# Patient Record
Sex: Female | Born: 1945 | Race: White | Hispanic: No | Marital: Married | Smoking: Never smoker
Health system: Southern US, Community
[De-identification: ages and names within clinical notes are randomized; demographics above are authoritative.]

## PROBLEM LIST (undated history)

## (undated) DIAGNOSIS — I313 Pericardial effusion (noninflammatory): Secondary | ICD-10-CM

## (undated) DIAGNOSIS — N390 Urinary tract infection, site not specified: Secondary | ICD-10-CM

## (undated) DIAGNOSIS — R112 Nausea with vomiting, unspecified: Secondary | ICD-10-CM

## (undated) DIAGNOSIS — E559 Vitamin D deficiency, unspecified: Secondary | ICD-10-CM

## (undated) DIAGNOSIS — M810 Age-related osteoporosis without current pathological fracture: Secondary | ICD-10-CM

## (undated) DIAGNOSIS — E782 Mixed hyperlipidemia: Secondary | ICD-10-CM

## (undated) DIAGNOSIS — E039 Hypothyroidism, unspecified: Secondary | ICD-10-CM

## (undated) DIAGNOSIS — Z9889 Other specified postprocedural states: Secondary | ICD-10-CM

## (undated) DIAGNOSIS — M1990 Unspecified osteoarthritis, unspecified site: Secondary | ICD-10-CM

## (undated) DIAGNOSIS — F419 Anxiety disorder, unspecified: Secondary | ICD-10-CM

## (undated) DIAGNOSIS — J189 Pneumonia, unspecified organism: Secondary | ICD-10-CM

## (undated) DIAGNOSIS — C9210 Chronic myeloid leukemia, BCR/ABL-positive, not having achieved remission: Secondary | ICD-10-CM

## (undated) DIAGNOSIS — R9431 Abnormal electrocardiogram [ECG] [EKG]: Secondary | ICD-10-CM

## (undated) DIAGNOSIS — Z8711 Personal history of peptic ulcer disease: Secondary | ICD-10-CM

## (undated) DIAGNOSIS — I3139 Other pericardial effusion (noninflammatory): Secondary | ICD-10-CM

## (undated) DIAGNOSIS — D72829 Elevated white blood cell count, unspecified: Secondary | ICD-10-CM

## (undated) DIAGNOSIS — J90 Pleural effusion, not elsewhere classified: Secondary | ICD-10-CM

## (undated) DIAGNOSIS — R32 Unspecified urinary incontinence: Secondary | ICD-10-CM

## (undated) HISTORY — PX: PARTIAL HYSTERECTOMY: SHX80

## (undated) HISTORY — DX: Mixed hyperlipidemia: E78.2

## (undated) HISTORY — DX: Pericardial effusion (noninflammatory): I31.3

## (undated) HISTORY — DX: Hypothyroidism, unspecified: E03.9

## (undated) HISTORY — DX: Age-related osteoporosis without current pathological fracture: M81.0

## (undated) HISTORY — DX: Vitamin D deficiency, unspecified: E55.9

## (undated) HISTORY — DX: Urinary tract infection, site not specified: N39.0

## (undated) HISTORY — DX: Pleural effusion, not elsewhere classified: J90

## (undated) HISTORY — DX: Chronic myeloid leukemia, BCR/ABL-positive, not having achieved remission: C92.10

## (undated) HISTORY — DX: Personal history of peptic ulcer disease: Z87.11

## (undated) HISTORY — PX: LASIK: SHX215

## (undated) HISTORY — DX: Elevated white blood cell count, unspecified: D72.829

## (undated) HISTORY — DX: Abnormal electrocardiogram (ECG) (EKG): R94.31

## (undated) HISTORY — DX: Other pericardial effusion (noninflammatory): I31.39

## (undated) HISTORY — DX: Anxiety disorder, unspecified: F41.9

## (undated) HISTORY — PX: NOSE SURGERY: SHX723

---
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
## 1999-11-27 ENCOUNTER — Encounter: Payer: Self-pay | Admitting: Obstetrics and Gynecology

## 1999-11-27 ENCOUNTER — Encounter: Admission: RE | Admit: 1999-11-27 | Discharge: 1999-11-27 | Payer: Self-pay | Admitting: Obstetrics and Gynecology

## 1999-12-18 ENCOUNTER — Encounter: Payer: Self-pay | Admitting: Obstetrics and Gynecology

## 1999-12-18 ENCOUNTER — Encounter: Admission: RE | Admit: 1999-12-18 | Discharge: 1999-12-18 | Payer: Self-pay | Admitting: Obstetrics and Gynecology

## 2000-12-30 ENCOUNTER — Encounter: Admission: RE | Admit: 2000-12-30 | Discharge: 2000-12-30 | Payer: Self-pay | Admitting: Obstetrics and Gynecology

## 2000-12-30 ENCOUNTER — Encounter: Payer: Self-pay | Admitting: Obstetrics and Gynecology

## 2001-05-07 ENCOUNTER — Emergency Department (HOSPITAL_COMMUNITY): Admission: EM | Admit: 2001-05-07 | Discharge: 2001-05-07 | Payer: Self-pay

## 2002-01-25 ENCOUNTER — Encounter: Admission: RE | Admit: 2002-01-25 | Discharge: 2002-01-25 | Payer: Self-pay | Admitting: Obstetrics and Gynecology

## 2002-01-25 ENCOUNTER — Encounter: Payer: Self-pay | Admitting: Obstetrics and Gynecology

## 2003-02-25 ENCOUNTER — Encounter: Admission: RE | Admit: 2003-02-25 | Discharge: 2003-02-25 | Payer: Self-pay | Admitting: Obstetrics and Gynecology

## 2003-02-25 ENCOUNTER — Encounter: Payer: Self-pay | Admitting: Obstetrics and Gynecology

## 2004-01-14 HISTORY — PX: COLONOSCOPY: SHX174

## 2004-01-17 ENCOUNTER — Ambulatory Visit (HOSPITAL_COMMUNITY): Admission: RE | Admit: 2004-01-17 | Discharge: 2004-01-17 | Payer: Self-pay | Admitting: Internal Medicine

## 2004-04-07 ENCOUNTER — Ambulatory Visit (HOSPITAL_COMMUNITY): Admission: RE | Admit: 2004-04-07 | Discharge: 2004-04-07 | Payer: Self-pay | Admitting: Obstetrics and Gynecology

## 2005-04-20 ENCOUNTER — Ambulatory Visit (HOSPITAL_COMMUNITY): Admission: RE | Admit: 2005-04-20 | Discharge: 2005-04-20 | Payer: Self-pay | Admitting: Obstetrics and Gynecology

## 2006-04-26 ENCOUNTER — Ambulatory Visit (HOSPITAL_COMMUNITY): Admission: RE | Admit: 2006-04-26 | Discharge: 2006-04-26 | Payer: Self-pay | Admitting: Obstetrics and Gynecology

## 2007-05-22 ENCOUNTER — Ambulatory Visit (HOSPITAL_COMMUNITY): Admission: RE | Admit: 2007-05-22 | Discharge: 2007-05-22 | Payer: Self-pay | Admitting: Obstetrics and Gynecology

## 2007-08-10 ENCOUNTER — Ambulatory Visit (HOSPITAL_COMMUNITY): Admission: RE | Admit: 2007-08-10 | Discharge: 2007-08-10 | Payer: Self-pay | Admitting: Obstetrics and Gynecology

## 2007-09-22 ENCOUNTER — Emergency Department (HOSPITAL_COMMUNITY): Admission: EM | Admit: 2007-09-22 | Discharge: 2007-09-22 | Payer: Self-pay | Admitting: Emergency Medicine

## 2008-05-23 ENCOUNTER — Ambulatory Visit (HOSPITAL_COMMUNITY): Admission: RE | Admit: 2008-05-23 | Discharge: 2008-05-23 | Payer: Self-pay | Admitting: Obstetrics and Gynecology

## 2008-07-29 ENCOUNTER — Ambulatory Visit (HOSPITAL_COMMUNITY): Admission: RE | Admit: 2008-07-29 | Discharge: 2008-07-29 | Payer: Self-pay | Admitting: Family Medicine

## 2008-11-05 ENCOUNTER — Ambulatory Visit (HOSPITAL_COMMUNITY): Admission: RE | Admit: 2008-11-05 | Discharge: 2008-11-05 | Payer: Self-pay | Admitting: Pulmonary Disease

## 2009-05-27 ENCOUNTER — Ambulatory Visit (HOSPITAL_COMMUNITY): Admission: RE | Admit: 2009-05-27 | Discharge: 2009-05-27 | Payer: Self-pay | Admitting: Obstetrics and Gynecology

## 2009-08-13 ENCOUNTER — Ambulatory Visit (HOSPITAL_COMMUNITY): Admission: RE | Admit: 2009-08-13 | Discharge: 2009-08-13 | Payer: Self-pay | Admitting: Internal Medicine

## 2010-01-12 ENCOUNTER — Ambulatory Visit (HOSPITAL_COMMUNITY): Admission: RE | Admit: 2010-01-12 | Discharge: 2010-01-12 | Payer: Self-pay | Admitting: Pulmonary Disease

## 2010-01-27 ENCOUNTER — Encounter (HOSPITAL_COMMUNITY): Admission: RE | Admit: 2010-01-27 | Discharge: 2010-02-26 | Payer: Self-pay | Admitting: Pulmonary Disease

## 2010-02-10 HISTORY — PX: ESOPHAGOGASTRODUODENOSCOPY: SHX1529

## 2010-02-11 DIAGNOSIS — R110 Nausea: Secondary | ICD-10-CM

## 2010-02-11 DIAGNOSIS — Z87448 Personal history of other diseases of urinary system: Secondary | ICD-10-CM | POA: Insufficient documentation

## 2010-02-11 DIAGNOSIS — R1084 Generalized abdominal pain: Secondary | ICD-10-CM

## 2010-02-11 DIAGNOSIS — R109 Unspecified abdominal pain: Secondary | ICD-10-CM | POA: Insufficient documentation

## 2010-02-11 DIAGNOSIS — E039 Hypothyroidism, unspecified: Secondary | ICD-10-CM | POA: Insufficient documentation

## 2010-02-11 DIAGNOSIS — R112 Nausea with vomiting, unspecified: Secondary | ICD-10-CM | POA: Insufficient documentation

## 2010-02-12 ENCOUNTER — Ambulatory Visit: Payer: Self-pay | Admitting: Gastroenterology

## 2010-02-12 DIAGNOSIS — R1013 Epigastric pain: Secondary | ICD-10-CM | POA: Insufficient documentation

## 2010-02-12 DIAGNOSIS — R1011 Right upper quadrant pain: Secondary | ICD-10-CM | POA: Insufficient documentation

## 2010-02-13 ENCOUNTER — Ambulatory Visit (HOSPITAL_COMMUNITY): Admission: RE | Admit: 2010-02-13 | Discharge: 2010-02-13 | Payer: Self-pay | Admitting: Gastroenterology

## 2010-02-13 ENCOUNTER — Ambulatory Visit: Payer: Self-pay | Admitting: Gastroenterology

## 2010-02-17 ENCOUNTER — Telehealth (INDEPENDENT_AMBULATORY_CARE_PROVIDER_SITE_OTHER): Payer: Self-pay

## 2010-05-14 ENCOUNTER — Encounter: Payer: Self-pay | Admitting: Gastroenterology

## 2010-05-20 ENCOUNTER — Ambulatory Visit: Payer: Self-pay | Admitting: Gastroenterology

## 2010-05-20 DIAGNOSIS — K279 Peptic ulcer, site unspecified, unspecified as acute or chronic, without hemorrhage or perforation: Secondary | ICD-10-CM | POA: Insufficient documentation

## 2010-06-09 ENCOUNTER — Ambulatory Visit (HOSPITAL_COMMUNITY): Admission: RE | Admit: 2010-06-09 | Discharge: 2010-06-09 | Payer: Self-pay | Admitting: Obstetrics and Gynecology

## 2010-09-16 ENCOUNTER — Telehealth (INDEPENDENT_AMBULATORY_CARE_PROVIDER_SITE_OTHER): Payer: Self-pay

## 2010-11-17 ENCOUNTER — Ambulatory Visit: Payer: Self-pay | Admitting: Gastroenterology

## 2011-01-04 ENCOUNTER — Encounter: Payer: Self-pay | Admitting: Family Medicine

## 2011-01-14 NOTE — Progress Notes (Signed)
Summary: Pt says she can't take Nexium  Phone Note Call from Patient   Caller: Patient Summary of Call: Pt called and said she started Nexium, prescribed by Tana Coast, PA, last week. She took it Sat, Sun, Mon, and this AM. Each time she said she gets very jittery, has severe abd cramps and some nausea not long after taking. She feels certain this is coming from the Nexium. She also said [name] gave her Rx for Omeprazole and she is not sure if she should have it filled or not. She said she was told not to eat fried foods, no caffeine, but wasn't told what she can have. Please advise. Initial call taken by: Cloria Spring LPN,  February 17, 2010 9:29 AM     Appended Document: Pt says she can't take Nexium Stop Nexium. I would probably try different PPI than omeprazole (similar to Nexium). Offer samples of Dexilant to take one daily. If tolerated, then she needs to get RX filled (please call in Dexilant 60mg  by mouth 30 mins before breakfast daily #30 and 11 refills). Please provide her with diet for gastritis/ulcers.  Appended Document: Pt says she can't take Nexium Pt informed. Declined samples...Marland KitchenMarland Kitchentime-wise can't make it today and wants available to begin tomorrow. Rx called to [facility].Diet mailed.  Appended Document: Pt says she can't take Nexium Please call pt. Her biopsies show chronic irritation of her stomach. She should avoid gastric irritants. Otherwise she may eat what she likes. Take Dexilant daily indefinitely.  Appended Document: Pt says she can't take Nexium LMOM to call.  Appended Document: Pt says she can't take Nexium Pt  informed.

## 2011-01-14 NOTE — Assessment & Plan Note (Signed)
Summary: abd pain,nausea,glu   Visit Type:  Initial Consult Referring Provider:  Juanetta Martinez Primary Care Provider:  Juanetta Martinez  Chief Complaint:  abd pain/ nausea.  History of Present Illness: [name] is a pleasant 65 year old Caucasian female, patient of Dr. Juanetta Martinez, who presents for further evaluation of nausea and abdominal pain. She states she has been having symptoms for several months. Symptoms have been worse since January. Initially she noted pain in the right upper quadrant but now more in the epigastrium. She has nausea but no vomiting. The pain can occur without meals but usually related to meals. She describes a burning twisting-type epigastric pain. Denies any typical heartburn. No difficulty swallowing. Bowel movements normal for her which include occasional loose stools related to certain foods. Denies any blood in the stool or melena. She complains of a bitter taste in her mouth. The right upper quadrant pain is mostly in the right flank/right lower rib cage margin. It is worse with movement. She denies any hematuria. She has had history of multiple UTIs. Denies any recent dysuria.  Abdominal ultrasound recently was unremarkable. HIDA scan showed gallbladder ejection fraction of 75%.  Denies NSAID use. No recent PPIs. She has fear of endoscopy, complains of nausea and vomiting related to anesthesia and fasting.  Current Medications (verified): 1)  Estradiol 0.5 Mg Tabs (Estradiol) .... Daily 2)  Calcium 600 Mg Plus D .... Take 1 Tablet By Mouth Two Times A Day 3)  Tums .... As Needed  Allergies (verified): 1)  ! Codeine 2)  ! Sulfa  Past History:  Past Medical History: Colonoscopy, 2/05, Dr. Karilyn Martinez --> few small diverticula at sigmoid colon  Hypothyroidism, not on medications for two years, followed by Dr. Germaine Ripp Martinez   Past Surgical History: Partial hysterectomy  Family History: No FH of CRC, liver, chronic GI illnesses Father, deceased, lung cancer Mother,  COPD  Social History: Married. Two children. Retired. Never smoked. No alcohol. No drug.  Review of Systems General:  Denies fever, chills, sweats, anorexia, and weight loss. Eyes:  Denies vision loss. ENT:  Complains of nasal congestion; denies sore throat, hoarseness, and difficulty swallowing. CV:  Denies chest pains, angina, palpitations, dyspnea on exertion, and peripheral edema. Resp:  Denies dyspnea at rest, dyspnea with exercise, cough, and sputum. GI:  See HPI. GU:  Denies urinary burning and blood in urine. MS:  Denies joint pain / LOM. Derm:  Denies rash and itching. Neuro:  Denies weakness, paralysis, frequent headaches, memory loss, and confusion. Psych:  Denies depression and anxiety. Endo:  Denies unusual weight change. Heme:  Denies bruising and bleeding. Allergy:  Denies hives and rash.  Vital Signs:  Patient profile:   65 year old female Height:      67 inches Weight:      216 pounds BMI:     33.95 Temp:     97.7 degrees F oral Pulse rate:   64 / minute BP sitting:   142 / 80  (left arm) Cuff size:   large  Vitals Entered By: Andrea Martinez (February 12, 1609 2:27 PM)  Serial Vital Signs/Assessments:  Time      Position  BP       Pulse  Resp  Temp     By 3:27 PM             128/78                         Andrea Battles.  Dixon Martinez   Physical Exam  General:  Well developed, well nourished, no acute distress. Head:  Normocephalic and atraumatic. Eyes:  Conjunctivae pink, no scleral icterus.  Mouth:  Oropharyngeal mucosa moist, pink.  No lesions, erythema or exudate.    Neck:  Supple; no masses or thyromegaly. Lungs:  Clear throughout to auscultation. Heart:  Regular rate and rhythm; no murmurs, rubs,  or bruits. Abdomen:  Bowel sounds normal.  Abdomen is soft, obese. Moderate epigastric tenderness.  No rebound or guarding.  She also has tenderness with palpation of right lower ribcage. No hepatosplenomegaly, masses or hernias.  No abdominal bruits.   Extremities:  No clubbing, cyanosis, edema or deformities noted. Neurologic:  Alert and  oriented x4;  grossly normal neurologically. Skin:  Intact without significant lesions or rashes. Cervical Nodes:  No significant cervical adenopathy. Psych:  Alert and cooperative. Normal mood and affect.  Impression & Recommendations:  Problem # 1:  EPIGASTRIC PAIN (ICD-789.06)  Several month h/o epig pain, nausea. GB w/u negative. No typical heartburn symptoms. Ddx includes atypical GERD, gastritis, PUD, dyspepsia. EGD tomorrow. Patient c/o N/V related to anesthesia. Consider preop antiemetic. EGD to be performed in near future.  Risks, alternatives, benefits including but not limited to risk of reaction to medications, bleeding, infection, and perforation addressed.  Patient voiced understanding and verbal consent obtained.  Begin Nexium 40mg  by mouth daily #20 samples provided.      Orders: Consultation Level III (16109)  Problem # 2:  ABDOMINAL PAIN RIGHT UPPER QUADRANT (ICD-789.01)  Really Right lower rib cage margin pain. ?musculoskeletal.  Orders: Consultation Level III (60454)   I would like to thank Dr. Juanetta Martinez for allowing [place] to take part in the care of this nice patient.  Appended Document: abd pain,nausea,glu JAN 2011 ABD US-NL LIVER AND GB

## 2011-01-14 NOTE — Medication Information (Signed)
Summary: Tax adviser   Imported By: Diana Eves 05/14/2010 13:24:02  _____________________________________________________________________  External Attachment:    Type:   Image     Comment:   External Document  Appended Document: RX Folder What does this mean? She was given year supply in 3/11.  Appended Document: RX Water quality scientist with [employer]. They had recorded 2 refills instead of 11. He is adding number 9 more.

## 2011-01-14 NOTE — Assessment & Plan Note (Signed)
Summary: PUD   Visit Type:  Follow-up Visit [facility]:  Juanetta Gosling, M.D.  Chief Complaint:  PUD.  History of Present Illness: Can take something mild for indigestion like TUMS? One mo ago having problems-UTIs. Saw a Urologist-Rx:Abx. Saw Dr. Elana Alm Elmiron/TMP. Took Dexilant and no pain on right side. Rare twinge onthe right side. No doubling over belly pain. Thinks Elmiron keeps her awake due to need to drinking so much water.   No black stool or BRBPR.  Current Medications (verified): 1)  Estradiol 0.5 Mg Tabs (Estradiol) .... Daily 2)  Calcium 600 Mg Plus D .... Take 1 Tablet By Mouth Two Times A Day 3)  Tylenol .... As Needed 4)  Vitamin D 2000iu .... Take 1 Tablet By Mouth Once A Day 5)  Trimethoprim 100 Mg Tabs (Trimethoprim) .... Take Two Tablets Daily 6)  Elmiron 100 Mg Caps (Pentosan Polysulfate Sodium) .... Take 1 Tablet By Mouth Three Times A Day  Allergies (verified): 1)  ! Codeine 2)  ! Sulfa 3)  ! * Nexium  Past History:  Past Medical History: GASTRIC ULCERS EGD MAR 2011 Colonoscopy, 2/05, Dr. Karilyn Cota **few small diverticula at sigmoid colon Hypothyroidism, not on medications for two years, followed by Dr. [name]   Past Surgical History: Reviewed history from 02/12/2010 and no changes required. Partial hysterectomy  Vital Signs:  Patient profile:   65 year old female Height:      67 inches Weight:      177 pounds BMI:     27.82 Temp:     97.8 degrees F oral Pulse rate:   80 / minute BP sitting:   118 / 68  (left arm)  Vitals Entered By: Cloria Spring LPN (November 17, 2010 8:54 AM)  Physical Exam  General:  Well developed, well nourished, no acute distress. Head:  Normocephalic and atraumatic. Lungs:  Clear throughout to auscultation. Heart:  Regular rate and rhythm; no murmurs. Abdomen:  Soft, nontender and nondistended. Normal bowel sounds.  Impression & Recommendations:  Problem # 1:  PUD (ICD-533.90) Assessment  Improved Use Dexilant as needed. Use TUMS 30 minutes after meals. OPV IN 6-12 MOS.  CC: PCP  Appended Document: PUD 6-12 MON F/U OPV IS IN THE COMPUTER  Appended Document: Orders Update    Clinical Lists Changes  Orders: Added new Service order of Est. Patient Level II (16109) - Signed

## 2011-01-14 NOTE — Progress Notes (Signed)
Summary: phone note/ right side pain  Phone Note Call from Patient   Caller: Patient Summary of Call: Pt called and said she has been having some pain on her right side since yesterday. Sometimes it is severe. She had EGD done in 02/2010. She said [name] told her she could stop taking the Dexilant if she felt like they were no longer doing any good. She last took one on 08/22/2010. Had been doing well until the last couple of days. She is also on antibiotics for UTI from Dr. Juanetta Gosling. She has a very bitter taste in her mouth that she has had for a couple of days. I told her [name] is not available today. I will send message to [place] but since it is late i might not hear back from him until tomorrow. I advised her to speak with Dr. Juanetta Gosling if it is very bad and of course if pain becomes severe to go to the ED.  Initial call taken by: Cloria Spring LPN,  September 16, 2010 3:54 PM     Appended Document: phone note/ right side pain Would resume Dexlant 60 mg daily x 2 weeks only (prescription ok if needeed) and see if this helps; howver, if pain worsens would get in touch with Dr. Juanetta Gosling or go to ED  Appended Document: phone note/ right side pain Pt was informed.   Appended Document: PUD 6-12 MON F/U OPV IS IN THE COMPUTER

## 2011-01-14 NOTE — Assessment & Plan Note (Signed)
Summary: PUD, RIGHT RIB PAIN   Visit Type:  Follow-up Visit Primary Care Provider:  Juanetta Gosling, M.D.  Chief Complaint:  F/U procedure.  History of Present Illness: Feels discomfort uner her right rib. Thinks she passed a kidney stone about 2 weeks. Having urinary frequency and hurting in fronts of legs. No diagnosis w/ kidney stone. Been evaluated for GB problems. Dexilant is $85 co-pay. Lost 26 lbs since [facility] 2011. Has episodes <1x/week. Achy on right rib cage side and pain in legs. Sx last less than an hour. No heavy lifting, house work, or gardening. No car accident. 5-6 weeks ago: coughing a lot, almost to the point of heaving. No pain in front of abdomen. Eating grilled, baked sweet potato, green beans, and oatmeal.   Current Medications (verified): 1)  Estradiol 0.5 Mg Tabs (Estradiol) .... Daily 2)  Calcium 600 Mg Plus D .... Take 1 Tablet By Mouth Two Times A Day 3)  Tylenol .... As Needed 4)  Dexilant 60 Mg Cpdr (Dexlansoprazole) .... Take 1 Tablet By Mouth Once A Day  Allergies (verified): 1)  ! Codeine 2)  ! Sulfa 3)  ! * Nexium  Past History:  Past Medical History: GASTRI ULCERS EGD MAR 2011 Colonoscopy, 2/05, Dr. Karilyn Cota **few small diverticula at sigmoid colon Hypothyroidism, not on medications for two years, followed by Dr. [name]   Vital Signs:  Patient profile:   65 year old female Height:      67 inches Weight:      190 pounds BMI:     29.87 Temp:     98.5 degrees F oral Pulse rate:   72 / minute BP sitting:   140 / 82  (left arm) Cuff size:   regular  Vitals Entered By: Cloria Spring LPN (May 21, 4539 2:09 PM)  Physical Exam  General:  Well developed, well nourished, no acute distress. Head:  Normocephalic and atraumatic. Lungs:  Clear throughout to auscultation. Heart:  Regular rate and rhythm; no murmurs. Abdomen:  Soft, nontender and nondistended. Normal bowel sounds. Msk:  TTP over right rib cage. Reproduces complaint. Extremities:  No edema or  deformities noted.  Impression & Recommendations:  Problem # 1:  PUD (ICD-533.90) Assessment Improved  w/ weight loss due to diet modification and stress. Consider pain management and chest wall injections if chest wall pain persists. Continue Dexilant for 3 months then prn. Rx assistance card given. #30 Dexilant given. OPV i 6 mos. Pt may liberalize diet. Explained a weight of 190 lbs is a good weight for her.  CC: PCP  Orders: Est. Patient Level II ([id number])  Problem # 2:  SCREENING, COLON CANCER (ICD-V76.51) Assessment: Comment Only TCS in 2015.  Appended Document: PUD, RIGHT RIB PAIN REMINDER IN COMPUTER

## 2011-01-14 NOTE — Letter (Signed)
Summary: EGD ORDER  EGD ORDER   Imported By: Ave Filter 02/12/2010 16:00:50  _____________________________________________________________________  External Attachment:    Type:   Image     Comment:   External Document

## 2011-03-10 ENCOUNTER — Encounter: Payer: Self-pay | Admitting: Gastroenterology

## 2011-03-10 ENCOUNTER — Ambulatory Visit (INDEPENDENT_AMBULATORY_CARE_PROVIDER_SITE_OTHER): Payer: Self-pay | Admitting: Gastroenterology

## 2011-03-10 VITALS — BP 138/76 | HR 76 | Temp 97.2°F | Ht 66.0 in | Wt 178.5 lb

## 2011-03-10 DIAGNOSIS — R1011 Right upper quadrant pain: Secondary | ICD-10-CM

## 2011-03-10 NOTE — Progress Notes (Signed)
Reviewed by R. Michael Mairin Lindsley, MD FACP FACG 

## 2011-03-10 NOTE — Patient Instructions (Signed)
#  1 please call us tomorrow with a progress report. If you develop worsening abdominal pain, vomiting, fever should call us immediately or go to the nearest ER. #2 if your pain is persistent would recommend a CT of the abdomen and pelvis with contrast. #3 if you develop a rash, please contact her PCP for treatment of shingles. #4 continue your Dexilant 60 mg daily. We do not have samples at this time he may contact her office next week to obtain more samples.

## 2011-03-10 NOTE — Progress Notes (Signed)
Primary Care Physician:  Fredirick Maudlin, MD  Chief Complaint  Patient presents with  . Abdominal Pain    RUQ, hurts worse 2 hours after meal. feels like knife twisting in side.     HPI:  Andrea Martinez is a 65 y.o. female here for urgent visit. This weekend severe RUQ pain. Never this bad before. Avoiding spicy foods and gastric irritants since dx with PUD in 5/11. Started Dexilant again two weeks ago. Took for 3 months before but was advised she could stop in last 12/11. Still with real bitter taste in mouth. Different pain from when had ulcers. Nausea. BM normal, sometimes looser (with increased fiber). No melena, brbpr. No NSAIDS. Taking tylenol for the pain. No fever, sob, cough, congestion. No rash. No dysuria, hematuria. No UTIs in last two months. She has lost 50 lbs in last one year, intentionally.  Negative gb w/u in 2/11.   Past Medical History  Diagnosis Date  . Gastric ulcer 2011    EGD, 5/11  . Hypothyroidism     not on meds, followed by Dr. Payam Gribble Dales    Past Surgical History  Procedure Date  . Esophagogastroduodenoscopy 02/2010    gastric ulcers  . Colonoscopy 01/2004    [facility], few small tics  . Partial hysterectomy     Current Outpatient Prescriptions  Medication Sig Dispense Refill  . Calcium-Vitamin D (CALTRATE 600 PLUS-VIT D PO) Take 2 capsules by mouth daily.        . Cholecalciferol (VITAMIN D) 2000 UNITS CAPS Take 1 capsule by mouth daily.        Marland Kitchen dexlansoprazole (DEXILANT) 60 MG capsule Take 60 mg by mouth daily as needed.        Marland Kitchen estradiol (ESTRACE) 0.5 MG tablet Take 0.5 mg by mouth daily.                       Allergies as of 03/10/2011 - Review Complete 03/10/2011  Allergen Reaction Noted  . Codeine  02/11/2010  . Esomeprazole magnesium  05/20/2010  . Sulfonamide derivatives  02/11/2010    Family History  Problem Relation Age of Onset  . Lung cancer Father   . COPD Mother   . Colon cancer Neg Hx     History   Social History  .  Marital Status: Married    Spouse Name: N/A    Number of Children: 2  . Years of Education: N/A   Occupational History  . retired    Social History Main Topics  . Smoking status: Never Smoker   . Smokeless tobacco: Never Used  . Alcohol Use: No  . Drug Use: No  . Sexually Active: Yes -- Female partner(s)    Birth Control/ Protection: None     Review of Systems: Gen: Denies any fever, chills, sweats, anorexia, fatigue, weakness, malaise, unintentional weight loss, and sleep disorder CV: Denies chest pain, angina, palpitations, syncope, orthopnea, PND, peripheral edema, and claudication. Resp: Denies dyspnea at rest, dyspnea with exercise, cough, sputum, wheezing, coughing up blood, and pleurisy. GI: see HPI GU : Denies urinary burning, blood in urine, urinary frequency, urinary hesitancy, nocturnal urination, and urinary incontinence. Derm: Denies rash, itching.    Physical Exam: BP 138/76  Pulse 76  Temp 97.2 F (36.2 C)  Ht 5\' 6"  (1.676 m)  Wt 178 lb 8 oz (80.967 kg)  BMI 28.81 kg/m2 General:   Alert,  Well-developed, well-nourished, pleasant and cooperative in NAD Head:  Normocephalic and  atraumatic. Eyes:  Sclera clear, no icterus.   Conjunctiva pink. Nose:  No deformity, discharge,  or lesions. Mouth:  No deformity or lesions, dentition normal. Neck:  Supple; no masses or thyromegaly. Lungs:  Clear throughout to auscultation.   No wheezes, crackles, or rhonchi. No acute distress. Heart:  Regular rate and rhythm; no murmurs, clicks, rubs,  or gallops. Abdomen:  Soft, mild tenderness above right iliac crest and right flank, over right lower ribcage. Couple of small red spots but no obvious rash. Nondistended. No masses, hepatosplenomegaly or hernias noted. Normal bowel sounds, without guarding, and without rebound.     Msk:  Symmetrical without gross deformities. Normal posture. Extremities:  Without clubbing or edema. Neurologic:  Alert and  oriented x4;  grossly normal  neurologically. Skin:  Intact without significant lesions or rashes. Psych:  Alert and cooperative. Normal mood and affect.

## 2011-03-10 NOTE — Assessment & Plan Note (Signed)
Patient states this pain is different than before although based on her description it seems very similar to what she had last year. She states she never experienced anything this severe before however.. Sometimes related to food. Definitely worse with movement and applying pressure to the area. She has a history of peptic ulcer disease last year when she was experiencing some his pain as well. She has previously been felt to have chest wall pain and [name] has considered sending her for injections. Gallbladder workup one year ago was negative. Mild improvement since resuming Dexilant 2 weeks ago. Couple of small erythematous areas noted in the area (under bra) and this may very well be the beginnings of shingles however no obvious rash at this point. She is to continue to watch for development of rash and if she does develop a rash she should contact her PCP for management. I offered her CT the abdomen and pelvis with contrast at this point she would like to hold off. She plans to call tomorrow with a progress report. In the meantime if she develops fever, increased pain or vomiting she should let us know or go to the nearest emergency department. She'll continue Dexilant for now. We were out of samples but she may call next week for some samples if needed.

## 2011-03-11 ENCOUNTER — Telehealth: Payer: Self-pay

## 2011-03-11 NOTE — Telephone Encounter (Signed)
Noted. Patient will call if persisting abdominal pain and at that time we'll consider further workup via CT. Otherwise office visit as planned in 6 months.

## 2011-03-11 NOTE — Telephone Encounter (Signed)
OV was cx for 03/19/11 per pt request and 6 months fu is on recall to fu in Sept. 2012

## 2011-03-11 NOTE — Telephone Encounter (Signed)
Pt called with progress report. She stated her pain was not any worse. She does not want CT right now and wants to cancel her OV appt on April 9th. She also stated there were 2 medications on her AVM that she wasn't taking anymore and asked that I take them off. I have marked them as not taking in this update. Pt wanted to know if she still needed to come in to see SLF in June. Per LSL pt needs to come back from now. Pt stated she may need refill on her dexilant before then. Asked pt to call pharmacy when she needed refill.

## 2011-03-15 ENCOUNTER — Ambulatory Visit: Payer: Self-pay | Admitting: Gastroenterology

## 2011-03-17 ENCOUNTER — Telehealth: Payer: Self-pay

## 2011-03-17 NOTE — Telephone Encounter (Signed)
Pt called for samples of Dexilant (was told to call this week since we did not have samples when she was in the office last week). #15 at front for pick-up.

## 2011-03-17 NOTE — Telephone Encounter (Signed)
okay

## 2011-03-22 ENCOUNTER — Ambulatory Visit: Payer: Self-pay | Admitting: Gastroenterology

## 2011-04-30 NOTE — Op Note (Signed)
Andrea Martinez, Andrea Martinez                           ACCOUNT NO.:  [id number]   MEDICAL RECORD NO.:  [id number]                   PATIENT TYPE:  AMB   LOCATION:  DAY                                  FACILITY:  [facility]   PHYSICIAN:  Lionel December, M.D.                 DATE OF BIRTH:  [birth year]   DATE OF PROCEDURE:  01/17/2004  DATE OF DISCHARGE:                                 OPERATIVE REPORT   PROCEDURE:  Total colonoscopy.   ENDOSCOPIST:  Lionel December, M.D.   INDICATIONS:  Andrea Martinez is a 64 year old Caucasian female who was recently noted  to have heme-positive stool.  She has no GI symptoms.  Family history is  negative for colorectal carcinoma.  She is undergoing diagnostic  colonoscopy.  The procedure and risks were reviewed with the patient and  informed consent was obtained.   PREOPERATIVE MEDICATIONS:  Demerol 50 mg IV and Versed 6 mg IV in divided  dose.   FINDINGS:  Procedure performed in endoscopy suite.  The patient's vital  signs and O2 saturation were monitored during the procedure and remained  stable.  The patient was placed in the left lateral recumbent position and  rectal examination was performed.  No abnormality noted on external or  digital exam.   Olympus videoscope was placed in the rectum and advanced under vision into  the sigmoid colon where there were a few small diverticula.  The scope was  advanced into cecum.  Preparation was excellent except she had some stool  coating of her cecum which was washed away.  Cecal landmarks i.e.  appendiceal orifice and ileocecal valve were well seen; and pictures were  taken for the record.  As the scope was withdrawn colonic mucosa was  carefully examined and was normal throughout.  The rectal mucosa similarly  was normal.   The scope was retroflexed to examine the anorectal junction which was  unremarkable.  The endoscope was straightened and withdrawn.  The patient  tolerated the procedure well.   FINAL DIAGNOSIS:  A few  small diverticula at the sigmoid colon; otherwise  normal examination.   RECOMMENDATIONS:  1. High fiber diet, plus Citrucel 1 tablespoonful daily.  2. She will have her Hemoccult and H&H on her next visit with her physician     in 3 months.  3. Unless her stool is noted to be heme-positive or she has low H&H she will     not need any further evaluation.  4. She was given a prescription for Anusol HC cream that she could use on a     p.r.n. basis for anal, perianal irritation.      ___________________________________________  Lionel December, M.D.   NR/MEDQ  D:  01/17/2004  T:  01/17/2004  Job:  [id number]   cc:   S. Kyra Manges, M.D.  [phone] [address]  [place]  [place] [address]  Fax: [phone]   Gaynelle Cage, MD  [phone] [address]  [place]  [place] [address]  Fax: [phone]

## 2011-07-08 ENCOUNTER — Encounter: Payer: Self-pay | Admitting: Orthopedic Surgery

## 2011-07-08 ENCOUNTER — Ambulatory Visit (INDEPENDENT_AMBULATORY_CARE_PROVIDER_SITE_OTHER): Payer: Medicare Other | Admitting: Orthopedic Surgery

## 2011-07-08 VITALS — Ht 65.0 in | Wt 176.0 lb

## 2011-07-08 DIAGNOSIS — R2230 Localized swelling, mass and lump, unspecified upper limb: Secondary | ICD-10-CM | POA: Insufficient documentation

## 2011-07-08 DIAGNOSIS — R229 Localized swelling, mass and lump, unspecified: Secondary | ICD-10-CM

## 2011-07-08 NOTE — Patient Instructions (Signed)
Rest  Limit activity

## 2011-07-08 NOTE — Progress Notes (Signed)
Consult requested by Dr. Kari Baars  Reason for consultation "lipoma LEFT biceps".  This is a 65 year old female who has had a mass over her LEFT arm for approximately 2 weeks which started gradually and was associated with no injuries and has not been previously treated.  This is painful for her complaining of throbbing 7/10 pain which is intermittent and worse at night and after exercise.  She complains of some numbness and tingling in her upper cervical area with this and pain with forward and overhead elevation  She doesn't improve with rest.  She complains of some swelling.  She has a history of heartburn no chest pain shortness of breath or frequency no numbness tingling.  She does have some seasonal ALLERGIES.  Medical problems include thyroid disease and peptic ulcer disease.  She's had a partial hysterectomy and nasal surgery.  Family history of lung disease.  Vital signs are stable as recorded  General appearance is normal  The patient is alert and oriented x3  The patient's mood and affect are normal  Gait assessment: Normal The cardiovascular exam reveals normal pulses and temperature without edema swelling.  The lymphatic system is negative for palpable lymph nodes  The sensory exam is normal.  There are no pathologic reflexes.  Balance is normal.   Exam of the LEFT upper extremity shows a large mass over the biceps which is at least 12 x 6 cm.  It appears to be soft non-pulsatile.  Tender to palpation.  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  Soft tissue mass seen.The greater tuberosity sclerosis.    Impression is Normal humerus, questionable chronic rotator cuff disease

## 2011-07-14 ENCOUNTER — Telehealth: Payer: Self-pay | Admitting: Radiology

## 2011-07-14 ENCOUNTER — Other Ambulatory Visit: Payer: Self-pay | Admitting: *Deleted

## 2011-07-14 ENCOUNTER — Other Ambulatory Visit: Payer: Self-pay | Admitting: Orthopedic Surgery

## 2011-07-14 DIAGNOSIS — R2230 Localized swelling, mass and lump, unspecified upper limb: Secondary | ICD-10-CM

## 2011-07-14 NOTE — Telephone Encounter (Signed)
I called the patient to give her MRI appointment at [facility] on 07-16-11 at 12:30.Patient has [facility], authorization # [id number] and it expires on 08-28-11. She will follow up back at the office for her results.

## 2011-07-16 ENCOUNTER — Telehealth: Payer: Self-pay | Admitting: Gastroenterology

## 2011-07-16 ENCOUNTER — Ambulatory Visit (HOSPITAL_COMMUNITY)
Admission: RE | Admit: 2011-07-16 | Discharge: 2011-07-16 | Disposition: A | Discharge status: Home / Self Care | Payer: Medicare Other | Source: Ambulatory Visit | Attending: Orthopedic Surgery | Admitting: Orthopedic Surgery

## 2011-07-16 DIAGNOSIS — D1739 Benign lipomatous neoplasm of skin and subcutaneous tissue of other sites: Secondary | ICD-10-CM | POA: Insufficient documentation

## 2011-07-16 DIAGNOSIS — M79609 Pain in unspecified limb: Secondary | ICD-10-CM | POA: Insufficient documentation

## 2011-07-16 DIAGNOSIS — M799 Soft tissue disorder, unspecified: Secondary | ICD-10-CM | POA: Insufficient documentation

## 2011-07-16 DIAGNOSIS — R209 Unspecified disturbances of skin sensation: Secondary | ICD-10-CM | POA: Insufficient documentation

## 2011-07-16 NOTE — Telephone Encounter (Signed)
  Routing to Rx box to get signed off on.

## 2011-07-19 NOTE — Telephone Encounter (Signed)
Samples OK 

## 2011-07-27 ENCOUNTER — Encounter: Payer: Self-pay | Admitting: Orthopedic Surgery

## 2011-07-27 ENCOUNTER — Ambulatory Visit (INDEPENDENT_AMBULATORY_CARE_PROVIDER_SITE_OTHER): Payer: Medicare Other | Admitting: Orthopedic Surgery

## 2011-07-27 DIAGNOSIS — D179 Benign lipomatous neoplasm, unspecified: Secondary | ICD-10-CM | POA: Insufficient documentation

## 2011-07-27 DIAGNOSIS — D1779 Benign lipomatous neoplasm of other sites: Secondary | ICD-10-CM

## 2011-07-27 DIAGNOSIS — D1720 Benign lipomatous neoplasm of skin and subcutaneous tissue of unspecified limb: Secondary | ICD-10-CM

## 2011-07-27 NOTE — Progress Notes (Signed)
This is a post imaging followup visit  Diagnosis Lipoma LEFT arm  Image MRI  Reading Large 10 x 8, benign-appearing lipoma, LEFT arm  I have viewed the image and the report, I agree with the report   The plan will be To refer the patient to Dr. Malvin Johns for evaluation and treatment

## 2011-07-28 MED ORDER — GADOBENATE DIMEGLUMINE 529 MG/ML IV SOLN: 15.0000 mL | Freq: Once | INTRAVENOUS | Status: AC | PRN

## 2011-07-29 ENCOUNTER — Encounter (HOSPITAL_COMMUNITY)
Admission: RE | Admit: 2011-07-29 | Discharge: 2011-07-29 | Disposition: A | Discharge status: Home / Self Care | Payer: Medicare Other | Source: Ambulatory Visit | Attending: General Surgery | Admitting: General Surgery

## 2011-07-29 ENCOUNTER — Encounter (HOSPITAL_COMMUNITY): Payer: Self-pay

## 2011-07-29 ENCOUNTER — Other Ambulatory Visit: Payer: Self-pay

## 2011-07-29 HISTORY — DX: Other specified postprocedural states: R11.2

## 2011-07-29 HISTORY — DX: Other specified postprocedural states: Z98.890

## 2011-07-29 LAB — BASIC METABOLIC PANEL
CO2: 26 mEq/L (ref 19–32)
Calcium: 10.2 mg/dL (ref 8.4–10.5)
GFR calc non Af Amer: >60 mL/min (ref >60)
Glucose, Bld: 92 mg/dL (ref 70–99)
Potassium: 4.2 mEq/L (ref 3.5–5.1)
Sodium: 140 mEq/L (ref 135–145)

## 2011-07-29 LAB — DIFFERENTIAL
Basophils Absolute: 0 10*3/uL (ref 0.0–0.1)
Eosinophils Absolute: 0 10*3/uL (ref 0.0–0.7)
Lymphocytes Relative: 24 % (ref 12–46)
Lymphs Abs: 1.6 10*3/uL (ref 0.7–4.0)
Neutrophils Relative %: 68 % (ref 43–77)

## 2011-07-29 LAB — CBC
Platelets: 232 10*3/uL (ref 150–400)
RBC: 4.53 MIL/uL (ref 3.87–5.11)
RDW: 13 % (ref 11.5–15.5)
WBC: 6.6 10*3/uL (ref 4.0–10.5)

## 2011-07-29 LAB — SURGICAL PCR SCREEN: MRSA, PCR: NEGATIVE

## 2011-07-29 MED ORDER — LACTATED RINGERS IV SOLN: INTRAVENOUS | Status: DC

## 2011-07-29 NOTE — Patient Instructions (Addendum)
[address] Andrea Martinez  07/29/2011   Your procedure is scheduled on:  08-02-2011  Report to [facility] a 615  AM.  Call this number if you have problems the morning of surgery: [phone]   Remember:   Do not eat food:After Midnight.  Do not drink clear liquids: After Midnight.  Take these medicines the morning of surgery with A SIP OF WATER: Dexilant   Do not wear jewelry, make-up or nail polish.  Do not wear lotions, powders, or perfumes. You may wear deodorant.  Do not shave 48 hours prior to surgery.  Do not bring valuables to the hospital.  Contacts, dentures or bridgework may not be worn into surgery.  Leave suitcase in the car. After surgery it may be brought to your room.  For patients admitted to the hospital, checkout time is 11:00 AM the day of discharge.   Patients discharged the day of surgery will not be allowed to drive home.  Name and phone number of your driver: family  Special Instructions: CHG Shower Use Special Wash: 1/2 bottle night before surgery and 1/2 bottle morning of surgery.   Please read over the following fact sheets that you were given: Pain Booklet, MRSA Information, Surgical Site Infection Prevention, Anesthesia Post-op Instructions and Care and Recovery After Surgery PATIENT INSTRUCTIONS POST-ANESTHESIA  IMMEDIATELY FOLLOWING SURGERY:  Do not drive or operate machinery for the first twenty four hours after surgery.  Do not make any important decisions for twenty four hours after surgery or while taking narcotic pain medications or sedatives.  If you develop intractable nausea and vomiting or a severe headache please notify your doctor immediately.  FOLLOW-UP:  Please make an appointment with your surgeon as instructed. You do not need to follow up with anesthesia unless specifically instructed to do so.  WOUND CARE INSTRUCTIONS (if applicable):  Keep a dry clean dressing on the anesthesia/puncture wound site if there is drainage.  Once the wound has quit  draining you may leave it open to air.  Generally you should leave the bandage intact for twenty four hours unless there is drainage.  If the epidural site drains for more than 36-48 hours please call the anesthesia department.  QUESTIONS?:  Please feel free to call your physician or the hospital operator if you have any questions, and they will be happy to assist you.     [facility] Anesthesia Department [address] [place] [phone]

## 2011-08-02 ENCOUNTER — Ambulatory Visit (HOSPITAL_COMMUNITY): Payer: Medicare Other | Admitting: Anesthesiology

## 2011-08-02 ENCOUNTER — Encounter (HOSPITAL_COMMUNITY): Payer: Self-pay | Admitting: Anesthesiology

## 2011-08-02 ENCOUNTER — Other Ambulatory Visit: Payer: Self-pay | Admitting: General Surgery

## 2011-08-02 ENCOUNTER — Encounter (HOSPITAL_COMMUNITY): Payer: Self-pay | Admitting: *Deleted

## 2011-08-02 ENCOUNTER — Encounter (HOSPITAL_COMMUNITY)
Admission: RE | Disposition: A | Discharge status: Home / Self Care | Payer: Self-pay | Source: Ambulatory Visit | Attending: General Surgery

## 2011-08-02 ENCOUNTER — Ambulatory Visit (HOSPITAL_COMMUNITY)
Admission: RE | Admit: 2011-08-02 | Discharge: 2011-08-02 | Disposition: A | Discharge status: Home / Self Care | Payer: Medicare Other | Source: Ambulatory Visit | Attending: General Surgery | Admitting: General Surgery

## 2011-08-02 DIAGNOSIS — Z01810 Encounter for preprocedural cardiovascular examination: Secondary | ICD-10-CM | POA: Insufficient documentation

## 2011-08-02 DIAGNOSIS — D1739 Benign lipomatous neoplasm of skin and subcutaneous tissue of other sites: Secondary | ICD-10-CM | POA: Insufficient documentation

## 2011-08-02 DIAGNOSIS — Z01812 Encounter for preprocedural laboratory examination: Secondary | ICD-10-CM | POA: Insufficient documentation

## 2011-08-02 HISTORY — PX: LIPOMA EXCISION: SHX5283

## 2011-08-02 SURGERY — EXCISION LIPOMA
Anesthesia: General | Site: Arm Upper | Laterality: Left | Wound class: Clean

## 2011-08-02 MED ORDER — GLYCOPYRROLATE 0.2 MG/ML IJ SOLN
INTRAMUSCULAR | Status: AC
  Filled 2011-08-02: qty 1

## 2011-08-02 MED ORDER — ROCURONIUM BROMIDE 100 MG/10ML IV SOLN
INTRAVENOUS | Status: DC | PRN
  Administered 2011-08-02: 5 mg via INTRAVENOUS
  Administered 2011-08-02: 20 mg via INTRAVENOUS

## 2011-08-02 MED ORDER — MIDAZOLAM HCL 2 MG/2ML IJ SOLN
1.0000 mg | INTRAMUSCULAR | Status: DC | PRN
  Administered 2011-08-02: 2 mg via INTRAVENOUS

## 2011-08-02 MED ORDER — FENTANYL CITRATE 0.05 MG/ML IJ SOLN
INTRAMUSCULAR | Status: AC
  Filled 2011-08-02: qty 2

## 2011-08-02 MED ORDER — LIDOCAINE HCL (PF) 1 % IJ SOLN
INTRAMUSCULAR | Status: AC
  Filled 2011-08-02: qty 30

## 2011-08-02 MED ORDER — BACITRACIN ZINC 500 UNIT/GM EX OINT
TOPICAL_OINTMENT | CUTANEOUS | Status: AC
  Filled 2011-08-02: qty 0.9

## 2011-08-02 MED ORDER — BUPIVACAINE HCL (PF) 0.5 % IJ SOLN
INTRAMUSCULAR | Status: AC
  Filled 2011-08-02: qty 30

## 2011-08-02 MED ORDER — SUCCINYLCHOLINE CHLORIDE 20 MG/ML IJ SOLN
INTRAMUSCULAR | Status: DC | PRN
  Administered 2011-08-02: 140 mg via INTRAVENOUS

## 2011-08-02 MED ORDER — DEXAMETHASONE SODIUM PHOSPHATE 4 MG/ML IJ SOLN
4.0000 mg | Freq: Once | INTRAMUSCULAR | Status: AC
  Administered 2011-08-02: 4 mg via INTRAVENOUS

## 2011-08-02 MED ORDER — SUCCINYLCHOLINE CHLORIDE 20 MG/ML IJ SOLN
INTRAMUSCULAR | Status: AC
  Filled 2011-08-02: qty 1

## 2011-08-02 MED ORDER — FENTANYL CITRATE 0.05 MG/ML IJ SOLN
INTRAMUSCULAR | Status: AC
  Administered 2011-08-02: 50 ug via INTRAVENOUS
  Filled 2011-08-02: qty 2

## 2011-08-02 MED ORDER — NEOSTIGMINE METHYLSULFATE 1 MG/ML IJ SOLN
INTRAMUSCULAR | Status: AC
  Filled 2011-08-02: qty 10

## 2011-08-02 MED ORDER — POVIDONE-IODINE 10 % EX OINT
TOPICAL_OINTMENT | CUTANEOUS | Status: AC
  Filled 2011-08-02: qty 2

## 2011-08-02 MED ORDER — PROPOFOL 10 MG/ML IV EMUL
INTRAVENOUS | Status: DC | PRN
  Administered 2011-08-02: 150 mg via INTRAVENOUS
  Administered 2011-08-02: 20 mg via INTRAVENOUS
  Administered 2011-08-02: 30 mg via INTRAVENOUS

## 2011-08-02 MED ORDER — GLYCOPYRROLATE 0.2 MG/ML IJ SOLN
INTRAMUSCULAR | Status: DC | PRN
  Administered 2011-08-02: .4 mg via INTRAVENOUS

## 2011-08-02 MED ORDER — FENTANYL CITRATE 0.05 MG/ML IJ SOLN
25.0000 ug | INTRAMUSCULAR | Status: DC | PRN
Start: 2011-08-02 — End: 2011-08-02
  Administered 2011-08-02: 50 ug via INTRAVENOUS

## 2011-08-02 MED ORDER — DEXAMETHASONE SODIUM PHOSPHATE 4 MG/ML IJ SOLN
INTRAMUSCULAR | Status: AC
  Administered 2011-08-02: 4 mg via INTRAVENOUS
  Filled 2011-08-02: qty 1

## 2011-08-02 MED ORDER — LIDOCAINE HCL (PF) 1 % IJ SOLN
INTRAMUSCULAR | Status: AC
  Filled 2011-08-02: qty 5

## 2011-08-02 MED ORDER — BACITRACIN-NEOMYCIN-POLYMYXIN 400-5-5000 EX OINT
TOPICAL_OINTMENT | CUTANEOUS | Status: DC | PRN
  Administered 2011-08-02: 1 via TOPICAL

## 2011-08-02 MED ORDER — FENTANYL CITRATE 0.05 MG/ML IJ SOLN
INTRAMUSCULAR | Status: DC | PRN
  Administered 2011-08-02 (×4): 50 ug via INTRAVENOUS

## 2011-08-02 MED ORDER — ROCURONIUM BROMIDE 50 MG/5ML IV SOLN
INTRAVENOUS | Status: AC
  Filled 2011-08-02: qty 1

## 2011-08-02 MED ORDER — LIDOCAINE HCL 1 % IJ SOLN
INTRAMUSCULAR | Status: DC | PRN
  Administered 2011-08-02: 10 mg via INTRADERMAL

## 2011-08-02 MED ORDER — ONDANSETRON HCL 4 MG/2ML IJ SOLN
4.0000 mg | Freq: Once | INTRAMUSCULAR | Status: AC | PRN
  Administered 2011-08-02: 4 mg via INTRAVENOUS

## 2011-08-02 MED ORDER — ONDANSETRON HCL 4 MG/2ML IJ SOLN
4.0000 mg | Freq: Once | INTRAMUSCULAR | Status: AC
  Administered 2011-08-02: 4 mg via INTRAVENOUS

## 2011-08-02 MED ORDER — SCOPOLAMINE 1 MG/3DAYS TD PT72
1.0000 | MEDICATED_PATCH | Freq: Once | TRANSDERMAL | Status: DC
  Administered 2011-08-02: 1.5 mg via TRANSDERMAL

## 2011-08-02 MED ORDER — CEFAZOLIN SODIUM 1-5 GM-% IV SOLN
1.0000 g | INTRAVENOUS | Status: AC
  Administered 2011-08-02: 1 g via INTRAVENOUS

## 2011-08-02 MED ORDER — SCOPOLAMINE 1 MG/3DAYS TD PT72
MEDICATED_PATCH | TRANSDERMAL | Status: AC
  Administered 2011-08-02: 1.5 mg via TRANSDERMAL
  Filled 2011-08-02: qty 1

## 2011-08-02 MED ORDER — CEFAZOLIN SODIUM 1-5 GM-% IV SOLN
INTRAVENOUS | Status: AC
  Filled 2011-08-02: qty 50

## 2011-08-02 MED ORDER — NEOSTIGMINE METHYLSULFATE 1 MG/ML IJ SOLN
INTRAMUSCULAR | Status: DC | PRN
  Administered 2011-08-02: 2 mg via INTRAMUSCULAR

## 2011-08-02 MED ORDER — MIDAZOLAM HCL 2 MG/2ML IJ SOLN
INTRAMUSCULAR | Status: AC
  Administered 2011-08-02: 2 mg via INTRAVENOUS
  Filled 2011-08-02: qty 2

## 2011-08-02 MED ORDER — SODIUM CHLORIDE 0.9 % IR SOLN
Status: DC | PRN
  Administered 2011-08-02: 1000 mL

## 2011-08-02 MED ORDER — LACTATED RINGERS IV SOLN
INTRAVENOUS | Status: DC
  Administered 2011-08-02: 1000 mL via INTRAVENOUS
  Administered 2011-08-02: 08:00:00 via INTRAVENOUS

## 2011-08-02 MED ORDER — ONDANSETRON HCL 4 MG/2ML IJ SOLN
INTRAMUSCULAR | Status: AC
  Administered 2011-08-02: 4 mg via INTRAVENOUS
  Filled 2011-08-02: qty 2

## 2011-08-02 MED ORDER — ONDANSETRON HCL 4 MG/2ML IJ SOLN
INTRAMUSCULAR | Status: AC
  Filled 2011-08-02: qty 2

## 2011-08-02 MED ORDER — PROPOFOL 10 MG/ML IV EMUL
INTRAVENOUS | Status: AC
  Filled 2011-08-02: qty 20

## 2011-08-02 SURGICAL SUPPLY — 64 items
APPLICATOR COTTON TIP 6IN STRL (MISCELLANEOUS) IMPLANT
APPLIER CLIP 11 MED OPEN (CLIP)
APPLIER CLIP 9.375 SM OPEN (CLIP) ×2
APR CLP SM 9.3 20 MLT OPN (CLIP) ×1
BAG HAMPER (MISCELLANEOUS) ×2 IMPLANT
BANDAGE GAUZE ELAST BULKY 4 IN (GAUZE/BANDAGES/DRESSINGS) ×2 IMPLANT
BLADE SURG 15 STRL LF DISP TIS (BLADE) ×1 IMPLANT
BLADE SURG 15 STRL SS (BLADE) ×2
CLEANER TIP ELECTROSURG 2X2 (MISCELLANEOUS) ×2 IMPLANT
CLIP APPLIE 11 MED OPEN (CLIP) IMPLANT
CLIP APPLIE 9.375 SM OPEN (CLIP) ×1 IMPLANT
CLOTH BEACON ORANGE TIMEOUT ST (SAFETY) ×2 IMPLANT
COTTON BALL STERILE (GAUZE/BANDAGES/DRESSINGS)
COTTON BALL STERILE 2 PK (GAUZE/BANDAGES/DRESSINGS) IMPLANT
COVER LIGHT HANDLE STERIS (MISCELLANEOUS) ×4 IMPLANT
ELECT NEEDLE TIP 2.8 STRL (NEEDLE) ×2 IMPLANT
ELECT REM PT RETURN 9FT ADLT (ELECTROSURGICAL) ×2
ELECTRODE REM PT RTRN 9FT ADLT (ELECTROSURGICAL) ×1 IMPLANT
EVACUATOR DRAINAGE 10X20 100CC (DRAIN) IMPLANT
EVACUATOR SILICONE 100CC (DRAIN)
FORMALIN 10 PREFIL 120ML (MISCELLANEOUS) ×2 IMPLANT
GAUZE PETROLATUM 1 X8 (GAUZE/BANDAGES/DRESSINGS) IMPLANT
GLOVE ECLIPSE 7.0 STRL STRAW (GLOVE) ×4 IMPLANT
GLOVE INDICATOR 7.0 STRL GRN (GLOVE) ×2 IMPLANT
GLOVE INDICATOR 7.5 STRL GRN (GLOVE) ×2 IMPLANT
GLOVE SKINSENSE NS SZ7.0 (GLOVE) ×1
GLOVE SKINSENSE STRL SZ7.0 (GLOVE) ×1 IMPLANT
GOWN BRE IMP SLV AUR XL STRL (GOWN DISPOSABLE) ×6 IMPLANT
INST SET MINOR GENERAL (KITS) ×2 IMPLANT
KIT ROOM TURNOVER APOR (KITS) ×2 IMPLANT
MANIFOLD NEPTUNE II (INSTRUMENTS) ×2 IMPLANT
MARKER SKIN DUAL TIP RULER LAB (MISCELLANEOUS) ×2 IMPLANT
NEEDLE HYPO 25X1 1.5 SAFETY (NEEDLE) IMPLANT
PACK BASIC LIMB (CUSTOM PROCEDURE TRAY) IMPLANT
PACK MINOR (CUSTOM PROCEDURE TRAY) ×2 IMPLANT
PAD ARMBOARD 7.5X6 YLW CONV (MISCELLANEOUS) ×2 IMPLANT
SET BASIN LINEN APH (SET/KITS/TRAYS/PACK) ×2 IMPLANT
SOL PREP PROV IODINE SCRUB 4OZ (MISCELLANEOUS) ×2 IMPLANT
SPONGE GAUZE 4X4 12PLY (GAUZE/BANDAGES/DRESSINGS) IMPLANT
SPONGE INTESTINAL PEANUT (DISPOSABLE) ×2 IMPLANT
STAPLER VISISTAT 35W (STAPLE) IMPLANT
STRIP CLOSURE SKIN 1/2X4 (GAUZE/BANDAGES/DRESSINGS) ×2 IMPLANT
STRIP CLOSURE SKIN 1/4X3 (GAUZE/BANDAGES/DRESSINGS) IMPLANT
SUT ETHILON 3 0 FSL (SUTURE) IMPLANT
SUT ETHILON 4 0 PS 2 18 (SUTURE) IMPLANT
SUT ETHILON 5 0 P 3 18 (SUTURE)
SUT NYLON ETHILON 5-0 P-3 1X18 (SUTURE) IMPLANT
SUT SILK 2 0 (SUTURE)
SUT SILK 2-0 18XBRD TIE 12 (SUTURE) IMPLANT
SUT SILK 3 0 (SUTURE)
SUT SILK 3-0 18XBRD TIE 12 (SUTURE) IMPLANT
SUT VIC AB 3-0 SH 27 (SUTURE) ×2
SUT VIC AB 3-0 SH 27X BRD (SUTURE) ×1 IMPLANT
SUT VIC AB 4-0 PS2 27 (SUTURE) IMPLANT
SUT VIC AB 4-0 SH 27 (SUTURE)
SUT VIC AB 4-0 SH 27XBRD (SUTURE) IMPLANT
SUT VIC AB 5-0 P-3 18X BRD (SUTURE) IMPLANT
SUT VIC AB 5-0 P3 18 (SUTURE)
SUT VICRYL AB 3 0 TIES (SUTURE) ×2 IMPLANT
SYR BULB IRRIGATION 50ML (SYRINGE) ×2 IMPLANT
SYR CONTROL 10ML LL (SYRINGE) ×2 IMPLANT
TAPE CLOTH SURG 4X10 WHT LF (GAUZE/BANDAGES/DRESSINGS) ×2 IMPLANT
TOWEL OR 17X26 4PK STRL BLUE (TOWEL DISPOSABLE) IMPLANT
WATER STERILE IRR 1000ML POUR (IV SOLUTION) ×4 IMPLANT

## 2011-08-02 NOTE — Progress Notes (Signed)
Pt refuses any more anti-nausea med due to "it makes me so dizzy". States "I just want to go home and let all this medicine wear off"

## 2011-08-02 NOTE — Anesthesia Preprocedure Evaluation (Addendum)
Anesthesia Evaluation  Name, MR# and DOBGeneral Assessment Comment  History of Anesthesia Complications (+) PONV  Airway Mallampati: I TM Distance: >3 FB Neck ROM: Full    Dental  (+) Teeth Intact and Implants   Pulmonary    pulmonary exam normalPulmonary Exam Normal     Cardiovascular Regular Normal    Neuro/Psych    (+) Anxiety,    GI/Hepatic/Renal (+) PUD,  GERD Medicated and Poorly Controlled     Endo/Other  (+) Hypothyroidism,      Abdominal   Musculoskeletal   Hematology   Peds  Reproductive/Obstetrics    Anesthesia Other Findings             Anesthesia Physical Anesthesia Plan  ASA: II  Anesthesia Plan: General   Post-op Pain Management:    Induction:   Airway Management Planned: Oral ETT  Additional Equipment:   Intra-op Plan:   Post-operative Plan: Extubation in OR  Informed Consent: I have reviewed the patients History and Physical, chart, labs and discussed the procedure including the risks, benefits and alternatives for the proposed anesthesia with the patient or authorized representative who has indicated his/her understanding and acceptance.     Plan Discussed with:   Anesthesia Plan Comments:         Anesthesia Quick Evaluation

## 2011-08-02 NOTE — Progress Notes (Signed)
Dressed self then ambulated to BR to urinate. Ambulated to car--refused to ride in [facility].  Vomited thick mucus when got to car. States "I'll be all right now".

## 2011-08-02 NOTE — Brief Op Note (Signed)
08/02/2011  8:59 AM  PATIENT:  Andrea Martinez  65 y.o. female  PRE-OPERATIVE DIAGNOSIS:  unspecified mass left upper arm  POST-OPERATIVE DIAGNOSIS:  giant lipoma left upper extremity  PROCEDURE:  Procedure(s): EXCISION LIPOMA  SURGEON:  Surgeon(s): Marlane Hatcher  PHYSICIAN ASSISTANT:   ASSISTANTS: none   ANESTHESIA:   general  ESTIMATED BLOOD LOSS: * No blood loss amount entered *   BLOOD ADMINISTERED:none NONE DRAINS: none   LOCAL MEDICATIONS USED:    SPECIMEN:  Source of Specimen:  mass left upper extremity  DISPOSITION OF SPECIMEN:  PATHOLOGY  COUNTS:  YES  TOURNIQUET:  * No tourniquets in log *  DICTATION #: [id number]  PLAN OF CARE:  Discharge home.  PATIENT DISPOSITION:  PACU - hemodynamically stable.   Delay start of Pharmacological VTE agent (>24hrs) due to surgical blood loss or risk of bleeding:  not applicable

## 2011-08-02 NOTE — H&P (Signed)
  No clinical change; for excision of giant lipoma of left arm.  Procedure and risks explained and informed consent obtained.

## 2011-08-02 NOTE — Anesthesia Postprocedure Evaluation (Signed)
  Anesthesia Post-op Note  Patient: Andrea Martinez  Procedure(s) Performed:  EXCISION LIPOMA - Excision Giant Lipoma Left Upper Arm  Patient Location: PACU  Anesthesia Type: General  Level of Consciousness: alert   Airway and Oxygen Therapy: Patient Spontanous Breathing  Post-op Pain: mild  Post-op Assessment: Patient's Cardiovascular Status Stable, Respiratory Function Stable, Patent Airway and No signs of Nausea or vomiting  Post-op Vital Signs: stable  Complications: No apparent anesthesia complications

## 2011-08-02 NOTE — Op Note (Signed)
NAME:  Andrea Martinez, VALIN NO.:  [id number]  MEDICAL RECORD NO.:  [id number]  LOCATION:  [facility]                          FACILITY:  [facility]  PHYSICIAN:  Barbaraann Barthel, M.D. 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  Note this is a 65 year old white female who had the presence of a mass in her left upper extremity that was increasing in size.  She also had some shoulder discomfort from that and I assured her that it was unlikely in my opinion after looking at the MRI that her pain was related very much to the mass, however, the mass was increasing in size and I thought her pain was separately related to arthritic discomfort in her left shoulder.  At any rate we discussed the need for surgery discussing complications not limited to but including bleeding and infection, the possibility of further surgery might be required.  Informed consent was obtained.  GROSS OPERATIVE FINDINGS:  The patient had a large lipomatous mass at least 10 cm in diameter, 10 x about 5-8 cm in diameter over the left biceps muscle.  SPECIMEN:  Mass, left upper extremity.  WOUND CLASSIFICATION:  Clean.  TECHNIQUE:  The patient was placed in supine position after the adequate administration of general anesthesia.  Her left upper extremity was prepped with Betadine solution and draped in usual manner.  A limb isolator was used.  The area of interest was then marked with a sterile marking pen.  We then made a longitudinal incision over this mass and then carefully dissected a lipomatous mass from her biceps area.  This was done with minimal oozing and after irrigating with normal saline solution,  I then closed the skin  with a running subcuticular 4-0 Vicryl suture with half inch Steri-Strips and a sterile dressing wrapping the wound so that we did not have to apply tape directly to her skin.  Prior to closure, all sponge, needle, and instrument counts were found to be correct.  Estimated blood loss was minimal.  No drain was placed and there were no complications.  The patient was taken to the recovery room in satisfactory condition.     Barbaraann Barthel, M.D.     WB/MEDQ  D:  08/02/2011  T:  08/02/2011  Job:  [id number]  cc:   Vickki Hearing, M.D. Fax: [phone]  Oneal Deputy. Juanetta Gosling, M.D. Fax: [phone]

## 2011-08-02 NOTE — Progress Notes (Signed)
Still refusing more anti nausea med due to how it makes her feel. States she just wants to go home.

## 2011-08-02 NOTE — H&P (Signed)
NAME:  Andrea Martinez, Andrea Martinez NO.:  [id number]  MEDICAL RECORD NO.:  [id number]  LOCATION:                                 FACILITY:  PHYSICIAN:  Barbaraann Barthel, M.D. DATE OF BIRTH:  1946-01-05  DATE OF ADMISSION: DATE OF DISCHARGE:  LH                             HISTORY & PHYSICAL   DIAGNOSIS:  Large lipoma of left upper arm (neoplasm unspecified).  NOTE:  This is a 65 year old white female who was referred to me from Dr. Romeo Apple who was previously referred by Dr. Juanetta Gosling regarding a mass that increased in size over the last year in this lady's upper arm. Clinically, this appeared to be a soft mass consistent with a giant lipoma.  This was confirmed by MRI studies which showed a 10 x 8 x 4 cm benign-appearing subcutaneous lipoma in the anterior aspect of her left arm.  She was referred for me for excision.  Ordinarily, I am able to excise this in my office; however, the patient was a little apprehensive and did not have enough local support, so we planned to do this as an outpatient.  I discussed complications with her, not limited to but including bleeding and infection and the possibility of some recurrence.  Informed consent was obtained.  PHYSICAL EXAMINATION:  GENERAL:  This is a pleasant 65 year old white female who is 5 feet, 6-1/2 inches. VITAL SIGNS:  She weighs 167 pounds.  Her temperature is 96.7, heart rate is 88 per minute and regular, respirations are 12 per minute and her blood pressure is 130/70. HEENT:  Head is normocephalic.  EYES:  Extraocular movements are intact. Pupils are round and react to light and accommodation.  There is no conjunctive pallor or scleral injection.  The sclerae are a normal tincture.  Nose and oral mucosa are moist. NECK:  Supple and cylindrical without jugular vein distention, thyromegaly, tracheal deviation, or cervical adenopathy or the presence of any bruits. CHEST:  Clear both anterior and posterior  auscultation.  Breasts and axilla are without masses. HEART:  Regular rhythm.  No murmurs are appreciated. ABDOMEN:  Soft. PELVIC AND RECTAL:  Deferred. EXTREMITIES:  The patient has a large lipomatous mass in her left upper arm.  She also has smaller lipomas in both ankles. SKIN:  Integuments otherwise are within normal limits. NEUROLOGIC:  Within normal limits.  REVIEW OF SYSTEMS:  NEURO:  Exam, no migraines or seizures.  ENDOCRINE HISTORY:  No diabetes or thyroid disease.  CARDIOPULMONARY SYSTEM: Within normal limits.  No history of tobacco abuse or alcohol abuse. MUSCULOSKELETAL SYSTEM:  Grossly within normal limits for age with some usual arthritic complaints.  OB/GYN HISTORY:  She is a gravida 2, para 2, abortus 0, cesarean 0 female with no family history of carcinoma of the breast.  She underwent a vaginal hysterectomy years ago.  GI SYSTEM: Past history of GERD.  No past history of hepatitis.  No complaints of constipation, diarrhea, bright red rectal bleeding, black tarry stools, history of inflammatory bowel disease or irritable bowel syndrome, and no history of unexplained weight loss.  Last colonoscopy was in 2006. GU SYSTEM:  No history of dysuria or frequency or  history of kidney stones.  The patient has had history of urinary tract infections.  Her last one being in 2011.  LABORATORY DATA:  Laboratory data has been performed.  I do not have that in front of me at the time of dictation but will be sent to the outpatient department.  PRESENT MEDICATIONS:  She takes Tylenol every 6 hours as needed for pain.  She takes calcium and vitamin D 600 mg two times a day and vitamin D 5000 units daily and she uses Nizoral shampoo.  ALLERGIES:  She has allergies to SULFA, CODEINE, and NEXIUM.  MRI tests as reviewed above.  IMPRESSION:  Therefore, Andrea Martinez is a 65 year old white female with a neoplasm unspecified of her left upper arm most likely a giant lipoma. We will plan  for excision of this via the outpatient department and we have discussed this in detail with her and informed consent was obtained.  Surgery planned on August 02, 2011.     Barbaraann Barthel, M.D.     WB/MEDQ  D:  08/01/2011  T:  08/01/2011  Job:  [id number]  cc:   Outpatient Surgery Department

## 2011-08-02 NOTE — Anesthesia Procedure Notes (Signed)
Procedure Name: Intubation Date/Time: 08/02/2011 8:05 AM Performed by: Minerva Areola Pre-anesthesia Checklist: Patient identified, Patient being monitored, Timeout performed, Emergency Drugs available and Suction available Patient Re-evaluated:Patient Re-evaluated prior to inductionOxygen Delivery Method: Circle System Utilized Preoxygenation: Pre-oxygenation with 100% oxygen Intubation Type: Rapid sequence, Circoid Pressure applied and IV induction Laryngoscope Size: Miller and 2 Grade View: Grade I Tube type: Oral Tube size: 7.0 mm Number of attempts: 1 Airway Equipment and Method: stylet Placement Confirmation: ETT inserted through vocal cords under direct vision,  positive ETCO2 and breath sounds checked- equal and bilateral Secured at: 21 cm Tube secured with: Tape Dental Injury: Teeth and Oropharynx as per pre-operative assessment

## 2011-08-02 NOTE — Transfer of Care (Signed)
Immediate Anesthesia Transfer of Care Note  Patient: Andrea Martinez  Procedure(s) Performed:  EXCISION LIPOMA - Excision Giant Lipoma Left Upper Arm  Patient Location: PACU  Anesthesia Type: General  Level of Consciousness: awake  Airway & Oxygen Therapy: Patient Spontanous Breathing and non-rebreather face mask  Post-op Assessment: Report given to PACU RN, Post -op Vital signs reviewed and stable and Patient moving all extremities  Post vital signs: Reviewed and stable  Complications: No apparent anesthesia complications

## 2011-08-02 NOTE — Progress Notes (Signed)
C/o nausea on arrival to post op.  Cool wet washcloth placed to forehead and neck. Iv rate increased. Blankets removed from pt so she can cool off. Recliner lowered. Nibbling on saltine crackers, sipping sprite.

## 2011-08-03 NOTE — Progress Notes (Signed)
Wrong phone #

## 2011-08-06 ENCOUNTER — Encounter (HOSPITAL_COMMUNITY): Payer: Self-pay | Admitting: General Surgery

## 2011-08-06 ENCOUNTER — Other Ambulatory Visit: Payer: Self-pay | Admitting: Gastroenterology

## 2011-09-17 ENCOUNTER — Other Ambulatory Visit (HOSPITAL_COMMUNITY): Payer: Self-pay | Admitting: Gynecology

## 2011-09-17 DIAGNOSIS — Z1231 Encounter for screening mammogram for malignant neoplasm of breast: Secondary | ICD-10-CM

## 2011-09-21 ENCOUNTER — Encounter: Payer: Self-pay | Admitting: Gastroenterology

## 2011-09-21 ENCOUNTER — Ambulatory Visit (INDEPENDENT_AMBULATORY_CARE_PROVIDER_SITE_OTHER): Payer: Medicare Other | Admitting: Gastroenterology

## 2011-09-21 DIAGNOSIS — Z1211 Encounter for screening for malignant neoplasm of colon: Secondary | ICD-10-CM | POA: Insufficient documentation

## 2011-09-21 DIAGNOSIS — K279 Peptic ulcer, site unspecified, unspecified as acute or chronic, without hemorrhage or perforation: Secondary | ICD-10-CM

## 2011-09-21 MED ORDER — DEXLANSOPRAZOLE 60 MG PO CPDR: 60.0000 mg | DELAYED_RELEASE_CAPSULE | Freq: Every day | ORAL | Status: DC

## 2011-09-21 NOTE — Assessment & Plan Note (Signed)
TCS IN 2015.

## 2011-09-21 NOTE — Progress Notes (Signed)
Cc to PCP 

## 2011-09-21 NOTE — Progress Notes (Signed)
Reminder in epic to follow up in 6 months if needed

## 2011-09-21 NOTE — Progress Notes (Signed)
  Subjective:    Patient ID: Andrea Martinez, female    DOB: 1945-12-17, 65 y.o.   MRN: [id number]  PCP: Juanetta Gosling  HPI Rare TUMS and Dexilant. Doing well. Last Dexilant AUG 2012. No nausea, vomiting, or abd pain. SURGERY ON ARM aug 20. C/o soreness in left arm. Taking acetaminophen? 2 at bedtime-doesn't help.  Past Medical History  Diagnosis Date  . Gastric ulcer 2011    EGD, 5/11  . Hypothyroidism     not on meds, followed by Dr. [name]  . PONV (postoperative nausea and vomiting)   . Lipoma     left upper arm    Past Surgical History  Procedure Date  . Esophagogastroduodenoscopy 02/2010    gastric ulcers  . Partial hysterectomy   . Nose surgery   . Lipoma excision 08/02/2011    Procedure: EXCISION LIPOMA;  Surgeon: Marlane Hatcher;  Location: [facility];  Service: General;  Laterality: Left;  Excision Giant Lipoma Left Upper Arm  . Colonoscopy 01/2004    DR [facility], few small tics    Allergies  Allergen Reactions  . Codeine Nausea Only    Headache  . Esomeprazole Magnesium Nausea Only  . Sulfonamide Derivatives Nausea Only    Headache    Current Outpatient Prescriptions  Medication Sig Dispense Refill  . acetaminophen (TYLENOL) 500 MG tablet Take 1,000 mg by mouth every 6 (six) hours as needed. Pain      . calcium carbonate (TUMS - DOSED IN MG ELEMENTAL CALCIUM) 500 MG chewable tablet Chew 1 tablet by mouth daily as needed. Acid Reflux       . Calcium-Vitamin D (CALTRATE 600 PLUS-VIT D PO) Take 2 capsules by mouth daily.        . Cholecalciferol (VITAMIN D-3) 5000 UNITS TABS Take 5,000 Units by mouth daily.        Marland Kitchen estradiol (ESTRACE) 0.5 MG tablet Take 0.5 mg by mouth daily.        . Cholecalciferol (VITAMIN D) 2000 UNITS CAPS Take 1 capsule by mouth daily. 5000 unit       . dexlansoprazole (DEXILANT) 60 MG capsule Take 1 capsule (60 mg total) by mouth daily.  31 capsule  11  . ketoconazole (NIZORAL) 2 % shampoo Apply 1 application topically once a week.       .  pentosan polysulfate (ELMIRON) 100 MG capsule Take 100 mg by mouth 3 (three) times daily before meals.        Marland Kitchen trimethoprim (TRIMPEX) 100 MG tablet Take 100 mg by mouth 2 (two) times daily.             Review of Systems     Objective:   Physical Exam  Constitutional: She appears well-developed and well-nourished.  HENT:  Head: Normocephalic and atraumatic.  Mouth/Throat: Oropharynx is clear and moist.  Eyes: Pupils are equal, round, and reactive to light. No scleral icterus.  Cardiovascular: Normal rate, regular rhythm and normal heart sounds.   Pulmonary/Chest: Effort normal and breath sounds normal.  Abdominal: Soft. Bowel sounds are normal. She exhibits no distension. There is no tenderness.  Musculoskeletal:       INCISION/DEFECT LUE WELL HEALED          Assessment & Plan:

## 2011-09-21 NOTE — Assessment & Plan Note (Addendum)
sX RESOLVED  DEXILANT PRN #10 SAMPLES GIVEN. REFILL X1 YEAR OPV IN 6 MOS. If feeling well may [facility] for OCT 2013. Pt should be seen 1-2x/year. PT MAY USE TO OTC IBUPROFEN QHS WITH FOOD OR MILK IF SHE TAKES DEXILANT.

## 2011-09-23 LAB — URINALYSIS, ROUTINE W REFLEX MICROSCOPIC
Bilirubin Urine: NEGATIVE
Glucose, UA: NEGATIVE
Hgb urine dipstick: NEGATIVE
Ketones, ur: NEGATIVE
Protein, ur: NEGATIVE

## 2011-09-23 LAB — DIFFERENTIAL
Basophils Relative: 0
Eosinophils Absolute: 0
Monocytes Relative: 3
Neutrophils Relative %: 91 — ABNORMAL HIGH

## 2011-09-23 LAB — BASIC METABOLIC PANEL
BUN: 12
CO2: 26
Chloride: 103
Creatinine, Ser: 0.69

## 2011-09-23 LAB — CBC
MCHC: 33.8
MCV: 85.1
Platelets: 285
RBC: 4.31

## 2011-10-19 ENCOUNTER — Other Ambulatory Visit: Payer: Self-pay | Admitting: Gynecology

## 2011-10-21 ENCOUNTER — Ambulatory Visit (HOSPITAL_COMMUNITY)
Admission: RE | Admit: 2011-10-21 | Discharge: 2011-10-21 | Disposition: A | Discharge status: Home / Self Care | Payer: Medicare Other | Source: Ambulatory Visit | Attending: Gynecology | Admitting: Gynecology

## 2011-10-21 DIAGNOSIS — Z1231 Encounter for screening mammogram for malignant neoplasm of breast: Secondary | ICD-10-CM

## 2011-10-28 ENCOUNTER — Telehealth: Payer: Self-pay | Admitting: Gastroenterology

## 2011-10-28 NOTE — Telephone Encounter (Signed)
Two boxes of Dexilant left at the front desk. She will have her husband pick up today.

## 2011-10-28 NOTE — Telephone Encounter (Signed)
Pt called this morning to see if she could get a few boxes of Dexilant samples. Please call her at [phone]

## 2011-11-01 ENCOUNTER — Encounter: Payer: Self-pay | Admitting: Urgent Care

## 2011-11-01 ENCOUNTER — Ambulatory Visit (INDEPENDENT_AMBULATORY_CARE_PROVIDER_SITE_OTHER): Payer: Medicare Other | Admitting: Urgent Care

## 2011-11-01 ENCOUNTER — Ambulatory Visit (HOSPITAL_COMMUNITY)
Admission: RE | Admit: 2011-11-01 | Discharge: 2011-11-01 | Disposition: A | Discharge status: Home / Self Care | Payer: Medicare Other | Source: Ambulatory Visit | Attending: Urgent Care | Admitting: Urgent Care

## 2011-11-01 ENCOUNTER — Telehealth: Payer: Self-pay | Admitting: Internal Medicine

## 2011-11-01 VITALS — BP 127/65 | HR 78 | Temp 97.5°F | Ht 66.0 in | Wt 187.2 lb

## 2011-11-01 DIAGNOSIS — R1011 Right upper quadrant pain: Secondary | ICD-10-CM

## 2011-11-01 DIAGNOSIS — K279 Peptic ulcer, site unspecified, unspecified as acute or chronic, without hemorrhage or perforation: Secondary | ICD-10-CM

## 2011-11-01 DIAGNOSIS — R079 Chest pain, unspecified: Secondary | ICD-10-CM | POA: Insufficient documentation

## 2011-11-01 DIAGNOSIS — R0781 Pleurodynia: Secondary | ICD-10-CM

## 2011-11-01 NOTE — Assessment & Plan Note (Signed)
Suspect costochondritis may be the culprit of her right rib pain. Given her history of PUD, would limit use of NSAIDs.   Right rib films Record time when your pain begins, how long it lasts, what you are doing, whether it is before or after a meal, & rate it on a scale of 1-10. Warm compresses to your right ribs for 15 minutes 4 times per day. Use Tylenol arthritis for pain as directed. Call with a progress report in 10 days.

## 2011-11-01 NOTE — Progress Notes (Signed)
Cc to PCP 

## 2011-11-01 NOTE — Patient Instructions (Addendum)
Stop Dexilant Begin AcipHex 20 mg daily (take every day) Record time when your pain begins, how long it lasts, what you are doing, whether it is before or after a meal, & rate it on a scale of 1-10. Warm compresses to your right ribs for 15 minutes 4 times per day. Use Tylenol arthritis for pain as directed.  I suspect you may have Costochondritis Costochondritis (Tietze syndrome), or costochondral separation, is a swelling and irritation (inflammation) of the tissue (cartilage) that connects your ribs with your breastbone (sternum). It may occur on its own (spontaneously), through damage caused by an accident (trauma), or simply from coughing or minor exercise. It may take up to 6 weeks to get better and longer if you are unable to be conservative in your activities. HOME CARE INSTRUCTIONS   Avoid exhausting physical activity. Try not to strain your ribs during normal activity. This would include any activities using chest, belly (abdominal), and side muscles, especially if heavy weights are used.   Use ice for 15 to 20 minutes per hour while awake for the first 2 days. Place the ice in a plastic bag, and place a towel between the bag of ice and your skin.   Only take over-the-counter or prescription medicines for pain, discomfort, or fever as directed by your caregiver.  SEEK IMMEDIATE MEDICAL CARE IF:   Your pain increases or you are very uncomfortable.   You have a fever.   You develop difficulty with your breathing.   You cough up blood.   You develop worse chest pains, shortness of breath, sweating, or vomiting.   You develop new, unexplained problems (symptoms).  MAKE SURE YOU:   Understand these instructions.   Will watch your condition.   Will get help right away if you are not doing well or get worse.  Document Released: 09/08/2005 Document Revised: 08/11/2011 Document Reviewed: 07/17/2008 [facility] Patient Information 2012 [place], [place].

## 2011-11-01 NOTE — Assessment & Plan Note (Signed)
Mostly right costal margin/right rib pain. Abdominal exam is essentially benign. see rib pain.

## 2011-11-01 NOTE — Telephone Encounter (Signed)
Pt called this morning to tell me that her right side hurts and having burning after she goes to the bathroom. She has been taking Dexilant and would like to speak with the nurse. Please return her call at [phone]

## 2011-11-01 NOTE — Assessment & Plan Note (Addendum)
History of peptic ulcer disease. She has not been needing daily PPI. We started Dexilant for 10 days without any relief.  Recently she has had right rib pain and some right upper quadrant pain. She is convinced that she has gallbladder issues since her sister and mother both had gallbladder problems, however her ultrasound and HIDA scan from last year was normal.  If she fails treatment for acid reflux/gastritis and costochondritis, will discuss further with [name] prior to repeating gallbladder workup.  Will request lab work from Dr. Nicholas Lose in [place] done last week.

## 2011-11-01 NOTE — Telephone Encounter (Signed)
REVIEWED. AGREE. 

## 2011-11-01 NOTE — Telephone Encounter (Signed)
Pt scheduled OV with Lorenza Burton, NP this afternoon at 1:30 PM.

## 2011-11-01 NOTE — Progress Notes (Signed)
Referring Provider: Fredirick Maudlin, MD Primary Care Physician:  Fredirick Maudlin, MD Primary Gastroenterologist:  [name]  Chief Complaint  Patient presents with  . Abdominal Pain    on right side and it wake her up at night,bitter taste in mouth    HPI:  Andrea Martinez is a 65 y.o. female here for care of her right upper quadrant and right rib pain. She tells me she was recently treated for urinary tract infection. She began to have iItching & burning after abx treatment for UTI.  She was given Diflucan. She called 1 week ago w/ RUQ/right rib pain.  Notes Right-sided pain for a couple weeks.  Asked for prn dexilant as she has not taken on a daily basis for quite some time.  Took for several days-no better.  Went back to [facility] (has not heard results).  Bitter taste in her mouth.  Denies heartburn.  Nausea w/ bitter taste, no vomiting,  Hard candy helps.  Denies fever or chills.   Denies anorexia.  Watching what she eats.  Avoids tea & coffee.   Wt stable.  Pain worse w/ eating.  Pain every day, Pain 7/10 now, gets to 8 or 9.   No NSAIDs.  Mammogram normal last week.  Denies CV or resp symptoms.  Denies injury or fall.  nexium caused nausea. Dexilant not effective. Not resting well due to pain-not worried or stressed. 01/2010 HIDA normal, normal ABD [place]  Past Medical History  Diagnosis Date  . Gastric ulcer 2011    EGD, 5/11  . Hypothyroidism     not on meds, followed by Dr. [name]  . PONV (postoperative nausea and vomiting)   . Lipoma     left upper arm  . UTI (lower urinary tract infection)     frequent   Past Surgical History  Procedure Date  . Esophagogastroduodenoscopy 02/2010    gastric ulcers  . Partial hysterectomy   . Nose surgery   . Lipoma excision 08/02/2011    left shoulder  . Colonoscopy 01/2004    DR [facility], few small tics   Current Outpatient Prescriptions  Medication Sig Dispense Refill  . acetaminophen (TYLENOL) 500 MG tablet Take 1,000 mg by mouth  every 6 (six) hours as needed. Pain      . calcium carbonate (TUMS - DOSED IN MG ELEMENTAL CALCIUM) 500 MG chewable tablet Chew 1 tablet by mouth daily as needed. Acid Reflux       . Cholecalciferol (VITAMIN D) 2000 UNITS CAPS Take 1 capsule by mouth daily. 5000 unit       . Cholecalciferol (VITAMIN D-3) 5000 UNITS TABS Take 5,000 Units by mouth daily.        Marland Kitchen dexlansoprazole (DEXILANT) 60 MG capsule Take 1 capsule (60 mg total) by mouth daily.  31 capsule  11  . ketoconazole (NIZORAL) 2 % shampoo Apply 1 application topically once a week.       . pentosan polysulfate (ELMIRON) 100 MG capsule Take 100 mg by mouth 3 (three) times daily before meals.        Marland Kitchen trimethoprim (TRIMPEX) 100 MG tablet Take 100 mg by mouth 2 (two) times daily.        . Calcium-Vitamin D (CALTRATE 600 PLUS-VIT D PO) Take 2 capsules by mouth daily.        Marland Kitchen estradiol (ESTRACE) 0.5 MG tablet Take 0.5 mg by mouth daily.          Allergies as of 11/01/2011 -  Review Complete 11/01/2011  Allergen Reaction Noted  . Codeine Nausea Only 02/11/2010  . Esomeprazole magnesium Nausea Only 05/20/2010  . Sulfonamide derivatives Nausea Only 02/11/2010    Review of Systems: See history of present illness, otherwise negative review of systems. Physical Exam: BP 127/65  Pulse 78  Temp(Src) 97.5 F (36.4 C) (Temporal)  Ht 5\' 6"  (1.676 m)  Wt 187 lb 3.2 oz (84.913 kg)  BMI 30.21 kg/m2 General:   Alert,  Well-developed, well-nourished, anxious, pleasant and cooperative in NAD. Head:  Normocephalic and atraumatic. Eyes:  Sclera clear, no icterus.   Conjunctiva pink. Mouth:  No deformity or lesions, oropharynx pink and moist. Neck:  Supple; no masses or thyromegaly. Heart:  Regular rate and rhythm; no murmurs, clicks, rubs,  or gallops.  Right rib pain on palpation at costal margin and just above. Abdomen:  Soft, nontender and nondistended. No masses, hepatosplenomegaly or hernias noted. Normal bowel sounds, without guarding, and  without rebound.  Negative Murphy sign. Msk:  Symmetrical without gross deformities. Normal posture. Pulses:  Normal pulses noted. Extremities:  Without clubbing or edema. Neurologic:  Alert and  oriented x4;  grossly normal neurologically. Skin:  Intact without significant lesions or rashes. Cervical Nodes:  No significant cervical adenopathy. Psych:  Alert and cooperative. Normal mood and affect.

## 2011-11-01 NOTE — Progress Notes (Signed)
Quick Note:  Please call pt & let her know that her xray is ok. If she is unable to get relief w/ tylenol arthritis, she may take 1-2 Ibuprofen q8 hrs as long as she is on her acid pill (PPI). See PCP if rib pain is no better w/ this & moist heat QID. Thanks ______

## 2011-11-02 NOTE — Progress Notes (Signed)
Quick Note:  Called and informed pt. ______ 

## 2011-11-02 NOTE — Progress Notes (Signed)
2009: CT abd/pelvis w/o contrast JAN/FEB 2011: HIDA-NL, ABD U/S: NL  Limited visualization of the pancreas. SEEN MAR 2011 FOR RIGHT SIDE PAIN-216 LBS, WUJ:[id number] INDUCED GU DEC 2011: 177 LBS.  NOW 187 LBS. PT NEEDS OPV W/ [facility] W/I THE NEXT MONTH E:30 VISIT TO DISCUSS NEED FOR ADDITIONAL GI WORKUP

## 2011-11-03 ENCOUNTER — Encounter: Payer: Self-pay | Admitting: Gastroenterology

## 2011-11-03 NOTE — Progress Notes (Signed)
Susan-please arrange OV per SLF below.  Thanks

## 2011-11-03 NOTE — Progress Notes (Signed)
Patient is aware of OV on 12/16/11 @ 2pm with SF in E30 visit

## 2011-11-11 NOTE — Progress Notes (Signed)
Labs reviewed from 10/29/11:30 BC 9, hemoglobin 13.6, platelets 286, ESR 16.

## 2011-11-12 ENCOUNTER — Telehealth: Payer: Self-pay | Admitting: Gastroenterology

## 2011-11-12 NOTE — Telephone Encounter (Signed)
Patient states Aciphex is not working as good as dexilant please advise?

## 2011-11-12 NOTE — Telephone Encounter (Signed)
Pt needs next available OV w/ [facility]. Please arrange.

## 2011-11-12 NOTE — Telephone Encounter (Signed)
Pt called. Said the Aciphex definitely is not helping as much as the Dexilant. She is going to start back on the Dexilant that she has. She kpet a log for about 6 days and then stopped, because there was not a change in pattern of her rib pain. She continues to have the pain in her ribs and a very bitter taste. Continues to have frequent urination, although previously hadd urine checked and no uti. Pt requests that [name] review her records and advise. She is aware that [name] is at the hospital and it may take awhile before she hears back.( she is aware that Tommy Rainwater is off today).

## 2011-11-15 NOTE — Telephone Encounter (Signed)
Pt informed

## 2011-11-15 NOTE — Telephone Encounter (Signed)
Pt is aware of her OV on 1/3 at 2 with SF (this is the first available E30 visit)

## 2011-11-15 NOTE — Telephone Encounter (Signed)
Please call pt. She should see her PCP for her urinary symptoms. She should continue Dexilant if it works better for her. Reflux should not cause right rib pain. She should see her PCP.

## 2011-11-15 NOTE — Telephone Encounter (Signed)
Pt aware. Darl Pikes will schedule.

## 2011-12-16 ENCOUNTER — Ambulatory Visit: Payer: Medicare Other | Admitting: Gastroenterology

## 2012-03-14 ENCOUNTER — Encounter: Payer: Self-pay | Admitting: Gastroenterology

## 2012-10-06 ENCOUNTER — Other Ambulatory Visit (HOSPITAL_COMMUNITY): Payer: Self-pay | Admitting: Gynecology

## 2012-10-06 DIAGNOSIS — Z139 Encounter for screening, unspecified: Secondary | ICD-10-CM

## 2012-10-24 ENCOUNTER — Ambulatory Visit (HOSPITAL_COMMUNITY)
Admission: RE | Admit: 2012-10-24 | Discharge: 2012-10-24 | Disposition: A | Discharge status: Home / Self Care | Payer: Medicare Other | Source: Ambulatory Visit | Attending: Gynecology | Admitting: Gynecology

## 2012-10-24 DIAGNOSIS — Z139 Encounter for screening, unspecified: Secondary | ICD-10-CM

## 2012-10-24 DIAGNOSIS — Z1231 Encounter for screening mammogram for malignant neoplasm of breast: Secondary | ICD-10-CM | POA: Insufficient documentation

## 2012-10-26 ENCOUNTER — Other Ambulatory Visit: Payer: Self-pay | Admitting: Gynecology

## 2012-11-01 ENCOUNTER — Other Ambulatory Visit: Payer: Self-pay | Admitting: Gastroenterology

## 2012-11-01 NOTE — Telephone Encounter (Signed)
Pt due for 1 yr FU OV w/ [facility] ONLY E:30 Thanks

## 2012-11-01 NOTE — Telephone Encounter (Signed)
LMOM for patient to call me back to set up OV  °

## 2012-11-02 NOTE — Telephone Encounter (Signed)
Pt is aware of OV on 12/19 @ 1030 with SF

## 2012-11-30 ENCOUNTER — Encounter: Payer: Self-pay | Admitting: Gastroenterology

## 2012-11-30 ENCOUNTER — Ambulatory Visit (INDEPENDENT_AMBULATORY_CARE_PROVIDER_SITE_OTHER): Payer: Medicare Other | Admitting: Gastroenterology

## 2012-11-30 ENCOUNTER — Telehealth: Payer: Self-pay | Admitting: Gastroenterology

## 2012-11-30 ENCOUNTER — Other Ambulatory Visit: Payer: Self-pay | Admitting: Gastroenterology

## 2012-11-30 VITALS — BP 119/65 | HR 76 | Temp 97.8°F | Ht 66.0 in | Wt 206.4 lb

## 2012-11-30 DIAGNOSIS — R109 Unspecified abdominal pain: Secondary | ICD-10-CM

## 2012-11-30 DIAGNOSIS — R1011 Right upper quadrant pain: Secondary | ICD-10-CM

## 2012-11-30 DIAGNOSIS — R1013 Epigastric pain: Secondary | ICD-10-CM

## 2012-11-30 NOTE — Assessment & Plan Note (Signed)
ASSOCIATED WITH BAD TASTE IN HER MOUTH. DOUBT IT I DUE TO GERD. PT MAY HAVE NERD OR NON-ULCER DYSPEPSIA.  UPPER ENDOSCOPY WITH ACID MONITORING DEC 26.  FOLLOW A LOW FAT DIET. SEE INFO BELOW.  LOSE WEIGHT. START WEIGHT LOSS PROGRAM AFTER JAN 1.  CONTINUE DEXILANT.  REFER YOU TO [facility] FOR SECOND OPINION REGARDING RIGHT SIDED PAIN AND BITTER TASTE IN YOUR MOUTH.  FOLLOW UP IN 3 MOS.

## 2012-11-30 NOTE — Progress Notes (Signed)
Faxed to PCP

## 2012-11-30 NOTE — Progress Notes (Signed)
Subjective:    Patient ID: Andrea Martinez, female    DOB: 01-Feb-1946, 66 y.o.   MRN: [id number]  PCP: [facility]  HPI Pain in RUQ radiating to right flank/RLQ. SHARP/DULL ACHY PAIN NEARLY ALL THE TIME. KEEPS BITTER TASTE IN HER MOUTH. AVOIDING ONIONS AND PEPPERS. QUIT COFFEE/TEA/RARE GINGER ALE. 3-4 YEARS AGO. DEXILANT NOT WORKING. BEEN PROBLEM FOR 2 YEARS. KILLING HER LIFE. DOESN'T FEEL GOOD AND NOT SLEEPING GOOD. USING HARD CANDY TO TREAT BITTER TASTE. WAS WALKING AND PAIN WAS THE SAME.  PROBLEMS WITH RIGHT TONSILS/SWOLLEN AND IT'S HARD TO SWALLOW. SPOT ON LEFT LEG. HOT WITH BLISTERS. SAW DR. HALL. MEDICATION RXN TO DEXILANT. WANTS TO SHOW ME HER SCAR. LAST TOOK DEXILANT 1 WEEK AGO. GAINED WEIGHT. FOOD CAN MAKE HER PAIN WORSE. DID EAT & HAD PAIN ONCE OR TWICE. FOOD MAKES HER STOMACH HURT EVERY TIME SHE EATS IT. NO TROUBLE ACROSS MIDDLE OF ABD. NO ASA OR NSAIDS, NO ETOH. NO NAUSEA OR VOMITING. PAIN MAY LASTS HOURS. OFF AND ON ALL DAY. MAY TAKE TYLENOL AND LAY DOWN. DRINKING A LOT OF WATER: > 6-8 GLASSES. URINE HAS AN AWFUL SMELL. NO DYSURIA OR BLOOD IN URINE OR STOOL.   PT GAINED 19 LBS SINCE NOV 2012.  Past Medical History  Diagnosis Date  . Gastric ulcer 2011    EGD, 5/11  . Hypothyroidism     not on meds, followed by Dr. [name]  . PONV (postoperative nausea and vomiting)   . Lipoma     left upper arm  . UTI (lower urinary tract infection)     frequent   Past Surgical History  Procedure Date  . Esophagogastroduodenoscopy 02/2010    gastric ulcers  . Partial hysterectomy   . Nose surgery   . Lipoma excision 08/02/2011    left shoulder  . Colonoscopy 01/2004    DR [facility], few small tics    Allergies  Allergen Reactions  . Codeine Nausea Only    Headache  . Esomeprazole Magnesium Nausea Only  . Sulfonamide Derivatives Nausea Only    Headache    Current Outpatient Prescriptions  Medication Sig Dispense Refill  . acetaminophen (TYLENOL) 500 MG tablet Take 1,000 mg by mouth  every 6 (six) hours as needed. Pain      . calcium carbonate (TUMS - DOSED IN MG ELEMENTAL CALCIUM) 500 MG chewable tablet Chew 1 tablet by mouth daily as needed. Acid Reflux       . Calcium-Vitamin D (CALTRATE 600 PLUS-VIT D PO) Take 2 capsules by mouth daily.        . Cholecalciferol (VITAMIN D) 2000 UNITS CAPS Take 1 capsule by mouth daily. 5000 unit       . Cholecalciferol (VITAMIN D-3) 5000 UNITS TABS Take 5,000 Units by mouth daily.        Marland Kitchen DEXILANT 60 MG capsule TAKE (1) CAPSULE DAILY    . estradiol (ESTRACE) 0.1 MG/GM vaginal cream Place 2 g vaginally daily.      Marland Kitchen estradiol (ESTRACE) 0.5 MG tablet Take 0.5 mg by mouth daily.        Marland Kitchen ketoconazole (NIZORAL) 2 % shampoo Apply 1 application topically once a week.       .        . VIVELLE-DOT 0.075 MG/24HR Place 1 patch onto the skin 2 (two) times a week.       .            Review of Systems     Objective:  Physical Exam  Vitals reviewed. Constitutional: She is oriented to person, place, and time. She appears well-nourished. No distress.  HENT:  Head: Normocephalic and atraumatic.  Mouth/Throat: Oropharynx is clear and moist.  Eyes: Pupils are equal, round, and reactive to light. No scleral icterus.  Neck: Normal range of motion. Neck supple. No tracheal deviation present. No thyromegaly present.  Cardiovascular: Normal rate, regular rhythm and normal heart sounds.   Pulmonary/Chest: Effort normal and breath sounds normal. No respiratory distress. She exhibits tenderness.       MODERATE TO SEVERE TTP OVER RIGHT RIBS T9-T12  Abdominal: Soft. Bowel sounds are normal. She exhibits no distension. There is tenderness. There is no rebound and no guarding.       MILD RUQ TTP  POSITIVE CARNETT'S SIGN WITH RAISING LEGS/HEAD OFF THE EXAM TABLE.  Musculoskeletal: Normal range of motion.  Lymphadenopathy:    She has no cervical adenopathy.  Neurological: She is alert and oriented to person, place, and time.       NO FOCAL DEFICITS     Psychiatric:       SLIGHTLY ANXIOUS MOOD, NL AFFECT          Assessment & Plan:

## 2012-11-30 NOTE — Telephone Encounter (Signed)
I Have faxed referrals to [facility] and Dr. Gerilyn Pilgrim for Mrs.Kanzler and they will contact the patient for appointment date & time and Mrs. Iams is aware

## 2012-11-30 NOTE — Assessment & Plan Note (Addendum)
MOST LIKELY DUE TO RIB PAIN. DOUBT GB ETIOLOGY IN LIGHT OF NL HIDA IN 2011 AND PAIN IS NOT TYPICAL FOR BIOLIARY COLIC. DIFFERENTIAL INCLUDE LOW LIKELIHOOD OF UNCONTROLLED GERD OR PUD.  UPPER ENDOSCOPY WITH ACID MONITORING DEC 26.  FOLLOW A LOW FAT DIET. SEE INFO BELOW.  LOSE WEIGHT. START WEIGHT LOSS PROGRAM AFTER JAN 1.  CONTINUE DEXILANT.  REFER YOU TO DR. Gerilyn Pilgrim TO CONSIDER INJECTIONS IN TO YOUR RIBS TO CONTROL YOUR RIGHT SIDED PAIN.  REFER YOU TO [facility] FOR SECOND OPINION REGARDING RIGHT SIDED PAIN AND BITTER TASTE IN YOUR MOUTH.  FOLLOW UP IN 3 MOS.

## 2012-11-30 NOTE — Patient Instructions (Addendum)
UPPER ENDOSCOPY WITH ACID MONITORING DEC 26.  FOLLOW A LOW FAT DIET. SEE INFO BELOW.  LOSE WEIGHT. START WEIGHT LOSS PROGRAM AFTER JAN 1.  CONTINUE DEXILANT.  I WILL REFER YOU TO DR. Gerilyn Pilgrim TO CONSIDER INJECTIONS IN TO YOUR RIBS TO CONTROL YOUR RIGHT SIDED PAIN.  I WILL REFER YOU TO [facility] FOR SECOND OPINION REGARDING RIGHT SIDED PAIN AND BITTER TASTE IN YOUR MOUTH.  FOLLOW UP IN 3 MOS.  Low-Fat Diet BREADS, CEREALS, PASTA, RICE, DRIED PEAS, AND BEANS These products are high in carbohydrates and most are low in fat. Therefore, they can be increased in the diet as substitutes for fatty foods. They too, however, contain calories and should not be eaten in excess. Cereals can be eaten for snacks as well as for breakfast.   FRUITS AND VEGETABLES It is good to eat fruits and vegetables. Besides being sources of fiber, both are rich in vitamins and some minerals. They help you get the daily allowances of these nutrients. Fruits and vegetables can be used for snacks and desserts.  MEATS Limit lean meat, chicken, [place], and fish to no more than 6 ounces per day. Beef, Pork, and Lamb Use lean cuts of beef, pork, and lamb. Lean cuts include:  Extra-lean ground beef.  Arm roast.  Sirloin tip.  Center-cut ham.  Round steak.  Loin chops.  Rump roast.  Tenderloin.  Trim all fat off the outside of meats before cooking. It is not necessary to severely decrease the intake of red meat, but lean choices should be made. Lean meat is rich in protein and contains a highly absorbable form of iron. Premenopausal women, in particular, should avoid reducing lean red meat because this could increase the risk for low red blood cells (iron-deficiency anemia).  Chicken and [place] These are good sources of protein. The fat of poultry can be reduced by removing the skin and underlying fat layers before cooking. Chicken and [place] can be substituted for lean red meat in the diet. Poultry should not be fried  or covered with high-fat sauces. Fish and Shellfish Fish is a good source of protein. Shellfish contain cholesterol, but they usually are low in saturated fatty acids. The preparation of fish is important. Like chicken and [place], they should not be fried or covered with high-fat sauces. EGGS Egg whites contain no fat or cholesterol. They can be eaten often. Try 1 to 2 egg whites instead of whole eggs in recipes or use egg substitutes that do not contain yolk. MILK AND DAIRY PRODUCTS Use skim or 1% milk instead of 2% or whole milk. Decrease whole milk, natural, and processed cheeses. Use nonfat or low-fat (2%) cottage cheese or low-fat cheeses made from vegetable oils. Choose nonfat or low-fat (1 to 2%) yogurt. Experiment with evaporated skim milk in recipes that call for heavy cream. Substitute low-fat yogurt or low-fat cottage cheese for sour cream in dips and salad dressings. Have at least 2 servings of low-fat dairy products, such as 2 glasses of skim (or 1%) milk each day to help get your daily calcium intake. FATS AND OILS Reduce the total intake of fats, especially saturated fat. Butterfat, lard, and beef fats are high in saturated fat and cholesterol. These should be avoided as much as possible. Vegetable fats do not contain cholesterol, but certain vegetable fats, such as coconut oil, palm oil, and palm kernel oil are very high in saturated fats. These should be limited. These fats are often used in [employer], processed foods,  popcorn, oils, and nondairy creamers. Vegetable shortenings and some peanut butters contain hydrogenated oils, which are also saturated fats. Read the labels on these foods and check for saturated vegetable oils. Unsaturated vegetable oils and fats do not raise blood cholesterol. However, they should be limited because they are fats and are high in calories. Total fat should still be limited to 30% of your daily caloric intake. Desirable liquid vegetable oils are corn oil,  cottonseed oil, olive oil, canola oil, safflower oil, soybean oil, and sunflower oil. Peanut oil is not as good, but small amounts are acceptable. Buy a heart-healthy tub margarine that has no partially hydrogenated oils in the ingredients. Mayonnaise and salad dressings often are made from unsaturated fats, but they should also be limited because of their high calorie and fat content. Seeds, nuts, peanut butter, olives, and avocados are high in fat, but the fat is mainly the unsaturated type. These foods should be limited mainly to avoid excess calories and fat. OTHER EATING TIPS Snacks  Most sweets should be limited as snacks. They tend to be rich in calories and fats, and their caloric content outweighs their nutritional value. Some good choices in snacks are graham crackers, melba toast, soda crackers, bagels (no egg), English muffins, fruits, and vegetables. These snacks are preferable to snack crackers, [place] fries, TORTILLA CHIPS, and POTATO chips. Popcorn should be air-popped or cooked in small amounts of liquid vegetable oil. Desserts Eat fruit, low-fat yogurt, and fruit ices instead of pastries, cake, and cookies. Sherbet, angel food cake, gelatin dessert, frozen low-fat yogurt, or other frozen products that do not contain saturated fat (pure fruit juice bars, frozen ice pops) are also acceptable.  COOKING METHODS Choose those methods that use little or no fat. They include: Poaching.  Braising.  Steaming.  Grilling.  Baking.  Stir-frying.  Broiling.  Microwaving.  Foods can be cooked in a nonstick pan without added fat, or use a nonfat cooking spray in regular cookware. Limit fried foods and avoid frying in saturated fat. Add moisture to lean meats by using water, broth, cooking wines, and other nonfat or low-fat sauces along with the cooking methods mentioned above. Soups and stews should be chilled after cooking. The fat that forms on top after a few hours in the refrigerator should  be skimmed off. When preparing meals, avoid using excess salt. Salt can contribute to raising blood pressure in some people.  EATING AWAY FROM HOME Order entres, potatoes, and vegetables without sauces or butter. When meat exceeds the size of a deck of cards (3 to 4 ounces), the rest can be taken home for another meal. Choose vegetable or fruit salads and ask for low-calorie salad dressings to be served on the side. Use dressings sparingly. Limit high-fat toppings, such as bacon, crumbled eggs, cheese, sunflower seeds, and olives. Ask for heart-healthy tub margarine instead of butter.

## 2012-12-01 ENCOUNTER — Encounter (HOSPITAL_COMMUNITY): Payer: Self-pay | Admitting: Pharmacy Technician

## 2012-12-04 ENCOUNTER — Ambulatory Visit (HOSPITAL_COMMUNITY)
Admission: RE | Admit: 2012-12-04 | Discharge: 2012-12-04 | Disposition: A | Discharge status: Home / Self Care | Payer: Medicare Other | Source: Ambulatory Visit | Attending: Gastroenterology | Admitting: Gastroenterology

## 2012-12-04 ENCOUNTER — Encounter (HOSPITAL_COMMUNITY)
Admission: RE | Disposition: A | Discharge status: Home / Self Care | Payer: Self-pay | Source: Ambulatory Visit | Attending: Gastroenterology

## 2012-12-04 ENCOUNTER — Encounter (HOSPITAL_COMMUNITY): Payer: Self-pay

## 2012-12-04 DIAGNOSIS — K219 Gastro-esophageal reflux disease without esophagitis: Secondary | ICD-10-CM | POA: Insufficient documentation

## 2012-12-04 DIAGNOSIS — K2960 Other gastritis without bleeding: Secondary | ICD-10-CM | POA: Insufficient documentation

## 2012-12-04 DIAGNOSIS — R109 Unspecified abdominal pain: Secondary | ICD-10-CM

## 2012-12-04 DIAGNOSIS — K3189 Other diseases of stomach and duodenum: Secondary | ICD-10-CM | POA: Insufficient documentation

## 2012-12-04 HISTORY — PX: BRAVO PH STUDY: SHX5421

## 2012-12-04 HISTORY — PX: ESOPHAGOGASTRODUODENOSCOPY: SHX5428

## 2012-12-04 SURGERY — EGD (ESOPHAGOGASTRODUODENOSCOPY)
Anesthesia: Moderate Sedation

## 2012-12-04 MED ORDER — MEPERIDINE HCL 100 MG/ML IJ SOLN
INTRAMUSCULAR | Status: DC | PRN
  Administered 2012-12-04 (×3): 25 mg via INTRAVENOUS

## 2012-12-04 MED ORDER — MIDAZOLAM HCL 5 MG/5ML IJ SOLN
INTRAMUSCULAR | Status: AC
  Filled 2012-12-04: qty 10

## 2012-12-04 MED ORDER — MIDAZOLAM HCL 5 MG/5ML IJ SOLN
INTRAMUSCULAR | Status: DC | PRN
  Administered 2012-12-04 (×2): 2 mg via INTRAVENOUS

## 2012-12-04 MED ORDER — PROMETHAZINE HCL 25 MG/ML IJ SOLN: 6.2500 mg | Freq: Four times a day (QID) | INTRAMUSCULAR | Status: DC | PRN

## 2012-12-04 MED ORDER — SODIUM CHLORIDE 0.45 % IV SOLN
INTRAVENOUS | Status: DC
  Administered 2012-12-04: 14:00:00 via INTRAVENOUS

## 2012-12-04 MED ORDER — PROMETHAZINE HCL 25 MG/ML IJ SOLN
6.2500 mg | Freq: Once | INTRAMUSCULAR | Status: AC
  Administered 2012-12-04: 6.25 mg via INTRAVENOUS

## 2012-12-04 MED ORDER — STERILE WATER FOR IRRIGATION IR SOLN
Status: DC | PRN
  Administered 2012-12-04: 16:00:00

## 2012-12-04 MED ORDER — PROMETHAZINE HCL 25 MG/ML IJ SOLN
INTRAMUSCULAR | Status: AC
  Filled 2012-12-04: qty 1

## 2012-12-04 MED ORDER — SODIUM CHLORIDE 0.9 % IJ SOLN
INTRAMUSCULAR | Status: AC
  Filled 2012-12-04: qty 10

## 2012-12-04 MED ORDER — MEPERIDINE HCL 100 MG/ML IJ SOLN
INTRAMUSCULAR | Status: AC
  Filled 2012-12-04: qty 1

## 2012-12-04 NOTE — Interval H&P Note (Signed)
History and Physical Interval Note:  12/04/2012 2:08 PM  Andrea Martinez  has presented today for surgery, with the diagnosis of Abdominal Pain and Reflux  The various methods of treatment have been discussed with the patient and family. After consideration of risks, benefits and other options for treatment, the patient has consented to  Procedure(s) (LRB) with comments: ESOPHAGOGASTRODUODENOSCOPY (EGD) (N/A) - 2:15 BRAVO PH STUDY (N/A) as a surgical intervention .  The patient's history has been reviewed, patient examined, no change in status, stable for surgery.  I have reviewed the patient's chart and labs.  Questions were answered to the patient's satisfaction.     [employer]

## 2012-12-04 NOTE — Op Note (Addendum)
[facility] [address] [place], [address]   ENDOSCOPY PROCEDURE REPORT  PATIENT: Andrea Martinez, Andrea Martinez  MR#: [id number] BIRTHDATE: [birth year] , 66  yrs. old GENDER: Female  ENDOSCOPIST: Jonette Eva, MD REFERRED [id number] Juanetta Gosling, M.D. PROCEDURE DATE: 12/04/2012 PROCEDURE:   EGD w/ biopsy for H.pylori and EGD w/ Bravo capsule placement  INDICATIONS:DYSPEPSIA.  C/O UNCONTROLLED REFLUX.  GAINED 19 LBS SINCE MAY 2012.  MEDICATIONS: Demerol 75 mg IV, Versed 4 mg IV, and Promethazine (Phenergan) 12.5mg  IV TOPICAL ANESTHETIC:   Cetacaine Spray  DESCRIPTION OF PROCEDURE:     Physical exam was performed.  Informed consent was obtained from the patient after explaining the benefits, risks, and alternatives to the procedure.  The patient was connected to the monitor and placed in the left lateral position.  Continuous oxygen was provided by nasal cannula and IV medicine administered through an indwelling cannula.  After administration of sedation, the patients esophagus was intubated and the [id number] ([id number])  endoscope was advanced under direct visualization to the second portion of the duodenum.  The scope was removed slowly by carefully examining the color, texture, anatomy, and integrity of the mucosa on the way out.  The patient was recovered in endoscopy and discharged home in satisfactory condition.      ESOPHAGUS: The mucosa of the esophagus appeared normal.   STOMACH: Mild erosive gastritis (inflammation) was found in the gastric antrum.  Multiple biopsies were performed.   DUODENUM: The duodenal mucosa showed no abnormalities in the bulb and second portion of the duodenum.   GE JUNCTION 41 CM FROM THE TEETH.  BRAVO CAPSULE PLACED 35 CM FROM THE TEETH.  COMPLICATIONS:   None  ENDOSCOPIC IMPRESSION: 1.   The mucosa of the esophagus appeared normal 2.   Erosive gastritis (inflammation) was found in the gastric antrum; multiple biopsies 3.   The  duodenal mucosa showed no abnormalities in the bulb and second portion of the duodenum 4.   GE JUNCTION 41 CM FROM THE TEETH.  BRAVO CAPSULE PLACED 35 CM FROM THE TEETH.  RECOMMENDATIONS: CONTINUE DEXILANT BRAVO STUDY FOR 48 HRS LOW FAT DIET OPV MAR 2014 CONSIDER CT OF CHEST/ABD W/ IVC  REPEAT EXAM: ______________________________ Rosalie DoctorJonette Eva, MD 12/04/2012 4:34 PM Revised: 12/04/2012 4:34 PM      PATIENT NAME:  Andrea Martinez, Andrea Martinez MR#: [id number]

## 2012-12-04 NOTE — H&P (View-Only) (Signed)
Subjective:    Patient ID: Andrea Martinez, female    DOB: 01/10/1946, 66 y.o.   MRN: [id number]  PCP: HAWKINS  HPI Pain in RUQ radiating to right flank/RLQ. SHARP/DULL ACHY PAIN NEARLY ALL THE TIME. KEEPS BITTER TASTE IN HER MOUTH. AVOIDING ONIONS AND PEPPERS. QUIT COFFEE/TEA/RARE GINGER ALE. 3-4 YEARS AGO. DEXILANT NOT WORKING. BEEN PROBLEM FOR 2 YEARS. KILLING HER LIFE. DOESN'T FEEL GOOD AND NOT SLEEPING GOOD. USING HARD CANDY TO TREAT BITTER TASTE. WAS WALKING AND PAIN WAS THE SAME.  PROBLEMS WITH RIGHT TONSILS/SWOLLEN AND IT'S HARD TO SWALLOW. SPOT ON LEFT LEG. HOT WITH BLISTERS. SAW DR. HALL. MEDICATION RXN TO DEXILANT. WANTS TO SHOW ME HER SCAR. LAST TOOK DEXILANT 1 WEEK AGO. GAINED WEIGHT. FOOD CAN MAKE HER PAIN WORSE. DID EAT & HAD PAIN ONCE OR TWICE. FOOD MAKES HER STOMACH HURT EVERY TIME SHE EATS IT. NO TROUBLE ACROSS MIDDLE OF ABD. NO ASA OR NSAIDS, NO ETOH. NO NAUSEA OR VOMITING. PAIN MAY LASTS HOURS. OFF AND ON ALL DAY. MAY TAKE TYLENOL AND LAY DOWN. DRINKING A LOT OF WATER: > 6-8 GLASSES. URINE HAS AN AWFUL SMELL. NO DYSURIA OR BLOOD IN URINE OR STOOL.   PT GAINED 19 LBS SINCE NOV 2012.  Past Medical History  Diagnosis Date  . Gastric ulcer 2011    EGD, 5/11  . Hypothyroidism     not on meds, followed by Dr. Altheimer  . PONV (postoperative nausea and vomiting)   . Lipoma     left upper arm  . UTI (lower urinary tract infection)     frequent   Past Surgical History  Procedure Date  . Esophagogastroduodenoscopy 02/2010    gastric ulcers  . Partial hysterectomy   . Nose surgery   . Lipoma excision 08/02/2011    left shoulder  . Colonoscopy 01/2004    DR REHMAN, few small tics    Allergies  Allergen Reactions  . Codeine Nausea Only    Headache  . Esomeprazole Magnesium Nausea Only  . Sulfonamide Derivatives Nausea Only    Headache    Current Outpatient Prescriptions  Medication Sig Dispense Refill  . acetaminophen (TYLENOL) 500 MG tablet Take 1,000 mg by mouth  every 6 (six) hours as needed. Pain      . calcium carbonate (TUMS - DOSED IN MG ELEMENTAL CALCIUM) 500 MG chewable tablet Chew 1 tablet by mouth daily as needed. Acid Reflux       . Calcium-Vitamin D (CALTRATE 600 PLUS-VIT D PO) Take 2 capsules by mouth daily.        . Cholecalciferol (VITAMIN D) 2000 UNITS CAPS Take 1 capsule by mouth daily. 5000 unit       . Cholecalciferol (VITAMIN D-3) 5000 UNITS TABS Take 5,000 Units by mouth daily.        . DEXILANT 60 MG capsule TAKE (1) CAPSULE DAILY    . estradiol (ESTRACE) 0.1 MG/GM vaginal cream Place 2 g vaginally daily.      . estradiol (ESTRACE) 0.5 MG tablet Take 0.5 mg by mouth daily.        . ketoconazole (NIZORAL) 2 % shampoo Apply 1 application topically once a week.       .        . VIVELLE-DOT 0.075 MG/24HR Place 1 patch onto the skin 2 (two) times a week.       .            Review of Systems     Objective:     Physical Exam  Vitals reviewed. Constitutional: She is oriented to person, place, and time. She appears well-nourished. No distress.  HENT:  Head: Normocephalic and atraumatic.  Mouth/Throat: Oropharynx is clear and moist.  Eyes: Pupils are equal, round, and reactive to light. No scleral icterus.  Neck: Normal range of motion. Neck supple. No tracheal deviation present. No thyromegaly present.  Cardiovascular: Normal rate, regular rhythm and normal heart sounds.   Pulmonary/Chest: Effort normal and breath sounds normal. No respiratory distress. She exhibits tenderness.       MODERATE TO SEVERE TTP OVER RIGHT RIBS T9-T12  Abdominal: Soft. Bowel sounds are normal. She exhibits no distension. There is tenderness. There is no rebound and no guarding.       MILD RUQ TTP  POSITIVE CARNETT'S SIGN WITH RAISING LEGS/HEAD OFF THE EXAM TABLE.  Musculoskeletal: Normal range of motion.  Lymphadenopathy:    She has no cervical adenopathy.  Neurological: She is alert and oriented to person, place, and time.       NO FOCAL DEFICITS     Psychiatric:       SLIGHTLY ANXIOUS MOOD, NL AFFECT          Assessment & Plan:   

## 2012-12-05 ENCOUNTER — Telehealth: Payer: Self-pay

## 2012-12-05 NOTE — Telephone Encounter (Signed)
PLEASE CALL PT.  THE INSTRUCTIONS ARE VERY BASIC. SHE NEEDS TO KEEP THE RECORDER ON HER PERSON AT ALL TIMES FOR THE NEXT 24-36 HOURS. IF SHE TAKES A SHOWER SHE NEEDS TO HANG IT NEAR HER IN THE SHOWER. SHE NEEDS TO TAKE HER DEXILANT & WRITE DOWN IF SHE HAS REFLUX SYMPTOMS: HEARTBURN, CHEST PAIN. SHE NEEDS TO RECORD EVERYTHING THAT GOES IN HER MOUTH. THE DEVICE WILL STOP RECORDING AFTER 4 PM ON THUR. SHE SHOULD RETURN THE RECORDER ON Friday.

## 2012-12-05 NOTE — Telephone Encounter (Signed)
Pt called. Said she had EGD yesterday and she has questions about a devise that she is using to record indigestion, chest pain etc. I told her to call [facility] they could give her more info. She said she could not remember what she was told yesterday and she just has questions and needs to find out a little more what to do. She said she will call [facility].

## 2012-12-05 NOTE — Telephone Encounter (Signed)
Called and informed pt.  

## 2012-12-08 DIAGNOSIS — K219 Gastro-esophageal reflux disease without esophagitis: Secondary | ICD-10-CM

## 2012-12-11 NOTE — Progress Notes (Signed)
Pt is aware of OV on 3/6 at 0900 with SF in E30 °

## 2012-12-12 ENCOUNTER — Encounter (HOSPITAL_COMMUNITY): Payer: Self-pay | Admitting: Gastroenterology

## 2012-12-14 ENCOUNTER — Telehealth: Payer: Self-pay | Admitting: Gastroenterology

## 2012-12-14 NOTE — Telephone Encounter (Signed)
Please call pt. HER stomach Bx shows gastritis. HER BRAVO STUDY SHOWS SHE HAS NON-ACID REFLUX. HER DEXILANT IS SHUTTING DOWN HER ACID PUMPS LIKE IT IS SUPPOSE TO.   SHE NEEDS TO ADD BACLOFEN 10 MG 30 MINUTES BEFORE BREAKFAST, LUNCH AND DINNER. IT MAY CAUSE DIZZINESS OR DROWSINESS. SHE SHOULD TAKE HER FIRST DOSE BEFORE DINNER. CALL IF SHE HAS ANY PROBLEMS WITH THE MEDICATIONS.  CONTINUE DEXILANT.  FOLLOW A LOW FAT DIET.   YOU SHOULD LOSE 20 LBS.  FOLLOW UP IN MAR 2014 E30 NON-ACID REFLUX.  SHE SHOULD KEEP HER APPT AT [facility].

## 2012-12-14 NOTE — Telephone Encounter (Signed)
Path faxed to PCP, appt made  

## 2012-12-14 NOTE — Brief Op Note (Signed)
12/04/2012  2:09 PM  PATIENT:  SANJNA HASKEW  67 y.o. female  PRE-OPERATIVE DIAGNOSIS:  Abdominal Pain and Reflux. PT DID NOT RECORD A FOOD DIARY.  POST-OPERATIVE DIAGNOSIS:  NON-ACID REFLUX  PROCEDURE:  Procedure(s) (LRB) with comments: ESOPHAGOGASTRODUODENOSCOPY (EGD) (N/A) - 2:15 BRAVO PH STUDY (N/A)  SURGEON:  Surgeon(s) and Role:    * West Bali, MD - Primary  FINDINGS:  PT HAD RECORDER FOR 1 DAY AND 20 HOURS 54 MINUTES   FRACTION Ph TOTAL:  DAY 1 0.5  DAY 2 0.4   # OF REFLUXES:    DAY 1 15  DAY 2 8   LONG REFLUX > 5:   DAY 1 0 DAY 2 0    LONGEST REFLUX:   DAY 1 3 DAY 2 1   REFLUX TABLE: UPRIGHT 23 EPISODES, SUPINE 0  DEMEESTER SCORE DAY 1:  2.6 (NL < 14.72)  DEMEESTER SCORE DAY 2:  1.5 SAP TABLE: ACID REFLUX ANALYSIS: 99.1 % SUPINE  DIAGNOSIS: NON-ACID REFLUX. SIGNIFICANT SYMPTOMS WHEN SUPINE  PLAN: 1. ADD BACLOFEN 10 MG QAC. MED WARNINGS GIVEN 2. CONTINUE DEXILANT QD. LOSE 20 LBS. 3. LOW FAT DIET 4. OPV IN MAR 2014-E:30 VISIT GERD CONTROLLED/NERD UNCONTROLLED.

## 2012-12-15 NOTE — Telephone Encounter (Signed)
Called and informed pt. Rx called to [place] at [facility]. I only called in #90 with no refills since it was not specified.

## 2012-12-18 ENCOUNTER — Telehealth: Payer: Self-pay | Admitting: Gastroenterology

## 2012-12-18 NOTE — Telephone Encounter (Signed)
Pt had LMOM and I called her back to see what I could do for her. She said she had talked with DS on Friday and a prescription had been called in for her. She is worried about the side effects of the medication and does not want to take it. She continues with multiple concerns about a bitter taste in her mouth without reflux and burning when going to the bathroom. She agrees that Dexilant is working but still has questions about that as well. Please call patient back at [phone]

## 2012-12-18 NOTE — Telephone Encounter (Signed)
I called pt. She said she read side effects of the Baclofen and she decided not to take it. She said one of the side effects was staying awake and she already does not sleep well. She also said that it said to start and increase gradually and she was told to take 3 times daily. She said the Dexilant is helping. She is not having reflux but she has such a terrible bitter taste in her mouth and it feels like it goes into her esophagus. I told her she should call PCP if she feels she has a UTI. She said that she only has the burning when she has the bitter taste and it will be just once and then she is OK/ She said she knows when she has a UTI. She is aware that [name] is off for the next 2 days and that I will send this to one of the extenders.

## 2012-12-18 NOTE — Telephone Encounter (Signed)
Pt was prescribed low dose baclofen.  Please reassure her that she should at least try what Dr Darrick Penna recommended & call if she has any problems.  That bitter taste is most likely non-acid reflux which will be treated with baclofen. Thanks

## 2012-12-18 NOTE — Telephone Encounter (Signed)
Called and informed pt.  

## 2012-12-19 ENCOUNTER — Other Ambulatory Visit: Payer: Self-pay | Admitting: Urgent Care

## 2012-12-19 ENCOUNTER — Telehealth: Payer: Self-pay | Admitting: *Deleted

## 2012-12-19 MED ORDER — PANTOPRAZOLE SODIUM 40 MG PO TBEC: 40.0000 mg | DELAYED_RELEASE_TABLET | Freq: Two times a day (BID) | ORAL | Status: DC

## 2012-12-19 MED ORDER — DEXLANSOPRAZOLE 60 MG PO CPDR: 60.0000 mg | DELAYED_RELEASE_CAPSULE | Freq: Every day | ORAL | Status: DC

## 2012-12-19 NOTE — Telephone Encounter (Signed)
Andrea Martinez called today regarding her co pay for her dexilant. It will be over $100 and she cannot afford that. Please call her back. Thanks.

## 2012-12-19 NOTE — Telephone Encounter (Signed)
Called and spoke with the patient this afternoon. She said her co-pay would be $175.00 and she cannot afford that. She said when she first got it she had met a deductible and she paid $95.00 for it. I told her I will leave her some samples of Dexilant until she gets something. ( #15 of Dexilant 60 mg at front and to take once a day).   She said that the pharmacist said her insurance recommends one of the following: 1. Pantoprazole 2. Lansoprazole 3. Omeprazole  She just wants something that will help her. She said she did begin the Baclofen yesterday as instructed. She is doing very well. Had just a little nausea, a little headache. But she does feel it has started helping the bitter taste in her mouth. She will definitely give it a good try.   The only thing other than Dexilant that she has tried is the Nexium and it did not help.

## 2012-12-19 NOTE — Telephone Encounter (Signed)
Trial pantoprazole 40 mg twice a day Stop Dexilant  Call if no better

## 2012-12-20 NOTE — Telephone Encounter (Signed)
Pt was informed . She is aware not to take both PPI's at the time. She said she will probably pick up Dexilant to keep and use if she has a problem getting/ using the Pantoprazole.

## 2012-12-20 NOTE — Telephone Encounter (Signed)
LMOM to call.

## 2013-01-15 ENCOUNTER — Telehealth: Payer: Self-pay | Admitting: Gastroenterology

## 2013-01-15 NOTE — Telephone Encounter (Signed)
I called the pt. She said the Baclofen is not helping her at all, taking tid. She said that she still has the hurting in her chest some The pain is in her chest on the right side below her breast bone. She is having nausea. She said this morning, the pain was about a 7 on a scale of 1-10. She rated it now as a 6. She said the pain is usually constant it just gets worse at times. She just feels the Baclofen has not helped her at all. She said she feels there is something else going on in her body and she would just like to know what it is. She is not sleeping. She is trying to stay on her diet, but all she has had today is plain toast and apple juice. She said the bitter taste is as bad or worse. Please advise!

## 2013-01-15 NOTE — Telephone Encounter (Signed)
Pt is aware of OV on 3/6 at 0900 with SF in E30

## 2013-01-15 NOTE — Telephone Encounter (Signed)
I did fax referral for them both back in Dec

## 2013-01-15 NOTE — Telephone Encounter (Signed)
Please offer pt next available OV. Also, does she have upcoming OV w/ Dr Gerilyn Pilgrim & [facility] scheduled?

## 2013-01-15 NOTE — Telephone Encounter (Signed)
Pt called this morning asking to speak with SF,DS or AS regarding her medicine (Baclofen) not working and the pain she is in. Patient is very anxious and would like a return call ASAP [phone]

## 2013-01-16 ENCOUNTER — Other Ambulatory Visit: Payer: Self-pay | Admitting: Gastroenterology

## 2013-01-16 DIAGNOSIS — R109 Unspecified abdominal pain: Secondary | ICD-10-CM

## 2013-01-16 MED ORDER — BACLOFEN 20 MG PO TABS: ORAL_TABLET | ORAL | Status: DC

## 2013-01-16 NOTE — Telephone Encounter (Signed)
Pt called back and said she is still afraid to take the Baclofen and afraid to quit it abrupty. She just feels that maybe it is causing some of the problem. She wants to know if Dr.Fields can refer her to some doctor other than Dr. Gerilyn Pilgrim, she is just not happy with being referred to a pain clinic. I told her [name] has ordered a CT to complete evaluation of her stomach. She should try to do it, so [name] will know. I told her to give it good thought and at least call back in the morning and let us know that we can schedule it. She said she will do so.

## 2013-01-16 NOTE — Telephone Encounter (Addendum)
PLEASE CALL PT. HER RIGHT SIDED PAIN IS MOST LIKELY DUE TO RIB PAIN. I DOUBT SHE HAS A GB PROBLEM. HOWEVER, WE CAN GET A CT OF THE ABD/PELVIS WITH IV/ORAL CONTRAST ASAP TO COMPLETE THE EVALUATION FOR HER ABDOMINAL PAIN.   HER UPPER ENDOSCOPY WITH ACID MONITORING SHOWED NON-ACID REFLUX. SHE SHOULD CONTINUE THE BACLOFEN. FOLLOW A LOW FAT DIET. SHE SHOULD LOSE WEIGHT & CONTINUE PROTONIX.   SHE WAS REFERRED TO DR. Gerilyn Pilgrim TO CONSIDER INJECTIONS IN TO YOUR RIBS TO CONTROL YOUR RIGHT SIDED PAIN.  SHE SHOULD FOLLOW UP WITH [facility] FOR SECOND OPINION REGARDING RIGHT SIDED PAIN AND BITTER TASTE IN YOUR MOUTH.   FOLLOW UP IN MAR 2014.

## 2013-01-16 NOTE — Addendum Note (Signed)
Addended by: West Bali on: 01/16/2013 04:29 PM   Modules accepted: Orders

## 2013-01-16 NOTE — Telephone Encounter (Addendum)
PLEASE CALL PT.  Rx sent.   REVIEWED.

## 2013-01-16 NOTE — Telephone Encounter (Signed)
Called and informed pt. She does not want CT scheduled ASAP. Said she will wait and call tomorrow if she wants it scheduled. Said she had to go to town tomorrow and take care of some things whether she is able or not. Said she will call tomorrow if she wants the CT scheduled. She will also need prescription sent to [facility] for more Baclofen.

## 2013-01-16 NOTE — Telephone Encounter (Signed)
NOTED, I went ahead an got insurance approval incase she changes her mind to schedule

## 2013-01-16 NOTE — Telephone Encounter (Signed)
Pt called and wants to know what to do about the Baclofen. She has enough for today and tomorrow. She is afraid to keep taking it and she is afraid to stop abruptly. She said she just does not feel that all of her problem is coming from her stomach. She said she knows what gas is and it is not gas. She said she is not having reflux, she says the bitter taste in her mouth is bothering her so much. She said when she has the pain her her abd/chest right below her right breast it is a stabbing pain and she has almost went to the hospital for it, but she is not one to go to the hospital. She also said she does not want to see Dr. Fredderick Erb. She does not want someone to manage her pain, she wants to know what is causing the pain. I told her I will send a note to Dr, Darrick Penna today, she is at the hospital and I will call as soon as I hear back from her.

## 2013-01-17 MED ORDER — BACLOFEN 10 MG PO TABS: ORAL_TABLET | ORAL | Status: DC

## 2013-01-17 NOTE — Telephone Encounter (Signed)
LMOM to call.

## 2013-01-17 NOTE — Telephone Encounter (Signed)
Pt returned call and was informed.  

## 2013-01-17 NOTE — Telephone Encounter (Signed)
LMOM for pt to call. 

## 2013-01-17 NOTE — Telephone Encounter (Signed)
REVIEWED.  

## 2013-01-17 NOTE — Telephone Encounter (Signed)
PLEASE CALL PT. IF SHE IS AFRAID TO TAKE THE BACLOFEN THEN SHE SHOULD NOT TAKE IT.SHE SHOULD TAKE THE PROTONIX, LOSE WEIGHT, AND FOLLOW A LOW FAT DIET. HER REFLUX WILL BE BETTER CONTROLLED.  A PAIN CLINIC DOCTOR IS WHAT SHE NEEDS FOR RIB PAIN. SHE ONLY HAS TO SEE DR. DOONQUAH IF SHE WANTS TO.

## 2013-01-17 NOTE — Addendum Note (Signed)
Addended by: West Bali on: 01/17/2013 12:36 PM   Modules accepted: Orders

## 2013-01-17 NOTE — Telephone Encounter (Signed)
PLEASE CALL PT. RX SENT FOR BACLOFEN 10 MG WITH TAPER.

## 2013-01-17 NOTE — Telephone Encounter (Signed)
Pt returned call and was informed. She said she is about the same, but her pain is not doubling her over so she will wait on the CT. Said she may call and get it if she gets worse. She said the prescription for the Baclofen that was sent to the pharmacy was 20mg  and not 10mg  as she had previously. She would like for Rx for the 10mg  tablets to be sent to pharmacy and she wants to cut back on that prescription gradually.   Marland Kitchen

## 2013-02-14 ENCOUNTER — Encounter: Payer: Self-pay | Admitting: Gastroenterology

## 2013-02-15 ENCOUNTER — Other Ambulatory Visit: Payer: Self-pay | Admitting: Gastroenterology

## 2013-02-15 ENCOUNTER — Encounter: Payer: Self-pay | Admitting: Gastroenterology

## 2013-02-15 ENCOUNTER — Ambulatory Visit (INDEPENDENT_AMBULATORY_CARE_PROVIDER_SITE_OTHER): Payer: Medicare Other | Admitting: Gastroenterology

## 2013-02-15 VITALS — BP 140/73 | HR 100 | Temp 97.2°F | Ht 65.0 in | Wt 202.4 lb

## 2013-02-15 DIAGNOSIS — R110 Nausea: Secondary | ICD-10-CM

## 2013-02-15 DIAGNOSIS — R1011 Right upper quadrant pain: Secondary | ICD-10-CM

## 2013-02-15 MED ORDER — PANTOPRAZOLE SODIUM 40 MG PO TBEC: DELAYED_RELEASE_TABLET | ORAL | Status: DC

## 2013-02-15 NOTE — Assessment & Plan Note (Addendum)
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 Husband and pt have concerns out of proportion to pts presenting/chronic symptoms but decline to follow my recommendations. REFER TO GENERAL SURGERY FOR RUQ PAIN.  OPV IN 4 MOS

## 2013-02-15 NOTE — Progress Notes (Addendum)
Subjective:    Patient ID: Andrea Martinez, female    DOB: 1946/07/08, 67 y.o.   MRN: [id number]  PCP: HAWKINS  HPI HASN'T SEEN [facility] BECAUSE SHE CANCELLED APPT. WANTED TO SEE HOW THE BACLOFEN GONNA WORK. BACLOFEN DIDN'T HELP. GAVE GER HA. NO TYLENOL SINCE DEC. FELT MORE NAUSEATED WITH BACLOFEN. STILL HAS SHARP PAIN IN R SIDE. DECIDED SHE DOESN'T NEED PAIN MANAGEMENT/DOONQUAH. HAS PRAYED ABOUT HER SITUATION. HAS A BITTER TASTE. CONCERNED ABOUT RUQ PAIN. FEELS LIKE SHE BURNS IN WHEN SHE URINATES. LOST 4 LBS. CAN TOLERATE CHICKEN AND NON-FAT YOGURT. WANTS HER GB TESTED. TESTS IN DEC 2013 ?$7K BUT HASN'T GOT FINAL BILL. WAS CONSTIPATED AND THEN HAS DIARRHEA. WANTS TO EXERCISE BUT DOESN'T FEEL LIKE IT. CAN'T PAY FOR DEXILANT-$200/MO. PROTONIX IS WORKING.  HUSBAND CONCERNED. TALKED TO A MILLION PEOPLE AND THEY FEELS LIKE IT'S HER GALLBLADDER.  FEELS LIKE IF SHE LEANS OVER BITTER/GAS COMES OUT. PT DENIES FEVER, CHILLS, BRBPR, melena, problems swallowing, heartburn or indigestion.  Past Medical History  Diagnosis Date  . Gastric ulcer 2011    EGD, 5/11  . Hypothyroidism     not on meds, followed by Dr. [name]  . PONV (postoperative nausea and vomiting)   . Lipoma     left upper arm  . UTI (lower urinary tract infection)     frequent   Past Surgical History  Procedure Laterality Date  . Esophagogastroduodenoscopy  02/2010    gastric ulcers  . Partial hysterectomy    . Nose surgery    . Lipoma excision  08/02/2011    left shoulder  . Colonoscopy  01/2004    [facility], few small tics  . Esophagogastroduodenoscopy  12/04/2012    [id number] gastritis (inflammation) was found in the gastric antrum; multiple biopsies The duodenal mucosa showed no abnormalities in the bulb and second portion of the duodenum  . Bravo ph study  12/04/2012    Procedure: BRAVO PH STUDY;  Surgeon: West Bali, MD;  Location: [facility]O SUITE;  Service: Endoscopy;  Laterality: N/A;   Allergies  Allergen Reactions   . Codeine Nausea Only    Headache  . Esomeprazole Magnesium Nausea Only  . Sulfonamide Derivatives Nausea Only    Headache   Current Outpatient Prescriptions  Medication Sig Dispense Refill  . Cholecalciferol (VITAMIN D-3) 5000 UNITS TABS Take 5,000 Units by mouth daily.        Marland Kitchen estradiol (ESTRACE) 0.1 MG/GM vaginal cream Place 2 g vaginally 2 (two) times a week. Monday and thursday      . pantoprazole (PROTONIX) 40 MG tablet Take 1 tablet (40 mg total) by mouth 2 (two) times daily.    Marland Kitchen VIVELLE-DOT 0.075 MG/24HR Place 0.5 patches onto the skin 2 (two) times a week. Monday and thursday      .      Marland Kitchen         Review of Systems     Objective:   Physical Exam  Vitals reviewed. Constitutional: She is oriented to person, place, and time. She appears well-nourished. No distress.  HENT:  Head: Normocephalic and atraumatic.  Mouth/Throat: Oropharynx is clear and moist.  Eyes: Pupils are equal, round, and reactive to light. No scleral icterus.  Neck: Normal range of motion. Neck supple.  Cardiovascular: Normal rate, regular rhythm and normal heart sounds.   Pulmonary/Chest: Effort normal and breath sounds normal. No respiratory distress.  Abdominal: Soft. Bowel sounds are normal. She exhibits no distension. There is tenderness (mild ttp in  epigastrium). There is no rebound and no guarding.  OBESE  Musculoskeletal: Normal range of motion.  Lymphadenopathy:    She has no cervical adenopathy.  Neurological: She is alert and oriented to person, place, and time.  NO FOCAL DEFICITS   Psychiatric:  FLAT AFFECT, anxious MOOD           Assessment & Plan:

## 2013-02-15 NOTE — Patient Instructions (Addendum)
COMPLETE HIDA SCAN.  SEE DR. ZIEGLER TO ASSESS NEED FOR GALLBLADDER SURGERY.  CONTINUE PROTONIX AND LOW FAT DIET.  FOLLOW UP IN 4 MOS.  Patient has an appointment to see Dr. Leticia Penna on 02/27/13 at 9:45

## 2013-02-15 NOTE — Progress Notes (Signed)
Faxed to PCP

## 2013-02-15 NOTE — Assessment & Plan Note (Signed)
MOST LIKELY DUE TO NON-ACID REFLUX. PT UNABLE TO TOLERATE BACLOFEN.  CONTINUE PROTONIX BID. REFILL X1 YEAR. LOW FAT DIET OPV IN 4 MOS

## 2013-02-19 NOTE — Progress Notes (Signed)
Faxed to PCP

## 2013-02-27 ENCOUNTER — Other Ambulatory Visit (HOSPITAL_COMMUNITY): Payer: Self-pay | Admitting: General Surgery

## 2013-02-27 DIAGNOSIS — R1011 Right upper quadrant pain: Secondary | ICD-10-CM

## 2013-02-28 ENCOUNTER — Encounter (HOSPITAL_COMMUNITY)
Admission: RE | Admit: 2013-02-28 | Discharge: 2013-02-28 | Disposition: A | Discharge status: Home / Self Care | Payer: Medicare Other | Source: Ambulatory Visit | Attending: General Surgery | Admitting: General Surgery

## 2013-02-28 ENCOUNTER — Encounter (HOSPITAL_COMMUNITY): Payer: Self-pay

## 2013-02-28 DIAGNOSIS — R110 Nausea: Secondary | ICD-10-CM | POA: Insufficient documentation

## 2013-02-28 DIAGNOSIS — R1011 Right upper quadrant pain: Secondary | ICD-10-CM | POA: Insufficient documentation

## 2013-02-28 MED ORDER — TECHNETIUM TC 99M MEBROFENIN IV KIT
5.0000 | PACK | Freq: Once | INTRAVENOUS | Status: AC | PRN
  Administered 2013-02-28: 5 via INTRAVENOUS

## 2013-03-02 NOTE — Progress Notes (Signed)
Reminder in epic °

## 2013-05-03 ENCOUNTER — Other Ambulatory Visit: Payer: Self-pay | Admitting: Urgent Care

## 2013-08-16 ENCOUNTER — Other Ambulatory Visit (HOSPITAL_COMMUNITY): Payer: Self-pay | Admitting: Pulmonary Disease

## 2013-08-16 DIAGNOSIS — R109 Unspecified abdominal pain: Secondary | ICD-10-CM

## 2013-08-21 ENCOUNTER — Ambulatory Visit (HOSPITAL_COMMUNITY)
Admission: RE | Admit: 2013-08-21 | Discharge: 2013-08-21 | Disposition: A | Discharge status: Home / Self Care | Payer: Medicare Other | Source: Ambulatory Visit | Attending: Pulmonary Disease | Admitting: Pulmonary Disease

## 2013-08-21 DIAGNOSIS — R109 Unspecified abdominal pain: Secondary | ICD-10-CM

## 2013-08-21 DIAGNOSIS — K5730 Diverticulosis of large intestine without perforation or abscess without bleeding: Secondary | ICD-10-CM | POA: Insufficient documentation

## 2013-08-21 DIAGNOSIS — R1031 Right lower quadrant pain: Secondary | ICD-10-CM | POA: Insufficient documentation

## 2013-08-21 DIAGNOSIS — R110 Nausea: Secondary | ICD-10-CM | POA: Insufficient documentation

## 2013-10-09 ENCOUNTER — Other Ambulatory Visit (HOSPITAL_COMMUNITY): Payer: Self-pay | Admitting: Gastroenterology

## 2013-10-09 DIAGNOSIS — R1011 Right upper quadrant pain: Secondary | ICD-10-CM

## 2013-10-10 ENCOUNTER — Other Ambulatory Visit: Payer: Self-pay | Admitting: Gastroenterology

## 2013-10-10 DIAGNOSIS — R1011 Right upper quadrant pain: Secondary | ICD-10-CM

## 2013-10-15 ENCOUNTER — Other Ambulatory Visit: Payer: Medicare Other

## 2013-10-16 ENCOUNTER — Encounter (HOSPITAL_COMMUNITY): Payer: Medicare Other

## 2013-10-17 ENCOUNTER — Ambulatory Visit
Admission: RE | Admit: 2013-10-17 | Discharge: 2013-10-17 | Disposition: A | Discharge status: Home / Self Care | Payer: Medicare Other | Source: Ambulatory Visit | Attending: Gastroenterology | Admitting: Gastroenterology

## 2013-10-17 DIAGNOSIS — R1011 Right upper quadrant pain: Secondary | ICD-10-CM

## 2013-10-22 ENCOUNTER — Encounter: Payer: Self-pay | Admitting: Gastroenterology

## 2013-10-31 ENCOUNTER — Other Ambulatory Visit (HOSPITAL_COMMUNITY): Payer: Self-pay | Admitting: Gynecology

## 2013-10-31 DIAGNOSIS — Z139 Encounter for screening, unspecified: Secondary | ICD-10-CM

## 2013-11-02 ENCOUNTER — Ambulatory Visit (INDEPENDENT_AMBULATORY_CARE_PROVIDER_SITE_OTHER): Payer: Medicare Other | Admitting: General Surgery

## 2013-11-02 ENCOUNTER — Encounter (INDEPENDENT_AMBULATORY_CARE_PROVIDER_SITE_OTHER): Payer: Self-pay | Admitting: General Surgery

## 2013-11-02 VITALS — BP 142/80 | HR 72 | Temp 97.0°F | Resp 12 | Ht 66.0 in | Wt 203.4 lb

## 2013-11-02 DIAGNOSIS — R1011 Right upper quadrant pain: Secondary | ICD-10-CM

## 2013-11-02 NOTE — Patient Instructions (Signed)
The exact cause of your right-sided abdominal pain and back pain are unclear. You have had an extensive workup in the past and recently. All of the gallbladder tests are normal. The only abnormality was ulcer  disease in the past.  I advised against a gallbladder operation due to the lack of evidence that it would benefit you.  I advised that you return to your primary care physician, Dr. Juanetta Gosling, and request a referral to one of the tertiary care center such as [facility], [place], or [facility] and be seen by a gastroenterologist to see if they have any other idea about what the cause of your pain is.

## 2013-11-02 NOTE — Progress Notes (Signed)
Patient ID: Andrea Martinez, female   DOB: 1946/07/25, 66 y.o.   MRN: [id number]  Chief Complaint  Patient presents with  . Abdominal Pain    HPI Andrea Martinez is a 67 y.o. female.  She is referred by [name] or right upper quadrant pain. Andrea Martinez Stable is her primary care physician. She has also seen [name](GI) in the past.  4 years ago she was having terrible epigastric pain. She states that [name] performed endoscopy and found that she had 4  or 5 ulcers. That was treated and apparently these have healed. For the past 2 years she's had daily right upper quadrant pain, twisting and tearing in character, radiating to her back. She has nausea but no vomiting. No change in her bowel habits. She has been gaining weight. She states the pain has no trigger. Sometimes occurs in the morning before breakfast. Sometimes it occurs after meals. She and her husband are here together and both of them are very emphatic that something must be done or they Martinez have to go to the emergency room.  In December 2013, [name] referred her to [facility] because of the uncertain diagnosis. The patient states that she did not go, thinking it was not necessary. In March of 2014 she saw Dr. [name] who performed a hepatobiliary scan which was completely normal with ejection fraction of 74%. Apparently she was advised not to have surgery. She's had a CT scan of the abd. & pelvis which is unremarkable. His gallbladder ultrasound in November of this year which is completely normal. CBC and complete metabolic panel were unremarkable on 10/09/2013.  She saw Dr. [name] on 10/22/2013 for upper endoscopy. Biopsies showed were negative for H. Pylori. She had mild gastritis.  Comorbidities reveal peptic ulcer disease by history. Vaginal hysterectomy for benign disease. She denies any history of liver disease urologic problems cardiac disease diabetes or pulmonary disease. Abdominal Pain Associated symptoms: nausea    Associated symptoms: no chest pain, no chills, no constipation, no cough, no diarrhea, no fever, no hematuria, no sore throat, no vaginal bleeding and no vomiting     Past Medical History  Diagnosis Date  . Gastric ulcer 2011    EGD, 5/11  . Hypothyroidism     not on meds, followed by Dr. [name]  . PONV (postoperative nausea and vomiting)   . Lipoma     left upper arm  . UTI (lower urinary tract infection)     frequent    Past Surgical History  Procedure Laterality Date  . Esophagogastroduodenoscopy  02/2010    gastric ulcers  . Partial hysterectomy    . Nose surgery    . Lipoma excision  08/02/2011    left shoulder  . Colonoscopy  01/2004    [name], few small tics  . Esophagogastroduodenoscopy  12/04/2012    [name] gastritis (inflammation) was found in the gastric antrum; multiple biopsies The duodenal mucosa showed no abnormalities in the bulb and second portion of the duodenum  . Bravo ph study  12/04/2012    Procedure: BRAVO PH STUDY;  Surgeon: West Bali, MD;  Location: [facility];  Service: Endoscopy;  Laterality: N/A;    Family History  Problem Relation Age of Onset  . Lung cancer Father   . COPD Mother   . Colon cancer Neg Hx   . Anesthesia problems Neg Hx   . Hypotension Neg Hx   . Malignant hyperthermia Neg Hx   .  Pseudochol deficiency Neg Hx     Social History History  Substance Use Topics  . Smoking status: Never Smoker   . Smokeless tobacco: Never Used  . Alcohol Use: No    Allergies  Allergen Reactions  . Codeine Nausea Only    Headache  . Esomeprazole Magnesium Nausea Only  . Sulfonamide Derivatives Nausea Only    Headache    Current Outpatient Prescriptions  Medication Sig Dispense Refill  . acetaminophen (TYLENOL) 500 MG tablet Take 500 mg by mouth every 6 (six) hours as needed.      . Cholecalciferol (VITAMIN D-3) 5000 UNITS TABS Take 5,000 Units by mouth daily.        Marland Kitchen conjugated estrogens (PREMARIN) vaginal cream  Place 1 Applicatorful vaginally 2 (two) times a week. 1/2 to 1g      . estradiol (CLIMARA - DOSED IN MG/24 HR) 0.0375 mg/24hr patch Place 0.0375 mg onto the skin once a week.      . pantoprazole (PROTONIX) 40 MG tablet TAKE  (1)  TABLET TWICE A DAY.  60 tablet  11   No current facility-administered medications for this visit.    Review of Systems Review of Systems  Constitutional: Negative for fever, chills and unexpected weight change.  HENT: Negative for congestion, hearing loss, sore throat, trouble swallowing and voice change.   Eyes: Negative for visual disturbance.  Respiratory: Negative for cough and wheezing.   Cardiovascular: Negative for chest pain, palpitations and leg swelling.  Gastrointestinal: Positive for nausea and abdominal pain. Negative for vomiting, diarrhea, constipation, blood in stool, abdominal distention and anal bleeding.  Genitourinary: Negative for hematuria, vaginal bleeding and difficulty urinating.  Musculoskeletal: Positive for back pain. Negative for arthralgias.  Skin: Negative for rash and wound.  Neurological: Negative for seizures, syncope and headaches.  Hematological: Negative for adenopathy. Does not bruise/bleed easily.  Psychiatric/Behavioral: Negative for confusion. The patient is nervous/anxious.     Blood pressure 142/80, pulse 72, temperature 97 F (36.1 C), temperature source Oral, resp. rate 12, height 5\' 6"  (1.676 m), weight 203 lb 6 oz (92.25 kg).  Physical Exam Physical Exam  Constitutional: She is oriented to person, place, and time. She appears well-developed and well-nourished. No distress.  HENT:  Head: Normocephalic and atraumatic.  Nose: Nose normal.  Mouth/Throat: No oropharyngeal exudate.  Eyes: Conjunctivae and EOM are normal. Pupils are equal, round, and reactive to light. Left eye exhibits no discharge. No scleral icterus.  Neck: Neck supple. No JVD present. No tracheal deviation present. No thyromegaly present.   Cardiovascular: Normal rate, regular rhythm, normal heart sounds and intact distal pulses.   No murmur heard. Pulmonary/Chest: Effort normal and breath sounds normal. No respiratory distress. She has no wheezes. She has no rales. She exhibits no tenderness.  Abdominal: Soft. Bowel sounds are normal. She exhibits no distension and no mass. There is no tenderness. There is no rebound and no guarding.  Abdominal exam is essentially benign. Soft. Nontender. No guarding. No mass or organomegaly. The ribs are nontender. No tenderness to percuss the back.  Musculoskeletal: She exhibits no edema and no tenderness.  Lymphadenopathy:    She has no cervical adenopathy.  Neurological: She is alert and oriented to person, place, and time. She exhibits normal muscle tone. Coordination normal.  Skin: Skin is warm. No rash noted. She is not diaphoretic. No erythema. No pallor.  Psychiatric: She has a normal mood and affect. Her behavior is normal. Judgment and thought content normal.    Data  Reviewed Numerous office notes from other physicians. Numerous imaging studies. Endoscopy reports. Laboratory tests.  Assessment    2 year history of right upper quadrant pain. Exhaustive workup reveals no evidence of gallbladder or biliary tract disease. I do not think she would benefit from cholecystectomy.  History of gastric ulcers and gastritis. Relatively quiescent at present  Status post vaginal hysterectomy     Plan    I spent a very long time talking to the patient and her husband. I advised strongly against cholecystectomy due to the lack of evidence that it would benefit her. I discussed this in terms of risk and benefit. Control of symptoms, likelihood of clarification of her diagnosis. I tried very hard to explain this to them. They were very emphatic that something must be done and did not seem very accepting of my advice.  I offered to refer them to one of the [facility] centers. . They seemed  disinclined at this time.   She has analgesic medication that Dr. Kinnie Scales gave her.  Return to Dr. Shaune Pollack for further discussion of possible referral to a tertiary care gastroenterologist       Angelia Mould. Derrell Lolling, M.D., [facility] General and Minimally invasive Surgery Breast and Colorectal Surgery Office:   [phone] Pager:   [phone]  11/02/2013, 2:48 PM

## 2013-11-06 ENCOUNTER — Ambulatory Visit (HOSPITAL_COMMUNITY)
Admission: RE | Admit: 2013-11-06 | Discharge: 2013-11-06 | Disposition: A | Discharge status: Home / Self Care | Payer: Medicare Other | Source: Ambulatory Visit | Attending: Gynecology | Admitting: Gynecology

## 2013-11-06 DIAGNOSIS — Z1231 Encounter for screening mammogram for malignant neoplasm of breast: Secondary | ICD-10-CM | POA: Insufficient documentation

## 2013-11-06 DIAGNOSIS — Z139 Encounter for screening, unspecified: Secondary | ICD-10-CM

## 2013-11-12 ENCOUNTER — Ambulatory Visit (INDEPENDENT_AMBULATORY_CARE_PROVIDER_SITE_OTHER): Payer: Medicare Other | Admitting: General Surgery

## 2013-12-04 ENCOUNTER — Encounter (INDEPENDENT_AMBULATORY_CARE_PROVIDER_SITE_OTHER): Payer: Self-pay

## 2013-12-04 ENCOUNTER — Encounter (INDEPENDENT_AMBULATORY_CARE_PROVIDER_SITE_OTHER): Payer: Self-pay | Admitting: Surgery

## 2013-12-04 ENCOUNTER — Ambulatory Visit (INDEPENDENT_AMBULATORY_CARE_PROVIDER_SITE_OTHER): Payer: Medicare Other | Admitting: Surgery

## 2013-12-04 VITALS — BP 132/80 | HR 70 | Temp 98.5°F | Resp 16 | Ht 66.0 in | Wt 204.8 lb

## 2013-12-04 DIAGNOSIS — K828 Other specified diseases of gallbladder: Secondary | ICD-10-CM | POA: Insufficient documentation

## 2013-12-04 NOTE — Progress Notes (Signed)
Subjective:     Patient ID: Andrea Martinez, female   DOB: 03/20/1946, 67 y.o.   MRN: [id number]  HPI Patient presents for second opinion for right upper quadrant pain in her abdomen. She has been seen by multiple physicians including Dr. Ingram here at [facility]. She's had a two-year history of intermittent right upper quadrant pain. This has been associated with nausea. It sometimes is worsened by eating fatty received these. Ultrasound was done which is normal. CT scan of abdomen done which was normal. HIDA study performed with normal ejection fraction. She states she had pain during the test after they gave her inch or drink. She's had multiple endoscopies which have been unrevealing. She continues to have right upper quadrant pain nausea and vomiting intermittently. She has seen 2 surgeons who have felt that surgery to remove her gallbladder would not be helpful. Have offered her referral to tertiary care which she has chosen against. She is asking for a third opinion about cholecystectomy to see if this would help her pain. She is quite miserable.  Review of Systems  Gastrointestinal: Positive for nausea, vomiting and abdominal pain.  Musculoskeletal: Negative.   Psychiatric/Behavioral: Positive for dysphoric mood.       Objective:   Physical Exam  Constitutional: She is oriented to person, place, and time. She appears well-developed and well-nourished.  HENT:  Head: Normocephalic and atraumatic.  Eyes: Pupils are equal, round, and reactive to light. No scleral icterus.  Neck: Normal range of motion.  Cardiovascular: Normal rate.   Pulmonary/Chest: Effort normal.  Abdominal: Soft. She exhibits no distension. There is no tenderness.  Neurological: She is alert and oriented to person, place, and time.  Skin: Skin is warm and dry.  Psychiatric: She has a normal mood and affect. Her behavior is normal. Judgment and thought content normal.       Assessment:     Right upper  quadrant abdominal pain question dyskinesia with pain during HIDA scan after ensure    Plan:     I long discussion with the patient after reviewing all of her testing and notes from all the physicians. I stated the likelihood that cholecystectomy would help her is about 5% at relieving her symptoms. Risks, benefits and alternatives discussed with her at great length. At this point in time, she would like to proceed with cholecystectomy since her extensive exhausting workup has been negative for source. Given that she's had pain with her HIDA study, there may be some benefit.The procedure has been discussed with the patient. Operative and non operative treatments have been discussed. Risks of surgery include bleeding, infection,  Common bile duct injury,  Injury to the stomach,liver, colon,small intestine, abdominal wall,  Diaphragm,  Major blood vessels,  And the need for an open procedure.  Other risks include worsening of medical problems, death,  DVT and pulmonary embolism, and cardiovascular events.   Medical options have also been discussed. The patient has been informed of long term expectations of surgery and non surgical options,  The patient agrees to proceed.        

## 2013-12-04 NOTE — Patient Instructions (Signed)

## 2013-12-07 ENCOUNTER — Encounter (INDEPENDENT_AMBULATORY_CARE_PROVIDER_SITE_OTHER): Payer: Self-pay

## 2013-12-27 ENCOUNTER — Encounter (HOSPITAL_BASED_OUTPATIENT_CLINIC_OR_DEPARTMENT_OTHER): Payer: Self-pay | Admitting: *Deleted

## 2013-12-27 NOTE — Progress Notes (Signed)
Pt has had ruq pain for several months- She is ready for this To go to [facility] for [facility] labs

## 2013-12-28 ENCOUNTER — Encounter (INDEPENDENT_AMBULATORY_CARE_PROVIDER_SITE_OTHER): Payer: Self-pay

## 2013-12-28 ENCOUNTER — Encounter (HOSPITAL_BASED_OUTPATIENT_CLINIC_OR_DEPARTMENT_OTHER): Payer: Self-pay | Admitting: *Deleted

## 2013-12-31 ENCOUNTER — Encounter (HOSPITAL_COMMUNITY)
Admission: RE | Admit: 2013-12-31 | Discharge: 2013-12-31 | Disposition: A | Discharge status: Home / Self Care | Payer: Medicare Other | Source: Ambulatory Visit | Attending: Surgery | Admitting: Surgery

## 2013-12-31 LAB — CBC WITH DIFFERENTIAL/PLATELET
BASOS PCT: 0 % (ref 0–1)
Basophils Absolute: 0 10*3/uL (ref 0.0–0.1)
Eosinophils Absolute: 0.1 10*3/uL (ref 0.0–0.7)
Eosinophils Relative: 1 % (ref 0–5)
HEMATOCRIT: 40.7 % (ref 36.0–46.0)
HEMOGLOBIN: 13.1 g/dL (ref 12.0–15.0)
Lymphocytes Relative: 21 % (ref 12–46)
Lymphs Abs: 1.8 10*3/uL (ref 0.7–4.0)
MCH: 28.8 pg (ref 26.0–34.0)
MCHC: 32.2 g/dL (ref 30.0–36.0)
MCV: 89.5 fL (ref 78.0–100.0)
MONO ABS: 0.7 10*3/uL (ref 0.1–1.0)
MONOS PCT: 9 % (ref 3–12)
NEUTROS ABS: 5.8 10*3/uL (ref 1.7–7.7)
Neutrophils Relative %: 70 % (ref 43–77)
Platelets: 260 10*3/uL (ref 150–400)
RBC: 4.55 MIL/uL (ref 3.87–5.11)
RDW: 12.9 % (ref 11.5–15.5)
WBC: 8.4 10*3/uL (ref 4.0–10.5)

## 2013-12-31 LAB — COMPREHENSIVE METABOLIC PANEL
ALBUMIN: 4 g/dL (ref 3.5–5.2)
ALT: 13 U/L (ref 0–35)
AST: 19 U/L (ref 0–37)
Alkaline Phosphatase: 144 U/L — ABNORMAL HIGH (ref 39–117)
BILIRUBIN TOTAL: 0.4 mg/dL (ref 0.3–1.2)
BUN: 22 mg/dL (ref 6–23)
CHLORIDE: 103 meq/L (ref 96–112)
CO2: 27 mEq/L (ref 19–32)
CREATININE: 0.8 mg/dL (ref 0.50–1.10)
Calcium: 9.5 mg/dL (ref 8.4–10.5)
GFR calc Af Amer: 86 mL/min — ABNORMAL LOW (ref >90)
GFR calc non Af Amer: 74 mL/min — ABNORMAL LOW (ref >90)
Glucose, Bld: 102 mg/dL — ABNORMAL HIGH (ref 70–99)
POTASSIUM: 4.6 meq/L (ref 3.7–5.3)
Sodium: 141 mEq/L (ref 137–147)
Total Protein: 7.5 g/dL (ref 6.0–8.3)

## 2014-01-03 ENCOUNTER — Ambulatory Visit (HOSPITAL_BASED_OUTPATIENT_CLINIC_OR_DEPARTMENT_OTHER): Payer: Medicare Other | Admitting: Certified Registered"

## 2014-01-03 ENCOUNTER — Encounter (HOSPITAL_BASED_OUTPATIENT_CLINIC_OR_DEPARTMENT_OTHER): Payer: Medicare Other | Admitting: Certified Registered"

## 2014-01-03 ENCOUNTER — Ambulatory Visit (HOSPITAL_COMMUNITY): Payer: Medicare Other

## 2014-01-03 ENCOUNTER — Encounter (HOSPITAL_BASED_OUTPATIENT_CLINIC_OR_DEPARTMENT_OTHER): Payer: Self-pay | Admitting: Certified Registered"

## 2014-01-03 ENCOUNTER — Ambulatory Visit (HOSPITAL_BASED_OUTPATIENT_CLINIC_OR_DEPARTMENT_OTHER)
Admission: RE | Admit: 2014-01-03 | Discharge: 2014-01-03 | Disposition: A | Discharge status: Home / Self Care | Payer: Medicare Other | Source: Ambulatory Visit | Attending: Surgery | Admitting: Surgery

## 2014-01-03 ENCOUNTER — Encounter (HOSPITAL_BASED_OUTPATIENT_CLINIC_OR_DEPARTMENT_OTHER)
Admission: RE | Disposition: A | Discharge status: Home / Self Care | Payer: Self-pay | Source: Ambulatory Visit | Attending: Surgery

## 2014-01-03 DIAGNOSIS — K824 Cholesterolosis of gallbladder: Secondary | ICD-10-CM

## 2014-01-03 DIAGNOSIS — K811 Chronic cholecystitis: Secondary | ICD-10-CM

## 2014-01-03 DIAGNOSIS — E039 Hypothyroidism, unspecified: Secondary | ICD-10-CM | POA: Insufficient documentation

## 2014-01-03 DIAGNOSIS — Z01812 Encounter for preprocedural laboratory examination: Secondary | ICD-10-CM | POA: Insufficient documentation

## 2014-01-03 DIAGNOSIS — K828 Other specified diseases of gallbladder: Secondary | ICD-10-CM

## 2014-01-03 HISTORY — PX: CHOLECYSTECTOMY: SHX55

## 2014-01-03 SURGERY — LAPAROSCOPIC CHOLECYSTECTOMY WITH INTRAOPERATIVE CHOLANGIOGRAM
Anesthesia: General | Site: Abdomen

## 2014-01-03 MED ORDER — TRAMADOL HCL 50 MG PO TABS: 50.0000 mg | ORAL_TABLET | Freq: Four times a day (QID) | ORAL | Status: DC | PRN

## 2014-01-03 MED ORDER — OXYCODONE HCL 5 MG PO TABS: 5.0000 mg | ORAL_TABLET | Freq: Once | ORAL | Status: DC | PRN

## 2014-01-03 MED ORDER — GLYCOPYRROLATE 0.2 MG/ML IJ SOLN
INTRAMUSCULAR | Status: DC | PRN
  Administered 2014-01-03: 0.6 mg via INTRAVENOUS

## 2014-01-03 MED ORDER — NEOSTIGMINE METHYLSULFATE 1 MG/ML IJ SOLN
INTRAMUSCULAR | Status: DC | PRN
  Administered 2014-01-03: 4 mg via INTRAVENOUS

## 2014-01-03 MED ORDER — SCOPOLAMINE 1 MG/3DAYS TD PT72
1.0000 | MEDICATED_PATCH | Freq: Once | TRANSDERMAL | Status: DC
  Administered 2014-01-03: 1.5 mg via TRANSDERMAL

## 2014-01-03 MED ORDER — FENTANYL CITRATE 0.05 MG/ML IJ SOLN: 50.0000 ug | Freq: Once | INTRAMUSCULAR | Status: DC

## 2014-01-03 MED ORDER — FENTANYL CITRATE 0.05 MG/ML IJ SOLN
INTRAMUSCULAR | Status: DC | PRN
  Administered 2014-01-03: 50 ug via INTRAVENOUS
  Administered 2014-01-03 (×3): 25 ug via INTRAVENOUS

## 2014-01-03 MED ORDER — PROPOFOL 10 MG/ML IV BOLUS
INTRAVENOUS | Status: DC | PRN
  Administered 2014-01-03: 200 mg via INTRAVENOUS

## 2014-01-03 MED ORDER — OXYCODONE HCL 5 MG/5ML PO SOLN: 5.0000 mg | Freq: Once | ORAL | Status: DC | PRN

## 2014-01-03 MED ORDER — PROMETHAZINE HCL 25 MG/ML IJ SOLN
INTRAMUSCULAR | Status: AC
  Filled 2014-01-03: qty 1

## 2014-01-03 MED ORDER — PROPOFOL INFUSION 10 MG/ML OPTIME
INTRAVENOUS | Status: DC | PRN
  Administered 2014-01-03: 140 ug/kg/min via INTRAVENOUS

## 2014-01-03 MED ORDER — ROCURONIUM BROMIDE 100 MG/10ML IV SOLN
INTRAVENOUS | Status: DC | PRN
  Administered 2014-01-03: 50 mg via INTRAVENOUS

## 2014-01-03 MED ORDER — DEXTROSE 5 % IV SOLN
3.0000 g | INTRAVENOUS | Status: AC
  Administered 2014-01-03: 3 g via INTRAVENOUS

## 2014-01-03 MED ORDER — IOHEXOL 300 MG/ML  SOLN
INTRAMUSCULAR | Status: DC | PRN
  Administered 2014-01-03: 14:00:00

## 2014-01-03 MED ORDER — DEXAMETHASONE SODIUM PHOSPHATE 4 MG/ML IJ SOLN
INTRAMUSCULAR | Status: DC | PRN
  Administered 2014-01-03: 10 mg via INTRAVENOUS

## 2014-01-03 MED ORDER — SODIUM CHLORIDE 0.9 % IJ SOLN
INTRAMUSCULAR | Status: AC
  Filled 2014-01-03: qty 10

## 2014-01-03 MED ORDER — PROMETHAZINE HCL 12.5 MG PO TABS: 12.5000 mg | ORAL_TABLET | Freq: Four times a day (QID) | ORAL | Status: DC | PRN

## 2014-01-03 MED ORDER — LIDOCAINE HCL (CARDIAC) 20 MG/ML IV SOLN
INTRAVENOUS | Status: DC | PRN
  Administered 2014-01-03: 60 mg via INTRAVENOUS

## 2014-01-03 MED ORDER — ROCURONIUM BROMIDE 50 MG/5ML IV SOLN
INTRAVENOUS | Status: AC
  Filled 2014-01-03: qty 1

## 2014-01-03 MED ORDER — CHLORHEXIDINE GLUCONATE 4 % EX LIQD: 1.0000 "application " | Freq: Once | CUTANEOUS | Status: DC

## 2014-01-03 MED ORDER — PROMETHAZINE HCL 25 MG/ML IJ SOLN
6.2500 mg | INTRAMUSCULAR | Status: DC | PRN
  Administered 2014-01-03: 12.5 mg via INTRAVENOUS

## 2014-01-03 MED ORDER — BUPIVACAINE-EPINEPHRINE 0.25% -1:200000 IJ SOLN
INTRAMUSCULAR | Status: DC | PRN
  Administered 2014-01-03: 8 mL

## 2014-01-03 MED ORDER — MIDAZOLAM HCL 2 MG/2ML IJ SOLN: 1.0000 mg | INTRAMUSCULAR | Status: DC | PRN

## 2014-01-03 MED ORDER — HYDROCODONE-ACETAMINOPHEN 5-325 MG PO TABS: 1.0000 | ORAL_TABLET | Freq: Four times a day (QID) | ORAL | Status: DC | PRN

## 2014-01-03 MED ORDER — HYDROMORPHONE HCL PF 1 MG/ML IJ SOLN
0.2500 mg | INTRAMUSCULAR | Status: DC | PRN
  Administered 2014-01-03 (×4): 0.5 mg via INTRAVENOUS

## 2014-01-03 MED ORDER — BUPIVACAINE-EPINEPHRINE PF 0.25-1:200000 % IJ SOLN
INTRAMUSCULAR | Status: AC
  Filled 2014-01-03: qty 30

## 2014-01-03 MED ORDER — FENTANYL CITRATE 0.05 MG/ML IJ SOLN: 50.0000 ug | INTRAMUSCULAR | Status: DC | PRN

## 2014-01-03 MED ORDER — CEFAZOLIN SODIUM 1-5 GM-% IV SOLN
INTRAVENOUS | Status: AC
  Filled 2014-01-03: qty 50

## 2014-01-03 MED ORDER — FENTANYL CITRATE 0.05 MG/ML IJ SOLN
INTRAMUSCULAR | Status: AC
  Filled 2014-01-03: qty 6

## 2014-01-03 MED ORDER — SODIUM CHLORIDE 0.9 % IR SOLN
Status: DC | PRN
  Administered 2014-01-03: 1

## 2014-01-03 MED ORDER — SCOPOLAMINE 1 MG/3DAYS TD PT72
MEDICATED_PATCH | TRANSDERMAL | Status: AC
  Filled 2014-01-03: qty 1

## 2014-01-03 MED ORDER — CEFAZOLIN SODIUM-DEXTROSE 2-3 GM-% IV SOLR
INTRAVENOUS | Status: AC
  Filled 2014-01-03: qty 50

## 2014-01-03 MED ORDER — LACTATED RINGERS IV SOLN
INTRAVENOUS | Status: DC
  Administered 2014-01-03 (×2): via INTRAVENOUS

## 2014-01-03 MED ORDER — HYDROMORPHONE HCL PF 1 MG/ML IJ SOLN
INTRAMUSCULAR | Status: AC
  Filled 2014-01-03: qty 1

## 2014-01-03 MED ORDER — ONDANSETRON HCL 4 MG/2ML IJ SOLN
INTRAMUSCULAR | Status: DC | PRN
  Administered 2014-01-03: 4 mg via INTRAVENOUS

## 2014-01-03 SURGICAL SUPPLY — 47 items
APPLIER CLIP ROT 10 11.4 M/L (STAPLE) ×3
APR CLP MED LRG 11.4X10 (STAPLE) ×1
BLADE SURG ROTATE 9660 (MISCELLANEOUS) IMPLANT
CANISTER SUCT 1200ML W/VALVE (MISCELLANEOUS) ×3 IMPLANT
CHLORAPREP W/TINT 26ML (MISCELLANEOUS) ×3 IMPLANT
CLIP APPLIE ROT 10 11.4 M/L (STAPLE) ×1 IMPLANT
COVER MAYO STAND STRL (DRAPES) ×3 IMPLANT
DECANTER SPIKE VIAL GLASS SM (MISCELLANEOUS) IMPLANT
DERMABOND ADVANCED (GAUZE/BANDAGES/DRESSINGS) ×2
DERMABOND ADVANCED .7 DNX12 (GAUZE/BANDAGES/DRESSINGS) ×1 IMPLANT
DRAPE C-ARM 42X72 X-RAY (DRAPES) ×3 IMPLANT
DRAPE UTILITY XL STRL (DRAPES) ×3 IMPLANT
ELECT REM PT RETURN 9FT ADLT (ELECTROSURGICAL) ×3
ELECTRODE REM PT RTRN 9FT ADLT (ELECTROSURGICAL) ×1 IMPLANT
FILTER SMOKE EVAC LAPAROSHD (FILTER) ×3 IMPLANT
GLOVE BIO SURGEON STRL SZ8 (GLOVE) ×3 IMPLANT
GLOVE BIOGEL PI IND STRL 6.5 (GLOVE) ×1 IMPLANT
GLOVE BIOGEL PI IND STRL 7.0 (GLOVE) ×1 IMPLANT
GLOVE BIOGEL PI IND STRL 8 (GLOVE) ×1 IMPLANT
GLOVE BIOGEL PI INDICATOR 6.5 (GLOVE) ×2
GLOVE BIOGEL PI INDICATOR 7.0 (GLOVE) ×2
GLOVE BIOGEL PI INDICATOR 8 (GLOVE) ×2
GLOVE ECLIPSE 6.5 STRL STRAW (GLOVE) ×3 IMPLANT
GLOVE ECLIPSE 7.0 STRL STRAW (GLOVE) ×3 IMPLANT
GLOVE EXAM NITRILE MD LF STRL (GLOVE) ×3 IMPLANT
GOWN STRL REUS W/ TWL LRG LVL3 (GOWN DISPOSABLE) ×2 IMPLANT
GOWN STRL REUS W/TWL LRG LVL3 (GOWN DISPOSABLE) ×4
HEMOSTAT SNOW SURGICEL 2X4 (HEMOSTASIS) ×3 IMPLANT
LINER CANISTER 1000CC FLEX (MISCELLANEOUS) ×3 IMPLANT
NS IRRIG 1000ML POUR BTL (IV SOLUTION) IMPLANT
PACK BASIN DAY SURGERY FS (CUSTOM PROCEDURE TRAY) ×3 IMPLANT
POUCH SPECIMEN RETRIEVAL 10MM (ENDOMECHANICALS) ×3 IMPLANT
SCISSORS LAP 5X35 DISP (ENDOMECHANICALS) IMPLANT
SET CHOLANGIOGRAPH 5 50 .035 (SET/KITS/TRAYS/PACK) ×3 IMPLANT
SET IRRIG TUBING LAPAROSCOPIC (IRRIGATION / IRRIGATOR) ×3 IMPLANT
SLEEVE ENDOPATH XCEL 5M (ENDOMECHANICALS) ×3 IMPLANT
SLEEVE SCD COMPRESS KNEE MED (MISCELLANEOUS) ×3 IMPLANT
SUT MNCRL AB 4-0 PS2 18 (SUTURE) ×3 IMPLANT
SUT VICRYL 0 UR6 27IN ABS (SUTURE) IMPLANT
TOWEL OR 17X24 6PK STRL BLUE (TOWEL DISPOSABLE) ×3 IMPLANT
TOWEL OR NON WOVEN STRL DISP B (DISPOSABLE) ×3 IMPLANT
TRAY LAPAROSCOPIC (CUSTOM PROCEDURE TRAY) ×3 IMPLANT
TROCAR XCEL BLUNT TIP 100MML (ENDOMECHANICALS) ×3 IMPLANT
TROCAR XCEL NON-BLD 11X100MML (ENDOMECHANICALS) ×3 IMPLANT
TROCAR XCEL NON-BLD 5MMX100MML (ENDOMECHANICALS) ×3 IMPLANT
TUBE CONNECTING 20'X1/4 (TUBING) ×1
TUBE CONNECTING 20X1/4 (TUBING) ×2 IMPLANT

## 2014-01-03 NOTE — Anesthesia Postprocedure Evaluation (Signed)
  Anesthesia Post-op Note  Patient: [place]  Procedure(s) Performed: Procedure(s): LAPAROSCOPIC CHOLECYSTECTOMY WITH INTRAOPERATIVE CHOLANGIOGRAM (N/A)  Patient Location: PACU  Anesthesia Type:General  Level of Consciousness: awake and alert   Airway and Oxygen Therapy: Patient Spontanous Breathing  Post-op Pain: mild  Post-op Assessment: Post-op Vital signs reviewed, Patient's Cardiovascular Status Stable, Respiratory Function Stable, Patent Airway, No signs of Nausea or vomiting and Pain level controlled  Post-op Vital Signs: Reviewed and stable  Complications: No apparent anesthesia complications

## 2014-01-03 NOTE — Anesthesia Procedure Notes (Signed)
Procedure Name: Intubation Date/Time: 01/03/2014 1:36 PM Performed by: Oneda Duffett Pre-anesthesia Checklist: Patient identified, Emergency Drugs available, Suction available and Patient being monitored Patient Re-evaluated:Patient Re-evaluated prior to inductionOxygen Delivery Method: Circle System Utilized Preoxygenation: Pre-oxygenation with 100% oxygen Intubation Type: IV induction Ventilation: Mask ventilation without difficulty Laryngoscope Size: Mac and 3 Grade View: Grade I Tube type: Oral Number of attempts: 1 Airway Equipment and Method: stylet and LTA kit utilized Placement Confirmation: ETT inserted through vocal cords under direct vision,  positive ETCO2 and breath sounds checked- equal and bilateral Tube secured with: Tape Dental Injury: Teeth and Oropharynx as per pre-operative assessment

## 2014-01-03 NOTE — Op Note (Signed)
Laparoscopic Cholecystectomy with IOC Procedure Note  Indications: This patient presents with symptomatic gallbladder disease and will undergo laparoscopic cholecystectomy.The procedure has been discussed with the patient. Operative and non operative treatments have been discussed. Risks of surgery include bleeding, infection,  Common bile duct injury,  Injury to the stomach,liver, colon,small intestine, abdominal wall,  Diaphragm,  Major blood vessels,  And the need for an open procedure.  Other risks include worsening of medical problems, death,  DVT and pulmonary embolism, and cardiovascular events.   Medical options have also been discussed. The patient has been informed of long term expectations of surgery and non surgical options,  The patient agrees to proceed.  Success of surgery for this condition is 5 %.  Pt is aware of this and agrees to proceed.   Pre-operative Diagnosis: Abdominal pain, epigastric  Post-operative Diagnosis: Same  Surgeon: Preston Garabedian A.   Assistants: OR staff  Anesthesia: General endotracheal anesthesia and Local anesthesia 0.25.% bupivacaine, with epinephrine  ASA Class: 2  Procedure Details  The patient was seen again in the Holding Room. The risks, benefits, complications, treatment options, and expected outcomes were discussed with the patient. The possibilities of reaction to medication, pulmonary aspiration, perforation of viscus, bleeding, recurrent infection, finding a normal gallbladder, the need for additional procedures, failure to diagnose a condition, the possible need to convert to an open procedure, and creating a complication requiring transfusion or operation were discussed with the patient. The patient and/or family concurred with the proposed plan, giving informed consent. The site of surgery properly noted/marked. The patient was taken to Operating Room, identified as Andrea Martinez and the procedure verified as Laparoscopic Cholecystectomy with  Intraoperative Cholangiograms. A Time Out was held and the above information confirmed.  Prior to the induction of general anesthesia, antibiotic prophylaxis was administered. General endotracheal anesthesia was then administered and tolerated well. After the induction, the abdomen was prepped in the usual sterile fashion. The patient was positioned in the supine position with the left arm comfortably tucked, along with some reverse Trendelenburg.  Local anesthetic agent was injected into the skin near the umbilicus and an incision made. The midline fascia was incised and the Hasson technique was used to introduce a 12 mm port under direct vision. It was secured with a figure of eight Vicryl suture placed in the usual fashion. Pneumoperitoneum was then created with CO2 and tolerated well without any adverse changes in the patient's vital signs. Additional trocars were introduced under direct vision with an 11 mm trocar in the epigastrium and two  5 mm trocars in the right upper quadrant. All skin incisions were infiltrated with a local anesthetic agent before making the incision and placing the trocars.   The gallbladder was identified, the fundus grasped and retracted cephalad. Adhesions were lysed bluntly and with the electrocautery where indicated, taking care not to injure any adjacent organs or viscus. The infundibulum was grasped and retracted laterally, exposing the peritoneum overlying the triangle of Calot. This was then divided and exposed in a blunt fashion. The cystic duct was clearly identified and bluntly dissected circumferentially. The junctions of the gallbladder, cystic duct and common bile duct were clearly identified prior to the division of any linear structure.   An incision was made in the cystic duct and the cholangiogram catheter introduced. The catheter was secured using an endoclip. The study showed a small air bubble with  no stones and good visualization of the distal and  proximal biliary tree. The catheter was  then removed.   The cystic duct was then  ligated with surgical clips  on the patient side and  clipped on the gallbladder side and divided. The cystic artery was identified, dissected free, ligated with clips and divided as well. Posterior cystic artery clipped and divided.  The gallbladder was dissected from the liver bed in retrograde fashion with the electrocautery. The gallbladder was removed with Endocatch bag via the umbilical incision.. The liver bed was irrigated and inspected. Hemostasis was achieved with the electrocautery. Copious irrigation was utilized and was repeatedly aspirated until clear all particulate matter. Hemostasis was achieved with no signs  Of bleeding or bile leakage.  Pneumoperitoneum was completely reduced after viewing removal of the trocars under direct vision. The wound was thoroughly irrigated and the fascia was then closed with a figure of eight suture; the skin was then closed with 4 O monocryl  and a sterile dressing was applied.  Instrument, sponge, and needle counts were correct at closure and at the conclusion of the case.   Findings: IOC with small air bubble.  Otherwise normal.    Estimated Blood Loss: Minimal                 Total IV Fluids: 400 mL         Specimens: Gallbladder           Complications: None; patient tolerated the procedure well.         Disposition: PACU - hemodynamically stable.         Condition: stable

## 2014-01-03 NOTE — Anesthesia Preprocedure Evaluation (Signed)
Anesthesia Evaluation  Patient identified by MRN, date of birth, ID band Patient awake    Reviewed: Allergy & Precautions, H&P , NPO status , Patient's Chart, lab work & pertinent test results  History of Anesthesia Complications (+) PONV  Airway Mallampati: II TM Distance: >3 FB Neck ROM: Full    Dental   Pulmonary  breath sounds clear to auscultation        Cardiovascular Rhythm:Regular Rate:Normal     Neuro/Psych    GI/Hepatic PUD,   Endo/Other  Hypothyroidism   Renal/GU      Musculoskeletal   Abdominal (+) + obese,   Peds  Hematology   Anesthesia Other Findings   Reproductive/Obstetrics                           Anesthesia Physical Anesthesia Plan  ASA: II  Anesthesia Plan: General   Post-op Pain Management:    Induction: Intravenous  Airway Management Planned: Oral ETT  Additional Equipment:   Intra-op Plan:   Post-operative Plan: Extubation in OR  Informed Consent: I have reviewed the patients History and Physical, chart, labs and discussed the procedure including the risks, benefits and alternatives for the proposed anesthesia with the patient or authorized representative who has indicated his/her understanding and acceptance.     Plan Discussed with: CRNA and Surgeon  Anesthesia Plan Comments:         Anesthesia Quick Evaluation

## 2014-01-03 NOTE — Interval H&P Note (Signed)
History and Physical Interval Note:  01/03/2014 1:14 PM  Andrea Martinez  has presented today for surgery, with the diagnosis of biliary dyskinesia  The various methods of treatment have been discussed with the patient and family. After consideration of risks, benefits and other options for treatment, the patient has consented to  Procedure(s): LAPAROSCOPIC CHOLECYSTECTOMY WITH INTRAOPERATIVE CHOLANGIOGRAM (N/A) as a surgical intervention .  The patient's history has been reviewed, patient examined, no change in status, stable for surgery.  I have reviewed the patient's chart and labs.  Questions were answered to the patient's satisfaction.     Wilburta Milbourn A.

## 2014-01-03 NOTE — Discharge Instructions (Signed)
[facility] ______[facility] °LAPAROSCOPIC SURGERY: POST OP INSTRUCTIONS °Always review your discharge instruction sheet given to you by the facility where your surgery was performed. °IF YOU HAVE DISABILITY OR FAMILY LEAVE FORMS, YOU MUST BRING THEM TO THE OFFICE FOR PROCESSING.   °DO NOT GIVE THEM TO YOUR DOCTOR. ° °1. A prescription for pain medication may be given to you upon discharge.  Take your pain medication as prescribed, if needed.  If narcotic pain medicine is not needed, then you may take acetaminophen (Tylenol) or ibuprofen (Advil) as needed. °2. Take your usually prescribed medications unless otherwise directed. °3. If you need a refill on your pain medication, please contact your pharmacy.  They will contact our office to request authorization. Prescriptions will not be filled after 5pm or on week-ends. °4. You should follow a light diet the first few days after arrival home, such as soup and crackers, etc.  Be sure to include lots of fluids daily. °5. Most patients will experience some swelling and bruising in the area of the incisions.  Ice packs will help.  Swelling and bruising can take several days to resolve.  °6. It is common to experience some constipation if taking pain medication after surgery.  Increasing fluid intake and taking a stool softener (such as Colace) will usually help or prevent this problem from occurring.  A mild laxative (Milk of Magnesia or Miralax) should be taken according to package instructions if there are no bowel movements after 48 hours. °7. Unless discharge instructions indicate otherwise, you may remove your bandages 24-48 hours after surgery, and you may shower at that time.  You may have steri-strips (small skin tapes) in place directly over the incision.  These strips should be left on the skin for 7-10 days.  If your surgeon used skin glue on the incision, you may shower in 24 hours.  The glue will flake off over the next 2-3 weeks.  Any sutures or  staples will be removed at the office during your follow-up visit. °8. ACTIVITIES:  You may resume regular (light) daily activities beginning the next day--such as daily self-care, walking, climbing stairs--gradually increasing activities as tolerated.  You may have sexual intercourse when it is comfortable.  Refrain from any heavy lifting or straining until approved by your doctor. °a. You may drive when you are no longer taking prescription pain medication, you can comfortably wear a seatbelt, and you can safely maneuver your car and apply brakes. °b. RETURN TO WORK:  __________________________________________________________ °9. You should see your doctor in the office for a follow-up appointment approximately 2-3 weeks after your surgery.  Make sure that you call for this appointment within a day or two after you arrive home to insure a convenient appointment time. °10. OTHER INSTRUCTIONS: __________________________________________________________________________________________________________________________ __________________________________________________________________________________________________________________________ °WHEN TO CALL YOUR DOCTOR: °1. Fever over 101.0 °2. Inability to urinate °3. Continued bleeding from incision. °4. Increased pain, redness, or drainage from the incision. °5. Increasing abdominal pain ° °The clinic staff is available to answer your questions during regular business hours.  Please don’t hesitate to call and ask to speak to one of the nurses for clinical concerns.  If you have a medical emergency, go to the nearest emergency room or call 911.  A surgeon from [facility] is always on call at the hospital. °[address], [address], [place], [place]  [address] ? [address] [address], [place],    [address] °[phone] ? [phone] ? FAX [phone] °Web site:   [email or web address]    Post Anesthesia Home Care  Instructions  Activity: Get plenty of rest for the remainder of the day. A responsible adult should stay with you for 24 hours following the procedure.  For the next 24 hours, DO NOT: -Drive a car -Paediatric nurse -Drink alcoholic beverages -Take any medication unless instructed by your physician -Make any legal decisions or sign important papers.  Meals: Start with liquid foods such as gelatin or soup. Progress to regular foods as tolerated. Avoid greasy, spicy, heavy foods. If nausea and/or vomiting occur, drink only clear liquids until the nausea and/or vomiting subsides. Call your physician if vomiting continues.  Special Instructions/Symptoms: Your throat may feel dry or sore from the anesthesia or the breathing tube placed in your throat during surgery. If this causes discomfort, gargle with warm salt water. The discomfort should disappear within 24 hours. Call your surgeon if you experience:   1.  Fever over 101.0. 2.  Inability to urinate. 3.  Nausea and/or vomiting. 4.  Extreme swelling or bruising at the surgical site. 5.  Continued bleeding from the incision. 6.  Increased pain, redness or drainage from the incision. 7.  Problems related to your pain medication.

## 2014-01-03 NOTE — Transfer of Care (Signed)
Immediate Anesthesia Transfer of Care Note  Patient: Andrea Martinez  Procedure(s) Performed: Procedure(s): LAPAROSCOPIC CHOLECYSTECTOMY WITH INTRAOPERATIVE CHOLANGIOGRAM (N/A)  Patient Location: PACU  Anesthesia Type:General  Level of Consciousness: awake, alert , oriented and patient cooperative  Airway & Oxygen Therapy: Patient Spontanous Breathing and Patient connected to face mask oxygen  Post-op Assessment: Report given to PACU RN and Post -op Vital signs reviewed and stable  Post vital signs: Reviewed and stable  Complications: No apparent anesthesia complications

## 2014-01-03 NOTE — H&P (View-Only) (Signed)
Subjective:     Patient ID: Andrea Martinez, female   DOB: [birth year], 68 y.o.   MRN: [id number]  HPI Patient presents for second opinion for right upper quadrant pain in her abdomen. She has been seen by multiple physicians including Dr. Dalbert Batman here at [facility]. She's had a two-year history of intermittent right upper quadrant pain. This has been associated with nausea. It sometimes is worsened by eating fatty received these. Ultrasound was done which is normal. CT scan of abdomen done which was normal. HIDA study performed with normal ejection fraction. She states she had pain during the test after they gave her inch or drink. She's had multiple endoscopies which have been unrevealing. She continues to have right upper quadrant pain nausea and vomiting intermittently. She has seen 2 surgeons who have felt that surgery to remove her gallbladder would not be helpful. Have offered her referral to tertiary care which she has chosen against. She is asking for a third opinion about cholecystectomy to see if this would help her pain. She is quite miserable.  Review of Systems  Gastrointestinal: Positive for nausea, vomiting and abdominal pain.  Musculoskeletal: Negative.   Psychiatric/Behavioral: Positive for dysphoric mood.       Objective:   Physical Exam  Constitutional: She is oriented to person, place, and time. She appears well-developed and well-nourished.  HENT:  Head: Normocephalic and atraumatic.  Eyes: Pupils are equal, round, and reactive to light. No scleral icterus.  Neck: Normal range of motion.  Cardiovascular: Normal rate.   Pulmonary/Chest: Effort normal.  Abdominal: Soft. She exhibits no distension. There is no tenderness.  Neurological: She is alert and oriented to person, place, and time.  Skin: Skin is warm and dry.  Psychiatric: She has a normal mood and affect. Her behavior is normal. Judgment and thought content normal.       Assessment:     Right upper  quadrant abdominal pain question dyskinesia with pain during HIDA scan after ensure    Plan:     I long discussion with the patient after reviewing all of her testing and notes from all the physicians. I stated the likelihood that cholecystectomy would help her is about 5% at relieving her symptoms. Risks, benefits and alternatives discussed with her at great length. At this point in time, she would like to proceed with cholecystectomy since her extensive exhausting workup has been negative for source. Given that she's had pain with her HIDA study, there may be some benefit.The procedure has been discussed with the patient. Operative and non operative treatments have been discussed. Risks of surgery include bleeding, infection,  Common bile duct injury,  Injury to the stomach,liver, colon,small intestine, abdominal wall,  Diaphragm,  Major blood vessels,  And the need for an open procedure.  Other risks include worsening of medical problems, death,  DVT and pulmonary embolism, and cardiovascular events.   Medical options have also been discussed. The patient has been informed of long term expectations of surgery and non surgical options,  The patient agrees to proceed.

## 2014-01-04 ENCOUNTER — Encounter (HOSPITAL_BASED_OUTPATIENT_CLINIC_OR_DEPARTMENT_OTHER): Payer: Self-pay | Admitting: Surgery

## 2014-01-10 ENCOUNTER — Telehealth (INDEPENDENT_AMBULATORY_CARE_PROVIDER_SITE_OTHER): Payer: Self-pay | Admitting: General Surgery

## 2014-01-10 NOTE — Telephone Encounter (Signed)
Pt called to report she is having lower, mid abdominal pain.  She had lap chole and is POD # 7.  Pt reports loose, stringy stools with lots of mucous.  No real bowel movement since surgery.  Explained she is likely constipated from narcotics use and decreased activity, and is leaking some stool around a larger stool.  Discussed at length how to increase walking, pushing po fluids (especially prune, grape or apple juices,) stool softeners up to three a day, add Miralax BID, consider MOM and eat foods high in fiber.  Explained how getting her bowels moving daily will alleviate most of her abdominal discomfort.  She knows to expect a probable painful BM when she finally is able to move the hard stool.  Will call back prn.

## 2014-01-25 ENCOUNTER — Encounter (INDEPENDENT_AMBULATORY_CARE_PROVIDER_SITE_OTHER): Payer: Self-pay | Admitting: Surgery

## 2014-01-25 ENCOUNTER — Ambulatory Visit (INDEPENDENT_AMBULATORY_CARE_PROVIDER_SITE_OTHER): Payer: Medicare Other | Admitting: Surgery

## 2014-01-25 VITALS — BP 136/86 | HR 72 | Temp 97.8°F | Resp 14 | Ht 66.0 in | Wt 201.0 lb

## 2014-01-25 DIAGNOSIS — Z9889 Other specified postprocedural states: Secondary | ICD-10-CM

## 2014-01-25 NOTE — Patient Instructions (Signed)
Return as needed

## 2014-01-25 NOTE — Progress Notes (Signed)
she is here for a postop visit following laparoscopic cholecystectomy.  Diet is being tolerated, bowels are moving.  No problems with incisions.  PE:  ABD:  Soft, incisions clean/dry/intact and solid.  Assessment:  Doing well postop.  Plan:  Lowfat diet recommended.  Activities as tolerated.  Return visit prn. 

## 2014-05-14 ENCOUNTER — Ambulatory Visit (HOSPITAL_COMMUNITY)
Admission: RE | Admit: 2014-05-14 | Discharge: 2014-05-14 | Disposition: A | Discharge status: Home / Self Care | Payer: Medicare Other | Source: Ambulatory Visit | Attending: Pulmonary Disease | Admitting: Pulmonary Disease

## 2014-05-14 ENCOUNTER — Encounter: Payer: Self-pay | Admitting: Gastroenterology

## 2014-05-14 ENCOUNTER — Other Ambulatory Visit (HOSPITAL_COMMUNITY): Payer: Self-pay | Admitting: Pulmonary Disease

## 2014-05-14 DIAGNOSIS — R52 Pain, unspecified: Secondary | ICD-10-CM

## 2014-05-14 DIAGNOSIS — IMO0002 Reserved for concepts with insufficient information to code with codable children: Secondary | ICD-10-CM | POA: Insufficient documentation

## 2014-05-14 DIAGNOSIS — M1710 Unilateral primary osteoarthritis, unspecified knee: Secondary | ICD-10-CM | POA: Insufficient documentation

## 2014-10-22 ENCOUNTER — Other Ambulatory Visit (HOSPITAL_COMMUNITY): Payer: Self-pay | Admitting: Gynecology

## 2014-10-22 DIAGNOSIS — Z1231 Encounter for screening mammogram for malignant neoplasm of breast: Secondary | ICD-10-CM

## 2014-11-11 ENCOUNTER — Ambulatory Visit (HOSPITAL_COMMUNITY): Payer: Medicare Other

## 2014-11-13 ENCOUNTER — Ambulatory Visit (HOSPITAL_COMMUNITY)
Admission: RE | Admit: 2014-11-13 | Discharge: 2014-11-13 | Disposition: A | Discharge status: Home / Self Care | Payer: Medicare Other | Source: Ambulatory Visit | Attending: Gynecology | Admitting: Gynecology

## 2014-11-13 DIAGNOSIS — R928 Other abnormal and inconclusive findings on diagnostic imaging of breast: Secondary | ICD-10-CM | POA: Diagnosis not present

## 2014-11-13 DIAGNOSIS — Z1231 Encounter for screening mammogram for malignant neoplasm of breast: Secondary | ICD-10-CM | POA: Insufficient documentation

## 2014-11-14 ENCOUNTER — Other Ambulatory Visit: Payer: Self-pay | Admitting: Gynecology

## 2014-11-14 DIAGNOSIS — R928 Other abnormal and inconclusive findings on diagnostic imaging of breast: Secondary | ICD-10-CM

## 2014-11-28 ENCOUNTER — Other Ambulatory Visit: Payer: Self-pay | Admitting: Gynecology

## 2014-11-28 ENCOUNTER — Ambulatory Visit
Admission: RE | Admit: 2014-11-28 | Discharge: 2014-11-28 | Disposition: A | Discharge status: Home / Self Care | Payer: Medicare Other | Source: Ambulatory Visit | Attending: Gynecology | Admitting: Gynecology

## 2014-11-28 ENCOUNTER — Other Ambulatory Visit: Payer: Self-pay | Admitting: Orthopedic Surgery

## 2014-11-28 DIAGNOSIS — R928 Other abnormal and inconclusive findings on diagnostic imaging of breast: Secondary | ICD-10-CM

## 2014-12-16 DIAGNOSIS — R197 Diarrhea, unspecified: Secondary | ICD-10-CM | POA: Diagnosis not present

## 2015-01-14 DIAGNOSIS — R197 Diarrhea, unspecified: Secondary | ICD-10-CM | POA: Diagnosis not present

## 2015-02-03 DIAGNOSIS — R102 Pelvic and perineal pain: Secondary | ICD-10-CM | POA: Diagnosis not present

## 2015-02-11 DIAGNOSIS — R197 Diarrhea, unspecified: Secondary | ICD-10-CM | POA: Diagnosis not present

## 2015-02-17 DIAGNOSIS — E559 Vitamin D deficiency, unspecified: Secondary | ICD-10-CM | POA: Diagnosis not present

## 2015-02-17 DIAGNOSIS — E538 Deficiency of other specified B group vitamins: Secondary | ICD-10-CM | POA: Diagnosis not present

## 2015-02-17 DIAGNOSIS — R5383 Other fatigue: Secondary | ICD-10-CM | POA: Diagnosis not present

## 2015-02-17 DIAGNOSIS — E079 Disorder of thyroid, unspecified: Secondary | ICD-10-CM | POA: Diagnosis not present

## 2015-02-17 DIAGNOSIS — K580 Irritable bowel syndrome with diarrhea: Secondary | ICD-10-CM | POA: Diagnosis not present

## 2015-03-25 DIAGNOSIS — R5383 Other fatigue: Secondary | ICD-10-CM | POA: Diagnosis not present

## 2015-03-25 DIAGNOSIS — M170 Bilateral primary osteoarthritis of knee: Secondary | ICD-10-CM | POA: Diagnosis not present

## 2015-03-25 DIAGNOSIS — M25561 Pain in right knee: Secondary | ICD-10-CM | POA: Diagnosis not present

## 2015-03-25 DIAGNOSIS — R768 Other specified abnormal immunological findings in serum: Secondary | ICD-10-CM | POA: Diagnosis not present

## 2015-03-26 DIAGNOSIS — R768 Other specified abnormal immunological findings in serum: Secondary | ICD-10-CM | POA: Diagnosis not present

## 2015-04-14 DIAGNOSIS — M79604 Pain in right leg: Secondary | ICD-10-CM | POA: Diagnosis not present

## 2015-04-14 DIAGNOSIS — M533 Sacrococcygeal disorders, not elsewhere classified: Secondary | ICD-10-CM | POA: Diagnosis not present

## 2015-04-14 DIAGNOSIS — M1711 Unilateral primary osteoarthritis, right knee: Secondary | ICD-10-CM | POA: Diagnosis not present

## 2015-04-14 DIAGNOSIS — M545 Low back pain: Secondary | ICD-10-CM | POA: Diagnosis not present

## 2015-04-14 DIAGNOSIS — M79605 Pain in left leg: Secondary | ICD-10-CM | POA: Diagnosis not present

## 2015-04-28 ENCOUNTER — Other Ambulatory Visit: Payer: Self-pay | Admitting: Gynecology

## 2015-04-28 DIAGNOSIS — N6001 Solitary cyst of right breast: Secondary | ICD-10-CM

## 2015-04-30 ENCOUNTER — Ambulatory Visit (HOSPITAL_COMMUNITY)
Admission: RE | Admit: 2015-04-30 | Discharge: 2015-04-30 | Disposition: A | Discharge status: Home / Self Care | Payer: Medicare Other | Source: Ambulatory Visit | Attending: Pulmonary Disease | Admitting: Pulmonary Disease

## 2015-04-30 ENCOUNTER — Other Ambulatory Visit (HOSPITAL_COMMUNITY): Payer: Self-pay | Admitting: Pulmonary Disease

## 2015-04-30 DIAGNOSIS — M79672 Pain in left foot: Secondary | ICD-10-CM

## 2015-04-30 DIAGNOSIS — M25572 Pain in left ankle and joints of left foot: Secondary | ICD-10-CM

## 2015-04-30 DIAGNOSIS — R52 Pain, unspecified: Secondary | ICD-10-CM

## 2015-04-30 DIAGNOSIS — R609 Edema, unspecified: Secondary | ICD-10-CM

## 2015-04-30 DIAGNOSIS — M19072 Primary osteoarthritis, left ankle and foot: Secondary | ICD-10-CM | POA: Diagnosis not present

## 2015-04-30 DIAGNOSIS — M7989 Other specified soft tissue disorders: Secondary | ICD-10-CM | POA: Diagnosis not present

## 2015-04-30 DIAGNOSIS — R600 Localized edema: Secondary | ICD-10-CM | POA: Diagnosis not present

## 2015-04-30 DIAGNOSIS — M79605 Pain in left leg: Secondary | ICD-10-CM | POA: Insufficient documentation

## 2015-05-01 DIAGNOSIS — M25572 Pain in left ankle and joints of left foot: Secondary | ICD-10-CM | POA: Diagnosis not present

## 2015-05-01 DIAGNOSIS — M79672 Pain in left foot: Secondary | ICD-10-CM | POA: Diagnosis not present

## 2015-05-08 DIAGNOSIS — M791 Myalgia: Secondary | ICD-10-CM | POA: Diagnosis not present

## 2015-05-08 DIAGNOSIS — M2550 Pain in unspecified joint: Secondary | ICD-10-CM | POA: Diagnosis not present

## 2015-05-08 DIAGNOSIS — M79606 Pain in leg, unspecified: Secondary | ICD-10-CM | POA: Diagnosis not present

## 2015-05-08 DIAGNOSIS — R768 Other specified abnormal immunological findings in serum: Secondary | ICD-10-CM | POA: Diagnosis not present

## 2015-05-09 DIAGNOSIS — N390 Urinary tract infection, site not specified: Secondary | ICD-10-CM | POA: Diagnosis not present

## 2015-05-09 DIAGNOSIS — E079 Disorder of thyroid, unspecified: Secondary | ICD-10-CM | POA: Diagnosis not present

## 2015-05-09 DIAGNOSIS — K210 Gastro-esophageal reflux disease with esophagitis: Secondary | ICD-10-CM | POA: Diagnosis not present

## 2015-06-02 ENCOUNTER — Ambulatory Visit
Admission: RE | Admit: 2015-06-02 | Discharge: 2015-06-02 | Disposition: A | Discharge status: Home / Self Care | Payer: Medicare Other | Source: Ambulatory Visit | Attending: Gynecology | Admitting: Gynecology

## 2015-06-02 DIAGNOSIS — N6001 Solitary cyst of right breast: Secondary | ICD-10-CM

## 2015-07-07 DIAGNOSIS — R609 Edema, unspecified: Secondary | ICD-10-CM | POA: Diagnosis not present

## 2015-07-07 DIAGNOSIS — K580 Irritable bowel syndrome with diarrhea: Secondary | ICD-10-CM | POA: Diagnosis not present

## 2015-07-07 DIAGNOSIS — E079 Disorder of thyroid, unspecified: Secondary | ICD-10-CM | POA: Diagnosis not present

## 2015-07-14 ENCOUNTER — Ambulatory Visit (HOSPITAL_COMMUNITY)
Admission: RE | Admit: 2015-07-14 | Discharge: 2015-07-14 | Disposition: A | Discharge status: Home / Self Care | Payer: Medicare Other | Source: Ambulatory Visit | Attending: Pulmonary Disease | Admitting: Pulmonary Disease

## 2015-07-14 ENCOUNTER — Other Ambulatory Visit (HOSPITAL_COMMUNITY): Payer: Medicare Other

## 2015-07-14 ENCOUNTER — Other Ambulatory Visit (HOSPITAL_COMMUNITY): Payer: Self-pay | Admitting: Pulmonary Disease

## 2015-07-14 DIAGNOSIS — I071 Rheumatic tricuspid insufficiency: Secondary | ICD-10-CM | POA: Insufficient documentation

## 2015-07-14 DIAGNOSIS — I509 Heart failure, unspecified: Secondary | ICD-10-CM | POA: Diagnosis not present

## 2015-07-14 NOTE — Progress Notes (Signed)
  Echocardiogram 2D Echocardiogram has been performed.  Bobbye Charleston 07/14/2015, 2:16 PM

## 2015-09-15 DIAGNOSIS — R609 Edema, unspecified: Secondary | ICD-10-CM | POA: Diagnosis not present

## 2015-09-15 DIAGNOSIS — E079 Disorder of thyroid, unspecified: Secondary | ICD-10-CM | POA: Diagnosis not present

## 2015-09-15 DIAGNOSIS — K580 Irritable bowel syndrome with diarrhea: Secondary | ICD-10-CM | POA: Diagnosis not present

## 2015-09-15 DIAGNOSIS — M25562 Pain in left knee: Secondary | ICD-10-CM | POA: Diagnosis not present

## 2015-09-23 DIAGNOSIS — M79604 Pain in right leg: Secondary | ICD-10-CM | POA: Diagnosis not present

## 2015-09-23 DIAGNOSIS — M25562 Pain in left knee: Secondary | ICD-10-CM | POA: Diagnosis not present

## 2015-09-23 DIAGNOSIS — M170 Bilateral primary osteoarthritis of knee: Secondary | ICD-10-CM | POA: Diagnosis not present

## 2015-09-23 DIAGNOSIS — M1711 Unilateral primary osteoarthritis, right knee: Secondary | ICD-10-CM | POA: Diagnosis not present

## 2015-09-23 DIAGNOSIS — M25561 Pain in right knee: Secondary | ICD-10-CM | POA: Diagnosis not present

## 2015-09-23 DIAGNOSIS — M79605 Pain in left leg: Secondary | ICD-10-CM | POA: Diagnosis not present

## 2015-10-07 DIAGNOSIS — M25562 Pain in left knee: Secondary | ICD-10-CM | POA: Diagnosis not present

## 2015-10-07 DIAGNOSIS — M25561 Pain in right knee: Secondary | ICD-10-CM | POA: Diagnosis not present

## 2015-10-07 DIAGNOSIS — M1712 Unilateral primary osteoarthritis, left knee: Secondary | ICD-10-CM | POA: Diagnosis not present

## 2015-10-07 DIAGNOSIS — M79604 Pain in right leg: Secondary | ICD-10-CM | POA: Diagnosis not present

## 2015-10-07 DIAGNOSIS — M79605 Pain in left leg: Secondary | ICD-10-CM | POA: Diagnosis not present

## 2015-10-30 ENCOUNTER — Other Ambulatory Visit: Payer: Self-pay

## 2015-10-30 DIAGNOSIS — Z1231 Encounter for screening mammogram for malignant neoplasm of breast: Secondary | ICD-10-CM

## 2015-11-05 DIAGNOSIS — J449 Chronic obstructive pulmonary disease, unspecified: Secondary | ICD-10-CM | POA: Diagnosis not present

## 2015-11-05 DIAGNOSIS — E1165 Type 2 diabetes mellitus with hyperglycemia: Secondary | ICD-10-CM | POA: Diagnosis not present

## 2015-11-05 DIAGNOSIS — F419 Anxiety disorder, unspecified: Secondary | ICD-10-CM | POA: Diagnosis not present

## 2015-11-05 DIAGNOSIS — I10 Essential (primary) hypertension: Secondary | ICD-10-CM | POA: Diagnosis not present

## 2015-11-17 DIAGNOSIS — K580 Irritable bowel syndrome with diarrhea: Secondary | ICD-10-CM | POA: Diagnosis not present

## 2015-11-17 DIAGNOSIS — M1990 Unspecified osteoarthritis, unspecified site: Secondary | ICD-10-CM | POA: Diagnosis not present

## 2015-11-17 DIAGNOSIS — E079 Disorder of thyroid, unspecified: Secondary | ICD-10-CM | POA: Diagnosis not present

## 2015-11-17 DIAGNOSIS — R5383 Other fatigue: Secondary | ICD-10-CM | POA: Diagnosis not present

## 2015-11-19 ENCOUNTER — Other Ambulatory Visit (HOSPITAL_COMMUNITY): Payer: Self-pay | Admitting: Oncology

## 2015-11-19 DIAGNOSIS — R799 Abnormal finding of blood chemistry, unspecified: Secondary | ICD-10-CM | POA: Diagnosis not present

## 2015-11-19 DIAGNOSIS — D72829 Elevated white blood cell count, unspecified: Secondary | ICD-10-CM

## 2015-11-20 ENCOUNTER — Encounter (HOSPITAL_COMMUNITY): Payer: Medicare Other | Attending: Oncology

## 2015-11-20 ENCOUNTER — Encounter (HOSPITAL_BASED_OUTPATIENT_CLINIC_OR_DEPARTMENT_OTHER): Payer: Medicare Other | Admitting: Oncology

## 2015-11-20 ENCOUNTER — Encounter (HOSPITAL_COMMUNITY): Payer: Self-pay | Admitting: Oncology

## 2015-11-20 VITALS — BP 148/58 | HR 110 | Temp 98.6°F | Resp 18 | Ht 66.25 in | Wt 203.0 lb

## 2015-11-20 DIAGNOSIS — D72829 Elevated white blood cell count, unspecified: Secondary | ICD-10-CM | POA: Diagnosis not present

## 2015-11-20 DIAGNOSIS — C9210 Chronic myeloid leukemia, BCR/ABL-positive, not having achieved remission: Secondary | ICD-10-CM

## 2015-11-20 DIAGNOSIS — D649 Anemia, unspecified: Secondary | ICD-10-CM | POA: Diagnosis not present

## 2015-11-20 DIAGNOSIS — D72819 Decreased white blood cell count, unspecified: Secondary | ICD-10-CM | POA: Diagnosis not present

## 2015-11-20 HISTORY — DX: Chronic myeloid leukemia, BCR/ABL-positive, not having achieved remission: C92.10

## 2015-11-20 LAB — CBC WITH DIFFERENTIAL/PLATELET
BLASTS: 6 %
Band Neutrophils: 18 %
Basophils Absolute: 0 10*3/uL (ref 0.0–0.1)
Basophils Relative: 0 %
EOS ABS: 8.4 10*3/uL — AB (ref 0.0–0.7)
Eosinophils Relative: 5 %
HCT: 30.4 % — ABNORMAL LOW (ref 36.0–46.0)
Hemoglobin: 9.9 g/dL — ABNORMAL LOW (ref 12.0–15.0)
LYMPHS PCT: 3 %
Lymphs Abs: 5.1 10*3/uL — ABNORMAL HIGH (ref 0.7–4.0)
MCH: 32.8 pg (ref 26.0–34.0)
MCHC: 32.6 g/dL (ref 30.0–36.0)
MCV: 100.7 fL — ABNORMAL HIGH (ref 78.0–100.0)
MYELOCYTES: 13 %
Metamyelocytes Relative: 12 %
Monocytes Absolute: 1.7 10*3/uL — ABNORMAL HIGH (ref 0.1–1.0)
Monocytes Relative: 1 %
NEUTROS PCT: 34 %
NRBC: 1 /100{WBCs} — AB
Neutro Abs: 143.4 10*3/uL — ABNORMAL HIGH (ref 1.7–7.7)
PROMYELOCYTES ABS: 8 %
Platelets: 231 10*3/uL (ref 150–400)
RBC: 3.02 MIL/uL — AB (ref 3.87–5.11)
RDW: 18.4 % — ABNORMAL HIGH (ref 11.5–15.5)
WBC: 168.7 10*3/uL — AB (ref 4.0–10.5)

## 2015-11-20 LAB — COMPREHENSIVE METABOLIC PANEL
ALT: 35 U/L (ref 14–54)
ANION GAP: 10 (ref 5–15)
AST: 47 U/L — ABNORMAL HIGH (ref 15–41)
Albumin: 4.5 g/dL (ref 3.5–5.0)
Alkaline Phosphatase: 127 U/L — ABNORMAL HIGH (ref 38–126)
BUN: 27 mg/dL — ABNORMAL HIGH (ref 6–20)
CO2: 25 mmol/L (ref 22–32)
Calcium: 9.3 mg/dL (ref 8.9–10.3)
Chloride: 103 mmol/L (ref 101–111)
Creatinine, Ser: 1.12 mg/dL — ABNORMAL HIGH (ref 0.44–1.00)
GFR calc non Af Amer: 49 mL/min — ABNORMAL LOW (ref >60)
GFR, EST AFRICAN AMERICAN: 56 mL/min — AB (ref >60)
Glucose, Bld: 116 mg/dL — ABNORMAL HIGH (ref 65–99)
POTASSIUM: 4.1 mmol/L (ref 3.5–5.1)
SODIUM: 138 mmol/L (ref 135–145)
TOTAL PROTEIN: 8 g/dL (ref 6.5–8.1)
Total Bilirubin: 0.7 mg/dL (ref 0.3–1.2)

## 2015-11-20 LAB — LACTATE DEHYDROGENASE: LDH: 959 U/L — ABNORMAL HIGH (ref 98–192)

## 2015-11-20 NOTE — Progress Notes (Signed)
CRITICAL VALUE ALERT Critical value received:  WBC 170.4 Date of notification:  11/20/15 Time of notification: [id number] Critical value read back:  Yes.   Nurse who received alert:  Isidoro Donning RN MD notified (1st page):  Dr. Whitney Muse notified. She is to review smear.

## 2015-11-20 NOTE — Progress Notes (Signed)
[facility] Hematology/Oncology Consultation   Name: Andrea Martinez      MRN: [id number]   Date: 11/20/2015 Time:6:31 PM   REFERRING PHYSICIAN:  Sinda Du, MD  REASON FOR CONSULT:  Abnormal CBC, concerning for leukemia   DIAGNOSIS:  Leukocytosis with macrocytic anemia, grossly abnormal differential and smear review.  HISTORY OF PRESENT ILLNESS:   Andrea Martinez is a 69 year old white American female with a past medical history significant for IBS, B12 def on PO B12 replacement, seasonal allergies, Vit D deficiency, and vaginal atrophy who is emergently referred to [facility] by her primary care provider after performed lab work.  I received a call from Dr. Luan Pulling on Wednesday, 12/7 reporting this patient's case.  He notes that she has chronic fatigue and was seen in his office for persistence and progression of fatigue.  He notes that he updated her labs and found a leukocytosis ~ 170,000 with blasts, promyelocytes, myelocytes, and NRBCs.  He reported previously labs in the summer of 2016 when she was ill with an acute illness were unremarkable except for an expected WBC of around 15-17,000.  She was contacted emergently by our clinic as a work-in the same day, 12/7, but she has transportation difficulties.  The soonest we could get her in was today.  In the clinic, she looks relatively well without any acute findings on clinical examination.  She reports a decrease in energy over the past 2 months.  She is unable to climb stairs.  She notes some unexplained bruising on her lower legs and lower arms.  Ecchymoses are evaluated with are small, about the size of quarters and there are approximately 1-2 bruises per limb.  She also notes some sweats, but these were not drenching night sweats.  She denies any weight loss or other B symptoms.  She denies any abdominal pain and early satiety.  I personally reviewed and went over laboratory results with the patient.  The results are  noted within this dictation.  Labs are updated today and follow below in this dictation.   I have personally reviewed her smear.  She identifies many neutrophils (mature), metamyelocytes, myelocytes, blasts, large platelets.  Of note, her platelet count is WNL.  Her anemia is stable over the past 48 hours and is mildly macrocytic. 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   Given her lab results and smear findings, the patient case is discussed with Dr. Florene Glen, [facility].   PAST MEDICAL HISTORY:   Past Medical History  Diagnosis Date  . Gastric ulcer 2011    EGD, 5/11  . Hypothyroidism     not on meds, followed by Dr. Elyse Hsu  . PONV (postoperative nausea and vomiting)   . Lipoma     left upper arm  . UTI (lower urinary tract infection)     frequent  . Leukocytosis 11/20/2015    ALLERGIES: Allergies  Allergen Reactions  . Codeine Nausea Only    Headache  . Esomeprazole Magnesium Nausea Only  . Sulfonamide Derivatives Nausea Only    Headache      MEDICATIONS: I have reviewed the patient's current medications.    Current Outpatient Prescriptions on File Prior to Visit  Medication Sig Dispense Refill  . acetaminophen (TYLENOL) 500 MG tablet Take 500 mg by mouth every 6 (six) hours as needed.    . Cholecalciferol (VITAMIN D-3) 5000 UNITS TABS Take 5,000 Units by mouth daily.      Marland Kitchen  conjugated estrogens (PREMARIN) vaginal cream Place 1 Applicatorful vaginally 2 (two) times a week. 1/2 to 1g    . estradiol (CLIMARA - DOSED IN MG/24 HR) 0.0375 mg/24hr patch Place 0.0375 mg onto the skin once a week.     No current facility-administered medications on file prior to visit.     PAST SURGICAL HISTORY Past Surgical History  Procedure Laterality Date  . Esophagogastroduodenoscopy  02/2010    gastric ulcers  . Partial hysterectomy      vaginal at age 20 years of age  . Nose surgery    . Lipoma excision  08/02/2011    left shoulder  . Colonoscopy  01/2004    DR  [facility], few small tics  . Esophagogastroduodenoscopy  12/04/2012    [id number] gastritis (inflammation) was found in the gastric antrum; multiple biopsies The duodenal mucosa showed no abnormalities in the bulb and second portion of the duodenum  . Bravo ph study  12/04/2012    Procedure: BRAVO [place];  Surgeon: [name], MD;  [facility];  Service: Endoscopy;  Laterality: N/A;  . Toe debridement  1962    lt great toe  . Cholecystectomy N/A 01/03/2014    Procedure: LAPAROSCOPIC CHOLECYSTECTOMY WITH INTRAOPERATIVE CHOLANGIOGRAM;  Surgeon: Joyice Faster. Cornett, MD;  Location: [place];  Service: General;  Laterality: N/A;    FAMILY HISTORY: Family History  Problem Relation Age of Onset  . Lung cancer Father   . COPD Mother   . Colon cancer Neg Hx   . Anesthesia problems Neg Hx   . Hypotension Neg Hx   . Malignant hyperthermia Neg Hx   . Pseudochol deficiency Neg Hx     SOCIAL HISTORY:  reports that she has never smoked. She has never used smokeless tobacco. She reports that she does not drink alcohol or use illicit drugs.  PERFORMANCE STATUS: The patient's performance status is 1 - Symptomatic but completely ambulatory  PHYSICAL EXAM: Most Recent Vital Signs: Blood pressure 148/58, pulse 110, temperature 98.6 F (37 C), temperature source Oral, resp. rate 18, height 5' 6.25" (1.683 m), weight 203 lb (92.08 kg), SpO2 97 %. General appearance: alert, cooperative, appears stated age, fatigued, no distress, moderately obese, pale and significant amount of facial make-up, anxious, and accompanied by her husband Head: Normocephalic, without obvious abnormality, atraumatic Eyes: negative findings: lids and lashes normal, conjunctivae and sclerae normal, corneas clear and pupils equal, round, reactive to light and accomodation Throat: lips, mucosa, and tongue normal; teeth and gums normal Neck: no adenopathy and supple, symmetrical, trachea midline Lungs:  clear to auscultation bilaterally and normal percussion bilaterally Heart: regular rate and rhythm, S1, S2 normal, no murmur, click, rub or gallop Abdomen: normal findings: bowel sounds normal, liver span normal to percussion, no bruits heard, no masses palpable, no organomegaly, no renal abnormalities palpable, spleen non-palpable and symmetric and abnormal findings:  mild tenderness in the lower abdomen Extremities: Homans sign is negative, no sign of DVT and no edema, redness or tenderness in the calves or thighs Skin: Skin color, texture, turgor normal. No rashes or lesions Lymph nodes: Cervical, supraclavicular, and axillary nodes normal. Neurologic: Grossly normal  LABORATORY DATA:  CBC    Component Value Date/Time   WBC 168.7* 11/20/2015 1215   RBC 3.02* 11/20/2015 1215   HGB 9.9* 11/20/2015 1215   HCT 30.4* 11/20/2015 1215   PLT 231 11/20/2015 1215   MCV 100.7* 11/20/2015 1215   MCH 32.8 11/20/2015 1215   MCHC  32.6 11/20/2015 1215   RDW 18.4* 11/20/2015 1215   LYMPHSABS 5.1* 11/20/2015 1215   MONOABS 1.7* 11/20/2015 1215   EOSABS 8.4* 11/20/2015 1215   BASOSABS 0.0 11/20/2015 1215      Chemistry      Component Value Date/Time   NA 138 11/20/2015 1215   K 4.1 11/20/2015 1215   CL 103 11/20/2015 1215   CO2 25 11/20/2015 1215   BUN 27* 11/20/2015 1215   CREATININE 1.12* 11/20/2015 1215   CREATININE 0.87 07/14/2011 0938      Component Value Date/Time   CALCIUM 9.3 11/20/2015 1215   ALKPHOS 127* 11/20/2015 1215   AST 47* 11/20/2015 1215   ALT 35 11/20/2015 1215   BILITOT 0.7 11/20/2015 1215     Results for Andrea Martinez, Andrea Martinez (MRN [id number]) as of 11/20/2015 18:23  Ref. Range 11/20/2015 12:15  [facility] Latest Ref Range: 98-192 U/L 959 (H)      PATHOLOGY:  None   ASSESSMENT/PLAN:   Leukocytosis with mature neutrophilia, blasts Anemia  Leukocytosis with macrocytic anemia.  Grossly abnormal differential and smear.  Smear personally reviewed showing many mature  neutrophils, metamyelocytes, myelocytes, and blasts.  Platelets are very large.  Smear is given to the patient for hand delivery to Dr. Florene Glen, [facility], for her urgent outpatient appointment with him tomorrow. 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   Clinically, the patient appears to be stable.  No signs or symptoms of splenomegaly. She looks suprisingly well.   Interestingly, her platelets are maintained and WNL. I do not feel this is a case of acute leukemia. Her case has been discussed with Dr. Florene Glen and he will see her tomorrow at 8:30AM in his clinic at [facility].  Patient is advised to take some night clothes, a book, and a toothbrush in case direct hospital admission is necessary.  If she does have a chronic leukemia, we would be happy to be her local support and help manage her disease with [facility].  If she has a CML in blast phase, she will be best served at [facility].    We recruited old labs from her PCP 05/01/2015: WBC 13.0, HGB 13.0, PLT 274 (mild left shift with 1-5% metas, occ myelo, occ band) 08/14/2013: WBC 7.0, HGB 13.1, PLT 269, normal diff.  All questions were answered. The patient knows to call the clinic with any problems, questions or concerns. We can certainly see the patient much sooner if necessary.  More than 50% of the time spent with the patient was utilized for counseling and coordination of care.  This note is electronically signed by: Molli Hazard, MD  11/20/2015 6:31 PM

## 2015-11-20 NOTE — Patient Instructions (Addendum)
[place] at [facility] Discharge Instructions  RECOMMENDATIONS MADE BY THE CONSULTANT AND ANY TEST RESULTS WILL BE SENT TO YOUR REFERRING PHYSICIAN.  Exam and discussion by Robynn Pane, PA-C and Dr. Whitney Muse. White blood count is still elevated and we will make a referral to Dr. Jerrye Noble at [facility].  Dr. Florene Glen will see you tomorrow at 8:30am.  You need to arrive for your appointment at 8:00am.  Follow-up here to be determined.  Thank you for choosing [place] at [facility] to provide your oncology and hematology care.  To afford each patient quality time with our provider, please arrive at least 15 minutes before your scheduled appointment time.    You need to re-schedule your appointment should you arrive 10 or more minutes late.  We strive to give you quality time with our providers, and arriving late affects you and other patients whose appointments are after yours.  Also, if you no show three or more times for appointments you may be dismissed from the clinic at the providers discretion.     Again, thank you for choosing [facility].  Our hope is that these requests will decrease the amount of time that you wait before being seen by our physicians.       _____________________________________________________________  Should you have questions after your visit to [facility], please contact our office at [phone] between the hours of 8:30 a.m. and 4:30 p.m.  Voicemails left after 4:30 p.m. will not be returned until the following business day.  For prescription refill requests, have your pharmacy contact our office.

## 2015-11-20 NOTE — Assessment & Plan Note (Signed)
Leukocytosis with macrocytic anemia.  Grossly abnormal differential and smear.  Smear reviewed by Dr. Whitney Muse showing many neutrophils, metamyelocytes, myelocytes, and blasts.  Platelets are very large.  Smear is given to the patient for hand delivery to Dr. Florene Glen, [facility], for her urgent outpatient appointment with him tomorrow.  Clinically, the patient appears to be stable.  No signs or symptoms of splenomegaly.  Interestingly, her platelets are maintained and WNL.  With her findings, she could have a chronic leukemia that is evolving into an acute leukemia/blast form.  As a result, she requires a higher level of care at this time than can be provided at [facility].  Her case has been discussed with Dr. Florene Glen and he will see her tomorrow at 8:30AM in his clinic at [facility].  Patient is advised to take some night clothes, a book, and a toothbrush in case direct hospital admission is necessary.  If she does have a chronic leukemia, we would be happy to be her local support and help manage her disease with [facility].  If she has an acute leukemia, she will be best served at [facility].    We recruited old labs from her PCP 05/01/2015: WBC 13.0, HGB 13.0, PLT 274 (mild left shift with 1-5% metas, occ myelo, occ band) 08/14/2013: WBC 7.0, HGB 13.1, PLT 269, normal diff.

## 2015-11-21 DIAGNOSIS — M170 Bilateral primary osteoarthritis of knee: Secondary | ICD-10-CM | POA: Diagnosis not present

## 2015-11-21 DIAGNOSIS — K589 Irritable bowel syndrome without diarrhea: Secondary | ICD-10-CM | POA: Insufficient documentation

## 2015-11-21 DIAGNOSIS — N814 Uterovaginal prolapse, unspecified: Secondary | ICD-10-CM | POA: Diagnosis not present

## 2015-11-21 DIAGNOSIS — D649 Anemia, unspecified: Secondary | ICD-10-CM | POA: Insufficient documentation

## 2015-11-21 DIAGNOSIS — D729 Disorder of white blood cells, unspecified: Secondary | ICD-10-CM | POA: Diagnosis not present

## 2015-11-21 DIAGNOSIS — Z882 Allergy status to sulfonamides status: Secondary | ICD-10-CM | POA: Diagnosis not present

## 2015-11-21 DIAGNOSIS — Z885 Allergy status to narcotic agent status: Secondary | ICD-10-CM | POA: Diagnosis not present

## 2015-11-21 DIAGNOSIS — N8110 Cystocele, unspecified: Secondary | ICD-10-CM | POA: Insufficient documentation

## 2015-11-21 DIAGNOSIS — M1710 Unilateral primary osteoarthritis, unspecified knee: Secondary | ICD-10-CM | POA: Insufficient documentation

## 2015-11-21 DIAGNOSIS — D759 Disease of blood and blood-forming organs, unspecified: Secondary | ICD-10-CM | POA: Diagnosis not present

## 2015-11-21 DIAGNOSIS — M179 Osteoarthritis of knee, unspecified: Secondary | ICD-10-CM | POA: Insufficient documentation

## 2015-11-21 DIAGNOSIS — D72829 Elevated white blood cell count, unspecified: Secondary | ICD-10-CM | POA: Diagnosis not present

## 2015-11-21 LAB — PATHOLOGIST SMEAR REVIEW

## 2015-11-25 DIAGNOSIS — N816 Rectocele: Secondary | ICD-10-CM | POA: Diagnosis not present

## 2015-11-25 DIAGNOSIS — N952 Postmenopausal atrophic vaginitis: Secondary | ICD-10-CM | POA: Diagnosis not present

## 2015-11-25 DIAGNOSIS — N8112 Cystocele, lateral: Secondary | ICD-10-CM | POA: Diagnosis not present

## 2015-11-27 DIAGNOSIS — M25562 Pain in left knee: Secondary | ICD-10-CM | POA: Diagnosis not present

## 2015-11-27 DIAGNOSIS — M1711 Unilateral primary osteoarthritis, right knee: Secondary | ICD-10-CM | POA: Diagnosis not present

## 2015-11-27 DIAGNOSIS — M25561 Pain in right knee: Secondary | ICD-10-CM | POA: Diagnosis not present

## 2015-11-27 DIAGNOSIS — M1712 Unilateral primary osteoarthritis, left knee: Secondary | ICD-10-CM | POA: Diagnosis not present

## 2015-11-27 DIAGNOSIS — M170 Bilateral primary osteoarthritis of knee: Secondary | ICD-10-CM | POA: Diagnosis not present

## 2015-11-28 ENCOUNTER — Other Ambulatory Visit (HOSPITAL_COMMUNITY): Payer: Self-pay | Admitting: Oncology

## 2015-11-28 DIAGNOSIS — C9210 Chronic myeloid leukemia, BCR/ABL-positive, not having achieved remission: Secondary | ICD-10-CM

## 2015-12-02 ENCOUNTER — Encounter (HOSPITAL_BASED_OUTPATIENT_CLINIC_OR_DEPARTMENT_OTHER): Payer: Medicare Other

## 2015-12-02 ENCOUNTER — Telehealth (HOSPITAL_COMMUNITY): Payer: Self-pay

## 2015-12-02 DIAGNOSIS — C9210 Chronic myeloid leukemia, BCR/ABL-positive, not having achieved remission: Secondary | ICD-10-CM

## 2015-12-02 DIAGNOSIS — D72829 Elevated white blood cell count, unspecified: Secondary | ICD-10-CM | POA: Diagnosis not present

## 2015-12-02 LAB — MAGNESIUM: MAGNESIUM: 2 mg/dL (ref 1.7–2.4)

## 2015-12-02 LAB — COMPREHENSIVE METABOLIC PANEL
ALT: 28 U/L (ref 14–54)
AST: 36 U/L (ref 15–41)
Albumin: 4.2 g/dL (ref 3.5–5.0)
Alkaline Phosphatase: 130 U/L — ABNORMAL HIGH (ref 38–126)
Anion gap: 10 (ref 5–15)
BUN: 17 mg/dL (ref 6–20)
CO2: 25 mmol/L (ref 22–32)
CREATININE: 0.88 mg/dL (ref 0.44–1.00)
Calcium: 9.8 mg/dL (ref 8.9–10.3)
Chloride: 104 mmol/L (ref 101–111)
GFR calc Af Amer: >60 mL/min (ref >60)
GFR calc non Af Amer: >60 mL/min (ref >60)
Glucose, Bld: 126 mg/dL — ABNORMAL HIGH (ref 65–99)
Potassium: 3.7 mmol/L (ref 3.5–5.1)
Sodium: 139 mmol/L (ref 135–145)
Total Bilirubin: 0.7 mg/dL (ref 0.3–1.2)
Total Protein: 7.8 g/dL (ref 6.5–8.1)

## 2015-12-02 LAB — CBC WITH DIFFERENTIAL/PLATELET
BLASTS: 4 %
Band Neutrophils: 12 %
Basophils Absolute: 0 10*3/uL (ref 0.0–0.1)
Basophils Relative: 0 %
Eosinophils Absolute: 0 10*3/uL (ref 0.0–0.7)
Eosinophils Relative: 0 %
HCT: 28.1 % — ABNORMAL LOW (ref 36.0–46.0)
HEMOGLOBIN: 9 g/dL — AB (ref 12.0–15.0)
LYMPHS ABS: 0 10*3/uL — AB (ref 0.7–4.0)
Lymphocytes Relative: 0 %
MCH: 33.2 pg (ref 26.0–34.0)
MCHC: 32 g/dL (ref 30.0–36.0)
MCV: 103.7 fL — AB (ref 78.0–100.0)
MONOS PCT: 8 %
Metamyelocytes Relative: 7 %
Monocytes Absolute: 11.4 10*3/uL — ABNORMAL HIGH (ref 0.1–1.0)
Myelocytes: 13 %
NEUTROS PCT: 46 %
Neutro Abs: 124.9 10*3/uL — ABNORMAL HIGH (ref 1.7–7.7)
Platelets: 295 10*3/uL (ref 150–400)
Promyelocytes Absolute: 10 %
RBC: 2.71 MIL/uL — AB (ref 3.87–5.11)
RDW: 19.7 % — ABNORMAL HIGH (ref 11.5–15.5)
WBC: 141.9 10*3/uL — AB (ref 4.0–10.5)
nRBC: 0 /100 WBC

## 2015-12-02 LAB — PHOSPHORUS: Phosphorus: 4.3 mg/dL (ref 2.5–4.6)

## 2015-12-02 LAB — URIC ACID: URIC ACID, SERUM: 6 mg/dL (ref 2.3–6.6)

## 2015-12-02 NOTE — Telephone Encounter (Signed)
CRITICAL VALUE ALERT Critical value received:  WBC 141,900 Date of notification:  12/02/15  Time of notification: 1150  Critical value read back:  Yes.   Nurse who received alert:  Mickie Kay, RN  MD notified (1st page):  Dr. Whitney Muse 11:52

## 2015-12-02 NOTE — Progress Notes (Signed)
Andrea Martinez presented for labwork. Labs per MD order drawn via Peripheral Line 23 gauge needle inserted in left AC  Good blood return present. Procedure without incident.  Needle removed intact. Patient tolerated procedure well.

## 2015-12-04 DIAGNOSIS — M1711 Unilateral primary osteoarthritis, right knee: Secondary | ICD-10-CM | POA: Diagnosis not present

## 2015-12-04 DIAGNOSIS — M1712 Unilateral primary osteoarthritis, left knee: Secondary | ICD-10-CM | POA: Diagnosis not present

## 2015-12-04 DIAGNOSIS — M25561 Pain in right knee: Secondary | ICD-10-CM | POA: Diagnosis not present

## 2015-12-04 DIAGNOSIS — M25562 Pain in left knee: Secondary | ICD-10-CM | POA: Diagnosis not present

## 2015-12-04 DIAGNOSIS — M170 Bilateral primary osteoarthritis of knee: Secondary | ICD-10-CM | POA: Diagnosis not present

## 2015-12-05 ENCOUNTER — Ambulatory Visit
Admission: RE | Admit: 2015-12-05 | Discharge: 2015-12-05 | Disposition: A | Discharge status: Home / Self Care | Payer: Medicare Other | Source: Ambulatory Visit

## 2015-12-05 DIAGNOSIS — Z1231 Encounter for screening mammogram for malignant neoplasm of breast: Secondary | ICD-10-CM

## 2015-12-10 ENCOUNTER — Encounter (HOSPITAL_COMMUNITY): Payer: Self-pay | Admitting: Oncology

## 2015-12-10 ENCOUNTER — Telehealth (HOSPITAL_COMMUNITY): Payer: Self-pay | Admitting: Hematology & Oncology

## 2015-12-10 ENCOUNTER — Encounter (HOSPITAL_BASED_OUTPATIENT_CLINIC_OR_DEPARTMENT_OTHER): Payer: Medicare Other | Admitting: Oncology

## 2015-12-10 ENCOUNTER — Encounter (HOSPITAL_COMMUNITY): Payer: Medicare Other

## 2015-12-10 ENCOUNTER — Other Ambulatory Visit: Payer: Self-pay

## 2015-12-10 VITALS — BP 127/54 | HR 91 | Temp 98.3°F | Resp 16 | Wt 204.9 lb

## 2015-12-10 DIAGNOSIS — C9210 Chronic myeloid leukemia, BCR/ABL-positive, not having achieved remission: Secondary | ICD-10-CM | POA: Diagnosis not present

## 2015-12-10 DIAGNOSIS — D72829 Elevated white blood cell count, unspecified: Secondary | ICD-10-CM | POA: Diagnosis not present

## 2015-12-10 LAB — CBC WITH DIFFERENTIAL/PLATELET
BLASTS: 3 %
Band Neutrophils: 18 %
Basophils Absolute: 1.7 10*3/uL — ABNORMAL HIGH (ref 0.0–0.1)
Basophils Relative: 2 %
EOS ABS: 0 10*3/uL (ref 0.0–0.7)
Eosinophils Relative: 0 %
HCT: 27.1 % — ABNORMAL LOW (ref 36.0–46.0)
Hemoglobin: 8.5 g/dL — ABNORMAL LOW (ref 12.0–15.0)
Lymphocytes Relative: 6 %
Lymphs Abs: 5.2 10*3/uL — ABNORMAL HIGH (ref 0.7–4.0)
MCH: 33.3 pg (ref 26.0–34.0)
MCHC: 31.4 g/dL (ref 30.0–36.0)
MCV: 106.3 fL — AB (ref 78.0–100.0)
MYELOCYTES: 11 %
Metamyelocytes Relative: 8 %
Monocytes Absolute: 6.1 10*3/uL — ABNORMAL HIGH (ref 0.1–1.0)
Monocytes Relative: 7 %
NEUTROS PCT: 38 %
NRBC: 1 /100{WBCs} — AB
Neutro Abs: 71.4 10*3/uL — ABNORMAL HIGH (ref 1.7–7.7)
PLATELETS: 361 10*3/uL (ref 150–400)
PROMYELOCYTES ABS: 7 %
RBC: 2.55 MIL/uL — ABNORMAL LOW (ref 3.87–5.11)
RDW: 21.4 % — ABNORMAL HIGH (ref 11.5–15.5)
WBC: 87 10*3/uL — AB (ref 4.0–10.5)

## 2015-12-10 MED ORDER — DASATINIB 100 MG PO TABS: 100.0000 mg | ORAL_TABLET | Freq: Every day | ORAL | Status: DC

## 2015-12-10 NOTE — Progress Notes (Unsigned)
CRITICAL VALUE ALERT Critical value received: WBC 87.9 Date of notification:  12/28 Time of notification: [id number] Critical value read back:  Yes.   Nurse who received alert:  Lupita Raider RN Tom notified and he said ok.

## 2015-12-10 NOTE — Telephone Encounter (Signed)
FAXED SCRIPT TO [facility] °

## 2015-12-10 NOTE — Assessment & Plan Note (Addendum)
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 Florene Glen [facility]  Oncology history developed.  Labs today: CBC diff, CMET, Mg, and Phosphorus.  She has some upcoming appointments for treatment associated with some chronic medical issues.  She may keep all of these appointments and pursue them as directed.  She is scheduled for pessary for bladder and uterine prolapse and Synvisc injections for OA of knees.  All of these can be pursued as scheduled.  She is advised to avoid invasive procedures at this time until her blood counts improve with treatment (unless medically necessary).  I personally reviewed and went over radiographic studies with the patient.  The results are noted within this dictation.  Mammogram on 12/09/2015 was BIRADS 1.  Discussion regarding TKI therapy.  I have offered her Dasatinib 100 mg daily.  She is agreeable to this treatment option, but she is concerned about cost.  We will evaluate her cost moving forward.  She will need chemotherapy teaching and our Nurse navigator is aware.  Labs next week: CBC diff, CMET, LDH, Phosphorus, Magnesium, anemia panel  Weekly labs thereafter: CBC diff, CMET, LDH, Phosphorus, Magnesium  Expect Rx will be provided to the patient in 7 days or so.  She will then undergo chemotherapy teaching and start Dasatinib.  She will D/C Hydrea when she starts the TKI.  She can continue with Allopurinol.    Baseline EKG today.    I recommended Delsym for her non-productive cough.  She denies any URI symptoms otherwise.    Return 1 week after starting Dasatinib for tolerance and future planning of labs.

## 2015-12-10 NOTE — Patient Instructions (Signed)
[place] at [facility] Discharge Instructions  RECOMMENDATIONS MADE BY THE CONSULTANT AND ANY TEST RESULTS WILL BE SENT TO YOUR REFERRING PHYSICIAN.  Exam and discussion by Robynn Pane, PA-C Recommends Delsym for cough Will start you on Dasatinib and will get you scheduled for teaching when your medication has arrived Continue Hydrea and Allopurinol until chemo-teaching Call with any concerns.  Labs weekly Office visit in 2 weeks with Dr. Whitney Muse.  Thank you for choosing [place] at [facility] to provide your oncology and hematology care.  To afford each patient quality time with our provider, please arrive at least 15 minutes before your scheduled appointment time.    You need to re-schedule your appointment should you arrive 10 or more minutes late.  We strive to give you quality time with our providers, and arriving late affects you and other patients whose appointments are after yours.  Also, if you no show three or more times for appointments you may be dismissed from the clinic at the providers discretion.     Again, thank you for choosing [facility].  Our hope is that these requests will decrease the amount of time that you wait before being seen by our physicians.       _____________________________________________________________  Should you have questions after your visit to [facility] please contact our office at [phone] between the hours of 8:30 a.m. and 4:30 p.m.  Voicemails left after 4:30 p.m. will not be returned until the following business day.  For prescription refill requests, have your pharmacy contact our office.    Dasatinib oral tablet What is this medicine? DASATINIB (da SA ti nib) is a medicine that targets specific proteins in cancer cells and stops the cancer cells from growing. It is used to treat some kinds of leukemia. This medicine may be used for other purposes; ask  your health care provider or pharmacist if you have questions. What should I tell my health care provider before I take this medicine? They need to know if you have any of these conditions: -bleeding problems -heart disease -immune system problems -irregular heartbeat -liver disease -low levels of magnesium or potassium in the blood -an unusual or allergic reaction to dasatinib, lactose, other medicines, foods, dyes, or preservatives -pregnant or trying to get pregnant -breast-feeding How should I use this medicine? Take this medicine by mouth with a glass of water. Follow the directions on the prescription label. You can take it with or without food. Do not take with grapefruit juice. Do not cut, crush or chew this medicine. Take your medicine at regular intervals. Do not take your medicine more often than directed. Do not stop taking except on your doctor's advice. Avoid taking antacids containing aluminum, calcium, or magnesium within 2 hours of taking this medicine. You can take such antacids up to 2 hours before or 2 hours after this medicine. Avoid taking all other medicines that reduce stomach acid. Talk to your pediatrician regarding the use of this medicine in children. Special care may be needed. Overdosage: If you think you have taken too much of this medicine contact a poison control center or emergency room at once. NOTE: This medicine is only for you. Do not share this medicine with others. What if I miss a dose? If you miss a dose, take your next scheduled dose at its regular time. Do not take double or extra doses. Talk to your doctor if you  are not sure what to do. What may interact with this medicine? Do not take this medicine with any of the following medications: -certain medicines for fungal infections like fluconazole, itraconazole, ketoconazole, posaconazole, voriconazole -certain medicines for stomach problems like cimetidine, famotidine, ranitidine,  omeprazole -cisapride -dofetilide -dronedarone -ergot alkaloids like dihydroergotamine, ergonovine, ergotamine, methylergonovine -pimozide -St. John's Wort -thioridazine -ziprasidone This medicine may also interact with the following medications: -acetaminophen -alfentanil -antiviral medicines for HIV or AIDS -aspirin -carbamazepine -certain antibiotics like clarithromycin, erythromycin, rifampin, rifabutin, rifapentine, telithromycin, troleandomycin -certain medicines for cholesterol like atorvastatin -certain medicines that treat or prevent blood clots like warfarin -cyclosporine -dexamethasone -fentanyl -NSAIDS, medicines for pain and inflammation, like ibuprofen, ketoprofen, naproxen -other medicines that prolong the QT interval (cause an abnormal heart rhythm) -phenobarbital -phenytoin -sirolimus -tacrolimus This list may not describe all possible interactions. Give your health care provider a list of all the medicines, herbs, non-prescription drugs, or dietary supplements you use. Also tell them if you smoke, drink alcohol, or use illegal drugs. Some items may interact with your medicine. What should I watch for while using this medicine? Visit your doctor for regular check ups. Report any side effects. Continue your course of treatment unless your doctor tells you to stop. You will need blood work done while you are taking this medicine. You may need blood work done while you are taking this medicine. Do not become pregnant while taking this medicine or for 30 days after stopping it. Women should inform their doctor if they wish to become pregnant or think they might be pregnant. There is a potential for serious side effects to an unborn child. Talk to your health care professional or pharmacist for more information. Do not breast-feed an infant while taking this medicine and for 2 weeks after the last dose. What side effects may I notice from receiving this medicine? Side  effects that you should report to your doctor or health care professional as soon as possible: -allergic reactions like skin rash, itching or hives, swelling of the face, lips, or tongue -low blood counts - this medicine may decrease the number of white blood cells, red blood cells, and platelets. You may be at increased risk for infections and bleeding. -signs of infection - fever or chills, cough, sore throat, pain or difficulty passing urine -signs of decreased platelets or bleeding - bruising, pinpoint red spots on the skin, black, tarry stools, blood in the urine, nosebleeds -signs of decreased red blood cells - unusual weakness or tiredness, fainting spells, lightheadedness -breathing problems -dry cough -fast, irregular heartbeat -redness, blistering, peeling or loosening of the skin, including inside the mouth -swelling of the legs or ankles, or other parts of the body -sores or white patches in your mouth or throat -sudden weight gain Side effects that usually do not require medical attention (report to your prescriber or health care professional if they continue or are bothersome): -diarrhea -headache -nausea, vomiting -weak or tired This list may not describe all possible side effects. Call your doctor for medical advice about side effects. You may report side effects to FDA at 1-800-FDA-1088. Where should I keep my medicine? Keep out of the reach of children. Store at room temperature between 20 and 25 degrees C (68 and 77 degrees F). Throw away any unused medicine after the expiration date. NOTE: This sheet is a summary. It may not cover all possible information. If you have questions about this medicine, talk to your doctor, pharmacist, or health care provider.  2016, Elsevier/Gold Standard. (2015-02-05 21:58:39)

## 2015-12-10 NOTE — Progress Notes (Signed)
Andrea Bogus, MD [address] [place] [address]  Chronic myeloid leukemia, BCR/ABL-positive, not having achieved remission ([place]) - Plan: allopurinol (ZYLOPRIM) 300 MG tablet, hydroxyurea (HYDREA) 500 MG capsule, dasatinib (SPRYCEL) 100 MG tablet, CBC with Differential, Comprehensive metabolic panel, Lactate dehydrogenase, Vitamin B12, Folate, Iron and TIBC, Ferritin, Magnesium, Phosphorus, CBC with Differential, Comprehensive metabolic panel, Magnesium, Phosphorus, CBC with Differential, Comprehensive metabolic panel, Magnesium, Phosphorus, EKG 12-Lead  CURRENT THERAPY: Hydrea 1000 mg daily and Allopurinol 300 mg daily  INTERVAL HISTORY: Andrea Martinez 69 y.o. female returns for followup of Wilkes chromosome positive (by Avinger) CML with a significantly increased WBC and abnormal peripheral blood smear resulting in an outpatient referral to Dr. Florene Glen, [facility], for further work-up.    Chronic myelogenous leukemia (CML), BCR-ABL1-positive ([place])   11/21/2015 Initial Diagnosis Chronic myelogenous leukemia (CML), BCR-ABL1-positive ([place])   11/21/2015 Miscellaneous Seen by Dr. Florene Glen [facility]   11/21/2015 -  Chemotherapy Hydrea 1000 mg and Allopurinol 300 mg daily (due to high uric acid level)   11/21/2015 Tumor Marker BCR P210 Ratio of Fusion to Control, Blood 0.8941    P210 IS % Ratio, Blood 85.834%     I personally reviewed and went over laboratory results with the patient.  The results are noted within this dictation.  BCR/ABL at [facility] was positive and demonstrated a P210 ratio of fusion at 0.8941 ad P210 IS % of 85.834%.   I personally reviewed and went over radiographic studies with the patient.  The results are noted within this dictation.  Mammogram on 12/09/2015 is BIRADS 1.    Placida is very nervous today.  She asked me a question and in the midst of the answer to that question she lost interested and posed her next question.    She notes that Dr. Florene Glen  answered two of her most important question.  He informed her that CML is not hereditary and it is not contagious.  She notes that she was very relieved to hear that.    She notes a non-productive cough.  She has been using OTC robitussin without much benefit.  I discussed prescription strength anti-tussives with her, but she refused prescription strength medication.  I therefore recommended OTC Delsym, particularly at HS.  She notes fatigue and decreased stamina.  She reports that it is near her most recent baseline.  She denies any significant worsening of this.  Her progressive anemia is noted.  She may be showing signs/symptoms of this.  We will need to monitor closely.  I discussed the future treatment plan with the patient.  I reviewed TKI options, and given improved compliance with single day dosing, I will prescribed Dasatinib 100 mg daily.  I reviewed some of the more common side effects of this medication, but during this discussion, the patient was most concerned about hair loss.  Alopecia is not a typical side effect of Dasatinib.  She is agreeable to pursue this medication.  She is concerned about the cost, because her current regimen of Hydrea and Allopurinol is very affordable for the patient.  She is advised that we will work on cost for her, but Jannette Fogo is not the mainstay of treatment for CML.  She was started on Hydrea by Dr. Florene Glen while test results were pending to prevent continued climb of her WBC.  She is educated that she will need chemotherapy teaching prior to initiating Dasatinib therapy.   Past Medical History  Diagnosis Date  .  Gastric ulcer 2011    EGD, 5/11  . Hypothyroidism     not on meds, followed by Dr. Elyse Hsu  . PONV (postoperative nausea and vomiting)   . Lipoma     left upper arm  . UTI (lower urinary tract infection)     frequent  . Leukocytosis 11/20/2015  . Chronic myelogenous leukemia (CML), BCR-ABL1-positive ([place]) 11/20/2015    has UNSPECIFIED  DISORDER OF THYROID; PUD; NAUSEA; ABDOMINAL PAIN RIGHT UPPER QUADRANT; UTI'S, HX OF; Mass of arm; Lipoma; Lipoma of arm; Colon cancer screening; Rib pain; Dyspepsia; Biliary dyskinesia; and Chronic myelogenous leukemia (CML), BCR-ABL1-positive ([place]) on her problem list.     is allergic to codeine; esomeprazole magnesium; sulfonamide derivatives; and fluconazole.  Current Outpatient Prescriptions on File Prior to Visit  Medication Sig Dispense Refill  . acetaminophen (TYLENOL) 500 MG tablet Take 500 mg by mouth every 6 (six) hours as needed.    . cetirizine (ZYRTEC) 10 MG tablet Take 10 mg by mouth as needed for allergies.    . Cholecalciferol (VITAMIN D-3) 5000 UNITS TABS Take 5,000 Units by mouth daily.      . Cholestyramine Light (PREVALITE PO) Take 4 g by mouth as needed.    . conjugated estrogens (PREMARIN) vaginal cream Place 1 Applicatorful vaginally 2 (two) times a week. 1/2 to 1g    . Multiple Vitamins-Minerals (CENTRUM SILVER PO) Take 1 capsule by mouth daily.    . Probiotic Product (DIGESTIVE ADVANTAGE GUMMIES PO) Take 2 tablets by mouth daily.    Marland Kitchen triamcinolone (NASACORT ALLERGY 24HR) 55 MCG/ACT AERO nasal inhaler Place 2 sprays into the nose as needed.    . vitamin B-12 (CYANOCOBALAMIN) 1000 MCG tablet Take 1,000 mcg by mouth daily.     No current facility-administered medications on file prior to visit.    Past Surgical History  Procedure Laterality Date  . Esophagogastroduodenoscopy  02/2010    gastric ulcers  . Partial hysterectomy      vaginal at age 77 years of age  . Nose surgery    . Lipoma excision  08/02/2011    left shoulder  . Colonoscopy  01/2004    DR [facility], few small tics  . Esophagogastroduodenoscopy  12/04/2012    QMV:HQIONGE gastritis (inflammation) was found in the gastric antrum; multiple biopsies The duodenal mucosa showed no abnormalities in the bulb and second portion of the duodenum  . Bravo ph study  12/04/2012    Procedure: BRAVO [place];   Surgeon: [name], MD;  [facility];  Service: Endoscopy;  Laterality: N/A;  . Toe debridement  1962    lt great toe  . Cholecystectomy N/A 01/03/2014    Procedure: LAPAROSCOPIC CHOLECYSTECTOMY WITH INTRAOPERATIVE CHOLANGIOGRAM;  Surgeon: Joyice Faster. Cornett, MD;  Location: [place];  Service: General;  Laterality: N/A;    Denies any headaches, dizziness, double vision, fevers, chills, night sweats, nausea, vomiting, diarrhea, constipation, chest pain, heart palpitations, shortness of breath, blood in stool, black tarry stool, urinary pain, urinary burning, urinary frequency, hematuria.   PHYSICAL EXAMINATION  ECOG PERFORMANCE STATUS: 1 - Symptomatic but completely ambulatory  Filed Vitals:   12/10/15 1319  BP: 127/54  Pulse: 91  Temp: 98.3 F (36.8 C)  Resp: 16    GENERAL:alert, well nourished, well developed, comfortable, cooperative, obese, smiling and accompanied by her husband, nervous and anxious. SKIN: skin color, texture, turgor are normal, no rashes or significant lesions HEAD: Normocephalic, No masses, lesions, tenderness or abnormalities EYES: normal,  EOMI, Conjunctiva are pink and non-injected EARS: External ears normal OROPHARYNX:lips, buccal mucosa, and tongue normal and mucous membranes are moist  NECK: supple, trachea midline LYMPH:  not examined BREAST:not examined LUNGS: clear to auscultation and percussion HEART: regular rate & rhythm, no murmurs, no gallops, S1 normal and S2 normal ABDOMEN:abdomen soft, non-tender, obese and normal bowel sounds BACK: Back symmetric, no curvature. EXTREMITIES:less then 2 second capillary refill, no joint deformities, effusion, or inflammation, no skin discoloration, no cyanosis  NEURO: alert & oriented x 3 with fluent speech, no focal motor/sensory deficits   LABORATORY DATA: CBC    Component Value Date/Time   WBC 87.0* 12/10/2015 1301   RBC 2.55* 12/10/2015 1301   HGB 8.5* 12/10/2015  1301   HCT 27.1* 12/10/2015 1301   PLT 361 12/10/2015 1301   MCV 106.3* 12/10/2015 1301   MCH 33.3 12/10/2015 1301   MCHC 31.4 12/10/2015 1301   RDW 21.4* 12/10/2015 1301   LYMPHSABS 5.2* 12/10/2015 1301   MONOABS 6.1* 12/10/2015 1301   EOSABS 0.0 12/10/2015 1301   BASOSABS 1.7* 12/10/2015 1301      Chemistry      Component Value Date/Time   NA 139 12/02/2015 1051   K 3.7 12/02/2015 1051   CL 104 12/02/2015 1051   CO2 25 12/02/2015 1051   BUN 17 12/02/2015 1051   CREATININE 0.88 12/02/2015 1051   CREATININE 0.87 07/14/2011 0938      Component Value Date/Time   CALCIUM 9.8 12/02/2015 1051   ALKPHOS 130* 12/02/2015 1051   AST 36 12/02/2015 1051   ALT 28 12/02/2015 1051   BILITOT 0.7 12/02/2015 1051        PENDING LABS:   RADIOGRAPHIC STUDIES:  Mm Screening Breast Tomo Bilateral  12/09/2015  CLINICAL DATA:  Screening. EXAM: DIGITAL SCREENING BILATERAL MAMMOGRAM WITH 3D TOMO WITH CAD COMPARISON:  Previous exam(s). ACR Breast Density Category b: There are scattered areas of fibroglandular density. FINDINGS: There are no findings suspicious for malignancy. Images were processed with CAD. IMPRESSION: No mammographic evidence of malignancy. A result letter of this screening mammogram will be mailed directly to the patient. RECOMMENDATION: Screening mammogram in one year. (Code:SM-B-01Y) BI-RADS CATEGORY  1: Negative. Electronically Signed   By: Franki Cabot M.D.   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 Florene Glen [facility]).  Oncology history developed.  Labs today: CBC diff, CMET, Mg, and Phosphorus.  She has some upcoming appointments for treatment associated with some chronic medical issues.  She may keep all of these appointments and pursue them as directed.  She is  scheduled for pessary for bladder and uterine prolapse and Synvisc injections for OA of knees.  All of these can be pursued as scheduled.  She is advised to avoid invasive procedures at this time until her blood counts improve with treatment (unless medically necessary).  I personally reviewed and went over radiographic studies with the patient.  The results are noted within this dictation.  Mammogram on 12/09/2015 was BIRADS 1.  Discussion regarding TKI therapy.  I have offered her Dasatinib 100 mg daily.  She is agreeable to this treatment option, but she is concerned about cost.  We will evaluate her cost moving forward.  She will need chemotherapy teaching and our Nurse navigator is aware.  Labs next week: CBC diff, CMET,  LDH, Phosphorus, Magnesium, anemia panel  Weekly labs thereafter: CBC diff, CMET, LDH, Phosphorus, Magnesium  Expect Rx will be provided to the patient in 7 days or so.  She will then undergo chemotherapy teaching and start Dasatinib.  She will D/C Hydrea when she starts the TKI.  She can continue with Allopurinol.    Baseline EKG today.    I recommended Delsym for her non-productive cough.  She denies any URI symptoms otherwise.    Return 1 week after starting Dasatinib for tolerance and future planning of labs.    THERAPY PLAN:  Will start Dasatinib 100 mg daily in the near future with chemotherapy teaching.  All questions were answered. The patient knows to call the clinic with any problems, questions or concerns. We can certainly see the patient much sooner if necessary.  Patient and plan discussed with Dr. Ancil Linsey and she is in agreement with the aforementioned.   This note is electronically signed by: Doy Mince 12/10/2015 6:02 PM

## 2015-12-11 DIAGNOSIS — M1712 Unilateral primary osteoarthritis, left knee: Secondary | ICD-10-CM | POA: Diagnosis not present

## 2015-12-11 DIAGNOSIS — M1711 Unilateral primary osteoarthritis, right knee: Secondary | ICD-10-CM | POA: Diagnosis not present

## 2015-12-11 LAB — COMPREHENSIVE METABOLIC PANEL
ALBUMIN: 4.1 g/dL (ref 3.5–5.0)
ALK PHOS: 103 U/L (ref 38–126)
ALT: 29 U/L (ref 14–54)
AST: 37 U/L (ref 15–41)
Anion gap: 9 (ref 5–15)
BUN: 21 mg/dL — ABNORMAL HIGH (ref 6–20)
CALCIUM: 9.3 mg/dL (ref 8.9–10.3)
CO2: 25 mmol/L (ref 22–32)
Chloride: 106 mmol/L (ref 101–111)
Creatinine, Ser: 1.03 mg/dL — ABNORMAL HIGH (ref 0.44–1.00)
GFR calc Af Amer: >60 mL/min (ref >60)
GFR calc non Af Amer: 54 mL/min — ABNORMAL LOW (ref >60)
GLUCOSE: 111 mg/dL — AB (ref 65–99)
Potassium: 4.1 mmol/L (ref 3.5–5.1)
Sodium: 140 mmol/L (ref 135–145)
Total Bilirubin: 0.6 mg/dL (ref 0.3–1.2)
Total Protein: 7.3 g/dL (ref 6.5–8.1)

## 2015-12-11 LAB — PHOSPHORUS: Phosphorus: 4.1 mg/dL (ref 2.5–4.6)

## 2015-12-11 LAB — MAGNESIUM: Magnesium: 2.2 mg/dL (ref 1.7–2.4)

## 2015-12-16 ENCOUNTER — Telehealth (HOSPITAL_COMMUNITY): Payer: Self-pay | Admitting: Hematology & Oncology

## 2015-12-17 ENCOUNTER — Telehealth (HOSPITAL_COMMUNITY): Payer: Self-pay | Admitting: Emergency Medicine

## 2015-12-17 ENCOUNTER — Encounter (HOSPITAL_COMMUNITY): Payer: Medicare Other | Attending: Oncology

## 2015-12-17 ENCOUNTER — Telehealth (HOSPITAL_COMMUNITY): Payer: Self-pay | Admitting: Hematology & Oncology

## 2015-12-17 DIAGNOSIS — D72829 Elevated white blood cell count, unspecified: Secondary | ICD-10-CM | POA: Diagnosis not present

## 2015-12-17 DIAGNOSIS — C9210 Chronic myeloid leukemia, BCR/ABL-positive, not having achieved remission: Secondary | ICD-10-CM | POA: Insufficient documentation

## 2015-12-17 LAB — CBC WITH DIFFERENTIAL/PLATELET
BAND NEUTROPHILS: 0 %
BASOS ABS: 0 10*3/uL (ref 0.0–0.1)
BLASTS: 0 %
Basophils Relative: 0 %
Eosinophils Absolute: 0 10*3/uL (ref 0.0–0.7)
Eosinophils Relative: 0 %
HCT: 28.5 % — ABNORMAL LOW (ref 36.0–46.0)
Hemoglobin: 8.9 g/dL — ABNORMAL LOW (ref 12.0–15.0)
LYMPHS ABS: 0.9 10*3/uL (ref 0.7–4.0)
LYMPHS PCT: 1 %
MCH: 33.7 pg (ref 26.0–34.0)
MCHC: 31.2 g/dL (ref 30.0–36.0)
MCV: 108 fL — AB (ref 78.0–100.0)
MONOS PCT: 3 %
Metamyelocytes Relative: 4 %
Monocytes Absolute: 2.6 10*3/uL — ABNORMAL HIGH (ref 0.1–1.0)
Myelocytes: 7 %
NEUTROS ABS: 82.7 10*3/uL — AB (ref 1.7–7.7)
NEUTROS PCT: 82 %
NRBC: 0 /100{WBCs}
PLATELETS: 397 10*3/uL (ref 150–400)
PROMYELOCYTES ABS: 3 %
RBC: 2.64 MIL/uL — AB (ref 3.87–5.11)
RDW: 21.8 % — AB (ref 11.5–15.5)
WBC: 86.2 10*3/uL — AB (ref 4.0–10.5)

## 2015-12-17 LAB — COMPREHENSIVE METABOLIC PANEL
ALT: 23 U/L (ref 14–54)
ANION GAP: 9 (ref 5–15)
AST: 37 U/L (ref 15–41)
Albumin: 4.2 g/dL (ref 3.5–5.0)
Alkaline Phosphatase: 114 U/L (ref 38–126)
BUN: 15 mg/dL (ref 6–20)
CHLORIDE: 105 mmol/L (ref 101–111)
CO2: 27 mmol/L (ref 22–32)
CREATININE: 0.8 mg/dL (ref 0.44–1.00)
Calcium: 9.5 mg/dL (ref 8.9–10.3)
Glucose, Bld: 113 mg/dL — ABNORMAL HIGH (ref 65–99)
Potassium: 3.8 mmol/L (ref 3.5–5.1)
SODIUM: 141 mmol/L (ref 135–145)
Total Bilirubin: 0.6 mg/dL (ref 0.3–1.2)
Total Protein: 7.6 g/dL (ref 6.5–8.1)

## 2015-12-17 LAB — FOLATE: FOLATE: 43.8 ng/mL (ref >5.9)

## 2015-12-17 LAB — IRON AND TIBC
Iron: 49 ug/dL (ref 28–170)
Saturation Ratios: 12 % (ref 10.4–31.8)
TIBC: 400 ug/dL (ref 250–450)
UIBC: 351 ug/dL

## 2015-12-17 LAB — FERRITIN: Ferritin: 332 ng/mL — ABNORMAL HIGH (ref 11–307)

## 2015-12-17 LAB — MAGNESIUM: Magnesium: 2.2 mg/dL (ref 1.7–2.4)

## 2015-12-17 LAB — LACTATE DEHYDROGENASE: LDH: 528 U/L — AB (ref 98–192)

## 2015-12-17 LAB — VITAMIN B12: VITAMIN B 12: 5278 pg/mL — AB (ref 180–914)

## 2015-12-17 LAB — PHOSPHORUS: Phosphorus: 4 mg/dL (ref 2.5–4.6)

## 2015-12-17 NOTE — Telephone Encounter (Signed)
-----   Message from [facility], PA-C sent at 12/17/2015  2:06 PM EST ----- I have reviewed all lab results which are normal or stable. Please inform the patient.

## 2015-12-17 NOTE — Progress Notes (Signed)
CRITICAL VALUE ALERT Critical value received:  WBC 86.2 Date of notification:  12/17/2015 Time of notification: [id number] Critical value read back:  Yes.   Nurse who received alert:  Isidoro Donning RN

## 2015-12-17 NOTE — Telephone Encounter (Signed)
PC TO [facility] TO ADVISE THEM THAT I WOULD BE FAXING OVER A FREE  ONE TIME CO-PAY CARD FOR THE PT. ACTIVATED THE CARD ONLINE ALSO LEFT THE PT A VM EXPLAINING  WHAT I HAVE DONE AND TO CALL ME WITH ANY QUESTIONS.  [facility] [phone]

## 2015-12-17 NOTE — Telephone Encounter (Signed)
Notified pt that labs were normal, pt verbalized understanding

## 2015-12-18 NOTE — Progress Notes (Signed)
LABS DRAWN

## 2015-12-19 ENCOUNTER — Other Ambulatory Visit (HOSPITAL_COMMUNITY): Payer: Self-pay | Admitting: *Deleted

## 2015-12-19 DIAGNOSIS — C9210 Chronic myeloid leukemia, BCR/ABL-positive, not having achieved remission: Secondary | ICD-10-CM

## 2015-12-19 MED ORDER — IMATINIB MESYLATE 400 MG PO TABS: 400.0000 mg | ORAL_TABLET | Freq: Every day | ORAL | Status: DC

## 2015-12-24 ENCOUNTER — Other Ambulatory Visit (HOSPITAL_COMMUNITY): Payer: Self-pay | Admitting: *Deleted

## 2015-12-24 ENCOUNTER — Telehealth (HOSPITAL_COMMUNITY): Payer: Self-pay | Admitting: *Deleted

## 2015-12-24 NOTE — Telephone Encounter (Signed)
Pt notified that we are going to attempt to get her Sprycel covered at a much reduced cost and to bring in SS statements or dollar amounts to tomorrow's visit.

## 2015-12-25 ENCOUNTER — Encounter (HOSPITAL_COMMUNITY): Payer: Medicare Other

## 2015-12-25 ENCOUNTER — Encounter (HOSPITAL_COMMUNITY): Payer: Self-pay | Admitting: Hematology & Oncology

## 2015-12-25 ENCOUNTER — Encounter (HOSPITAL_BASED_OUTPATIENT_CLINIC_OR_DEPARTMENT_OTHER): Payer: Medicare Other | Admitting: Hematology & Oncology

## 2015-12-25 VITALS — BP 139/56 | HR 86 | Temp 97.6°F | Resp 18 | Wt 203.0 lb

## 2015-12-25 DIAGNOSIS — C9210 Chronic myeloid leukemia, BCR/ABL-positive, not having achieved remission: Secondary | ICD-10-CM

## 2015-12-25 DIAGNOSIS — F418 Other specified anxiety disorders: Secondary | ICD-10-CM | POA: Diagnosis not present

## 2015-12-25 DIAGNOSIS — Z598 Other problems related to housing and economic circumstances: Secondary | ICD-10-CM

## 2015-12-25 DIAGNOSIS — Z599 Problem related to housing and economic circumstances, unspecified: Secondary | ICD-10-CM

## 2015-12-25 DIAGNOSIS — D649 Anemia, unspecified: Secondary | ICD-10-CM | POA: Diagnosis not present

## 2015-12-25 DIAGNOSIS — D72829 Elevated white blood cell count, unspecified: Secondary | ICD-10-CM | POA: Diagnosis not present

## 2015-12-25 LAB — COMPREHENSIVE METABOLIC PANEL
ALBUMIN: 4.4 g/dL (ref 3.5–5.0)
ALK PHOS: 109 U/L (ref 38–126)
ALT: 20 U/L (ref 14–54)
ANION GAP: 9 (ref 5–15)
AST: 35 U/L (ref 15–41)
BILIRUBIN TOTAL: 0.6 mg/dL (ref 0.3–1.2)
BUN: 18 mg/dL (ref 6–20)
CALCIUM: 9.6 mg/dL (ref 8.9–10.3)
CO2: 27 mmol/L (ref 22–32)
Chloride: 105 mmol/L (ref 101–111)
Creatinine, Ser: 0.93 mg/dL (ref 0.44–1.00)
GFR calc Af Amer: >60 mL/min (ref >60)
GFR calc non Af Amer: >60 mL/min (ref >60)
GLUCOSE: 118 mg/dL — AB (ref 65–99)
Potassium: 4.2 mmol/L (ref 3.5–5.1)
Sodium: 141 mmol/L (ref 135–145)
TOTAL PROTEIN: 7.6 g/dL (ref 6.5–8.1)

## 2015-12-25 LAB — CBC WITH DIFFERENTIAL/PLATELET
BLASTS: 1 %
Band Neutrophils: 31 %
Basophils Absolute: 0 10*3/uL (ref 0.0–0.1)
Basophils Relative: 0 %
Eosinophils Absolute: 0 10*3/uL (ref 0.0–0.7)
Eosinophils Relative: 0 %
HEMATOCRIT: 29.8 % — AB (ref 36.0–46.0)
HEMOGLOBIN: 9.5 g/dL — AB (ref 12.0–15.0)
Lymphocytes Relative: 5 %
Lymphs Abs: 5.1 10*3/uL — ABNORMAL HIGH (ref 0.7–4.0)
MCH: 34.5 pg — AB (ref 26.0–34.0)
MCHC: 31.9 g/dL (ref 30.0–36.0)
MCV: 108.4 fL — ABNORMAL HIGH (ref 78.0–100.0)
METAMYELOCYTES PCT: 19 %
MONO ABS: 2 10*3/uL — AB (ref 0.1–1.0)
MYELOCYTES: 8 %
Monocytes Relative: 2 %
Neutro Abs: 93.7 10*3/uL — ABNORMAL HIGH (ref 1.7–7.7)
Neutrophils Relative %: 32 %
PROMYELOCYTES ABS: 2 %
Platelets: 371 10*3/uL (ref 150–400)
RBC: 2.75 MIL/uL — AB (ref 3.87–5.11)
RDW: 21 % — ABNORMAL HIGH (ref 11.5–15.5)
WBC: 101.9 10*3/uL — AB (ref 4.0–10.5)
nRBC: 0 /100 WBC

## 2015-12-25 LAB — PHOSPHORUS: Phosphorus: 4.9 mg/dL — ABNORMAL HIGH (ref 2.5–4.6)

## 2015-12-25 LAB — MAGNESIUM: Magnesium: 2.1 mg/dL (ref 1.7–2.4)

## 2015-12-25 NOTE — Progress Notes (Signed)
CRITICAL VALUE ALERT Critical value received:  WBC 101.9 Date of notification:  12/25/15 Time of notification: 0913 Critical value read back:  Yes.   Nurse who received alert:  Isidoro Donning RN

## 2015-12-25 NOTE — Patient Instructions (Addendum)
[place] [facility] Discharge Instructions  RECOMMENDATIONS MADE BY THE CONSULTANT AND ANY TEST RESULTS WILL BE SENT TO YOUR REFERRING PHYSICIAN.  Exam and discussion today with Dr. Whitney Muse. 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 Return in 2 weeks for lab work and office visit. Please call the clinic with any questions or concerns.    Thank you for choosing [place] at [facility] to provide your oncology and hematology care.  To afford each patient quality time with our provider, please arrive at least 15 minutes before your scheduled appointment time.    You need to re-schedule your appointment should you arrive 10 or more minutes late.  We strive to give you quality time with our providers, and arriving late affects you and other patients whose appointments are after yours.  Also, if you no show three or more times for appointments you may be dismissed from the clinic at the providers discretion.     Again, thank you for choosing [facility].  Our hope is that these requests will decrease the amount of time that you wait before being seen by our physicians.       _____________________________________________________________  Should you have questions after your visit to [facility], please contact our office at [phone] between the hours of 8:30 a.m. and 4:30 p.m.  Voicemails left after 4:30 p.m. will not be returned until the following business day.  For prescription refill requests, have your pharmacy contact our office.     Nilotinib Oral Capsule What is this medicine? NILOTINIB (nil OT i nib) is a medicine that targets proteins in cancer cells and stops the cells from growing. It is used to treat chronic myelogenous leukemia (CML). This medicine may be used for other purposes; ask your health care provider or pharmacist if you have questions. What should I tell my health care  provider before I take this medicine? They need to know if you have any of these conditions: -heart disease -history of irregular heartbeat -history of pancreatitis -liver disease -low magnesium or potassium levels in the body -QT prolongation -total gastrectomy -an unusual or allergic reaction to nilotinib, lactose, gelatin, other medicines, foods, dyes, or preservatives -pregnant or trying to get pregnant -breast-feeding How should I use this medicine? Take this medicine by mouth with a glass of water. Follow the directions on the prescription label. Take this medicine on an empty stomach, at least 1 hour before or 2 hours after food. Do not take with food or with grapefruit juice. Take H2-blockers at least 10 hours before or 2 hours after this medicine. Avoid taking antacids within 2 hours of taking this medicine. Do not cut, crush, or chew this medicine. If you cannot swallow the capsules whole, you may open the capsule and sprinkle the contents of each capsule in 1 teaspoon of applesauce. Immediately swallow the mixture. Do not store for future use. Take your medicine at regular intervals. Do not take it more often than directed. Do not stop taking except on your doctor's advice. A special MedGuide will be given to you by the pharmacist with each prescription and refill. Be sure to read this information carefully each time. Talk to your pediatrician regarding the use of this medicine in children. Special care may be needed. Overdosage: If you think you have taken too much of this medicine contact a poison control center or emergency room at once.  NOTE: This medicine is only for you. Do not share this medicine with others. What if I miss a dose? If you miss a dose, do not make up the missing dose. Take your next dose as scheduled. Do not take double or extra doses. What may interact with this medicine? Do not take this medicine with any of the following  medications: -amoxapine -astemizole -bupivacaine -cisapride -clozapine -cyclobenzaprine -disopyramide -droperidol -flecainide -grapefruit or grapefruit juice -halofantrine -haloperidol -maprotiline -methadone -perphenazine -pimozide -quinidine -ranolazine -risperidone -sunitinib -tacrolimus -terfenadine -thioridazine -ziprasidone This medicine may also interact with the following medications: -antacids -antiviral medicines for HIV or AIDS -certain antibiotics like clarithromycin, erythromycin, telithromycin, troleandomycin -dexamethasone -medicines for blood pressure, heart disease, irregular heart beat -medicines for depression, anxiety, or psychotic disturbances -medicines for fungal infections like ketoconazole, itraconazole, voriconazole, fluconazole -medicines for seizures like carbamazepine, phenobarbital, phenytoin -medicines for stomach problems like cimetidine, famotidine, omeprazole, lansoprazole -medicines for sleep -mifepristone -propoxyphene -rifabutin -rifampin -rifapentine -St. John's Wort -tamoxifen -warfarin -zafirlukast This list may not describe all possible interactions. Give your health care provider a list of all the medicines, herbs, non-prescription drugs, or dietary supplements you use. Also tell them if you smoke, drink alcohol, or use illegal drugs. Some items may interact with your medicine. What should I watch for while using this medicine? Visit your doctor for checks on your progress. You will need to have regular blood tests while on this medicine. Report any new symptoms promptly. Call your doctor or health care professional for advice if you get a fever, chills or sore throat, or other symptoms of a cold or flu. Do not treat yourself. This drug decreases your body's ability to fight infections. Try to avoid being around people who are sick. This medicine may increase your risk to bruise or bleed. Call your doctor or health care  professional if you notice any unusual bleeding. Be careful brushing and flossing your teeth or using a toothpick because you may get an infection or bleed more easily. If you have any dental work done, tell your dentist you are receiving this medicine. Avoid taking products that contain aspirin, acetaminophen, ibuprofen, naproxen, or ketoprofen unless instructed by your doctor. These medicines may hide a fever. Do not become pregnant while taking this medicine. Women should inform their doctor if they wish to become pregnant or think they might be pregnant. There is a potential for serious side effects to an unborn child. Talk to your health care professional or pharmacist for more information. Do not breast-feed an infant while taking this medicine. This medicine may affect blood sugar levels. If you have diabetes, check with your doctor or health care professional before you change your diet or the dose of your diabetic medicine. This drug may make you feel generally unwell. This is not uncommon, as chemotherapy can affect healthy cells as well as cancer cells. Report any side effects. Continue your course of treatment even though you feel ill unless your doctor tells you to stop. What side effects may I notice from receiving this medicine? Side effects that you should report to your doctor or health care professional as soon as possible: -allergic reactions like skin rash, itching or hives, swelling of the face, lips, or tongue -breathing problems -chest pain or palpitations -confusion, trouble speaking or understanding -dizziness or fainting -fast, irregular heartbeat -fever or chills, sore throat -increased hunger or thirst -increased urination -light-colored stools -pain, swelling, warmth in the leg -signs and symptoms of bleeding such as bloody or black, tarry  stools; red or dark-brown urine; spitting up blood or brown material that looks like coffee grounds; red spots on the skin; unusual  bruising or bleeding from the eye, gums, or nose -sudden numbness or weakness of the face, arm or leg -swelling of the ankles, feet, hands -trouble walking, dizziness, loss of balance or coordination -unusually weak or tired -weight gain -yellowing of the eyes or skin Side effects that usually do not require medical attention (report to your doctor or health care professional if they continue or are bothersome): -constipation -diarrhea -headache -loss of appetite -nausea, vomiting -muscle aches -stomach pain -trouble sleeping This list may not describe all possible side effects. Call your doctor for medical advice about side effects. You may report side effects to FDA at 1-800-FDA-1088. Where should I keep my medicine? Keep out of the reach of children. Store at room temperature between 15 and 30 degrees C (59 and 86 degrees F). Throw away any unused medicine after the expiration date. NOTE: This sheet is a summary. It may not cover all possible information. If you have questions about this medicine, talk to your doctor, pharmacist, or health care provider.    2016, Elsevier/Gold Standard. (2015-02-05 22:09:40)

## 2015-12-25 NOTE — Progress Notes (Signed)
[facility] Hematology/Oncology Consultation   Name: Andrea Martinez      MRN: [id number]   Date: 12/25/2015 Time:2:02 PM   REFERRING PHYSICIAN:  Sinda Du, MD   DIAGNOSIS:  CML    HISTORY OF PRESENT ILLNESS:   Andrea Martinez is a 70 year old white American female here for additional f/u of CML  Andrea Martinez is accompanied by her husband.   Patient has been suffering from anxiety, very much related to the financial stress of paying for treatment. Stating "I don't need money problems" and tearing up. She is agreeable to trying an anti-depressant but would like to wait a couple of weeks.  She spoke with her insurance agent yesterday to figure out how she would be able to afford treatment, stating that she has Part D with a 29% co-pay up to max. She sounded agitated saying, "I cannot afford all of this" furthermore, "I don't understand where all these totals keep changing". She is overwhelmed by other bills from "being here" and cannot foresee being able to afford treatment.  Reports that her latest mammogram came back with good results. She notes that is the only good news she has heard recently.  She continues on hydrea. She has noticed some mild hair thinning. She is tired. She denies nausea or vomiting. She notes her IBS has been worse recently but thinks it is secondary to stress.  From [facility]: (Consultation with Dr. Florene Glen)  Her blood counts are largely the same today as earlier this week. Today, her WBCS are 162.9k with an ANC of 81.6k and platelets are 258k. Of note, uric acid is elevated to 8.6 and LDH is elevated to 985. Creatinine is within normal limits at 0.86. On exam, I had difficulty feeling her spleen (there is mild fullness very laterally). We discussed the mechanism of leukemia and I informed her that with a white count this high (20x normal limit), we become concerned about acute leukemia. Fortunately, we did not notice an increase in blast cells  today, which indicates that she likely has chronic myeloid leukemia as opposed to acute leukemia. I explained that we are now able to manage CML with high success rates on an outpatient basis with oral medication. I furthermore discussed that we will perform additional testing for chromosomal abnormalities (including Philadelphia chromosome) to confirm a diagnosis of CML. PCR testing is also pending. I recommend starting Hydrea to prevent her white count from increasing further and starting Allopurinol to help protect her kidneys.  I explained that if CML is confirmed, we will plan to start her on an oral TKI indefinitely. If testing does not confirm CML, the next step would be a bone marrow biopsy. However, at this point we feel that she likely has CML and will not need a bone marrow biopsy.   PAST MEDICAL HISTORY:   Past Medical History  Diagnosis Date  . Gastric ulcer 2011    EGD, 5/11  . Hypothyroidism     not on meds, followed by Dr. Elyse Hsu  . PONV (postoperative nausea and vomiting)   . Lipoma     left upper arm  . UTI (lower urinary tract infection)     frequent  . Leukocytosis 11/20/2015  . 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 Codeine Nausea Only    Headache  . Esomeprazole Magnesium Nausea Only  . Sulfonamide Derivatives Nausea Only  Headache  . Fluconazole Rash    Redness and blistering on left thigh      MEDICATIONS: I have reviewed the patient's current medications.    Current Outpatient Prescriptions on File Prior to Visit  Medication Sig Dispense Refill  . acetaminophen (TYLENOL) 500 MG tablet Take 500 mg by mouth every 6 (six) hours as needed.    Marland Kitchen allopurinol (ZYLOPRIM) 300 MG tablet Take 1 tablet by mouth daily.  2  . cetirizine (ZYRTEC) 10 MG tablet Take 10 mg by mouth as needed for allergies.    . Cholecalciferol (VITAMIN D-3) 5000 UNITS TABS Take 5,000 Units by mouth daily.       . Cholestyramine Light (PREVALITE PO) Take 4 g by mouth as needed.    . conjugated estrogens (PREMARIN) vaginal cream Place 1 Applicatorful vaginally 2 (two) times a week. 1/2 to 1g    . hydroxyurea (HYDREA) 500 MG capsule Take 2 capsules by mouth daily.  1  . Multiple Vitamins-Minerals (CENTRUM SILVER PO) Take 1 capsule by mouth daily.    . Probiotic Product (DIGESTIVE ADVANTAGE GUMMIES PO) Take 2 tablets by mouth daily.    Marland Kitchen triamcinolone (NASACORT ALLERGY 24HR) 55 MCG/ACT AERO nasal inhaler Place 2 sprays into the nose as needed.    . vitamin B-12 (CYANOCOBALAMIN) 1000 MCG tablet Take 5,000 mcg by mouth daily.     . dasatinib (SPRYCEL) 100 MG tablet Take 1 tablet (100 mg total) by mouth daily. (Patient not taking: Reported on 12/25/2015) 30 tablet 2  . imatinib (GLEEVEC) 400 MG tablet Take 1 tablet (400 mg total) by mouth daily. Take with meals and large glass of water.Caution:Chemotherapy. (Patient not taking: Reported on 12/25/2015) 30 tablet 3   No current facility-administered medications on file prior to visit.     PAST SURGICAL HISTORY Past Surgical History  Procedure Laterality Date  . Esophagogastroduodenoscopy  02/2010    gastric ulcers  . Partial hysterectomy      vaginal at age 79 years of age  . Nose surgery    . Lipoma excision  08/02/2011    left shoulder  . Colonoscopy  01/2004    [facility], few small tics  . Esophagogastroduodenoscopy  12/04/2012    AFB:XUXYBFX gastritis (inflammation) was found in the gastric antrum; multiple biopsies The duodenal mucosa showed no abnormalities in the bulb and second portion of the duodenum  . Bravo ph study  12/04/2012    Procedure: BRAVO [place];  Surgeon: [name], MD;  Location: [facility];  Service: Endoscopy;  Laterality: N/A;  . Toe debridement  1962    lt great toe  . Cholecystectomy N/A 01/03/2014    Procedure: LAPAROSCOPIC CHOLECYSTECTOMY WITH INTRAOPERATIVE CHOLANGIOGRAM;  Surgeon: Joyice Faster. Cornett, MD;   Location: [place];  Service: General;  Laterality: N/A;    FAMILY HISTORY: Family History  Problem Relation Age of Onset  . Lung cancer Father   . COPD Mother   . Colon cancer Neg Hx   . Anesthesia problems Neg Hx   . Hypotension Neg Hx   . Malignant hyperthermia Neg Hx   . Pseudochol deficiency Neg Hx     SOCIAL HISTORY:  reports that she has never smoked. She has never used smokeless tobacco. She reports that she does not drink alcohol or use illicit drugs.  PERFORMANCE STATUS: The patient's performance status is 1 - Symptomatic but completely ambulatory  REVIEW OF SYSTEMS: Positive for anxiety.     Anxiety-related to financial  stress. 14 point review of systems was performed and is negative except as detailed under history of present illness and above  PHYSICAL EXAM: Most Recent Vital Signs: Blood pressure 139/56, pulse 86, temperature 97.6 F (36.4 C), temperature source Oral, resp. rate 18, weight 203 lb (92.08 kg), SpO2 97 %. General appearance: alert, cooperative, appears stated age, fatigued, no distress, moderately obese , anxious, and accompanied by her husband, teary eyed  Head: Normocephalic, without obvious abnormality, atraumatic Eyes: negative findings: lids and lashes normal, conjunctivae and sclerae normal, corneas clear and pupils equal, round, reactive to light and accomodation Throat: lips, mucosa, and tongue normal; teeth and gums normal Neck: no adenopathy and supple, symmetrical, trachea midline Lungs: clear to auscultation bilaterally and normal percussion bilaterally Heart: regular rate and rhythm, S1, S2 normal, no murmur, click, rub or gallop Abdomen: normal findings: bowel sounds normal, liver span normal to percussion, no bruits heard, no masses palpable, no organomegaly, no renal abnormalities palpable, spleen non-palpable and symmetric and abnormal findings:  mild tenderness in the lower abdomen Extremities: Homans sign is  negative, no sign of DVT and no edema, redness or tenderness in the calves or thighs Skin: Skin color, texture, turgor normal. No rashes or lesions Lymph nodes: Cervical, supraclavicular, and axillary nodes normal. Neurologic: Grossly normal  LABORATORY DATA:  I have reviewed the data as listed. CBC    Component Value Date/Time   WBC 101.9* 12/25/2015 0839   RBC 2.75* 12/25/2015 0839   HGB 9.5* 12/25/2015 0839   HCT 29.8* 12/25/2015 0839   PLT 371 12/25/2015 0839   MCV 108.4* 12/25/2015 0839   MCH 34.5* 12/25/2015 0839   MCHC 31.9 12/25/2015 0839   RDW 21.0* 12/25/2015 0839   LYMPHSABS 5.1* 12/25/2015 0839   MONOABS 2.0* 12/25/2015 0839   EOSABS 0.0 12/25/2015 0839   BASOSABS 0.0 12/25/2015 0839      Chemistry      Component Value Date/Time   NA 141 12/25/2015 0839   K 4.2 12/25/2015 0839   CL 105 12/25/2015 0839   CO2 27 12/25/2015 0839   BUN 18 12/25/2015 0839   CREATININE 0.93 12/25/2015 0839   CREATININE 0.87 07/14/2011 0938      Component Value Date/Time   CALCIUM 9.6 12/25/2015 0839   ALKPHOS 109 12/25/2015 0839   AST 35 12/25/2015 0839   ALT 20 12/25/2015 0839   BILITOT 0.6 12/25/2015 0839     Results for Andrea Martinez, Andrea Martinez (MRN [id number]) as of 11/20/2015 18:23  Ref. Range 11/20/2015 12:15  [facility] Latest Ref Range: 98-192 U/L 959 (H)      PATHOLOGY:     ASSESSMENT/PLAN:  CML Anemia Difficulty obtaining medication secondary to cost  The patient continues to feel very frustrated and anxious about the financial stress of affording treatment, tearing up while talking about it. We discussed anti depressants and she is agreeable to trying them but would like to wait a couple weeks.  In the interim I have also contacted our pharmacist Deedra Ehrich, and Dr. Florene Glen at [facility] to see if we have other options for obtaining her medication long term affordably.  We have tried sprycel and gleevec. We will also try to obtain tasigna.  I will see her again in 2 weeks.  She will continue with weekly labs and Hydrea.  I spent a significant amount of time trying to reassure the patient that we will somehow make this work out. She is to notify us in the interim with questions.  All  questions were answered. The patient knows to call the clinic with any problems, questions or concerns. We can certainly see the patient much sooner if necessary.  This document serves as a record of services personally performed by Ancil Linsey, MD. It was created on her behalf by Arlyce Harman, a trained medical scribe. The creation of this record is based on the scribe's personal observations and the provider's statements to them. This document has been checked and approved by the attending provider.  I have reviewed the above documentation for accuracy and completeness, and I agree with the above.  This note is electronically signed by: Molli Hazard, MD  12/25/2015 2:02 PM

## 2015-12-29 ENCOUNTER — Telehealth (HOSPITAL_COMMUNITY): Payer: Self-pay | Admitting: *Deleted

## 2015-12-29 NOTE — Telephone Encounter (Signed)
Cost of Sprycel: [id number]. Copay $2,800 approximately. Deductible patient owes out of pocket $4,950 for Part D. Once she pays this out of pocket ($4,950) she will then owe 5% of $13,558 which is $677 per month. I gave this information to the patient over the phone. We are working with [facility] as well as [facility] to see what we can get done for the patient for her drug coverage. We are currently waiting for [employer] to contact us back regarding their free drug program. We should hopefully hear something by 12/30/15. Patient aware of plans.

## 2015-12-31 ENCOUNTER — Other Ambulatory Visit (HOSPITAL_COMMUNITY): Payer: Medicare Other

## 2015-12-31 MED ORDER — NILOTINIB HCL 150 MG PO CAPS: 300.0000 mg | ORAL_CAPSULE | Freq: Two times a day (BID) | ORAL | Status: DC

## 2015-12-31 MED ORDER — ONDANSETRON HCL 8 MG PO TABS: 8.0000 mg | ORAL_TABLET | Freq: Three times a day (TID) | ORAL | Status: DC | PRN

## 2015-12-31 NOTE — Patient Instructions (Addendum)
Andrea Martinez   CHEMOTHERAPY INSTRUCTIONS  Rae Lips is part of a class of therapies known as tyrosine-kinase inhibitors, or TKIs. 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 Tasigna binds to a specific protein that is thought to cause overproduction of white blood cells, the root cause of Ph+CML. Tasigna is taken to help reduce the amount of leukemic cells in the body.   Common Side Effects: low blood counts, nausea, rash, headache, feeling tired, itching, vomiting, diarrhea, cough, constipation, muscle and joint pain, runny or stuffy nose/sneezing/sore throat, fever, night sweats  Call us right away if you feel like you have an irregular heart beat, feel lightheaded or faint. QT prolongation can occur with this drug. QT prolongation can cause an irregular heart beat.  Other seriuos side effects may include: low blood counts, tumor lysis syndrome (this is when you have a rapid breakdown of tumor cells - this can cause kidney failure and/or abnormal heart beat, inflammation of the pancreas (pancreatitis) - stomach abdominal pain, nausea/vomiting, fluid retention (holding too much fluid) - shortness of breath, rapid weight gain, swelling. Liver damage - yellow skin/eyes. Decreased blood flow to the leg, heart, or brain - chest pain/discomfort, numbness or weakness, problems walking or speaking, leg pain, leg feeling cold, change in the skin color of your leg. Bleeding - blood in urine, stool, gums, nose, etc.  How to take: Tasigna 150mg  capsule. Take 2 capsules (300mg  total) every 12 hours (in the am and pm) on an empty stomach. Swallow capsules whole. Do NOT eat two hours before or one after your Tasigna. If you miss a dose, take your next dose as scheduled. Do not double dose to make up for a missed dose. NO grapefruit or grapefruit juice and any supplement containing grapefruit extract while taking Tasigna. Foods containing grapefruit INCREASE the amount of Tasigna in your body.    Disposing of Tasigna -- If you can not tolerate Tasigna and need to discard of it - you must bring it to the [facility] so that we can properly discard it in a biohazard chemo bin.  Handling of Tasigna --- If anyone other than the patient handles Tasigna - they must wear gloves! Keep this drug out of the reach of animals, children, and child bearing age individuals.     EDUCATIONAL MATERIALS GIVEN AND REVIEWED: Chemotherapy and You booklet Specific Instructions Sheets: Tasigna, Zofran   SELF CARE ACTIVITIES WHILE ON CHEMOTHERAPY: Try to maintain a fluid intake of 64 oz a day (preferably decaff beverages or water). No alcohol intake., No aspirin or other medications unless approved by your oncologist., Eat foods that are light and easy to digest., Eat foods at cold or room temperature., No fried, fatty, or spicy foods immediately before or after treatment., Have teeth cleaned professionally before starting treatment. Keep dentures and partial plates clean., Use soft toothbrush and do not use mouthwashes that contain alcohol. Biotene is a good mouthwash that is available at most pharmacies or may be ordered by calling [phone]., Use warm salt water gargles (1 teaspoon salt per 1 quart warm water) before and after meals and at bedtime. Or you may rinse with 2 tablespoons of three -percent hydrogen peroxide mixed in eight ounces of water., Always use sunscreen with SPF (Sun Protection Factor) of 30 or higher., Use your nausea medication as directed to prevent nausea., Use your stool softener or laxative as directed to prevent constipation. and Use your anti-diarrheal medication as directed to stop diarrhea.  Please wash your hands for at least 30 seconds using warm soapy water. Handwashing is the #1 way to prevent the spread of germs. Stay away from sick people or people who are getting over a cold. If you develop respiratory systems such as green/yellow mucus production or productive cough or  persistent cough let us know and we will see if you need an antibiotic. It is a good idea to keep a pair of gloves on when going into grocery stores/[employer] to decrease your risk of coming into contact with germs on the carts, etc. Carry alcohol hand gel with you at all times and use it frequently if out in public. All foods need to be cooked thoroughly. No raw foods. No medium or undercooked meats, eggs. If your food is cooked medium well, it does not need to be hot pink or saturated with bloody liquid at all. Vegetables and fruits need to be washed/rinsed under the faucet with a dish detergent before being consumed. You can eat raw fruits and vegetables unless we tell you otherwise but it would be best if you cooked them or bought frozen. Do not eat off of salad bars or hot bars unless you really trust the cleanliness of the restaurant. If you need dental work, please let Dr. Whitney Muse know before you go for your appointment so that we can coordinate the best possible time for you in regards to your chemo regimen. You need to also let your dentist know that you are actively taking chemo. We may need to do labs prior to your dental appointment. We also want your bowels moving at least every other day. If this is not happening, we need to know so that we can get you on a bowel regimen to help you go.    MEDICATIONS: You have been given prescriptions for the following medications:  Ondansetron/Zofran 8mg  tablet. May take 1 tablet every 8 hours as needed for nausea/vomiting. (may cause constipation)  Over-the-Counter Meds:  Miralax 1 capful in 8 oz of fluid daily. May increase to two times a day if needed. This is a stool softener. If this doesn't work proceed you can add:  Senokot S  - start with 1 tablet two times a day and increase to 4 tablets two times a day if needed. (total of 8 tablets in a 24 hour period). This is a stimulant laxative.   Call us if this does not help your bowels move.   Imodium  2mg  capsule. Take 2 capsules after the 1st loose stool and then 1 capsule every 2 hours until you go a total of 12 hours without having a loose stool. Call the [facility] if loose stools continue. If diarrhea occurs @ bedtime, take 2 capsules @ bedtime. Then take 2 capsules every 4 hours until morning. Call [facility].   SYMPTOMS TO REPORT AS SOON AS POSSIBLE AFTER TREATMENT:  FEVER GREATER THAN 100.5 F  CHILLS WITH OR WITHOUT FEVER  NAUSEA AND VOMITING THAT IS NOT CONTROLLED WITH YOUR NAUSEA MEDICATION  UNUSUAL SHORTNESS OF BREATH  UNUSUAL BRUISING OR BLEEDING  TENDERNESS IN MOUTH AND THROAT WITH OR WITHOUT PRESENCE OF ULCERS  URINARY PROBLEMS  BOWEL PROBLEMS  UNUSUAL RASH    Wear comfortable clothing and clothing appropriate for easy access to any Portacath or PICC line. Let us know if there is anything that we can do to make your therapy better!      I have been informed and understand all of the instructions given  to me and have received a copy. I have been instructed to call the clinic [phone] or my family physician as soon as possible for continued medical care, if indicated. I do not have any more questions at this time but understand that I may call the [facility] or the Patient Navigator at [phone] during office hours should I have questions or need assistance in obtaining follow-up care.           Nilotinib Oral Capsule What is this medicine? NILOTINIB (nil OT i nib) is a medicine that targets proteins in cancer cells and stops the cells from growing. It is used to treat chronic myelogenous leukemia (CML). This medicine may be used for other purposes; ask your health care provider or pharmacist if you have questions. What should I tell my health care provider before I take this medicine? They need to know if you have any of these conditions: -heart disease -history of irregular heartbeat -history of pancreatitis -liver disease -low  magnesium or potassium levels in the body -QT prolongation -total gastrectomy -an unusual or allergic reaction to nilotinib, lactose, gelatin, other medicines, foods, dyes, or preservatives -pregnant or trying to get pregnant -breast-feeding How should I use this medicine? Take this medicine by mouth with a glass of water. Follow the directions on the prescription label. Take this medicine on an empty stomach, at least 1 hour before or 2 hours after food. Do not take with food or with grapefruit juice. Take H2-blockers at least 10 hours before or 2 hours after this medicine. Avoid taking antacids within 2 hours of taking this medicine. Do not cut, crush, or chew this medicine. If you cannot swallow the capsules whole, you may open the capsule and sprinkle the contents of each capsule in 1 teaspoon of applesauce. Immediately swallow the mixture. Do not store for future use. Take your medicine at regular intervals. Do not take it more often than directed. Do not stop taking except on your doctor's advice. A special MedGuide will be given to you by the pharmacist with each prescription and refill. Be sure to read this information carefully each time. Talk to your pediatrician regarding the use of this medicine in children. Special care may be needed. Overdosage: If you think you have taken too much of this medicine contact a poison control center or emergency room at once. NOTE: This medicine is only for you. Do not share this medicine with others. What if I miss a dose? If you miss a dose, do not make up the missing dose. Take your next dose as scheduled. Do not take double or extra doses. What may interact with this medicine? Do not take this medicine with any of the following medications: -amoxapine -astemizole -bupivacaine -cisapride -clozapine -cyclobenzaprine -disopyramide -droperidol -flecainide -grapefruit or grapefruit  juice -halofantrine -haloperidol -maprotiline -methadone -perphenazine -pimozide -quinidine -ranolazine -risperidone -sunitinib -tacrolimus -terfenadine -thioridazine -ziprasidone This medicine may also interact with the following medications: -antacids -antiviral medicines for HIV or AIDS -certain antibiotics like clarithromycin, erythromycin, telithromycin, troleandomycin -dexamethasone -medicines for blood pressure, heart disease, irregular heart beat -medicines for depression, anxiety, or psychotic disturbances -medicines for fungal infections like ketoconazole, itraconazole, voriconazole, fluconazole -medicines for seizures like carbamazepine, phenobarbital, phenytoin -medicines for stomach problems like cimetidine, famotidine, omeprazole, lansoprazole -medicines for sleep -mifepristone -propoxyphene -rifabutin -rifampin -rifapentine -St. John's Wort -tamoxifen -warfarin -zafirlukast This list may not describe all possible interactions. Give your health care provider a list of all the medicines, herbs, non-prescription drugs, or dietary  supplements you use. Also tell them if you smoke, drink alcohol, or use illegal drugs. Some items may interact with your medicine. What should I watch for while using this medicine? Visit your doctor for checks on your progress. You will need to have regular blood tests while on this medicine. Report any new symptoms promptly. Call your doctor or health care professional for advice if you get a fever, chills or sore throat, or other symptoms of a cold or flu. Do not treat yourself. This drug decreases your body's ability to fight infections. Try to avoid being around people who are sick. This medicine may increase your risk to bruise or bleed. Call your doctor or health care professional if you notice any unusual bleeding. Be careful brushing and flossing your teeth or using a toothpick because you may get an infection or bleed more  easily. If you have any dental work done, tell your dentist you are receiving this medicine. Avoid taking products that contain aspirin, acetaminophen, ibuprofen, naproxen, or ketoprofen unless instructed by your doctor. These medicines may hide a fever. Do not become pregnant while taking this medicine. Women should inform their doctor if they wish to become pregnant or think they might be pregnant. There is a potential for serious side effects to an unborn child. Talk to your health care professional or pharmacist for more information. Do not breast-feed an infant while taking this medicine. This medicine may affect blood sugar levels. If you have diabetes, check with your doctor or health care professional before you change your diet or the dose of your diabetic medicine. This drug may make you feel generally unwell. This is not uncommon, as chemotherapy can affect healthy cells as well as cancer cells. Report any side effects. Continue your course of treatment even though you feel ill unless your doctor tells you to stop. What side effects may I notice from receiving this medicine? Side effects that you should report to your doctor or health care professional as soon as possible: -allergic reactions like skin rash, itching or hives, swelling of the face, lips, or tongue -breathing problems -chest pain or palpitations -confusion, trouble speaking or understanding -dizziness or fainting -fast, irregular heartbeat -fever or chills, sore throat -increased hunger or thirst -increased urination -light-colored stools -pain, swelling, warmth in the leg -signs and symptoms of bleeding such as bloody or black, tarry stools; red or dark-brown urine; spitting up blood or brown material that looks like coffee grounds; red spots on the skin; unusual bruising or bleeding from the eye, gums, or nose -sudden numbness or weakness of the face, arm or leg -swelling of the ankles, feet, hands -trouble walking,  dizziness, loss of balance or coordination -unusually weak or tired -weight gain -yellowing of the eyes or skin Side effects that usually do not require medical attention (report to your doctor or health care professional if they continue or are bothersome): -constipation -diarrhea -headache -loss of appetite -nausea, vomiting -muscle aches -stomach pain -trouble sleeping This list may not describe all possible side effects. Call your doctor for medical advice about side effects. You may report side effects to FDA at 1-800-FDA-1088. Where should I keep my medicine? Keep out of the reach of children. Store at room temperature between 15 and 30 degrees C (59 and 86 degrees F). Throw away any unused medicine after the expiration date. NOTE: This sheet is a summary. It may not cover all possible information. If you have questions about this medicine, talk to your doctor,  pharmacist, or health care provider.    2016, Elsevier/Gold Standard. (2015-02-05 22:09:40) Ondansetron tablets What is this medicine? ONDANSETRON (on DAN se tron) is used to treat nausea and vomiting caused by chemotherapy. It is also used to prevent or treat nausea and vomiting after surgery. This medicine may be used for other purposes; ask your health care provider or pharmacist if you have questions. What should I tell my health care provider before I take this medicine? They need to know if you have any of these conditions: -heart disease -history of irregular heartbeat -liver disease -low levels of magnesium or potassium in the blood -an unusual or allergic reaction to ondansetron, granisetron, other medicines, foods, dyes, or preservatives -pregnant or trying to get pregnant -breast-feeding How should I use this medicine? Take this medicine by mouth with a glass of water. Follow the directions on your prescription label. Take your doses at regular intervals. Do not take your medicine more often than  directed. Talk to your pediatrician regarding the use of this medicine in children. Special care may be needed. Overdosage: If you think you have taken too much of this medicine contact a poison control center or emergency room at once. NOTE: This medicine is only for you. Do not share this medicine with others. What if I miss a dose? If you miss a dose, take it as soon as you can. If it is almost time for your next dose, take only that dose. Do not take double or extra doses. What may interact with this medicine? Do not take this medicine with any of the following medications: -apomorphine -certain medicines for fungal infections like fluconazole, itraconazole, ketoconazole, posaconazole, voriconazole -cisapride -dofetilide -dronedarone -pimozide -thioridazine -ziprasidone This medicine may also interact with the following medications: -carbamazepine -certain medicines for depression, anxiety, or psychotic disturbances -fentanyl -linezolid -MAOIs like Carbex, Eldepryl, Marplan, Nardil, and Parnate -methylene blue (injected into a vein) -other medicines that prolong the QT interval (cause an abnormal heart rhythm) -phenytoin -rifampicin -tramadol This list may not describe all possible interactions. Give your health care provider a list of all the medicines, herbs, non-prescription drugs, or dietary supplements you use. Also tell them if you smoke, drink alcohol, or use illegal drugs. Some items may interact with your medicine. What should I watch for while using this medicine? Check with your doctor or health care professional right away if you have any sign of an allergic reaction. What side effects may I notice from receiving this medicine? Side effects that you should report to your doctor or health care professional as soon as possible: -allergic reactions like skin rash, itching or hives, swelling of the face, lips or tongue -breathing problems -confusion -dizziness -fast or  irregular heartbeat -feeling faint or lightheaded, falls -fever and chills -loss of balance or coordination -seizures -sweating -swelling of the hands or feet -tightness in the chest -tremors -unusually weak or tired Side effects that usually do not require medical attention (report to your doctor or health care professional if they continue or are bothersome): -constipation or diarrhea -headache This list may not describe all possible side effects. Call your doctor for medical advice about side effects. You may report side effects to FDA at 1-800-FDA-1088. Where should I keep my medicine? Keep out of the reach of children. Store between 2 and 30 degrees C (36 and 86 degrees F). Throw away any unused medicine after the expiration date. NOTE: This sheet is a summary. It may not cover all possible information. If you  have questions about this medicine, talk to your doctor, pharmacist, or health care provider.    2016, Elsevier/Gold Standard. (2013-09-05 16:27:45)

## 2016-01-01 ENCOUNTER — Other Ambulatory Visit (HOSPITAL_COMMUNITY): Payer: Self-pay | Admitting: *Deleted

## 2016-01-01 ENCOUNTER — Telehealth (HOSPITAL_COMMUNITY): Payer: Self-pay

## 2016-01-01 ENCOUNTER — Encounter (HOSPITAL_BASED_OUTPATIENT_CLINIC_OR_DEPARTMENT_OTHER): Payer: Medicare Other

## 2016-01-01 ENCOUNTER — Encounter (HOSPITAL_COMMUNITY): Payer: Medicare Other

## 2016-01-01 ENCOUNTER — Other Ambulatory Visit (HOSPITAL_COMMUNITY): Payer: Medicare Other

## 2016-01-01 VITALS — BP 134/60 | HR 85 | Temp 97.9°F | Resp 20

## 2016-01-01 DIAGNOSIS — C9210 Chronic myeloid leukemia, BCR/ABL-positive, not having achieved remission: Secondary | ICD-10-CM

## 2016-01-01 DIAGNOSIS — D72829 Elevated white blood cell count, unspecified: Secondary | ICD-10-CM | POA: Diagnosis not present

## 2016-01-01 LAB — CBC WITH DIFFERENTIAL/PLATELET
BAND NEUTROPHILS: 8 %
BASOS ABS: 0 10*3/uL (ref 0.0–0.1)
BASOS PCT: 0 %
BLASTS: 1 %
EOS ABS: 0 10*3/uL (ref 0.0–0.7)
Eosinophils Relative: 0 %
HEMATOCRIT: 29.4 % — AB (ref 36.0–46.0)
Hemoglobin: 9.3 g/dL — ABNORMAL LOW (ref 12.0–15.0)
LYMPHS ABS: 2.6 10*3/uL (ref 0.7–4.0)
LYMPHS PCT: 2 %
MCH: 35 pg — AB (ref 26.0–34.0)
MCHC: 31.6 g/dL (ref 30.0–36.0)
MCV: 110.5 fL — AB (ref 78.0–100.0)
MONOS PCT: 0 %
Metamyelocytes Relative: 7 %
Monocytes Absolute: 0 10*3/uL — ABNORMAL LOW (ref 0.1–1.0)
Myelocytes: 5 %
NEUTROS ABS: 124.6 10*3/uL — AB (ref 1.7–7.7)
NEUTROS PCT: 77 %
NRBC: 0 /100{WBCs}
Platelets: 305 10*3/uL (ref 150–400)
Promyelocytes Absolute: 0 %
RBC: 2.66 MIL/uL — ABNORMAL LOW (ref 3.87–5.11)
RDW: 20.4 % — ABNORMAL HIGH (ref 11.5–15.5)
WBC: 128.5 10*3/uL (ref 4.0–10.5)

## 2016-01-01 LAB — COMPREHENSIVE METABOLIC PANEL
ALK PHOS: 106 U/L (ref 38–126)
ALT: 23 U/L (ref 14–54)
AST: 42 U/L — AB (ref 15–41)
Albumin: 4.3 g/dL (ref 3.5–5.0)
Anion gap: 9 (ref 5–15)
BILIRUBIN TOTAL: 0.6 mg/dL (ref 0.3–1.2)
BUN: 17 mg/dL (ref 6–20)
CALCIUM: 9.5 mg/dL (ref 8.9–10.3)
CO2: 27 mmol/L (ref 22–32)
CREATININE: 0.87 mg/dL (ref 0.44–1.00)
Chloride: 105 mmol/L (ref 101–111)
Glucose, Bld: 124 mg/dL — ABNORMAL HIGH (ref 65–99)
Potassium: 3.8 mmol/L (ref 3.5–5.1)
Sodium: 141 mmol/L (ref 135–145)
TOTAL PROTEIN: 7.4 g/dL (ref 6.5–8.1)

## 2016-01-01 LAB — PHOSPHORUS: Phosphorus: 4 mg/dL (ref 2.5–4.6)

## 2016-01-01 LAB — MAGNESIUM: MAGNESIUM: 2.1 mg/dL (ref 1.7–2.4)

## 2016-01-01 MED ORDER — ALPRAZOLAM 0.25 MG PO TABS: 0.2500 mg | ORAL_TABLET | Freq: Four times a day (QID) | ORAL | Status: DC | PRN

## 2016-01-01 MED ORDER — DASATINIB 100 MG PO TABS: 100.0000 mg | ORAL_TABLET | Freq: Every day | ORAL | Status: DC

## 2016-01-01 MED ORDER — ESCITALOPRAM OXALATE 10 MG PO TABS: 10.0000 mg | ORAL_TABLET | Freq: Every day | ORAL | Status: DC

## 2016-01-01 NOTE — Telephone Encounter (Signed)
CRITICAL VALUE ALERT Critical value received: WBC [id number],460 Date of notification:  01/01/2016 Time of notification: 0910 Critical value read back:  Yes.   Nurse who received alert:  Forest Gleason RN MD notified Kirby Crigler PA-C

## 2016-01-01 NOTE — Addendum Note (Signed)
Addended by: Gerhard Perches on: 01/01/2016 11:29 AM   Modules accepted: Orders

## 2016-01-01 NOTE — Progress Notes (Signed)
Consent signed for Tasigna. Teaching done also.

## 2016-01-02 ENCOUNTER — Telehealth (HOSPITAL_COMMUNITY): Payer: Self-pay | Admitting: *Deleted

## 2016-01-02 NOTE — Telephone Encounter (Signed)
Andrea Martinez to start her Tasigna tonight.

## 2016-01-05 ENCOUNTER — Other Ambulatory Visit (HOSPITAL_COMMUNITY): Payer: Self-pay | Admitting: *Deleted

## 2016-01-05 DIAGNOSIS — C9210 Chronic myeloid leukemia, BCR/ABL-positive, not having achieved remission: Secondary | ICD-10-CM

## 2016-01-05 MED ORDER — DASATINIB 100 MG PO TABS: 100.0000 mg | ORAL_TABLET | Freq: Every day | ORAL | Status: DC

## 2016-01-06 ENCOUNTER — Encounter: Payer: Self-pay | Admitting: *Deleted

## 2016-01-06 NOTE — Progress Notes (Signed)
Andrea Martinez [facility] Psychosocial Distress Screening Clinical Social Work  Clinical Social Work was referred by distress screening protocol.  The patient scored a 8 on the Psychosocial Distress Thermometer which indicates severe distress. Clinical Social Worker phoned pt to assess for distress and other psychosocial needs. Pt reports she is still concerned about financial issues re. her medicines. Pt open to talking and CSW introduced self and reviewed role of CSW, support programs and coping techniques. Pt reports she has great family support and this has really helped her. Pt feels her depression is ok currently and shared her anxiety is much better after having a plan. Pt open to support groups and agrees to reach out to CSW as needed. Pt has not taken the xanax, but feels the lexapro is starting to help her. She appreciated the support today.   ONCBCN DISTRESS SCREENING 01/01/2016  Screening Type Initial Screening  Distress experienced in past week (1-10) 8  Practical problem type Insurance  Emotional problem type Depression;Adjusting to illness  Physical Problem type Pain;Sleep/insomnia;Loss of appetitie;Constipation/diarrhea;Tingling hands/feet;Swollen arms/legs  Physician notified of physical symptoms Yes  Referral to financial advocate Yes    Clinical Social Worker follow up needed: No.  If yes, follow up plan: Loren Racer, [place] Tuesdays   [phone]

## 2016-01-07 ENCOUNTER — Other Ambulatory Visit (HOSPITAL_COMMUNITY): Payer: Self-pay | Admitting: *Deleted

## 2016-01-07 DIAGNOSIS — C9210 Chronic myeloid leukemia, BCR/ABL-positive, not having achieved remission: Secondary | ICD-10-CM

## 2016-01-08 ENCOUNTER — Encounter (HOSPITAL_COMMUNITY): Payer: Self-pay | Admitting: Hematology & Oncology

## 2016-01-08 ENCOUNTER — Other Ambulatory Visit: Payer: Self-pay

## 2016-01-08 ENCOUNTER — Encounter (HOSPITAL_COMMUNITY): Payer: Medicare Other

## 2016-01-08 ENCOUNTER — Encounter (HOSPITAL_BASED_OUTPATIENT_CLINIC_OR_DEPARTMENT_OTHER): Payer: Medicare Other | Admitting: Hematology & Oncology

## 2016-01-08 ENCOUNTER — Other Ambulatory Visit: Payer: Medicare Other

## 2016-01-08 VITALS — BP 137/59 | HR 83 | Temp 97.6°F | Resp 18 | Wt 215.0 lb

## 2016-01-08 DIAGNOSIS — E039 Hypothyroidism, unspecified: Secondary | ICD-10-CM

## 2016-01-08 DIAGNOSIS — C9210 Chronic myeloid leukemia, BCR/ABL-positive, not having achieved remission: Secondary | ICD-10-CM

## 2016-01-08 DIAGNOSIS — D72829 Elevated white blood cell count, unspecified: Secondary | ICD-10-CM | POA: Diagnosis not present

## 2016-01-08 LAB — COMPREHENSIVE METABOLIC PANEL
ALBUMIN: 4.5 g/dL (ref 3.5–5.0)
ALT: 38 U/L (ref 14–54)
AST: 41 U/L (ref 15–41)
Alkaline Phosphatase: 130 U/L — ABNORMAL HIGH (ref 38–126)
Anion gap: 10 (ref 5–15)
BUN: 21 mg/dL — AB (ref 6–20)
CHLORIDE: 105 mmol/L (ref 101–111)
CO2: 25 mmol/L (ref 22–32)
Calcium: 9.4 mg/dL (ref 8.9–10.3)
Creatinine, Ser: 0.86 mg/dL (ref 0.44–1.00)
GFR calc Af Amer: >60 mL/min (ref >60)
GLUCOSE: 125 mg/dL — AB (ref 65–99)
POTASSIUM: 4 mmol/L (ref 3.5–5.1)
SODIUM: 140 mmol/L (ref 135–145)
Total Bilirubin: 1 mg/dL (ref 0.3–1.2)
Total Protein: 7.6 g/dL (ref 6.5–8.1)

## 2016-01-08 LAB — CBC WITH DIFFERENTIAL/PLATELET
BAND NEUTROPHILS: 20 %
BASOS ABS: 0 10*3/uL (ref 0.0–0.1)
BASOS PCT: 0 %
Blasts: 1 %
EOS ABS: 0 10*3/uL (ref 0.0–0.7)
EOS PCT: 0 %
HCT: 29.6 % — ABNORMAL LOW (ref 36.0–46.0)
Hemoglobin: 9.4 g/dL — ABNORMAL LOW (ref 12.0–15.0)
LYMPHS ABS: 1.3 10*3/uL (ref 0.7–4.0)
LYMPHS PCT: 1 %
MCH: 35.1 pg — ABNORMAL HIGH (ref 26.0–34.0)
MCHC: 31.8 g/dL (ref 30.0–36.0)
MCV: 110.4 fL — ABNORMAL HIGH (ref 78.0–100.0)
MONO ABS: 0 10*3/uL — AB (ref 0.1–1.0)
Metamyelocytes Relative: 12 %
Monocytes Relative: 0 %
Myelocytes: 23 %
NEUTROS ABS: 123.7 10*3/uL — AB (ref 1.7–7.7)
NEUTROS PCT: 34 %
NRBC: 0 /100{WBCs}
PLATELETS: 313 10*3/uL (ref 150–400)
Promyelocytes Absolute: 9 %
RBC: 2.68 MIL/uL — ABNORMAL LOW (ref 3.87–5.11)
RDW: 19.7 % — AB (ref 11.5–15.5)
WBC: 126.2 10*3/uL (ref 4.0–10.5)

## 2016-01-08 LAB — URINALYSIS, ROUTINE W REFLEX MICROSCOPIC
Bilirubin Urine: NEGATIVE
Glucose, UA: NEGATIVE mg/dL
HGB URINE DIPSTICK: NEGATIVE
Ketones, ur: NEGATIVE mg/dL
Leukocytes, UA: NEGATIVE
Nitrite: NEGATIVE
PH: 5 (ref 5.0–8.0)
Protein, ur: NEGATIVE mg/dL

## 2016-01-08 MED ORDER — TRAMADOL HCL 50 MG PO TABS: 50.0000 mg | ORAL_TABLET | Freq: Four times a day (QID) | ORAL | Status: DC | PRN

## 2016-01-08 NOTE — Progress Notes (Signed)
CRITICAL VALUE ALERT Critical value received:  WBC 126.15 Date of notification:  01/08/2016 Time of notification: 0922 Critical value read back:  Yes.   Nurse who received alert:  Shellia Carwin RN MD notified (1st page):  Kirby Crigler PA

## 2016-01-08 NOTE — Progress Notes (Signed)
[facility] Hematology/Oncology Consultation   Name: Andrea Martinez      MRN: [id number]   Date: 01/31/2016 Time:3:54 PM   REFERRING PHYSICIAN:  Sinda Du, MD   DIAGNOSIS:  CML    HISTORY OF PRESENT ILLNESS:   Andrea Martinez is a 70 year old white American female here for additional f/u of CML  Andrea Martinez is accompanied by her husband.   She has just received and started [place]. Too early to have any complaints.  She is currently relieved that she has the medication.  She has stopped her hydrea.  She still complains of significant hair loss although it is not obvious   From [facility]: (Consultation with Dr. Florene Glen)  Her blood counts are largely the same today as earlier this week. Today, her WBCS are 162.9k with an ANC of 81.6k and platelets are 258k. Of note, uric acid is elevated to 8.6 and LDH is elevated to 985. Creatinine is within normal limits at 0.86. On exam, I had difficulty feeling her spleen (there is mild fullness very laterally). We discussed the mechanism of leukemia and I informed her that with a white count this high (20x normal limit), we become concerned about acute leukemia. Fortunately, we did not notice an increase in blast cells today, which indicates that she likely has chronic myeloid leukemia as opposed to acute leukemia. I explained that we are now able to manage CML with high success rates on an outpatient basis with oral medication. I furthermore discussed that we will perform additional testing for chromosomal abnormalities (including Philadelphia chromosome) to confirm a diagnosis of CML. PCR testing is also pending. I recommend starting Hydrea to prevent her white count from increasing further and starting Allopurinol to help protect her kidneys.  I explained that if CML is confirmed, we will plan to start her on an oral TKI indefinitely. If testing does not confirm CML, the next step would be a bone marrow biopsy. However, at this  point we feel that she likely has CML and will not need a bone marrow biopsy.   PAST MEDICAL HISTORY:   Past Medical History  Diagnosis Date  . Gastric ulcer 2011    EGD, 5/11  . Hypothyroidism     not on meds, followed by Dr. Elyse Hsu  . PONV (postoperative nausea and vomiting)   . Lipoma     left upper arm  . UTI (lower urinary tract infection)     frequent  . Leukocytosis 11/20/2015  . 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 Codeine Nausea Only    Headache  . Esomeprazole Magnesium Nausea Only  . Sulfonamide Derivatives Nausea Only    Headache  . Fluconazole Rash    Redness and blistering on left thigh      MEDICATIONS: I have reviewed the patient's current medications.    Current Outpatient Prescriptions on File Prior to Visit  Medication Sig Dispense Refill  . acetaminophen (TYLENOL) 500 MG tablet Take 500 mg by mouth every 6 (six) hours as needed.    Marland Kitchen allopurinol (ZYLOPRIM) 300 MG tablet Take 1 tablet by mouth daily.  2  . cetirizine (ZYRTEC) 10 MG tablet Take 10 mg by mouth as needed for allergies.    . Cholecalciferol (VITAMIN D-3) 5000 UNITS TABS Take 5,000 Units by mouth daily.      . Cholestyramine Light (PREVALITE PO) Take 4 g by mouth  as needed.    . conjugated estrogens (PREMARIN) vaginal cream Place 1 Applicatorful vaginally 2 (two) times a week. 1/2 to 1g    . escitalopram (LEXAPRO) 10 MG tablet Take 1 tablet (10 mg total) by mouth daily. 30 tablet 2  . Multiple Vitamins-Minerals (CENTRUM SILVER PO) Take 1 capsule by mouth daily.    . nilotinib (TASIGNA) 150 MG capsule Take 2 capsules (300 mg total) by mouth every 12 (twelve) hours. 120 capsule 0  . Probiotic Product (DIGESTIVE ADVANTAGE GUMMIES PO) Take 2 tablets by mouth daily.    Marland Kitchen triamcinolone (NASACORT ALLERGY 24HR) 55 MCG/ACT AERO nasal inhaler Place 2 sprays into the nose as needed.    . vitamin B-12 (CYANOCOBALAMIN)  1000 MCG tablet Take 5,000 mcg by mouth daily.     Marland Kitchen ALPRAZolam (XANAX) 0.25 MG tablet Take 1 tablet (0.25 mg total) by mouth every 6 (six) hours as needed for anxiety. 30 tablet 1  . ondansetron (ZOFRAN) 8 MG tablet Take 1 tablet (8 mg total) by mouth every 8 (eight) hours as needed for nausea or vomiting. 30 tablet 2   No current facility-administered medications on file prior to visit.     PAST SURGICAL HISTORY Past Surgical History  Procedure Laterality Date  . Esophagogastroduodenoscopy  02/2010    gastric ulcers  . Partial hysterectomy      vaginal at age 61 years of age  . Nose surgery    . Lipoma excision  08/02/2011    left shoulder  . Colonoscopy  01/2004    [facility], few small tics  . Esophagogastroduodenoscopy  12/04/2012    PJA:[id number] gastritis (inflammation) was found in the gastric antrum; multiple biopsies The duodenal mucosa showed no abnormalities in the bulb and second portion of the duodenum  . Bravo ph study  12/04/2012    Procedure: BRAVO [place];  Surgeon: [name], MD;  [facility];  Service: Endoscopy;  Laterality: N/A;  . Toe debridement  1962    lt great toe  . Cholecystectomy N/A 01/03/2014    Procedure: LAPAROSCOPIC CHOLECYSTECTOMY WITH INTRAOPERATIVE CHOLANGIOGRAM;  Surgeon: Joyice Faster. Cornett, MD;  Location: [place];  Service: General;  Laterality: N/A;    FAMILY HISTORY: Family History  Problem Relation Age of Onset  . Lung cancer Father   . COPD Mother   . Colon cancer Neg Hx   . Anesthesia problems Neg Hx   . Hypotension Neg Hx   . Malignant hyperthermia Neg Hx   . Pseudochol deficiency Neg Hx     SOCIAL HISTORY:  reports that she has never smoked. She has never used smokeless tobacco. She reports that she does not drink alcohol or use illicit drugs.  PERFORMANCE STATUS: The patient's performance status is 1 - Symptomatic but completely ambulatory  REVIEW OF SYSTEMS: Positive for anxiety.      Anxiety-related to financial stress. 14 point review of systems was performed and is negative except as detailed under history of present illness and above  PHYSICAL EXAM: Most Recent Vital Signs: Blood pressure 137/59, pulse 83, temperature 97.6 F (36.4 C), temperature source Oral, resp. rate 18, weight 215 lb (97.523 kg), SpO2 97 %. General appearance: alert, cooperative, appears stated age, fatigued, no distress, moderately obese , anxious, and accompanied by her husband, teary eyed  Head: Normocephalic, without obvious abnormality, atraumatic Eyes: negative findings: lids and lashes normal, conjunctivae and sclerae normal, corneas clear and pupils equal, round, reactive to light and accomodation Throat:  lips, mucosa, and tongue normal; teeth and gums normal Neck: no adenopathy and supple, symmetrical, trachea midline Lungs: clear to auscultation bilaterally and normal percussion bilaterally Heart: regular rate and rhythm, S1, S2 normal, no murmur, click, rub or gallop Abdomen: normal findings: bowel sounds normal, liver span normal to percussion, no bruits heard, no masses palpable, no organomegaly, no renal abnormalities palpable, spleen non-palpable and symmetric and abnormal findings:  mild tenderness in the lower abdomen Extremities: Homans sign is negative, no sign of DVT and no edema, redness or tenderness in the calves or thighs Skin: Skin color, texture, turgor normal. No rashes or lesions Lymph nodes: Cervical, supraclavicular, and axillary nodes normal. Neurologic: Grossly normal  LABORATORY DATA:  I have reviewed the data as listed. Results for DARIANNE, MURALLES (MRN [id number])   Ref. Range 01/08/2016 08:49  Sodium Latest Ref Range: 135-145 mmol/L 140  Potassium Latest Ref Range: 3.5-5.1 mmol/L 4.0  Chloride Latest Ref Range: 101-111 mmol/L 105  CO2 Latest Ref Range: 22-32 mmol/L 25  BUN Latest Ref Range: 6-20 mg/dL 21 (H)  Creatinine Latest Ref Range: 0.44-1.00 mg/dL 0.86    Calcium Latest Ref Range: 8.9-10.3 mg/dL 9.4  EGFR (Non-African Amer.) Latest Ref Range: >60 mL/min >60  EGFR (African American) Latest Ref Range: >60 mL/min >60  Glucose Latest Ref Range: 65-99 mg/dL 125 (H)  Anion gap Latest Ref Range: 5-15  10  Alkaline Phosphatase Latest Ref Range: 38-126 U/L 130 (H)  Albumin Latest Ref Range: 3.5-5.0 g/dL 4.5  AST Latest Ref Range: 15-41 U/L 41  ALT Latest Ref Range: 14-54 U/L 38  Total Protein Latest Ref Range: 6.5-8.1 g/dL 7.6  Total Bilirubin Latest Ref Range: 0.3-1.2 mg/dL 1.0  WBC Latest Ref Range: 4.0-10.5 K/uL 126.2 (HH)  RBC Latest Ref Range: 3.87-5.11 MIL/uL 2.68 (L)  Hemoglobin Latest Ref Range: 12.0-15.0 g/dL 9.4 (L)  HCT Latest Ref Range: 36.0-46.0 % 29.6 (L)  MCV Latest Ref Range: 78.0-100.0 fL 110.4 (H)  MCH Latest Ref Range: 26.0-34.0 pg 35.1 (H)  MCHC Latest Ref Range: 30.0-36.0 g/dL 31.8  RDW Latest Ref Range: 11.5-15.5 % 19.7 (H)  Platelets Latest Ref Range: 150-400 K/uL 313  Neutrophils Latest Units: % 34  Lymphocytes Latest Units: % 1  Monocytes Relative Latest Units: % 0  Eosinophil Latest Units: % 0  Basophil Latest Units: % 0  NEUT# Latest Ref Range: 1.7-7.7 K/uL 123.7 (H)  Lymphocyte # Latest Ref Range: 0.7-4.0 K/uL 1.3  Monocyte # Latest Ref Range: 0.1-1.0 K/uL 0.0 (L)  Eosinophils Absolute Latest Ref Range: 0.0-0.7 K/uL 0.0  Basophils Absolute Latest Ref Range: 0.0-0.1 K/uL 0.0  Myelocytes Latest Units: % 23  Metamyelocytes Relative Latest Units: % 12  Promyelocytes Absolute Latest Units: % 9  Blasts Latest Units: % 1  nRBC Latest Ref Range: 0 /100 WBC 0  Band Neutrophils Latest Units: % 20    Results for STACYANN, MCCONAUGHY (MRN [id number]) as of 11/20/2015 18:23  Ref. Range 11/20/2015 12:15  LDH Latest Ref Range: 98-192 U/L 959 (H)      PATHOLOGY:     ASSESSMENT/PLAN:  CML Anemia Difficulty obtaining medication secondary to cost Tasigna 01/06/2016  EKG today. She is on lexapro, for now she will continue  with close monitoring of her QT interval.  She is to continue on tasigna as prescribed.  Her anxiety is somewhat better.  I have recommended follow-up in one week with labs, including phosphorus.  All questions were answered. The patient knows to call the clinic with any  problems, questions or concerns. We can certainly see the patient much sooner if necessary.  This document serves as a record of services personally performed by Ancil Linsey, MD. It was created on her behalf by Arlyce Harman, a trained medical scribe. The creation of this record is based on the scribe's personal observations and the provider's statements to them. This document has been checked and approved by the attending provider.  I have reviewed the above documentation for accuracy and completeness, and I agree with the above.  This note is electronically signed by: Molli Hazard, MD  01/31/2016 3:54 PM

## 2016-01-08 NOTE — Patient Instructions (Addendum)
[place] at [facility] Discharge Instructions  RECOMMENDATIONS MADE BY THE CONSULTANT AND ANY TEST RESULTS WILL BE SENT TO YOUR REFERRING PHYSICIAN.    Exam and discussion by Dr Whitney Muse today Urinalysis today, we will call you the results EKG today, we will monitor this closely, we will get another next week Ultram sent to the pharmacy, you can use 1 every 6 hours for pain if you need it Continue taking tasigna as prescribed  Return to see the doctor in 1 week with lab work Please call the clinic if you have any questions or concerns     Thank you for choosing [place] at [facility] to provide your oncology and hematology care.  To afford each patient quality time with our provider, please arrive at least 15 minutes before your scheduled appointment time.   Beginning January 23rd 2017 lab work for the [facility] will be done in the  Main lab at [facility] on 1st floor. If you have a lab appointment with the [facility] please come in thru the  Main Entrance and check in at the main information desk  You need to re-schedule your appointment should you arrive 10 or more minutes late.  We strive to give you quality time with our providers, and arriving late affects you and other patients whose appointments are after yours.  Also, if you no show three or more times for appointments you may be dismissed from the clinic at the providers discretion.     Again, thank you for choosing [facility].  Our hope is that these requests will decrease the amount of time that you wait before being seen by our physicians.       _____________________________________________________________  Should you have questions after your visit to [facility], please contact our office at [phone] between the hours of 8:30 a.m. and 4:30 p.m.  Voicemails left after 4:30 p.m. will not be returned until the following business day.   For prescription refill requests, have your pharmacy contact our office.

## 2016-01-13 NOTE — Progress Notes (Signed)
OTC yeast medication, ask her pharmacist. Dr.P

## 2016-01-16 ENCOUNTER — Encounter (HOSPITAL_COMMUNITY): Payer: Self-pay | Admitting: Oncology

## 2016-01-16 ENCOUNTER — Encounter (HOSPITAL_COMMUNITY): Payer: Medicare Other

## 2016-01-16 ENCOUNTER — Encounter (HOSPITAL_COMMUNITY): Payer: Medicare Other | Attending: Oncology | Admitting: Oncology

## 2016-01-16 ENCOUNTER — Other Ambulatory Visit: Payer: Self-pay

## 2016-01-16 VITALS — BP 144/60 | HR 84 | Temp 98.2°F | Resp 18 | Wt 200.2 lb

## 2016-01-16 DIAGNOSIS — D72829 Elevated white blood cell count, unspecified: Secondary | ICD-10-CM | POA: Insufficient documentation

## 2016-01-16 DIAGNOSIS — C9210 Chronic myeloid leukemia, BCR/ABL-positive, not having achieved remission: Secondary | ICD-10-CM | POA: Insufficient documentation

## 2016-01-16 LAB — COMPREHENSIVE METABOLIC PANEL
ALT: 32 U/L (ref 14–54)
ANION GAP: 8 (ref 5–15)
AST: 29 U/L (ref 15–41)
Albumin: 4.2 g/dL (ref 3.5–5.0)
Alkaline Phosphatase: 139 U/L — ABNORMAL HIGH (ref 38–126)
BUN: 22 mg/dL — ABNORMAL HIGH (ref 6–20)
CHLORIDE: 106 mmol/L (ref 101–111)
CO2: 26 mmol/L (ref 22–32)
Calcium: 9.2 mg/dL (ref 8.9–10.3)
Creatinine, Ser: 0.76 mg/dL (ref 0.44–1.00)
Glucose, Bld: 119 mg/dL — ABNORMAL HIGH (ref 65–99)
POTASSIUM: 4.5 mmol/L (ref 3.5–5.1)
SODIUM: 140 mmol/L (ref 135–145)
Total Bilirubin: 0.9 mg/dL (ref 0.3–1.2)
Total Protein: 7.5 g/dL (ref 6.5–8.1)

## 2016-01-16 LAB — CBC WITH DIFFERENTIAL/PLATELET
Basophils Absolute: 0.1 10*3/uL (ref 0.0–0.1)
Basophils Relative: 0 %
EOS ABS: 0.1 10*3/uL (ref 0.0–0.7)
EOS PCT: 1 %
HCT: 27.4 % — ABNORMAL LOW (ref 36.0–46.0)
Hemoglobin: 8.7 g/dL — ABNORMAL LOW (ref 12.0–15.0)
LYMPHS ABS: 1.6 10*3/uL (ref 0.7–4.0)
Lymphocytes Relative: 7 %
MCH: 34.9 pg — AB (ref 26.0–34.0)
MCHC: 31.8 g/dL (ref 30.0–36.0)
MCV: 110 fL — AB (ref 78.0–100.0)
MONOS PCT: 8 %
Monocytes Absolute: 1.8 10*3/uL — ABNORMAL HIGH (ref 0.1–1.0)
NEUTROS PCT: 84 %
Neutro Abs: 20.6 10*3/uL — ABNORMAL HIGH (ref 1.7–7.7)
PLATELETS: 279 10*3/uL (ref 150–400)
RBC: 2.49 MIL/uL — ABNORMAL LOW (ref 3.87–5.11)
RDW: 18.3 % — AB (ref 11.5–15.5)
WBC: 24.2 10*3/uL — ABNORMAL HIGH (ref 4.0–10.5)

## 2016-01-16 NOTE — Assessment & Plan Note (Addendum)
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  Currently on Tasigna 150 mg PO BID beginning on 01/02/2016 with an excellent response thus far.  Oncology history updated.  I personally reviewed and went over laboratory results with the patient.  The results are noted within this dictation.  She is excited to learn of her lab work today and her response to therapy in such a short amount of time.  EKG today.  EKG today demonstrates a QT/QTc of 364/419 compared to a QTc of 440 ms 2 weeks ago.  Will need to continue to follow cloesly while on Tasigna and Lexapro as they both can increase QTc interval.  Weekly labs: CBC diff, CMET, LDH, Mg, Phosphorus  EKG in 2 weeks  Return in 2 weeks for follow-up.  She has a scheduled follow-up appointment with Dr. Florene Glen at the end of March 2017.  We've discussed the timing of her medications.  She takes her Tasigna at 10 AM and 10 PM.  This schedule work very well for her.  She takes her Lexapro at 7 PM, but reports falling asleep 30 minutes afterwards and having a difficult time remaining awake for the rest of the evening.  I have reviewed the interaction between [place].  There are no immediate interactions, other than the known QTc prolongation.  As a result, she will take her Lexapro at approximately 11 PM which is closer to her bed time.

## 2016-01-16 NOTE — Patient Instructions (Signed)
..  [place] at [facility] Discharge Instructions  RECOMMENDATIONS MADE BY THE CONSULTANT AND ANY TEST RESULTS WILL BE SENT TO YOUR REFERRING PHYSICIAN.  Continue Tasigna Labs weekly EKG 2 weeks Return in 2 weeks and 4 weeks   Thank you for choosing [place] at [facility] to provide your oncology and hematology care.  To afford each patient quality time with our provider, please arrive at least 15 minutes before your scheduled appointment time.   Beginning January 23rd 2017 lab work for the [facility] will be done in the  Main lab at [facility] on 1st floor. If you have a lab appointment with the [place] please come in thru the  Main Entrance and check in at the main information desk  You need to re-schedule your appointment should you arrive 10 or more minutes late.  We strive to give you quality time with our providers, and arriving late affects you and other patients whose appointments are after yours.  Also, if you no show three or more times for appointments you may be dismissed from the clinic at the providers discretion.     Again, thank you for choosing [facility].  Our hope is that these requests will decrease the amount of time that you wait before being seen by our physicians.       _____________________________________________________________  Should you have questions after your visit to [facility], please contact our office at [phone] between the hours of 8:30 a.m. and 4:30 p.m.  Voicemails left after 4:30 p.m. will not be returned until the following business day.  For prescription refill requests, have your pharmacy contact our office.

## 2016-01-16 NOTE — Progress Notes (Signed)
Andrea Bogus, MD [address] [place] [address]  Chronic myeloid leukemia, BCR/ABL-positive, not having achieved remission (Andrea Martinez) - Plan: CBC with Differential, Comprehensive metabolic panel, Lactate dehydrogenase, Magnesium, Phosphorus, EKG 12-Lead  CURRENT THERAPY: Tasigna 150 mg PO BID beginning on 01/02/2016.  INTERVAL HISTORY: Andrea Martinez 70 y.o. female returns for followup of CML, Andrea Martinez +.    Chronic myelogenous leukemia (CML), BCR-ABL1-positive ([place])   11/21/2015 Initial Diagnosis Chronic myelogenous leukemia (CML), BCR-ABL1-positive ([place])   11/21/2015 Miscellaneous Seen by Dr. Florene Glen [facility]   11/21/2015 -  Chemotherapy Hydrea 1000 mg and Allopurinol 300 mg daily (due to high uric acid level)   11/21/2015 Tumor Marker BCR P210 Ratio of Fusion to Control, Blood 0.8941    P210 IS % Ratio, Blood 85.834%     01/02/2016 -  Chemotherapy Tasigna   01/08/2016 Miscellaneous EKG- QT/QTc: 382/440 ms   01/16/2016 Miscellaneous EKG- QT/QTc: 364/419    I personally reviewed and went over laboratory results with the patient.  The results are noted within this dictation.  She is excited to learn of her lab results and her WBC.  I personally reviewed and went over radiographic studies with the patient.  The results are noted within this dictation.  Mammogram on 12/09/2015 was BIRADS 1.  She reports some "tightness" in her throat.  She denies any dysphagia or sore throat.  She reports that this feeling has occurred before and resolved spontaneously.  She notes a cough that is non-productive.  She denies any fevers at home.  "I haven't taken any of my anxiety pills."  She admits that she is less nervous than she has been in the past.  She notes 1 episode of nausea on Monday.  She took 1 zofran and has not been nauseated since.  She reports 1 day of constipation following Zofran however.  This was corrected with prune juice, she reports.  She notes falling asleep  about 30 minutes after taking Lexapro.  She reports that she can fall asleep in the middle of an evening activity, including doing bills.  She takes it at 7:30 PM, but goes to bed at 10:30 or 11 PM.   Past Medical History  Diagnosis Date  . Gastric ulcer 2011    EGD, 5/11  . Hypothyroidism     not on meds, followed by Dr. Elyse Hsu  . PONV (postoperative nausea and vomiting)   . Lipoma     left upper arm  . UTI (lower urinary tract infection)     frequent  . Leukocytosis 11/20/2015  . Chronic myelogenous leukemia (CML), BCR-ABL1-positive ([place]) 11/20/2015    has UNSPECIFIED DISORDER OF THYROID; PUD; NAUSEA; ABDOMINAL PAIN RIGHT UPPER QUADRANT; UTI'S, HX OF; Mass of arm; Lipoma; Lipoma of arm; Colon cancer screening; Rib pain; Dyspepsia; Biliary dyskinesia; and Chronic myelogenous leukemia (CML), BCR-ABL1-positive ([place]) on her problem list.     is allergic to codeine; esomeprazole magnesium; sulfonamide derivatives; and fluconazole.  Current Outpatient Prescriptions on File Prior to Visit  Medication Sig Dispense Refill  . acetaminophen (TYLENOL) 500 MG tablet Take 500 mg by mouth every 6 (six) hours as needed.    Marland Kitchen allopurinol (ZYLOPRIM) 300 MG tablet Take 1 tablet by mouth daily.  2  . cetirizine (ZYRTEC) 10 MG tablet Take 10 mg by mouth as needed for allergies.    . Cholecalciferol (VITAMIN D-3) 5000 UNITS TABS Take 5,000 Units by mouth daily.      . Cholestyramine  Light (PREVALITE PO) Take 4 g by mouth as needed.    . conjugated estrogens (PREMARIN) vaginal cream Place 1 Applicatorful vaginally 2 (two) times a week. 1/2 to 1g    . escitalopram (LEXAPRO) 10 MG tablet Take 1 tablet (10 mg total) by mouth daily. 30 tablet 2  . Multiple Vitamins-Minerals (CENTRUM SILVER PO) Take 1 capsule by mouth daily.    . nilotinib (TASIGNA) 150 MG capsule Take 2 capsules (300 mg total) by mouth every 12 (twelve) hours. 120 capsule 0  . Probiotic Product (DIGESTIVE ADVANTAGE GUMMIES PO) Take 2  tablets by mouth daily.    . traMADol (ULTRAM) 50 MG tablet Take 1 tablet (50 mg total) by mouth every 6 (six) hours as needed. 30 tablet 0  . triamcinolone (NASACORT ALLERGY 24HR) 55 MCG/ACT AERO nasal inhaler Place 2 sprays into the nose as needed.    . vitamin B-12 (CYANOCOBALAMIN) 1000 MCG tablet Take 5,000 mcg by mouth daily.     Marland Kitchen ALPRAZolam (XANAX) 0.25 MG tablet Take 1 tablet (0.25 mg total) by mouth every 6 (six) hours as needed for anxiety. (Patient not taking: Reported on 01/08/2016) 30 tablet 1  . ondansetron (ZOFRAN) 8 MG tablet Take 1 tablet (8 mg total) by mouth every 8 (eight) hours as needed for nausea or vomiting. (Patient not taking: Reported on 01/16/2016) 30 tablet 2   No current facility-administered medications on file prior to visit.    Past Surgical History  Procedure Laterality Date  . Esophagogastroduodenoscopy  02/2010    gastric ulcers  . Partial hysterectomy      vaginal at age 73 years of age  . Nose surgery    . Lipoma excision  08/02/2011    left shoulder  . Colonoscopy  01/2004    DR [facility], few small tics  . Esophagogastroduodenoscopy  12/04/2012    [id number] gastritis (inflammation) was found in the gastric antrum; multiple biopsies The duodenal mucosa showed no abnormalities in the bulb and second portion of the duodenum  . Bravo ph study  12/04/2012    Procedure: BRAVO [place];  Surgeon: [name], MD;  [facility];  Service: Endoscopy;  Laterality: N/A;  . Toe debridement  1962    lt great toe  . Cholecystectomy N/A 01/03/2014    Procedure: LAPAROSCOPIC CHOLECYSTECTOMY WITH INTRAOPERATIVE CHOLANGIOGRAM;  Surgeon: Joyice Faster. Cornett, MD;  Location: [place];  Service: General;  Laterality: N/A;    Denies any headaches, dizziness, double vision, fevers, chills, night sweats, nausea, vomiting, diarrhea, constipation, chest pain, heart palpitations, shortness of breath, blood in stool, black tarry stool, urinary pain,  urinary burning, urinary frequency, hematuria.   PHYSICAL EXAMINATION  ECOG PERFORMANCE STATUS: 0 - Asymptomatic  Filed Vitals:   01/16/16 1259  BP: 144/60  Pulse: 84  Temp: 98.2 F (36.8 C)  Resp: 18    GENERAL:alert, no distress, well nourished, well developed, comfortable, cooperative, smiling and accompanied by her husband SKIN: skin color, texture, turgor are normal, no rashes or significant lesions HEAD: Normocephalic, No masses, lesions, tenderness or abnormalities EYES: normal, PERRLA, EOMI, Conjunctiva are pink and non-injected EARS: External ears normal OROPHARYNX:no exudate, no erythema, lips, buccal mucosa, and tongue normal and mucous membranes are moist  NECK: supple, no adenopathy, trachea midline LYMPH:  no palpable lymphadenopathy BREAST:not examined LUNGS: clear to auscultation and percussion HEART: regular rate & rhythm, no murmurs, no gallops, S1 normal and S2 normal ABDOMEN:abdomen soft, non-tender, obese, normal bowel sounds and no  masses or organomegaly BACK: Back symmetric, no curvature., No CVA tenderness EXTREMITIES:less then 2 second capillary refill, no joint deformities, effusion, or inflammation, no skin discoloration, no clubbing, no cyanosis  NEURO: alert & oriented x 3 with fluent speech, no focal motor/sensory deficits, gait normal   LABORATORY DATA: CBC    Component Value Date/Time   WBC 24.2* 01/16/2016 1227   RBC 2.49* 01/16/2016 1227   HGB 8.7* 01/16/2016 1227   HCT 27.4* 01/16/2016 1227   PLT 279 01/16/2016 1227   MCV 110.0* 01/16/2016 1227   MCH 34.9* 01/16/2016 1227   MCHC 31.8 01/16/2016 1227   RDW 18.3* 01/16/2016 1227   LYMPHSABS 1.6 01/16/2016 1227   MONOABS 1.8* 01/16/2016 1227   EOSABS 0.1 01/16/2016 1227   BASOSABS 0.1 01/16/2016 1227      Chemistry      Component Value Date/Time   NA 140 01/16/2016 1227   K 4.5 01/16/2016 1227   CL 106 01/16/2016 1227   CO2 26 01/16/2016 1227   BUN 22* 01/16/2016 1227    CREATININE 0.76 01/16/2016 1227   CREATININE 0.87 07/14/2011 0938      Component Value Date/Time   CALCIUM 9.2 01/16/2016 1227   ALKPHOS 139* 01/16/2016 1227   AST 29 01/16/2016 1227   ALT 32 01/16/2016 1227   BILITOT 0.9 01/16/2016 1227        PENDING LABS:   RADIOGRAPHIC STUDIES:  No results found.   PATHOLOGY:    ASSESSMENT AND PLAN:  Chronic myelogenous leukemia (CML), BCR-ABL1-positive (HCC) CML, BCR/ABL positive ([place] +).  Currently on Tasigna 150 mg PO BID beginning on 01/02/2016 with an excellent response thus far.  Oncology history updated.  I personally reviewed and went over laboratory results with the patient.  The results are noted within this dictation.  She is excited to learn of her lab work today and her response to therapy in such a short amount of time.  EKG today.  EKG today demonstrates a QT/QTc of 364/419 compared to a QTc of 440 ms 2 weeks ago.  Will need to continue to follow cloesly while on Tasigna and Lexapro as they both can increase QTc interval.  Weekly labs: CBC diff, CMET, LDH, Mg, Phosphorus  EKG in 2 weeks  Return in 2 weeks for follow-up.  She has a scheduled follow-up appointment with Dr. Florene Glen at the end of March 2017.  We've discussed the timing of her medications.  She takes her Tasigna at 10 AM and 10 PM.  This schedule work very well for her.  She takes her Lexapro at 7 PM, but reports falling asleep 30 minutes afterwards and having a difficult time remaining awake for the rest of the evening.  I have reviewed the interaction between [place].  There are no immediate interactions, other than the known QTc prolongation.  As a result, she will take her Lexapro at approximately 11 PM which is closer to her bed time.      THERAPY PLAN:  Continue Tasigna as prescribed.  All questions were answered. The patient knows to call the clinic with any problems, questions or concerns. We can certainly see the patient much  sooner if necessary.  Patient and plan discussed with Dr. Ancil Linsey and she is in agreement with the aforementioned.   This note is electronically signed by: Robynn Pane, PA-C 01/16/2016 6:18 PM

## 2016-01-19 ENCOUNTER — Telehealth (HOSPITAL_COMMUNITY): Payer: Self-pay | Admitting: *Deleted

## 2016-01-19 NOTE — Telephone Encounter (Signed)
Patient notified of below message Continue with Delsym for cough suppression for now. Add an expectorant such as guaifenesin. Guaifenesin does not interact with Tasigna

## 2016-01-19 NOTE — Telephone Encounter (Signed)
Continue with Delsym for cough suppression for now.  Add an expectorant such as guaifenesin.  Guaifenesin does not interact with Andrea Hippo, PA-C

## 2016-01-19 NOTE — Telephone Encounter (Signed)
Patient has been using Delsyum. It worked for her 2-3 weeks ago but now is "not doing a thing" for her cough. Occasionally brings up clear phlegm, but she mostly feels like it is something in her throat/chest that she can't get up. She is drinking plenty of water she states.  Can we give her something else for cough?

## 2016-01-20 ENCOUNTER — Telehealth (HOSPITAL_COMMUNITY): Payer: Self-pay | Admitting: *Deleted

## 2016-01-20 NOTE — Telephone Encounter (Signed)
I contacted [employer] regarding pt's bank statements being faxed in. The rep said they did receive those bank statements and that she would put in a notice to the patient assistance side to let them know that that documentation was there. She said it could be 24-48 hours to hear something back from them or potentially longer.

## 2016-01-22 ENCOUNTER — Other Ambulatory Visit (HOSPITAL_COMMUNITY): Payer: Self-pay | Admitting: Oncology

## 2016-01-22 DIAGNOSIS — J069 Acute upper respiratory infection, unspecified: Secondary | ICD-10-CM

## 2016-01-22 DIAGNOSIS — C9210 Chronic myeloid leukemia, BCR/ABL-positive, not having achieved remission: Secondary | ICD-10-CM

## 2016-01-22 MED ORDER — AMOXICILLIN-POT CLAVULANATE 875-125 MG PO TABS: 1.0000 | ORAL_TABLET | Freq: Two times a day (BID) | ORAL | Status: DC

## 2016-01-23 ENCOUNTER — Encounter (HOSPITAL_COMMUNITY): Payer: Medicare Other

## 2016-01-23 DIAGNOSIS — D72829 Elevated white blood cell count, unspecified: Secondary | ICD-10-CM | POA: Diagnosis not present

## 2016-01-23 DIAGNOSIS — C9210 Chronic myeloid leukemia, BCR/ABL-positive, not having achieved remission: Secondary | ICD-10-CM | POA: Diagnosis not present

## 2016-01-23 LAB — CBC WITH DIFFERENTIAL/PLATELET
BASOS ABS: 0 10*3/uL (ref 0.0–0.1)
BASOS PCT: 0 %
Eosinophils Absolute: 0.3 10*3/uL (ref 0.0–0.7)
Eosinophils Relative: 4 %
HEMATOCRIT: 27 % — AB (ref 36.0–46.0)
HEMOGLOBIN: 8.6 g/dL — AB (ref 12.0–15.0)
Lymphocytes Relative: 12 %
Lymphs Abs: 1 10*3/uL (ref 0.7–4.0)
MCH: 34.4 pg — ABNORMAL HIGH (ref 26.0–34.0)
MCHC: 31.9 g/dL (ref 30.0–36.0)
MCV: 108 fL — ABNORMAL HIGH (ref 78.0–100.0)
Monocytes Absolute: 0.9 10*3/uL (ref 0.1–1.0)
Monocytes Relative: 11 %
NEUTROS ABS: 6.2 10*3/uL (ref 1.7–7.7)
NEUTROS PCT: 73 %
Platelets: 229 10*3/uL (ref 150–400)
RBC: 2.5 MIL/uL — AB (ref 3.87–5.11)
RDW: 16.6 % — ABNORMAL HIGH (ref 11.5–15.5)
WBC: 8.5 10*3/uL (ref 4.0–10.5)

## 2016-01-23 LAB — COMPREHENSIVE METABOLIC PANEL
ALBUMIN: 4 g/dL (ref 3.5–5.0)
ALK PHOS: 132 U/L — AB (ref 38–126)
ALT: 20 U/L (ref 14–54)
ANION GAP: 9 (ref 5–15)
AST: 22 U/L (ref 15–41)
BILIRUBIN TOTAL: 0.7 mg/dL (ref 0.3–1.2)
BUN: 18 mg/dL (ref 6–20)
CALCIUM: 8.9 mg/dL (ref 8.9–10.3)
CO2: 25 mmol/L (ref 22–32)
Chloride: 106 mmol/L (ref 101–111)
Creatinine, Ser: 0.72 mg/dL (ref 0.44–1.00)
GFR calc non Af Amer: >60 mL/min (ref >60)
GLUCOSE: 111 mg/dL — AB (ref 65–99)
Potassium: 3.9 mmol/L (ref 3.5–5.1)
Sodium: 140 mmol/L (ref 135–145)
TOTAL PROTEIN: 7.1 g/dL (ref 6.5–8.1)

## 2016-01-23 LAB — PHOSPHORUS: PHOSPHORUS: 3.7 mg/dL (ref 2.5–4.6)

## 2016-01-23 LAB — MAGNESIUM: Magnesium: 2.1 mg/dL (ref 1.7–2.4)

## 2016-01-23 LAB — LACTATE DEHYDROGENASE: LDH: 163 U/L (ref 98–192)

## 2016-01-26 ENCOUNTER — Other Ambulatory Visit (HOSPITAL_COMMUNITY): Payer: Self-pay | Admitting: *Deleted

## 2016-01-26 DIAGNOSIS — C9210 Chronic myeloid leukemia, BCR/ABL-positive, not having achieved remission: Secondary | ICD-10-CM

## 2016-01-26 MED ORDER — TRIAMCINOLONE ACETONIDE 0.1 % EX CREA: 1.0000 "application " | TOPICAL_CREAM | Freq: Two times a day (BID) | CUTANEOUS | Status: DC

## 2016-01-29 ENCOUNTER — Encounter (HOSPITAL_COMMUNITY): Payer: Self-pay | Admitting: Oncology

## 2016-01-29 ENCOUNTER — Encounter (HOSPITAL_BASED_OUTPATIENT_CLINIC_OR_DEPARTMENT_OTHER): Payer: Medicare Other | Admitting: Oncology

## 2016-01-29 ENCOUNTER — Other Ambulatory Visit: Payer: Self-pay

## 2016-01-29 ENCOUNTER — Encounter (HOSPITAL_COMMUNITY): Payer: Medicare Other

## 2016-01-29 VITALS — BP 144/63 | HR 85 | Temp 98.2°F | Resp 18 | Wt 206.0 lb

## 2016-01-29 DIAGNOSIS — C9210 Chronic myeloid leukemia, BCR/ABL-positive, not having achieved remission: Secondary | ICD-10-CM

## 2016-01-29 DIAGNOSIS — R059 Cough, unspecified: Secondary | ICD-10-CM

## 2016-01-29 DIAGNOSIS — R05 Cough: Secondary | ICD-10-CM

## 2016-01-29 DIAGNOSIS — D72829 Elevated white blood cell count, unspecified: Secondary | ICD-10-CM | POA: Diagnosis not present

## 2016-01-29 DIAGNOSIS — D649 Anemia, unspecified: Secondary | ICD-10-CM

## 2016-01-29 DIAGNOSIS — J069 Acute upper respiratory infection, unspecified: Secondary | ICD-10-CM

## 2016-01-29 LAB — CBC WITH DIFFERENTIAL/PLATELET
BASOS PCT: 0 %
Basophils Absolute: 0 10*3/uL (ref 0.0–0.1)
EOS ABS: 0.1 10*3/uL (ref 0.0–0.7)
Eosinophils Relative: 2 %
HEMATOCRIT: 24.9 % — AB (ref 36.0–46.0)
HEMOGLOBIN: 8.1 g/dL — AB (ref 12.0–15.0)
LYMPHS ABS: 0.8 10*3/uL (ref 0.7–4.0)
Lymphocytes Relative: 14 %
MCH: 34.2 pg — AB (ref 26.0–34.0)
MCHC: 32.5 g/dL (ref 30.0–36.0)
MCV: 105.1 fL — AB (ref 78.0–100.0)
MONO ABS: 0.6 10*3/uL (ref 0.1–1.0)
MONOS PCT: 10 %
NEUTROS ABS: 4.3 10*3/uL (ref 1.7–7.7)
NEUTROS PCT: 74 %
Platelets: 219 10*3/uL (ref 150–400)
RBC: 2.37 MIL/uL — ABNORMAL LOW (ref 3.87–5.11)
RDW: 16.3 % — ABNORMAL HIGH (ref 11.5–15.5)
WBC: 5.7 10*3/uL (ref 4.0–10.5)

## 2016-01-29 LAB — COMPREHENSIVE METABOLIC PANEL
ALK PHOS: 127 U/L — AB (ref 38–126)
ALT: 22 U/L (ref 14–54)
ANION GAP: 7 (ref 5–15)
AST: 28 U/L (ref 15–41)
Albumin: 3.8 g/dL (ref 3.5–5.0)
BILIRUBIN TOTAL: 0.8 mg/dL (ref 0.3–1.2)
BUN: 18 mg/dL (ref 6–20)
CALCIUM: 8.8 mg/dL — AB (ref 8.9–10.3)
CO2: 26 mmol/L (ref 22–32)
Chloride: 105 mmol/L (ref 101–111)
Creatinine, Ser: 0.74 mg/dL (ref 0.44–1.00)
GFR calc non Af Amer: >60 mL/min (ref >60)
Glucose, Bld: 119 mg/dL — ABNORMAL HIGH (ref 65–99)
Potassium: 4.1 mmol/L (ref 3.5–5.1)
SODIUM: 138 mmol/L (ref 135–145)
TOTAL PROTEIN: 6.7 g/dL (ref 6.5–8.1)

## 2016-01-29 LAB — PHOSPHORUS: Phosphorus: 3.4 mg/dL (ref 2.5–4.6)

## 2016-01-29 LAB — LACTATE DEHYDROGENASE: LDH: 153 U/L (ref 98–192)

## 2016-01-29 LAB — MAGNESIUM: MAGNESIUM: 2.1 mg/dL (ref 1.7–2.4)

## 2016-01-29 MED ORDER — AZITHROMYCIN 250 MG PO TABS: ORAL_TABLET | ORAL | Status: DC

## 2016-01-29 MED ORDER — BENZONATATE 200 MG PO CAPS: 200.0000 mg | ORAL_CAPSULE | Freq: Three times a day (TID) | ORAL | Status: DC | PRN

## 2016-01-29 NOTE — Progress Notes (Signed)
Andrea Bogus, MD [address] [place] [address]  Chronic myeloid leukemia, BCR/ABL-positive, not having achieved remission ([place]) - Plan: CBC with Differential, Comprehensive metabolic panel, Lactate dehydrogenase, Magnesium, Phosphorus, BCR-ABL1, CML/ALL, PCR, QUANT  Anemia, unspecified anemia type - Plan: Vitamin B12, Folate, Iron and TIBC, Ferritin, Reticulocytes, Occult blood card to lab, stool, Occult blood card to lab, stool, Occult blood card to lab, stool  URI (upper respiratory infection) - Plan: azithromycin (ZITHROMAX) 250 MG tablet  Cough - Plan: benzonatate (TESSALON) 200 MG capsule  CURRENT THERAPY: Tasigna 150 mg PO BID beginning on 01/02/2016.  INTERVAL HISTORY: Andrea Martinez 70 y.o. female returns for followup of CML, [place] +.    Chronic myelogenous leukemia (CML), BCR-ABL1-positive ([place])   11/21/2015 Initial Diagnosis Chronic myelogenous leukemia (CML), BCR-ABL1-positive ([place])   11/21/2015 Miscellaneous Seen by Dr. Florene Glen [facility]   11/21/2015 -  Chemotherapy Hydrea 1000 mg and Allopurinol 300 mg daily (due to high uric acid level)   11/21/2015 Tumor Marker BCR P210 Ratio of Fusion to Control, Blood 0.8941    P210 IS % Ratio, Blood 85.834%     01/02/2016 -  Chemotherapy Tasigna   01/08/2016 Miscellaneous EKG- QT/QTc: 382/440 ms   01/16/2016 Miscellaneous EKG- QT/QTc: 364/419    I personally reviewed and went over laboratory results with the patient.  The results are noted within this dictation.  She is excited to learn of her lab results today.  I personally reviewed and went over radiographic studies with the patient.  The results are noted within this dictation.  Mammogram on 12/09/2015 was BIRADS 1.  She continues to have a non-productive cough that keeps her awake at night.  She has been very compliant with OTC anti-tussives without any major improvement.  She has an allergy to codeine.    She reports nasal discharge that is  yellow in color and on one instance when she cleared her nose, she noted some minor blood in the yellow phlegm.  She recently completed an RX of Augmentin.    Her weight is stable.  Her weight is up 6 lbs compared to her last weight check.  She called the other day with a leg rash.  She was prescribed Triamcinolone cream and the patient reports that the rash is much improved.  It persists and is decribed below in the physical exam section.  She notes LE edema.  She has non-pitting edema in her lower legs, but she does have some minor pitting edema on the dorsal aspect of her feet. I do not think it is significant enough to intervene with intense measures at this time.  Tasigna has < or equal 15% incidence of peripheral edema.   Past Medical History  Diagnosis Date  . Gastric ulcer 2011    EGD, 5/11  . Hypothyroidism     not on meds, followed by Dr. Elyse Hsu  . PONV (postoperative nausea and vomiting)   . Lipoma     left upper arm  . UTI (lower urinary tract infection)     frequent  . Leukocytosis 11/20/2015  . Chronic myelogenous leukemia (CML), BCR-ABL1-positive ([place]) 11/20/2015    has UNSPECIFIED DISORDER OF THYROID; PUD; NAUSEA; ABDOMINAL PAIN RIGHT UPPER QUADRANT; UTI'S, HX OF; Mass of arm; Lipoma; Lipoma of arm; Colon cancer screening; Rib pain; Dyspepsia; Biliary dyskinesia; and Chronic myelogenous leukemia (CML), BCR-ABL1-positive ([place]) on her problem list.     is allergic to codeine; esomeprazole magnesium; sulfonamide derivatives; and  fluconazole.  Current Outpatient Prescriptions on File Prior to Visit  Medication Sig Dispense Refill  . acetaminophen (TYLENOL) 500 MG tablet Take 500 mg by mouth every 6 (six) hours as needed.    Marland Kitchen allopurinol (ZYLOPRIM) 300 MG tablet Take 1 tablet by mouth daily.  2  . ALPRAZolam (XANAX) 0.25 MG tablet Take 1 tablet (0.25 mg total) by mouth every 6 (six) hours as needed for anxiety. 30 tablet 1  . cetirizine (ZYRTEC) 10 MG tablet Take 10 mg  by mouth as needed for allergies.    . Cholecalciferol (VITAMIN D-3) 5000 UNITS TABS Take 5,000 Units by mouth daily.      . Cholestyramine Light (PREVALITE PO) Take 4 g by mouth as needed.    . conjugated estrogens (PREMARIN) vaginal cream Place 1 Applicatorful vaginally 2 (two) times a week. 1/2 to 1g    . escitalopram (LEXAPRO) 10 MG tablet Take 1 tablet (10 mg total) by mouth daily. 30 tablet 2  . Multiple Vitamins-Minerals (CENTRUM SILVER PO) Take 1 capsule by mouth daily.    . nilotinib (TASIGNA) 150 MG capsule Take 2 capsules (300 mg total) by mouth every 12 (twelve) hours. 120 capsule 0  . ondansetron (ZOFRAN) 8 MG tablet Take 1 tablet (8 mg total) by mouth every 8 (eight) hours as needed for nausea or vomiting. 30 tablet 2  . Probiotic Product (DIGESTIVE ADVANTAGE GUMMIES PO) Take 2 tablets by mouth daily.    . traMADol (ULTRAM) 50 MG tablet Take 1 tablet (50 mg total) by mouth every 6 (six) hours as needed. 30 tablet 0  . triamcinolone (NASACORT ALLERGY 24HR) 55 MCG/ACT AERO nasal inhaler Place 2 sprays into the nose as needed.    . triamcinolone cream (KENALOG) 0.1 % Apply 1 application topically 2 (two) times daily. 453.6 g 0  . vitamin B-12 (CYANOCOBALAMIN) 1000 MCG tablet Take 5,000 mcg by mouth daily.      No current facility-administered medications on file prior to visit.    Past Surgical History  Procedure Laterality Date  . Esophagogastroduodenoscopy  02/2010    gastric ulcers  . Partial hysterectomy      vaginal at age 72 years of age  . Nose surgery    . Lipoma excision  08/02/2011    left shoulder  . Colonoscopy  01/2004    [facility], few small tics  . Esophagogastroduodenoscopy  12/04/2012    [id number] gastritis (inflammation) was found in the gastric antrum; multiple biopsies The duodenal mucosa showed no abnormalities in the bulb and second portion of the duodenum  . Bravo ph study  12/04/2012    Procedure: BRAVO [place];  Surgeon: [name], MD;   [facility];  Service: Endoscopy;  Laterality: N/A;  . Toe debridement  1962    lt great toe  . Cholecystectomy N/A 01/03/2014    Procedure: LAPAROSCOPIC CHOLECYSTECTOMY WITH INTRAOPERATIVE CHOLANGIOGRAM;  Surgeon: Joyice Faster. Cornett, MD;  Location: [facility];  Service: General;  Laterality: N/A;    Denies any headaches, dizziness, double vision, fevers, chills, night sweats, nausea, vomiting, diarrhea, constipation, chest pain, heart palpitations, shortness of breath, blood in stool, black tarry stool, urinary pain, urinary burning, urinary frequency, hematuria.   PHYSICAL EXAMINATION  ECOG PERFORMANCE STATUS: 0 - Asymptomatic  Filed Vitals:   01/29/16 1445  BP: 144/63  Pulse: 85  Temp: 98.2 F (36.8 C)  Resp: 18    GENERAL:alert, no distress, well nourished, well developed, comfortable, cooperative, smiling and  accompanied by her husband SKIN: skin color, texture, turgor are normal, pale, B/L LE rash, macular and raised, blanchable with palpation, mildly erythematous, and localized to a 8-10 cm area that is symmetric B/L.  Mulktiple ecchymoses on lower aspects of upper extremities, and lower aspects of lower extremities. HEAD: Normocephalic, No masses, lesions, tenderness or abnormalities EYES: normal, PERRLA, EOMI, Conjunctiva are pink and non-injected EARS: External ears normal NARES: B/L inferior turbinate edema OROPHARYNX: posterior pharynx erythema noted with some edema. NECK: supple, no adenopathy, trachea midline LYMPH:  no palpable lymphadenopathy BREAST:not examined LUNGS: clear to auscultation and percussion HEART: regular rate & rhythm, no murmurs, no gallops, S1 normal and S2 normal ABDOMEN:abdomen soft, non-tender, obese, normal bowel sounds and no masses or organomegaly BACK: Back symmetric, no curvature., No CVA tenderness EXTREMITIES:less then 2 second capillary refill, no joint deformities, effusion, or inflammation, no skin  discoloration, no clubbing, no cyanosis  NEURO: alert & oriented x 3 with fluent speech, no focal motor/sensory deficits, gait normal   LABORATORY DATA: CBC    Component Value Date/Time   WBC 5.7 01/29/2016 1327   RBC 2.37* 01/29/2016 1327   HGB 8.1* 01/29/2016 1327   HCT 24.9* 01/29/2016 1327   PLT 219 01/29/2016 1327   MCV 105.1* 01/29/2016 1327   MCH 34.2* 01/29/2016 1327   MCHC 32.5 01/29/2016 1327   RDW 16.3* 01/29/2016 1327   LYMPHSABS 0.8 01/29/2016 1327   MONOABS 0.6 01/29/2016 1327   EOSABS 0.1 01/29/2016 1327   BASOSABS 0.0 01/29/2016 1327      Chemistry      Component Value Date/Time   NA 138 01/29/2016 1327   K 4.1 01/29/2016 1327   CL 105 01/29/2016 1327   CO2 26 01/29/2016 1327   BUN 18 01/29/2016 1327   CREATININE 0.74 01/29/2016 1327   CREATININE 0.87 07/14/2011 0938      Component Value Date/Time   CALCIUM 8.8* 01/29/2016 1327   ALKPHOS 127* 01/29/2016 1327   AST 28 01/29/2016 1327   ALT 22 01/29/2016 1327   BILITOT 0.8 01/29/2016 1327        PENDING LABS:   RADIOGRAPHIC STUDIES:  No results found.   PATHOLOGY:    ASSESSMENT AND PLAN:  Chronic myelogenous leukemia (CML), BCR-ABL1-positive (HCC) CML, BCR/ABL positive ([place] +).  Currently on Tasigna 150 mg PO BID beginning on 01/02/2016 with an excellent response thus far.  Oncology history is up-to-date.  I personally reviewed and went over laboratory results with the patient.  The results are noted within this dictation.  She is excited to learn of her lab work today.  She is concerned about her progressive anemia.  I will work this up peripherally.  She continues with a non-productive cough.  Of note, this cough PRE-DATES the start of Tasigna.  It was noted in December 2016.  This cough keeps her up at night.  She has tried multiple OTC remedies without success.  She does not have any codeine/codone based-pain medication at home, and actually has a listed allergy of codeine,  thus I will avoid Hycodan.  I have escribed tessalon for her cough and she is advised to favor taking this cough suppressant at bedtime in particular.  No interactions between Tasigna and Lavella Lemons are reported.  Additionally, she notes yellow nasal discharge when she blows her nose.  She denies any fevers.  She is using an OTC normal saline spray and allergy medication.  She is S/P augmentin which she just finished.  Due to continues  symptoms of URI, I will give a 10 day course of Z-Pak to cover atypicals.    EKG today.  EKG today demonstrates a QT/QTc of 386/416 ms, two weeks ago it was 364/419, and four weeks ago the QTc was 440 ms.  Will need to continue to follow cloesly while on Tasigna and Lexapro as they both can increase QTc interval.   She notes a rash.  This is a hyperkeratotic minimal erythematous macular rash that was raised, but blanchable.  Much improved (patient reports) on triamcinolone.  Rash is located and localized to the anterior aspect of lower extremity.   In addition to her rash, she has nonpitting LE edema in LE, with trace-1+ pitting pedal edema.  She is encouraged to elevate her LE when resting.  She notes "abnormal" ecchymoses that are located on lower aspects of lower extremities, lower aspect of upper extremities as well.  She does not have any bruising on her back or chest.  Weekly labs: CBC diff, CMET, LDH, Mg, Phosphorus  Additional labs next week: Anemia panel, retic count, and stool cards x 3.  She is to be sent home with stool cards today.  Return in 2 weeks for follow-up.  If all is well, it would be reasonable to space out her lab appointments and office visits some; Leith is looking forward to that.  She has a scheduled follow-up appointment with Dr. Florene Glen at the end of March 2017.  THERAPY PLAN:  Continue Tasigna as prescribed.She will be due for BCR/ABL testing ~ 04/01/16.  All questions were answered. The patient knows to call the clinic with any problems,  questions or concerns. We can certainly see the patient much sooner if necessary.  Patient and plan discussed with Dr. Ancil Linsey and she is in agreement with the aforementioned.   This note is electronically signed by: Doy Mince 01/29/2016 7:53 PM

## 2016-01-29 NOTE — Assessment & Plan Note (Addendum)
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  Currently on Tasigna 150 mg PO BID beginning on 01/02/2016 with an excellent response thus far.  Oncology history is up-to-date.  I personally reviewed and went over laboratory results with the patient.  The results are noted within this dictation.  She is excited to learn of her lab work today.  She is concerned about her progressive anemia.  I will work this up peripherally.  She continues with a non-productive cough.  Of note, this cough PRE-DATES the start of Tasigna.  It was noted in December 2016.  This cough keeps her up at night.  She has tried multiple OTC remedies without success.  She does not have any codeine/codone based-pain medication at home, and actually has a listed allergy of codeine, thus I will avoid Hycodan.  I have escribed tessalon for her cough and she is advised to favor taking this cough suppressant at bedtime in particular.  No interactions between Tasigna and Lavella Lemons are reported.  Additionally, she notes yellow nasal discharge when she blows her nose.  She denies any fevers.  She is using an OTC normal saline spray and allergy medication.  She is S/P augmentin which she just finished.  Due to continues symptoms of URI, I will give a 10 day course of Z-Pak to cover atypicals.    EKG today.  EKG today demonstrates a QT/QTc of 386/416 ms, two weeks ago it was 364/419, and four weeks ago the QTc was 440 ms.  Will need to continue to follow cloesly while on Tasigna and Lexapro as they both can increase QTc interval.   She notes a rash.  This is a hyperkeratotic minimal erythematous macular rash that was raised, but blanchable.  Much improved (patient reports) on triamcinolone.  Rash is located and localized to the anterior aspect of lower extremity.   In addition to her rash, she has nonpitting LE edema in LE, with trace-1+ pitting pedal edema.  She is encouraged to elevate her LE when resting.  She notes "abnormal" ecchymoses that are  located on lower aspects of lower extremities, lower aspect of upper extremities as well.  She does not have any bruising on her back or chest.  Weekly labs: CBC diff, CMET, LDH, Mg, Phosphorus  Additional labs next week: Anemia panel, retic count, and stool cards x 3.  She is to be sent home with stool cards today.  Return in 2 weeks for follow-up.  If all is well, it would be reasonable to space out her lab appointments and office visits some; Lin is looking forward to that.  She has a scheduled follow-up appointment with Dr. Florene Glen at the end of March 2017.

## 2016-01-29 NOTE — Patient Instructions (Addendum)
[place] at [facility] Discharge Instructions  RECOMMENDATIONS MADE BY THE CONSULTANT AND ANY TEST RESULTS WILL BE SENT TO YOUR REFERRING PHYSICIAN.  Exam and discussion today with Andrea Crigler, PA. EKG today. Lab work next weekly as scheduled. Prescriptions for Z-pak and Tessalon perles sent to your pharmacy. Office visit in 2 weeks as scheduled.    Thank you for choosing [place] at [facility] to provide your oncology and hematology care.  To afford each patient quality time with our provider, please arrive at least 15 minutes before your scheduled appointment time.   Beginning January 23rd 2017 lab work for the [facility] will be done in the  Main lab at [facility] on 1st floor. If you have a lab appointment with the [facility] please come in thru the  Main Entrance and check in at the main information desk  You need to re-schedule your appointment should you arrive 10 or more minutes late.  We strive to give you quality time with our providers, and arriving late affects you and other patients whose appointments are after yours.  Also, if you no show three or more times for appointments you may be dismissed from the clinic at the providers discretion.     Again, thank you for choosing [facility].  Our hope is that these requests will decrease the amount of time that you wait before being seen by our physicians.       _____________________________________________________________  Should you have questions after your visit to [facility], please contact our office at [phone] between the hours of 8:30 a.m. and 4:30 p.m.  Voicemails left after 4:30 p.m. will not be returned until the following business day.  For prescription refill requests, have your pharmacy contact our office.   Stool for Occult Blood Test WHY AM I HAVING THIS TEST? Stool for occult blood, or fecal occult blood test (FOBT), is a  test that is used to screen for gastrointestinal (GI) bleeding, which may be an indicator of colon cancer. This test can also detect small amounts of blood in your stool (feces) from other causes, such as ulcers or hemorrhoids. This test is usually done as part of an annual routine examination after age 96. WHAT KIND OF SAMPLE IS TAKEN? A sample of your stool is required for this test. Your health care provider may collect the sample with a swab of the rectum. Or, you may be instructed to collect the sample in a container at home. If you are instructed to collect the sample, your health care provider will provide you with the instructions and the supplies that you will need to do that. WILL I NEED TO COLLECT SAMPLES AT HOME?  A stool sample may need to be collected at home. When collecting a sample at home, make sure that you:  Use the sterile containers and other supplies that were given to you from the lab.  Do not mix urine, toilet paper, or water with your sample.  Label all slides and containers with your name and the date when you collected the sample. Your health care provider or lab staff will give you one or more test "cards." You will collect a separate sample from three different stools, usually on different days that follow each other. Follow these steps for each sample: 1. Collect a stool sample into a clean container. 2. With an applicator stick, apply a thin smear of stool  sample onto each filter paper square or window that is on the card. 3. Allow the filter paper to dry. After it is dry, the sample will be stable. Usually, you will return all of the samples to your health care provider or lab at the same time.  HOW DO I PREPARE FOR THE TEST?  Do not eat any red meat within three days before testing.  Follow your health care provider's instructions about eating and drinking prior to the test. Your health care provider may instruct you to avoid other foods or substances.  Ask  your health care provider about taking or not taking your medicines prior to the test. You may be instructed to avoid certain medicines that are known to interfere with this test. HOW ARE THE TEST RESULTS REPORTED? Your test results will be reported as either positive or negative. It is your responsibility to obtain your test results. Ask the lab or department performing the test when and how you will get your results. WHAT DO THE RESULTS MEAN? A negative test result means that there is no occult blood within the stool. A negative result is normal. A positive test result may mean that there is blood in the stool. Causes of blood in the stool include:  GI tumors.  Certain GI diseases.  GI trauma or recent surgery.  Hemorrhoids. Talk with your health care provider to discuss your results, treatment options, and if necessary, the need for more tests. Talk with your health care provider if you have any questions about your results.   This information is not intended to replace advice given to you by your health care provider. Make sure you discuss any questions you have with your health care provider.   Document Released: 12/24/2004 Document Revised: 12/20/2014 Document Reviewed: 04/26/2014 Elsevier Interactive Patient Education [employer].

## 2016-02-06 ENCOUNTER — Telehealth (HOSPITAL_COMMUNITY): Payer: Self-pay | Admitting: *Deleted

## 2016-02-06 ENCOUNTER — Encounter (HOSPITAL_COMMUNITY): Payer: Medicare Other

## 2016-02-06 DIAGNOSIS — D649 Anemia, unspecified: Secondary | ICD-10-CM

## 2016-02-06 DIAGNOSIS — C9210 Chronic myeloid leukemia, BCR/ABL-positive, not having achieved remission: Secondary | ICD-10-CM

## 2016-02-06 DIAGNOSIS — D72829 Elevated white blood cell count, unspecified: Secondary | ICD-10-CM | POA: Diagnosis not present

## 2016-02-06 LAB — COMPREHENSIVE METABOLIC PANEL
ALBUMIN: 4.1 g/dL (ref 3.5–5.0)
ALT: 19 U/L (ref 14–54)
AST: 26 U/L (ref 15–41)
Alkaline Phosphatase: 120 U/L (ref 38–126)
Anion gap: 8 (ref 5–15)
BUN: 20 mg/dL (ref 6–20)
CHLORIDE: 105 mmol/L (ref 101–111)
CO2: 25 mmol/L (ref 22–32)
Calcium: 8.7 mg/dL — ABNORMAL LOW (ref 8.9–10.3)
Creatinine, Ser: 0.75 mg/dL (ref 0.44–1.00)
GFR calc Af Amer: >60 mL/min (ref >60)
GFR calc non Af Amer: >60 mL/min (ref >60)
GLUCOSE: 113 mg/dL — AB (ref 65–99)
POTASSIUM: 4.1 mmol/L (ref 3.5–5.1)
SODIUM: 138 mmol/L (ref 135–145)
Total Bilirubin: 0.6 mg/dL (ref 0.3–1.2)
Total Protein: 7 g/dL (ref 6.5–8.1)

## 2016-02-06 LAB — CBC WITH DIFFERENTIAL/PLATELET
BASOS ABS: 0 10*3/uL (ref 0.0–0.1)
Basophils Relative: 0 %
EOS PCT: 3 %
Eosinophils Absolute: 0.2 10*3/uL (ref 0.0–0.7)
HEMATOCRIT: 30.2 % — AB (ref 36.0–46.0)
Hemoglobin: 9.6 g/dL — ABNORMAL LOW (ref 12.0–15.0)
LYMPHS ABS: 1.1 10*3/uL (ref 0.7–4.0)
LYMPHS PCT: 19 %
MCH: 32.5 pg (ref 26.0–34.0)
MCHC: 31.8 g/dL (ref 30.0–36.0)
MCV: 102.4 fL — AB (ref 78.0–100.0)
Monocytes Absolute: 0.4 10*3/uL (ref 0.1–1.0)
Monocytes Relative: 7 %
NEUTROS ABS: 4.1 10*3/uL (ref 1.7–7.7)
Neutrophils Relative %: 71 %
PLATELETS: 296 10*3/uL (ref 150–400)
RBC: 2.95 MIL/uL — AB (ref 3.87–5.11)
RDW: 15.6 % — AB (ref 11.5–15.5)
WBC: 5.7 10*3/uL (ref 4.0–10.5)

## 2016-02-06 LAB — LACTATE DEHYDROGENASE: LDH: 136 U/L (ref 98–192)

## 2016-02-06 LAB — RETICULOCYTES
RBC.: 2.95 MIL/uL — ABNORMAL LOW (ref 3.87–5.11)
RETIC COUNT ABSOLUTE: 38.4 10*3/uL (ref 19.0–186.0)
RETIC CT PCT: 1.3 % (ref 0.4–3.1)

## 2016-02-06 LAB — FOLATE: FOLATE: 51.9 ng/mL (ref >5.9)

## 2016-02-06 LAB — PHOSPHORUS: Phosphorus: 3.7 mg/dL (ref 2.5–4.6)

## 2016-02-06 LAB — MAGNESIUM: Magnesium: 2.2 mg/dL (ref 1.7–2.4)

## 2016-02-06 NOTE — Telephone Encounter (Signed)
[facility] Pt [place] to contact pt regarding setting up delivery for [place]. Pt notified and pharmacy # given to patient. Pt said ok.

## 2016-02-07 LAB — FERRITIN: FERRITIN: 72 ng/mL (ref 11–307)

## 2016-02-07 LAB — IRON AND TIBC
Iron: 19 ug/dL — ABNORMAL LOW (ref 28–170)
Saturation Ratios: 6 % — ABNORMAL LOW (ref 10.4–31.8)
TIBC: 337 ug/dL (ref 250–450)
UIBC: 318 ug/dL

## 2016-02-07 LAB — VITAMIN B12: Vitamin B-12: 1937 pg/mL — ABNORMAL HIGH (ref 180–914)

## 2016-02-12 ENCOUNTER — Other Ambulatory Visit (HOSPITAL_COMMUNITY): Payer: Self-pay | Admitting: Oncology

## 2016-02-13 ENCOUNTER — Other Ambulatory Visit (HOSPITAL_COMMUNITY): Payer: Self-pay | Admitting: *Deleted

## 2016-02-13 ENCOUNTER — Telehealth (HOSPITAL_COMMUNITY): Payer: Self-pay | Admitting: *Deleted

## 2016-02-13 ENCOUNTER — Encounter (HOSPITAL_COMMUNITY): Payer: Medicare Other

## 2016-02-13 ENCOUNTER — Encounter (HOSPITAL_COMMUNITY): Payer: Self-pay | Admitting: Hematology & Oncology

## 2016-02-13 ENCOUNTER — Encounter (HOSPITAL_COMMUNITY): Payer: Medicare Other | Attending: Hematology & Oncology | Admitting: Hematology & Oncology

## 2016-02-13 ENCOUNTER — Other Ambulatory Visit: Payer: Self-pay

## 2016-02-13 VITALS — HR 67 | Temp 98.0°F | Resp 18 | Wt 198.0 lb

## 2016-02-13 DIAGNOSIS — Z9049 Acquired absence of other specified parts of digestive tract: Secondary | ICD-10-CM | POA: Insufficient documentation

## 2016-02-13 DIAGNOSIS — C9210 Chronic myeloid leukemia, BCR/ABL-positive, not having achieved remission: Secondary | ICD-10-CM

## 2016-02-13 DIAGNOSIS — D649 Anemia, unspecified: Secondary | ICD-10-CM | POA: Diagnosis not present

## 2016-02-13 DIAGNOSIS — R109 Unspecified abdominal pain: Secondary | ICD-10-CM | POA: Diagnosis not present

## 2016-02-13 DIAGNOSIS — Z79899 Other long term (current) drug therapy: Secondary | ICD-10-CM | POA: Diagnosis not present

## 2016-02-13 DIAGNOSIS — Z9889 Other specified postprocedural states: Secondary | ICD-10-CM | POA: Diagnosis not present

## 2016-02-13 DIAGNOSIS — Z888 Allergy status to other drugs, medicaments and biological substances status: Secondary | ICD-10-CM | POA: Diagnosis not present

## 2016-02-13 DIAGNOSIS — Z801 Family history of malignant neoplasm of trachea, bronchus and lung: Secondary | ICD-10-CM | POA: Diagnosis not present

## 2016-02-13 DIAGNOSIS — E039 Hypothyroidism, unspecified: Secondary | ICD-10-CM | POA: Diagnosis not present

## 2016-02-13 LAB — COMPREHENSIVE METABOLIC PANEL
ALK PHOS: 126 U/L (ref 38–126)
ALT: 26 U/L (ref 14–54)
ANION GAP: 7 (ref 5–15)
AST: 29 U/L (ref 15–41)
Albumin: 4.3 g/dL (ref 3.5–5.0)
BILIRUBIN TOTAL: 0.9 mg/dL (ref 0.3–1.2)
BUN: 15 mg/dL (ref 6–20)
CALCIUM: 9.1 mg/dL (ref 8.9–10.3)
CO2: 27 mmol/L (ref 22–32)
Chloride: 106 mmol/L (ref 101–111)
Creatinine, Ser: 0.67 mg/dL (ref 0.44–1.00)
Glucose, Bld: 108 mg/dL — ABNORMAL HIGH (ref 65–99)
Potassium: 4.3 mmol/L (ref 3.5–5.1)
Sodium: 140 mmol/L (ref 135–145)
TOTAL PROTEIN: 7.2 g/dL (ref 6.5–8.1)

## 2016-02-13 LAB — OCCULT BLOOD X 1 CARD TO LAB, STOOL
FECAL OCCULT BLD: NEGATIVE
Fecal Occult Bld: NEGATIVE
Fecal Occult Bld: NEGATIVE

## 2016-02-13 LAB — LACTATE DEHYDROGENASE: LDH: 141 U/L (ref 98–192)

## 2016-02-13 LAB — CBC WITH DIFFERENTIAL/PLATELET
Basophils Absolute: 0 10*3/uL (ref 0.0–0.1)
Basophils Relative: 0 %
EOS ABS: 0 10*3/uL (ref 0.0–0.7)
EOS PCT: 1 %
HCT: 32.4 % — ABNORMAL LOW (ref 36.0–46.0)
Hemoglobin: 10.3 g/dL — ABNORMAL LOW (ref 12.0–15.0)
LYMPHS ABS: 1 10*3/uL (ref 0.7–4.0)
Lymphocytes Relative: 17 %
MCH: 32.1 pg (ref 26.0–34.0)
MCHC: 31.8 g/dL (ref 30.0–36.0)
MCV: 100.9 fL — ABNORMAL HIGH (ref 78.0–100.0)
Monocytes Absolute: 0.3 10*3/uL (ref 0.1–1.0)
Monocytes Relative: 5 %
Neutro Abs: 4.4 10*3/uL (ref 1.7–7.7)
Neutrophils Relative %: 77 %
Platelets: 261 10*3/uL (ref 150–400)
RBC: 3.21 MIL/uL — AB (ref 3.87–5.11)
RDW: 15.6 % — AB (ref 11.5–15.5)
WBC: 5.7 10*3/uL (ref 4.0–10.5)

## 2016-02-13 LAB — MAGNESIUM: MAGNESIUM: 2.2 mg/dL (ref 1.7–2.4)

## 2016-02-13 LAB — PHOSPHORUS: PHOSPHORUS: 3.7 mg/dL (ref 2.5–4.6)

## 2016-02-13 MED ORDER — NILOTINIB HCL 150 MG PO CAPS: 300.0000 mg | ORAL_CAPSULE | Freq: Two times a day (BID) | ORAL | Status: DC

## 2016-02-13 NOTE — Patient Instructions (Addendum)
[place] at [facility] Discharge Instructions  RECOMMENDATIONS MADE BY THE CONSULTANT AND ANY TEST RESULTS WILL BE SENT TO YOUR REFERRING PHYSICIAN.   Exam and discussion by Dr Whitney Muse today EKG today Lab work looks good today, copy given. You can stop taking the allopurinol (because your white blood cell count is normal) You can take melatonin (its over the counter) 6-10mg  to see if that helps with your sleep  You can take your tasigna a little sooner than 12 hours  Labs every 2 weeks  Return to see the doctor in 6 weeks Please call the clinic if you have any questions or concerns       Thank you for choosing [place] at [facility] to provide your oncology and hematology care.  To afford each patient quality time with our provider, please arrive at least 15 minutes before your scheduled appointment time.   Beginning January 23rd 2017 lab work for the [facility] will be done in the  Main lab at [facility] on 1st floor. If you have a lab appointment with the [facility] please come in thru the  Main Entrance and check in at the main information desk  You need to re-schedule your appointment should you arrive 10 or more minutes late.  We strive to give you quality time with our providers, and arriving late affects you and other patients whose appointments are after yours.  Also, if you no show three or more times for appointments you may be dismissed from the clinic at the providers discretion.     Again, thank you for choosing [facility].  Our hope is that these requests will decrease the amount of time that you wait before being seen by our physicians.       _____________________________________________________________  Should you have questions after your visit to [facility], please contact our office at [phone] between the hours of 8:30 a.m. and 4:30 p.m.  Voicemails left after 4:30 p.m. will  not be returned until the following business day.  For prescription refill requests, have your pharmacy contact our office.         Resources For Cancer Patients and their Caregivers ? American Cancer Society: Can assist with transportation, wigs, general needs, runs Look Good Feel Better.        986-372-3017 ? Cancer Care: Provides financial assistance, online support groups, medication/co-pay assistance.  1-800-813-HOPE 910-183-1288) ? [facility] Assists [place] cancer patients and their families through emotional , educational and financial support.  (202)534-0701 ? [place] DSS Where to apply for food stamps, Medicaid and utility assistance. 407 588 7463 ? [employer]: Transportation to medical appointments. 340-348-9196 ? Social Security Administration: May apply for disability if have a Stage IV cancer. (731)656-3176 505-628-6790 ? [employer], Disability and Transit Services: Assists with nutrition, care and transit needs. (314)376-2136

## 2016-02-13 NOTE — Progress Notes (Signed)
[facility] Progress Note  Name: Andrea Martinez      MRN: [id number]   Date: 02/13/2016 Time:3:27 PM   REFERRING PHYSICIAN:  Sinda Du, MD   DIAGNOSIS:  CML    HISTORY OF PRESENT ILLNESS:   Andrea Martinez is a 70 year old white American female here for additional f/u of CML  Andrea Martinez returns to the [facility] today accompanied by her husband.   When asked how she's doing, she says "I'm just tired." Her blood counts were discussed with her, specifically how good they look.  She says she doesn't take any medicine for nausea since it constipates her so badly, and wants to know when the best time to take her "depression pill" would be. She says she has not thrown up at all.  She says that her appetite comes and goes, and that her weight is down over 7 pounds when compared to 2 weeks ago.  She says she is still tired and having abdominal cramps at times. She wants to know if this is coming from anything, and was advised that it could be her IBS flaring because she is on a new medication. She says that when she feels very anxious, she tries to work through it without taking medicine. She says she her moments where she cries and prays, but the crying is getting better. Emotionally, she says "if I can get my strength back to where I feel like I want to do something, and I get my sleep pattern better, I think I'll feel better."  At night, she says she will go to sleep, and wake up, and go to sleep, and wake up. She feels she has too many things on her mind and is "just tired." She has never tried to take anything to aid in sleeping, including melatonin. She does not currently go to bed until 10:30 to work in her twelve-hour Tasigna dose. She was advised that she does not need to wait a full 12 hours to take her second Tasigna, and can take it a bit earlier if she wants to sleep.  She used to have bruises "all up her legs" and a rash, both of which she  reports are gone.   From [facility]: (Consultation with Dr. Florene Glen)  Her blood counts are largely the same today as earlier this week. Today, her WBCS are 162.9k with an ANC of 81.6k and platelets are 258k. Of note, uric acid is elevated to 8.6 and LDH is elevated to 985. Creatinine is within normal limits at 0.86. On exam, I had difficulty feeling her spleen (there is mild fullness very laterally). We discussed the mechanism of leukemia and I informed her that with a white count this high (20x normal limit), we become concerned about acute leukemia. Fortunately, we did not notice an increase in blast cells today, which indicates that she likely has chronic myeloid leukemia as opposed to acute leukemia. I explained that we are now able to manage CML with high success rates on an outpatient basis with oral medication. I furthermore discussed that we will perform additional testing for chromosomal abnormalities (including Philadelphia chromosome) to confirm a diagnosis of CML. PCR testing is also pending. I recommend starting Hydrea to prevent her white count from increasing further and starting Allopurinol to help protect her kidneys.  I explained that if CML is confirmed, we will plan to start her on an oral TKI indefinitely. If testing does  not confirm CML, the next step would be a bone marrow biopsy. However, at this point we feel that she likely has CML and will not need a bone marrow biopsy.   PAST MEDICAL HISTORY:   Past Medical History  Diagnosis Date  . Gastric ulcer 2011    EGD, 5/11  . Hypothyroidism     not on meds, followed by Dr. Elyse Hsu  . PONV (postoperative nausea and vomiting)   . Lipoma     left upper arm  . UTI (lower urinary tract infection)     frequent  . Leukocytosis 11/20/2015  . 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 Codeine Nausea Only    Headache  . Esomeprazole Magnesium Nausea  Only  . Sulfonamide Derivatives Nausea Only    Headache  . Fluconazole Rash    Redness and blistering on left thigh      MEDICATIONS: I have reviewed the patient's current medications.    Current Outpatient Prescriptions on File Prior to Visit  Medication Sig Dispense Refill  . acetaminophen (TYLENOL) 500 MG tablet Take 500 mg by mouth every 6 (six) hours as needed.    . ALPRAZolam (XANAX) 0.25 MG tablet Take 1 tablet (0.25 mg total) by mouth every 6 (six) hours as needed for anxiety. 30 tablet 1  . benzonatate (TESSALON) 200 MG capsule Take 1 capsule (200 mg total) by mouth 3 (three) times daily as needed for cough. 45 capsule 2  . cetirizine (ZYRTEC) 10 MG tablet Take 10 mg by mouth as needed for allergies.    . Cholecalciferol (VITAMIN D-3) 5000 UNITS TABS Take 5,000 Units by mouth daily.      Marland Kitchen conjugated estrogens (PREMARIN) vaginal cream Place 1 Applicatorful vaginally 2 (two) times a week. 1/2 to 1g    . escitalopram (LEXAPRO) 10 MG tablet Take 1 tablet (10 mg total) by mouth daily. 30 tablet 2  . Multiple Vitamins-Minerals (CENTRUM SILVER PO) Take 1 capsule by mouth daily.    . ondansetron (ZOFRAN) 8 MG tablet Take 1 tablet (8 mg total) by mouth every 8 (eight) hours as needed for nausea or vomiting. 30 tablet 2  . Probiotic Product (DIGESTIVE ADVANTAGE GUMMIES PO) Take 2 tablets by mouth daily.    . traMADol (ULTRAM) 50 MG tablet Take 1 tablet (50 mg total) by mouth every 6 (six) hours as needed. 30 tablet 0  . triamcinolone (NASACORT ALLERGY 24HR) 55 MCG/ACT AERO nasal inhaler Place 2 sprays into the nose as needed.    . triamcinolone cream (KENALOG) 0.1 % Apply 1 application topically 2 (two) times daily. 453.6 g 0  . vitamin B-12 (CYANOCOBALAMIN) 1000 MCG tablet Take 5,000 mcg by mouth daily.     . Cholestyramine Light (PREVALITE PO) Take 4 g by mouth as needed. Reported on 02/13/2016     No current facility-administered medications on file prior to visit.     PAST SURGICAL  HISTORY Past Surgical History  Procedure Laterality Date  . Esophagogastroduodenoscopy  02/2010    gastric ulcers  . Partial hysterectomy      vaginal at age 66 years of age  . Nose surgery    . Lipoma excision  08/02/2011    left shoulder  . Colonoscopy  01/2004    [facility], few small tics  . Esophagogastroduodenoscopy  12/04/2012    HRC:BULAGTX gastritis (inflammation) was found in the gastric antrum; multiple biopsies The duodenal mucosa showed no abnormalities in  the bulb and second portion of the duodenum  . Bravo ph study  12/04/2012    Procedure: BRAVO [place];  Surgeon: [name], MD;  [facility];  Service: Endoscopy;  Laterality: N/A;  . Toe debridement  1962    lt great toe  . Cholecystectomy N/A 01/03/2014    Procedure: LAPAROSCOPIC CHOLECYSTECTOMY WITH INTRAOPERATIVE CHOLANGIOGRAM;  Surgeon: Joyice Faster. Cornett, MD;  Location: [facility];  Service: General;  Laterality: N/A;    FAMILY HISTORY: Family History  Problem Relation Age of Onset  . Lung cancer Father   . COPD Mother   . Colon cancer Neg Hx   . Anesthesia problems Neg Hx   . Hypotension Neg Hx   . Malignant hyperthermia Neg Hx   . Pseudochol deficiency Neg Hx     SOCIAL HISTORY:  reports that she has never smoked. She has never used smokeless tobacco. She reports that she does not drink alcohol or use illicit drugs.  PERFORMANCE STATUS: The patient's performance status is 1 - Symptomatic but completely ambulatory  REVIEW OF SYSTEMS: Positive for anxiety.     Anxiety-related to financial stress.  14 point review of systems was performed and is negative except as detailed under history of present illness and above   PHYSICAL EXAM: Most Recent Vital Signs: Pulse 67, temperature 98 F (36.7 C), temperature source Oral, resp. rate 18, weight 198 lb (89.812 kg), SpO2 99 %. General appearance: alert, cooperative, appears stated age, fatigued, no distress, moderately obese ,  anxious, and accompanied by her husband, teary eyed  Head: Normocephalic, without obvious abnormality, atraumatic Eyes: negative findings: lids and lashes normal, conjunctivae and sclerae normal, corneas clear and pupils equal, round, reactive to light and accomodation Throat: lips, mucosa, and tongue normal; teeth and gums normal Neck: no adenopathy and supple, symmetrical, trachea midline Lungs: clear to auscultation bilaterally and normal percussion bilaterally Heart: regular rate and rhythm, S1, S2 normal, no murmur, click, rub or gallop Abdomen: normal findings: bowel sounds normal, liver span normal to percussion, no bruits heard, no masses palpable, no organomegaly, no renal abnormalities palpable, spleen non-palpable and symmetric and abnormal findings:  mild tenderness in the lower abdomen Extremities: Homans sign is negative, no sign of DVT and no edema, redness or tenderness in the calves or thighs Skin: Skin color, texture, turgor normal. No rashes or lesions Lymph nodes: Cervical, supraclavicular, and axillary nodes normal. Neurologic: Grossly normal  LABORATORY DATA:  I have reviewed the data as listed.   LDH 98 - 192 U/L 141 136 153 163        Magnesium 1.7 - 2.4 mg/dL 2.2 2.2 2.1 2.1        Phosphorus 2.5 - 4.6 mg/dL 3.7 3.7 3.4 3.7       CBC    Component Value Date/Time   WBC 5.7 02/13/2016 1242   RBC 3.21* 02/13/2016 1242   RBC 2.95* 02/06/2016 1238   HGB 10.3* 02/13/2016 1242   HCT 32.4* 02/13/2016 1242   PLT 261 02/13/2016 1242   MCV 100.9* 02/13/2016 1242   MCH 32.1 02/13/2016 1242   MCHC 31.8 02/13/2016 1242   RDW 15.6* 02/13/2016 1242   LYMPHSABS 1.0 02/13/2016 1242   MONOABS 0.3 02/13/2016 1242   EOSABS 0.0 02/13/2016 1242   BASOSABS 0.0 02/13/2016 1242   CMP     Component Value Date/Time   NA 140 02/13/2016 1242   K 4.3 02/13/2016 1242   CL 106 02/13/2016 1242  CO2 27 02/13/2016 1242   GLUCOSE 108* 02/13/2016 1242   BUN 15 02/13/2016  1242   CREATININE 0.67 02/13/2016 1242   CREATININE 0.87 07/14/2011 0938   CALCIUM 9.1 02/13/2016 1242   PROT 7.2 02/13/2016 1242   ALBUMIN 4.3 02/13/2016 1242   AST 29 02/13/2016 1242   ALT 26 02/13/2016 1242   ALKPHOS 126 02/13/2016 1242   BILITOT 0.9 02/13/2016 1242   GFRNONAA >60 02/13/2016 1242   GFRAA >60 02/13/2016 1242    PATHOLOGY:     ASSESSMENT/PLAN:  CML Anemia Tasigna 01/06/2016  EKG today looks good. We will continue with close monitoring of her QT interval.  She is to continue on tasigna as prescribed.  Her anxiety is somewhat better.   We will obtain CBC, CMP and phosphorous every 2 weeks. In 6 weeks, she will return to see me. PCR for BCR-ABL in 6 weeks.  Monitoring Response to TKI therapy  Bone marrow cytogenetics: at diagnosis, failure to reach response milestones and any sign of loss of response (defined as hematologic or cytogenetic relapse)  Quantitative RT-PCR using is preferred: at diagnosis, every 3 months after initiating treatment, after BCR-ABL1 0.1% - , 1% (IS) has been achieved, every 3 months for 2 years and every 3 - 6 months thereafter. If there is 1 log increase in BCR-ABL1 transcript levels with MMR, QPCR should be repeated in 1 - 3 months  BCR-ABL kinase domain mutation analysis: Chronic phase: failure to reach response milestones, any sign of loss of response (defined as hematologic or cytogenetic relapse) 1 - log increase in BCR-ABL1 transcript levels and loss of MMR. Disease progression to accelerated or blast phase  Criteria for Hematologic and molecular response   Complete Hematologic response Complete normalization of peripheral blood counts with leukocyte count ,10x103/L Platelet count < 450 x 109/L NO immature cells such as myelocytes, promyelocytes or blasts in peripheral blood No signs and symptoms of disease with disappearance of palpable splenomegaly  Molecular response Early molecular response  BCR-ABL1<= 10% (IS) at 3 and 6  months Major molecular response BCR-ABL1,0.1%  (IS) or > = 3 log reduction in BCR-ABL92mNA from the standardized baseline  Complete molecular response no detectable BCR-ABL11mA using a QPCR assay with a sensitivity of at least 4.5 logs below the standardized baseline   Orders Placed This Encounter  Procedures  . Occult blood x 1 card to lab, stool    Standing Status: Future     Number of Occurrences: 1     Standing Expiration Date: 02/12/2017  . Occult blood x 1 card to lab, stool    Standing Status: Future     Number of Occurrences: 1     Standing Expiration Date: 02/12/2017  . Occult blood x 1 card to lab, stool    Standing Status: Future     Number of Occurrences: 1     Standing Expiration Date: 02/12/2017  . CBC with Differential    Standing Status: Standing     Number of Occurrences: 10     Standing Expiration Date: 02/12/2017  . Comprehensive metabolic panel    Standing Status: Standing     Number of Occurrences: 10     Standing Expiration Date: 02/12/2017  . Lactate dehydrogenase    Standing Status: Standing     Number of Occurrences: 10     Standing Expiration Date: 02/12/2017  . Magnesium    Standing Status: Standing     Number of Occurrences: 10     Standing  Expiration Date: 02/12/2017  . Phosphorus    Standing Status: Standing     Number of Occurrences: 10     Standing Expiration Date: 02/12/2018   All questions were answered. The patient knows to call the clinic with any problems, questions or concerns. We can certainly see the patient much sooner if necessary.  This document serves as a record of services personally performed by Ancil Linsey, MD. It was created on her behalf by Toni Amend, a trained medical scribe. The creation of this record is based on the scribe's personal observations and the provider's statements to them. This document has been checked and approved by the attending provider.  I have reviewed the above documentation for accuracy and  completeness, and I agree with the above.  This note is electronically signed by: Molli Hazard, MD  02/13/2016 3:27 PM

## 2016-02-13 NOTE — Telephone Encounter (Signed)
Patient was pre-screened approved for Tasigna via phone. Pt to receive an application in the mail from [employer] regarding this. Patient notified. New script to be sent to [employer] @ [phone].

## 2016-02-20 ENCOUNTER — Telehealth (HOSPITAL_COMMUNITY): Payer: Self-pay | Admitting: *Deleted

## 2016-02-20 NOTE — Telephone Encounter (Signed)
Patient is under Benefit Investigation currently. Due to the fact that she has prescription coverage - this makes benefits investigation longer. Once they make a decision - they will fax a letter to [place] and the patient notifying us of their decision.

## 2016-02-20 NOTE — Telephone Encounter (Signed)
Pt states her yeast infection is a little bit better.

## 2016-02-27 ENCOUNTER — Encounter (HOSPITAL_COMMUNITY): Payer: Medicare Other

## 2016-02-27 DIAGNOSIS — R109 Unspecified abdominal pain: Secondary | ICD-10-CM | POA: Diagnosis not present

## 2016-02-27 DIAGNOSIS — Z801 Family history of malignant neoplasm of trachea, bronchus and lung: Secondary | ICD-10-CM | POA: Diagnosis not present

## 2016-02-27 DIAGNOSIS — C9210 Chronic myeloid leukemia, BCR/ABL-positive, not having achieved remission: Secondary | ICD-10-CM | POA: Diagnosis not present

## 2016-02-27 DIAGNOSIS — E039 Hypothyroidism, unspecified: Secondary | ICD-10-CM | POA: Diagnosis not present

## 2016-02-27 DIAGNOSIS — Z9889 Other specified postprocedural states: Secondary | ICD-10-CM | POA: Diagnosis not present

## 2016-02-27 DIAGNOSIS — Z9049 Acquired absence of other specified parts of digestive tract: Secondary | ICD-10-CM | POA: Diagnosis not present

## 2016-02-27 DIAGNOSIS — D649 Anemia, unspecified: Secondary | ICD-10-CM | POA: Diagnosis not present

## 2016-02-27 DIAGNOSIS — Z79899 Other long term (current) drug therapy: Secondary | ICD-10-CM | POA: Diagnosis not present

## 2016-02-27 DIAGNOSIS — Z888 Allergy status to other drugs, medicaments and biological substances status: Secondary | ICD-10-CM | POA: Diagnosis not present

## 2016-02-27 LAB — CBC WITH DIFFERENTIAL/PLATELET
BASOS PCT: 0 %
Basophils Absolute: 0 10*3/uL (ref 0.0–0.1)
EOS ABS: 0.1 10*3/uL (ref 0.0–0.7)
Eosinophils Relative: 1 %
HCT: 30.4 % — ABNORMAL LOW (ref 36.0–46.0)
HEMOGLOBIN: 9.5 g/dL — AB (ref 12.0–15.0)
Lymphocytes Relative: 18 %
Lymphs Abs: 1 10*3/uL (ref 0.7–4.0)
MCH: 30.7 pg (ref 26.0–34.0)
MCHC: 31.3 g/dL (ref 30.0–36.0)
MCV: 98.4 fL (ref 78.0–100.0)
MONOS PCT: 8 %
Monocytes Absolute: 0.5 10*3/uL (ref 0.1–1.0)
NEUTROS PCT: 73 %
Neutro Abs: 4 10*3/uL (ref 1.7–7.7)
Platelets: 296 10*3/uL (ref 150–400)
RBC: 3.09 MIL/uL — ABNORMAL LOW (ref 3.87–5.11)
RDW: 16 % — AB (ref 11.5–15.5)
WBC: 5.5 10*3/uL (ref 4.0–10.5)

## 2016-02-27 LAB — COMPREHENSIVE METABOLIC PANEL
ALT: 32 U/L (ref 14–54)
AST: 38 U/L (ref 15–41)
Albumin: 4.1 g/dL (ref 3.5–5.0)
Alkaline Phosphatase: 140 U/L — ABNORMAL HIGH (ref 38–126)
Anion gap: 7 (ref 5–15)
BUN: 22 mg/dL — ABNORMAL HIGH (ref 6–20)
CHLORIDE: 106 mmol/L (ref 101–111)
CO2: 26 mmol/L (ref 22–32)
CREATININE: 0.71 mg/dL (ref 0.44–1.00)
Calcium: 8.8 mg/dL — ABNORMAL LOW (ref 8.9–10.3)
GFR calc non Af Amer: >60 mL/min (ref >60)
GLUCOSE: 106 mg/dL — AB (ref 65–99)
Potassium: 4.2 mmol/L (ref 3.5–5.1)
Sodium: 139 mmol/L (ref 135–145)
Total Bilirubin: 1.3 mg/dL — ABNORMAL HIGH (ref 0.3–1.2)
Total Protein: 6.9 g/dL (ref 6.5–8.1)

## 2016-02-27 LAB — LACTATE DEHYDROGENASE: LDH: 158 U/L (ref 98–192)

## 2016-02-27 LAB — MAGNESIUM: MAGNESIUM: 2.1 mg/dL (ref 1.7–2.4)

## 2016-02-27 LAB — PHOSPHORUS: PHOSPHORUS: 3.1 mg/dL (ref 2.5–4.6)

## 2016-03-09 ENCOUNTER — Telehealth (HOSPITAL_COMMUNITY): Payer: Self-pay | Admitting: Hematology & Oncology

## 2016-03-09 NOTE — Telephone Encounter (Signed)
PC TO BMS SPK WITH [employer] ADVISED HIM THAT PT IS NO LONGER TAKING SPRYCEL. HE ADVISED ME IF PT NEEDS TO RESTART PRIOR TO 12/12/16 TO CONTACT THEM [id number] P [id number] F

## 2016-03-09 NOTE — Telephone Encounter (Signed)
PC TO PT TO LET HER KNOW THAT TASIGNA HAS BEEN APPROVED BY [employer] FOR 2017

## 2016-03-09 NOTE — Telephone Encounter (Signed)
T[employer]IGNA[employer] [phone] P [id number] F 3/17-12/31/17 OPT RX AUTH'D

## 2016-03-12 ENCOUNTER — Encounter (HOSPITAL_COMMUNITY): Payer: Medicare Other

## 2016-03-12 DIAGNOSIS — D649 Anemia, unspecified: Secondary | ICD-10-CM | POA: Diagnosis not present

## 2016-03-12 DIAGNOSIS — Z888 Allergy status to other drugs, medicaments and biological substances status: Secondary | ICD-10-CM | POA: Diagnosis not present

## 2016-03-12 DIAGNOSIS — C9210 Chronic myeloid leukemia, BCR/ABL-positive, not having achieved remission: Secondary | ICD-10-CM | POA: Diagnosis not present

## 2016-03-12 DIAGNOSIS — Z9889 Other specified postprocedural states: Secondary | ICD-10-CM | POA: Diagnosis not present

## 2016-03-12 DIAGNOSIS — E039 Hypothyroidism, unspecified: Secondary | ICD-10-CM | POA: Diagnosis not present

## 2016-03-12 DIAGNOSIS — Z801 Family history of malignant neoplasm of trachea, bronchus and lung: Secondary | ICD-10-CM | POA: Diagnosis not present

## 2016-03-12 DIAGNOSIS — Z79899 Other long term (current) drug therapy: Secondary | ICD-10-CM | POA: Diagnosis not present

## 2016-03-12 DIAGNOSIS — R109 Unspecified abdominal pain: Secondary | ICD-10-CM | POA: Diagnosis not present

## 2016-03-12 DIAGNOSIS — Z9049 Acquired absence of other specified parts of digestive tract: Secondary | ICD-10-CM | POA: Diagnosis not present

## 2016-03-12 LAB — COMPREHENSIVE METABOLIC PANEL
ALBUMIN: 4.1 g/dL (ref 3.5–5.0)
ALK PHOS: 161 U/L — AB (ref 38–126)
ALT: 33 U/L (ref 14–54)
ANION GAP: 7 (ref 5–15)
AST: 43 U/L — ABNORMAL HIGH (ref 15–41)
BILIRUBIN TOTAL: 1 mg/dL (ref 0.3–1.2)
BUN: 19 mg/dL (ref 6–20)
CHLORIDE: 106 mmol/L (ref 101–111)
CO2: 26 mmol/L (ref 22–32)
CREATININE: 0.69 mg/dL (ref 0.44–1.00)
Calcium: 8.7 mg/dL — ABNORMAL LOW (ref 8.9–10.3)
GFR calc Af Amer: >60 mL/min (ref >60)
GFR calc non Af Amer: >60 mL/min (ref >60)
GLUCOSE: 118 mg/dL — AB (ref 65–99)
Potassium: 4.4 mmol/L (ref 3.5–5.1)
SODIUM: 139 mmol/L (ref 135–145)
Total Protein: 7.1 g/dL (ref 6.5–8.1)

## 2016-03-12 LAB — CBC WITH DIFFERENTIAL/PLATELET
BASOS PCT: 0 %
Basophils Absolute: 0 10*3/uL (ref 0.0–0.1)
EOS ABS: 0.1 10*3/uL (ref 0.0–0.7)
Eosinophils Relative: 1 %
HEMATOCRIT: 32 % — AB (ref 36.0–46.0)
HEMOGLOBIN: 9.9 g/dL — AB (ref 12.0–15.0)
LYMPHS ABS: 1 10*3/uL (ref 0.7–4.0)
Lymphocytes Relative: 15 %
MCH: 28.9 pg (ref 26.0–34.0)
MCHC: 30.9 g/dL (ref 30.0–36.0)
MCV: 93.3 fL (ref 78.0–100.0)
MONOS PCT: 8 %
Monocytes Absolute: 0.6 10*3/uL (ref 0.1–1.0)
NEUTROS PCT: 76 %
Neutro Abs: 5.3 10*3/uL (ref 1.7–7.7)
Platelets: 262 10*3/uL (ref 150–400)
RBC: 3.43 MIL/uL — AB (ref 3.87–5.11)
RDW: 15.6 % — ABNORMAL HIGH (ref 11.5–15.5)
WBC: 7 10*3/uL (ref 4.0–10.5)

## 2016-03-12 LAB — URINALYSIS, ROUTINE W REFLEX MICROSCOPIC
Bilirubin Urine: NEGATIVE
Glucose, UA: NEGATIVE mg/dL
Hgb urine dipstick: NEGATIVE
Ketones, ur: NEGATIVE mg/dL
LEUKOCYTES UA: NEGATIVE
NITRITE: NEGATIVE
PH: 6 (ref 5.0–8.0)
Protein, ur: NEGATIVE mg/dL
SPECIFIC GRAVITY, URINE: 1.015 (ref 1.005–1.030)

## 2016-03-12 LAB — MAGNESIUM: Magnesium: 2.1 mg/dL (ref 1.7–2.4)

## 2016-03-12 LAB — PHOSPHORUS: Phosphorus: 3.3 mg/dL (ref 2.5–4.6)

## 2016-03-12 LAB — LACTATE DEHYDROGENASE: LDH: 150 U/L (ref 98–192)

## 2016-03-30 ENCOUNTER — Encounter (HOSPITAL_COMMUNITY): Payer: Medicare Other | Attending: Hematology & Oncology | Admitting: Hematology & Oncology

## 2016-03-30 ENCOUNTER — Encounter: Payer: Self-pay | Admitting: *Deleted

## 2016-03-30 ENCOUNTER — Encounter (HOSPITAL_COMMUNITY): Payer: Medicare Other

## 2016-03-30 ENCOUNTER — Encounter (HOSPITAL_COMMUNITY): Payer: Self-pay | Admitting: Hematology & Oncology

## 2016-03-30 VITALS — BP 136/60 | HR 73 | Temp 98.1°F | Resp 18 | Wt 198.6 lb

## 2016-03-30 DIAGNOSIS — E039 Hypothyroidism, unspecified: Secondary | ICD-10-CM

## 2016-03-30 DIAGNOSIS — R110 Nausea: Secondary | ICD-10-CM

## 2016-03-30 DIAGNOSIS — D649 Anemia, unspecified: Secondary | ICD-10-CM

## 2016-03-30 DIAGNOSIS — Z79899 Other long term (current) drug therapy: Secondary | ICD-10-CM | POA: Insufficient documentation

## 2016-03-30 DIAGNOSIS — F418 Other specified anxiety disorders: Secondary | ICD-10-CM | POA: Diagnosis not present

## 2016-03-30 DIAGNOSIS — Z888 Allergy status to other drugs, medicaments and biological substances status: Secondary | ICD-10-CM | POA: Diagnosis not present

## 2016-03-30 DIAGNOSIS — C9210 Chronic myeloid leukemia, BCR/ABL-positive, not having achieved remission: Secondary | ICD-10-CM | POA: Diagnosis not present

## 2016-03-30 DIAGNOSIS — Z9889 Other specified postprocedural states: Secondary | ICD-10-CM | POA: Diagnosis not present

## 2016-03-30 LAB — CBC WITH DIFFERENTIAL/PLATELET
BASOS ABS: 0 10*3/uL (ref 0.0–0.1)
Basophils Relative: 0 %
EOS ABS: 0.2 10*3/uL (ref 0.0–0.7)
EOS PCT: 2 %
HCT: 31.7 % — ABNORMAL LOW (ref 36.0–46.0)
Hemoglobin: 10.1 g/dL — ABNORMAL LOW (ref 12.0–15.0)
LYMPHS ABS: 1.2 10*3/uL (ref 0.7–4.0)
LYMPHS PCT: 18 %
MCH: 28.4 pg (ref 26.0–34.0)
MCHC: 31.9 g/dL (ref 30.0–36.0)
MCV: 89 fL (ref 78.0–100.0)
MONO ABS: 0.5 10*3/uL (ref 0.1–1.0)
Monocytes Relative: 8 %
Neutro Abs: 5 10*3/uL (ref 1.7–7.7)
Neutrophils Relative %: 72 %
Platelets: 254 10*3/uL (ref 150–400)
RBC: 3.56 MIL/uL — ABNORMAL LOW (ref 3.87–5.11)
RDW: 14.9 % (ref 11.5–15.5)
WBC: 6.9 10*3/uL (ref 4.0–10.5)

## 2016-03-30 LAB — COMPREHENSIVE METABOLIC PANEL
ALK PHOS: 157 U/L — AB (ref 38–126)
ALT: 25 U/L (ref 14–54)
AST: 34 U/L (ref 15–41)
Albumin: 4 g/dL (ref 3.5–5.0)
Anion gap: 8 (ref 5–15)
BILIRUBIN TOTAL: 0.8 mg/dL (ref 0.3–1.2)
BUN: 17 mg/dL (ref 6–20)
CO2: 27 mmol/L (ref 22–32)
CREATININE: 0.69 mg/dL (ref 0.44–1.00)
Calcium: 8.9 mg/dL (ref 8.9–10.3)
Chloride: 107 mmol/L (ref 101–111)
GFR calc Af Amer: >60 mL/min (ref >60)
GLUCOSE: 118 mg/dL — AB (ref 65–99)
Potassium: 4.2 mmol/L (ref 3.5–5.1)
Sodium: 142 mmol/L (ref 135–145)
TOTAL PROTEIN: 7.3 g/dL (ref 6.5–8.1)

## 2016-03-30 LAB — LACTATE DEHYDROGENASE: LDH: 151 U/L (ref 98–192)

## 2016-03-30 LAB — PHOSPHORUS: Phosphorus: 2.9 mg/dL (ref 2.5–4.6)

## 2016-03-30 LAB — MAGNESIUM: Magnesium: 2 mg/dL (ref 1.7–2.4)

## 2016-03-30 MED ORDER — PROCHLORPERAZINE MALEATE 10 MG PO TABS: 10.0000 mg | ORAL_TABLET | Freq: Four times a day (QID) | ORAL | Status: DC | PRN

## 2016-03-30 MED ORDER — PROMETHAZINE HCL 25 MG PO TABS: 25.0000 mg | ORAL_TABLET | Freq: Four times a day (QID) | ORAL | Status: DC | PRN

## 2016-03-30 MED ORDER — ESCITALOPRAM OXALATE 20 MG PO TABS: 20.0000 mg | ORAL_TABLET | Freq: Every day | ORAL | Status: DC

## 2016-03-30 NOTE — Patient Instructions (Addendum)
Andrea Martinez at [facility] Discharge Instructions  RECOMMENDATIONS MADE BY THE CONSULTANT AND ANY TEST RESULTS WILL BE SENT TO YOUR REFERRING PHYSICIAN.   Exam and discussion by Dr Whitney Muse today Labs are stable, some are pending, we will call you with those results  1 week for a EKG Lexapro, phenergan, and compazine sent to your pharmacy  Return to see the doctor in 2 weeks  Please call the clinic if you have any questions or concerns    Thank you for choosing [facility] at [facility] to provide your oncology and hematology care.  To afford each patient quality time with our provider, please arrive at least 15 minutes before your scheduled appointment time.   Beginning January 23rd 2017 lab work for the [facility] will be done in the  Main lab at [facility] on 1st floor. If you have a lab appointment with the [facility] please come in thru the  Main Entrance and check in at the main information desk  You need to re-schedule your appointment should you arrive 10 or more minutes late.  We strive to give you quality time with our providers, and arriving late affects you and other patients whose appointments are after yours.  Also, if you no show three or more times for appointments you may be dismissed from the clinic at the providers discretion.     Again, thank you for choosing [facility].  Our hope is that these requests will decrease the amount of time that you wait before being seen by our physicians.       _____________________________________________________________  Should you have questions after your visit to [facility], please contact our office at [phone] between the hours of 8:30 a.m. and 4:30 p.m.  Voicemails left after 4:30 p.m. will not be returned until the following business day.  For prescription refill requests, have your pharmacy contact our office.         Resources For Cancer  Patients and their Caregivers ? American Cancer Society: Can assist with transportation, wigs, general needs, runs Look Good Feel Better.        828-494-4153 ? Cancer Care: Provides financial assistance, online support groups, medication/co-pay assistance.  1-800-813-HOPE 267-307-9101) ? [place] Assists [place] cancer patients and their families through emotional , educational and financial support.  3257871446 ? [place] DSS Where to apply for food stamps, Medicaid and utility assistance. (531) 644-5041 ? [facility]: Transportation to medical appointments. (703) 499-4400 ? Social Security Administration: May apply for disability if have a Stage IV cancer. (332)675-1160 (804) 847-4116 ? [employer], Disability and Transit Services: Assists with nutrition, care and transit needs. 323-468-3795

## 2016-03-30 NOTE — Progress Notes (Signed)
[place] Clinical Social Work  Clinical Social Work was referred by MD for assessment of psychosocial needs due to adjustment to illness concerns. Clinical Education officer, museum met with patient and husband at [facility] after MD visit to offer support and assess for needs. Pt reports she has not shared her illness with extended family and friends to date. She reports she really thought she would be "feeling better after three months of treatment". CSW validated emotions and discussed common emotions and anxieties related to her diagnosis. CSW discussed with pt possible ways to begin to move forward and adjust to her illness. She feels talking through her emotions was helpful today and she is very open to attending support programs either through [facility] or [facility]. CSW provided her with Support Calendars for [facility], [facility] and [facility]. Pt also aware she could meet with CSW for additional counseling and support.     Clinical Social Work interventions: Administrator education and referral  [place] Tani [place]go, [place] Tuesdays   [phone]

## 2016-03-30 NOTE — Progress Notes (Signed)
[place] at [facility] Hematology/Oncology Progress Note  Name: Andrea Martinez      MRN: [id number]   Date: 03/30/2016 Time:4:28 PM   REFERRING PHYSICIAN:  Sinda Du, MD   DIAGNOSIS:  CML    HISTORY OF PRESENT ILLNESS:   Andrea Martinez is a 70 year old white American female here for additional f/u of CML  Andrea Martinez returns to the [facility] today accompanied by her husband.  She says she's hoping to get outside and work with her flowers as the weather improves.  Her labs look good today. Today her hemoglobin has come up, gradually. Her PCR results are not back yet. Overall, everything appears to be going well.  She notes that lately she's been staying at home because she is weak. She says she is trying to "stay away from flus and viruses as much as I can."  She asks if she is going to have all of the side effects where her legs and feet ache, as well as the leg swelling. She was advised that, for some people, this gets better over time.  She says she experiences swelling around her toes which then goes away. She notes that from her knees down, and across her back, she experiences pain. Taking a pain pill can "ease it up." She says she hasn't taken over two pain pills a day over the past six months. When asked if she is able to be more active after taking a pain pill, she notes "somewhat."  She also has been experiencing some nausea, and says she has to decide which to take: the pain pill or the nausea pill. She says she thinks that the pain pill and the chemo pill make her nauseated, but notes that she hasn't had but one nausea pill since she was prescribed them. She says it constipated her so badly that she stopped taking them.  She also notes that she is having some "trouble with her bladder," noting that the other day she had to go to the bathroom 7 times. She says sometimes it will burn, sometimes not; that sometimes she goes a lot, sometimes she goes  a little. She says she's been drinking cranberry juice and seeing her gynecologist. She notes that, in December, she consulted about a pessary procedure.  She says "I was told that my hair would not come out, and it's thinning." Her husband notes that he believes that she is too worried about everything, and that the worry could be causing some of her hair loss.  Her husband notes that she is very depressed. Their relationship/sex life has been floundering, and they don't even go out to eat anymore. She notes that her antidepressant isn't helping her and that she can't even tell she's taking anything. She notes that she hasn't taken a single anxiety pill, because she "is not a person that likes taking pills." She asks "you can mix any of this together?" and was advised that, yes, they can.  She also notes a rash on her leg, back, and "little tiny bump like things" on her face. She has used a cream to treat some of her rash, to success.  Standing up to walk to the exam table takes her some effort. When asked how active she's been lately, she says "not much." Her husband says that she does a whole lot of housework, but that she does it all at one time.  She says she  believes that some of her mental state is due to witnessing her father's struggle with cancer, and not understanding that her health is not the same as her father's.  PAST MEDICAL HISTORY:   Past Medical History  Diagnosis Date  . Gastric ulcer 2011    EGD, 5/11  . Hypothyroidism     not on meds, followed by Dr. Elyse Hsu  . PONV (postoperative nausea and vomiting)   . Lipoma     left upper arm  . UTI (lower urinary tract infection)     frequent  . Leukocytosis 11/20/2015  . 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 Codeine Nausea Only    Headache  . Esomeprazole Magnesium Nausea Only  . Sulfonamide Derivatives Nausea Only    Headache  .  Fluconazole Rash    Redness and blistering on left thigh      MEDICATIONS: I have reviewed the patient's current medications.    Current Outpatient Prescriptions on File Prior to Visit  Medication Sig Dispense Refill  . acetaminophen (TYLENOL) 500 MG tablet Take 500 mg by mouth every 6 (six) hours as needed.    . ALPRAZolam (XANAX) 0.25 MG tablet Take 1 tablet (0.25 mg total) by mouth every 6 (six) hours as needed for anxiety. (Patient not taking: Reported on 03/30/2016) 30 tablet 1  . benzonatate (TESSALON) 200 MG capsule Take 1 capsule (200 mg total) by mouth 3 (three) times daily as needed for cough. (Patient not taking: Reported on 03/30/2016) 45 capsule 2  . cetirizine (ZYRTEC) 10 MG tablet Take 10 mg by mouth as needed for allergies.    . Cholecalciferol (VITAMIN D-3) 5000 UNITS TABS Take 5,000 Units by mouth daily.      . Cholestyramine Light (PREVALITE PO) Take 4 g by mouth as needed. Reported on 02/13/2016    . conjugated estrogens (PREMARIN) vaginal cream Place 1 Applicatorful vaginally 2 (two) times a week. 1/2 to 1g    . Multiple Vitamins-Minerals (CENTRUM SILVER PO) Take 1 capsule by mouth daily.    . nilotinib (TASIGNA) 150 MG capsule Take 2 capsules (300 mg total) by mouth every 12 (twelve) hours. 120 capsule 11  . ondansetron (ZOFRAN) 8 MG tablet Take 1 tablet (8 mg total) by mouth every 8 (eight) hours as needed for nausea or vomiting. (Patient not taking: Reported on 03/30/2016) 30 tablet 2  . Probiotic Product (DIGESTIVE ADVANTAGE GUMMIES PO) Take 2 tablets by mouth daily.    . traMADol (ULTRAM) 50 MG tablet Take 1 tablet (50 mg total) by mouth every 6 (six) hours as needed. 30 tablet 0  . triamcinolone (NASACORT ALLERGY 24HR) 55 MCG/ACT AERO nasal inhaler Place 2 sprays into the nose as needed.    . triamcinolone cream (KENALOG) 0.1 % Apply 1 application topically 2 (two) times daily. 453.6 g 0  . vitamin B-12 (CYANOCOBALAMIN) 1000 MCG tablet Take 5,000 mcg by mouth daily.       No current facility-administered medications on file prior to visit.     PAST SURGICAL HISTORY Past Surgical History  Procedure Laterality Date  . Esophagogastroduodenoscopy  02/2010    gastric ulcers  . Partial hysterectomy      vaginal at age 58 years of age  . Nose surgery    . Lipoma excision  08/02/2011    left shoulder  . Colonoscopy  01/2004    [facility], few small tics  . Esophagogastroduodenoscopy  12/04/2012  TWS:[id number] gastritis (inflammation) was found in the gastric antrum; multiple biopsies The duodenal mucosa showed no abnormalities in the bulb and second portion of the duodenum  . Bravo ph study  12/04/2012    Procedure: BRAVO [place];  Surgeon: [name], MD;  [facility];  Service: Endoscopy;  Laterality: N/A;  . Toe debridement  1962    lt great toe  . Cholecystectomy N/A 01/03/2014    Procedure: LAPAROSCOPIC CHOLECYSTECTOMY WITH INTRAOPERATIVE CHOLANGIOGRAM;  Surgeon: Joyice Faster. Cornett, MD;  Location: [facility];  Service: General;  Laterality: N/A;    FAMILY HISTORY: Family History  Problem Relation Age of Onset  . Lung cancer Father   . COPD Mother   . Colon cancer Neg Hx   . Anesthesia problems Neg Hx   . Hypotension Neg Hx   . Malignant hyperthermia Neg Hx   . Pseudochol deficiency Neg Hx     SOCIAL HISTORY:  reports that she has never smoked. She has never used smokeless tobacco. She reports that she does not drink alcohol or use illicit drugs.  PERFORMANCE STATUS: The patient's performance status is 1 - Symptomatic but completely ambulatory  REVIEW OF SYSTEMS: Positive for anxiety. 14 point review of systems was performed and is negative except as detailed under history of present illness and above   PHYSICAL EXAM: Most Recent Vital Signs: Blood pressure 136/60, pulse 73, temperature 98.1 F (36.7 C), temperature source Oral, resp. rate 18, weight 198 lb 9.6 oz (90.084 kg), SpO2 98 %. General appearance:  alert, cooperative, appears stated age, fatigued, no distress, moderately obese , anxious, and accompanied by her husband, teary eyed  Head: Normocephalic, without obvious abnormality, atraumatic Eyes: negative findings: lids and lashes normal, conjunctivae and sclerae normal, corneas clear and pupils equal, round, reactive to light and accomodation Throat: lips, mucosa, and tongue normal; teeth and gums normal Neck: no adenopathy and supple, symmetrical, trachea midline Lungs: clear to auscultation bilaterally and normal percussion bilaterally Heart: regular rate and rhythm, S1, S2 normal, no murmur, click, rub or gallop Abdomen: normal findings: bowel sounds normal, liver span normal to percussion, no bruits heard, no masses palpable, no organomegaly, no renal abnormalities palpable, spleen non-palpable and symmetric and abnormal findings:  mild tenderness in the lower abdomen Extremities: Homans sign is negative, no sign of DVT and no edema, redness or tenderness in the calves or thighs Skin: Skin color, texture, turgor normal. No rashes or lesions Lymph nodes: Cervical, supraclavicular, and axillary nodes normal. Neurologic: Grossly normal  LABORATORY DATA:  I have reviewed the data as listed.   LDH 98 - 192 U/L 141 136 153 163        Magnesium 1.7 - 2.4 mg/dL 2.2 2.2 2.1 2.1        Phosphorus 2.5 - 4.6 mg/dL 3.7 3.7 3.4 3.7       CBC    Component Value Date/Time   WBC 6.9 03/30/2016 0943   RBC 3.56* 03/30/2016 0943   RBC 2.95* 02/06/2016 1238   HGB 10.1* 03/30/2016 0943   HCT 31.7* 03/30/2016 0943   PLT 254 03/30/2016 0943   MCV 89.0 03/30/2016 0943   MCH 28.4 03/30/2016 0943   MCHC 31.9 03/30/2016 0943   RDW 14.9 03/30/2016 0943   LYMPHSABS 1.2 03/30/2016 0943   MONOABS 0.5 03/30/2016 0943   EOSABS 0.2 03/30/2016 0943   BASOSABS 0.0 03/30/2016 0943   CMP     Component Value Date/Time   NA 142 03/30/2016  0943   K 4.2 03/30/2016 0943   CL 107 03/30/2016 0943    CO2 27 03/30/2016 0943   GLUCOSE 118* 03/30/2016 0943   BUN 17 03/30/2016 0943   CREATININE 0.69 03/30/2016 0943   CREATININE 0.87 07/14/2011 0938   CALCIUM 8.9 03/30/2016 0943   PROT 7.3 03/30/2016 0943   ALBUMIN 4.0 03/30/2016 0943   AST 34 03/30/2016 0943   ALT 25 03/30/2016 0943   ALKPHOS 157* 03/30/2016 0943   BILITOT 0.8 03/30/2016 0943   GFRNONAA >60 03/30/2016 0943   GFRAA >60 03/30/2016 0943    PATHOLOGY:     ASSESSMENT/PLAN:  CML Anemia Tasigna 01/06/2016  She is to continue on tasigna as prescribed.  We discussed CML again in detail today. I have encouraged her to continue on her medication and that I believe some of her side effects are secondary to depression.   She will be apprised of her PCR results when they return.   Depression/Adjustment Disorder  I have asked our social worker Abby Potash to meet the patient today.  We discussed support groups. I am increasing her lexapro. We will bring her in next week for an EKG.  Nausea  I have written her for phenergan and compazine to try as needed for her nausea.   Monitoring Response to TKI therapy  Bone marrow cytogenetics: at diagnosis, failure to reach response milestones and any sign of loss of response (defined as hematologic or cytogenetic relapse)  Quantitative RT-PCR using is preferred: at diagnosis, every 3 months after initiating treatment, after BCR-ABL1 0.1% - , 1% (IS) has been achieved, every 3 months for 2 years and every 3 - 6 months thereafter. If there is 1 log increase in BCR-ABL1 transcript levels with MMR, QPCR should be repeated in 1 - 3 months  BCR-ABL kinase domain mutation analysis: Chronic phase: failure to reach response milestones, any sign of loss of response (defined as hematologic or cytogenetic relapse) 1 - log increase in BCR-ABL1 transcript levels and loss of MMR. Disease progression to accelerated or blast phase  Criteria for Hematologic and molecular response   Complete Hematologic  response Complete normalization of peripheral blood counts with leukocyte count ,10x103/L Platelet count < 450 x 109/L NO immature cells such as myelocytes, promyelocytes or blasts in peripheral blood No signs and symptoms of disease with disappearance of palpable splenomegaly  Molecular response Early molecular response  BCR-ABL1<= 10% (IS) at 3 and 6 months Major molecular response BCR-ABL1,0.1%  (IS) or > = 3 log reduction in BCR-ABL57mNA from the standardized baseline  Complete molecular response no detectable BCR-ABL132mA using a QPCR assay with a sensitivity of at least 4.5 logs below the standardized baseline   She will return for follow-up in two weeks.   All questions were answered. The patient knows to call the clinic with any problems, questions or concerns. We can certainly see the patient much sooner if necessary.  This document serves as a record of services personally performed by ShAncil LinseyMD. It was created on her behalf by KaToni Amenda trained medical scribe. The creation of this record is based on the scribe's personal observations and the provider's statements to them. This document has been checked and approved by the attending provider.  I have reviewed the above documentation for accuracy and completeness, and I agree with the above.  This note is electronically signed by: PeMolli HazardMD  03/30/2016 4:28 PM

## 2016-04-05 LAB — BCR-ABL1, CML/ALL, PCR, QUANT
b2a2 transcript: 0.317 %
b3a2 transcript: 0.194 %

## 2016-04-06 ENCOUNTER — Other Ambulatory Visit (HOSPITAL_COMMUNITY): Payer: Self-pay | Admitting: Oncology

## 2016-04-06 ENCOUNTER — Encounter (HOSPITAL_COMMUNITY): Payer: Medicare Other

## 2016-04-06 ENCOUNTER — Encounter: Payer: Self-pay | Admitting: *Deleted

## 2016-04-06 ENCOUNTER — Other Ambulatory Visit: Payer: Self-pay

## 2016-04-06 ENCOUNTER — Other Ambulatory Visit (HOSPITAL_COMMUNITY): Payer: Self-pay | Admitting: Hematology & Oncology

## 2016-04-06 DIAGNOSIS — Z9889 Other specified postprocedural states: Secondary | ICD-10-CM | POA: Diagnosis not present

## 2016-04-06 DIAGNOSIS — D649 Anemia, unspecified: Secondary | ICD-10-CM | POA: Diagnosis not present

## 2016-04-06 DIAGNOSIS — C9210 Chronic myeloid leukemia, BCR/ABL-positive, not having achieved remission: Secondary | ICD-10-CM | POA: Diagnosis not present

## 2016-04-06 DIAGNOSIS — Z79899 Other long term (current) drug therapy: Secondary | ICD-10-CM | POA: Diagnosis not present

## 2016-04-06 DIAGNOSIS — Z888 Allergy status to other drugs, medicaments and biological substances status: Secondary | ICD-10-CM | POA: Diagnosis not present

## 2016-04-06 MED ORDER — TRAMADOL HCL 50 MG PO TABS: 50.0000 mg | ORAL_TABLET | Freq: Four times a day (QID) | ORAL | Status: DC | PRN

## 2016-04-06 NOTE — Progress Notes (Signed)
[place] Clinical Social Work  Clinical Social Work checked in with pt at her follow up appointment at [facility] to offer support and assess for needs.  Pt reports she went to [facility] for additional support and is looking forward to attending their support programs. She is considering attending groups at [facility] as well. She reports to be in better spirits today and had a brighter affect as well. She agrees to reach out as needed.     Clinical Social Work interventions: Check in  [place], [place] Tuesdays   [phone]

## 2016-04-15 ENCOUNTER — Encounter (HOSPITAL_COMMUNITY): Payer: Self-pay | Admitting: Hematology & Oncology

## 2016-04-15 ENCOUNTER — Encounter (HOSPITAL_COMMUNITY): Payer: Medicare Other | Attending: Oncology | Admitting: Hematology & Oncology

## 2016-04-15 VITALS — BP 135/52 | HR 83 | Temp 98.0°F | Resp 18 | Wt 194.8 lb

## 2016-04-15 DIAGNOSIS — R110 Nausea: Secondary | ICD-10-CM

## 2016-04-15 DIAGNOSIS — F418 Other specified anxiety disorders: Secondary | ICD-10-CM | POA: Diagnosis not present

## 2016-04-15 DIAGNOSIS — C9210 Chronic myeloid leukemia, BCR/ABL-positive, not having achieved remission: Secondary | ICD-10-CM

## 2016-04-15 DIAGNOSIS — R21 Rash and other nonspecific skin eruption: Secondary | ICD-10-CM

## 2016-04-15 DIAGNOSIS — D72829 Elevated white blood cell count, unspecified: Secondary | ICD-10-CM | POA: Insufficient documentation

## 2016-04-15 MED ORDER — TRIAMCINOLONE ACETONIDE 0.1 % EX CREA
1.0000 | TOPICAL_CREAM | Freq: Two times a day (BID) | CUTANEOUS | Status: DC
Start: 2016-04-15 — End: 2016-06-11

## 2016-04-15 NOTE — Progress Notes (Signed)
[place] at [facility] Hematology/Oncology Progress Note  Name: Andrea Martinez      MRN: [id number]   Date: 04/15/2016 Time:12:07 PM   REFERRING PHYSICIAN:  Sinda Du, MD   DIAGNOSIS:  CML    HISTORY OF PRESENT ILLNESS:   Andrea Martinez is a 70 year old white American female here for additional f/u of CML  Andrea Martinez returns to the [place] today accompanied by her husband.  She still has her rash. It used to be on the upper part of her back but has now spread to her lower back. She said that it looks like it just keeps going down. The upper part of her back has gotten better and does not bother her anymore. She tries not to scratch it very often but every once in a while she scratches it raw. Her husband puts cream on it for her.   She fell down yesterday. She fell forward and hit the sidewalk and a brick wall. She has bruises on both of her knees, scraped her arms and fingers, her left arm is sore and her elbow is raw. She sat in the recliner last night. She has taken 3 pain pill since she fell.   Her mood has gotten better. She said "I can look at you now and not cry" She said she has not had any crying spells recently.  They have not gone out to eat recently. Her grand daughters 16th birthday is tomorrow and she said she is probably going to go out to eat with them.   Her hair is still coming out and is thinning. She thinks that this is due to stress and not her chemotherapy.   She still gets nauseated. She does not take any nausea medication for this though. She likes to fix things herself. She gets a ginger ale, a cookie or some hard candy and this helps it. She said that if this gets worse she will take her nausea medication.   She has been sleeping pretty good but she has to get up during the night to use the bathroom. This is because she has to drink a glass of water for her chemotherapy pill and she does this at night. Her chemotherapy makes her  nauseous but she has not thrown up.   She tries to eat something before 7:30 in the morning and at night.   She is still able to do what she wants like cleaning bathrooms and laundry.   She goes to a support group every other Monday. She says that this does help her. She has not talked to her family more about her diagnosis.   PAST MEDICAL HISTORY:   Past Medical History  Diagnosis Date  . Gastric ulcer 2011    EGD, 5/11  . Hypothyroidism     not on meds, followed by Dr. Elyse Hsu  . PONV (postoperative nausea and vomiting)   . Lipoma     left upper arm  . UTI (lower urinary tract infection)     frequent  . Leukocytosis 11/20/2015  . 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 Codeine Nausea Only    Headache  . Esomeprazole Magnesium Nausea Only  . Sulfonamide Derivatives Nausea Only    Headache  . Fluconazole Rash    Redness and blistering on left thigh      MEDICATIONS: I have reviewed the patient's  current medications.    Current Outpatient Prescriptions on File Prior to Visit  Medication Sig Dispense Refill  . acetaminophen (TYLENOL) 500 MG tablet Take 500 mg by mouth every 6 (six) hours as needed.    . ALPRAZolam (XANAX) 0.25 MG tablet Take 1 tablet (0.25 mg total) by mouth every 6 (six) hours as needed for anxiety. (Patient not taking: Reported on 03/30/2016) 30 tablet 1  . benzonatate (TESSALON) 200 MG capsule Take 1 capsule (200 mg total) by mouth 3 (three) times daily as needed for cough. (Patient not taking: Reported on 03/30/2016) 45 capsule 2  . cetirizine (ZYRTEC) 10 MG tablet Take 10 mg by mouth as needed for allergies.    . Cholecalciferol (VITAMIN D-3) 5000 UNITS TABS Take 5,000 Units by mouth daily.      . Cholestyramine Light (PREVALITE PO) Take 4 g by mouth as needed. Reported on 02/13/2016    . conjugated estrogens (PREMARIN) vaginal cream Place 1 Applicatorful vaginally 2 (two) times  a week. 1/2 to 1g    . escitalopram (LEXAPRO) 20 MG tablet Take 1 tablet (20 mg total) by mouth daily. 30 tablet 2  . Multiple Vitamins-Minerals (CENTRUM SILVER PO) Take 1 capsule by mouth daily.    . nilotinib (TASIGNA) 150 MG capsule Take 2 capsules (300 mg total) by mouth every 12 (twelve) hours. 120 capsule 11  . ondansetron (ZOFRAN) 8 MG tablet Take 1 tablet (8 mg total) by mouth every 8 (eight) hours as needed for nausea or vomiting. (Patient not taking: Reported on 03/30/2016) 30 tablet 2  . Probiotic Product (DIGESTIVE ADVANTAGE GUMMIES PO) Take 2 tablets by mouth daily.    . prochlorperazine (COMPAZINE) 10 MG tablet Take 1 tablet (10 mg total) by mouth every 6 (six) hours as needed for nausea or vomiting. 60 tablet 2  . promethazine (PHENERGAN) 25 MG tablet Take 1 tablet (25 mg total) by mouth every 6 (six) hours as needed for nausea or vomiting. 60 tablet 2  . traMADol (ULTRAM) 50 MG tablet Take 1 tablet (50 mg total) by mouth every 6 (six) hours as needed. 90 tablet 0  . triamcinolone (NASACORT ALLERGY 24HR) 55 MCG/ACT AERO nasal inhaler Place 2 sprays into the nose as needed.    . triamcinolone cream (KENALOG) 0.1 % Apply 1 application topically 2 (two) times daily. 453.6 g 0  . vitamin B-12 (CYANOCOBALAMIN) 1000 MCG tablet Take 5,000 mcg by mouth daily.      No current facility-administered medications on file prior to visit.     PAST SURGICAL HISTORY Past Surgical History  Procedure Laterality Date  . Esophagogastroduodenoscopy  02/2010    gastric ulcers  . Partial hysterectomy      vaginal at age 89 years of age  . Nose surgery    . Lipoma excision  08/02/2011    left shoulder  . Colonoscopy  01/2004    [facility], few small tics  . Esophagogastroduodenoscopy  12/04/2012    MOL:MBEMLJQ gastritis (inflammation) was found in the gastric antrum; multiple biopsies The duodenal mucosa showed no abnormalities in the bulb and second portion of the duodenum  . Bravo ph study   12/04/2012    Procedure: BRAVO [place];  Surgeon: [name], MD;  [facility];  Service: Endoscopy;  Laterality: N/A;  . Toe debridement  1962    lt great toe  . Cholecystectomy N/A 01/03/2014    Procedure: LAPAROSCOPIC CHOLECYSTECTOMY WITH INTRAOPERATIVE CHOLANGIOGRAM;  Surgeon: Joyice Faster. Cornett, MD;  Location: [place];  Service: General;  Laterality: N/A;    FAMILY HISTORY: Family History  Problem Relation Age of Onset  . Lung cancer Father   . COPD Mother   . Colon cancer Neg Hx   . Anesthesia problems Neg Hx   . Hypotension Neg Hx   . Malignant hyperthermia Neg Hx   . Pseudochol deficiency Neg Hx     SOCIAL HISTORY:  reports that she has never smoked. She has never used smokeless tobacco. She reports that she does not drink alcohol or use illicit drugs.  PERFORMANCE STATUS: The patient's performance status is 1 - Symptomatic but completely ambulatory  REVIEW OF SYSTEMS: Positive for anxiety. Positive for rash.  Lower back down to buttocks. Tries not to scratch it but sometimes scratches it raw. Husband puts cream on it.  Positive for falls. Fell down yesterday. She has bruises on both of her knees, scraped her arms and fingers, her left arm is sore and her elbow is raw. She has taken 3 pain pills since she fell. Positive for nausea. Nausea due to chemotherapy. Does not take medication but has a ginger ale or eats something and this helps. Has not thrown up.  Positive for nocturia.  She drinks water before she goes to sleep because of chemotherapy pill.   14 point review of systems was performed and is negative except as detailed under history of present illness and above   PHYSICAL EXAM: Most Recent Vital Signs: Blood pressure 135/52, pulse 83, temperature 98 F (36.7 C), temperature source Oral, resp. rate 18, weight 194 lb 12.8 oz (88.361 kg), SpO2 95 %. General appearance: alert, cooperative, appears stated age, fatigued, no  distress, moderately obese , anxious, and accompanied by her husband, teary eyed  Head: Normocephalic, without obvious abnormality, atraumatic Eyes: negative findings: lids and lashes normal, conjunctivae and sclerae normal, corneas clear and pupils equal, round, reactive to light and accomodation Throat: lips, mucosa, and tongue normal; teeth and gums normal Neck: no adenopathy and supple, symmetrical, trachea midline Lungs: clear to auscultation bilaterally and normal percussion bilaterally Heart: regular rate and rhythm, S1, S2 normal, no murmur, click, rub or gallop Abdomen: normal findings: bowel sounds normal, liver span normal to percussion, no bruits heard, no masses palpable, no organomegaly, no renal abnormalities palpable, spleen non-palpable and symmetric and abnormal findings:  mild tenderness in the lower abdomen Extremities: Homans sign is negative, no sign of DVT and no edema, redness or tenderness in the calves or thighs Skin: Skin color, texture, turgor normal. Papular erythematous rash R shoulder, R hip, L buttock, excoriations noted Lymph nodes: Cervical, supraclavicular, and axillary nodes normal. Neurologic: Grossly normal   LABORATORY DATA:  I have reviewed the data as listed.   LDH 98 - 192 U/L 141 136 153 163        Magnesium 1.7 - 2.4 mg/dL 2.2 2.2 2.1 2.1        Phosphorus 2.5 - 4.6 mg/dL 3.7 3.7 3.4 3.7       CBC    Component Value Date/Time   WBC 6.9 03/30/2016 0943   RBC 3.56* 03/30/2016 0943   RBC 2.95* 02/06/2016 1238   HGB 10.1* 03/30/2016 0943   HCT 31.7* 03/30/2016 0943   PLT 254 03/30/2016 0943   MCV 89.0 03/30/2016 0943   MCH 28.4 03/30/2016 0943   MCHC 31.9 03/30/2016 0943   RDW 14.9 03/30/2016 0943   LYMPHSABS 1.2 03/30/2016 0943   MONOABS 0.5 03/30/2016 4008  EOSABS 0.2 03/30/2016 0943   BASOSABS 0.0 03/30/2016 0943   CMP     Component Value Date/Time   NA 142 03/30/2016 0943   K 4.2 03/30/2016 0943   CL 107 03/30/2016 0943     CO2 27 03/30/2016 0943   GLUCOSE 118* 03/30/2016 0943   BUN 17 03/30/2016 0943   CREATININE 0.69 03/30/2016 0943   CREATININE 0.87 07/14/2011 0938   CALCIUM 8.9 03/30/2016 0943   PROT 7.3 03/30/2016 0943   ALBUMIN 4.0 03/30/2016 0943   AST 34 03/30/2016 0943   ALT 25 03/30/2016 0943   ALKPHOS 157* 03/30/2016 0943   BILITOT 0.8 03/30/2016 0943   GFRNONAA >60 03/30/2016 0943   GFRAA >60 03/30/2016 0943    PATHOLOGY:        ASSESSMENT/PLAN:  CML Anemia Tasigna 01/06/2016  She is to continue on tasigna as prescribed.  We discussed CML again in detail today. I have encouraged her to continue on her medication and that I believe some of her side effects are secondary to depression. She has finally gone to a support group and actually enjoyed it. She plans on returning next week.   Depression/Adjustment Disorder  She has begun to attend a support group. We have increased her lexapro. She is doing a little better.  Nausea  I have written her for phenergan and compazine to try as needed for her nausea.   EKG change  She has an appointment with cardiology on Tuesday.  RASH  Triamcinolone cream. She is to call if it worsens. Most likely from TKI therapy and mild.  Monitoring Response to TKI therapy  Bone marrow cytogenetics: at diagnosis, failure to reach response milestones and any sign of loss of response (defined as hematologic or cytogenetic relapse)  Quantitative RT-PCR using is preferred: at diagnosis, every 3 months after initiating treatment, after BCR-ABL1 0.1% - , 1% (IS) has been achieved, every 3 months for 2 years and every 3 - 6 months thereafter. If there is 1 log increase in BCR-ABL1 transcript levels with MMR, QPCR should be repeated in 1 - 3 months  BCR-ABL kinase domain mutation analysis: Chronic phase: failure to reach response milestones, any sign of loss of response (defined as hematologic or cytogenetic relapse) 1 - log increase in BCR-ABL1 transcript  levels and loss of MMR. Disease progression to accelerated or blast phase  Criteria for Hematologic and molecular response   Complete Hematologic response Complete normalization of peripheral blood counts with leukocyte count ,10x103/L Platelet count < 450 x 109/L NO immature cells such as myelocytes, promyelocytes or blasts in peripheral blood No signs and symptoms of disease with disappearance of palpable splenomegaly  Molecular response Early molecular response  BCR-ABL1<= 10% (IS) at 3 and 6 months Major molecular response BCR-ABL1,0.1%  (IS) or > = 3 log reduction in BCR-ABL63mNA from the standardized baseline  Complete molecular response no detectable BCR-ABL132mA using a QPCR assay with a sensitivity of at least 4.5 logs below the standardized baseline  All questions were answered. The patient knows to call the clinic with any problems, questions or concerns. We can certainly see the patient much sooner if necessary.  This document serves as a record of services personally performed by ShAncil LinseyMD. It was created on her behalf by MaKandace Blitza trained medical scribe. The creation of this record is based on the scribe's personal observations and the provider's statements to them. This document has been checked and approved by the attending provider.  I  have reviewed the above documentation for accuracy and completeness, and I agree with the above.  This note is electronically signed by:  Molli Hazard, MD  04/15/2016 12:07 PM

## 2016-04-15 NOTE — Patient Instructions (Addendum)
[place] at [facility] Discharge Instructions  RECOMMENDATIONS MADE BY THE CONSULTANT AND ANY TEST RESULTS WILL BE SENT TO YOUR REFERRING PHYSICIAN.    Exam and discussion by Dr Whitney Muse today Return to see the doctor in 2 weeks  Please call the clinic if you have any questions or concerns     Thank you for choosing [place] at [facility] to provide your oncology and hematology care.  To afford each patient quality time with our provider, please arrive at least 15 minutes before your scheduled appointment time.   Beginning January 23rd 2017 lab work for the [facility] will be done in the  Main lab at [facility] on 1st floor. If you have a lab appointment with the [place] please come in thru the  Main Entrance and check in at the main information desk  You need to re-schedule your appointment should you arrive 10 or more minutes late.  We strive to give you quality time with our providers, and arriving late affects you and other patients whose appointments are after yours.  Also, if you no show three or more times for appointments you may be dismissed from the clinic at the providers discretion.     Again, thank you for choosing [facility].  Our hope is that these requests will decrease the amount of time that you wait before being seen by our physicians.       _____________________________________________________________  Should you have questions after your visit to [facility], please contact our office at [phone] between the hours of 8:30 a.m. and 4:30 p.m.  Voicemails left after 4:30 p.m. will not be returned until the following business day.  For prescription refill requests, have your pharmacy contact our office.         Resources For Cancer Patients and their Caregivers ? American Cancer Society: Can assist with transportation, wigs, general needs, runs Look Good Feel Better.         240-836-8129 ? Cancer Care: Provides financial assistance, online support groups, medication/co-pay assistance.  1-800-813-HOPE 930-468-7079) ?  Assists [place] cancer patients and their families through emotional , educational and financial support.  918-348-2339 ? [place] DSS Where to apply for food stamps, Medicaid and utility assistance. 978-073-7605 ? [facility]: Transportation to medical appointments. 6193804432 ? Social Security Administration: May apply for disability if have a Stage IV cancer. 332-050-5132 (563) 623-8419 ? [employer], Disability and Transit Services: Assists with nutrition, care and transit needs. 7125718195

## 2016-04-20 ENCOUNTER — Ambulatory Visit (INDEPENDENT_AMBULATORY_CARE_PROVIDER_SITE_OTHER): Payer: Medicare Other | Admitting: Cardiovascular Disease

## 2016-04-20 ENCOUNTER — Encounter: Payer: Self-pay | Admitting: Cardiovascular Disease

## 2016-04-20 VITALS — BP 140/62 | HR 87 | Ht 66.0 in | Wt 197.0 lb

## 2016-04-20 DIAGNOSIS — R9431 Abnormal electrocardiogram [ECG] [EKG]: Secondary | ICD-10-CM | POA: Diagnosis not present

## 2016-04-20 DIAGNOSIS — R5383 Other fatigue: Secondary | ICD-10-CM | POA: Diagnosis not present

## 2016-04-20 NOTE — Patient Instructions (Signed)
Medication Instructions:  Your physician recommends that you continue on your current medications as directed. Please refer to the Current Medication list given to you today.  Labwork: NONE  Testing/Procedures: NONE  Follow-Up: Your physician wants you to follow-up in: 4 MONTHS WITH DR. KONESWARAN You will receive a reminder letter in the mail two months in advance. If you don't receive a letter, please call our office to schedule the follow-up appointment.  Any Other Special Instructions Will Be Listed Below (If Applicable).  If you need a refill on your cardiac medications before your next appointment, please call your pharmacy. 

## 2016-04-20 NOTE — Progress Notes (Signed)
Patient ID: Andrea Martinez, female   DOB: 04-Apr-1946, 70 y.o.   MRN: [id number]       CARDIOLOGY CONSULT NOTE  Patient ID: Andrea Martinez MRN: [id number] DOB/AGE: 1946-11-04 70 y.o.  Admit date: (Not on file) Primary Physician: [name], MD Referring Physician: Luan Pulling MD  Reason for Consultation: Abnormal ECG  HPI: The patient is a 70 year old woman with a history of CML. She is referred today for the evaluation of an abnormal ECG.   ECG dated 04/06/16 showed sinus rhythm with possible old septal infarct and T-wave inversions in leads V1-V3 suggestive of anterior ischemia.  I reviewed prior ECGs dating back to 2012. ECG on 07/29/11 showed no T-wave inversions nor did ECG performed on 12/10/15 or 01/08/16. ECG on 01/29/16 did show T-wave inversions in leads V1 and V2. There were no T-wave inversions on subsequent ECG on 02/13/16.  She denies exertional chest pain and shortness of breath as well as palpitations at rest. She said due to her low counts she has not been able to get much physical activity and has felt fatigued and weak. She said "I have big bones" and says she has had ankle and feet swelling for years and says it is hereditary.  Labs dated 03/30/16 showed hemoglobin 10.1, white blood cells 6.9, platelets 254.  She denies a personal history of hypertension, hyperlipidemia, tobacco abuse, and diabetes.   Allergies  Allergen Reactions  . Codeine Nausea Only    Headache  . Esomeprazole Magnesium Nausea Only  . Sulfonamide Derivatives Nausea Only    Headache  . Fluconazole Rash    Redness and blistering on left thigh    Current Outpatient Prescriptions  Medication Sig Dispense Refill  . acetaminophen (TYLENOL) 500 MG tablet Take 500 mg by mouth every 6 (six) hours as needed.    . ALPRAZolam (XANAX) 0.25 MG tablet Take 1 tablet (0.25 mg total) by mouth every 6 (six) hours as needed for anxiety. 30 tablet 1  . benzonatate (TESSALON) 200 MG capsule Take 1 capsule (200  mg total) by mouth 3 (three) times daily as needed for cough. 45 capsule 2  . cetirizine (ZYRTEC) 10 MG tablet Take 10 mg by mouth as needed for allergies.    . Cholecalciferol (VITAMIN D-3) 5000 UNITS TABS Take 5,000 Units by mouth daily.      . Cholestyramine Light (PREVALITE PO) Take 4 g by mouth as needed. Reported on 02/13/2016    . conjugated estrogens (PREMARIN) vaginal cream Place 1 Applicatorful vaginally 2 (two) times a week. 1/2 to 1g    . escitalopram (LEXAPRO) 20 MG tablet Take 1 tablet (20 mg total) by mouth daily. 30 tablet 2  . Multiple Vitamins-Minerals (CENTRUM SILVER PO) Take 1 capsule by mouth daily.    . nilotinib (TASIGNA) 150 MG capsule Take 2 capsules (300 mg total) by mouth every 12 (twelve) hours. 120 capsule 11  . ondansetron (ZOFRAN) 8 MG tablet Take 1 tablet (8 mg total) by mouth every 8 (eight) hours as needed for nausea or vomiting. 30 tablet 2  . Probiotic Product (DIGESTIVE ADVANTAGE GUMMIES PO) Take 2 tablets by mouth daily.    . prochlorperazine (COMPAZINE) 10 MG tablet Take 1 tablet (10 mg total) by mouth every 6 (six) hours as needed for nausea or vomiting. 60 tablet 2  . promethazine (PHENERGAN) 25 MG tablet Take 1 tablet (25 mg total) by mouth every 6 (six) hours as needed for nausea or vomiting. 60 tablet 2  . traMADol (  ULTRAM) 50 MG tablet Take 1 tablet (50 mg total) by mouth every 6 (six) hours as needed. 90 tablet 0  . triamcinolone (NASACORT ALLERGY 24HR) 55 MCG/ACT AERO nasal inhaler Place 2 sprays into the nose as needed.    . triamcinolone cream (KENALOG) 0.1 % Apply 1 application topically 2 (two) times daily. 453.6 g 0  . triamcinolone cream (KENALOG) 0.1 % Apply 1 application topically 2 (two) times daily. 80 g 1  . vitamin B-12 (CYANOCOBALAMIN) 1000 MCG tablet Take 5,000 mcg by mouth daily.      No current facility-administered medications for this visit.    Past Medical History  Diagnosis Date  . Gastric ulcer 2011    EGD, 5/11  .  Hypothyroidism     not on meds, followed by Dr. Elyse Hsu  . PONV (postoperative nausea and vomiting)   . Lipoma     left upper arm  . UTI (lower urinary tract infection)     frequent  . Leukocytosis 11/20/2015  . Chronic myelogenous leukemia (CML), BCR-ABL1-positive ([place]) 11/20/2015    Past Surgical History  Procedure Laterality Date  . Esophagogastroduodenoscopy  02/2010    gastric ulcers  . Partial hysterectomy      vaginal at age 43 years of age  . Nose surgery    . Lipoma excision  08/02/2011    left shoulder  . Colonoscopy  01/2004    DR [facility], few small tics  . Esophagogastroduodenoscopy  12/04/2012    [id number] gastritis (inflammation) was found in the gastric antrum; multiple biopsies The duodenal mucosa showed no abnormalities in the bulb and second portion of the duodenum  . Bravo ph study  12/04/2012    Procedure: BRAVO [place];  Surgeon: [name], MD;  [facility];  Service: Endoscopy;  Laterality: N/A;  . Toe debridement  1962    lt great toe  . Cholecystectomy N/A 01/03/2014    Procedure: LAPAROSCOPIC CHOLECYSTECTOMY WITH INTRAOPERATIVE CHOLANGIOGRAM;  Surgeon: Joyice Faster. Cornett, MD;  Location: [facility];  Service: General;  Laterality: N/A;    Social History   Social History  . Marital Status: Married    Spouse Name: N/A  . Number of Children: 2  . Years of Education: 12th grade   Occupational History  . retired   .     Social History Main Topics  . Smoking status: Never Smoker   . Smokeless tobacco: Never Used  . Alcohol Use: No  . Drug Use: No  . Sexual Activity:    Partners: Male    Birth Control/ Protection: None   Other Topics Concern  . Not on file   Social History Narrative     No family history of premature CAD in 1st degree relatives.  Prior to Admission medications   Medication Sig Start Date End Date Taking? Authorizing Provider  acetaminophen (TYLENOL) 500 MG tablet Take 500 mg by mouth every 6  (six) hours as needed.   Yes Historical Provider, MD  ALPRAZolam (XANAX) 0.25 MG tablet Take 1 tablet (0.25 mg total) by mouth every 6 (six) hours as needed for anxiety. 01/01/16  Yes Patrici Ranks, MD  benzonatate (TESSALON) 200 MG capsule Take 1 capsule (200 mg total) by mouth 3 (three) times daily as needed for cough. 01/29/16  Yes Baird Cancer, PA-C  cetirizine (ZYRTEC) 10 MG tablet Take 10 mg by mouth as needed for allergies.   Yes Historical Provider, MD  Cholecalciferol (VITAMIN D-3) 5000 UNITS  TABS Take 5,000 Units by mouth daily.     Yes Historical Provider, MD  Cholestyramine Light (PREVALITE PO) Take 4 g by mouth as needed. Reported on 02/13/2016   Yes Historical Provider, MD  conjugated estrogens (PREMARIN) vaginal cream Place 1 Applicatorful vaginally 2 (two) times a week. 1/2 to 1g   Yes Historical Provider, MD  escitalopram (LEXAPRO) 20 MG tablet Take 1 tablet (20 mg total) by mouth daily. 03/30/16  Yes Patrici Ranks, MD  Multiple Vitamins-Minerals (CENTRUM SILVER PO) Take 1 capsule by mouth daily.   Yes Historical Provider, MD  nilotinib (TASIGNA) 150 MG capsule Take 2 capsules (300 mg total) by mouth every 12 (twelve) hours. 02/13/16  Yes Patrici Ranks, MD  ondansetron (ZOFRAN) 8 MG tablet Take 1 tablet (8 mg total) by mouth every 8 (eight) hours as needed for nausea or vomiting. 12/31/15  Yes Patrici Ranks, MD  Probiotic Product (DIGESTIVE ADVANTAGE GUMMIES PO) Take 2 tablets by mouth daily.   Yes Historical Provider, MD  prochlorperazine (COMPAZINE) 10 MG tablet Take 1 tablet (10 mg total) by mouth every 6 (six) hours as needed for nausea or vomiting. 03/30/16  Yes Patrici Ranks, MD  promethazine (PHENERGAN) 25 MG tablet Take 1 tablet (25 mg total) by mouth every 6 (six) hours as needed for nausea or vomiting. 03/30/16  Yes Patrici Ranks, MD  traMADol (ULTRAM) 50 MG tablet Take 1 tablet (50 mg total) by mouth every 6 (six) hours as needed. 04/06/16  Yes Patrici Ranks, MD  triamcinolone (NASACORT ALLERGY 24HR) 55 MCG/ACT AERO nasal inhaler Place 2 sprays into the nose as needed.   Yes Historical Provider, MD  triamcinolone cream (KENALOG) 0.1 % Apply 1 application topically 2 (two) times daily. 01/26/16  Yes Patrici Ranks, MD  triamcinolone cream (KENALOG) 0.1 % Apply 1 application topically 2 (two) times daily. 04/15/16  Yes Patrici Ranks, MD  vitamin B-12 (CYANOCOBALAMIN) 1000 MCG tablet Take 5,000 mcg by mouth daily.    Yes Historical Provider, MD     Review of systems complete and found to be negative unless listed above in HPI     Physical exam Blood pressure 140/62, pulse 87, height 5\' 6"  (1.676 m), weight 197 lb (89.359 kg), SpO2 97 %. General: NAD Neck: No JVD, no thyromegaly or thyroid nodule.  Lungs: Clear to auscultation bilaterally with normal respiratory effort. CV: Nondisplaced PMI. Regular rate and rhythm, normal S1/S2, no S3/S4, no murmur.  Trace dorsal pedal and periankle edema.  No carotid bruit.    Abdomen: Soft, nontender, obese.  Neurologic: Alert and oriented x 3.  Psych: Normal affect. Extremities: No clubbing or cyanosis.  HEENT: Normal.   ECG: Most recent ECG reviewed.  Labs:   Lab Results  Component Value Date   WBC 6.9 03/30/2016   HGB 10.1* 03/30/2016   HCT 31.7* 03/30/2016   MCV 89.0 03/30/2016   PLT 254 03/30/2016   No results for input(s): NA, K, CL, CO2, BUN, CREATININE, CALCIUM, PROT, BILITOT, ALKPHOS, ALT, AST, GLUCOSE in the last 168 hours.  Invalid input(s): LABALBU No results found for: CKTOTAL, CKMB, CKMBINDEX, TROPONINI No results found for: CHOL No results found for: HDL No results found for: LDLCALC No results found for: TRIG No results found for: CHOLHDL No results found for: LDLDIRECT       Studies: No results found.  ASSESSMENT AND PLAN:  1. Abnormal ECG with weakness/fatigue: At the present time she denies chest pain and  shortness of breath. She has felt fatigued with  treatment for CML. She was told her symptoms would gradually improve. She has no traditional risk factors for coronary disease including hypertension, hyperlipidemia, diabetes, tobacco abuse, nor family history of premature coronary artery disease. If her symptoms do not improve by the time of her next appointment, I would consider nuclear myocardial perfusion imaging. At the present time I will continue clinical surveillance. She has been informed to contact me should she develop any of these aforementioned symptoms.  Dispo: fu 4 months.   Signed: Kate Sable, M.D., F.A.C.C.  04/20/2016, 9:33 AM

## 2016-04-30 ENCOUNTER — Encounter (HOSPITAL_COMMUNITY): Payer: Medicare Other

## 2016-04-30 ENCOUNTER — Encounter (HOSPITAL_BASED_OUTPATIENT_CLINIC_OR_DEPARTMENT_OTHER): Payer: Medicare Other | Admitting: Hematology & Oncology

## 2016-04-30 VITALS — BP 132/45 | HR 79 | Temp 98.4°F | Resp 20 | Wt 196.4 lb

## 2016-04-30 DIAGNOSIS — D72829 Elevated white blood cell count, unspecified: Secondary | ICD-10-CM | POA: Diagnosis not present

## 2016-04-30 DIAGNOSIS — M2550 Pain in unspecified joint: Secondary | ICD-10-CM | POA: Diagnosis not present

## 2016-04-30 DIAGNOSIS — M25559 Pain in unspecified hip: Secondary | ICD-10-CM

## 2016-04-30 DIAGNOSIS — E039 Hypothyroidism, unspecified: Secondary | ICD-10-CM

## 2016-04-30 DIAGNOSIS — C9210 Chronic myeloid leukemia, BCR/ABL-positive, not having achieved remission: Secondary | ICD-10-CM | POA: Diagnosis not present

## 2016-04-30 DIAGNOSIS — F329 Major depressive disorder, single episode, unspecified: Secondary | ICD-10-CM | POA: Diagnosis not present

## 2016-04-30 LAB — CBC WITH DIFFERENTIAL/PLATELET
BASOS ABS: 0 10*3/uL (ref 0.0–0.1)
BASOS PCT: 0 %
Eosinophils Absolute: 0.2 10*3/uL (ref 0.0–0.7)
Eosinophils Relative: 2 %
HEMATOCRIT: 34.1 % — AB (ref 36.0–46.0)
HEMOGLOBIN: 10.6 g/dL — AB (ref 12.0–15.0)
Lymphocytes Relative: 18 %
Lymphs Abs: 1.5 10*3/uL (ref 0.7–4.0)
MCH: 26 pg (ref 26.0–34.0)
MCHC: 31.1 g/dL (ref 30.0–36.0)
MCV: 83.6 fL (ref 78.0–100.0)
Monocytes Absolute: 0.7 10*3/uL (ref 0.1–1.0)
Monocytes Relative: 8 %
NEUTROS ABS: 5.7 10*3/uL (ref 1.7–7.7)
NEUTROS PCT: 72 %
Platelets: 257 10*3/uL (ref 150–400)
RBC: 4.08 MIL/uL (ref 3.87–5.11)
RDW: 14.4 % (ref 11.5–15.5)
WBC: 8 10*3/uL (ref 4.0–10.5)

## 2016-04-30 LAB — COMPREHENSIVE METABOLIC PANEL
ALBUMIN: 4.1 g/dL (ref 3.5–5.0)
ALK PHOS: 170 U/L — AB (ref 38–126)
ALT: 31 U/L (ref 14–54)
AST: 36 U/L (ref 15–41)
Anion gap: 6 (ref 5–15)
BILIRUBIN TOTAL: 0.7 mg/dL (ref 0.3–1.2)
BUN: 21 mg/dL — AB (ref 6–20)
CO2: 28 mmol/L (ref 22–32)
CREATININE: 0.6 mg/dL (ref 0.44–1.00)
Calcium: 9.2 mg/dL (ref 8.9–10.3)
Chloride: 105 mmol/L (ref 101–111)
GFR calc Af Amer: >60 mL/min (ref >60)
GFR calc non Af Amer: >60 mL/min (ref >60)
GLUCOSE: 110 mg/dL — AB (ref 65–99)
POTASSIUM: 4.2 mmol/L (ref 3.5–5.1)
Sodium: 139 mmol/L (ref 135–145)
TOTAL PROTEIN: 7.5 g/dL (ref 6.5–8.1)

## 2016-04-30 LAB — PHOSPHORUS: Phosphorus: 3.7 mg/dL (ref 2.5–4.6)

## 2016-04-30 LAB — SEDIMENTATION RATE: Sed Rate: 90 mm/hr — ABNORMAL HIGH (ref 0–22)

## 2016-04-30 NOTE — Progress Notes (Signed)
[facility] [address] Hematology/Oncology Progress Note  Name: Andrea Martinez      MRN: [id number]   Date: 05/01/2016 Time:7:29 PM   REFERRING PHYSICIAN:  Sinda Du, MD   DIAGNOSIS:  CML    HISTORY OF PRESENT ILLNESS:   Andrea Martinez is a 70 year old white American female here for additional f/u of CML  [name] returns to the [place] today accompanied by her husband.  She says that her blood pressure is kind of low, that she's tired, and that she doesn't have any "get up and go." In spite of this, she notes that she is still cleaning her house; able to get up and do those things physically; but adds sometimes she looks at the paper and nods off. She notes that she hasn't been outside to do anything yet, and hasn't been out walking every day either. She says "I just got so weak feeling, I'm afraid to do anything." When asked about therapy as an option she says "No, I don't think so." She has not fallen again.  When asked about her appetite, she says it's not really good. She says she now goes to the bathroom once or twice a day, and that her irritable bowel is doing much better. She also reports that she hasn't touched her nausea pills. She also says "I haven't had a tylenol in months."  She says that the cream "does the trick" when it comes to helping her rash. She says it's not getting worse; her husband continues to put the cream on it; seems to be improving  [name] confirms going to the support group. They gather every other week and she has been to two of them so far, and plans to continue going.  Dr. Luan Pulling is her PCP. She notes that she's never been on blood pressure medications.  She is interesting in following up about rheumatoid arthritis, though in the past she was checked out for a great deal of things that came back negative/inconclusive.  She notes that she's been on her chemo 4 months and 1 week. She still has not told all of  her family members about her diagnosis.   PAST MEDICAL HISTORY:   Past Medical History  Diagnosis Date  . Gastric ulcer 2011    EGD, 5/11  . Hypothyroidism     not on meds, followed by Dr. Elyse Hsu  . PONV (postoperative nausea and vomiting)   . Lipoma     left upper arm  . UTI (lower urinary tract infection)     frequent  . Leukocytosis 11/20/2015  . Chronic myelogenous leukemia (CML), BCR-ABL1-positive ([place]) 11/20/2015    ALLERGIES: Allergies  Allergen Reactions  . Codeine Nausea Only    Headache  . Esomeprazole Magnesium Nausea Only  . Sulfonamide Derivatives Nausea Only    Headache  . Fluconazole Rash    Redness and blistering on left thigh      MEDICATIONS: I have reviewed the patient's current medications.    Current Outpatient Prescriptions on File Prior to Visit  Medication Sig Dispense Refill  . acetaminophen (TYLENOL) 500 MG tablet Take 500 mg by mouth every 6 (six) hours as needed.    . ALPRAZolam (XANAX) 0.25 MG tablet Take 1 tablet (0.25 mg total) by mouth every 6 (six) hours as needed for anxiety. 30 tablet 1  . benzonatate (TESSALON) 200 MG capsule Take 1 capsule (200 mg total) by mouth 3 (  three) times daily as needed for cough. 45 capsule 2  . cetirizine (ZYRTEC) 10 MG tablet Take 10 mg by mouth as needed for allergies.    . Cholecalciferol (VITAMIN D-3) 5000 UNITS TABS Take 5,000 Units by mouth daily.      . Cholestyramine Light (PREVALITE PO) Take 4 g by mouth as needed. Reported on 02/13/2016    . conjugated estrogens (PREMARIN) vaginal cream Place 1 Applicatorful vaginally 2 (two) times a week. 1/2 to 1g    . escitalopram (LEXAPRO) 20 MG tablet Take 1 tablet (20 mg total) by mouth daily. 30 tablet 2  . Multiple Vitamins-Minerals (CENTRUM SILVER PO) Take 1 capsule by mouth daily.    . nilotinib (TASIGNA) 150 MG capsule Take 2 capsules (300 mg total) by mouth every 12 (twelve) hours. 120 capsule 11  . ondansetron (ZOFRAN) 8 MG tablet Take 1 tablet (8 mg  total) by mouth every 8 (eight) hours as needed for nausea or vomiting. 30 tablet 2  . Probiotic Product (DIGESTIVE ADVANTAGE GUMMIES PO) Take 2 tablets by mouth daily.    . prochlorperazine (COMPAZINE) 10 MG tablet Take 1 tablet (10 mg total) by mouth every 6 (six) hours as needed for nausea or vomiting. 60 tablet 2  . promethazine (PHENERGAN) 25 MG tablet Take 1 tablet (25 mg total) by mouth every 6 (six) hours as needed for nausea or vomiting. 60 tablet 2  . traMADol (ULTRAM) 50 MG tablet Take 1 tablet (50 mg total) by mouth every 6 (six) hours as needed. 90 tablet 0  . triamcinolone (NASACORT ALLERGY 24HR) 55 MCG/ACT AERO nasal inhaler Place 2 sprays into the nose as needed.    . triamcinolone cream (KENALOG) 0.1 % Apply 1 application topically 2 (two) times daily. 453.6 g 0  . triamcinolone cream (KENALOG) 0.1 % Apply 1 application topically 2 (two) times daily. 80 g 1  . vitamin B-12 (CYANOCOBALAMIN) 1000 MCG tablet Take 5,000 mcg by mouth daily.      No current facility-administered medications on file prior to visit.     PAST SURGICAL HISTORY Past Surgical History  Procedure Laterality Date  . Esophagogastroduodenoscopy  02/2010    gastric ulcers  . Partial hysterectomy      vaginal at age 72 years of age  . Nose surgery    . Lipoma excision  08/02/2011    left shoulder  . Colonoscopy  01/2004    DR [facility], few small tics  . Esophagogastroduodenoscopy  12/04/2012    [id number] gastritis (inflammation) was found in the gastric antrum; multiple biopsies The duodenal mucosa showed no abnormalities in the bulb and second portion of the duodenum  . Bravo ph study  12/04/2012    Procedure: BRAVO [place];  Surgeon: [name], MD;  [facility];  Service: Endoscopy;  Laterality: N/A;  . Toe debridement  1962    lt great toe  . Cholecystectomy N/A 01/03/2014    Procedure: LAPAROSCOPIC CHOLECYSTECTOMY WITH INTRAOPERATIVE CHOLANGIOGRAM;  Surgeon: Joyice Faster. Cornett, MD;   Location: [place];  Service: General;  Laterality: N/A;    FAMILY HISTORY: Family History  Problem Relation Age of Onset  . Lung cancer Father   . COPD Mother   . Colon cancer Neg Hx   . Anesthesia problems Neg Hx   . Hypotension Neg Hx   . Malignant hyperthermia Neg Hx   . Pseudochol deficiency Neg Hx     SOCIAL HISTORY:  reports that she has never  smoked. She has never used smokeless tobacco. She reports that she does not drink alcohol or use illicit drugs.  PERFORMANCE STATUS: The patient's performance status is 1 - Symptomatic but completely ambulatory  REVIEW OF SYSTEMS: Positive for anxiety. Positive for rash.  Lower back down to buttocks. Tries not to scratch it but sometimes scratches it raw. Husband puts cream on it. improving Positive for falls. Positive for nausea. Nausea due to chemotherapy. Does not take medication but has a ginger ale or eats something and this helps. Has not thrown up.  Positive for nocturia.  She drinks water before she goes to sleep because of chemotherapy pill.  14 point review of systems was performed and is negative except as detailed under history of present illness and above    PHYSICAL EXAM: Most Recent Vital Signs: Blood pressure 132/45, pulse 79, temperature 98.4 F (36.9 C), temperature source Oral, resp. rate 20, weight 196 lb 6.4 oz (89.086 kg), SpO2 97 %. General appearance: alert, cooperative, appears stated age, fatigued, no distress, moderately obese , anxious, and accompanied by her husband Head: Normocephalic, without obvious abnormality, atraumatic Eyes: negative findings: lids and lashes normal, conjunctivae and sclerae normal, corneas clear and pupils equal, round, reactive to light and accomodation Throat: lips, mucosa, and tongue normal; teeth and gums normal Neck: no adenopathy and supple, symmetrical, trachea midline Lungs: clear to auscultation bilaterally and normal percussion bilaterally Heart:  regular rate and rhythm, S1, S2 normal, no murmur, click, rub or gallop Abdomen: normal findings: bowel sounds normal, liver span normal to percussion, no bruits heard, no masses palpable, no organomegaly, no renal abnormalities palpable, spleen non-palpable and symmetric and abnormal findings:  mild tenderness in the lower abdomen Extremities: Homans sign is negative, no sign of DVT and no edema, redness or tenderness in the calves or thighs Skin: Skin color, texture, turgor normal.  Lymph nodes: Cervical, supraclavicular, and axillary nodes normal. Neurologic: Grossly normal   LABORATORY DATA:  I have reviewed the data as listed.   LDH 98 - 192 U/L 141 136 153 163        Magnesium 1.7 - 2.4 mg/dL 2.2 2.2 2.1 2.1        Phosphorus 2.5 - 4.6 mg/dL 3.7 3.7 3.4 3.7       CBC    Component Value Date/Time   WBC 8.0 04/30/2016 1228   RBC 4.08 04/30/2016 1228   RBC 2.95* 02/06/2016 1238   HGB 10.6* 04/30/2016 1228   HCT 34.1* 04/30/2016 1228   PLT 257 04/30/2016 1228   MCV 83.6 04/30/2016 1228   MCH 26.0 04/30/2016 1228   MCHC 31.1 04/30/2016 1228   RDW 14.4 04/30/2016 1228   LYMPHSABS 1.5 04/30/2016 1228   MONOABS 0.7 04/30/2016 1228   EOSABS 0.2 04/30/2016 1228   BASOSABS 0.0 04/30/2016 1228   CMP     Component Value Date/Time   NA 139 04/30/2016 1228   K 4.2 04/30/2016 1228   CL 105 04/30/2016 1228   CO2 28 04/30/2016 1228   GLUCOSE 110* 04/30/2016 1228   BUN 21* 04/30/2016 1228   CREATININE 0.60 04/30/2016 1228   CREATININE 0.87 07/14/2011 0938   CALCIUM 9.2 04/30/2016 1228   PROT 7.5 04/30/2016 1228   ALBUMIN 4.1 04/30/2016 1228   AST 36 04/30/2016 1228   ALT 31 04/30/2016 1228   ALKPHOS 170* 04/30/2016 1228   BILITOT 0.7 04/30/2016 1228   GFRNONAA >60 04/30/2016 1228   GFRAA >60 04/30/2016 1228   Results for Clear,  EDILIA GHUMAN (MRN [id number]) as of 05/01/2016 19:29  Ref. Range 04/30/2016 12:28  Phosphorus Latest Ref Range: 2.5-4.6 mg/dL 3.7   PATHOLOGY:         ASSESSMENT/PLAN:  CML Anemia Tasigna 01/06/2016  She is to continue on tasigna as prescribed.  We discussed CML again in detail today. I have encouraged her to continue on her medication and that I believe some of her side effects are secondary to depression. She has finally gone to a support group and actually enjoyed it. She plans on returning. She will be due for PCR again in July.   H/H continue to improve.   I am referring her to "cancer rehab." I feel she would greatly benefit from gait assessment and strength training.   Depression/Adjustment Disorder  She has begun to attend a support group. We have increased her lexapro. She is doing a little better.  Nausea  I have written her for phenergan and compazine to try as needed for her nausea.   EKG change  She has seen cardiology and will follow-up in one month.   RASH  Triamcinolone cream.  Most likely from TKI therapy and mild.  Joint Pain  I have checked ESR and RF per patients request. She has seen rheumatology in the past. Will keep her apprised of the results when available.   Monitoring Response to TKI therapy  Bone marrow cytogenetics: at diagnosis, failure to reach response milestones and any sign of loss of response (defined as hematologic or cytogenetic relapse)  Quantitative RT-PCR using is preferred: at diagnosis, every 3 months after initiating treatment, after BCR-ABL1 0.1% - , 1% (IS) has been achieved, every 3 months for 2 years and every 3 - 6 months thereafter. If there is 1 log increase in BCR-ABL1 transcript levels with MMR, QPCR should be repeated in 1 - 3 months  BCR-ABL kinase domain mutation analysis: Chronic phase: failure to reach response milestones, any sign of loss of response (defined as hematologic or cytogenetic relapse) 1 - log increase in BCR-ABL1 transcript levels and loss of MMR. Disease progression to accelerated or blast phase  Criteria for Hematologic and molecular response    Complete Hematologic response Complete normalization of peripheral blood counts with leukocyte count ,10x103/L Platelet count < 450 x 109/L NO immature cells such as myelocytes, promyelocytes or blasts in peripheral blood No signs and symptoms of disease with disappearance of palpable splenomegaly  Molecular response Early molecular response  BCR-ABL1<= 10% (IS) at 3 and 6 months Major molecular response BCR-ABL1,0.1%  (IS) or > = 3 log reduction in BCR-ABL38mNA from the standardized baseline  Complete molecular response no detectable BCR-ABL193mA using a QPCR assay with a sensitivity of at least 4.5 logs below the standardized baseline   Orders Placed This Encounter  Procedures  . CBC with Differential  . Comprehensive metabolic panel  . Phosphorus  . Rheumatoid factor  . Sedimentation rate    All questions were answered. The patient knows to call the clinic with any problems, questions or concerns. We can certainly see the patient much sooner if necessary.  This document serves as a record of services personally performed by ShAncil LinseyMD. It was created on her behalf by KaToni Amenda trained medical scribe. The creation of this record is based on the scribe's personal observations and the provider's statements to them. This document has been checked and approved by the attending provider.  I have reviewed the above documentation for accuracy and completeness, and I agree  with the above.  This note is electronically signed by:  Molli Hazard, MD  05/01/2016 7:29 PM

## 2016-04-30 NOTE — Patient Instructions (Signed)
[place] at [facility] Discharge Instructions  RECOMMENDATIONS MADE BY THE CONSULTANT AND ANY TEST RESULTS WILL BE SENT TO YOUR REFERRING PHYSICIAN.  You will return one more time in 2 weeks to see how you are I have ordered cancer rehab for strength training Labs today, we will call you with the results   Thank you for choosing [place] at [facility] to provide your oncology and hematology care.  To afford each patient quality time with our provider, please arrive at least 15 minutes before your scheduled appointment time.   Beginning January 23rd 2017 lab work for the [facility] will be done in the  Main lab at [facility] on 1st floor. If you have a lab appointment with the [place] please come in thru the  Main Entrance and check in at the main information desk  You need to re-schedule your appointment should you arrive 10 or more minutes late.  We strive to give you quality time with our providers, and arriving late affects you and other patients whose appointments are after yours.  Also, if you no show three or more times for appointments you may be dismissed from the clinic at the providers discretion.     Again, thank you for choosing [facility].  Our hope is that these requests will decrease the amount of time that you wait before being seen by our physicians.       _____________________________________________________________  Should you have questions after your visit to [facility], please contact our office at [phone] between the hours of 8:30 a.m. and 4:30 p.m.  Voicemails left after 4:30 p.m. will not be returned until the following business day.  For prescription refill requests, have your pharmacy contact our office.         Resources For Cancer Patients and their Caregivers ? American Cancer Society: Can assist with transportation, wigs, general needs, runs Look Good Feel  Better.        [phone] ? Cancer Care: Provides financial assistance, online support groups, medication/co-pay assistance.  1-800-813-HOPE 216-517-1946) ? [facility] Assists [place] cancer patients and their families through emotional , educational and financial support.  (310) 746-7926 ? [place] DSS Where to apply for food stamps, Medicaid and utility assistance. 380-081-8992 ? RCATS: Transportation to medical appointments. 214-861-4396 ? Social Security Administration: May apply for disability if have a Stage IV cancer. (613)363-1537 505-868-6311 ? [employer], Disability and Transit Services: Assists with nutrition, care and transit needs. [place] Support Programs: @10RELATIVEDAYS @ > Cancer Support Group  2nd Tuesday of the month 1pm-2pm, Journey Room  > Creative Journey  3rd Tuesday of the month 1130am-1pm, Journey Room  > Look Good Feel Better  1st Wednesday of the month 10am-12 noon, Journey Room (Call [place] to register [phone])

## 2016-05-01 ENCOUNTER — Encounter (HOSPITAL_COMMUNITY): Payer: Self-pay | Admitting: Hematology & Oncology

## 2016-05-01 LAB — RHEUMATOID FACTOR

## 2016-05-14 ENCOUNTER — Encounter (HOSPITAL_COMMUNITY): Payer: Medicare Other | Attending: Oncology | Admitting: Oncology

## 2016-05-14 ENCOUNTER — Encounter (HOSPITAL_COMMUNITY): Payer: Medicare Other

## 2016-05-14 VITALS — BP 125/54 | HR 82 | Temp 98.2°F | Resp 20 | Wt 194.3 lb

## 2016-05-14 DIAGNOSIS — D72829 Elevated white blood cell count, unspecified: Secondary | ICD-10-CM | POA: Insufficient documentation

## 2016-05-14 DIAGNOSIS — C9210 Chronic myeloid leukemia, BCR/ABL-positive, not having achieved remission: Secondary | ICD-10-CM | POA: Diagnosis not present

## 2016-05-14 DIAGNOSIS — D649 Anemia, unspecified: Secondary | ICD-10-CM

## 2016-05-14 LAB — CBC WITH DIFFERENTIAL/PLATELET
Basophils Absolute: 0 10*3/uL (ref 0.0–0.1)
Basophils Relative: 0 %
Eosinophils Absolute: 0.2 10*3/uL (ref 0.0–0.7)
Eosinophils Relative: 2 %
HEMATOCRIT: 32.1 % — AB (ref 36.0–46.0)
Hemoglobin: 10.1 g/dL — ABNORMAL LOW (ref 12.0–15.0)
LYMPHS ABS: 1.2 10*3/uL (ref 0.7–4.0)
LYMPHS PCT: 16 %
MCH: 26.1 pg (ref 26.0–34.0)
MCHC: 31.5 g/dL (ref 30.0–36.0)
MCV: 82.9 fL (ref 78.0–100.0)
MONO ABS: 0.6 10*3/uL (ref 0.1–1.0)
MONOS PCT: 8 %
NEUTROS ABS: 5.5 10*3/uL (ref 1.7–7.7)
Neutrophils Relative %: 74 %
Platelets: 272 10*3/uL (ref 150–400)
RBC: 3.87 MIL/uL (ref 3.87–5.11)
RDW: 14.7 % (ref 11.5–15.5)
WBC: 7.4 10*3/uL (ref 4.0–10.5)

## 2016-05-14 LAB — COMPREHENSIVE METABOLIC PANEL
ALT: 30 U/L (ref 14–54)
ANION GAP: 6 (ref 5–15)
AST: 33 U/L (ref 15–41)
Albumin: 4.1 g/dL (ref 3.5–5.0)
Alkaline Phosphatase: 177 U/L — ABNORMAL HIGH (ref 38–126)
BILIRUBIN TOTAL: 0.6 mg/dL (ref 0.3–1.2)
BUN: 19 mg/dL (ref 6–20)
CALCIUM: 9.3 mg/dL (ref 8.9–10.3)
CO2: 29 mmol/L (ref 22–32)
Chloride: 105 mmol/L (ref 101–111)
Creatinine, Ser: 0.66 mg/dL (ref 0.44–1.00)
GFR calc Af Amer: >60 mL/min (ref >60)
Glucose, Bld: 102 mg/dL — ABNORMAL HIGH (ref 65–99)
POTASSIUM: 4.4 mmol/L (ref 3.5–5.1)
Sodium: 140 mmol/L (ref 135–145)
TOTAL PROTEIN: 7.5 g/dL (ref 6.5–8.1)

## 2016-05-14 LAB — PHOSPHORUS: PHOSPHORUS: 3.6 mg/dL (ref 2.5–4.6)

## 2016-05-14 LAB — LACTATE DEHYDROGENASE: LDH: 148 U/L (ref 98–192)

## 2016-05-14 LAB — MAGNESIUM: Magnesium: 2.1 mg/dL (ref 1.7–2.4)

## 2016-05-14 NOTE — Assessment & Plan Note (Addendum)
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  Currently on Tasigna 150 mg PO BID beginning on 01/02/2016 with an excellent response thus far.  Oncology history is updated.  I personally reviewed and went over laboratory results with the patient.  The results are noted within this dictation.   Labs every 2 weeks: CBC diff, CMET, LDH, Mg, Phosphorus  She has an appt on 05/25/2016 for [facility] evaluation (Cancer Rehab).  She is encouraged to continue with group therapy.  She is to continue with Lexapro as prescribed.  Compliance with Tasigna is again reviewed.  Return in 4 weeks for follow-up.

## 2016-05-14 NOTE — Patient Instructions (Addendum)
[place] at [facility] Discharge Instructions  RECOMMENDATIONS MADE BY THE CONSULTANT AND ANY TEST RESULTS WILL BE SENT TO YOUR REFERRING PHYSICIAN.  Labs today  Labs 2 + 4 weeks for labs  Return 4 weeks f/u with doctor  BCR/ABL lab testing in July 2017    Thank you for choosing [place] at [facility] to provide your oncology and hematology care.  To afford each patient quality time with our provider, please arrive at least 15 minutes before your scheduled appointment time.   Beginning January 23rd 2017 lab work for the [facility] will be done in the  Main lab at [facility] on 1st floor. If you have a lab appointment with the [place] please come in thru the  Main Entrance and check in at the main information desk  You need to re-schedule your appointment should you arrive 10 or more minutes late.  We strive to give you quality time with our providers, and arriving late affects you and other patients whose appointments are after yours.  Also, if you no show three or more times for appointments you may be dismissed from the clinic at the providers discretion.     Again, thank you for choosing [facility].  Our hope is that these requests will decrease the amount of time that you wait before being seen by our physicians.       _____________________________________________________________  Should you have questions after your visit to [facility], please contact our office at [phone] between the hours of 8:30 a.m. and 4:30 p.m.  Voicemails left after 4:30 p.m. will not be returned until the following business day.  For prescription refill requests, have your pharmacy contact our office.         Resources For Cancer Patients and their Caregivers ? American Cancer Society: Can assist with transportation, wigs, general needs, runs Look Good Feel Better.        (204)325-7562 ? Cancer  Care: Provides financial assistance, online support groups, medication/co-pay assistance.  1-800-813-HOPE (870) 765-8844) ? [place] Assists [place] cancer patients and their families through emotional , educational and financial support.  8655009597 ? [place] DSS Where to apply for food stamps, Medicaid and utility assistance. 6615882486 ? RCATS: Transportation to medical appointments. 9842940673 ? Social Security Administration: May apply for disability if have a Stage IV cancer. (219)227-3623 910 282 3028 ? [employer], Disability and Transit Services: Assists with nutrition, care and transit needs. [place] Support Programs: @10RELATIVEDAYS @ > Cancer Support Group  2nd Tuesday of the month 1pm-2pm, Journey Room  > Creative Journey  3rd Tuesday of the month 1130am-1pm, Journey Room  > Look Good Feel Better  1st Wednesday of the month 10am-12 noon, Journey Room (Call [place] to register [phone])

## 2016-05-14 NOTE — Progress Notes (Signed)
Andrea Bogus, MD [address] [place] [address]  Chronic myeloid leukemia, BCR/ABL-positive, not having achieved remission ([place]) - Plan: BCR-ABL1, CML/ALL, PCR, QUANT  CURRENT THERAPY: Tasigna 150 mg PO BID beginning on 01/02/2016.  INTERVAL HISTORY: Andrea Martinez 70 y.o. female returns for followup of CML, [place] +.    Chronic myelogenous leukemia (CML), BCR-ABL1-positive ([place])   11/21/2015 Initial Diagnosis Chronic myelogenous leukemia (CML), BCR-ABL1-positive ([place])   11/21/2015 Miscellaneous Seen by Dr. Florene [facility]   11/21/2015 -  Chemotherapy Hydrea 1000 mg and Allopurinol 300 mg daily (due to high uric acid level)   11/21/2015 Tumor Marker BCR P210 Ratio of Fusion to Control, Blood 0.8941    P210 IS % Ratio, Blood 85.834%     01/02/2016 -  Chemotherapy Tasigna   01/08/2016 Miscellaneous EKG- QT/QTc: 382/440 ms   01/16/2016 Miscellaneous EKG- QT/QTc: 364/419   03/30/2016 Tumor Marker b2a2 transcript: 0.317 b3a2 transcript: 0.194   I personally reviewed and went over laboratory results with the patient.  The results are noted within this dictation.  Labs will be updated today.  She notes that she feels well.  She has a few complaints: 1. She notes an odor of her skin that she cannot describe that is new to her.  She admits that no one else can smell it. 2. Lower lip sore-  She has a lower lip, midline, aphthous ulcer that is ~2 mm in size.   3. Continued day and night sweats.  She notes that she must clean the back of her neck of sweat throughout the day.  At night, she must change her bed clothes from time to time.  She notes that at night, her thorax is wet from sweat.  She denies any fevers or chills.  Chart reviewed.  She has been seen by cardiology and follow-up in a few months.  Review of Systems  Constitutional: Negative for fever, chills and weight loss.       Sweating throughout day (back of neck) and night (chest)  HENT: Negative for  nosebleeds, sore throat and tinnitus.        Notes an odor of her skin that unfamiliar to her.  Eyes: Negative.   Respiratory: Negative.   Cardiovascular: Negative.   Gastrointestinal: Negative.   Genitourinary: Negative.   Musculoskeletal: Negative.   Skin: Negative.   Neurological: Negative.  Negative for headaches.  Endo/Heme/Allergies: Negative.   Psychiatric/Behavioral: Negative.     Past Medical History  Diagnosis Date  . Gastric ulcer 2011    EGD, 5/11  . Hypothyroidism     not on meds, followed by Dr. Elyse Hsu  . PONV (postoperative nausea and vomiting)   . Lipoma     left upper arm  . UTI (lower urinary tract infection)     frequent  . Leukocytosis 11/20/2015  . 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 Esophagogastroduodenoscopy  02/2010    gastric ulcers  . Partial hysterectomy      vaginal at age 1 years of age  . Nose surgery    . Lipoma excision  08/02/2011    left shoulder  . Colonoscopy  01/2004    [facility], few small tics  . Esophagogastroduodenoscopy  12/04/2012    BTD:HRCBULA gastritis (inflammation) was found in the gastric antrum; multiple biopsies The duodenal mucosa showed no abnormalities in the bulb and second portion of the  duodenum  . Bravo ph study  12/04/2012    Procedure: BRAVO [place];  Surgeon: [name], MD;  [facility];  Service: Endoscopy;  Laterality: N/A;  . Toe debridement  1962    lt great toe  . Cholecystectomy N/A 01/03/2014    Procedure: LAPAROSCOPIC CHOLECYSTECTOMY WITH INTRAOPERATIVE CHOLANGIOGRAM;  Surgeon: Joyice Faster. Cornett, MD;  Location: [place];  Service: General;  Laterality: N/A;    Family History  Problem Relation Age of Onset  . Lung cancer Father   . COPD Mother   . Colon cancer Neg Hx   . Anesthesia problems Neg Hx   . Hypotension Neg Hx   . Malignant hyperthermia Neg Hx   . Pseudochol  deficiency Neg Hx     Social History   Social History  . Marital Status: Married    Spouse Name: N/A  . Number of Children: 2  . Years of Education: 12th grade   Occupational History  . retired   .     Social History Main Topics  . Smoking status: Never Smoker   . Smokeless tobacco: Never Used  . Alcohol Use: No  . Drug Use: No  . Sexual Activity:    Partners: Male    Birth Control/ Protection: None   Other Topics Concern  . Not on file   Social History Narrative     PHYSICAL EXAMINATION  ECOG PERFORMANCE STATUS: 1 - Symptomatic but completely ambulatory  Filed Vitals:   05/14/16 0908  BP: 125/54  Pulse: 82  Temp: 98.2 F (36.8 C)  Resp: 20    GENERAL:alert, no distress, well nourished, well developed, comfortable, cooperative, obese, smiling and unaccompanied SKIN: skin color, texture, turgor are normal, no rashes or significant lesions HEAD: Normocephalic, No masses, lesions, tenderness or abnormalities EYES: normal, EOMI, Conjunctiva are pink and non-injected EARS: External ears normal OROPHARYNX:lips, buccal mucosa, and tongue normal and ulcer on interior of lower lip measuring 1-2 mm in size with white center surrounded by erythema. NECK: supple, trachea midline LYMPH:  no palpable lymphadenopathy BREAST:not examined LUNGS: clear to auscultation  HEART: regular rate & rhythm, no murmurs, no gallops, S1 normal and S2 normal ABDOMEN:abdomen soft, obese and normal bowel sounds BACK: Back symmetric, no curvature. EXTREMITIES:less then 2 second capillary refill, no joint deformities, effusion, or inflammation, no skin discoloration, no cyanosis  NEURO: alert & oriented x 3 with fluent speech, no focal motor/sensory deficits, gait normal   LABORATORY DATA: CBC    Component Value Date/Time   WBC 7.4 05/14/2016 0958   RBC 3.87 05/14/2016 0958   RBC 2.95* 02/06/2016 1238   HGB 10.1* 05/14/2016 0958   HCT 32.1* 05/14/2016 0958   PLT 272 05/14/2016 0958     MCV 82.9 05/14/2016 0958   MCH 26.1 05/14/2016 0958   MCHC 31.5 05/14/2016 0958   RDW 14.7 05/14/2016 0958   LYMPHSABS 1.2 05/14/2016 0958   MONOABS 0.6 05/14/2016 0958   EOSABS 0.2 05/14/2016 0958   BASOSABS 0.0 05/14/2016 0958      Chemistry      Component Value Date/Time   NA 140 05/14/2016 0958   K 4.4 05/14/2016 0958   CL 105 05/14/2016 0958   CO2 29 05/14/2016 0958   BUN 19 05/14/2016 0958   CREATININE 0.66 05/14/2016 0958   CREATININE 0.87 07/14/2011 0938      Component Value Date/Time   CALCIUM 9.3 05/14/2016 0958   ALKPHOS 177* 05/14/2016 0958   AST 33  05/14/2016 0958   ALT 30 05/14/2016 0958   BILITOT 0.6 05/14/2016 0958        PENDING LABS:   RADIOGRAPHIC STUDIES:  No results found.   PATHOLOGY:    ASSESSMENT AND PLAN:  Chronic myelogenous leukemia (CML), BCR-ABL1-positive (H[facility]) CML, BCR/ABL positive ([place] +).  Currently on Tasigna 150 mg PO BID beginning on 01/02/2016 with an excellent response thus far.  Oncology history is updated.  I personally reviewed and went over laboratory results with the patient.  The results are noted within this dictation.   Labs every 2 weeks: CBC diff, CMET, LDH, Mg, Phosphorus  She has an appt on 05/25/2016 for [facility] evaluation (Cancer Rehab).  She is encouraged to continue with group therapy.  She is to continue with Lexapro as prescribed.  Compliance with Tasigna is again reviewed.  Return in 4 weeks for follow-up.     ORDERS PLACED FOR THIS ENCOUNTER: Orders Placed This Encounter  Procedures  . BCR-ABL1, CML/ALL, PCR, QUANT    MEDICATIONS PRESCRIBED THIS ENCOUNTER: No orders of the defined types were placed in this encounter.    THERAPY PLAN:  NCCN guidelines pertaining to CML surveillance include:  A. Bone marrow cytogenetics:   1. At diagnosis to establish the disease phase.  If collection of bone marrow is not feasible, FISH on a peripheral blood specimen using dual probes for the  BCR and ABL genes is acceptable method of confirming the diagnosis of CML.   2. At 3 and 6 months from initiation of therapy if QPCR using IS is not available to assess response to TKI therapy.     3. At 12 months from initiation of therapy, if CCyR or MMR is not achieved.  Absence of MMR in the presence of CCyR is not considered a treatment failure.   4. At 18 months from initiation of therapy, if not MMR or lack of CCyR at 12 months. Absence of MMR in the presence of a CCyR is not considered a treatment failure.  Bone marrow cytogenetics are not necessary if patient in MMR at 12 months.   5. 1-log increase in BCR-ABL1 transcript level without MMR.  B. Quantitative RT-PCR (QPCR) using IS:   1. At diagnosis   2. Every 3 months after initiating treatment.  After CCyR has been achieved, every 3 months for 3 years and every 3-6 months thereafter.   3. If there is 1-log increase in BCR-ABL1 transcript levels with MMR, QPCR analysis should be repeated in 1-3 months.  C. BCR-ABL kinase domain mutation analysis   1. Chronic phase    A. Inadequate initial response to TKI therapy (lack of PCyR or BCR-ABL1/ABL1 > 10% (IS) at 3 and 6 months or less than a CCyR at 12 and 18 months.    B. Any sign of loss of response (defined as hematologic or cytogenetic relapse).    C. 1-log increase in BCR-ABL1 transcript levels and loss of MMR   2. Disease progression to accelerated or blast phase.   All questions were answered. The patient knows to call the clinic with any problems, questions or concerns. We can certainly see the patient much sooner if necessary.  Patient and plan discussed with Dr. Ancil Linsey and she is in agreement with the aforementioned.   This note is electronically signed by: Doy Mince 05/14/2016 6:13 PM

## 2016-05-25 ENCOUNTER — Ambulatory Visit (HOSPITAL_COMMUNITY): Payer: Medicare Other | Attending: Hematology & Oncology | Admitting: Physical Therapy

## 2016-05-25 VITALS — HR 86

## 2016-05-25 DIAGNOSIS — M6281 Muscle weakness (generalized): Secondary | ICD-10-CM

## 2016-05-25 DIAGNOSIS — M79604 Pain in right leg: Secondary | ICD-10-CM

## 2016-05-25 DIAGNOSIS — M79605 Pain in left leg: Secondary | ICD-10-CM | POA: Diagnosis not present

## 2016-05-25 NOTE — Patient Instructions (Addendum)
  Mini squats  Stand at countertop and bend knees for a mini squat.   2 x 10 reps      DIPS IN CHAIR  While sitting in a chair with arm rests, push yourself upawards so that you lift your buttocks of the chair. Then lower down controlled back to normal seated position.   If you are unable to lift yourself up, you can perform "pressure releases" so that you simply push to take some weight off your buttocks.  2x10 reps

## 2016-05-25 NOTE — Therapy (Addendum)
[place], [place], [address] Phone: [phone]   Fax:  [phone]  Physical Therapy Evaluation  Patient Details  Name: Andrea Martinez MRN: [id number] Date of Birth: Mar 17, 1946 Referring Provider: Dr. Ancil Linsey  Encounter Date: 05/25/2016      PT End of Session - 05/25/16 1552    Visit Number 1   Number of Visits 12   Date for PT Re-Evaluation 06/08/2016   Authorization Type UHC Medicare   Authorization Time Period 05/25/2016 - 06/22/2016   Authorization - Visit Number 1   Authorization - Number of Visits 12   PT Start Time [id number]   PT Stop Time 1520   PT Time Calculation (min) 47 min   Equipment Utilized During Treatment Other (comment)  pulse oximeter   Activity Tolerance Patient tolerated treatment well   Behavior During Therapy [facility] for tasks assessed/performed      Past Medical History  Diagnosis Date  . Gastric ulcer 2011    EGD, 5/11  . Hypothyroidism     not on meds, followed by Dr. Elyse Hsu  . PONV (postoperative nausea and vomiting)   . Lipoma     left upper arm  . UTI (lower urinary tract infection)     frequent  . Leukocytosis 11/20/2015  . Chronic myelogenous leukemia (CML), BCR-ABL1-positive ([place]) 11/20/2015    Past Surgical History  Procedure Laterality Date  . Esophagogastroduodenoscopy  02/2010    gastric ulcers  . Partial hysterectomy      vaginal at age 47 years of age  . Nose surgery    . Lipoma excision  08/02/2011    left shoulder  . Colonoscopy  01/2004    DR [facility], few small tics  . Esophagogastroduodenoscopy  12/04/2012    [id number] gastritis (inflammation) was found in the gastric antrum; multiple biopsies The duodenal mucosa showed no abnormalities in the bulb and second portion of the duodenum  . Bravo ph study  12/04/2012    Procedure: BRAVO [place];  Surgeon: [name], MD;  [facility];  Service: Endoscopy;  Laterality: N/A;  . Toe  debridement  1962    lt great toe  . Cholecystectomy N/A 01/03/2014    Procedure: LAPAROSCOPIC CHOLECYSTECTOMY WITH INTRAOPERATIVE CHOLANGIOGRAM;  Surgeon: Joyice Faster. Cornett, MD;  Location: [facility];  Service: General;  Laterality: N/A;    Filed Vitals:   05/25/16 1435  Pulse: 86  SpO2: 98%         Subjective Assessment - 05/25/16 1435    Subjective Pt states that she is here because her doctor says that she is still weak, and has tendancy to shuffle feet when she walks.  Pt states that her WBC is normal at this point.  Pt states that she has had 2 falls because of weakness.    Patient is accompained by: Family member  Husband   Pertinent History prolapsed  bladder - no treadmill due to incontinence, arthritis in knees - pt states she needs TKA's, but not able to get them yet because of the [facility]   How long can you stand comfortably? 30-45 minutes   How long can you walk comfortably? no limit - but not sure.    Patient Stated Goals Pt states she wants to be able to move around without having to stop and rest.   Currently in Pain? Yes   Pain Score 4    Pain Location Knee   Pain Orientation  Right;Left   Pain Descriptors / Indicators Aching;Burning   Pain Type Chronic pain   Pain Radiating Towards no   Pain Onset More than a month ago   Aggravating Factors  Over-doing it   Pain Relieving Factors Pain medication up to every 6 hours   Effect of Pain on Daily Activities Pt states that it has put a hold on her house work, and she has to divide it up throughout the week.              [facility] PT Assessment - 05/25/16 0001    Assessment   Medical Diagnosis CML   Referring Provider Dr. Ancil Linsey   Next MD Visit 06/11/2016   Precautions   Precautions Other (comment)  CA - on chemotherapy drugs   Restrictions   Weight Bearing Restrictions No   Balance Screen   Has the patient fallen in the past 6 months Yes   How many times? 2  Due to weakness from [facility]   Has  the patient had a decrease in activity level because of a fear of falling?  Yes   Is the patient reluctant to leave their home because of a fear of falling?  No   Prior Function   Level of Independence Independent   Cognition   Overall Cognitive Status Within Functional Limits for tasks assessed   Observation/Other Assessments   Observations Edema in B LE's - pt states that is normal for her, and it gets worse the more she is on her feet.    Strength   Right Hip Flexion 3/5   Right Hip Extension 3-/5   Right Hip External Rotation  3/5   Right Hip Internal Rotation 3/5   Right Hip ABduction 3/5   Left Hip Flexion 3/5   Left Hip Extension 3-/5   Left Hip External Rotation 3/5   Left Hip Internal Rotation 3/5   Left Hip ABduction 3/5   Right Knee Flexion 4-/5   Right Knee Extension 3+/5   Left Knee Flexion 4/5   Left Knee Extension 4/5   Right Ankle Dorsiflexion 5/5   Left Ankle Dorsiflexion 5/5   Transfers   Transfers Sit to Stand   Sit to Stand 7: Independent;Without upper extremity assist   Five time sit to stand comments  chair without arm rests, and no UE support.  12.32seconds.    Ambulation/Gait   Ambulation/Gait Yes   Ambulation Distance (Feet) 678 Feet   Assistive device None   Gait Pattern Step-through pattern;Antalgic   Ambulation Surface Level   Gait velocity 1.72 m/sec    6 Minute Walk- Baseline   HR (bpm) 86   6 Minute walk- Post Test   6 Minute Walk Post Test --  3MWT - 633ft                   [facility] Adult PT Treatment/Exercise - 05/25/16 0001    Knee/Hip Exercises: Standing   Other Standing Knee Exercises mini squat with B UE support 2 x 10 reps with vc's for posture.    Other Standing Knee Exercises chair push up's x10 reps                PT Education - 05/25/16 1551    Education provided Yes   Education Details Pt encouraged on ambulating around the house to improve endurance.  Pt also instructed on proper posture with exercises,  and HEP.   Person(s) Educated Patient;Spouse   Methods Explanation;Handout   Comprehension  Verbalized understanding;Returned demonstration          PT Short Term Goals - 05/25/16 1602    PT SHORT TERM GOAL #1   Title Pt will report consistency with HEP for strengthening to improve overal strength for functional mobility.    Time 3   Period Weeks   Status New   PT SHORT TERM GOAL #2   Title Pt will demonstrate improvement in LE MMT by 1/2 grade to improve balance, and functional activities with decrease in pain.    Time 3   Period Weeks   Status New   PT SHORT TERM GOAL #3   Title Pt will report 5/5 fall precautions to ensure pt understands what situations can place her at risk for falling.    Time 3   Period Weeks   Status New   PT SHORT TERM GOAL #4   Title Pt will demonstrate ability to perform SLS for 10 seconds on each foot to improve balance, and reduce risk for falling.    Time 3   Period Weeks   Status New           PT Long Term Goals - 05/25/16 1606    PT LONG TERM GOAL #1   Title Pt will report <2/5 pain in B knees for 70% of the time to  improve pt's QOL, and increase activity tolerance.    Time 6   Period Weeks   Status New   PT LONG TERM GOAL #2   Title Pt will perform 5 time sit<>stand in less than 10 seconds with no UE support to demonstrate improvement with functional strength and endurance .    Time 6   Period Weeks   Status New   PT LONG TERM GOAL #3   Title Pt will demonstrate an increase in at least 1 MMT grade to improve her balance, endurance and decreased pain for functional activities.    Time 6   Period Weeks   Status New   PT LONG TERM GOAL #4   Title Pt will demonstrate ability to perform SLS for 15 sec on each LE to demonstrate improved balance and decreased risk of falls.    Time 6   Period Weeks   Status New               Plan - 05/25/16 1553    Clinical Impression Statement Pt presents today with generalized weakness  from chronic myeloplatic leukemia.  Pt demonstrates decrease in LE strength, and also reports that she has had 2 falls recently due to weakness.  Pt would benefit from continued skilled PT to optimize her balance, strength and mobility for ease of daily activities and return to leisure activites.    Rehab Potential Good   Clinical Impairments Affecting Rehab Potential Arthritis in knees   PT Frequency 2x / week   PT Duration 6 weeks   PT Treatment/Interventions ADLs/Self Care Home Management;Biofeedback;Moist Heat;Gait training;DME Instruction;Stair training;Functional mobility training;Therapeutic activities;Therapeutic exercise;Balance training;Patient/family education   PT Next Visit Plan Review HEP, BERG balance testing, Generalized LE strengthening   PT Home Exercise Plan  mini squats, and chair push ups    Consulted and Agree with Plan of Care Family member/caregiver;Patient   Family Member Consulted husband      Patient will benefit from skilled therapeutic intervention in order to improve the following deficits and impairments:  Abnormal gait, Decreased activity tolerance, Decreased balance, Decreased endurance, Decreased mobility, Decreased strength, Difficulty walking, Increased edema, Obesity, Pain  Visit Diagnosis: Muscle weakness (generalized) - Plan: PT plan of care cert/re-cert  Pain In Right Leg - Plan: PT plan of care cert/re-cert  Pain In Left Leg - Plan: PT plan of care cert/re-cert      G-Codes - [id number]    Functional Assessment Tool Used Clinical Assessment via strength testing and functional testing.    Functional Limitation Mobility: Walking and moving around   Mobility: Walking and Moving Around Current Status [phone]) At least 20 percent but less than 40 percent impaired, limited or restricted   Mobility: Walking and Moving Around Goal Status [phone]) At least 1 percent but less than 20 percent impaired, limited or restricted       Problem List Patient  Active Problem List   Diagnosis Date Noted  . Chronic myelogenous leukemia (CML), BCR-ABL1-positive ([place]) 11/20/2015  . Biliary dyskinesia 12/04/2013  . Dyspepsia 11/30/2012  . Rib pain 11/01/2011  . Colon cancer screening 09/21/2011  . Lipoma 07/27/2011  . Lipoma of arm 07/27/2011  . Mass of arm 07/08/2011  . PUD 05/20/2010  . ABDOMINAL PAIN RIGHT UPPER QUADRANT 02/12/2010  . UNSPECIFIED DISORDER OF THYROID 02/11/2010  . NAUSEA 02/11/2010  . Emiliano Dyer OF 02/11/2010    Beth Takota Cahalan, PT, DPT X: [phone]   [facility] [address] [place], [place], [address] Phone: [phone]   Fax:  [phone]  Name: Andrea Martinez MRN: [id number] Date of Birth: 1946-05-19

## 2016-05-27 ENCOUNTER — Ambulatory Visit (HOSPITAL_COMMUNITY): Payer: Medicare Other | Admitting: Physical Therapy

## 2016-05-27 DIAGNOSIS — M79605 Pain in left leg: Secondary | ICD-10-CM | POA: Diagnosis not present

## 2016-05-27 DIAGNOSIS — M6281 Muscle weakness (generalized): Secondary | ICD-10-CM

## 2016-05-27 DIAGNOSIS — M79604 Pain in right leg: Secondary | ICD-10-CM

## 2016-05-27 NOTE — Therapy (Signed)
[place], [place], [address] Phone: [phone]   Fax:  [phone]  Physical Therapy Treatment  Patient Details  Name: Andrea Martinez MRN: [id number] Date of Birth: 04/29/1946 Referring Provider: Dr. Ancil Linsey  Encounter Date: 05/27/2016      PT End of Session - 05/27/16 1342    Visit Number 2   Number of Visits 12   Date for PT Re-Evaluation 06/22/16   Authorization Type UHC Medicare   Authorization Time Period 05/25/2016 - 06/29/2016   Authorization - Visit Number 2   Authorization - Number of Visits 12   PT Start Time [id number]   PT Stop Time 1350   PT Time Calculation (min) 45 min   Equipment Utilized During Treatment Other (comment)  pulse oximeter   Activity Tolerance Patient tolerated treatment well   Behavior During Therapy [facility] for tasks assessed/performed      Past Medical History  Diagnosis Date  . Gastric ulcer 2011    EGD, 5/11  . Hypothyroidism     not on meds, followed by Dr. Elyse Hsu  . PONV (postoperative nausea and vomiting)   . Lipoma     left upper arm  . UTI (lower urinary tract infection)     frequent  . Leukocytosis 11/20/2015  . Chronic myelogenous leukemia (CML), BCR-ABL1-positive ([place]) 11/20/2015    Past Surgical History  Procedure Laterality Date  . Esophagogastroduodenoscopy  02/2010    gastric ulcers  . Partial hysterectomy      vaginal at age 25 years of age  . Nose surgery    . Lipoma excision  08/02/2011    left shoulder  . Colonoscopy  01/2004    DR [facility], few small tics  . Esophagogastroduodenoscopy  12/04/2012    [id number] gastritis (inflammation) was found in the gastric antrum; multiple biopsies The duodenal mucosa showed no abnormalities in the bulb and second portion of the duodenum  . Bravo ph study  12/04/2012    Procedure: BRAVO [place];  Surgeon: [name], MD;  [facility];  Service: Endoscopy;  Laterality: N/A;  . Toe  debridement  1962    lt great toe  . Cholecystectomy N/A 01/03/2014    Procedure: LAPAROSCOPIC CHOLECYSTECTOMY WITH INTRAOPERATIVE CHOLANGIOGRAM;  Surgeon: Joyice Faster. Cornett, MD;  Location: [facility];  Service: General;  Laterality: N/A;    There were no vitals filed for this visit.      Subjective Assessment - 05/27/16 1429    Subjective Pt reports compliance with HEP and no pain currently.    Currently in Pain? No/denies            [facility] PT Assessment - 05/27/16 0001    Berg Balance Test   Sit to Stand Able to stand without using hands and stabilize independently   Standing Unsupported Able to stand safely 2 minutes   Sitting with Back Unsupported but Feet Supported on Floor or Stool Able to sit safely and securely 2 minutes   Stand to Sit Sits safely with minimal use of hands   Transfers Able to transfer safely, minor use of hands   Standing Unsupported with Eyes Closed Able to stand 10 seconds safely   Standing Ubsupported with Feet Together Able to place feet together independently and stand 1 minute safely   From Standing, Reach Forward with Outstretched Arm Can reach confidently >25 cm (10")   From Standing Position, Pick up Object from Floor Able to  pick up shoe safely and easily   From Standing Position, Turn to Look Behind Over each Shoulder Looks behind from both sides and weight shifts well   Turn 360 Degrees Able to turn 360 degrees safely in 4 seconds or less   Standing Unsupported, Alternately Place Feet on Step/Stool Able to stand independently and safely and complete 8 steps in 20 seconds   Standing Unsupported, One Foot in [place] to place foot tandem independently and hold 30 seconds   Standing on One Leg Able to lift leg independently and hold 5-10 seconds   Total Score 55                     [facility] Adult PT Treatment/Exercise - 05/27/16 0001    Knee/Hip Exercises: Standing   Functional Squat 10 reps   Functional Squat  Limitations reviewed for HEP   SLS bilaterally 5" max   Other Standing Knee Exercises chair push up's x10 reps   Knee/Hip Exercises: Supine   Bridges 10 reps   Straight Leg Raises 10 reps   Knee/Hip Exercises: Sidelying   Hip ABduction 10 reps   Knee/Hip Exercises: Prone   Hip Extension Both;10 reps                PT Education - 05/27/16 1423    Education provided Yes   Education Details given copy of initial evaluation, reviewed HEP and goals and updated HEP   Person(s) Educated Patient   Methods Explanation;Demonstration;Tactile cues;Handout;Verbal cues   Comprehension Verbalized understanding;Returned demonstration;Verbal cues required;Tactile cues required          PT Short Term Goals - 05/25/16 1602    PT SHORT TERM GOAL #1   Title Pt will report consistency with HEP for strengthening to improve overal strength for functional mobility.    Time 3   Period Weeks   Status New   PT SHORT TERM GOAL #2   Title Pt will demonstrate improvement in LE MMT by 1/2 grade to improve balance, and functional activities with decrease in pain.    Time 3   Period Weeks   Status New   PT SHORT TERM GOAL #3   Title Pt will report 5/5 fall precautions to ensure pt understands what situations can place her at risk for falling.    Time 3   Period Weeks   Status New   PT SHORT TERM GOAL #4   Title Pt will demonstrate ability to perform SLS for 10 seconds on each foot to improve balance, and reduce risk for falling.    Time 3   Period Weeks   Status New           PT Long Term Goals - 05/25/16 1606    PT LONG TERM GOAL #1   Title Pt will report <2/5 pain in B knees for 70% of the time to  improve pt's QOL, and increase activity tolerance.    Time 6   Period Weeks   Status New   PT LONG TERM GOAL #2   Title Pt will perform 5 time sit<>stand in less than 10 seconds with no UE support to demonstrate improvement with functional strength and endurance .    Time 6   Period  Weeks   Status New   PT LONG TERM GOAL #3   Title Pt will demonstrate an increase in at least 1 MMT grade to improve her balance, endurance and decreased pain for functional activities.  Time 6   Period Weeks   Status New   PT LONG TERM GOAL #4   Title Pt will demonstrate ability to perform SLS for 15 sec on each LE to demonstrate improved balance and decreased risk of falls.    Time 6   Period Weeks   Status New               Plan - 05/27/16 1423    Clinical Impression Statement completed BERG balance test per PT POC.  Pt scored 55/56, with only limitation of SLS greater than 5 seconds without UE assist.  Pt overall with good static stability needing focus on dynamic balance.  Reviewed HEP with good form noted and progressed with LE strengthening exericses in supine, side lying and prone for updated HEP.  Encouraged patient to continue HEP and add new exercises as well as SLS with UE support.     Rehab Potential Good   Clinical Impairments Affecting Rehab Potential Arthritis in knees   PT Frequency 2x / week   PT Duration 6 weeks   PT Treatment/Interventions ADLs/Self Care Home Management;Biofeedback;Moist Heat;Gait training;DME Instruction;Stair training;Functional mobility training;Therapeutic activities;Therapeutic exercise;Balance training;Patient/family education   PT Next Visit Plan Review new exercises added to HEP and progress to standing exercises and dynamic balance.    PT Home Exercise Plan updated to include supine bridge, SLR, sidelying hip abduction and prone hip extension.   Consulted and Agree with Plan of Care Family member/caregiver;Patient   Family Member Consulted husband      Patient will benefit from skilled therapeutic intervention in order to improve the following deficits and impairments:  Abnormal gait, Decreased activity tolerance, Decreased balance, Decreased endurance, Decreased mobility, Decreased strength, Difficulty walking, Increased edema,  Obesity, Pain  Visit Diagnosis: Muscle weakness (generalized)  Pain In Right Leg  Pain In Left Leg     Problem List Patient Active Problem List   Diagnosis Date Noted  . Chronic myelogenous leukemia (CML), BCR-ABL1-positive ([place]) 11/20/2015  . Biliary dyskinesia 12/04/2013  . Dyspepsia 11/30/2012  . Rib pain 11/01/2011  . Colon cancer screening 09/21/2011  . Lipoma 07/27/2011  . Lipoma of arm 07/27/2011  . Mass of arm 07/08/2011  . PUD 05/20/2010  . ABDOMINAL PAIN RIGHT UPPER QUADRANT 02/12/2010  . UNSPECIFIED DISORDER OF THYROID 02/11/2010  . NAUSEA 02/11/2010  . Emiliano Dyer OF 02/11/2010    Teena Irani, PTA/CLT [phone]  05/27/2016, 2:29 PM  [facility] [address] [place], [place], [address] Phone: [phone]   Fax:  [phone]  Name: HOLLEN LAWTER MRN: [id number] Date of Birth: 06-09-1946

## 2016-05-27 NOTE — Patient Instructions (Signed)
HIP / KNEE: Extension - Prone    Squeeze glutes. Raise leg up. Keep knee straight. _10_ reps per set, _2__ sets per day   Abduction: Side Leg Lift (Eccentric) - Side-Lying    Lie on side. Lift top leg slightly higher than shoulder level. Keep top leg straight with body, toes pointing forward.  _10__ reps,  _2__ times per day.   Bridge [employer] small of back into mat, maintain pelvic tilt, roll up one vertebrae at a time. Focus on engaging posterior hip muscles. __10_ reps __2_ time/day.   Straight Leg Raise    Tighten stomach and slowly raise locked right leg to knee level of opposite leg.  Repeat _10__ times  __2__ sets per day.

## 2016-05-28 ENCOUNTER — Encounter (HOSPITAL_COMMUNITY): Payer: Medicare Other

## 2016-05-28 DIAGNOSIS — D72829 Elevated white blood cell count, unspecified: Secondary | ICD-10-CM | POA: Diagnosis not present

## 2016-05-28 DIAGNOSIS — D649 Anemia, unspecified: Secondary | ICD-10-CM

## 2016-05-28 DIAGNOSIS — C9210 Chronic myeloid leukemia, BCR/ABL-positive, not having achieved remission: Secondary | ICD-10-CM | POA: Diagnosis not present

## 2016-05-28 LAB — COMPREHENSIVE METABOLIC PANEL
ALK PHOS: 180 U/L — AB (ref 38–126)
ALT: 31 U/L (ref 14–54)
ANION GAP: 8 (ref 5–15)
AST: 39 U/L (ref 15–41)
Albumin: 4.1 g/dL (ref 3.5–5.0)
BUN: 20 mg/dL (ref 6–20)
CALCIUM: 9 mg/dL (ref 8.9–10.3)
CO2: 27 mmol/L (ref 22–32)
CREATININE: 0.73 mg/dL (ref 0.44–1.00)
Chloride: 103 mmol/L (ref 101–111)
Glucose, Bld: 102 mg/dL — ABNORMAL HIGH (ref 65–99)
Potassium: 4.1 mmol/L (ref 3.5–5.1)
SODIUM: 138 mmol/L (ref 135–145)
Total Bilirubin: 0.9 mg/dL (ref 0.3–1.2)
Total Protein: 7.4 g/dL (ref 6.5–8.1)

## 2016-05-28 LAB — LACTATE DEHYDROGENASE: LDH: 173 U/L (ref 98–192)

## 2016-05-28 LAB — CBC WITH DIFFERENTIAL/PLATELET
Basophils Absolute: 0 10*3/uL (ref 0.0–0.1)
Basophils Relative: 0 %
EOS ABS: 0.3 10*3/uL (ref 0.0–0.7)
EOS PCT: 3 %
HCT: 35.2 % — ABNORMAL LOW (ref 36.0–46.0)
HEMOGLOBIN: 11.2 g/dL — AB (ref 12.0–15.0)
LYMPHS ABS: 1.8 10*3/uL (ref 0.7–4.0)
LYMPHS PCT: 19 %
MCH: 26.4 pg (ref 26.0–34.0)
MCHC: 31.8 g/dL (ref 30.0–36.0)
MCV: 82.8 fL (ref 78.0–100.0)
MONOS PCT: 6 %
Monocytes Absolute: 0.6 10*3/uL (ref 0.1–1.0)
Neutro Abs: 6.6 10*3/uL (ref 1.7–7.7)
Neutrophils Relative %: 72 %
PLATELETS: 308 10*3/uL (ref 150–400)
RBC: 4.25 MIL/uL (ref 3.87–5.11)
RDW: 15.4 % (ref 11.5–15.5)
WBC: 9.2 10*3/uL (ref 4.0–10.5)

## 2016-05-28 LAB — PHOSPHORUS: PHOSPHORUS: 3.5 mg/dL (ref 2.5–4.6)

## 2016-05-28 LAB — MAGNESIUM: MAGNESIUM: 2.1 mg/dL (ref 1.7–2.4)

## 2016-06-01 ENCOUNTER — Ambulatory Visit (HOSPITAL_COMMUNITY): Payer: Medicare Other

## 2016-06-01 DIAGNOSIS — M79605 Pain in left leg: Secondary | ICD-10-CM

## 2016-06-01 DIAGNOSIS — M6281 Muscle weakness (generalized): Secondary | ICD-10-CM

## 2016-06-01 DIAGNOSIS — M79604 Pain in right leg: Secondary | ICD-10-CM

## 2016-06-01 NOTE — Therapy (Signed)
[place], [place], [address] Phone: [phone]   Fax:  [phone]  Physical Therapy Treatment  Patient Details  Name: Andrea Martinez MRN: [id number] Date of Birth: 05-Jan-1946 Referring Provider: Dr. Ancil Linsey  Encounter Date: 06/01/2016      PT End of Session - 06/01/16 1612    Visit Number 3   Number of Visits 12   Date for PT Re-Evaluation 06/22/16   Authorization Type UHC Medicare   Authorization Time Period 05/25/2016 - 06/29/2016   Authorization - Visit Number 3   Authorization - Number of Visits 12   PT Start Time [id number]   PT Stop Time 1649   PT Time Calculation (min) 44 min   Equipment Utilized During Treatment Gait belt   Activity Tolerance Patient tolerated treatment well   Behavior During Therapy [facility] for tasks assessed/performed      Past Medical History  Diagnosis Date  . Gastric ulcer 2011    EGD, 5/11  . Hypothyroidism     not on meds, followed by Dr. Elyse Hsu  . PONV (postoperative nausea and vomiting)   . Lipoma     left upper arm  . UTI (lower urinary tract infection)     frequent  . Leukocytosis 11/20/2015  . Chronic myelogenous leukemia (CML), BCR-ABL1-positive ([place]) 11/20/2015    Past Surgical History  Procedure Laterality Date  . Esophagogastroduodenoscopy  02/2010    gastric ulcers  . Partial hysterectomy      vaginal at age 55 years of age  . Nose surgery    . Lipoma excision  08/02/2011    left shoulder  . Colonoscopy  01/2004    DR [facility], few small tics  . Esophagogastroduodenoscopy  12/04/2012    [id number] gastritis (inflammation) was found in the gastric antrum; multiple biopsies The duodenal mucosa showed no abnormalities in the bulb and second portion of the duodenum  . Bravo ph study  12/04/2012    Procedure: BRAVO [place];  Surgeon: [name], MD;  [facility];  Service: Endoscopy;  Laterality: N/A;  . Toe debridement  1962    lt great  toe  . Cholecystectomy N/A 01/03/2014    Procedure: LAPAROSCOPIC CHOLECYSTECTOMY WITH INTRAOPERATIVE CHOLANGIOGRAM;  Surgeon: Joyice Faster. Cornett, MD;  Location: [place];  Service: General;  Laterality: N/A;    There were no vitals filed for this visit.      Subjective Assessment - 06/01/16 1603    Subjective Pt stated compliance with new HEP multiple times a day.  Pt c/o Rt knee constant pain scale 4/10   Pertinent History prolapsed  bladder - no treadmill due to incontinence, arthritis in knees - pt states she needs TKA's, but not able to get them yet because of the [facility]   Patient Stated Goals Pt states she wants to be able to move around without having to stop and rest.   Currently in Pain? Yes   Pain Score 4    Pain Location Knee   Pain Orientation Right;Left   Pain Descriptors / Indicators Constant;Aching   Pain Type Chronic pain   Pain Radiating Towards none   Pain Onset More than a month ago   Aggravating Factors  Over-doing it   Pain Relieving Factors Pain medication up to every 6 hours   Effect of Pain on Daily Activities Pt states that it has put a hold on her house work, and she has to divide it  up throughout the week                         [facility] Adult PT Treatment/Exercise - 06/01/16 0001    Knee/Hip Exercises: Standing   Heel Raises 20 reps   Heel Raises Limitations toe raises with cueing for form   Forward Lunges Both;10 reps   Forward Lunges Limitations 6in step   Forward Step Up Both;10 reps;Hand Hold: 1;Step Height: 4"   Forward Step Up Limitations increase height next session per knee pain tolerance   Functional Squat 15 reps   Functional Squat Limitations  min cueing for form   SLS Rt 23", LT 19" max of 3   Other Standing Knee Exercises 2x 10 STS eccentric control; tandem stance on foam 3x 30"   Other Standing Knee Exercises chair push up's x10 reps- HEP   Knee/Hip Exercises: Supine   Bridges 15 reps   Straight Leg Raises 15  reps   Knee/Hip Exercises: Sidelying   Hip ABduction 10 reps   Hip ABduction Limitations cueing for form   Knee/Hip Exercises: Prone   Hip Extension Both   Hip Extension Limitations cueing for form'             PT Short Term Goals - 05/25/16 1602    PT SHORT TERM GOAL #1   Title Pt will report consistency with HEP for strengthening to improve overal strength for functional mobility.    Time 3   Period Weeks   Status New   PT SHORT TERM GOAL #2   Title Pt will demonstrate improvement in LE MMT by 1/2 grade to improve balance, and functional activities with decrease in pain.    Time 3   Period Weeks   Status New   PT SHORT TERM GOAL #3   Title Pt will report 5/5 fall precautions to ensure pt understands what situations can place her at risk for falling.    Time 3   Period Weeks   Status New   PT SHORT TERM GOAL #4   Title Pt will demonstrate ability to perform SLS for 10 seconds on each foot to improve balance, and reduce risk for falling.    Time 3   Period Weeks   Status New           PT Long Term Goals - 05/25/16 1606    PT LONG TERM GOAL #1   Title Pt will report <2/5 pain in B knees for 70% of the time to  improve pt's QOL, and increase activity tolerance.    Time 6   Period Weeks   Status New   PT LONG TERM GOAL #2   Title Pt will perform 5 time sit<>stand in less than 10 seconds with no UE support to demonstrate improvement with functional strength and endurance .    Time 6   Period Weeks   Status New   PT LONG TERM GOAL #3   Title Pt will demonstrate an increase in at least 1 MMT grade to improve her balance, endurance and decreased pain for functional activities.    Time 6   Period Weeks   Status New   PT LONG TERM GOAL #4   Title Pt will demonstrate ability to perform SLS for 15 sec on each LE to demonstrate improved balance and decreased risk of falls.    Time 6   Period Weeks   Status New  Plan - 06/01/16 1724     Clinical Impression Statement Reviewed new HEP for proper form with min verbal and tactile cueing for sidelying exercise for correct muscle activation.  Progressed CKC for functional strengthening and balance training on dynamic surface.  Pt able to complete all exercises with caution of Rt knee pain, no reports of increased pain with activities.     Rehab Potential Good   Clinical Impairments Affecting Rehab Potential Arthritis in knees   PT Frequency 2x / week   PT Duration 6 weeks   PT Treatment/Interventions ADLs/Self Care Home Management;Biofeedback;Moist Heat;Gait training;DME Instruction;Stair training;Functional mobility training;Therapeutic activities;Therapeutic exercise;Balance training;Patient/family education   PT Next Visit Plan Progress to standing exercises and dynamic balance. Increase step up height next session if able to tolerate with knee pain.        Patient will benefit from skilled therapeutic intervention in order to improve the following deficits and impairments:  Abnormal gait, Decreased activity tolerance, Decreased balance, Decreased endurance, Decreased mobility, Decreased strength, Difficulty walking, Increased edema, Obesity, Pain  Visit Diagnosis: Muscle weakness (generalized)  Pain In Right Leg  Pain In Left Leg     Problem List Patient Active Problem List   Diagnosis Date Noted  . Chronic myelogenous leukemia (CML), BCR-ABL1-positive ([place]) 11/20/2015  . Biliary dyskinesia 12/04/2013  . Dyspepsia 11/30/2012  . Rib pain 11/01/2011  . Colon cancer screening 09/21/2011  . Lipoma 07/27/2011  . Lipoma of arm 07/27/2011  . Mass of arm 07/08/2011  . PUD 05/20/2010  . ABDOMINAL PAIN RIGHT UPPER QUADRANT 02/12/2010  . UNSPECIFIED DISORDER OF THYROID 02/11/2010  . NAUSEA 02/11/2010  . Emiliano Dyer OF 02/11/2010   Ihor Austin, LPTA; [place]  Aldona Lento 06/01/2016, 5:44 PM  [place], [place], [address] Phone: [phone]   Fax:  [phone]  Name: Andrea Martinez MRN: [id number] Date of Birth: [birth year]

## 2016-06-03 ENCOUNTER — Ambulatory Visit (HOSPITAL_COMMUNITY): Payer: Medicare Other | Admitting: Physical Therapy

## 2016-06-03 DIAGNOSIS — M79605 Pain in left leg: Secondary | ICD-10-CM

## 2016-06-03 DIAGNOSIS — M79604 Pain in right leg: Secondary | ICD-10-CM

## 2016-06-03 DIAGNOSIS — M6281 Muscle weakness (generalized): Secondary | ICD-10-CM | POA: Diagnosis not present

## 2016-06-03 NOTE — Patient Instructions (Signed)
EXTENSION: Standing (Active)    Stand, both feet flat. Draw right leg behind body as far as possible.Complete __2_ sets of _10__ repetitions. Perform __2_ sessions per day.   Step-Up: Forward    Step up forward. Keep pelvis level and spine neutral. Do _10__ times, each leg, _2__ times per day.   Toe / Heel Raise    Complete heel raises 20 reps then toe raises 20 reps.  Hip Abduction (Standing)    Stand with support. Squeeze pelvic floor and hold. Lift right leg out to side, keeping toe forward. Repeat _10__ times. Do __2_ times a day.

## 2016-06-03 NOTE — Therapy (Signed)
[place], [place], [address] Phone: [phone]   Fax:  [phone]  Physical Therapy Treatment  Patient Details  Name: Andrea Martinez MRN: [id number] Date of Birth: 1946-08-30 Referring Provider: Dr. Ancil Linsey  Encounter Date: 06/03/2016      PT End of Session - 06/03/16 1607    Visit Number 4   Number of Visits 12   Date for PT Re-Evaluation 06/22/16   Authorization Type UHC Medicare   Authorization Time Period 05/25/2016 - 06/29/2016   Authorization - Visit Number 4   Authorization - Number of Visits 12   PT Start Time 1540   PT Stop Time 1625   PT Time Calculation (min) 45 min   Equipment Utilized During Treatment Gait belt   Activity Tolerance Patient tolerated treatment well   Behavior During Therapy [facility] for tasks assessed/performed      Past Medical History  Diagnosis Date  . Gastric ulcer 2011    EGD, 5/11  . Hypothyroidism     not on meds, followed by Dr. Elyse Hsu  . PONV (postoperative nausea and vomiting)   . Lipoma     left upper arm  . UTI (lower urinary tract infection)     frequent  . Leukocytosis 11/20/2015  . Chronic myelogenous leukemia (CML), BCR-ABL1-positive ([place]) 11/20/2015    Past Surgical History  Procedure Laterality Date  . Esophagogastroduodenoscopy  02/2010    gastric ulcers  . Partial hysterectomy      vaginal at age 70 years of age  . Nose surgery    . Lipoma excision  08/02/2011    left shoulder  . Colonoscopy  01/2004    DR [facility], few small tics  . Esophagogastroduodenoscopy  12/04/2012    [id number] gastritis (inflammation) was found in the gastric antrum; multiple biopsies The duodenal mucosa showed no abnormalities in the bulb and second portion of the duodenum  . Bravo ph study  12/04/2012    Procedure: BRAVO [place];  Surgeon: [name], MD;  [facility];  Service: Endoscopy;  Laterality: N/A;  . Toe debridement  1962    lt great  toe  . Cholecystectomy N/A 01/03/2014    Procedure: LAPAROSCOPIC CHOLECYSTECTOMY WITH INTRAOPERATIVE CHOLANGIOGRAM;  Surgeon: Joyice Faster. Cornett, MD;  Location: [place];  Service: General;  Laterality: N/A;    There were no vitals filed for this visit.                       [place] Adult PT Treatment/Exercise - 06/03/16 0001    Knee/Hip Exercises: Standing   Heel Raises 20 reps   Heel Raises Limitations toe raises with cueing for form   Forward Lunges Both;15 reps   Forward Lunges Limitations 4in step   Hip Abduction Both;10 reps   Hip Extension Both;10 reps   Forward Step Up Both;10 reps;Hand Hold: 1;Step Height: 4"   Forward Step Up Limitations 6" with 1 HHA   Step Down Both;10 reps;Step Height: 4";Hand Hold: 1   Step Down Limitations 4"   Functional Squat 15 reps   Functional Squat Limitations  min cueing for form   SLS Rt 16", LT 15" max of 3   Other Standing Knee Exercises 20 STS eccentric control; tandem stance on foam 3x 30"                PT Education - 06/03/16 1706    Education provided Yes  Education Details updated HEP   Person(s) Educated Patient   Methods Explanation;Demonstration;Tactile cues;Verbal cues;Handout   Comprehension Verbalized understanding;Returned demonstration;Verbal cues required;Tactile cues required          PT Short Term Goals - 05/25/16 1602    PT SHORT TERM GOAL #1   Title Pt will report consistency with HEP for strengthening to improve overal strength for functional mobility.    Time 3   Period Weeks   Status New   PT SHORT TERM GOAL #2   Title Pt will demonstrate improvement in LE MMT by 1/2 grade to improve balance, and functional activities with decrease in pain.    Time 3   Period Weeks   Status New   PT SHORT TERM GOAL #3   Title Pt will report 5/5 fall precautions to ensure pt understands what situations can place her at risk for falling.    Time 3   Period Weeks   Status New   PT  SHORT TERM GOAL #4   Title Pt will demonstrate ability to perform SLS for 10 seconds on each foot to improve balance, and reduce risk for falling.    Time 3   Period Weeks   Status New           PT Long Term Goals - 05/25/16 1606    PT LONG TERM GOAL #1   Title Pt will report <2/5 pain in B knees for 70% of the time to  improve pt's QOL, and increase activity tolerance.    Time 6   Period Weeks   Status New   PT LONG TERM GOAL #2   Title Pt will perform 5 time sit<>stand in less than 10 seconds with no UE support to demonstrate improvement with functional strength and endurance .    Time 6   Period Weeks   Status New   PT LONG TERM GOAL #3   Title Pt will demonstrate an increase in at least 1 MMT grade to improve her balance, endurance and decreased pain for functional activities.    Time 6   Period Weeks   Status New   PT LONG TERM GOAL #4   Title Pt will demonstrate ability to perform SLS for 15 sec on each LE to demonstrate improved balance and decreased risk of falls.    Time 6   Period Weeks   Status New               Plan - 06/03/16 1641    Clinical Impression Statement Continued functional strength and balance progression.  Pt with notable improvment, just with arthritic discomfort in Rt knee.  Increased forward step ups to 6" and added forward and lateral step ups using 4".  Also added hip abduction and extension to POC increase hip stability.  Updated HEP to include added exercises.   Rehab Potential Good   Clinical Impairments Affecting Rehab Potential Arthritis in knees   PT Frequency 2x / week   PT Duration 6 weeks   PT Treatment/Interventions ADLs/Self Care Home Management;Biofeedback;Moist Heat;Gait training;DME Instruction;Stair training;Functional mobility training;Therapeutic activities;Therapeutic exercise;Balance training;Patient/family education   PT Next Visit Plan Progress dynamic balance activities.     PT Home Exercise Plan updated with  standing therex      Patient will benefit from skilled therapeutic intervention in order to improve the following deficits and impairments:  Abnormal gait, Decreased activity tolerance, Decreased balance, Decreased endurance, Decreased mobility, Decreased strength, Difficulty walking, Increased edema, Obesity, Pain  Visit Diagnosis:  Muscle weakness (generalized)  Pain In Right Leg  Pain In Left Leg     Problem List Patient Active Problem List   Diagnosis Date Noted  . Chronic myelogenous leukemia (CML), BCR-ABL1-positive ([place]) 11/20/2015  . Biliary dyskinesia 12/04/2013  . Dyspepsia 11/30/2012  . Rib pain 11/01/2011  . Colon cancer screening 09/21/2011  . Lipoma 07/27/2011  . Lipoma of arm 07/27/2011  . Mass of arm 07/08/2011  . PUD 05/20/2010  . ABDOMINAL PAIN RIGHT UPPER QUADRANT 02/12/2010  . UNSPECIFIED DISORDER OF THYROID 02/11/2010  . NAUSEA 02/11/2010  . Emiliano Dyer OF 02/11/2010    Teena Irani, PTA/CLT [phone]  06/03/2016, 5:06 PM  [place] [address] [place], [place], [address] Phone: [phone]   Fax:  [phone]  Name: Andrea Martinez MRN: [id number] Date of Birth: [birth year]

## 2016-06-04 ENCOUNTER — Other Ambulatory Visit (HOSPITAL_COMMUNITY): Payer: Self-pay | Admitting: Oncology

## 2016-06-04 MED ORDER — TRAMADOL HCL 50 MG PO TABS: 50.0000 mg | ORAL_TABLET | Freq: Four times a day (QID) | ORAL | Status: DC | PRN

## 2016-06-07 MED ORDER — TRAMADOL HCL 50 MG PO TABS: 50.0000 mg | ORAL_TABLET | Freq: Four times a day (QID) | ORAL | Status: DC | PRN

## 2016-06-08 ENCOUNTER — Ambulatory Visit (HOSPITAL_COMMUNITY): Payer: Medicare Other

## 2016-06-08 DIAGNOSIS — M79605 Pain in left leg: Secondary | ICD-10-CM | POA: Diagnosis not present

## 2016-06-08 DIAGNOSIS — M79604 Pain in right leg: Secondary | ICD-10-CM | POA: Diagnosis not present

## 2016-06-08 DIAGNOSIS — M6281 Muscle weakness (generalized): Secondary | ICD-10-CM

## 2016-06-08 NOTE — Patient Instructions (Signed)
Forward Lunge    Standing with feet shoulder width apart and stomach tight, step forward with left leg onto step. Repeat 10-20 times per set. Repeat with opposite with leg Do 1-2 sets per session. Do ____ sessions per day.  http://orth.exer.us/1147   Copyright  VHI. All rights reserved.

## 2016-06-08 NOTE — Therapy (Signed)
[place], [place], [address] Phone: [phone]   Fax:  [phone]  Physical Therapy Treatment  Patient Details  Name: Andrea Martinez MRN: [id number] Date of Birth: Apr 28, 1946 Referring Provider: Dr. Ancil Linsey  Encounter Date: 06/08/2016      PT End of Session - 06/08/16 1610    Visit Number 5   Number of Visits 12   Date for PT Re-Evaluation 06/15/16   Authorization Type UHC Medicare   Authorization Time Period 05/25/2016 - 06/29/2016   Authorization - Visit Number 5   Authorization - Number of Visits 12   PT Start Time [id number]   PT Stop Time 1646   PT Time Calculation (min) 41 min   Equipment Utilized During Treatment Gait belt   Activity Tolerance Patient tolerated treatment well   Behavior During Therapy [facility] for tasks assessed/performed      Past Medical History  Diagnosis Date  . Gastric ulcer 2011    EGD, 5/11  . Hypothyroidism     not on meds, followed by Dr. Elyse Hsu  . PONV (postoperative nausea and vomiting)   . Lipoma     left upper arm  . UTI (lower urinary tract infection)     frequent  . Leukocytosis 11/20/2015  . Chronic myelogenous leukemia (CML), BCR-ABL1-positive ([place]) 11/20/2015    Past Surgical History  Procedure Laterality Date  . Esophagogastroduodenoscopy  02/2010    gastric ulcers  . Partial hysterectomy      vaginal at age 1 years of age  . Nose surgery    . Lipoma excision  08/02/2011    left shoulder  . Colonoscopy  01/2004    DR [facility], few small tics  . Esophagogastroduodenoscopy  12/04/2012    [id number] gastritis (inflammation) was found in the gastric antrum; multiple biopsies The duodenal mucosa showed no abnormalities in the bulb and second portion of the duodenum  . Bravo ph study  12/04/2012    Procedure: BRAVO [place];  Surgeon: [name], MD;  [facility];  Service: Endoscopy;  Laterality: N/A;  . Toe debridement  1962    lt great  toe  . Cholecystectomy N/A 01/03/2014    Procedure: LAPAROSCOPIC CHOLECYSTECTOMY WITH INTRAOPERATIVE CHOLANGIOGRAM;  Surgeon: Joyice Faster. Cornett, MD;  Location: [place];  Service: General;  Laterality: N/A;    There were no vitals filed for this visit.      Subjective Assessment - 06/08/16 1608    Subjective Pt stated she continues to have Rt knee pain scale 4/10, takes 1 pill per day to assist with pain. Reports compliance with HEP.   Pertinent History prolapsed  bladder - no treadmill due to incontinence, arthritis in knees - pt states she needs TKA's, but not able to get them yet because of the [facility]   Patient Stated Goals Pt states she wants to be able to move around without having to stop and rest.   Currently in Pain? Yes   Pain Score 4    Pain Location Knee   Pain Orientation Right   Pain Descriptors / Indicators Aching;Constant   Pain Radiating Towards none    Pain Onset More than a month ago   Aggravating Factors  Over-doing it   Pain Relieving Factors Pain medication once a day   Effect of Pain on Daily Activities Pt states that it has put a hold on her house work, and she has to divide it up throughout  the week             [facility] Adult PT Treatment/Exercise - 06/08/16 0001    Knee/Hip Exercises: Standing   Heel Raises 20 reps   Heel Raises Limitations toe raises with cueing for form   Forward Lunges Both;15 reps   Forward Lunges Limitations 4in step   Hip Abduction Both;15 reps   Abduction Limitations RTB resistance with 2" holds   Forward Step Up Both;15 reps;Hand Hold: 1;Step Height: 6"   Forward Step Up Limitations 6" with 1 HHA   Step Down Both;10 reps;Step Height: 4";Hand Hold: 1   Step Down Limitations 4"   Functional Squat 15 reps   Functional Squat Limitations  min cueing for form   SLS Rt 30", Lt 20"   Other Standing Knee Exercises tandem stance on airex 3x 30"   Other Standing Knee Exercises sidestep with RTB 2RT           PT  Short Term Goals - 05/25/16 1602    PT SHORT TERM GOAL #1   Title Pt will report consistency with HEP for strengthening to improve overal strength for functional mobility.    Time 3   Period Weeks   Status New   PT SHORT TERM GOAL #2   Title Pt will demonstrate improvement in LE MMT by 1/2 grade to improve balance, and functional activities with decrease in pain.    Time 3   Period Weeks   Status New   PT SHORT TERM GOAL #3   Title Pt will report 5/5 fall precautions to ensure pt understands what situations can place her at risk for falling.    Time 3   Period Weeks   Status New   PT SHORT TERM GOAL #4   Title Pt will demonstrate ability to perform SLS for 10 seconds on each foot to improve balance, and reduce risk for falling.    Time 3   Period Weeks   Status New           PT Long Term Goals - 05/25/16 1606    PT LONG TERM GOAL #1   Title Pt will report <2/5 pain in B knees for 70% of the time to  improve pt's QOL, and increase activity tolerance.    Time 6   Period Weeks   Status New   PT LONG TERM GOAL #2   Title Pt will perform 5 time sit<>stand in less than 10 seconds with no UE support to demonstrate improvement with functional strength and endurance .    Time 6   Period Weeks   Status New   PT LONG TERM GOAL #3   Title Pt will demonstrate an increase in at least 1 MMT grade to improve her balance, endurance and decreased pain for functional activities.    Time 6   Period Weeks   Status New   PT LONG TERM GOAL #4   Title Pt will demonstrate ability to perform SLS for 15 sec on each LE to demonstrate improved balance and decreased risk of falls.    Time 6   Period Weeks   Status New               Plan - 06/08/16 1646    Clinical Impression Statement Continued functional strength and balance training.  Added resistance to hip abduction and began sidestepping for glut med strengthening as well as continued balance training on dynamic balance.  Pt making  small gains with balance,  increased SLS time BLE.  Pt continues to c/o Rt knee arthritic discomfort, no reoprts of increased pain.  Pt extremely motivated with HEP exercises requesting more.  Pt able to demosntrate appropraite form and technique with forward lunges and no reports of increased pain, given additional exercise.  No reports of increased pain through session.     Rehab Potential Good   Clinical Impairments Affecting Rehab Potential Arthritis in knees   PT Frequency 2x / week   PT Duration 6 weeks   PT Treatment/Interventions ADLs/Self Care Home Management;Biofeedback;Moist Heat;Gait training;DME Instruction;Stair training;Functional mobility training;Therapeutic activities;Therapeutic exercise;Balance training;Patient/family education   PT Next Visit Plan Progress note for MD prior apt 06/30.  Progress dynamic balance activities.      Patient will benefit from skilled therapeutic intervention in order to improve the following deficits and impairments:  Abnormal gait, Decreased activity tolerance, Decreased balance, Decreased endurance, Decreased mobility, Decreased strength, Difficulty walking, Increased edema, Obesity, Pain  Visit Diagnosis: Muscle weakness (generalized)  Pain In Right Leg  Pain In Left Leg     Problem List Patient Active Problem List   Diagnosis Date Noted  . Chronic myelogenous leukemia (CML), BCR-ABL1-positive ([place]) 11/20/2015  . Biliary dyskinesia 12/04/2013  . Dyspepsia 11/30/2012  . Rib pain 11/01/2011  . Colon cancer screening 09/21/2011  . Lipoma 07/27/2011  . Lipoma of arm 07/27/2011  . Mass of arm 07/08/2011  . PUD 05/20/2010  . ABDOMINAL PAIN RIGHT UPPER QUADRANT 02/12/2010  . UNSPECIFIED DISORDER OF THYROID 02/11/2010  . NAUSEA 02/11/2010  . Emiliano Dyer OF 02/11/2010   Ihor Austin, LPTA; [place]  Aldona Lento 06/08/2016, 6:01 PM  [place], [place], [address] Phone: [phone]   Fax:  [phone]  Name: Andrea Martinez MRN: [id number] Date of Birth: Aug 17, 1946

## 2016-06-10 ENCOUNTER — Ambulatory Visit (HOSPITAL_COMMUNITY): Payer: Medicare Other

## 2016-06-10 DIAGNOSIS — M6281 Muscle weakness (generalized): Secondary | ICD-10-CM | POA: Diagnosis not present

## 2016-06-10 DIAGNOSIS — M79605 Pain in left leg: Secondary | ICD-10-CM

## 2016-06-10 DIAGNOSIS — M79604 Pain in right leg: Secondary | ICD-10-CM

## 2016-06-10 NOTE — Therapy (Addendum)
[place], [place], [address] Phone: [phone]   Fax:  [phone]  Physical Therapy Treatment  Patient Details  Name: Andrea Martinez MRN: [id number] Date of Birth: [birth year] Referring Provider: Dr. Ancil Linsey  Encounter Date: 06/10/2016      PT End of Session - 06/10/16 1610    Visit Number 6   Number of Visits 12   Date for PT Re-Evaluation 06/15/16  Progress note prior MD apt on 06/10/2016   Authorization Type [employer]   Authorization Time Period 05/25/2016 - 06/29/2016   Authorization - Visit Number 6   Authorization - Number of Visits 12   PT Start Time [id number]   PT Stop Time [id number]   PT Time Calculation (min) 43 min   Activity Tolerance Patient tolerated treatment well   Behavior During Therapy [facility] for tasks assessed/performed      Past Medical History  Diagnosis Date  . Gastric ulcer 2011    EGD, 5/11  . Hypothyroidism     not on meds, followed by Dr. Elyse Hsu  . PONV (postoperative nausea and vomiting)   . Lipoma     left upper arm  . UTI (lower urinary tract infection)     frequent  . Leukocytosis 11/20/2015  . Chronic myelogenous leukemia (CML), BCR-ABL1-positive ([place]) 11/20/2015    Past Surgical History  Procedure Laterality Date  . Esophagogastroduodenoscopy  02/2010    gastric ulcers  . Partial hysterectomy      vaginal at age 49 years of age  . Nose surgery    . Lipoma excision  08/02/2011    left shoulder  . Colonoscopy  01/2004    [facility], few small tics  . Esophagogastroduodenoscopy  12/04/2012    [id number] gastritis (inflammation) was found in the gastric antrum; multiple biopsies The duodenal mucosa showed no abnormalities in the bulb and second portion of the duodenum  . Bravo ph study  12/04/2012    Procedure: BRAVO [place];  Surgeon: [name], MD;  [facility];  Service: Endoscopy;  Laterality: N/A;  . Toe debridement  1962    lt great toe  .  Cholecystectomy N/A 01/03/2014    Procedure: LAPAROSCOPIC CHOLECYSTECTOMY WITH INTRAOPERATIVE CHOLANGIOGRAM;  Surgeon: Joyice Faster. Cornett, MD;  Location: [place];  Service: General;  Laterality: N/A;    There were no vitals filed for this visit.      Subjective Assessment - 06/10/16 1604    Subjective Pt stated MD apt tomorrow.  Pt continues to have Rt knee pain scale 4/10, has taken 1 pain per day to assist with pain.  Reports she has compliance with HEP.   Pertinent History prolapsed  bladder - no treadmill due to incontinence, arthritis in knees - pt states she needs TKA's, but not able to get them yet because of the CML   How long can you sit comfortably? unlimited   How long can you stand comfortably? several hours without problem, does complain in soreness in knees (was 30-45 minutes)   How long can you walk comfortably? Able to walk 30 minutes before shortness of breath   Patient Stated Goals Pt states she wants to be able to move around without having to stop and rest.   Currently in Pain? Yes   Pain Score 4    Pain Location Knee   Pain Orientation Right   Pain Descriptors / Indicators Aching;Constant   Pain Type Chronic pain  Pain Radiating Towards none   Pain Onset More than a month ago   Pain Frequency Constant   Aggravating Factors  Over-doing it   Pain Relieving Factors Pain medication once a day   Effect of Pain on Daily Activities Pt states that it has put a hold on her house work, and she has to divide it up throughout the week            [facility] PT Assessment - 06/10/16 0001    Assessment   Medical Diagnosis CML   Referring Provider Dr. Ancil Linsey   Next MD Visit 06/11/2016   Precautions   Precautions Other (comment)  CA- on chemotherapy drugs   Observation/Other Assessments   Observations Edema in B LE's - pt states that is normal for her, and it gets worse the more she is on her feet.    Strength   Right Hip Flexion 3+/5  was 3/5    Right Hip Extension 3+/5  was 3-/5   Right Hip External Rotation  --  was 3/5   Right Hip Internal Rotation --  was 3/5   Right Hip ABduction 3+/5  was 3/5   Left Hip Flexion 3+/5  was 3/5   Left Hip Extension 3+/5  was 3-/5   Left Hip External Rotation --  was 3/5   Left Hip Internal Rotation --  was 3/5   Left Hip ABduction 4-/5  was 3/5   Right Knee Flexion 4/5  was 4-/5   Right Knee Extension 4/5  was 3+/5   Left Knee Flexion 4/5  was 4/5   Left Knee Extension 4+/5  was 4/5   Right Ankle Dorsiflexion 5/5   Left Ankle Dorsiflexion 5/5                [facility] Adult PT Treatment/Exercise - 06/10/16 0001    Knee/Hip Exercises: Standing   Forward Lunges Both;15 reps   Forward Lunges Limitations 4in step   Lateral Step Up Both;15 reps;Hand Hold: 1;Step Height: 4"   Forward Step Up Both;15 reps;Hand Hold: 1;Step Height: 6"   Forward Step Up Limitations 6" with 1 HHA   Step Down Both;10 reps;Step Height: 4";Hand Hold: 1   Step Down Limitations 4"   Functional Squat 15 reps   SLS Rt 21", Lt 13"   Other Standing Knee Exercises sidestep with RTB 2RT                  PT Short Term Goals - 06/10/16 1610    PT SHORT TERM GOAL #1   Title Pt will report consistency with HEP for strengthening to improve overal strength for functional mobility.    Baseline 06/10/2016:  Reports compliance with HEP throughout every day   Status Achieved   PT SHORT TERM GOAL #2   Title Pt will demonstrate improvement in LE MMT by 1/2 grade to improve balance, and functional activities with decrease in pain.    Baseline 06/10/2016:  Improved 1/2 grade majority of MMT   Status Achieved   PT SHORT TERM GOAL #3   Title Pt will report 5/5 fall precautions to ensure pt understands what situations can place her at risk for falling.    Baseline 06/10/2106: Able to state fall precautions to reduce risk of fall   Status Achieved   PT SHORT TERM GOAL #4   Title Pt will demonstrate  ability to perform SLS for 10 seconds on each foot to improve balance, and reduce risk for falling.  Baseline 06/10/2016:    Status Achieved           PT Long Term Goals - 06/10/16 1616    PT LONG TERM GOAL #1   Title Pt will report <2/5 pain in B knees for 70% of the time to  improve pt's QOL, and increase activity tolerance.    Baseline 06/10/2016:  Bil Rt > Lt pain scale range 3-4/10 with 1 pain medication assistance   Time 6   Period Weeks   Status On-going   PT LONG TERM GOAL #2   Title Pt will perform 5 time sit<>stand in less than 10 seconds with no UE support to demonstrate improvement with functional strength and endurance .    Baseline 06/10/2016:  5 STS no UE A 7.74"   Status Achieved   PT LONG TERM GOAL #3   Title Pt will demonstrate an increase in at least 1 MMT grade to improve her balance, endurance and decreased pain for functional activities.    Baseline 06/10/2016: ongoing   Time 6   Period Weeks   Status On-going   PT LONG TERM GOAL #4   Title Pt will demonstrate ability to perform SLS for 15 sec on each LE to demonstrate improved balance and decreased risk of falls.    Baseline 06/08/2016: Able to complete Rt 30", Lt 20", 06/10/2016: Rt 21", Lt 13"   Status Achieved               Plan - 06/10/16 1654    Clinical Impression Statement Reviewed goals prior MD apt on 06/11/2016 with the following findings:  Pt compliant with HEP and able to demonstrate and/or verbalize appropriate form and technique wiht all exercises.  Strengthen is ongoing with increased by 1/2 grade with all MMT.  Pt improving balance with ability to SLS today Rt 21" and Lt 13" though has had higher SLS times in past treatments.  Pt able to verbalize fall risk precautions wiht no assistance required.  Pt continues to c/o Bil knee pain Rt> Lt average pain scale 3-4/10.  Pt does continues to demonstrate weak LE musculature and will continue to benefit from skilled intervention to address  weaknesses and balance training on dynamic surfaces to reduce risk of fall.  Session focus today on functional strengthening, able to increase reps and height with stair training with no c/o increased pain.     Rehab Potential Good   Clinical Impairments Affecting Rehab Potential Arthritis in knees   PT Frequency 2x / week   PT Duration 6 weeks   PT Treatment/Interventions ADLs/Self Care Home Management;Biofeedback;Moist Heat;Gait training;DME Instruction;Stair training;Functional mobility training;Therapeutic activities;Therapeutic exercise;Balance training;Patient/family education   PT Next Visit Plan Continue to progress functional strenghtening and balance training on dynamic surfaces.     PT Home Exercise Plan Reviewed HEP with appropriate exercises, continue current HEP      Patient will benefit from skilled therapeutic intervention in order to improve the following deficits and impairments:  Abnormal gait, Decreased activity tolerance, Decreased balance, Decreased endurance, Decreased mobility, Decreased strength, Difficulty walking, Increased edema, Obesity, Pain  Visit Diagnosis: Muscle weakness (generalized)  Pain In Right Leg  Pain In Left Leg    Problem List Patient Active Problem List   Diagnosis Date Noted  . Chronic myelogenous leukemia (CML), BCR-ABL1-positive ([place]) 11/20/2015  . Biliary dyskinesia 12/04/2013  . Dyspepsia 11/30/2012  . Rib pain 11/01/2011  . Colon cancer screening 09/21/2011  . Lipoma 07/27/2011  . Lipoma of arm 07/27/2011  .  Mass of arm 07/08/2011  . PUD 05/20/2010  . ABDOMINAL PAIN RIGHT UPPER QUADRANT 02/12/2010  . UNSPECIFIED DISORDER OF THYROID 02/11/2010  . NAUSEA 02/11/2010  . Emiliano Dyer OF 02/11/2010   Ihor Austin, LPTA; CBIS [phone]      G-Codes - 06-26-2016 1821    Functional Assessment Tool Used Clinical Assessment via strength testing and functional testing.    Functional Limitation Mobility: Walking and moving around    Mobility: Walking and Moving Around Current Status [phone]) At least 20 percent but less than 40 percent impaired, limited or restricted   Mobility: Walking and Moving Around Goal Status [phone]) At least 1 percent but less than 20 percent impaired, limited or restricted      Deniece Ree PT, DPT [place], [place], [address] Phone: [phone]   Fax:  [phone]  Name: Andrea Martinez MRN: [id number] Date of Birth: May 29, 1946

## 2016-06-11 ENCOUNTER — Encounter (HOSPITAL_COMMUNITY): Payer: Medicare Other

## 2016-06-11 ENCOUNTER — Encounter (HOSPITAL_BASED_OUTPATIENT_CLINIC_OR_DEPARTMENT_OTHER): Payer: Medicare Other | Admitting: Hematology & Oncology

## 2016-06-11 VITALS — BP 155/63 | HR 67 | Temp 98.2°F | Resp 18 | Wt 194.0 lb

## 2016-06-11 DIAGNOSIS — R21 Rash and other nonspecific skin eruption: Secondary | ICD-10-CM

## 2016-06-11 DIAGNOSIS — F418 Other specified anxiety disorders: Secondary | ICD-10-CM | POA: Diagnosis not present

## 2016-06-11 DIAGNOSIS — C9210 Chronic myeloid leukemia, BCR/ABL-positive, not having achieved remission: Secondary | ICD-10-CM

## 2016-06-11 DIAGNOSIS — R110 Nausea: Secondary | ICD-10-CM

## 2016-06-11 DIAGNOSIS — D72829 Elevated white blood cell count, unspecified: Secondary | ICD-10-CM | POA: Diagnosis not present

## 2016-06-11 DIAGNOSIS — D649 Anemia, unspecified: Secondary | ICD-10-CM

## 2016-06-11 LAB — COMPREHENSIVE METABOLIC PANEL
ALBUMIN: 4.1 g/dL (ref 3.5–5.0)
ALK PHOS: 156 U/L — AB (ref 38–126)
ALT: 23 U/L (ref 14–54)
ANION GAP: 6 (ref 5–15)
AST: 35 U/L (ref 15–41)
BUN: 18 mg/dL (ref 6–20)
CHLORIDE: 105 mmol/L (ref 101–111)
CO2: 26 mmol/L (ref 22–32)
Calcium: 8.7 mg/dL — ABNORMAL LOW (ref 8.9–10.3)
Creatinine, Ser: 0.64 mg/dL (ref 0.44–1.00)
GFR calc Af Amer: >60 mL/min (ref >60)
GFR calc non Af Amer: >60 mL/min (ref >60)
GLUCOSE: 98 mg/dL (ref 65–99)
POTASSIUM: 4 mmol/L (ref 3.5–5.1)
Sodium: 137 mmol/L (ref 135–145)
Total Bilirubin: 1.2 mg/dL (ref 0.3–1.2)
Total Protein: 7.3 g/dL (ref 6.5–8.1)

## 2016-06-11 LAB — CBC WITH DIFFERENTIAL/PLATELET
BASOS PCT: 0 %
Basophils Absolute: 0 10*3/uL (ref 0.0–0.1)
EOS ABS: 0.2 10*3/uL (ref 0.0–0.7)
EOS PCT: 3 %
HCT: 34.4 % — ABNORMAL LOW (ref 36.0–46.0)
HEMOGLOBIN: 10.8 g/dL — AB (ref 12.0–15.0)
Lymphocytes Relative: 19 %
Lymphs Abs: 1.4 10*3/uL (ref 0.7–4.0)
MCH: 26.1 pg (ref 26.0–34.0)
MCHC: 31.4 g/dL (ref 30.0–36.0)
MCV: 83.1 fL (ref 78.0–100.0)
MONOS PCT: 8 %
Monocytes Absolute: 0.6 10*3/uL (ref 0.1–1.0)
NEUTROS PCT: 70 %
Neutro Abs: 5.3 10*3/uL (ref 1.7–7.7)
Platelets: 272 10*3/uL (ref 150–400)
RBC: 4.14 MIL/uL (ref 3.87–5.11)
RDW: 16.3 % — AB (ref 11.5–15.5)
WBC: 7.5 10*3/uL (ref 4.0–10.5)

## 2016-06-11 LAB — PHOSPHORUS: Phosphorus: 3.3 mg/dL (ref 2.5–4.6)

## 2016-06-11 LAB — LACTATE DEHYDROGENASE: LDH: 176 U/L (ref 98–192)

## 2016-06-11 LAB — MAGNESIUM: Magnesium: 2.1 mg/dL (ref 1.7–2.4)

## 2016-06-11 NOTE — Patient Instructions (Signed)
[place] at [facility] Discharge Instructions  RECOMMENDATIONS MADE BY THE CONSULTANT AND ANY TEST RESULTS WILL BE SENT TO YOUR REFERRING PHYSICIAN.  Exam done and seen today by Dr. Whitney Muse Labs reviewed today Labs in 6 weeks Return to see the doctor in September with labs Please call the clinic if you have any questions or concerns   Thank you for choosing [place] at [facility] to provide your oncology and hematology care.  To afford each patient quality time with our provider, please arrive at least 15 minutes before your scheduled appointment time.   Beginning January 23rd 2017 lab work for the [facility] will be done in the  Main lab at [facility] on 1st floor. If you have a lab appointment with the [facility] please come in thru the  Main Entrance and check in at the main information desk  You need to re-schedule your appointment should you arrive 10 or more minutes late.  We strive to give you quality time with our providers, and arriving late affects you and other patients whose appointments are after yours.  Also, if you no show three or more times for appointments you may be dismissed from the clinic at the providers discretion.     Again, thank you for choosing [facility].  Our hope is that these requests will decrease the amount of time that you wait before being seen by our physicians.       _____________________________________________________________  Should you have questions after your visit to [facility], please contact our office at [phone] between the hours of 8:30 a.m. and 4:30 p.m.  Voicemails left after 4:30 p.m. will not be returned until the following business day.  For prescription refill requests, have your pharmacy contact our office.         Resources For Cancer Patients and their Caregivers ? American Cancer Society: Can assist with transportation, wigs, general  needs, runs Look Good Feel Better.        6183181770 ? Cancer Care: Provides financial assistance, online support groups, medication/co-pay assistance.  1-800-813-HOPE 857-122-6949) ? [facility] Assists [place] cancer patients and their families through emotional , educational and financial support.  (515)602-9484 ? [place] DSS Where to apply for food stamps, Medicaid and utility assistance. (281)580-0617 ? [facility]: Transportation to medical appointments. (719)585-6569 ? Social Security Administration: May apply for disability if have a Stage IV cancer. 442 847 9666 (775)149-1195 ? [employer], Disability and Transit Services: Assists with nutrition, care and transit needs. [place] Support Programs: @10RELATIVEDAYS @ > Cancer Support Group  2nd Tuesday of the month 1pm-2pm, Journey Room  > Creative Journey  3rd Tuesday of the month 1130am-1pm, Journey Room  > Look Good Feel Better  1st Wednesday of the month 10am-12 noon, Journey Room (Call [facility] to register [phone])

## 2016-06-11 NOTE — Progress Notes (Signed)
[place] at [facility] Hematology/Oncology Progress Note  Name: Andrea Martinez      MRN: [id number]   Date: 06/11/2016 Time:12:37 PM   REFERRING PHYSICIAN:  Sinda Du, MD   DIAGNOSIS:  CML    HISTORY OF PRESENT ILLNESS:   Andrea Martinez is a 70 year old white American female here for additional f/u of CML.  Andrea Martinez is accompanied by her husband.  She is feeling well and her husband admits she is doing better. States she needs to get her strength up to do better. Her husband notes she does a lot of exercise at home and around town. She has had 6 sessions with physical therapy so far and is told she is doing above what is expected at this time. She and her husband have been starting to go out to eat more and have been attending group support sessions.  She is having some issues with taking her cancer pill with a large glass of water at night and having to get up to urinate in the night. She drinks 8 oz with each pill.  She reports a strange taste with everything she puts in her mouth and was curious if this is due to the chemotherapy pill. She denies an increase to allergies as she has not been outside as much.  She reports night sweats which occur during the day.   When asked how she feels in comparison to 6 weeks ago, she believes her blood pressure has increased.  She reports knee pain.   Her rash continues to come and go. His husband states it is 10 times better than it was. She notes the cream helps. She has never had the rash on the front of her body, "just the fat parts".   Appetite is good.    PAST MEDICAL HISTORY:   Past Medical History  Diagnosis Date  . Gastric ulcer 2011    EGD, 5/11  . Hypothyroidism     not on meds, followed by Dr. Elyse Hsu  . PONV (postoperative nausea and vomiting)   . Lipoma     left upper arm  . UTI (lower urinary tract infection)     frequent  . Leukocytosis 11/20/2015  . Chronic myelogenous leukemia  (CML), BCR-ABL1-positive ([place]) 11/20/2015    ALLERGIES: Allergies  Allergen Reactions  . Codeine Nausea Only    Headache  . Esomeprazole Magnesium Nausea Only  . Sulfonamide Derivatives Nausea Only    Headache  . Fluconazole Rash    Redness and blistering on left thigh      MEDICATIONS: I have reviewed the patient's current medications.    Current Outpatient Prescriptions on File Prior to Visit  Medication Sig Dispense Refill  . acetaminophen (TYLENOL) 500 MG tablet Take 500 mg by mouth every 6 (six) hours as needed.    . ALPRAZolam (XANAX) 0.25 MG tablet Take 1 tablet (0.25 mg total) by mouth every 6 (six) hours as needed for anxiety. 30 tablet 1  . benzonatate (TESSALON) 200 MG capsule Take 1 capsule (200 mg total) by mouth 3 (three) times daily as needed for cough. 45 capsule 2  . cetirizine (ZYRTEC) 10 MG tablet Take 10 mg by mouth as needed for allergies.    . Cholecalciferol (VITAMIN D-3) 5000 UNITS TABS Take 5,000 Units by mouth daily.      . Cholestyramine Light (PREVALITE PO) Take 4 g by mouth as needed. Reported on 02/13/2016    .  conjugated estrogens (PREMARIN) vaginal cream Place 1 Applicatorful vaginally 2 (two) times a week. 1/2 to 1g    . escitalopram (LEXAPRO) 20 MG tablet Take 1 tablet (20 mg total) by mouth daily. 30 tablet 2  . Multiple Vitamins-Minerals (CENTRUM SILVER PO) Take 1 capsule by mouth daily.    . nilotinib (TASIGNA) 150 MG capsule Take 2 capsules (300 mg total) by mouth every 12 (twelve) hours. 120 capsule 11  . ondansetron (ZOFRAN) 8 MG tablet Take 1 tablet (8 mg total) by mouth every 8 (eight) hours as needed for nausea or vomiting. 30 tablet 2  . Probiotic Product (DIGESTIVE ADVANTAGE GUMMIES PO) Take 2 tablets by mouth daily.    . prochlorperazine (COMPAZINE) 10 MG tablet Take 1 tablet (10 mg total) by mouth every 6 (six) hours as needed for nausea or vomiting. 60 tablet 2  . promethazine (PHENERGAN) 25 MG tablet Take 1 tablet (25 mg total) by mouth  every 6 (six) hours as needed for nausea or vomiting. 60 tablet 2  . triamcinolone (NASACORT ALLERGY 24HR) 55 MCG/ACT AERO nasal inhaler Place 2 sprays into the nose as needed.    . triamcinolone cream (KENALOG) 0.1 % Apply 1 application topically 2 (two) times daily. 453.6 g 0  . triamcinolone cream (KENALOG) 0.1 % Apply 1 application topically 2 (two) times daily. 80 g 1  . vitamin B-12 (CYANOCOBALAMIN) 1000 MCG tablet Take 5,000 mcg by mouth daily.      No current facility-administered medications on file prior to visit.     PAST SURGICAL HISTORY Past Surgical History  Procedure Laterality Date  . Esophagogastroduodenoscopy  02/2010    gastric ulcers  . Partial hysterectomy      vaginal at age 33 years of age  . Nose surgery    . Lipoma excision  08/02/2011    left shoulder  . Colonoscopy  01/2004    DR [facility], few small tics  . Esophagogastroduodenoscopy  12/04/2012    [id number] gastritis (inflammation) was found in the gastric antrum; multiple biopsies The duodenal mucosa showed no abnormalities in the bulb and second portion of the duodenum  . Bravo ph study  12/04/2012    Procedure: BRAVO [place];  Surgeon: [name], MD;  [facility];  Service: Endoscopy;  Laterality: N/A;  . Toe debridement  1962    lt great toe  . Cholecystectomy N/A 01/03/2014    Procedure: LAPAROSCOPIC CHOLECYSTECTOMY WITH INTRAOPERATIVE CHOLANGIOGRAM;  Surgeon: Joyice Faster. Cornett, MD;  Location: [place];  Service: General;  Laterality: N/A;    FAMILY HISTORY: Family History  Problem Relation Age of Onset  . Lung cancer Father   . COPD Mother   . Colon cancer Neg Hx   . Anesthesia problems Neg Hx   . Hypotension Neg Hx   . Malignant hyperthermia Neg Hx   . Pseudochol deficiency Neg Hx     SOCIAL HISTORY:  reports that she has never smoked. She has never used smokeless tobacco. She reports that she does not drink alcohol or use illicit drugs.  PERFORMANCE  STATUS: The patient's performance status is 1 - Symptomatic but completely ambulatory  REVIEW OF SYSTEMS: Positive for anxiety. Positive for taste change. Positive for falls and knee pain. Positive for nocturia and night sweats. She drinks 8 oz water before she goes to sleep while taking her chemotherapy pill.  14 point review of systems was performed and is negative except as detailed under history of present illness  and above    PHYSICAL EXAM: Most Recent Vital Signs: Blood pressure 155/63, pulse 67, temperature 98.2 F (36.8 C), temperature source Oral, resp. rate 18, weight 194 lb (87.998 kg), SpO2 97 %. General appearance: alert, cooperative, appears stated age, fatigued, no distress, moderately obese , anxious, and accompanied by her husband Head: Normocephalic, without obvious abnormality, atraumatic Eyes: negative findings: lids and lashes normal, conjunctivae and sclerae normal, corneas clear and pupils equal, round, reactive to light and accomodation Throat: lips, mucosa, and tongue normal; teeth and gums normal Neck: no adenopathy and supple, symmetrical, trachea midline Lungs: clear to auscultation bilaterally and normal percussion bilaterally Heart: regular rate and rhythm, S1, S2 normal, no murmur, click, rub or gallop Abdomen: normal findings: bowel sounds normal, liver span normal to percussion, no bruits heard, no masses palpable, no organomegaly, no renal abnormalities palpable, spleen non-palpable and symmetric and abnormal findings:  mild tenderness in the lower abdomen Extremities: Homans sign is negative, no sign of DVT and no edema, redness or tenderness in the calves or thighs Skin: Skin color, texture, turgor normal.  Lymph nodes: Cervical, supraclavicular, and axillary nodes normal. Neurologic: Grossly normal   LABORATORY DATA:  I have reviewed the data as listed.   LDH 98 - 192 U/L 141 136 153 163        Magnesium 1.7 - 2.4 mg/dL 2.2 2.2 2.1 2.1          Phosphorus 2.5 - 4.6 mg/dL 3.7 3.7 3.4 3.7       CBC    Component Value Date/Time   WBC 7.5 06/11/2016 1102   RBC 4.14 06/11/2016 1102   RBC 2.95* 02/06/2016 1238   HGB 10.8* 06/11/2016 1102   HCT 34.4* 06/11/2016 1102   PLT 272 06/11/2016 1102   MCV 83.1 06/11/2016 1102   MCH 26.1 06/11/2016 1102   MCHC 31.4 06/11/2016 1102   RDW 16.3* 06/11/2016 1102   LYMPHSABS 1.4 06/11/2016 1102   MONOABS 0.6 06/11/2016 1102   EOSABS 0.2 06/11/2016 1102   BASOSABS 0.0 06/11/2016 1102   CMP     Component Value Date/Time   NA 137 06/11/2016 1102   K 4.0 06/11/2016 1102   CL 105 06/11/2016 1102   CO2 26 06/11/2016 1102   GLUCOSE 98 06/11/2016 1102   BUN 18 06/11/2016 1102   CREATININE 0.64 06/11/2016 1102   CREATININE 0.87 07/14/2011 0938   CALCIUM 8.7* 06/11/2016 1102   PROT 7.3 06/11/2016 1102   ALBUMIN 4.1 06/11/2016 1102   AST 35 06/11/2016 1102   ALT 23 06/11/2016 1102   ALKPHOS 156* 06/11/2016 1102   BILITOT 1.2 06/11/2016 1102   GFRNONAA >60 06/11/2016 1102   GFRAA >60 06/11/2016 1102    PATHOLOGY:        ASSESSMENT/PLAN:  CML Anemia Tasigna 01/06/2016   She is to continue on tasigna as prescribed. She has finally gone to a support group and actually enjoyed it. She is benefiting from PT. She will be due for PCR again in the near future.   H/H continue to improve. B12 and folate were WNL recently. Iron studies need to be rechecked and will be added on to next lab draw.   Depression/Adjustment Disorder  She is to continue on her lexapro.   Nausea  I have written her for phenergan and compazine to try as needed for her nausea.   EKG change  She has seen cardiology and will follow-up in one month.   RASH  Triamcinolone cream.  Most  likely from TKI therapy and mild.  Monitoring Response to TKI therapy  Bone marrow cytogenetics: at diagnosis, failure to reach response milestones and any sign of loss of response (defined as hematologic or cytogenetic  relapse)  Quantitative RT-PCR using is preferred: at diagnosis, every 3 months after initiating treatment, after BCR-ABL1 0.1% - , 1% (IS) has been achieved, every 3 months for 2 years and every 3 - 6 months thereafter. If there is 1 log increase in BCR-ABL1 transcript levels with MMR, QPCR should be repeated in 1 - 3 months  BCR-ABL kinase domain mutation analysis: Chronic phase: failure to reach response milestones, any sign of loss of response (defined as hematologic or cytogenetic relapse) 1 - log increase in BCR-ABL1 transcript levels and loss of MMR. Disease progression to accelerated or blast phase  Criteria for Hematologic and molecular response   Complete Hematologic response Complete normalization of peripheral blood counts with leukocyte count ,10x103/L Platelet count < 450 x 109/L NO immature cells such as myelocytes, promyelocytes or blasts in peripheral blood No signs and symptoms of disease with disappearance of palpable splenomegaly  Molecular response Early molecular response  BCR-ABL1<= 10% (IS) at 3 and 6 months Major molecular response BCR-ABL1,0.1%  (IS) or > = 3 log reduction in BCR-ABL17mNA from the standardized baseline  Complete molecular response no detectable BCR-ABL157mA using a QPCR assay with a sensitivity of at least 4.5 logs below the standardized baseline  She will return for blood work in 6 weeks and follow up in 3 months.   Orders Placed This Encounter  Procedures  . CBC with Differential    Standing Status:   Future    Standing Expiration Date:   06/11/2017  . Comprehensive metabolic panel    Standing Status:   Future    Standing Expiration Date:   06/11/2017  . Phosphorus    Standing Status:   Future    Standing Expiration Date:   06/11/2017  . CBC with Differential    Standing Status:   Future    Standing Expiration Date:   06/11/2017  . Comprehensive metabolic panel    Standing Status:   Future    Standing Expiration Date:   06/11/2017  . EKG  12-Lead    Standing Status:   Future    Standing Expiration Date:   06/11/2017    Order Specific Question:   Where should this test be performed    Answer:   [facility]     All questions were answered. The patient knows to call the clinic with any problems, questions or concerns. We can certainly see the patient much sooner if necessary.  This document serves as a record of services personally performed by ShAncil LinseyMD. It was created on her behalf by ElArlyce Harmana trained medical scribe. The creation of this record is based on the scribe's personal observations and the provider's statements to them. This document has been checked and approved by the attending provider.  I have reviewed the above documentation for accuracy and completeness, and I agree with the above.  This note is electronically signed by:  PeMolli HazardMD  06/11/2016 12:37 PM

## 2016-06-14 ENCOUNTER — Other Ambulatory Visit (HOSPITAL_COMMUNITY): Payer: Self-pay | Admitting: *Deleted

## 2016-06-14 ENCOUNTER — Ambulatory Visit (HOSPITAL_COMMUNITY): Payer: Medicare Other | Attending: Hematology & Oncology | Admitting: Physical Therapy

## 2016-06-14 DIAGNOSIS — C9210 Chronic myeloid leukemia, BCR/ABL-positive, not having achieved remission: Secondary | ICD-10-CM

## 2016-06-14 DIAGNOSIS — M79605 Pain in left leg: Secondary | ICD-10-CM | POA: Diagnosis not present

## 2016-06-14 DIAGNOSIS — M6281 Muscle weakness (generalized): Secondary | ICD-10-CM

## 2016-06-14 DIAGNOSIS — M79604 Pain in right leg: Secondary | ICD-10-CM | POA: Insufficient documentation

## 2016-06-14 NOTE — Therapy (Signed)
Andrea Martinez, [place], [address] Phone: [phone]   Fax:  [phone]  Physical Therapy Treatment  Patient Details  Name: Andrea Martinez MRN: [id number] Date of Birth: 12-25-1945 Referring Provider: Dr. Ancil Linsey  Encounter Date: 06/14/2016      PT End of Session - 06/14/16 1343    Visit Number 7   Number of Visits 12   Date for PT Re-Evaluation 07/05/16   Authorization Type [employer] Medicare   Authorization Time Period 05/25/2016 - 06/29/2016   Authorization - Visit Number 7   Authorization - Number of Visits 10   PT Start Time [id number]   PT Stop Time 1343   PT Time Calculation (min) 40 min   Activity Tolerance Patient tolerated treatment well   Behavior During Therapy [facility] for tasks assessed/performed      Past Medical History  Diagnosis Date  . Gastric ulcer 2011    EGD, 5/11  . Hypothyroidism     not on meds, followed by Dr. Elyse Hsu  . PONV (postoperative nausea and vomiting)   . Lipoma     left upper arm  . UTI (lower urinary tract infection)     frequent  . Leukocytosis 11/20/2015  . Chronic myelogenous leukemia (CML), BCR-ABL1-positive ([place]) 11/20/2015    Past Surgical History  Procedure Laterality Date  . Esophagogastroduodenoscopy  02/2010    gastric ulcers  . Partial hysterectomy      vaginal at age 70 years of age  . Nose surgery    . Lipoma excision  08/02/2011    left shoulder  . Colonoscopy  01/2004    DR [facility], few small tics  . Esophagogastroduodenoscopy  12/04/2012    [id number] gastritis (inflammation) was found in the gastric antrum; multiple biopsies The duodenal mucosa showed no abnormalities in the bulb and second portion of the duodenum  . Bravo ph study  12/04/2012    Procedure: BRAVO [place];  Surgeon: [name], MD;  [facility];  Service: Endoscopy;  Laterality: N/A;  . Toe debridement  1962    lt great toe  . Cholecystectomy N/A 01/03/2014     Procedure: LAPAROSCOPIC CHOLECYSTECTOMY WITH INTRAOPERATIVE CHOLANGIOGRAM;  Surgeon: Joyice Faster. Cornett, MD;  Location: [place];  Service: General;  Laterality: N/A;    There were no vitals filed for this visit.      Subjective Assessment - 06/14/16 1305    Subjective Patient arrives stating that she is doing well; she feels that she is doing pretty good . No falls or close calls recently.    Pertinent History prolapsed  bladder - no treadmill due to incontinence, arthritis in knees - pt states she needs TKA's, but not able to get them yet because of the CML   Currently in Pain? Yes   Pain Score 3    Pain Location Knee   Pain Orientation Right;Left                         [facility] Adult PT Treatment/Exercise - 06/14/16 0001    Knee/Hip Exercises: Standing   Heel Raises 20 reps   Heel Raises Limitations toe raises with cueing for form   Forward Lunges Both;1 set;15 reps   Forward Lunges Limitations 4 inch box    Hip Abduction Both;15 reps   Abduction Limitations RTB resistance with 2" holds   Lateral Step Up Both;1 set;15 reps   Lateral Step  Up Limitations 4 inch box    Forward Step Up Both;1 set;15 reps   Forward Step Up Limitations 6 inch box              Balance Exercises - 06/14/16 1307    Balance Exercises: Standing   Standing Eyes Closed Narrow base of support (BOS);Foam/compliant surface;3 reps;20 secs   Tandem Stance Eyes open;Foam/compliant surface;3 reps;15 secs   SLS Solid surface;Eyes open;3 reps;15 secs   Rockerboard Anterior/posterior;Lateral;Intermittent UE support;Other (comment)  2 minutes each way            PT Education - 06/14/16 1343    Education provided No          PT Short Term Goals - 06/10/16 1610    PT SHORT TERM GOAL #1   Title Pt will report consistency with HEP for strengthening to improve overal strength for functional mobility.    Baseline 06/10/2016:  Reports compliance with HEP throughout every  day   Status Achieved   PT SHORT TERM GOAL #2   Title Pt will demonstrate improvement in LE MMT by 1/2 grade to improve balance, and functional activities with decrease in pain.    Baseline 06/10/2016:  Improved 1/2 grade majority of MMT   Status Achieved   PT SHORT TERM GOAL #3   Title Pt will report 5/5 fall precautions to ensure pt understands what situations can place her at risk for falling.    Baseline 06/10/2106: Able to state fall precautions to reduce risk of fall   Status Achieved   PT SHORT TERM GOAL #4   Title Pt will demonstrate ability to perform SLS for 10 seconds on each foot to improve balance, and reduce risk for falling.    Baseline 06/10/2016:    Status Achieved           PT Long Term Goals - 06/10/16 1616    PT LONG TERM GOAL #1   Title Pt will report <2/5 pain in B knees for 70% of the time to  improve pt's QOL, and increase activity tolerance.    Baseline 06/10/2016:  Bil Rt > Lt pain scale range 3-4/10 with 1 pain medication assistance   Time 6   Period Weeks   Status On-going   PT LONG TERM GOAL #2   Title Pt will perform 5 time sit<>stand in less than 10 seconds with no UE support to demonstrate improvement with functional strength and endurance .    Baseline 06/10/2016:  5 STS no UE A 7.74"   Status Achieved   PT LONG TERM GOAL #3   Title Pt will demonstrate an increase in at least 1 MMT grade to improve her balance, endurance and decreased pain for functional activities.    Baseline 06/10/2016: ongoing   Time 6   Period Weeks   Status On-going   PT LONG TERM GOAL #4   Title Pt will demonstrate ability to perform SLS for 15 sec on each LE to demonstrate improved balance and decreased risk of falls.    Baseline 06/08/2016: Able to complete Rt 30", Lt 20", 06/10/2016: Rt 21", Lt 13"   Status Achieved               Plan - 06/14/16 1328    Clinical Impression Statement Patient arrives reporting that she has been doing well, however she is not  yet where she'd like to be. Continued work on functional balance exercises as well as functional strengthening today with cues for form.  Patient continues to be very motivated and participatory, and responds well to cues for form and safety during today's session. Recommend continuing to progress balance/strength exercises as tolerated in order to continue working towards functional goals.    Rehab Potential Good   Clinical Impairments Affecting Rehab Potential Arthritis in knees   PT Frequency 2x / week   PT Duration 6 weeks   PT Treatment/Interventions ADLs/Self Care Home Management;Biofeedback;Moist Heat;Gait training;DME Instruction;Stair training;Functional mobility training;Therapeutic activities;Therapeutic exercise;Balance training;Patient/family education   PT Next Visit Plan Continue to progress functional strenghtening and balance training on dynamic surfaces.     Consulted and Agree with Plan of Care Patient   Family Member Consulted husband      Patient will benefit from skilled therapeutic intervention in order to improve the following deficits and impairments:  Abnormal gait, Decreased activity tolerance, Decreased balance, Decreased endurance, Decreased mobility, Decreased strength, Difficulty walking, Increased edema, Obesity, Pain  Visit Diagnosis: Muscle weakness (generalized)  Pain In Right Leg  Pain In Left Leg     Problem List Patient Active Problem List   Diagnosis Date Noted  . Chronic myelogenous leukemia (CML), BCR-ABL1-positive ([place]) 11/20/2015  . Biliary dyskinesia 12/04/2013  . Dyspepsia 11/30/2012  . Rib pain 11/01/2011  . Colon cancer screening 09/21/2011  . Lipoma 07/27/2011  . Lipoma of arm 07/27/2011  . Mass of arm 07/08/2011  . PUD 05/20/2010  . ABDOMINAL PAIN RIGHT UPPER QUADRANT 02/12/2010  . UNSPECIFIED DISORDER OF THYROID 02/11/2010  . NAUSEA 02/11/2010  . Emiliano Dyer OF 02/11/2010    Deniece Ree PT, DPT [place] [address] [place], [place], [address] Phone: [phone]   Fax:  [phone]  Name: LEAHA RODELL MRN: [id number] Date of Birth: 1946/05/09

## 2016-06-17 ENCOUNTER — Ambulatory Visit (HOSPITAL_COMMUNITY): Payer: Medicare Other | Admitting: Physical Therapy

## 2016-06-17 DIAGNOSIS — M79604 Pain in right leg: Secondary | ICD-10-CM | POA: Diagnosis not present

## 2016-06-17 DIAGNOSIS — M6281 Muscle weakness (generalized): Secondary | ICD-10-CM

## 2016-06-17 DIAGNOSIS — M79605 Pain in left leg: Secondary | ICD-10-CM | POA: Diagnosis not present

## 2016-06-17 NOTE — Therapy (Signed)
[place], [place], [address] Phone: [phone]   Fax:  [phone]  Physical Therapy Treatment  Patient Details  Name: Andrea Martinez MRN: [id number] Date of Birth: August 22, 1946 Referring Provider: Dr. Ancil Linsey  Encounter Date: 06/17/2016      PT End of Session - 06/17/16 1324    Visit Number 8   Number of Visits 12   Date for PT Re-Evaluation 07/05/16   Authorization Type U[employer] Medicare   Authorization Time Period 05/25/2016 - 06/29/2016   Authorization - Visit Number 8   Authorization - Number of Visits 10   PT Start Time 1300   PT Stop Time 1342   PT Time Calculation (min) 42 min   Activity Tolerance Patient tolerated treatment well   Behavior During Therapy [facility] for tasks assessed/performed      Past Medical History  Diagnosis Date  . Gastric ulcer 2011    EGD, 5/11  . Hypothyroidism     not on meds, followed by Dr. Elyse Hsu  . PONV (postoperative nausea and vomiting)   . Lipoma     left upper arm  . UTI (lower urinary tract infection)     frequent  . Leukocytosis 11/20/2015  . Chronic myelogenous leukemia (CML), BCR-ABL1-positive ([place]) 11/20/2015    Past Surgical History  Procedure Laterality Date  . Esophagogastroduodenoscopy  02/2010    gastric ulcers  . Partial hysterectomy      vaginal at age 70 years of age  . Nose surgery    . Lipoma excision  08/02/2011    left shoulder  . Colonoscopy  01/2004    [facility], few small tics  . Esophagogastroduodenoscopy  12/04/2012    [id number] gastritis (inflammation) was found in the gastric antrum; multiple biopsies The duodenal mucosa showed no abnormalities in the bulb and second portion of the duodenum  . Bravo ph study  12/04/2012    Procedure: BRAVO [place];  Surgeon: [name], MD;  [facility];  Service: Endoscopy;  Laterality: N/A;  . Toe debridement  1962    lt great toe  . Cholecystectomy N/A 01/03/2014   Procedure: LAPAROSCOPIC CHOLECYSTECTOMY WITH INTRAOPERATIVE CHOLANGIOGRAM;  Surgeon: Joyice Faster. Cornett, MD;  Location: [place];  Service: General;  Laterality: N/A;    There were no vitals filed for this visit.      Subjective Assessment - 06/17/16 1301    Subjective Pt states she has been doing her exercises without difficulty     Pertinent History prolapsed  bladder - no treadmill due to incontinence, arthritis in knees - pt states she needs TKA's, but not able to get them yet because of the CML   Pain Score 3    Pain Location Knee   Pain Orientation Right;Left   Pain Descriptors / Indicators Aching   Pain Type Chronic pain   Pain Onset More than a month ago   Pain Frequency Constant   Aggravating Factors  Weght bearing    Pain Relieving Factors pain medication    Effect of Pain on Daily Activities increases                         [facility] Adult PT Treatment/Exercise - 06/17/16 0001    Balance Poses: Yoga   Tree Pose 2 reps;15 seconds   Exercises   Exercises Knee/Hip   Knee/Hip Exercises: Standing   Heel Raises 15 reps   Heel  Raises Limitations no hands     Forward Lunges Both;1 set;15 reps   Forward Lunges Limitations 4 inch box    Side Lunges Limitations 10   Lateral Step Up Both;10 reps;Hand Hold: 1;Step Height: 6"   Forward Step Up Left;15 reps;Hand Hold: 1;Step Height: 6"   Functional Squat 15 reps   SLS with Vectors 3x 15   Other Standing Knee Exercises marching in place x 10 ; tandem stance    Other Standing Knee Exercises sidestep with GTB 2RT   Knee/Hip Exercises: Seated   Sit to Sand 10 reps;without UE support;Other (comment)  rt leg in back; 10 Lt  leg in back                 PT Education - 06/17/16 1330    Education provided Yes   Education Details HEP   Person(s) Educated Patient   Methods Explanation;Handout;Verbal cues;Demonstration   Comprehension Verbalized understanding          PT Short Term Goals -  06/17/16 1330    PT SHORT TERM GOAL #1   Title Pt will report consistency with HEP for strengthening to improve overal strength for functional mobility.    Baseline 06/10/2016:  Reports compliance with HEP throughout every day   Status Achieved   PT SHORT TERM GOAL #2   Title Pt will demonstrate improvement in LE MMT by 1/2 grade to improve balance, and functional activities with decrease in pain.    Baseline 06/10/2016:  Improved 1/2 grade majority of MMT   Status Achieved   PT SHORT TERM GOAL #3   Title Pt will report 5/5 fall precautions to ensure pt understands what situations can place her at risk for falling.    Baseline 06/10/2106: Able to state fall precautions to reduce risk of fall   Status Achieved   PT SHORT TERM GOAL #4   Title Pt will demonstrate ability to perform SLS for 10 seconds on each foot to improve balance, and reduce risk for falling.    Baseline 06/10/2016:    Status Achieved           PT Long Term Goals - 06/17/16 1331    PT LONG TERM GOAL #1   Title Pt will report <2/5 pain in B knees for 70% of the time to  improve pt's QOL, and increase activity tolerance.    Baseline 06/10/2016:  Bil Rt > Lt pain scale range 3-4/10 with 1 pain medication assistance   Time 6   Period Weeks   Status On-going   PT LONG TERM GOAL #2   Title Pt will perform 5 time sit<>stand in less than 10 seconds with no UE support to demonstrate improvement with functional strength and endurance .    Baseline 06/10/2016:  5 STS no UE A 7.74"   Status Achieved   PT LONG TERM GOAL #3   Title Pt will demonstrate an increase in at least 1 MMT grade to improve her balance, endurance and decreased pain for functional activities.    Baseline 06/10/2016: ongoing   Time 6   Period Weeks   Status On-going   PT LONG TERM GOAL #4   Title Pt will demonstrate ability to perform SLS for 15 sec on each LE to demonstrate improved balance and decreased risk of falls.    Baseline 06/08/2016: Able to  complete Rt 30", Lt 20", 06/10/2016: Rt 21", Lt 13"   Status Achieved  Plan - 06/17/16 1325    Clinical Impression Statement Added sit to stand , marching and vector stances to program as well as increasing resistance with side stepping activity.  Pt is very eager to do whatever she can do at home to get stronger.   Rehab Potential Good   Clinical Impairments Affecting Rehab Potential Arthritis in knees   PT Frequency 2x / week   PT Duration 6 weeks   PT Treatment/Interventions ADLs/Self Care Home Management;Biofeedback;Moist Heat;Gait training;DME Instruction;Stair training;Functional mobility training;Therapeutic activities;Therapeutic exercise;Balance training;Patient/family education   PT Next Visit Plan Continue to progress functional strenghtening and balance training on dynamic surfaces.     Consulted and Agree with Plan of Care Patient   Family Member Consulted husband      Patient will benefit from skilled therapeutic intervention in order to improve the following deficits and impairments:  Abnormal gait, Decreased activity tolerance, Decreased balance, Decreased endurance, Decreased mobility, Decreased strength, Difficulty walking, Increased edema, Obesity, Pain  Visit Diagnosis: Muscle weakness (generalized)  Pain In Right Leg  Pain In Left Leg     Problem List Patient Active Problem List   Diagnosis Date Noted  . Chronic myelogenous leukemia (CML), BCR-ABL1-positive ([place]) 11/20/2015  . Biliary dyskinesia 12/04/2013  . Dyspepsia 11/30/2012  . Rib pain 11/01/2011  . Colon cancer screening 09/21/2011  . Lipoma 07/27/2011  . Lipoma of arm 07/27/2011  . Mass of arm 07/08/2011  . PUD 05/20/2010  . ABDOMINAL PAIN RIGHT UPPER QUADRANT 02/12/2010  . UNSPECIFIED DISORDER OF THYROID 02/11/2010  . NAUSEA 02/11/2010  . Emiliano Dyer OF 02/11/2010   Rayetta Humphrey, PT CLT [phone] 06/17/2016, 1:44 PM  [place] [address] [place], [place], [address] Phone: [phone]   Fax:  [phone]  Name: DAIZY CHARD MRN: [id number] Date of Birth: 05-21-1946

## 2016-06-17 NOTE — Patient Instructions (Signed)
Balance: Three-Way Leg Swing    Stand on left foot, hands on hips. Reach other foot forward __1__ times, sideways _1___ times, back __1__ times. Hold each position __15__ seconds. Relax. Repeat _3___ times per set. Do _1___ sets per session. Do __2__ sessions per day.  http://orth.exer.us/86  Heel Raise: Bilateral (Standing)    Rise on balls of feet. Repeat _10___ times per set. Do __1__ sets per session. Do _2___ sessions per day.  http://orth.exer.us/38   Copyright  VHI. All rights reserved.  Toe Raise (Standing)    Rock back on heels. Repeat __10__ times per set. Do _1___ sets per session. Do 2____ sessions per day.  http://orth.exer.us/42   Copyright  VHI. All rights reserved.  Functional Quadriceps: Sit to Stand    Sit on edge of chair, feet flat on floor. Stand upright, extending knees fully. Repeat __10__ times per set. Do __1__ sets per session. Do ___2_ sessions per day.  http://orth.exer.us/734   Copyright  VHI. All rights reserved.

## 2016-06-22 ENCOUNTER — Ambulatory Visit (HOSPITAL_COMMUNITY): Payer: Medicare Other

## 2016-06-22 DIAGNOSIS — M79605 Pain in left leg: Secondary | ICD-10-CM | POA: Diagnosis not present

## 2016-06-22 DIAGNOSIS — M79604 Pain in right leg: Secondary | ICD-10-CM | POA: Diagnosis not present

## 2016-06-22 DIAGNOSIS — M6281 Muscle weakness (generalized): Secondary | ICD-10-CM

## 2016-06-22 NOTE — Therapy (Signed)
Andrea [place], [place], [address] Phone: [phone]   Fax:  [phone]  Physical Therapy Treatment  Patient Details  Name: Andrea Martinez MRN: [id number] Date of Birth: 07-09-1946 Referring Provider: Dr. Ancil Linsey  Encounter Date: 06/22/2016      PT End of Session - 06/22/16 1744    Visit Number 9   Number of Visits 12   Date for PT Re-Evaluation 07/05/16   Authorization Type UHC Medicare   Authorization Time Period 05/25/2016 - 06/29/2016   Authorization - Visit Number 9   Authorization - Number of Visits 10   PT Start Time [id number]   PT Stop Time 1825   PT Time Calculation (min) 49 min   Equipment Utilized During Treatment Gait belt   Activity Tolerance Patient tolerated treatment well   Behavior During Therapy [facility] for tasks assessed/performed      Past Medical History  Diagnosis Date  . Gastric ulcer 2011    EGD, 5/11  . Hypothyroidism     not on meds, followed by Dr. Elyse Hsu  . PONV (postoperative nausea and vomiting)   . Lipoma     left upper arm  . UTI (lower urinary tract infection)     frequent  . Leukocytosis 11/20/2015  . Chronic myelogenous leukemia (CML), BCR-ABL1-positive ([place]) 11/20/2015    Past Surgical History  Procedure Laterality Date  . Esophagogastroduodenoscopy  02/2010    gastric ulcers  . Partial hysterectomy      vaginal at age 70 years of age  . Nose surgery    . Lipoma excision  08/02/2011    left shoulder  . Colonoscopy  01/2004    [facility], few small tics  . Esophagogastroduodenoscopy  12/04/2012    [id number] gastritis (inflammation) was found in the gastric antrum; multiple biopsies The duodenal mucosa showed no abnormalities in the bulb and second portion of the duodenum  . Bravo ph study  12/04/2012    Procedure: BRAVO [place];  Surgeon: [name], MD;  [facility];  Service: Endoscopy;  Laterality: N/A;  . Toe debridement  1962    lt great  toe  . Cholecystectomy N/A 01/03/2014    Procedure: LAPAROSCOPIC CHOLECYSTECTOMY WITH INTRAOPERATIVE CHOLANGIOGRAM;  Surgeon: Joyice Faster. Cornett, MD;  Location: [place];  Service: General;  Laterality: N/A;    There were no vitals filed for this visit.      Subjective Assessment - 06/22/16 1742    Subjective Pt stated her medial aspect of Rt knee pain scale 2-3/10.     Pertinent History prolapsed  bladder - no treadmill due to incontinence, arthritis in knees - pt states she needs TKA's, but not able to get them yet because of the [facility]   Patient Stated Goals Pt states she wants to be able to move around without having to stop and rest.   Currently in Pain? Yes   Pain Score 3    Pain Location Knee   Pain Orientation Right   Pain Descriptors / Indicators Nagging   Pain Type Chronic pain   Pain Radiating Towards none   Pain Onset More than a month ago   Pain Frequency Constant   Aggravating Factors  Weght bearing   Pain Relieving Factors pain medication   Effect of Pain on Daily Activities increases           [facility] Adult PT Treatment/Exercise - 06/22/16 0001    Knee/Hip Exercises: Standing  Heel Raises 15 reps   Heel Raises Limitations no hands     Forward Lunges Both;1 set;15 reps   Forward Lunges Limitations 4 inch box 1# Bil UE Flexion during lunge   Side Lunges Both;15 reps   Functional Squat 15 reps   SLS with Vectors 3x 15" Bil LE   Other Standing Knee Exercises 12in hurdles on balance beam   Other Standing Knee Exercises sidestep with GTB 2RT             Balance Exercises - 06/22/16 1830    Balance Exercises: Standing   Balance Beam tandem gait   Step Over Hurdles / [place] 12 in hurdles on balance beam             PT Short Term Goals - 06/17/16 1330    PT SHORT TERM GOAL #1   Title Pt will report consistency with HEP for strengthening to improve overal strength for functional mobility.    Baseline 06/10/2016:  Reports compliance with  HEP throughout every day   Status Achieved   PT SHORT TERM GOAL #2   Title Pt will demonstrate improvement in LE MMT by 1/2 grade to improve balance, and functional activities with decrease in pain.    Baseline 06/10/2016:  Improved 1/2 grade majority of MMT   Status Achieved   PT SHORT TERM GOAL #3   Title Pt will report 5/5 fall precautions to ensure pt understands what situations can place her at risk for falling.    Baseline 06/10/2106: Able to state fall precautions to reduce risk of fall   Status Achieved   PT SHORT TERM GOAL #4   Title Pt will demonstrate ability to perform SLS for 10 seconds on each foot to improve balance, and reduce risk for falling.    Baseline 06/10/2016:    Status Achieved           PT Long Term Goals - 06/17/16 1331    PT LONG TERM GOAL #1   Title Pt will report <2/5 pain in B knees for 70% of the time to  improve pt's QOL, and increase activity tolerance.    Baseline 06/10/2016:  Bil Rt > Lt pain scale range 3-4/10 with 1 pain medication assistance   Time 6   Period Weeks   Status On-going   PT LONG TERM GOAL #2   Title Pt will perform 5 time sit<>stand in less than 10 seconds with no UE support to demonstrate improvement with functional strength and endurance .    Baseline 06/10/2016:  5 STS no UE A 7.74"   Status Achieved   PT LONG TERM GOAL #3   Title Pt will demonstrate an increase in at least 1 MMT grade to improve her balance, endurance and decreased pain for functional activities.    Baseline 06/10/2016: ongoing   Time 6   Period Weeks   Status On-going   PT LONG TERM GOAL #4   Title Pt will demonstrate ability to perform SLS for 15 sec on each LE to demonstrate improved balance and decreased risk of falls.    Baseline 06/08/2016: Able to complete Rt 30", Lt 20", 06/10/2016: Rt 21", Lt 13"   Status Achieved               Plan - 06/22/16 1827    Clinical Impression Statement Pt progressing well with reports of increased  tolerance for functional activities and increased confidence with balance activities at home.  Progressed balance activities to dynamic  surfaces with min guard to min A with hurdles on balance beam. Main difficulty with marching on dynamic surface.  No reports of pain through session.     Rehab Potential Good   Clinical Impairments Affecting Rehab Potential Arthritis in knees   PT Frequency 2x / week   PT Duration 6 weeks   PT Treatment/Interventions ADLs/Self Care Home Management;Biofeedback;Moist Heat;Gait training;DME Instruction;Stair training;Functional mobility training;Therapeutic activities;Therapeutic exercise;Balance training;Patient/family education   PT Next Visit Plan Gcode due next session.  Have discussion with pt. for continue vs possible D/C to HEP.  Continue to progress functional strengthening and balance training on dynamic surfaces.   PT Home Exercise Plan Reviewed HEP with appropriate exercises, continue current HEP      Patient will benefit from skilled therapeutic intervention in order to improve the following deficits and impairments:  Abnormal gait, Decreased activity tolerance, Decreased balance, Decreased endurance, Decreased mobility, Decreased strength, Difficulty walking, Increased edema, Obesity, Pain  Visit Diagnosis: Muscle weakness (generalized)  Pain In Right Leg  Pain In Left Leg     Problem List Patient Active Problem List   Diagnosis Date Noted  . Chronic myelogenous leukemia (CML), BCR-ABL1-positive ([place]) 11/20/2015  . Biliary dyskinesia 12/04/2013  . Dyspepsia 11/30/2012  . Rib pain 11/01/2011  . Colon cancer screening 09/21/2011  . Lipoma 07/27/2011  . Lipoma of arm 07/27/2011  . Mass of arm 07/08/2011  . PUD 05/20/2010  . ABDOMINAL PAIN RIGHT UPPER QUADRANT 02/12/2010  . UNSPECIFIED DISORDER OF THYROID 02/11/2010  . NAUSEA 02/11/2010  . Emiliano Dyer OF 02/11/2010   Ihor Austin, LPTA; CBIS [phone]  Aldona Lento 06/22/2016, 6:34 PM  [place], [place], [address] Phone: [phone]   Fax:  [phone]  Name: LARRISHA PARVIN MRN: [id number] Date of Birth: [birth year]

## 2016-06-23 ENCOUNTER — Other Ambulatory Visit (HOSPITAL_COMMUNITY): Payer: Self-pay | Admitting: Hematology & Oncology

## 2016-06-24 ENCOUNTER — Ambulatory Visit (HOSPITAL_COMMUNITY): Payer: Medicare Other | Admitting: Physical Therapy

## 2016-06-25 ENCOUNTER — Other Ambulatory Visit (HOSPITAL_COMMUNITY): Payer: Medicare Other

## 2016-06-25 ENCOUNTER — Ambulatory Visit (HOSPITAL_COMMUNITY): Payer: Medicare Other

## 2016-06-25 VITALS — HR 76

## 2016-06-25 DIAGNOSIS — M79604 Pain in right leg: Secondary | ICD-10-CM | POA: Diagnosis not present

## 2016-06-25 DIAGNOSIS — M6281 Muscle weakness (generalized): Secondary | ICD-10-CM | POA: Diagnosis not present

## 2016-06-25 DIAGNOSIS — M79605 Pain in left leg: Secondary | ICD-10-CM

## 2016-06-25 NOTE — Patient Instructions (Addendum)
  ELASTIC BAND - HAMSTRING CURL  While seated and an elastic band attched to your ankle, bend your knee and draw back your foot.   2 x 10 reps 2x's/day.     - Hold bridge for 5-10 seconds each rep.

## 2016-06-25 NOTE — Therapy (Addendum)
[place], [place], [address] Phone: [phone]   Fax:  [phone]  Physical Therapy Treatment (D/C Summary)  Patient Details  Name: Andrea Martinez MRN: [id number] Date of Birth: [birth year] Referring Provider: Dr. Ancil Linsey  Encounter Date: 06/25/2016      PT End of Session - 06/25/16 1537    Visit Number 10   Number of Visits 12   Date for PT Re-Evaluation 07/05/16   Authorization Type [employer]   Authorization Time Period 05/25/2016 - 06/29/2016   Authorization - Visit Number 10   Authorization - Number of Visits 10   PT Start Time 1350   PT Stop Time 1436   PT Time Calculation (min) 46 min   Activity Tolerance Patient tolerated treatment well   Behavior During Therapy [facility] for tasks assessed/performed      Past Medical History  Diagnosis Date  . Gastric ulcer 2011    EGD, 5/11  . Hypothyroidism     not on meds, followed by Dr. Elyse Hsu  . PONV (postoperative nausea and vomiting)   . Lipoma     left upper arm  . UTI (lower urinary tract infection)     frequent  . Leukocytosis 11/20/2015  . Chronic myelogenous leukemia (CML), BCR-ABL1-positive ([place]) 11/20/2015    Past Surgical History  Procedure Laterality Date  . Esophagogastroduodenoscopy  02/2010    gastric ulcers  . Partial hysterectomy      vaginal at age 29 years of age  . Nose surgery    . Lipoma excision  08/02/2011    left shoulder  . Colonoscopy  01/2004    DR [facility], few small tics  . Esophagogastroduodenoscopy  12/04/2012    [id number] gastritis (inflammation) was found in the gastric antrum; multiple biopsies The duodenal mucosa showed no abnormalities in the bulb and second portion of the duodenum  . Bravo ph study  12/04/2012    Procedure: BRAVO [facility];  Surgeon: [name], MD;  [facility];  Service: Endoscopy;  Laterality: N/A;  . Toe debridement  1962    lt great toe  . Cholecystectomy N/A  01/03/2014    Procedure: LAPAROSCOPIC CHOLECYSTECTOMY WITH INTRAOPERATIVE CHOLANGIOGRAM;  Surgeon: Joyice Faster. Cornett, MD;  Location: [place];  Service: General;  Laterality: N/A;    Filed Vitals:   06/25/16 1514  Pulse: 76  SpO2: 98%        Subjective Assessment - 06/25/16 1515    Subjective Pt expressed that she feels great!  She feels that her balance has really improved, and she has gained a lot of confidence.    Pertinent History prolapsed  bladder - no treadmill due to incontinence, arthritis in knees - pt states she needs TKA's, but not able to get them yet because of the CML   How long can you sit comfortably? unlimited   How long can you stand comfortably? several hours without problem, does complain in soreness in knees (was 30-45 minutes)   How long can you walk comfortably? Able to walk 30 minutes before shortness of breath   Patient Stated Goals Pt states she wants to be able to move around without having to stop and rest.   Currently in Pain? No/denies            [facility] PT Assessment - 06/25/16 0001    Assessment   Medical Diagnosis CML   Referring Provider Dr. Ancil Linsey   Onset Date/Surgical  Date 05/25/16   Strength   Right Hip Flexion 3+/5   Left Hip Flexion 3+/5   Right Knee Flexion 4/5   Right Knee Extension 5/5   Left Knee Flexion 4/5   Left Knee Extension 5/5   Right Ankle Dorsiflexion 5/5   Left Ankle Dorsiflexion 5/5   Transfers   Transfers Sit to Stand   Sit to Stand 7: Independent   Five time sit to stand comments  mat with arms crossed over chest in 8.88seconds.    6 Minute Walk- Baseline   HR (bpm) 76  SpO2 98% after 3MWT   Berg Balance Test   Sit to Stand Able to stand without using hands and stabilize independently   Standing Unsupported Able to stand safely 2 minutes   Sitting with Back Unsupported but Feet Supported on Floor or Stool Able to sit safely and securely 2 minutes   Stand to Sit Sits safely with minimal  use of hands   Transfers Able to transfer safely, minor use of hands   Standing Unsupported with Eyes Closed Able to stand 10 seconds safely   Standing Ubsupported with Feet Together Able to place feet together independently and stand 1 minute safely   From Standing, Reach Forward with Outstretched Arm Can reach confidently >25 cm (10")   From Standing Position, Pick up Object from Floor Able to pick up shoe safely and easily   From Standing Position, Turn to Look Behind Over each Shoulder Looks behind from both sides and weight shifts well   Turn 360 Degrees Able to turn 360 degrees safely in 4 seconds or less   Standing Unsupported, Alternately Place Feet on Step/Stool Able to stand independently and safely and complete 8 steps in 20 seconds   Standing Unsupported, One Foot in Front Able to place foot tandem independently and hold 30 seconds   Standing on One Leg Able to lift leg independently and hold > 10 seconds   Total Score 56                     [facility] Adult PT Treatment/Exercise - 06/25/16 0001    Ambulation/Gait   Ambulation/Gait Yes   Ambulation/Gait Assistance 7: Independent   Ambulation Distance (Feet) 848 Feet   Assistive device None   Gait Pattern Step-through pattern   Ambulation Surface Level   Knee/Hip Exercises: Seated   Hamstring Curl Strengthening;Both;2 sets;10 reps  red TB   Knee/Hip Exercises: Supine   Bridges Strengthening;2 sets;10 reps  hold for 5 seconds   Knee/Hip Exercises: Prone   Hip Extension Strengthening;Both;10 reps   Hip Extension Limitations cueing for form'             [facility] PT Assessment - 06/25/16 0001    Assessment   Medical Diagnosis CML   Referring Provider Dr. Ancil Linsey   Onset Date/Surgical Date 05/25/16   Strength   Right Hip Flexion 3+/5   Left Hip Flexion 3+/5   Right Knee Flexion 4/5   Right Knee Extension 5/5   Left Knee Flexion 4/5   Left Knee Extension 5/5   Right Ankle Dorsiflexion 5/5   Left  Ankle Dorsiflexion 5/5   Transfers   Transfers Sit to Stand   Sit to Stand 7: Independent   Five time sit to stand comments  mat with arms crossed over chest in 8.88seconds.    6 Minute Walk- Baseline   HR (bpm) 76  SpO2 98% after [employer]  Sit to Stand Able to stand without using hands and stabilize independently   Standing Unsupported Able to stand safely 2 minutes   Sitting with Back Unsupported but Feet Supported on Floor or Stool Able to sit safely and securely 2 minutes   Stand to Sit Sits safely with minimal use of hands   Transfers Able to transfer safely, minor use of hands   Standing Unsupported with Eyes Closed Able to stand 10 seconds safely   Standing Ubsupported with Feet Together Able to place feet together independently and stand 1 minute safely   From Standing, Reach Forward with Outstretched Arm Can reach confidently >25 cm (10")   From Standing Position, Pick up Object from Floor Able to pick up shoe safely and easily   From Standing Position, Turn to Look Behind Over each Shoulder Looks behind from both sides and weight shifts well   Turn 360 Degrees Able to turn 360 degrees safely in 4 seconds or less   Standing Unsupported, Alternately Place Feet on Step/Stool Able to stand independently and safely and complete 8 steps in 20 seconds   Standing Unsupported, One Foot in Front Able to place foot tandem independently and hold 30 seconds   Standing on One Leg Able to lift leg independently and hold > 10 seconds   Total Score 56              PT Education - 06/25/16 1523    Education provided Yes   Education Details HEP   Person(s) Educated Patient   Methods Explanation;Demonstration;Tactile cues;Handout   Comprehension Verbalized understanding          PT Short Term Goals - 06/25/16 1412    PT SHORT TERM GOAL #1   Title Pt will report consistency with HEP for strengthening to improve overal strength for functional mobility.    Baseline  06/10/2016:  Reports compliance with HEP throughout every day   Time 3   Status Achieved   PT SHORT TERM GOAL #2   Title Pt will demonstrate improvement in LE MMT by 1/2 grade to improve balance, and functional activities with decrease in pain.    Baseline 06/10/2016:  Improved 1/2 grade majority of MMT   Time 3   Period Weeks   Status Achieved   PT SHORT TERM GOAL #3   Title Pt will report 5/5 fall precautions to ensure pt understands what situations can place her at risk for falling.    Baseline 06/10/2106: Able to state fall precautions to reduce risk of fall   Time 3   Period Weeks   Status Achieved   PT SHORT TERM GOAL #4   Title Pt will demonstrate ability to perform SLS for 10 seconds on each foot to improve balance, and reduce risk for falling.    Baseline 06/10/2016:    Time 3   Status Achieved           PT Long Term Goals - 06/25/16 1413    PT LONG TERM GOAL #1   Title Pt will report <2/5 pain in B knees for 70% of the time to  improve pt's QOL, and increase activity tolerance.    Baseline 06/10/2016:  Bil Rt > Lt pain scale range 3-4/10 with 1 pain medication assistance.  06/25/2016 - met   Time 6   Period Weeks   Status Achieved   PT LONG TERM GOAL #2   Title Pt will perform 5 time sit<>stand in less than 10 seconds with no UE  support to demonstrate improvement with functional strength and endurance .    Baseline 06/10/2016:  5 STS no UE A 7.74"    Time 6   Period Weeks   Status Achieved   PT LONG TERM GOAL #3   Title Pt will demonstrate an increase in at least 1 MMT grade to improve her balance, endurance and decreased pain for functional activities.    Baseline 06/10/2016: ongoing   Time 6   Period Weeks   Status Partially Met   PT LONG TERM GOAL #4   Title Pt will demonstrate ability to perform SLS for 15 sec on each LE to demonstrate improved balance and decreased risk of falls.    Baseline 06/08/2016: Able to complete Rt 30", Lt 20", 06/10/2016: Rt 21",  Lt 13"   Time 6   Period Weeks   Status Achieved               Plan - July 16, 2016 1538    Clinical Impression Statement Pt has demonstrated a great amount of improvement with regards to her balance.  She exudes confidence with her mobility, and has been able to significantly improve her gait speed during PT sessions.  She has improved with her strength in some areas, while others remain the same.  She has been very compliant with HEP and education that has been given to her.  At this point she is independent with good technique with her HEP, and has met nearly all of her goals.  Therefore, she is being recommended for d/c from skilled OPPT and encouraged to continue with HEP as well as walking program at home.     Rehab Potential Good   Clinical Impairments Affecting Rehab Potential Arthritis in knees   PT Home Exercise Plan Added seated HS curl with red theraband.  Also increased to 5 second hold with bridge.    Consulted and Agree with Plan of Care Patient     PHYSICAL THERAPY DISCHARGE SUMMARY  Visits from Start of Care: 10 out of 12  Current functional level related to goals / functional outcomes: Pt has met all goals in regards to balance.  She demonstrated excellent compliance with HEP.  During PT she was able to demonstrate improvement in gait speed which is indicator of decreased morbidity.  Finally she reports that she is feeling better enough to the point where she feels confident going [place]        Remaining deficits: Pt continues to have some LE weakness noted mostly in the hamstrings, as well as the hip abductors and extensors.  HEP given to continue to work on and independently.     Education / Equipment: Updated HEP, and given red theraband to complete exercises.   Plan: Patient agrees to discharge.  Patient goals were partially met. Patient is being discharged due to being pleased with the current functional level.  ?????         Patient will benefit from skilled therapeutic intervention in order to improve the following deficits and impairments:     Visit Diagnosis: Muscle weakness (generalized)  Pain In Right Leg  Pain In Left Leg       G-Codes - 2016/07/16 1615    Functional Assessment Tool Used Clinical Assessment via strength testing and functional testing.    Functional Limitation Mobility: Walking and moving around   Mobility: Walking and Moving Around Current Status [phone]) At least 1 percent but less than 20 percent impaired, limited  or restricted   Mobility: Walking and Moving Around Goal Status [phone]) At least 1 percent but less than 20 percent impaired, limited or restricted   Mobility: Walking and Moving Around Discharge Status [phone]) At least 1 percent but less than 20 percent impaired, limited or restricted      Problem List Patient Active Problem List   Diagnosis Date Noted  . Chronic myelogenous leukemia (CML), BCR-ABL1-positive ([place]) 11/20/2015  . Biliary dyskinesia 12/04/2013  . Dyspepsia 11/30/2012  . Rib pain 11/01/2011  . Colon cancer screening 09/21/2011  . Lipoma 07/27/2011  . Lipoma of arm 07/27/2011  . Mass of arm 07/08/2011  . PUD 05/20/2010  . ABDOMINAL PAIN RIGHT UPPER QUADRANT 02/12/2010  . UNSPECIFIED DISORDER OF THYROID 02/11/2010  . NAUSEA 02/11/2010  . Emiliano Dyer OF 02/11/2010   Beth Lilleigh Hechavarria, PT, DPT X: [phone]   [facility] [address] [place], [place], [address] Phone: [phone]   Fax:  [phone]  Name: Andrea Martinez MRN: [id number] Date of Birth: 1946-08-05

## 2016-06-29 ENCOUNTER — Ambulatory Visit (HOSPITAL_COMMUNITY): Payer: Medicare Other | Admitting: Physical Therapy

## 2016-07-01 ENCOUNTER — Ambulatory Visit (HOSPITAL_COMMUNITY): Payer: Medicare Other | Admitting: Physical Therapy

## 2016-07-05 ENCOUNTER — Ambulatory Visit (HOSPITAL_COMMUNITY): Payer: Medicare Other | Admitting: Physical Therapy

## 2016-07-05 ENCOUNTER — Encounter (HOSPITAL_COMMUNITY): Payer: Self-pay | Admitting: Hematology & Oncology

## 2016-07-06 ENCOUNTER — Ambulatory Visit (HOSPITAL_COMMUNITY): Payer: Medicare Other | Admitting: Physical Therapy

## 2016-07-08 ENCOUNTER — Ambulatory Visit (HOSPITAL_COMMUNITY): Payer: Medicare Other

## 2016-07-23 ENCOUNTER — Encounter (HOSPITAL_COMMUNITY): Payer: Medicare Other | Attending: Hematology & Oncology

## 2016-07-23 DIAGNOSIS — D649 Anemia, unspecified: Secondary | ICD-10-CM | POA: Diagnosis not present

## 2016-07-23 DIAGNOSIS — C9210 Chronic myeloid leukemia, BCR/ABL-positive, not having achieved remission: Secondary | ICD-10-CM | POA: Insufficient documentation

## 2016-07-23 LAB — CBC WITH DIFFERENTIAL/PLATELET
BASOS PCT: 0 %
Basophils Absolute: 0 10*3/uL (ref 0.0–0.1)
EOS ABS: 0.2 10*3/uL (ref 0.0–0.7)
Eosinophils Relative: 2 %
HCT: 37.1 % (ref 36.0–46.0)
HEMOGLOBIN: 11.8 g/dL — AB (ref 12.0–15.0)
Lymphocytes Relative: 13 %
Lymphs Abs: 1.2 10*3/uL (ref 0.7–4.0)
MCH: 27.3 pg (ref 26.0–34.0)
MCHC: 31.8 g/dL (ref 30.0–36.0)
MCV: 85.9 fL (ref 78.0–100.0)
Monocytes Absolute: 0.6 10*3/uL (ref 0.1–1.0)
Monocytes Relative: 6 %
NEUTROS PCT: 79 %
Neutro Abs: 6.8 10*3/uL (ref 1.7–7.7)
Platelets: 256 10*3/uL (ref 150–400)
RBC: 4.32 MIL/uL (ref 3.87–5.11)
RDW: 16.4 % — ABNORMAL HIGH (ref 11.5–15.5)
WBC: 8.7 10*3/uL (ref 4.0–10.5)

## 2016-07-23 LAB — IRON AND TIBC
IRON: 34 ug/dL (ref 28–170)
Saturation Ratios: 9 % — ABNORMAL LOW (ref 10.4–31.8)
TIBC: 361 ug/dL (ref 250–450)
UIBC: 327 ug/dL

## 2016-07-23 LAB — COMPREHENSIVE METABOLIC PANEL
ALBUMIN: 4.1 g/dL (ref 3.5–5.0)
ALK PHOS: 161 U/L — AB (ref 38–126)
ALT: 24 U/L (ref 14–54)
AST: 35 U/L (ref 15–41)
Anion gap: 7 (ref 5–15)
BUN: 22 mg/dL — ABNORMAL HIGH (ref 6–20)
CALCIUM: 8.7 mg/dL — AB (ref 8.9–10.3)
CO2: 27 mmol/L (ref 22–32)
CREATININE: 0.9 mg/dL (ref 0.44–1.00)
Chloride: 104 mmol/L (ref 101–111)
GFR calc Af Amer: >60 mL/min (ref >60)
GFR calc non Af Amer: >60 mL/min (ref >60)
GLUCOSE: 108 mg/dL — AB (ref 65–99)
Potassium: 4.1 mmol/L (ref 3.5–5.1)
SODIUM: 138 mmol/L (ref 135–145)
Total Bilirubin: 1.2 mg/dL (ref 0.3–1.2)
Total Protein: 7.6 g/dL (ref 6.5–8.1)

## 2016-07-23 LAB — FERRITIN: Ferritin: 59 ng/mL (ref 11–307)

## 2016-07-23 LAB — PHOSPHORUS: Phosphorus: 3 mg/dL (ref 2.5–4.6)

## 2016-07-30 LAB — BCR-ABL1, CML/ALL, PCR, QUANT: B2A2 TRANSCRIPT: 0.348 %

## 2016-08-03 ENCOUNTER — Telehealth (HOSPITAL_COMMUNITY): Payer: Self-pay | Admitting: Oncology

## 2016-08-03 NOTE — Telephone Encounter (Signed)
BCR ABL results discussed with Dr. Florene Glen.  Her BCR ABL has plateaued most recently and given its level, this is too high for comfort.  We experienced a difficult time getting her medication that was affordable.  She is currently on Tasigna.    After discussing the case, we have opted to continue treatment as planned and repeat BCR/ABL in 3 months.  If no further improvement in BCR/ABL is noted in 3 months, then we will need to consider changing therapy.  Hematologically, she has experienced a good response with normalization of WBC and platelet count with improving anemia that is nearly normal at 11.8 g/dL most recently.  Robynn Pane, PA-C 08/03/2016 2:03 PM

## 2016-08-20 ENCOUNTER — Encounter (HOSPITAL_COMMUNITY): Payer: Medicare Other | Attending: Hematology & Oncology | Admitting: Hematology & Oncology

## 2016-08-20 ENCOUNTER — Ambulatory Visit (HOSPITAL_COMMUNITY)
Admission: RE | Admit: 2016-08-20 | Discharge: 2016-08-20 | Disposition: A | Discharge status: Home / Self Care | Payer: Medicare Other | Source: Ambulatory Visit | Attending: Hematology & Oncology | Admitting: Hematology & Oncology

## 2016-08-20 ENCOUNTER — Encounter (HOSPITAL_COMMUNITY): Payer: Self-pay | Admitting: Hematology & Oncology

## 2016-08-20 ENCOUNTER — Encounter (HOSPITAL_COMMUNITY): Payer: Medicare Other

## 2016-08-20 VITALS — BP 142/64 | HR 72 | Temp 98.0°F | Resp 18

## 2016-08-20 DIAGNOSIS — C9210 Chronic myeloid leukemia, BCR/ABL-positive, not having achieved remission: Secondary | ICD-10-CM | POA: Diagnosis not present

## 2016-08-20 DIAGNOSIS — R110 Nausea: Secondary | ICD-10-CM | POA: Insufficient documentation

## 2016-08-20 DIAGNOSIS — D649 Anemia, unspecified: Secondary | ICD-10-CM | POA: Insufficient documentation

## 2016-08-20 DIAGNOSIS — E039 Hypothyroidism, unspecified: Secondary | ICD-10-CM | POA: Insufficient documentation

## 2016-08-20 DIAGNOSIS — M79645 Pain in left finger(s): Secondary | ICD-10-CM | POA: Insufficient documentation

## 2016-08-20 DIAGNOSIS — F4320 Adjustment disorder, unspecified: Secondary | ICD-10-CM | POA: Insufficient documentation

## 2016-08-20 DIAGNOSIS — Z888 Allergy status to other drugs, medicaments and biological substances status: Secondary | ICD-10-CM | POA: Insufficient documentation

## 2016-08-20 DIAGNOSIS — R21 Rash and other nonspecific skin eruption: Secondary | ICD-10-CM | POA: Diagnosis not present

## 2016-08-20 DIAGNOSIS — Z9889 Other specified postprocedural states: Secondary | ICD-10-CM | POA: Insufficient documentation

## 2016-08-20 DIAGNOSIS — K589 Irritable bowel syndrome without diarrhea: Secondary | ICD-10-CM | POA: Insufficient documentation

## 2016-08-20 DIAGNOSIS — Z79899 Other long term (current) drug therapy: Secondary | ICD-10-CM | POA: Insufficient documentation

## 2016-08-20 DIAGNOSIS — Z8719 Personal history of other diseases of the digestive system: Secondary | ICD-10-CM | POA: Diagnosis not present

## 2016-08-20 DIAGNOSIS — Z9049 Acquired absence of other specified parts of digestive tract: Secondary | ICD-10-CM | POA: Insufficient documentation

## 2016-08-20 DIAGNOSIS — M7989 Other specified soft tissue disorders: Secondary | ICD-10-CM | POA: Diagnosis not present

## 2016-08-20 DIAGNOSIS — Z90710 Acquired absence of both cervix and uterus: Secondary | ICD-10-CM | POA: Insufficient documentation

## 2016-08-20 DIAGNOSIS — F418 Other specified anxiety disorders: Secondary | ICD-10-CM

## 2016-08-20 LAB — COMPREHENSIVE METABOLIC PANEL
ALBUMIN: 4.2 g/dL (ref 3.5–5.0)
ALT: 25 U/L (ref 14–54)
ANION GAP: 9 (ref 5–15)
AST: 31 U/L (ref 15–41)
Alkaline Phosphatase: 165 U/L — ABNORMAL HIGH (ref 38–126)
BUN: 22 mg/dL — AB (ref 6–20)
CHLORIDE: 102 mmol/L (ref 101–111)
CO2: 28 mmol/L (ref 22–32)
Calcium: 9.3 mg/dL (ref 8.9–10.3)
Creatinine, Ser: 0.92 mg/dL (ref 0.44–1.00)
GFR calc Af Amer: >60 mL/min (ref >60)
GFR calc non Af Amer: >60 mL/min (ref >60)
GLUCOSE: 117 mg/dL — AB (ref 65–99)
POTASSIUM: 4 mmol/L (ref 3.5–5.1)
SODIUM: 139 mmol/L (ref 135–145)
TOTAL PROTEIN: 7.6 g/dL (ref 6.5–8.1)
Total Bilirubin: 1.2 mg/dL (ref 0.3–1.2)

## 2016-08-20 LAB — CBC WITH DIFFERENTIAL/PLATELET
BASOS ABS: 0 10*3/uL (ref 0.0–0.1)
BASOS PCT: 0 %
EOS ABS: 0.2 10*3/uL (ref 0.0–0.7)
EOS PCT: 2 %
HCT: 35.9 % — ABNORMAL LOW (ref 36.0–46.0)
Hemoglobin: 11.4 g/dL — ABNORMAL LOW (ref 12.0–15.0)
Lymphocytes Relative: 16 %
Lymphs Abs: 1.5 10*3/uL (ref 0.7–4.0)
MCH: 28 pg (ref 26.0–34.0)
MCHC: 31.8 g/dL (ref 30.0–36.0)
MCV: 88.2 fL (ref 78.0–100.0)
MONO ABS: 0.6 10*3/uL (ref 0.1–1.0)
MONOS PCT: 6 %
NEUTROS ABS: 7.4 10*3/uL (ref 1.7–7.7)
Neutrophils Relative %: 76 %
PLATELETS: 265 10*3/uL (ref 150–400)
RBC: 4.07 MIL/uL (ref 3.87–5.11)
RDW: 16.2 % — AB (ref 11.5–15.5)
WBC: 9.7 10*3/uL (ref 4.0–10.5)

## 2016-08-20 NOTE — Progress Notes (Signed)
[place] at [facility] Hematology/Oncology Progress Note  Name: Andrea Martinez      MRN: [id number]   Date: 08/21/2016 Time:7:20 PM   REFERRING PHYSICIAN:  Sinda Du, MD   DIAGNOSIS:  CML   HISTORY OF PRESENT ILLNESS:   Andrea Martinez is a 70 year old white American female here for additional f/u of CML.  Andrea Martinez is accompanied by her husband.  She continues to feel fatigued. She has been going out to eat, not every time but more often. She feels the fatigue is more a problem than her strength. Her husband attributes her fatigue to taking her medication so late then not going to bed until 11 pm or 12 pm.  She enjoys her support group and gets a lot out of it.   She does not like to take the anti-nausea pills. She still has them in case she needs them but prefers to have ginger ale with a lemon drop and crackers, instead.  She continues to have a rash, which is mild. She also has warts which have improved. Neither hurt, "they are just annoying".  The thing that is bothering her the most in her left thumb. The joints hurt and began hurting 2 weeks ago. She has not injured it or fallen. She has not lifted anything either.   Occasionally her stomach feels sore when she goes to the bathroom, but nothing to be concerned about. Her IBS is well managed and she only moves her bowels once or twice daily.  She completed her physical therapy and was finished after 10 sessions. She really feel that this helped her.   She takes her medication at the same time everyday and has not had any issues getting it. Her husband notes that she is compliant almost to a fault. She takes it exactly as prescribed.    PAST MEDICAL HISTORY:   Past Medical History:  Diagnosis Date  . Chronic myelogenous leukemia (CML), BCR-ABL1-positive ([place]) 11/20/2015  . Gastric ulcer 2011   EGD, 5/11  . Hypothyroidism    not on meds, followed by Dr. Elyse Hsu  . Leukocytosis 11/20/2015  .  Lipoma    left upper arm  . PONV (postoperative nausea and vomiting)   . UTI (lower urinary tract infection)    frequent    ALLERGIES: Allergies  Allergen Reactions  . Codeine Nausea Only    Headache  . Esomeprazole Magnesium Nausea Only  . Sulfonamide Derivatives Nausea Only    Headache  . Fluconazole Rash    Redness and blistering on left thigh      MEDICATIONS: I have reviewed the patient's current medications.    Current Outpatient Prescriptions on File Prior to Visit  Medication Sig Dispense Refill  . acetaminophen (TYLENOL) 500 MG tablet Take 500 mg by mouth every 6 (six) hours as needed.    . cetirizine (ZYRTEC) 10 MG tablet Take 10 mg by mouth as needed for allergies.    . Cholecalciferol (VITAMIN D-3) 5000 UNITS TABS Take 5,000 Units by mouth daily.      Marland Kitchen conjugated estrogens (PREMARIN) vaginal cream Place 1 Applicatorful vaginally 2 (two) times a week. 1/2 to 1g    . escitalopram (LEXAPRO) 20 MG tablet TAKE 1 TABLET (20 MG TOTAL) BY MOUTH DAILY. 30 tablet 2  . Multiple Vitamins-Minerals (CENTRUM SILVER PO) Take 1 capsule by mouth daily.    . nilotinib (TASIGNA) 150 MG capsule Take 2 capsules (300 mg  total) by mouth every 12 (twelve) hours. 120 capsule 11  . Probiotic Product (DIGESTIVE ADVANTAGE GUMMIES PO) Take 2 tablets by mouth daily.    . traMADol (ULTRAM) 50 MG tablet Take 1 tablet (50 mg total) by mouth every 6 (six) hours as needed. 90 tablet 0  . triamcinolone cream (KENALOG) 0.1 % Apply 1 application topically 2 (two) times daily. 453.6 g 0  . vitamin B-12 (CYANOCOBALAMIN) 1000 MCG tablet Take 5,000 mcg by mouth daily.     Marland Kitchen ALPRAZolam (XANAX) 0.25 MG tablet Take 1 tablet (0.25 mg total) by mouth every 6 (six) hours as needed for anxiety. (Patient not taking: Reported on 06/11/2016) 30 tablet 1  . benzonatate (TESSALON) 200 MG capsule Take 1 capsule (200 mg total) by mouth 3 (three) times daily as needed for cough. (Patient not taking: Reported on 06/11/2016) 45  capsule 2  . Cholestyramine Light (PREVALITE PO) Take 4 g by mouth as needed. Reported on 06/11/2016    . ondansetron (ZOFRAN) 8 MG tablet Take 1 tablet (8 mg total) by mouth every 8 (eight) hours as needed for nausea or vomiting. (Patient not taking: Reported on 06/11/2016) 30 tablet 2  . prochlorperazine (COMPAZINE) 10 MG tablet Take 1 tablet (10 mg total) by mouth every 6 (six) hours as needed for nausea or vomiting. (Patient not taking: Reported on 08/20/2016) 60 tablet 2  . promethazine (PHENERGAN) 25 MG tablet Take 1 tablet (25 mg total) by mouth every 6 (six) hours as needed for nausea or vomiting. (Patient not taking: Reported on 08/20/2016) 60 tablet 2   No current facility-administered medications on file prior to visit.      PAST SURGICAL HISTORY Past Surgical History:  Procedure Laterality Date  . BRAVO [place] STUDY  12/04/2012   Procedure: BRAVO [place];  Surgeon: [name], MD;  [facility];  Service: Endoscopy;  Laterality: N/A;  . CHOLECYSTECTOMY N/A 01/03/2014   Procedure: LAPAROSCOPIC CHOLECYSTECTOMY WITH INTRAOPERATIVE CHOLANGIOGRAM;  Surgeon: Joyice Faster. Cornett, MD;  Location: [place];  Service: General;  Laterality: N/A;  . COLONOSCOPY  01/2004   DR [facility], few small tics  . ESOPHAGOGASTRODUODENOSCOPY  02/2010   gastric ulcers  . ESOPHAGOGASTRODUODENOSCOPY  12/04/2012   VQX:IHWTUUE gastritis (inflammation) was found in the gastric antrum; multiple biopsies The duodenal mucosa showed no abnormalities in the bulb and second portion of the duodenum  . LIPOMA EXCISION  08/02/2011   left shoulder  . NOSE SURGERY    . PARTIAL HYSTERECTOMY     vaginal at age 70 years of age  . TOE DEBRIDEMENT  1962   lt great toe    FAMILY HISTORY: Family History  Problem Relation Age of Onset  . Lung cancer Father   . COPD Mother   . Colon cancer Neg Hx   . Anesthesia problems Neg Hx   . Hypotension Neg Hx   . Malignant hyperthermia Neg Hx   . Pseudochol  deficiency Neg Hx     SOCIAL HISTORY:  reports that she has never smoked. She has never used smokeless tobacco. She reports that she does not drink alcohol or use drugs.  PERFORMANCE STATUS: The patient's performance status is 1 - Symptomatic but completely ambulatory  REVIEW OF SYSTEMS: Positive for malaise/fatigue. Positive for anxiety, improved. Positive for, joint pain, and knee pain. Left thumb joint pain for the last two weeks. Positive for rash. Positive for nocturia  She drinks 8 oz water before she goes to sleep while  taking her chemotherapy pill.  14 point review of systems was performed and is negative except as detailed under history of present illness and above   PHYSICAL EXAM: Most Recent Vital Signs: Blood pressure (!) 142/64, pulse 72, temperature 98 F (36.7 C), temperature source Oral, resp. rate 18, SpO2 96 %. General appearance: alert, cooperative, appears stated age, fatigued, no distress, moderately obese , anxious, and accompanied by her husband Head: Normocephalic, without obvious abnormality, atraumatic Eyes: negative findings: lids and lashes normal, conjunctivae and sclerae normal, corneas clear and pupils equal, round, reactive to light and accomodation Throat: lips, mucosa, and tongue normal; teeth and gums normal Neck: no adenopathy and supple, symmetrical, trachea midline Lungs: clear to auscultation bilaterally and normal percussion bilaterally Heart: regular rate and rhythm, S1, S2 normal, no murmur, click, rub or gallop Abdomen: normal findings: bowel sounds normal, liver span normal to percussion, no bruits heard, no masses palpable, no organomegaly, no renal abnormalities palpable, spleen non-palpable and symmetric and abnormal findings:  mild tenderness in the lower abdomen Extremities: Homans sign is negative, no sign of DVT and no edema, redness or tenderness in the calves or thighs, L thumb is mildly swollen, limited ROM secondary to pain, no  erythema or warmth Skin: Skin color, texture, turgor normal.  Lymph nodes: Cervical, supraclavicular, and axillary nodes normal. Neurologic: Grossly normal   LABORATORY DATA:  I have reviewed the data as listed.   CBC    Component Value Date/Time   WBC 9.7 08/20/2016 0942   RBC 4.07 08/20/2016 0942   HGB 11.4 (L) 08/20/2016 0942   HCT 35.9 (L) 08/20/2016 0942   PLT 265 08/20/2016 0942   MCV 88.2 08/20/2016 0942   MCH 28.0 08/20/2016 0942   MCHC 31.8 08/20/2016 0942   RDW 16.2 (H) 08/20/2016 0942   LYMPHSABS 1.5 08/20/2016 0942   MONOABS 0.6 08/20/2016 0942   EOSABS 0.2 08/20/2016 0942   BASOSABS 0.0 08/20/2016 0942   CMP     Component Value Date/Time   NA 139 08/20/2016 0942   K 4.0 08/20/2016 0942   CL 102 08/20/2016 0942   CO2 28 08/20/2016 0942   GLUCOSE 117 (H) 08/20/2016 0942   BUN 22 (H) 08/20/2016 0942   CREATININE 0.92 08/20/2016 0942   CREATININE 0.87 07/14/2011 0938   CALCIUM 9.3 08/20/2016 0942   PROT 7.6 08/20/2016 0942   ALBUMIN 4.2 08/20/2016 0942   AST 31 08/20/2016 0942   ALT 25 08/20/2016 0942   ALKPHOS 165 (H) 08/20/2016 0942   BILITOT 1.2 08/20/2016 0942   GFRNONAA >60 08/20/2016 0942   GFRAA >60 08/20/2016 0942    PATHOLOGY:        ASSESSMENT/PLAN:  CML Anemia Tasigna 01/06/2016   She is to continue on tasigna as prescribed. She has not achieved a MMR at 8 months, b2a2 transcript is still at 0.3%, essentially stable. This was discussed with Dr. Florene Glen. We will continue for now and see where her BCR-ABL is on recheck in November. Will consider repeating BMBX at that point pending results and /or referral back to [facility].   Depression/Adjustment Disorder  She is to continue on her lexapro. Continue with support group.   Nausea  I have written her for phenergan and compazine to try as needed for her nausea. She rarely uses these, she does get intermittent nausea but prefers to use ginger ale, lemon drops etc.   EKG change  She has  seen cardiology.  RASH  Triamcinolone cream.  Most likely from  TKI therapy and mild.  L thumb swelling and pain  Will order plain films today. She will be apprised of the results.   Monitoring Response to TKI therapy  Bone marrow cytogenetics: at diagnosis, failure to reach response milestones and any sign of loss of response (defined as hematologic or cytogenetic relapse)  Quantitative RT-PCR using is preferred: at diagnosis, every 3 months after initiating treatment, after BCR-ABL1 0.1% - , 1% (IS) has been achieved, every 3 months for 2 years and every 3 - 6 months thereafter. If there is 1 log increase in BCR-ABL1 transcript levels with MMR, QPCR should be repeated in 1 - 3 months  BCR-ABL kinase domain mutation analysis: Chronic phase: failure to reach response milestones, any sign of loss of response (defined as hematologic or cytogenetic relapse) 1 - log increase in BCR-ABL1 transcript levels and loss of MMR. Disease progression to accelerated or blast phase  Criteria for Hematologic and molecular response   Complete Hematologic response Complete normalization of peripheral blood counts with leukocyte count ,10x103/L Platelet count < 450 x 109/L NO immature cells such as myelocytes, promyelocytes or blasts in peripheral blood No signs and symptoms of disease with disappearance of palpable splenomegaly  Molecular response Early molecular response  BCR-ABL1<= 10% (IS) at 3 and 6 months Major molecular response BCR-ABL1,0.1%  (IS) or > = 3 log reduction in BCR-ABL31mNA from the standardized baseline  Complete molecular response no detectable BCR-ABL111mA using a QPCR assay with a sensitivity of at least 4.5 logs below the standardized baseline  She will return for blood work in 6 weeks and follow up in 3 months.  //  I spoke with the patient again about changing her medication schedule.  She does not need any refills at this time.   I have ordered an X-ray of her left thumb  to investigate pain.  BCR-ABL in November. RTC 3 months.    Orders Placed This Encounter  Procedures  . DG Finger Thumb Left    Standing Status:   Future    Number of Occurrences:   1    Standing Expiration Date:   10/20/2017    Order Specific Question:   Reason for Exam (SYMPTOM  OR DIAGNOSIS REQUIRED)    Answer:   pain,swelling    Order Specific Question:   Preferred imaging location?    [facility]. CBC with Differential    Standing Status:   Future    Standing Expiration Date:   08/20/2017  . Comprehensive metabolic panel    Standing Status:   Future    Standing Expiration Date:   08/20/2017  . BCR-ABL1, CML/ALL, PCR, QUANT    Standing Status:   Future    Standing Expiration Date:   08/20/2017  . Phosphorus    Standing Status:   Future    Standing Expiration Date:   08/20/2017     All questions were answered. The patient knows to call the clinic with any problems, questions or concerns. We can certainly see the patient much sooner if necessary.  This document serves as a record of services personally performed by ShAncil LinseyMD. It was created on her behalf by ElArlyce Harmana trained medical scribe. The creation of this record is based on the scribe's personal observations and the provider's statements to them. This document has been checked and approved by the attending provider.  I have reviewed the above documentation for accuracy and completeness, and I agree with the  above.  This note is electronically signed by:  Molli Hazard, MD  08/21/2016 7:20 PM

## 2016-08-20 NOTE — Patient Instructions (Signed)
[place] at [facility] Discharge Instructions  RECOMMENDATIONS MADE BY THE CONSULTANT AND ANY TEST RESULTS WILL BE SENT TO YOUR REFERRING PHYSICIAN.  You saw Dr.Penland today. Labs in November. Follow up in 3 months with labs.  Thank you for choosing [place] at [facility] to provide your oncology and hematology care.  To afford each patient quality time with our provider, please arrive at least 15 minutes before your scheduled appointment time.   Beginning January 23rd 2017 lab work for the [facility] will be done in the  Main lab at [facility] on 1st floor. If you have a lab appointment with the [facility] please come in thru the  Main Entrance and check in at the main information desk  You need to re-schedule your appointment should you arrive 10 or more minutes late.  We strive to give you quality time with our providers, and arriving late affects you and other patients whose appointments are after yours.  Also, if you no show three or more times for appointments you may be dismissed from the clinic at the providers discretion.     Again, thank you for choosing [facility].  Our hope is that these requests will decrease the amount of time that you wait before being seen by our physicians.       _____________________________________________________________  Should you have questions after your visit to [facility], please contact our office at [phone] between the hours of 8:30 a.m. and 4:30 p.m.  Voicemails left after 4:30 p.m. will not be returned until the following business day.  For prescription refill requests, have your pharmacy contact our office.         Resources For Cancer Patients and their Caregivers ? American Cancer Society: Can assist with transportation, wigs, general needs, runs Look Good Feel Better.        979-808-9293 ? Cancer Care: Provides financial assistance, online  support groups, medication/co-pay assistance.  1-800-813-HOPE 772-124-9900) ? [place] Assists [place] cancer patients and their families through emotional , educational and financial support.  (778) 662-4407 ? [place] DSS Where to apply for food stamps, Medicaid and utility assistance. (762) 536-5154 ? [facility]: Transportation to medical appointments. 775-539-3836 ? Social Security Administration: May apply for disability if have a Stage IV cancer. (364)557-5982 7042917153 ? [employer], Disability and Transit Services: Assists with nutrition, care and transit needs. [place] Support Programs: @10RELATIVEDAYS @ > Cancer Support Group  2nd Tuesday of the month 1pm-2pm, Journey Room  > Creative Journey  3rd Tuesday of the month 1130am-1pm, Journey Room  > Look Good Feel Better  1st Wednesday of the month 10am-12 noon, Journey Room (Call [facility] to register [phone])

## 2016-08-21 ENCOUNTER — Encounter (HOSPITAL_COMMUNITY): Payer: Self-pay | Admitting: Hematology & Oncology

## 2016-08-24 ENCOUNTER — Other Ambulatory Visit (HOSPITAL_COMMUNITY): Payer: Self-pay | Admitting: Pharmacist

## 2016-09-22 ENCOUNTER — Other Ambulatory Visit (HOSPITAL_COMMUNITY): Payer: Self-pay | Admitting: Hematology & Oncology

## 2016-09-28 ENCOUNTER — Other Ambulatory Visit (HOSPITAL_COMMUNITY): Payer: Self-pay | Admitting: Oncology

## 2016-10-13 DIAGNOSIS — J189 Pneumonia, unspecified organism: Secondary | ICD-10-CM

## 2016-10-13 HISTORY — DX: Pneumonia, unspecified organism: J18.9

## 2016-10-22 ENCOUNTER — Telehealth (HOSPITAL_COMMUNITY): Payer: Self-pay

## 2016-10-22 DIAGNOSIS — I313 Pericardial effusion (noninflammatory): Secondary | ICD-10-CM

## 2016-10-22 DIAGNOSIS — R059 Cough, unspecified: Secondary | ICD-10-CM

## 2016-10-22 DIAGNOSIS — R05 Cough: Secondary | ICD-10-CM

## 2016-10-22 DIAGNOSIS — J90 Pleural effusion, not elsewhere classified: Secondary | ICD-10-CM

## 2016-10-22 MED ORDER — BENZONATATE 200 MG PO CAPS: 200.0000 mg | ORAL_CAPSULE | Freq: Three times a day (TID) | ORAL | 0 refills | Status: DC | PRN

## 2016-10-22 NOTE — Telephone Encounter (Signed)
Patient called requesting medication for cough. States ahe cannot get in to see PCP until next Thursday. Refilled Tessalon per Dr. Whitney Muse. Left message on patients voicemail.

## 2016-10-23 ENCOUNTER — Emergency Department (HOSPITAL_COMMUNITY): Payer: Medicare Other

## 2016-10-23 ENCOUNTER — Inpatient Hospital Stay (HOSPITAL_COMMUNITY): Payer: Medicare Other

## 2016-10-23 ENCOUNTER — Encounter (HOSPITAL_COMMUNITY): Payer: Self-pay | Admitting: Cardiology

## 2016-10-23 ENCOUNTER — Inpatient Hospital Stay (HOSPITAL_COMMUNITY)
Admission: EM | Admit: 2016-10-23 | Discharge: 2016-11-04 | DRG: 166 | Disposition: A | Discharge status: Home Health | Payer: Medicare Other | Attending: Internal Medicine | Admitting: Internal Medicine

## 2016-10-23 DIAGNOSIS — R0602 Shortness of breath: Secondary | ICD-10-CM

## 2016-10-23 DIAGNOSIS — Y95 Nosocomial condition: Secondary | ICD-10-CM | POA: Diagnosis present

## 2016-10-23 DIAGNOSIS — C9210 Chronic myeloid leukemia, BCR/ABL-positive, not having achieved remission: Secondary | ICD-10-CM | POA: Diagnosis present

## 2016-10-23 DIAGNOSIS — E870 Hyperosmolality and hypernatremia: Secondary | ICD-10-CM | POA: Diagnosis not present

## 2016-10-23 DIAGNOSIS — J9811 Atelectasis: Secondary | ICD-10-CM | POA: Diagnosis not present

## 2016-10-23 DIAGNOSIS — D72829 Elevated white blood cell count, unspecified: Secondary | ICD-10-CM | POA: Diagnosis not present

## 2016-10-23 DIAGNOSIS — J939 Pneumothorax, unspecified: Secondary | ICD-10-CM

## 2016-10-23 DIAGNOSIS — J90 Pleural effusion, not elsewhere classified: Secondary | ICD-10-CM | POA: Diagnosis present

## 2016-10-23 DIAGNOSIS — Z79899 Other long term (current) drug therapy: Secondary | ICD-10-CM

## 2016-10-23 DIAGNOSIS — E039 Hypothyroidism, unspecified: Secondary | ICD-10-CM | POA: Diagnosis present

## 2016-10-23 DIAGNOSIS — F329 Major depressive disorder, single episode, unspecified: Secondary | ICD-10-CM | POA: Diagnosis present

## 2016-10-23 DIAGNOSIS — R911 Solitary pulmonary nodule: Secondary | ICD-10-CM | POA: Diagnosis present

## 2016-10-23 DIAGNOSIS — Z888 Allergy status to other drugs, medicaments and biological substances status: Secondary | ICD-10-CM

## 2016-10-23 DIAGNOSIS — K2960 Other gastritis without bleeding: Secondary | ICD-10-CM | POA: Diagnosis present

## 2016-10-23 DIAGNOSIS — Z885 Allergy status to narcotic agent status: Secondary | ICD-10-CM

## 2016-10-23 DIAGNOSIS — Z882 Allergy status to sulfonamides status: Secondary | ICD-10-CM

## 2016-10-23 DIAGNOSIS — J189 Pneumonia, unspecified organism: Secondary | ICD-10-CM | POA: Diagnosis present

## 2016-10-23 DIAGNOSIS — J181 Lobar pneumonia, unspecified organism: Secondary | ICD-10-CM

## 2016-10-23 DIAGNOSIS — J029 Acute pharyngitis, unspecified: Secondary | ICD-10-CM | POA: Diagnosis present

## 2016-10-23 DIAGNOSIS — I313 Pericardial effusion (noninflammatory): Secondary | ICD-10-CM | POA: Diagnosis present

## 2016-10-23 DIAGNOSIS — R262 Difficulty in walking, not elsewhere classified: Secondary | ICD-10-CM

## 2016-10-23 DIAGNOSIS — F419 Anxiety disorder, unspecified: Secondary | ICD-10-CM | POA: Diagnosis present

## 2016-10-23 LAB — CBC WITH DIFFERENTIAL/PLATELET
BASOS ABS: 0 10*3/uL (ref 0.0–0.1)
BASOS PCT: 0 %
EOS PCT: 0 %
Eosinophils Absolute: 0 10*3/uL (ref 0.0–0.7)
HCT: 36.8 % (ref 36.0–46.0)
Hemoglobin: 12.1 g/dL (ref 12.0–15.0)
Lymphocytes Relative: 8 %
Lymphs Abs: 1.5 10*3/uL (ref 0.7–4.0)
MCH: 28.9 pg (ref 26.0–34.0)
MCHC: 32.9 g/dL (ref 30.0–36.0)
MCV: 88 fL (ref 78.0–100.0)
MONO ABS: 2.5 10*3/uL — AB (ref 0.1–1.0)
Monocytes Relative: 14 %
Neutro Abs: 14.2 10*3/uL — ABNORMAL HIGH (ref 1.7–7.7)
Neutrophils Relative %: 78 %
PLATELETS: 381 10*3/uL (ref 150–400)
RBC: 4.18 MIL/uL (ref 3.87–5.11)
RDW: 13.6 % (ref 11.5–15.5)
WBC: 18.2 10*3/uL — ABNORMAL HIGH (ref 4.0–10.5)

## 2016-10-23 LAB — COMPREHENSIVE METABOLIC PANEL
ALBUMIN: 3.6 g/dL (ref 3.5–5.0)
ALT: 21 U/L (ref 14–54)
AST: 33 U/L (ref 15–41)
Alkaline Phosphatase: 154 U/L — ABNORMAL HIGH (ref 38–126)
Anion gap: 10 (ref 5–15)
BUN: 18 mg/dL (ref 6–20)
CHLORIDE: 98 mmol/L — AB (ref 101–111)
CO2: 25 mmol/L (ref 22–32)
Calcium: 8.7 mg/dL — ABNORMAL LOW (ref 8.9–10.3)
Creatinine, Ser: 0.94 mg/dL (ref 0.44–1.00)
GFR calc Af Amer: >60 mL/min (ref >60)
GFR, EST NON AFRICAN AMERICAN: 60 mL/min — AB (ref >60)
GLUCOSE: 131 mg/dL — AB (ref 65–99)
POTASSIUM: 3.7 mmol/L (ref 3.5–5.1)
Sodium: 133 mmol/L — ABNORMAL LOW (ref 135–145)
Total Bilirubin: 1.3 mg/dL — ABNORMAL HIGH (ref 0.3–1.2)
Total Protein: 8 g/dL (ref 6.5–8.1)

## 2016-10-23 LAB — BRAIN NATRIURETIC PEPTIDE: B Natriuretic Peptide: 68 pg/mL (ref 0.0–100.0)

## 2016-10-23 LAB — TROPONIN I

## 2016-10-23 MED ORDER — IOPAMIDOL (ISOVUE-300) INJECTION 61%
75.0000 mL | Freq: Once | INTRAVENOUS | Status: AC | PRN
  Administered 2016-10-23: 75 mL via INTRAVENOUS

## 2016-10-23 MED ORDER — SENNOSIDES-DOCUSATE SODIUM 8.6-50 MG PO TABS: 1.0000 | ORAL_TABLET | Freq: Every evening | ORAL | Status: DC | PRN

## 2016-10-23 MED ORDER — ONDANSETRON HCL 4 MG PO TABS
4.0000 mg | ORAL_TABLET | Freq: Four times a day (QID) | ORAL | Status: DC | PRN
  Administered 2016-10-29 – 2016-10-30 (×2): 4 mg via ORAL
  Filled 2016-10-23 (×2): qty 1

## 2016-10-23 MED ORDER — TRAMADOL HCL 50 MG PO TABS
50.0000 mg | ORAL_TABLET | Freq: Four times a day (QID) | ORAL | Status: DC | PRN
  Administered 2016-10-24 – 2016-10-25 (×5): 50 mg via ORAL
  Filled 2016-10-23 (×5): qty 1

## 2016-10-23 MED ORDER — ENOXAPARIN SODIUM 40 MG/0.4ML ~~LOC~~ SOLN
40.0000 mg | SUBCUTANEOUS | Status: DC
  Administered 2016-10-23 – 2016-10-24 (×2): 40 mg via SUBCUTANEOUS
  Filled 2016-10-23 (×2): qty 0.4

## 2016-10-23 MED ORDER — ONDANSETRON HCL 4 MG/2ML IJ SOLN
4.0000 mg | Freq: Four times a day (QID) | INTRAMUSCULAR | Status: DC | PRN
  Administered 2016-10-30 – 2016-10-31 (×4): 4 mg via INTRAVENOUS
  Filled 2016-10-23 (×4): qty 2

## 2016-10-23 MED ORDER — ESCITALOPRAM OXALATE 20 MG PO TABS
20.0000 mg | ORAL_TABLET | Freq: Every day | ORAL | Status: DC
  Administered 2016-10-23 – 2016-11-03 (×12): 20 mg via ORAL
  Filled 2016-10-23 (×3): qty 1
  Filled 2016-10-23: qty 2
  Filled 2016-10-23 (×8): qty 1

## 2016-10-23 MED ORDER — ALPRAZOLAM 0.25 MG PO TABS: 0.2500 mg | ORAL_TABLET | Freq: Four times a day (QID) | ORAL | Status: DC | PRN

## 2016-10-23 MED ORDER — ACETAMINOPHEN 325 MG PO TABS
650.0000 mg | ORAL_TABLET | Freq: Four times a day (QID) | ORAL | Status: DC | PRN
Start: 2016-10-23 — End: 2016-10-25
  Filled 2016-10-23: qty 2

## 2016-10-23 MED ORDER — ACETAMINOPHEN 650 MG RE SUPP: 650.0000 mg | Freq: Four times a day (QID) | RECTAL | Status: DC | PRN

## 2016-10-23 MED ORDER — ACETAMINOPHEN 500 MG PO TABS
1000.0000 mg | ORAL_TABLET | Freq: Once | ORAL | Status: AC
  Administered 2016-10-23: 1000 mg via ORAL
  Filled 2016-10-23: qty 2

## 2016-10-23 MED ORDER — TRAMADOL HCL 50 MG PO TABS
50.0000 mg | ORAL_TABLET | Freq: Once | ORAL | Status: AC
  Administered 2016-10-23: 50 mg via ORAL
  Filled 2016-10-23: qty 1

## 2016-10-23 NOTE — ED Notes (Signed)
Patient given peanut butter nabs and water with medicine until dinner tray arrives.

## 2016-10-23 NOTE — H&P (Signed)
History and Physical  Andrea Martinez [id number] DOB: [birth year] DOA: 10/23/2016  Referring physician: Dr Dayna Barker, ED physician PCP: [name], MD  Outpatient Specialists:   Dr Whitney Muse (oncology)  Chief Complaint: SOB  HPI: Andrea Martinez is a 70 y.o. female with a history of CML on chemotherapy, hypothyroidism, leukocytosis. She presents with 3 days of worsening shortness of breath acutely worse today. She is dyspneic on exertion to 10 of 15 feet. Improves at rest and on oxygen. Patient even has dyspnea with getting out of bed. Due to worsening symptoms, the patient presents to the hospital for evaluation  Emergency department course: Chest x-ray of the chest shows pleural effusion to the mid lung field. CT shows collapse of left lower lobe and left lingular lobe with 6 mm right lower lobe nodule. Patient satting well on nasal cannula  Review of Systems:   Pt denies any fevers, chills, nausea, vomiting, diarrhea, constipation, abdominal pain, cough, wheezing, palpitations, headache, vision changes, lightheadedness, dizziness, melena, rectal bleeding.  Review of systems are otherwise negative  Past Medical History:  Diagnosis Date  . Chronic myelogenous leukemia (CML), BCR-ABL1-positive ([place]) 11/20/2015  . Gastric ulcer 2011   EGD, 5/11  . Hypothyroidism    not on meds, followed by Dr. Elyse Hsu  . Leukocytosis 11/20/2015  . Lipoma    left upper arm  . PONV (postoperative nausea and vomiting)   . UTI (lower urinary tract infection)    frequent   Past Surgical History:  Procedure Laterality Date  . BRAVO [place] STUDY  12/04/2012   Procedure: BRAVO [place];  Surgeon: [name], MD;  [facility];  Service: Endoscopy;  Laterality: N/A;  . CHOLECYSTECTOMY N/A 01/03/2014   Procedure: LAPAROSCOPIC CHOLECYSTECTOMY WITH INTRAOPERATIVE CHOLANGIOGRAM;  Surgeon: Joyice Faster. Cornett, MD;  Location: [facility];  Service: General;  Laterality: N/A;  .  COLONOSCOPY  01/2004   DR [facility], few small tics  . ESOPHAGOGASTRODUODENOSCOPY  02/2010   gastric ulcers  . ESOPHAGOGASTRODUODENOSCOPY  12/04/2012   [id number] gastritis (inflammation) was found in the gastric antrum; multiple biopsies The duodenal mucosa showed no abnormalities in the bulb and second portion of the duodenum  . LIPOMA EXCISION  08/02/2011   left shoulder  . NOSE SURGERY    . PARTIAL HYSTERECTOMY     vaginal at age 54 years of age  . TOE DEBRIDEMENT  1962   lt great toe   Social History:  reports that she has never smoked. She has never used smokeless tobacco. She reports that she does not drink alcohol or use drugs. Patient lives at home  Allergies  Allergen Reactions  . Codeine Nausea Only    Headache  . Esomeprazole Magnesium Nausea Only  . Sulfonamide Derivatives Nausea Only    Headache  . Fluconazole Rash    Redness and blistering on left thigh    Family History  Problem Relation Age of Onset  . Lung cancer Father   . COPD Mother   . Colon cancer Neg Hx   . Anesthesia problems Neg Hx   . Hypotension Neg Hx   . Malignant hyperthermia Neg Hx   . Pseudochol deficiency Neg Hx       Prior to Admission medications   Medication Sig Start Date End Date Taking? Authorizing Provider  acetaminophen (TYLENOL) 500 MG tablet Take 500 mg by mouth every 6 (six) hours as needed.    Historical Provider, MD  ALPRAZolam Duanne Moron) 0.25 MG tablet Take 1  tablet (0.25 mg total) by mouth every 6 (six) hours as needed for anxiety. Patient not taking: Reported on 06/11/2016 01/01/16   Patrici Ranks, MD  benzonatate (TESSALON) 200 MG capsule Take 1 capsule (200 mg total) by mouth 3 (three) times daily as needed for cough. 10/22/16   Patrici Ranks, MD  cetirizine (ZYRTEC) 10 MG tablet Take 10 mg by mouth as needed for allergies.    Historical Provider, MD  Cholecalciferol (VITAMIN D-3) 5000 UNITS TABS Take 5,000 Units by mouth daily.      Historical Provider, MD    Cholestyramine Light (PREVALITE PO) Take 4 g by mouth as needed. Reported on 06/11/2016    Historical Provider, MD  conjugated estrogens (PREMARIN) vaginal cream Place 1 Applicatorful vaginally 2 (two) times a week. 1/2 to 1g    Historical Provider, MD  escitalopram (LEXAPRO) 20 MG tablet TAKE 1 TABLET (20 MG TOTAL) BY MOUTH DAILY. 09/27/16   Patrici Ranks, MD  Multiple Vitamins-Minerals (CENTRUM SILVER PO) Take 1 capsule by mouth daily.    Historical Provider, MD  nilotinib (TASIGNA) 150 MG capsule Take 2 capsules (300 mg total) by mouth every 12 (twelve) hours. 02/13/16   Patrici Ranks, MD  ondansetron (ZOFRAN) 8 MG tablet Take 1 tablet (8 mg total) by mouth every 8 (eight) hours as needed for nausea or vomiting. Patient not taking: Reported on 06/11/2016 12/31/15   Patrici Ranks, MD  Probiotic Product (DIGESTIVE ADVANTAGE GUMMIES PO) Take 2 tablets by mouth daily.    Historical Provider, MD  prochlorperazine (COMPAZINE) 10 MG tablet Take 1 tablet (10 mg total) by mouth every 6 (six) hours as needed for nausea or vomiting. Patient not taking: Reported on 08/20/2016 03/30/16   Patrici Ranks, MD  promethazine (PHENERGAN) 25 MG tablet Take 1 tablet (25 mg total) by mouth every 6 (six) hours as needed for nausea or vomiting. Patient not taking: Reported on 08/20/2016 03/30/16   Patrici Ranks, MD  traMADol (ULTRAM) 50 MG tablet Take 1 tablet (50 mg total) by mouth every 6 (six) hours as needed. 06/07/16   Baird Cancer, PA-C  triamcinolone cream (KENALOG) 0.1 % Apply 1 application topically 2 (two) times daily. 01/26/16   Patrici Ranks, MD  vitamin B-12 (CYANOCOBALAMIN) 1000 MCG tablet Take 5,000 mcg by mouth daily.     Historical Provider, MD    Physical Exam: BP 137/59   Pulse 86   Temp 98.7 F (37.1 C) (Oral)   Resp 21   Ht 5\' 6"  (1.676 m)   Wt 88 kg (194 lb)   SpO2 97%   BMI 31.31 kg/m   General: Elderly Caucasian female. Awake and alert and oriented x3. No acute  cardiopulmonary distress.  HEENT: Normocephalic atraumatic.  Right and left ears normal in appearance.  Pupils equal, round, reactive to light. Extraocular muscles are intact. Sclerae anicteric and noninjected.  Moist mucosal membranes. No mucosal lesions.  Neck: Neck supple without lymphadenopathy. No carotid bruits. No masses palpated.  Cardiovascular: Regular rate with normal S1-S2 sounds. No murmurs, rubs, gallops auscultated. No JVD.  Respiratory: Absent breath sounds in the left lower and midlung fields. Abdomen: Soft, nontender, nondistended. Active bowel sounds. No masses or hepatosplenomegaly  Skin: No rashes, lesions, or ulcerations.  Dry, warm to touch. 2+ dorsalis pedis and radial pulses. Musculoskeletal: No calf or leg pain. All major joints not erythematous nontender.  No upper or lower joint deformation.  Good ROM.  No contractures  Psychiatric:  Intact judgment and insight. Pleasant and cooperative. Neurologic: No focal neurological deficits. Strength is 5/5 and symmetric in upper and lower extremities.  Cranial nerves II through XII are grossly intact.           Labs on Admission: I have personally reviewed following labs and imaging studies  CBC:  Recent Labs Lab 10/23/16 1450  WBC 18.2*  NEUTROABS 14.2*  HGB 12.1  HCT 36.8  MCV 88.0  PLT 123XX123   Basic Metabolic Panel:  Recent Labs Lab 10/23/16 1450  NA 133*  K 3.7  CL 98*  CO2 25  GLUCOSE 131*  BUN 18  CREATININE 0.94  CALCIUM 8.7*   GFR: Estimated Creatinine Clearance: 61.4 mL/min (by C-G formula based on SCr of 0.94 mg/dL). Liver Function Tests:  Recent Labs Lab 10/23/16 1450  AST 33  ALT 21  ALKPHOS 154*  BILITOT 1.3*  PROT 8.0  ALBUMIN 3.6   No results for input(s): LIPASE, AMYLASE in the last 168 hours. No results for input(s): AMMONIA in the last 168 hours. Coagulation Profile: No results for input(s): INR, PROTIME in the last 168 hours. Cardiac Enzymes:  Recent Labs Lab  10/23/16 1450  TROPONINI <0.03   BNP (last 3 results) No results for input(s): PROBNP in the last 8760 hours. HbA1C: No results for input(s): HGBA1C in the last 72 hours. CBG: No results for input(s): GLUCAP in the last 168 hours. Lipid Profile: No results for input(s): CHOL, HDL, LDLCALC, TRIG, CHOLHDL, LDLDIRECT in the last 72 hours. Thyroid Function Tests: No results for input(s): TSH, T4TOTAL, FREET4, T3FREE, THYROIDAB in the last 72 hours. Anemia Panel: No results for input(s): VITAMINB12, FOLATE, FERRITIN, TIBC, IRON, RETICCTPCT in the last 72 hours. Urine analysis:    Component Value Date/Time   COLORURINE YELLOW 03/12/2016 1100   APPEARANCEUR CLEAR 03/12/2016 1100   LABSPEC 1.015 03/12/2016 1100   PHURINE 6.0 03/12/2016 1100   GLUCOSEU NEGATIVE 03/12/2016 1100   HGBUR NEGATIVE 03/12/2016 1100   BILIRUBINUR NEGATIVE 03/12/2016 1100   KETONESUR NEGATIVE 03/12/2016 1100   PROTEINUR NEGATIVE 03/12/2016 1100   UROBILINOGEN 0.2 09/22/2007 1157   NITRITE NEGATIVE 03/12/2016 1100   LEUKOCYTESUR NEGATIVE 03/12/2016 1100   Sepsis Labs: @LABRCNTIP (procalcitonin:4,lacticidven:4) )No results found for this or any previous visit (from the past 240 hour(s)).   Radiological Exams on Admission: Dg Chest 2 View  Result Date: 10/23/2016 CLINICAL DATA:  Shortness of breath and chest pain. EXAM: CHEST  2 VIEW COMPARISON:  November 01, 2011 FINDINGS: No pneumothorax. Significant opacification of the left hemi thorax with only a small amount of apex aerated. On the lateral view, there appears to be a mass located superiorly measuring 4.8 cm. There is probably associated effusion. No other acute abnormalities. Mild atelectasis in the right base. IMPRESSION: 1. Significant opacification of left hemi thorax with an apparent 4.8 cm mass seen on the lateral view. Probable associated effusion. Recommend CT scan. Electronically Signed   By: Dorise Bullion III M.D   On: 10/23/2016 15:52   Ct  Chest W Contrast  Result Date: 10/23/2016 CLINICAL DATA:  Left pleural effusion. Patient is very short of breath. EXAM: CT CHEST WITH CONTRAST TECHNIQUE: Multidetector CT imaging of the chest was performed during intravenous contrast administration. CONTRAST:  55mL ISOVUE-300 IOPAMIDOL (ISOVUE-300) INJECTION 61% COMPARISON:  08/13/2009. FINDINGS: Cardiovascular: Heart size is normal. Small to moderate pericardial effusion noted. There is abdominal aortic atherosclerosis without aneurysm. No evidence for thoracic aortic dissection. Mediastinum/Nodes: 12 mm short axis subcarinal lymph node  is mildly enlarged. 10 mm borderline enlarged short axis lymph node identified right hilum. 10 mm short axis lymph node identified left hilum. Lungs/Pleura: Left lower lobe and lingular collapse is associated with a moderate left pleural effusion demonstrating areas of loculation. No discernible pleural thickening or nodularity. No evidence for right pleural effusion. No focal airspace consolidation on the right. 8 mm right lower lobe pulmonary nodule is seen on image 112 of series 4. Upper Abdomen: Unremarkable Musculoskeletal: Bone windows reveal no worrisome lytic or sclerotic osseous lesions. IMPRESSION: 1. Lingular and left lower lobe collapse associated with moderate loculated left pleural effusion. Although no central obstructing mass lesion is evident, given unilateral character of disease, malignancy must be excluded. 2. Borderline mediastinal and bilateral hilar lymphadenopathy. 3. 8 mm right lower lobe pulmonary nodule. Attention on follow-up imaging recommended. Electronically Signed   By: Misty Stanley M.D.   On: 10/23/2016 17:25    EKG: Independently reviewed. Sinus tachycardia  Assessment/Plan: Principal Problem:   Pleural effusion Active Problems:   Chronic myelogenous leukemia (CML), BCR-ABL1-positive ([facility])   Solitary pulmonary nodule    This patient was discussed with the ED physician, including  pertinent vitals, physical exam findings, labs, and imaging.  We also discussed care given by the ED provider.  #1 pleural effusion  Admit  Discussed patient with interventional radiology - as patient stable, can wait until Monday. If patient becomes unstable, will call IR and arrange transportation for the patient to go to [facility] for thoracentesis.  Obviously, as patient currently being treated with CML and has a pulmonary nodule, the patient needs to be ruled out for oncologic disease #2 pulmonary nodule #3 CML  Hold chemotherapy per Dr. Whitney Muse  DVT prophylaxis: Lovenox Consultants: Interventional radiology Code Status: Full code Family Communication: None  Disposition Plan: Patient would likely be able to return home admission   Truett Mainland, DO [facility] Pager [phone]  If 7PM-7AM, please contact night-coverage [email or web address] Password [email or web address]

## 2016-10-23 NOTE — ED Triage Notes (Signed)
Left breast pain and sob times 3 days.

## 2016-10-23 NOTE — ED Provider Notes (Signed)
[facility] DEPT Provider Note   CSN: [id number] Arrival date & time: 10/23/16  1408     History   Chief Complaint Chief Complaint  Patient presents with  . Shortness of Breath    HPI Andrea Martinez is a 70 y.o. female.  70 year old female with a history of CML presents to the emergency department with at least 2 week history of cough and shortness of breath with significant worsening over the last 3 days. Patient states it feels like she is running up flight of stairs even with minimal exertion. She has some chest tightness under her left breast that associated with this. With these improve with rest but today is a lot worse or hasn't improved at all. Had some cough but no fever, diaphoresis, nausea, vomiting, abdominal symptoms. Has not had anything this bad before however did feel similar when she was diagnosed with CML last year.      Past Medical History:  Diagnosis Date  . Chronic myelogenous leukemia (CML), BCR-ABL1-positive ([place]) 11/20/2015  . Gastric ulcer 2011   EGD, 5/11  . Hypothyroidism    not on meds, followed by Dr. Elyse Hsu  . Leukocytosis 11/20/2015  . Lipoma    left upper arm  . PONV (postoperative nausea and vomiting)   . UTI (lower urinary tract infection)    frequent    Patient Active Problem List   Diagnosis Date Noted  . Chronic myelogenous leukemia (CML), BCR-ABL1-positive ([place]) 11/20/2015  . Biliary dyskinesia 12/04/2013  . Dyspepsia 11/30/2012  . Rib pain 11/01/2011  . Colon cancer screening 09/21/2011  . Lipoma 07/27/2011  . Lipoma of arm 07/27/2011  . Mass of arm 07/08/2011  . PUD 05/20/2010  . ABDOMINAL PAIN RIGHT UPPER QUADRANT 02/12/2010  . UNSPECIFIED DISORDER OF THYROID 02/11/2010  . NAUSEA 02/11/2010  . UTI'S, HX OF 02/11/2010    Past Surgical History:  Procedure Laterality Date  . BRAVO [place] STUDY  12/04/2012   Procedure: BRAVO [place];  Surgeon: [name], MD;  [facility];  Service: Endoscopy;   Laterality: N/A;  . CHOLECYSTECTOMY N/A 01/03/2014   Procedure: LAPAROSCOPIC CHOLECYSTECTOMY WITH INTRAOPERATIVE CHOLANGIOGRAM;  Surgeon: Joyice Faster. Cornett, MD;  Location: [facility];  Service: General;  Laterality: N/A;  . COLONOSCOPY  01/2004   DR [facility], few small tics  . ESOPHAGOGASTRODUODENOSCOPY  02/2010   gastric ulcers  . ESOPHAGOGASTRODUODENOSCOPY  12/04/2012   [id number] gastritis (inflammation) was found in the gastric antrum; multiple biopsies The duodenal mucosa showed no abnormalities in the bulb and second portion of the duodenum  . LIPOMA EXCISION  08/02/2011   left shoulder  . NOSE SURGERY    . PARTIAL HYSTERECTOMY     vaginal at age 89 years of age  . TOE DEBRIDEMENT  1962   lt great toe    OB History    No data available       Home Medications    Prior to Admission medications   Medication Sig Start Date End Date Taking? Authorizing Provider  acetaminophen (TYLENOL) 500 MG tablet Take 500 mg by mouth every 6 (six) hours as needed.    Historical Provider, MD  ALPRAZolam Duanne Moron) 0.25 MG tablet Take 1 tablet (0.25 mg total) by mouth every 6 (six) hours as needed for anxiety. Patient not taking: Reported on 06/11/2016 01/01/16   Patrici Ranks, MD  benzonatate (TESSALON) 200 MG capsule Take 1 capsule (200 mg total) by mouth 3 (three) times daily as needed for cough.  10/22/16   Patrici Ranks, MD  cetirizine (ZYRTEC) 10 MG tablet Take 10 mg by mouth as needed for allergies.    Historical Provider, MD  Cholecalciferol (VITAMIN D-3) 5000 UNITS TABS Take 5,000 Units by mouth daily.      Historical Provider, MD  Cholestyramine Light (PREVALITE PO) Take 4 g by mouth as needed. Reported on 06/11/2016    Historical Provider, MD  conjugated estrogens (PREMARIN) vaginal cream Place 1 Applicatorful vaginally 2 (two) times a week. 1/2 to 1g    Historical Provider, MD  escitalopram (LEXAPRO) 20 MG tablet TAKE 1 TABLET (20 MG TOTAL) BY MOUTH DAILY. 09/27/16    Patrici Ranks, MD  Multiple Vitamins-Minerals (CENTRUM SILVER PO) Take 1 capsule by mouth daily.    Historical Provider, MD  nilotinib (TASIGNA) 150 MG capsule Take 2 capsules (300 mg total) by mouth every 12 (twelve) hours. 02/13/16   Patrici Ranks, MD  ondansetron (ZOFRAN) 8 MG tablet Take 1 tablet (8 mg total) by mouth every 8 (eight) hours as needed for nausea or vomiting. Patient not taking: Reported on 06/11/2016 12/31/15   Patrici Ranks, MD  Probiotic Product (DIGESTIVE ADVANTAGE GUMMIES PO) Take 2 tablets by mouth daily.    Historical Provider, MD  prochlorperazine (COMPAZINE) 10 MG tablet Take 1 tablet (10 mg total) by mouth every 6 (six) hours as needed for nausea or vomiting. Patient not taking: Reported on 08/20/2016 03/30/16   Patrici Ranks, MD  promethazine (PHENERGAN) 25 MG tablet Take 1 tablet (25 mg total) by mouth every 6 (six) hours as needed for nausea or vomiting. Patient not taking: Reported on 08/20/2016 03/30/16   Patrici Ranks, MD  traMADol (ULTRAM) 50 MG tablet Take 1 tablet (50 mg total) by mouth every 6 (six) hours as needed. 06/07/16   Baird Cancer, PA-C  triamcinolone cream (KENALOG) 0.1 % Apply 1 application topically 2 (two) times daily. 01/26/16   Patrici Ranks, MD  vitamin B-12 (CYANOCOBALAMIN) 1000 MCG tablet Take 5,000 mcg by mouth daily.     Historical Provider, MD    Family History Family History  Problem Relation Age of Onset  . Lung cancer Father   . COPD Mother   . Colon cancer Neg Hx   . Anesthesia problems Neg Hx   . Hypotension Neg Hx   . Malignant hyperthermia Neg Hx   . Pseudochol deficiency Neg Hx     Social History Social History  Substance Use Topics  . Smoking status: Never Smoker  . Smokeless tobacco: Never Used  . Alcohol use No     Allergies   Codeine; Esomeprazole magnesium; Sulfonamide derivatives; and Fluconazole   Review of Systems Review of Systems  Constitutional: Positive for activity change and  fatigue. Negative for appetite change.  Respiratory: Positive for cough and shortness of breath.   Cardiovascular: Positive for chest pain.  Gastrointestinal: Negative for abdominal distention and abdominal pain.  Genitourinary: Negative for dysuria.  All other systems reviewed and are negative.    Physical Exam Updated Vital Signs Ht 5\' 6"  (1.676 m)   Wt 194 lb (88 kg)   BMI 31.31 kg/m   Physical Exam  Constitutional: She appears well-developed and well-nourished.  HENT:  Head: Normocephalic and atraumatic.  Eyes: Conjunctivae and EOM are normal.  Neck: Normal range of motion.  Cardiovascular: Normal rate and regular rhythm.   Pulmonary/Chest: No stridor. Tachypnea noted. No respiratory distress. She has decreased breath sounds (significantly on left).  Desaturates  to 90% on room air and becomes more air hungry and tachypneic. On 4L she has significant improvement.   Abdominal: She exhibits no distension.  Neurological: She is alert.  Nursing note and vitals reviewed.    ED Treatments / Results  Labs (all labs ordered are listed, but only abnormal results are displayed) Labs Reviewed  CBC WITH DIFFERENTIAL/PLATELET - Abnormal; Notable for the following:       Result Value   WBC 18.2 (*)    Neutro Abs 14.2 (*)    Monocytes Absolute 2.5 (*)    All other components within normal limits  COMPREHENSIVE METABOLIC PANEL - Abnormal; Notable for the following:    Sodium 133 (*)    Chloride 98 (*)    Glucose, Bld 131 (*)    Calcium 8.7 (*)    Alkaline Phosphatase 154 (*)    Total Bilirubin 1.3 (*)    GFR calc non Af Amer 60 (*)    All other components within normal limits  BASIC METABOLIC PANEL - Abnormal; Notable for the following:    Glucose, Bld 117 (*)    Calcium 8.5 (*)    All other components within normal limits  CBC - Abnormal; Notable for the following:    WBC 12.5 (*)    Hemoglobin 11.0 (*)    HCT 34.7 (*)    All other components within normal limits    TROPONIN I  BRAIN NATRIURETIC PEPTIDE  CBC  COMPREHENSIVE METABOLIC PANEL  BLOOD GAS, ARTERIAL  PROTIME-INR  APTT  URINALYSIS, ROUTINE W REFLEX MICROSCOPIC (NOT AT [facility])  TYPE AND SCREEN    EKG  EKG Interpretation  Date/Time:  Saturday October 23 2016 14:26:13 EST Ventricular Rate:  101 PR Interval:    QRS Duration: 87 QT Interval:  349 QTC Calculation: 453 R Axis:   20 Text Interpretation:  Sinus tachycardia Borderline repolarization abnormality nonspecific ST changes in I, II, V3 different from previous Confirmed by [facility] MD, Gurvir Schrom [id number]) on 10/23/2016 2:33:42 PM       Radiology Dg Chest 2 View  Result Date: 10/23/2016 CLINICAL DATA:  Shortness of breath and chest pain. EXAM: CHEST  2 VIEW COMPARISON:  November 01, 2011 FINDINGS: No pneumothorax. Significant opacification of the left hemi thorax with only a small amount of apex aerated. On the lateral view, there appears to be a mass located superiorly measuring 4.8 cm. There is probably associated effusion. No other acute abnormalities. Mild atelectasis in the right base. IMPRESSION: 1. Significant opacification of left hemi thorax with an apparent 4.8 cm mass seen on the lateral view. Probable associated effusion. Recommend CT scan. Electronically Signed   By: Dorise Bullion III M.D   On: 10/23/2016 15:52   Procedures Procedures (including critical care time)  Medications Ordered in ED Medications  escitalopram (LEXAPRO) tablet 20 mg (20 mg Oral Given 10/23/16 2345)  traMADol (ULTRAM) tablet 50 mg (50 mg Oral Given 10/24/16 1033)  ALPRAZolam (XANAX) tablet 0.25 mg (not administered)  acetaminophen (TYLENOL) tablet 650 mg (not administered)    Or  acetaminophen (TYLENOL) suppository 650 mg (not administered)  enoxaparin (LOVENOX) injection 40 mg (40 mg Subcutaneous Not Given 10/23/16 2355)  senna-docusate (Senokot-S) tablet 1 tablet (not administered)  ondansetron (ZOFRAN) tablet 4 mg (not administered)    Or   ondansetron (ZOFRAN) injection 4 mg (not administered)  acetaminophen (TYLENOL) tablet 1,000 mg (1,000 mg Oral Given 10/23/16 1711)  iopamidol (ISOVUE-300) 61 % injection 75 mL (75 mLs Intravenous Contrast Given 10/23/16 1706)  traMADol (ULTRAM) tablet 50 mg (50 mg Oral Given 10/23/16 1723)     Initial Impression / Assessment and Plan / ED Course  I have reviewed the triage vital signs and the nursing notes.  Pertinent labs & imaging results that were available during my care of the patient were reviewed by me and considered in my medical decision making (see chart for details).  Clinical Course    Suspect pleural effusion v pneumothorax. Will xr and eval for cardiac/pe causes if negative.  xr w/ e/o pleural effusion and likely new mass in left lung of 4.8cm. Will CT and admit to medicine for further workup/management. No distress now requiring emergent thoracentesis.   Final Clinical Impressions(s) / ED Diagnoses   Final diagnoses:  Pleural effusion    New Prescriptions New Prescriptions   No medications on file     Merrily Pew, MD 10/24/16 1316

## 2016-10-23 NOTE — ED Notes (Signed)
Dinner Tray taken up with patient.

## 2016-10-24 ENCOUNTER — Encounter (HOSPITAL_COMMUNITY): Payer: Self-pay | Admitting: Family Medicine

## 2016-10-24 DIAGNOSIS — J90 Pleural effusion, not elsewhere classified: Principal | ICD-10-CM

## 2016-10-24 LAB — CBC
HCT: 33.9 % — ABNORMAL LOW (ref 36.0–46.0)
HEMATOCRIT: 34.7 % — AB (ref 36.0–46.0)
HEMOGLOBIN: 11 g/dL — AB (ref 12.0–15.0)
HEMOGLOBIN: 10.9 g/dL — AB (ref 12.0–15.0)
MCH: 28.1 pg (ref 26.0–34.0)
MCH: 27.9 pg (ref 26.0–34.0)
MCHC: 31.7 g/dL (ref 30.0–36.0)
MCHC: 32.2 g/dL (ref 30.0–36.0)
MCV: 88.7 fL (ref 78.0–100.0)
MCV: 86.9 fL (ref 78.0–100.0)
PLATELETS: 336 10*3/uL (ref 150–400)
Platelets: 344 10*3/uL (ref 150–400)
RBC: 3.91 MIL/uL (ref 3.87–5.11)
RBC: 3.9 MIL/uL (ref 3.87–5.11)
RDW: 13.6 % (ref 11.5–15.5)
RDW: 13.7 % (ref 11.5–15.5)
WBC: 12.5 10*3/uL — AB (ref 4.0–10.5)
WBC: 14 10*3/uL — AB (ref 4.0–10.5)

## 2016-10-24 LAB — URINE MICROSCOPIC-ADD ON

## 2016-10-24 LAB — TYPE AND SCREEN
ABO/RH(D): A POS
ANTIBODY SCREEN: NEGATIVE

## 2016-10-24 LAB — BASIC METABOLIC PANEL
ANION GAP: 9 (ref 5–15)
BUN: 18 mg/dL (ref 6–20)
CO2: 26 mmol/L (ref 22–32)
Calcium: 8.5 mg/dL — ABNORMAL LOW (ref 8.9–10.3)
Chloride: 101 mmol/L (ref 101–111)
Creatinine, Ser: 0.7 mg/dL (ref 0.44–1.00)
Glucose, Bld: 117 mg/dL — ABNORMAL HIGH (ref 65–99)
POTASSIUM: 3.7 mmol/L (ref 3.5–5.1)
SODIUM: 136 mmol/L (ref 135–145)

## 2016-10-24 LAB — COMPREHENSIVE METABOLIC PANEL
ALK PHOS: 127 U/L — AB (ref 38–126)
ALT: 21 U/L (ref 14–54)
AST: 29 U/L (ref 15–41)
Albumin: 3 g/dL — ABNORMAL LOW (ref 3.5–5.0)
Anion gap: 9 (ref 5–15)
BUN: 14 mg/dL (ref 6–20)
CALCIUM: 8.6 mg/dL — AB (ref 8.9–10.3)
CHLORIDE: 101 mmol/L (ref 101–111)
CO2: 26 mmol/L (ref 22–32)
CREATININE: 0.8 mg/dL (ref 0.44–1.00)
Glucose, Bld: 113 mg/dL — ABNORMAL HIGH (ref 65–99)
Potassium: 3.9 mmol/L (ref 3.5–5.1)
Sodium: 136 mmol/L (ref 135–145)
Total Bilirubin: 0.6 mg/dL (ref 0.3–1.2)
Total Protein: 6.2 g/dL — ABNORMAL LOW (ref 6.5–8.1)

## 2016-10-24 LAB — BLOOD GAS, ARTERIAL
Acid-Base Excess: 1 mmol/L (ref 0.0–2.0)
BICARBONATE: 24.7 mmol/L (ref 20.0–28.0)
Drawn by: 35849
O2 CONTENT: 2 L/min
O2 Saturation: 94.3 %
PATIENT TEMPERATURE: 98.6
PCO2 ART: 36.8 mmHg (ref 32.0–48.0)
PO2 ART: 69.7 mmHg — AB (ref 83.0–108.0)
pH, Arterial: 7.442 (ref 7.350–7.450)

## 2016-10-24 LAB — URINALYSIS, ROUTINE W REFLEX MICROSCOPIC
Bilirubin Urine: NEGATIVE
Glucose, UA: NEGATIVE mg/dL
Hgb urine dipstick: NEGATIVE
Ketones, ur: NEGATIVE mg/dL
Nitrite: NEGATIVE
PROTEIN: 30 mg/dL — AB
SPECIFIC GRAVITY, URINE: 1.038 — AB (ref 1.005–1.030)
pH: 5.5 (ref 5.0–8.0)

## 2016-10-24 LAB — TROPONIN I: TROPONIN I: 0.03 ng/mL — AB (ref <0.03)

## 2016-10-24 LAB — APTT: APTT: 37 s — AB (ref 24–36)

## 2016-10-24 LAB — PROTIME-INR
INR: 1.15
Prothrombin Time: 14.7 seconds (ref 11.4–15.2)

## 2016-10-24 LAB — ABO/RH: ABO/RH(D): A POS

## 2016-10-24 MED ORDER — VANCOMYCIN HCL IN DEXTROSE 1-5 GM/200ML-% IV SOLN
1000.0000 mg | INTRAVENOUS | Status: AC
  Administered 2016-10-25: 1000 mg via INTRAVENOUS
  Filled 2016-10-24 (×4): qty 200

## 2016-10-24 MED ORDER — ASPIRIN 325 MG PO TABS
325.0000 mg | ORAL_TABLET | Freq: Every day | ORAL | Status: DC
  Administered 2016-10-24 – 2016-11-04 (×11): 325 mg via ORAL
  Filled 2016-10-24 (×11): qty 1

## 2016-10-24 MED ORDER — ALPRAZOLAM 0.25 MG PO TABS: 0.2500 mg | ORAL_TABLET | Freq: Four times a day (QID) | ORAL | Status: DC | PRN

## 2016-10-24 MED ORDER — KCL IN DEXTROSE-NACL 20-5-0.45 MEQ/L-%-% IV SOLN
INTRAVENOUS | Status: DC
  Administered 2016-10-25: 01:00:00 via INTRAVENOUS
  Filled 2016-10-24: qty 1000

## 2016-10-24 NOTE — H&P (Deleted)
[facility] update note  Patient transferred over from [facility] for evaluation by CT surgery. Patient currently complaining of chest discomfort rated as 8 out of 10. She denies symptoms of ACS such as diaphoresis or radiation of chest pain. IV dilated ordered when necessary 0.5 mg. Patient with negative troponin 1 so far. We'll get repeat. Patient otherwise appears well. Will prep patient for surgery in a.m.  Elwin Mocha MD

## 2016-10-24 NOTE — Progress Notes (Signed)
Pt has arrived to [facility] from [facility]. Telemetry box has been applied and CCMD has been notified. Pt has been oriented to room. Pt is on 3 liters of oxygen, which is what she arrived on. Pt's oxygen saturation is 97-98%. Pt denies needs at this time. MD has been paged. Will continue current plan of care.   Grant Fontana BSN, RN

## 2016-10-24 NOTE — Progress Notes (Addendum)
CRITICAL VALUE ALERT  Critical value received:  Troponin 0.03  Date of notification:  10/24/2016  Time of notification:  1525  Critical value read back: yes  Nurse who received alert:  Grant Fontana  MD notified (1st page):  Hobbs  Time of first page:  1609  MD notified (2nd page): Hobbs  Time of second page: 1644  Responding MD:  Aggie Moats  Time MD responded:  [phone]

## 2016-10-24 NOTE — Progress Notes (Signed)
Subjective: She was admitted yesterday with a left pleural effusion left lower lobe and lingula lobar collapse and a pericardial effusion. She says this is been giving her trouble for about 3 days. She is short of breath after walking 10-15 feet. She also feels like she's got swelling in her abdomen. 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  Objective: Vital signs in last 24 hours: Temp:  [97.9 F (36.6 C)-98.7 F (37.1 C)] 98.3 F (36.8 C) (11/12 0600) Pulse Rate:  [74-104] 82 (11/12 0600) Resp:  [18-27] 22 (11/12 0600) BP: (126-149)/(57-76) 134/62 (11/12 0600) SpO2:  [93 %-99 %] 94 % (11/12 0600) Weight:  [88 kg (194 lb)] 88 kg (194 lb) (11/11 1415) Weight change:  Last BM Date: 10/23/16  Intake/Output from previous day: 11/11 0701 - 11/12 0700 In: -  Out: 200 [Urine:200]  PHYSICAL EXAM General appearance: alert, cooperative and mild distress Resp: Diminished breath sounds on the left Cardio: regular rate and rhythm, S1, S2 normal, no murmur, click, rub or gallop GI: Soft protuberant bowel sounds are present Extremities: extremities normal, atraumatic, no cyanosis or edema Mucous membranes are moist. Pupils react. Skin warm and dry  Lab Results:  Results for orders placed or performed during the hospital encounter of 10/23/16 (from the past 48 hour(s))  CBC with Differential     Status: Abnormal   Collection Time: 10/23/16  2:50 PM  Result Value Ref Range   WBC 18.2 (H) 4.0 - 10.5 K/uL   RBC 4.18 3.87 - 5.11 MIL/uL   Hemoglobin 12.1 12.0 - 15.0 g/dL   HCT 36.8 36.0 - 46.0 %   MCV 88.0 78.0 - 100.0 fL   MCH 28.9 26.0 - 34.0 pg   MCHC 32.9 30.0 - 36.0 g/dL   RDW 13.6 11.5 - 15.5 %   Platelets 381 150 - 400 K/uL   Neutrophils Relative % 78 %   Neutro Abs 14.2 (H) 1.7 - 7.7 K/uL   Lymphocytes Relative 8 %   Lymphs Abs 1.5 0.7 - 4.0 K/uL   Monocytes Relative 14 %   Monocytes Absolute 2.5 (H) 0.1 - 1.0 K/uL   Eosinophils Relative 0  %   Eosinophils Absolute 0.0 0.0 - 0.7 K/uL   Basophils Relative 0 %   Basophils Absolute 0.0 0.0 - 0.1 K/uL  Comprehensive metabolic panel     Status: Abnormal   Collection Time: 10/23/16  2:50 PM  Result Value Ref Range   Sodium 133 (L) 135 - 145 mmol/L   Potassium 3.7 3.5 - 5.1 mmol/L   Chloride 98 (L) 101 - 111 mmol/L   CO2 25 22 - 32 mmol/L   Glucose, Bld 131 (H) 65 - 99 mg/dL   BUN 18 6 - 20 mg/dL   Creatinine, Ser 0.94 0.44 - 1.00 mg/dL   Calcium 8.7 (L) 8.9 - 10.3 mg/dL   Total Protein 8.0 6.5 - 8.1 g/dL   Albumin 3.6 3.5 - 5.0 g/dL   AST 33 15 - 41 U/L   ALT 21 14 - 54 U/L   Alkaline Phosphatase 154 (H) 38 - 126 U/L   Total Bilirubin 1.3 (H) 0.3 - 1.2 mg/dL   GFR calc non Af Amer 60 (L) >60 mL/min   GFR calc Af Amer >60 >60 mL/min    Comment: (NOTE) The eGFR has been calculated using the CKD EPI equation. This calculation has not been validated in all clinical situations. eGFR's persistently <60 mL/min signify possible Chronic Kidney  Disease.    Anion gap 10 5 - 15  Troponin I     Status: None   Collection Time: 10/23/16  2:50 PM  Result Value Ref Range   Troponin I <0.03 <0.03 ng/mL  Brain natriuretic peptide     Status: None   Collection Time: 10/23/16  2:51 PM  Result Value Ref Range   B Natriuretic Peptide 68.0 0.0 - 100.0 pg/mL  Basic metabolic panel     Status: Abnormal   Collection Time: 10/24/16  6:20 AM  Result Value Ref Range   Sodium 136 135 - 145 mmol/L   Potassium 3.7 3.5 - 5.1 mmol/L   Chloride 101 101 - 111 mmol/L   CO2 26 22 - 32 mmol/L   Glucose, Bld 117 (H) 65 - 99 mg/dL   BUN 18 6 - 20 mg/dL   Creatinine, Ser 0.70 0.44 - 1.00 mg/dL   Calcium 8.5 (L) 8.9 - 10.3 mg/dL   GFR calc non Af Amer >60 >60 mL/min   GFR calc Af Amer >60 >60 mL/min    Comment: (NOTE) The eGFR has been calculated using the CKD EPI equation. This calculation has not been validated in all clinical situations. eGFR's persistently <60 mL/min signify possible Chronic  Kidney Disease.    Anion gap 9 5 - 15  CBC     Status: Abnormal   Collection Time: 10/24/16  6:20 AM  Result Value Ref Range   WBC 12.5 (H) 4.0 - 10.5 K/uL   RBC 3.91 3.87 - 5.11 MIL/uL   Hemoglobin 11.0 (L) 12.0 - 15.0 g/dL   HCT 34.7 (L) 36.0 - 46.0 %   MCV 88.7 78.0 - 100.0 fL   MCH 28.1 26.0 - 34.0 pg   MCHC 31.7 30.0 - 36.0 g/dL   RDW 13.6 11.5 - 15.5 %   Platelets 344 150 - 400 K/uL    ABGS No results for input(s): PHART, PO2ART, TCO2, HCO3 in the last 72 hours.  Invalid input(s): PCO2 CULTURES No results found for this or any previous visit (from the past 240 hour(s)). Studies/Results: Dg Chest 2 View  Result Date: 10/23/2016 CLINICAL DATA:  Shortness of breath and chest pain. EXAM: CHEST  2 VIEW COMPARISON:  November 01, 2011 FINDINGS: No pneumothorax. Significant opacification of the left hemi thorax with only a small amount of apex aerated. On the lateral view, there appears to be a mass located superiorly measuring 4.8 cm. There is probably associated effusion. No other acute abnormalities. Mild atelectasis in the right base. IMPRESSION: 1. Significant opacification of left hemi thorax with an apparent 4.8 cm mass seen on the lateral view. Probable associated effusion. Recommend CT scan. Electronically Signed   By: Dorise Bullion III M.D   On: 10/23/2016 15:52   Ct Chest W Contrast  Result Date: 10/23/2016 CLINICAL DATA:  Left pleural effusion. Patient is very short of breath. EXAM: CT CHEST WITH CONTRAST TECHNIQUE: Multidetector CT imaging of the chest was performed during intravenous contrast administration. CONTRAST:  32m ISOVUE-300 IOPAMIDOL (ISOVUE-300) INJECTION 61% COMPARISON:  08/13/2009. FINDINGS: Cardiovascular: Heart size is normal. Small to moderate pericardial effusion noted. There is abdominal aortic atherosclerosis without aneurysm. No evidence for thoracic aortic dissection. Mediastinum/Nodes: 12 mm short axis subcarinal lymph node is mildly enlarged. 10  mm borderline enlarged short axis lymph node identified right hilum. 10 mm short axis lymph node identified left hilum. Lungs/Pleura: Left lower lobe and lingular collapse is associated with a moderate left pleural effusion demonstrating areas of  loculation. No discernible pleural thickening or nodularity. No evidence for right pleural effusion. No focal airspace consolidation on the right. 8 mm right lower lobe pulmonary nodule is seen on image 112 of series 4. Upper Abdomen: Unremarkable Musculoskeletal: Bone windows reveal no worrisome lytic or sclerotic osseous lesions. IMPRESSION: 1. Lingular and left lower lobe collapse associated with moderate loculated left pleural effusion. Although no central obstructing mass lesion is evident, given unilateral character of disease, malignancy must be excluded. 2. Borderline mediastinal and bilateral hilar lymphadenopathy. 3. 8 mm right lower lobe pulmonary nodule. Attention on follow-up imaging recommended. Electronically Signed   By: Misty Stanley M.D.   On: 10/23/2016 17:25    Medications:  Prior to Admission:  Prescriptions Prior to Admission  Medication Sig Dispense Refill Last Dose  . benzonatate (TESSALON) 200 MG capsule Take 1 capsule (200 mg total) by mouth 3 (three) times daily as needed for cough. 45 capsule 0 10/22/2016 at Unknown time  . cetirizine (ZYRTEC) 10 MG tablet Take 10 mg by mouth daily.    10/22/2016 at Unknown time  . Cholecalciferol (VITAMIN D-3) 5000 UNITS TABS Take 5,000 Units by mouth daily.     10/22/2016 at Unknown time  . escitalopram (LEXAPRO) 20 MG tablet TAKE 1 TABLET (20 MG TOTAL) BY MOUTH DAILY. 30 tablet 2 10/22/2016 at Unknown time  . FLUZONE HIGH-DOSE 0.5 ML SUSY as directed.    10/16/2016 at Unknown time  . Multiple Vitamins-Minerals (CENTRUM SILVER PO) Take 1 capsule by mouth daily.   10/22/2016 at Unknown time  . nilotinib (TASIGNA) 150 MG capsule Take 2 capsules (300 mg total) by mouth every 12 (twelve) hours. 120  capsule 11 10/23/2016 at 1045a  . Probiotic Product (DIGESTIVE ADVANTAGE GUMMIES PO) Take 2 tablets by mouth daily.   10/22/2016 at Unknown time  . traMADol (ULTRAM) 50 MG tablet Take 1 tablet (50 mg total) by mouth every 6 (six) hours as needed. (Patient taking differently: Take 50 mg by mouth every 6 (six) hours as needed for moderate pain or severe pain. ) 90 tablet 0 10/23/2016 at Unknown time  . vitamin B-12 (CYANOCOBALAMIN) 1000 MCG tablet Take 5,000 mcg by mouth daily.    10/22/2016 at Unknown time  . ALPRAZolam (XANAX) 0.25 MG tablet Take 1 tablet (0.25 mg total) by mouth every 6 (six) hours as needed for anxiety. (Patient not taking: Reported on 06/11/2016) 30 tablet 1 unknown  . conjugated estrogens (PREMARIN) vaginal cream Place 1 Applicatorful vaginally 2 (two) times a week. 1/2 to 1g   over 30 days  . ondansetron (ZOFRAN) 8 MG tablet Take 1 tablet (8 mg total) by mouth every 8 (eight) hours as needed for nausea or vomiting. (Patient not taking: Reported on 06/11/2016) 30 tablet 2 unknown  . prochlorperazine (COMPAZINE) 10 MG tablet Take 1 tablet (10 mg total) by mouth every 6 (six) hours as needed for nausea or vomiting. (Patient not taking: Reported on 08/20/2016) 60 tablet 2 unknown  . promethazine (PHENERGAN) 25 MG tablet Take 1 tablet (25 mg total) by mouth every 6 (six) hours as needed for nausea or vomiting. (Patient not taking: Reported on 08/20/2016) 60 tablet 2 unknown   Scheduled: . enoxaparin (LOVENOX) injection  40 mg Subcutaneous Q24H  . escitalopram  20 mg Oral QHS   Continuous:  YDX:AJOINOMVEHMCN **OR** acetaminophen, ALPRAZolam, ondansetron **OR** ondansetron (ZOFRAN) IV, senna-docusate, traMADol  Assesment: She has pleural effusion, pericardial effusion, lobar collapse and a solitary pulmonary nodule. This is in the setting of baseline  CML. She is on chemotherapy for that and has done pretty well. I reviewed the CT personally and I do not think that thoracentesis which has  been scheduled will relieve her problem. She may get some symptomatic relief. I discussed her situation with Dr. Roxan Hockey from thoracic surgery and we both feel that she is going to need to have potential VATS procedure drainage of the pericardial effusion and potentially other procedures depending on what he finds. Principal Problem:   Pleural effusion Active Problems:   Chronic myelogenous leukemia (CML), BCR-ABL1-positive (HCC)   Solitary pulmonary nodule    Plan: Transfer to [facility] for surgery tomorrow    LOS: 1 day   Andrea Martinez L 10/24/2016, 9:44 AM

## 2016-10-24 NOTE — Progress Notes (Signed)
[facility] hospitalists update note  Patient transferred over from [facility] for evaluation by CT surgery. Patient currently complaining of chest discomfort rated as 8 out of 10. She denies symptoms of ACS such as diaphoresis or radiation of chest pain. IV dilated ordered when necessary 0.5 mg. Patient with negative troponin 1 so far. We'll get repeat. Patient otherwise appears well. Will prep patient for surgery in a.m.  Elwin Mocha MD  Addendum Mildly pos trop. EKG in AM. Trop bump likely due to strain from pleural effusion. Will give ASA. Family updated. Pt w/o significant heart hx. May need ECHO and cardio consult in AM for OR clearance.  Elwin Mocha MD

## 2016-10-24 NOTE — Progress Notes (Signed)
[facility] now transporting patient to [facility].

## 2016-10-24 NOTE — Progress Notes (Signed)
Called report to [facility] nurse, Compton Brigance, and called [facility] to transport patient.

## 2016-10-24 NOTE — Consult Note (Signed)
Reason for Consult:Left pleural and pericardial effusion Referring Physician: Dr. Verdis Prime Andrea Martinez is an 70 y.o. female.  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 Her past history is significant for ulcer, hypothyroidism and leukocytosis. She also has a history of severe nausea with general anesthesia. She presented to [facility] yesterday with a c/o a 3 day history of shortness of breath with minimal exertion and a feeling that her abdomen was bloated. A CXR showed a moderately large left pleural effusion. A CT chest showed a large complex left pleural effusion and a pericardial effusion.   She was transferred to [facility] for further management.  Past Medical History:  Diagnosis Date  . Chronic myelogenous leukemia (CML), BCR-ABL1-positive ([place]) 11/20/2015  . Gastric ulcer 2011   EGD, 5/11  . Hypothyroidism    not on meds, followed by Dr. Elyse Hsu  . Leukocytosis 11/20/2015  . Lipoma    left upper arm  . PONV (postoperative nausea and vomiting)   . UTI (lower urinary tract infection)    frequent    Past Surgical History:  Procedure Laterality Date  . BRAVO [place] STUDY  12/04/2012   Procedure: BRAVO [place];  Surgeon: [name], MD;  Location: [facility];  Service: Endoscopy;  Laterality: N/A;  . CHOLECYSTECTOMY N/A 01/03/2014   Procedure: LAPAROSCOPIC CHOLECYSTECTOMY WITH INTRAOPERATIVE CHOLANGIOGRAM;  Surgeon: Joyice Faster. Cornett, MD;  Location: [place];  Service: General;  Laterality: N/A;  . COLONOSCOPY  01/2004   DR [facility], few small tics  . ESOPHAGOGASTRODUODENOSCOPY  02/2010   gastric ulcers  . ESOPHAGOGASTRODUODENOSCOPY  12/04/2012   [id number] gastritis (inflammation) was found in the gastric antrum; multiple biopsies The duodenal mucosa showed no abnormalities in the bulb and second portion of the duodenum  . LIPOMA  EXCISION  08/02/2011   left shoulder  . NOSE SURGERY    . PARTIAL HYSTERECTOMY     vaginal at age 61 years of age  . TOE DEBRIDEMENT  1962   lt great toe    Family History  Problem Relation Age of Onset  . Lung cancer Father   . COPD Mother   . Colon cancer Neg Hx   . Anesthesia problems Neg Hx   . Hypotension Neg Hx   . Malignant hyperthermia Neg Hx   . Pseudochol deficiency Neg Hx     Social History:  reports that she has never smoked. She has never used smokeless tobacco. She reports that she does not drink alcohol or use drugs.  Allergies:  Allergies  Allergen Reactions  . Codeine Nausea Only    Headache  . Esomeprazole Magnesium Nausea Only  . Sulfonamide Derivatives Nausea Only    Headache  . Fluconazole Rash    Redness and blistering on left thigh    Medications:  Scheduled: . enoxaparin (LOVENOX) injection  40 mg Subcutaneous Q24H  . escitalopram  20 mg Oral QHS  . [START ON 10/25/2016] vancomycin  1,000 mg Intravenous On Call to OR    Results for orders placed or performed during the hospital encounter of 10/23/16 (from the past 48 hour(s))  CBC with Differential     Status: Abnormal   Collection Time: 10/23/16  2:50 PM  Result Value Ref Range   WBC 18.2 (H) 4.0 - 10.5 K/uL   RBC 4.18 3.87 - 5.11 MIL/uL  Hemoglobin 12.1 12.0 - 15.0 g/dL   HCT 36.8 36.0 - 46.0 %   MCV 88.0 78.0 - 100.0 fL   MCH 28.9 26.0 - 34.0 pg   MCHC 32.9 30.0 - 36.0 g/dL   RDW 13.6 11.5 - 15.5 %   Platelets 381 150 - 400 K/uL   Neutrophils Relative % 78 %   Neutro Abs 14.2 (H) 1.7 - 7.7 K/uL   Lymphocytes Relative 8 %   Lymphs Abs 1.5 0.7 - 4.0 K/uL   Monocytes Relative 14 %   Monocytes Absolute 2.5 (H) 0.1 - 1.0 K/uL   Eosinophils Relative 0 %   Eosinophils Absolute 0.0 0.0 - 0.7 K/uL   Basophils Relative 0 %   Basophils Absolute 0.0 0.0 - 0.1 K/uL  Comprehensive metabolic panel     Status: Abnormal   Collection Time: 10/23/16  2:50 PM  Result Value Ref Range   Sodium  133 (L) 135 - 145 mmol/L   Potassium 3.7 3.5 - 5.1 mmol/L   Chloride 98 (L) 101 - 111 mmol/L   CO2 25 22 - 32 mmol/L   Glucose, Bld 131 (H) 65 - 99 mg/dL   BUN 18 6 - 20 mg/dL   Creatinine, Ser 0.94 0.44 - 1.00 mg/dL   Calcium 8.7 (L) 8.9 - 10.3 mg/dL   Total Protein 8.0 6.5 - 8.1 g/dL   Albumin 3.6 3.5 - 5.0 g/dL   AST 33 15 - 41 U/L   ALT 21 14 - 54 U/L   Alkaline Phosphatase 154 (H) 38 - 126 U/L   Total Bilirubin 1.3 (H) 0.3 - 1.2 mg/dL   GFR calc non Af Amer 60 (L) >60 mL/min   GFR calc Af Amer >60 >60 mL/min    Comment: (NOTE) The eGFR has been calculated using the CKD EPI equation. This calculation has not been validated in all clinical situations. eGFR's persistently <60 mL/min signify possible Chronic Kidney Disease.    Anion gap 10 5 - 15  Troponin I     Status: None   Collection Time: 10/23/16  2:50 PM  Result Value Ref Range   Troponin I <0.03 <0.03 ng/mL  Brain natriuretic peptide     Status: None   Collection Time: 10/23/16  2:51 PM  Result Value Ref Range   B Natriuretic Peptide 68.0 0.0 - 100.0 pg/mL  Basic metabolic panel     Status: Abnormal   Collection Time: 10/24/16  6:20 AM  Result Value Ref Range   Sodium 136 135 - 145 mmol/L   Potassium 3.7 3.5 - 5.1 mmol/L   Chloride 101 101 - 111 mmol/L   CO2 26 22 - 32 mmol/L   Glucose, Bld 117 (H) 65 - 99 mg/dL   BUN 18 6 - 20 mg/dL   Creatinine, Ser 0.70 0.44 - 1.00 mg/dL   Calcium 8.5 (L) 8.9 - 10.3 mg/dL   GFR calc non Af Amer >60 >60 mL/min   GFR calc Af Amer >60 >60 mL/min    Comment: (NOTE) The eGFR has been calculated using the CKD EPI equation. This calculation has not been validated in all clinical situations. eGFR's persistently <60 mL/min signify possible Chronic Kidney Disease.    Anion gap 9 5 - 15  CBC     Status: Abnormal   Collection Time: 10/24/16  6:20 AM  Result Value Ref Range   WBC 12.5 (H) 4.0 - 10.5 K/uL   RBC 3.91 3.87 - 5.11 MIL/uL   Hemoglobin 11.0 (L)  12.0 - 15.0 g/dL   HCT  34.7 (L) 36.0 - 46.0 %   MCV 88.7 78.0 - 100.0 fL   MCH 28.1 26.0 - 34.0 pg   MCHC 31.7 30.0 - 36.0 g/dL   RDW 13.6 11.5 - 15.5 %   Platelets 344 150 - 400 K/uL    Dg Chest 2 View  Result Date: 10/23/2016 CLINICAL DATA:  Shortness of breath and chest pain. EXAM: CHEST  2 VIEW COMPARISON:  November 01, 2011 FINDINGS: No pneumothorax. Significant opacification of the left hemi thorax with only a small amount of apex aerated. On the lateral view, there appears to be a mass located superiorly measuring 4.8 cm. There is probably associated effusion. No other acute abnormalities. Mild atelectasis in the right base. IMPRESSION: 1. Significant opacification of left hemi thorax with an apparent 4.8 cm mass seen on the lateral view. Probable associated effusion. Recommend CT scan. Electronically Signed   By: Dorise Bullion III M.D   On: 10/23/2016 15:52   Ct Chest W Contrast  Result Date: 10/23/2016 CLINICAL DATA:  Left pleural effusion. Patient is very short of breath. EXAM: CT CHEST WITH CONTRAST TECHNIQUE: Multidetector CT imaging of the chest was performed during intravenous contrast administration. CONTRAST:  69m ISOVUE-300 IOPAMIDOL (ISOVUE-300) INJECTION 61% COMPARISON:  08/13/2009. FINDINGS: Cardiovascular: Heart size is normal. Small to moderate pericardial effusion noted. There is abdominal aortic atherosclerosis without aneurysm. No evidence for thoracic aortic dissection. Mediastinum/Nodes: 12 mm short axis subcarinal lymph node is mildly enlarged. 10 mm borderline enlarged short axis lymph node identified right hilum. 10 mm short axis lymph node identified left hilum. Lungs/Pleura: Left lower lobe and lingular collapse is associated with a moderate left pleural effusion demonstrating areas of loculation. No discernible pleural thickening or nodularity. No evidence for right pleural effusion. No focal airspace consolidation on the right. 8 mm right lower lobe pulmonary nodule is seen on image 112  of series 4. Upper Abdomen: Unremarkable Musculoskeletal: Bone windows reveal no worrisome lytic or sclerotic osseous lesions. IMPRESSION: 1. Lingular and left lower lobe collapse associated with moderate loculated left pleural effusion. Although no central obstructing mass lesion is evident, given unilateral character of disease, malignancy must be excluded. 2. Borderline mediastinal and bilateral hilar lymphadenopathy. 3. 8 mm right lower lobe pulmonary nodule. Attention on follow-up imaging recommended. Electronically Signed   By: EMisty StanleyM.D.   On: 10/23/2016 17:25    Review of Systems  Constitutional: Positive for malaise/fatigue. Negative for fever.  Respiratory: Positive for cough and shortness of breath.   Cardiovascular: Positive for orthopnea and leg swelling (chronic). Negative for chest pain.  Gastrointestinal:       Irritable bowel  Neurological: Positive for weakness.   Blood pressure (!) 144/72, pulse 96, temperature 99.3 F (37.4 C), temperature source Oral, resp. rate (!) 22, height '5\' 6"'  (1.676 m), weight 194 lb (88 kg), SpO2 97 %. Physical Exam  Vitals reviewed. Constitutional: She is oriented to person, place, and time. She appears distressed (mildly).  obese  HENT:  Head: Normocephalic and atraumatic.  Mouth/Throat: No oropharyngeal exudate.  Eyes: Conjunctivae and EOM are normal. Pupils are equal, round, and reactive to light. No scleral icterus.  Neck: No thyromegaly present.  Cardiovascular: Normal rate and regular rhythm.   Murmur (2/6 systolic) heard. Respiratory: She is in respiratory distress (mild iincreased WOB).  Absent BS left lower chest  GI: Soft. She exhibits distension (ascites). There is no tenderness.  Musculoskeletal: She exhibits edema (nonpittijng edema LE).  Lymphadenopathy:    She has no cervical adenopathy.  Neurological: She is alert and oriented to person, place, and time. No cranial nerve deficit.  No focal motor deficit  Skin: Skin  is warm and dry.    Assessment/Plan: 70 yo woman with CML who presents with a 3 day history of shortness of breath on the heels of a 2 week period of general malaise. She has been found to have a large complex left pleural effusion and pericardial effusion as well (small to moderate).   The differential diagnosis includes infectious, inflammatory and malignant etiologies. I suspect this is likely a para-pneumonic effusion, but cannot rule out other possibilities based on scans.  I agree with Dr. Luan Pulling that VATS to drain the effusion is the best option. I recommended to Andrea Martinez that we proceed with Bronchoscopy, left VATS, drainage of pleural effusion. We will also plan intraoperative TEE to see if the pericardial effusion warrants drainage. If so , we will take care of that as well. Will plan to do pericardial window via left VATS unless purulent pleural effusion in which case will do a subxiphoid pericardial window if needed.  I have discussed the general nature of the procedure, the need for general anesthesia, the intraoperative decision making, and the  incisions to be used with Andrea Martinez and her family. We discussed the expected hospital stay, overall recovery and short and long term outcomes. They understand the risks include, but are not limited to death, stroke, MI, DVT/PE, bleeding, possible need for transfusion, infections, air leaks, as well as other organ system dysfunction including respiratory, renal, or GI complications. She accepts the risks and agrees to proceed.  Plan Bronch, Left VATS, drainage of pleural effusion, possible subxiphoid pericardial window tomorrow  [name] 10/24/2016, 1:37 PM

## 2016-10-25 ENCOUNTER — Inpatient Hospital Stay (HOSPITAL_COMMUNITY): Payer: Medicare Other

## 2016-10-25 ENCOUNTER — Inpatient Hospital Stay (HOSPITAL_COMMUNITY): Payer: Medicare Other | Admitting: Anesthesiology

## 2016-10-25 ENCOUNTER — Encounter (HOSPITAL_COMMUNITY)
Admission: EM | Disposition: A | Discharge status: Home Health | Payer: Self-pay | Source: Home / Self Care | Attending: Internal Medicine

## 2016-10-25 ENCOUNTER — Other Ambulatory Visit (HOSPITAL_COMMUNITY): Payer: Medicare Other

## 2016-10-25 HISTORY — PX: VIDEO ASSISTED THORACOSCOPY (VATS)/EMPYEMA: SHX6172

## 2016-10-25 HISTORY — PX: VIDEO BRONCHOSCOPY: SHX5072

## 2016-10-25 LAB — BASIC METABOLIC PANEL
Anion gap: 10 (ref 5–15)
BUN: 12 mg/dL (ref 6–20)
CHLORIDE: 101 mmol/L (ref 101–111)
CO2: 25 mmol/L (ref 22–32)
CREATININE: 0.75 mg/dL (ref 0.44–1.00)
Calcium: 8.5 mg/dL — ABNORMAL LOW (ref 8.9–10.3)
GFR calc Af Amer: >60 mL/min (ref >60)
GLUCOSE: 117 mg/dL — AB (ref 65–99)
Potassium: 3.8 mmol/L (ref 3.5–5.1)
SODIUM: 136 mmol/L (ref 135–145)

## 2016-10-25 LAB — GRAM STAIN

## 2016-10-25 LAB — PROTIME-INR
INR: 1.17
PROTHROMBIN TIME: 14.9 s (ref 11.4–15.2)

## 2016-10-25 LAB — TROPONIN I: Troponin I: <0.03 ng/mL (ref <0.03)

## 2016-10-25 LAB — CBC
HCT: 33 % — ABNORMAL LOW (ref 36.0–46.0)
Hemoglobin: 10.3 g/dL — ABNORMAL LOW (ref 12.0–15.0)
MCH: 27.2 pg (ref 26.0–34.0)
MCHC: 31.2 g/dL (ref 30.0–36.0)
MCV: 87.1 fL (ref 78.0–100.0)
PLATELETS: 315 10*3/uL (ref 150–400)
RBC: 3.79 MIL/uL — ABNORMAL LOW (ref 3.87–5.11)
RDW: 13.8 % (ref 11.5–15.5)
WBC: 11.8 10*3/uL — ABNORMAL HIGH (ref 4.0–10.5)

## 2016-10-25 SURGERY — VIDEO ASSISTED THORACOSCOPY (VATS)/EMPYEMA
Anesthesia: General | Site: Chest

## 2016-10-25 MED ORDER — FENTANYL CITRATE (PF) 250 MCG/5ML IJ SOLN
INTRAMUSCULAR | Status: DC | PRN
  Administered 2016-10-25: 100 ug via INTRAVENOUS
  Administered 2016-10-25: 50 ug via INTRAVENOUS
  Administered 2016-10-25: 150 ug via INTRAVENOUS

## 2016-10-25 MED ORDER — PROPOFOL 10 MG/ML IV BOLUS
INTRAVENOUS | Status: DC | PRN
  Administered 2016-10-25: 150 mg via INTRAVENOUS

## 2016-10-25 MED ORDER — 0.9 % SODIUM CHLORIDE (POUR BTL) OPTIME
TOPICAL | Status: DC | PRN
  Administered 2016-10-25: 2000 mL

## 2016-10-25 MED ORDER — SUGAMMADEX SODIUM 200 MG/2ML IV SOLN
INTRAVENOUS | Status: DC | PRN
  Administered 2016-10-25: 200 mg via INTRAVENOUS

## 2016-10-25 MED ORDER — BISACODYL 5 MG PO TBEC
10.0000 mg | DELAYED_RELEASE_TABLET | Freq: Every day | ORAL | Status: DC
  Administered 2016-10-25 – 2016-10-27 (×3): 10 mg via ORAL
  Filled 2016-10-25 (×5): qty 2

## 2016-10-25 MED ORDER — PROMETHAZINE HCL 25 MG/ML IJ SOLN: 6.2500 mg | INTRAMUSCULAR | Status: DC | PRN

## 2016-10-25 MED ORDER — DIPHENHYDRAMINE HCL 50 MG/ML IJ SOLN: 12.5000 mg | Freq: Four times a day (QID) | INTRAMUSCULAR | Status: DC | PRN

## 2016-10-25 MED ORDER — ONDANSETRON HCL 4 MG/2ML IJ SOLN: 4.0000 mg | Freq: Four times a day (QID) | INTRAMUSCULAR | Status: DC | PRN

## 2016-10-25 MED ORDER — MEPERIDINE HCL 25 MG/ML IJ SOLN: 6.2500 mg | INTRAMUSCULAR | Status: DC | PRN

## 2016-10-25 MED ORDER — TRAMADOL HCL 50 MG PO TABS
50.0000 mg | ORAL_TABLET | Freq: Four times a day (QID) | ORAL | Status: DC | PRN
  Administered 2016-10-26 – 2016-10-30 (×9): 50 mg via ORAL
  Filled 2016-10-25 (×10): qty 1

## 2016-10-25 MED ORDER — PROPOFOL 10 MG/ML IV BOLUS
INTRAVENOUS | Status: AC
  Filled 2016-10-25: qty 20

## 2016-10-25 MED ORDER — MIDAZOLAM HCL 2 MG/2ML IJ SOLN
INTRAMUSCULAR | Status: AC
  Filled 2016-10-25: qty 2

## 2016-10-25 MED ORDER — FENTANYL 40 MCG/ML IV SOLN
INTRAVENOUS | Status: AC
  Filled 2016-10-25: qty 25

## 2016-10-25 MED ORDER — FENTANYL 40 MCG/ML IV SOLN
INTRAVENOUS | Status: AC
  Administered 2016-10-25: 50 ug via INTRAVENOUS
  Administered 2016-10-25: 16:00:00 via INTRAVENOUS
  Administered 2016-10-26: 20 ug via INTRAVENOUS
  Administered 2016-10-26: 150 ug via INTRAVENOUS
  Administered 2016-10-26: 260 ug via INTRAVENOUS
  Administered 2016-10-26: 160 ug via INTRAVENOUS
  Administered 2016-10-26: 130 ug via INTRAVENOUS
  Administered 2016-10-26: 150 ug via INTRAVENOUS
  Administered 2016-10-27: 70 ug via INTRAVENOUS
  Administered 2016-10-27: 170 ug via INTRAVENOUS
  Administered 2016-10-27: 205 ug via INTRAVENOUS
  Administered 2016-10-27: 170 ug via INTRAVENOUS
  Administered 2016-10-27: 110 ug via INTRAVENOUS
  Administered 2016-10-28: 50 ug via INTRAVENOUS
  Administered 2016-10-28: 30 ug via INTRAVENOUS
  Administered 2016-10-28: 80 ug via INTRAVENOUS
  Filled 2016-10-25 (×2): qty 25

## 2016-10-25 MED ORDER — HYDROMORPHONE HCL 1 MG/ML IJ SOLN
0.2500 mg | INTRAMUSCULAR | Status: DC | PRN
  Administered 2016-10-25 (×4): 0.5 mg via INTRAVENOUS

## 2016-10-25 MED ORDER — HYDROMORPHONE HCL 2 MG/ML IJ SOLN
INTRAMUSCULAR | Status: AC
  Filled 2016-10-25: qty 1

## 2016-10-25 MED ORDER — ONDANSETRON HCL 4 MG/2ML IJ SOLN
INTRAMUSCULAR | Status: DC | PRN
  Administered 2016-10-25: 4 mg via INTRAVENOUS

## 2016-10-25 MED ORDER — DIPHENHYDRAMINE HCL 12.5 MG/5ML PO ELIX: 12.5000 mg | ORAL_SOLUTION | Freq: Four times a day (QID) | ORAL | Status: DC | PRN

## 2016-10-25 MED ORDER — FENTANYL CITRATE (PF) 250 MCG/5ML IJ SOLN
INTRAMUSCULAR | Status: AC
  Filled 2016-10-25: qty 5

## 2016-10-25 MED ORDER — SENNOSIDES-DOCUSATE SODIUM 8.6-50 MG PO TABS
1.0000 | ORAL_TABLET | Freq: Every day | ORAL | Status: DC
  Administered 2016-10-25 – 2016-10-26 (×2): 1 via ORAL
  Filled 2016-10-25 (×5): qty 1

## 2016-10-25 MED ORDER — DEXAMETHASONE SODIUM PHOSPHATE 10 MG/ML IJ SOLN
INTRAMUSCULAR | Status: DC | PRN
  Administered 2016-10-25: 10 mg via INTRAVENOUS

## 2016-10-25 MED ORDER — ENOXAPARIN SODIUM 40 MG/0.4ML ~~LOC~~ SOLN
40.0000 mg | SUBCUTANEOUS | Status: DC
  Administered 2016-10-26 – 2016-11-03 (×9): 40 mg via SUBCUTANEOUS
  Filled 2016-10-25 (×9): qty 0.4

## 2016-10-25 MED ORDER — INSULIN ASPART 100 UNIT/ML ~~LOC~~ SOLN
0.0000 [IU] | Freq: Four times a day (QID) | SUBCUTANEOUS | Status: DC
  Administered 2016-10-26 (×2): 2 [IU] via SUBCUTANEOUS

## 2016-10-25 MED ORDER — HYDROMORPHONE HCL 1 MG/ML IJ SOLN: 0.2500 mg | INTRAMUSCULAR | Status: DC | PRN

## 2016-10-25 MED ORDER — ACETAMINOPHEN 500 MG PO TABS
1000.0000 mg | ORAL_TABLET | Freq: Four times a day (QID) | ORAL | Status: AC
  Administered 2016-10-25 – 2016-10-30 (×15): 1000 mg via ORAL
  Filled 2016-10-25 (×17): qty 2

## 2016-10-25 MED ORDER — SODIUM CHLORIDE 0.9% FLUSH: 9.0000 mL | INTRAVENOUS | Status: DC | PRN

## 2016-10-25 MED ORDER — MIDAZOLAM HCL 2 MG/2ML IJ SOLN: 0.5000 mg | Freq: Once | INTRAMUSCULAR | Status: DC | PRN

## 2016-10-25 MED ORDER — CEFUROXIME SODIUM 1.5 G IJ SOLR
1.5000 g | Freq: Two times a day (BID) | INTRAMUSCULAR | Status: AC
  Administered 2016-10-25 – 2016-10-26 (×2): 1.5 g via INTRAVENOUS
  Filled 2016-10-25 (×2): qty 1.5

## 2016-10-25 MED ORDER — ALPRAZOLAM 0.25 MG PO TABS
0.2500 mg | ORAL_TABLET | Freq: Three times a day (TID) | ORAL | Status: DC | PRN
  Administered 2016-10-30: 0.25 mg via ORAL
  Filled 2016-10-25: qty 1

## 2016-10-25 MED ORDER — MIDAZOLAM HCL 5 MG/5ML IJ SOLN
INTRAMUSCULAR | Status: DC | PRN
  Administered 2016-10-25: 2 mg via INTRAVENOUS

## 2016-10-25 MED ORDER — LACTATED RINGERS IV SOLN
INTRAVENOUS | Status: DC | PRN
  Administered 2016-10-25 (×3): via INTRAVENOUS

## 2016-10-25 MED ORDER — ACETAMINOPHEN 160 MG/5ML PO SOLN: 1000.0000 mg | Freq: Four times a day (QID) | ORAL | Status: AC

## 2016-10-25 MED ORDER — SCOPOLAMINE 1 MG/3DAYS TD PT72
MEDICATED_PATCH | TRANSDERMAL | Status: DC | PRN
  Administered 2016-10-25: 1 via TRANSDERMAL

## 2016-10-25 MED ORDER — SCOPOLAMINE 1 MG/3DAYS TD PT72
MEDICATED_PATCH | TRANSDERMAL | Status: AC
  Filled 2016-10-25: qty 1

## 2016-10-25 MED ORDER — ROCURONIUM BROMIDE 100 MG/10ML IV SOLN
INTRAVENOUS | Status: DC | PRN
  Administered 2016-10-25: 20 mg via INTRAVENOUS
  Administered 2016-10-25: 50 mg via INTRAVENOUS

## 2016-10-25 MED ORDER — KCL IN DEXTROSE-NACL 20-5-0.9 MEQ/L-%-% IV SOLN
INTRAVENOUS | Status: DC
  Administered 2016-10-25: 20:00:00 via INTRAVENOUS
  Filled 2016-10-25 (×2): qty 1000

## 2016-10-25 MED ORDER — NALOXONE HCL 0.4 MG/ML IJ SOLN: 0.4000 mg | INTRAMUSCULAR | Status: DC | PRN

## 2016-10-25 MED ORDER — LACTATED RINGERS IV SOLN: INTRAVENOUS | Status: DC

## 2016-10-25 SURGICAL SUPPLY — 77 items
ADH SKN CLS APL DERMABOND .7 (GAUZE/BANDAGES/DRESSINGS) ×4
CANISTER SUCTION 2500CC (MISCELLANEOUS) ×12 IMPLANT
CATH KIT ON Q 5IN SLV (PAIN MANAGEMENT) IMPLANT
CATH THORACIC 28FR (CATHETERS) ×4 IMPLANT
CATH THORACIC 36FR (CATHETERS) IMPLANT
CATH THORACIC 36FR RT ANG (CATHETERS) IMPLANT
CLIP TI MEDIUM 6 (CLIP) ×4 IMPLANT
CONN ST 1/4X3/8  BEN (MISCELLANEOUS)
CONN ST 1/4X3/8 BEN (MISCELLANEOUS) IMPLANT
CONN Y 3/8X3/8X3/8  BEN (MISCELLANEOUS)
CONN Y 3/8X3/8X3/8 BEN (MISCELLANEOUS) IMPLANT
CONT SPEC 4OZ CLIKSEAL STRL BL (MISCELLANEOUS) ×12 IMPLANT
DERMABOND ADVANCED (GAUZE/BANDAGES/DRESSINGS) ×4
DERMABOND ADVANCED .7 DNX12 (GAUZE/BANDAGES/DRESSINGS) ×4 IMPLANT
DRAIN CHANNEL 19F RND (DRAIN) ×4 IMPLANT
DRAIN CHANNEL 28F RND 3/8 FF (WOUND CARE) ×4 IMPLANT
DRAIN CHANNEL 32F RND 10.7 FF (WOUND CARE) IMPLANT
DRAPE LAPAROSCOPIC ABDOMINAL (DRAPES) ×8 IMPLANT
DRAPE WARM FLUID 44X44 (DRAPE) ×4 IMPLANT
ELECT BLADE 6.5 EXT (BLADE) ×4 IMPLANT
ELECT REM PT RETURN 9FT ADLT (ELECTROSURGICAL) ×4
ELECTRODE REM PT RTRN 9FT ADLT (ELECTROSURGICAL) ×2 IMPLANT
GAUZE SPONGE 4X4 12PLY STRL (GAUZE/BANDAGES/DRESSINGS) ×4 IMPLANT
GLOVE SURG SIGNA 7.5 PF LTX (GLOVE) ×8 IMPLANT
GOWN STRL REUS W/ TWL LRG LVL3 (GOWN DISPOSABLE) ×6 IMPLANT
GOWN STRL REUS W/ TWL XL LVL3 (GOWN DISPOSABLE) ×4 IMPLANT
GOWN STRL REUS W/TWL LRG LVL3 (GOWN DISPOSABLE) ×12
GOWN STRL REUS W/TWL XL LVL3 (GOWN DISPOSABLE) ×8
KIT BASIN OR (CUSTOM PROCEDURE TRAY) ×4 IMPLANT
KIT ROOM TURNOVER OR (KITS) ×4 IMPLANT
KIT SUCTION CATH 14FR (SUCTIONS) ×4 IMPLANT
NS IRRIG 1000ML POUR BTL (IV SOLUTION) ×8 IMPLANT
PACK CHEST (CUSTOM PROCEDURE TRAY) ×4 IMPLANT
PAD ARMBOARD 7.5X6 YLW CONV (MISCELLANEOUS) ×8 IMPLANT
POUCH ENDO CATCH II 15MM (MISCELLANEOUS) IMPLANT
POUCH SPECIMEN RETRIEVAL 10MM (ENDOMECHANICALS) IMPLANT
SEALANT PROGEL (MISCELLANEOUS) IMPLANT
SEALANT SURG COSEAL 4ML (VASCULAR PRODUCTS) IMPLANT
SEALANT SURG COSEAL 8ML (VASCULAR PRODUCTS) IMPLANT
SOLUTION ANTI FOG 6CC (MISCELLANEOUS) ×8 IMPLANT
SPECIMEN JAR MEDIUM (MISCELLANEOUS) ×4 IMPLANT
SPONGE GAUZE 4X4 12PLY STER LF (GAUZE/BANDAGES/DRESSINGS) ×4 IMPLANT
SPONGE INTESTINAL PEANUT (DISPOSABLE) ×4 IMPLANT
SPONGE TONSIL 1 RF SGL (DISPOSABLE) ×8 IMPLANT
SUT PROLENE 4 0 RB 1 (SUTURE)
SUT PROLENE 4-0 RB1 .5 CRCL 36 (SUTURE) IMPLANT
SUT SILK  1 MH (SUTURE) ×6
SUT SILK 1 MH (SUTURE) ×6 IMPLANT
SUT SILK 1 TIES 10X30 (SUTURE) ×4 IMPLANT
SUT SILK 2 0SH CR/8 30 (SUTURE) IMPLANT
SUT SILK 3 0SH CR/8 30 (SUTURE) IMPLANT
SUT VIC AB 0 CTX 27 (SUTURE) IMPLANT
SUT VIC AB 1 CTX 27 (SUTURE) IMPLANT
SUT VIC AB 2-0 CT1 27 (SUTURE)
SUT VIC AB 2-0 CT1 TAPERPNT 27 (SUTURE) IMPLANT
SUT VIC AB 2-0 CTX 36 (SUTURE) IMPLANT
SUT VIC AB 3-0 MH 27 (SUTURE) IMPLANT
SUT VIC AB 3-0 SH 27 (SUTURE)
SUT VIC AB 3-0 SH 27X BRD (SUTURE) IMPLANT
SUT VIC AB 3-0 X1 27 (SUTURE) ×8 IMPLANT
SUT VICRYL 0 UR6 27IN ABS (SUTURE) ×8 IMPLANT
SUT VICRYL 2 TP 1 (SUTURE) IMPLANT
SWAB COLLECTION DEVICE MRSA (MISCELLANEOUS) IMPLANT
SYRINGE 10CC LL (SYRINGE) IMPLANT
SYSTEM SAHARA CHEST DRAIN ATS (WOUND CARE) ×4 IMPLANT
TAPE CLOTH SURG 4X10 WHT LF (GAUZE/BANDAGES/DRESSINGS) ×4 IMPLANT
TIP APPLICATOR SPRAY EXTEND 16 (VASCULAR PRODUCTS) IMPLANT
TOWEL OR 17X24 6PK STRL BLUE (TOWEL DISPOSABLE) ×4 IMPLANT
TOWEL OR 17X26 10 PK STRL BLUE (TOWEL DISPOSABLE) ×8 IMPLANT
TRAP SPECIMEN MUCOUS 40CC (MISCELLANEOUS) ×20 IMPLANT
TRAY FOLEY CATH 16FRSI W/METER (SET/KITS/TRAYS/PACK) ×4 IMPLANT
TROCAR XCEL BLADELESS 5X75MML (TROCAR) ×4 IMPLANT
TUBE ANAEROBIC SPECIMEN COL (MISCELLANEOUS) IMPLANT
TUBE CONNECTING 12'X1/4 (SUCTIONS) ×1
TUBE CONNECTING 12X1/4 (SUCTIONS) ×3 IMPLANT
TUNNELER SHEATH ON-Q 11GX8 DSP (PAIN MANAGEMENT) IMPLANT
WATER STERILE IRR 1000ML POUR (IV SOLUTION) ×4 IMPLANT

## 2016-10-25 NOTE — Anesthesia Procedure Notes (Deleted)
Anesthesia Procedure Image    

## 2016-10-25 NOTE — H&P (View-Only) (Signed)
Reason for Consult:Left pleural and pericardial effusion Referring Physician: Dr. Verdis Prime Andrea Martinez is an 70 y.o. female.  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 Her past history is significant for ulcer, hypothyroidism and leukocytosis. She also has a history of severe nausea with general anesthesia. She presented to [facility] yesterday with a c/o a 3 day history of shortness of breath with minimal exertion and a feeling that her abdomen was bloated. A CXR showed a moderately large left pleural effusion. A CT chest showed a large complex left pleural effusion and a pericardial effusion.   She was transferred to [facility] for further management.  Past Medical History:  Diagnosis Date  . Chronic myelogenous leukemia (CML), BCR-ABL1-positive ([place]) 11/20/2015  . Gastric ulcer 2011   EGD, 5/11  . Hypothyroidism    not on meds, followed by Dr. Elyse Hsu  . Leukocytosis 11/20/2015  . Lipoma    left upper arm  . PONV (postoperative nausea and vomiting)   . UTI (lower urinary tract infection)    frequent    Past Surgical History:  Procedure Laterality Date  . BRAVO [place] STUDY  12/04/2012   Procedure: BRAVO [place];  Surgeon: [name], MD;  Location: [facility] ENDO SUITE;  Service: Endoscopy;  Laterality: N/A;  . CHOLECYSTECTOMY N/A 01/03/2014   Procedure: LAPAROSCOPIC CHOLECYSTECTOMY WITH INTRAOPERATIVE CHOLANGIOGRAM;  Surgeon: Joyice Faster. Cornett, MD;  Location: [place];  Service: General;  Laterality: N/A;  . COLONOSCOPY  01/2004   DR [facility], few small tics  . ESOPHAGOGASTRODUODENOSCOPY  02/2010   gastric ulcers  . ESOPHAGOGASTRODUODENOSCOPY  12/04/2012   FFM:[id number] gastritis (inflammation) was found in the gastric antrum; multiple biopsies The duodenal mucosa showed no abnormalities in the bulb and second portion of the duodenum  . LIPOMA  EXCISION  08/02/2011   left shoulder  . NOSE SURGERY    . PARTIAL HYSTERECTOMY     vaginal at age 56 years of age  . TOE DEBRIDEMENT  1962   lt great toe    Family History  Problem Relation Age of Onset  . Lung cancer Father   . COPD Mother   . Colon cancer Neg Hx   . Anesthesia problems Neg Hx   . Hypotension Neg Hx   . Malignant hyperthermia Neg Hx   . Pseudochol deficiency Neg Hx     Social History:  reports that she has never smoked. She has never used smokeless tobacco. She reports that she does not drink alcohol or use drugs.  Allergies:  Allergies  Allergen Reactions  . Codeine Nausea Only    Headache  . Esomeprazole Magnesium Nausea Only  . Sulfonamide Derivatives Nausea Only    Headache  . Fluconazole Rash    Redness and blistering on left thigh    Medications:  Scheduled: . enoxaparin (LOVENOX) injection  40 mg Subcutaneous Q24H  . escitalopram  20 mg Oral QHS  . [START ON 10/25/2016] vancomycin  1,000 mg Intravenous On Call to OR    Results for orders placed or performed during the hospital encounter of 10/23/16 (from the past 48 hour(s))  CBC with Differential     Status: Abnormal   Collection Time: 10/23/16  2:50 PM  Result Value Ref Range   WBC 18.2 (H) 4.0 - 10.5 K/uL   RBC 4.18 3.87 - 5.11 MIL/uL  Hemoglobin 12.1 12.0 - 15.0 g/dL   HCT 36.8 36.0 - 46.0 %   MCV 88.0 78.0 - 100.0 fL   MCH 28.9 26.0 - 34.0 pg   MCHC 32.9 30.0 - 36.0 g/dL   RDW 13.6 11.5 - 15.5 %   Platelets 381 150 - 400 K/uL   Neutrophils Relative % 78 %   Neutro Abs 14.2 (H) 1.7 - 7.7 K/uL   Lymphocytes Relative 8 %   Lymphs Abs 1.5 0.7 - 4.0 K/uL   Monocytes Relative 14 %   Monocytes Absolute 2.5 (H) 0.1 - 1.0 K/uL   Eosinophils Relative 0 %   Eosinophils Absolute 0.0 0.0 - 0.7 K/uL   Basophils Relative 0 %   Basophils Absolute 0.0 0.0 - 0.1 K/uL  Comprehensive metabolic panel     Status: Abnormal   Collection Time: 10/23/16  2:50 PM  Result Value Ref Range   Sodium  133 (L) 135 - 145 mmol/L   Potassium 3.7 3.5 - 5.1 mmol/L   Chloride 98 (L) 101 - 111 mmol/L   CO2 25 22 - 32 mmol/L   Glucose, Bld 131 (H) 65 - 99 mg/dL   BUN 18 6 - 20 mg/dL   Creatinine, Ser 0.94 0.44 - 1.00 mg/dL   Calcium 8.7 (L) 8.9 - 10.3 mg/dL   Total Protein 8.0 6.5 - 8.1 g/dL   Albumin 3.6 3.5 - 5.0 g/dL   AST 33 15 - 41 U/L   ALT 21 14 - 54 U/L   Alkaline Phosphatase 154 (H) 38 - 126 U/L   Total Bilirubin 1.3 (H) 0.3 - 1.2 mg/dL   GFR calc non Af Amer 60 (L) >60 mL/min   GFR calc Af Amer >60 >60 mL/min    Comment: (NOTE) The eGFR has been calculated using the CKD EPI equation. This calculation has not been validated in all clinical situations. eGFR's persistently <60 mL/min signify possible Chronic Kidney Disease.    Anion gap 10 5 - 15  Troponin I     Status: None   Collection Time: 10/23/16  2:50 PM  Result Value Ref Range   Troponin I <0.03 <0.03 ng/mL  Brain natriuretic peptide     Status: None   Collection Time: 10/23/16  2:51 PM  Result Value Ref Range   B Natriuretic Peptide 68.0 0.0 - 100.0 pg/mL  Basic metabolic panel     Status: Abnormal   Collection Time: 10/24/16  6:20 AM  Result Value Ref Range   Sodium 136 135 - 145 mmol/L   Potassium 3.7 3.5 - 5.1 mmol/L   Chloride 101 101 - 111 mmol/L   CO2 26 22 - 32 mmol/L   Glucose, Bld 117 (H) 65 - 99 mg/dL   BUN 18 6 - 20 mg/dL   Creatinine, Ser 0.70 0.44 - 1.00 mg/dL   Calcium 8.5 (L) 8.9 - 10.3 mg/dL   GFR calc non Af Amer >60 >60 mL/min   GFR calc Af Amer >60 >60 mL/min    Comment: (NOTE) The eGFR has been calculated using the CKD EPI equation. This calculation has not been validated in all clinical situations. eGFR's persistently <60 mL/min signify possible Chronic Kidney Disease.    Anion gap 9 5 - 15  CBC     Status: Abnormal   Collection Time: 10/24/16  6:20 AM  Result Value Ref Range   WBC 12.5 (H) 4.0 - 10.5 K/uL   RBC 3.91 3.87 - 5.11 MIL/uL   Hemoglobin 11.0 (L)  12.0 - 15.0 g/dL   HCT  34.7 (L) 36.0 - 46.0 %   MCV 88.7 78.0 - 100.0 fL   MCH 28.1 26.0 - 34.0 pg   MCHC 31.7 30.0 - 36.0 g/dL   RDW 13.6 11.5 - 15.5 %   Platelets 344 150 - 400 K/uL    Dg Chest 2 View  Result Date: 10/23/2016 CLINICAL DATA:  Shortness of breath and chest pain. EXAM: CHEST  2 VIEW COMPARISON:  November 01, 2011 FINDINGS: No pneumothorax. Significant opacification of the left hemi thorax with only a small amount of apex aerated. On the lateral view, there appears to be a mass located superiorly measuring 4.8 cm. There is probably associated effusion. No other acute abnormalities. Mild atelectasis in the right base. IMPRESSION: 1. Significant opacification of left hemi thorax with an apparent 4.8 cm mass seen on the lateral view. Probable associated effusion. Recommend CT scan. Electronically Signed   By: Dorise Bullion III M.D   On: 10/23/2016 15:52   Ct Chest W Contrast  Result Date: 10/23/2016 CLINICAL DATA:  Left pleural effusion. Patient is very short of breath. EXAM: CT CHEST WITH CONTRAST TECHNIQUE: Multidetector CT imaging of the chest was performed during intravenous contrast administration. CONTRAST:  64m ISOVUE-300 IOPAMIDOL (ISOVUE-300) INJECTION 61% COMPARISON:  08/13/2009. FINDINGS: Cardiovascular: Heart size is normal. Small to moderate pericardial effusion noted. There is abdominal aortic atherosclerosis without aneurysm. No evidence for thoracic aortic dissection. Mediastinum/Nodes: 12 mm short axis subcarinal lymph node is mildly enlarged. 10 mm borderline enlarged short axis lymph node identified right hilum. 10 mm short axis lymph node identified left hilum. Lungs/Pleura: Left lower lobe and lingular collapse is associated with a moderate left pleural effusion demonstrating areas of loculation. No discernible pleural thickening or nodularity. No evidence for right pleural effusion. No focal airspace consolidation on the right. 8 mm right lower lobe pulmonary nodule is seen on image 112  of series 4. Upper Abdomen: Unremarkable Musculoskeletal: Bone windows reveal no worrisome lytic or sclerotic osseous lesions. IMPRESSION: 1. Lingular and left lower lobe collapse associated with moderate loculated left pleural effusion. Although no central obstructing mass lesion is evident, given unilateral character of disease, malignancy must be excluded. 2. Borderline mediastinal and bilateral hilar lymphadenopathy. 3. 8 mm right lower lobe pulmonary nodule. Attention on follow-up imaging recommended. Electronically Signed   By: EMisty StanleyM.D.   On: 10/23/2016 17:25    Review of Systems  Constitutional: Positive for malaise/fatigue. Negative for fever.  Respiratory: Positive for cough and shortness of breath.   Cardiovascular: Positive for orthopnea and leg swelling (chronic). Negative for chest pain.  Gastrointestinal:       Irritable bowel  Neurological: Positive for weakness.   Blood pressure (!) 144/72, pulse 96, temperature 99.3 F (37.4 C), temperature source Oral, resp. rate (!) 22, height '5\' 6"'  (1.676 m), weight 194 lb (88 kg), SpO2 97 %. Physical Exam  Vitals reviewed. Constitutional: She is oriented to person, place, and time. She appears distressed (mildly).  obese  HENT:  Head: Normocephalic and atraumatic.  Mouth/Throat: No oropharyngeal exudate.  Eyes: Conjunctivae and EOM are normal. Pupils are equal, round, and reactive to light. No scleral icterus.  Neck: No thyromegaly present.  Cardiovascular: Normal rate and regular rhythm.   Murmur (2/6 systolic) heard. Respiratory: She is in respiratory distress (mild iincreased WOB).  Absent BS left lower chest  GI: Soft. She exhibits distension (ascites). There is no tenderness.  Musculoskeletal: She exhibits edema (nonpittijng edema LE).  Lymphadenopathy:    She has no cervical adenopathy.  Neurological: She is alert and oriented to person, place, and time. No cranial nerve deficit.  No focal motor deficit  Skin: Skin  is warm and dry.    Assessment/Plan: 70 yo woman with CML who presents with a 3 day history of shortness of breath on the heels of a 2 week period of general malaise. She has been found to have a large complex left pleural effusion and pericardial effusion as well (small to moderate).   The differential diagnosis includes infectious, inflammatory and malignant etiologies. I suspect this is likely a para-pneumonic effusion, but cannot rule out other possibilities based on scans.  I agree with Dr. Luan Pulling that VATS to drain the effusion is the best option. I recommended to Mrs. Wiegert that we proceed with Bronchoscopy, left VATS, drainage of pleural effusion. We will also plan intraoperative TEE to see if the pericardial effusion warrants drainage. If so , we will take care of that as well. Will plan to do pericardial window via left VATS unless purulent pleural effusion in which case will do a subxiphoid pericardial window if needed.  I have discussed the general nature of the procedure, the need for general anesthesia, the intraoperative decision making, and the  incisions to be used with Mrs. Hickle and her family. We discussed the expected hospital stay, overall recovery and short and long term outcomes. They understand the risks include, but are not limited to death, stroke, MI, DVT/PE, bleeding, possible need for transfusion, infections, air leaks, as well as other organ system dysfunction including respiratory, renal, or GI complications. She accepts the risks and agrees to proceed.  Plan Bronch, Left VATS, drainage of pleural effusion, possible subxiphoid pericardial window tomorrow  [name] 10/24/2016, 1:37 PM

## 2016-10-25 NOTE — Consult Note (Signed)
            [facility] CM Primary Care Navigator  10/25/2016  Andrea Martinez October 02, 1946 [id number]    Went in to see patient at the bedside to identify possible discharge needs but staff states that patient is in surgery at this time (for bronchoscopy and thoracoscopy).   Will attempt to follow-up with patient at another time.    For additional questions please contact:  Edwena Felty A. Jaxson Anglin, BSN, RN-BC [facility] PRIMARY CARE Navigator Cell: [phone]

## 2016-10-25 NOTE — Anesthesia Procedure Notes (Signed)
Anesthesia Procedure Note Sterile prep, drape L wrist.  1% Lido local.  #20ga AC L radial artery. Biopatch and sterile dressing on.  VSS, pt tolerated well   Jenita Seashore, MD

## 2016-10-25 NOTE — Anesthesia Procedure Notes (Addendum)
Central Venous Catheter Insertion Performed by: anesthesiologist 10/25/2016 11:50 AM Patient location: Pre-op. Preanesthetic checklist: patient identified, IV checked, site marked, risks and benefits discussed, surgical consent, monitors and equipment checked, pre-op evaluation, timeout performed and anesthesia consent Position: supine Lidocaine 1% used for infiltration Landmarks identified and Seldinger technique used Catheter size: 8 Fr Central line was placed.Double lumen Procedure performed using ultrasound guided technique. Attempts: 1 Following insertion, line sutured, dressing applied and Biopatch. Post procedure assessment: blood return through all ports, free fluid flow and no air. Patient tolerated the procedure well with no immediate complications. Additional procedure comments: CVP: Timeout, sterile prep, drape, FBP L neck.  Supine position.  1% lido local, finder and trocar LIJ 1st pass with US guidance.  2 lumen placed over J wire. Biopatch and sterile dressing on.  Patient tolerated well.  VSS.  Jenita Seashore, MD.

## 2016-10-25 NOTE — Anesthesia Preprocedure Evaluation (Addendum)
Anesthesia Evaluation   Patient awake    Reviewed: Allergy & Precautions, NPO status , Patient's Chart, lab work & pertinent test results  History of Anesthesia Complications (+) PONV and history of anesthetic complications  Airway Mallampati: II  TM Distance: >3 FB Neck ROM: Full    Dental  (+) Dental Advisory Given   Pulmonary  L lung mass, with L pleural effusion   breath sounds clear to auscultation + decreased breath sounds (decreased on L)      Cardiovascular (-) hypertension Rhythm:Regular Rate:Normal  '16 ECHO; EF 60-65%, valves oK   Neuro/Psych negative neurological ROS     GI/Hepatic Neg liver ROS, GERD  Medicated and Controlled,  Endo/Other  Morbid obesity  Renal/GU negative Renal ROS     Musculoskeletal   Abdominal (+) + obese,   Peds  Hematology  (+) Blood dyscrasia (CML), ,   Anesthesia Other Findings   Reproductive/Obstetrics                            Anesthesia Physical Anesthesia Plan  ASA: III  Anesthesia Plan: General   Post-op Pain Management:    Induction: Intravenous  Airway Management Planned: Oral ETT and Double Lumen EBT  Additional Equipment: Arterial line, CVP and Ultrasound Guidance Line Placement  Intra-op Plan:   Post-operative Plan: Possible Post-op intubation/ventilation  Informed Consent: I have reviewed the patients History and Physical, chart, labs and discussed the procedure including the risks, benefits and alternatives for the proposed anesthesia with the patient or authorized representative who has indicated his/her understanding and acceptance.   Dental advisory given  Plan Discussed with: CRNA and Surgeon  Anesthesia Plan Comments: (Plan routine monitors, A line, Central access, GETA )        Anesthesia Quick Evaluation

## 2016-10-25 NOTE — Anesthesia Procedure Notes (Signed)
Procedure Name: Intubation Date/Time: 10/25/2016 12:52 PM Performed by: Mariea Clonts Pre-anesthesia Checklist: Patient identified, Emergency Drugs available, Suction available, Patient being monitored and Timeout performed Patient Re-evaluated:Patient Re-evaluated prior to inductionOxygen Delivery Method: Circle system utilized Preoxygenation: Pre-oxygenation with 100% oxygen Laryngoscope Size: Mac and 3 Grade View: Grade I Tube type: Oral Endobronchial tube: Left, Double lumen EBT, EBT position confirmed by auscultation and EBT position confirmed by fiberoptic bronchoscope and 37 Fr Number of attempts: 1 Airway Equipment and Method: Stylet Placement Confirmation: ETT inserted through vocal cords under direct vision,  positive ETCO2 and breath sounds checked- equal and bilateral Secured at: 29 cm Tube secured with: Tape Dental Injury: Teeth and Oropharynx as per pre-operative assessment

## 2016-10-25 NOTE — Progress Notes (Signed)
PROGRESS NOTE    Andrea Martinez  [id number] DOB: 18-Jul-1946 DOA: 10/23/2016 PCP: [name], MD   Brief Narrative: Andrea Martinez is a 70 y.o. female with a history of CML on chemotherapy, hypothyroidism, leukocytosis. She presents with 3 days of worsening shortness of breath acutely worse today. She is dyspneic on exertion to 10 of 15 feet. Improves at rest and on oxygen. Patient even has dyspnea with getting out of bed. Due to worsening symptoms, the patient presents to the hospital for evaluation  Emergency department course: Chest x-ray of the chest shows pleural effusion to the mid lung field. CT shows collapse of left lower lobe and left lingular lobe with 6 mm right lower lobe nodule. Patient satting well on nasal cannula    Assessment & Plan:   Principal Problem:   Pleural effusion Active Problems:   Chronic myelogenous leukemia (CML), BCR-ABL1-positive ([facility])   Solitary pulmonary nodule   1-Left Pleural effusion, pericardial effusion.  CVTS consulted.  Underwent Video-bronchoscopy left, drainage left pleural effusion, drainage of pericardial fluid  Window. S/P biopsy of pleural, pericardium 11-13.   2-Chest pain; chest pain free. Troponin negative. ECHO pending.  3-Hypothyroidism; Not on medications.   4-Leukocytosis; UA with too numerous to count WBC. Urine culture ordered.  Trending down.   CML;Was On Chemo  Hold chemotherapy per Dr. Whitney Muse Solitary Pulmonary Nodule;     DVT prophylaxis: Lovenox.  Code Status: Full code.  Family Communication; husband and daughter at bedside.  Disposition Plan: for sx today   Consultants:   CVTS   Procedures:   ECHO pending.    Antimicrobials:   none   Subjective: Denies chest pain, report SOB.  Report abdominal distension.   Objective: Vitals:   10/24/16 1145 10/24/16 1355 10/24/16 2015 10/25/16 0512  BP: (!) 144/72 (!) 151/62 138/63 135/70  Pulse: 96 84 90 85  Resp:  (!) 21 20 20   Temp:  99.3 F (37.4 C) 99.7 F (37.6 C) 97.9 F (36.6 C) 97.9 F (36.6 C)  TempSrc: Oral Oral Oral Oral  SpO2: 97% 100% 97% 96%  Weight:      Height:        Intake/Output Summary (Last 24 hours) at 10/25/16 0955 Last data filed at 10/25/16 0800  Gross per 24 hour  Intake              955 ml  Output              100 ml  Net              855 ml   Filed Weights   10/23/16 1415  Weight: 88 kg (194 lb)    Examination:  General exam: Appears calm and comfortable  Respiratory system: Clear to auscultation. Respiratory effort normal. Cardiovascular system: S1 & S2 heard, RRR. No JVD, murmurs, rubs, gallops or clicks. No pedal edema. Gastrointestinal system: Abdomen is nondistended, soft and nontender. No organomegaly or masses felt. Normal bowel sounds heard. Central nervous system: Alert and oriented. No focal neurological deficits. Extremities: Symmetric 5 x 5 power. Skin: No rashes, lesions or ulcers Psychiatry: Judgement and insight appear normal. Mood & affect appropriate.     Data Reviewed: I have personally reviewed following labs and imaging studies  CBC:  Recent Labs Lab 10/23/16 1450 10/24/16 0620 10/24/16 1316  WBC 18.2* 12.5* 14.0*  NEUTROABS 14.2*  --   --   HGB 12.1 11.0* 10.9*  HCT 36.8 34.7* 33.9*  MCV 88.0 88.7 86.9  PLT 381 344 [id number]   Basic Metabolic Panel:  Recent Labs Lab 10/23/16 1450 10/24/16 0620 10/24/16 1316  NA 133* 136 136  K 3.7 3.7 3.9  CL 98* 101 101  CO2 25 26 26   GLUCOSE 131* 117* 113*  BUN 18 18 14   CREATININE 0.94 0.70 0.80  CALCIUM 8.7* 8.5* 8.6*   GFR: Estimated Creatinine Clearance: 72.1 mL/min (by C-G formula based on SCr of 0.8 mg/dL). Liver Function Tests:  Recent Labs Lab 10/23/16 1450 10/24/16 1316  AST 33 29  ALT 21 21  ALKPHOS 154* 127*  BILITOT 1.3* 0.6  PROT 8.0 6.2*  ALBUMIN 3.6 3.0*   No results for input(s): LIPASE, AMYLASE in the last 168 hours. No results for input(s): AMMONIA in the last 168  hours. Coagulation Profile:  Recent Labs Lab 10/24/16 1316 10/25/16 0147  INR 1.15 1.17   Cardiac Enzymes:  Recent Labs Lab 10/23/16 1450 10/24/16 1316 10/25/16 0147  TROPONINI <0.03 0.03* <0.03   BNP (last 3 results) No results for input(s): PROBNP in the last 8760 hours. HbA1C: No results for input(s): HGBA1C in the last 72 hours. CBG: No results for input(s): GLUCAP in the last 168 hours. Lipid Profile: No results for input(s): CHOL, HDL, LDLCALC, TRIG, CHOLHDL, LDLDIRECT in the last 72 hours. Thyroid Function Tests: No results for input(s): TSH, T4TOTAL, FREET4, T3FREE, THYROIDAB in the last 72 hours. Anemia Panel: No results for input(s): VITAMINB12, FOLATE, FERRITIN, TIBC, IRON, RETICCTPCT in the last 72 hours. Sepsis Labs: No results for input(s): PROCALCITON, LATICACIDVEN in the last 168 hours.  No results found for this or any previous visit (from the past 240 hour(s)).       Radiology Studies: Dg Chest 2 View  Result Date: 10/23/2016 CLINICAL DATA:  Shortness of breath and chest pain. EXAM: CHEST  2 VIEW COMPARISON:  November 01, 2011 FINDINGS: No pneumothorax. Significant opacification of the left hemi thorax with only a small amount of apex aerated. On the lateral view, there appears to be a mass located superiorly measuring 4.8 cm. There is probably associated effusion. No other acute abnormalities. Mild atelectasis in the right base. IMPRESSION: 1. Significant opacification of left hemi thorax with an apparent 4.8 cm mass seen on the lateral view. Probable associated effusion. Recommend CT scan. Electronically Signed   By: Dorise Bullion III M.D   On: 10/23/2016 15:52   Ct Chest W Contrast  Result Date: 10/23/2016 CLINICAL DATA:  Left pleural effusion. Patient is very short of breath. EXAM: CT CHEST WITH CONTRAST TECHNIQUE: Multidetector CT imaging of the chest was performed during intravenous contrast administration. CONTRAST:  63mL ISOVUE-300 IOPAMIDOL  (ISOVUE-300) INJECTION 61% COMPARISON:  08/13/2009. FINDINGS: Cardiovascular: Heart size is normal. Small to moderate pericardial effusion noted. There is abdominal aortic atherosclerosis without aneurysm. No evidence for thoracic aortic dissection. Mediastinum/Nodes: 12 mm short axis subcarinal lymph node is mildly enlarged. 10 mm borderline enlarged short axis lymph node identified right hilum. 10 mm short axis lymph node identified left hilum. Lungs/Pleura: Left lower lobe and lingular collapse is associated with a moderate left pleural effusion demonstrating areas of loculation. No discernible pleural thickening or nodularity. No evidence for right pleural effusion. No focal airspace consolidation on the right. 8 mm right lower lobe pulmonary nodule is seen on image 112 of series 4. Upper Abdomen: Unremarkable Musculoskeletal: Bone windows reveal no worrisome lytic or sclerotic osseous lesions. IMPRESSION: 1. Lingular and left lower lobe collapse associated with moderate loculated left pleural effusion. Although no  central obstructing mass lesion is evident, given unilateral character of disease, malignancy must be excluded. 2. Borderline mediastinal and bilateral hilar lymphadenopathy. 3. 8 mm right lower lobe pulmonary nodule. Attention on follow-up imaging recommended. Electronically Signed   By: Misty Stanley M.D.   On: 10/23/2016 17:25        Scheduled Meds: . aspirin  325 mg Oral Daily  . enoxaparin (LOVENOX) injection  40 mg Subcutaneous Q24H  . escitalopram  20 mg Oral QHS  . vancomycin  1,000 mg Intravenous On Call to OR   Continuous Infusions: . dextrose 5 % and 0.45 % NaCl with KCl 20 mEq/L 100 mL/hr at 10/25/16 0800     LOS: 2 days    Time spent: 35 minutes.     Elmarie Shiley, MD [facility] Pager [phone]  If 7PM-7AM, please contact night-coverage [email or web address] Password [email or web address] 10/25/2016, 9:55 AM

## 2016-10-25 NOTE — Interval H&P Note (Signed)
History and Physical Interval Note:  10/25/2016 11:14 AM  Andrea Martinez  has presented today for surgery, with the diagnosis of pleural and pericardial effusion  The various methods of treatment have been discussed with the patient and family. After consideration of risks, benefits and other options for treatment, the patient has consented to  Procedure(s): VIDEO ASSISTED THORACOSCOPY (VATS)/EMPYEMA, BRONCH,DRAINAGE OF PLEURAL EFFUSION,POSSIBLE SUBXIPHOID PERICARDIAL WINDOW WITH TEE (Left) as a surgical intervention .  The patient's history has been reviewed, patient examined, no change in status, stable for surgery.  I have reviewed the patient's chart and labs.  Questions were answered to the patient's satisfaction.     [name]

## 2016-10-25 NOTE — Brief Op Note (Addendum)
10/23/2016 - 10/25/2016  2:25 PM  PATIENT:  Andrea Martinez  70 y.o. female  PRE-OPERATIVE DIAGNOSIS:  Pleural and pericardial effusion  POST-OPERATIVE DIAGNOSIS:  Pleural and pericardial effusion  PROCEDURE:   VIDEO BRONCHOSCOPY,  LEFT VIDEO ASSISTED THORACOSCOPY (VATS), DRAINAGE OF LEFT PLEURAL EFFUSION,  VISCERAL PLEURAL DECORTICATION PERICARDIAL WINDOW   FINDINGS: Slightly less than 2000 cc of left pleural fluid removed;approximately 500 cc of pericardial fluid removed  SURGEON:  Surgeon(s) and Role:    * [name], MD - Primary  PHYSICIAN ASSISTANT: Donielle.Zimmmerman PA-C  ANESTHESIA:   general  EBL:  Total I/O In: 950 [P.O.:50; I.V.:900] Out: 1900 [Urine:250; Other:1500; Blood:150]  BLOOD ADMINISTERED:none  DRAINS: 38 Blake drain placed in the pericardial space;28 Blake and 8 French chest tube placed in the left pleural space   SPECIMEN:  Source of Specimen:  Left pleural fluid, pericardial fluid, pericardial biopsy, parietal and visceral pleural biopsies  DISPOSITION OF SPECIMEN:  Pathology, cytology, culture  COUNTS CORRECT  YES  DICTATION: .Dragon Dictation  PLAN OF CARE: Admit to inpatient   PATIENT DISPOSITION:  PACU - hemodynamically stable.   Delay start of Pharmacological VTE agent (>24hrs) due to surgical blood loss or risk of bleeding: yes

## 2016-10-25 NOTE — Progress Notes (Signed)
  Echocardiogram Echocardiogram Transesophageal has been performed.  Andrea Martinez 10/25/2016, 1:28 PM

## 2016-10-25 NOTE — Anesthesia Postprocedure Evaluation (Signed)
Anesthesia Post Note  Patient: [place]  Procedure(s) Performed: Procedure(s) (LRB): VIDEO ASSISTED THORACOSCOPY (VATS), BRONCH,DRAINAGE OF PLEURAL EFFUSION,PERICARDIAL WINDOW WITH DRAINAGE OF PERICARDIAL FLUID, TEE (Left) VIDEO BRONCHOSCOPY (N/A)  Patient location during evaluation: PACU Anesthesia Type: General Level of consciousness: awake and alert, oriented and patient cooperative Pain management: pain level controlled Vital Signs Assessment: post-procedure vital signs reviewed and stable Respiratory status: spontaneous breathing, nonlabored ventilation, respiratory function stable and patient connected to nasal cannula oxygen Cardiovascular status: blood pressure returned to baseline and stable Postop Assessment: no signs of nausea or vomiting Anesthetic complications: no    Last Vitals:  Vitals:   10/25/16 1650 10/25/16 1705  BP: 125/67   Pulse: 98   Resp: 16   Temp:  36.5 C    Last Pain:  Vitals:   10/25/16 1705  TempSrc:   PainSc: Asleep                 Letecia Arps,E. Annise Boran

## 2016-10-25 NOTE — Anesthesia Procedure Notes (Signed)
Procedure Name: Intubation Date/Time: 10/25/2016 12:42 PM Performed by: Mariea Clonts Pre-anesthesia Checklist: Patient identified, Emergency Drugs available, Suction available, Patient being monitored and Timeout performed Patient Re-evaluated:Patient Re-evaluated prior to inductionOxygen Delivery Method: Circle system utilized Preoxygenation: Pre-oxygenation with 100% oxygen Intubation Type: IV induction Ventilation: Oral airway inserted - appropriate to patient size and Two handed mask ventilation required Laryngoscope Size: Mac and 3 Grade View: Grade II Tube type: Oral Tube size: 8.0 mm Number of attempts: 1 Airway Equipment and Method: Stylet Placement Confirmation: ETT inserted through vocal cords under direct vision,  positive ETCO2 and breath sounds checked- equal and bilateral Secured at: 22 cm Tube secured with: Tape Dental Injury: Teeth and Oropharynx as per pre-operative assessment

## 2016-10-25 NOTE — Transfer of Care (Signed)
Immediate Anesthesia Transfer of Care Note  Patient: Andrea Martinez  Procedure(s) Performed: Procedure(s): VIDEO ASSISTED THORACOSCOPY (VATS), BRONCH,DRAINAGE OF PLEURAL EFFUSION,PERICARDIAL WINDOW WITH DRAINAGE OF PERICARDIAL FLUID, TEE (Left) VIDEO BRONCHOSCOPY (N/A)  Patient Location: PACU  Anesthesia Type:General  Level of Consciousness: awake and alert   Airway & Oxygen Therapy: Patient Spontanous Breathing and Patient connected to face mask oxygen  Post-op Assessment: Report given to RN and Post -op Vital signs reviewed and stable  Post vital signs: Reviewed and stable  Last Vitals:  Vitals:   10/24/16 2015 10/25/16 0512  BP: 138/63 135/70  Pulse: 90 85  Resp: 20 20  Temp: 36.6 C 36.6 C    Last Pain:  Vitals:   10/25/16 0800  TempSrc:   PainSc: 5       Patients Stated Pain Goal: 0 ([id number])  Complications: No apparent anesthesia complications

## 2016-10-26 ENCOUNTER — Encounter (HOSPITAL_COMMUNITY): Payer: Self-pay | Admitting: Thoracic Surgery (Cardiothoracic Vascular Surgery)

## 2016-10-26 ENCOUNTER — Inpatient Hospital Stay (HOSPITAL_COMMUNITY): Payer: Medicare Other

## 2016-10-26 LAB — BASIC METABOLIC PANEL
ANION GAP: 6 (ref 5–15)
BUN: 9 mg/dL (ref 6–20)
CHLORIDE: 106 mmol/L (ref 101–111)
CO2: 26 mmol/L (ref 22–32)
CREATININE: 0.71 mg/dL (ref 0.44–1.00)
Calcium: 8.2 mg/dL — ABNORMAL LOW (ref 8.9–10.3)
GFR calc non Af Amer: >60 mL/min (ref >60)
Glucose, Bld: 162 mg/dL — ABNORMAL HIGH (ref 65–99)
Potassium: 4.7 mmol/L (ref 3.5–5.1)
SODIUM: 138 mmol/L (ref 135–145)

## 2016-10-26 LAB — GLUCOSE, CAPILLARY
GLUCOSE-CAPILLARY: 148 mg/dL — AB (ref 65–99)
Glucose-Capillary: 128 mg/dL — ABNORMAL HIGH (ref 65–99)
Glucose-Capillary: 148 mg/dL — ABNORMAL HIGH (ref 65–99)
Glucose-Capillary: 156 mg/dL — ABNORMAL HIGH (ref 65–99)
Glucose-Capillary: 159 mg/dL — ABNORMAL HIGH (ref 65–99)

## 2016-10-26 LAB — CBC
HEMATOCRIT: 35.3 % — AB (ref 36.0–46.0)
HEMOGLOBIN: 11.2 g/dL — AB (ref 12.0–15.0)
MCH: 27.9 pg (ref 26.0–34.0)
MCHC: 31.7 g/dL (ref 30.0–36.0)
MCV: 88 fL (ref 78.0–100.0)
Platelets: 328 10*3/uL (ref 150–400)
RBC: 4.01 MIL/uL (ref 3.87–5.11)
RDW: 13.5 % (ref 11.5–15.5)
WBC: 22 10*3/uL — AB (ref 4.0–10.5)

## 2016-10-26 LAB — BLOOD GAS, ARTERIAL
Acid-base deficit: 1 mmol/L (ref 0.0–2.0)
Bicarbonate: 24.4 mmol/L (ref 20.0–28.0)
Drawn by: 41977
O2 Content: 5 L/min
O2 Saturation: 98.1 %
PATIENT TEMPERATURE: 98.6
PCO2 ART: 49.5 mmHg — AB (ref 32.0–48.0)
PO2 ART: 123 mmHg — AB (ref 83.0–108.0)
pH, Arterial: 7.313 — ABNORMAL LOW (ref 7.350–7.450)

## 2016-10-26 LAB — URINE CULTURE: CULTURE: NO GROWTH

## 2016-10-26 LAB — MISC LABCORP TEST (SEND OUT)
LABCORP TEST CODE: 9985
LABCORP TEST CODE: 9985

## 2016-10-26 LAB — MRSA PCR SCREENING: MRSA BY PCR: NEGATIVE

## 2016-10-26 MED ORDER — VANCOMYCIN HCL 10 G IV SOLR
1500.0000 mg | Freq: Once | INTRAVENOUS | Status: AC
  Administered 2016-10-26: 1500 mg via INTRAVENOUS
  Filled 2016-10-26: qty 1500

## 2016-10-26 MED ORDER — INSULIN ASPART 100 UNIT/ML ~~LOC~~ SOLN
0.0000 [IU] | Freq: Three times a day (TID) | SUBCUTANEOUS | Status: DC
  Administered 2016-10-26: 3 [IU] via SUBCUTANEOUS
  Administered 2016-10-26 – 2016-10-28 (×3): 2 [IU] via SUBCUTANEOUS

## 2016-10-26 MED ORDER — PIPERACILLIN-TAZOBACTAM 3.375 G IVPB
3.3750 g | Freq: Three times a day (TID) | INTRAVENOUS | Status: DC
  Administered 2016-10-26 – 2016-11-03 (×24): 3.375 g via INTRAVENOUS
  Filled 2016-10-26 (×28): qty 50

## 2016-10-26 MED ORDER — VANCOMYCIN HCL IN DEXTROSE 1-5 GM/200ML-% IV SOLN
1000.0000 mg | Freq: Two times a day (BID) | INTRAVENOUS | Status: DC
  Administered 2016-10-26 – 2016-10-30 (×7): 1000 mg via INTRAVENOUS
  Filled 2016-10-26 (×10): qty 200

## 2016-10-26 NOTE — Progress Notes (Signed)
1 Day Post-Op Procedure(s) (LRB): VIDEO ASSISTED THORACOSCOPY (VATS), BRONCH,DRAINAGE OF PLEURAL EFFUSION,PERICARDIAL WINDOW WITH DRAINAGE OF PERICARDIAL FLUID, TEE (Left) VIDEO BRONCHOSCOPY (N/A) Subjective: Some incisional pain Denies nausea, headache  Objective: Vital signs in last 24 hours: Temp:  [97 F (36.1 C)-98.4 F (36.9 C)] 97.6 F (36.4 C) (11/14 0744) Pulse Rate:  [63-104] 72 (11/14 0744) Cardiac Rhythm: Normal sinus rhythm (11/14 0432) Resp:  [10-22] 17 (11/14 0744) BP: (105-158)/(49-78) 109/50 (11/14 0744) SpO2:  [90 %-100 %] 100 % (11/14 0744) Arterial Line BP: (117-169)/(41-62) 169/62 (11/13 1800)  Hemodynamic parameters for last 24 hours:    Intake/Output from previous day: 11/13 0701 - 11/14 0700 In: 3521.7 [P.O.:650; I.V.:2821.7; IV Piggyback:50] Out: [id number] [Urine:825; Blood:150; Chest Tube:410] Intake/Output this shift: Total I/O In: 50 [IV Piggyback:50] Out: 200 [Urine:200]  General appearance: alert, cooperative and no distress Neurologic: intact Heart: regular rate and rhythm Lungs: diminished breath sounds left base Abdomen: normal findings: soft, non-tender no air leak  Lab Results:  Recent Labs  10/25/16 1025 10/26/16 0416  WBC 11.8* 22.0*  HGB 10.3* 11.2*  HCT 33.0* 35.3*  PLT 315 328   BMET:  Recent Labs  10/25/16 1025 10/26/16 0416  NA 136 138  K 3.8 4.7  CL 101 106  CO2 25 26  GLUCOSE 117* 162*  BUN 12 9  CREATININE 0.75 0.71  CALCIUM 8.5* 8.2*    PT/INR:  Recent Labs  10/25/16 0147  LABPROT 14.9  INR 1.17   ABG    Component Value Date/Time   PHART 7.313 (L) 10/26/2016 0300   HCO3 24.4 10/26/2016 0300   ACIDBASEDEF 1.0 10/26/2016 0300   O2SAT 98.1 10/26/2016 0300   CBG (last 3)   Recent Labs  10/26/16 0026 10/26/16 0622  GLUCAP 159* 148*    Assessment/Plan: S/P Procedure(s) (LRB): VIDEO ASSISTED THORACOSCOPY (VATS), BRONCH,DRAINAGE OF PLEURAL EFFUSION,PERICARDIAL WINDOW WITH DRAINAGE OF PERICARDIAL  FLUID, TEE (Left) VIDEO BRONCHOSCOPY (N/A) -  CV- s/p pericardial window, in SR  RESP- s/p drainage of pleural effusion  Frozen showed no evidence of malignancy  Await final path  Appeared inflammatory in nature, was not purulent- likely para-pneumonic effusion  Persistent left lower lobe atelectasis/ consolidation  ID- on vanco and zosyn pending culture results  RENAL- creatinine and lytes OK  ENDO- CBG mildly elevated, continue SSI, change to AC/HS  SCD + enoxaparin for DVT prophylaxis  ambulate   LOS: 3 days    [name] 10/26/2016

## 2016-10-26 NOTE — Progress Notes (Signed)
Pharmacy Antibiotic Note  Andrea Martinez is a 71 y.o. female s/p VATS 11/13, now with leukocytosis and PNA Pharmacy has been consulted for Vancomycin and Zosyn dosing.  Plan: Vancomycin 1500 mg IV now, then Vancomycin 1 g IV q12h Zosyn 3.375 g IV q8h   Height: 5\' 6"  (167.6 cm) Weight: 194 lb (88 kg) IBW/kg (Calculated) : 59.3  Temp (24hrs), Avg:97.6 F (36.4 C), Min:97 F (36.1 C), Max:98.4 F (36.9 C)   Recent Labs Lab 10/23/16 1450 10/24/16 0620 10/24/16 1316 10/25/16 1025 10/26/16 0416  WBC 18.2* 12.5* 14.0* 11.8* 22.0*  CREATININE 0.94 0.70 0.80 0.75 0.71    Estimated Creatinine Clearance: 72.1 mL/min (by C-G formula based on SCr of 0.71 mg/dL).    Allergies  Allergen Reactions  . Codeine Nausea Only    Headache  . Esomeprazole Magnesium Nausea Only  . Sulfonamide Derivatives Nausea Only    Headache  . Fluconazole Rash    Redness and blistering on left thigh   Caryl Pina 10/26/2016 7:28 AM

## 2016-10-26 NOTE — Op Note (Signed)
NAMEMarland Kitchen  Andrea, Martinez                 ACCOUNT NO.:  [id number]  MEDICAL RECORD NO.:  [id number]  LOCATION:  3S16C                        FACILITY:  [facility]  PHYSICIAN:  Revonda Standard. Roxan Hockey, M.D.DATE OF BIRTH:  12-04-1946  DATE OF PROCEDURE:  10/25/2016 DATE OF DISCHARGE:                              OPERATIVE REPORT   PREOPERATIVE DIAGNOSIS:  Left pleural and pericardial effusions.  POSTOPERATIVE DIAGNOSIS:  Left pleural and pericardial effusions.  PROCEDURE:   Bronchoscopy, Left video-assisted thoracoscopy, Drainage of pleural effusion, Parietal pleural biopsy, Visceral pleural decortication, Pericardial window.  SURGEON:  Revonda Standard. Roxan Hockey, M.D.  ASSISTANT:  Lars Pinks, PA  ANESTHESIA:  General.  FINDINGS:  Bronchoscopy- normal endobronchial anatomy with some extrinsic compression on the lower lobe bronchus on the left.    VATS- 1.5 L of serosanguineous pleural fluid drained, intense inflammatory reaction of the parietal pleura. Visceral pleural peel requiring decortication, came off easily. Pericardial fluid serous, pericardium appeared unremarkable.  CLINICAL NOTE:  Andrea Martinez is a 70 year old woman with history of chronic myelogenous leukemia.  She presented with shortness of breath and was found to have a large pleural effusion.  This was a complex pleural effusion.  She also was noted to have a small pericardial effusion on CT scan.  She was transferred to [facility].  She was advised to undergo bronchoscopy and left video-assisted thoracoscopy and possible subxiphoid pericardial window, if the pericardium could not be drained through the left chest.  The indications, risks, benefits, and alternatives were discussed in detail with the patient.  She understood and accepted the risks and agreed to proceed.  OPERATIVE NOTE:  Andrea Martinez was brought to the operating room on October 25, 2016.  She had induction of general anesthesia and was intubated.   Flexible fiberoptic bronchoscopy was performed via the endotracheal tube.  It revealed normal endobronchial anatomy.  There was some extrinsic compression primarily of the basilar segmental bronchi of the left lower lobe.  When the scope passed this area, there were no endobronchial lesions seen.  She was reintubated with a double-lumen endotracheal tube.  Dr. Annye Asa of Anesthesia did a transesophageal echocardiography, which showed a moderate pericardial effusion without tamponade.  A Foley catheter was placed.  Sequential compression devices were in place on the calves for DVT prophylaxis.  Intravenous antibiotics were administered.  She was placed in a right lateral decubitus position, and the left chest was prepped and draped in usual sterile fashion.  Single lung ventilation of the right lung was initiated and was tolerated well throughout the procedure.  An incision was made in the seventh intercostal space in the midaxillary line.  The chest was entered.  A sucker was placed into the chest. Serosanguineous fluid was evacuated.  This was more serous than sanguinous and did not appear purulent.  Specimens were sent for cultures, serologies and cytologies.  Over 1.5 L of fluid was evacuated. A 5 mm port was inserted through the incision, and the thoracoscope was inserted.  A working incision, 5 cm in length, was made in the anterolateral chest wall in the 5th intercostal space.  There were some adhesions of the lung to the chest wall, primarily  the upper lobe, but also the superior segment of the lower lobe superiorly.  The lower lobe was encased in a mild peel.  The parietal pleura was bright red and friable and bled easily.  Pleural biopsies were taken and were sent for frozen section as well as permanent pathology.  The frozen section returned showing no evidence of malignancy.  The visceral pleural peel then was decorticated off the lung. It came off relatively easily  without any major tears in the lung parenchyma.  The entire lung was freed up circumferentially and then the fissure was cleared as well.  The pericardium was incised.  The pleural layer overlying the pericardium proper was very inflamed, but the pericardium itself appeared relatively normal.  The pericardial fluid was approximately 400 mL and was a bright yellow serous fluid.  A pericardial window was created and the pericardium was sent for pathology.  A 19-French Blake drain was placed through a separate stab incision and directed into the pericardial space.  A 28-French chest tube was placed through another stab incision and directed anteriorly, and a 28-French Blake drain was placed to the original port incision and directed posteriorly.  All were secured to the skin with #1 silk sutures.  The left lung was reinflated, there was good re-expansion.  The chest tubes were placed to suction.  The patient was extubated in the operating room and taken to the postanesthetic care unit in good condition.     Revonda Standard Roxan Hockey, M.D.     SCH/MEDQ  D:  10/25/2016  T:  10/26/2016  Job:  [id number]

## 2016-10-26 NOTE — Progress Notes (Signed)
PROGRESS NOTE    Andrea Martinez  [id number] DOB: 02-Sep-1946 DOA: 10/23/2016 PCP: [name], MD   Brief Narrative: Andrea Martinez is a 70 y.o. female with a history of CML on chemotherapy, hypothyroidism, leukocytosis. She presents with 3 days of worsening shortness of breath acutely worse today. She is dyspneic on exertion to 10 of 15 feet. Improves at rest and on oxygen. Patient even has dyspnea with getting out of bed. Due to worsening symptoms, the patient presents to the hospital for evaluation  Emergency department course: Chest x-ray of the chest shows pleural effusion to the mid lung field. CT shows collapse of left lower lobe and left lingular lobe with 6 mm right lower lobe nodule. Patient satting well on nasal cannula    Assessment & Plan:   Principal Problem:   Pleural effusion Active Problems:   Chronic myelogenous leukemia (CML), BCR-ABL1-positive (HCC)   Solitary pulmonary nodule   1-Left Pleural effusion, pericardial effusion.  CVTS consulted.  Underwent Video-bronchoscopy left, drainage left pleural effusion, drainage of pericardial fluid  Window. S/P biopsy of pleural, pericardium 11-13.  Concern with para-pneumonic effusion, biopsy pending. Bronchial lavage culture; Growing rare gram positive cocci in chains and pair. Started Vancomycin and Zosyn. Follow culture.    2-Chest pain; chest pain free. Troponin negative. ECHO EF 55 %  3-Hypothyroidism; Not on medications.   4-Leukocytosis; UA with too numerous to count WBC. Urine culture ordered.  Bronchial washing  Growing rare gram positive cocci in chains and pair. Started Vancomycin and Zosyn. Follow culture.    CML;Was On Chemo  Hold chemotherapy per Dr. Whitney Muse Solitary Pulmonary Nodule;     DVT prophylaxis: Lovenox.  Code Status: Full code.  Family Communication; No family at bedside at time of rounding.  Disposition Plan: for sx today   Consultants:   CVTS   Procedures:   ECHO  normal EF    Antimicrobials:   none   Subjective: Report mild pain.  Breathing better   Objective: Vitals:   10/26/16 0027 10/26/16 0400 10/26/16 0432 10/26/16 0744  BP:   (!) 109/49 (!) 109/50  Pulse:   63 72  Resp: 15 10 15 17   Temp:   97.5 F (36.4 C) 97.6 F (36.4 C)  TempSrc:   Oral Oral  SpO2: 100% 100% 100% 100%  Weight:      Height:        Intake/Output Summary (Last 24 hours) at 10/26/16 0809 Last data filed at 10/26/16 0743  Gross per 24 hour  Intake          3521.67 ml  Output             3435 ml  Net            86.67 ml   Filed Weights   10/23/16 1415  Weight: 88 kg (194 lb)    Examination:  General exam: Appears calm and comfortable  Respiratory system: Clear to auscultation. Respiratory effort normal. Cardiovascular system: S1 & S2 heard, RRR. No JVD, murmurs, rubs, gallops or clicks. No pedal edema. Gastrointestinal system: Abdomen is nondistended, soft and nontender. No organomegaly or masses felt. Normal bowel sounds heard. Central nervous system: Alert and oriented. No focal neurological deficits. Extremities: Symmetric 5 x 5 power. Skin: No rashes, lesions or ulcers Psychiatry: Judgement and insight appear normal. Mood & affect appropriate.     Data Reviewed: I have personally reviewed following labs and imaging studies  CBC:  Recent Labs Lab 10/23/16 1450 10/24/16  [id number] 10/24/16 1316 10/25/16 1025 10/26/16 0416  WBC 18.2* 12.5* 14.0* 11.8* 22.0*  NEUTROABS 14.2*  --   --   --   --   HGB 12.1 11.0* 10.9* 10.3* 11.2*  HCT 36.8 34.7* 33.9* 33.0* 35.3*  MCV 88.0 88.7 86.9 87.1 88.0  PLT 381 344 336 315 [id number]   Basic Metabolic Panel:  Recent Labs Lab 10/23/16 1450 10/24/16 0620 10/24/16 1316 10/25/16 1025 10/26/16 0416  NA 133* 136 136 136 138  K 3.7 3.7 3.9 3.8 4.7  CL 98* 101 101 101 106  CO2 25 26 26 25 26   GLUCOSE 131* 117* 113* 117* 162*  BUN 18 18 14 12 9   CREATININE 0.94 0.70 0.80 0.75 0.71  CALCIUM 8.7* 8.5*  8.6* 8.5* 8.2*   GFR: Estimated Creatinine Clearance: 72.1 mL/min (by C-G formula based on SCr of 0.71 mg/dL). Liver Function Tests:  Recent Labs Lab 10/23/16 1450 10/24/16 1316  AST 33 29  ALT 21 21  ALKPHOS 154* 127*  BILITOT 1.3* 0.6  PROT 8.0 6.2*  ALBUMIN 3.6 3.0*   No results for input(s): LIPASE, AMYLASE in the last 168 hours. No results for input(s): AMMONIA in the last 168 hours. Coagulation Profile:  Recent Labs Lab 10/24/16 1316 10/25/16 0147  INR 1.15 1.17   Cardiac Enzymes:  Recent Labs Lab 10/23/16 1450 10/24/16 1316 10/25/16 0147  TROPONINI <0.03 0.03* <0.03   BNP (last 3 results) No results for input(s): PROBNP in the last 8760 hours. HbA1C: No results for input(s): HGBA1C in the last 72 hours. CBG:  Recent Labs Lab 10/26/16 0026 10/26/16 0622  GLUCAP 159* 148*   Lipid Profile: No results for input(s): CHOL, HDL, LDLCALC, TRIG, CHOLHDL, LDLDIRECT in the last 72 hours. Thyroid Function Tests: No results for input(s): TSH, T4TOTAL, FREET4, T3FREE, THYROIDAB in the last 72 hours. Anemia Panel: No results for input(s): VITAMINB12, FOLATE, FERRITIN, TIBC, IRON, RETICCTPCT in the last 72 hours. Sepsis Labs: No results for input(s): PROCALCITON, LATICACIDVEN in the last 168 hours.  Recent Results (from the past 240 hour(s))  Gram stain     Status: None   Collection Time: 10/25/16  1:55 PM  Result Value Ref Range Status   Specimen Description FLUID LEFT PLEURAL  Final   Special Requests NONE  Final   Gram Stain   Final    CYTOSPIN SMEAR WBC PRESENT,BOTH PMN AND MONONUCLEAR NO ORGANISMS SEEN    Report Status 10/25/2016 FINAL  Final  Culture, respiratory (NON-Expectorated)     Status: None (Preliminary result)   Collection Time: 10/25/16  2:10 PM  Result Value Ref Range Status   Specimen Description BRONCHIAL WASHINGS LEFT  Final   Special Requests PATIENT ON FOLLOWING  VANCY  Final   Gram Stain   Final    RARE WBC PRESENT,  PREDOMINANTLY MONONUCLEAR RARE GRAM POSITIVE COCCI IN PAIRS AND CHAINS    Culture PENDING  Incomplete   Report Status PENDING  Incomplete  MRSA PCR Screening     Status: None   Collection Time: 10/26/16  4:12 AM  Result Value Ref Range Status   MRSA by PCR NEGATIVE NEGATIVE Final    Comment:        The GeneXpert MRSA Assay (FDA approved for NASAL specimens only), is one component of a comprehensive MRSA colonization surveillance program. It is not intended to diagnose MRSA infection nor to guide or monitor treatment for MRSA infections.          Radiology Studies: Dg Chest  Port 1 View  Result Date: 10/26/2016 CLINICAL DATA:  Left-sided pneumothorax with chest tube treatment EXAM: PORTABLE CHEST 1 VIEW COMPARISON:  Portable chest x-ray of October 25, 2016 FINDINGS: A tiny left apical pleural line is visible consistent with a less than 5% pneumothorax. The 2 chest tubes are in stable position. There is considerable interstitial and alveolar density in the left mid and lower lung with partial obscuration of the hemidiaphragm. There is minimal atelectasis or early interstitial infiltrate at the right lung base also. The cardiac silhouette where visualized appears enlarged. The pulmonary vascularity is not engorged. The mediastinum is normal in width. IMPRESSION: Less than 5% left apical pneumothorax persists. Considerable atelectasis or pneumonia in the left mid and lower lung persists. Mild atelectasis at the right lung base. No pleural effusion. Cardiomegaly without significant pulmonary vascular congestion. The 3 chest tubes are in stable position. Electronically Signed   By: David  Martinique M.D.   On: 10/26/2016 07:38   Dg Chest Port 1 View  Result Date: 10/25/2016 CLINICAL DATA:  Postoperative, evaluate for pneumothorax EXAM: PORTABLE CHEST 1 VIEW COMPARISON:  10/23/2016 chest radiograph. FINDINGS: Two left upper chest tubes are in place. Left internal jugular central venous  catheter terminates in the upper third of the superior vena cava. Stable cardiomediastinal silhouette with normal heart size. No pneumothorax. No right pleural effusion. Persistent extensive patchy consolidation in the mid to lower left lung. IMPRESSION: 1. No pneumothorax.  Two left upper chest tubes are in place. 2. Persistent extensive patchy consolidation in the mid to lower left lung, favor pneumonia and atelectasis, with a residual loculated left pleural effusion component or underlying lung mass not excluded. Electronically Signed   By: Ilona Sorrel M.D.   On: 10/25/2016 15:49        Scheduled Meds: . acetaminophen  1,000 mg Oral Q6H   Or  . acetaminophen (TYLENOL) oral liquid 160 mg/5 mL  1,000 mg Oral Q6H  . aspirin  325 mg Oral Daily  . bisacodyl  10 mg Oral Daily  . enoxaparin (LOVENOX) injection  40 mg Subcutaneous Q24H  . escitalopram  20 mg Oral QHS  . fentaNYL   Intravenous Q4H  . insulin aspart  0-24 Units Subcutaneous Q6H  . piperacillin-tazobactam (ZOSYN)  IV  3.375 g Intravenous Q8H  . senna-docusate  1 tablet Oral QHS  . vancomycin  1,500 mg Intravenous Once  . vancomycin  1,000 mg Intravenous Q12H   Continuous Infusions: . dextrose 5 % and 0.9 % NaCl with KCl 20 mEq/L 100 mL/hr at 10/25/16 2000     LOS: 3 days    Time spent: 35 minutes.     Elmarie Shiley, MD [facility] Pager [phone]  If 7PM-7AM, please contact night-coverage [email or web address] Password [email or web address] 10/26/2016, 8:09 AM

## 2016-10-27 ENCOUNTER — Inpatient Hospital Stay (HOSPITAL_COMMUNITY): Payer: Medicare Other

## 2016-10-27 LAB — COMPREHENSIVE METABOLIC PANEL
ALT: 21 U/L (ref 14–54)
ANION GAP: 6 (ref 5–15)
AST: 29 U/L (ref 15–41)
Albumin: 2 g/dL — ABNORMAL LOW (ref 3.5–5.0)
Alkaline Phosphatase: 102 U/L (ref 38–126)
BUN: 15 mg/dL (ref 6–20)
CHLORIDE: 106 mmol/L (ref 101–111)
CO2: 26 mmol/L (ref 22–32)
Calcium: 8 mg/dL — ABNORMAL LOW (ref 8.9–10.3)
Creatinine, Ser: 0.9 mg/dL (ref 0.44–1.00)
GFR calc non Af Amer: >60 mL/min (ref >60)
Glucose, Bld: 119 mg/dL — ABNORMAL HIGH (ref 65–99)
Potassium: 3.9 mmol/L (ref 3.5–5.1)
SODIUM: 138 mmol/L (ref 135–145)
Total Bilirubin: 0.4 mg/dL (ref 0.3–1.2)
Total Protein: 5.1 g/dL — ABNORMAL LOW (ref 6.5–8.1)

## 2016-10-27 LAB — CBC
HCT: 31.7 % — ABNORMAL LOW (ref 36.0–46.0)
Hemoglobin: 9.8 g/dL — ABNORMAL LOW (ref 12.0–15.0)
MCH: 27.2 pg (ref 26.0–34.0)
MCHC: 30.9 g/dL (ref 30.0–36.0)
MCV: 88.1 fL (ref 78.0–100.0)
PLATELETS: 323 10*3/uL (ref 150–400)
RBC: 3.6 MIL/uL — AB (ref 3.87–5.11)
RDW: 13.4 % (ref 11.5–15.5)
WBC: 15.4 10*3/uL — ABNORMAL HIGH (ref 4.0–10.5)

## 2016-10-27 LAB — GLUCOSE, CAPILLARY
GLUCOSE-CAPILLARY: 91 mg/dL (ref 65–99)
GLUCOSE-CAPILLARY: 148 mg/dL — AB (ref 65–99)
Glucose-Capillary: 148 mg/dL — ABNORMAL HIGH (ref 65–99)
Glucose-Capillary: 101 mg/dL — ABNORMAL HIGH (ref 65–99)
Glucose-Capillary: 111 mg/dL — ABNORMAL HIGH (ref 65–99)

## 2016-10-27 LAB — CULTURE, RESPIRATORY W GRAM STAIN: Culture: NORMAL

## 2016-10-27 NOTE — Care Management Note (Addendum)
Case Management Note  Patient Details  Name: Andrea Martinez MRN: [id number] Date of Birth: 1946/04/16  Subjective/Objective:    S/p Vats, drainage of pl effusion, pericardial window, bp soft today, pericardial drain and anterior chest tube dc today, posterior chest tube in place. Conts on iv abx.  She lives with husband, pta indep, she has pcp, and medication coverage and transportation at dc.  NCM will cont to follow for dc needs.                 Action/Plan:   Expected Discharge Date:                  Expected Discharge Plan:  Home/Self Care  In-House Referral:     Discharge planning Services  CM Consult  Post Acute Care Choice:    Choice offered to:     DME Arranged:    DME Agency:     HH Arranged:    HH Agency:     Status of Service:  In process, will continue to follow  If discussed at Long Length of Stay Meetings, dates discussed:    Additional Comments:  Zenon Mayo, RN 10/27/2016, 5:10 PM

## 2016-10-27 NOTE — Progress Notes (Signed)
2 Days Post-Op Procedure(s) (LRB): VIDEO ASSISTED THORACOSCOPY (VATS), BRONCH,DRAINAGE OF PLEURAL EFFUSION,PERICARDIAL WINDOW WITH DRAINAGE OF PERICARDIAL FLUID, TEE (Left) VIDEO BRONCHOSCOPY (N/A) Subjective: Pain well controlled. Worried about CT removal  Objective: Vital signs in last 24 hours: Temp:  [97.4 F (36.3 C)-98 F (36.7 C)] 97.8 F (36.6 C) (11/15 0750) Pulse Rate:  [71-146] 78 (11/15 0750) Cardiac Rhythm: Normal sinus rhythm (11/15 0332) Resp:  [13-29] 15 (11/15 0750) BP: (99-123)/(39-95) 99/57 (11/15 0750) SpO2:  [92 %-100 %] 97 % (11/15 0750)  Hemodynamic parameters for last 24 hours:    Intake/Output from previous day: 11/14 0701 - 11/15 0700 In: 1140 [P.O.:240; I.V.:550; IV Piggyback:350] Out: [id number] [Urine:1525; Chest Tube:80] Intake/Output this shift: No intake/output data recorded.  General appearance: alert, cooperative and no distress Neurologic: intact Heart: regular rate and rhythm Lungs: diminished breath sounds bibasilar L > R Abdomen: normal findings: soft, non-tender no air leak, minimal serous drainage  Lab Results:  Recent Labs  10/26/16 0416 10/27/16 0440  WBC 22.0* 15.4*  HGB 11.2* 9.8*  HCT 35.3* 31.7*  PLT 328 323   BMET:  Recent Labs  10/26/16 0416 10/27/16 0440  NA 138 138  K 4.7 3.9  CL 106 106  CO2 26 26  GLUCOSE 162* 119*  BUN 9 15  CREATININE 0.71 0.90  CALCIUM 8.2* 8.0*    PT/INR:  Recent Labs  10/25/16 0147  LABPROT 14.9  INR 1.17   ABG    Component Value Date/Time   PHART 7.313 (L) 10/26/2016 0300   HCO3 24.4 10/26/2016 0300   ACIDBASEDEF 1.0 10/26/2016 0300   O2SAT 98.1 10/26/2016 0300   CBG (last 3)   Recent Labs  10/26/16 1707 10/26/16 2348 10/27/16 0511  GLUCAP 148* 128* 148*    Assessment/Plan: S/P Procedure(s) (LRB): VIDEO ASSISTED THORACOSCOPY (VATS), BRONCH,DRAINAGE OF PLEURAL EFFUSION,PERICARDIAL WINDOW WITH DRAINAGE OF PERICARDIAL FLUID, TEE (Left) VIDEO BRONCHOSCOPY  (N/A) -POD # 2 CV- in SR, BP remains on low side  Dc pericardial drain  RESP_ s/p drainage of pleural effusion- likely parapneumonic  Dc anterior chest tube, keep posterior tube in place for now  IS. Flutter  RENAL- creatinine OK  ID- on vanco and zosyn. WBC down form 22 to 15K. Afebrile  ENDO- CBG well controlled  DVT prophylaxis- SCD + enoxaparin. AMBULATE   LOS: 4 days    [name] 10/27/2016

## 2016-10-27 NOTE — Progress Notes (Signed)
PROGRESS NOTE  Andrea Martinez [id number] DOB: 1946-07-27 DOA: 10/23/2016 PCP: [name], MD   LOS: 4 days   Brief Narrative: Quintina Sanghvi Martinis a 70 y.o.femalewith a history of CML on chemotherapy, hypothyroidism, leukocytosis. She presents with 3 days of worsening shortness of breath acutely worse today. She is dyspneic on exertion to 10 of 15 feet. Improves at rest and on oxygen. Patient even has dyspnea with getting out of bed. Due to worsening symptoms, the patient presents to the hospital for evaluation  Assessment & Plan: Principal Problem:   Pleural effusion Active Problems:   Chronic myelogenous leukemia (CML), BCR-ABL1-positive (HCC)   Solitary pulmonary nodule   Left Pleural effusion, pericardial effusion.  - CVTS consulted, patient underwent Video-bronchoscopy left, drainage left pleural effusion, drainage of pericardial fluid  Window. S/P biopsy of pleural, pericardium 11-13. Tube d/c per TCV - Concern with para-pneumonic effusion, biopsy pending. Bronchial lavage culture; Growing rare gram positive cocci in chains and pair. Started Vancomycin and Zosyn. Follow culture.   Chest pain - Troponin negative. ECHO EF 55 %, in the setting of #1 - now has chest pain at the chest tube site  Hypothyroidism (?) - Not on medications, will check TSH  Leukocytosis - UA with too numerous to count WBC. Urine culture ordered.  - Bronchial washing  Growing rare gram positive cocci in chains and pair. Started Vancomycin and Zosyn. Follow cultures, still pending.   CML - Hold chemotherapy for now, will defer to Dr. Whitney Muse when this can be resumed in outpatient setting  Solitary Pulmonary Nodule - will need follow up CT per Dr. Luan Pulling  DVT prophylaxis: Lovenox Code Status: Full Family Communication: daughter bedside Disposition Plan: remain in SDU  Consultants:   Cardiothoracic surgery   Procedures:   Echo  VATS, bronchoscopy, pericardial  window  Antimicrobials:  Vancomycin 11/13 >>  Zosyn 11/13 >>   Subjective: - Complains of chest soreness of the tube site. No breathing difficulties.  Objective: Vitals:   10/27/16 0332 10/27/16 0434 10/27/16 0750 10/27/16 1112  BP: (!) 116/58  (!) 99/57   Pulse: 71  78   Resp: 14 13 15    Temp: 98 F (36.7 C)  97.8 F (36.6 C)   TempSrc: Oral  Oral   SpO2: 98% 94% 97% 98%  Weight:      Height:        Intake/Output Summary (Last 24 hours) at 10/27/16 1425 Last data filed at 10/27/16 1017  Gross per 24 hour  Intake             1330 ml  Output             1705 ml  Net             -375 ml   Filed Weights   10/23/16 1415  Weight: 88 kg (194 lb)    Examination: Constitutional: NAD Vitals:   10/27/16 0332 10/27/16 0434 10/27/16 0750 10/27/16 1112  BP: (!) 116/58  (!) 99/57   Pulse: 71  78   Resp: 14 13 15    Temp: 98 F (36.7 C)  97.8 F (36.6 C)   TempSrc: Oral  Oral   SpO2: 98% 94% 97% 98%  Weight:      Height:       Eyes: PERRL ENMT: Mucous membranes are moist.  Neck: normal, supple, no masses, no thyromegaly Respiratory: clear to auscultation bilaterally, no wheezing, no crackles. Chest tubes in place Cardiovascular: Regular rate and rhythm, no murmurs /  rubs / gallops.  Abdomen: no tenderness. Bowel sounds positive.   Data Reviewed: I have personally reviewed following labs and imaging studies  CBC:  Recent Labs Lab 10/23/16 1450 10/24/16 0620 10/24/16 1316 10/25/16 1025 10/26/16 0416 10/27/16 0440  WBC 18.2* 12.5* 14.0* 11.8* 22.0* 15.4*  NEUTROABS 14.2*  --   --   --   --   --   HGB 12.1 11.0* 10.9* 10.3* 11.2* 9.8*  HCT 36.8 34.7* 33.9* 33.0* 35.3* 31.7*  MCV 88.0 88.7 86.9 87.1 88.0 88.1  PLT 381 344 336 315 328 [id number]   Basic Metabolic Panel:  Recent Labs Lab 10/24/16 0620 10/24/16 1316 10/25/16 1025 10/26/16 0416 10/27/16 0440  NA 136 136 136 138 138  K 3.7 3.9 3.8 4.7 3.9  CL 101 101 101 106 106  CO2 26 26 25 26 26   GLUCOSE  117* 113* 117* 162* 119*  BUN 18 14 12 9 15   CREATININE 0.70 0.80 0.75 0.71 0.90  CALCIUM 8.5* 8.6* 8.5* 8.2* 8.0*   GFR: Estimated Creatinine Clearance: 64.1 mL/min (by C-G formula based on SCr of 0.9 mg/dL). Liver Function Tests:  Recent Labs Lab 10/23/16 1450 10/24/16 1316 10/27/16 0440  AST 33 29 29  ALT 21 21 21   ALKPHOS 154* 127* 102  BILITOT 1.3* 0.6 0.4  PROT 8.0 6.2* 5.1*  ALBUMIN 3.6 3.0* 2.0*   No results for input(s): LIPASE, AMYLASE in the last 168 hours. No results for input(s): AMMONIA in the last 168 hours. Coagulation Profile:  Recent Labs Lab 10/24/16 1316 10/25/16 0147  INR 1.15 1.17   Cardiac Enzymes:  Recent Labs Lab 10/23/16 1450 10/24/16 1316 10/25/16 0147  TROPONINI <0.03 0.03* <0.03   BNP (last 3 results) No results for input(s): PROBNP in the last 8760 hours. HbA1C: No results for input(s): HGBA1C in the last 72 hours. CBG:  Recent Labs Lab 10/26/16 1707 10/26/16 2348 10/27/16 0511 10/27/16 0749 10/27/16 1123  GLUCAP 148* 128* 148* 91 148*   Lipid Profile: No results for input(s): CHOL, HDL, LDLCALC, TRIG, CHOLHDL, LDLDIRECT in the last 72 hours. Thyroid Function Tests: No results for input(s): TSH, T4TOTAL, FREET4, T3FREE, THYROIDAB in the last 72 hours. Anemia Panel: No results for input(s): VITAMINB12, FOLATE, FERRITIN, TIBC, IRON, RETICCTPCT in the last 72 hours. Urine analysis:    Component Value Date/Time   COLORURINE AMBER (A) 10/24/2016 1830   APPEARANCEUR CLOUDY (A) 10/24/2016 1830   LABSPEC 1.038 (H) 10/24/2016 1830   PHURINE 5.5 10/24/2016 1830   GLUCOSEU NEGATIVE 10/24/2016 1830   HGBUR NEGATIVE 10/24/2016 1830   BILIRUBINUR NEGATIVE 10/24/2016 1830   KETONESUR NEGATIVE 10/24/2016 1830   PROTEINUR 30 (A) 10/24/2016 1830   UROBILINOGEN 0.2 09/22/2007 1157   NITRITE NEGATIVE 10/24/2016 1830   LEUKOCYTESUR LARGE (A) 10/24/2016 1830   Sepsis Labs: Invalid input(s): PROCALCITONIN, LACTICIDVEN  Recent  Results (from the past 240 hour(s))  Urine culture     Status: None   Collection Time: 10/25/16 10:44 AM  Result Value Ref Range Status   Specimen Description URINE, RANDOM  Final   Special Requests NONE  Final   Culture NO GROWTH  Final   Report Status 10/26/2016 FINAL  Final  Gram stain     Status: None   Collection Time: 10/25/16  1:55 PM  Result Value Ref Range Status   Specimen Description FLUID LEFT PLEURAL  Final   Special Requests NONE  Final   Gram Stain   Final    CYTOSPIN SMEAR WBC PRESENT,BOTH  PMN AND MONONUCLEAR NO ORGANISMS SEEN    Report Status 10/25/2016 FINAL  Final  Culture, body fluid-bottle     Status: None (Preliminary result)   Collection Time: 10/25/16  1:55 PM  Result Value Ref Range Status   Specimen Description FLUID PLEURAL LEFT  Final   Special Requests BOTTLES DRAWN AEROBIC AND ANAEROBIC 10CC  Final   Culture NO GROWTH < 24 HOURS  Final   Report Status PENDING  Incomplete  Culture, respiratory (NON-Expectorated)     Status: None   Collection Time: 10/25/16  2:10 PM  Result Value Ref Range Status   Specimen Description BRONCHIAL WASHINGS LEFT  Final   Special Requests PATIENT ON FOLLOWING  VANCY  Final   Gram Stain   Final    RARE WBC PRESENT, PREDOMINANTLY MONONUCLEAR RARE GRAM POSITIVE COCCI IN PAIRS AND CHAINS    Culture Consistent with normal respiratory flora.  Final   Report Status 10/27/2016 FINAL  Final  MRSA PCR Screening     Status: None   Collection Time: 10/26/16  4:12 AM  Result Value Ref Range Status   MRSA by PCR NEGATIVE NEGATIVE Final    Comment:        The GeneXpert MRSA Assay (FDA approved for NASAL specimens only), is one component of a comprehensive MRSA colonization surveillance program. It is not intended to diagnose MRSA infection nor to guide or monitor treatment for MRSA infections.       Radiology Studies: Dg Chest Port 1 View  Result Date: 10/27/2016 CLINICAL DATA:  Pleural and pericardial effusions.  Left chest tubes. EXAM: PORTABLE CHEST 1 VIEW COMPARISON:  10/26/2016 and 10/25/2016 and chest CT dated 10/23/2016 FINDINGS: Two left-sided chest tubes remain in good position with no pneumothorax. Slight residual atelectasis in the left midzone with possibly some fluid along the fissure. Minimal atelectasis at the right lung base. Heart size and vascularity are normal. Central venous catheter tip is in the upper superior vena cava. The persistent enlargement of the cardiac silhouette consistent with the pericardial effusion present on 10/23/2016. IMPRESSION: No residual pneumothorax. Slight residual atelectasis and/or fluid in the left midzone. Electronically Signed   By: Lorriane Shire M.D.   On: 10/27/2016 08:42   Dg Chest Port 1 View  Result Date: 10/26/2016 CLINICAL DATA:  Left-sided pneumothorax with chest tube treatment EXAM: PORTABLE CHEST 1 VIEW COMPARISON:  Portable chest x-ray of October 25, 2016 FINDINGS: A tiny left apical pleural line is visible consistent with a less than 5% pneumothorax. The 2 chest tubes are in stable position. There is considerable interstitial and alveolar density in the left mid and lower lung with partial obscuration of the hemidiaphragm. There is minimal atelectasis or early interstitial infiltrate at the right lung base also. The cardiac silhouette where visualized appears enlarged. The pulmonary vascularity is not engorged. The mediastinum is normal in width. IMPRESSION: Less than 5% left apical pneumothorax persists. Considerable atelectasis or pneumonia in the left mid and lower lung persists. Mild atelectasis at the right lung base. No pleural effusion. Cardiomegaly without significant pulmonary vascular congestion. The 3 chest tubes are in stable position. Electronically Signed   By: David  Martinique M.D.   On: 10/26/2016 07:38   Dg Chest Port 1 View  Result Date: 10/25/2016 CLINICAL DATA:  Postoperative, evaluate for pneumothorax EXAM: PORTABLE CHEST 1 VIEW  COMPARISON:  10/23/2016 chest radiograph. FINDINGS: Two left upper chest tubes are in place. Left internal jugular central venous catheter terminates in the upper third of the  superior vena cava. Stable cardiomediastinal silhouette with normal heart size. No pneumothorax. No right pleural effusion. Persistent extensive patchy consolidation in the mid to lower left lung. IMPRESSION: 1. No pneumothorax.  Two left upper chest tubes are in place. 2. Persistent extensive patchy consolidation in the mid to lower left lung, favor pneumonia and atelectasis, with a residual loculated left pleural effusion component or underlying lung mass not excluded. Electronically Signed   By: Ilona Sorrel M.D.   On: 10/25/2016 15:49     Scheduled Meds: . acetaminophen  1,000 mg Oral Q6H   Or  . acetaminophen (TYLENOL) oral liquid 160 mg/5 mL  1,000 mg Oral Q6H  . aspirin  325 mg Oral Daily  . bisacodyl  10 mg Oral Daily  . enoxaparin (LOVENOX) injection  40 mg Subcutaneous Q24H  . escitalopram  20 mg Oral QHS  . fentaNYL   Intravenous Q4H  . insulin aspart  0-15 Units Subcutaneous TID WC  . piperacillin-tazobactam (ZOSYN)  IV  3.375 g Intravenous Q8H  . senna-docusate  1 tablet Oral QHS  . vancomycin  1,000 mg Intravenous Q12H   Continuous Infusions: . dextrose 5 % and 0.9 % NaCl with KCl 20 mEq/L 10 mL/hr at 10/27/16 0804    Marzetta Board, MD, PhD [facility] Pager [phone]  If 7PM-7AM, please contact night-coverage [email or web address] Password [email or web address] 10/27/2016, 2:25 PM

## 2016-10-27 NOTE — Care Management Important Message (Signed)
Important Message  Patient Details  Name: Andrea Martinez MRN: [id number] Date of Birth: 13-Apr-1946   Medicare Important Message Given:  Yes    [name] 10/27/2016, 10:20 AM

## 2016-10-28 ENCOUNTER — Inpatient Hospital Stay (HOSPITAL_COMMUNITY): Payer: Medicare Other

## 2016-10-28 LAB — GLUCOSE, CAPILLARY
GLUCOSE-CAPILLARY: 109 mg/dL — AB (ref 65–99)
GLUCOSE-CAPILLARY: 95 mg/dL (ref 65–99)
Glucose-Capillary: 99 mg/dL (ref 65–99)
Glucose-Capillary: 132 mg/dL — ABNORMAL HIGH (ref 65–99)

## 2016-10-28 LAB — CBC
HEMATOCRIT: 31 % — AB (ref 36.0–46.0)
HEMOGLOBIN: 9.7 g/dL — AB (ref 12.0–15.0)
MCH: 27.2 pg (ref 26.0–34.0)
MCHC: 31.3 g/dL (ref 30.0–36.0)
MCV: 86.8 fL (ref 78.0–100.0)
Platelets: 378 10*3/uL (ref 150–400)
RBC: 3.57 MIL/uL — ABNORMAL LOW (ref 3.87–5.11)
RDW: 13.5 % (ref 11.5–15.5)
WBC: 13.6 10*3/uL — ABNORMAL HIGH (ref 4.0–10.5)

## 2016-10-28 LAB — BASIC METABOLIC PANEL
ANION GAP: 5 (ref 5–15)
BUN: 11 mg/dL (ref 6–20)
CALCIUM: 7.9 mg/dL — AB (ref 8.9–10.3)
CHLORIDE: 107 mmol/L (ref 101–111)
CO2: 27 mmol/L (ref 22–32)
Creatinine, Ser: 0.79 mg/dL (ref 0.44–1.00)
GFR calc Af Amer: >60 mL/min (ref >60)
GFR calc non Af Amer: >60 mL/min (ref >60)
GLUCOSE: 96 mg/dL (ref 65–99)
Potassium: 3.8 mmol/L (ref 3.5–5.1)
Sodium: 139 mmol/L (ref 135–145)

## 2016-10-28 LAB — TSH: TSH: 24.319 u[IU]/mL — ABNORMAL HIGH (ref 0.350–4.500)

## 2016-10-28 MED ORDER — OXYCODONE HCL 5 MG PO TABS
5.0000 mg | ORAL_TABLET | ORAL | Status: DC | PRN
  Administered 2016-10-30 – 2016-11-04 (×21): 5 mg via ORAL
  Filled 2016-10-28 (×24): qty 1

## 2016-10-28 MED ORDER — LEVOTHYROXINE SODIUM 25 MCG PO TABS
25.0000 ug | ORAL_TABLET | Freq: Every day | ORAL | Status: DC
  Administered 2016-10-29 – 2016-11-04 (×7): 25 ug via ORAL
  Filled 2016-10-28 (×7): qty 1

## 2016-10-28 MED ORDER — POTASSIUM CHLORIDE CRYS ER 20 MEQ PO TBCR
20.0000 meq | EXTENDED_RELEASE_TABLET | Freq: Once | ORAL | Status: AC
  Administered 2016-10-28: 20 meq via ORAL
  Filled 2016-10-28: qty 1

## 2016-10-28 NOTE — Progress Notes (Signed)
PROGRESS NOTE  Andrea Martinez [id number] DOB: May 26, 1946 DOA: 10/23/2016 PCP: [name], MD   LOS: 5 days   Brief Narrative: Andrea Martinez a 70 y.o.femalewith a history of CML on chemotherapy, hypothyroidism, leukocytosis. She presents with 3 days of worsening shortness of breath acutely worse today. She is dyspneic on exertion to 10 of 15 feet. Improves at rest and on oxygen. Patient even has dyspnea with getting out of bed. Due to worsening symptoms, the patient presents to the hospital for evaluation  Assessment & Plan: Principal Problem:   Pleural effusion Active Problems:   Chronic myelogenous leukemia (CML), BCR-ABL1-positive (HCC)   Solitary pulmonary nodule   Left Pleural effusion, pericardial effusion.  - CVTS consulted, patient underwent Video-bronchoscopy left, drainage left pleural effusion, drainage of pericardial fluid  Window. S/P biopsy of pleural, pericardium 11-13. Tube management per TCV - Concern with para-pneumonic effusion, biopsy pending. Bronchial lavage culture; Growing rare gram positive cocci in chains and pair. Started Vancomycin and Zosyn. Follow culture.   Chest pain - Troponin negative. ECHO EF 55 %, in the setting of #1 - now has chest pain at the chest tube site  Hypothyroidism - Not on medications, she tells me that she used to be on Armour but taken off, she cannot tell me more details.  - TSH with significant elevation to 24, start low dose synthroid as treatment is indicated   Leukocytosis - UA with too numerous to count WBC. Urine culture ordered.  - Bronchial washing  Growing rare gram positive cocci in chains and pair. Started Vancomycin and Zosyn. Follow cultures, still pending today  CML - Hold chemotherapy for now, will defer to Dr. Whitney Muse when this can be resumed in outpatient setting  Solitary Pulmonary Nodule - will need follow up CT per Dr. Luan Pulling   DVT prophylaxis: Lovenox Code Status: Full Family  Communication: daughter bedside Disposition Plan: remain in SDU  Consultants:   Cardiothoracic surgery   Procedures:   Echo  VATS, bronchoscopy, pericardial window  Antimicrobials:  Vancomycin 11/13 >>  Zosyn 11/13 >>   Subjective: - Complains of chest soreness of the tube site. No breathing difficulties.  Objective: Vitals:   10/27/16 2332 10/28/16 0335 10/28/16 0737 10/28/16 1148  BP: (!) 138/56 (!) 116/46 (!) 130/55 (!) 118/51  Pulse: 86 80 81 67  Resp: 19 18 19 18   Temp: 98.6 F (37 C) 98.8 F (37.1 C) 98.2 F (36.8 C) 98.1 F (36.7 C)  TempSrc: Oral Oral Oral Oral  SpO2: 97% 95% 96% 96%  Weight:      Height:        Intake/Output Summary (Last 24 hours) at 10/28/16 1221 Last data filed at 10/28/16 0600  Gross per 24 hour  Intake           912.67 ml  Output               80 ml  Net           832.67 ml   Filed Weights   10/23/16 1415  Weight: 88 kg (194 lb)    Examination: Constitutional: NAD Vitals:   10/27/16 2332 10/28/16 0335 10/28/16 0737 10/28/16 1148  BP: (!) 138/56 (!) 116/46 (!) 130/55 (!) 118/51  Pulse: 86 80 81 67  Resp: 19 18 19 18   Temp: 98.6 F (37 C) 98.8 F (37.1 C) 98.2 F (36.8 C) 98.1 F (36.7 C)  TempSrc: Oral Oral Oral Oral  SpO2: 97% 95% 96% 96%  Weight:  Height:       Eyes: PERRL ENMT: Mucous membranes are moist.  Neck: normal, supple, no masses, no thyromegaly Respiratory: clear to auscultation bilaterally, no wheezing, no crackles. Chest tubes in place Cardiovascular: Regular rate and rhythm, no murmurs / rubs / gallops.  Abdomen: no tenderness. Bowel sounds positive.   Data Reviewed: I have personally reviewed following labs and imaging studies  CBC:  Recent Labs Lab 10/23/16 1450  10/24/16 1316 10/25/16 1025 10/26/16 0416 10/27/16 0440 10/28/16 0456  WBC 18.2*  < > 14.0* 11.8* 22.0* 15.4* 13.6*  NEUTROABS 14.2*  --   --   --   --   --   --   HGB 12.1  < > 10.9* 10.3* 11.2* 9.8* 9.7*  HCT 36.8  <  > 33.9* 33.0* 35.3* 31.7* 31.0*  MCV 88.0  < > 86.9 87.1 88.0 88.1 86.8  PLT 381  < > 336 315 328 323 378  < > = values in this interval not displayed. Basic Metabolic Panel:  Recent Labs Lab 10/24/16 1316 10/25/16 1025 10/26/16 0416 10/27/16 0440 10/28/16 0456  NA 136 136 138 138 139  K 3.9 3.8 4.7 3.9 3.8  CL 101 101 106 106 107  CO2 26 25 26 26 27   GLUCOSE 113* 117* 162* 119* 96  BUN 14 12 9 15 11   CREATININE 0.80 0.75 0.71 0.90 0.79  CALCIUM 8.6* 8.5* 8.2* 8.0* 7.9*   GFR: Estimated Creatinine Clearance: 72.1 mL/min (by C-G formula based on SCr of 0.79 mg/dL). Liver Function Tests:  Recent Labs Lab 10/23/16 1450 10/24/16 1316 10/27/16 0440  AST 33 29 29  ALT 21 21 21   ALKPHOS 154* 127* 102  BILITOT 1.3* 0.6 0.4  PROT 8.0 6.2* 5.1*  ALBUMIN 3.6 3.0* 2.0*   No results for input(s): LIPASE, AMYLASE in the last 168 hours. No results for input(s): AMMONIA in the last 168 hours. Coagulation Profile:  Recent Labs Lab 10/24/16 1316 10/25/16 0147  INR 1.15 1.17   Cardiac Enzymes:  Recent Labs Lab 10/23/16 1450 10/24/16 1316 10/25/16 0147  TROPONINI <0.03 0.03* <0.03   BNP (last 3 results) No results for input(s): PROBNP in the last 8760 hours. HbA1C: No results for input(s): HGBA1C in the last 72 hours. CBG:  Recent Labs Lab 10/27/16 0749 10/27/16 1123 10/27/16 1657 10/27/16 2159 10/28/16 1146  GLUCAP 91 148* 101* 111* 109*   Lipid Profile: No results for input(s): CHOL, HDL, LDLCALC, TRIG, CHOLHDL, LDLDIRECT in the last 72 hours. Thyroid Function Tests:  Recent Labs  10/28/16 0456  TSH 24.319*   Anemia Panel: No results for input(s): VITAMINB12, FOLATE, FERRITIN, TIBC, IRON, RETICCTPCT in the last 72 hours. Urine analysis:    Component Value Date/Time   COLORURINE AMBER (A) 10/24/2016 1830   APPEARANCEUR CLOUDY (A) 10/24/2016 1830   LABSPEC 1.038 (H) 10/24/2016 1830   PHURINE 5.5 10/24/2016 1830   GLUCOSEU NEGATIVE 10/24/2016 1830     HGBUR NEGATIVE 10/24/2016 1830   BILIRUBINUR NEGATIVE 10/24/2016 1830   KETONESUR NEGATIVE 10/24/2016 1830   PROTEINUR 30 (A) 10/24/2016 1830   UROBILINOGEN 0.2 09/22/2007 1157   NITRITE NEGATIVE 10/24/2016 1830   LEUKOCYTESUR LARGE (A) 10/24/2016 1830   Sepsis Labs: Invalid input(s): PROCALCITONIN, LACTICIDVEN  Recent Results (from the past 240 hour(s))  Urine culture     Status: None   Collection Time: 10/25/16 10:44 AM  Result Value Ref Range Status   Specimen Description URINE, RANDOM  Final   Special Requests NONE  Final  Culture NO GROWTH  Final   Report Status 10/26/2016 FINAL  Final  Gram stain     Status: None   Collection Time: 10/25/16  1:55 PM  Result Value Ref Range Status   Specimen Description FLUID LEFT PLEURAL  Final   Special Requests NONE  Final   Gram Stain   Final    CYTOSPIN SMEAR WBC PRESENT,BOTH PMN AND MONONUCLEAR NO ORGANISMS SEEN    Report Status 10/25/2016 FINAL  Final  Culture, body fluid-bottle     Status: None (Preliminary result)   Collection Time: 10/25/16  1:55 PM  Result Value Ref Range Status   Specimen Description FLUID PLEURAL LEFT  Final   Special Requests BOTTLES DRAWN AEROBIC AND ANAEROBIC 10CC  Final   Culture NO GROWTH 2 DAYS  Final   Report Status PENDING  Incomplete  Culture, respiratory (NON-Expectorated)     Status: None   Collection Time: 10/25/16  2:10 PM  Result Value Ref Range Status   Specimen Description BRONCHIAL WASHINGS LEFT  Final   Special Requests PATIENT ON FOLLOWING  VANCY  Final   Gram Stain   Final    RARE WBC PRESENT, PREDOMINANTLY MONONUCLEAR RARE GRAM POSITIVE COCCI IN PAIRS AND CHAINS    Culture Consistent with normal respiratory flora.  Final   Report Status 10/27/2016 FINAL  Final  MRSA PCR Screening     Status: None   Collection Time: 10/26/16  4:12 AM  Result Value Ref Range Status   MRSA by PCR NEGATIVE NEGATIVE Final    Comment:        The GeneXpert MRSA Assay (FDA approved for NASAL  specimens only), is one component of a comprehensive MRSA colonization surveillance program. It is not intended to diagnose MRSA infection nor to guide or monitor treatment for MRSA infections.       Radiology Studies: Dg Chest Port 1 View  Result Date: 10/28/2016 CLINICAL DATA:  Status post removal of 2 left chest tubes yesterday. EXAM: PORTABLE CHEST 1 VIEW COMPARISON:  Portable chest x-ray of October 27, 2016 FINDINGS: The lungs are well-expanded. The interstitial markings remain increased but have improved bilaterally. There is no pneumothorax on the left. There is a small amount of residual pleural fluid layering posteriorly and laterally. One chest tube remains with its tip overlying the posterior medial aspect of the left seventh rib. There is no significant pleural effusion on the right. The cardiac silhouette remains enlarged. The central pulmonary vascularity is prominent. The left internal jugular venous catheter tip projects over the proximal SVC. IMPRESSION: No significant re-accumulation of a left pleural effusion or left-sided pneumothorax since removal of 2 of the chest tubes. The remaining left chest tube is in stable position. Stable bibasilar atelectasis with small or moderate amount of pleural fluid layering posteriorly and laterally on the left. Cardiomegaly with mild central pulmonary vascular prominence, stable. Electronically Signed   By: David  Martinique M.D.   On: 10/28/2016 09:13   Dg Chest Port 1 View  Result Date: 10/27/2016 CLINICAL DATA:  Pleural and pericardial effusions. Left chest tubes. EXAM: PORTABLE CHEST 1 VIEW COMPARISON:  10/26/2016 and 10/25/2016 and chest CT dated 10/23/2016 FINDINGS: Two left-sided chest tubes remain in good position with no pneumothorax. Slight residual atelectasis in the left midzone with possibly some fluid along the fissure. Minimal atelectasis at the right lung base. Heart size and vascularity are normal. Central venous catheter tip  is in the upper superior vena cava. The persistent enlargement of the cardiac silhouette  consistent with the pericardial effusion present on 10/23/2016. IMPRESSION: No residual pneumothorax. Slight residual atelectasis and/or fluid in the left midzone. Electronically Signed   By: Lorriane Shire M.D.   On: 10/27/2016 08:42     Scheduled Meds: . acetaminophen  1,000 mg Oral Q6H   Or  . acetaminophen (TYLENOL) oral liquid 160 mg/5 mL  1,000 mg Oral Q6H  . aspirin  325 mg Oral Daily  . bisacodyl  10 mg Oral Daily  . enoxaparin (LOVENOX) injection  40 mg Subcutaneous Q24H  . escitalopram  20 mg Oral QHS  . insulin aspart  0-15 Units Subcutaneous TID WC  . [START ON 10/29/2016] levothyroxine  25 mcg Oral QAC breakfast  . piperacillin-tazobactam (ZOSYN)  IV  3.375 g Intravenous Q8H  . senna-docusate  1 tablet Oral QHS  . vancomycin  1,000 mg Intravenous Q12H   Continuous Infusions: . dextrose 5 % and 0.9 % NaCl with KCl 20 mEq/L 10 mL/hr at 10/27/16 0804    Marzetta Board, MD, PhD [facility] Pager [phone]  If 7PM-7AM, please contact night-coverage [email or web address] Password [email or web address] 10/28/2016, 12:21 PM

## 2016-10-28 NOTE — Discharge Instructions (Signed)
Thoracotomy, Care After ° °This sheet gives you information about how to care for yourself after your procedure. Your doctor may also give you more specific instructions. If you have problems or questions, contact your doctor. °Follow these instructions at home: °Preventing lung infection (  °pneumonia) °· Take deep breaths or do breathing exercises as told by your doctor. °· Cough often. Coughing is important to clear thick spit (phlegm) and open your lungs. If coughing hurts, hold a pillow against your chest or place both hands flat on top of your cut (splinting) when you cough. This may help with discomfort. °· Use an incentive spirometer as told. This is a tool that measures how well you fill your lungs with each breath. °· Do lung therapy (pulmonary rehabilitation) as told. °Medicines °· Take over-the-counter or prescription medicines only as told by your doctor. °· If you have pain, take pain-relieving medicine before your pain gets very bad. This will help you breathe and cough more comfortably. °· If you were prescribed an antibiotic medicine, take it as told by your doctor. Do not stop taking the antibiotic even if you start to feel better. °Activity °· Ask your doctor what activities are safe for you. °· Do not travel by airplane for 2 weeks after your chest tube is removed, or until your doctor says that this is safe. °· Do not lift anything that is heavier than 10 lb (4.5 kg), or the limit that your doctor tells you, until he or she says that it is safe. °· Do not drive until your doctor approves. °¨ Do not drive or use heavy machinery while taking prescription pain medicine. °Incision care °· Follow instructions from your doctor about how to take care of your cut from surgery (incision). Make sure you: °¨ Wash your hands with soap and water before you change your bandage (dressing). If you cannot use soap and water, use hand sanitizer. °¨ Change your bandage as told by your doctor. °¨ Leave stitches  (sutures), skin glue, or skin tape (adhesive) strips in place. They may need to stay in place for 2 weeks or longer. If tape strips get loose and curl up, you may trim the loose edges. Do not remove tape strips completely unless your doctor says it is okay. °· Keep your bandage dry. °· Check your cut from surgery every day for signs of infection. Check for: °¨ More redness, swelling, or pain. °¨ More fluid or blood. °¨ Warmth. °¨ Pus or a bad smell. °Bathing °· Do not take baths, swim, or use a hot tub until your doctor approves. You may take showers. °· After your bandage has been removed, use soap and water to gently wash your cut from surgery. Do not use anything else to clean your cut unless your doctor tells you to. °Eating and drinking °· Eat a healthy diet as told by your doctor. A healthy diet includes: °¨ Fresh fruits and vegetables. °¨ Whole grains. °¨ Low-fat (lean) proteins. °· Drink enough fluid to keep your pee (urine) clear or pale yellow. °General instructions °· To prevent or treat trouble pooping (constipation) while you are taking prescription pain medicine, your doctor may recommend that you: °¨ Take over-the-counter or prescription medicines. °¨ Eat foods that are high in fiber. These include fresh fruits and vegetables, whole grains, and beans. °¨ Limit foods that are high in fat and processed sugars, such as fried and sweet foods. °· Do not use any products that contain nicotine or tobacco. These   include cigarettes and e-cigarettes. If you need help quitting, ask your doctor. °· Avoid secondhand smoke. °· Wear compression stockings as told. These help to prevent blood clots and reduce swelling in your legs. °· If you have a chest tube, care for it as told. °· Keep all follow-up visits as told by your doctor. This is important. °Contact a doctor if: °· You have more redness, swelling, or pain around your cut from surgery. °· You have more fluid or blood coming from your cut from  surgery. °· Your cut from surgery feels warm to the touch. °· You have pus or a bad smell coming from your cut from surgery. °· You have a fever or chills. °· Your heartbeat seems uneven. °· You feel sick to your stomach (nauseous). °· You throw up (vomit). °· You have muscle aches. °· You have trouble pooping (having a bowel movement). This may mean that you: °¨ Poop fewer times in a week than normal. °¨ Have a hard time pooping. °¨ Have poop that is dry, hard, or bigger than normal. °Get help right away if: °· You get a rash. °· You feel light-headed. °· You feel like you might pass out (faint). °· You are short of breath. °· You have trouble breathing. °· You are confused. °· You have trouble talking. °· You have problems with your seeing (vision). °· You are not able to move. °· You lose feeling (have numbness) in your: °¨ Face. °¨ Arms. °¨ Legs. °· You pass out. °· You have a sudden, bad headache. °· You feel weak. °· You have chest pain. °· You have pain that: °¨ Is very bad. °¨ Gets worse, even with medicine. °Summary °· Take deep breaths, do breathing exercises, and cough often. This helps prevent lung infection (pneumonia). °· Do not drive until your doctor approves. Do not travel by airplane for 2 weeks after your chest tube is removed, or until your doctor says that this is safe. °· Check your cut from surgery every day for signs of infection. °· Eat a healthy diet. This includes fresh fruits and vegetables, whole grains, and low-fat (lean) proteins. °This information is not intended to replace advice given to you by your health care provider. Make sure you discuss any questions you have with your health care provider. °Document Released: 05/30/2012 Document Revised: 08/23/2016 Document Reviewed: 08/23/2016 °Elsevier Interactive Patient Education © 2017 [employer] ° °

## 2016-10-28 NOTE — Progress Notes (Addendum)
      [address]       [place] [address]             [phone]       3 Days Post-Op Procedure(s) (LRB): VIDEO ASSISTED THORACOSCOPY (VATS), BRONCH,DRAINAGE OF PLEURAL EFFUSION,PERICARDIAL WINDOW WITH DRAINAGE OF PERICARDIAL FLUID, TEE (Left) VIDEO BRONCHOSCOPY (N/A)  Subjective: Patient with dry lips and pain at chest tube site  Objective: Vital signs in last 24 hours: Temp:  [97.7 F (36.5 C)-98.8 F (37.1 C)] 98.2 F (36.8 C) (11/16 0737) Pulse Rate:  [80-101] 81 (11/16 0737) Cardiac Rhythm: Normal sinus rhythm (11/16 0706) Resp:  [18-19] 19 (11/16 0737) BP: (116-139)/(46-67) 130/55 (11/16 0737) SpO2:  [95 %-100 %] 96 % (11/16 0737)     Intake/Output from previous day: 11/15 0701 - 11/16 0700 In: 1152.7 [P.O.:480; I.V.:322.7; IV Piggyback:350] Out: 380 [Urine:300; Chest Tube:80]   Physical Exam:  Cardiovascular: RRR. Pulmonary: Coarse on the left. Abdomen: Soft, non tender, bowel sounds present.. Wounds: Clean and dry.  No erythema or signs of infection. Chest Tube: no air leak.  Lab Results: CBC: Recent Labs  10/27/16 0440 10/28/16 0456  WBC 15.4* 13.6*  HGB 9.8* 9.7*  HCT 31.7* 31.0*  PLT 323 378   BMET:  Recent Labs  10/27/16 0440 10/28/16 0456  NA 138 139  K 3.9 3.8  CL 106 107  CO2 26 27  GLUCOSE 119* 96  BUN 15 11  CREATININE 0.90 0.79  CALCIUM 8.0* 7.9*    PT/INR: No results for input(s): LABPROT, INR in the last 72 hours. ABG:  INR: Will add last result for INR, ABG once components are confirmed Will add last 4 CBG results once components are confirmed  Assessment/Plan:  1. CV - SR in the 80's this am. 2.  Pulmonary - Chest tube output 80 cc last 24 hours. CXR this am appears stable. No air leak. Will remove chest tube.Encourage incentive spirometer and flutter valve. 3. ID-on Vanco and Zosyn. Culture of left pleural fluid shows no growth to date. Pericardial fluid showed non specific inflammation. WBC  decreased from 15,400 to 13,600 4. Anemia-H and H stable at 9.7 and 31. 5. Supplement potassium 6. Stop PCA after chest tube removed. 7. Management per medicine  Andrea Martinez,Andrea Martinez MPA-C   10/28/2016,8:05 AM  Patient seen and examined, agree with above Needs to ambulate Minimal drainage from CT- dc CXR looks good for post decortication PATH- chronic inflammation, no malignancy  Andrea Lipps C. Roxan Hockey, MD [facility] [phone]

## 2016-10-28 NOTE — Progress Notes (Signed)
Wasted 56mls of fentanyl PCA in sink Austin Miles RN to witness waste

## 2016-10-28 NOTE — Progress Notes (Signed)
Last remaining chest tube and IJ removed per MD order. New PIV established. Will continue to monitor

## 2016-10-29 ENCOUNTER — Inpatient Hospital Stay (HOSPITAL_COMMUNITY): Payer: Medicare Other

## 2016-10-29 DIAGNOSIS — D72829 Elevated white blood cell count, unspecified: Secondary | ICD-10-CM

## 2016-10-29 LAB — GLUCOSE, CAPILLARY
GLUCOSE-CAPILLARY: 113 mg/dL — AB (ref 65–99)
Glucose-Capillary: 97 mg/dL (ref 65–99)
Glucose-Capillary: 129 mg/dL — ABNORMAL HIGH (ref 65–99)
Glucose-Capillary: 141 mg/dL — ABNORMAL HIGH (ref 65–99)

## 2016-10-29 LAB — CBC
HEMATOCRIT: 32.8 % — AB (ref 36.0–46.0)
HEMOGLOBIN: 10.2 g/dL — AB (ref 12.0–15.0)
MCH: 27.3 pg (ref 26.0–34.0)
MCHC: 31.1 g/dL (ref 30.0–36.0)
MCV: 87.9 fL (ref 78.0–100.0)
Platelets: 389 10*3/uL (ref 150–400)
RBC: 3.73 MIL/uL — ABNORMAL LOW (ref 3.87–5.11)
RDW: 13.9 % (ref 11.5–15.5)
WBC: 10.2 10*3/uL (ref 4.0–10.5)

## 2016-10-29 LAB — BASIC METABOLIC PANEL
ANION GAP: 7 (ref 5–15)
BUN: 8 mg/dL (ref 6–20)
CHLORIDE: 107 mmol/L (ref 101–111)
CO2: 29 mmol/L (ref 22–32)
Calcium: 8.2 mg/dL — ABNORMAL LOW (ref 8.9–10.3)
Creatinine, Ser: 0.78 mg/dL (ref 0.44–1.00)
GFR calc Af Amer: >60 mL/min (ref >60)
GLUCOSE: 89 mg/dL (ref 65–99)
POTASSIUM: 4.1 mmol/L (ref 3.5–5.1)
SODIUM: 143 mmol/L (ref 135–145)

## 2016-10-29 LAB — HEMOGLOBIN A1C
Hgb A1c MFr Bld: 5.3 % (ref 4.8–5.6)
Mean Plasma Glucose: 105 mg/dL

## 2016-10-29 NOTE — Progress Notes (Signed)
PROGRESS NOTE    Andrea Martinez  [id number] DOB: 1946-09-28 DOA: 10/23/2016 PCP: [name], MD  Brief Narrative:  Andrea Martinez a 70 y.o.femalewith a history of CML on chemotherapy, hypothyroidism, leukocytosis. She presents with 3 days of worsening shortness of breath acutely worse today. She wasdyspneic on exertion to 10 of 15 feet. Improves at rest and on oxygen. Patient even has dyspnea with getting out of bed. Due to worsening symptoms, the patient presented to the hospital for evaluation and was found to have large Left Pleural Effusion and Pericardial Effusion that is now s/p VATS with Drainage, and Drainage of Pericardial Fluid with Window and Biopsy of Pericardium.   Assessment & Plan:   Principal Problem:   Pleural effusion Active Problems:   Chronic myelogenous leukemia (CML), BCR-ABL1-positive (HCC)   Solitary pulmonary nodule  Left Pleural effusion, pericardial effusion s/p VATS, Drainage of Effusion, Pericardial Window, and Biopsy  - CVTS consulted, patient underwent Video-bronchoscopy left, drainage left pleural effusion, drainage of pericardial fluid Window. S/P biopsy of pleural, pericardium 11-13. Tube management per TCV - Removed all tubes - Concern with para-pneumonic effusion, biopsy pending. Bronchial lavage culture; Growing rare gram positive cocci in chains and pair.  - Continue Vancomycin and Zosyn. Follow culture.  - PT to Evaluate and Treat  Chest pain - Troponin negative. ECHO EF 55 %, in the setting of #1 - Now has chest discomfort/pain where Chest Tubes used to be - Pain Control with Oxy IR 5 mg po q4hprn, Tramadol 50 mg po q6hprn  Hypothyroidism - Not on medications. - TSH with significant elevation to 24.319 - Will obtain Free T4 - start low dose Levothyroxine at 25 mcg daily  Leukocytosis, improved - WBC went from 13.6 -> 10.2 - UA with too numerous to count WBC. Urine culture ordered.  - Bronchial washing Growing rare gram  positive cocci in chains and pair.  - C/w IV Vancomycin and Zosyn. Follow cultures, still pending today  CML - Hold chemotherapy for now, will defer to Dr. Whitney Muse when this can be resumed in outpatient setting  Solitary Pulmonary Nodule - will need follow up CT per Dr. Luan Pulling  DVT prophylaxis: Lovenox Code Status: FULL Family Communication: Discussed with Husband at Bedside Disposition Plan: Transfer from SDU to [employer] Floor with Telemetry  Consultants:   Cardiothroacic Surgery  Procedures:  VATS, bronchoscopy, pericardial window and biopsy Echocardiogram  Antimicrobials: IV Vancomycin and IV Zosyn 11/13 -->  Subjective: Seen and examined this AM and was complaining of Nausea. Had some chest soreness from where chest tubes used to be. No Vomiting. Poor appetite. No other concerns or complaints at this time.   Objective: Vitals:   10/29/16 0700 10/29/16 1103 10/29/16 1458 10/29/16 1929  BP: (!) 119/47 (!) 131/56 (!) 131/49 (!) 120/43  Pulse: 67 67 72 73  Resp: 16 19 14 19   Temp: 97.8 F (36.6 C) 97.5 F (36.4 C) 98 F (36.7 C) 98 F (36.7 C)  TempSrc: Oral Oral Oral Oral  SpO2: 97% 100% 97% 95%  Weight:      Height:        Intake/Output Summary (Last 24 hours) at 10/29/16 2034 Last data filed at 10/29/16 0900  Gross per 24 hour  Intake               60 ml  Output                0 ml  Net  60 ml   Filed Weights   10/23/16 1415  Weight: 88 kg (194 lb)    Examination: Physical Exam:  Constitutional: WN/WD, NAD and appears calm and comfortable Eyes:  Lids and conjunctivae normal, sclerae anicteric  ENMT: External Ears, Nose appear normal. Grossly normal hearing.  Neck: Appears normal, supple, no cervical masses, normal ROM, no appreciable thyromegaly Respiratory: Clear to auscultation bilaterally, no wheezing, rales, rhonchi or crackles. Normal respiratory effort and patient is not tachypenic. No accessory muscle use. Chest Tubes  removed.  Cardiovascular: RRR, no murmurs / rubs / gallops. S1 and S2 auscultated. No extremity edema.  Abdomen: Soft, non-tender, non-distended. No masses palpated. No appreciable hepatosplenomegaly. Bowel sounds positive x4.  GU: Deferred. Musculoskeletal: No clubbing / cyanosis of digits/nails. No joint deformity upper and lower extremities.  Skin: No rashes, lesions, ulcers. No induration; Warm and dry.  Neurologic: CN 2-12 grossly intact with no focal deficits. Sensation intact in all 4 Extremities. Romberg sign cerebellar reflexes not assessed.  Psychiatric: Normal judgment and insight. Alert and oriented x 3. Normal mood and appropriate affect.   Data Reviewed: I have personally reviewed following labs and imaging studies  CBC:  Recent Labs Lab 10/23/16 1450  10/25/16 1025 10/26/16 0416 10/27/16 0440 10/28/16 0456 10/29/16 0558  WBC 18.2*  < > 11.8* 22.0* 15.4* 13.6* 10.2  NEUTROABS 14.2*  --   --   --   --   --   --   HGB 12.1  < > 10.3* 11.2* 9.8* 9.7* 10.2*  HCT 36.8  < > 33.0* 35.3* 31.7* 31.0* 32.8*  MCV 88.0  < > 87.1 88.0 88.1 86.8 87.9  PLT 381  < > 315 328 323 378 389  < > = values in this interval not displayed. Basic Metabolic Panel:  Recent Labs Lab 10/25/16 1025 10/26/16 0416 10/27/16 0440 10/28/16 0456 10/29/16 0558  NA 136 138 138 139 143  K 3.8 4.7 3.9 3.8 4.1  CL 101 106 106 107 107  CO2 25 26 26 27 29   GLUCOSE 117* 162* 119* 96 89  BUN 12 9 15 11 8   CREATININE 0.75 0.71 0.90 0.79 0.78  CALCIUM 8.5* 8.2* 8.0* 7.9* 8.2*   GFR: Estimated Creatinine Clearance: 72.1 mL/min (by C-G formula based on SCr of 0.78 mg/dL). Liver Function Tests:  Recent Labs Lab 10/23/16 1450 10/24/16 1316 10/27/16 0440  AST 33 29 29  ALT 21 21 21   ALKPHOS 154* 127* 102  BILITOT 1.3* 0.6 0.4  PROT 8.0 6.2* 5.1*  ALBUMIN 3.6 3.0* 2.0*   No results for input(s): LIPASE, AMYLASE in the last 168 hours. No results for input(s): AMMONIA in the last 168  hours. Coagulation Profile:  Recent Labs Lab 10/24/16 1316 10/25/16 0147  INR 1.15 1.17   Cardiac Enzymes:  Recent Labs Lab 10/23/16 1450 10/24/16 1316 10/25/16 0147  TROPONINI <0.03 0.03* <0.03   BNP (last 3 results) No results for input(s): PROBNP in the last 8760 hours. HbA1C:  Recent Labs  10/28/16 0456  HGBA1C 5.3   CBG:  Recent Labs Lab 10/28/16 1700 10/28/16 2201 10/29/16 0811 10/29/16 1228 10/29/16 1630  GLUCAP 132* 95 97 129* 113*   Lipid Profile: No results for input(s): CHOL, HDL, LDLCALC, TRIG, CHOLHDL, LDLDIRECT in the last 72 hours. Thyroid Function Tests:  Recent Labs  10/28/16 0456  TSH 24.319*   Anemia Panel: No results for input(s): VITAMINB12, FOLATE, FERRITIN, TIBC, IRON, RETICCTPCT in the last 72 hours. Sepsis Labs: No results for input(s):  PROCALCITON, LATICACIDVEN in the last 168 hours.  Recent Results (from the past 240 hour(s))  Urine culture     Status: None   Collection Time: 10/25/16 10:44 AM  Result Value Ref Range Status   Specimen Description URINE, RANDOM  Final   Special Requests NONE  Final   Culture NO GROWTH  Final   Report Status 10/26/2016 FINAL  Final  Gram stain     Status: None   Collection Time: 10/25/16  1:55 PM  Result Value Ref Range Status   Specimen Description FLUID LEFT PLEURAL  Final   Special Requests NONE  Final   Gram Stain   Final    CYTOSPIN SMEAR WBC PRESENT,BOTH PMN AND MONONUCLEAR NO ORGANISMS SEEN    Report Status 10/25/2016 FINAL  Final  Culture, body fluid-bottle     Status: None (Preliminary result)   Collection Time: 10/25/16  1:55 PM  Result Value Ref Range Status   Specimen Description FLUID PLEURAL LEFT  Final   Special Requests BOTTLES DRAWN AEROBIC AND ANAEROBIC 10CC  Final   Culture NO GROWTH 4 DAYS  Final   Report Status PENDING  Incomplete  Culture, respiratory (NON-Expectorated)     Status: None   Collection Time: 10/25/16  2:10 PM  Result Value Ref Range Status    Specimen Description BRONCHIAL WASHINGS LEFT  Final   Special Requests PATIENT ON FOLLOWING  VANCY  Final   Gram Stain   Final    RARE WBC PRESENT, PREDOMINANTLY MONONUCLEAR RARE GRAM POSITIVE COCCI IN PAIRS AND CHAINS    Culture Consistent with normal respiratory flora.  Final   Report Status 10/27/2016 FINAL  Final  MRSA PCR Screening     Status: None   Collection Time: 10/26/16  4:12 AM  Result Value Ref Range Status   MRSA by PCR NEGATIVE NEGATIVE Final    Comment:        The GeneXpert MRSA Assay (FDA approved for NASAL specimens only), is one component of a comprehensive MRSA colonization surveillance program. It is not intended to diagnose MRSA infection nor to guide or monitor treatment for MRSA infections.     Radiology Studies: Dg Chest 2 View  Result Date: 10/29/2016 CLINICAL DATA:  Pneumothorax EXAM: CHEST  2 VIEW COMPARISON:  10/28/2016 FINDINGS: The left jugular central venous catheter has been removed. The left chest tube is been removed. There is no pneumothorax. Hazy and linear opacities at both lower lung zones left greater than right are stable. The lateral view demonstrates pleural based opacity anteriorly in the thorax raising the possibility of loculated pleural fluid at the anterior left base. There is some blunting of the posterior right costophrenic angle indicating a small right pleural effusion. IMPRESSION: Left chest tube is been removed.  There is no pneumothorax There is the suggestion of loculated pleural fluid at the anterior left base. Small right pleural effusion and basilar atelectasis. Airspace opacities at the left base are difficult to exclude. Electronically Signed   By: Marybelle Killings M.D.   On: 10/29/2016 11:54   Dg Chest Port 1 View  Result Date: 10/28/2016 CLINICAL DATA:  Status post removal of 2 left chest tubes yesterday. EXAM: PORTABLE CHEST 1 VIEW COMPARISON:  Portable chest x-ray of October 27, 2016 FINDINGS: The lungs are well-expanded.  The interstitial markings remain increased but have improved bilaterally. There is no pneumothorax on the left. There is a small amount of residual pleural fluid layering posteriorly and laterally. One chest tube remains with its  tip overlying the posterior medial aspect of the left seventh rib. There is no significant pleural effusion on the right. The cardiac silhouette remains enlarged. The central pulmonary vascularity is prominent. The left internal jugular venous catheter tip projects over the proximal SVC. IMPRESSION: No significant re-accumulation of a left pleural effusion or left-sided pneumothorax since removal of 2 of the chest tubes. The remaining left chest tube is in stable position. Stable bibasilar atelectasis with small or moderate amount of pleural fluid layering posteriorly and laterally on the left. Cardiomegaly with mild central pulmonary vascular prominence, stable. Electronically Signed   By: David  Martinique M.D.   On: 10/28/2016 09:13   Scheduled Meds: . acetaminophen  1,000 mg Oral Q6H   Or  . acetaminophen (TYLENOL) oral liquid 160 mg/5 mL  1,000 mg Oral Q6H  . aspirin  325 mg Oral Daily  . bisacodyl  10 mg Oral Daily  . enoxaparin (LOVENOX) injection  40 mg Subcutaneous Q24H  . escitalopram  20 mg Oral QHS  . insulin aspart  0-15 Units Subcutaneous TID WC  . levothyroxine  25 mcg Oral QAC breakfast  . piperacillin-tazobactam (ZOSYN)  IV  3.375 g Intravenous Q8H  . senna-docusate  1 tablet Oral QHS  . vancomycin  1,000 mg Intravenous Q12H   Continuous Infusions: . dextrose 5 % and 0.9 % NaCl with KCl 20 mEq/L 10 mL/hr at 10/27/16 0804    LOS: 6 days   Kerney Elbe, DO [facility] Pager [phone]  If 7PM-7AM, please contact night-coverage [email or web address] Password [facility] 10/29/2016, 8:34 PM

## 2016-10-29 NOTE — Progress Notes (Signed)
Pharmacy Antibiotic Note  Andrea Martinez is a 70 y.o. female admitted on 10/23/2016, s/p VATS 11/13 with leukocytosis and concern for PNA.  Pharmacy was been consulted for Vancomycin and Zosyn dosing.  Renal function is stable.  WBC is improving.  Cx data negative.  Plan: Continue Vancomycin 1gm IV q12h Continue Zosyn 3.375gm IV q8h Consider d/c antibiotics or at least narrow by discontinuing Vancomycin in light of negative cultures and clinical improvement. Would recommend a Vancomycin trough 11/19 if therapy continues.  Height: 5\' 6"  (167.6 cm) Weight: 194 lb (88 kg) IBW/kg (Calculated) : 59.3  Temp (24hrs), Avg:98.1 F (36.7 C), Min:97.5 F (36.4 C), Max:98.6 F (37 C)   Recent Labs Lab 10/25/16 1025 10/26/16 0416 10/27/16 0440 10/28/16 0456 10/29/16 0558  WBC 11.8* 22.0* 15.4* 13.6* 10.2  CREATININE 0.75 0.71 0.90 0.79 0.78    Estimated Creatinine Clearance: 72.1 mL/min (by C-G formula based on SCr of 0.78 mg/dL).    Allergies  Allergen Reactions  . Codeine Nausea Only    Headache  . Esomeprazole Magnesium Nausea Only  . Sulfonamide Derivatives Nausea Only    Headache  . Fluconazole Rash    Redness and blistering on left thigh    Antimicrobials this admission: Vancomycin 11/14 >> Zosyn 11/14 >>  Dose adjustments this admission: none  Microbiology results: 11/13 Pleural fluid: ngtd 11/13 UCx: negative 11/13 Sputum: normal flora 11/14 MRSA PCR negative  Thank you for allowing pharmacy to be a part of this patient's care.  [employer], Pharm.D., BCPS Clinical Pharmacist Pager [phone] 10/29/2016 3:51 PM

## 2016-10-29 NOTE — Progress Notes (Addendum)
[address]       [place] [address]             [phone]      4 Days Post-Op Procedure(s) (LRB): VIDEO ASSISTED THORACOSCOPY (VATS), BRONCH,DRAINAGE OF PLEURAL EFFUSION,PERICARDIAL WINDOW WITH DRAINAGE OF PERICARDIAL FLUID, TEE (Left) VIDEO BRONCHOSCOPY (N/A) Subjective: feels fair, some SOB  Objective: Vital signs in last 24 hours: Temp:  [97.8 F (36.6 C)-98.6 F (37 C)] 97.8 F (36.6 C) (11/17 0700) Pulse Rate:  [62-79] 67 (11/17 0700) Cardiac Rhythm: Sinus bradycardia (11/17 0701) Resp:  [15-22] 16 (11/17 0700) BP: (118-125)/(47-57) 119/47 (11/17 0700) SpO2:  [95 %-97 %] 97 % (11/17 0700)  Hemodynamic parameters for last 24 hours:    Intake/Output from previous day: No intake/output data recorded. Intake/Output this shift: No intake/output data recorded.  General appearance: alert, cooperative and no distress Heart: regular rate and rhythm Lungs: dim in bases, mildly Abdomen: soft, non-tender Extremities: some edema Wound: incis ok  Lab Results:  Recent Labs  10/28/16 0456 10/29/16 0558  WBC 13.6* 10.2  HGB 9.7* 10.2*  HCT 31.0* 32.8*  PLT 378 389   BMET:  Recent Labs  10/28/16 0456 10/29/16 0558  NA 139 143  K 3.8 4.1  CL 107 107  CO2 27 29  GLUCOSE 96 89  BUN 11 8  CREATININE 0.79 0.78  CALCIUM 7.9* 8.2*    PT/INR: No results for input(s): LABPROT, INR in the last 72 hours. ABG    Component Value Date/Time   PHART 7.313 (L) 10/26/2016 0300   HCO3 24.4 10/26/2016 0300   ACIDBASEDEF 1.0 10/26/2016 0300   O2SAT 98.1 10/26/2016 0300   CBG (last 3)   Recent Labs  10/28/16 1700 10/28/16 2201 10/29/16 0811  GLUCAP 132* 95 97    Meds Scheduled Meds: . acetaminophen  1,000 mg Oral Q6H   Or  . acetaminophen (TYLENOL) oral liquid 160 mg/5 mL  1,000 mg Oral Q6H  . aspirin  325 mg Oral Daily  . bisacodyl  10 mg Oral Daily  . enoxaparin (LOVENOX) injection  40 mg Subcutaneous Q24H  . escitalopram  20 mg  Oral QHS  . insulin aspart  0-15 Units Subcutaneous TID WC  . levothyroxine  25 mcg Oral QAC breakfast  . piperacillin-tazobactam (ZOSYN)  IV  3.375 g Intravenous Q8H  . senna-docusate  1 tablet Oral QHS  . vancomycin  1,000 mg Intravenous Q12H   Results for orders placed or performed during the hospital encounter of 10/23/16  Urine culture     Status: None   Collection Time: 10/25/16 10:44 AM  Result Value Ref Range Status   Specimen Description URINE, RANDOM  Final   Special Requests NONE  Final   Culture NO GROWTH  Final   Report Status 10/26/2016 FINAL  Final  Gram stain     Status: None   Collection Time: 10/25/16  1:55 PM  Result Value Ref Range Status   Specimen Description FLUID LEFT PLEURAL  Final   Special Requests NONE  Final   Gram Stain   Final    CYTOSPIN SMEAR WBC PRESENT,BOTH PMN AND MONONUCLEAR NO ORGANISMS SEEN    Report Status 10/25/2016 FINAL  Final  Culture, body fluid-bottle     Status: None (Preliminary result)   Collection Time: 10/25/16  1:55 PM  Result Value Ref Range Status   Specimen Description FLUID PLEURAL LEFT  Final   Special Requests BOTTLES DRAWN AEROBIC AND ANAEROBIC 10CC  Final   Culture NO GROWTH 3 DAYS  Final   Report Status PENDING  Incomplete  Culture, respiratory (NON-Expectorated)     Status: None   Collection Time: 10/25/16  2:10 PM  Result Value Ref Range Status   Specimen Description BRONCHIAL WASHINGS LEFT  Final   Special Requests PATIENT ON FOLLOWING  VANCY  Final   Gram Stain   Final    RARE WBC PRESENT, PREDOMINANTLY MONONUCLEAR RARE GRAM POSITIVE COCCI IN PAIRS AND CHAINS    Culture Consistent with normal respiratory flora.  Final   Report Status 10/27/2016 FINAL  Final  MRSA PCR Screening     Status: None   Collection Time: 10/26/16  4:12 AM  Result Value Ref Range Status   MRSA by PCR NEGATIVE NEGATIVE Final    Comment:        The GeneXpert MRSA Assay (FDA approved for NASAL specimens only), is one component of  a comprehensive MRSA colonization surveillance program. It is not intended to diagnose MRSA infection nor to guide or monitor treatment for MRSA infections.      Continuous Infusions: . dextrose 5 % and 0.9 % NaCl with KCl 20 mEq/L 10 mL/hr at 10/27/16 0804   PRN Meds:.ALPRAZolam, diphenhydrAMINE **OR** diphenhydrAMINE, naloxone **AND** sodium chloride flush, ondansetron **OR** ondansetron (ZOFRAN) IV, oxyCODONE, traMADol  Xrays Dg Chest Port 1 View  Result Date: 10/28/2016 CLINICAL DATA:  Status post removal of 2 left chest tubes yesterday. EXAM: PORTABLE CHEST 1 VIEW COMPARISON:  Portable chest x-ray of October 27, 2016 FINDINGS: The lungs are well-expanded. The interstitial markings remain increased but have improved bilaterally. There is no pneumothorax on the left. There is a small amount of residual pleural fluid layering posteriorly and laterally. One chest tube remains with its tip overlying the posterior medial aspect of the left seventh rib. There is no significant pleural effusion on the right. The cardiac silhouette remains enlarged. The central pulmonary vascularity is prominent. The left internal jugular venous catheter tip projects over the proximal SVC. IMPRESSION: No significant re-accumulation of a left pleural effusion or left-sided pneumothorax since removal of 2 of the chest tubes. The remaining left chest tube is in stable position. Stable bibasilar atelectasis with small or moderate amount of pleural fluid layering posteriorly and laterally on the left. Cardiomegaly with mild central pulmonary vascular prominence, stable. Electronically Signed   By: David  Martinique M.D.   On: 10/28/2016 09:13    Assessment/Plan: S/P Procedure(s) (LRB): VIDEO ASSISTED THORACOSCOPY (VATS), BRONCH,DRAINAGE OF PLEURAL EFFUSION,PERICARDIAL WINDOW WITH DRAINAGE OF PERICARDIAL FLUID, TEE (Left) VIDEO BRONCHOSCOPY (N/A)   1 doing well with slow physical recovery cont to push pulm  toilet/rehab- might benefit from PT consult 2 conts current abx, no fevers or leukocytosis currently, CX negative. CXR is pending 3 medical management per primary  LOS: 6 days    GOLD,WAYNE E 10/29/2016 Patient seen and examined, agree with above Needs to increase ambulation Home when more mobile and eating better  Remo Lipps C. Roxan Hockey, MD [facility] [phone]

## 2016-10-30 ENCOUNTER — Inpatient Hospital Stay (HOSPITAL_COMMUNITY): Payer: Medicare Other

## 2016-10-30 DIAGNOSIS — R110 Nausea: Secondary | ICD-10-CM

## 2016-10-30 LAB — COMPREHENSIVE METABOLIC PANEL
ALBUMIN: 2 g/dL — AB (ref 3.5–5.0)
ALK PHOS: 111 U/L (ref 38–126)
ALT: 27 U/L (ref 14–54)
AST: 40 U/L (ref 15–41)
Anion gap: 8 (ref 5–15)
BUN: 9 mg/dL (ref 6–20)
CALCIUM: 8.1 mg/dL — AB (ref 8.9–10.3)
CHLORIDE: 107 mmol/L (ref 101–111)
CO2: 29 mmol/L (ref 22–32)
CREATININE: 0.81 mg/dL (ref 0.44–1.00)
GFR calc Af Amer: >60 mL/min (ref >60)
GFR calc non Af Amer: >60 mL/min (ref >60)
GLUCOSE: 102 mg/dL — AB (ref 65–99)
Potassium: 4.1 mmol/L (ref 3.5–5.1)
SODIUM: 144 mmol/L (ref 135–145)
Total Bilirubin: 0.3 mg/dL (ref 0.3–1.2)
Total Protein: 4.9 g/dL — ABNORMAL LOW (ref 6.5–8.1)

## 2016-10-30 LAB — GLUCOSE, CAPILLARY
GLUCOSE-CAPILLARY: 90 mg/dL (ref 65–99)
GLUCOSE-CAPILLARY: 90 mg/dL (ref 65–99)
Glucose-Capillary: 105 mg/dL — ABNORMAL HIGH (ref 65–99)
Glucose-Capillary: 112 mg/dL — ABNORMAL HIGH (ref 65–99)

## 2016-10-30 LAB — CBC WITH DIFFERENTIAL/PLATELET
BASOS ABS: 0 10*3/uL (ref 0.0–0.1)
BASOS PCT: 0 %
EOS ABS: 0.6 10*3/uL (ref 0.0–0.7)
Eosinophils Relative: 5 %
HCT: 32.3 % — ABNORMAL LOW (ref 36.0–46.0)
HEMOGLOBIN: 10 g/dL — AB (ref 12.0–15.0)
LYMPHS ABS: 2 10*3/uL (ref 0.7–4.0)
Lymphocytes Relative: 17 %
MCH: 27.6 pg (ref 26.0–34.0)
MCHC: 31 g/dL (ref 30.0–36.0)
MCV: 89.2 fL (ref 78.0–100.0)
Monocytes Absolute: 1.1 10*3/uL — ABNORMAL HIGH (ref 0.1–1.0)
Monocytes Relative: 9 %
NEUTROS PCT: 69 %
Neutro Abs: 8.3 10*3/uL — ABNORMAL HIGH (ref 1.7–7.7)
PLATELETS: 374 10*3/uL (ref 150–400)
RBC: 3.62 MIL/uL — AB (ref 3.87–5.11)
RDW: 13.9 % (ref 11.5–15.5)
WBC: 11.9 10*3/uL — AB (ref 4.0–10.5)

## 2016-10-30 LAB — CULTURE, BODY FLUID-BOTTLE: CULTURE: NO GROWTH

## 2016-10-30 LAB — PHOSPHORUS: PHOSPHORUS: 4.4 mg/dL (ref 2.5–4.6)

## 2016-10-30 LAB — CULTURE, BODY FLUID W GRAM STAIN -BOTTLE

## 2016-10-30 LAB — MAGNESIUM: Magnesium: 2 mg/dL (ref 1.7–2.4)

## 2016-10-30 LAB — T4, FREE: FREE T4: 0.83 ng/dL (ref 0.61–1.12)

## 2016-10-30 MED ORDER — PANTOPRAZOLE SODIUM 40 MG PO TBEC
40.0000 mg | DELAYED_RELEASE_TABLET | Freq: Every day | ORAL | Status: DC
  Administered 2016-10-30 – 2016-11-04 (×6): 40 mg via ORAL
  Filled 2016-10-30 (×6): qty 1

## 2016-10-30 MED ORDER — ALUM & MAG HYDROXIDE-SIMETH 200-200-20 MG/5ML PO SUSP
30.0000 mL | ORAL | Status: DC | PRN
  Administered 2016-10-30: 30 mL via ORAL
  Filled 2016-10-30: qty 30

## 2016-10-30 NOTE — Progress Notes (Addendum)
      [address]       [place] [address]             [phone]      5 Days Post-Op Procedure(s) (LRB): VIDEO ASSISTED THORACOSCOPY (VATS), BRONCH,DRAINAGE OF PLEURAL EFFUSION,PERICARDIAL WINDOW WITH DRAINAGE OF PERICARDIAL FLUID, TEE (Left) VIDEO BRONCHOSCOPY (N/A)   Subjective:  No new complaints.  Still with poor appetite and not walking much  Objective: Vital signs in last 24 hours: Temp:  [97.3 F (36.3 C)-98.2 F (36.8 C)] 97.3 F (36.3 C) (11/18 0700) Pulse Rate:  [63-73] 73 (11/18 0700) Cardiac Rhythm: Normal sinus rhythm (11/18 0803) Resp:  [14-19] 16 (11/18 0331) BP: (120-131)/(43-57) 121/57 (11/18 0700) SpO2:  [93 %-100 %] 96 % (11/18 0700)  Intake/Output from previous day: 11/17 0701 - 11/18 0700 In: 360 [P.O.:60; IV Piggyback:300] Out: 1 [Urine:1] Intake/Output this shift: Total I/O In: 240 [P.O.:240] Out: -   General appearance: alert, cooperative and no distress Heart: regular rate and rhythm Lungs: diminished breath sounds bibasilar Wound: clean and dry  Lab Results:  Recent Labs  10/29/16 0558 10/30/16 0531  WBC 10.2 11.9*  HGB 10.2* 10.0*  HCT 32.8* 32.3*  PLT 389 374   BMET:  Recent Labs  10/29/16 0558 10/30/16 0531  NA 143 144  K 4.1 4.1  CL 107 107  CO2 29 29  GLUCOSE 89 102*  BUN 8 9  CREATININE 0.78 0.81  CALCIUM 8.2* 8.1*    PT/INR: No results for input(s): LABPROT, INR in the last 72 hours. ABG    Component Value Date/Time   PHART 7.313 (L) 10/26/2016 0300   HCO3 24.4 10/26/2016 0300   ACIDBASEDEF 1.0 10/26/2016 0300   O2SAT 98.1 10/26/2016 0300   CBG (last 3)   Recent Labs  10/29/16 1630 10/29/16 2136 10/30/16 0742  GLUCAP 113* 141* 90    Assessment/Plan: S/P Procedure(s) (LRB): VIDEO ASSISTED THORACOSCOPY (VATS), BRONCH,DRAINAGE OF PLEURAL EFFUSION,PERICARDIAL WINDOW WITH DRAINAGE OF PERICARDIAL FLUID, TEE (Left) VIDEO BRONCHOSCOPY (N/A)  1. Pulm- CXR with better aeration today,  small pleural effusion remains- continue aggressive pulm toilet 2. Deconditioning- needs to ambulate 3. ID on ABX, remains afebrile, cultures are negative 4. Dispo- care per primary, needs to be ambulating, better oral intake prior to discharge   LOS: 7 days    BARRETT, ERIN 10/30/2016   I have seen and examined the patient and agree with the assessment and plan as outlined.  Rexene Alberts, MD 10/30/2016 10:52 AM

## 2016-10-30 NOTE — Progress Notes (Signed)
PROGRESS NOTE    Andrea Martinez  [id number] DOB: 14-Apr-1946 DOA: 10/23/2016 PCP: [name], MD  Brief Narrative:  Andrea Martinez a 70 y.o.femalewith a history of CML on chemotherapy, hypothyroidism, leukocytosis. She presents with 3 days of worsening shortness of breath acutely worse today. She wasdyspneic on exertion to 10 of 15 feet. Improves at rest and on oxygen. Patient even has dyspnea with getting out of bed. Due to worsening symptoms, the patient presented to the hospital for evaluation and was found to have large Left Pleural Effusion and Pericardial Effusion that is now s/p VATS with Drainage, and Drainage of Pericardial Fluid with Window and Biopsy of Pericardium. Patient still complains of having some Nausea.   Assessment & Plan:   Principal Problem:   Pleural effusion Active Problems:   Chronic myelogenous leukemia (CML), BCR-ABL1-positive (HCC)   Solitary pulmonary nodule  Left Pleural effusion, pericardial effusion s/p VATS, Drainage of Effusion, Pericardial Window, and Biopsy  - CVTS consulted, patient underwent Video-bronchoscopy left, drainage left pleural effusion, drainage of pericardial fluid Window. S/P biopsy of pleural, pericardium 11-13. Tube management per TCV - Removed all tubes Biopsy Reults revealed:  1. Pleura, peel, Parietal - BENIGN PLEURA, FIBROUS TISSUE, AND MUSCLE WITH CHRONIC INFLAMMATION. 2. Pleura, peel, Parietal - BENIGN PLEURA AND SOFT TISSUE WITH CHRONIC INFLAMMATION. 3. Pleura, peel, Visceral - BENIGN PLEURA, SOFT TISSUE, AND FIBRIN WITH CHRONIC INFLAMMATION. 4. Pericardium, biopsy - BENIGN PERICARDIAL TISSUE WITH CHRONIC INFLAMMATION.   -Bronchial lavage culture; Growing rare gram positive cocci in chains and pair.  - Continue Vancomycin and Zosyn. Follow culture.  - PT to Evaluate and Treat  Chest pain - Troponin negative. ECHO EF 55 %, in the setting of #1 - Now has chest discomfort/pain where Chest Tubes used to be -  Pain Control with Oxy IR 5 mg po q4hprn, Tramadol 50 mg po q6hprn  Hypothyroidism - Not on medications. - TSH with significant elevation to 24.319 - Free T4 was 0.83; - C/w Levothyroxine at 25 mcg daily  Leukocytosis - WBC went from 13.6 -> 10.2 -> 11.9 - UA with too numerous to count WBC. Urine culture ordered.  - Bronchial washing Growing rare gram positive cocci in chains and pair.  - C/w IV Vancomycin and Zosyn. Follow cultures, still pending today  CML - Hold chemotherapy for now, will defer to Dr. Whitney Muse when this can be resumed in outpatient setting  Solitary Pulmonary Nodule - will need follow up CT per Dr. Luan Pulling  Nausea -C/w Zofran 4 mg po q6hprn or IV 4 mg q6hprn  DVT prophylaxis: Lovenox Code Status: FULL Family Communication: No Family at Bedside Disposition Plan: Pending PT Evaluation  Consultants:   Cardiothroacic Surgery  Procedures:  VATS, bronchoscopy, pericardial window and biopsy Echocardiogram  Antimicrobials: IV Vancomycin and IV Zosyn 11/13 -->  Subjective: Seen and examined this AM and was complaining of Nausea. Had some chest soreness from where chest tubes used to be. No Vomiting. Poor appetite. No other concerns or complaints at this time.   Objective: Vitals:   10/29/16 2307 10/30/16 0331 10/30/16 0700 10/30/16 1100  BP: (!) 127/52 (!) 120/54 (!) 121/57 (!) 122/49  Pulse: 66 63 73 70  Resp: 17 16  14   Temp: 98.2 F (36.8 C) 97.8 F (36.6 C) 97.3 F (36.3 C) 98 F (36.7 C)  TempSrc: Oral Oral Oral Oral  SpO2: 94% 93% 96% 96%  Weight:      Height:        Intake/Output  Summary (Last 24 hours) at 10/30/16 1230 Last data filed at 10/30/16 0800  Gross per 24 hour  Intake              540 ml  Output                1 ml  Net              539 ml   Filed Weights   10/23/16 1415  Weight: 88 kg (194 lb)    Examination: Physical Exam:  Constitutional: WN/WD, NAD and appears calm and comfortable Eyes:  Lids and  conjunctivae normal, sclerae anicteric  ENMT: External Ears, Nose appear normal. Grossly normal hearing.  Neck: Appears normal, supple, no cervical masses, normal ROM, no appreciable thyromegaly Respiratory: Diminished but clear to auscultation bilaterally, no wheezing, rales, rhonchi or crackles. Normal respiratory effort and patient is not tachypenic. No accessory muscle use. Chest Tubes removed. Chest Wound Covered and Dresssing C/D/I.  Cardiovascular: RRR, no murmurs / rubs / gallops. S1 and S2 auscultated. No extremity edema.  Abdomen: Soft, non-tender, non-distended. No masses palpated. No appreciable hepatosplenomegaly. Bowel sounds positive x4.  GU: Deferred. Musculoskeletal: No clubbing / cyanosis of digits/nails. No joint deformity upper and lower extremities.  Skin: No rashes, lesions, ulcers. No induration; Warm and dry.  Neurologic: CN 2-12 grossly intact with no focal deficits. Sensation intact in all 4 Extremities. Romberg sign cerebellar reflexes not assessed.  Psychiatric: Normal judgment and insight. Alert and oriented x 3. Normal mood and appropriate affect.   Data Reviewed: I have personally reviewed following labs and imaging studies  CBC:  Recent Labs Lab 10/23/16 1450  10/26/16 0416 10/27/16 0440 10/28/16 0456 10/29/16 0558 10/30/16 0531  WBC 18.2*  < > 22.0* 15.4* 13.6* 10.2 11.9*  NEUTROABS 14.2*  --   --   --   --   --  8.3*  HGB 12.1  < > 11.2* 9.8* 9.7* 10.2* 10.0*  HCT 36.8  < > 35.3* 31.7* 31.0* 32.8* 32.3*  MCV 88.0  < > 88.0 88.1 86.8 87.9 89.2  PLT 381  < > 328 323 378 389 374  < > = values in this interval not displayed. Basic Metabolic Panel:  Recent Labs Lab 10/26/16 0416 10/27/16 0440 10/28/16 0456 10/29/16 0558 10/30/16 0531  NA 138 138 139 143 144  K 4.7 3.9 3.8 4.1 4.1  CL 106 106 107 107 107  CO2 26 26 27 29 29   GLUCOSE 162* 119* 96 89 102*  BUN 9 15 11 8 9   CREATININE 0.71 0.90 0.79 0.78 0.81  CALCIUM 8.2* 8.0* 7.9* 8.2* 8.1*    MG  --   --   --   --  2.0  PHOS  --   --   --   --  4.4   GFR: Estimated Creatinine Clearance: 71.2 mL/min (by C-G formula based on SCr of 0.81 mg/dL). Liver Function Tests:  Recent Labs Lab 10/23/16 1450 10/24/16 1316 10/27/16 0440 10/30/16 0531  AST 33 29 29 40  ALT 21 21 21 27   ALKPHOS 154* 127* 102 111  BILITOT 1.3* 0.6 0.4 0.3  PROT 8.0 6.2* 5.1* 4.9*  ALBUMIN 3.6 3.0* 2.0* 2.0*   No results for input(s): LIPASE, AMYLASE in the last 168 hours. No results for input(s): AMMONIA in the last 168 hours. Coagulation Profile:  Recent Labs Lab 10/24/16 1316 10/25/16 0147  INR 1.15 1.17   Cardiac Enzymes:  Recent Labs Lab 10/23/16 1450 10/24/16  1316 10/25/16 0147  TROPONINI <0.03 0.03* <0.03   BNP (last 3 results) No results for input(s): PROBNP in the last 8760 hours. HbA1C:  Recent Labs  10/28/16 0456  HGBA1C 5.3   CBG:  Recent Labs Lab 10/29/16 0811 10/29/16 1228 10/29/16 1630 10/29/16 2136 10/30/16 0742  GLUCAP 97 129* 113* 141* 90   Lipid Profile: No results for input(s): CHOL, HDL, LDLCALC, TRIG, CHOLHDL, LDLDIRECT in the last 72 hours. Thyroid Function Tests:  Recent Labs  10/28/16 0456 10/30/16 0531  TSH 24.319*  --   FREET4  --  0.83   Anemia Panel: No results for input(s): VITAMINB12, FOLATE, FERRITIN, TIBC, IRON, RETICCTPCT in the last 72 hours. Sepsis Labs: No results for input(s): PROCALCITON, LATICACIDVEN in the last 168 hours.  Recent Results (from the past 240 hour(s))  Urine culture     Status: None   Collection Time: 10/25/16 10:44 AM  Result Value Ref Range Status   Specimen Description URINE, RANDOM  Final   Special Requests NONE  Final   Culture NO GROWTH  Final   Report Status 10/26/2016 FINAL  Final  Gram stain     Status: None   Collection Time: 10/25/16  1:55 PM  Result Value Ref Range Status   Specimen Description FLUID LEFT PLEURAL  Final   Special Requests NONE  Final   Gram Stain   Final    CYTOSPIN  SMEAR WBC PRESENT,BOTH PMN AND MONONUCLEAR NO ORGANISMS SEEN    Report Status 10/25/2016 FINAL  Final  Culture, body fluid-bottle     Status: None   Collection Time: 10/25/16  1:55 PM  Result Value Ref Range Status   Specimen Description FLUID PLEURAL LEFT  Final   Special Requests BOTTLES DRAWN AEROBIC AND ANAEROBIC 10CC  Final   Culture NO GROWTH 5 DAYS  Final   Report Status 10/30/2016 FINAL  Final  Culture, respiratory (NON-Expectorated)     Status: None   Collection Time: 10/25/16  2:10 PM  Result Value Ref Range Status   Specimen Description BRONCHIAL WASHINGS LEFT  Final   Special Requests PATIENT ON FOLLOWING  VANCY  Final   Gram Stain   Final    RARE WBC PRESENT, PREDOMINANTLY MONONUCLEAR RARE GRAM POSITIVE COCCI IN PAIRS AND CHAINS    Culture Consistent with normal respiratory flora.  Final   Report Status 10/27/2016 FINAL  Final  MRSA PCR Screening     Status: None   Collection Time: 10/26/16  4:12 AM  Result Value Ref Range Status   MRSA by PCR NEGATIVE NEGATIVE Final    Comment:        The GeneXpert MRSA Assay (FDA approved for NASAL specimens only), is one component of a comprehensive MRSA colonization surveillance program. It is not intended to diagnose MRSA infection nor to guide or monitor treatment for MRSA infections.     Radiology Studies: Dg Chest 2 View  Result Date: 10/29/2016 CLINICAL DATA:  Pneumothorax EXAM: CHEST  2 VIEW COMPARISON:  10/28/2016 FINDINGS: The left jugular central venous catheter has been removed. The left chest tube is been removed. There is no pneumothorax. Hazy and linear opacities at both lower lung zones left greater than right are stable. The lateral view demonstrates pleural based opacity anteriorly in the thorax raising the possibility of loculated pleural fluid at the anterior left base. There is some blunting of the posterior right costophrenic angle indicating a small right pleural effusion. IMPRESSION: Left chest tube is  been removed.  There  is no pneumothorax There is the suggestion of loculated pleural fluid at the anterior left base. Small right pleural effusion and basilar atelectasis. Airspace opacities at the left base are difficult to exclude. Electronically Signed   By: Marybelle Killings M.D.   On: 10/29/2016 11:54   Dg Chest Port 1 View  Result Date: 10/30/2016 CLINICAL DATA:  Shortness of Breath EXAM: PORTABLE CHEST 1 VIEW COMPARISON:  10/29/2016 FINDINGS: Cardiomegaly. Diffuse airspace disease throughout the left lung. There is right base atelectasis. No visible effusions. IMPRESSION: Diffuse left lung airspace disease concerning for pneumonia. Right base atelectasis. Electronically Signed   By: Rolm Baptise M.D.   On: 10/30/2016 07:47   Scheduled Meds: . acetaminophen  1,000 mg Oral Q6H   Or  . acetaminophen (TYLENOL) oral liquid 160 mg/5 mL  1,000 mg Oral Q6H  . aspirin  325 mg Oral Daily  . bisacodyl  10 mg Oral Daily  . enoxaparin (LOVENOX) injection  40 mg Subcutaneous Q24H  . escitalopram  20 mg Oral QHS  . insulin aspart  0-15 Units Subcutaneous TID WC  . levothyroxine  25 mcg Oral QAC breakfast  . piperacillin-tazobactam (ZOSYN)  IV  3.375 g Intravenous Q8H  . senna-docusate  1 tablet Oral QHS  . vancomycin  1,000 mg Intravenous Q12H   Continuous Infusions: . dextrose 5 % and 0.9 % NaCl with KCl 20 mEq/L 10 mL/hr at 10/27/16 0804    LOS: 7 days   Kerney Elbe, DO [facility] Pager [phone]  If 7PM-7AM, please contact night-coverage [email or web address] Password [email or web address] 10/30/2016, 12:30 PM

## 2016-10-31 ENCOUNTER — Inpatient Hospital Stay (HOSPITAL_COMMUNITY): Payer: Medicare Other

## 2016-10-31 DIAGNOSIS — J181 Lobar pneumonia, unspecified organism: Secondary | ICD-10-CM

## 2016-10-31 LAB — CBC WITH DIFFERENTIAL/PLATELET
Basophils Absolute: 0 10*3/uL (ref 0.0–0.1)
Basophils Relative: 0 %
Eosinophils Absolute: 0.4 10*3/uL (ref 0.0–0.7)
Eosinophils Relative: 3 %
HEMATOCRIT: 34.5 % — AB (ref 36.0–46.0)
HEMOGLOBIN: 10.9 g/dL — AB (ref 12.0–15.0)
LYMPHS ABS: 2 10*3/uL (ref 0.7–4.0)
LYMPHS PCT: 14 %
MCH: 27.9 pg (ref 26.0–34.0)
MCHC: 31.6 g/dL (ref 30.0–36.0)
MCV: 88.2 fL (ref 78.0–100.0)
MONOS PCT: 9 %
Monocytes Absolute: 1.2 10*3/uL — ABNORMAL HIGH (ref 0.1–1.0)
NEUTROS ABS: 10.2 10*3/uL — AB (ref 1.7–7.7)
NEUTROS PCT: 74 %
Platelets: 491 10*3/uL — ABNORMAL HIGH (ref 150–400)
RBC: 3.91 MIL/uL (ref 3.87–5.11)
RDW: 14.2 % (ref 11.5–15.5)
WBC: 13.8 10*3/uL — AB (ref 4.0–10.5)

## 2016-10-31 LAB — GLUCOSE, CAPILLARY
Glucose-Capillary: 97 mg/dL (ref 65–99)
Glucose-Capillary: 119 mg/dL — ABNORMAL HIGH (ref 65–99)

## 2016-10-31 LAB — COMPREHENSIVE METABOLIC PANEL
ALBUMIN: 2.4 g/dL — AB (ref 3.5–5.0)
ALK PHOS: 154 U/L — AB (ref 38–126)
ALT: 28 U/L (ref 14–54)
AST: 39 U/L (ref 15–41)
Anion gap: 10 (ref 5–15)
BILIRUBIN TOTAL: 0.4 mg/dL (ref 0.3–1.2)
BUN: 8 mg/dL (ref 6–20)
CO2: 26 mmol/L (ref 22–32)
CREATININE: 0.91 mg/dL (ref 0.44–1.00)
Calcium: 8.3 mg/dL — ABNORMAL LOW (ref 8.9–10.3)
Chloride: 102 mmol/L (ref 101–111)
GFR calc Af Amer: >60 mL/min (ref >60)
GLUCOSE: 115 mg/dL — AB (ref 65–99)
POTASSIUM: 3.7 mmol/L (ref 3.5–5.1)
Sodium: 138 mmol/L (ref 135–145)
TOTAL PROTEIN: 5.8 g/dL — AB (ref 6.5–8.1)

## 2016-10-31 LAB — PHOSPHORUS: Phosphorus: 3.3 mg/dL (ref 2.5–4.6)

## 2016-10-31 LAB — MAGNESIUM: MAGNESIUM: 2.1 mg/dL (ref 1.7–2.4)

## 2016-10-31 MED ORDER — DM-GUAIFENESIN ER 30-600 MG PO TB12
1.0000 | ORAL_TABLET | Freq: Two times a day (BID) | ORAL | Status: DC
  Administered 2016-10-31 – 2016-11-04 (×9): 1 via ORAL
  Filled 2016-10-31 (×9): qty 1

## 2016-10-31 MED ORDER — VANCOMYCIN HCL IN DEXTROSE 1-5 GM/200ML-% IV SOLN
1000.0000 mg | Freq: Two times a day (BID) | INTRAVENOUS | Status: DC
  Administered 2016-10-31 – 2016-11-02 (×5): 1000 mg via INTRAVENOUS
  Filled 2016-10-31 (×6): qty 200

## 2016-10-31 MED ORDER — MENTHOL 3 MG MT LOZG
1.0000 | LOZENGE | OROMUCOSAL | Status: DC | PRN
  Filled 2016-10-31: qty 9

## 2016-10-31 MED ORDER — IPRATROPIUM-ALBUTEROL 0.5-2.5 (3) MG/3ML IN SOLN
3.0000 mL | Freq: Three times a day (TID) | RESPIRATORY_TRACT | Status: DC
  Administered 2016-10-31 – 2016-11-04 (×10): 3 mL via RESPIRATORY_TRACT
  Filled 2016-10-31 (×9): qty 3

## 2016-10-31 MED ORDER — SODIUM CHLORIDE 0.9 % IV SOLN
INTRAVENOUS | Status: DC
Start: 2016-10-31 — End: 2016-11-01
  Administered 2016-10-31: 15:00:00 via INTRAVENOUS

## 2016-10-31 MED ORDER — IPRATROPIUM-ALBUTEROL 0.5-2.5 (3) MG/3ML IN SOLN
3.0000 mL | Freq: Four times a day (QID) | RESPIRATORY_TRACT | Status: DC
  Administered 2016-10-31: 3 mL via RESPIRATORY_TRACT
  Filled 2016-10-31 (×2): qty 3

## 2016-10-31 MED ORDER — IPRATROPIUM-ALBUTEROL 0.5-2.5 (3) MG/3ML IN SOLN: 3.0000 mL | RESPIRATORY_TRACT | Status: DC | PRN

## 2016-10-31 NOTE — Progress Notes (Signed)
Pharmacy Antibiotic Note  Andrea Martinez is a 70 y.o. female admitted on 10/23/2016, s/p VATS 11/13 with leukocytosis and concern for PNA.  Pharmacy was consulted for Vancomycin and Zosyn dosing, Vancomycin was stopped 11/18 and is now to resume per hospitalist. No positive micro data - respiratory culture grew normal flora.  Renal function is stable.    Plan: Restart Vancomycin 1gm IV q12h Continue Zosyn 3.375gm IV q8h Will plan to check a Vancomycin trough at steady state Follow for duration of therapy and potential narrowing  Height: 5\' 6"  (167.6 cm) Weight: 194 lb (88 kg) IBW/kg (Calculated) : 59.3  Temp (24hrs), Avg:97.6 F (36.4 C), Min:97.4 F (36.3 C), Max:97.9 F (36.6 C)   Recent Labs Lab 10/27/16 0440 10/28/16 0456 10/29/16 0558 10/30/16 0531 10/31/16 0751  WBC 15.4* 13.6* 10.2 11.9* 13.8*  CREATININE 0.90 0.79 0.78 0.81 0.91    Estimated Creatinine Clearance: 63.4 mL/min (by C-G formula based on SCr of 0.91 mg/dL).    Allergies  Allergen Reactions  . Codeine Nausea Only    Headache  . Esomeprazole Magnesium Nausea Only  . Sulfonamide Derivatives Nausea Only    Headache  . Fluconazole Rash    Redness and blistering on left thigh    Antimicrobials this admission: Vancomycin 11/14 >> Zosyn 11/14 >>  Dose adjustments this admission: none  Microbiology results: 11/13 Pleural fluid: ngtd 11/13 UCx: negative 11/13 Sputum: normal flora 11/14 MRSA PCR negative  Thank you for allowing pharmacy to be a part of this patient's care.  [employer], Pharm.D., BCPS Clinical Pharmacist Pager [phone] 10/31/2016 2:34 PM

## 2016-10-31 NOTE — Evaluation (Signed)
Physical Therapy Evaluation Patient Details Name: Andrea Martinez MRN: [id number] DOB: July 05, 1946 Today's Date: 10/31/2016   History of Present Illness  70 yo admitted with pleural effusion and pericardial effusion s/p window and VATS. PMHx: CML on chemo, hypothyroidism  Clinical Impression  Pt pleasant and willing to mobilize to return home. Pt with decreased activity tolerance and balance who will benefit from acute therapy to maximize independence and function.  Pt with sats noted to be 85% on RA EOB with RN aware and 2L applied with sats 94%. Pt walked from bed to bathroom then when exiting room reached for door frame despite cues and pinched her fingers, pt had seated rest with ice on fingers for 15 min before able to complete gait in hallway. Pt maintained sats  >%90 on 2L throughout. Pt educated for need to have RW for all mobility due to balance and safety deficits with gait. Pt educated for IS and only able to achieve 740mL with cues throughout for depth of breathing as well as technique.  HR 93-110    Follow Up Recommendations No PT follow up    Equipment Recommendations  Rolling walker with 5" wheels    Recommendations for Other Services       Precautions / Restrictions Precautions Precautions: Fall      Mobility  Bed Mobility Overal bed mobility: Modified Independent                Transfers Overall transfer level: Modified independent                  Ambulation/Gait Ambulation/Gait assistance: Min guard Ambulation Distance (Feet): 350 Feet Assistive device: 1 person hand held assist Gait Pattern/deviations: Step-through pattern;Decreased stride length;Trunk flexed   Gait velocity interpretation: Below normal speed for age/gender General Gait Details: pt initially able to ambulate in room without AD and even denied use of AD, however once at door way to room pt began reaching out for environmental support and again denied use of RW. Pt utilizing rail  and HHA throughout gait for stability with cues for posture and need for DME when walking acutely  Stairs            Wheelchair Mobility    Modified Rankin (Stroke Patients Only)       Balance Overall balance assessment: Needs assistance   Sitting balance-Leahy Scale: Good       Standing balance-Leahy Scale: Fair                               Pertinent Vitals/Pain Pain Assessment: 0-10 Pain Score: 4  Pain Location: head Pain Descriptors / Indicators: Aching Pain Intervention(s): Limited activity within patient's tolerance;Repositioned;Monitored during session    Home Living Family/patient expects to be discharged to:: Private residence Living Arrangements: Spouse/significant other Available Help at Discharge: Family;Available 24 hours/day Type of Home: House Home Access: Stairs to enter   [employer] of Steps: 1 Home Layout: Two level;Laundry or work area in [employer]: None      Prior Function Level of Independence: Independent               Journalist, newspaper        Extremity/Trunk Assessment   Upper Extremity Assessment: Generalized weakness           Lower Extremity Assessment: Generalized weakness      Cervical / Trunk Assessment: Kyphotic  Communication   Communication: No difficulties  Cognition Arousal/Alertness: Awake/alert Behavior During Therapy: WFL for tasks assessed/performed Overall Cognitive Status: Within Functional Limits for tasks assessed                      General Comments      Exercises     Assessment/Plan    PT Assessment Patient needs continued PT services  PT Problem List Decreased activity tolerance;Decreased balance;Decreased knowledge of use of DME;Cardiopulmonary status limiting activity;Decreased mobility          PT Treatment Interventions Gait training;Stair training;Functional mobility training;Balance training;Therapeutic exercise;Therapeutic  activities;DME instruction;Patient/family education    PT Goals (Current goals can be found in the Care Plan section)  Acute Rehab PT Goals Patient Stated Goal: return home PT Goal Formulation: With patient Time For Goal Achievement: 11/14/16 Potential to Achieve Goals: Good    Frequency Min 3X/week   Barriers to discharge        Co-evaluation               End of Session   Activity Tolerance: Patient tolerated treatment well Patient left: in chair;with call bell/phone within reach Nurse Communication: Mobility status         Time: 0720-0814 PT Time Calculation (min) (ACUTE ONLY): 54 min   Charges:   PT Evaluation $PT Eval Moderate Complexity: 1 Procedure PT Treatments $Gait Training: 8-22 mins   PT G Codes:        Kandi Brusseau B Zymarion Favorite Nov 13, 2016, 9:42 AM  Elwyn Reach, [place]

## 2016-10-31 NOTE — Progress Notes (Addendum)
PROGRESS NOTE    Andrea Martinez  [id number] DOB: 05/09/1946 DOA: 10/23/2016 PCP: [name], MD  Brief Narrative:  Andrea Martinez a 70 y.o.femalewith a history of CML on chemotherapy, hypothyroidism, leukocytosis. She presents with 3 days of worsening shortness of breath acutely worse today. She wasdyspneic on exertion to 10 of 15 feet. Improves at rest and on oxygen. Patient even has dyspnea with getting out of bed. Due to worsening symptoms, the patient presented to the hospital for evaluation and was found to have large Left Pleural Effusion and Pericardial Effusion that is now s/p VATS with Drainage, and Drainage of Pericardial Fluid with Window and Biopsy of Pericardium. Patient still complains of having some Nausea however upon prior review of records she has had chronic Nausea. Patient desaturated today to 85% on Room Air so a CXR was obtained and showed likely a PNA. Compared to yesterday's CXR was Stable. Patient will be scheduled on Breathing treatments and Ambulated.    Assessment & Plan:   Principal Problem:   Pleural effusion Active Problems:   Chronic myelogenous leukemia (CML), BCR-ABL1-positive (HCC)   Solitary pulmonary nodule   Left Lung Health-Care Associated Pneumonia -Patient Afebrile.  -CXR 10/30/2016 showed Diffuse left lung airspace disease concerning for pneumonia. Right base atelectasis. -CXR this AM showed stable right basilar atelectasis and left-sided opacity - WBC went from 13.6 -> 10.2 -> 11.9 -> 13.8 - UA with too numerous to count WBC. Urine culture ordered and showed No Growth. - Bronchial washing Growing rare gram positive cocci in chains and pair and culture consistent with Normal Respiratory Flora.  MRSA was Negative. -Patient's O2 Saturation dropped to 85% on Room Air -Started Patient on O2 Via [place] - C/w IV Vancomycin and Zosyn.  - Low Dose IVF with NS at 50 mL/hr - Added DuoNeb Breathing Tx q6h and q2hprn - Will Add Mucinex for  Expectorant  Left Pleural effusion, pericardial effusion s/p VATS, Drainage of Effusion, Pericardial Window, and Biopsy  - CVTS consulted, patient underwent Video-bronchoscopy left, drainage left pleural effusion, drainage of pericardial fluid Window. S/P biopsy of pleural, pericardium 11-13. Tube management per TCV - Removed all tubes Biopsy Reults revealed:  1. Pleura, peel, Parietal - BENIGN PLEURA, FIBROUS TISSUE, AND MUSCLE WITH CHRONIC INFLAMMATION. 2. Pleura, peel, Parietal - BENIGN PLEURA AND SOFT TISSUE WITH CHRONIC INFLAMMATION. 3. Pleura, peel, Visceral - BENIGN PLEURA, SOFT TISSUE, AND FIBRIN WITH CHRONIC INFLAMMATION. 4. Pericardium, biopsy - BENIGN PERICARDIAL TISSUE WITH CHRONIC INFLAMMATION.   -Bronchial lavage culture; Growing rare gram positive cocci in chains and pair.  - Continue Vancomycin and Zosyn.  - PT to Evaluate and Treat  Chest Discomfort - Troponin negative. ECHO EF 55 %, in the setting of #1 - Continues to have chest discomfort/pain where Chest Tubes used to be. ? Whether it is Pleuritic Pain from PNA - Pain Control with Oxy IR 5 mg po q4hprn, Tramadol 50 mg po q6hprn  Hypothyroidism - Not on Home medications. - TSH with significant elevation to 24.319 - Free T4 was 0.83; - C/w Levothyroxine at 25 mcg daily  CML - Leukocytosis could possibly be increasing from [facility] - Hold chemotherapy for now, will defer to Dr. Whitney Muse when this can be resumed in outpatient setting. Will Call [facility] in AM at [phone] to discuss with Dr. Whitney Muse.   Solitary Pulmonary Nodule  - will need follow up CT per Dr. Luan Pulling  Nausea and Sore Throat -Evaluation of the throat  -C/w Zofran 4  mg po q6hprn or IV 4 mg q6hprn -Not new from review of Dr. Donald Pore notes.   Depression/Anxiety -C/w Alprazolam 0.25 mg po q8hprn for anxiety/sleep -C/w Escitalopram 20 mg po Daily qHS.   DVT prophylaxis: Lovenox Code Status: FULL Family Communication:  Discussed with Family at Bedside Disposition Plan: Pending PT Evaluation  Consultants:   Cardiothroacic Surgery  Procedures:  VATS, bronchoscopy, pericardial window and biopsy Echocardiogram  Antimicrobials: IV Vancomycin and IV Zosyn 11/13 -->  Subjective: Seen and examined this AM and was complaining of Nausea again but this time of a sore throat. Had some chest soreness from where chest tubes used to be. No Vomiting. Poor appetite and still feeling weak and "blah" and she stated she felt "puny". No other concerns or complaints at this time.   Objective: Vitals:   10/30/16 1519 10/30/16 2021 10/31/16 0515 10/31/16 0738  BP: (!) 131/53 (!) 123/54 (!) 123/49   Pulse: 86 81 79 93  Resp: 16 18 18    Temp: 97.4 F (36.3 C) 97.6 F (36.4 C) 97.5 F (36.4 C)   TempSrc: Oral Oral Oral   SpO2: 93% 100% 93% (!) 85%  Weight:      Height:       No intake or output data in the 24 hours ending 10/31/16 1107 Filed Weights   10/23/16 1415  Weight: 88 kg (194 lb)    Examination: Physical Exam:  Constitutional: WN/WD, NAD and appears calm and comfortable Eyes:  Lids and conjunctivae normal, sclerae anicteric  ENMT: External Ears, Nose appear normal. Grossly normal hearing.  Neck: Appears normal, supple, no cervical masses, normal ROM, no appreciable thyromegaly Respiratory: Diminished but clear to auscultation bilaterally with worse on Left than right., No appreciable wheezing, rales, rhonchi or crackles. Normal respiratory effort and patient is not tachypenic. No accessory muscle use. Chest Tubes removed. Chest Wound Covered and Dresssing C/D/I. Wearing O2 via [place].  Cardiovascular: RRR, no murmurs / rubs / gallops. S1 and S2 auscultated. No extremity edema.  Abdomen: Soft, non-tender, non-distended. No masses palpated. No appreciable hepatosplenomegaly. Bowel sounds positive x4.  GU: Deferred. Musculoskeletal: No clubbing / cyanosis of digits/nails. No joint deformity upper and lower  extremities.  Skin: No rashes, lesions, ulcers. No induration; Warm and dry.  Neurologic: CN 2-12 grossly intact with no focal deficits. Sensation intact in all 4 Extremities. Romberg sign cerebellar reflexes not assessed.  Psychiatric: Normal judgment and insight. Alert and oriented x 3. Depressed mood and appropriate affect.   Data Reviewed: I have personally reviewed following labs and imaging studies  CBC:  Recent Labs Lab 10/27/16 0440 10/28/16 0456 10/29/16 0558 10/30/16 0531 10/31/16 0751  WBC 15.4* 13.6* 10.2 11.9* 13.8*  NEUTROABS  --   --   --  8.3* 10.2*  HGB 9.8* 9.7* 10.2* 10.0* 10.9*  HCT 31.7* 31.0* 32.8* 32.3* 34.5*  MCV 88.1 86.8 87.9 89.2 88.2  PLT 323 378 389 374 [id number]*   Basic Metabolic Panel:  Recent Labs Lab 10/27/16 0440 10/28/16 0456 10/29/16 0558 10/30/16 0531 10/31/16 0751  NA 138 139 143 144 138  K 3.9 3.8 4.1 4.1 3.7  CL 106 107 107 107 102  CO2 26 27 29 29 26   GLUCOSE 119* 96 89 102* 115*  BUN 15 11 8 9 8   CREATININE 0.90 0.79 0.78 0.81 0.91  CALCIUM 8.0* 7.9* 8.2* 8.1* 8.3*  MG  --   --   --  2.0 2.1  PHOS  --   --   --  4.4 3.3   GFR: Estimated Creatinine Clearance: 63.4 mL/min (by C-G formula based on SCr of 0.91 mg/dL). Liver Function Tests:  Recent Labs Lab 10/24/16 1316 10/27/16 0440 10/30/16 0531 10/31/16 0751  AST 29 29 40 39  ALT 21 21 27 28   ALKPHOS 127* 102 111 154*  BILITOT 0.6 0.4 0.3 0.4  PROT 6.2* 5.1* 4.9* 5.8*  ALBUMIN 3.0* 2.0* 2.0* 2.4*   No results for input(s): LIPASE, AMYLASE in the last 168 hours. No results for input(s): AMMONIA in the last 168 hours. Coagulation Profile:  Recent Labs Lab 10/24/16 1316 10/25/16 0147  INR 1.15 1.17   Cardiac Enzymes:  Recent Labs Lab 10/24/16 1316 10/25/16 0147  TROPONINI 0.03* <0.03   BNP (last 3 results) No results for input(s): PROBNP in the last 8760 hours. HbA1C: No results for input(s): HGBA1C in the last 72 hours. CBG:  Recent Labs Lab  10/30/16 0742 10/30/16 1157 10/30/16 1701 10/30/16 2228 10/31/16 0641  GLUCAP 90 105* 90 112* 97   Lipid Profile: No results for input(s): CHOL, HDL, LDLCALC, TRIG, CHOLHDL, LDLDIRECT in the last 72 hours. Thyroid Function Tests:  Recent Labs  10/30/16 0531  FREET4 0.83   Anemia Panel: No results for input(s): VITAMINB12, FOLATE, FERRITIN, TIBC, IRON, RETICCTPCT in the last 72 hours. Sepsis Labs: No results for input(s): PROCALCITON, LATICACIDVEN in the last 168 hours.  Recent Results (from the past 240 hour(s))  Urine culture     Status: None   Collection Time: 10/25/16 10:44 AM  Result Value Ref Range Status   Specimen Description URINE, RANDOM  Final   Special Requests NONE  Final   Culture NO GROWTH  Final   Report Status 10/26/2016 FINAL  Final  Gram stain     Status: None   Collection Time: 10/25/16  1:55 PM  Result Value Ref Range Status   Specimen Description FLUID LEFT PLEURAL  Final   Special Requests NONE  Final   Gram Stain   Final    CYTOSPIN SMEAR WBC PRESENT,BOTH PMN AND MONONUCLEAR NO ORGANISMS SEEN    Report Status 10/25/2016 FINAL  Final  Culture, body fluid-bottle     Status: None   Collection Time: 10/25/16  1:55 PM  Result Value Ref Range Status   Specimen Description FLUID PLEURAL LEFT  Final   Special Requests BOTTLES DRAWN AEROBIC AND ANAEROBIC 10CC  Final   Culture NO GROWTH 5 DAYS  Final   Report Status 10/30/2016 FINAL  Final  Culture, respiratory (NON-Expectorated)     Status: None   Collection Time: 10/25/16  2:10 PM  Result Value Ref Range Status   Specimen Description BRONCHIAL WASHINGS LEFT  Final   Special Requests PATIENT ON FOLLOWING  VANCY  Final   Gram Stain   Final    RARE WBC PRESENT, PREDOMINANTLY MONONUCLEAR RARE GRAM POSITIVE COCCI IN PAIRS AND CHAINS    Culture Consistent with normal respiratory flora.  Final   Report Status 10/27/2016 FINAL  Final  MRSA PCR Screening     Status: None   Collection Time: 10/26/16   4:12 AM  Result Value Ref Range Status   MRSA by PCR NEGATIVE NEGATIVE Final    Comment:        The GeneXpert MRSA Assay (FDA approved for NASAL specimens only), is one component of a comprehensive MRSA colonization surveillance program. It is not intended to diagnose MRSA infection nor to guide or monitor treatment for MRSA infections.     Radiology Studies: Dg  Chest 2 View  Result Date: 10/29/2016 CLINICAL DATA:  Pneumothorax EXAM: CHEST  2 VIEW COMPARISON:  10/28/2016 FINDINGS: The left jugular central venous catheter has been removed. The left chest tube is been removed. There is no pneumothorax. Hazy and linear opacities at both lower lung zones left greater than right are stable. The lateral view demonstrates pleural based opacity anteriorly in the thorax raising the possibility of loculated pleural fluid at the anterior left base. There is some blunting of the posterior right costophrenic angle indicating a small right pleural effusion. IMPRESSION: Left chest tube is been removed.  There is no pneumothorax There is the suggestion of loculated pleural fluid at the anterior left base. Small right pleural effusion and basilar atelectasis. Airspace opacities at the left base are difficult to exclude. Electronically Signed   By: Marybelle Killings M.D.   On: 10/29/2016 11:54   Dg Chest Port 1 View  Result Date: 10/30/2016 CLINICAL DATA:  Shortness of Breath EXAM: PORTABLE CHEST 1 VIEW COMPARISON:  10/29/2016 FINDINGS: Cardiomegaly. Diffuse airspace disease throughout the left lung. There is right base atelectasis. No visible effusions. IMPRESSION: Diffuse left lung airspace disease concerning for pneumonia. Right base atelectasis. Electronically Signed   By: Rolm Baptise M.D.   On: 10/30/2016 07:47   Scheduled Meds: . aspirin  325 mg Oral Daily  . bisacodyl  10 mg Oral Daily  . enoxaparin (LOVENOX) injection  40 mg Subcutaneous Q24H  . escitalopram  20 mg Oral QHS  . levothyroxine  25 mcg  Oral QAC breakfast  . pantoprazole  40 mg Oral Daily  . piperacillin-tazobactam (ZOSYN)  IV  3.375 g Intravenous Q8H  . senna-docusate  1 tablet Oral QHS   Continuous Infusions: . dextrose 5 % and 0.9 % NaCl with KCl 20 mEq/L 10 mL/hr at 10/27/16 0804    LOS: 8 days   Kerney Elbe, DO [facility] Pager [phone]  If 7PM-7AM, please contact night-coverage [email or web address] Password [email or web address] 10/31/2016, 11:07 AM

## 2016-10-31 NOTE — Progress Notes (Addendum)
      [address]       [place] [address]             [phone]      6 Days Post-Op Procedure(s) (LRB): VIDEO ASSISTED THORACOSCOPY (VATS), BRONCH,DRAINAGE OF PLEURAL EFFUSION,PERICARDIAL WINDOW WITH DRAINAGE OF PERICARDIAL FLUID, TEE (Left) VIDEO BRONCHOSCOPY (N/A)   Subjective:  Complains of some mild incisional pain.  She ambulated with PT this morning without much difficulty.  Still not eating much.  Objective: Vital signs in last 24 hours: Temp:  [97.4 F (36.3 C)-98 F (36.7 C)] 97.5 F (36.4 C) (11/19 0515) Pulse Rate:  [70-93] 93 (11/19 0738) Cardiac Rhythm: Normal sinus rhythm (11/18 2000) Resp:  [14-18] 18 (11/19 0515) BP: (122-131)/(49-54) 123/49 (11/19 0515) SpO2:  [85 %-100 %] 85 % (11/19 0738)  Intake/Output from previous day: 11/18 0701 - 11/19 0700 In: 240 [P.O.:240] Out: -   General appearance: alert, cooperative and no distress Heart: regular rate and rhythm Lungs: diminished breath sounds bibasilar Wound: clean and dry  Lab Results:  Recent Labs  10/30/16 0531 10/31/16 0751  WBC 11.9* 13.8*  HGB 10.0* 10.9*  HCT 32.3* 34.5*  PLT 374 491*   BMET:  Recent Labs  10/30/16 0531 10/31/16 0751  NA 144 138  K 4.1 3.7  CL 107 102  CO2 29 26  GLUCOSE 102* 115*  BUN 9 8  CREATININE 0.81 0.91  CALCIUM 8.1* 8.3*    PT/INR: No results for input(s): LABPROT, INR in the last 72 hours. ABG    Component Value Date/Time   PHART 7.313 (L) 10/26/2016 0300   HCO3 24.4 10/26/2016 0300   ACIDBASEDEF 1.0 10/26/2016 0300   O2SAT 98.1 10/26/2016 0300   CBG (last 3)   Recent Labs  10/30/16 1701 10/30/16 2228 10/31/16 0641  GLUCAP 90 112* 97    Assessment/Plan: S/P Procedure(s) (LRB): VIDEO ASSISTED THORACOSCOPY (VATS), BRONCH,DRAINAGE OF PLEURAL EFFUSION,PERICARDIAL WINDOW WITH DRAINAGE OF PERICARDIAL FLUID, TEE (Left) VIDEO BRONCHOSCOPY (N/A)  1. Pulm- stable,  Off oxygen, continue pulm toilet 2. Deconditioning-  ambulated with PT today without difficulty 3. Oral intake remains poor 4. Dispo- patient stable from surgical standpoint, continue ambulation, oral intake... Care per medicine   LOS: 8 days    BARRETT, ERIN 10/31/2016  I have seen and examined the patient and agree with the assessment and plan as outlined.  Still moving slow.  Rexene Alberts, MD 10/31/2016 10:42 AM

## 2016-11-01 ENCOUNTER — Inpatient Hospital Stay (HOSPITAL_COMMUNITY): Payer: Medicare Other

## 2016-11-01 DIAGNOSIS — J189 Pneumonia, unspecified organism: Secondary | ICD-10-CM

## 2016-11-01 LAB — COMPREHENSIVE METABOLIC PANEL
ALT: 24 U/L (ref 14–54)
ANION GAP: 8 (ref 5–15)
AST: 30 U/L (ref 15–41)
Albumin: 2.2 g/dL — ABNORMAL LOW (ref 3.5–5.0)
Alkaline Phosphatase: 127 U/L — ABNORMAL HIGH (ref 38–126)
BUN: 8 mg/dL (ref 6–20)
CHLORIDE: 103 mmol/L (ref 101–111)
CO2: 29 mmol/L (ref 22–32)
Calcium: 8 mg/dL — ABNORMAL LOW (ref 8.9–10.3)
Creatinine, Ser: 0.87 mg/dL (ref 0.44–1.00)
GFR calc non Af Amer: >60 mL/min (ref >60)
Glucose, Bld: 110 mg/dL — ABNORMAL HIGH (ref 65–99)
POTASSIUM: 3.5 mmol/L (ref 3.5–5.1)
SODIUM: 140 mmol/L (ref 135–145)
Total Bilirubin: 0.3 mg/dL (ref 0.3–1.2)
Total Protein: 5.4 g/dL — ABNORMAL LOW (ref 6.5–8.1)

## 2016-11-01 LAB — CBC WITH DIFFERENTIAL/PLATELET
Basophils Absolute: 0 10*3/uL (ref 0.0–0.1)
Basophils Relative: 0 %
EOS ABS: 0.3 10*3/uL (ref 0.0–0.7)
EOS PCT: 2 %
HCT: 31.4 % — ABNORMAL LOW (ref 36.0–46.0)
Hemoglobin: 9.4 g/dL — ABNORMAL LOW (ref 12.0–15.0)
LYMPHS ABS: 1.5 10*3/uL (ref 0.7–4.0)
Lymphocytes Relative: 14 %
MCH: 26.7 pg (ref 26.0–34.0)
MCHC: 29.9 g/dL — AB (ref 30.0–36.0)
MCV: 89.2 fL (ref 78.0–100.0)
MONO ABS: 1.2 10*3/uL — AB (ref 0.1–1.0)
Monocytes Relative: 11 %
Neutro Abs: 8.2 10*3/uL — ABNORMAL HIGH (ref 1.7–7.7)
Neutrophils Relative %: 73 %
PLATELETS: 421 10*3/uL — AB (ref 150–400)
RBC: 3.52 MIL/uL — AB (ref 3.87–5.11)
RDW: 14.2 % (ref 11.5–15.5)
WBC: 11.1 10*3/uL — AB (ref 4.0–10.5)

## 2016-11-01 LAB — PHOSPHORUS: PHOSPHORUS: 2.7 mg/dL (ref 2.5–4.6)

## 2016-11-01 LAB — MAGNESIUM: MAGNESIUM: 1.9 mg/dL (ref 1.7–2.4)

## 2016-11-01 MED ORDER — FUROSEMIDE 40 MG PO TABS
40.0000 mg | ORAL_TABLET | Freq: Once | ORAL | Status: AC
  Administered 2016-11-01: 40 mg via ORAL
  Filled 2016-11-01: qty 1

## 2016-11-01 MED ORDER — FUROSEMIDE 10 MG/ML IJ SOLN
20.0000 mg | Freq: Once | INTRAMUSCULAR | Status: AC
  Administered 2016-11-01: 20 mg via INTRAVENOUS

## 2016-11-01 MED ORDER — POTASSIUM CHLORIDE CRYS ER 20 MEQ PO TBCR
40.0000 meq | EXTENDED_RELEASE_TABLET | Freq: Two times a day (BID) | ORAL | Status: AC
  Administered 2016-11-01 (×2): 40 meq via ORAL
  Filled 2016-11-01 (×2): qty 2

## 2016-11-01 NOTE — Progress Notes (Signed)
Physical Therapy Treatment Patient Details Name: Andrea Martinez MRN: [id number] DOB: 21-Jan-1946 Today's Date: 11/01/2016    History of Present Illness 70 yo admitted with pleural effusion and pericardial effusion s/p window and VATS. PMHx: CML on chemo, hypothyroidism    PT Comments    Pt very pleasant and demonstrating increased endurance today. Pt with Short term memory deficits repeating events from last session as well as restating events from prior medical visits throughout session. Pt educated for need for supplemental O2 as well as safety concerns with mobility and gait. Continue to recommend supervision for gait for pt safety and cues for activity modification as pt is unaware of desaturation with activity.   Sats 95% on 2L at rest, drop to 85% on 2L with gait, drops to 88% on 3L with gait, on 4L able to maintain 91% HR 78-101  Follow Up Recommendations  No PT follow up     Equipment Recommendations  None recommended by PT    Recommendations for Other Services       Precautions / Restrictions Precautions Precautions: Fall Precaution Comments: watch sats    Mobility  Bed Mobility Overal bed mobility: Modified Independent                Transfers Overall transfer level: Modified independent                  Ambulation/Gait Ambulation/Gait assistance: Supervision Ambulation Distance (Feet): 400 Feet Assistive device: Rolling walker (2 wheeled);None Gait Pattern/deviations: Step-through pattern;Decreased stride length   Gait velocity interpretation: Below normal speed for age/gender General Gait Details: pt walking without reaching out for environmental support today but hesitant, initially used RW with increased stride and posture able to remove RW halfway through gait without LOB or need for assist. Cues for safety with gait   Stairs            Wheelchair Mobility    Modified Rankin (Stroke Patients Only)       Balance                                    Cognition Arousal/Alertness: Awake/alert Behavior During Therapy: WFL for tasks assessed/performed Overall Cognitive Status: Within Functional Limits for tasks assessed                      Exercises General Exercises - Lower Extremity Long Arc Quad: AROM;15 reps;Both;Seated Hip Flexion/Marching: AROM;15 reps;Both;Seated    General Comments        Pertinent Vitals/Pain Pain Score: 6  Pain Location: left chest at VATS sight Pain Descriptors / Indicators: Stabbing Pain Intervention(s): Limited activity within patient's tolerance;Monitored during session;Repositioned    Home Living                      Prior Function            PT Goals (current goals can now be found in the care plan section) Progress towards PT goals: Progressing toward goals    Frequency           PT Plan Current plan remains appropriate    Co-evaluation             End of Session Equipment Utilized During Treatment: Oxygen Activity Tolerance: Patient tolerated treatment well Patient left: in bed;with call bell/phone within reach     Time: 1145-1208 PT Time Calculation (min) (ACUTE ONLY):  23 min  Charges:  $Gait Training: 8-22 mins $Therapeutic Exercise: 8-22 mins                    G Codes:      Juliahna Wiswell B Talina Pleitez 11-09-2016, 12:15 PM  Elwyn Reach, [place]

## 2016-11-01 NOTE — Progress Notes (Signed)
Pt ambulated 568ft on 2L [place].  Pt tolerated well.

## 2016-11-01 NOTE — Progress Notes (Addendum)
[address]       [place] [address]             [phone]      7 Days Post-Op Procedure(s) (LRB): VIDEO ASSISTED THORACOSCOPY (VATS), BRONCH,DRAINAGE OF PLEURAL EFFUSION,PERICARDIAL WINDOW WITH DRAINAGE OF PERICARDIAL FLUID, TEE (Left) VIDEO BRONCHOSCOPY (N/A) Subjective: Feeling much better, some referred incisional/neuropathic discomfort left anterior chest + loose stools Some SVT Objective: Vital signs in last 24 hours: Temp:  [97.9 F (36.6 C)-98.7 F (37.1 C)] 98.7 F (37.1 C) (11/20 0515) Pulse Rate:  [74-91] 80 (11/20 0515) Cardiac Rhythm: Normal sinus rhythm (11/20 0700) Resp:  [18] 18 (11/20 0515) BP: (127-144)/(49-55) 137/55 (11/20 0515) SpO2:  [93 %-99 %] 96 % (11/20 0745)  Hemodynamic parameters for last 24 hours:    Intake/Output from previous day: 11/19 0701 - 11/20 0700 In: 480 [P.O.:480] Out: -  Intake/Output this shift: No intake/output data recorded.  General appearance: alert, cooperative and no distress Heart: regular rate and rhythm Lungs: dim in lower fields Abdomen: benign Extremities: + edema- nonpitting Wound: incis healing well  Lab Results:  Recent Labs  10/31/16 0751 11/01/16 0232  WBC 13.8* 11.1*  HGB 10.9* 9.4*  HCT 34.5* 31.4*  PLT 491* 421*   BMET:  Recent Labs  10/31/16 0751 11/01/16 0232  NA 138 140  K 3.7 3.5  CL 102 103  CO2 26 29  GLUCOSE 115* 110*  BUN 8 8  CREATININE 0.91 0.87  CALCIUM 8.3* 8.0*    PT/INR: No results for input(s): LABPROT, INR in the last 72 hours. ABG    Component Value Date/Time   PHART 7.313 (L) 10/26/2016 0300   HCO3 24.4 10/26/2016 0300   ACIDBASEDEF 1.0 10/26/2016 0300   O2SAT 98.1 10/26/2016 0300   CBG (last 3)   Recent Labs  10/30/16 2228 10/31/16 0641 10/31/16 1126  GLUCAP 112* 97 119*    Meds Scheduled Meds: . aspirin  325 mg Oral Daily  . bisacodyl  10 mg Oral Daily  . dextromethorphan-guaiFENesin  1 tablet Oral BID  . enoxaparin  (LOVENOX) injection  40 mg Subcutaneous Q24H  . escitalopram  20 mg Oral QHS  . ipratropium-albuterol  3 mL Nebulization TID  . levothyroxine  25 mcg Oral QAC breakfast  . pantoprazole  40 mg Oral Daily  . piperacillin-tazobactam (ZOSYN)  IV  3.375 g Intravenous Q8H  . senna-docusate  1 tablet Oral QHS  . vancomycin  1,000 mg Intravenous Q12H   Continuous Infusions: . sodium chloride 50 mL/hr at 10/31/16 1500  . dextrose 5 % and 0.9 % NaCl with KCl 20 mEq/L 10 mL/hr at 10/27/16 0804   PRN Meds:.ALPRAZolam, alum & mag hydroxide-simeth, diphenhydrAMINE **OR** diphenhydrAMINE, ipratropium-albuterol, menthol-cetylpyridinium, ondansetron **OR** ondansetron (ZOFRAN) IV, oxyCODONE, traMADol  Xrays Dg Chest Port 1 View  Result Date: 11/01/2016 CLINICAL DATA:  Follow-up left lower lobe pneumonia, shortness of breath, history of leukemia EXAM: PORTABLE CHEST 1 VIEW COMPARISON:  Portable chest x-ray of October 31, 2016 FINDINGS: The lungs are less well inflated today. The interstitial markings have increased throughout the left lung. Coarse lung markings at the right lung base are stable. There is mild elevation of the dome of the right hemidiaphragm. The cardiac silhouette is enlarged. The pulmonary vascularity is mildly engorged. IMPRESSION: CHF with mild pulmonary interstitial edema. Pneumonia or atelectasis in the left mid and lower lung and at the right lung base. The appearance of the chest has deteriorated slightly since yesterday's  study. Electronically Signed   By: David  Martinique M.D.   On: 11/01/2016 07:45   Dg Chest Port 1 View  Result Date: 10/31/2016 CLINICAL DATA:  Shortness of breath EXAM: PORTABLE CHEST 1 VIEW COMPARISON:  October 30, 2016 FINDINGS: Right basilar atelectasis. Hazy and patchy opacity in left mid and lower lung is unchanged. Cardiomediastinal silhouette is stable. No pneumothorax. No other changes. IMPRESSION: Stable right basilar atelectasis and left-sided opacity.  Electronically Signed   By: Dorise Bullion III M.D   On: 10/31/2016 13:29    Assessment/Plan: S/P Procedure(s) (LRB): VIDEO ASSISTED THORACOSCOPY (VATS), BRONCH,DRAINAGE OF PLEURAL EFFUSION,PERICARDIAL WINDOW WITH DRAINAGE OF PERICARDIAL FLUID, TEE (Left) VIDEO BRONCHOSCOPY (N/A)   1 making good progress in surgical recovery 2 check for cdiff 3 CXR is wetter- will give a dose of lasix/ K+  4 medical management o/w per primary svc  LOS: 9 days    GOLD,WAYNE E 11/01/2016 Patient seen and examined. Making better progress OK for dc from surgical standpoint once medical issues resolved  Remo Lipps C. Roxan Hockey, MD [facility] [phone]

## 2016-11-01 NOTE — Consult Note (Signed)
[facility] Primary Care Navigator  11/01/2016  Andrea Martinez 04/20/1946 [id number]   Met with patient at the bedside to identify possible discharge needs. Patient reports having difficulty breathing with increased fatigue than usual that had led to this admission/ surgery. Patient is on oxygen per nasal cannula. She mentioned of not requiring oxygen use at home Patient endorses Dr. Sinda Du at Danley Danker MD office [facility])  as the primary care provider.    Patient states using [facility] in [place] to obtain medications without any problem. She reports managing her medications at home straight out of the containers.  Patient verbalized being able to drive prior to this admission but husband Andrea Martinez) usually drives and provides transportation to her doctors' appointments.  Plan for discharge is home per patient and husband will be the primary caregiver at home.   Patient voiced understanding to call primary care provider's office once she gets home, for a post discharge follow-up appointment within a week or sooner if needs arise. Patient letter provided for her reminder.  Patient denies any further needs or concerns at this time.   For additional questions please contact:  Edwena Felty A. Simcha Speir, BSN, RN-BC [facility] PRIMARY CARE Navigator Cell: [phone]

## 2016-11-01 NOTE — Progress Notes (Signed)
   11/01/16 0236  Vitals  BP (!) 138/55  MAP (mmHg) 75  Pulse Rate 79  Pulse Rate Source Dinamap  Ectopy Frequency Occasional  Pt had 6 beat run of V tach. She denies chest pain and continues to endorse pain at the site were her chest tube was removed. Will adminster pain medication and continue to monitor.

## 2016-11-01 NOTE — Progress Notes (Signed)
SATURATION QUALIFICATIONS: (This note is used to comply with regulatory documentation for home oxygen)  Patient Saturations on Room Air at Rest = 84%  Patient Saturations on 2L while Ambulating = 85%  Patient Saturations on 4L Liters of oxygen while Ambulating = 91%  Please briefly explain why patient needs home oxygen:pt desaturating on RA at rest and required 4L to maintain sats >90% with ambulation Elwyn Reach, Trooper

## 2016-11-01 NOTE — Progress Notes (Signed)
PROGRESS NOTE    Andrea Martinez  [id number] DOB: 04/15/1946 DOA: 10/23/2016 PCP: [name], MD  Brief Narrative:  Andrea Martinez a 70 y.o.femalewith a history of CML on chemotherapy, hypothyroidism, leukocytosis. She presents with 3 days of worsening shortness of breath acutely worse today. She wasdyspneic on exertion to 10 of 15 feet. Improves at rest and on oxygen. Patient even has dyspnea with getting out of bed. Due to worsening symptoms, the patient presented to the hospital for evaluation and was found to have large Left Pleural Effusion and Pericardial Effusion that is now s/p VATS with Drainage, and Drainage of Pericardial Fluid with Window and Biopsy of Pericardium. Patient still complains of having some Nausea however upon prior review of records she has had chronic Nausea. Patient desaturated today to 85% on Room Air so a CXR was obtained and showed likely a PNA. Compared to yesterday's CXR was Stable. Patient will be scheduled on Breathing treatments and Ambulated. Patient states she felt a little better and was able to eat some this Am.   Assessment & Plan:   Principal Problem:   Pleural effusion Active Problems:   Chronic myelogenous leukemia (CML), BCR-ABL1-positive (HCC)   Solitary pulmonary nodule   Left Lung Health-Care Associated Pneumonia -Patient Afebrile as temps have been running from 97.9-98.7. -CXR 10/30/2016 showed Diffuse left lung airspace disease concerning for pneumonia. Right base atelectasis. -CXR yesterday AM showed stable right basilar atelectasis and left-sided opacity - WBC went from 13.6 -> 10.2 -> 11.9 -> 13.8 -> 11.1 - UA with too numerous to count WBC. Urine culture ordered and showed No Growth. - Bronchial washing Growing rare gram positive cocci in chains and pair and culture consistent with Normal Respiratory Flora.  MRSA was Negative. -Patient's O2 Saturation dropped to 85% on Room Air; Requiring O2 now and qualifies for  Home O2  as she desaturating on RA at rest and required 4L to maintain Saturations >90% with Ambulation. - C/w Supplemental O2 Via [place] - C/w IV Vancomycin and Zosyn. Improved after restarting Vancomycin yesterday.  - D/C'd Low Dose IVF with NS at 50 mL/hr based on CXR Findings. - CXR this AM showed CHF with mild pulmonary interstitial edema. Pneumonia or atelectasis in the left mid and lower lung and at the right lung base. The appearance of the chest has deteriorated slightly since yesterday's study. - Gave Patient 1 dose of IV Lasix 20 mg and CT Surgery gave po 40 mg.  - Repeat CXR in AM - C/wDuoNeb Breathing Tx q6h and q2hprn - C/w Mucinex for Expectorant  Left Pleural effusion, pericardial effusion s/p VATS, Drainage of Effusion, Pericardial Window, and Biopsy  - CVTS consulted, patient underwent Video-bronchoscopy left, drainage left pleural effusion, drainage of pericardial fluid Window. S/P biopsy of pleural, pericardium 11-13. Tube management per TCV - Removed all tubes Biopsy Reults revealed:  1. Pleura, peel, Parietal - BENIGN PLEURA, FIBROUS TISSUE, AND MUSCLE WITH CHRONIC INFLAMMATION. 2. Pleura, peel, Parietal - BENIGN PLEURA AND SOFT TISSUE WITH CHRONIC INFLAMMATION. 3. Pleura, peel, Visceral - BENIGN PLEURA, SOFT TISSUE, AND FIBRIN WITH CHRONIC INFLAMMATION. 4. Pericardium, biopsy - BENIGN PERICARDIAL TISSUE WITH CHRONIC INFLAMMATION.   -Bronchial lavage culture; Growing rare gram positive cocci in chains and pair.  - Continue Vancomycin and Zosyn.  - PT to Evaluate and Treat and recommend no PT Follow Up - Per CT Surgery Making good Surgical Recovery and Ok to D/C Home from Surgical Standpoint.   Chest Discomfort - Troponin negative. Recent TEE  showed EF of 50-55 % - Continues to have chest discomfort/pain where Chest Tubes used to be. ? Whether it is Pleuritic Pain from PNA - Pain Control with Oxy IR 5 mg po q4hprn, Tramadol 50 mg po q6hprn  Hypothyroidism - Not on Home  medications. - TSH with significant elevation to 24.319 - Free T4 was 0.83; - C/w Levothyroxine at 25 mcg daily  CML - Hold Chemotherapy for now, will defer to Dr. Whitney Muse when this can be resumed in outpatient setting. Will Call [facility] at [phone] to discuss with Dr. Whitney Muse.   Loose Stools - Patient is Afebrile and WBC is improving - Has been on Abx recently - C. Difficile sent by CT Surgery  Solitary Pulmonary Nodule  - Will need follow up CT per Dr. Luan Pulling  Nausea and Sore Throat - Evaluation of the throat revealed no Erythema - Added Cepacol 3 mg Lozenges - C/w Zofran 4 mg po q6hprn or IV 4 mg q6hprn - Not new from review of Dr. Donald Pore notes.   Depression/Anxiety - C/w Alprazolam 0.25 mg po q8hprn for anxiety/sleep - C/w Escitalopram 20 mg po Daily qHS.   DVT prophylaxis: Lovenox Code Status: FULL Family Communication: Discussed with Husband at Bedside Disposition Plan: Home when stable for D/C  Consultants:   Cardiothroacic Surgery  Procedures:  VATS, bronchoscopy, pericardial window and biopsy Echocardiogram  Antimicrobials: IV Vancomycin and IV Zosyn 11/13 -->  Subjective: Seen and examined this AM and was feeling better and states she was able to eat some without nausea. Stated she has been walking. Also stated that if she lifts her arm above her head her it helps with the Chest Discomfort. CXR with findings for Pumlonary/Vascular Congestion so IVF D/C'd and given Lasix. No other concerns or complaints. Walk Screen revealed patient qualified for Home O2.   Objective: Vitals:   11/01/16 0515 11/01/16 0745 11/01/16 1212 11/01/16 1320  BP: (!) 137/55     Pulse: 80  (!) 101   Resp: 18     Temp: 98.7 F (37.1 C)     TempSrc: Oral     SpO2: 93% 96% 91% 98%  Weight:      Height:        Intake/Output Summary (Last 24 hours) at 11/01/16 1351 Last data filed at 11/01/16 0546  Gross per 24 hour  Intake              480 ml  Output                 0 ml  Net              480 ml   Filed Weights   10/23/16 1415  Weight: 88 kg (194 lb)    Examination: Physical Exam:  Constitutional: WN/WD, NAD and appears calm and comfortable Eyes:  Lids and conjunctivae normal, sclerae anicteric  ENMT: External Ears, Nose appear normal. Grossly normal hearing.  Neck: Appears normal, supple, no cervical masses, normal ROM, no appreciable thyromegaly Respiratory: Diminished but clear to auscultation bilaterally with worse on Left than right., No appreciable wheezing, rales, rhonchi or crackles. Normal respiratory effort and patient is not tachypenic. No accessory muscle use. Chest Tubes removed. Chest Wound Covered and Dresssing C/D/I. Wearing O2 via [place].  Cardiovascular: RRR, no murmurs / rubs / gallops. S1 and S2 auscultated. No extremity edema.  Abdomen: Soft, non-tender, non-distended. No masses palpated. No appreciable hepatosplenomegaly. Bowel sounds positive x4.  GU: Deferred. Musculoskeletal: No clubbing /  cyanosis of digits/nails. No joint deformity upper and lower extremities.  Skin: No rashes, lesions, ulcers. No induration; Warm and dry.  Neurologic: CN 2-12 grossly intact with no focal deficits. Sensation intact in all 4 Extremities. Romberg sign cerebellar reflexes not assessed.  Psychiatric: Normal judgment and insight. Alert and oriented x 3. Normal mood and appropriate affect.   Data Reviewed: I have personally reviewed following labs and imaging studies  CBC:  Recent Labs Lab 10/28/16 0456 10/29/16 0558 10/30/16 0531 10/31/16 0751 11/01/16 0232  WBC 13.6* 10.2 11.9* 13.8* 11.1*  NEUTROABS  --   --  8.3* 10.2* 8.2*  HGB 9.7* 10.2* 10.0* 10.9* 9.4*  HCT 31.0* 32.8* 32.3* 34.5* 31.4*  MCV 86.8 87.9 89.2 88.2 89.2  PLT 378 389 374 491* [id number]*   Basic Metabolic Panel:  Recent Labs Lab 10/28/16 0456 10/29/16 0558 10/30/16 0531 10/31/16 0751 11/01/16 0232  NA 139 143 144 138 140  K 3.8 4.1 4.1 3.7 3.5  CL  107 107 107 102 103  CO2 27 29 29 26 29   GLUCOSE 96 89 102* 115* 110*  BUN 11 8 9 8 8   CREATININE 0.79 0.78 0.81 0.91 0.87  CALCIUM 7.9* 8.2* 8.1* 8.3* 8.0*  MG  --   --  2.0 2.1 1.9  PHOS  --   --  4.4 3.3 2.7   GFR: Estimated Creatinine Clearance: 66.3 mL/min (by C-G formula based on SCr of 0.87 mg/dL). Liver Function Tests:  Recent Labs Lab 10/27/16 0440 10/30/16 0531 10/31/16 0751 11/01/16 0232  AST 29 40 39 30  ALT 21 27 28 24   ALKPHOS 102 111 154* 127*  BILITOT 0.4 0.3 0.4 0.3  PROT 5.1* 4.9* 5.8* 5.4*  ALBUMIN 2.0* 2.0* 2.4* 2.2*   No results for input(s): LIPASE, AMYLASE in the last 168 hours. No results for input(s): AMMONIA in the last 168 hours. Coagulation Profile: No results for input(s): INR, PROTIME in the last 168 hours. Cardiac Enzymes: No results for input(s): CKTOTAL, CKMB, CKMBINDEX, TROPONINI in the last 168 hours. BNP (last 3 results) No results for input(s): PROBNP in the last 8760 hours. HbA1C: No results for input(s): HGBA1C in the last 72 hours. CBG:  Recent Labs Lab 10/30/16 1157 10/30/16 1701 10/30/16 2228 10/31/16 0641 10/31/16 1126  GLUCAP 105* 90 112* 97 119*   Lipid Profile: No results for input(s): CHOL, HDL, LDLCALC, TRIG, CHOLHDL, LDLDIRECT in the last 72 hours. Thyroid Function Tests:  Recent Labs  10/30/16 0531  FREET4 0.83   Anemia Panel: No results for input(s): VITAMINB12, FOLATE, FERRITIN, TIBC, IRON, RETICCTPCT in the last 72 hours. Sepsis Labs: No results for input(s): PROCALCITON, LATICACIDVEN in the last 168 hours.  Recent Results (from the past 240 hour(s))  Urine culture     Status: None   Collection Time: 10/25/16 10:44 AM  Result Value Ref Range Status   Specimen Description URINE, RANDOM  Final   Special Requests NONE  Final   Culture NO GROWTH  Final   Report Status 10/26/2016 FINAL  Final  Gram stain     Status: None   Collection Time: 10/25/16  1:55 PM  Result Value Ref Range Status   Specimen  Description FLUID LEFT PLEURAL  Final   Special Requests NONE  Final   Gram Stain   Final    CYTOSPIN SMEAR WBC PRESENT,BOTH PMN AND MONONUCLEAR NO ORGANISMS SEEN    Report Status 10/25/2016 FINAL  Final  Culture, body fluid-bottle     Status: None  Collection Time: 10/25/16  1:55 PM  Result Value Ref Range Status   Specimen Description FLUID PLEURAL LEFT  Final   Special Requests BOTTLES DRAWN AEROBIC AND ANAEROBIC 10CC  Final   Culture NO GROWTH 5 DAYS  Final   Report Status 10/30/2016 FINAL  Final  Culture, respiratory (NON-Expectorated)     Status: None   Collection Time: 10/25/16  2:10 PM  Result Value Ref Range Status   Specimen Description BRONCHIAL WASHINGS LEFT  Final   Special Requests PATIENT ON FOLLOWING  VANCY  Final   Gram Stain   Final    RARE WBC PRESENT, PREDOMINANTLY MONONUCLEAR RARE GRAM POSITIVE COCCI IN PAIRS AND CHAINS    Culture Consistent with normal respiratory flora.  Final   Report Status 10/27/2016 FINAL  Final  MRSA PCR Screening     Status: None   Collection Time: 10/26/16  4:12 AM  Result Value Ref Range Status   MRSA by PCR NEGATIVE NEGATIVE Final    Comment:        The GeneXpert MRSA Assay (FDA approved for NASAL specimens only), is one component of a comprehensive MRSA colonization surveillance program. It is not intended to diagnose MRSA infection nor to guide or monitor treatment for MRSA infections.     Radiology Studies: Dg Chest Port 1 View  Result Date: 11/01/2016 CLINICAL DATA:  Follow-up left lower lobe pneumonia, shortness of breath, history of leukemia EXAM: PORTABLE CHEST 1 VIEW COMPARISON:  Portable chest x-ray of October 31, 2016 FINDINGS: The lungs are less well inflated today. The interstitial markings have increased throughout the left lung. Coarse lung markings at the right lung base are stable. There is mild elevation of the dome of the right hemidiaphragm. The cardiac silhouette is enlarged. The pulmonary vascularity  is mildly engorged. IMPRESSION: CHF with mild pulmonary interstitial edema. Pneumonia or atelectasis in the left mid and lower lung and at the right lung base. The appearance of the chest has deteriorated slightly since yesterday's study. Electronically Signed   By: David  Martinique M.D.   On: 11/01/2016 07:45   Dg Chest Port 1 View  Result Date: 10/31/2016 CLINICAL DATA:  Shortness of breath EXAM: PORTABLE CHEST 1 VIEW COMPARISON:  October 30, 2016 FINDINGS: Right basilar atelectasis. Hazy and patchy opacity in left mid and lower lung is unchanged. Cardiomediastinal silhouette is stable. No pneumothorax. No other changes. IMPRESSION: Stable right basilar atelectasis and left-sided opacity. Electronically Signed   By: Dorise Bullion III M.D   On: 10/31/2016 13:29   Scheduled Meds: . aspirin  325 mg Oral Daily  . bisacodyl  10 mg Oral Daily  . dextromethorphan-guaiFENesin  1 tablet Oral BID  . enoxaparin (LOVENOX) injection  40 mg Subcutaneous Q24H  . escitalopram  20 mg Oral QHS  . ipratropium-albuterol  3 mL Nebulization TID  . levothyroxine  25 mcg Oral QAC breakfast  . pantoprazole  40 mg Oral Daily  . piperacillin-tazobactam (ZOSYN)  IV  3.375 g Intravenous Q8H  . potassium chloride  40 mEq Oral BID  . senna-docusate  1 tablet Oral QHS  . vancomycin  1,000 mg Intravenous Q12H   Continuous Infusions:   LOS: 9 days   Kerney Elbe, DO [facility] Pager [phone]  If 7PM-7AM, please contact night-coverage [email or web address] Password [email or web address] 11/01/2016, 1:51 PM

## 2016-11-02 ENCOUNTER — Inpatient Hospital Stay (HOSPITAL_COMMUNITY): Payer: Medicare Other

## 2016-11-02 LAB — CBC WITH DIFFERENTIAL/PLATELET
BASOS ABS: 0 10*3/uL (ref 0.0–0.1)
Basophils Relative: 0 %
EOS PCT: 4 %
Eosinophils Absolute: 0.4 10*3/uL (ref 0.0–0.7)
HEMATOCRIT: 30.4 % — AB (ref 36.0–46.0)
Hemoglobin: 9.3 g/dL — ABNORMAL LOW (ref 12.0–15.0)
LYMPHS ABS: 1.7 10*3/uL (ref 0.7–4.0)
LYMPHS PCT: 15 %
MCH: 27.5 pg (ref 26.0–34.0)
MCHC: 30.6 g/dL (ref 30.0–36.0)
MCV: 89.9 fL (ref 78.0–100.0)
Monocytes Absolute: 1.3 10*3/uL — ABNORMAL HIGH (ref 0.1–1.0)
Monocytes Relative: 11 %
NEUTROS ABS: 7.9 10*3/uL — AB (ref 1.7–7.7)
Neutrophils Relative %: 70 %
Platelets: 437 10*3/uL — ABNORMAL HIGH (ref 150–400)
RBC: 3.38 MIL/uL — AB (ref 3.87–5.11)
RDW: 14.3 % (ref 11.5–15.5)
WBC: 11.4 10*3/uL — AB (ref 4.0–10.5)

## 2016-11-02 LAB — C DIFFICILE QUICK SCREEN W PCR REFLEX
C DIFFICILE (CDIFF) INTERP: NOT DETECTED
C Diff antigen: NEGATIVE
C Diff toxin: NEGATIVE

## 2016-11-02 LAB — PHOSPHORUS: PHOSPHORUS: 3.4 mg/dL (ref 2.5–4.6)

## 2016-11-02 LAB — COMPREHENSIVE METABOLIC PANEL
ALK PHOS: 122 U/L (ref 38–126)
ALT: 21 U/L (ref 14–54)
AST: 34 U/L (ref 15–41)
Albumin: 2.1 g/dL — ABNORMAL LOW (ref 3.5–5.0)
Anion gap: 8 (ref 5–15)
BUN: 6 mg/dL (ref 6–20)
CALCIUM: 8.3 mg/dL — AB (ref 8.9–10.3)
CO2: 31 mmol/L (ref 22–32)
CREATININE: 0.97 mg/dL (ref 0.44–1.00)
Chloride: 103 mmol/L (ref 101–111)
GFR, EST NON AFRICAN AMERICAN: 57 mL/min — AB (ref >60)
Glucose, Bld: 98 mg/dL (ref 65–99)
Potassium: 4.4 mmol/L (ref 3.5–5.1)
Sodium: 142 mmol/L (ref 135–145)
TOTAL PROTEIN: 5.2 g/dL — AB (ref 6.5–8.1)
Total Bilirubin: 0.9 mg/dL (ref 0.3–1.2)

## 2016-11-02 LAB — MAGNESIUM: MAGNESIUM: 2 mg/dL (ref 1.7–2.4)

## 2016-11-02 MED ORDER — NAPROXEN 250 MG PO TABS
250.0000 mg | ORAL_TABLET | Freq: Two times a day (BID) | ORAL | Status: AC
  Administered 2016-11-02 – 2016-11-03 (×4): 250 mg via ORAL
  Filled 2016-11-02 (×5): qty 1

## 2016-11-02 NOTE — Care Management Important Message (Signed)
Important Message  Patient Details  Name: Andrea Martinez MRN: [id number] Date of Birth: 05/03/1946   Medicare Important Message Given:  Yes    Nathen May 11/02/2016, 9:39 AM

## 2016-11-02 NOTE — Progress Notes (Signed)
PROGRESS NOTE    Andrea Martinez  [id number] DOB: 08/28/1946 DOA: 10/23/2016 PCP: Andrea Bogus, MD  Brief Narrative:  Andrea Martinez a 70 y.o.femalewith a history of CML on chemotherapy, hypothyroidism, leukocytosis. She presents with 3 days of worsening shortness of breath acutely worse today. She wasdyspneic on exertion to 10 of 15 feet. Improves at rest and on oxygen. Patient even has dyspnea with getting out of bed. Due to worsening symptoms, the patient presented to the hospital for evaluation and was found to have large Left Pleural Effusion and Pericardial Effusion that is now s/p VATS with Drainage, and Drainage of Pericardial Fluid with Window and Biopsy of Pericardium. Patient still complains of having some Nausea however upon prior review of records she has had chronic Nausea. Patient desaturated today to 85% on Room Air so a CXR was obtained and showed likely a PNA. Compared to yesterday's CXR was Stable. Patient will be scheduled on Breathing treatments and Ambulated. Patient complaining of Loose Stools and continued pain on her Rib this Am.   Assessment & Plan:   Principal Problem:   Pleural effusion Active Problems:   Chronic myelogenous leukemia (CML), BCR-ABL1-positive (HCC)   Solitary pulmonary nodule   Left Lung Health-Care Associated Pneumonia -Patient Afebrile as temps have been running from 97.9-98.7. -CXR 10/30/2016 showed Diffuse left lung airspace disease concerning for pneumonia. Right base atelectasis. -CXR yesterday AM showed stable right basilar atelectasis and left-sided opacity - WBC went from 13.6 -> 10.2 -> 11.9 -> 13.8 -> 11.1 -> 11.4d - UA with too numerous to count WBC. Urine culture ordered and showed No Growth. - Bronchial washing Growing rare gram positive cocci in chains and pair and culture consistent with Normal Respiratory Flora.  MRSA was Negative. -Patient's O2 Saturation dropped to 85% on Room Air; Requiring O2 now and qualifies for   Home O2 as she desaturating on RA at rest and required 4L to maintain Saturations >90% with Ambulation. - C/w Supplemental O2 Via [place] - C/w IV Vancomycin and Zosyn. Improved after restarting Vancomycin yesterday. - Chest Xray this AM showed Consolidation left mid to lower lobe may represent infiltrate and/oratelectasis with associated pleural effusion. - D/C'd Low Dose IVF with NS at 50 mL/hr based on CXR Findings. - Repeat CXR in AM - C/wDuoNeb Breathing Tx q6h and q2hprn - C/w Mucinex for Expectorant  Left Pleural effusion, pericardial effusion s/p VATS, Drainage of Effusion, Pericardial Window, and Biopsy  - CVTS consulted, patient underwent Video-bronchoscopy left, drainage left pleural effusion, drainage of pericardial fluid Window. S/P biopsy of pleural, pericardium 11-13. Tube management per TCV - Removed all tubes Biopsy Reults revealed:  1. Pleura, peel, Parietal - BENIGN PLEURA, FIBROUS TISSUE, AND MUSCLE WITH CHRONIC INFLAMMATION. 2. Pleura, peel, Parietal - BENIGN PLEURA AND SOFT TISSUE WITH CHRONIC INFLAMMATION. 3. Pleura, peel, Visceral - BENIGN PLEURA, SOFT TISSUE, AND FIBRIN WITH CHRONIC INFLAMMATION. 4. Pericardium, biopsy - BENIGN PERICARDIAL TISSUE WITH CHRONIC INFLAMMATION.   -Bronchial lavage culture; Growing rare gram positive cocci in chains and pair.  - Continue Vancomycin and Zosyn.  - PT to Evaluate and Treat and recommend no PT Follow Up - Per CT Surgery Making good Surgical Recovery and Ok to D/C Home from Surgical Standpoint.  **Chest Xray this AM showed Progressive asymmetric pulmonary edema. Consolidation left mid to lower lobe may represent infiltrate and/or atelectasis with associated pleural effusion. Cardiomegaly. CT detected pericardial effusion not adequately assessed by plain film exam.  Chest Discomfort - Troponin negative. Recent TEE showed  EF of 50-55 % - Continues to have chest discomfort/pain where Chest Tubes used to be. ? Whether it is  Pleuritic Pain from PNA - Pain Control with Oxy IR 5 mg po q4hprn, Tramadol 50 mg po q6hprn - CT Surgery started Naprosyn for a few days.  Hypothyroidism - Not on Home medications. - TSH with significant elevation to 24.319 - Free T4 was 0.83; - C/w Levothyroxine at 25 mcg daily  CML - Hold Chemotherapy for now, will defer to Andrea Martinez when this can be resumed in outpatient setting. Will Call [facility] at [phone] to discuss with Andrea Martinez.   Loose Stools - Patient is Afebrile. States feels similar to he episodes of IBS - Has been on Abx recently - C. Difficile sent by CT Surgery; Patient states still have some form and are not all water  Solitary Pulmonary Nodule  - Will need follow up CT per Andrea Martinez  Nausea and Sore Throat - Evaluation of the throat revealed no Erythema - Added Cepacol 3 mg Lozenges - C/w Zofran 4 mg po q6hprn or IV 4 mg q6hprn - Not new from review of Andrea Martinez notes.   Depression/Anxiety - C/w Alprazolam 0.25 mg po q8hprn for anxiety/sleep - C/w Escitalopram 20 mg po Daily qHS.   DVT prophylaxis: Lovenox Code Status: FULL Family Communication: Discussed with Husband at Bedside Disposition Plan: Home when stable for D/C  Consultants:   Cardiothroacic Surgery  Procedures:  VATS, bronchoscopy, pericardial window and biopsy Echocardiogram  Antimicrobials: IV Vancomycin and IV Zosyn 11/13 -->  Subjective: Seen and examined this AM and stated she was hurting under left breast on rib again. CT Surgery is trying Naprosyn. Stated she also had loose stools and had 3 episodes this AM and states she had abdominal cramping similar to her previous episodes of IBS. No Cp. SOB doing ok per patient. No other concerns or complaints at this time.   Objective: Vitals:   11/02/16 1329 11/02/16 1437 11/02/16 1937 11/02/16 2002  BP: 140/60   (!) 137/50  Pulse: 72  84 85  Resp: 18  18 18   Temp: 97.9 F (36.6 C)   98 F (36.7 C)    TempSrc: Oral   Oral  SpO2: 96% 97% 97% 99%  Weight:      Height:        Intake/Output Summary (Last 24 hours) at 11/02/16 2042 Last data filed at 11/02/16 2003  Gross per 24 hour  Intake              960 ml  Output             1200 ml  Net             -240 ml   Filed Weights   10/23/16 1415  Weight: 88 kg (194 lb)    Examination: Physical Exam:  Constitutional: WN/WD, NAD and appears calm and comfortable Eyes:  Lids and conjunctivae normal, sclerae anicteric  ENMT: External Ears, Nose appear normal. Grossly normal hearing.  Neck: Appears normal, supple, no cervical masses, normal ROM, no appreciable thyromegaly Chest Wall: Pain on palpation on Rib Respiratory: Diminished but clear to auscultation bilaterally with worse on Left than right. No appreciable wheezing, rales, rhonchi or crackles. Normal respiratory effort and patient is not tachypenic. No accessory muscle use. Chest Tubes removed. Chest Wound Covered and Dresssing C/D/I. Wearing O2 via [place].  Cardiovascular: RRR, no murmurs / rubs / gallops. S1 and S2 auscultated. No  extremity edema.  Abdomen: Soft, non-tender, non-distended. No masses palpated. No appreciable hepatosplenomegaly. Bowel sounds positive x4.  GU: Deferred. Musculoskeletal: No clubbing / cyanosis of digits/nails. No joint deformity upper and lower extremities.  Skin: No rashes, lesions, ulcers. No induration; Warm and dry.  Neurologic: CN 2-12 grossly intact with no focal deficits. Sensation intact in all 4 Extremities. Romberg sign cerebellar reflexes not assessed.  Psychiatric: Normal judgment and insight. Alert and oriented x 3. Normal mood and appropriate affect.   Data Reviewed: I have personally reviewed following labs and imaging studies  CBC:  Recent Labs Lab 10/29/16 0558 10/30/16 0531 10/31/16 0751 11/01/16 0232 11/02/16 0332  WBC 10.2 11.9* 13.8* 11.1* 11.4*  NEUTROABS  --  8.3* 10.2* 8.2* 7.9*  HGB 10.2* 10.0* 10.9* 9.4* 9.3*  HCT  32.8* 32.3* 34.5* 31.4* 30.4*  MCV 87.9 89.2 88.2 89.2 89.9  PLT 389 374 491* 421* [id number]*   Basic Metabolic Panel:  Recent Labs Lab 10/29/16 0558 10/30/16 0531 10/31/16 0751 11/01/16 0232 11/02/16 0332  NA 143 144 138 140 142  K 4.1 4.1 3.7 3.5 4.4  CL 107 107 102 103 103  CO2 29 29 26 29 31   GLUCOSE 89 102* 115* 110* 98  BUN 8 9 8 8 6   CREATININE 0.78 0.81 0.91 0.87 0.97  CALCIUM 8.2* 8.1* 8.3* 8.0* 8.3*  MG  --  2.0 2.1 1.9 2.0  PHOS  --  4.4 3.3 2.7 3.4   GFR: Estimated Creatinine Clearance: 59.5 mL/min (by C-G formula based on SCr of 0.97 mg/dL). Liver Function Tests:  Recent Labs Lab 10/27/16 0440 10/30/16 0531 10/31/16 0751 11/01/16 0232 11/02/16 0332  AST 29 40 39 30 34  ALT 21 27 28 24 21   ALKPHOS 102 111 154* 127* 122  BILITOT 0.4 0.3 0.4 0.3 0.9  PROT 5.1* 4.9* 5.8* 5.4* 5.2*  ALBUMIN 2.0* 2.0* 2.4* 2.2* 2.1*   No results for input(s): LIPASE, AMYLASE in the last 168 hours. No results for input(s): AMMONIA in the last 168 hours. Coagulation Profile: No results for input(s): INR, PROTIME in the last 168 hours. Cardiac Enzymes: No results for input(s): CKTOTAL, CKMB, CKMBINDEX, TROPONINI in the last 168 hours. BNP (last 3 results) No results for input(s): PROBNP in the last 8760 hours. HbA1C: No results for input(s): HGBA1C in the last 72 hours. CBG:  Recent Labs Lab 10/30/16 1157 10/30/16 1701 10/30/16 2228 10/31/16 0641 10/31/16 1126  GLUCAP 105* 90 112* 97 119*   Lipid Profile: No results for input(s): CHOL, HDL, LDLCALC, TRIG, CHOLHDL, LDLDIRECT in the last 72 hours. Thyroid Function Tests: No results for input(s): TSH, T4TOTAL, FREET4, T3FREE, THYROIDAB in the last 72 hours. Anemia Panel: No results for input(s): VITAMINB12, FOLATE, FERRITIN, TIBC, IRON, RETICCTPCT in the last 72 hours. Sepsis Labs: No results for input(s): PROCALCITON, LATICACIDVEN in the last 168 hours.  Recent Results (from the past 240 hour(s))  Urine culture      Status: None   Collection Time: 10/25/16 10:44 AM  Result Value Ref Range Status   Specimen Description URINE, RANDOM  Final   Special Requests NONE  Final   Culture NO GROWTH  Final   Report Status 10/26/2016 FINAL  Final  Gram stain     Status: None   Collection Time: 10/25/16  1:55 PM  Result Value Ref Range Status   Specimen Description FLUID LEFT PLEURAL  Final   Special Requests NONE  Final   Gram Stain   Final  CYTOSPIN SMEAR WBC PRESENT,BOTH PMN AND MONONUCLEAR NO ORGANISMS SEEN    Report Status 10/25/2016 FINAL  Final  Culture, body fluid-bottle     Status: None   Collection Time: 10/25/16  1:55 PM  Result Value Ref Range Status   Specimen Description FLUID PLEURAL LEFT  Final   Special Requests BOTTLES DRAWN AEROBIC AND ANAEROBIC 10CC  Final   Culture NO GROWTH 5 DAYS  Final   Report Status 10/30/2016 FINAL  Final  Culture, respiratory (NON-Expectorated)     Status: None   Collection Time: 10/25/16  2:10 PM  Result Value Ref Range Status   Specimen Description BRONCHIAL WASHINGS LEFT  Final   Special Requests PATIENT ON FOLLOWING  VANCY  Final   Gram Stain   Final    RARE WBC PRESENT, PREDOMINANTLY MONONUCLEAR RARE GRAM POSITIVE COCCI IN PAIRS AND CHAINS    Culture Consistent with normal respiratory flora.  Final   Report Status 10/27/2016 FINAL  Final  MRSA PCR Screening     Status: None   Collection Time: 10/26/16  4:12 AM  Result Value Ref Range Status   MRSA by PCR NEGATIVE NEGATIVE Final    Comment:        The GeneXpert MRSA Assay (FDA approved for NASAL specimens only), is one component of a comprehensive MRSA colonization surveillance program. It is not intended to diagnose MRSA infection nor to guide or monitor treatment for MRSA infections.   C difficile quick scan w PCR reflex     Status: None   Collection Time: 11/02/16  4:15 PM  Result Value Ref Range Status   C Diff antigen NEGATIVE NEGATIVE Final   C Diff toxin NEGATIVE NEGATIVE Final     C Diff interpretation No C. difficile detected.  Final    Radiology Studies: Dg Chest Port 1 View  Result Date: 11/02/2016 CLINICAL DATA:  70 year old female with shortness of breath and pneumonia. Subsequent encounter. EXAM: PORTABLE CHEST 1 VIEW COMPARISON:  11/01/2016 chest x-ray.  10/23/2016 chest CT. FINDINGS: Progressive asymmetric pulmonary edema. Consolidation left mid to lower lobe may represent infiltrate and/or atelectasis with associated pleural effusion. Follow-up until clearance recommended to exclude malignancy. No pneumothorax. Cardiomegaly. CT detected pericardial effusion not adequately assessed by plain film exam. IMPRESSION: Progressive asymmetric pulmonary edema. Consolidation left mid to lower lobe may represent infiltrate and/or atelectasis with associated pleural effusion. Cardiomegaly. CT detected pericardial effusion not adequately assessed by plain film exam. Electronically Signed   By: Genia Del M.D.   On: 11/02/2016 07:53   Dg Chest Port 1 View  Result Date: 11/01/2016 CLINICAL DATA:  Follow-up left lower lobe pneumonia, shortness of breath, history of leukemia EXAM: PORTABLE CHEST 1 VIEW COMPARISON:  Portable chest x-ray of October 31, 2016 FINDINGS: The lungs are less well inflated today. The interstitial markings have increased throughout the left lung. Coarse lung markings at the right lung base are stable. There is mild elevation of the dome of the right hemidiaphragm. The cardiac silhouette is enlarged. The pulmonary vascularity is mildly engorged. IMPRESSION: CHF with mild pulmonary interstitial edema. Pneumonia or atelectasis in the left mid and lower lung and at the right lung base. The appearance of the chest has deteriorated slightly since yesterday's study. Electronically Signed   By: David  Martinique M.D.   On: 11/01/2016 07:45   Scheduled Meds: . aspirin  325 mg Oral Daily  . dextromethorphan-guaiFENesin  1 tablet Oral BID  . enoxaparin (LOVENOX)  injection  40 mg Subcutaneous Q24H  .  escitalopram  20 mg Oral QHS  . ipratropium-albuterol  3 mL Nebulization TID  . levothyroxine  25 mcg Oral QAC breakfast  . naproxen  250 mg Oral BID WC  . pantoprazole  40 mg Oral Daily  . piperacillin-tazobactam (ZOSYN)  IV  3.375 g Intravenous Q8H  . vancomycin  1,000 mg Intravenous Q12H   Continuous Infusions:   LOS: 10 days   Kerney Elbe, DO [facility] Pager [phone]  If 7PM-7AM, please contact night-coverage [email or web address] Password [facility] 11/02/2016, 8:42 PM

## 2016-11-02 NOTE — Progress Notes (Signed)
[address]       [place] [address]             [phone]      8 Days Post-Op Procedure(s) (LRB): VIDEO ASSISTED THORACOSCOPY (VATS), BRONCH,DRAINAGE OF PLEURAL EFFUSION,PERICARDIAL WINDOW WITH DRAINAGE OF PERICARDIAL FLUID, TEE (Left) VIDEO BRONCHOSCOPY (N/A) Subjective: Feels uncomfortable cdiff order was cancelled by someone else  Objective: Vital signs in last 24 hours: Temp:  [97.3 F (36.3 C)-98.5 F (36.9 C)] 97.3 F (36.3 C) (11/21 0429) Pulse Rate:  [72-101] 72 (11/21 0429) Cardiac Rhythm: Normal sinus rhythm (11/21 0700) Resp:  [18] 18 (11/21 0429) BP: (142-150)/(52-61) 142/52 (11/21 0429) SpO2:  [91 %-100 %] 98 % (11/21 0725)  Hemodynamic parameters for last 24 hours:    Intake/Output from previous day: 11/20 0701 - 11/21 0700 In: 600 [P.O.:600] Out: 600 [Urine:600] Intake/Output this shift: No intake/output data recorded.  General appearance: alert, cooperative and no distress Heart: regular rate and rhythm Lungs: dim in left base Wound: incis healing well  Lab Results:  Recent Labs  11/01/16 0232 11/02/16 0332  WBC 11.1* 11.4*  HGB 9.4* 9.3*  HCT 31.4* 30.4*  PLT 421* 437*   BMET:  Recent Labs  11/01/16 0232 11/02/16 0332  NA 140 142  K 3.5 4.4  CL 103 103  CO2 29 31  GLUCOSE 110* 98  BUN 8 6  CREATININE 0.87 0.97  CALCIUM 8.0* 8.3*    PT/INR: No results for input(s): LABPROT, INR in the last 72 hours. ABG    Component Value Date/Time   PHART 7.313 (L) 10/26/2016 0300   HCO3 24.4 10/26/2016 0300   ACIDBASEDEF 1.0 10/26/2016 0300   O2SAT 98.1 10/26/2016 0300   CBG (last 3)   Recent Labs  10/30/16 2228 10/31/16 0641 10/31/16 1126  GLUCAP 112* 97 119*    Meds Scheduled Meds: . aspirin  325 mg Oral Daily  . bisacodyl  10 mg Oral Daily  . dextromethorphan-guaiFENesin  1 tablet Oral BID  . enoxaparin (LOVENOX) injection  40 mg Subcutaneous Q24H  . escitalopram  20 mg Oral QHS  .  ipratropium-albuterol  3 mL Nebulization TID  . levothyroxine  25 mcg Oral QAC breakfast  . pantoprazole  40 mg Oral Daily  . piperacillin-tazobactam (ZOSYN)  IV  3.375 g Intravenous Q8H  . senna-docusate  1 tablet Oral QHS  . vancomycin  1,000 mg Intravenous Q12H   Continuous Infusions: PRN Meds:.ALPRAZolam, alum & mag hydroxide-simeth, diphenhydrAMINE **OR** diphenhydrAMINE, ipratropium-albuterol, menthol-cetylpyridinium, ondansetron **OR** ondansetron (ZOFRAN) IV, oxyCODONE, traMADol  Xrays Dg Chest Port 1 View  Result Date: 11/01/2016 CLINICAL DATA:  Follow-up left lower lobe pneumonia, shortness of breath, history of leukemia EXAM: PORTABLE CHEST 1 VIEW COMPARISON:  Portable chest x-ray of October 31, 2016 FINDINGS: The lungs are less well inflated today. The interstitial markings have increased throughout the left lung. Coarse lung markings at the right lung base are stable. There is mild elevation of the dome of the right hemidiaphragm. The cardiac silhouette is enlarged. The pulmonary vascularity is mildly engorged. IMPRESSION: CHF with mild pulmonary interstitial edema. Pneumonia or atelectasis in the left mid and lower lung and at the right lung base. The appearance of the chest has deteriorated slightly since yesterday's study. Electronically Signed   By: David  Martinique M.D.   On: 11/01/2016 07:45   Dg Chest Port 1 View  Result Date: 10/31/2016 CLINICAL DATA:  Shortness of breath EXAM: PORTABLE CHEST 1 VIEW COMPARISON:  October 30, 2016 FINDINGS: Right basilar atelectasis. Hazy and patchy opacity in left mid and lower lung is unchanged. Cardiomediastinal silhouette is stable. No pneumothorax. No other changes. IMPRESSION: Stable right basilar atelectasis and left-sided opacity. Electronically Signed   By: Dorise Bullion III M.D   On: 10/31/2016 13:29    Assessment/Plan: S/P Procedure(s) (LRB): VIDEO ASSISTED THORACOSCOPY (VATS), BRONCH,DRAINAGE OF PLEURAL EFFUSION,PERICARDIAL  WINDOW WITH DRAINAGE OF PERICARDIAL FLUID, TEE (Left) VIDEO BRONCHOSCOPY (N/A)  1  stable with some neuropathic referred chest discomfort- will add naprosyn for short duration 2 cdiff cancelled by someone- some loose stools, d/c laxatives and stool softeners for now 3 home when ok with medicine team   LOS: 10 days    Andrea Martinez E 11/02/2016

## 2016-11-03 ENCOUNTER — Inpatient Hospital Stay (HOSPITAL_COMMUNITY): Payer: Medicare Other

## 2016-11-03 LAB — COMPREHENSIVE METABOLIC PANEL
ALBUMIN: 2.1 g/dL — AB (ref 3.5–5.0)
ALK PHOS: 118 U/L (ref 38–126)
ALT: 19 U/L (ref 14–54)
ANION GAP: 7 (ref 5–15)
AST: 26 U/L (ref 15–41)
BUN: 5 mg/dL — AB (ref 6–20)
CO2: 32 mmol/L (ref 22–32)
Calcium: 8.7 mg/dL — ABNORMAL LOW (ref 8.9–10.3)
Chloride: 107 mmol/L (ref 101–111)
Creatinine, Ser: 1.1 mg/dL — ABNORMAL HIGH (ref 0.44–1.00)
GFR calc Af Amer: 57 mL/min — ABNORMAL LOW (ref >60)
GFR, EST NON AFRICAN AMERICAN: 49 mL/min — AB (ref >60)
GLUCOSE: 109 mg/dL — AB (ref 65–99)
Potassium: 5 mmol/L (ref 3.5–5.1)
Sodium: 146 mmol/L — ABNORMAL HIGH (ref 135–145)
TOTAL PROTEIN: 5.2 g/dL — AB (ref 6.5–8.1)
Total Bilirubin: 0.4 mg/dL (ref 0.3–1.2)

## 2016-11-03 LAB — CBC WITH DIFFERENTIAL/PLATELET
BASOS PCT: 0 %
Basophils Absolute: 0 10*3/uL (ref 0.0–0.1)
Eosinophils Absolute: 0.4 10*3/uL (ref 0.0–0.7)
Eosinophils Relative: 4 %
HCT: 30.3 % — ABNORMAL LOW (ref 36.0–46.0)
HEMOGLOBIN: 9.1 g/dL — AB (ref 12.0–15.0)
LYMPHS ABS: 1.5 10*3/uL (ref 0.7–4.0)
LYMPHS PCT: 15 %
MCH: 27.2 pg (ref 26.0–34.0)
MCHC: 30 g/dL (ref 30.0–36.0)
MCV: 90.4 fL (ref 78.0–100.0)
MONO ABS: 1.1 10*3/uL — AB (ref 0.1–1.0)
MONOS PCT: 11 %
NEUTROS ABS: 7.1 10*3/uL (ref 1.7–7.7)
NEUTROS PCT: 70 %
Platelets: 438 10*3/uL — ABNORMAL HIGH (ref 150–400)
RBC: 3.35 MIL/uL — ABNORMAL LOW (ref 3.87–5.11)
RDW: 14.6 % (ref 11.5–15.5)
WBC: 10.1 10*3/uL (ref 4.0–10.5)

## 2016-11-03 LAB — VANCOMYCIN, TROUGH: Vancomycin Tr: 26 ug/mL (ref 15–20)

## 2016-11-03 LAB — MAGNESIUM: MAGNESIUM: 2.2 mg/dL (ref 1.7–2.4)

## 2016-11-03 LAB — PHOSPHORUS: Phosphorus: 4.6 mg/dL (ref 2.5–4.6)

## 2016-11-03 MED ORDER — VANCOMYCIN HCL IN DEXTROSE 750-5 MG/150ML-% IV SOLN
750.0000 mg | Freq: Two times a day (BID) | INTRAVENOUS | Status: DC
  Filled 2016-11-03: qty 150

## 2016-11-03 NOTE — Progress Notes (Addendum)
SATURATION QUALIFICATIONS: (This note is used to comply with regulatory documentation for home oxygen)  Patient Saturations on Room Air at Rest = 96%  Patient Saturations on Room Air while Ambulating = 87%  Patient Saturations while ambulating with oxygen on 2L 95%.    Please briefly explain why patient needs home oxygen:   Patients oxygen sats on RA at rest were 98%. During our walk patient dropped to 87% and became short of breath and increased HR to low 100s. Back to room, sitting her 02 stats increased to 94% on room air.

## 2016-11-03 NOTE — Progress Notes (Addendum)
[place] [address]       [place] [address]             [phone]      9 Days Post-Op Procedure(s) (LRB): VIDEO ASSISTED THORACOSCOPY (VATS), BRONCH,DRAINAGE OF PLEURAL EFFUSION,PERICARDIAL WINDOW WITH DRAINAGE OF PERICARDIAL FLUID, TEE (Left) VIDEO BRONCHOSCOPY (N/A) Subjective: Soreness persists  Objective: Vital signs in last 24 hours: Temp:  [97.8 F (36.6 C)-98 F (36.7 C)] 97.8 F (36.6 C) (11/22 0437) Pulse Rate:  [65-85] 65 (11/22 0437) Cardiac Rhythm: Normal sinus rhythm (11/22 0701) Resp:  [18] 18 (11/22 0437) BP: (129-140)/(50-60) 129/56 (11/22 0437) SpO2:  [96 %-100 %] 100 % (11/22 0437)  Hemodynamic parameters for last 24 hours:    Intake/Output from previous day: 11/21 0701 - 11/22 0700 In: 960 [P.O.:960] Out: 600 [Urine:600] Intake/Output this shift: No intake/output data recorded.  General appearance: alert, cooperative and no distress Heart: regular rate and rhythm Lungs: mildly dim in left base Wound: incis healing well  Lab Results:  Recent Labs  11/02/16 0332 11/03/16 0340  WBC 11.4* 10.1  HGB 9.3* 9.1*  HCT 30.4* 30.3*  PLT 437* 438*   BMET:  Recent Labs  11/02/16 0332 11/03/16 0340  NA 142 146*  K 4.4 5.0  CL 103 107  CO2 31 32  GLUCOSE 98 109*  BUN 6 5*  CREATININE 0.97 1.10*  CALCIUM 8.3* 8.7*    PT/INR: No results for input(s): LABPROT, INR in the last 72 hours. ABG    Component Value Date/Time   PHART 7.313 (L) 10/26/2016 0300   HCO3 24.4 10/26/2016 0300   ACIDBASEDEF 1.0 10/26/2016 0300   O2SAT 98.1 10/26/2016 0300   CBG (last 3)   Recent Labs  10/31/16 1126  GLUCAP 119*    Meds Scheduled Meds: . aspirin  325 mg Oral Daily  . dextromethorphan-guaiFENesin  1 tablet Oral BID  . enoxaparin (LOVENOX) injection  40 mg Subcutaneous Q24H  . escitalopram  20 mg Oral QHS  . ipratropium-albuterol  3 mL Nebulization TID  . levothyroxine  25 mcg Oral QAC breakfast  . naproxen  250 mg Oral BID  WC  . pantoprazole  40 mg Oral Daily  . piperacillin-tazobactam (ZOSYN)  IV  3.375 g Intravenous Q8H  . vancomycin  750 mg Intravenous Q12H   Continuous Infusions: PRN Meds:.ALPRAZolam, alum & mag hydroxide-simeth, diphenhydrAMINE **OR** diphenhydrAMINE, ipratropium-albuterol, menthol-cetylpyridinium, ondansetron **OR** ondansetron (ZOFRAN) IV, oxyCODONE, traMADol  Xrays Dg Chest Port 1 View  Result Date: 11/02/2016 CLINICAL DATA:  70 year old female with shortness of breath and pneumonia. Subsequent encounter. EXAM: PORTABLE CHEST 1 VIEW COMPARISON:  11/01/2016 chest x-ray.  10/23/2016 chest CT. FINDINGS: Progressive asymmetric pulmonary edema. Consolidation left mid to lower lobe may represent infiltrate and/or atelectasis with associated pleural effusion. Follow-up until clearance recommended to exclude malignancy. No pneumothorax. Cardiomegaly. CT detected pericardial effusion not adequately assessed by plain film exam. IMPRESSION: Progressive asymmetric pulmonary edema. Consolidation left mid to lower lobe may represent infiltrate and/or atelectasis with associated pleural effusion. Cardiomegaly. CT detected pericardial effusion not adequately assessed by plain film exam. Electronically Signed   By: Genia Del M.D.   On: 11/02/2016 07:53    Assessment/Plan: S/P Procedure(s) (LRB): VIDEO ASSISTED THORACOSCOPY (VATS), BRONCH,DRAINAGE OF PLEURAL EFFUSION,PERICARDIAL WINDOW WITH DRAINAGE OF PERICARDIAL FLUID, TEE (Left) VIDEO BRONCHOSCOPY (N/A)  1 stable- short coarse of nsaid, watch creat 2 CXR is stable in appearance 3 cdiff negative 4 medical management per primary   LOS: 11 days  GOLD,WAYNE E 11/03/2016 Patient seen and examined, agree with above I will be away for the holiday weekend. My partners are available if needed, please call. I will need to see her back in 2-3 weeks after discharge  Remo Lipps C. Roxan Hockey, MD [facility] [phone]

## 2016-11-03 NOTE — Progress Notes (Signed)
PROGRESS NOTE    Andrea Martinez  [id number] DOB: 07-30-1946 DOA: 10/23/2016 PCP: [name], MD  Brief Narrative:  [name] a 70 y.o.femalewith a history of CML on chemotherapy, hypothyroidism, leukocytosis. She presents with 3 days of worsening shortness of breath acutely worse today. She wasdyspneic on exertion to 10 of 15 feet. Improves at rest and on oxygen. Patient even has dyspnea with getting out of bed. Due to worsening symptoms, the patient presented to the hospital for evaluation and was found to have large Left Pleural Effusion and Pericardial Effusion that is now s/p VATS with Drainage, and Drainage of Pericardial Fluid with Window and Biopsy of Pericardium. Patient still complains of having some Nausea however upon prior review of records she has had chronic Nausea. Patient desaturated today to 85% on Room Air so a CXR was obtained and showed likely a PNA. Compared to yesterday's CXR was Stable. Patient will be scheduled on Breathing treatments and Ambulated. Patient complaining of Loose Stools and continued pain on her Rib this Am.   Assessment & Plan:   Principal Problem:   Pleural effusion Active Problems:   Chronic myelogenous leukemia (CML), BCR-ABL1-positive (HCC)   Solitary pulmonary nodule   Left Lung Health-Care Associated Pneumonia -CXR 10/30/2016 showed Diffuse left lung airspace disease concerning for pneumonia. Right base atelectasis. - Bronchial washing Growing rare gram positive cocci in chains and pair and culture consistent with Normal Respiratory Flora.  MRSA was Negative. -will need home oxygen.  -chest x ray stable.  Marlana Martinez has received 10 days of  Vancomycin and Zosyn. Will stop antibiotics.  - C/wDuoNeb Breathing Tx q6h and q2hprn - C/w Mucinex for Expectorant  Left Pleural effusion, pericardial effusion s/p VATS, Drainage of Effusion, Pericardial Window, and Biopsy  - CVTS consulted, patient underwent Video-bronchoscopy left,  drainage left pleural effusion, drainage of pericardial fluid Window. S/P biopsy of pleural, pericardium 11-13. Tube management per TCV - Removed all tubes Biopsy Reults revealed: -Biopsy consistent with chronic inflammation.   -Bronchial lavage culture; grew normal oral flora.  - Per CT Surgery Making good Surgical Recovery and Ok to D/C Home from Surgical Standpoint.  -chest x ray this morning stable.   Mild increase cr, hypernatremia; I have encourage patient to drink plenty of fluids. Repeat labs in am.   Chest Discomfort - Troponin negative. Recent TEE showed EF of 50-55 % - Continues to have chest discomfort/pain where Chest Tubes used to be. ? Whether it is Pleuritic Pain from PNA - Pain Control with Oxy IR 5 mg po q4hprn, Tramadol 50 mg po q6hprn - CT Surgery started Naprosyn for a few days.  Hypothyroidism - Not on Home medications. - TSH with significant elevation to 24.319 - Free T4 was 0.83; - C/w Levothyroxine at 25 mcg daily  CML - Hold Chemotherapy for now, will defer to Dr. Whitney Muse when this can be resumed in outpatient setting. Will Call [facility] at [phone] to discuss with Dr. Whitney Muse.   Loose Stools - Patient is Afebrile. States feels similar to he episodes of IBS - C. Difficile negative   Solitary Pulmonary Nodule  - Will need follow up CT per Dr. Luan Pulling  Nausea and Sore Throat - Evaluation of the throat revealed no Erythema - Added Cepacol 3 mg Lozenges - C/w Zofran 4 mg po q6hprn or IV 4 mg q6hprn - Not new from review of Dr. Donald Pore notes.   Depression/Anxiety - C/w Alprazolam 0.25 mg po q8hprn for anxiety/sleep -  C/w Escitalopram 20 mg po Daily qHS.   DVT prophylaxis: Lovenox Code Status: FULL Family Communication: Discussed with Husband at Bedside Disposition Plan: home 11-23 if renal function stable.   Consultants:   Cardiothroacic Surgery  Procedures:  VATS, bronchoscopy, pericardial window and  biopsy Echocardiogram  Antimicrobials: IV Vancomycin and IV Zosyn 11/13 -->  Subjective: She is feeling better, breathing better. Has had 3 loose stool.    Objective: Vitals:   11/02/16 1937 11/02/16 2002 11/03/16 0437 11/03/16 0823  BP:  (!) 137/50 (!) 129/56   Pulse: 84 85 65   Resp: 18 18 18    Temp:  98 F (36.7 C) 97.8 F (36.6 C)   TempSrc:  Oral Oral   SpO2: 97% 99% 100% 99%  Weight:      Height:        Intake/Output Summary (Last 24 hours) at 11/03/16 1316 Last data filed at 11/03/16 0800  Gross per 24 hour  Intake              720 ml  Output              200 ml  Net              520 ml   Filed Weights   10/23/16 1415  Weight: 88 kg (194 lb)    Examination: Physical Exam:  Constitutional: WN/WD, NAD and appears calm and comfortable Eyes:  Lids and conjunctivae normal, sclerae anicteric  ENMT: External Ears, Nose appear normal. Grossly normal hearing.  Neck: Appears normal, supple, no cervical masses, normal ROM, no appreciable thyromegaly Chest Wall: Pain on palpation on Rib Respiratory: Diminished but clear to auscultation bilaterally with worse on Left than right. No appreciable wheezing, rales, rhonchi or crackles. Normal respiratory effort and patient is not tachypenic. No accessory muscle use. Chest Tubes removed. Chest Wound Covered and Dresssing C/D/I. Wearing O2 via [place].  Cardiovascular: RRR, no murmurs / rubs / gallops. S1 and S2 auscultated. No extremity edema.  Abdomen: Soft, non-tender, non-distended. No masses palpated. No appreciable hepatosplenomegaly. Bowel sounds positive x4.  Musculoskeletal: No clubbing / cyanosis of digits/nails. No joint deformity upper and lower extremities.  Skin: No rashes, lesions, ulcers. No induration; Warm and dry.  Psychiatric: Normal judgment and insight. Alert and oriented x 3. Normal mood and appropriate affect.   Data Reviewed: I have personally reviewed following labs and imaging studies  CBC:  Recent  Labs Lab 10/30/16 0531 10/31/16 0751 11/01/16 0232 11/02/16 0332 11/03/16 0340  WBC 11.9* 13.8* 11.1* 11.4* 10.1  NEUTROABS 8.3* 10.2* 8.2* 7.9* 7.1  HGB 10.0* 10.9* 9.4* 9.3* 9.1*  HCT 32.3* 34.5* 31.4* 30.4* 30.3*  MCV 89.2 88.2 89.2 89.9 90.4  PLT 374 491* 421* 437* [id number]*   Basic Metabolic Panel:  Recent Labs Lab 10/30/16 0531 10/31/16 0751 11/01/16 0232 11/02/16 0332 11/03/16 0340  NA 144 138 140 142 146*  K 4.1 3.7 3.5 4.4 5.0  CL 107 102 103 103 107  CO2 29 26 29 31  32  GLUCOSE 102* 115* 110* 98 109*  BUN 9 8 8 6  5*  CREATININE 0.81 0.91 0.87 0.97 1.10*  CALCIUM 8.1* 8.3* 8.0* 8.3* 8.7*  MG 2.0 2.1 1.9 2.0 2.2  PHOS 4.4 3.3 2.7 3.4 4.6   GFR: Estimated Creatinine Clearance: 52.4 mL/min (by C-G formula based on SCr of 1.1 mg/dL (H)). Liver Function Tests:  Recent Labs Lab 10/30/16 0531 10/31/16 0751 11/01/16 0232 11/02/16 0332 11/03/16 0340  AST 40 39 30  34 26  ALT 27 28 24 21 19   ALKPHOS 111 154* 127* 122 118  BILITOT 0.3 0.4 0.3 0.9 0.4  PROT 4.9* 5.8* 5.4* 5.2* 5.2*  ALBUMIN 2.0* 2.4* 2.2* 2.1* 2.1*   No results for input(s): LIPASE, AMYLASE in the last 168 hours. No results for input(s): AMMONIA in the last 168 hours. Coagulation Profile: No results for input(s): INR, PROTIME in the last 168 hours. Cardiac Enzymes: No results for input(s): CKTOTAL, CKMB, CKMBINDEX, TROPONINI in the last 168 hours. BNP (last 3 results) No results for input(s): PROBNP in the last 8760 hours. HbA1C: No results for input(s): HGBA1C in the last 72 hours. CBG:  Recent Labs Lab 10/30/16 1157 10/30/16 1701 10/30/16 2228 10/31/16 0641 10/31/16 1126  GLUCAP 105* 90 112* 97 119*   Lipid Profile: No results for input(s): CHOL, HDL, LDLCALC, TRIG, CHOLHDL, LDLDIRECT in the last 72 hours. Thyroid Function Tests: No results for input(s): TSH, T4TOTAL, FREET4, T3FREE, THYROIDAB in the last 72 hours. Anemia Panel: No results for input(s): VITAMINB12, FOLATE,  FERRITIN, TIBC, IRON, RETICCTPCT in the last 72 hours. Sepsis Labs: No results for input(s): PROCALCITON, LATICACIDVEN in the last 168 hours.  Recent Results (from the past 240 hour(s))  Urine culture     Status: None   Collection Time: 10/25/16 10:44 AM  Result Value Ref Range Status   Specimen Description URINE, RANDOM  Final   Special Requests NONE  Final   Culture NO GROWTH  Final   Report Status 10/26/2016 FINAL  Final  Gram stain     Status: None   Collection Time: 10/25/16  1:55 PM  Result Value Ref Range Status   Specimen Description FLUID LEFT PLEURAL  Final   Special Requests NONE  Final   Gram Stain   Final    CYTOSPIN SMEAR WBC PRESENT,BOTH PMN AND MONONUCLEAR NO ORGANISMS SEEN    Report Status 10/25/2016 FINAL  Final  Culture, body fluid-bottle     Status: None   Collection Time: 10/25/16  1:55 PM  Result Value Ref Range Status   Specimen Description FLUID PLEURAL LEFT  Final   Special Requests BOTTLES DRAWN AEROBIC AND ANAEROBIC 10CC  Final   Culture NO GROWTH 5 DAYS  Final   Report Status 10/30/2016 FINAL  Final  Culture, respiratory (NON-Expectorated)     Status: None   Collection Time: 10/25/16  2:10 PM  Result Value Ref Range Status   Specimen Description BRONCHIAL WASHINGS LEFT  Final   Special Requests PATIENT ON FOLLOWING  VANCY  Final   Gram Stain   Final    RARE WBC PRESENT, PREDOMINANTLY MONONUCLEAR RARE GRAM POSITIVE COCCI IN PAIRS AND CHAINS    Culture Consistent with normal respiratory flora.  Final   Report Status 10/27/2016 FINAL  Final  MRSA PCR Screening     Status: None   Collection Time: 10/26/16  4:12 AM  Result Value Ref Range Status   MRSA by PCR NEGATIVE NEGATIVE Final    Comment:        The GeneXpert MRSA Assay (FDA approved for NASAL specimens only), is one component of a comprehensive MRSA colonization surveillance program. It is not intended to diagnose MRSA infection nor to guide or monitor treatment for MRSA  infections.   C difficile quick scan w PCR reflex     Status: None   Collection Time: 11/02/16  4:15 PM  Result Value Ref Range Status   C Diff antigen NEGATIVE NEGATIVE Final   C Diff  toxin NEGATIVE NEGATIVE Final   C Diff interpretation No C. difficile detected.  Final    Radiology Studies: Dg Chest 2 View  Result Date: 11/03/2016 CLINICAL DATA:  Pleural effusion and left-sided pneumonia EXAM: CHEST  2 VIEW COMPARISON:  11/02/2016 FINDINGS: Cardiac shadow remains enlarged. Left-sided pleural effusion is again identified with patchy infiltrative changes. Mild right basilar atelectasis is noted. No acute bony abnormality is seen. IMPRESSION: Stable changes on the left with new right basilar atelectasis. Electronically Signed   By: Inez Catalina M.D.   On: 11/03/2016 08:09   Dg Chest Port 1 View  Result Date: 11/02/2016 CLINICAL DATA:  70 year old female with shortness of breath and pneumonia. Subsequent encounter. EXAM: PORTABLE CHEST 1 VIEW COMPARISON:  11/01/2016 chest x-ray.  10/23/2016 chest CT. FINDINGS: Progressive asymmetric pulmonary edema. Consolidation left mid to lower lobe may represent infiltrate and/or atelectasis with associated pleural effusion. Follow-up until clearance recommended to exclude malignancy. No pneumothorax. Cardiomegaly. CT detected pericardial effusion not adequately assessed by plain film exam. IMPRESSION: Progressive asymmetric pulmonary edema. Consolidation left mid to lower lobe may represent infiltrate and/or atelectasis with associated pleural effusion. Cardiomegaly. CT detected pericardial effusion not adequately assessed by plain film exam. Electronically Signed   By: Genia Del M.D.   On: 11/02/2016 07:53   Scheduled Meds: . aspirin  325 mg Oral Daily  . dextromethorphan-guaiFENesin  1 tablet Oral BID  . enoxaparin (LOVENOX) injection  40 mg Subcutaneous Q24H  . escitalopram  20 mg Oral QHS  . ipratropium-albuterol  3 mL Nebulization TID  .  levothyroxine  25 mcg Oral QAC breakfast  . naproxen  250 mg Oral BID WC  . pantoprazole  40 mg Oral Daily   Continuous Infusions:   LOS: 11 days   Niel Hummer A,MD [facility] Pager [phone]  If 7PM-7AM, please contact night-coverage [email or web address] Password [email or web address] 11/03/2016, 1:16 PM

## 2016-11-03 NOTE — Care Management Note (Signed)
Case Management Note Previous CM note initiated by Zenon Mayo, RN-10/27/2016, 5:10 PM   Patient Details  Name: Andrea Martinez MRN: [id number] Date of Birth: [birth year]  Subjective/Objective:    S/p Vats, drainage of pl effusion, pericardial window, bp soft today, pericardial drain and anterior chest tube dc today, posterior chest tube in place. Conts on iv abx.  She lives with husband, pta indep, she has pcp, and medication coverage and transportation at dc.  NCM will cont to follow for dc needs.                 Action/Plan:   Expected Discharge Date:    11/04/16              Expected Discharge Plan:  Home/Self Care  In-House Referral:     Discharge planning Services  CM Consult  Post Acute Care Choice:  Durable Medical Equipment Choice offered to:  Patient  DME Arranged:  Oxygen DME Agency:  [place]:    [facility] Agency:     Status of Service:  Completed, signed off  If discussed at [place] of Stay Meetings, dates discussed:    Additional Comments:  11/03/16- 1330- Marvetta Gibbons RN, CM- pt for possible d/c 11/23- will need home 02- order has been placed- spoke with Larene Beach with [facility] for DME need- portable tank to be delivered to room prior to discharge.   Dahlia Client Matthews, RN 11/03/2016, 1:36 PM [phone]

## 2016-11-03 NOTE — Progress Notes (Signed)
Critical value received:  vancomycin trough 26.  AM dose d/c'd per pharmacy.  NP Schorr aware.  RN will continue to monitor.  Claudette Stapler, RN

## 2016-11-03 NOTE — Progress Notes (Signed)
Pharmacy Antibiotic Note  Andrea Martinez is a 70 y.o. female admitted on 10/23/2016 with s/p VATS 11/13 with concern for PNA.  Pharmacy has been consulted for Vancomycin dosing. WBC WNL. Slight trend up in Scr to 1.1, vancomycin trough is elevated at 26.   Plan: -Reduce vancomycin to 750 mg IV q12h, to start later today -Monitor Scr closely, will likely need further dose reduction with any increases  Height: 5\' 6"  (167.6 cm) Weight: 194 lb (88 kg) IBW/kg (Calculated) : 59.3  Temp (24hrs), Avg:98 F (36.7 C), Min:97.9 F (36.6 C), Max:98 F (36.7 C)   Recent Labs Lab 10/30/16 0531 10/31/16 0751 11/01/16 0232 11/02/16 0332 11/03/16 0340  WBC 11.9* 13.8* 11.1* 11.4* 10.1  CREATININE 0.81 0.91 0.87 0.97 1.10*  VANCOTROUGH  --   --   --   --  26*    Estimated Creatinine Clearance: 52.4 mL/min (by C-G formula based on SCr of 1.1 mg/dL (H)).    Allergies  Allergen Reactions  . Codeine Nausea Only    Headache  . Esomeprazole Magnesium Nausea Only  . Sulfonamide Derivatives Nausea Only    Headache  . Fluconazole Rash    Redness and blistering on left thigh    Andrea Martinez 11/03/2016 4:44 AM

## 2016-11-04 LAB — URINALYSIS, ROUTINE W REFLEX MICROSCOPIC
BILIRUBIN URINE: NEGATIVE
GLUCOSE, UA: NEGATIVE mg/dL
Hgb urine dipstick: NEGATIVE
KETONES UR: NEGATIVE mg/dL
Nitrite: NEGATIVE
PH: 7.5 (ref 5.0–8.0)
PROTEIN: NEGATIVE mg/dL
Specific Gravity, Urine: 1.013 (ref 1.005–1.030)

## 2016-11-04 LAB — URINE MICROSCOPIC-ADD ON

## 2016-11-04 LAB — BASIC METABOLIC PANEL
Anion gap: 8 (ref 5–15)
BUN: 7 mg/dL (ref 6–20)
CALCIUM: 8.6 mg/dL — AB (ref 8.9–10.3)
CHLORIDE: 106 mmol/L (ref 101–111)
CO2: 29 mmol/L (ref 22–32)
CREATININE: 1.13 mg/dL — AB (ref 0.44–1.00)
GFR calc non Af Amer: 48 mL/min — ABNORMAL LOW (ref >60)
GFR, EST AFRICAN AMERICAN: 55 mL/min — AB (ref >60)
Glucose, Bld: 105 mg/dL — ABNORMAL HIGH (ref 65–99)
Potassium: 4.1 mmol/L (ref 3.5–5.1)
SODIUM: 143 mmol/L (ref 135–145)

## 2016-11-04 LAB — CBC
HEMATOCRIT: 33.6 % — AB (ref 36.0–46.0)
Hemoglobin: 10.3 g/dL — ABNORMAL LOW (ref 12.0–15.0)
MCH: 27.4 pg (ref 26.0–34.0)
MCHC: 30.7 g/dL (ref 30.0–36.0)
MCV: 89.4 fL (ref 78.0–100.0)
PLATELETS: 514 10*3/uL — AB (ref 150–400)
RBC: 3.76 MIL/uL — ABNORMAL LOW (ref 3.87–5.11)
RDW: 14.9 % (ref 11.5–15.5)
WBC: 11.1 10*3/uL — ABNORMAL HIGH (ref 4.0–10.5)

## 2016-11-04 LAB — GLUCOSE, CAPILLARY: GLUCOSE-CAPILLARY: 94 mg/dL (ref 65–99)

## 2016-11-04 MED ORDER — AMOXICILLIN-POT CLAVULANATE 875-125 MG PO TABS: 1.0000 | ORAL_TABLET | Freq: Two times a day (BID) | ORAL | 0 refills | Status: DC

## 2016-11-04 MED ORDER — SODIUM CHLORIDE 0.9% FLUSH: 10.0000 mL | INTRAVENOUS | Status: DC | PRN

## 2016-11-04 MED ORDER — DM-GUAIFENESIN ER 30-600 MG PO TB12: 1.0000 | ORAL_TABLET | Freq: Two times a day (BID) | ORAL | 0 refills | Status: DC | PRN

## 2016-11-04 MED ORDER — LEVOTHYROXINE SODIUM 25 MCG PO TABS: 25.0000 ug | ORAL_TABLET | Freq: Every day | ORAL | 0 refills | Status: DC

## 2016-11-04 MED ORDER — AMOXICILLIN-POT CLAVULANATE 875-125 MG PO TABS
1.0000 | ORAL_TABLET | Freq: Two times a day (BID) | ORAL | Status: DC
  Administered 2016-11-04: 1 via ORAL
  Filled 2016-11-04: qty 1

## 2016-11-04 MED ORDER — OXYCODONE HCL 5 MG PO TABS: 5.0000 mg | ORAL_TABLET | ORAL | 0 refills | Status: DC | PRN

## 2016-11-04 MED ORDER — ASPIRIN 325 MG PO TABS: 325.0000 mg | ORAL_TABLET | Freq: Every day | ORAL | 0 refills | Status: DC

## 2016-11-04 MED ORDER — IPRATROPIUM-ALBUTEROL 0.5-2.5 (3) MG/3ML IN SOLN: 3.0000 mL | Freq: Three times a day (TID) | RESPIRATORY_TRACT | 0 refills | Status: DC

## 2016-11-04 MED ORDER — SODIUM CHLORIDE 0.9% FLUSH: 10.0000 mL | Freq: Two times a day (BID) | INTRAVENOUS | Status: DC

## 2016-11-04 NOTE — Progress Notes (Signed)
SATURATION QUALIFICATIONS: (This note is used to comply with regulatory documentation for home oxygen)  Patient Saturations on Room Air at Rest = 98%  Patient Saturations on Room Air while Ambulating = 85%  Patient Saturations on 2 Liters of oxygen while Ambulating = 95%  Please briefly explain why patient needs home oxygen:  Patient becomes short of breath during walk without oxygen.

## 2016-11-04 NOTE — Progress Notes (Signed)
Pt diaphoretic. denies N/V, dizziness. Vitals stable, CBG 94. Pt complaining of dryness in the mouth, burning when urinating, pain and pressure of left side below breast. Pain medicine given. Will continue to monitor. Isac Caddy, RN

## 2016-11-05 NOTE — Discharge Summary (Signed)
Physician Discharge Summary  Andrea Martinez [id number] DOB: 1946-11-15 DOA: 10/23/2016  PCP: [name], MD  Admit date: 10/23/2016 Discharge date: 11/05/2016  Admitted From: Home  Disposition: Home   Recommendations for Outpatient Follow-up:  1. Follow up with PCP in 1-2 weeks 2. Please obtain BMP/CBC in one week   Home Health; Yes.  Equipment/Devices:   Discharge Condition: stable.  CODE STATUS: Full code Diet recommendation: Heart Healthy  Brief/Interim Summary: Brief Narrative:  Angelica Albaladejo Martinis a 70 y.o.femalewith a history of CML on chemotherapy, hypothyroidism, leukocytosis. She presents with 3 days of worsening shortness of breath acutely worse today. She wasdyspneic on exertion to 10 of 15 feet. Improves at rest and on oxygen. Patient even has dyspnea with getting out of bed. Due to worsening symptoms, the patient presented to the hospital for evaluation and was found to have large Left Pleural Effusion and Pericardial Effusion that is now s/p VATS with Drainage, and Drainage of Pericardial Fluid with Window and Biopsy of Pericardium. Patient still complains of having some Nausea however upon prior review of records she has had chronic Nausea. Patient desaturated today to 85% on Room Air so a CXR was obtained and showed likely a PNA. Compared to yesterday's CXR was Stable. Patient will be scheduled on Breathing treatments and Ambulated. Patient complaining of Loose Stools and continued pain on her Rib this Am.   Assessment & Plan:  Left Lung Health-Care Associated Pneumonia -CXR 10/30/2016 showed Diffuse left lung airspace disease concerning for pneumonia. Right base atelectasis. - Bronchial washing Growing rare gram positive cocci in chains and pair and culture consistent with Normal Respiratory Flora.  MRSA was Negative. -will need home oxygen.  -chest x ray stable.  [name] has received 10 days of  Vancomycin and Zosyn. Had some diaphoresis , will prescribe 4  more days of Augmentin.  - C/wDuoNeb Breathing Tx q6h and q2hprn - C/w Mucinex for Expectorant  Left Pleural effusion, pericardial effusion s/p VATS, Drainage of Effusion, Pericardial Window, and Biopsy  - CVTS consulted, patient underwent Video-bronchoscopy left, drainage left pleural effusion, drainage of pericardial fluid Window. S/P biopsy of pleural, pericardium 11-13. Tube management per TCV - Removed all tubes Biopsy Reults revealed: -Biopsy consistent with chronic inflammation.   -Bronchial lavage culture; grew normal oral flora.  - Per CT Surgery Making good Surgical Recovery and Ok to D/C Home from Surgical Standpoint.  -chest x ray this morning stable.  -received 10 days of IV antibiotics, will provide 4 more days of Augmentin.   Mild increase cr, hypernatremia; I have encourage patient to drink plenty of fluids.  resolved.   Chest Discomfort - Troponin negative. Recent TEE showed EF of 50-55 % - Continues to have chest discomfort/pain where Chest Tubes used to be. ? Whether it is Pleuritic Pain from PNA - Pain Control with Oxy IR 5 mg po q4hprn, Tramadol 50 mg po q6hprn - CT Surgery started Naprosyn for a few days.  Hypothyroidism - Not on Home medications. - TSH with significant elevation to 24.319 - Free T4 was 0.83; - C/w Levothyroxine at 25 mcg daily  CML - Hold Chemotherapy for now, will defer to Dr. Whitney Muse when this can be resumed in outpatient setting. Will Call [facility] at [phone] to discuss with Dr. Whitney Muse.   Loose Stools - Patient is Afebrile. States feels similar to he episodes of IBS - C. Difficile negative   Solitary Pulmonary Nodule  - Will need follow up CT per Dr.  Hawkins  Nausea and Sore Throat - Evaluation of the throat revealed no Erythema - Added Cepacol 3 mg Lozenges - C/w Zofran 4 mg po q6hprn or IV 4 mg q6hprn - Not new from review of Dr. Donald Pore notes.   Depression/Anxiety - C/w Alprazolam 0.25 mg po  q8hprn for anxiety/sleep - C/w Escitalopram 20 mg po Daily qHS.    Discharge Diagnoses:  Principal Problem:   Pleural effusion Active Problems:   Chronic myelogenous leukemia (CML), BCR-ABL1-positive (HCC)   Solitary pulmonary nodule    Discharge Instructions  Discharge Instructions    Diet - low sodium heart healthy    Complete by:  As directed    Diet - low sodium heart healthy    Complete by:  As directed    Increase activity slowly    Complete by:  As directed    Increase activity slowly    Complete by:  As directed        Medication List    STOP taking these medications   FLUZONE HIGH-DOSE 0.5 ML Susy Generic drug:  Influenza Vac Split High-Dose   nilotinib 150 MG capsule Commonly known as:  TASIGNA   promethazine 25 MG tablet Commonly known as:  PHENERGAN     TAKE these medications   ALPRAZolam 0.25 MG tablet Commonly known as:  XANAX Take 1 tablet (0.25 mg total) by mouth every 6 (six) hours as needed for anxiety.   amoxicillin-clavulanate 875-125 MG tablet Commonly known as:  AUGMENTIN Take 1 tablet by mouth every 12 (twelve) hours.   aspirin 325 MG tablet Take 1 tablet (325 mg total) by mouth daily.   benzonatate 200 MG capsule Commonly known as:  TESSALON Take 1 capsule (200 mg total) by mouth 3 (three) times daily as needed for cough.   CENTRUM SILVER PO Take 1 capsule by mouth daily.   cetirizine 10 MG tablet Commonly known as:  ZYRTEC Take 10 mg by mouth daily.   conjugated estrogens vaginal cream Commonly known as:  PREMARIN Place 1 Applicatorful vaginally 2 (two) times a week. 1/2 to 1g   dextromethorphan-guaiFENesin 30-600 MG 12hr tablet Commonly known as:  MUCINEX DM Take 1 tablet by mouth 2 (two) times daily as needed for cough.   DIGESTIVE ADVANTAGE GUMMIES PO Take 2 tablets by mouth daily.   escitalopram 20 MG tablet Commonly known as:  LEXAPRO TAKE 1 TABLET (20 MG TOTAL) BY MOUTH DAILY.   ipratropium-albuterol 0.5-2.5  (3) MG/3ML Soln Commonly known as:  DUONEB Take 3 mLs by nebulization 3 (three) times daily.   levothyroxine 25 MCG tablet Commonly known as:  SYNTHROID, LEVOTHROID Take 1 tablet (25 mcg total) by mouth daily before breakfast.   ondansetron 8 MG tablet Commonly known as:  ZOFRAN Take 1 tablet (8 mg total) by mouth every 8 (eight) hours as needed for nausea or vomiting.   oxyCODONE 5 MG immediate release tablet Commonly known as:  Oxy IR/ROXICODONE Take 1 tablet (5 mg total) by mouth every 4 (four) hours as needed for moderate pain or severe pain.   prochlorperazine 10 MG tablet Commonly known as:  COMPAZINE Take 1 tablet (10 mg total) by mouth every 6 (six) hours as needed for nausea or vomiting.   traMADol 50 MG tablet Commonly known as:  ULTRAM Take 1 tablet (50 mg total) by mouth every 6 (six) hours as needed. What changed:  reasons to take this   vitamin B-12 1000 MCG tablet Commonly known as:  CYANOCOBALAMIN Take 5,000 mcg  by mouth daily.   Vitamin D-3 5000 units Tabs Take 5,000 Units by mouth daily.      Follow-up Information    [name], MD Follow up on 11/09/2016.   Specialty:  Cardiothoracic Surgery Why:  PA/LAT CXR to be taken (at [facility] which is in the same building as Dr. Leonarda Salon office) on 11/16/2016 at 9:30 am;Appointment time is at 10:00 am Contact information: [place] [address] [phone]        Nurse Follow up on 11/08/2016.   Why:  Appointment is with nurse only for chest tube suture removal. Appointment time is at 10:30 am Contact information: [address] [place] [address]       [facility] Follow up.   Why:  Home 02 arranged- portable tank to be delivered to room prior to discharge Contact information: [place] [address] [phone]          Allergies  Allergen Reactions  . Codeine Nausea Only    Headache   . Esomeprazole Magnesium Nausea Only  . Sulfonamide Derivatives Nausea Only    Headache  . Fluconazole Rash    Redness and blistering on left thigh    Consultations:  CVTS   Procedures/Studies: Dg Chest 2 View  Result Date: 11/03/2016 CLINICAL DATA:  Pleural effusion and left-sided pneumonia EXAM: CHEST  2 VIEW COMPARISON:  11/02/2016 FINDINGS: Cardiac shadow remains enlarged. Left-sided pleural effusion is again identified with patchy infiltrative changes. Mild right basilar atelectasis is noted. No acute bony abnormality is seen. IMPRESSION: Stable changes on the left with new right basilar atelectasis. Electronically Signed   By: Inez Catalina M.D.   On: 11/03/2016 08:09   Dg Chest 2 View  Result Date: 10/29/2016 CLINICAL DATA:  Pneumothorax EXAM: CHEST  2 VIEW COMPARISON:  10/28/2016 FINDINGS: The left jugular central venous catheter has been removed. The left chest tube is been removed. There is no pneumothorax. Hazy and linear opacities at both lower lung zones left greater than right are stable. The lateral view demonstrates pleural based opacity anteriorly in the thorax raising the possibility of loculated pleural fluid at the anterior left base. There is some blunting of the posterior right costophrenic angle indicating a small right pleural effusion. IMPRESSION: Left chest tube is been removed.  There is no pneumothorax There is the suggestion of loculated pleural fluid at the anterior left base. Small right pleural effusion and basilar atelectasis. Airspace opacities at the left base are difficult to exclude. Electronically Signed   By: Marybelle Killings M.D.   On: 10/29/2016 11:54   Dg Chest 2 View  Result Date: 10/23/2016 CLINICAL DATA:  Shortness of breath and chest pain. EXAM: CHEST  2 VIEW COMPARISON:  November 01, 2011 FINDINGS: No pneumothorax. Significant opacification of the left hemi thorax with only a small amount of apex aerated. On the lateral view, there appears to be a  mass located superiorly measuring 4.8 cm. There is probably associated effusion. No other acute abnormalities. Mild atelectasis in the right base. IMPRESSION: 1. Significant opacification of left hemi thorax with an apparent 4.8 cm mass seen on the lateral view. Probable associated effusion. Recommend CT scan. Electronically Signed   By: Dorise Bullion III M.D   On: 10/23/2016 15:52   Ct Chest W Contrast  Result Date: 10/23/2016 CLINICAL DATA:  Left pleural effusion. Patient is very short of breath. EXAM: CT CHEST WITH  CONTRAST TECHNIQUE: Multidetector CT imaging of the chest was performed during intravenous contrast administration. CONTRAST:  28mL ISOVUE-300 IOPAMIDOL (ISOVUE-300) INJECTION 61% COMPARISON:  08/13/2009. FINDINGS: Cardiovascular: Heart size is normal. Small to moderate pericardial effusion noted. There is abdominal aortic atherosclerosis without aneurysm. No evidence for thoracic aortic dissection. Mediastinum/Nodes: 12 mm short axis subcarinal lymph node is mildly enlarged. 10 mm borderline enlarged short axis lymph node identified right hilum. 10 mm short axis lymph node identified left hilum. Lungs/Pleura: Left lower lobe and lingular collapse is associated with a moderate left pleural effusion demonstrating areas of loculation. No discernible pleural thickening or nodularity. No evidence for right pleural effusion. No focal airspace consolidation on the right. 8 mm right lower lobe pulmonary nodule is seen on image 112 of series 4. Upper Abdomen: Unremarkable Musculoskeletal: Bone windows reveal no worrisome lytic or sclerotic osseous lesions. IMPRESSION: 1. Lingular and left lower lobe collapse associated with moderate loculated left pleural effusion. Although no central obstructing mass lesion is evident, given unilateral character of disease, malignancy must be excluded. 2. Borderline mediastinal and bilateral hilar lymphadenopathy. 3. 8 mm right lower lobe pulmonary nodule. Attention on  follow-up imaging recommended. Electronically Signed   By: Misty Stanley M.D.   On: 10/23/2016 17:25   Dg Chest Port 1 View  Result Date: 11/02/2016 CLINICAL DATA:  70 year old female with shortness of breath and pneumonia. Subsequent encounter. EXAM: PORTABLE CHEST 1 VIEW COMPARISON:  11/01/2016 chest x-ray.  10/23/2016 chest CT. FINDINGS: Progressive asymmetric pulmonary edema. Consolidation left mid to lower lobe may represent infiltrate and/or atelectasis with associated pleural effusion. Follow-up until clearance recommended to exclude malignancy. No pneumothorax. Cardiomegaly. CT detected pericardial effusion not adequately assessed by plain film exam. IMPRESSION: Progressive asymmetric pulmonary edema. Consolidation left mid to lower lobe may represent infiltrate and/or atelectasis with associated pleural effusion. Cardiomegaly. CT detected pericardial effusion not adequately assessed by plain film exam. Electronically Signed   By: Genia Del M.D.   On: 11/02/2016 07:53   Dg Chest Port 1 View  Result Date: 11/01/2016 CLINICAL DATA:  Follow-up left lower lobe pneumonia, shortness of breath, history of leukemia EXAM: PORTABLE CHEST 1 VIEW COMPARISON:  Portable chest x-ray of October 31, 2016 FINDINGS: The lungs are less well inflated today. The interstitial markings have increased throughout the left lung. Coarse lung markings at the right lung base are stable. There is mild elevation of the dome of the right hemidiaphragm. The cardiac silhouette is enlarged. The pulmonary vascularity is mildly engorged. IMPRESSION: CHF with mild pulmonary interstitial edema. Pneumonia or atelectasis in the left mid and lower lung and at the right lung base. The appearance of the chest has deteriorated slightly since yesterday's study. Electronically Signed   By: David  Martinique M.D.   On: 11/01/2016 07:45   Dg Chest Port 1 View  Result Date: 10/31/2016 CLINICAL DATA:  Shortness of breath EXAM: PORTABLE CHEST 1  VIEW COMPARISON:  October 30, 2016 FINDINGS: Right basilar atelectasis. Hazy and patchy opacity in left mid and lower lung is unchanged. Cardiomediastinal silhouette is stable. No pneumothorax. No other changes. IMPRESSION: Stable right basilar atelectasis and left-sided opacity. Electronically Signed   By: Dorise Bullion III M.D   On: 10/31/2016 13:29   Dg Chest Port 1 View  Result Date: 10/30/2016 CLINICAL DATA:  Shortness of Breath EXAM: PORTABLE CHEST 1 VIEW COMPARISON:  10/29/2016 FINDINGS: Cardiomegaly. Diffuse airspace disease throughout the left lung. There is right base atelectasis. No visible effusions. IMPRESSION: Diffuse left lung airspace disease  concerning for pneumonia. Right base atelectasis. Electronically Signed   By: Rolm Baptise M.D.   On: 10/30/2016 07:47   Dg Chest Port 1 View  Result Date: 10/28/2016 CLINICAL DATA:  Status post removal of 2 left chest tubes yesterday. EXAM: PORTABLE CHEST 1 VIEW COMPARISON:  Portable chest x-ray of October 27, 2016 FINDINGS: The lungs are well-expanded. The interstitial markings remain increased but have improved bilaterally. There is no pneumothorax on the left. There is a small amount of residual pleural fluid layering posteriorly and laterally. One chest tube remains with its tip overlying the posterior medial aspect of the left seventh rib. There is no significant pleural effusion on the right. The cardiac silhouette remains enlarged. The central pulmonary vascularity is prominent. The left internal jugular venous catheter tip projects over the proximal SVC. IMPRESSION: No significant re-accumulation of a left pleural effusion or left-sided pneumothorax since removal of 2 of the chest tubes. The remaining left chest tube is in stable position. Stable bibasilar atelectasis with small or moderate amount of pleural fluid layering posteriorly and laterally on the left. Cardiomegaly with mild central pulmonary vascular prominence, stable.  Electronically Signed   By: David  Martinique M.D.   On: 10/28/2016 09:13   Dg Chest Port 1 View  Result Date: 10/27/2016 CLINICAL DATA:  Pleural and pericardial effusions. Left chest tubes. EXAM: PORTABLE CHEST 1 VIEW COMPARISON:  10/26/2016 and 10/25/2016 and chest CT dated 10/23/2016 FINDINGS: Two left-sided chest tubes remain in good position with no pneumothorax. Slight residual atelectasis in the left midzone with possibly some fluid along the fissure. Minimal atelectasis at the right lung base. Heart size and vascularity are normal. Central venous catheter tip is in the upper superior vena cava. The persistent enlargement of the cardiac silhouette consistent with the pericardial effusion present on 10/23/2016. IMPRESSION: No residual pneumothorax. Slight residual atelectasis and/or fluid in the left midzone. Electronically Signed   By: Lorriane Shire M.D.   On: 10/27/2016 08:42   Dg Chest Port 1 View  Result Date: 10/26/2016 CLINICAL DATA:  Left-sided pneumothorax with chest tube treatment EXAM: PORTABLE CHEST 1 VIEW COMPARISON:  Portable chest x-ray of October 25, 2016 FINDINGS: A tiny left apical pleural line is visible consistent with a less than 5% pneumothorax. The 2 chest tubes are in stable position. There is considerable interstitial and alveolar density in the left mid and lower lung with partial obscuration of the hemidiaphragm. There is minimal atelectasis or early interstitial infiltrate at the right lung base also. The cardiac silhouette where visualized appears enlarged. The pulmonary vascularity is not engorged. The mediastinum is normal in width. IMPRESSION: Less than 5% left apical pneumothorax persists. Considerable atelectasis or pneumonia in the left mid and lower lung persists. Mild atelectasis at the right lung base. No pleural effusion. Cardiomegaly without significant pulmonary vascular congestion. The 3 chest tubes are in stable position. Electronically Signed   By: David   Martinique M.D.   On: 10/26/2016 07:38   Dg Chest Port 1 View  Result Date: 10/25/2016 CLINICAL DATA:  Postoperative, evaluate for pneumothorax EXAM: PORTABLE CHEST 1 VIEW COMPARISON:  10/23/2016 chest radiograph. FINDINGS: Two left upper chest tubes are in place. Left internal jugular central venous catheter terminates in the upper third of the superior vena cava. Stable cardiomediastinal silhouette with normal heart size. No pneumothorax. No right pleural effusion. Persistent extensive patchy consolidation in the mid to lower left lung. IMPRESSION: 1. No pneumothorax.  Two left upper chest tubes are in place. 2. Persistent  extensive patchy consolidation in the mid to lower left lung, favor pneumonia and atelectasis, with a residual loculated left pleural effusion component or underlying lung mass not excluded. Electronically Signed   By: Ilona Sorrel M.D.   On: 10/25/2016 15:49       Subjective: Feeling well, denies worsening dyspnea.    Discharge Exam: Vitals:   11/04/16 0338 11/04/16 1317  BP: (!) 150/59 139/62  Pulse: 77 82  Resp: 20 18  Temp: 97.5 F (36.4 C) 98.5 F (36.9 C)   Vitals:   11/03/16 1350 11/03/16 2020 11/04/16 0338 11/04/16 1317  BP: (!) 136/51 (!) 148/60 (!) 150/59 139/62  Pulse: 86 81 77 82  Resp: 18 18 20 18   Temp: 98.2 F (36.8 C) 98.4 F (36.9 C) 97.5 F (36.4 C) 98.5 F (36.9 C)  TempSrc: Oral Oral Oral Oral  SpO2: 93% 97% 98% 98%  Weight:      Height:        General: Pt is alert, awake, not in acute distress Cardiovascular: RRR, S1/S2 +, no rubs, no gallops Respiratory: CTA bilaterally, no wheezing, no rhonchi Abdominal: Soft, NT, ND, bowel sounds + Extremities: no edema, no cyanosis    The results of significant diagnostics from this hospitalization (including imaging, microbiology, ancillary and laboratory) are listed below for reference.     Microbiology: Recent Results (from the past 240 hour(s))  C difficile quick scan w PCR reflex      Status: None   Collection Time: 11/02/16  4:15 PM  Result Value Ref Range Status   C Diff antigen NEGATIVE NEGATIVE Final   C Diff toxin NEGATIVE NEGATIVE Final   C Diff interpretation No C. difficile detected.  Final     Labs: BNP (last 3 results)  Recent Labs  10/23/16 1451  BNP [id number]   Basic Metabolic Panel:  Recent Labs Lab 10/30/16 0531 10/31/16 0751 11/01/16 0232 11/02/16 0332 11/03/16 0340 11/04/16 0203  NA 144 138 140 142 146* 143  K 4.1 3.7 3.5 4.4 5.0 4.1  CL 107 102 103 103 107 106  CO2 29 26 29 31  32 29  GLUCOSE 102* 115* 110* 98 109* 105*  BUN 9 8 8 6  5* 7  CREATININE 0.81 0.91 0.87 0.97 1.10* 1.13*  CALCIUM 8.1* 8.3* 8.0* 8.3* 8.7* 8.6*  MG 2.0 2.1 1.9 2.0 2.2  --   PHOS 4.4 3.3 2.7 3.4 4.6  --    Liver Function Tests:  Recent Labs Lab 10/30/16 0531 10/31/16 0751 11/01/16 0232 11/02/16 0332 11/03/16 0340  AST 40 39 30 34 26  ALT 27 28 24 21 19   ALKPHOS 111 154* 127* 122 118  BILITOT 0.3 0.4 0.3 0.9 0.4  PROT 4.9* 5.8* 5.4* 5.2* 5.2*  ALBUMIN 2.0* 2.4* 2.2* 2.1* 2.1*   No results for input(s): LIPASE, AMYLASE in the last 168 hours. No results for input(s): AMMONIA in the last 168 hours. CBC:  Recent Labs Lab 10/30/16 0531 10/31/16 0751 11/01/16 0232 11/02/16 0332 11/03/16 0340 11/04/16 1204  WBC 11.9* 13.8* 11.1* 11.4* 10.1 11.1*  NEUTROABS 8.3* 10.2* 8.2* 7.9* 7.1  --   HGB 10.0* 10.9* 9.4* 9.3* 9.1* 10.3*  HCT 32.3* 34.5* 31.4* 30.4* 30.3* 33.6*  MCV 89.2 88.2 89.2 89.9 90.4 89.4  PLT 374 491* 421* 437* 438* 514*   Cardiac Enzymes: No results for input(s): CKTOTAL, CKMB, CKMBINDEX, TROPONINI in the last 168 hours. BNP: Invalid input(s): POCBNP CBG:  Recent Labs Lab 10/30/16 1701 10/30/16 2228 10/31/16 [id number]  10/31/16 1126 11/04/16 0341  GLUCAP 90 112* 97 119* 94   D-Dimer No results for input(s): DDIMER in the last 72 hours. Hgb A1c No results for input(s): HGBA1C in the last 72 hours. Lipid Profile No results for  input(s): CHOL, HDL, LDLCALC, TRIG, CHOLHDL, LDLDIRECT in the last 72 hours. Thyroid function studies No results for input(s): TSH, T4TOTAL, T3FREE, THYROIDAB in the last 72 hours.  Invalid input(s): FREET3 Anemia work up No results for input(s): VITAMINB12, FOLATE, FERRITIN, TIBC, IRON, RETICCTPCT in the last 72 hours. Urinalysis    Component Value Date/Time   COLORURINE YELLOW 11/04/2016 1300   APPEARANCEUR CLEAR 11/04/2016 1300   LABSPEC 1.013 11/04/2016 1300   PHURINE 7.5 11/04/2016 1300   GLUCOSEU NEGATIVE 11/04/2016 1300   HGBUR NEGATIVE 11/04/2016 1300   BILIRUBINUR NEGATIVE 11/04/2016 1300   KETONESUR NEGATIVE 11/04/2016 1300   PROTEINUR NEGATIVE 11/04/2016 1300   UROBILINOGEN 0.2 09/22/2007 1157   NITRITE NEGATIVE 11/04/2016 1300   LEUKOCYTESUR TRACE (A) 11/04/2016 1300   Sepsis Labs Invalid input(s): PROCALCITONIN,  WBC,  LACTICIDVEN Microbiology Recent Results (from the past 240 hour(s))  C difficile quick scan w PCR reflex     Status: None   Collection Time: 11/02/16  4:15 PM  Result Value Ref Range Status   C Diff antigen NEGATIVE NEGATIVE Final   C Diff toxin NEGATIVE NEGATIVE Final   C Diff interpretation No C. difficile detected.  Final     Time coordinating discharge: Over 30 minutes  SIGNED:   Elmarie Shiley, MD  [facility] 11/05/2016, 2:28 PM Pager [phone]  If 7PM-7AM, please contact night-coverage [email or web address] Password [email or web address]

## 2016-11-16 ENCOUNTER — Other Ambulatory Visit: Payer: Self-pay | Admitting: Thoracic Surgery (Cardiothoracic Vascular Surgery)

## 2016-11-16 ENCOUNTER — Ambulatory Visit: Payer: Medicare Other | Admitting: Thoracic Surgery (Cardiothoracic Vascular Surgery)

## 2016-11-16 DIAGNOSIS — J90 Pleural effusion, not elsewhere classified: Secondary | ICD-10-CM

## 2016-11-16 DIAGNOSIS — K859 Acute pancreatitis without necrosis or infection, unspecified: Principal | ICD-10-CM

## 2016-11-16 DIAGNOSIS — J918 Pleural effusion in other conditions classified elsewhere: Secondary | ICD-10-CM

## 2016-11-17 ENCOUNTER — Ambulatory Visit
Admission: RE | Admit: 2016-11-17 | Discharge: 2016-11-17 | Disposition: A | Discharge status: Home / Self Care | Payer: Medicare Other | Source: Ambulatory Visit | Attending: Thoracic Surgery (Cardiothoracic Vascular Surgery) | Admitting: Thoracic Surgery (Cardiothoracic Vascular Surgery)

## 2016-11-17 ENCOUNTER — Encounter: Payer: Self-pay | Admitting: Thoracic Surgery (Cardiothoracic Vascular Surgery)

## 2016-11-17 ENCOUNTER — Ambulatory Visit (INDEPENDENT_AMBULATORY_CARE_PROVIDER_SITE_OTHER): Payer: Self-pay | Admitting: Thoracic Surgery (Cardiothoracic Vascular Surgery)

## 2016-11-17 VITALS — BP 122/69 | HR 94 | Resp 16 | Ht 65.5 in | Wt 191.6 lb

## 2016-11-17 DIAGNOSIS — Z09 Encounter for follow-up examination after completed treatment for conditions other than malignant neoplasm: Secondary | ICD-10-CM

## 2016-11-17 DIAGNOSIS — I313 Pericardial effusion (noninflammatory): Secondary | ICD-10-CM

## 2016-11-17 DIAGNOSIS — I3139 Other pericardial effusion (noninflammatory): Secondary | ICD-10-CM

## 2016-11-17 DIAGNOSIS — J90 Pleural effusion, not elsewhere classified: Secondary | ICD-10-CM

## 2016-11-17 MED ORDER — OXYCODONE HCL 5 MG PO TABS: 5.0000 mg | ORAL_TABLET | Freq: Two times a day (BID) | ORAL | 0 refills | Status: DC | PRN

## 2016-11-17 NOTE — Progress Notes (Signed)
[address]       [place] [address]             [phone]       HPI: Mrs. Fiacco returns for scheduled follow-up visit  She is a 70 year old woman with a history of CML who presented back in November with shortness of breath. She had a large, complicated left pleural effusion and pericardial effusion. She was treated empirically for pneumonia. She had a left VATS for drainage of the pleural effusion and pericardial effusion on 10/25/2016. She was discharged on 11/04/2016.  She still has some incisional discomfort. She is taking one oxycodone at night before she goes to bed. She has been having night sweats. She's not been having any fevers or chills. Her breathing is dramatically improved from preop.  Past Medical History:  Diagnosis Date  . Chronic myelogenous leukemia (CML), BCR-ABL1-positive ([place]) 11/20/2015  . Gastric ulcer 2011   EGD, 5/11  . Hypothyroidism    not on meds, followed by Dr. Elyse Hsu  . Leukocytosis 11/20/2015  . Lipoma    left upper arm  . PONV (postoperative nausea and vomiting)   . UTI (lower urinary tract infection)    frequent      Current Outpatient Prescriptions  Medication Sig Dispense Refill  . aspirin 325 MG tablet Take 1 tablet (325 mg total) by mouth daily. 30 tablet 0  . cetirizine (ZYRTEC) 10 MG tablet Take 10 mg by mouth daily.     . Cholecalciferol (VITAMIN D-3) 5000 UNITS TABS Take 5,000 Units by mouth daily.      Marland Kitchen conjugated estrogens (PREMARIN) vaginal cream Place 1 Applicatorful vaginally 2 (two) times a week. 1/2 to 1g    . escitalopram (LEXAPRO) 20 MG tablet TAKE 1 TABLET (20 MG TOTAL) BY MOUTH DAILY. 30 tablet 2  . ipratropium-albuterol (DUONEB) 0.5-2.5 (3) MG/3ML SOLN Take 3 mLs by nebulization 3 (three) times daily. 360 mL 0  . levothyroxine (SYNTHROID, LEVOTHROID) 25 MCG tablet Take 1 tablet (25 mcg total) by mouth daily before breakfast. 30 tablet 0  . Multiple Vitamins-Minerals (CENTRUM SILVER PO) Take  1 capsule by mouth daily.    Marland Kitchen oxyCODONE (OXY IR/ROXICODONE) 5 MG immediate release tablet Take 1 tablet (5 mg total) by mouth 2 (two) times daily as needed for moderate pain or severe pain. 20 tablet 0  . traMADol (ULTRAM) 50 MG tablet Take 1 tablet (50 mg total) by mouth every 6 (six) hours as needed. (Patient taking differently: Take 50 mg by mouth every 6 (six) hours as needed for moderate pain or severe pain. ) 90 tablet 0  . vitamin B-12 (CYANOCOBALAMIN) 1000 MCG tablet Take 5,000 mcg by mouth daily.      No current facility-administered medications for this visit.     Physical Exam BP 122/69 (BP Location: Right Arm, Patient Position: Sitting, Cuff Size: Large)   Pulse 94   Resp 16   Ht 5' 5.5" (1.664 m)   Wt 191 lb 9.6 oz (86.9 kg)   SpO2 96% Comment: ON RA  BMI 31.36 kg/m  70 year old woman in no acute distress Alert and oriented 3 with no focal deficits Incisions well healed Lungs clear with equal breath sounds bilaterally  Diagnostic Tests: CHEST  2 VIEW  COMPARISON:  11/03/2016  FINDINGS: There is mild right basilar atelectasis. There is a trace left pleural effusion. There is no pneumothorax. The heart and mediastinal contours are unremarkable. The osseous structures are unremarkable.  IMPRESSION: Trace left pleural effusion.  No pneumothorax.   Electronically Signed   By: Kathreen Devoid   On: 11/17/2016 14:58 I personally reviewed the chest x-ray currently the findings and above.  Impression: Mrs. Galinato is a 70 year old woman who had a left VATS for drainage. Pleural and pericardial effusions. Pathology showed chronic inflammation. This was likely parapneumonic. She was treated empirically for pneumonia.  She's doing well postoperatively. She has some incisional pain but is only taking oxycodone at night to help her sleep. I advised her to try taking either Tylenol or a nonsteroidal see if that would help her to sleep, but did give her another  prescription for oxycodone 5 mg 1 by mouth twice a day when necessary, 20 tablets, no refills just in case she needs those.  She may begin driving. Appropriate precautions were discussed. Her activities are otherwise unrestricted that she should build into new activities gradually.  She sees Dr. Whitney Muse tomorrow. She can start back on her CML treatment whenever Dr. Eda Paschal feels it is appropriate.  She did have some very mild adenopathy which was likely inflammatory as well as a 8 mm right lower lobe nodule noted on her CT. Him and have her come back in 2 months with a repeat CT just to make sure there is nothing to be concerned about there.  Plan:  Return in 2 months with CT chest  [name], MD [facility] [phone]

## 2016-11-18 ENCOUNTER — Encounter (HOSPITAL_COMMUNITY): Payer: Medicare Other | Attending: Hematology & Oncology | Admitting: Hematology & Oncology

## 2016-11-18 ENCOUNTER — Encounter (HOSPITAL_COMMUNITY): Payer: Medicare Other

## 2016-11-18 ENCOUNTER — Encounter (HOSPITAL_COMMUNITY): Payer: Self-pay | Admitting: Hematology & Oncology

## 2016-11-18 VITALS — BP 124/53 | HR 89 | Resp 18 | Ht 65.0 in | Wt 191.9 lb

## 2016-11-18 DIAGNOSIS — C9210 Chronic myeloid leukemia, BCR/ABL-positive, not having achieved remission: Secondary | ICD-10-CM

## 2016-11-18 DIAGNOSIS — J90 Pleural effusion, not elsewhere classified: Secondary | ICD-10-CM

## 2016-11-18 DIAGNOSIS — F418 Other specified anxiety disorders: Secondary | ICD-10-CM | POA: Diagnosis not present

## 2016-11-18 DIAGNOSIS — I313 Pericardial effusion (noninflammatory): Secondary | ICD-10-CM | POA: Diagnosis not present

## 2016-11-18 DIAGNOSIS — I3139 Other pericardial effusion (noninflammatory): Secondary | ICD-10-CM

## 2016-11-18 LAB — COMPREHENSIVE METABOLIC PANEL
ALBUMIN: 3.3 g/dL — AB (ref 3.5–5.0)
ALT: 23 U/L (ref 14–54)
AST: 39 U/L (ref 15–41)
Alkaline Phosphatase: 114 U/L (ref 38–126)
Anion gap: 9 (ref 5–15)
BILIRUBIN TOTAL: 0.5 mg/dL (ref 0.3–1.2)
BUN: 16 mg/dL (ref 6–20)
CHLORIDE: 100 mmol/L — AB (ref 101–111)
CO2: 28 mmol/L (ref 22–32)
CREATININE: 0.97 mg/dL (ref 0.44–1.00)
Calcium: 9.2 mg/dL (ref 8.9–10.3)
GFR calc Af Amer: >60 mL/min (ref >60)
GFR, EST NON AFRICAN AMERICAN: 57 mL/min — AB (ref >60)
GLUCOSE: 114 mg/dL — AB (ref 65–99)
POTASSIUM: 4.2 mmol/L (ref 3.5–5.1)
Sodium: 137 mmol/L (ref 135–145)
TOTAL PROTEIN: 7.4 g/dL (ref 6.5–8.1)

## 2016-11-18 LAB — CBC WITH DIFFERENTIAL/PLATELET
BASOS ABS: 0 10*3/uL (ref 0.0–0.1)
BASOS PCT: 0 %
Eosinophils Absolute: 0.2 10*3/uL (ref 0.0–0.7)
Eosinophils Relative: 2 %
HEMATOCRIT: 34.7 % — AB (ref 36.0–46.0)
Hemoglobin: 10.8 g/dL — ABNORMAL LOW (ref 12.0–15.0)
LYMPHS PCT: 14 %
Lymphs Abs: 1.3 10*3/uL (ref 0.7–4.0)
MCH: 27.6 pg (ref 26.0–34.0)
MCHC: 31.1 g/dL (ref 30.0–36.0)
MCV: 88.5 fL (ref 78.0–100.0)
MONO ABS: 0.9 10*3/uL (ref 0.1–1.0)
Monocytes Relative: 10 %
NEUTROS ABS: 7 10*3/uL (ref 1.7–7.7)
NEUTROS PCT: 74 %
Platelets: 390 10*3/uL (ref 150–400)
RBC: 3.92 MIL/uL (ref 3.87–5.11)
RDW: 14.2 % (ref 11.5–15.5)
WBC: 9.4 10*3/uL (ref 4.0–10.5)

## 2016-11-18 LAB — PHOSPHORUS: PHOSPHORUS: 3.8 mg/dL (ref 2.5–4.6)

## 2016-11-18 MED ORDER — ESCITALOPRAM OXALATE 20 MG PO TABS: 30.0000 mg | ORAL_TABLET | Freq: Every day | ORAL | 3 refills | Status: DC

## 2016-11-18 NOTE — Patient Instructions (Addendum)
[place] at [facility] Discharge Instructions  RECOMMENDATIONS MADE BY THE CONSULTANT AND ANY TEST RESULTS WILL BE SENT TO YOUR REFERRING PHYSICIAN.  You saw Dr.Penland today. She is going to refer you back to Dr. Florene Glen. Ensure and Boost samples given today. Rx given for Lexapro 20mg  take 1 1/2 tablets by mouth daily. Follow up one week after seeing Dr.Powell. See Amy at checkout for appointments.   Thank you for choosing [place] at [facility] to provide your oncology and hematology care.  To afford each patient quality time with our provider, please arrive at least 15 minutes before your scheduled appointment time.   Beginning January 23rd 2017 lab work for the [facility] will be done in the  Main lab at [facility] on 1st floor. If you have a lab appointment with the [place] please come in thru the  Main Entrance and check in at the main information desk  You need to re-schedule your appointment should you arrive 10 or more minutes late.  We strive to give you quality time with our providers, and arriving late affects you and other patients whose appointments are after yours.  Also, if you no show three or more times for appointments you may be dismissed from the clinic at the providers discretion.     Again, thank you for choosing [facility].  Our hope is that these requests will decrease the amount of time that you wait before being seen by our physicians.       _____________________________________________________________  Should you have questions after your visit to [facility], please contact our office at [phone] between the hours of 8:30 a.m. and 4:30 p.m.  Voicemails left after 4:30 p.m. will not be returned until the following business day.  For prescription refill requests, have your pharmacy contact our office.         Resources For Cancer Patients and their  Caregivers ? American Cancer Society: Can assist with transportation, wigs, general needs, runs Look Good Feel Better.        253-119-5234 ? Cancer Care: Provides financial assistance, online support groups, medication/co-pay assistance.  1-800-813-HOPE 514-304-9979) ? [place] Assists [place] cancer patients and their families through emotional , educational and financial support.  9057056814 ? [place] DSS Where to apply for food stamps, Medicaid and utility assistance. (260)271-9408 ? RCATS: Transportation to medical appointments. 215-663-8145 ? Social Security Administration: May apply for disability if have a Stage IV cancer. 5131039254 564-633-8831 ? [employer], Disability and Transit Services: Assists with nutrition, care and transit needs. [place] Support Programs: @10RELATIVEDAYS @ > Cancer Support Group  2nd Tuesday of the month 1pm-2pm, Journey Room  > Creative Journey  3rd Tuesday of the month 1130am-1pm, Journey Room  > Look Good Feel Better  1st Wednesday of the month 10am-12 noon, Journey Room (Call [place] to register [phone])

## 2016-11-18 NOTE — Progress Notes (Signed)
[place] at [facility] Hematology/Oncology Progress Note  Name: Andrea Martinez      MRN: [id number]   Date: 11/18/2016 Time:11:56 AM   REFERRING PHYSICIAN:  Sinda Du, MD   DIAGNOSIS:  CML   CML (chronic myeloid leukemia) ([place])   11/21/2015 Initial Diagnosis    Chronic myelogenous leukemia (CML), BCR-ABL1-positive ([place])      11/21/2015 Miscellaneous    Seen by Dr. Florene Glen [facility]      11/21/2015 -  Chemotherapy    Hydrea 1000 mg and Allopurinol 300 mg daily (due to high uric acid level)      11/21/2015 Tumor Marker    BCR P210 Ratio of Fusion to Control, Blood 0.8941    P210 IS % Ratio, Blood 85.834%        01/02/2016 - 10/23/2016 Chemotherapy    Tasigna      01/08/2016 Miscellaneous    EKG- QT/QTc: 382/440 ms      01/16/2016 Miscellaneous    EKG- QT/QTc: 364/419      03/30/2016 Tumor Marker    b2a2 transcript: 0.317 b3a2 transcript: 0.194      10/23/2016 - 11/05/2016 Hospital Admission    large Left Pleural Effusion and Pericardial Effusion that is now s/p VATS with Drainage, and Drainage of Pericardial Fluid with Window and Biopsy of Pericardium       HISTORY OF PRESENT ILLNESS:   Andrea Martinez is a 70 year old white American female here for additional f/u of CML.   Patient is accompanied by her husband. She reports poor appetite, fatigue, nerve pain, depression, and night sweats from pain medication. She is slowly feeling better since her hospitalization. She is currently holding her tasigna as advised. She has not restarted her medication. She feels somewhat frustrated because she was feeling so much better prior to her hospitalization. Her appetite is poor. This concerns her.   PAST MEDICAL HISTORY:   Past Medical History:  Diagnosis Date  . Chronic myelogenous leukemia (CML), BCR-ABL1-positive ([place]) 11/20/2015  . Gastric ulcer 2011   EGD, 5/11  . Hypothyroidism    not on meds, followed by Dr. Elyse Hsu  . Leukocytosis 11/20/2015   . Lipoma    left upper arm  . PONV (postoperative nausea and vomiting)   . UTI (lower urinary tract infection)    frequent    ALLERGIES: Allergies  Allergen Reactions  . Codeine Nausea Only    Headache  . Esomeprazole Magnesium Nausea Only  . Sulfonamide Derivatives Nausea Only    Headache  . Fluconazole Rash    Redness and blistering on left thigh      MEDICATIONS: I have reviewed the patient's current medications.    Current Outpatient Prescriptions on File Prior to Visit  Medication Sig Dispense Refill  . aspirin 325 MG tablet Take 1 tablet (325 mg total) by mouth daily. 30 tablet 0  . cetirizine (ZYRTEC) 10 MG tablet Take 10 mg by mouth daily.     . Cholecalciferol (VITAMIN D-3) 5000 UNITS TABS Take 5,000 Units by mouth daily.      Marland Kitchen conjugated estrogens (PREMARIN) vaginal cream Place 1 Applicatorful vaginally 2 (two) times a week. 1/2 to 1g    . escitalopram (LEXAPRO) 20 MG tablet TAKE 1 TABLET (20 MG TOTAL) BY MOUTH DAILY. 30 tablet 2  . ipratropium-albuterol (DUONEB) 0.5-2.5 (3) MG/3ML SOLN Take 3 mLs by nebulization 3 (three) times daily. 360 mL 0  . levothyroxine (SYNTHROID, LEVOTHROID) 25 MCG  tablet Take 1 tablet (25 mcg total) by mouth daily before breakfast. 30 tablet 0  . Multiple Vitamins-Minerals (CENTRUM SILVER PO) Take 1 capsule by mouth daily.    Marland Kitchen oxyCODONE (OXY IR/ROXICODONE) 5 MG immediate release tablet Take 1 tablet (5 mg total) by mouth 2 (two) times daily as needed for moderate pain or severe pain. 20 tablet 0  . vitamin B-12 (CYANOCOBALAMIN) 1000 MCG tablet Take 5,000 mcg by mouth daily.     . traMADol (ULTRAM) 50 MG tablet Take 1 tablet (50 mg total) by mouth every 6 (six) hours as needed. (Patient not taking: Reported on 11/18/2016) 90 tablet 0   No current facility-administered medications on file prior to visit.      PAST SURGICAL HISTORY Past Surgical History:  Procedure Laterality Date  . BRAVO [place] STUDY  12/04/2012   Procedure: BRAVO [place];  Surgeon: [name], MD;  [facility];  Service: Endoscopy;  Laterality: N/A;  . CHOLECYSTECTOMY N/A 01/03/2014   Procedure: LAPAROSCOPIC CHOLECYSTECTOMY WITH INTRAOPERATIVE CHOLANGIOGRAM;  Surgeon: Joyice Faster. Cornett, MD;  Location: [facility];  Service: General;  Laterality: N/A;  . COLONOSCOPY  01/2004   [facility], few small tics  . ESOPHAGOGASTRODUODENOSCOPY  02/2010   gastric ulcers  . ESOPHAGOGASTRODUODENOSCOPY  12/04/2012   BVQ:[id number] gastritis (inflammation) was found in the gastric antrum; multiple biopsies The duodenal mucosa showed no abnormalities in the bulb and second portion of the duodenum  . LIPOMA EXCISION  08/02/2011   left shoulder  . NOSE SURGERY    . PARTIAL HYSTERECTOMY     vaginal at age 37 years of age  . TOE DEBRIDEMENT  1962   lt great toe  . VIDEO ASSISTED THORACOSCOPY (VATS)/EMPYEMA Left 10/25/2016   Procedure: VIDEO ASSISTED THORACOSCOPY (VATS), BRONCH,DRAINAGE OF PLEURAL EFFUSION,PERICARDIAL WINDOW WITH DRAINAGE OF PERICARDIAL FLUID, TEE;  Surgeon: [name], MD;  Location: [facility];  Service: Thoracic;  Laterality: Left;  Marland Kitchen VIDEO BRONCHOSCOPY N/A 10/25/2016   Procedure: VIDEO BRONCHOSCOPY;  Surgeon: [name], MD;  Location: [facility];  Service: Thoracic;  Laterality: N/A;    FAMILY HISTORY: Family History  Problem Relation Age of [place]  . Lung cancer Father   . COPD Mother   . Colon cancer Neg Hx   . Anesthesia problems Neg Hx   . Hypotension Neg Hx   . Malignant hyperthermia Neg Hx   . Pseudochol deficiency Neg Hx     SOCIAL HISTORY:  reports that she has never smoked. She has never used smokeless tobacco. She reports that she does not drink alcohol or use drugs.  PERFORMANCE STATUS: The patient's performance status is 1 - Symptomatic but completely ambulatory  REVIEW OF SYSTEMS: Positive for malaise/fatigue. Positive for poor appetite Positive for depression Positive for nerve  pain Positive for night sweats 14 point review of systems was performed and is negative except as detailed under history of present illness and above  PHYSICAL EXAM: Most Recent Vital Signs: Blood pressure (!) 124/53, pulse 89, resp. rate 18, height '5\' 5"'  (1.651 m), weight 191 lb 14.4 oz (87 kg), SpO2 99 %.  Vitals with BMI 11/17/2016  Height 5' 5.5"  Weight 191 lbs 10 oz  BMI 82.8  Systolic 003  Diastolic 69  Pulse 94  Respirations 16   General appearance: alert, cooperative, appears stated age, fatigued, no distress, moderately obese , anxious, and accompanied by her husband Head: Normocephalic, without obvious abnormality, atraumatic Eyes: negative findings: lids and  lashes normal, conjunctivae and sclerae normal, corneas clear and pupils equal, round, reactive to light and accomodation Throat: lips, mucosa, and tongue normal; teeth and gums normal Neck: no adenopathy and supple, symmetrical, trachea midline Lungs: clear to auscultation bilaterally and normal percussion bilaterally Heart: regular rate and rhythm, S1, S2 normal, no murmur, click, rub or gallop Abdomen: normal findings: bowel sounds normal, liver span normal to percussion, no bruits heard, no masses palpable, no organomegaly, no renal abnormalities palpable, spleen non-palpable and symmetric and abnormal findings:  mild tenderness in the lower abdomen Extremities: Homans sign is negative, no sign of DVT and no edema, redness or tenderness in the calves or thighs, L thumb is mildly swollen, limited ROM secondary to pain, no erythema or warmth Skin: Skin color, texture, turgor normal.  Lymph nodes: Cervical, supraclavicular, and axillary nodes normal. Neurologic: Grossly normal   LABORATORY DATA:  I have reviewed the data as listed.   CBC    Component Value Date/Time   WBC 9.4 11/18/2016 1028   RBC 3.92 11/18/2016 1028   HGB 10.8 (L) 11/18/2016 1028   HCT 34.7 (L) 11/18/2016 1028   PLT 390 11/18/2016 1028   MCV  88.5 11/18/2016 1028   MCH 27.6 11/18/2016 1028   MCHC 31.1 11/18/2016 1028   RDW 14.2 11/18/2016 1028   LYMPHSABS 1.3 11/18/2016 1028   MONOABS 0.9 11/18/2016 1028   EOSABS 0.2 11/18/2016 1028   BASOSABS 0.0 11/18/2016 1028   CMP     Component Value Date/Time   NA 137 11/18/2016 1028   K 4.2 11/18/2016 1028   CL 100 (L) 11/18/2016 1028   CO2 28 11/18/2016 1028   GLUCOSE 114 (H) 11/18/2016 1028   BUN 16 11/18/2016 1028   CREATININE 0.97 11/18/2016 1028   CREATININE 0.87 07/14/2011 0938   CALCIUM 9.2 11/18/2016 1028   PROT 7.4 11/18/2016 1028   ALBUMIN 3.3 (L) 11/18/2016 1028   AST 39 11/18/2016 1028   ALT 23 11/18/2016 1028   ALKPHOS 114 11/18/2016 1028   BILITOT 0.5 11/18/2016 1028   GFRNONAA 57 (L) 11/18/2016 1028   GFRAA >60 11/18/2016 1028    PATHOLOGY:        ASSESSMENT/PLAN:  CML Anemia Tasigna 01/06/2016 Pleural and pericardial effusion   She is to continue to hold tasigna for now  I have discussed her recent hospitalization with Dr. Florene Glen. Tasigna is not known to cause pleural/pericaridal effusion. I have advised her I would like for her to go back to see Dr. Florene Glen in consultation before restarting Tasigna. She is agreeable.  She has not achieved a MMR at 8 months, b2a2 transcript is still at 0.3%, essentially stable. This was previously discussed with Dr. Florene Glen.   Depression/Adjustment Disorder  She is to continue on her lexapro. Continue with support group, hopefully she can return.   EKG change  She has seen cardiology.   Monitoring Response to TKI therapy  Bone marrow cytogenetics: at diagnosis, failure to reach response milestones and any sign of loss of response (defined as hematologic or cytogenetic relapse)  Quantitative RT-PCR using is preferred: at diagnosis, every 3 months after initiating treatment, after BCR-ABL1 0.1% - , 1% (IS) has been achieved, every 3 months for 2 years and every 3 - 6 months thereafter. If there is 1 log  increase in BCR-ABL1 transcript levels with MMR, QPCR should be repeated in 1 - 3 months  BCR-ABL kinase domain mutation analysis: Chronic phase: failure to reach response milestones, any sign of loss  of response (defined as hematologic or cytogenetic relapse) 1 - log increase in BCR-ABL1 transcript levels and loss of MMR. Disease progression to accelerated or blast phase  Criteria for Hematologic and molecular response   Complete Hematologic response Complete normalization of peripheral blood counts with leukocyte count ,10x103/L Platelet count < 450 x 109/L NO immature cells such as myelocytes, promyelocytes or blasts in peripheral blood No signs and symptoms of disease with disappearance of palpable splenomegaly  Molecular response Early molecular response  BCR-ABL1<= 10% (IS) at 3 and 6 months Major molecular response BCR-ABL1,0.1%  (IS) or > = 3 log reduction in BCR-ABL57mNA from the standardized baseline  Complete molecular response no detectable BCR-ABL182mA using a QPCR assay with a sensitivity of at least 4.5 logs below the standardized baseline   RTC post follow-up with Dr. PoFlorene Glen All questions were answered. The patient knows to call the clinic with any problems, questions or concerns. We can certainly see the patient much sooner if necessary.  This document serves as a record of services personally performed by ShAncil LinseyMD. It was created on her behalf by DaElmyra Ricksa trained medical scribe. The creation of this record is based on the scribe's personal observations and the provider's statements to them. This document has been checked and approved by the attending provider.  I have reviewed the above documentation for accuracy and completeness, and I agree with the above.  This note is electronically signed by:  PeMolli HazardMD  11/18/2016 11:56 AM

## 2016-12-03 ENCOUNTER — Telehealth (HOSPITAL_COMMUNITY): Payer: Self-pay | Admitting: Oncology

## 2016-12-03 NOTE — Telephone Encounter (Signed)
Spoke with Dr. Florene Glen today.  He saw Sundi today in the clinic.  He admits that it is very difficult to determine the next best route to move forward, but he notes an increasing WBC and would like to start some treatment for her CML.  We discussed options including restarting Tasigna or switching to another agent.  We have decided to restart Tasigna on 12/26 knowing that it has a VERY LOW risk for pericardial effusion with close follow-up.  We had a difficult time getting her Tasigna due to financial issues.  She has Tasigna at home already.    Dr. Florene Glen reports a decent hematologic response to therapy thus far, but a lack of good response from a molecular standpoint.  This is concerning as she has not met expected milestone related to treatment response.  He is going to send off for a Kinase mutation test to help guide whether she would respond better to another agent.  He notes that this take 3-4 weeks.  He encourages [place] to call him in 4 weeks if we have not heard back from him.  He will call us with results and recommendations when he receives the test results.  She has a scheduled appointment with [place] on 12/15/2016.  At that time, we will perform a chest xray to evaluate for pericardial effusion with ongoing close monitoring until we hear back from Dr. Florene Glen about recommendations moving forward.  Robynn Pane, PA-C 12/03/2016 12:30 PM

## 2016-12-08 ENCOUNTER — Other Ambulatory Visit: Payer: Self-pay | Admitting: Thoracic Surgery (Cardiothoracic Vascular Surgery)

## 2016-12-08 DIAGNOSIS — R911 Solitary pulmonary nodule: Secondary | ICD-10-CM

## 2016-12-10 ENCOUNTER — Telehealth (HOSPITAL_COMMUNITY): Payer: Self-pay | Admitting: Emergency Medicine

## 2016-12-10 NOTE — Telephone Encounter (Signed)
Completed paper work for [employer] patient assistance foundation.  Faxed and faxed confirmed.

## 2016-12-15 ENCOUNTER — Encounter (HOSPITAL_COMMUNITY): Payer: Self-pay | Admitting: Hematology & Oncology

## 2016-12-15 ENCOUNTER — Encounter (HOSPITAL_COMMUNITY): Payer: Medicare Other | Attending: Hematology & Oncology | Admitting: Hematology & Oncology

## 2016-12-15 ENCOUNTER — Ambulatory Visit (HOSPITAL_COMMUNITY)
Admission: RE | Admit: 2016-12-15 | Discharge: 2016-12-15 | Disposition: A | Discharge status: Home / Self Care | Payer: Medicare Other | Source: Ambulatory Visit | Attending: Hematology & Oncology | Admitting: Hematology & Oncology

## 2016-12-15 VITALS — BP 115/64 | HR 107 | Temp 98.4°F | Resp 20 | Wt 194.6 lb

## 2016-12-15 DIAGNOSIS — I313 Pericardial effusion (noninflammatory): Secondary | ICD-10-CM

## 2016-12-15 DIAGNOSIS — R918 Other nonspecific abnormal finding of lung field: Secondary | ICD-10-CM | POA: Diagnosis not present

## 2016-12-15 DIAGNOSIS — C9210 Chronic myeloid leukemia, BCR/ABL-positive, not having achieved remission: Secondary | ICD-10-CM | POA: Diagnosis not present

## 2016-12-15 DIAGNOSIS — F418 Other specified anxiety disorders: Secondary | ICD-10-CM | POA: Diagnosis not present

## 2016-12-15 DIAGNOSIS — I3139 Other pericardial effusion (noninflammatory): Secondary | ICD-10-CM

## 2016-12-15 DIAGNOSIS — J90 Pleural effusion, not elsewhere classified: Secondary | ICD-10-CM | POA: Diagnosis not present

## 2016-12-15 DIAGNOSIS — E039 Hypothyroidism, unspecified: Secondary | ICD-10-CM | POA: Diagnosis not present

## 2016-12-15 DIAGNOSIS — R05 Cough: Secondary | ICD-10-CM | POA: Diagnosis not present

## 2016-12-15 NOTE — Patient Instructions (Addendum)
[place] at [facility] Discharge Instructions  You were seen today by Dr. Whitney Muse. She would like for you to have a chest xray today. EKG next week. Return in 2 weeks for labs and follow up appointment.   RECOMMENDATIONS MADE BY THE CONSULTANT AND ANY TEST RESULTS WILL BE SENT TO YOUR REFERRING PHYSICIAN.  You were seen today by Dr. Whitney Muse  Thank you for choosing [place] at [facility] to provide your oncology and hematology care.  To afford each patient quality time with our provider, please arrive at least 15 minutes before your scheduled appointment time.    If you have a lab appointment with the [facility] please come in thru the  Main Entrance and check in at the main information desk  You need to re-schedule your appointment should you arrive 10 or more minutes late.  We strive to give you quality time with our providers, and arriving late affects you and other patients whose appointments are after yours.  Also, if you no show three or more times for appointments you may be dismissed from the clinic at the providers discretion.     Again, thank you for choosing [facility]  Our hope is that these requests will decrease the amount of time that you wait before being seen by our physicians.       _____________________________________________________________  Should you have questions after your visit to [facility], please contact our office at [phone] between the hours of 8:30 a.m. and 4:30 p.m.  Voicemails left after 4:30 p.m. will not be returned until the following business day.  For prescription refill requests, have your pharmacy contact our office.       Resources For Cancer Patients and their Caregivers ? American Cancer Society: Can assist with transportation, wigs, general needs, runs Look Good Feel Better.        629-333-3074 ? Cancer Care: Provides financial assistance, online  support groups, medication/co-pay assistance.  1-800-813-HOPE (774)060-0581) ? [place] Assists [place] cancer patients and their families through emotional , educational and financial support.  ([phone] ? [place] DSS Where to apply for food stamps, Medicaid and utility assistance. 315-134-9320 ? RCATS: Transportation to medical appointments. ([phone] ? Social Security Administration: May apply for disability if have a Stage IV cancer. 9791728271 641-536-5150 ? [employer], Disability and Transit Services: Assists with nutrition, care and transit needs. [place] Support Programs: @10RELATIVEDAYS @ > Cancer Support Group  2nd Tuesday of the month 1pm-2pm, Journey Room  > Creative Journey  3rd Tuesday of the month 1130am-1pm, Journey Room  > Look Good Feel Better  1st Wednesday of the month 10am-12 noon, Journey Room (Call [place] to register [phone])

## 2016-12-15 NOTE — Progress Notes (Signed)
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 Florene Glen [facility]      11/21/2015 -  Chemotherapy    Hydrea 1000 mg and Allopurinol 300 mg daily (due to high uric acid level)      11/21/2015 Tumor Marker    BCR P210 Ratio of Fusion to Control, Blood 0.8941    P210 IS % Ratio, Blood 85.834%        01/02/2016 -  Chemotherapy    Tasigna      01/08/2016 Miscellaneous    EKG- QT/QTc: 382/440 ms      01/16/2016 Miscellaneous    EKG- QT/QTc: 364/419      03/30/2016 Tumor Marker    b2a2 transcript: 0.317 b3a2 transcript: 0.194      10/23/2016 - 11/05/2016 Hospital Admission    large Left Pleural Effusion and Pericardial Effusion that is now s/p VATS with Drainage, and Drainage of Pericardial Fluid with Window and Biopsy of Pericardium       HISTORY OF PRESENT ILLNESS:   Andrea Martinez is a 71 year old white American female here for additional f/u of CML. She was seen in consultation with Dr. Florene Glen.   She was admitted from 10/23/2016 through 11/05/2016 with large Left Pleural Effusion and Pericardial Effusion that is now s/p VATS with Drainage, and Drainage of Pericardial Fluid with Window and Biopsy of Pericardium with Dr. Roxan Hockey. Tasigna was held during her hospitalization and through follow-up with me until she was seen by Dr. Florene Glen at [facility].   [name] is accompanied by her husband.   She started her Tasigna last week, per Dr. Abel Presto instructions. She denies SOB, CP.     She has been very tired. She has been getting all of her nutrients and has been walking on the treadmill, but she is still very tired. She does not believe  she needs PT.   She has a hard time sleeping and has been experiencing night sweats. She says she wakes up at 1:30 drenched in sweat, changes her clothes, then wakes up again at 4:30 because it happened again.   She has a loss of appetite.   She has not been needing her oxygen.   She had a virus last week. The cough from this still hasn't gone away, it is improving. No mucus is coming up. She has tried Mucinex and coughing pills, but that doesn't help. This cough started before she started the Tasigna again.   PAST MEDICAL HISTORY:   Past Medical History:  Diagnosis Date  . 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 Gastric ulcer 2011   EGD, 5/11  . Hypothyroidism    not on meds, followed by Dr. Elyse Hsu  . Leukocytosis 11/20/2015  . Lipoma    left upper arm  . PONV (postoperative nausea and vomiting)   . UTI (lower urinary tract infection)    frequent    ALLERGIES: Allergies  Allergen Reactions  . Codeine Nausea Only    Headache  . Esomeprazole Magnesium Nausea Only  . Sulfonamide Derivatives Nausea Only    Headache  .  Fluconazole Rash    Redness and blistering on left thigh      MEDICATIONS: I have reviewed the patient's current medications.    Current Outpatient Prescriptions on File Prior to Visit  Medication Sig Dispense Refill  . aspirin 325 MG tablet Take 1 tablet (325 mg total) by mouth daily. 30 tablet 0  . cetirizine (ZYRTEC) 10 MG tablet Take 10 mg by mouth daily.     . Cholecalciferol (VITAMIN D-3) 5000 UNITS TABS Take 5,000 Units by mouth daily.      Marland Kitchen conjugated estrogens (PREMARIN) vaginal cream Place 1 Applicatorful vaginally 2 (two) times a week. 1/2 to 1g    . escitalopram (LEXAPRO) 20 MG tablet TAKE 1 TABLET (20 MG TOTAL) BY MOUTH DAILY. 30 tablet 2  . escitalopram (LEXAPRO) 20 MG tablet Take 1.5 tablets (30 mg total) by mouth daily. 60 tablet 3  . ipratropium-albuterol (DUONEB) 0.5-2.5 (3) MG/3ML SOLN Take 3 mLs by  nebulization 3 (three) times daily. 360 mL 0  . Multiple Vitamins-Minerals (CENTRUM SILVER PO) Take 1 capsule by mouth daily.    . traMADol (ULTRAM) 50 MG tablet Take 1 tablet (50 mg total) by mouth every 6 (six) hours as needed. (Patient not taking: Reported on 11/18/2016) 90 tablet 0  . vitamin B-12 (CYANOCOBALAMIN) 1000 MCG tablet Take 5,000 mcg by mouth daily.      No current facility-administered medications on file prior to visit.      PAST SURGICAL HISTORY Past Surgical History:  Procedure Laterality Date  . BRAVO [place] STUDY  12/04/2012   Procedure: BRAVO [place];  Surgeon: [name], MD;  [facility];  Service: Endoscopy;  Laterality: N/A;  . CHOLECYSTECTOMY N/A 01/03/2014   Procedure: LAPAROSCOPIC CHOLECYSTECTOMY WITH INTRAOPERATIVE CHOLANGIOGRAM;  Surgeon: Joyice Faster. Cornett, MD;  Location: [place];  Service: General;  Laterality: N/A;  . COLONOSCOPY  01/2004   [facility], few small tics  . ESOPHAGOGASTRODUODENOSCOPY  02/2010   gastric ulcers  . ESOPHAGOGASTRODUODENOSCOPY  12/04/2012   XKG:YJEHUDJ gastritis (inflammation) was found in the gastric antrum; multiple biopsies The duodenal mucosa showed no abnormalities in the bulb and second portion of the duodenum  . LIPOMA EXCISION  08/02/2011   left shoulder  . NOSE SURGERY    . PARTIAL HYSTERECTOMY     vaginal at age 78 years of age  . TOE DEBRIDEMENT  1962   lt great toe  . VIDEO ASSISTED THORACOSCOPY (VATS)/EMPYEMA Left 10/25/2016   Procedure: VIDEO ASSISTED THORACOSCOPY (VATS), BRONCH,DRAINAGE OF PLEURAL EFFUSION,PERICARDIAL WINDOW WITH DRAINAGE OF PERICARDIAL FLUID, TEE;  Surgeon: [name], MD;  Location: [place];  Service: Thoracic;  Laterality: Left;  Marland Kitchen VIDEO BRONCHOSCOPY N/A 10/25/2016   Procedure: VIDEO BRONCHOSCOPY;  Surgeon: [name], MD;  Location: [place];  Service: Thoracic;  Laterality: N/A;    FAMILY HISTORY: Family History  Problem Relation Age of Onset  .  Lung cancer Father   . COPD Mother   . Colon cancer Neg Hx   . Anesthesia problems Neg Hx   . Hypotension Neg Hx   . Malignant hyperthermia Neg Hx   . Pseudochol deficiency Neg Hx     SOCIAL HISTORY:  reports that she has never smoked. She has never used smokeless tobacco. She reports that she does not drink alcohol or use drugs.  PERFORMANCE STATUS: The patient's performance status is 1 - Symptomatic but completely ambulatory  REVIEW OF SYSTEMS: Review of Systems  Constitutional: Positive for malaise/fatigue.  Pos night sweats and hot flashes.  Pos loss of appetite.   HENT: Negative.   Eyes: Negative.   Respiratory: Positive for cough. Negative for sputum production.   Cardiovascular: Negative.   Gastrointestinal: Negative.   Genitourinary: Negative.   Musculoskeletal: Negative.  Negative for joint pain.  Skin: Negative.   Neurological: Negative.   Endo/Heme/Allergies: Negative.   Psychiatric/Behavioral: The patient has insomnia.   All other systems reviewed and are negative.  14 point review of systems was performed and is negative except as detailed under history of present illness and above  PHYSICAL EXAM: Most Recent Vital Signs: Blood pressure 115/64, pulse (!) 107, temperature 98.4 F (36.9 C), temperature source Oral, resp. rate 20, weight 194 lb 9.6 oz (88.3 kg), SpO2 98 %.   Physical Exam  Constitutional: She is oriented to person, place, and time and well-developed, well-nourished, and in no distress.  Pt is able to get on exam table without assistance.   HENT:  Head: Normocephalic and atraumatic.  Mouth/Throat: No oropharyngeal exudate.  Eyes: EOM are normal. Pupils are equal, round, and reactive to light. No scleral icterus.  Neck: Normal range of motion. Neck supple.  Cardiovascular: Normal rate, regular rhythm and normal heart sounds.   Pulmonary/Chest: Effort normal and breath sounds normal. No respiratory distress. She has no wheezes. She has no  rales. She exhibits no tenderness.  Abdominal: Soft. Bowel sounds are normal. She exhibits no distension. There is no tenderness.  Musculoskeletal: Normal range of motion. She exhibits no edema.  Lymphadenopathy:    She has no cervical adenopathy.  Neurological: She is alert and oriented to person, place, and time. Gait normal.  Skin: Skin is warm and dry.  Psychiatric: Mood, memory, affect and judgment normal.  Nursing note and vitals reviewed.   LABORATORY DATA:  I have reviewed the data as listed.   CBC    Component Value Date/Time   WBC 9.4 11/18/2016 1028   RBC 3.92 11/18/2016 1028   HGB 10.8 (L) 11/18/2016 1028   HCT 34.7 (L) 11/18/2016 1028   PLT 390 11/18/2016 1028   MCV 88.5 11/18/2016 1028   MCH 27.6 11/18/2016 1028   MCHC 31.1 11/18/2016 1028   RDW 14.2 11/18/2016 1028   LYMPHSABS 1.3 11/18/2016 1028   MONOABS 0.9 11/18/2016 1028   EOSABS 0.2 11/18/2016 1028   BASOSABS 0.0 11/18/2016 1028   CMP     Component Value Date/Time   NA 137 11/18/2016 1028   K 4.2 11/18/2016 1028   CL 100 (L) 11/18/2016 1028   CO2 28 11/18/2016 1028   GLUCOSE 114 (H) 11/18/2016 1028   BUN 16 11/18/2016 1028   CREATININE 0.97 11/18/2016 1028   CREATININE 0.87 07/14/2011 0938   CALCIUM 9.2 11/18/2016 1028   PROT 7.4 11/18/2016 1028   ALBUMIN 3.3 (L) 11/18/2016 1028   AST 39 11/18/2016 1028   ALT 23 11/18/2016 1028   ALKPHOS 114 11/18/2016 1028   BILITOT 0.5 11/18/2016 1028   GFRNONAA 57 (L) 11/18/2016 1028   GFRAA >60 11/18/2016 1028   RADIOGRAPHIC STUDIES: I have personally reviewed the below radiographic studies and agree with their findings.   Study Result   CLINICAL DATA:  VATS November 2017. Pleural effusion. Shortness of breath.  EXAM: CHEST  2 VIEW  COMPARISON:  11/03/2016  FINDINGS: There is mild right basilar atelectasis. There is a trace left pleural effusion. There is no pneumothorax. The heart and mediastinal contours are unremarkable. The osseous  structures are unremarkable.  IMPRESSION: Trace left pleural effusion.  No pneumothorax.   Electronically Signed   By: Kathreen Devoid   On: 11/17/2016 14:58    PATHOLOGY:        ASSESSMENT/PLAN:  CML Anemia Tasigna 01/06/2016 Pericardial effusion Pleural effusion   She is to continue on tasigna as prescribed. She had not achieved a MMR at 8 months, b2a2 transcript is still at 0.3%, essentially stable. This had been discussed with Dr. Florene Glen. We did not get to recheck because of her recent complicated hospital stay. She has just restarted [place]. She was seen by Dr. Florene Glen on 12/22. Will order CXR today. Will return to clinic in 1 to 2 weeks with ongoing follow-up.     Depression/Adjustment Disorder  She is to continue on her lexapro.   EKG change  She has seen cardiology  Monitoring Response to TKI therapy  Bone marrow cytogenetics: at diagnosis, failure to reach response milestones and any sign of loss of response (defined as hematologic or cytogenetic relapse)  Quantitative RT-PCR using is preferred: at diagnosis, every 3 months after initiating treatment, after BCR-ABL1 0.1% - , 1% (IS) has been achieved, every 3 months for 2 years and every 3 - 6 months thereafter. If there is 1 log increase in BCR-ABL1 transcript levels with MMR, QPCR should be repeated in 1 - 3 months  BCR-ABL kinase domain mutation analysis: Chronic phase: failure to reach response milestones, any sign of loss of response (defined as hematologic or cytogenetic relapse) 1 - log increase in BCR-ABL1 transcript levels and loss of MMR. Disease progression to accelerated or blast phase  Criteria for Hematologic and molecular response   Complete Hematologic response Complete normalization of peripheral blood counts with leukocyte count ,10x103/L Platelet count < 450 x 109/L NO immature cells such as myelocytes, promyelocytes or blasts in peripheral blood No signs and symptoms of disease with  disappearance of palpable splenomegaly  Molecular response Early molecular response  BCR-ABL1<= 10% (IS) at 3 and 6 months Major molecular response BCR-ABL1,0.1%  (IS) or > = 3 log reduction in BCR-ABL37mNA from the standardized baseline  Complete molecular response no detectable BCR-ABL154mA using a QPCR assay with a sensitivity of at least 4.5 logs below the standardized baseline  I have encouraged her to slowly increase her activity level to help the fatigue.   She will return for a follow up in 1 week.   Orders Placed This Encounter  Procedures  . DG Chest 2 View    JH/ESIGNED    Standing Status:   Future    Number of Occurrences:   1    Standing Expiration Date:   12/15/2017    Order Specific Question:   Reason for Exam (SYMPTOM  OR DIAGNOSIS REQUIRED)    Answer:   cough, history pleural pericardial effusion    Order Specific Question:   [facility]    [facility] questions were answered. The patient knows to call the clinic with any problems, questions or concerns. We can certainly see the patient much sooner if necessary.  This document serves as a record of services personally performed by ShAncil LinseyMD. It was created on her behalf by JoMartiniqueasey, a trained medical scribe. The creation of this record is based on the scribe's personal observations and the provider's statements to them. This document has been checked and approved by the attending provider.  I have reviewed the above documentation for accuracy and completeness, and I  agree with the above.  This note is electronically signed by:  Molli Hazard, MD  12/16/2016 6:15 PM

## 2016-12-16 ENCOUNTER — Encounter (HOSPITAL_COMMUNITY): Payer: Self-pay | Admitting: Hematology & Oncology

## 2016-12-22 ENCOUNTER — Other Ambulatory Visit (HOSPITAL_COMMUNITY): Payer: Medicare Other

## 2016-12-23 ENCOUNTER — Ambulatory Visit (HOSPITAL_COMMUNITY)
Admission: RE | Admit: 2016-12-23 | Discharge: 2016-12-23 | Disposition: A | Discharge status: Home / Self Care | Payer: Medicare Other | Source: Ambulatory Visit | Attending: Hematology & Oncology | Admitting: Hematology & Oncology

## 2016-12-23 ENCOUNTER — Other Ambulatory Visit (HOSPITAL_COMMUNITY): Payer: Medicare Other

## 2016-12-23 ENCOUNTER — Other Ambulatory Visit: Payer: Self-pay

## 2016-12-23 DIAGNOSIS — R9431 Abnormal electrocardiogram [ECG] [EKG]: Secondary | ICD-10-CM | POA: Diagnosis not present

## 2016-12-23 DIAGNOSIS — Z09 Encounter for follow-up examination after completed treatment for conditions other than malignant neoplasm: Secondary | ICD-10-CM | POA: Insufficient documentation

## 2016-12-27 DIAGNOSIS — J189 Pneumonia, unspecified organism: Secondary | ICD-10-CM | POA: Diagnosis not present

## 2016-12-27 DIAGNOSIS — C9220 Atypical chronic myeloid leukemia, BCR/ABL-negative, not having achieved remission: Secondary | ICD-10-CM | POA: Diagnosis not present

## 2016-12-27 DIAGNOSIS — K219 Gastro-esophageal reflux disease without esophagitis: Secondary | ICD-10-CM | POA: Diagnosis not present

## 2016-12-31 ENCOUNTER — Encounter (HOSPITAL_COMMUNITY): Payer: Self-pay | Admitting: Oncology

## 2016-12-31 ENCOUNTER — Encounter (HOSPITAL_BASED_OUTPATIENT_CLINIC_OR_DEPARTMENT_OTHER): Payer: Medicare Other | Admitting: Oncology

## 2016-12-31 ENCOUNTER — Encounter (HOSPITAL_COMMUNITY): Payer: Medicare Other

## 2016-12-31 ENCOUNTER — Ambulatory Visit (HOSPITAL_COMMUNITY)
Admission: RE | Admit: 2016-12-31 | Discharge: 2016-12-31 | Disposition: A | Discharge status: Home / Self Care | Payer: Medicare Other | Source: Ambulatory Visit | Attending: Hematology & Oncology | Admitting: Hematology & Oncology

## 2016-12-31 VITALS — BP 146/70 | HR 103 | Temp 98.5°F | Resp 18 | Wt 187.6 lb

## 2016-12-31 DIAGNOSIS — C9210 Chronic myeloid leukemia, BCR/ABL-positive, not having achieved remission: Secondary | ICD-10-CM

## 2016-12-31 DIAGNOSIS — I517 Cardiomegaly: Secondary | ICD-10-CM | POA: Diagnosis not present

## 2016-12-31 DIAGNOSIS — I313 Pericardial effusion (noninflammatory): Secondary | ICD-10-CM | POA: Diagnosis not present

## 2016-12-31 DIAGNOSIS — J90 Pleural effusion, not elsewhere classified: Secondary | ICD-10-CM | POA: Diagnosis not present

## 2016-12-31 LAB — CBC WITH DIFFERENTIAL/PLATELET
BASOS ABS: 0 10*3/uL (ref 0.0–0.1)
BASOS PCT: 0 %
EOS PCT: 1 %
Eosinophils Absolute: 0.2 10*3/uL (ref 0.0–0.7)
HEMATOCRIT: 30.5 % — AB (ref 36.0–46.0)
Hemoglobin: 9.6 g/dL — ABNORMAL LOW (ref 12.0–15.0)
LYMPHS PCT: 14 %
Lymphs Abs: 1.6 10*3/uL (ref 0.7–4.0)
MCH: 26.6 pg (ref 26.0–34.0)
MCHC: 31.5 g/dL (ref 30.0–36.0)
MCV: 84.5 fL (ref 78.0–100.0)
MONO ABS: 0.7 10*3/uL (ref 0.1–1.0)
Monocytes Relative: 6 %
NEUTROS ABS: 8.8 10*3/uL — AB (ref 1.7–7.7)
Neutrophils Relative %: 79 %
PLATELETS: 532 10*3/uL — AB (ref 150–400)
RBC: 3.61 MIL/uL — AB (ref 3.87–5.11)
RDW: 15.2 % (ref 11.5–15.5)
WBC: 11.3 10*3/uL — AB (ref 4.0–10.5)

## 2016-12-31 LAB — COMPREHENSIVE METABOLIC PANEL
ALBUMIN: 3.2 g/dL — AB (ref 3.5–5.0)
ALT: 23 U/L (ref 14–54)
AST: 37 U/L (ref 15–41)
Alkaline Phosphatase: 171 U/L — ABNORMAL HIGH (ref 38–126)
Anion gap: 10 (ref 5–15)
BUN: 20 mg/dL (ref 6–20)
CHLORIDE: 101 mmol/L (ref 101–111)
CO2: 26 mmol/L (ref 22–32)
CREATININE: 0.77 mg/dL (ref 0.44–1.00)
Calcium: 9 mg/dL (ref 8.9–10.3)
GFR calc Af Amer: >60 mL/min (ref >60)
GFR calc non Af Amer: >60 mL/min (ref >60)
GLUCOSE: 113 mg/dL — AB (ref 65–99)
POTASSIUM: 4.4 mmol/L (ref 3.5–5.1)
Sodium: 137 mmol/L (ref 135–145)
Total Bilirubin: 0.6 mg/dL (ref 0.3–1.2)
Total Protein: 8 g/dL (ref 6.5–8.1)

## 2016-12-31 LAB — MAGNESIUM: MAGNESIUM: 2.1 mg/dL (ref 1.7–2.4)

## 2016-12-31 LAB — TSH: TSH: 8.119 u[IU]/mL — AB (ref 0.350–4.500)

## 2016-12-31 LAB — PHOSPHORUS: PHOSPHORUS: 3.5 mg/dL (ref 2.5–4.6)

## 2016-12-31 NOTE — Assessment & Plan Note (Addendum)
CML, on Tasigna beginning on [id number], complicated by pericardial effusion in November 2017 requiring pericardial window.  Tasigna restarted on 12/06/2016.  Labs today: CBC diff, CMET, magnesium, phosphorus.  I personally reviewed and went over laboratory results with the patient.  The results are noted within this dictation.  Labs demonstrate a mild leukocytosis with worsening anemia, and thrombocytosis.  She asks if TSH can be added to labs today.  I will order and see.  Given her lab changes, anemia panel is added to her next lab check.  Labs in 2 weeks: CBC diff, CMET, magnesium, phosphorus, and anemia panel.  She reports night sweats, between 2-4 AM.  She reports rare day time night sweats.  She denies any fevers.    Chest xray today.  Order is placed.  We are waiting to hear back about co-pay assistance regarding her Tasigna.  All paper work has been submitted.  We expect to hear something with the next 1-2 weeks, but [employer] reports responses up to March 2018.  She will follow-up with Dr. Roxan Hockey as scheduled on 01/18/17 with CT imaging.  Return in 2 weeks for follow-up with labs and EKG.  We expect to hear back from Dr. Florene Glen any day now.  If not by next week, then we will call him for an update on PCR and ABL Kinase Domain Mutation.

## 2016-12-31 NOTE — Patient Instructions (Addendum)
[place] at [facility] Discharge Instructions  RECOMMENDATIONS MADE BY THE CONSULTANT AND ANY TEST RESULTS WILL BE SENT TO YOUR REFERRING PHYSICIAN.  You were seen today by Kirby Crigler PA-C. Continue Tasigna.  Chest xray today. Return in 2 weeks for follow up, labs and EKG.   Thank you for choosing [place] at [facility] to provide your oncology and hematology care.  To afford each patient quality time with our provider, please arrive at least 15 minutes before your scheduled appointment time.    If you have a lab appointment with the [facility] please come in thru the  Main Entrance and check in at the main information desk  You need to re-schedule your appointment should you arrive 10 or more minutes late.  We strive to give you quality time with our providers, and arriving late affects you and other patients whose appointments are after yours.  Also, if you no show three or more times for appointments you may be dismissed from the clinic at the providers discretion.     Again, thank you for choosing [facility].  Our hope is that these requests will decrease the amount of time that you wait before being seen by our physicians.       _____________________________________________________________  Should you have questions after your visit to [facility], please contact our office at [phone] between the hours of 8:30 a.m. and 4:30 p.m.  Voicemails left after 4:30 p.m. will not be returned until the following business day.  For prescription refill requests, have your pharmacy contact our office.       Resources For Cancer Patients and their Caregivers ? American Cancer Society: Can assist with transportation, wigs, general needs, runs Look Good Feel Better.        740 783 6249 ? Cancer Care: Provides financial assistance, online support groups, medication/co-pay assistance.  1-800-813-HOPE  445 720 6009) ? [employer] Assists [place] cancer patients and their families through emotional , educational and financial support.  432-454-1501 ? [place] DSS Where to apply for food stamps, Medicaid and utility assistance. 225-770-7697 ? [employer]: Transportation to medical appointments. (847)588-7684 ? Social Security Administration: May apply for disability if have a Stage IV cancer. 417-345-0316 760-068-3836 ? [employer], Disability and Transit Services: Assists with nutrition, care and transit needs. [place] Support Programs: @10RELATIVEDAYS @ > Cancer Support Group  2nd Tuesday of the month 1pm-2pm, Journey Room  > Creative Journey  3rd Tuesday of the month 1130am-1pm, Journey Room  > Look Good Feel Better  1st Wednesday of the month 10am-12 noon, Journey Room (Call [employer] to register [phone])

## 2016-12-31 NOTE — Progress Notes (Signed)
Andrea Bogus, MD Andrea Martinez [place] [id number]  CML (chronic myeloid leukemia) ([place]) - Plan: cephALEXin (KEFLEX) 500 MG capsule, CBC with Differential, Comprehensive metabolic panel, Magnesium, Phosphorus, DG Chest 2 View, TSH, TSH, Vitamin B12, Folate, Iron and TIBC, Ferritin  CURRENT THERAPY: Tasigna  INTERVAL HISTORY: Andrea Martinez 71 y.o. female returns for followup of CML, on Tasigna beginning on [id number], complicated by pericardial effusion in November 2017 requiring pericardial window.  Tasigna restarted on 12/06/2016.    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 Florene Glen [facility]      11/21/2015 -  Chemotherapy    Hydrea 1000 mg and Allopurinol 300 mg daily (due to high uric acid level)      11/21/2015 Tumor Marker    BCR P210 Ratio of Fusion to Control, Blood 0.8941    P210 IS % Ratio, Blood 85.834%        01/02/2016 - 10/23/2016 Chemotherapy    Tasigna      01/08/2016 Miscellaneous    EKG- QT/QTc: 382/440 ms      01/16/2016 Miscellaneous    EKG- QT/QTc: 364/419      03/30/2016 Tumor Marker    b2a2 transcript: 0.317 b3a2 transcript: 0.194      10/23/2016 - 11/05/2016 Hospital Admission    large Left Pleural Effusion and Pericardial Effusion that is now s/p VATS with Drainage, and Drainage of Pericardial Fluid with Window and Biopsy of Pericardium      12/06/2016 -  Chemotherapy    Tasigna restarted       She reports ongoing night sweats.  She reports that they occur between 2-4 AM.  She notes that they are drenching night sweats.  She also reports fatigue and tiredness.  She is concerned about the influenza and therefore, she remains mainly in her house.     Her weight is down 7 lbs since Jan 3.  She denies any appetite changes.  Due to her night sweats, she admits to poor sleeping.  She  continues with a cough that is productive of clear sputum only.  She recently was prescribed an antibiotic by her primary care provider.  She reports that it is improved slightly.  She denies any fevers or chills.  She was also given a cough suppressant by her primary care provider.  Review of Systems  Constitutional: Positive for malaise/fatigue and weight loss. Negative for chills and fever.  HENT: Negative.   Eyes: Negative.   Respiratory: Positive for cough.   Cardiovascular: Negative.  Negative for chest pain.  Gastrointestinal: Negative.  Negative for abdominal pain, constipation, diarrhea, nausea and vomiting.  Genitourinary: Negative.  Negative for dysuria.  Musculoskeletal: Negative.   Skin: Negative.  Negative for rash.  Neurological: Negative.   Endo/Heme/Allergies: Negative.   Psychiatric/Behavioral: Negative.     Past Medical History:  Diagnosis Date  . 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 Gastric ulcer 2011   EGD, 5/11  . Hypothyroidism    not on meds, followed by Dr. Elyse Hsu  . Leukocytosis 11/20/2015  . Lipoma    left upper arm  . PONV (postoperative nausea and vomiting)   . UTI (lower urinary tract infection)    frequent    Past Surgical History:  Procedure Laterality Date  . BRAVO [place] STUDY  12/04/2012  Procedure: BRAVO [place];  Surgeon: [name], MD;  [facility];  Service: Endoscopy;  Laterality: N/A;  . CHOLECYSTECTOMY N/A 01/03/2014   Procedure: LAPAROSCOPIC CHOLECYSTECTOMY WITH INTRAOPERATIVE CHOLANGIOGRAM;  Surgeon: Joyice Faster. Cornett, MD;  Location: [place] [facility];  Service: General;  Laterality: N/A;  . COLONOSCOPY  01/2004   DR [facility], few small tics  . ESOPHAGOGASTRODUODENOSCOPY  02/2010   gastric ulcers  . ESOPHAGOGASTRODUODENOSCOPY  12/04/2012   [id number] gastritis (inflammation) was found in the gastric antrum; multiple biopsies The duodenal mucosa showed no abnormalities in the  bulb and second portion of the duodenum  . LIPOMA EXCISION  08/02/2011   left shoulder  . NOSE SURGERY    . PARTIAL HYSTERECTOMY     vaginal at age 12 years of age  . TOE DEBRIDEMENT  1962   lt great toe  . VIDEO ASSISTED THORACOSCOPY (VATS)/EMPYEMA Left 10/25/2016   Procedure: VIDEO ASSISTED THORACOSCOPY (VATS), BRONCH,DRAINAGE OF PLEURAL EFFUSION,PERICARDIAL WINDOW WITH DRAINAGE OF PERICARDIAL FLUID, TEE;  Surgeon: [name], MD;  Location: [facility];  Service: Thoracic;  Laterality: Left;  Marland Kitchen VIDEO BRONCHOSCOPY N/A 10/25/2016   Procedure: VIDEO BRONCHOSCOPY;  Surgeon: [name], MD;  Location: [facility] OR;  Service: Thoracic;  Laterality: N/A;    Family History  Problem Relation Age of Onset  . Lung cancer Father   . COPD Mother   . Colon cancer Neg Hx   . Anesthesia problems Neg Hx   . Hypotension Neg Hx   . Malignant hyperthermia Neg Hx   . Pseudochol deficiency Neg Hx     Social History   Social History  . Marital status: Married    Spouse name: N/A  . Number of children: 2  . Years of education: 12th grade   Occupational History  . retired [employer]  .  Retired   Social History Main Topics  . Smoking status: Never Smoker  . Smokeless tobacco: Never Used  . Alcohol use No  . Drug use: No  . Sexual activity: Yes    Partners: Male    Birth control/ protection: None   Other Topics Concern  . None   Social History Narrative  . None     PHYSICAL EXAMINATION  ECOG PERFORMANCE STATUS: 1 - Symptomatic but completely ambulatory  Vitals:   12/31/16 1413  BP: (!) 146/70  Pulse: (!) 103  Resp: 18  Temp: 98.5 F (36.9 C)    GENERAL:alert, no distress, well nourished, well developed, anxious, comfortable, cooperative, obese and accompanied by her husband SKIN: skin color, texture, turgor are normal, no rashes or significant lesions HEAD: Normocephalic, No masses, lesions, tenderness or abnormalities EYES: normal, EOMI, Conjunctiva are pink and  non-injected EARS: External ears normal OROPHARYNX:lips, buccal mucosa, and tongue normal and mucous membranes are moist  NECK: supple, trachea midline LYMPH:  not examined BREAST:not examined LUNGS: clear to auscultation  HEART: regular rate & rhythm ABDOMEN:abdomen soft and normal bowel sounds BACK: Back symmetric, no curvature. EXTREMITIES:less then 2 second capillary refill, no joint deformities, effusion, or inflammation, no skin discoloration, no cyanosis  NEURO: alert & oriented x 3 with fluent speech, no focal motor/sensory deficits, gait normal   LABORATORY DATA: CBC    Component Value Date/Time   WBC 11.3 (H) 12/31/2016 1314   RBC 3.61 (L) 12/31/2016 1314   HGB 9.6 (L) 12/31/2016 1314   HCT 30.5 (L) 12/31/2016 1314   PLT 532 (H) 12/31/2016 1314   MCV 84.5 12/31/2016 1314  MCH 26.6 12/31/2016 1314   MCHC 31.5 12/31/2016 1314   RDW 15.2 12/31/2016 1314   LYMPHSABS 1.6 12/31/2016 1314   MONOABS 0.7 12/31/2016 1314   EOSABS 0.2 12/31/2016 1314   BASOSABS 0.0 12/31/2016 1314      Chemistry      Component Value Date/Time   NA 137 12/31/2016 1314   K 4.4 12/31/2016 1314   CL 101 12/31/2016 1314   CO2 26 12/31/2016 1314   BUN 20 12/31/2016 1314   CREATININE 0.77 12/31/2016 1314   CREATININE 0.87 07/14/2011 0938      Component Value Date/Time   CALCIUM 9.0 12/31/2016 1314   ALKPHOS 171 (H) 12/31/2016 1314   AST 37 12/31/2016 1314   ALT 23 12/31/2016 1314   BILITOT 0.6 12/31/2016 1314        PENDING LABS:   RADIOGRAPHIC STUDIES:  Dg Chest 2 View  Result Date: 12/15/2016 CLINICAL DATA:  71 year old female with cough. Recent pleural and pericardial effusion. Status post VATS in November. Chronic myelogenous leukemia Subsequent encounter. EXAM: CHEST  2 VIEW COMPARISON:  11/17/2016 chest radiographs and earlier. CT chest 10/23/2016. FINDINGS: Small residual left pleural effusion and/or pleural thickening appear stable since 11/17/2016. There is chronic  streaky opacity at the right lung base which has not significantly changed. The upper lobes remain clear. No pneumothorax or pulmonary edema. Stable borderline to mild cardiomegaly. Other mediastinal contours are within normal limits. Visualized tracheal air column is within normal limits. No acute osseous abnormality identified. Negative visible bowel gas pattern. Stable cholecystectomy clips. IMPRESSION: 1.  No acute cardiopulmonary abnormality. 2. Stable left lung base with small residual pleural effusion and/or pleural thickening. 3. Chronic streaky opacity at the right lung base has not significantly changed, favor scarring. Electronically Signed   By: Genevie Ann M.D.   On: 12/15/2016 10:59     PATHOLOGY:    ASSESSMENT AND PLAN:  CML (chronic myeloid leukemia) ([place]) CML, on Tasigna beginning on [id number], complicated by pericardial effusion in November 2017 requiring pericardial window.  Tasigna restarted on 12/06/2016.  Labs today: CBC diff, CMET, magnesium, phosphorus.  I personally reviewed and went over laboratory results with the patient.  The results are noted within this dictation.  Labs demonstrate a mild leukocytosis with worsening anemia, and thrombocytosis.  She asks if TSH can be added to labs today.  I will order and see.  Given her lab changes, anemia panel is added to her next lab check.  Labs in 2 weeks: CBC diff, CMET, magnesium, phosphorus, and anemia panel.  She reports night sweats, between 2-4 AM.  She reports rare day time night sweats.  She denies any fevers.    Chest xray today.  Order is placed.  We are waiting to hear back about co-pay assistance regarding her Tasigna.  All paper work has been submitted.  We expect to hear something with the next 1-2 weeks, but [employer] reports responses up to March 2018.  She will follow-up with Dr. Roxan Hockey as scheduled on 01/18/17 with CT imaging.  Return in 2 weeks for follow-up with labs and EKG.  We expect to hear back  from Dr. Florene Glen any day now.  If not by next week, then we will call him for an update on PCR and ABL Kinase Domain Mutation.   ORDERS PLACED FOR THIS ENCOUNTER: Orders Placed This Encounter  Procedures  . DG Chest 2 View  . CBC with Differential  . Comprehensive metabolic panel  . Magnesium  . Phosphorus  .  TSH  . Vitamin B12  . Folate  . Iron and TIBC  . Ferritin    MEDICATIONS PRESCRIBED THIS ENCOUNTER: Meds ordered this encounter  Medications  . cephALEXin (KEFLEX) 500 MG capsule    THERAPY PLAN:  Continue with Tasigna as prescribed.  All questions were answered. The patient knows to call the clinic with any problems, questions or concerns. We can certainly see the patient much sooner if necessary.  Patient and plan discussed with Dr. Ancil Linsey and she is in agreement with the aforementioned.   This note is electronically signed by: Doy Mince 12/31/2016 3:59 PM

## 2017-01-05 DIAGNOSIS — C9210 Chronic myeloid leukemia, BCR/ABL-positive, not having achieved remission: Secondary | ICD-10-CM | POA: Diagnosis not present

## 2017-01-10 ENCOUNTER — Encounter (HOSPITAL_COMMUNITY): Payer: Self-pay | Admitting: Hematology & Oncology

## 2017-01-11 ENCOUNTER — Other Ambulatory Visit (HOSPITAL_COMMUNITY): Payer: Self-pay | Admitting: Oncology

## 2017-01-11 DIAGNOSIS — C9210 Chronic myeloid leukemia, BCR/ABL-positive, not having achieved remission: Secondary | ICD-10-CM

## 2017-01-11 MED ORDER — NILOTINIB HCL 150 MG PO CAPS: 300.0000 mg | ORAL_CAPSULE | Freq: Two times a day (BID) | ORAL | 11 refills | Status: DC

## 2017-01-17 ENCOUNTER — Ambulatory Visit (HOSPITAL_COMMUNITY): Payer: Medicare Other | Admitting: Hematology & Oncology

## 2017-01-17 ENCOUNTER — Encounter (HOSPITAL_COMMUNITY): Payer: Self-pay | Admitting: Adult Health

## 2017-01-17 ENCOUNTER — Other Ambulatory Visit (HOSPITAL_COMMUNITY): Payer: Medicare Other

## 2017-01-17 ENCOUNTER — Encounter (HOSPITAL_COMMUNITY): Payer: Medicare Other

## 2017-01-17 ENCOUNTER — Other Ambulatory Visit: Payer: Self-pay

## 2017-01-17 ENCOUNTER — Encounter (HOSPITAL_COMMUNITY): Payer: Medicare Other | Attending: Oncology | Admitting: Adult Health

## 2017-01-17 ENCOUNTER — Telehealth (HOSPITAL_COMMUNITY): Payer: Self-pay | Admitting: Adult Health

## 2017-01-17 ENCOUNTER — Telehealth (HOSPITAL_COMMUNITY): Payer: Self-pay | Admitting: *Deleted

## 2017-01-17 VITALS — BP 133/61 | HR 88 | Temp 98.7°F | Resp 18 | Ht 65.0 in | Wt 189.5 lb

## 2017-01-17 DIAGNOSIS — J90 Pleural effusion, not elsewhere classified: Secondary | ICD-10-CM

## 2017-01-17 DIAGNOSIS — R61 Generalized hyperhidrosis: Secondary | ICD-10-CM | POA: Diagnosis not present

## 2017-01-17 DIAGNOSIS — E039 Hypothyroidism, unspecified: Secondary | ICD-10-CM | POA: Insufficient documentation

## 2017-01-17 DIAGNOSIS — C9210 Chronic myeloid leukemia, BCR/ABL-positive, not having achieved remission: Secondary | ICD-10-CM | POA: Diagnosis not present

## 2017-01-17 DIAGNOSIS — Z79899 Other long term (current) drug therapy: Secondary | ICD-10-CM | POA: Diagnosis not present

## 2017-01-17 DIAGNOSIS — Z5181 Encounter for therapeutic drug level monitoring: Secondary | ICD-10-CM | POA: Insufficient documentation

## 2017-01-17 DIAGNOSIS — I313 Pericardial effusion (noninflammatory): Secondary | ICD-10-CM | POA: Diagnosis not present

## 2017-01-17 LAB — CBC WITH DIFFERENTIAL/PLATELET
Basophils Absolute: 0 10*3/uL (ref 0.0–0.1)
Basophils Relative: 0 %
Eosinophils Absolute: 0.1 10*3/uL (ref 0.0–0.7)
Eosinophils Relative: 2 %
HEMATOCRIT: 31.6 % — AB (ref 36.0–46.0)
HEMOGLOBIN: 9.9 g/dL — AB (ref 12.0–15.0)
LYMPHS ABS: 1.4 10*3/uL (ref 0.7–4.0)
LYMPHS PCT: 16 %
MCH: 26.6 pg (ref 26.0–34.0)
MCHC: 31.3 g/dL (ref 30.0–36.0)
MCV: 84.9 fL (ref 78.0–100.0)
Monocytes Absolute: 0.7 10*3/uL (ref 0.1–1.0)
Monocytes Relative: 8 %
NEUTROS PCT: 74 %
Neutro Abs: 6.5 10*3/uL (ref 1.7–7.7)
Platelets: 376 10*3/uL (ref 150–400)
RBC: 3.72 MIL/uL — AB (ref 3.87–5.11)
RDW: 16.5 % — ABNORMAL HIGH (ref 11.5–15.5)
WBC: 8.8 10*3/uL (ref 4.0–10.5)

## 2017-01-17 LAB — MAGNESIUM: MAGNESIUM: 1.9 mg/dL (ref 1.7–2.4)

## 2017-01-17 LAB — COMPREHENSIVE METABOLIC PANEL
ALT: 22 U/L (ref 14–54)
AST: 35 U/L (ref 15–41)
Albumin: 3.4 g/dL — ABNORMAL LOW (ref 3.5–5.0)
Alkaline Phosphatase: 158 U/L — ABNORMAL HIGH (ref 38–126)
Anion gap: 9 (ref 5–15)
BILIRUBIN TOTAL: 0.8 mg/dL (ref 0.3–1.2)
BUN: 23 mg/dL — ABNORMAL HIGH (ref 6–20)
CALCIUM: 9.1 mg/dL (ref 8.9–10.3)
CO2: 26 mmol/L (ref 22–32)
Chloride: 101 mmol/L (ref 101–111)
Creatinine, Ser: 0.79 mg/dL (ref 0.44–1.00)
Glucose, Bld: 127 mg/dL — ABNORMAL HIGH (ref 65–99)
Potassium: 4 mmol/L (ref 3.5–5.1)
Sodium: 136 mmol/L (ref 135–145)
Total Protein: 7.7 g/dL (ref 6.5–8.1)

## 2017-01-17 LAB — PHOSPHORUS: PHOSPHORUS: 4.1 mg/dL (ref 2.5–4.6)

## 2017-01-17 LAB — TSH: TSH: 10.31 u[IU]/mL — ABNORMAL HIGH (ref 0.350–4.500)

## 2017-01-17 NOTE — Patient Instructions (Addendum)
[place] at [facility] Discharge Instructions  RECOMMENDATIONS MADE BY THE CONSULTANT AND ANY TEST RESULTS WILL BE SENT TO YOUR REFERRING PHYSICIAN.  You were seen today by Mike Craze NP. Return in 1 month for labs, and follow up.    Thank you for choosing [place] at [facility] to provide your oncology and hematology care.  To afford each patient quality time with our provider, please arrive at least 15 minutes before your scheduled appointment time.    If you have a lab appointment with the [facility] please come in thru the  Main Entrance and check in at the main information desk  You need to re-schedule your appointment should you arrive 10 or more minutes late.  We strive to give you quality time with our providers, and arriving late affects you and other patients whose appointments are after yours.  Also, if you no show three or more times for appointments you may be dismissed from the clinic at the providers discretion.     Again, thank you for choosing [facility].  Our hope is that these requests will decrease the amount of time that you wait before being seen by our physicians.       _____________________________________________________________  Should you have questions after your visit to [facility], please contact our office at [phone] between the hours of 8:30 a.m. and 4:30 p.m.  Voicemails left after 4:30 p.m. will not be returned until the following business day.  For prescription refill requests, have your pharmacy contact our office.       Resources For Cancer Patients and their Caregivers ? American Cancer Society: Can assist with transportation, wigs, general needs, runs Look Good Feel Better.        501-022-7499 ? Cancer Care: Provides financial assistance, online support groups, medication/co-pay assistance.  1-800-813-HOPE (216) 559-1477) ? [facility] Assists [place] cancer patients and their families through emotional , educational and financial support.  470-021-6356 ? [place] DSS Where to apply for food stamps, Medicaid and utility assistance. 575 744 2913 ? [employer]: Transportation to medical appointments. 579 282 9711 ? Social Security Administration: May apply for disability if have a Stage IV cancer. 445-565-4572 6701533254 ? [employer], Disability and Transit Services: Assists with nutrition, care and transit needs. [place] Support Programs: @10RELATIVEDAYS @ > Cancer Support Group  2nd Tuesday of the month 1pm-2pm, Journey Room  > Creative Journey  3rd Tuesday of the month 1130am-1pm, Journey Room  > Look Good Feel Better  1st Wednesday of the month 10am-12 noon, Journey Room (Call [place] to register [phone])

## 2017-01-17 NOTE — Telephone Encounter (Signed)
Pt advised that TSH is elevated and that she needs to follow up with her PCP about this. Pt states that she was given medication when she had her surgery but that her PCP was not the one who gave it to her. I advised the pt to call her PCP and let him know that she has had an elevated level and he would know if she needs to be on medication or not. Pt verbalized understanding.

## 2017-01-17 NOTE — Progress Notes (Signed)
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 Florene Glen [facility]      11/21/2015 -  Chemotherapy    Hydrea 1000 mg and Allopurinol 300 mg daily (due to high uric acid level)      11/21/2015 Tumor Marker    BCR P210 Ratio of Fusion to Control, Blood 0.8941    P210 IS % Ratio, Blood 85.834%        01/02/2016 - 10/23/2016 Chemotherapy    Tasigna      01/08/2016 Miscellaneous    EKG- QT/QTc: 382/440 ms      01/16/2016 Miscellaneous    EKG- QT/QTc: 364/419      03/30/2016 Tumor Marker    b2a2 transcript: 0.317 b3a2 transcript: 0.194      10/23/2016 - 11/05/2016 Hospital Admission    large Left Pleural Effusion and Pericardial Effusion that is now s/p VATS with Drainage, and Drainage of Pericardial Fluid with Window and Biopsy of Pericardium      12/06/2016 -  Chemotherapy    Tasigna restarted         HISTORY OF PRESENT ILLNESS:  (From Robynn Pane, PA-C's note on 12/31/2016) Andrea Martinez 71 y.o. female returns for followup of CML, on Tasigna beginning on [id number], complicated by pericardial effusion in November 2017 requiring pericardial window.  Tasigna restarted on 12/06/2016.    INTERVAL HISTORY:  Andrea Martinez, 71 years old presents to the clinic today for follow up of CML. She is accompanied by her husband.    She reports ongoing night sweats. States that she has night sweats 2-3 times a night and notes they are drenching night sweats, requiring frequent bed linen changes.  Night sweats are notably worse since her recent  pericardial window surgery in November; she is hopeful the night sweats will improved after she takes the Tasigna for a bit longer period of time. She denies accompanying hot flashes. The night sweats severely impact her ability to get a good night's rest. She isn't sure if she wants to take any medication to help her sleep.  Ms. Loseke tells me she has a CT of her chest and follow-up appt scheduled for tomorrow, 01/18/17, with her cardiothoracic surgeon, Dr. Roxan Hockey.   She tells me she was recently treated for pneumonia with antibiotics.  Her cough is improved. She now has vaginal itching, which she thinks is a yeast infection secondary to antibiotics.  She is using OTC Monistat; she cannot take Diflucan because it made her skin blister.   Denies rash, diarrhea, melena, dysuria, dizziness, and headaches.   I personally reviewed and went over labs with the patient.   REVIEW OF SYSTEMS:  Review of Systems  Constitutional: Positive for diaphoresis (drenching night sweats 2-3 times a night) and fatigue. Negative for chills and fever.       Denies hot flashes accompanying her night sweats  HENT:  Negative.   Eyes: Negative.   Respiratory: Positive for cough (cough improved from previous ). Negative for shortness of breath.   Cardiovascular: Negative.  Negative for chest  pain and palpitations.  Gastrointestinal: Negative.  Negative for blood in stool, constipation, diarrhea, nausea and vomiting.  Endocrine: Negative.   Genitourinary: Negative.  Negative for dysuria, hematuria and vaginal bleeding.   Musculoskeletal: Negative.   Skin: Positive for itching (vaginal itching, taking Monistat for it). Negative for rash.  Neurological: Negative.  Negative for dizziness and headaches.  Hematological: Negative.   Psychiatric/Behavioral: Positive for sleep disturbance (due to her night sweats).  All other systems reviewed and are negative.    PAST MEDICAL/SURGICAL HISTORY:  Past Medical History:    Diagnosis Date  . 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 Gastric ulcer 2011   EGD, 5/11  . Hypothyroidism    not on meds, followed by Dr. Elyse Hsu  . Leukocytosis 11/20/2015  . Lipoma    left upper arm  . PONV (postoperative nausea and vomiting)   . UTI (lower urinary tract infection)    frequent   Past Surgical History:  Procedure Laterality Date  . BRAVO [place] STUDY  12/04/2012   Procedure: BRAVO [place];  Surgeon: [name], MD;  [facility];  Service: Endoscopy;  Laterality: N/A;  . CHOLECYSTECTOMY N/A 01/03/2014   Procedure: LAPAROSCOPIC CHOLECYSTECTOMY WITH INTRAOPERATIVE CHOLANGIOGRAM;  Surgeon: Joyice Faster. Cornett, MD;  Location: [place] [facility];  Service: General;  Laterality: N/A;  . COLONOSCOPY  01/2004   [facility], few small tics  . ESOPHAGOGASTRODUODENOSCOPY  02/2010   gastric ulcers  . ESOPHAGOGASTRODUODENOSCOPY  12/04/2012   [id number] gastritis (inflammation) was found in the gastric antrum; multiple biopsies The duodenal mucosa showed no abnormalities in the bulb and second portion of the duodenum  . LIPOMA EXCISION  08/02/2011   left shoulder  . NOSE SURGERY    . PARTIAL HYSTERECTOMY     vaginal at age 62 years of age  . TOE DEBRIDEMENT  1962   lt great toe  . VIDEO ASSISTED THORACOSCOPY (VATS)/EMPYEMA Left 10/25/2016   Procedure: VIDEO ASSISTED THORACOSCOPY (VATS), BRONCH,DRAINAGE OF PLEURAL EFFUSION,PERICARDIAL WINDOW WITH DRAINAGE OF PERICARDIAL FLUID, TEE;  Surgeon: [name], MD;  Location: [facility];  Service: Thoracic;  Laterality: Left;  Marland Kitchen VIDEO BRONCHOSCOPY N/A 10/25/2016   Procedure: VIDEO BRONCHOSCOPY;  Surgeon: [name], MD;  Location: [facility];  Service: Thoracic;  Laterality: N/A;     SOCIAL HISTORY:  Social History   Social History  . Marital status: Married    Spouse name: N/A  . Number of children: 2  . Years of education: 12th grade   Occupational History  .  retired [employer]  .  Retired   Social History Main Topics  . Smoking status: Never Smoker  . Smokeless tobacco: Never Used  . Alcohol use No  . Drug use: No  . Sexual activity: Yes    Partners: Male    Birth control/ protection: None   Other Topics Concern  . Not on file   Social History Narrative  . No narrative on file    FAMILY HISTORY:  Family History  Problem Relation Age of Onset  . Lung cancer Father   . COPD Mother   . Colon cancer Neg Hx   . Anesthesia problems Neg Hx   . Hypotension Neg Hx   . Malignant hyperthermia Neg Hx   . Pseudochol deficiency Neg Hx     CURRENT MEDICATIONS:  Outpatient Encounter Prescriptions as of 01/17/2017  Medication Sig Note  . cephALEXin (KEFLEX) 500 MG capsule    . cetirizine (ZYRTEC)  10 MG tablet Take 10 mg by mouth daily.    . Cholecalciferol (VITAMIN D-3) 5000 UNITS TABS Take 5,000 Units by mouth daily.     Marland Kitchen escitalopram (LEXAPRO) 20 MG tablet Take 1.5 tablets (30 mg total) by mouth daily.   . Multiple Vitamins-Minerals (CENTRUM SILVER PO) Take 1 capsule by mouth daily.   . nilotinib (TASIGNA) 150 MG capsule Take 2 capsules (300 mg total) by mouth every 12 (twelve) hours.   . traMADol (ULTRAM) 50 MG tablet Take 1 tablet (50 mg total) by mouth every 6 (six) hours as needed. 10/23/2016: Normally takes in the morning  . vitamin B-12 (CYANOCOBALAMIN) 1000 MCG tablet Take 5,000 mcg by mouth daily.    . [DISCONTINUED] aspirin 325 MG tablet Take 1 tablet (325 mg total) by mouth daily.   . [DISCONTINUED] ipratropium-albuterol (DUONEB) 0.5-2.5 (3) MG/3ML SOLN Take 3 mLs by nebulization 3 (three) times daily.    No facility-administered encounter medications on file as of 01/17/2017.      ALLERGIES:  Allergies  Allergen Reactions  . Codeine Nausea Only    Headache  . Esomeprazole Magnesium Nausea Only  . Sulfonamide Derivatives Nausea Only    Headache  . Fluconazole Rash    Redness and blistering on left thigh     Vitals:    01/17/17 0927  BP: 133/61  Pulse: 88  Resp: 18  Temp: 98.7 F (37.1 C)   Filed Weights   01/17/17 0927  Weight: 189 lb 8 oz (86 kg)   PHYSICAL EXAM:  ECOG Performance status: 1 - Symptomatic, but independent   Physical Exam  Constitutional: She is oriented to person, place, and time and well-developed, well-nourished, and in no distress.  HENT:  Head: Normocephalic and atraumatic.  Mouth/Throat: Oropharynx is clear and moist. No oropharyngeal exudate.  Eyes: Conjunctivae are normal. Pupils are equal, round, and reactive to light. No scleral icterus.  Neck: Normal range of motion. Neck supple.  Cardiovascular: Normal rate, regular rhythm and normal heart sounds.   Pulmonary/Chest: Effort normal and breath sounds normal.  Abdominal: Soft. Bowel sounds are normal. She exhibits no distension. There is no rebound.  Intermittent right lower quadrant tenderness to palpation  Musculoskeletal: Normal range of motion. She exhibits no edema.  Lymphadenopathy:    She has no cervical adenopathy.       Right: No supraclavicular adenopathy present.       Left: No supraclavicular adenopathy present.  Neurological: She is alert and oriented to person, place, and time. No cranial nerve deficit. Gait normal.  Skin: Skin is warm and dry. No rash noted.  Psychiatric: Memory and judgment normal.  Mildly anxious mood & affect   Nursing note and vitals reviewed.    LABORATORY DATA:  I have reviewed the labs as listed.  CBC    Component Value Date/Time   WBC 8.8 01/17/2017 0834   RBC 3.72 (L) 01/17/2017 0834   HGB 9.9 (L) 01/17/2017 0834   HCT 31.6 (L) 01/17/2017 0834   PLT 376 01/17/2017 0834   MCV 84.9 01/17/2017 0834   MCH 26.6 01/17/2017 0834   MCHC 31.3 01/17/2017 0834   RDW 16.5 (H) 01/17/2017 0834   LYMPHSABS 1.4 01/17/2017 0834   MONOABS 0.7 01/17/2017 0834   EOSABS 0.1 01/17/2017 0834   BASOSABS 0.0 01/17/2017 0834   CMP Latest Ref Rng & Units 01/17/2017 12/31/2016 11/18/2016    Glucose 65 - 99 mg/dL 127(H) 113(H) 114(H)  BUN 6 - 20 mg/dL 23(H) 20 16  Creatinine 0.44 - 1.00 mg/dL 0.79 0.77 0.97  Sodium 135 - 145 mmol/L 136 137 137  Potassium 3.5 - 5.1 mmol/L 4.0 4.4 4.2  Chloride 101 - 111 mmol/L 101 101 100(L)  CO2 22 - 32 mmol/L 26 26 28   Calcium 8.9 - 10.3 mg/dL 9.1 9.0 9.2  Total Protein 6.5 - 8.1 g/dL 7.7 8.0 7.4  Total Bilirubin 0.3 - 1.2 mg/dL 0.8 0.6 0.5  Alkaline Phos 38 - 126 U/L 158(H) 171(H) 114  AST 15 - 41 U/L 35 37 39  ALT 14 - 54 U/L 22 23 23     Results for LAZIYAH, KWIECINSKI (MRN [id number])   Ref. Range 12/31/2016 13:14 01/17/2017 08:34  TSH Latest Ref Range: 0.350 - 4.500 uIU/mL 8.119 (H) 10.310 (H)   PENDING LABS:    DIAGNOSTIC IMAGING:  I have reviewed & agree with the reported results. CT chest: 10/23/16    Chest X-ray: 12/31/16      PATHOLOGY:     EKG:  Most recent EKG: 01/17/17     ASSESSMENT & PLAN:  Ms. Goldberg is a pleasant 71 y.o. female with CML, BCR-ABL1+; started on Tasigna on 01/02/16. Treatment course complicated by left pleural effusion & pericardial effusion requiring pericardial window in 10/2016. Tasigna restarted on 12/06/16.    CML, BCR-ABL1+: -Lab results reviewed with patient; they are largely stable, with the exception of her TSH (which was added on to today's collected blood d/t increased TSH 2 weeks ago).   -EKG done today; no QTc interval prolongation.  -No hypokalemia. Maintain current dose and treatment schedule for Tasigna.  -She is working with Janace Hoard, Hospital doctor, as well as [employer] for co-pay assistance.  -Return to cancer center in 1 month for continued follow-up and lab work. Will add on serum magnesium for next lab visit to ensure electrolyte stability with Tasigna. Consider follow-up EKG at next visit in 4 weeks as well given her h/o pericardial effusion.   Night sweats: -Likely secondary to disease process and re-introduction of Tasigna after 42-month break in therapy d/t  pleural and pericardial effusions.  Night sweats will hopefully improve over the next several weeks.  -Offered her medications to help her rest better at night, but she is not sure she wants to take any medications to help her sleep.  Considering starting low-dose Temazepam at bedtime. Encouraged her to call us if she decides she would like a sleep aid and would be happy to give her a prescription.   Pleural effusion/Pericardial effusion:  -Due for repeat CT chest and follow-up with cardiothoracic surgeon, Dr. Roxan Hockey, tomorrow 01/18/17.    Hypothyroidism:  -TSH remains elevated at 10.310 today; last TSH also elevated at 8.119 on 12/31/16.  Literature review in Up-to-Date does not suggest any correlation with Tasigna and hypothyroidism as a known side effect.  Therefore, I will have my nurse give the patient a call and encourage her to follow-up with her PCP for consideration of treatment for hypothyroidism or additional work-up, as clinically indicated.      Dispo:  -Return to cancer center in 1 month for continued follow-up with labs.       All questions were answered to patient's stated satisfaction. Encouraged patient to call with any new concerns or questions before her next visit to the cancer center and we can certain see her sooner, if needed.    This document serves as a record of services personally performed by Mike Craze, NP. It was created on her behalf by  Shirlean Mylar, a trained medical scribe. The creation of this record is based on the scribe's personal observations and the provider's statements to them. This document has been checked and approved by the attending provider.   I have reviewed the above documentation for accuracy and completeness and I agree with the above.  Mike Craze, NP [place] [phone]

## 2017-01-17 NOTE — Telephone Encounter (Signed)
Pc to [employer] [phone]. Per CSR pts application [facility] to [facility] to see if they can assist the pt with her meds. If denied [employer] will supply pt with rx. Pt has one more free refill on Tasigna while app is pending with [facility]

## 2017-01-17 NOTE — Pre-Procedure Instructions (Signed)
ECG

## 2017-01-18 ENCOUNTER — Ambulatory Visit
Admission: RE | Admit: 2017-01-18 | Discharge: 2017-01-18 | Disposition: A | Discharge status: Home / Self Care | Payer: Medicare Other | Source: Ambulatory Visit | Attending: Thoracic Surgery (Cardiothoracic Vascular Surgery) | Admitting: Thoracic Surgery (Cardiothoracic Vascular Surgery)

## 2017-01-18 ENCOUNTER — Ambulatory Visit: Payer: Self-pay | Admitting: Thoracic Surgery (Cardiothoracic Vascular Surgery)

## 2017-01-18 DIAGNOSIS — R911 Solitary pulmonary nodule: Secondary | ICD-10-CM | POA: Diagnosis not present

## 2017-01-18 NOTE — Progress Notes (Unsigned)
This encounter was created in error - please disregard.  This encounter was created in error - please disregard.

## 2017-01-19 ENCOUNTER — Other Ambulatory Visit (HOSPITAL_COMMUNITY): Payer: Medicare Other

## 2017-01-19 ENCOUNTER — Ambulatory Visit (HOSPITAL_COMMUNITY): Payer: Medicare Other | Admitting: Hematology & Oncology

## 2017-01-20 ENCOUNTER — Ambulatory Visit (INDEPENDENT_AMBULATORY_CARE_PROVIDER_SITE_OTHER): Payer: Medicare Other | Admitting: Thoracic Surgery (Cardiothoracic Vascular Surgery)

## 2017-01-20 ENCOUNTER — Encounter: Payer: Self-pay | Admitting: Thoracic Surgery (Cardiothoracic Vascular Surgery)

## 2017-01-20 VITALS — BP 140/78 | HR 96 | Resp 20 | Ht 65.0 in | Wt 189.0 lb

## 2017-01-20 DIAGNOSIS — R911 Solitary pulmonary nodule: Secondary | ICD-10-CM | POA: Diagnosis not present

## 2017-01-20 DIAGNOSIS — J90 Pleural effusion, not elsewhere classified: Secondary | ICD-10-CM | POA: Diagnosis not present

## 2017-01-20 NOTE — Progress Notes (Signed)
[address]       [place], [address]             [phone]       HPI: His Shillingford returns for follow-up regarding a right lung nodule.  She is a 71 year old woman with a history of CML stented back in November with shortness of breath. She had a large, complicated left pleural and pericardial effusion. She had a left VATS for drainage of the effusions on 10/25/2016. She was discharged on postoperative day #10. During her hospitalization she had a CT of the chest which showed an 8 mm right lower lobe nodule in addition to the large left pleural effusion.  She still has an occasional twinge of pain on the left side but is not taking anything for that. She complains of some pain on the right side and flank that comes in "waves." She also complains of night sweats secondary to Tasigna.  Past Medical History:  Diagnosis Date  . 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 Gastric ulcer 2011   EGD, 5/11  . Hypothyroidism    not on meds, followed by Dr. Elyse Hsu  . Leukocytosis 11/20/2015  . Lipoma    left upper arm  . PONV (postoperative nausea and vomiting)   . UTI (lower urinary tract infection)    frequent     Current Outpatient Prescriptions  Medication Sig Dispense Refill  . cetirizine (ZYRTEC) 10 MG tablet Take 10 mg by mouth daily.     . Cholecalciferol (VITAMIN D-3) 5000 UNITS TABS Take 5,000 Units by mouth daily.      Marland Kitchen escitalopram (LEXAPRO) 20 MG tablet Take 1.5 tablets (30 mg total) by mouth daily. 60 tablet 3  . Multiple Vitamins-Minerals (CENTRUM SILVER PO) Take 1 capsule by mouth daily.    . nilotinib (TASIGNA) 150 MG capsule Take 2 capsules (300 mg total) by mouth every 12 (twelve) hours. 120 capsule 11  . traMADol (ULTRAM) 50 MG tablet Take 1 tablet (50 mg total) by mouth every 6 (six) hours as needed. 90 tablet 0  . vitamin B-12 (CYANOCOBALAMIN) 1000 MCG tablet Take 5,000 mcg by mouth daily.      No current  facility-administered medications for this visit.     Physical Exam BP 140/78   Pulse 96   Resp 20   Ht 5\' 5"  (1.651 m)   Wt 189 lb (85.7 kg)   SpO2 99% Comment: RA  BMI 31.26 kg/m  71 year old woman in no acute distress Alert and oriented 3 with no focal deficits Lungs clear bilaterally with equal breath sounds Cardiac regular rate and rhythm normal S1 and S2 Left chest incision well-healed  Diagnostic Tests: CT CHEST WITHOUT CONTRAST  TECHNIQUE: Multidetector CT imaging of the chest was performed following the standard protocol without IV contrast.  COMPARISON:  Chest x-ray of 12/31/2016, 12/15/2016, and CT chest of 10/23/2016  FINDINGS: Cardiovascular: Mild thoracic aortic atherosclerosis is present. The heart is mildly enlarged with minimal thickening of the pericardium. No significant coronary artery calcifications are evident. The mid ascending thoracic aorta measures 31 mm in diameter.  Mediastinum/Nodes: On this unenhanced study, no mediastinal or hilar adenopathy is seen, with only small mediastinal and hilar lymph nodes present. The thyroid gland is unremarkable.  Lungs/Pleura: On the prior CT of the chest there was a large left pleural effusion which by obscured visualization of the left lung. Linear atelectasis or scarring is noted throughout much of the left  lower lobe and lingula, with mild pleural thickening or residual left pleural effusion at the left lung base. The 8 mm oval nodule in the anterior basal right lower lobe is unchanged and does appear to be associated with linear scarring extending to the pleura anterolaterally. Additionally, there is a small 5 mm noncalcified nodule in the posterior right lower lobe which is stable. No new pulmonary nodule is seen.  Upper Abdomen: No abnormality of the upper abdomen is seen.  Musculoskeletal: There are degenerative changes in the mid to lower thoracic spine. No compression deformity is  seen.  IMPRESSION: 1. Stable 8 mm oval lung nodule in the right lower lobe near the lung base anteriorly associated with linear scarring. Consider continued followup in 1 year if the patient is considered high risk. 2. Mild thoracic aortic atherosclerosis. 3. Minimal residual left pleural effusion versus pleural thickening.   Electronically Signed   By: Ivar Drape M.D.   On: 01/18/2017 10:15 I personally reviewed the CT chest and concur with the findings noted above.  Impression: 71 year old woman with a history of CML who had a large left effusion and pericardial effusion drained with a left VATS back in November. She recovered well from the surgery and has minimal residual pain associated with that.  She had a CT during that admission which showed an 8 mm nodule in the right lung base. I recommended a repeat CT to reevaluate that area and also look better at the left lung. The 8 mm lung nodule is unchanged. It does appear to be associated with some scarring in the right lower lobe. She has a second 5 mm right lower lobe nodule that is stable as well.  I recommended that we repeat a CT scan in 6 months to check these nodules.  Right flank pain- I doubt this is associated with the lung nodule. It doesn't really sound pleuritic in nature. She's had a cholecystectomy. It sounds like it could be renal in nature. If it persists or worsens she should check with her primary physician.  Plan: Return in 6 months with CT chest  [name], MD [facility] [phone]

## 2017-01-28 ENCOUNTER — Telehealth (HOSPITAL_COMMUNITY): Payer: Self-pay | Admitting: Oncology

## 2017-01-28 NOTE — Telephone Encounter (Signed)
Spoke with Dr. Florene Glen about this patient for an update on the Kinase mutation test.  He remembers the result not leading to any necessary change in therapy, but he will call back later on Monday to confirm.  Robynn Pane, PA-C 01/28/2017 12:50 PM

## 2017-02-03 ENCOUNTER — Other Ambulatory Visit (HOSPITAL_COMMUNITY): Payer: Self-pay | Admitting: Pharmacist

## 2017-02-07 ENCOUNTER — Telehealth (HOSPITAL_COMMUNITY): Payer: Self-pay | Admitting: Oncology

## 2017-02-07 NOTE — Telephone Encounter (Signed)
Dr. Florene Glen called.  He denies finding anything particular regarding her lab test that would require a change in therapy at this time.  Andrea Martinez 02/07/2017 4:33 PM

## 2017-02-14 ENCOUNTER — Other Ambulatory Visit: Payer: Self-pay

## 2017-02-14 ENCOUNTER — Encounter (HOSPITAL_COMMUNITY): Payer: Medicare Other | Attending: Oncology | Admitting: Oncology

## 2017-02-14 ENCOUNTER — Telehealth (HOSPITAL_COMMUNITY): Payer: Self-pay | Admitting: Oncology

## 2017-02-14 ENCOUNTER — Encounter (HOSPITAL_COMMUNITY): Payer: Self-pay

## 2017-02-14 ENCOUNTER — Encounter (HOSPITAL_COMMUNITY): Payer: Medicare Other | Attending: Hematology & Oncology

## 2017-02-14 VITALS — BP 140/72 | HR 83 | Temp 98.1°F | Resp 18 | Wt 192.5 lb

## 2017-02-14 DIAGNOSIS — C9210 Chronic myeloid leukemia, BCR/ABL-positive, not having achieved remission: Secondary | ICD-10-CM

## 2017-02-14 LAB — IRON AND TIBC
Iron: 26 ug/dL — ABNORMAL LOW (ref 28–170)
Saturation Ratios: 8 % — ABNORMAL LOW (ref 10.4–31.8)
TIBC: 329 ug/dL (ref 250–450)
UIBC: 303 ug/dL

## 2017-02-14 LAB — COMPREHENSIVE METABOLIC PANEL
ALT: 29 U/L (ref 14–54)
AST: 41 U/L (ref 15–41)
Albumin: 4.1 g/dL (ref 3.5–5.0)
Alkaline Phosphatase: 157 U/L — ABNORMAL HIGH (ref 38–126)
Anion gap: 8 (ref 5–15)
BUN: 23 mg/dL — AB (ref 6–20)
CHLORIDE: 103 mmol/L (ref 101–111)
CO2: 26 mmol/L (ref 22–32)
CREATININE: 0.79 mg/dL (ref 0.44–1.00)
Calcium: 9.1 mg/dL (ref 8.9–10.3)
GFR calc Af Amer: >60 mL/min (ref >60)
Glucose, Bld: 99 mg/dL (ref 65–99)
Potassium: 3.9 mmol/L (ref 3.5–5.1)
Sodium: 137 mmol/L (ref 135–145)
Total Bilirubin: 0.8 mg/dL (ref 0.3–1.2)
Total Protein: 7.6 g/dL (ref 6.5–8.1)

## 2017-02-14 LAB — CBC WITH DIFFERENTIAL/PLATELET
BASOS ABS: 0 10*3/uL (ref 0.0–0.1)
Basophils Relative: 0 %
EOS PCT: 3 %
Eosinophils Absolute: 0.2 10*3/uL (ref 0.0–0.7)
HEMATOCRIT: 35.1 % — AB (ref 36.0–46.0)
Hemoglobin: 10.9 g/dL — ABNORMAL LOW (ref 12.0–15.0)
LYMPHS PCT: 21 %
Lymphs Abs: 1.6 10*3/uL (ref 0.7–4.0)
MCH: 26.4 pg (ref 26.0–34.0)
MCHC: 31.1 g/dL (ref 30.0–36.0)
MCV: 85 fL (ref 78.0–100.0)
Monocytes Absolute: 0.4 10*3/uL (ref 0.1–1.0)
Monocytes Relative: 6 %
NEUTROS ABS: 5.2 10*3/uL (ref 1.7–7.7)
Neutrophils Relative %: 70 %
PLATELETS: 273 10*3/uL (ref 150–400)
RBC: 4.13 MIL/uL (ref 3.87–5.11)
RDW: 16.7 % — ABNORMAL HIGH (ref 11.5–15.5)
WBC: 7.4 10*3/uL (ref 4.0–10.5)

## 2017-02-14 LAB — FOLATE: Folate: 34.7 ng/mL (ref >5.9)

## 2017-02-14 LAB — FERRITIN: Ferritin: 106 ng/mL (ref 11–307)

## 2017-02-14 LAB — MAGNESIUM: Magnesium: 1.9 mg/dL (ref 1.7–2.4)

## 2017-02-14 LAB — VITAMIN B12: Vitamin B-12: 1283 pg/mL — ABNORMAL HIGH (ref 180–914)

## 2017-02-14 NOTE — Telephone Encounter (Signed)
Returned pts call but had to leave a vm

## 2017-02-14 NOTE — Progress Notes (Signed)
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 Florene Glen [facility]      11/21/2015 -  Chemotherapy    Hydrea 1000 mg and Allopurinol 300 mg daily (due to high uric acid level)      11/21/2015 Tumor Marker    BCR P210 Ratio of Fusion to Control, Blood 0.8941    P210 IS % Ratio, Blood 85.834%        01/02/2016 - 10/23/2016 Chemotherapy    Tasigna      01/08/2016 Miscellaneous    EKG- QT/QTc: 382/440 ms      01/16/2016 Miscellaneous    EKG- QT/QTc: 364/419      03/30/2016 Tumor Marker    b2a2 transcript: 0.317 b3a2 transcript: 0.194      10/23/2016 - 11/05/2016 Hospital Admission    large Left Pleural Effusion and Pericardial Effusion that is now s/p VATS with Drainage, and Drainage of Pericardial Fluid with Window and Biopsy of Pericardium      12/06/2016 -  Chemotherapy    Tasigna restarted        HISTORY OF PRESENT ILLNESS:   Andrea Martinez is a 71 y.o. white American female here for additional f/u of CML. She was seen in consultation with Dr. Florene Glen.   She was admitted from 10/23/2016 through 11/05/2016 with large Left Pleural Effusion and Pericardial Effusion that is now s/p VATS with Drainage, and Drainage of Pericardial Fluid with Window and Biopsy of Pericardium with Dr. Roxan Hockey. Tasigna was held during her hospitalization and through follow-up with me until she was seen by Dr. Florene Glen at [facility].   She has had no problems with the Tasigna. She has been having a lot of night sweats since her Tasigna was stopped when she was in the hospital. Even after restarting it, the night sweats are still present. This is causing a lot  of fatigue from lack of sleep. She says that her bladder has dropped, causing some incontinence. She has leg swelling, which is normal for her. Intermittent right lower quadrant abdominal pain. Denies SOB, chest pain, or any other concerns.   PAST MEDICAL HISTORY:   Past Medical History:  Diagnosis Date  . 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 Gastric ulcer 2011   EGD, 5/11  . Hypothyroidism    not on meds, followed by Dr. Elyse Hsu  . Leukocytosis 11/20/2015  . Lipoma    left upper arm  . PONV (postoperative nausea and vomiting)   . UTI (lower urinary tract infection)    frequent    ALLERGIES: Allergies  Allergen Reactions  . Codeine Nausea Only    Headache  . Esomeprazole Magnesium Nausea Only  . Sulfonamide Derivatives Nausea Only    Headache  . Fluconazole Rash    Redness and blistering on left thigh      MEDICATIONS: I have reviewed the patient's current medications.    Current Outpatient Prescriptions on File Prior to Visit  Medication Sig Dispense Refill  . cetirizine (ZYRTEC) 10 MG tablet Take 10 mg by mouth daily.     . Cholecalciferol (VITAMIN  D-3) 5000 UNITS TABS Take 5,000 Units by mouth daily.      Marland Kitchen escitalopram (LEXAPRO) 20 MG tablet Take 1.5 tablets (30 mg total) by mouth daily. 60 tablet 3  . Multiple Vitamins-Minerals (CENTRUM SILVER PO) Take 1 capsule by mouth daily.    . nilotinib (TASIGNA) 150 MG capsule Take 2 capsules (300 mg total) by mouth every 12 (twelve) hours. 120 capsule 11  . traMADol (ULTRAM) 50 MG tablet Take 1 tablet (50 mg total) by mouth every 6 (six) hours as needed. 90 tablet 0  . vitamin B-12 (CYANOCOBALAMIN) 1000 MCG tablet Take 5,000 mcg by mouth daily.      No current facility-administered medications on file prior to visit.      PAST SURGICAL HISTORY Past Surgical History:  Procedure Laterality Date  . BRAVO [place] STUDY  12/04/2012   Procedure: BRAVO [place];  Surgeon: [name], MD;   [facility];  Service: Endoscopy;  Laterality: N/A;  . CHOLECYSTECTOMY N/A 01/03/2014   Procedure: LAPAROSCOPIC CHOLECYSTECTOMY WITH INTRAOPERATIVE CHOLANGIOGRAM;  Surgeon: Joyice Faster. Cornett, MD;  Location: [place];  Service: General;  Laterality: N/A;  . COLONOSCOPY  01/2004   DR [facility], few small tics  . ESOPHAGOGASTRODUODENOSCOPY  02/2010   gastric ulcers  . ESOPHAGOGASTRODUODENOSCOPY  12/04/2012   HER:DEYCXKG gastritis (inflammation) was found in the gastric antrum; multiple biopsies The duodenal mucosa showed no abnormalities in the bulb and second portion of the duodenum  . LIPOMA EXCISION  08/02/2011   left shoulder  . NOSE SURGERY    . PARTIAL HYSTERECTOMY     vaginal at age 20 years of age  . TOE DEBRIDEMENT  1962   lt great toe  . VIDEO ASSISTED THORACOSCOPY (VATS)/EMPYEMA Left 10/25/2016   Procedure: VIDEO ASSISTED THORACOSCOPY (VATS), BRONCH,DRAINAGE OF PLEURAL EFFUSION,PERICARDIAL WINDOW WITH DRAINAGE OF PERICARDIAL FLUID, TEE;  Surgeon: [name], MD;  Location: [place];  Service: Thoracic;  Laterality: Left;  Marland Kitchen VIDEO BRONCHOSCOPY N/A 10/25/2016   Procedure: VIDEO BRONCHOSCOPY;  Surgeon: [name], MD;  Location: [place];  Service: Thoracic;  Laterality: N/A;    FAMILY HISTORY: Family History  Problem Relation Age of Onset  . Lung cancer Father   . COPD Mother   . Colon cancer Neg Hx   . Anesthesia problems Neg Hx   . Hypotension Neg Hx   . Malignant hyperthermia Neg Hx   . Pseudochol deficiency Neg Hx     SOCIAL HISTORY:  reports that she has never smoked. She has never used smokeless tobacco. She reports that she does not drink alcohol or use drugs.  PERFORMANCE STATUS: The patient's performance status is 1 - Symptomatic but completely ambulatory  REVIEW OF SYSTEMS: Review of Systems  Constitutional: Positive for malaise/fatigue.       Night sweats  HENT: Negative.   Eyes: Negative.   Respiratory: Negative.   Negative for shortness of breath.   Cardiovascular: Positive for leg swelling. Negative for chest pain.  Gastrointestinal: Positive for abdominal pain.  Genitourinary:       Incontinence   Musculoskeletal: Negative.   Skin: Negative.   Neurological: Negative.   Endo/Heme/Allergies: Negative.   Psychiatric/Behavioral: The patient has insomnia.   All other systems reviewed and are negative. 14 point review of systems was performed and is negative except as detailed under history of present illness and above  PHYSICAL EXAM: Most Recent Vital Signs: Blood pressure 140/72, pulse 83, temperature 98.1 F (36.7 C), temperature source  Oral, resp. rate 18, weight 192 lb 8 oz (87.3 kg), SpO2 96 %.   Physical Exam  Constitutional: She is oriented to person, place, and time and well-developed, well-nourished, and in no distress.  HENT:  Head: Normocephalic and atraumatic.  Mouth/Throat: No oropharyngeal exudate.  Eyes: EOM are normal. Pupils are equal, round, and reactive to light. No scleral icterus.  Neck: Normal range of motion. Neck supple.  Cardiovascular: Normal rate, regular rhythm and normal heart sounds.   Pulmonary/Chest: Effort normal and breath sounds normal. No respiratory distress. She has no wheezes. She has no rales. She exhibits no tenderness.  Abdominal: Soft. Bowel sounds are normal. She exhibits no distension. There is no tenderness.  Musculoskeletal: Normal range of motion. She exhibits edema (legs b/l).  Lymphadenopathy:    She has no cervical adenopathy.  Neurological: She is alert and oriented to person, place, and time. Gait normal.  Skin: Skin is warm and dry.  Psychiatric: Mood, memory, affect and judgment normal.  Nursing note and vitals reviewed.  LABORATORY DATA:  I have reviewed the data as listed.  CBC    Component Value Date/Time   WBC 7.4 02/14/2017 1443   RBC 4.13 02/14/2017 1443   HGB 10.9 (L) 02/14/2017 1443   HCT 35.1 (L) 02/14/2017 1443   PLT 273  02/14/2017 1443   MCV 85.0 02/14/2017 1443   MCH 26.4 02/14/2017 1443   MCHC 31.1 02/14/2017 1443   RDW 16.7 (H) 02/14/2017 1443   LYMPHSABS 1.6 02/14/2017 1443   MONOABS 0.4 02/14/2017 1443   EOSABS 0.2 02/14/2017 1443   BASOSABS 0.0 02/14/2017 1443   CMP     Component Value Date/Time   NA 137 02/14/2017 1443   K 3.9 02/14/2017 1443   CL 103 02/14/2017 1443   CO2 26 02/14/2017 1443   GLUCOSE 99 02/14/2017 1443   BUN 23 (H) 02/14/2017 1443   CREATININE 0.79 02/14/2017 1443   CREATININE 0.87 07/14/2011 0938   CALCIUM 9.1 02/14/2017 1443   PROT 7.6 02/14/2017 1443   ALBUMIN 4.1 02/14/2017 1443   AST 41 02/14/2017 1443   ALT 29 02/14/2017 1443   ALKPHOS 157 (H) 02/14/2017 1443   BILITOT 0.8 02/14/2017 1443   GFRNONAA >60 02/14/2017 1443   GFRAA >60 02/14/2017 1443   PATHOLOGY:   RADIOGRAPHIC STUDIES: I have personally reviewed the below radiographic studies and agree with their findings.   CT Chest wo Contrast 01/18/2017 IMPRESSION: 1. Stable 8 mm oval lung nodule in the right lower lobe near the lung base anteriorly associated with linear scarring. Consider continued followup in 1 year if the patient is considered high risk. 2. Mild thoracic aortic atherosclerosis. 3. Minimal residual left pleural effusion versus pleural thickening.  DG Chest View 12/31/2016 IMPRESSION: 1. Small residual left pleural effusion versus chronic pleural thickening. 2. Stable mild cardiomegaly. 3. Hyperexpanded lungs suggesting COPD.  PATHOLOGY:       EKG 02/14/17: NSR. QTC 459 ms.  ASSESSMENT/PLAN:  CML Anemia Tasigna 01/06/2016 Pericardial effusion Pleural effusion    Continue Tasigna.   Monitoring Response to TKI therapy  Bone marrow cytogenetics: at diagnosis, failure to reach response milestones and any sign of loss of response (defined as hematologic or cytogenetic relapse)  Quantitative RT-PCR using is preferred: at diagnosis, every 3 months after initiating  treatment, after BCR-ABL1 0.1% - , 1% (IS) has been achieved, every 3 months for 2 years and every 3 - 6 months thereafter. If there is 1 log increase in BCR-ABL1 transcript  levels with MMR, QPCR should be repeated in 1 - 3 months  BCR-ABL kinase domain mutation analysis: Chronic phase: failure to reach response milestones, any sign of loss of response (defined as hematologic or cytogenetic relapse) 1 - log increase in BCR-ABL1 transcript levels and loss of MMR. Disease progression to accelerated or blast phase  Criteria for Hematologic and molecular response   Complete Hematologic response Complete normalization of peripheral blood counts with leukocyte count ,10x103/L Platelet count < 450 x 109/L NO immature cells such as myelocytes, promyelocytes or blasts in peripheral blood No signs and symptoms of disease with disappearance of palpable splenomegaly  Molecular response Early molecular response  BCR-ABL1<= 10% (IS) at 3 and 6 months Major molecular response BCR-ABL1,0.1%  (IS) or > = 3 log reduction in BCR-ABL73mNA from the standardized baseline  Complete molecular response no detectable BCR-ABL18mA using a QPCR assay with a sensitivity of at least 4.5 logs below the standardized baseline  I have offered a referral to urology for surgical evaluation for her bladder prolapse but patient refused and stated she wanted to see her gynecologist for nonsurgical management. Offered to get CT abd/pelvis to assess for RLQ pain, patient refused. Patient stated she wanted treatment for her night sweats but did not want to take another pharmaceutical medication. I have advised trying black cohash to see if her night sweats improve.  She will return for follow up in 6 weeks with labs; CBC, CMP, quantitative BCR-ABL1, TSH, EKG.   Orders Placed This Encounter  Procedures  . CBC with Differential    Standing Status:   Future    Standing Expiration Date:   02/14/2018  . Comprehensive metabolic panel     Standing Status:   Future    Standing Expiration Date:   02/14/2018  . BCR-ABL1, CML/ALL, PCR, QUANT  . TSH    Standing Status:   Future    Standing Expiration Date:   02/14/2018  . EKG 12-Lead    Order Specific Question:   Where should this test be performed    Answer:   [facility]   All questions were answered. The patient knows to call the clinic with any problems, questions or concerns. We can certainly see the patient much sooner if necessary.  This document serves as a record of services personally performed by LoTwana FirstMD. It was created on her behalf by JoMartiniqueasey, a trained medical scribe. The creation of this record is based on the scribe's personal observations and the provider's statements to them. This document has been checked and approved by the attending provider.  I have reviewed the above documentation for accuracy and completeness, and I agree with the above.  This note is electronically signed by:  JoMartinique Casey  02/14/2017 3:39 PM

## 2017-02-14 NOTE — Patient Instructions (Signed)
[facility] at [facility] Discharge Instructions  RECOMMENDATIONS MADE BY THE CONSULTANT AND ANY TEST RESULTS WILL BE SENT TO YOUR REFERRING PHYSICIAN.  You were seen today by Dr. Louise Zhou Follow up in 6 weeks with lab work See Amy up front for appointments   Thank you for choosing [facility] at [facility] to provide your oncology and hematology care.  To afford each patient quality time with our provider, please arrive at least 15 minutes before your scheduled appointment time.    If you have a lab appointment with the Cancer Center please come in thru the  Main Entrance and check in at the main information desk  You need to re-schedule your appointment should you arrive 10 or more minutes late.  We strive to give you quality time with our providers, and arriving late affects you and other patients whose appointments are after yours.  Also, if you no show three or more times for appointments you may be dismissed from the clinic at the providers discretion.     Again, thank you for choosing [facility].  Our hope is that these requests will decrease the amount of time that you wait before being seen by our physicians.       _____________________________________________________________  Should you have questions after your visit to [facility], please contact our office at [phone] between the hours of 8:30 a.m. and 4:30 p.m.  Voicemails left after 4:30 p.m. will not be returned until the following business day.  For prescription refill requests, have your pharmacy contact our office.       Resources For Cancer Patients and their Caregivers ? American Cancer Society: Can assist with transportation, wigs, general needs, runs Look Good Feel Better.        1-888-227-6333 ? Cancer Care: Provides financial assistance, online support groups, medication/co-pay assistance.  1-800-813-HOPE (4673) ? [facility] Assists [place] cancer patients and their families through emotional , educational and financial support.  336-427-4357 ? [place] DSS Where to apply for food stamps, Medicaid and utility assistance. 336-342-1394 ? [facility]: Transportation to medical appointments. 336-347-2287 ? Social Security Administration: May apply for disability if have a Stage IV cancer. 336-342-7796 1-800-772-1213 ? [place] Aging, Disability and Transit Services: Assists with nutrition, care and transit needs. 336-349-2343  Cancer Center Support Programs: @10RELATIVEDAYS@ > Cancer Support Group  2nd Tuesday of the month 1pm-2pm, Journey Room  > Creative Journey  3rd Tuesday of the month 1130am-1pm, Journey Room  > Look Good Feel Better  1st Wednesday of the month 10am-12 noon, Journey Room (Call American Cancer Society to register 1-800-395-5775)    

## 2017-02-16 ENCOUNTER — Other Ambulatory Visit (HOSPITAL_COMMUNITY): Payer: Self-pay | Admitting: Adult Health

## 2017-02-16 DIAGNOSIS — D509 Iron deficiency anemia, unspecified: Secondary | ICD-10-CM

## 2017-02-25 ENCOUNTER — Telehealth (HOSPITAL_COMMUNITY): Payer: Self-pay | Admitting: Oncology

## 2017-02-25 ENCOUNTER — Encounter (HOSPITAL_COMMUNITY): Payer: Medicare Other | Attending: Hematology

## 2017-02-25 ENCOUNTER — Encounter (HOSPITAL_COMMUNITY): Payer: Self-pay

## 2017-02-25 VITALS — BP 121/55 | HR 70 | Temp 98.0°F | Resp 18

## 2017-02-25 DIAGNOSIS — D509 Iron deficiency anemia, unspecified: Secondary | ICD-10-CM

## 2017-02-25 MED ORDER — SODIUM CHLORIDE 0.9 % IV SOLN
510.0000 mg | Freq: Once | INTRAVENOUS | Status: AC
  Administered 2017-02-25: 510 mg via INTRAVENOUS
  Filled 2017-02-25: qty 17

## 2017-02-25 MED ORDER — SODIUM CHLORIDE 0.9 % IV SOLN
Freq: Once | INTRAVENOUS | Status: AC
  Administered 2017-02-25: 14:00:00 via INTRAVENOUS

## 2017-02-25 NOTE — Telephone Encounter (Signed)
Per Janett Billow pt needs a letter from cancer care stating they do not have funding for CML. PER CC THEY COULD NOT PROVIDE A LTR BECAUSE THE DO NOT PROVIDE FUNDING FOR CHRONIC MYELOID LEUKEMIA.  CALLED NOVARTIS BACK AND SPOKE TO ELESA. SHE STATED A LETTER FROM US/PROVIDER WOULD BE OK TO SUBMIT.  I WILL FAX OVER A LTR TO [phone]

## 2017-02-25 NOTE — Progress Notes (Signed)
Patient tolerated infusion well.  VSS.  Patient ambulatory and stable upon discharge from clinic.   Patient tolerating chemotherapy pill well.  States that she had to stop it for two weeks prior to a surgery, but has resumed taking it and knows how and when to take it.  She denies any complaints at this time.

## 2017-02-25 NOTE — Patient Instructions (Signed)
[facility] at [facility] Discharge Instructions  RECOMMENDATIONS MADE BY THE CONSULTANT AND ANY TEST RESULTS WILL BE SENT TO YOUR REFERRING PHYSICIAN.  IV iron today.    Thank you for choosing [facility] at [facility] to provide your oncology and hematology care.  To afford each patient quality time with our provider, please arrive at least 15 minutes before your scheduled appointment time.    If you have a lab appointment with the Cancer Center please come in thru the  Main Entrance and check in at the main information desk  You need to re-schedule your appointment should you arrive 10 or more minutes late.  We strive to give you quality time with our providers, and arriving late affects you and other patients whose appointments are after yours.  Also, if you no show three or more times for appointments you may be dismissed from the clinic at the providers discretion.     Again, thank you for choosing [facility].  Our hope is that these requests will decrease the amount of time that you wait before being seen by our physicians.       _____________________________________________________________  Should you have questions after your visit to [facility], please contact our office at [phone] between the hours of 8:30 a.m. and 4:30 p.m.  Voicemails left after 4:30 p.m. will not be returned until the following business day.  For prescription refill requests, have your pharmacy contact our office.       Resources For Cancer Patients and their Caregivers ? American Cancer Society: Can assist with transportation, wigs, general needs, runs Look Good Feel Better.        1-888-227-6333 ? Cancer Care: Provides financial assistance, online support groups, medication/co-pay assistance.  1-800-813-HOPE (4673) ? [facility] Assists [place] cancer patients and their families through emotional  , educational and financial support.  336-427-4357 ? [place] DSS Where to apply for food stamps, Medicaid and utility assistance. 336-342-1394 ? RC[facility]: Transportation to medical appointments. 336-347-2287 ? Social Security Administration: May apply for disability if have a Stage IV cancer. 336-342-7796 1-800-772-1213 ? [place] Aging, Disability and Transit Services: Assists with nutrition, care and transit needs. 336-349-2343  Cancer Center Support Programs: @10RELATIVEDAYS@ > Cancer Support Group  2nd Tuesday of the month 1pm-2pm, Journey Room  > Creative Journey  3rd Tuesday of the month 1130am-1pm, Journey Room  > Look Good Feel Better  1st Wednesday of the month 10am-12 noon, Journey Room (Call American Cancer Society to register 1-800-395-5775)    

## 2017-03-02 ENCOUNTER — Telehealth (HOSPITAL_COMMUNITY): Payer: Self-pay | Admitting: Oncology

## 2017-03-02 NOTE — Telephone Encounter (Signed)
PC TO NOVARTIS SPK WITH [facility]. SHE STATED PT WAS APPROVED FOR ASSISTANCE FOR TASIGNA TIL THE END OF THE YEAR.  CALLED PT AND LEFT A VM ON HER CELL PHONE LETTING HER KNOW

## 2017-03-14 ENCOUNTER — Other Ambulatory Visit (HOSPITAL_COMMUNITY): Payer: Self-pay | Admitting: Oncology

## 2017-03-14 DIAGNOSIS — C9210 Chronic myeloid leukemia, BCR/ABL-positive, not having achieved remission: Secondary | ICD-10-CM

## 2017-03-25 ENCOUNTER — Telehealth (HOSPITAL_COMMUNITY): Payer: Self-pay

## 2017-03-25 NOTE — Telephone Encounter (Signed)
Patient called wanting to be sure she was scheduled for TSH and Iron labs this Tuesday. She is scheduled for TSH but no Iron has been ordered. Explained to patient I would ask the MD and order the Iron if she said it was okay. Patient verbalized understanding. Message sent to MD.

## 2017-03-28 ENCOUNTER — Ambulatory Visit (HOSPITAL_COMMUNITY): Payer: Medicare Other

## 2017-03-28 NOTE — Progress Notes (Addendum)
[place], [place] [address]   CLINIC:  Medical Oncology/Hematology  PCP:  [name], MD [place] [address] [phone]   REASON FOR VISIT:  Follow-up for CML, BCR-ABL1+  CURRENT THERAPY: Tasigna    BRIEF ONCOLOGIC HISTORY:    CML (chronic myeloid leukemia) ([place])   11/21/2015 Initial Diagnosis    Chronic myelogenous leukemia (CML), BCR-ABL1-positive ([place])      11/21/2015 Miscellaneous    Seen by Dr. Florene Glen [facility]      11/21/2015 -  Chemotherapy    Hydrea 1000 mg and Allopurinol 300 mg daily (due to high uric acid level)      11/21/2015 Tumor Marker    BCR P210 Ratio of Fusion to Control, Blood 0.8941    P210 IS % Ratio, Blood 85.834%        01/02/2016 - 10/23/2016 Chemotherapy    Tasigna      01/08/2016 Miscellaneous    EKG- QT/QTc: 382/440 ms      01/16/2016 Miscellaneous    EKG- QT/QTc: 364/419      03/30/2016 Tumor Marker    b2a2 transcript: 0.317 b3a2 transcript: 0.194      10/23/2016 - 11/05/2016 Hospital Admission    large Left Pleural Effusion and Pericardial Effusion that is now s/p VATS with Drainage, and Drainage of Pericardial Fluid with Window and Biopsy of Pericardium      12/06/2016 -  Chemotherapy    Tasigna restarted         INTERVAL HISTORY:  Andrea Martinez, 71 years old presents to the clinic today for follow up of CML. She is accompanied by her husband.    She reports ongoing night sweats. States that she has night sweats 1-2 times a night, as well as during the day. Notes they are drenching night sweats; they are manageable, but are her biggest complaint today.  She denies accompanying hot flashes. The night sweats severely impact her ability to get a good night's rest.   She has progressive fatigue; reports energy levels are at about 50% of her baseline.  Her appetite is okay (about 75% of baseline); she supplements her diet with Boost.    Endorses worsening  arthralgias and myalgias to her bilateral legs. There is associated leg swelling and pain; right leg worse than left. The pain has been constant for about 3-4 days.  Denies any skin changes to the legs including warmth or erythema.  Pain is worse at night when trying to sleep; "my legs just hurt."  States that the pain is "exactly the same as it has been in the past when I needed to go back to [facility] and have shots in my knees."  States that she has a cyst behind her right knee that bothers her at times as well.    Small rash to legs, right chest, and upper back. Areas of erythematous and pruritic. Symptoms managed well with hydrocortisone cream.    s/p IV iron infusion on 02/25/17, which she reportedly tolerated well.  Denies any melena/blood in stools, hematuria, or dysuria. Endorses suprapubic pressure at times with urinary urge incontinence; her gynecologist is helping manage this.    Denies diarrhea, constipation. Endorses dry skin, course/dry hair, and weight gain (our records show stable weight +/- 2-3 lbs for the past 2 months).  Her TSH has been elevated for quite some time; she was reportedly unable to get in with her PCP for hypothyroidism management.  REVIEW OF SYSTEMS:  Review of Systems  Constitutional: Positive for diaphoresis (drenching night sweats 1-2 times a night; also having episodes during the day) and fatigue. Negative for chills and fever.       Denies hot flashes accompanying her night sweats  HENT:  Negative.   Eyes: Negative.   Respiratory: Negative for cough and shortness of breath.   Cardiovascular: Positive for leg swelling. Negative for chest pain and palpitations.  Gastrointestinal: Negative.  Negative for blood in stool, constipation, diarrhea, nausea and vomiting.  Endocrine: Negative.   Genitourinary: Positive for bladder incontinence (urge incontinence at times ). Negative for dysuria, hematuria and vaginal bleeding.        "pressure at times"  (previously declined referral to urology).   Musculoskeletal: Positive for arthralgias and myalgias.  Skin: Positive for itching (3 small areas of pruritic rash) and rash.  Neurological: Negative.  Negative for dizziness and headaches.  Hematological: Negative.   Psychiatric/Behavioral: Positive for sleep disturbance (d/t night sweats).  All other systems reviewed and are negative.    PAST MEDICAL/SURGICAL HISTORY:  Past Medical History:  Diagnosis Date  . Chronic myelogenous leukemia (CML), BCR-ABL1-positive ([place]) 11/20/2015  . Gastric ulcer 2011   EGD, 5/11  . Hypothyroidism    not on meds, followed by Dr. Elyse Hsu  . Leukocytosis 11/20/2015  . Lipoma    left upper arm  . PONV (postoperative nausea and vomiting)   . UTI (lower urinary tract infection)    frequent   Past Surgical History:  Procedure Laterality Date  . BRAVO [place] STUDY  12/04/2012   Procedure: BRAVO [place];  Surgeon: [name], MD;  [facility];  Service: Endoscopy;  Laterality: N/A;  . CHOLECYSTECTOMY N/A 01/03/2014   Procedure: LAPAROSCOPIC CHOLECYSTECTOMY WITH INTRAOPERATIVE CHOLANGIOGRAM;  Surgeon: Joyice Faster. Cornett, MD;  Location: [facility];  Service: General;  Laterality: N/A;  . COLONOSCOPY  01/2004   DR [facility], few small tics  . ESOPHAGOGASTRODUODENOSCOPY  02/2010   gastric ulcers  . ESOPHAGOGASTRODUODENOSCOPY  12/04/2012   ZOX:WRUEAVW gastritis (inflammation) was found in the gastric antrum; multiple biopsies The duodenal mucosa showed no abnormalities in the bulb and second portion of the duodenum  . LIPOMA EXCISION  08/02/2011   left shoulder  . NOSE SURGERY    . PARTIAL HYSTERECTOMY     vaginal at age 9 years of age  . TOE DEBRIDEMENT  1962   lt great toe  . VIDEO ASSISTED THORACOSCOPY (VATS)/EMPYEMA Left 10/25/2016   Procedure: VIDEO ASSISTED THORACOSCOPY (VATS), BRONCH,DRAINAGE OF PLEURAL EFFUSION,PERICARDIAL WINDOW WITH DRAINAGE OF PERICARDIAL FLUID, TEE;   Surgeon: [name], MD;  Location: [facility];  Service: Thoracic;  Laterality: Left;  Marland Kitchen VIDEO BRONCHOSCOPY N/A 10/25/2016   Procedure: VIDEO BRONCHOSCOPY;  Surgeon: [name], MD;  Location: [facility];  Service: Thoracic;  Laterality: N/A;     SOCIAL HISTORY:  Social History   Social History  . Marital status: Married    Spouse name: N/A  . Number of children: 2  . Years of education: 12th grade   Occupational History  . retired [employer]  .  Retired   Social History Main Topics  . Smoking status: Never Smoker  . Smokeless tobacco: Never Used  . Alcohol use No  . Drug use: No  . Sexual activity: Yes    Partners: Male    Birth control/ protection: None   Other Topics Concern  . Not on file   Social History Narrative  .  No narrative on file    FAMILY HISTORY:  Family History  Problem Relation Age of Onset  . Lung cancer Father   . COPD Mother   . Colon cancer Neg Hx   . Anesthesia problems Neg Hx   . Hypotension Neg Hx   . Malignant hyperthermia Neg Hx   . Pseudochol deficiency Neg Hx     CURRENT MEDICATIONS:  Outpatient Encounter Prescriptions as of 03/29/2017  Medication Sig  . cetirizine (ZYRTEC) 10 MG tablet Take 10 mg by mouth daily.   . Cholecalciferol (VITAMIN D-3) 5000 UNITS TABS Take 5,000 Units by mouth daily.    Marland Kitchen escitalopram (LEXAPRO) 20 MG tablet Take 1.5 tablets (30 mg total) by mouth daily.  Marland Kitchen levothyroxine (SYNTHROID) 50 MCG tablet Take 1 tablet (50 mcg total) by mouth daily before breakfast.  . Multiple Vitamins-Minerals (CENTRUM SILVER PO) Take 1 capsule by mouth daily.  . nilotinib (TASIGNA) 150 MG capsule Take 2 capsules (300 mg total) by mouth every 12 (twelve) hours.  . traMADol (ULTRAM) 50 MG tablet TAKE ONE TABLET BY MOUTH EVERY 6 HOURS AS NEEDED  . vitamin B-12 (CYANOCOBALAMIN) 1000 MCG tablet Take 5,000 mcg by mouth daily.   . [DISCONTINUED] aspirin 325 MG tablet Take by mouth.   No facility-administered encounter  medications on file as of 03/29/2017.      ALLERGIES:  Allergies  Allergen Reactions  . Codeine Nausea Only    Headache  . Esomeprazole Magnesium Nausea Only  . Sulfonamide Derivatives Nausea Only    Headache  . Fluconazole Rash    Redness and blistering on left thigh      PHYSICAL EXAM:  ECOG Performance status: 1 - Symptomatic, but independent   Vitals:   03/29/17 1448  BP: 128/60  Pulse: 75  Resp: 16  Temp: 98.2 F (36.8 C)    Filed Weights   03/29/17 1448  Weight: 190 lb 11.2 oz (86.5 kg)     Physical Exam  Constitutional: She is oriented to person, place, and time and well-developed, well-nourished, and in no distress.  HENT:  Head: Normocephalic and atraumatic.  Mouth/Throat: Oropharynx is clear and moist. No oropharyngeal exudate.  Eyes: Conjunctivae are normal. Pupils are equal, round, and reactive to light. No scleral icterus.  Neck: Normal range of motion. Neck supple.  Cardiovascular: Normal rate, regular rhythm and normal heart sounds.   Pulmonary/Chest: Effort normal and breath sounds normal.  Abdominal: Soft. Bowel sounds are normal. She exhibits no distension. There is no tenderness. There is no rebound.  Musculoskeletal: Normal range of motion. She exhibits edema (1-2+ BLE edema ).  Lymphadenopathy:    She has no cervical adenopathy.       Right: No supraclavicular adenopathy present.       Left: No supraclavicular adenopathy present.  Neurological: She is alert and oriented to person, place, and time. No cranial nerve deficit. Gait normal.  Skin: Skin is warm and dry. Rash (Unable to visualize reported rash to leg as patient has on pants and stockings; rash to right upper chest is erythematous and small. ) noted.  Psychiatric: Memory and judgment normal.  Mildly anxious mood & affect   Nursing note and vitals reviewed.    LABORATORY DATA:  I have reviewed the labs as listed.  CBC    Component Value Date/Time   WBC 7.5 03/29/2017 1311    RBC 4.54 03/29/2017 1311   HGB 13.0 03/29/2017 1311   HCT 39.8 03/29/2017 1311   PLT 254 03/29/2017  1311   MCV 87.7 03/29/2017 1311   MCH 28.6 03/29/2017 1311   MCHC 32.7 03/29/2017 1311   RDW 15.6 (H) 03/29/2017 1311   LYMPHSABS 1.6 03/29/2017 1311   MONOABS 0.4 03/29/2017 1311   EOSABS 0.2 03/29/2017 1311   BASOSABS 0.0 03/29/2017 1311   CMP Latest Ref Rng & Units 03/29/2017 02/14/2017 01/17/2017  Glucose 65 - 99 mg/dL 124(H) 99 127(H)  BUN 6 - 20 mg/dL 20 23(H) 23(H)  Creatinine 0.44 - 1.00 mg/dL 0.88 0.79 0.79  Sodium 135 - 145 mmol/L 140 137 136  Potassium 3.5 - 5.1 mmol/L 4.3 3.9 4.0  Chloride 101 - 111 mmol/L 105 103 101  CO2 22 - 32 mmol/L 27 26 26   Calcium 8.9 - 10.3 mg/dL 9.4 9.1 9.1  Total Protein 6.5 - 8.1 g/dL 7.4 7.6 7.7  Total Bilirubin 0.3 - 1.2 mg/dL 0.8 0.8 0.8  Alkaline Phos 38 - 126 U/L 157(H) 157(H) 158(H)  AST 15 - 41 U/L 40 41 35  ALT 14 - 54 U/L 32 29 22      PENDING LABS:  BCR-ABL pending     DIAGNOSTIC IMAGING:  I have reviewed & agree with the reported results. CT chest: 10/23/16    Chest X-ray: 12/31/16      PATHOLOGY:     EKG:  Most recent EKG: 02/14/17       ASSESSMENT & PLAN:  Ms. Vantassel is a pleasant 71 y.o. female with CML, BCR-ABL1+; started on Tasigna on 01/02/16. Treatment course complicated by left pleural effusion & pericardial effusion requiring pericardial window in 10/2016. Tasigna restarted on 12/06/16.    CML, BCR-ABL1+: -Lab results reviewed with patient; they are largely stable, with the exception of her TSH (see below).  -Remains on Tasigna with good tolerance.  BCR-ABL pending.  -Last EKG 02/14/17 without QTc prolongation.  -Return to cancer center in 8 weeks with lab and follow-up visit. Will get EKG at that time as well for continued drug monitoring.  Of note, she is also on Lexapro, which when taken in combination with Tasigna can further increase risk of QTc prolongation; we will certainly continue to closely  monitor.    Per Dr. Laverle Patter last note 02/14/17:     Hypothyroidism:  -TSH is markedly elevated today at remains elevated at 23.244. Last TSH was elevated as well at 10.310 on 01/17/17. She was unable to get in to see her PCP since that time.  While Tasigna has no known associated endocrinopathies, I will correct her hypothyroidism as it is now severe.   -Start Synthroid 50 mcg/day; flat dose selected given patient's age. She understands that she needs to take this medication on an empty stomach, about 1 hour before breakfast every morning.  [facility]  -She understands that it may take frequent blood monitoring to get her TSH to therapeutic level on Synthroid.  Return for labs only in 4 weeks to recheck TSH; will make dose-adjustments to Synthroid, if appropriate, at that time.   -After we have optimized her Synthroid, I will ask that her PCP assume responsibility for her hypothyroidism going forward.  She agrees with this plan.  I will fax a copy of my note to her PCP today as well.    Night sweats: -Likely secondary to disease process and treatment; night sweats reported to occur 12-27% of the time for patients on Tasigna according to UpToDate.  -Recommended conservative management with OTC Vitamin E at bedtime.  If this is ineffective, then we  could consider gabapentin 300 mg QHS.  Black cohosh is an option as well, but expressed my concern regarding this supplement as it has known interactions with some medications and has increased risk of elevated liver enzymes.  Of note, I checked LexiComp drug interactions and there is no interaction between black cohosh and Tasigna. She would like to start with Vitamin E, which is certainly reasonable.  Benefits of gabapentin would be helping her sleep, improving hot flashes/night sweats, and possibly improving her arthralgias to a degree as well.     Pleural effusion/Pericardial effusion:  -Resolved s/p pericardial window in 10/2016; followed by Dr.  Roxan Hockey with Cardio-Thoracic Surgery.  Most recent CT chest done 01/18/17.    Rash:  -Grade 1; likely secondary to Tasigna. Well-controlled with hydrocortisone cream. Continue as needed. Will continue to monitor.    BLE pain/swelling:  -Chronic orthopedic issues to bilateral knees; there is associated bilateral lower extremity edema on exam. Recommended we get ultrasound of bilateral LE to r/o DVT; she declines today. She understands that her malignancy puts her at increased risk of blood clot, but she continues to decline ultrasound.  Will continue to monitor. Encouraged her to follow-up with orthopedics as directed.    Iron deficiency anemia:  -Required dose of IV iron on 02/25/17; tolerated well.  Ferritin pending for today. We will contact patient and make arrangements for subsequent infusions, if needed.  -Will recheck iron studies in 8 weeks at follow-up visit.  Oncology Flowsheet 02/25/2017  ferumoxytol (FERAHEME) IV 510 mg       Dispo:  -Labs only in 4 weeks (recheck TSH).  -Return to cancer center in 8 weeks for follow-up visit, labs, and EKG.       All questions were answered to patient's stated satisfaction. Encouraged patient to call with any new concerns or questions before her next visit to the cancer center and we can certain see her sooner, if needed.     I have reviewed the above documentation for accuracy and completeness and I agree with the above.    Orders placed this encounter:  Orders Placed This Encounter  Procedures  . TSH  . CBC with Differential/Platelet  . Comprehensive metabolic panel  . TSH  . BCR-ABL1, CML/ALL, PCR, QUANT  . Ferritin  . Iron and TIBC    Mike Craze, NP [place] [phone]

## 2017-03-29 ENCOUNTER — Encounter (HOSPITAL_COMMUNITY): Payer: Self-pay | Admitting: Adult Health

## 2017-03-29 ENCOUNTER — Encounter (HOSPITAL_COMMUNITY): Payer: Medicare Other

## 2017-03-29 ENCOUNTER — Encounter (HOSPITAL_COMMUNITY): Payer: Medicare Other | Attending: Adult Health | Admitting: Adult Health

## 2017-03-29 ENCOUNTER — Other Ambulatory Visit (HOSPITAL_COMMUNITY): Payer: Self-pay

## 2017-03-29 VITALS — BP 128/60 | HR 75 | Temp 98.2°F | Resp 16 | Ht 65.0 in | Wt 190.7 lb

## 2017-03-29 DIAGNOSIS — C9210 Chronic myeloid leukemia, BCR/ABL-positive, not having achieved remission: Secondary | ICD-10-CM

## 2017-03-29 DIAGNOSIS — D509 Iron deficiency anemia, unspecified: Secondary | ICD-10-CM | POA: Insufficient documentation

## 2017-03-29 DIAGNOSIS — D649 Anemia, unspecified: Secondary | ICD-10-CM

## 2017-03-29 DIAGNOSIS — E039 Hypothyroidism, unspecified: Secondary | ICD-10-CM | POA: Diagnosis not present

## 2017-03-29 DIAGNOSIS — M7989 Other specified soft tissue disorders: Secondary | ICD-10-CM

## 2017-03-29 LAB — CBC WITH DIFFERENTIAL/PLATELET
BASOS ABS: 0 10*3/uL (ref 0.0–0.1)
BASOS PCT: 0 %
Eosinophils Absolute: 0.2 10*3/uL (ref 0.0–0.7)
Eosinophils Relative: 2 %
HEMATOCRIT: 39.8 % (ref 36.0–46.0)
Hemoglobin: 13 g/dL (ref 12.0–15.0)
Lymphocytes Relative: 21 %
Lymphs Abs: 1.6 10*3/uL (ref 0.7–4.0)
MCH: 28.6 pg (ref 26.0–34.0)
MCHC: 32.7 g/dL (ref 30.0–36.0)
MCV: 87.7 fL (ref 78.0–100.0)
MONO ABS: 0.4 10*3/uL (ref 0.1–1.0)
Monocytes Relative: 5 %
NEUTROS ABS: 5.3 10*3/uL (ref 1.7–7.7)
Neutrophils Relative %: 72 %
PLATELETS: 254 10*3/uL (ref 150–400)
RBC: 4.54 MIL/uL (ref 3.87–5.11)
RDW: 15.6 % — AB (ref 11.5–15.5)
WBC: 7.5 10*3/uL (ref 4.0–10.5)

## 2017-03-29 LAB — COMPREHENSIVE METABOLIC PANEL
ALBUMIN: 4.1 g/dL (ref 3.5–5.0)
ALT: 32 U/L (ref 14–54)
AST: 40 U/L (ref 15–41)
Alkaline Phosphatase: 157 U/L — ABNORMAL HIGH (ref 38–126)
Anion gap: 8 (ref 5–15)
BUN: 20 mg/dL (ref 6–20)
CHLORIDE: 105 mmol/L (ref 101–111)
CO2: 27 mmol/L (ref 22–32)
Calcium: 9.4 mg/dL (ref 8.9–10.3)
Creatinine, Ser: 0.88 mg/dL (ref 0.44–1.00)
GFR calc Af Amer: >60 mL/min (ref >60)
GFR calc non Af Amer: >60 mL/min (ref >60)
Glucose, Bld: 124 mg/dL — ABNORMAL HIGH (ref 65–99)
POTASSIUM: 4.3 mmol/L (ref 3.5–5.1)
Sodium: 140 mmol/L (ref 135–145)
Total Bilirubin: 0.8 mg/dL (ref 0.3–1.2)
Total Protein: 7.4 g/dL (ref 6.5–8.1)

## 2017-03-29 LAB — IRON AND TIBC
IRON: 43 ug/dL (ref 28–170)
Saturation Ratios: 15 % (ref 10.4–31.8)
TIBC: 294 ug/dL (ref 250–450)
UIBC: 251 ug/dL

## 2017-03-29 LAB — TSH: TSH: 23.244 u[IU]/mL — AB (ref 0.350–4.500)

## 2017-03-29 LAB — FERRITIN: Ferritin: 189 ng/mL (ref 11–307)

## 2017-03-29 MED ORDER — LEVOTHYROXINE SODIUM 50 MCG PO TABS: 50.0000 ug | ORAL_TABLET | Freq: Every day | ORAL | 0 refills | Status: DC

## 2017-03-29 NOTE — Patient Instructions (Addendum)
[place] at [facility] Discharge Instructions  RECOMMENDATIONS MADE BY THE CONSULTANT AND ANY TEST RESULTS WILL BE SENT TO YOUR REFERRING PHYSICIAN.  You were seen today by Mike Craze NP. Synthroid given today. Synthroid dose is set right now, Take one tablet one hour before breakfast. We will recheck labs in 4 weeks. Return in 8 weeks for labs, follow up and EKG.    Thank you for choosing [place] at [facility] to provide your oncology and hematology care.  To afford each patient quality time with our provider, please arrive at least 15 minutes before your scheduled appointment time.    If you have a lab appointment with the [facility] please come in thru the  Main Entrance and check in at the main information desk  You need to re-schedule your appointment should you arrive 10 or more minutes late.  We strive to give you quality time with our providers, and arriving late affects you and other patients whose appointments are after yours.  Also, if you no show three or more times for appointments you may be dismissed from the clinic at the providers discretion.     Again, thank you for choosing [facility].  Our hope is that these requests will decrease the amount of time that you wait before being seen by our physicians.       _____________________________________________________________  Should you have questions after your visit to [facility], please contact our office at [phone] between the hours of 8:30 a.m. and 4:30 p.m.  Voicemails left after 4:30 p.m. will not be returned until the following business day.  For prescription refill requests, have your pharmacy contact our office.       Resources For Cancer Patients and their Caregivers ? American Cancer Society: Can assist with transportation, wigs, general needs, runs Look Good Feel Better.        (847) 380-0796 ? Cancer Care: Provides  financial assistance, online support groups, medication/co-pay assistance.  1-800-813-HOPE (719)417-3693) ? [place] Assists [place] cancer patients and their families through emotional , educational and financial support.  541-013-2342 ? [place] DSS Where to apply for food stamps, Medicaid and utility assistance. 250 105 3485 ? [facility]: Transportation to medical appointments. 726 227 4101 ? Social Security Administration: May apply for disability if have a Stage IV cancer. 820 629 6117 930-724-6788 ? [employer], Disability and Transit Services: Assists with nutrition, care and transit needs. [place] Support Programs: @10RELATIVEDAYS @ > Cancer Support Group  2nd Tuesday of the month 1pm-2pm, Journey Room  > Creative Journey  3rd Tuesday of the month 1130am-1pm, Journey Room  > Look Good Feel Better  1st Wednesday of the month 10am-12 noon, Journey Room (Call [place] to register [phone])

## 2017-03-29 NOTE — Addendum Note (Signed)
Addended by: Holley Bouche on: 03/29/2017 05:08 PM   Modules accepted: Orders

## 2017-03-29 NOTE — Telephone Encounter (Signed)
You can add iron labs

## 2017-04-01 LAB — BCR-ABL1, CML/ALL, PCR, QUANT
B3A2 TRANSCRIPT: 0.1004 %
b2a2 transcript: 0.1255 %

## 2017-04-25 ENCOUNTER — Telehealth (HOSPITAL_COMMUNITY): Payer: Self-pay | Admitting: *Deleted

## 2017-04-26 ENCOUNTER — Encounter (HOSPITAL_COMMUNITY): Payer: Medicare Other | Attending: Oncology

## 2017-04-26 ENCOUNTER — Telehealth (HOSPITAL_COMMUNITY): Payer: Self-pay | Admitting: *Deleted

## 2017-04-26 ENCOUNTER — Other Ambulatory Visit (HOSPITAL_COMMUNITY): Payer: Self-pay | Admitting: Adult Health

## 2017-04-26 ENCOUNTER — Telehealth (HOSPITAL_COMMUNITY): Payer: Self-pay | Admitting: Adult Health

## 2017-04-26 DIAGNOSIS — C9210 Chronic myeloid leukemia, BCR/ABL-positive, not having achieved remission: Secondary | ICD-10-CM | POA: Insufficient documentation

## 2017-04-26 DIAGNOSIS — E039 Hypothyroidism, unspecified: Secondary | ICD-10-CM

## 2017-04-26 LAB — TSH: TSH: 40.527 u[IU]/mL — ABNORMAL HIGH (ref 0.350–4.500)

## 2017-04-26 MED ORDER — LEVOTHYROXINE SODIUM 75 MCG PO TABS: 75.0000 ug | ORAL_TABLET | Freq: Every day | ORAL | 0 refills | Status: DC

## 2017-04-26 NOTE — Telephone Encounter (Signed)
Spoke with the pt about taking her Synthroid. Pt states that she takes her synthroid  Every morning on an empty stomach with a whole glass of water and then waits an hour before she eats. I advised the pt of her lab results and that we would be sending in an increased dose of synthroid to her pharmacy. I advised the pt that I would also call Dr. Luan Pulling and get her in to be seen there so that he can follow up with her thyroid treatment. The pt verbalized understanding.

## 2017-04-26 NOTE — Telephone Encounter (Signed)
I called to provide updates with Dr. Luan Pulling re: Ms. Behan continued elevation in TSH.   Verified from patient today that she is indeed taking the current prescribed dose of Synthroid 50 mcg in the AM on an empty stomach.  TSH markedly elevated at 40.527 (up from 23.244 on 03/29/17).  Will increase her Synthroid to 75 mcg; [facility]  Urgent referral to Dr. Dorris Fetch with endocrinology placed today.   Discussed these results & our recommendations with Dr. Luan Pulling. He agrees with the above plan.    Mike Craze, NP [place] [phone]

## 2017-05-12 ENCOUNTER — Ambulatory Visit (INDEPENDENT_AMBULATORY_CARE_PROVIDER_SITE_OTHER): Payer: Medicare Other | Admitting: "Endocrinology

## 2017-05-12 ENCOUNTER — Encounter: Payer: Self-pay | Admitting: "Endocrinology

## 2017-05-12 VITALS — BP 127/76 | HR 76 | Ht 65.0 in | Wt 198.0 lb

## 2017-05-12 DIAGNOSIS — E039 Hypothyroidism, unspecified: Secondary | ICD-10-CM | POA: Diagnosis not present

## 2017-05-12 MED ORDER — LEVOTHYROXINE SODIUM 100 MCG PO TABS: 100.0000 ug | ORAL_TABLET | Freq: Every day | ORAL | 2 refills | Status: DC

## 2017-05-12 NOTE — Progress Notes (Signed)
Subjective:    Patient ID: Andrea Martinez, female    DOB: 1946/10/27, PCP Sinda Du, MD   Past Medical History:  Diagnosis Date  . Chronic myelogenous leukemia (CML), BCR-ABL1-positive ([place]) 11/20/2015  . Gastric ulcer 2011   EGD, 5/11  . Hypothyroidism    not on meds, followed by Dr. Elyse Hsu  . Leukocytosis 11/20/2015  . Lipoma    left upper arm  . PONV (postoperative nausea and vomiting)   . UTI (lower urinary tract infection)    frequent   Past Surgical History:  Procedure Laterality Date  . BRAVO [place] STUDY  12/04/2012   Procedure: BRAVO [place];  Surgeon: [name], MD;  [facility];  Service: Endoscopy;  Laterality: N/A;  . CHOLECYSTECTOMY N/A 01/03/2014   Procedure: LAPAROSCOPIC CHOLECYSTECTOMY WITH INTRAOPERATIVE CHOLANGIOGRAM;  Surgeon: Joyice Faster. Cornett, MD;  Location: [place];  Service: General;  Laterality: N/A;  . COLONOSCOPY  01/2004   DR [facility], few small tics  . ESOPHAGOGASTRODUODENOSCOPY  02/2010   gastric ulcers  . ESOPHAGOGASTRODUODENOSCOPY  12/04/2012   FHL:[id number] gastritis (inflammation) was found in the gastric antrum; multiple biopsies The duodenal mucosa showed no abnormalities in the bulb and second portion of the duodenum  . LIPOMA EXCISION  08/02/2011   left shoulder  . NOSE SURGERY    . PARTIAL HYSTERECTOMY     vaginal at age 42 years of age  . TOE DEBRIDEMENT  1962   lt great toe  . VIDEO ASSISTED THORACOSCOPY (VATS)/EMPYEMA Left 10/25/2016   Procedure: VIDEO ASSISTED THORACOSCOPY (VATS), BRONCH,DRAINAGE OF PLEURAL EFFUSION,PERICARDIAL WINDOW WITH DRAINAGE OF PERICARDIAL FLUID, TEE;  Surgeon: [name], MD;  Location: [place];  Service: Thoracic;  Laterality: Left;  Marland Kitchen VIDEO BRONCHOSCOPY N/A 10/25/2016   Procedure: VIDEO BRONCHOSCOPY;  Surgeon: [name], MD;  Location: [place];  Service: Thoracic;  Laterality: N/A;   Social History   Social History  . Marital status: Married     Spouse name: N/A  . Number of children: 2  . Years of education: 12th grade   Occupational History  . retired [employer]  .  Retired   Social History Main Topics  . Smoking status: Never Smoker  . Smokeless tobacco: Never Used  . Alcohol use No  . Drug use: No  . Sexual activity: Yes    Partners: Male    Birth control/ protection: None   Other Topics Concern  . None   Social History Narrative  . None   Outpatient Encounter Prescriptions as of 05/12/2017  Medication Sig  . cetirizine (ZYRTEC) 10 MG tablet Take 10 mg by mouth daily.   . Cholecalciferol (VITAMIN D-3) 5000 UNITS TABS Take 5,000 Units by mouth daily.    Marland Kitchen escitalopram (LEXAPRO) 20 MG tablet Take 1.5 tablets (30 mg total) by mouth daily.  Marland Kitchen levothyroxine (SYNTHROID, LEVOTHROID) 100 MCG tablet Take 1 tablet (100 mcg total) by mouth daily before breakfast.  . Multiple Vitamins-Minerals (CENTRUM SILVER PO) Take 1 capsule by mouth daily.  . nilotinib (TASIGNA) 150 MG capsule Take 2 capsules (300 mg total) by mouth every 12 (twelve) hours.  . traMADol (ULTRAM) 50 MG tablet TAKE ONE TABLET BY MOUTH EVERY 6 HOURS AS NEEDED  . vitamin B-12 (CYANOCOBALAMIN) 1000 MCG tablet Take 5,000 mcg by mouth daily.   . [DISCONTINUED] levothyroxine (SYNTHROID) 75 MCG tablet Take 1 tablet (75 mcg total) by mouth daily before breakfast.   No facility-administered encounter medications on file  as of 05/12/2017.    ALLERGIES: Allergies  Allergen Reactions  . Codeine Nausea Only    Headache  . Esomeprazole Magnesium Nausea Only  . Sulfonamide Derivatives Nausea Only    Headache  . Fluconazole Rash    Redness and blistering on left thigh    VACCINATION STATUS: Immunization History  Administered Date(s) Administered  . Influenza-Unspecified 09/24/2015    HPI 71 year old female patient with medical history as above. She is being seen in consultation for hypothyroidism requested by Sinda Du, M.D. -She says she was told about  hypothyroidism for at least 8 years however did not require thyroid hormone replacement until 2 months ago when she was started on 50 g of levothyroxine which was since increased to 75 g due to increasing TSH. -She complains of progressive weight gain, fatigue, sweating.  she is on medications for CML- with nilotinib. -She has family history of thyroid problem involving goiter which required thyroidectomy in her mother who took thyroid hormone replacement subsequently. -She denies dysphagia, shortness of breath, voice change. -She is compliant to her medications. She is taking  Review of Systems  Constitutional: + weight gain, + fatigue, + subjective hyperthermia.  Eyes: no blurry vision, no xerophthalmia ENT: no sore throat, no nodules palpated in throat, no dysphagia/odynophagia, no hoarseness Cardiovascular: no Chest Pain, no Shortness of Breath, no palpitations, no leg swelling Respiratory: no cough, no SOB Gastrointestinal: no Nausea/Vomiting/Diarhhea Musculoskeletal: no muscle/joint aches Skin: no rashes Neurological: + tremors, no numbness, no tingling, no dizziness Psychiatric: no depression, no anxiety  Objective:    BP 127/76   Pulse 76   Ht 5\' 5"  (1.651 m)   Wt 198 lb (89.8 kg)   BMI 32.95 kg/m   Wt Readings from Last 3 Encounters:  05/12/17 198 lb (89.8 kg)  03/29/17 190 lb 11.2 oz (86.5 kg)  02/14/17 192 lb 8 oz (87.3 kg)    Physical Exam  Constitutional:  Obese,  not in acute distress, normal state of mind Eyes: PERRLA, EOMI, no exophthalmos ENT: moist mucous membranes, no thyromegaly, no cervical lymphadenopathy Cardiovascular: normal precordial activity, Regular Rate and Rhythm, no Murmur/Rubs/Gallops Respiratory:  adequate breathing efforts, no gross chest deformity, Clear to auscultation bilaterally Gastrointestinal: abdomen soft, Non -tender, No distension, Bowel Sounds present Musculoskeletal: no gross deformities, strength intact in all four  extremities Skin: moist, warm, no rashes Neurological: + Mild tremor with outstretched hands, Deep tendon reflexes normal in all four extremities.  CMP ( most recent) CMP     Component Value Date/Time   NA 140 03/29/2017 1311   K 4.3 03/29/2017 1311   CL 105 03/29/2017 1311   CO2 27 03/29/2017 1311   GLUCOSE 124 (H) 03/29/2017 1311   BUN 20 03/29/2017 1311   CREATININE 0.88 03/29/2017 1311   CREATININE 0.87 07/14/2011 0938   CALCIUM 9.4 03/29/2017 1311   PROT 7.4 03/29/2017 1311   ALBUMIN 4.1 03/29/2017 1311   AST 40 03/29/2017 1311   ALT 32 03/29/2017 1311   ALKPHOS 157 (H) 03/29/2017 1311   BILITOT 0.8 03/29/2017 1311   GFRNONAA >60 03/29/2017 1311   GFRAA >60 03/29/2017 1311     Diabetic Labs (most recent): Lab Results  Component Value Date   HGBA1C 5.3 10/28/2016   Results for DYNESHIA, BACCAM (MRN [id number]) as of 05/12/2017 14:41  Ref. Range 10/30/2016 05:31 12/31/2016 13:14 01/17/2017 08:34 03/29/2017 13:11 04/26/2017 10:41  TSH Latest Ref Range: 0.350 - 4.500 uIU/mL  8.119 (H) 10.310 (H) 23.244 (H) 40.527 (H)  T4,Free(Direct) Latest Ref Range: 0.61 - 1.12 ng/dL 0.83          Assessment & Plan:   1. Hypothyroidism, unspecified type - I have reviewed her available thyroid records and clinically evaluated this patient. She seems to have above target and TSH for the last 4 measurements since January 2018. - Based on her latest TSH of 40.5, she will benefit from a higher dose of levothyroxine.   I discussed and increased her levothyroxine to 100 g by mouth every morning.  It is likely that she will require higher dose based on subsequent measurements of TSH and free T4.  - We discussed about correct intake of levothyroxine, at fasting, with water, separated by at least 30 minutes from breakfast, and separated by more than 4 hours from calcium, iron, multivitamins, acid reflux medications (PPIs). -Patient is made aware of the fact that thyroid hormone replacement is  needed for life, dose to be adjusted by periodic monitoring of thyroid function tests.  - Her clinical exam is negative for goiter, hence no need for imaging of the thyroid at this time.   - I advised patient to maintain close follow up with Sinda Du, MD for primary care needs. Follow up plan: Return in about 8 weeks (around 07/07/2017) for follow up with pre-visit labs.  Glade Lloyd, MD Phone: [phone]  Fax: [phone]   05/12/2017, 2:57 PM

## 2017-05-17 ENCOUNTER — Other Ambulatory Visit (HOSPITAL_COMMUNITY): Payer: Self-pay | Admitting: Oncology

## 2017-05-17 DIAGNOSIS — C9210 Chronic myeloid leukemia, BCR/ABL-positive, not having achieved remission: Secondary | ICD-10-CM

## 2017-05-20 ENCOUNTER — Other Ambulatory Visit (HOSPITAL_COMMUNITY): Payer: Self-pay

## 2017-05-20 ENCOUNTER — Encounter (HOSPITAL_COMMUNITY): Payer: Self-pay | Admitting: Adult Health

## 2017-05-20 ENCOUNTER — Other Ambulatory Visit (HOSPITAL_COMMUNITY): Payer: Self-pay | Admitting: Adult Health

## 2017-05-20 DIAGNOSIS — C9210 Chronic myeloid leukemia, BCR/ABL-positive, not having achieved remission: Secondary | ICD-10-CM

## 2017-05-20 MED ORDER — TRAMADOL HCL 50 MG PO TABS: 50.0000 mg | ORAL_TABLET | Freq: Four times a day (QID) | ORAL | 0 refills | Status: DC | PRN

## 2017-05-20 NOTE — Telephone Encounter (Signed)
Patients pharmacy called because they said they had sent a fax request for refill for tramadol for patient several days ago and had not received a response. Reviewed with provider, chart checked and refilled.

## 2017-05-20 NOTE — Progress Notes (Signed)
Patient called cancer center requesting refill of Tramadol.   [place] Controlled Substance Reporting System reviewed and refill is appropriate on or after 05/20/17. Paper prescription printed & post-dated; Rx given to Jaynie Collins, LPN.   [place]CCS[place] reviewed:     Mike Craze, NP [place] [phone]

## 2017-05-24 ENCOUNTER — Encounter (HOSPITAL_COMMUNITY): Payer: Medicare Other | Attending: Hematology & Oncology

## 2017-05-24 ENCOUNTER — Encounter (HOSPITAL_COMMUNITY): Payer: Medicare Other | Attending: Oncology | Admitting: Oncology

## 2017-05-24 ENCOUNTER — Other Ambulatory Visit: Payer: Self-pay

## 2017-05-24 ENCOUNTER — Encounter (HOSPITAL_COMMUNITY): Payer: Self-pay | Admitting: Oncology

## 2017-05-24 VITALS — BP 141/56 | HR 75 | Temp 98.6°F | Resp 16 | Ht 65.5 in | Wt 202.5 lb

## 2017-05-24 DIAGNOSIS — E039 Hypothyroidism, unspecified: Secondary | ICD-10-CM

## 2017-05-24 DIAGNOSIS — D509 Iron deficiency anemia, unspecified: Secondary | ICD-10-CM | POA: Diagnosis not present

## 2017-05-24 DIAGNOSIS — C9210 Chronic myeloid leukemia, BCR/ABL-positive, not having achieved remission: Secondary | ICD-10-CM | POA: Insufficient documentation

## 2017-05-24 DIAGNOSIS — Z5181 Encounter for therapeutic drug level monitoring: Secondary | ICD-10-CM

## 2017-05-24 LAB — T4, FREE: FREE T4: 0.98 ng/dL (ref 0.61–1.12)

## 2017-05-24 LAB — CBC WITH DIFFERENTIAL/PLATELET
Basophils Absolute: 0 10*3/uL (ref 0.0–0.1)
Basophils Relative: 0 %
EOS ABS: 0.2 10*3/uL (ref 0.0–0.7)
Eosinophils Relative: 3 %
HEMATOCRIT: 38.2 % (ref 36.0–46.0)
HEMOGLOBIN: 12.3 g/dL (ref 12.0–15.0)
LYMPHS ABS: 1.5 10*3/uL (ref 0.7–4.0)
Lymphocytes Relative: 18 %
MCH: 29.2 pg (ref 26.0–34.0)
MCHC: 32.2 g/dL (ref 30.0–36.0)
MCV: 90.7 fL (ref 78.0–100.0)
Monocytes Absolute: 0.6 10*3/uL (ref 0.1–1.0)
Monocytes Relative: 7 %
NEUTROS ABS: 5.9 10*3/uL (ref 1.7–7.7)
NEUTROS PCT: 72 %
Platelets: 243 10*3/uL (ref 150–400)
RBC: 4.21 MIL/uL (ref 3.87–5.11)
RDW: 14.5 % (ref 11.5–15.5)
WBC: 8.2 10*3/uL (ref 4.0–10.5)

## 2017-05-24 LAB — COMPREHENSIVE METABOLIC PANEL
ALT: 30 U/L (ref 14–54)
ANION GAP: 10 (ref 5–15)
AST: 35 U/L (ref 15–41)
Albumin: 4 g/dL (ref 3.5–5.0)
Alkaline Phosphatase: 163 U/L — ABNORMAL HIGH (ref 38–126)
BILIRUBIN TOTAL: 1 mg/dL (ref 0.3–1.2)
BUN: 21 mg/dL — ABNORMAL HIGH (ref 6–20)
CALCIUM: 9.1 mg/dL (ref 8.9–10.3)
CO2: 28 mmol/L (ref 22–32)
Chloride: 101 mmol/L (ref 101–111)
Creatinine, Ser: 0.86 mg/dL (ref 0.44–1.00)
Glucose, Bld: 123 mg/dL — ABNORMAL HIGH (ref 65–99)
Potassium: 4.1 mmol/L (ref 3.5–5.1)
Sodium: 139 mmol/L (ref 135–145)
TOTAL PROTEIN: 7.3 g/dL (ref 6.5–8.1)

## 2017-05-24 LAB — IRON AND TIBC
Iron: 39 ug/dL (ref 28–170)
Saturation Ratios: 12 % (ref 10.4–31.8)
TIBC: 316 ug/dL (ref 250–450)
UIBC: 277 ug/dL

## 2017-05-24 LAB — TSH: TSH: 17.664 u[IU]/mL — AB (ref 0.350–4.500)

## 2017-05-24 LAB — FERRITIN: Ferritin: 144 ng/mL (ref 11–307)

## 2017-05-24 NOTE — Progress Notes (Signed)
[place] at [facility] Hematology/Oncology Progress Note  Name: Andrea Martinez      MRN: [id number]   Date: 05/24/2017 Time:2:59 PM   REFERRING PHYSICIAN:  Sinda Du, MD   DIAGNOSIS:  CML    CML (chronic myeloid leukemia) ([place])   11/21/2015 Initial Diagnosis    Chronic myelogenous leukemia (CML), BCR-ABL1-positive ([place])      11/21/2015 Miscellaneous    Seen by Dr. Florene Glen [facility]      11/21/2015 -  Chemotherapy    Hydrea 1000 mg and Allopurinol 300 mg daily (due to high uric acid level)      11/21/2015 Tumor Marker    BCR P210 Ratio of Fusion to Control, Blood 0.8941    P210 IS % Ratio, Blood 85.834%        01/02/2016 - 10/23/2016 Chemotherapy    Tasigna      01/08/2016 Miscellaneous    EKG- QT/QTc: 382/440 ms      01/16/2016 Miscellaneous    EKG- QT/QTc: 364/419      03/30/2016 Tumor Marker    b2a2 transcript: 0.317 b3a2 transcript: 0.194      10/23/2016 - 11/05/2016 Hospital Admission    large Left Pleural Effusion and Pericardial Effusion that is now s/p VATS with Drainage, and Drainage of Pericardial Fluid with Window and Biopsy of Pericardium      12/06/2016 -  Chemotherapy    Tasigna restarted        HISTORY OF PRESENT ILLNESS:   Andrea Martinez is a 71 y.o. white American female here for additional f/u of CML. She was seen in consultation with Dr. Florene Glen.   She was admitted from 10/23/2016 through 11/05/2016 with large Left Pleural Effusion and Pericardial Effusion that is now s/p VATS with Drainage, and Drainage of Pericardial Fluid with Window and Biopsy of Pericardium with Dr. Roxan Hockey. Tasigna was held during her hospitalization and through follow-up with me until she was seen by Dr. Florene Glen at [facility].   She has had no problems with the Tasigna. She has been having a lot of night sweats since her Tasigna was stopped when she was in the hospital. Even after restarting it, the night sweats are still present. This is causing a lot  of fatigue from lack of sleep. She says that her bladder has dropped, causing some incontinence. She has leg swelling, which is normal for her. Intermittent right lower quadrant abdominal pain. Denies SOB, chest pain, or any other concerns.  May 24, 2017 Patient is here for ongoing evaluation regarding chronic myelogenous leukemia  Patient has seen an endocrinologist for management of hypothyroidism.  Her TSH continues to be high at 17 (TSH was 40 prior to it just being thyroid medication) Hemoglobin is improved after intravenous iron infusion therapy No chest pain shortness of breath or any other complaint  PAST MEDICAL HISTORY:   Past Medical History:  Diagnosis Date  . Chronic myelogenous leukemia (CML), BCR-ABL1-positive ([place]) 11/20/2015  . Gastric ulcer 2011   EGD, 5/11  . Hypothyroidism    not on meds, followed by Dr. Elyse Hsu  . Leukocytosis 11/20/2015  . Lipoma    left upper arm  . PONV (postoperative nausea and vomiting)   . UTI (lower urinary tract infection)    frequent    ALLERGIES: Allergies  Allergen Reactions  . Codeine Nausea Only    Headache  . Esomeprazole Magnesium Nausea Only  . Sulfonamide Derivatives Nausea Only    Headache  .  Fluconazole Rash    Redness and blistering on left thigh      MEDICATIONS: I have reviewed the patient's current medications.    Current Outpatient Prescriptions on File Prior to Visit  Medication Sig Dispense Refill  . cetirizine (ZYRTEC) 10 MG tablet Take 10 mg by mouth daily.     . Cholecalciferol (VITAMIN D-3) 5000 UNITS TABS Take 5,000 Units by mouth daily.      Marland Kitchen escitalopram (LEXAPRO) 20 MG tablet Take 1.5 tablets (30 mg total) by mouth daily. 60 tablet 3  . levothyroxine (SYNTHROID, LEVOTHROID) 100 MCG tablet Take 1 tablet (100 mcg total) by mouth daily before breakfast. 30 tablet 2  . Multiple Vitamins-Minerals (CENTRUM SILVER PO) Take 1 capsule by mouth daily.    . nilotinib (TASIGNA) 150 MG capsule Take 2 capsules  (300 mg total) by mouth every 12 (twelve) hours. 120 capsule 11  . traMADol (ULTRAM) 50 MG tablet Take 1 tablet (50 mg total) by mouth every 6 (six) hours as needed. 90 tablet 0  . vitamin B-12 (CYANOCOBALAMIN) 1000 MCG tablet Take 5,000 mcg by mouth daily.      No current facility-administered medications on file prior to visit.      PAST SURGICAL HISTORY Past Surgical History:  Procedure Laterality Date  . BRAVO [place] STUDY  12/04/2012   Procedure: BRAVO [place];  Surgeon: [name], MD;  [facility];  Service: Endoscopy;  Laterality: N/A;  . CHOLECYSTECTOMY N/A 01/03/2014   Procedure: LAPAROSCOPIC CHOLECYSTECTOMY WITH INTRAOPERATIVE CHOLANGIOGRAM;  Surgeon: Joyice Faster. Cornett, MD;  Location: [place];  Service: General;  Laterality: N/A;  . COLONOSCOPY  01/2004   DR [facility], few small tics  . ESOPHAGOGASTRODUODENOSCOPY  02/2010   gastric ulcers  . ESOPHAGOGASTRODUODENOSCOPY  12/04/2012   DIY:MEBRAXE gastritis (inflammation) was found in the gastric antrum; multiple biopsies The duodenal mucosa showed no abnormalities in the bulb and second portion of the duodenum  . LIPOMA EXCISION  08/02/2011   left shoulder  . NOSE SURGERY    . PARTIAL HYSTERECTOMY     vaginal at age 10 years of age  . TOE DEBRIDEMENT  1962   lt great toe  . VIDEO ASSISTED THORACOSCOPY (VATS)/EMPYEMA Left 10/25/2016   Procedure: VIDEO ASSISTED THORACOSCOPY (VATS), BRONCH,DRAINAGE OF PLEURAL EFFUSION,PERICARDIAL WINDOW WITH DRAINAGE OF PERICARDIAL FLUID, TEE;  Surgeon: [name], MD;  Location: [place];  Service: Thoracic;  Laterality: Left;  Marland Kitchen VIDEO BRONCHOSCOPY N/A 10/25/2016   Procedure: VIDEO BRONCHOSCOPY;  Surgeon: [name], MD;  Location: [place];  Service: Thoracic;  Laterality: N/A;    FAMILY HISTORY: Family History  Problem Relation Age of Onset  . Lung cancer Father   . COPD Mother   . Colon cancer Neg Hx   . Anesthesia problems Neg Hx   . Hypotension  Neg Hx   . Malignant hyperthermia Neg Hx   . Pseudochol deficiency Neg Hx     SOCIAL HISTORY:  reports that she has never smoked. She has never used smokeless tobacco. She reports that she does not drink alcohol or use drugs.  PERFORMANCE STATUS: The patient's performance status is 1 - Symptomatic but completely ambulatory  REVIEW OF SYSTEMS: Review of Systems  Constitutional: Positive for malaise/fatigue.       Night sweats  HENT: Negative.   Eyes: Negative.   Respiratory: Negative.  Negative for shortness of breath.   Cardiovascular: Positive for leg swelling. Negative for chest pain.  Gastrointestinal: Positive for  abdominal pain.  Genitourinary:       Incontinence   Musculoskeletal: Negative.   Skin: Negative.   Neurological: Negative.   Endo/Heme/Allergies: Negative.   Psychiatric/Behavioral: The patient has insomnia.   All other systems reviewed and are negative. 14 point review of systems was performed and is negative except as detailed under history of present illness and above  PHYSICAL EXAM: Most Recent Vital Signs: Blood pressure (!) 141/56, pulse 75, temperature 98.6 F (37 C), temperature source Oral, resp. rate 16, height 5' 5.5" (1.664 m), weight 202 lb 8 oz (91.9 kg), SpO2 100 %.   Physical Exam  Constitutional: She is oriented to person, place, and time and well-developed, well-nourished, and in no distress.  HENT:  Head: Normocephalic and atraumatic.  Mouth/Throat: No oropharyngeal exudate.  Eyes: EOM are normal. Pupils are equal, round, and reactive to light. No scleral icterus.  Neck: Normal range of motion. Neck supple.  Cardiovascular: Normal rate, regular rhythm and normal heart sounds.   Pulmonary/Chest: Effort normal and breath sounds normal. No respiratory distress. She has no wheezes. She has no rales. She exhibits no tenderness.  Abdominal: Soft. Bowel sounds are normal. She exhibits no distension. There is no tenderness.  Musculoskeletal:  Normal range of motion. She exhibits edema (legs b/l).  Lymphadenopathy:    She has no cervical adenopathy.  Neurological: She is alert and oriented to person, place, and time. Gait normal.  Skin: Skin is warm and dry.  Psychiatric: Mood, memory, affect and judgment normal.  Nursing note and vitals reviewed.  LABORATORY DATA:  I have reviewed the data as listed.  CBC    Component Value Date/Time   WBC 8.2 05/24/2017 1338   RBC 4.21 05/24/2017 1338   HGB 12.3 05/24/2017 1338   HCT 38.2 05/24/2017 1338   PLT 243 05/24/2017 1338   MCV 90.7 05/24/2017 1338   MCH 29.2 05/24/2017 1338   MCHC 32.2 05/24/2017 1338   RDW 14.5 05/24/2017 1338   LYMPHSABS 1.5 05/24/2017 1338   MONOABS 0.6 05/24/2017 1338   EOSABS 0.2 05/24/2017 1338   BASOSABS 0.0 05/24/2017 1338   CMP     Component Value Date/Time   NA 139 05/24/2017 1338   K 4.1 05/24/2017 1338   CL 101 05/24/2017 1338   CO2 28 05/24/2017 1338   GLUCOSE 123 (H) 05/24/2017 1338   BUN 21 (H) 05/24/2017 1338   CREATININE 0.86 05/24/2017 1338   CREATININE 0.87 07/14/2011 0938   CALCIUM 9.1 05/24/2017 1338   PROT 7.3 05/24/2017 1338   ALBUMIN 4.0 05/24/2017 1338   AST 35 05/24/2017 1338   ALT 30 05/24/2017 1338   ALKPHOS 163 (H) 05/24/2017 1338   BILITOT 1.0 05/24/2017 1338   GFRNONAA >60 05/24/2017 1338   GFRAA >60 05/24/2017 1338   PATHOLOGY:   RADIOGRAPHIC STUDIES: I have personally reviewed the below radiographic studies and agree with their findings.   CT Chest wo Contrast 01/18/2017 IMPRESSION: 1. Stable 8 mm oval lung nodule in the right lower lobe near the lung base anteriorly associated with linear scarring. Consider continued followup in 1 year if the patient is considered high risk. 2. Mild thoracic aortic atherosclerosis. 3. Minimal residual left pleural effusion versus pleural thickening.  DG Chest View 12/31/2016 IMPRESSION: 1. Small residual left pleural effusion versus chronic pleural thickening. 2.  Stable mild cardiomegaly. 3. Hyperexpanded lungs suggesting COPD.  PATHOLOGY:       EKG 02/14/17: NSR. QTC 459 ms.  ASSESSMENT/PLAN:  CML Anemia Tasigna 01/06/2016  Pericardial effusion Pleural effusion    Continue Tasigna.   Monitoring Response to TKI therapy  Bone marrow cytogenetics: at diagnosis, failure to reach response milestones and any sign of loss of response (defined as hematologic or cytogenetic relapse)  Quantitative RT-PCR using is preferred: at diagnosis, every 3 months after initiating treatment, after BCR-ABL1 0.1% - , 1% (IS) has been achieved, every 3 months for 2 years and every 3 - 6 months thereafter. If there is 1 log increase in BCR-ABL1 transcript levels with MMR, QPCR should be repeated in 1 - 3 months  BCR-ABL kinase domain mutation analysis: Chronic phase: failure to reach response milestones, any sign of loss of response (defined as hematologic or cytogenetic relapse) 1 - log increase in BCR-ABL1 transcript levels and loss of MMR. Disease progression to accelerated or blast phase  Criteria for Hematologic and molecular response   Complete Hematologic response Complete normalization of peripheral blood counts with leukocyte count ,10x103/L Platelet count < 450 x 109/L NO immature cells such as myelocytes, promyelocytes or blasts in peripheral blood No signs and symptoms of disease with disappearance of palpable splenomegaly  Molecular response Early molecular response  BCR-ABL1<= 10% (IS) at 3 and 6 months Major molecular response BCR-ABL1,0.1%  (IS) or > = 3 log reduction in BCR-ABL51mNA from the standardized baseline  Complete molecular response no detectable BCR-ABL15mA using a QPCR assay with a sensitivity of at least 4.5 logs below the standardized baseline  I have offered a referral to urology for surgical evaluation for her bladder prolapse but patient refused and stated she wanted to see her gynecologist for nonsurgical management. Offered to  get CT abd/pelvis to assess for RLQ pain, patient refused. Patient stated she wanted treatment for her night sweats but did not want to take another pharmaceutical medication. I have advised trying black cohash to see if her night sweats improve. EKG reveals slight prolongation off QT interval in comparison to the previous EKG. I discussed situation with   Nurse practitioner.  We will stop the sacrum now for 2 weeks and a repeat EKG and is him to seek either at the same dose of reduced dose as needed.   I have reviewed the above documentation for accuracy and completeness, and I agree with the above.  This note is electronically signed by:  JaForest GleasonMD  05/24/2017 2:59 PM

## 2017-05-24 NOTE — Patient Instructions (Signed)
[facility] Discharge Instructions  RECOMMENDATIONS MADE BY THE CONSULTANT AND ANY TEST RESULTS WILL BE SENT TO YOUR REFERRING PHYSICIAN.  You were seen today by Dr. Choksi.   Thank you for choosing  Cancer Center at [facility] to provide your oncology and hematology care.  To afford each patient quality time with our provider, please arrive at least 15 minutes before your scheduled appointment time.    If you have a lab appointment with the Cancer Center please come in thru the  Main Entrance and check in at the main information desk  You need to re-schedule your appointment should you arrive 10 or more minutes late.  We strive to give you quality time with our providers, and arriving late affects you and other patients whose appointments are after yours.  Also, if you no show three or more times for appointments you may be dismissed from the clinic at the providers discretion.     Again, thank you for choosing [facility].  Our hope is that these requests will decrease the amount of time that you wait before being seen by our physicians.       _____________________________________________________________  Should you have questions after your visit to  Cancer Center, please contact our office at [phone] between the hours of 8:30 a.m. and 4:30 p.m.  Voicemails left after 4:30 p.m. will not be returned until the following business day.  For prescription refill requests, have your pharmacy contact our office.       Resources For Cancer Patients and their Caregivers ? American Cancer Society: Can assist with transportation, wigs, general needs, runs Look Good Feel Better.        1-888-227-6333 ? Cancer Care: Provides financial assistance, online support groups, medication/co-pay assistance.  1-800-813-HOPE (4673) ? [facility] Assists [place] cancer patients and their  families through emotional , educational and financial support.  336-427-4357 ? [place] DSS Where to apply for food stamps, Medicaid and utility assistance. 336-342-1394 ? [facility]: Transportation to medical appointments. 336-347-2287 ? Social Security Administration: May apply for disability if have a Stage IV cancer. 336-342-7796 1-800-772-1213 ? [place] Aging, Disability and Transit Services: Assists with nutrition, care and transit needs. 336-349-2343  Cancer Center Support Programs: @10RELATIVEDAYS@ > Cancer Support Group  2nd Tuesday of the month 1pm-2pm, Journey Room  > Creative Journey  3rd Tuesday of the month 1130am-1pm, Journey Room  > Look Good Feel Better  1st Wednesday of the month 10am-12 noon, Journey Room (Call American Cancer Society to register 1-800-395-5775)    

## 2017-05-25 LAB — T3: T3, Total: 96 ng/dL (ref 71–180)

## 2017-05-30 LAB — BCR-ABL1, CML/ALL, PCR, QUANT
b2a2 transcript: 0.0336 %
b3a2 transcript: 0.0552 %

## 2017-06-06 ENCOUNTER — Other Ambulatory Visit (HOSPITAL_COMMUNITY): Payer: Self-pay | Admitting: *Deleted

## 2017-06-06 DIAGNOSIS — D509 Iron deficiency anemia, unspecified: Secondary | ICD-10-CM

## 2017-06-08 ENCOUNTER — Other Ambulatory Visit (HOSPITAL_COMMUNITY): Payer: Medicare Other

## 2017-06-08 ENCOUNTER — Ambulatory Visit (HOSPITAL_COMMUNITY): Payer: Medicare Other | Admitting: Adult Health

## 2017-06-10 ENCOUNTER — Encounter (HOSPITAL_COMMUNITY): Payer: Self-pay | Admitting: Adult Health

## 2017-06-10 ENCOUNTER — Encounter (HOSPITAL_BASED_OUTPATIENT_CLINIC_OR_DEPARTMENT_OTHER): Payer: Medicare Other | Admitting: Adult Health

## 2017-06-10 ENCOUNTER — Encounter (HOSPITAL_COMMUNITY): Payer: Medicare Other

## 2017-06-10 ENCOUNTER — Other Ambulatory Visit: Payer: Self-pay

## 2017-06-10 ENCOUNTER — Other Ambulatory Visit (HOSPITAL_COMMUNITY): Payer: Self-pay | Admitting: Pharmacist

## 2017-06-10 VITALS — BP 125/50 | HR 75 | Resp 16 | Ht 65.0 in | Wt 206.0 lb

## 2017-06-10 DIAGNOSIS — C9210 Chronic myeloid leukemia, BCR/ABL-positive, not having achieved remission: Secondary | ICD-10-CM | POA: Diagnosis not present

## 2017-06-10 DIAGNOSIS — D509 Iron deficiency anemia, unspecified: Secondary | ICD-10-CM

## 2017-06-10 DIAGNOSIS — E039 Hypothyroidism, unspecified: Secondary | ICD-10-CM

## 2017-06-10 DIAGNOSIS — N951 Menopausal and female climacteric states: Secondary | ICD-10-CM | POA: Diagnosis not present

## 2017-06-10 LAB — TSH: TSH: 34.888 u[IU]/mL — AB (ref 0.350–4.500)

## 2017-06-10 LAB — CBC WITH DIFFERENTIAL/PLATELET
Basophils Absolute: 0 10*3/uL (ref 0.0–0.1)
Basophils Relative: 0 %
EOS ABS: 0.1 10*3/uL (ref 0.0–0.7)
EOS PCT: 2 %
HCT: 37.9 % (ref 36.0–46.0)
Hemoglobin: 12.6 g/dL (ref 12.0–15.0)
LYMPHS ABS: 1.6 10*3/uL (ref 0.7–4.0)
Lymphocytes Relative: 19 %
MCH: 30.3 pg (ref 26.0–34.0)
MCHC: 33.2 g/dL (ref 30.0–36.0)
MCV: 91.1 fL (ref 78.0–100.0)
MONO ABS: 0.7 10*3/uL (ref 0.1–1.0)
MONOS PCT: 8 %
Neutro Abs: 6 10*3/uL (ref 1.7–7.7)
Neutrophils Relative %: 71 %
PLATELETS: 241 10*3/uL (ref 150–400)
RBC: 4.16 MIL/uL (ref 3.87–5.11)
RDW: 14.2 % (ref 11.5–15.5)
WBC: 8.5 10*3/uL (ref 4.0–10.5)

## 2017-06-10 LAB — COMPREHENSIVE METABOLIC PANEL
ALK PHOS: 136 U/L — AB (ref 38–126)
ALT: 25 U/L (ref 14–54)
ANION GAP: 8 (ref 5–15)
AST: 36 U/L (ref 15–41)
Albumin: 4.1 g/dL (ref 3.5–5.0)
BUN: 26 mg/dL — ABNORMAL HIGH (ref 6–20)
CALCIUM: 9.5 mg/dL (ref 8.9–10.3)
CHLORIDE: 102 mmol/L (ref 101–111)
CO2: 30 mmol/L (ref 22–32)
Creatinine, Ser: 0.96 mg/dL (ref 0.44–1.00)
GFR, EST NON AFRICAN AMERICAN: 58 mL/min — AB (ref >60)
Glucose, Bld: 97 mg/dL (ref 65–99)
Potassium: 4.3 mmol/L (ref 3.5–5.1)
SODIUM: 140 mmol/L (ref 135–145)
Total Bilirubin: 0.5 mg/dL (ref 0.3–1.2)
Total Protein: 7.4 g/dL (ref 6.5–8.1)

## 2017-06-10 MED ORDER — GABAPENTIN 300 MG PO CAPS: 300.0000 mg | ORAL_CAPSULE | Freq: Every day | ORAL | 1 refills | Status: DC

## 2017-06-10 NOTE — Progress Notes (Signed)
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)      11/21/2015 Miscellaneous    Seen by Dr. Florene Glen [facility]      11/21/2015 -  Chemotherapy    Hydrea 1000 mg and Allopurinol 300 mg daily (due to high uric acid level)      11/21/2015 Tumor Marker    BCR P210 Ratio of Fusion to Control, Blood 0.8941    P210 IS % Ratio, Blood 85.834%        01/02/2016 - 10/23/2016 Chemotherapy    Tasigna      01/08/2016 Miscellaneous    EKG- QT/QTc: 382/440 ms      01/16/2016 Miscellaneous    EKG- QT/QTc: 364/419      03/30/2016 Tumor Marker    b2a2 transcript: 0.317 b3a2 transcript: 0.194      10/23/2016 - 11/05/2016 Hospital Admission    large Left Pleural Effusion and Pericardial Effusion that is now s/p VATS with Drainage, and Drainage of Pericardial Fluid with Window and Biopsy of Pericardium      12/06/2016 -  Chemotherapy    Tasigna restarted       05/24/2017 Adverse Reaction    Prolonged QTc on EKG; likely d/t chemo. Tasigna ON HOLD until next visit.         INTERVAL HISTORY:  Andrea Martinez 71 y.o. female returns for follow-up for CML.   She is here today with her husband. Overall, she tells me she has been feeling "pretty good." Appetite is 75%; she supplements her diet with Boost. Endorses some fatigue, with energy levels of 50%. She attributes much of her fatigue to not being able to sleep well due to night sweats. She continues to be very frustrated by the drenching sweat she has both during the day and at bedtime.  She has  arthralgias, particularly to her knees, with the right being worse than the left. She has had several injections and these issues are being managed by an orthopedic specialist. She generally takes tramadol 1-2 times per day for pain. She tells me that her daughter is concerned that she may have a blood clot in her leg. Ms. Osentoski states that she does not think she has a blood clot, but does have a Baker's cyst behind her right knee, and is hoping that the orthopedic provider can help alleviate some of the pain for her. She denies any skin changes to her legs including erythema or ulcerations.  States that her depression has been a bit worse lately. Her husband provided support for her. She remains on Lexapro as prescribed. She is continuing to struggle with hypothyroidism; she sees Dr. Dorris Fetch from endocrinology who is managing.    She has been holding the Tasigna since her last visit on 05/24/17 d/t QTc prolongation as directed by Dr. Oliva Bustard. She is here to discuss treatment plans.  When asked if she has ever seen a cardiologist in [place], she reports that she has seen a cardiologist in the past, but is unsure which doctor she saw.     REVIEW OF SYSTEMS:  Review of Systems  Constitutional: Positive for chills, diaphoresis and fatigue. Negative for fever.  HENT:  Negative.  Negative for lump/mass and nosebleeds.   Eyes: Negative.   Respiratory: Negative.  Negative for cough and shortness of breath.   Cardiovascular: Positive for leg swelling. Negative for chest pain.  Gastrointestinal: Negative.  Negative for abdominal pain, blood in stool, constipation, diarrhea, nausea and vomiting.  Endocrine: Negative.   Genitourinary: Negative.  Negative for dysuria and hematuria.   Musculoskeletal: Positive for arthralgias (Taking Tramadol 1-2 x/day).  Skin: Negative.  Negative for rash.  Neurological: Negative.  Negative for dizziness and headaches.  Hematological: Negative.  Negative for adenopathy.  Does not bruise/bleed easily.  Psychiatric/Behavioral: Positive for depression and sleep disturbance. The patient is not nervous/anxious.      PAST MEDICAL/SURGICAL HISTORY:  Past Medical History:  Diagnosis Date  . 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 Gastric ulcer 2011   EGD, 5/11  . Hypothyroidism    not on meds, followed by Dr. Elyse Hsu  . Leukocytosis 11/20/2015  . Lipoma    left upper arm  . PONV (postoperative nausea and vomiting)   . UTI (lower urinary tract infection)    frequent   Past Surgical History:  Procedure Laterality Date  . BRAVO [place] STUDY  12/04/2012   Procedure: BRAVO [place];  Surgeon: [name], MD;  [facility];  Service: Endoscopy;  Laterality: N/A;  . CHOLECYSTECTOMY N/A 01/03/2014   Procedure: LAPAROSCOPIC CHOLECYSTECTOMY WITH INTRAOPERATIVE CHOLANGIOGRAM;  Surgeon: Joyice Faster. Cornett, MD;  Location: [place];  Service: General;  Laterality: N/A;  . COLONOSCOPY  01/2004   [facility], few small tics  . ESOPHAGOGASTRODUODENOSCOPY  02/2010   gastric ulcers  . ESOPHAGOGASTRODUODENOSCOPY  12/04/2012   ERD:EYCXKGY gastritis (inflammation) was found in the gastric antrum; multiple biopsies The duodenal mucosa showed no abnormalities in the bulb and second portion of the duodenum  . LIPOMA EXCISION  08/02/2011   left shoulder  . NOSE SURGERY    . PARTIAL HYSTERECTOMY     vaginal at age 43 years of age  . TOE DEBRIDEMENT  1962   lt great toe  . VIDEO ASSISTED THORACOSCOPY (VATS)/EMPYEMA Left 10/25/2016   Procedure: VIDEO ASSISTED THORACOSCOPY (VATS), BRONCH,DRAINAGE OF PLEURAL EFFUSION,PERICARDIAL WINDOW WITH DRAINAGE OF PERICARDIAL FLUID, TEE;  Surgeon: [name], MD;  Location: [place];  Service: Thoracic;  Laterality: Left;  Marland Kitchen VIDEO BRONCHOSCOPY N/A 10/25/2016   Procedure: VIDEO BRONCHOSCOPY;  Surgeon: [name], MD;  Location: [place];  Service: Thoracic;  Laterality: N/A;      SOCIAL HISTORY:  Social History   Social History  . Marital status: Married    Spouse name: N/A  . Number of children: 2  . Years of education: 12th grade   Occupational History  . retired [employer]  .  Retired   Social History Main Topics  . Smoking status: Never Smoker  . Smokeless tobacco: Never Used  . Alcohol use No  . Drug use: No  . Sexual activity: Yes    Partners: Male    Birth control/ protection: None   Other Topics Concern  . Not on file   Social History Narrative  . No narrative on file    FAMILY HISTORY:  Family History  Problem Relation Age of Onset  . Lung cancer Father   . COPD Mother   . Colon cancer Neg Hx   . Anesthesia problems Neg Hx   .  Hypotension Neg Hx   . Malignant hyperthermia Neg Hx   . Pseudochol deficiency Neg Hx     CURRENT MEDICATIONS:  Outpatient Encounter Prescriptions as of 06/10/2017  Medication Sig  . cetirizine (ZYRTEC) 10 MG tablet Take 10 mg by mouth daily.   . Cholecalciferol (VITAMIN D-3) 5000 UNITS TABS Take 5,000 Units by mouth daily.    Marland Kitchen escitalopram (LEXAPRO) 20 MG tablet Take 1.5 tablets (30 mg total) by mouth daily.  Marland Kitchen levothyroxine (SYNTHROID, LEVOTHROID) 100 MCG tablet Take 1 tablet (100 mcg total) by mouth daily before breakfast.  . Multiple Vitamins-Minerals (CENTRUM SILVER PO) Take 1 capsule by mouth daily.  . traMADol (ULTRAM) 50 MG tablet Take 1 tablet (50 mg total) by mouth every 6 (six) hours as needed.  . vitamin B-12 (CYANOCOBALAMIN) 1000 MCG tablet Take 5,000 mcg by mouth daily.   Marland Kitchen gabapentin (NEURONTIN) 300 MG capsule Take 1 capsule (300 mg total) by mouth at bedtime.  . nilotinib (TASIGNA) 150 MG capsule Take 2 capsules (300 mg total) by mouth every 12 (twelve) hours. (Patient not taking: Reported on 06/10/2017)   No facility-administered encounter medications on file as of 06/10/2017.      ALLERGIES:  Allergies  Allergen Reactions  . Codeine Nausea Only    Headache  . Esomeprazole  Magnesium Nausea Only  . Sulfonamide Derivatives Nausea Only    Headache  . Fluconazole Rash    Redness and blistering on left thigh      PHYSICAL EXAM:  ECOG Performance status: 1 - Symptomatic, but independent   Vitals:   06/10/17 1335  BP: (!) 125/50  Pulse: 75  Resp: 16    Filed Weights   06/10/17 1335  Weight: 206 lb (93.4 kg)     Physical Exam  Constitutional: She is oriented to person, place, and time and well-developed, well-nourished, and in no distress.  HENT:  Head: Normocephalic.  Mouth/Throat: Oropharynx is clear and moist. No oropharyngeal exudate.  Eyes: Conjunctivae are normal. Pupils are equal, round, and reactive to light. No scleral icterus.  Neck: Normal range of motion. Neck supple.  Cardiovascular: Normal rate, regular rhythm and normal heart sounds.   Pulmonary/Chest: Effort normal and breath sounds normal. No respiratory distress.  Abdominal: Soft. Bowel sounds are normal. There is no tenderness. There is no rebound and no guarding.  Musculoskeletal: She exhibits edema (1+ BLE edema) and tenderness (tenderness to posterior knee).  Decreased (R) knee range of motion -Negative Homan's sign    Lymphadenopathy:    She has no cervical adenopathy.       Right: No supraclavicular adenopathy present.       Left: No supraclavicular adenopathy present.  Neurological: She is alert and oriented to person, place, and time. No cranial nerve deficit. Gait normal.  Skin: Skin is warm and dry. No rash noted.  Psychiatric: Mood, memory, affect and judgment normal.  Nursing note and vitals reviewed.    LABORATORY DATA:  I have reviewed the labs as listed.  CBC    Component Value Date/Time   WBC 8.5 06/10/2017 1207   RBC 4.16 06/10/2017 1207   HGB 12.6 06/10/2017 1207   HCT 37.9 06/10/2017 1207   PLT 241 06/10/2017 1207   MCV 91.1 06/10/2017 1207   MCH 30.3 06/10/2017 1207   MCHC 33.2 06/10/2017 1207   RDW 14.2 06/10/2017 1207   LYMPHSABS 1.6  06/10/2017 1207   MONOABS 0.7 06/10/2017 1207   EOSABS 0.1 06/10/2017 1207   BASOSABS 0.0 06/10/2017 1207  CMP Latest Ref Rng & Units 06/10/2017 05/24/2017 03/29/2017  Glucose 65 - 99 mg/dL 97 123(H) 124(H)  BUN 6 - 20 mg/dL 26(H) 21(H) 20  Creatinine 0.44 - 1.00 mg/dL 0.96 0.86 0.88  Sodium 135 - 145 mmol/L 140 139 140  Potassium 3.5 - 5.1 mmol/L 4.3 4.1 4.3  Chloride 101 - 111 mmol/L 102 101 105  CO2 22 - 32 mmol/L 30 28 27   Calcium 8.9 - 10.3 mg/dL 9.5 9.1 9.4  Total Protein 6.5 - 8.1 g/dL 7.4 7.3 7.4  Total Bilirubin 0.3 - 1.2 mg/dL 0.5 1.0 0.8  Alkaline Phos 38 - 126 U/L 136(H) 163(H) 157(H)  AST 15 - 41 U/L 36 35 40  ALT 14 - 54 U/L 25 30 32      PENDING LABS:      DIAGNOSTIC IMAGING:  I have reviewed & agree with the reported results. CT chest: 10/23/16    Chest X-ray: 12/31/16      PATHOLOGY:      EKG:  Most recent EKG: 05/24/17    Baseline EKG: 01/08/16        ASSESSMENT & PLAN:  Ms. Houchins is a pleasant 71 y.o. female with CML, BCR-ABL1+; started on Tasigna on 01/02/16. Treatment course complicated by left pleural effusion & pericardial effusion requiring pericardial window in 10/2016. Tasigna restarted on 12/06/16.    CML, BCR-ABL1+: -Most recent BCR-ABL lab evaluation with improving b2a2 and b3a2 transcripts.  -CBC with diff reviewed and is completely normal today.   -EKG 02/14/17 without QTc prolongation. Then repeat EKG on 05/24/17 revealed prolonged QT/QTc 406/441. Tasigna was placed ON HOLD on 05/24/17 as a result.  -Baseline EKG from 12/2015 reviewed and QT/QTc was 372/429 at that time.  -EKG performed today and QT/QTc mildly higher than previous and is 410/451 today. Therefore, QTc prolongation not likely to be solely secondary to Tasigna.  NCCN Guidelines and UpToDate reviewed; both recommend that  -Discussed with Dr. Talbert Cage and with Pharmacist, Milbert Coulter. Tramadol can cause some prolonged QTc; as previously noted Lexapro in combo with  Tasigna can prolong QTc.  She also has a history of pericardial effusion requiring pericardial window which may be contributing as well.  -Clinically she is asymptomatic of any cardiac symptoms. Potassium is normal.  We will resume Tasigna at prior dose (300 mg BID). She will return in 1 month; if continued QTc prolongation, then will consider dose-reducing her to 400 mg daily dosing of Tasigna. Will also refer her to Cardiology for their evaluation and assistance in management.  -Labs in 3 weeks (per patient request), then return to cancer center in 1 month with repeat EKG. I will add-on magnesium to be checked with labs in 3 weeks.   Hypothyroidism:  -Not likely to be secondary to Tasigna. -Referred to endocrinology in the past and they are managing. She is scheduled to see Dr. Dorris Fetch on 07/07/17 for follow-up with labs.  Per patient request, I will order TSH/T3/free T4 for endocrinology when she returns for labs in about 3 weeks.    Night sweats: -Could be multifactorial, including disease process and treatment. Night sweats do occur in about 12-27% of the time for patients on Tasigna.  Could also be post-menopausal hot flashes/night sweats.   -We have previously discussed a trial of gabapentin 300 mg at bedtime.  She is willing to give this medication a try now.  E-scribed gabapentin 300 mg QHS. Discussed possible side effects including drowsiness and initial dizziness.  If her symptoms improve, then  we are happy to continue.  Benefits of gabapentin would be helping her sleep, improving hot flashes/night sweats, and possibly improving her arthralgias to a degree as well.    Pleural effusion/Pericardial effusion:  -Resolved s/p pericardial window in 10/2016; followed by Dr. Roxan Hockey with Cardio-Thoracic Surgery.  Most recent CT chest done 01/18/17.   BLE pain/swelling:  -Chronic orthopedic issues to bilateral knees; there is associated mild bilateral lower extremity edema on exam. Homan's sign is  negative. There is no skin erythema. (R) knee/leg pain is worse than left. Offered to send her for bilateral doppler ultrasounds to r/o DVT, but she declines. She would prefer to wait to see if knee injections from orthopedic specialist improves her symptoms.  She understands that malignancy puts her at increased risk of blood clot and she should call with worsening symptoms. Encouraged her to follow-up with orthopedics as directed.   Iron deficiency anemia:  -No active bleeding  -Required dose of IV iron on 02/25/17; tolerated well. Iron studies pending. We will keep monitoring.      Dispo:  -Labs only in 3 weeks.  -Return to cancer center in 1 month for follow-up.    All questions were answered to patient's stated satisfaction. Encouraged patient to call with any new concerns or questions before her next visit to the cancer center and we can certain see her sooner, if needed.    Plan of care discussed with Dr. Talbert Cage, who agrees with above aforementioned.     Orders placed this encounter:  Orders Placed This Encounter  Procedures  . BCR-ABL1, CML/ALL, PCR, QUANT  . CBC with Differential/Platelet  . Comprehensive metabolic panel  . Iron and TIBC  . Ferritin  . Magnesium  . TSH  . T4, free  . [place], NP [place] [phone]

## 2017-06-10 NOTE — Patient Instructions (Addendum)
[place] at [facility] Discharge Instructions  RECOMMENDATIONS MADE BY THE CONSULTANT AND ANY TEST RESULTS WILL BE SENT TO YOUR REFERRING PHYSICIAN.  You were seen today by Mike Craze NP. New Rx for gabapentin sent to your pharmacy. Take at bedtime to help with night sweats/hotflashes. Return in 3 weeks for labs. Return in 1 month for follow up.    Thank you for choosing [place] at [facility] to provide your oncology and hematology care.  To afford each patient quality time with our provider, please arrive at least 15 minutes before your scheduled appointment time.    If you have a lab appointment with the [facility] please come in thru the  Main Entrance and check in at the main information desk  You need to re-schedule your appointment should you arrive 10 or more minutes late.  We strive to give you quality time with our providers, and arriving late affects you and other patients whose appointments are after yours.  Also, if you no show three or more times for appointments you may be dismissed from the clinic at the providers discretion.     Again, thank you for choosing [facility].  Our hope is that these requests will decrease the amount of time that you wait before being seen by our physicians.       _____________________________________________________________  Should you have questions after your visit to [facility], please contact our office at [phone] between the hours of 8:30 a.m. and 4:30 p.m.  Voicemails left after 4:30 p.m. will not be returned until the following business day.  For prescription refill requests, have your pharmacy contact our office.       Resources For Cancer Patients and their Caregivers ? American Cancer Society: Can assist with transportation, wigs, general needs, runs Look Good Feel Better.        (505)077-8107 ? Cancer Care: Provides financial  assistance, online support groups, medication/co-pay assistance.  1-800-813-HOPE (931) 464-6922) ? [place] Assists [place] cancer patients and their families through emotional , educational and financial support.  267-072-8789 ? [place] DSS Where to apply for food stamps, Medicaid and utility assistance. (226)120-2209 ? [facility]: Transportation to medical appointments. 508-591-1031 ? Social Security Administration: May apply for disability if have a Stage IV cancer. 443-129-7353 423-110-8166 ? [employer], Disability and Transit Services: Assists with nutrition, care and transit needs. [place] Support Programs: @10RELATIVEDAYS @ > Cancer Support Group  2nd Tuesday of the month 1pm-2pm, Journey Room  > Creative Journey  3rd Tuesday of the month 1130am-1pm, Journey Room  > Look Good Feel Better  1st Wednesday of the month 10am-12 noon, Journey Room (Call [place] to register [phone])

## 2017-06-11 LAB — IRON AND TIBC
Iron: 48 ug/dL (ref 28–170)
Saturation Ratios: 15 % (ref 10.4–31.8)
TIBC: 323 ug/dL (ref 250–450)
UIBC: 275 ug/dL

## 2017-06-11 LAB — FERRITIN: FERRITIN: 136 ng/mL (ref 11–307)

## 2017-06-20 DIAGNOSIS — M170 Bilateral primary osteoarthritis of knee: Secondary | ICD-10-CM | POA: Diagnosis not present

## 2017-06-20 DIAGNOSIS — M25562 Pain in left knee: Secondary | ICD-10-CM | POA: Diagnosis not present

## 2017-06-20 DIAGNOSIS — G8929 Other chronic pain: Secondary | ICD-10-CM | POA: Diagnosis not present

## 2017-06-20 DIAGNOSIS — M25561 Pain in right knee: Secondary | ICD-10-CM | POA: Diagnosis not present

## 2017-06-20 DIAGNOSIS — M1712 Unilateral primary osteoarthritis, left knee: Secondary | ICD-10-CM | POA: Diagnosis not present

## 2017-06-23 ENCOUNTER — Other Ambulatory Visit (HOSPITAL_COMMUNITY): Payer: Self-pay | Admitting: Hematology & Oncology

## 2017-06-27 DIAGNOSIS — C9220 Atypical chronic myeloid leukemia, BCR/ABL-negative, not having achieved remission: Secondary | ICD-10-CM | POA: Diagnosis not present

## 2017-06-27 DIAGNOSIS — E039 Hypothyroidism, unspecified: Secondary | ICD-10-CM | POA: Diagnosis not present

## 2017-06-27 DIAGNOSIS — K219 Gastro-esophageal reflux disease without esophagitis: Secondary | ICD-10-CM | POA: Diagnosis not present

## 2017-06-27 DIAGNOSIS — M1990 Unspecified osteoarthritis, unspecified site: Secondary | ICD-10-CM | POA: Diagnosis not present

## 2017-06-30 ENCOUNTER — Other Ambulatory Visit: Payer: Self-pay | Admitting: *Deleted

## 2017-06-30 DIAGNOSIS — R911 Solitary pulmonary nodule: Secondary | ICD-10-CM

## 2017-07-01 ENCOUNTER — Encounter (HOSPITAL_COMMUNITY): Payer: Medicare Other | Attending: Oncology

## 2017-07-01 DIAGNOSIS — C9210 Chronic myeloid leukemia, BCR/ABL-positive, not having achieved remission: Secondary | ICD-10-CM | POA: Diagnosis not present

## 2017-07-01 DIAGNOSIS — E039 Hypothyroidism, unspecified: Secondary | ICD-10-CM

## 2017-07-01 DIAGNOSIS — D509 Iron deficiency anemia, unspecified: Secondary | ICD-10-CM

## 2017-07-01 DIAGNOSIS — N951 Menopausal and female climacteric states: Secondary | ICD-10-CM | POA: Insufficient documentation

## 2017-07-01 LAB — CBC WITH DIFFERENTIAL/PLATELET
BASOS ABS: 0 10*3/uL (ref 0.0–0.1)
Basophils Relative: 0 %
Eosinophils Absolute: 0.1 10*3/uL (ref 0.0–0.7)
Eosinophils Relative: 1 %
HEMATOCRIT: 39 % (ref 36.0–46.0)
HEMOGLOBIN: 12.7 g/dL (ref 12.0–15.0)
LYMPHS PCT: 16 %
Lymphs Abs: 1.6 10*3/uL (ref 0.7–4.0)
MCH: 30.1 pg (ref 26.0–34.0)
MCHC: 32.6 g/dL (ref 30.0–36.0)
MCV: 92.4 fL (ref 78.0–100.0)
MONO ABS: 0.5 10*3/uL (ref 0.1–1.0)
Monocytes Relative: 6 %
NEUTROS ABS: 7.5 10*3/uL (ref 1.7–7.7)
NEUTROS PCT: 77 %
Platelets: 233 10*3/uL (ref 150–400)
RBC: 4.22 MIL/uL (ref 3.87–5.11)
RDW: 13.2 % (ref 11.5–15.5)
WBC: 9.8 10*3/uL (ref 4.0–10.5)

## 2017-07-01 LAB — IRON AND TIBC
Iron: 47 ug/dL (ref 28–170)
Saturation Ratios: 15 % (ref 10.4–31.8)
TIBC: 321 ug/dL (ref 250–450)
UIBC: 274 ug/dL

## 2017-07-01 LAB — COMPREHENSIVE METABOLIC PANEL
ALK PHOS: 161 U/L — AB (ref 38–126)
ALT: 27 U/L (ref 14–54)
AST: 34 U/L (ref 15–41)
Albumin: 4.3 g/dL (ref 3.5–5.0)
Anion gap: 9 (ref 5–15)
BILIRUBIN TOTAL: 0.8 mg/dL (ref 0.3–1.2)
BUN: 24 mg/dL — AB (ref 6–20)
CALCIUM: 9.1 mg/dL (ref 8.9–10.3)
CO2: 28 mmol/L (ref 22–32)
CREATININE: 0.91 mg/dL (ref 0.44–1.00)
Chloride: 102 mmol/L (ref 101–111)
GFR calc Af Amer: >60 mL/min (ref >60)
Glucose, Bld: 91 mg/dL (ref 65–99)
POTASSIUM: 4.1 mmol/L (ref 3.5–5.1)
Sodium: 139 mmol/L (ref 135–145)
TOTAL PROTEIN: 7.6 g/dL (ref 6.5–8.1)

## 2017-07-01 LAB — MAGNESIUM: MAGNESIUM: 2 mg/dL (ref 1.7–2.4)

## 2017-07-01 LAB — FERRITIN: Ferritin: 132 ng/mL (ref 11–307)

## 2017-07-01 LAB — T4, FREE: FREE T4: 0.99 ng/dL (ref 0.61–1.12)

## 2017-07-01 LAB — TSH: TSH: 9.178 u[IU]/mL — AB (ref 0.350–4.500)

## 2017-07-02 LAB — T3: T3, Total: 91 ng/dL (ref 71–180)

## 2017-07-07 ENCOUNTER — Encounter: Payer: Self-pay | Admitting: "Endocrinology

## 2017-07-07 ENCOUNTER — Ambulatory Visit (INDEPENDENT_AMBULATORY_CARE_PROVIDER_SITE_OTHER): Payer: Medicare Other | Admitting: "Endocrinology

## 2017-07-07 VITALS — BP 130/82 | HR 73 | Ht 65.0 in | Wt 207.0 lb

## 2017-07-07 DIAGNOSIS — E039 Hypothyroidism, unspecified: Secondary | ICD-10-CM

## 2017-07-07 MED ORDER — LEVOTHYROXINE SODIUM 125 MCG PO TABS: 125.0000 ug | ORAL_TABLET | Freq: Every day | ORAL | 3 refills | Status: DC

## 2017-07-07 NOTE — Progress Notes (Signed)
Subjective:    Patient ID: Andrea Martinez, female    DOB: 1946/10/03, PCP Sinda Du, MD   Past Medical History:  Diagnosis Date  . Chronic myelogenous leukemia (CML), BCR-ABL1-positive ([place]) 11/20/2015  . Gastric ulcer 2011   EGD, 5/11  . Hypothyroidism    not on meds, followed by Dr. Elyse Hsu  . Leukocytosis 11/20/2015  . Lipoma    left upper arm  . PONV (postoperative nausea and vomiting)   . UTI (lower urinary tract infection)    frequent   Past Surgical History:  Procedure Laterality Date  . BRAVO [place] STUDY  12/04/2012   Procedure: BRAVO [place];  Surgeon: [name], MD;  [facility];  Service: Endoscopy;  Laterality: N/A;  . CHOLECYSTECTOMY N/A 01/03/2014   Procedure: LAPAROSCOPIC CHOLECYSTECTOMY WITH INTRAOPERATIVE CHOLANGIOGRAM;  Surgeon: Joyice Faster. Cornett, MD;  Location: [facility] [address];  Service: General;  Laterality: N/A;  . COLONOSCOPY  01/2004   DR [facility], few small tics  . ESOPHAGOGASTRODUODENOSCOPY  02/2010   gastric ulcers  . ESOPHAGOGASTRODUODENOSCOPY  12/04/2012   [id number] gastritis (inflammation) was found in the gastric antrum; multiple biopsies The duodenal mucosa showed no abnormalities in the bulb and second portion of the duodenum  . LIPOMA EXCISION  08/02/2011   left shoulder  . NOSE SURGERY    . PARTIAL HYSTERECTOMY     vaginal at age 25 years of age  . TOE DEBRIDEMENT  1962   lt great toe  . VIDEO ASSISTED THORACOSCOPY (VATS)/EMPYEMA Left 10/25/2016   Procedure: VIDEO ASSISTED THORACOSCOPY (VATS), BRONCH,DRAINAGE OF PLEURAL EFFUSION,PERICARDIAL WINDOW WITH DRAINAGE OF PERICARDIAL FLUID, TEE;  Surgeon: [name], MD;  Location: [place];  Service: Thoracic;  Laterality: Left;  Marland Kitchen VIDEO BRONCHOSCOPY N/A 10/25/2016   Procedure: VIDEO BRONCHOSCOPY;  Surgeon: [name], MD;  Location: [place];  Service: Thoracic;  Laterality: N/A;   Social History   Social History  . Marital status: Married     Spouse name: N/A  . Number of children: 2  . Years of education: 12th grade   Occupational History  . retired [employer]  .  Retired   Social History Main Topics  . Smoking status: Never Smoker  . Smokeless tobacco: Never Used  . Alcohol use No  . Drug use: No  . Sexual activity: Yes    Partners: Male    Birth control/ protection: None   Other Topics Concern  . None   Social History Narrative  . None   Outpatient Encounter Prescriptions as of 07/07/2017  Medication Sig  . cetirizine (ZYRTEC) 10 MG tablet Take 10 mg by mouth daily.   . Cholecalciferol (VITAMIN D-3) 5000 UNITS TABS Take 5,000 Units by mouth daily.    Marland Kitchen escitalopram (LEXAPRO) 20 MG tablet TAKE 1 AND 1/2 TABLET DAILY  . gabapentin (NEURONTIN) 300 MG capsule Take 1 capsule (300 mg total) by mouth at bedtime.  Marland Kitchen levothyroxine (SYNTHROID, LEVOTHROID) 125 MCG tablet Take 1 tablet (125 mcg total) by mouth daily before breakfast.  . Multiple Vitamins-Minerals (CENTRUM SILVER PO) Take 1 capsule by mouth daily.  . nilotinib (TASIGNA) 150 MG capsule Take 2 capsules (300 mg total) by mouth every 12 (twelve) hours. (Patient not taking: Reported on 06/10/2017)  . traMADol (ULTRAM) 50 MG tablet Take 1 tablet (50 mg total) by mouth every 6 (six) hours as needed.  . vitamin B-12 (CYANOCOBALAMIN) 1000 MCG tablet Take 5,000 mcg by mouth daily.   . [DISCONTINUED]  levothyroxine (SYNTHROID, LEVOTHROID) 100 MCG tablet Take 1 tablet (100 mcg total) by mouth daily before breakfast.   No facility-administered encounter medications on file as of 07/07/2017.    ALLERGIES: Allergies  Allergen Reactions  . Codeine Nausea Only    Headache  . Esomeprazole Magnesium Nausea Only  . Sulfonamide Derivatives Nausea Only    Headache  . Fluconazole Rash    Redness and blistering on left thigh    VACCINATION STATUS: Immunization History  Administered Date(s) Administered  . Influenza-Unspecified 09/24/2015    HPI 71 year old female  patient with medical history as above. She is being seen in f/u  for hypothyroidism . -She says she was told about hypothyroidism for at least 8 years however did not require thyroid hormone replacement until 3 months ago when she was started on 50 g of levothyroxine which was since increased to 100 g due to increasing TSH. - Since her last visit, she felt better however She complains of progressive weight gain, fatigue, sweating.  she is on medications for CML- with nilotinib. -She has family history of thyroid problem involving goiter which required thyroidectomy in her mother who took thyroid hormone replacement subsequently. -She denies dysphagia, shortness of breath, voice change. -She is compliant to her medications. She is taking  Review of Systems  Constitutional: + weight gain, + fatigue, + subjective hyperthermia.  Eyes: no blurry vision, no xerophthalmia ENT: no sore throat, no nodules palpated in throat, no dysphagia/odynophagia, no hoarseness Cardiovascular: no Chest Pain, no Shortness of Breath, no palpitations, no leg swelling Respiratory: no cough, no SOB Gastrointestinal: no Nausea/Vomiting/Diarhhea Musculoskeletal: no muscle/joint aches Skin: no rashes Neurological: + tremors, no numbness, no tingling, no dizziness Psychiatric: no depression, no anxiety  Objective:    BP 130/82   Pulse 73   Ht 5\' 5"  (1.651 m)   Wt 207 lb (93.9 kg)   BMI 34.45 kg/m   Wt Readings from Last 3 Encounters:  07/07/17 207 lb (93.9 kg)  06/10/17 206 lb (93.4 kg)  05/24/17 202 lb 8 oz (91.9 kg)    Physical Exam  Constitutional:  Obese,  not in acute distress, normal state of mind Eyes: PERRLA, EOMI, no exophthalmos ENT: moist mucous membranes, no thyromegaly, no cervical lymphadenopathy Cardiovascular: normal precordial activity, Regular Rate and Rhythm, no Murmur/Rubs/Gallops Respiratory:  adequate breathing efforts, no gross chest deformity, Clear to auscultation  bilaterally Gastrointestinal: abdomen soft, Non -tender, No distension, Bowel Sounds present Musculoskeletal: no gross deformities, strength intact in all four extremities Skin: moist, warm, no rashes Neurological: + Mild tremor with outstretched hands, Deep tendon reflexes normal in all four extremities.  CMP ( most recent) CMP     Component Value Date/Time   NA 139 07/01/2017 1111   K 4.1 07/01/2017 1111   CL 102 07/01/2017 1111   CO2 28 07/01/2017 1111   GLUCOSE 91 07/01/2017 1111   BUN 24 (H) 07/01/2017 1111   CREATININE 0.91 07/01/2017 1111   CREATININE 0.87 07/14/2011 0938   CALCIUM 9.1 07/01/2017 1111   PROT 7.6 07/01/2017 1111   ALBUMIN 4.3 07/01/2017 1111   AST 34 07/01/2017 1111   ALT 27 07/01/2017 1111   ALKPHOS 161 (H) 07/01/2017 1111   BILITOT 0.8 07/01/2017 1111   GFRNONAA >60 07/01/2017 1111   GFRAA >60 07/01/2017 1111     Diabetic Labs (most recent): Lab Results  Component Value Date   HGBA1C 5.3 10/28/2016    Results for Andrea, Martinez (MRN [id number]) as of 07/07/2017 14:10  Ref. Range 04/26/2017 10:41 05/24/2017 13:38 06/10/2017 12:07 07/01/2017 11:11  TSH Latest Ref Range: 0.350 - 4.500 uIU/mL 40.527 (H) 17.664 (H) 34.888 (H) 9.178 (H)  Triiodothyronine (T3) Latest Ref Range: 71 - 180 ng/dL  96  91  T4,Free(Direct) Latest Ref Range: 0.61 - 1.12 ng/dL  0.98  0.99    Assessment & Plan:   1. Hypothyroidism, unspecified type - Her repeat thyroid function tests are improving with TSH 9.17 and free T4 0.99. -she will benefit from a  Still higher dose of levothyroxine.   I discussed and increased her levothyroxine to 125 g by mouth every morning.  It is likely that she will require higher dose based on subsequent measurements of TSH and free T4.  - We discussed about correct intake of levothyroxine, at fasting, with water, separated by at least 30 minutes from breakfast, and separated by more than 4 hours from calcium, iron, multivitamins, acid reflux  medications (PPIs). -Patient is made aware of the fact that thyroid hormone replacement is needed for life, dose to be adjusted by periodic monitoring of thyroid function tests.  - Her clinical exam is negative for goiter, hence no need for imaging of the thyroid at this time.   - I advised patient to maintain close follow up with Sinda Du, MD for primary care needs. Follow up plan: Return in about 3 months (around 10/07/2017) for follow up with pre-visit labs.  Glade Lloyd, MD Phone: [phone]  Fax: [phone]   07/07/2017, 2:23 PM

## 2017-07-08 ENCOUNTER — Other Ambulatory Visit: Payer: Self-pay

## 2017-07-08 DIAGNOSIS — E039 Hypothyroidism, unspecified: Secondary | ICD-10-CM

## 2017-07-08 MED ORDER — LEVOTHYROXINE SODIUM 125 MCG PO TABS: 125.0000 ug | ORAL_TABLET | Freq: Every day | ORAL | 1 refills | Status: DC

## 2017-07-11 ENCOUNTER — Encounter (HOSPITAL_BASED_OUTPATIENT_CLINIC_OR_DEPARTMENT_OTHER): Payer: Medicare Other | Admitting: Oncology

## 2017-07-11 ENCOUNTER — Other Ambulatory Visit: Payer: Self-pay

## 2017-07-11 ENCOUNTER — Encounter (HOSPITAL_COMMUNITY): Payer: Self-pay

## 2017-07-11 VITALS — BP 137/69 | HR 77 | Temp 98.2°F | Resp 20 | Ht 65.0 in | Wt 207.0 lb

## 2017-07-11 DIAGNOSIS — C9210 Chronic myeloid leukemia, BCR/ABL-positive, not having achieved remission: Secondary | ICD-10-CM | POA: Diagnosis not present

## 2017-07-11 DIAGNOSIS — E039 Hypothyroidism, unspecified: Secondary | ICD-10-CM

## 2017-07-11 DIAGNOSIS — R61 Generalized hyperhidrosis: Secondary | ICD-10-CM

## 2017-07-11 DIAGNOSIS — D509 Iron deficiency anemia, unspecified: Secondary | ICD-10-CM | POA: Diagnosis not present

## 2017-07-11 NOTE — Progress Notes (Signed)
[address] [place], [place] [address]   CLINIC:  Medical Oncology/Hematology  PCP:  Sinda Du, MD [place] [address] [phone]   REASON FOR VISIT:  Follow-up for CML, BCR-ABL1+  CURRENT THERAPY: Tasigna  BRIEF ONCOLOGIC HISTORY:    CML (chronic myeloid leukemia) ([place])   11/21/2015 Initial Diagnosis    Chronic myelogenous leukemia (CML), BCR-ABL1-positive ([place])      11/21/2015 Miscellaneous    Seen by Dr. Florene Glen [facility]      11/21/2015 -  Chemotherapy    Hydrea 1000 mg and Allopurinol 300 mg daily (due to high uric acid level)      11/21/2015 Tumor Marker    BCR P210 Ratio of Fusion to Control, Blood 0.8941    P210 IS % Ratio, Blood 85.834%        01/02/2016 - 10/23/2016 Chemotherapy    Tasigna      01/08/2016 Miscellaneous    EKG- QT/QTc: 382/440 ms      01/16/2016 Miscellaneous    EKG- QT/QTc: 364/419      03/30/2016 Tumor Marker    b2a2 transcript: 0.317 b3a2 transcript: 0.194      10/23/2016 - 11/05/2016 Hospital Admission    large Left Pleural Effusion and Pericardial Effusion that is now s/p VATS with Drainage, and Drainage of Pericardial Fluid with Window and Biopsy of Pericardium      12/06/2016 -  Chemotherapy    Tasigna restarted       05/24/2017 Adverse Reaction    Prolonged QTc on EKG; likely d/t chemo. Tasigna ON HOLD until next visit.         INTERVAL HISTORY:  Andrea Martinez 71 y.o. female returns for follow-up for CML.   Patient presents today for continued follow up. She restarted her tasigna on 06/11/17. She states she has been doing well on the [place] without any issues. She states she has been on synthroid for her hypothyroidism and is concerned that she is not losing any of the weight that she's gained. She denies any cough, shortness of breath, chest pain, abdominal pain. She complains of chronic knee pain which she states has been exacerbated by her weight  gain.    REVIEW OF SYSTEMS:  Review of Systems  Constitutional: Negative for chills, diaphoresis, fatigue and fever.  HENT:  Negative.  Negative for lump/mass and nosebleeds.   Eyes: Negative.   Respiratory: Negative.  Negative for cough and shortness of breath.   Cardiovascular: Negative for chest pain and leg swelling.  Gastrointestinal: Negative.  Negative for abdominal pain, blood in stool, constipation, diarrhea, nausea and vomiting.  Endocrine: Negative.   Genitourinary: Negative.  Negative for dysuria and hematuria.   Musculoskeletal: Positive for arthralgias (in her knees).  Skin: Negative.  Negative for rash.  Neurological: Negative.  Negative for dizziness and headaches.  Hematological: Negative.  Negative for adenopathy. Does not bruise/bleed easily.  Psychiatric/Behavioral: Negative for depression and sleep disturbance. The patient is not nervous/anxious.      PAST MEDICAL/SURGICAL HISTORY:  Past Medical History:  Diagnosis Date  . Chronic myelogenous leukemia (CML), BCR-ABL1-positive ([place]) 11/20/2015  . Gastric ulcer 2011   EGD, 5/11  . Hypothyroidism    not on meds, followed by Dr. Elyse Hsu  . Leukocytosis 11/20/2015  . Lipoma    left upper arm  . PONV (postoperative nausea and vomiting)   . UTI (lower urinary tract infection)    frequent   Past Surgical History:  Procedure  Laterality Date  . BRAVO [place] STUDY  12/04/2012   Procedure: BRAVO [place];  Surgeon: [name], MD;  [facility];  Service: Endoscopy;  Laterality: N/A;  . CHOLECYSTECTOMY N/A 01/03/2014   Procedure: LAPAROSCOPIC CHOLECYSTECTOMY WITH INTRAOPERATIVE CHOLANGIOGRAM;  Surgeon: Joyice Faster. Cornett, MD;  Location: [place];  Service: General;  Laterality: N/A;  . COLONOSCOPY  01/2004   DR [facility], few small tics  . ESOPHAGOGASTRODUODENOSCOPY  02/2010   gastric ulcers  . ESOPHAGOGASTRODUODENOSCOPY  12/04/2012   IOM:[id number] gastritis (inflammation) was found in the  gastric antrum; multiple biopsies The duodenal mucosa showed no abnormalities in the bulb and second portion of the duodenum  . LIPOMA EXCISION  08/02/2011   left shoulder  . NOSE SURGERY    . PARTIAL HYSTERECTOMY     vaginal at age 11 years of age  . TOE DEBRIDEMENT  1962   lt great toe  . VIDEO ASSISTED THORACOSCOPY (VATS)/EMPYEMA Left 10/25/2016   Procedure: VIDEO ASSISTED THORACOSCOPY (VATS), BRONCH,DRAINAGE OF PLEURAL EFFUSION,PERICARDIAL WINDOW WITH DRAINAGE OF PERICARDIAL FLUID, TEE;  Surgeon: [name], MD;  Location: [place];  Service: Thoracic;  Laterality: Left;  Marland Kitchen VIDEO BRONCHOSCOPY N/A 10/25/2016   Procedure: VIDEO BRONCHOSCOPY;  Surgeon: [name], MD;  Location: [place];  Service: Thoracic;  Laterality: N/A;     SOCIAL HISTORY:  Social History   Social History  . Marital status: Married    Spouse name: N/A  . Number of children: 2  . Years of education: 12th grade   Occupational History  . retired [employer]  .  Retired   Social History Main Topics  . Smoking status: Never Smoker  . Smokeless tobacco: Never Used  . Alcohol use No  . Drug use: No  . Sexual activity: Yes    Partners: Male    Birth control/ protection: None   Other Topics Concern  . Not on file   Social History Narrative  . No narrative on file    FAMILY HISTORY:  Family History  Problem Relation Age of Onset  . Lung cancer Father   . COPD Mother   . Colon cancer Neg Hx   . Anesthesia problems Neg Hx   . Hypotension Neg Hx   . Malignant hyperthermia Neg Hx   . Pseudochol deficiency Neg Hx     CURRENT MEDICATIONS:  Outpatient Encounter Prescriptions as of 07/11/2017  Medication Sig  . cetirizine (ZYRTEC) 10 MG tablet Take 10 mg by mouth daily.   . Cholecalciferol (VITAMIN D-3) 5000 UNITS TABS Take 5,000 Units by mouth daily.    Marland Kitchen escitalopram (LEXAPRO) 20 MG tablet TAKE 1 AND 1/2 TABLET DAILY  . gabapentin (NEURONTIN) 300 MG capsule Take 1 capsule (300 mg total)  by mouth at bedtime.  . Multiple Vitamins-Minerals (CENTRUM SILVER PO) Take 1 capsule by mouth daily.  . nilotinib (TASIGNA) 150 MG capsule Take 2 capsules (300 mg total) by mouth every 12 (twelve) hours.  . traMADol (ULTRAM) 50 MG tablet Take 1 tablet (50 mg total) by mouth every 6 (six) hours as needed.  . vitamin B-12 (CYANOCOBALAMIN) 1000 MCG tablet Take 5,000 mcg by mouth daily.   Marland Kitchen levothyroxine (SYNTHROID, LEVOTHROID) 125 MCG tablet Take 1 tablet (125 mcg total) by mouth daily before breakfast. (Patient not taking: Reported on 07/11/2017)   No facility-administered encounter medications on file as of 07/11/2017.      ALLERGIES:  Allergies  Allergen Reactions  . Codeine Nausea Only  Headache  . Esomeprazole Magnesium Nausea Only  . Sulfonamide Derivatives Nausea Only    Headache  . Fluconazole Rash    Redness and blistering on left thigh      PHYSICAL EXAM:  ECOG Performance status: 1 - Symptomatic, but independent   Vitals:   07/11/17 1141  BP: 137/69  Pulse: 77  Resp: 20  Temp: 98.2 F (36.8 C)    Filed Weights   07/11/17 1141  Weight: 207 lb (93.9 kg)     Physical Exam  Constitutional: She is oriented to person, place, and time and well-developed, well-nourished, and in no distress.  HENT:  Head: Normocephalic.  Mouth/Throat: Oropharynx is clear and moist. No oropharyngeal exudate.  Eyes: Pupils are equal, round, and reactive to light. Conjunctivae are normal. No scleral icterus.  Neck: Normal range of motion. Neck supple.  Cardiovascular: Normal rate, regular rhythm and normal heart sounds.   Pulmonary/Chest: Effort normal and breath sounds normal. No respiratory distress.  Abdominal: Soft. Bowel sounds are normal. There is no tenderness. There is no rebound and no guarding.  Musculoskeletal: She exhibits no edema or tenderness.  Lymphadenopathy:    She has no cervical adenopathy.       Right: No supraclavicular adenopathy present.       Left: No  supraclavicular adenopathy present.  Neurological: She is alert and oriented to person, place, and time. No cranial nerve deficit. Gait normal.  Skin: Skin is warm and dry. No rash noted.  Psychiatric: Mood, memory, affect and judgment normal.  Nursing note and vitals reviewed.    LABORATORY DATA:  I have reviewed the labs as listed.  CBC    Component Value Date/Time   WBC 9.8 07/01/2017 1111   RBC 4.22 07/01/2017 1111   HGB 12.7 07/01/2017 1111   HCT 39.0 07/01/2017 1111   PLT 233 07/01/2017 1111   MCV 92.4 07/01/2017 1111   MCH 30.1 07/01/2017 1111   MCHC 32.6 07/01/2017 1111   RDW 13.2 07/01/2017 1111   LYMPHSABS 1.6 07/01/2017 1111   MONOABS 0.5 07/01/2017 1111   EOSABS 0.1 07/01/2017 1111   BASOSABS 0.0 07/01/2017 1111   CMP Latest Ref Rng & Units 07/01/2017 06/10/2017 05/24/2017  Glucose 65 - 99 mg/dL 91 97 123(H)  BUN 6 - 20 mg/dL 24(H) 26(H) 21(H)  Creatinine 0.44 - 1.00 mg/dL 0.91 0.96 0.86  Sodium 135 - 145 mmol/L 139 140 139  Potassium 3.5 - 5.1 mmol/L 4.1 4.3 4.1  Chloride 101 - 111 mmol/L 102 102 101  CO2 22 - 32 mmol/L 28 30 28   Calcium 8.9 - 10.3 mg/dL 9.1 9.5 9.1  Total Protein 6.5 - 8.1 g/dL 7.6 7.4 7.3  Total Bilirubin 0.3 - 1.2 mg/dL 0.8 0.5 1.0  Alkaline Phos 38 - 126 U/L 161(H) 136(H) 163(H)  AST 15 - 41 U/L 34 36 35  ALT 14 - 54 U/L 27 25 30       PENDING LABS:      DIAGNOSTIC IMAGING:  I have reviewed & agree with the reported results. CT chest: 10/23/16    Chest X-ray: 12/31/16      PATHOLOGY:      EKG:  Most recent EKG: 05/24/17    Baseline EKG: 01/08/16        ASSESSMENT & PLAN:  Ms. Canul is a pleasant 71 y.o. female with CML, BCR-ABL1+; started on Tasigna on 01/02/16. Treatment course complicated by left pleural effusion & pericardial effusion requiring pericardial window in 10/2016. Tasigna restarted on 12/06/16.  CML, BCR-ABL1+: -Most recent BCR-ABL lab evaluation with improving b2a2 and b3a2 transcripts.    -EKG 02/14/17 without QTc prolongation. Then repeat EKG on 05/24/17 revealed prolonged QT/QTc 406/441. Tasigna was placed ON HOLD on 05/24/17 as a result.  -Baseline EKG from 12/2015 reviewed and QT/QTc was 372/429 at that time.  -Resumed tasigna on 06/11/17. EKG performed today and QT/QTc is 406/438 today. QTc prolongation not likely to be solely secondary to Tasigna since we did not see an improvement in her QTc after being off of tasigna for almost a month. -She has an appointment with cardiology on 07/13/17 for further evaluation. Will await to see what they recommend. .   Hypothyroidism:  -Not likely to be secondary to Tasigna. -Defer to Dr. Dorris Fetch for continued management.  Night sweats: -Could be multifactorial, including disease process and treatment. Night sweats do occur in about 12-27% of the time for patients on Tasigna.  Could also be post-menopausal hot flashes/night sweats.   -Patient does not want any medications at this time for her sweats.  Iron deficiency anemia:  -No active bleeding  -Required dose of IV iron on 02/25/17; tolerated well. Iron studies pending. We will keep monitoring.      Dispo:  -Return to cancer center in 2 month for follow-up.  Orders Placed This Encounter  Procedures  . CBC with Differential    Standing Status:   Future    Standing Expiration Date:   07/11/2018  . Comprehensive metabolic panel    Standing Status:   Future    Standing Expiration Date:   07/11/2018  . BCR-ABL1, CML/ALL, PCR, QUANT    Standing Status:   Future    Standing Expiration Date:   07/11/2018     All questions were answered to patient's stated satisfaction. Encouraged patient to call with any new concerns or questions before her next visit to the cancer center and we can certain see her sooner, if needed.    Twana First, MD

## 2017-07-12 LAB — BCR-ABL1, CML/ALL, PCR, QUANT
B2A2 TRANSCRIPT: 0.0376 %
B3A2 TRANSCRIPT: 0.0751 %

## 2017-07-13 ENCOUNTER — Ambulatory Visit: Payer: Medicare Other | Admitting: Cardiovascular Disease

## 2017-07-13 ENCOUNTER — Encounter: Payer: Self-pay | Admitting: Cardiovascular Disease

## 2017-07-13 ENCOUNTER — Encounter: Payer: Medicare Other | Admitting: Cardiovascular Disease

## 2017-07-13 ENCOUNTER — Ambulatory Visit (INDEPENDENT_AMBULATORY_CARE_PROVIDER_SITE_OTHER): Payer: Medicare Other | Admitting: Cardiovascular Disease

## 2017-07-13 VITALS — BP 130/68 | HR 95 | Ht 65.0 in | Wt 207.0 lb

## 2017-07-13 DIAGNOSIS — R9431 Abnormal electrocardiogram [ECG] [EKG]: Secondary | ICD-10-CM | POA: Diagnosis not present

## 2017-07-13 NOTE — Progress Notes (Signed)
CARDIOLOGY CONSULT NOTE  Patient ID: Andrea Martinez MRN: [id number] DOB/AGE: 01-15-1946 71 y.o.  Admit date: (Not on file) Primary Physician: Sinda Du, MD Referring Physician: Renato Battles NP  Reason for Consultation: prolonged QT interval  HPI: Andrea Martinez is a 71 y.o. female who is being seen today for the evaluation of Prolonged QT interval at the request of Holley Bouche, NP.   She has a history of hypothyroidism and CML. Treatment course was, located by a pericardial effusion requiring pericardial window in November 2017 and a large left pleural effusion requiring VATS.   She had been on Tasigna which was held for prolonged QT interval. QTc 429 ms in January 2017. QTc 441 ms on 05/24/2017. QTc 451 ms on 06/10/17. Tasigna was resumed on 06/11/17 and QTC was found to be 438 ms on 07/11/17.  TEE 10/25/16: Normal left ventricular systolic function, LVEF 02-72%, normal regional wall motion, small circumferential pericardial effusion, mildly reduced right ventricular systolic function, large left pleural effusion.  She was also noted to be hypothyroid with a TSH of 40 on 04/26/17. With thyroid hormone supplementation, TSH down to 9.1 on 07/01/17.  She denies chest pain, palpitations, shortness of breath, lightheadedness, and dizziness. Her primary complaint relates to profuse sweating.   Allergies  Allergen Reactions  . Codeine Nausea Only    Headache  . Esomeprazole Magnesium Nausea Only  . Sulfonamide Derivatives Nausea Only    Headache  . Fluconazole Rash    Redness and blistering on left thigh    Current Outpatient Prescriptions  Medication Sig Dispense Refill  . cetirizine (ZYRTEC) 10 MG tablet Take 10 mg by mouth daily.     . Cholecalciferol (VITAMIN D-3) 5000 UNITS TABS Take 5,000 Units by mouth daily.      Marland Kitchen escitalopram (LEXAPRO) 20 MG tablet TAKE 1 AND 1/2 TABLET DAILY 60 tablet 5  . gabapentin (NEURONTIN) 300 MG capsule Take 1 capsule (300 mg total) by  mouth at bedtime. 30 capsule 1  . levothyroxine (SYNTHROID, LEVOTHROID) 125 MCG tablet Take 1 tablet (125 mcg total) by mouth daily before breakfast. 90 tablet 1  . Multiple Vitamins-Minerals (CENTRUM SILVER PO) Take 1 capsule by mouth daily.    . nilotinib (TASIGNA) 150 MG capsule Take 2 capsules (300 mg total) by mouth every 12 (twelve) hours. 120 capsule 11  . traMADol (ULTRAM) 50 MG tablet Take 1 tablet (50 mg total) by mouth every 6 (six) hours as needed. 90 tablet 0  . vitamin B-12 (CYANOCOBALAMIN) 1000 MCG tablet Take 5,000 mcg by mouth daily.      No current facility-administered medications for this visit.     Past Medical History:  Diagnosis Date  . 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 Gastric ulcer 2011   EGD, 5/11  . Hypothyroidism    not on meds, followed by Dr. Elyse Hsu  . Leukocytosis 11/20/2015  . Lipoma    left upper arm  . PONV (postoperative nausea and vomiting)   . UTI (lower urinary tract infection)    frequent    Past Surgical History:  Procedure Laterality Date  . BRAVO [place] STUDY  12/04/2012   Procedure: BRAVO [place];  Surgeon: [name], MD;  [facility];  Service: Endoscopy;  Laterality: N/A;  . CHOLECYSTECTOMY N/A 01/03/2014   Procedure: LAPAROSCOPIC CHOLECYSTECTOMY WITH INTRAOPERATIVE CHOLANGIOGRAM;  Surgeon: Joyice Faster. Cornett, MD;  Location: [place];  Service: General;  Laterality: N/A;  .  COLONOSCOPY  01/2004   [facility], few small tics  . ESOPHAGOGASTRODUODENOSCOPY  02/2010   gastric ulcers  . ESOPHAGOGASTRODUODENOSCOPY  12/04/2012   [id number] gastritis (inflammation) was found in the gastric antrum; multiple biopsies The duodenal mucosa showed no abnormalities in the bulb and second portion of the duodenum  . LIPOMA EXCISION  08/02/2011   left shoulder  . NOSE SURGERY    . PARTIAL HYSTERECTOMY     vaginal at age 58 years of age  . TOE DEBRIDEMENT  1962   lt great toe  .  VIDEO ASSISTED THORACOSCOPY (VATS)/EMPYEMA Left 10/25/2016   Procedure: VIDEO ASSISTED THORACOSCOPY (VATS), BRONCH,DRAINAGE OF PLEURAL EFFUSION,PERICARDIAL WINDOW WITH DRAINAGE OF PERICARDIAL FLUID, TEE;  Surgeon: [name], MD;  Location: [place];  Service: Thoracic;  Laterality: Left;  Marland Kitchen VIDEO BRONCHOSCOPY N/A 10/25/2016   Procedure: VIDEO BRONCHOSCOPY;  Surgeon: [name], MD;  Location: [place];  Service: Thoracic;  Laterality: N/A;    Social History   Social History  . Marital status: Married    Spouse name: N/A  . Number of children: 2  . Years of education: 12th grade   Occupational History  . retired [employer]  .  Retired   Social History Main Topics  . Smoking status: Never Smoker  . Smokeless tobacco: Never Used  . Alcohol use No  . Drug use: No  . Sexual activity: Yes    Partners: Male    Birth control/ protection: None   Other Topics Concern  . Not on file   Social History Narrative  . No narrative on file     No family history of premature CAD in 1st degree relatives.  Current Meds  Medication Sig  . cetirizine (ZYRTEC) 10 MG tablet Take 10 mg by mouth daily.   . Cholecalciferol (VITAMIN D-3) 5000 UNITS TABS Take 5,000 Units by mouth daily.    Marland Kitchen escitalopram (LEXAPRO) 20 MG tablet TAKE 1 AND 1/2 TABLET DAILY  . gabapentin (NEURONTIN) 300 MG capsule Take 1 capsule (300 mg total) by mouth at bedtime.  Marland Kitchen levothyroxine (SYNTHROID, LEVOTHROID) 125 MCG tablet Take 1 tablet (125 mcg total) by mouth daily before breakfast.  . Multiple Vitamins-Minerals (CENTRUM SILVER PO) Take 1 capsule by mouth daily.  . nilotinib (TASIGNA) 150 MG capsule Take 2 capsules (300 mg total) by mouth every 12 (twelve) hours.  . traMADol (ULTRAM) 50 MG tablet Take 1 tablet (50 mg total) by mouth every 6 (six) hours as needed.  . vitamin B-12 (CYANOCOBALAMIN) 1000 MCG tablet Take 5,000 mcg by mouth daily.       Review of systems complete and found to be negative  unless listed above in HPI    Physical exam Blood pressure 130/68, pulse 95, height 5\' 5"  (1.651 m), weight 207 lb (93.9 kg), SpO2 96 %. General: NAD Neck: No JVD, no thyromegaly or thyroid nodule.  Lungs: Clear to auscultation bilaterally with normal respiratory effort. CV: Nondisplaced PMI. Regular rate and rhythm, normal S1/S2, no S3/S4, no murmur.  No peripheral edema.  No carotid bruit.    Abdomen: Soft, nontender, no distention.  Skin: Intact without lesions or rashes.  Neurologic: Alert and oriented x 3.  Psych: Normal affect. Extremities: No clubbing or cyanosis.  HEENT: Normal.   ECG: Most recent ECG reviewed.   Labs: Lab Results  Component Value Date/Time   K 4.1 07/01/2017 11:11 AM   BUN 24 (H) 07/01/2017 11:11 AM   CREATININE 0.91 07/01/2017 11:11 AM  CREATININE 0.87 07/14/2011 09:38 AM   ALT 27 07/01/2017 11:11 AM   TSH 9.178 (H) 07/01/2017 11:11 AM   HGB 12.7 07/01/2017 11:11 AM     Lipids: No results found for: LDLCALC, LDLDIRECT, CHOL, TRIG, HDL      ASSESSMENT AND PLAN:  1. Prolonged QTC: QT intervals are not prolonged to the point of potentially promoting a ventricular arrhythmia. I think she can continue on current medical therapy with serial ECG monitoring. She denies cardiovascular symptoms such as chest pain, palpitations, and shortness of breath.  Disposition: Follow up prn  Time spent: 40 minutes, of which greater than 50% was spent reviewing symptoms, relevant blood tests and studies, and discussing management plan with the patient.   Signed: Kate Sable, M.D., F.A.C.C.  07/13/2017, 8:46 AM

## 2017-07-13 NOTE — Patient Instructions (Signed)
Medication Instructions:  Your physician recommends that you continue on your current medications as directed. Please refer to the Current Medication list given to you today.   Labwork: NONE  Testing/Procedures: NONE  Follow-Up: Your physician recommends that you schedule a follow-up appointment in: AS NEEDED      Any Other Special Instructions Will Be Listed Below (If Applicable).     If you need a refill on your cardiac medications before your next appointment, please call your pharmacy.   

## 2017-07-14 NOTE — Progress Notes (Signed)
This encounter was created in error - please disregard.

## 2017-07-26 ENCOUNTER — Other Ambulatory Visit (HOSPITAL_COMMUNITY): Payer: Self-pay | Admitting: Adult Health

## 2017-07-26 ENCOUNTER — Encounter (HOSPITAL_COMMUNITY): Payer: Self-pay | Admitting: Adult Health

## 2017-07-26 ENCOUNTER — Other Ambulatory Visit (HOSPITAL_COMMUNITY): Payer: Self-pay | Admitting: Emergency Medicine

## 2017-07-26 DIAGNOSIS — C9210 Chronic myeloid leukemia, BCR/ABL-positive, not having achieved remission: Secondary | ICD-10-CM

## 2017-07-26 MED ORDER — TRAMADOL HCL 50 MG PO TABS: 50.0000 mg | ORAL_TABLET | Freq: Four times a day (QID) | ORAL | 0 refills | Status: DC | PRN

## 2017-07-26 NOTE — Progress Notes (Signed)
Patient called cancer center requesting refill of Tramadol.   [place] Controlled Substance Reporting System reviewed and refill is appropriate on or after 07/26/17. Paper prescription printed & post-dated; Rx left at cancer center front desk for patient to retrieve after showing photo ID per clinic policy.   [place] reviewed:     Mike Craze, NP [place] [phone]

## 2017-08-02 ENCOUNTER — Ambulatory Visit
Admission: RE | Admit: 2017-08-02 | Discharge: 2017-08-02 | Disposition: A | Discharge status: Home / Self Care | Payer: Medicare Other | Source: Ambulatory Visit | Attending: Thoracic Surgery (Cardiothoracic Vascular Surgery) | Admitting: Thoracic Surgery (Cardiothoracic Vascular Surgery)

## 2017-08-02 ENCOUNTER — Encounter: Payer: Self-pay | Admitting: Thoracic Surgery (Cardiothoracic Vascular Surgery)

## 2017-08-02 ENCOUNTER — Ambulatory Visit (INDEPENDENT_AMBULATORY_CARE_PROVIDER_SITE_OTHER): Payer: Medicare Other | Admitting: Thoracic Surgery (Cardiothoracic Vascular Surgery)

## 2017-08-02 VITALS — BP 140/77 | HR 80 | Resp 20 | Ht 65.0 in | Wt 208.0 lb

## 2017-08-02 DIAGNOSIS — J90 Pleural effusion, not elsewhere classified: Secondary | ICD-10-CM | POA: Diagnosis not present

## 2017-08-02 DIAGNOSIS — R911 Solitary pulmonary nodule: Secondary | ICD-10-CM | POA: Diagnosis not present

## 2017-08-02 DIAGNOSIS — R918 Other nonspecific abnormal finding of lung field: Secondary | ICD-10-CM | POA: Diagnosis not present

## 2017-08-02 NOTE — Progress Notes (Signed)
[address]       [place], [address]             [phone]    HPI: Andrea Martinez returns for a scheduled follow up visit.  She is a 71 year old woman with a history of CML. I saw her in November 4081 for a complicated left pleural and pericardial effusions. I did a left VATS for drainage of the effusion is on 10/25/2016. She did well postoperatively. A CT of the chest had shown an 8 mm right lower lobe lung nodule.  I saw her back in February and the lung nodule in the right lower lobe as well as several other smaller nodules were unchanged.  The interim since her last visit she has been found to be hypothyroid and has been started on Synthroid. She has not had any chest pain or shortness of breath.  Past Medical History:  Diagnosis Date  . Chronic myelogenous leukemia (CML), BCR-ABL1-positive ([place]) 11/20/2015  . Gastric ulcer 2011   EGD, 5/11  . Hypothyroidism    not on meds, followed by Dr. Elyse Hsu  . Leukocytosis 11/20/2015  . Lipoma    left upper arm  . PONV (postoperative nausea and vomiting)   . UTI (lower urinary tract infection)    frequent    Current Outpatient Prescriptions  Medication Sig Dispense Refill  . cetirizine (ZYRTEC) 10 MG tablet Take 10 mg by mouth daily.     . Cholecalciferol (VITAMIN D-3) 5000 UNITS TABS Take 5,000 Units by mouth daily.      Marland Kitchen escitalopram (LEXAPRO) 20 MG tablet TAKE 1 AND 1/2 TABLET DAILY 60 tablet 5  . gabapentin (NEURONTIN) 300 MG capsule Take 1 capsule (300 mg total) by mouth at bedtime. 30 capsule 1  . levothyroxine (SYNTHROID, LEVOTHROID) 125 MCG tablet Take 1 tablet (125 mcg total) by mouth daily before breakfast. 90 tablet 1  . Multiple Vitamins-Minerals (CENTRUM SILVER PO) Take 1 capsule by mouth daily.    . nilotinib (TASIGNA) 150 MG capsule Take 2 capsules (300 mg total) by mouth every 12 (twelve) hours. 120 capsule 11  . traMADol (ULTRAM) 50 MG tablet Take 1 tablet (50 mg total) by mouth every 6 (six)  hours as needed. 90 tablet 0  . vitamin B-12 (CYANOCOBALAMIN) 1000 MCG tablet Take 1,000 mcg by mouth daily.      No current facility-administered medications for this visit.     Physical Exam BP 140/77   Pulse 80   Resp 20   Ht 5\' 5"  (1.651 m)   Wt 208 lb (94.3 kg)   SpO2 95%   BMI 34.38 kg/m  71 year old woman in no acute distress Alert and oriented 3 with no focal deficits Cardiac regular rate and rhythm normal S1 and S2 Lungs clear with equal breath sounds bilaterally  Diagnostic Tests: CT CHEST WITHOUT CONTRAST  TECHNIQUE: Multidetector CT imaging of the chest was performed following the standard protocol without IV contrast.  COMPARISON:  01/18/2017 and 10/23/2016  FINDINGS: Cardiovascular: The heart is normal in size. No pericardial effusion.  No evidence of thoracic aortic aneurysm.  Mediastinum/Nodes: Small mediastinal lymph nodes which do not meet pathologic CT size criteria.  Visualized thyroid is unremarkable.  Lungs/Pleura: 4 mm nodule in the lateral right upper lobe (series 4/ image 41), unchanged. 3 mm nodule in the medial left upper lobe (series 4/ image 46), unchanged. Post of the characteristic appearance of benign subpleural lymph nodes.  Additional 7 mm subpleural  nodule in the lateral right lower lobe (series 4/ image 85), unchanged.  No focal consolidation. Mild linear scarring in the right middle lobe and lingula. Mild subpleural reticulation in the left lower lobe.  No pleural effusion or pneumothorax.  Upper Abdomen: Visualized upper abdomen is unremarkable.  Musculoskeletal: Degenerative changes of the thoracic spine.  IMPRESSION: Small bilateral pulmonary nodules, including a dominant 7 mm nodule in the right lower lobe, unchanged.  9 month stability has been demonstrated. Consider a single follow-up CT chest in 12 months as clinically warranted.  This recommendation follows the consensus statement: Guidelines  for Management of Small Pulmonary Nodules Detected on CT Images: From the Fleischner Society 2017; Radiology 2017; 284:228-243.   Electronically Signed   By: Julian Hy M.D.   On: 08/02/2017 12:28 I personally reviewed the CT chest and concur with the findings noted above  Impression: Andrea Martinez is a 71 year old woman with CML who had a complex loculated pleural effusion back in November 2017. CTs that time showed a right lower lobe lung nodule. Subsequent CT showed multiple other small subpleural lung nodules, which are likely subpleural lymph nodes.  Her lung nodules are unchanged over the past year. To be on the safe side, we will get one more a CT in the year to make sure there is no change in the nodules.  Hypothyroidism- on Synthroid  CML- on Tasigna  Plan:  Return in one year with CT chest  [name], MD [facility] [phone]

## 2017-08-05 DIAGNOSIS — M170 Bilateral primary osteoarthritis of knee: Secondary | ICD-10-CM | POA: Diagnosis not present

## 2017-08-11 DIAGNOSIS — M170 Bilateral primary osteoarthritis of knee: Secondary | ICD-10-CM | POA: Diagnosis not present

## 2017-08-12 ENCOUNTER — Ambulatory Visit: Payer: Medicare Other | Admitting: Cardiovascular Disease

## 2017-08-18 DIAGNOSIS — M170 Bilateral primary osteoarthritis of knee: Secondary | ICD-10-CM | POA: Diagnosis not present

## 2017-09-13 ENCOUNTER — Encounter (HOSPITAL_COMMUNITY): Payer: Medicare Other

## 2017-09-13 ENCOUNTER — Encounter (HOSPITAL_COMMUNITY): Payer: Self-pay | Admitting: Oncology

## 2017-09-13 ENCOUNTER — Encounter (HOSPITAL_COMMUNITY): Payer: Medicare Other | Attending: Adult Health | Admitting: Oncology

## 2017-09-13 VITALS — BP 122/66 | HR 80 | Temp 98.3°F | Resp 18 | Ht 65.0 in | Wt 213.0 lb

## 2017-09-13 DIAGNOSIS — Z79891 Long term (current) use of opiate analgesic: Secondary | ICD-10-CM | POA: Diagnosis not present

## 2017-09-13 DIAGNOSIS — Z9049 Acquired absence of other specified parts of digestive tract: Secondary | ICD-10-CM | POA: Diagnosis not present

## 2017-09-13 DIAGNOSIS — Z87440 Personal history of urinary (tract) infections: Secondary | ICD-10-CM | POA: Insufficient documentation

## 2017-09-13 DIAGNOSIS — Z8719 Personal history of other diseases of the digestive system: Secondary | ICD-10-CM | POA: Diagnosis not present

## 2017-09-13 DIAGNOSIS — C9210 Chronic myeloid leukemia, BCR/ABL-positive, not having achieved remission: Secondary | ICD-10-CM | POA: Diagnosis not present

## 2017-09-13 DIAGNOSIS — Z885 Allergy status to narcotic agent status: Secondary | ICD-10-CM | POA: Insufficient documentation

## 2017-09-13 DIAGNOSIS — Z801 Family history of malignant neoplasm of trachea, bronchus and lung: Secondary | ICD-10-CM | POA: Insufficient documentation

## 2017-09-13 DIAGNOSIS — R0602 Shortness of breath: Secondary | ICD-10-CM | POA: Insufficient documentation

## 2017-09-13 DIAGNOSIS — Z23 Encounter for immunization: Secondary | ICD-10-CM | POA: Diagnosis not present

## 2017-09-13 DIAGNOSIS — Z882 Allergy status to sulfonamides status: Secondary | ICD-10-CM | POA: Diagnosis not present

## 2017-09-13 DIAGNOSIS — Z9221 Personal history of antineoplastic chemotherapy: Secondary | ICD-10-CM | POA: Diagnosis not present

## 2017-09-13 DIAGNOSIS — Z9889 Other specified postprocedural states: Secondary | ICD-10-CM | POA: Insufficient documentation

## 2017-09-13 DIAGNOSIS — Z888 Allergy status to other drugs, medicaments and biological substances status: Secondary | ICD-10-CM | POA: Insufficient documentation

## 2017-09-13 DIAGNOSIS — Z79899 Other long term (current) drug therapy: Secondary | ICD-10-CM | POA: Insufficient documentation

## 2017-09-13 DIAGNOSIS — E039 Hypothyroidism, unspecified: Secondary | ICD-10-CM | POA: Insufficient documentation

## 2017-09-13 DIAGNOSIS — Z90710 Acquired absence of both cervix and uterus: Secondary | ICD-10-CM | POA: Insufficient documentation

## 2017-09-13 DIAGNOSIS — Z825 Family history of asthma and other chronic lower respiratory diseases: Secondary | ICD-10-CM | POA: Insufficient documentation

## 2017-09-13 DIAGNOSIS — R9431 Abnormal electrocardiogram [ECG] [EKG]: Secondary | ICD-10-CM | POA: Diagnosis not present

## 2017-09-13 LAB — COMPREHENSIVE METABOLIC PANEL
ALBUMIN: 3.9 g/dL (ref 3.5–5.0)
ALT: 28 U/L (ref 14–54)
ANION GAP: 9 (ref 5–15)
AST: 31 U/L (ref 15–41)
Alkaline Phosphatase: 177 U/L — ABNORMAL HIGH (ref 38–126)
BUN: 27 mg/dL — ABNORMAL HIGH (ref 6–20)
CALCIUM: 8.8 mg/dL — AB (ref 8.9–10.3)
CHLORIDE: 104 mmol/L (ref 101–111)
CO2: 26 mmol/L (ref 22–32)
Creatinine, Ser: 0.86 mg/dL (ref 0.44–1.00)
GFR calc non Af Amer: >60 mL/min (ref >60)
Glucose, Bld: 102 mg/dL — ABNORMAL HIGH (ref 65–99)
POTASSIUM: 4.5 mmol/L (ref 3.5–5.1)
SODIUM: 139 mmol/L (ref 135–145)
Total Bilirubin: 0.8 mg/dL (ref 0.3–1.2)
Total Protein: 7.4 g/dL (ref 6.5–8.1)

## 2017-09-13 LAB — CBC WITH DIFFERENTIAL/PLATELET
BASOS PCT: 0 %
Basophils Absolute: 0 10*3/uL (ref 0.0–0.1)
EOS ABS: 0.2 10*3/uL (ref 0.0–0.7)
EOS PCT: 2 %
HCT: 36.7 % (ref 36.0–46.0)
Hemoglobin: 11.8 g/dL — ABNORMAL LOW (ref 12.0–15.0)
LYMPHS ABS: 1.4 10*3/uL (ref 0.7–4.0)
Lymphocytes Relative: 15 %
MCH: 29.6 pg (ref 26.0–34.0)
MCHC: 32.2 g/dL (ref 30.0–36.0)
MCV: 92 fL (ref 78.0–100.0)
MONOS PCT: 7 %
Monocytes Absolute: 0.6 10*3/uL (ref 0.1–1.0)
Neutro Abs: 7.2 10*3/uL (ref 1.7–7.7)
Neutrophils Relative %: 76 %
PLATELETS: 237 10*3/uL (ref 150–400)
RBC: 3.99 MIL/uL (ref 3.87–5.11)
RDW: 13.1 % (ref 11.5–15.5)
WBC: 9.5 10*3/uL (ref 4.0–10.5)

## 2017-09-13 MED ORDER — INFLUENZA VAC SPLIT QUAD 0.5 ML IM SUSY
PREFILLED_SYRINGE | INTRAMUSCULAR | Status: AC
  Filled 2017-09-13: qty 0.5

## 2017-09-13 MED ORDER — INFLUENZA VAC SPLIT QUAD 0.5 ML IM SUSY
0.5000 mL | PREFILLED_SYRINGE | Freq: Once | INTRAMUSCULAR | Status: AC
  Administered 2017-09-13: 0.5 mL via INTRAMUSCULAR

## 2017-09-13 MED ORDER — TRAMADOL HCL 50 MG PO TABS: 50.0000 mg | ORAL_TABLET | Freq: Four times a day (QID) | ORAL | 0 refills | Status: DC | PRN

## 2017-09-13 NOTE — Progress Notes (Signed)
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 Florene Glen [facility]      11/21/2015 -  Chemotherapy    Hydrea 1000 mg and Allopurinol 300 mg daily (due to high uric acid level)      11/21/2015 Tumor Marker    BCR P210 Ratio of Fusion to Control, Blood 0.8941    P210 IS % Ratio, Blood 85.834%        01/02/2016 - 10/23/2016 Chemotherapy    Tasigna      01/08/2016 Miscellaneous    EKG- QT/QTc: 382/440 ms      01/16/2016 Miscellaneous    EKG- QT/QTc: 364/419      03/30/2016 Tumor Marker    b2a2 transcript: 0.317 b3a2 transcript: 0.194      10/23/2016 - 11/05/2016 Hospital Admission    large Left Pleural Effusion and Pericardial Effusion that is now s/p VATS with Drainage, and Drainage of Pericardial Fluid with Window and Biopsy of Pericardium      12/06/2016 -  Chemotherapy    Tasigna restarted       05/24/2017 Adverse Reaction    Prolonged QTc on EKG; likely d/t chemo. Tasigna ON HOLD until next visit.         INTERVAL HISTORY:  Andrea Martinez 71 y.o. female returns for follow-up for CML.   She presents today for continued follow-up of her CML. Since her last visit she's seen cardiology on 07/13/2017 for her prolonged QTc. Per Dr. Bronson Ing, patient's QT intervals are not prolonged to the point of potentially promoting a ventricular arrhythmia. He thinks she can continue on current medical therapy for her CML with serial ECG monitoring. Her last quantitative BCR-ABL from 07/12/17 demonstrates POSITIVE for the BCR-ABL1 e13b2 (b2a2, p210) and  e14a2 (b3a2, p210)  fusion transcripts at <0.0032%.  Patient states that she has been having problems with her hypothyroidism and is getting the dose of her Synthroid adjusted. She has chronic fatigue from her hypothyroidism. She also has been having hot flashes and she states she's been having a rash on her chest from all the sweating. She is been taking her to take tasigna without any side effects that she is noted. She denies any increased swelling, diarrhea, chest pain, shortness of breath. She complains of thick calluses on her feet. She has also been having left ear pain.  REVIEW OF SYSTEMS:  Review of Systems  Constitutional: Positive for fatigue. Negative for chills, diaphoresis and fever.  HENT:  Negative.  Negative for lump/mass and nosebleeds.   Eyes: Negative.   Respiratory: Negative.  Negative for cough and shortness of breath.   Cardiovascular: Negative for chest pain and leg swelling.  Gastrointestinal: Negative.  Negative for abdominal pain, blood in stool, constipation, diarrhea, nausea and vomiting.  Endocrine: Negative.   Genitourinary: Negative.  Negative for dysuria and hematuria.   Musculoskeletal: Negative for arthralgias.  Skin: Positive for rash.  Neurological: Negative.  Negative for dizziness and headaches.  Hematological: Negative.  Negative for adenopathy. Does not bruise/bleed easily.  Psychiatric/Behavioral: Negative for depression and sleep disturbance. The  patient is not nervous/anxious.      PAST MEDICAL/SURGICAL HISTORY:  Past Medical History:  Diagnosis Date  . 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 Gastric ulcer 2011   EGD, 5/11  . Hypothyroidism    not on meds, followed by Dr. Elyse Hsu  . Leukocytosis 11/20/2015  . Lipoma    left upper arm  . PONV (postoperative nausea and vomiting)   . UTI (lower urinary tract infection)    frequent   Past Surgical History:  Procedure Laterality Date  . BRAVO [place] STUDY   12/04/2012   Procedure: BRAVO [place];  Surgeon: [name], MD;  [facility];  Service: Endoscopy;  Laterality: N/A;  . CHOLECYSTECTOMY N/A 01/03/2014   Procedure: LAPAROSCOPIC CHOLECYSTECTOMY WITH INTRAOPERATIVE CHOLANGIOGRAM;  Surgeon: Joyice Faster. Cornett, MD;  Location: [place];  Service: General;  Laterality: N/A;  . COLONOSCOPY  01/2004   DR [facility], few small tics  . ESOPHAGOGASTRODUODENOSCOPY  02/2010   gastric ulcers  . ESOPHAGOGASTRODUODENOSCOPY  12/04/2012   [name] gastritis (inflammation) was found in the gastric antrum; multiple biopsies The duodenal mucosa showed no abnormalities in the bulb and second portion of the duodenum  . LIPOMA EXCISION  08/02/2011   left shoulder  . NOSE SURGERY    . PARTIAL HYSTERECTOMY     vaginal at age 60 years of age  . TOE DEBRIDEMENT  1962   lt great toe  . VIDEO ASSISTED THORACOSCOPY (VATS)/EMPYEMA Left 10/25/2016   Procedure: VIDEO ASSISTED THORACOSCOPY (VATS), BRONCH,DRAINAGE OF PLEURAL EFFUSION,PERICARDIAL WINDOW WITH DRAINAGE OF PERICARDIAL FLUID, TEE;  Surgeon: [name], MD;  Location: [place];  Service: Thoracic;  Laterality: Left;  Marland Kitchen VIDEO BRONCHOSCOPY N/A 10/25/2016   Procedure: VIDEO BRONCHOSCOPY;  Surgeon: [name], MD;  Location: [place];  Service: Thoracic;  Laterality: N/A;     SOCIAL HISTORY:  Social History   Social History  . Marital status: Married    Spouse name: N/A  . Number of children: 2  . Years of education: 12th grade   Occupational History  . retired [employer]  .  Retired   Social History Main Topics  . Smoking status: Never Smoker  . Smokeless tobacco: Never Used  . Alcohol use No  . Drug use: No  . Sexual activity: Yes    Partners: Male    Birth control/ protection: None   Other Topics Concern  . Not on file   Social History Narrative  . No narrative on file    FAMILY HISTORY:  Family History  Problem Relation Age of Onset  . Lung  cancer Father   . COPD Mother   . Colon cancer Neg Hx   . Anesthesia problems Neg Hx   . Hypotension Neg Hx   . Malignant hyperthermia Neg Hx   . Pseudochol deficiency Neg Hx     CURRENT MEDICATIONS:  Outpatient Encounter Prescriptions as of 09/13/2017  Medication Sig  . cetirizine (ZYRTEC) 10 MG tablet Take 10 mg by mouth daily.   . Cholecalciferol (VITAMIN D-3) 5000 UNITS TABS Take 5,000 Units by mouth daily.    Marland Kitchen escitalopram (LEXAPRO) 20 MG tablet TAKE 1 AND 1/2 TABLET DAILY  . gabapentin (NEURONTIN) 300 MG capsule Take 1 capsule (300 mg total) by mouth at bedtime.  Marland Kitchen levothyroxine (SYNTHROID, LEVOTHROID) 125 MCG tablet Take 1 tablet (125 mcg total) by mouth daily before breakfast.  . Multiple Vitamins-Minerals (CENTRUM SILVER PO) Take 1 capsule by mouth daily.  Marland Kitchen  nilotinib (TASIGNA) 150 MG capsule Take 2 capsules (300 mg total) by mouth every 12 (twelve) hours.  . traMADol (ULTRAM) 50 MG tablet Take 1 tablet (50 mg total) by mouth every 6 (six) hours as needed.  . vitamin B-12 (CYANOCOBALAMIN) 1000 MCG tablet Take 1,000 mcg by mouth daily.    No facility-administered encounter medications on file as of 09/13/2017.      ALLERGIES:  Allergies  Allergen Reactions  . Codeine Nausea Only    Headache  . Esomeprazole Magnesium Nausea Only  . Sulfonamide Derivatives Nausea Only    Headache  . Fluconazole Rash    Redness and blistering on left thigh      PHYSICAL EXAM:  ECOG Performance status: 1 - Symptomatic, but independent   There were no vitals filed for this visit.  There were no vitals filed for this visit.   Physical Exam  Constitutional: She is oriented to person, place, and time and well-developed, well-nourished, and in no distress.  HENT:  Head: Normocephalic.  Mouth/Throat: Oropharynx is clear and moist. No oropharyngeal exudate.  Eyes: Pupils are equal, round, and reactive to light. Conjunctivae are normal. No scleral icterus.  Neck: Normal range of  motion. Neck supple.  Cardiovascular: Normal rate, regular rhythm and normal heart sounds.   Pulmonary/Chest: Effort normal and breath sounds normal. No respiratory distress.  Abdominal: Soft. Bowel sounds are normal. There is no tenderness. There is no rebound and no guarding.  Musculoskeletal: She exhibits no edema or tenderness.  Lymphadenopathy:    She has no cervical adenopathy.       Right: No supraclavicular adenopathy present.       Left: No supraclavicular adenopathy present.  Neurological: She is alert and oriented to person, place, and time. No cranial nerve deficit. Gait normal.  Skin: Skin is warm and dry. No rash (small red rashes on her chest wall) noted.  Psychiatric: Mood, memory, affect and judgment normal.  Nursing note and vitals reviewed.    LABORATORY DATA:  I have reviewed the labs as listed.  CBC    Component Value Date/Time   WBC 9.5 09/13/2017 1040   RBC 3.99 09/13/2017 1040   HGB 11.8 (L) 09/13/2017 1040   HCT 36.7 09/13/2017 1040   PLT 237 09/13/2017 1040   MCV 92.0 09/13/2017 1040   MCH 29.6 09/13/2017 1040   MCHC 32.2 09/13/2017 1040   RDW 13.1 09/13/2017 1040   LYMPHSABS 1.4 09/13/2017 1040   MONOABS 0.6 09/13/2017 1040   EOSABS 0.2 09/13/2017 1040   BASOSABS 0.0 09/13/2017 1040   CMP Latest Ref Rng & Units 09/13/2017 07/01/2017 06/10/2017  Glucose 65 - 99 mg/dL 102(H) 91 97  BUN 6 - 20 mg/dL 27(H) 24(H) 26(H)  Creatinine 0.44 - 1.00 mg/dL 0.86 0.91 0.96  Sodium 135 - 145 mmol/L 139 139 140  Potassium 3.5 - 5.1 mmol/L 4.5 4.1 4.3  Chloride 101 - 111 mmol/L 104 102 102  CO2 22 - 32 mmol/L 26 28 30   Calcium 8.9 - 10.3 mg/dL 8.8(L) 9.1 9.5  Total Protein 6.5 - 8.1 g/dL 7.4 7.6 7.4  Total Bilirubin 0.3 - 1.2 mg/dL 0.8 0.8 0.5  Alkaline Phos 38 - 126 U/L 177(H) 161(H) 136(H)  AST 15 - 41 U/L 31 34 36  ALT 14 - 54 U/L 28 27 25       b2a2 transcript % 0.0376  0.0336  0.1255  0.348     b3a2 transcript % 0.0751  0.0552  0.1004  CommentCM  E1A2 Transcript % Comment  CommentCM  CommentCM  CommentCM         DIAGNOSTIC IMAGING:  I have reviewed & agree with the reported results. CT chest: 10/23/16    Chest X-ray: 12/31/16      PATHOLOGY:      EKG:  Most recent EKG: 05/24/17    Baseline EKG: 01/08/16        ASSESSMENT & PLAN:  Andrea Martinez is a pleasant 71 y.o. female with CML, BCR-ABL1+; started on Tasigna on 01/02/16. Treatment course complicated by left pleural effusion & pericardial effusion requiring pericardial window in 10/2016. Tasigna restarted on 12/06/16.    CML, BCR-ABL1+: -Most recent BCR-ABL lab evaluation with improving b2a2 and b3a2 transcripts.  -EKG 02/14/17 without QTc prolongation. Then repeat EKG on 05/24/17 revealed prolonged QT/QTc 406/441. Tasigna was placed ON HOLD on 05/24/17 as a result.  -Baseline EKG from 12/2015 reviewed and QT/QTc was 372/429 at that time.  -Resumed tasigna on 06/11/17.  -Patient has been evaluated by cardiology who stated that it was safe for her to resume her tasigna. EKG performed today and QT/QTc is 392/425 ms  Today, improved from July 2018. Prolonged QTc could be due to her underlying uncontrolled hypothyroidism for the past few months.  -Will check serial EKGs q3 months. -Reviewed her CBC and CMP in detail with her today. She has a hematologic remission however have not achieved a molecular remission. -Refilled her tramadol today.    Hypothyroidism:  -Not likely to be secondary to Tasigna. -Defer to Dr. Dorris Fetch for continued management.  Dispo:  -Return to cancer center in 3 month for follow-up with labs below and EKG.   Orders Placed This Encounter  Procedures  . CBC with Differential    Standing Status:   Future    Standing Expiration Date:   09/13/2018  . Comprehensive metabolic panel    Standing Status:   Future    Standing Expiration Date:   09/13/2018  . BCR-ABL1, CML/ALL, PCR, QUANT    Standing Status:   Future    Standing Expiration Date:    09/13/2018  . EKG 12-Lead    Standing Status:   Standing    Number of Occurrences:   3    Standing Expiration Date:   03/15/2019    Order Specific Question:   Where should this test be performed    Answer:   [facility]     All questions were answered to patient's stated satisfaction. Encouraged patient to call with any new concerns or questions before her next visit to the cancer center and we can certain see her sooner, if needed.    Twana First, MD

## 2017-09-13 NOTE — Progress Notes (Signed)
Pt given flu vaccine. Pt given injection in left deltoid. Pt tolerated injection well. Pt stable and discharged home ambulatory.

## 2017-09-19 LAB — BCR-ABL1, CML/ALL, PCR, QUANT
B2A2 TRANSCRIPT: 0.0606 %
B3A2 TRANSCRIPT: 0.109 %

## 2017-09-29 DIAGNOSIS — M1711 Unilateral primary osteoarthritis, right knee: Secondary | ICD-10-CM | POA: Diagnosis not present

## 2017-09-29 DIAGNOSIS — M79671 Pain in right foot: Secondary | ICD-10-CM | POA: Diagnosis not present

## 2017-09-29 DIAGNOSIS — M1712 Unilateral primary osteoarthritis, left knee: Secondary | ICD-10-CM | POA: Diagnosis not present

## 2017-10-03 DIAGNOSIS — E039 Hypothyroidism, unspecified: Secondary | ICD-10-CM | POA: Diagnosis not present

## 2017-10-03 LAB — TSH: TSH: 2.38 mIU/L (ref 0.40–4.50)

## 2017-10-03 LAB — T4, FREE: FREE T4: 1.5 ng/dL (ref 0.8–1.8)

## 2017-10-11 ENCOUNTER — Ambulatory Visit (INDEPENDENT_AMBULATORY_CARE_PROVIDER_SITE_OTHER): Payer: Medicare Other | Admitting: "Endocrinology

## 2017-10-11 ENCOUNTER — Encounter: Payer: Self-pay | Admitting: "Endocrinology

## 2017-10-11 ENCOUNTER — Other Ambulatory Visit: Payer: Self-pay

## 2017-10-11 VITALS — BP 122/81 | HR 78 | Ht 65.0 in | Wt 215.0 lb

## 2017-10-11 DIAGNOSIS — L749 Eccrine sweat disorder, unspecified: Secondary | ICD-10-CM | POA: Insufficient documentation

## 2017-10-11 DIAGNOSIS — E039 Hypothyroidism, unspecified: Secondary | ICD-10-CM

## 2017-10-11 NOTE — Progress Notes (Signed)
Subjective:    Patient ID: Andrea Martinez, female    DOB: [birth year], PCP Sinda Du, MD   Past Medical History:  Diagnosis Date  . 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 Gastric ulcer 2011   EGD, 5/11  . Hypothyroidism    not on meds, followed by Dr. Elyse Hsu  . Leukocytosis 11/20/2015  . Lipoma    left upper arm  . PONV (postoperative nausea and vomiting)   . UTI (lower urinary tract infection)    frequent   Past Surgical History:  Procedure Laterality Date  . BRAVO [place] STUDY  12/04/2012   Procedure: BRAVO [place];  Surgeon: [name], MD;  [facility];  Service: Endoscopy;  Laterality: N/A;  . CHOLECYSTECTOMY N/A 01/03/2014   Procedure: LAPAROSCOPIC CHOLECYSTECTOMY WITH INTRAOPERATIVE CHOLANGIOGRAM;  Surgeon: Joyice Faster. Cornett, MD;  Location: [place];  Service: General;  Laterality: N/A;  . COLONOSCOPY  01/2004   DR [facility], few small tics  . ESOPHAGOGASTRODUODENOSCOPY  02/2010   gastric ulcers  . ESOPHAGOGASTRODUODENOSCOPY  12/04/2012   [id number]:[id number] gastritis (inflammation) was found in the gastric antrum; multiple biopsies The duodenal mucosa showed no abnormalities in the bulb and second portion of the duodenum  . LIPOMA EXCISION  08/02/2011   left shoulder  . NOSE SURGERY    . PARTIAL HYSTERECTOMY     vaginal at age 29 years of age  . TOE DEBRIDEMENT  1962   lt great toe  . VIDEO ASSISTED THORACOSCOPY (VATS)/EMPYEMA Left 10/25/2016   Procedure: VIDEO ASSISTED THORACOSCOPY (VATS), BRONCH,DRAINAGE OF PLEURAL EFFUSION,PERICARDIAL WINDOW WITH DRAINAGE OF PERICARDIAL FLUID, TEE;  Surgeon: [name], MD;  Location: [place] [facility];  Service: Thoracic;  Laterality: Left;  Marland Kitchen VIDEO BRONCHOSCOPY N/A 10/25/2016   Procedure: VIDEO BRONCHOSCOPY;  Surgeon: [name], MD;  Location: [place];  Service: Thoracic;  Laterality: N/A;   Social History   Social History  . Marital status: Married     Spouse name: N/A  . Number of children: 2  . Years of education: 12th grade   Occupational History  . retired [employer]  .  Retired   Social History Main Topics  . Smoking status: Never Smoker  . Smokeless tobacco: Never Used  . Alcohol use No  . Drug use: No  . Sexual activity: Yes    Partners: Male    Birth control/ protection: None   Other Topics Concern  . None   Social History Narrative  . None   Outpatient Encounter Prescriptions as of 10/11/2017  Medication Sig  . cetirizine (ZYRTEC) 10 MG tablet Take 10 mg by mouth daily.   . Cholecalciferol (VITAMIN D-3) 5000 UNITS TABS Take 5,000 Units by mouth daily.    Marland Kitchen escitalopram (LEXAPRO) 20 MG tablet TAKE 1 AND 1/2 TABLET DAILY  . gabapentin (NEURONTIN) 300 MG capsule Take 1 capsule (300 mg total) by mouth at bedtime.  Marland Kitchen levothyroxine (SYNTHROID, LEVOTHROID) 125 MCG tablet Take 1 tablet (125 mcg total) by mouth daily before breakfast.  . Multiple Vitamins-Minerals (CENTRUM SILVER PO) Take 1 capsule by mouth daily.  . nilotinib (TASIGNA) 150 MG capsule Take 2 capsules (300 mg total) by mouth every 12 (twelve) hours.  . traMADol (ULTRAM) 50 MG tablet Take 1 tablet (50 mg total) by mouth every 6 (six) hours as needed.  . vitamin B-12 (CYANOCOBALAMIN) 1000 MCG tablet Take 1,000 mcg by mouth daily.    No facility-administered encounter medications on file as  of 10/11/2017.    ALLERGIES: Allergies  Allergen Reactions  . Codeine Nausea Only    Headache  . Esomeprazole Magnesium Nausea Only  . Sulfonamide Derivatives Nausea Only    Headache  . Fluconazole Rash    Redness and blistering on left thigh    VACCINATION STATUS: Immunization History  Administered Date(s) Administered  . Influenza,inj,Quad PF,6+ Mos 09/13/2017  . Influenza-Unspecified 09/24/2015    HPI 71 year old female patient with medical history as above. She is being seen in f/u  for hypothyroidism . -She is currently on thyroxine 125 g by mouth every  morning. She reports compliance to this medication.  - She still  complains of progressive weight gain, fatigue, sweating.  she is on medications for CML- with nilotinib. -She has family history of thyroid problem involving goiter which required thyroidectomy in her mother who took thyroid hormone replacement subsequently. -She denies dysphagia, shortness of breath, voice change. -She is compliant to her medications. She is taking  Review of Systems  Constitutional: + weight gain, + fatigue, + subjective hyperthermia.  Eyes: no blurry vision, no xerophthalmia ENT: no sore throat, no nodules palpated in throat, no dysphagia/odynophagia, no hoarseness Cardiovascular: no Chest Pain, no Shortness of Breath, no palpitations, no leg swelling Respiratory: no cough, no SOB Gastrointestinal: no Nausea/Vomiting/Diarhhea Musculoskeletal: no muscle/joint aches Skin: no rashes Neurological: + tremors, no numbness, no tingling, no dizziness Psychiatric: no depression, no anxiety  Objective:    BP 122/81   Pulse 78   Ht 5\' 5"  (1.651 m)   Wt 215 lb (97.5 kg)   BMI 35.78 kg/m   Wt Readings from Last 3 Encounters:  10/11/17 215 lb (97.5 kg)  09/13/17 213 lb (96.6 kg)  08/02/17 208 lb (94.3 kg)    Physical Exam  Constitutional:  Obese,  not in acute distress, normal state of mind Eyes: PERRLA, EOMI, no exophthalmos ENT: moist mucous membranes, no thyromegaly, no cervical lymphadenopathy Cardiovascular: normal precordial activity, Regular Rate and Rhythm, no Murmur/Rubs/Gallops Respiratory:  adequate breathing efforts, no gross chest deformity, Clear to auscultation bilaterally Gastrointestinal: abdomen soft, Non -tender, No distension, Bowel Sounds present Musculoskeletal: no gross deformities, strength intact in all four extremities Skin: moist, warm, no rashes Neurological: + Mild tremor with outstretched hands, Deep tendon reflexes normal in all four extremities.  CMP ( most  recent) CMP     Component Value Date/Time   NA 139 09/13/2017 1040   K 4.5 09/13/2017 1040   CL 104 09/13/2017 1040   CO2 26 09/13/2017 1040   GLUCOSE 102 (H) 09/13/2017 1040   BUN 27 (H) 09/13/2017 1040   CREATININE 0.86 09/13/2017 1040   CREATININE 0.87 07/14/2011 0938   CALCIUM 8.8 (L) 09/13/2017 1040   PROT 7.4 09/13/2017 1040   ALBUMIN 3.9 09/13/2017 1040   AST 31 09/13/2017 1040   ALT 28 09/13/2017 1040   ALKPHOS 177 (H) 09/13/2017 1040   BILITOT 0.8 09/13/2017 1040   GFRNONAA >60 09/13/2017 1040   GFRAA >60 09/13/2017 1040     Diabetic Labs (most recent): Lab Results  Component Value Date   HGBA1C 5.3 10/28/2016   Results for DORIEN, BESSENT (MRN [id number]) as of 10/11/2017 15:57  Ref. Range 07/01/2017 11:11 10/03/2017 14:33  TSH Latest Ref Range: 0.40 - 4.50 mIU/L 9.178 (H) 2.38  Triiodothyronine (T3) Latest Ref Range: 71 - 180 ng/dL 91   T4,Free(Direct) Latest Ref Range: 0.8 - 1.8 ng/dL 0.99 1.5   Review of her prior chest x-ray showed evidence of  COPD  Assessment & Plan:   1. Hypothyroidism - Her thyroid function tests are consistent with appropriate replacement.  - I advised her to continue on levothyroxine 125 g by mouth every morning.   - We discussed about correct intake of levothyroxine, at fasting, with water, separated by at least 30 minutes from breakfast, and separated by more than 4 hours from calcium, iron, multivitamins, acid reflux medications (PPIs). -Patient is made aware of the fact that thyroid hormone replacement is needed for life, dose to be adjusted by periodic monitoring of thyroid function tests.  - Her clinical exam is negative for goiter, hence no need for imaging of the thyroid at this time.  Given her complaint of abnormal, episodic upper body sweating with no clear prior diagnosis, I offered her 24-hour urine test for 5-HIAA, metanephrines, and catecholamines.  - I advised patient to maintain close follow up with Sinda Du,  MD for primary care needs. Follow up plan: Return in about 3 months (around 01/11/2018) for follow up with pre-visit labs.  Glade Lloyd, MD Phone: [phone]  Fax: [phone]  -  This note was partially dictated with voice recognition software. Similar sounding words can be transcribed inadequately or may not  be corrected upon review.  10/11/2017, 4:18 PM

## 2017-10-13 DIAGNOSIS — M7751 Other enthesopathy of right foot: Secondary | ICD-10-CM | POA: Diagnosis not present

## 2017-10-13 DIAGNOSIS — M79671 Pain in right foot: Secondary | ICD-10-CM | POA: Diagnosis not present

## 2017-10-17 DIAGNOSIS — L749 Eccrine sweat disorder, unspecified: Secondary | ICD-10-CM | POA: Diagnosis not present

## 2017-10-24 LAB — 5 HIAA W/CREATININE, 24 HR
5-HIAA: 3.3 mg/24 h (ref <=6.0)
CREATININE 24 HR UR-5 HIAA W/CREAT: 0.79 g/(24.h) (ref 0.50–2.15)
Volume, Urine-VMAUR: 1300 mL

## 2017-10-24 LAB — CATECHOLAMINES, FRACTIONATED, URINE, 24 HOUR
Creatinine, Urine mg/day-CATEUR: 0.79 g/(24.h) (ref 0.50–2.15)
Volume, Urine-VMAUR: 1300 mL

## 2017-10-24 LAB — METANEPHRINES, URINE, 24 HOUR
Metaneph Total, Ur: 308 mcg/24 h (ref 224–832)
Metanephrines, Ur: 109 mcg/24 h (ref 90–315)
NORMETANEPHRINE 24H UR: 199 ug/(24.h) (ref 122–676)
Volume, Urine-VMAUR: 1300 mL

## 2017-11-11 ENCOUNTER — Other Ambulatory Visit (HOSPITAL_COMMUNITY): Payer: Self-pay

## 2017-11-11 DIAGNOSIS — C9210 Chronic myeloid leukemia, BCR/ABL-positive, not having achieved remission: Secondary | ICD-10-CM

## 2017-11-11 MED ORDER — TRAMADOL HCL 50 MG PO TABS: 50.0000 mg | ORAL_TABLET | Freq: Four times a day (QID) | ORAL | 0 refills | Status: DC | PRN

## 2017-11-11 NOTE — Telephone Encounter (Signed)
Received refill request from patient for Tramadol. Reviewed with provider, chart checked and refilled.

## 2017-11-18 ENCOUNTER — Telehealth (HOSPITAL_COMMUNITY): Payer: Self-pay | Admitting: Oncology

## 2017-11-18 DIAGNOSIS — M79671 Pain in right foot: Secondary | ICD-10-CM | POA: Diagnosis not present

## 2017-11-18 DIAGNOSIS — M7751 Other enthesopathy of right foot: Secondary | ICD-10-CM | POA: Diagnosis not present

## 2017-11-18 NOTE — Telephone Encounter (Signed)
FAXED NEW APP AND FIN INFO TO [facility] FOR TASIGNA ON 11/10/17

## 2017-11-28 ENCOUNTER — Other Ambulatory Visit (HOSPITAL_COMMUNITY): Payer: Self-pay | Admitting: Pulmonary Disease

## 2017-11-28 DIAGNOSIS — Z1231 Encounter for screening mammogram for malignant neoplasm of breast: Secondary | ICD-10-CM

## 2017-11-29 ENCOUNTER — Telehealth: Payer: Self-pay | Admitting: "Endocrinology

## 2017-11-29 DIAGNOSIS — E039 Hypothyroidism, unspecified: Secondary | ICD-10-CM

## 2017-11-29 NOTE — Telephone Encounter (Signed)
Yes she has to continue taking her Levothyroxine 125 mcg daily. She would need this medication for life. And I want her to return in 3 months from her last visit with repeat labs.

## 2017-11-29 NOTE — Telephone Encounter (Signed)
Andrea Martinez is calling stating does she come back in 68yr or 3 mons according to her lab results, also does she continue the levothyroxine (SYNTHROID, LEVOTHROID) 125 MCG tablet  Please advise?

## 2017-12-01 ENCOUNTER — Other Ambulatory Visit: Payer: Self-pay | Admitting: "Endocrinology

## 2017-12-01 DIAGNOSIS — E039 Hypothyroidism, unspecified: Secondary | ICD-10-CM

## 2017-12-01 MED ORDER — LEVOTHYROXINE SODIUM 125 MCG PO TABS: 125.0000 ug | ORAL_TABLET | Freq: Every day | ORAL | 1 refills | Status: DC

## 2017-12-01 NOTE — Telephone Encounter (Signed)
I will send a prescription to her pharmacy.

## 2017-12-01 NOTE — Telephone Encounter (Signed)
She is aware of the recommendation she is needing a refill on the Levothyroxine 125 mcg

## 2017-12-02 ENCOUNTER — Ambulatory Visit (HOSPITAL_COMMUNITY)
Admission: RE | Admit: 2017-12-02 | Discharge: 2017-12-02 | Disposition: A | Discharge status: Home / Self Care | Payer: Medicare Other | Source: Ambulatory Visit | Attending: Pulmonary Disease | Admitting: Pulmonary Disease

## 2017-12-02 DIAGNOSIS — Z1231 Encounter for screening mammogram for malignant neoplasm of breast: Secondary | ICD-10-CM | POA: Insufficient documentation

## 2017-12-14 ENCOUNTER — Ambulatory Visit (HOSPITAL_COMMUNITY): Payer: Medicare Other

## 2017-12-14 ENCOUNTER — Other Ambulatory Visit (HOSPITAL_COMMUNITY): Payer: Medicare Other

## 2017-12-15 ENCOUNTER — Ambulatory Visit (HOSPITAL_COMMUNITY): Payer: Medicare Other

## 2017-12-15 ENCOUNTER — Other Ambulatory Visit (HOSPITAL_COMMUNITY): Payer: Medicare Other

## 2017-12-21 ENCOUNTER — Other Ambulatory Visit: Payer: Self-pay | Admitting: "Endocrinology

## 2017-12-21 DIAGNOSIS — E039 Hypothyroidism, unspecified: Secondary | ICD-10-CM

## 2017-12-26 ENCOUNTER — Ambulatory Visit (HOSPITAL_COMMUNITY): Payer: Medicare Other

## 2017-12-26 ENCOUNTER — Other Ambulatory Visit (HOSPITAL_COMMUNITY): Payer: Medicare Other

## 2017-12-27 ENCOUNTER — Encounter (HOSPITAL_COMMUNITY): Payer: Self-pay | Admitting: Hematology and Oncology

## 2017-12-27 ENCOUNTER — Inpatient Hospital Stay (HOSPITAL_BASED_OUTPATIENT_CLINIC_OR_DEPARTMENT_OTHER): Payer: Medicare Other | Admitting: Hematology and Oncology

## 2017-12-27 ENCOUNTER — Other Ambulatory Visit: Payer: Self-pay

## 2017-12-27 ENCOUNTER — Inpatient Hospital Stay (HOSPITAL_COMMUNITY): Payer: Medicare Other | Attending: Oncology

## 2017-12-27 VITALS — BP 108/57 | HR 77 | Temp 98.5°F | Resp 18 | Ht 65.0 in | Wt 217.4 lb

## 2017-12-27 DIAGNOSIS — E039 Hypothyroidism, unspecified: Secondary | ICD-10-CM

## 2017-12-27 DIAGNOSIS — Z882 Allergy status to sulfonamides status: Secondary | ICD-10-CM

## 2017-12-27 DIAGNOSIS — R6883 Chills (without fever): Secondary | ICD-10-CM

## 2017-12-27 DIAGNOSIS — R5383 Other fatigue: Secondary | ICD-10-CM | POA: Diagnosis not present

## 2017-12-27 DIAGNOSIS — N8110 Cystocele, unspecified: Secondary | ICD-10-CM

## 2017-12-27 DIAGNOSIS — R945 Abnormal results of liver function studies: Secondary | ICD-10-CM | POA: Diagnosis not present

## 2017-12-27 DIAGNOSIS — R9431 Abnormal electrocardiogram [ECG] [EKG]: Secondary | ICD-10-CM | POA: Diagnosis not present

## 2017-12-27 DIAGNOSIS — M7918 Myalgia, other site: Secondary | ICD-10-CM | POA: Diagnosis not present

## 2017-12-27 DIAGNOSIS — Z883 Allergy status to other anti-infective agents status: Secondary | ICD-10-CM | POA: Diagnosis not present

## 2017-12-27 DIAGNOSIS — G629 Polyneuropathy, unspecified: Secondary | ICD-10-CM | POA: Insufficient documentation

## 2017-12-27 DIAGNOSIS — R1031 Right lower quadrant pain: Secondary | ICD-10-CM | POA: Insufficient documentation

## 2017-12-27 DIAGNOSIS — F329 Major depressive disorder, single episode, unspecified: Secondary | ICD-10-CM | POA: Insufficient documentation

## 2017-12-27 DIAGNOSIS — R61 Generalized hyperhidrosis: Secondary | ICD-10-CM

## 2017-12-27 DIAGNOSIS — G4700 Insomnia, unspecified: Secondary | ICD-10-CM

## 2017-12-27 DIAGNOSIS — Z885 Allergy status to narcotic agent status: Secondary | ICD-10-CM | POA: Insufficient documentation

## 2017-12-27 DIAGNOSIS — Z79899 Other long term (current) drug therapy: Secondary | ICD-10-CM | POA: Insufficient documentation

## 2017-12-27 DIAGNOSIS — C9210 Chronic myeloid leukemia, BCR/ABL-positive, not having achieved remission: Secondary | ICD-10-CM

## 2017-12-27 DIAGNOSIS — R3915 Urgency of urination: Secondary | ICD-10-CM

## 2017-12-27 DIAGNOSIS — Z5181 Encounter for therapeutic drug level monitoring: Secondary | ICD-10-CM

## 2017-12-27 DIAGNOSIS — R748 Abnormal levels of other serum enzymes: Secondary | ICD-10-CM

## 2017-12-27 LAB — TSH: TSH: 0.626 u[IU]/mL (ref 0.350–4.500)

## 2017-12-27 LAB — CBC WITH DIFFERENTIAL/PLATELET
BASOS ABS: 0 10*3/uL (ref 0.0–0.1)
Basophils Relative: 0 %
Eosinophils Absolute: 0.2 10*3/uL (ref 0.0–0.7)
Eosinophils Relative: 2 %
HEMATOCRIT: 38.6 % (ref 36.0–46.0)
Hemoglobin: 12.2 g/dL (ref 12.0–15.0)
Lymphocytes Relative: 17 %
Lymphs Abs: 1.8 10*3/uL (ref 0.7–4.0)
MCH: 28.2 pg (ref 26.0–34.0)
MCHC: 31.6 g/dL (ref 30.0–36.0)
MCV: 89.4 fL (ref 78.0–100.0)
MONO ABS: 0.8 10*3/uL (ref 0.1–1.0)
MONOS PCT: 8 %
NEUTROS ABS: 7.7 10*3/uL (ref 1.7–7.7)
Neutrophils Relative %: 73 %
Platelets: 248 10*3/uL (ref 150–400)
RBC: 4.32 MIL/uL (ref 3.87–5.11)
RDW: 13.2 % (ref 11.5–15.5)
WBC: 10.5 10*3/uL (ref 4.0–10.5)

## 2017-12-27 LAB — T4, FREE: FREE T4: 1.16 ng/dL — AB (ref 0.61–1.12)

## 2017-12-27 LAB — COMPREHENSIVE METABOLIC PANEL
ALT: 23 U/L (ref 14–54)
AST: 32 U/L (ref 15–41)
Albumin: 3.8 g/dL (ref 3.5–5.0)
Alkaline Phosphatase: 158 U/L — ABNORMAL HIGH (ref 38–126)
Anion gap: 11 (ref 5–15)
BUN: 18 mg/dL (ref 6–20)
CO2: 27 mmol/L (ref 22–32)
CREATININE: 0.8 mg/dL (ref 0.44–1.00)
Calcium: 9.3 mg/dL (ref 8.9–10.3)
Chloride: 101 mmol/L (ref 101–111)
GFR calc Af Amer: >60 mL/min (ref >60)
Glucose, Bld: 120 mg/dL — ABNORMAL HIGH (ref 65–99)
POTASSIUM: 4.5 mmol/L (ref 3.5–5.1)
Sodium: 139 mmol/L (ref 135–145)
Total Bilirubin: 0.8 mg/dL (ref 0.3–1.2)
Total Protein: 7.4 g/dL (ref 6.5–8.1)

## 2017-12-30 ENCOUNTER — Ambulatory Visit (HOSPITAL_COMMUNITY)
Admission: RE | Admit: 2017-12-30 | Discharge: 2017-12-30 | Disposition: A | Discharge status: Home / Self Care | Payer: Medicare Other | Source: Ambulatory Visit | Attending: Hematology and Oncology | Admitting: Hematology and Oncology

## 2017-12-30 DIAGNOSIS — R748 Abnormal levels of other serum enzymes: Secondary | ICD-10-CM | POA: Insufficient documentation

## 2017-12-30 DIAGNOSIS — Z9049 Acquired absence of other specified parts of digestive tract: Secondary | ICD-10-CM | POA: Insufficient documentation

## 2017-12-30 DIAGNOSIS — R932 Abnormal findings on diagnostic imaging of liver and biliary tract: Secondary | ICD-10-CM | POA: Diagnosis not present

## 2017-12-30 DIAGNOSIS — R1011 Right upper quadrant pain: Secondary | ICD-10-CM | POA: Diagnosis not present

## 2018-01-05 LAB — BCR-ABL1, CML/ALL, PCR, QUANT: B3A2 TRANSCRIPT: 0.0712 %

## 2018-01-06 ENCOUNTER — Encounter (HOSPITAL_COMMUNITY): Payer: Self-pay | Admitting: Adult Health

## 2018-01-06 ENCOUNTER — Other Ambulatory Visit (HOSPITAL_COMMUNITY): Payer: Self-pay | Admitting: *Deleted

## 2018-01-06 ENCOUNTER — Telehealth (HOSPITAL_COMMUNITY): Payer: Self-pay | Admitting: *Deleted

## 2018-01-06 DIAGNOSIS — C9210 Chronic myeloid leukemia, BCR/ABL-positive, not having achieved remission: Secondary | ICD-10-CM

## 2018-01-06 MED ORDER — TRAMADOL HCL 50 MG PO TABS
50.0000 mg | ORAL_TABLET | Freq: Four times a day (QID) | ORAL | 0 refills | Status: DC | PRN
Start: 2018-01-06 — End: 2018-03-03

## 2018-01-06 NOTE — Telephone Encounter (Signed)
-----   Message from Ardath Sax, MD sent at 01/06/2018  3:29 PM EST ----- Fatty liver without evidence of cirrhosis or mass ----- Message ----- From: Donetta Potts, RN Sent: 01/06/2018   3:20 PM To: Ardath Sax, MD  Patient is requesting results from her recent ultrasound. Please advise.

## 2018-01-06 NOTE — Progress Notes (Signed)
Patient called cancer center requesting refill of Tramadol.   [place] Controlled Substance Reporting System reviewed and refill is appropriate on or after 01/06/18. Medication e-scribed to her pharmacy ([facility]) using [employer]'s 2-step verification process.    [place] reviewed:     Mike Craze, NP [place] [phone]

## 2018-01-06 NOTE — Telephone Encounter (Signed)
Results of ultrasound given. Patient verbalizes understanding.

## 2018-01-08 NOTE — Progress Notes (Signed)
[facility] Follow-up Visit:  Assessment: CML (chronic myeloid leukemia) ([place]) 72 y.o. female with chronic myelogenous leukemia with detectable disease, but no evidence of progression, while receiving therapy with nilotinib (Tasigna).  Patient is experiencing multiple other symptoms, including musculoskeletal pain, neuropathic pain, depression, and bladder prolapse.  Musculoskeletal pains may be exacerbated by nilotinib (Tasigna), but otherwise there are no indications to interrupt or stop the medication to this point in time including stable QTc interval on the EKG obtained earlier today.  Plan: -Continue nilotinib (Tasigna) at the current dose -Discontinue gabapentin. -Right upper quadrant ultrasound due to abnormal liver function tests and right lower quadrant discomfort -Patient will follow up with primary care provider in 1-2 weeks for issues of depression. - Consult urology for bladder prolapse. - Return to clinic in 3 months: Labs including quantitative BCR/ABL, clinic visit, EKG  Voice recognition software was used and creation of this note. Despite my best effort at editing the text, some misspelling/errors may have occurred.  Orders Placed This Encounter  Procedures  . US Abdomen Limited    Standing Status:   Future    Number of Occurrences:   1    Standing Expiration Date:   12/27/2018    Order Specific Question:   Reason for Exam (SYMPTOM  OR DIAGNOSIS REQUIRED)    Answer:   RUQ pain with mild elevation of AP, please eval for biliary ductal dilation    Order Specific Question:   Preferred imaging location?    Answer:   [facility]  . TSH    Standing Status:   Future    Number of Occurrences:   1    Standing Expiration Date:   12/27/2018  . T4, free    Standing Status:   Future    Number of Occurrences:   1    Standing Expiration Date:   12/27/2018  . CBC with Differential    Standing Status:   Future    Standing Expiration Date:   12/27/2018   . Comprehensive metabolic panel    Standing Status:   Future    Standing Expiration Date:   12/27/2018  . Magnesium    Standing Status:   Future    Standing Expiration Date:   12/27/2018  . Ambulatory referral to Urology    Referral Priority:   Routine    Referral Type:   Consultation    Referral Reason:   Specialty Services Required    Requested Specialty:   Urology    Number of Visits Requested:   1  . EKG 12-Lead    Standing Status:   Future    Standing Expiration Date:   12/27/2018    Order Specific Question:   Where should this test be performed    Answer:   [facility]    Cancer Staging No matching staging information was found for the patient.  All questions were answered.  . The patient knows to call the clinic with any problems, questions or concerns.  This note was electronically signed.    History of Presenting Illness Andrea Martinez 72 y.o. presenting to the [facility] for diagnosis of chronic myelogenous leukemia, currently receiving nilotinib (Tasigna) since 06/11/17.  In the interim, patient denies any new complaints.  She does have chronic issues with neuropathic pain and insomnia.  She has been previously tried on gabapentin 300 mg nightly, but experienced excessive somnolence in the morning and has stopped the medication on her own.  Muscle aches and  joint pains are not well controlled with tramadol at this time.  Patient also has neuropathic pain with burning in bilateral legs.  She also complains of Lexapro not working for her depressive symptoms.  In addition, she has intermittent right lower abdominal pain without nausea, vomiting, or appetite change.  She also has urinary difficulties that she attributes to prolapse of her bladder with difficulties controlling her urine at times.  Denies any fevers, chills, night sweats.  Denies any new lymphadenopathy.  No shortness of breath, chest pain, or cough.  No nausea, vomiting, diarrhea, or constipation.  No  dysuria.  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 Florene Glen [facility]      11/21/2015 -  Chemotherapy    Hydrea 1000 mg and Allopurinol 300 mg daily (due to high uric acid level)      11/21/2015 Tumor Marker    BCR P210 Ratio of Fusion to Control, Blood 0.8941    P210 IS % Ratio, Blood 85.834%        01/02/2016 - 10/23/2016 Chemotherapy    Tasigna      01/08/2016 Miscellaneous    EKG- QT/QTc: 382/440 ms      01/16/2016 Miscellaneous    EKG- QT/QTc: 364/419      03/30/2016 Tumor Marker    b2a2 transcript: 0.317 b3a2 transcript: 0.194      10/23/2016 - 11/05/2016 Hospital Admission    large Left Pleural Effusion and Pericardial Effusion that is now s/p VATS with Drainage, and Drainage of Pericardial Fluid with Window and Biopsy of Pericardium      12/06/2016 -  Chemotherapy    Tasigna restarted       05/24/2017 Adverse Reaction    Prolonged QTc on EKG; likely d/t chemo. Tasigna ON HOLD until next visit.       06/10/2017 -  Chemotherapy    Resumed Tasigna      07/13/2017 Miscellaneous    Evaluated by Cardiology for prolonged QTc. Per Dr. Bronson Ing, patient's QT intervals are not prolonged to the point of potentially promoting a ventricular arrhythmia. He thinks she can continue on current medical therapy for her CML with serial ECG monitoring.      12/27/2017 Miscellaneous    EKG: QT/QTc 398/435       Medical History: Past Medical History:  Diagnosis Date  . Chronic myelogenous leukemia (CML), BCR-ABL1-positive ([place]) 11/20/2015  . Gastric ulcer 2011   EGD, 5/11  . Hypothyroidism    not on meds, followed by Dr. Elyse Hsu  . Leukocytosis 11/20/2015  . Lipoma    left upper arm  . PONV (postoperative nausea and vomiting)   . UTI (lower urinary tract infection)    frequent    Surgical  History: Past Surgical History:  Procedure Laterality Date  . BRAVO [place] STUDY  12/04/2012   Procedure: BRAVO [place];  Surgeon: [name], MD;  [facility];  Service: Endoscopy;  Laterality: N/A;  . CHOLECYSTECTOMY N/A 01/03/2014   Procedure: LAPAROSCOPIC CHOLECYSTECTOMY WITH INTRAOPERATIVE CHOLANGIOGRAM;  Surgeon: Joyice Faster. Cornett, MD;  Location: [facility];  Service: General;  Laterality: N/A;  . COLONOSCOPY  01/2004   [facility], few small tics  . ESOPHAGOGASTRODUODENOSCOPY  02/2010   gastric ulcers  . ESOPHAGOGASTRODUODENOSCOPY  12/04/2012   JQZ:ESPQZRA gastritis (inflammation) was found in the gastric antrum; multiple  biopsies The duodenal mucosa showed no abnormalities in the bulb and second portion of the duodenum  . LIPOMA EXCISION  08/02/2011   left shoulder  . NOSE SURGERY    . PARTIAL HYSTERECTOMY     vaginal at age 52 years of age  . TOE DEBRIDEMENT  1962   lt great toe  . VIDEO ASSISTED THORACOSCOPY (VATS)/EMPYEMA Left 10/25/2016   Procedure: VIDEO ASSISTED THORACOSCOPY (VATS), BRONCH,DRAINAGE OF PLEURAL EFFUSION,PERICARDIAL WINDOW WITH DRAINAGE OF PERICARDIAL FLUID, TEE;  Surgeon: [name], MD;  Location: [facility];  Service: Thoracic;  Laterality: Left;  Marland Kitchen VIDEO BRONCHOSCOPY N/A 10/25/2016   Procedure: VIDEO BRONCHOSCOPY;  Surgeon: [name], MD;  Location: [facility];  Service: Thoracic;  Laterality: N/A;    Family History: Family History  Problem Relation Age of Onset  . Lung cancer Father   . COPD Mother   . Colon cancer Neg Hx   . Anesthesia problems Neg Hx   . Hypotension Neg Hx   . Malignant hyperthermia Neg Hx   . Pseudochol deficiency Neg Hx     Social History: Social History   Socioeconomic History  . Marital status: Married    Spouse name: Not on file  . Number of children: 2  . Years of education: 12th grade  . Highest education level: Not on file  Social Needs  . Financial resource strain: Not on file   . Food insecurity - worry: Not on file  . Food insecurity - inability: Not on file  . Transportation needs - medical: Not on file  . Transportation needs - non-medical: Not on file  Occupational History  . Occupation: retired    Fish farm manager: UENPLOYED    Employer: RETIRED  Tobacco Use  . Smoking status: Never Smoker  . Smokeless tobacco: Never Used  Substance and Sexual Activity  . Alcohol use: No    Alcohol/week: 0.0 oz  . Drug use: No  . Sexual activity: Yes    Partners: Male    Birth control/protection: None  Other Topics Concern  . Not on file  Social History Narrative  . Not on file    Allergies: Allergies  Allergen Reactions  . Codeine Nausea Only    Headache  . Esomeprazole Magnesium Nausea Only  . Sulfonamide Derivatives Nausea Only    Headache  . Fluconazole Rash    Redness and blistering on left thigh    Medications:  Current Outpatient Medications  Medication Sig Dispense Refill  . cetirizine (ZYRTEC) 10 MG tablet Take 10 mg by mouth daily.     . Cholecalciferol (VITAMIN D-3) 5000 UNITS TABS Take 5,000 Units by mouth daily.      Marland Kitchen escitalopram (LEXAPRO) 20 MG tablet TAKE 1 AND 1/2 TABLET DAILY 60 tablet 5  . levothyroxine (SYNTHROID, LEVOTHROID) 125 MCG tablet Take 1 tablet (125 mcg total) by mouth daily before breakfast. 90 tablet 1  . Multiple Vitamins-Minerals (CENTRUM SILVER PO) Take 1 capsule by mouth daily.    . nilotinib (TASIGNA) 150 MG capsule Take 2 capsules (300 mg total) by mouth every 12 (twelve) hours. 120 capsule 11  . vitamin B-12 (CYANOCOBALAMIN) 1000 MCG tablet Take 1,000 mcg by mouth daily.     . traMADol (ULTRAM) 50 MG tablet Take 1 tablet (50 mg total) by mouth every 6 (six) hours as needed. 90 tablet 0   No current facility-administered medications for this visit.     Review of Systems: Review of Systems  Constitutional: Positive for chills, diaphoresis and  fatigue.  Gastrointestinal: Positive for abdominal pain.  Musculoskeletal:  Positive for arthralgias and myalgias.  Skin: Positive for itching. Negative for rash.  Hematological: Bruises/bleeds easily.  All other systems reviewed and are negative.    PHYSICAL EXAMINATION Blood pressure (!) 108/57, pulse 77, temperature 98.5 F (36.9 C), temperature source Oral, resp. rate 18, height '5\' 5"'  (1.651 m), weight 217 lb 6.4 oz (98.6 kg), SpO2 97 %.  ECOG PERFORMANCE STATUS: 2 - Symptomatic, <50% confined to bed  Physical Exam  Constitutional: She is oriented to person, place, and time and well-developed, well-nourished, and in no distress. No distress.  HENT:  Head: Normocephalic and atraumatic.  Mouth/Throat: Oropharynx is clear and moist. No oropharyngeal exudate.  Eyes: Conjunctivae and EOM are normal. Pupils are equal, round, and reactive to light. No scleral icterus.  Neck: No thyromegaly present.  Cardiovascular: Normal rate, regular rhythm and normal heart sounds.  No murmur heard. Pulmonary/Chest: Effort normal. No respiratory distress. She has no wheezes. She has no rales.  Abdominal: Soft. Bowel sounds are normal. She exhibits no distension and no mass. There is no tenderness. There is no guarding.  Musculoskeletal: She exhibits no edema.  Lymphadenopathy:    She has no cervical adenopathy.  Neurological: She is alert and oriented to person, place, and time. She has normal reflexes. No cranial nerve deficit.  Skin: Skin is warm and dry. No rash noted. She is not diaphoretic. No erythema.     LABORATORY DATA: I have personally reviewed the data as listed: Office Visit on 12/27/2017  Component Date Value Ref Range Status  . Free T4 12/27/2017 1.16* 0.61 - 1.12 ng/dL Final   Comment: (NOTE) Biotin ingestion may interfere with free T4 tests. If the results are inconsistent with the TSH level, previous test results, or the clinical presentation, then consider biotin interference. If needed, order repeat testing after stopping biotin. Performed at [facility], [place] [address] [place], [place] [address]   . TSH 12/27/2017 0.626  0.350 - 4.500 uIU/mL Final   Performed by a 3rd Generation assay with a functional sensitivity of <=0.01 uIU/mL.  Appointment on 12/27/2017  Component Date Value Ref Range Status  . WBC 12/27/2017 10.5  4.0 - 10.5 K/uL Final  . RBC 12/27/2017 4.32  3.87 - 5.11 MIL/uL Final  . Hemoglobin 12/27/2017 12.2  12.0 - 15.0 g/dL Final  . HCT 12/27/2017 38.6  36.0 - 46.0 % Final  . MCV 12/27/2017 89.4  78.0 - 100.0 fL Final  . MCH 12/27/2017 28.2  26.0 - 34.0 pg Final  . MCHC 12/27/2017 31.6  30.0 - 36.0 g/dL Final  . RDW 12/27/2017 13.2  11.5 - 15.5 % Final  . Platelets 12/27/2017 248  150 - 400 K/uL Final  . Neutrophils Relative % 12/27/2017 73  % Final  . Neutro Abs 12/27/2017 7.7  1.7 - 7.7 K/uL Final  . Lymphocytes Relative 12/27/2017 17  % Final  . Lymphs Abs 12/27/2017 1.8  0.7 - 4.0 K/uL Final  . Monocytes Relative 12/27/2017 8  % Final  . Monocytes Absolute 12/27/2017 0.8  0.1 - 1.0 K/uL Final  . Eosinophils Relative 12/27/2017 2  % Final  . Eosinophils Absolute 12/27/2017 0.2  0.0 - 0.7 K/uL Final  . Basophils Relative 12/27/2017 0  % Final  . Basophils Absolute 12/27/2017 0.0  0.0 - 0.1 K/uL Final  . Sodium 12/27/2017 139  135 - 145 mmol/L Final  . Potassium 12/27/2017 4.5  3.5 - 5.1  mmol/L Final  . Chloride 12/27/2017 101  101 - 111 mmol/L Final  . CO2 12/27/2017 27  22 - 32 mmol/L Final  . Glucose, Bld 12/27/2017 120* 65 - 99 mg/dL Final  . BUN 12/27/2017 18  6 - 20 mg/dL Final  . Creatinine, Ser 12/27/2017 0.80  0.44 - 1.00 mg/dL Final  . Calcium 12/27/2017 9.3  8.9 - 10.3 mg/dL Final  . Total Protein 12/27/2017 7.4  6.5 - 8.1 g/dL Final  . Albumin 12/27/2017 3.8  3.5 - 5.0 g/dL Final  . AST 12/27/2017 32  15 - 41 U/L Final  . ALT 12/27/2017 23  14 - 54 U/L Final  . Alkaline Phosphatase 12/27/2017 158* 38 - 126 U/L Final  . Total Bilirubin 12/27/2017 0.8  0.3 - 1.2 mg/dL Final  . GFR calc  non Af Amer 12/27/2017 >60  >60 mL/min Final  . GFR calc Af Amer 12/27/2017 >60  >60 mL/min Final   Comment: (NOTE) The eGFR has been calculated using the CKD EPI equation. This calculation has not been validated in all clinical situations. eGFR's persistently <60 mL/min signify possible Chronic Kidney Disease.   . Anion gap 12/27/2017 11  5 - 15 Final  . b2a2 transcript 12/27/2017 Comment  % Final   Comment: (NOTE)           <0.0032 % (sensitivity limit of assay)   . b3a2 transcript 12/27/2017 0.0712  % Final  . E1A2 Transcript 12/27/2017 Comment  % Final   Comment: (NOTE)           <0.0032 % (sensitivity limit of assay)   . Interpretation (BCRAL): 12/27/2017 ABL1 e14a2 (b3a2, p210) fusion transcript.   Final   Comment: Comment POSITIVE for the BCR   . Director Review Adrian Saran): 12/27/2017 Comment   Final   Comment: (NOTE) Katina Degree, MD, PhD Director, [facility] [place], [place] [address] [phone]   . Background: 12/27/2017 Comment   Corrected   Comment: (NOTE) This assay can detect three different types of BCR-ABL1 fusion transcripts associated with CML, ALL, and AML: e13a2 (previously b2a2) and e14a2 (previously b3a2) (major breakpoint, p210), as well as e1a2 (minor breakpoint, p190). The e13a2 and e14a2 transcript values are titrated to the current International Scale (IS). The standardized baseline is 100% BCR-ABL1 (IS) and major molecular response (MMR) is equivalent to 0.1% BCR-ABL1 (IS) corresponding to a 3-log reduction. Results should be correlated with appropriate clinical and laboratory information as indicated.   . Methodology 12/27/2017 Comment   Corrected   Comment: (NOTE) Total RNA is isolated from the sample and subject to a real-time, reverse transcriptase polymerase chain reaction (RT-PCR). The PCR primers and probes are specific for BCR-ABL1 e13a2, e14a2 and e1a2 fusion  transcripts. The ABL1 transcript is amplified as the control for cDNA quantity and quality. Serial dilutions of a validated positive control RNA with known t(9;22) BCR-ABL1 are used as reference for quantification of BCR-ABL1 relative to ABL1. The numeric BCR-ABL1 level is reportd as % BCR-ABL1/ABL1 and the detection sensitivity is 4.5 log below the standard baseline. This test was developed and its performance characteristics determined by [facility]. It has not been cleared or approved by the Food and Drug Administration. References:    1. Anastasia Fiedler and Branford S: Seminars in Hematology 2003;       40 (suppl2):62-68.    2. White HE, et al. Blood 2010; 116: e111-117.    3. NCCN Clinical Practice Guidelines in  Oncology, Chronic       Myeloid Leukemia. V2. 2017.                           Performed At: [facility] [address] [place], [place] [id number] Nechama Guard MD LK:[id number] Performed At: [facility] [place], [place] [id number] Nechama Guard MD MM:[phone]        Ardath Sax, MD

## 2018-01-08 NOTE — Assessment & Plan Note (Signed)
72 y.o. female with chronic myelogenous leukemia with detectable disease, but no evidence of progression, while receiving therapy with nilotinib (Tasigna).  Patient is experiencing multiple other symptoms, including musculoskeletal pain, neuropathic pain, depression, and bladder prolapse.  Musculoskeletal pains may be exacerbated by nilotinib (Tasigna), but otherwise there are no indications to interrupt or stop the medication to this point in time including stable QTc interval on the EKG obtained earlier today.  Plan: -Continue nilotinib (Tasigna) at the current dose -Discontinue gabapentin. -Right upper quadrant ultrasound due to abnormal liver function tests and right lower quadrant discomfort -Patient will follow up with primary care provider in 1-2 weeks for issues of depression. - Consult urology for bladder prolapse. - Return to clinic in 3 months: Labs including quantitative BCR/ABL, clinic visit, EKG

## 2018-01-09 DIAGNOSIS — C9220 Atypical chronic myeloid leukemia, BCR/ABL-negative, not having achieved remission: Secondary | ICD-10-CM | POA: Diagnosis not present

## 2018-01-09 DIAGNOSIS — R0602 Shortness of breath: Secondary | ICD-10-CM | POA: Diagnosis not present

## 2018-01-09 DIAGNOSIS — K210 Gastro-esophageal reflux disease with esophagitis: Secondary | ICD-10-CM | POA: Diagnosis not present

## 2018-01-09 DIAGNOSIS — E079 Disorder of thyroid, unspecified: Secondary | ICD-10-CM | POA: Diagnosis not present

## 2018-01-09 DIAGNOSIS — M1990 Unspecified osteoarthritis, unspecified site: Secondary | ICD-10-CM | POA: Diagnosis not present

## 2018-01-10 ENCOUNTER — Ambulatory Visit: Payer: Medicare Other | Admitting: "Endocrinology

## 2018-01-10 ENCOUNTER — Encounter: Payer: Self-pay | Admitting: "Endocrinology

## 2018-01-10 ENCOUNTER — Encounter (HOSPITAL_COMMUNITY): Payer: Self-pay | Admitting: Hematology and Oncology

## 2018-01-10 VITALS — BP 127/83 | HR 81 | Ht 65.0 in | Wt 216.0 lb

## 2018-01-10 DIAGNOSIS — E039 Hypothyroidism, unspecified: Secondary | ICD-10-CM

## 2018-01-10 MED ORDER — LEVOTHYROXINE SODIUM 125 MCG PO TABS: 125.0000 ug | ORAL_TABLET | Freq: Every day | ORAL | 2 refills | Status: DC

## 2018-01-10 NOTE — Progress Notes (Signed)
Faxed referral/pt records to [facility]. [phone]. °

## 2018-01-10 NOTE — Progress Notes (Signed)
Subjective:    Patient ID: Andrea Martinez, female    DOB: 1946-05-11, PCP Sinda Du, MD   Past Medical History:  Diagnosis Date  . Chronic myelogenous leukemia (CML), BCR-ABL1-positive ([place]) 11/20/2015  . Gastric ulcer 2011   EGD, 5/11  . Hypothyroidism    not on meds, followed by Dr. Elyse Hsu  . Leukocytosis 11/20/2015  . Lipoma    left upper arm  . PONV (postoperative nausea and vomiting)   . UTI (lower urinary tract infection)    frequent   Past Surgical History:  Procedure Laterality Date  . BRAVO [place] STUDY  12/04/2012   Procedure: BRAVO [place];  Surgeon: [name], MD;  [facility]: [facility];  Service: Endoscopy;  Laterality: N/A;  . CHOLECYSTECTOMY N/A 01/03/2014   Procedure: LAPAROSCOPIC CHOLECYSTECTOMY WITH INTRAOPERATIVE CHOLANGIOGRAM;  Surgeon: Joyice Faster. Cornett, MD;  Location: [place];  Service: General;  Laterality: N/A;  . COLONOSCOPY  01/2004   DR [facility], few small tics  . ESOPHAGOGASTRODUODENOSCOPY  02/2010   gastric ulcers  . ESOPHAGOGASTRODUODENOSCOPY  12/04/2012   MCN:OBSJGGE gastritis (inflammation) was found in the gastric antrum; multiple biopsies The duodenal mucosa showed no abnormalities in the bulb and second portion of the duodenum  . LIPOMA EXCISION  08/02/2011   left shoulder  . NOSE SURGERY    . PARTIAL HYSTERECTOMY     vaginal at age 53 years of age  . TOE DEBRIDEMENT  1962   lt great toe  . VIDEO ASSISTED THORACOSCOPY (VATS)/EMPYEMA Left 10/25/2016   Procedure: VIDEO ASSISTED THORACOSCOPY (VATS), BRONCH,DRAINAGE OF PLEURAL EFFUSION,PERICARDIAL WINDOW WITH DRAINAGE OF PERICARDIAL FLUID, TEE;  Surgeon: [name], MD;  Location: [place];  Service: Thoracic;  Laterality: Left;  Marland Kitchen VIDEO BRONCHOSCOPY N/A 10/25/2016   Procedure: VIDEO BRONCHOSCOPY;  Surgeon: [name], MD;  Location: [place];  Service: Thoracic;  Laterality: N/A;   Social History   Socioeconomic History  . Marital status: Married    Spouse name: None  . Number of children: 2  . Years of education: 12th grade  . Highest education level: None  Social Needs  . Financial resource strain: None  . Food insecurity - worry: None  . Food insecurity - inability: None  . Transportation needs - medical: None  . Transportation needs - non-medical: None  Occupational History  . Occupation: retired    Fish farm manager: UENPLOYED    Employer: RETIRED  Tobacco Use  . Smoking status: Never Smoker  . Smokeless tobacco: Never Used  Substance and Sexual Activity  . Alcohol use: No    Alcohol/week: 0.0 oz  . Drug use: No  . Sexual activity: Yes    Partners: Male    Birth control/protection: None  Other Topics Concern  . None  Social History Narrative  . None   Outpatient Encounter Medications as of 01/10/2018  Medication Sig  . cetirizine (ZYRTEC) 10 MG tablet Take 10 mg by mouth daily.   . Cholecalciferol (VITAMIN D-3) 5000 UNITS TABS Take 5,000 Units by mouth daily.    Marland Kitchen escitalopram (LEXAPRO) 20 MG tablet TAKE 1 AND 1/2 TABLET DAILY  . levothyroxine (SYNTHROID, LEVOTHROID) 125 MCG tablet Take 1 tablet (125 mcg total) by mouth daily before breakfast.  . Multiple Vitamins-Minerals (CENTRUM SILVER PO) Take 1 capsule by mouth daily.  . nilotinib (TASIGNA) 150 MG capsule Take 2 capsules (300 mg total) by mouth every 12 (twelve) hours.  . traMADol (ULTRAM) 50 MG tablet Take 1 tablet (  50 mg total) by mouth every 6 (six) hours as needed.  . vitamin B-12 (CYANOCOBALAMIN) 1000 MCG tablet Take 1,000 mcg by mouth daily.    No facility-administered encounter medications on file as of 01/10/2018.    ALLERGIES: Allergies  Allergen Reactions  . Codeine Nausea Only    Headache  . Esomeprazole Magnesium Nausea Only  . Sulfonamide Derivatives Nausea Only    Headache  . Fluconazole Rash    Redness and blistering on left thigh    VACCINATION STATUS: Immunization History  Administered Date(s) Administered  . Influenza,inj,Quad PF,6+ Mos  09/13/2017  . Influenza-Unspecified 09/24/2015    HPI 72 year old female patient with medical history as above. She is being seen in f/u  for hypothyroidism . -She is currently on thyroxine 125 g by mouth every morning. She reports compliance to this medication.  - She Has steady weight since last visit, complains of fatigue and  sweating. - her 24 hour urine studies were negative for pheochromocytoma. she is on medications for CML- with nilotinib. -She has family history of thyroid problem involving goiter which required thyroidectomy in her mother who took thyroid hormone replacement subsequently. -She denies dysphagia, shortness of breath, voice change. -She is compliant to her medications. She is taking  Review of Systems  Constitutional: + weight gain, + fatigue, + subjective hyperthermia.  Eyes: no blurry vision, no xerophthalmia ENT: no sore throat, no nodules palpated in throat, no dysphagia/odynophagia, no hoarseness Cardiovascular: no Chest Pain, no Shortness of Breath, no palpitations, no leg swelling Respiratory: no cough, no SOB Gastrointestinal: no Nausea/Vomiting/Diarhhea Musculoskeletal: no muscle/joint aches Skin: no rashes Neurological: - tremors, no numbness, no tingling, no dizziness Psychiatric: no depression, no anxiety  Objective:    BP 127/83   Pulse 81   Ht 5\' 5"  (1.651 m)   Wt 216 lb (98 kg)   BMI 35.94 kg/m   Wt Readings from Last 3 Encounters:  01/10/18 216 lb (98 kg)  12/27/17 217 lb 6.4 oz (98.6 kg)  10/11/17 215 lb (97.5 kg)    Physical Exam  Constitutional:  Obese,  not in acute distress, normal state of mind Eyes: PERRLA, EOMI, no exophthalmos ENT: moist mucous membranes, no thyromegaly, no cervical lymphadenopathy Cardiovascular: normal precordial activity, Regular Rate and Rhythm, no Murmur/Rubs/Gallops Respiratory:  adequate breathing efforts, no gross chest deformity, Clear to auscultation bilaterally Gastrointestinal: abdomen  soft, Non -tender, No distension, Bowel Sounds present Musculoskeletal: no gross deformities, strength intact in all four extremities Skin: moist, warm, no rashes Neurological: - Mild tremor with outstretched hands, DTR reflexes normal in all four extremities.  CMP ( most recent) CMP     Component Value Date/Time   NA 139 12/27/2017 0959   K 4.5 12/27/2017 0959   CL 101 12/27/2017 0959   CO2 27 12/27/2017 0959   GLUCOSE 120 (H) 12/27/2017 0959   BUN 18 12/27/2017 0959   CREATININE 0.80 12/27/2017 0959   CREATININE 0.87 07/14/2011 0938   CALCIUM 9.3 12/27/2017 0959   PROT 7.4 12/27/2017 0959   ALBUMIN 3.8 12/27/2017 0959   AST 32 12/27/2017 0959   ALT 23 12/27/2017 0959   ALKPHOS 158 (H) 12/27/2017 0959   BILITOT 0.8 12/27/2017 0959   GFRNONAA >60 12/27/2017 0959   GFRAA >60 12/27/2017 0959   Diabetic Labs (most recent): Lab Results  Component Value Date   HGBA1C 5.3 10/28/2016    Results for NYASHIA, RANEY "ERYCA BOLTE" (MRN [id number]) as of 01/10/2018 16:53  Ref. Range 10/17/2017 14:30  Epinephrine, 24 hr Urine Unknown CANCELED  Metanephrine, Ur Latest Ref Range: 224 - 832 mcg/24 h 308  Metanephrines, Ur Latest Ref Range: 90 - 315 mcg/24 h 109  Creatinine, Urine mg/day-CATEUR Latest Ref Range: 0.50 - 2.15 g/24 h 0.79  Normetanephr.,U,24h Latest Ref Range: 122 - 676 mcg/24 h 199   Results for CARLYON, NOLASCO "RHIANNA RAULERSON" (MRN [id number]) as of 01/10/2018 16:53  Ref. Range 12/27/2017 12:13  TSH Latest Ref Range: 0.350 - 4.500 uIU/mL 0.626  T4,Free(Direct) Latest Ref Range: 0.61 - 1.12 ng/dL 1.16 (H)    Review of her prior chest x-ray showed evidence of COPD  Assessment & Plan:   1. Hypothyroidism - Her thyroid function tests are consistent with appropriate replacement.  - I advised her to continue on levothyroxine 125 g by mouth every morning.   - We discussed about correct intake of levothyroxine, at fasting, with water, separated by at least 30 minutes from  breakfast, and separated by more than 4 hours from calcium, iron, multivitamins, acid reflux medications (PPIs). -Patient is made aware of the fact that thyroid hormone replacement is needed for life, dose to be adjusted by periodic monitoring of thyroid function tests. - Her clinical exam is negative for goiter, hence no need for imaging of the thyroid at this time.  Her 24 hour  Urine studies were negative for pheochromocytoma.  - I advised patient to maintain close follow up with Sinda Du, MD for primary care needs. Follow up plan: Return in about 6 months (around 07/10/2018) for follow up with pre-visit labs.  Glade Lloyd, MD Phone: [phone]  Fax: [phone]  -  This note was partially dictated with voice recognition software. Similar sounding words can be transcribed inadequately or may not  be corrected upon review.  01/10/2018, 4:50 PM

## 2018-01-12 DIAGNOSIS — L03031 Cellulitis of right toe: Secondary | ICD-10-CM | POA: Diagnosis not present

## 2018-01-12 DIAGNOSIS — M79675 Pain in left toe(s): Secondary | ICD-10-CM | POA: Diagnosis not present

## 2018-01-12 DIAGNOSIS — L03032 Cellulitis of left toe: Secondary | ICD-10-CM | POA: Diagnosis not present

## 2018-01-17 ENCOUNTER — Telehealth (HOSPITAL_COMMUNITY): Payer: Self-pay | Admitting: Adult Health

## 2018-01-17 NOTE — Telephone Encounter (Signed)
CALLED [facility] TO CHECK STATUS OF TASIGNA APP. PER CLAUDIA APP IS STILL PENDING.

## 2018-01-26 DIAGNOSIS — L03031 Cellulitis of right toe: Secondary | ICD-10-CM | POA: Diagnosis not present

## 2018-01-26 DIAGNOSIS — M79674 Pain in right toe(s): Secondary | ICD-10-CM | POA: Diagnosis not present

## 2018-02-07 ENCOUNTER — Telehealth (HOSPITAL_COMMUNITY): Payer: Self-pay | Admitting: Internal Medicine

## 2018-02-08 NOTE — Telephone Encounter (Signed)
Faxed documentation to [employer] showing CML funding is closed for Cancer [facility] and PAN[facility]

## 2018-02-09 DIAGNOSIS — L03031 Cellulitis of right toe: Secondary | ICD-10-CM | POA: Diagnosis not present

## 2018-02-13 ENCOUNTER — Telehealth (HOSPITAL_COMMUNITY): Payer: Self-pay | Admitting: Internal Medicine

## 2018-02-22 ENCOUNTER — Other Ambulatory Visit (HOSPITAL_COMMUNITY): Payer: Self-pay | Admitting: Oncology

## 2018-03-02 ENCOUNTER — Other Ambulatory Visit (HOSPITAL_COMMUNITY): Payer: Self-pay | Admitting: Adult Health

## 2018-03-02 DIAGNOSIS — C9210 Chronic myeloid leukemia, BCR/ABL-positive, not having achieved remission: Secondary | ICD-10-CM

## 2018-03-03 ENCOUNTER — Encounter (HOSPITAL_COMMUNITY): Payer: Self-pay | Admitting: Adult Health

## 2018-03-03 ENCOUNTER — Other Ambulatory Visit (HOSPITAL_COMMUNITY): Payer: Self-pay | Admitting: Adult Health

## 2018-03-03 DIAGNOSIS — C9210 Chronic myeloid leukemia, BCR/ABL-positive, not having achieved remission: Secondary | ICD-10-CM

## 2018-03-03 MED ORDER — TRAMADOL HCL 50 MG PO TABS: 50.0000 mg | ORAL_TABLET | Freq: Four times a day (QID) | ORAL | 0 refills | Status: DC | PRN

## 2018-03-03 NOTE — Progress Notes (Signed)
Received refill request from pharmacy for Tramadol.   [place] Controlled Substance Reporting System reviewed and refill is appropriate on or after 03/03/18. Medication e-scribed to her pharmacy ([facility]) using [employer]'s 2-step verification process.    [place] reviewed:     Mike Craze, NP [place] [phone]

## 2018-03-10 ENCOUNTER — Telehealth (HOSPITAL_COMMUNITY): Payer: Self-pay

## 2018-03-10 NOTE — Telephone Encounter (Signed)
Could consider starting Effexor XR low-dose, but this may interfere with her Tramadol.  Would encourage her to talk with Dr. Walden Field when she comes in for her follow-up visit next month to see if she thinks Effexor XR would be helpful or if she has other suggestions at that time.   gwd

## 2018-03-10 NOTE — Telephone Encounter (Signed)
Dr. Clydene Laming last note states she stopped the gabapentin herself due to excessive somnolence in the morning. Any other suggestions?

## 2018-03-10 NOTE — Telephone Encounter (Signed)
Called patient back. She states she is sweating a lot, during the day and night. She states she is not sleeping good at night because of the sweats. She is sweating so bad she has to change clothes. She is upset about all this sweating. She gets weak when she sweats. Explained to patient I would talk with the provider and call her back.

## 2018-03-10 NOTE — Telephone Encounter (Signed)
-----   Message from Louis Meckel sent at 03/09/2018  9:08 AM EDT ----- Regarding: please call Patient called and is feeling weak and tired. Please call.  [phone].  Thanks

## 2018-03-10 NOTE — Telephone Encounter (Signed)
She has had chronic issues with night sweats. We have discussed at several office visits that this may be d/t her Tasigna, her disease, or menopausal symptoms.  I have discussed with her before the option of gabapentin at bedtime; not sure if she ever tried this medication or not.  If not, then I am happy to send him gabapentin 300 mg po QHS for her to try to see if her symptoms improve.   Mike Craze, NP [place] [phone]

## 2018-03-14 NOTE — Telephone Encounter (Signed)
Called and spoke with patient regarding starting a low dose effexor. Patient decided to wait to start anything else until she comes in to see the MD later this month. She states she " needs her tramadol " and doesn't want to take anything that will interfere with it.   MB

## 2018-03-27 ENCOUNTER — Ambulatory Visit (HOSPITAL_COMMUNITY): Payer: Medicare Other | Admitting: Internal Medicine

## 2018-03-27 ENCOUNTER — Other Ambulatory Visit (HOSPITAL_COMMUNITY): Payer: Medicare Other

## 2018-04-06 ENCOUNTER — Other Ambulatory Visit (HOSPITAL_COMMUNITY): Payer: Medicare Other

## 2018-04-06 ENCOUNTER — Ambulatory Visit (HOSPITAL_COMMUNITY): Payer: Medicare Other | Admitting: Internal Medicine

## 2018-04-06 ENCOUNTER — Other Ambulatory Visit (HOSPITAL_COMMUNITY): Payer: Self-pay

## 2018-04-06 DIAGNOSIS — N819 Female genital prolapse, unspecified: Secondary | ICD-10-CM | POA: Diagnosis not present

## 2018-04-06 DIAGNOSIS — N8110 Cystocele, unspecified: Secondary | ICD-10-CM | POA: Diagnosis not present

## 2018-04-06 DIAGNOSIS — C9210 Chronic myeloid leukemia, BCR/ABL-positive, not having achieved remission: Secondary | ICD-10-CM

## 2018-04-07 ENCOUNTER — Other Ambulatory Visit: Payer: Self-pay

## 2018-04-07 ENCOUNTER — Inpatient Hospital Stay (HOSPITAL_COMMUNITY): Payer: Medicare Other | Attending: Hematology

## 2018-04-07 ENCOUNTER — Encounter (HOSPITAL_COMMUNITY): Payer: Self-pay | Admitting: Internal Medicine

## 2018-04-07 ENCOUNTER — Ambulatory Visit (HOSPITAL_COMMUNITY)
Admission: RE | Admit: 2018-04-07 | Discharge: 2018-04-07 | Disposition: A | Discharge status: Home / Self Care | Payer: Medicare Other | Source: Ambulatory Visit | Attending: Internal Medicine | Admitting: Internal Medicine

## 2018-04-07 ENCOUNTER — Inpatient Hospital Stay (HOSPITAL_BASED_OUTPATIENT_CLINIC_OR_DEPARTMENT_OTHER): Payer: Medicare Other | Admitting: Internal Medicine

## 2018-04-07 DIAGNOSIS — I517 Cardiomegaly: Secondary | ICD-10-CM | POA: Diagnosis not present

## 2018-04-07 DIAGNOSIS — R61 Generalized hyperhidrosis: Secondary | ICD-10-CM | POA: Insufficient documentation

## 2018-04-07 DIAGNOSIS — Z79899 Other long term (current) drug therapy: Secondary | ICD-10-CM

## 2018-04-07 DIAGNOSIS — R0602 Shortness of breath: Secondary | ICD-10-CM | POA: Diagnosis not present

## 2018-04-07 DIAGNOSIS — Z801 Family history of malignant neoplasm of trachea, bronchus and lung: Secondary | ICD-10-CM | POA: Insufficient documentation

## 2018-04-07 DIAGNOSIS — G4700 Insomnia, unspecified: Secondary | ICD-10-CM | POA: Insufficient documentation

## 2018-04-07 DIAGNOSIS — C9210 Chronic myeloid leukemia, BCR/ABL-positive, not having achieved remission: Secondary | ICD-10-CM | POA: Diagnosis not present

## 2018-04-07 DIAGNOSIS — Z9221 Personal history of antineoplastic chemotherapy: Secondary | ICD-10-CM

## 2018-04-07 DIAGNOSIS — M1990 Unspecified osteoarthritis, unspecified site: Secondary | ICD-10-CM | POA: Insufficient documentation

## 2018-04-07 DIAGNOSIS — R9431 Abnormal electrocardiogram [ECG] [EKG]: Secondary | ICD-10-CM | POA: Insufficient documentation

## 2018-04-07 DIAGNOSIS — E039 Hypothyroidism, unspecified: Secondary | ICD-10-CM | POA: Insufficient documentation

## 2018-04-07 LAB — COMPREHENSIVE METABOLIC PANEL
ALT: 24 U/L (ref 14–54)
AST: 29 U/L (ref 15–41)
Albumin: 3.9 g/dL (ref 3.5–5.0)
Alkaline Phosphatase: 190 U/L — ABNORMAL HIGH (ref 38–126)
Anion gap: 13 (ref 5–15)
BILIRUBIN TOTAL: 1.3 mg/dL — AB (ref 0.3–1.2)
BUN: 23 mg/dL — AB (ref 6–20)
CO2: 24 mmol/L (ref 22–32)
Calcium: 9.2 mg/dL (ref 8.9–10.3)
Chloride: 102 mmol/L (ref 101–111)
Creatinine, Ser: 0.82 mg/dL (ref 0.44–1.00)
Glucose, Bld: 97 mg/dL (ref 65–99)
POTASSIUM: 4.5 mmol/L (ref 3.5–5.1)
Sodium: 139 mmol/L (ref 135–145)
Total Protein: 7.4 g/dL (ref 6.5–8.1)

## 2018-04-07 LAB — CBC WITH DIFFERENTIAL/PLATELET
BASOS ABS: 0 10*3/uL (ref 0.0–0.1)
Basophils Relative: 0 %
Eosinophils Absolute: 0.2 10*3/uL (ref 0.0–0.7)
Eosinophils Relative: 2 %
HEMATOCRIT: 38.1 % (ref 36.0–46.0)
Hemoglobin: 11.9 g/dL — ABNORMAL LOW (ref 12.0–15.0)
LYMPHS ABS: 1.6 10*3/uL (ref 0.7–4.0)
LYMPHS PCT: 15 %
MCH: 28 pg (ref 26.0–34.0)
MCHC: 31.2 g/dL (ref 30.0–36.0)
MCV: 89.6 fL (ref 78.0–100.0)
MONO ABS: 0.8 10*3/uL (ref 0.1–1.0)
Monocytes Relative: 8 %
NEUTROS ABS: 8 10*3/uL — AB (ref 1.7–7.7)
Neutrophils Relative %: 75 %
Platelets: 250 10*3/uL (ref 150–400)
RBC: 4.25 MIL/uL (ref 3.87–5.11)
RDW: 13.4 % (ref 11.5–15.5)
WBC: 10.7 10*3/uL — ABNORMAL HIGH (ref 4.0–10.5)

## 2018-04-07 LAB — MAGNESIUM: Magnesium: 2 mg/dL (ref 1.7–2.4)

## 2018-04-07 NOTE — Patient Instructions (Signed)
Exam and discussion today with Dr. Walden Field. We will schedule you an appointment with your cardiologist for evaluation. Chest X-Ray today. Increase your Vitamin E to twice daily. You may taper off of your Lexapro if you like (1.5 tablets daily now, decrease to one tablet daily for several days, then decrease to half tablet for several days, then you may stop). Return to clinic in 3 months for lab work and office visit.

## 2018-04-07 NOTE — Progress Notes (Signed)
Diagnosis CML (chronic myeloid leukemia) ([place]) - Plan: EKG 12-Lead, CBC with Differential/Platelet, Comprehensive metabolic panel, Lactate dehydrogenase, BCR-ABL1, CML/ALL, PCR, QUANT, DG Chest 2 View, EKG 12-Lead  Staging Cancer Staging No matching staging information was found for the patient.  Assessment and Plan:  1.  CML, BCR/ABL +.  72 y.o. female with CML, BCR-ABL1+; started on Tasigna on 01/02/16. Treatment course complicated by left pleural effusion & pericardial effusion requiring pericardial window in 10/2016. Tasigna restarted on 12/06/16.   Most recent BCR-ABL labs done 12/2017  continue to show improving b2a2 and b3a2 transcripts.  EKG 02/14/17 without QTc prolongation. With epeat EKG on 05/24/17 revealed prolonged QT/QTc 406/441. Tasigna was placed ON HOLD on 05/24/17 as a result.  Baseline EKG from 12/2015 QT/QTc was 372/429 at that time.  Resumed tasigna on 06/11/17.  Patient has been evaluated by cardiology who stated that it was safe for her to resume her tasigna.   EKG performed today 04/07/2018 shows QT/QTc 382/438. Prior Prolonged QTc could be due to her underlying uncontrolled hypothyroidism for the past few months.  She also has evidence of low voltage and ? Anterior infarct.  Will refer the pt back to cardiology for ongoing follow-up.  I have discussed with her may have to consider change In therapy due to ongoing complaints especially if cardiac issues.    Continue Tasigna for now.  Awaiting BCR/ABL results.  She will be given 3 month follow-up with labs pending cardiology evaluation.  Have discussed option of Sprycel but will investigate prior effusion history based on potential of effusions with Sprycel.  CXR done today shows no effusion.    2.  Constant sweating.  She reports this is occurring day and night.  She is on Vitamin E.  I have recommended she take Vitamin E bid and should notify the office if symptoms fail to improve.    3.  Medication questions.  I have  discussed with her that Lexapro is for depression and Ultram for pain.  If she desires to discontinue Lexapro, she can and will be referred to psychiatry for depression evaluation if she desires.    4.  Pleural effusion.  This occurred in 2017.  CXR done today shows no effusions and chronic cardiomegaly.    5.  Hypothyroidism.  She is on Synthroid.  Follow-up with Dr. Luan Pulling or Dorris Fetch for monitoring.    Interval History:   72 year old female previously followed by Dr. Talbert Cage.  Pt was seen by cardiology on 07/13/2017 for her prolonged QTc. Per Dr. Bronson Ing, patient's QT intervals are not prolonged to the point of potentially promoting a ventricular arrhythmia. He thinks she can continue on current medical therapy for her CML with serial ECG monitoring.   Current Status:  Pt is seen today for follow-up.  She is complaining of chronic sweating day and night.  She also reports she is not sleeping.  She is questioning why she is on Ultram and Lexapro.  She denies any chest pain.      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 Florene Glen [facility]      11/21/2015 -  Chemotherapy    Hydrea 1000 mg and Allopurinol 300 mg daily (due to high uric acid level)      11/21/2015 Tumor Marker    BCR P210 Ratio of Fusion to Control, Blood 0.8941  P210 IS % Ratio, Blood 85.834%        01/02/2016 - 10/23/2016 Chemotherapy    Tasigna      01/08/2016 Miscellaneous    EKG- QT/QTc: 382/440 ms      01/16/2016 Miscellaneous    EKG- QT/QTc: 364/419      03/30/2016 Tumor Marker    b2a2 transcript: 0.317 b3a2 transcript: 0.194      10/23/2016 - 11/05/2016 Hospital Admission    large Left Pleural Effusion and Pericardial Effusion that is now s/p VATS with Drainage, and Drainage of Pericardial Fluid with Window and Biopsy of Pericardium      12/06/2016 -  Chemotherapy    Tasigna  restarted       05/24/2017 Adverse Reaction    Prolonged QTc on EKG; likely d/t chemo. Tasigna ON HOLD until next visit.       06/10/2017 -  Chemotherapy    Resumed Tasigna      07/13/2017 Miscellaneous    Evaluated by Cardiology for prolonged QTc. Per Dr. Bronson Ing, patient's QT intervals are not prolonged to the point of potentially promoting a ventricular arrhythmia. He thinks she can continue on current medical therapy for her CML with serial ECG monitoring.      12/27/2017 Miscellaneous    EKG: QT/QTc 398/435        Problem List Patient Active Problem List   Diagnosis Date Noted  . Sweating abnormality [L74.9] 10/11/2017  . Iron deficiency anemia [D50.9] 02/16/2017  . Pleural effusion [J90] 10/23/2016  . Solitary pulmonary nodule [R91.1] 10/23/2016  . Anemia [D64.9] 11/21/2015  . Bladder prolapse, female, acquired [N81.10] 11/21/2015  . IBS (irritable bowel syndrome) [K58.9] 11/21/2015  . Neutrophilic leukocytosis [W88.8] 11/21/2015  . OA (osteoarthritis) of knee [M17.10] 11/21/2015  . CML (chronic myeloid leukemia) ([place]) [C92.10] 11/20/2015  . Biliary dyskinesia [K82.8] 12/04/2013  . Dyspepsia [R10.13] 11/30/2012  . Rib pain [R07.81] 11/01/2011  . Colon cancer screening [Z12.11] 09/21/2011  . Lipoma [214] 07/27/2011  . Lipoma of arm [D17.20] 07/27/2011  . Mass of arm [R22.30] 07/08/2011  . PUD [K27.9] 05/20/2010  . ABDOMINAL PAIN RIGHT UPPER QUADRANT [R10.11] 02/12/2010  . Hypothyroidism [E03.9] 02/11/2010  . NAUSEA [R11.0] 02/11/2010  . Emiliano Dyer OF [B16.945] 02/11/2010    Past Medical History Past Medical History:  Diagnosis Date  . 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 Gastric ulcer 2011   EGD, 5/11  . Hypothyroidism    not on meds, followed by Dr. Elyse Hsu  . Leukocytosis 11/20/2015  . Lipoma    left upper arm  . PONV (postoperative nausea and vomiting)   . UTI (lower urinary tract infection)    frequent     Past Surgical History Past Surgical History:  Procedure Laterality Date  . BRAVO [place] STUDY  12/04/2012   Procedure: BRAVO [place];  Surgeon: [name], MD;  [facility];  Service: Endoscopy;  Laterality: N/A;  . CHOLECYSTECTOMY N/A 01/03/2014   Procedure: LAPAROSCOPIC CHOLECYSTECTOMY WITH INTRAOPERATIVE CHOLANGIOGRAM;  Surgeon: Joyice Faster. Cornett, MD;  Location: [place];  Service: General;  Laterality: N/A;  . COLONOSCOPY  01/2004   DR [facility], few small tics  . ESOPHAGOGASTRODUODENOSCOPY  02/2010   gastric ulcers  . ESOPHAGOGASTRODUODENOSCOPY  12/04/2012   WTU:UEKCMKL gastritis (inflammation) was found in the gastric antrum; multiple biopsies The duodenal mucosa showed no abnormalities in the bulb and second portion of the duodenum  . LIPOMA EXCISION  08/02/2011   left shoulder  . NOSE  SURGERY    . PARTIAL HYSTERECTOMY     vaginal at age 59 years of age  . TOE DEBRIDEMENT  1962   lt great toe  . VIDEO ASSISTED THORACOSCOPY (VATS)/EMPYEMA Left 10/25/2016   Procedure: VIDEO ASSISTED THORACOSCOPY (VATS), BRONCH,DRAINAGE OF PLEURAL EFFUSION,PERICARDIAL WINDOW WITH DRAINAGE OF PERICARDIAL FLUID, TEE;  Surgeon: [name], MD;  Location: [place];  Service: Thoracic;  Laterality: Left;  Marland Kitchen VIDEO BRONCHOSCOPY N/A 10/25/2016   Procedure: VIDEO BRONCHOSCOPY;  Surgeon: [name], MD;  Location: [facility] OR;  Service: Thoracic;  Laterality: N/A;    Family History Family History  Problem Relation Age of Onset  . Lung cancer Father   . COPD Mother   . Colon cancer Neg Hx   . Anesthesia problems Neg Hx   . Hypotension Neg Hx   . Malignant hyperthermia Neg Hx   . Pseudochol deficiency Neg Hx      Social History  reports that she has never smoked. She has never used smokeless tobacco. She reports that she does not drink alcohol or use drugs.  Medications  Current Outpatient Medications:  .  cetirizine (ZYRTEC) 10 MG tablet, Take 10 mg by  mouth daily. , Disp: , Rfl:  .  Cholecalciferol (VITAMIN D-3) 5000 UNITS TABS, Take 5,000 Units by mouth daily.  , Disp: , Rfl:  .  escitalopram (LEXAPRO) 20 MG tablet, TAKE 1 AND 1/2 TABLET DAILY, Disp: 60 tablet, Rfl: 5 .  levothyroxine (SYNTHROID, LEVOTHROID) 125 MCG tablet, Take 1 tablet (125 mcg total) by mouth daily before breakfast., Disp: 90 tablet, Rfl: 2 .  Multiple Vitamins-Minerals (CENTRUM SILVER PO), Take 1 capsule by mouth daily., Disp: , Rfl:  .  nilotinib (TASIGNA) 150 MG capsule, Take 2 capsules (300 mg total) by mouth every 12 (twelve) hours., Disp: 120 capsule, Rfl: 11 .  traMADol (ULTRAM) 50 MG tablet, Take 1 tablet (50 mg total) by mouth every 6 (six) hours as needed., Disp: 90 tablet, Rfl: 0 .  vitamin B-12 (CYANOCOBALAMIN) 1000 MCG tablet, Take 1,000 mcg by mouth daily. , Disp: , Rfl:   Allergies Codeine; Esomeprazole magnesium; Sulfonamide derivatives; and Fluconazole  Review of Systems Review of Systems - Oncology ROS as per HPI otherwise 12 point ROS is negative.   Physical Exam  Vitals Wt Readings from Last 3 Encounters:  04/07/18 219 lb (99.3 kg)  01/10/18 216 lb (98 kg)  12/27/17 217 lb 6.4 oz (98.6 kg)   Temp Readings from Last 3 Encounters:  04/07/18 98.4 F (36.9 C) (Oral)  12/27/17 98.5 F (36.9 C) (Oral)  09/13/17 98.3 F (36.8 C) (Oral)   BP Readings from Last 3 Encounters:  04/07/18 (!) 134/58  01/10/18 127/83  12/27/17 (!) 108/57   Pulse Readings from Last 3 Encounters:  04/07/18 84  01/10/18 81  12/27/17 77   Constitutional: Well-developed, well-nourished, and in no distress.   HENT: Head: Normocephalic and atraumatic.  Mouth/Throat: No oropharyngeal exudate. Mucosa moist. Eyes: Pupils are equal, round, and reactive to light. Conjunctivae are normal. No scleral icterus.  Neck: Normal range of motion. Neck supple. No JVD present.  Cardiovascular: Normal rate, regular rhythm and normal heart sounds.  Exam reveals no gallop and no  friction rub.   No murmur heard. Pulmonary/Chest: Effort normal and breath sounds normal. No respiratory distress. No wheezes.No rales.  Abdominal: Soft. Bowel sounds are normal. No distension. There is no tenderness. There is no guarding.  Musculoskeletal: No edema or tenderness.  Lymphadenopathy: No cervical, axillary  or supraclavicular adenopathy.  Neurological: Alert and oriented to person, place, and time. No cranial nerve deficit.  Skin: Skin is warm and dry. No rash noted. No erythema. No pallor.  Psychiatric: Affect and judgment normal.   Labs Appointment on 04/07/2018  Component Date Value Ref Range Status  . WBC 04/07/2018 10.7* 4.0 - 10.5 K/uL Final  . RBC 04/07/2018 4.25  3.87 - 5.11 MIL/uL Final  . Hemoglobin 04/07/2018 11.9* 12.0 - 15.0 g/dL Final  . HCT 04/07/2018 38.1  36.0 - 46.0 % Final  . MCV 04/07/2018 89.6  78.0 - 100.0 fL Final  . MCH 04/07/2018 28.0  26.0 - 34.0 pg Final  . MCHC 04/07/2018 31.2  30.0 - 36.0 g/dL Final  . RDW 04/07/2018 13.4  11.5 - 15.5 % Final  . Platelets 04/07/2018 250  150 - 400 K/uL Final  . Neutrophils Relative % 04/07/2018 75  % Final  . Neutro Abs 04/07/2018 8.0* 1.7 - 7.7 K/uL Final  . Lymphocytes Relative 04/07/2018 15  % Final  . Lymphs Abs 04/07/2018 1.6  0.7 - 4.0 K/uL Final  . Monocytes Relative 04/07/2018 8  % Final  . Monocytes Absolute 04/07/2018 0.8  0.1 - 1.0 K/uL Final  . Eosinophils Relative 04/07/2018 2  % Final  . Eosinophils Absolute 04/07/2018 0.2  0.0 - 0.7 K/uL Final  . Basophils Relative 04/07/2018 0  % Final  . Basophils Absolute 04/07/2018 0.0  0.0 - 0.1 K/uL Final   Performed at [facility], [address], [place], [place] [address]  . Sodium 04/07/2018 139  135 - 145 mmol/L Final  . Potassium 04/07/2018 4.5  3.5 - 5.1 mmol/L Final  . Chloride 04/07/2018 102  101 - 111 mmol/L Final  . CO2 04/07/2018 24  22 - 32 mmol/L Final  . Glucose, Bld 04/07/2018 97  65 - 99 mg/dL Final  . BUN 04/07/2018 23* 6 - 20 mg/dL  Final  . Creatinine, Ser 04/07/2018 0.82  0.44 - 1.00 mg/dL Final  . Calcium 04/07/2018 9.2  8.9 - 10.3 mg/dL Final  . Total Protein 04/07/2018 7.4  6.5 - 8.1 g/dL Final  . Albumin 04/07/2018 3.9  3.5 - 5.0 g/dL Final  . AST 04/07/2018 29  15 - 41 U/L Final  . ALT 04/07/2018 24  14 - 54 U/L Final  . Alkaline Phosphatase 04/07/2018 190* 38 - 126 U/L Final  . Total Bilirubin 04/07/2018 1.3* 0.3 - 1.2 mg/dL Final  . GFR calc non Af Amer 04/07/2018 >60  >60 mL/min Final  . GFR calc Af Amer 04/07/2018 >60  >60 mL/min Final   Comment: (NOTE) The eGFR has been calculated using the CKD EPI equation. This calculation has not been validated in all clinical situations. eGFR's persistently <60 mL/min signify possible Chronic Kidney Disease.   Georgiann Hahn gap 04/07/2018 13  5 - 15 Final   Performed at [facility], [address] [place], [place] [address]  . Magnesium 04/07/2018 2.0  1.7 - 2.4 mg/dL Final   Performed at [facility], [address] [place], [place] [address]     Pathology Orders Placed This Encounter  Procedures  . DG Chest 2 View    Standing Status:   Future    Number of Occurrences:   1    Standing Expiration Date:   04/07/2019    Order Specific Question:   Reason for Exam (SYMPTOM  OR DIAGNOSIS REQUIRED)    Answer:   sob, pleural effusion evaluation  Order Specific Question:   Preferred imaging location?    Answer:   [facility]    Order Specific Question:   Radiology Contrast Protocol - do NOT remove file path    Answer:   _0 [email or web address]  . CBC with Differential/Platelet    Standing Status:   Future    Standing Expiration Date:   04/08/2019  . Comprehensive metabolic panel    Standing Status:   Future    Standing Expiration Date:   04/08/2019  . Lactate dehydrogenase    Standing Status:   Future    Standing Expiration Date:   04/08/2019  . BCR-ABL1, CML/ALL, PCR, QUANT    Standing Status:   Future    Standing  Expiration Date:   04/08/2019  . EKG 12-Lead    Standing Status:   Future    Standing Expiration Date:   04/08/2019       Zoila Shutter MD

## 2018-04-17 ENCOUNTER — Other Ambulatory Visit (HOSPITAL_COMMUNITY): Payer: Self-pay | Admitting: Adult Health

## 2018-04-17 DIAGNOSIS — C9210 Chronic myeloid leukemia, BCR/ABL-positive, not having achieved remission: Secondary | ICD-10-CM

## 2018-04-19 ENCOUNTER — Encounter (HOSPITAL_COMMUNITY): Payer: Self-pay | Admitting: Adult Health

## 2018-04-19 NOTE — Progress Notes (Signed)
Received refill request from pharmacy for Tramadol.   [place] Controlled Substance Reporting System reviewed and refill is appropriate on or after 04/19/18. Medication e-scribed to her pharmacy ([facility]) using [employer]'s 2-step verification process.    [place]CCSRS reviewed:     Mike Craze, NP [place] [phone]

## 2018-04-26 ENCOUNTER — Encounter: Payer: Self-pay | Admitting: Physician Assistant

## 2018-04-26 ENCOUNTER — Ambulatory Visit: Payer: Medicare Other | Admitting: Physician Assistant

## 2018-04-26 NOTE — Progress Notes (Addendum)
Cardiology Office Note    Date:  04/27/2018  ID:  Andrea Martinez, Andrea Martinez 1946/07/04, MRN [id number] PCP:  Sinda Du, MD  Cardiologist:  Dr. Bronson Ing  Chief Complaint: abnormal EKG  History of Present Illness:  Andrea Martinez is a 72 y.o. female with history of hypothyroidism, CML, HCAP complicated by pericardial effusion requiring pericardial window 10/2016 and large L pleural effusion requring VATS, prolonged QT interval, hypothyroidism who presents for f/u.  She was evaluated for the prolonged QT by Dr. Bronson Ing 07/2017. She had been on Tasigna which was held for prolonged QT interval. QTc 429 ms in January 2017, QTc 441 ms on 05/24/2017, QTc 451 ms on 06/10/17. Tasigna was resumed on 06/11/17 and QTC was found to be 438 ms on 07/11/17. He did not feel any further management was necessary for this. Last echo (TEE) 10/2016: LV cavity small, EF 50-55%, no obvious RWMA, left pleural effusion, small circumferential pericardial effusion, mildly reduced RV systolic function - this was at the time of her acute illness.  She returns for overdue follow-up and evaluation of EKG as suggested by her oncologist. Her EKG recently showed a PAC. She had a CXR show chronic borderline cardiomegaly which was comparing to prior imaging in 2018. She had a chest CT in 07/2017 which did not show any recurrence of pericardial effusion. In general from cardiac standpoint she is asymptomatic without any chest pain, dyspnea, palpitations or syncope. Her major complaint is sweating which started when she was diagnosed with CML many years ago. She is followed by endocrinology for her thyroid disease.  Most recent labs - 04/07/18 K 4.5, BUN 23, Cr 0.82, AST/ALT wnl, alk phos appearing chronically elevated, WBC 10.7, Hgb 11.9, Plt 250; 12/2017 TSH nl and Ft4 1.16 (sl elevated).   Past Medical History:  Diagnosis Date  . 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 Gastric ulcer 2011   EGD, 5/11    . Hypothyroidism    not on meds, followed by Dr. Elyse Hsu  . Leukocytosis 11/20/2015  . Lipoma    left upper arm  . Pericardial effusion    a. HCAP complicated by pericardial effusion requiring pericardial window 10/2016 and large L pleural effusion requring VATS.  Marland Kitchen Pleural effusion    a. s/p VATS 2017.  Marland Kitchen PONV (postoperative nausea and vomiting)   . Prolonged QT interval   . UTI (lower urinary tract infection)    frequent    Past Surgical History:  Procedure Laterality Date  . BRAVO [place] STUDY  12/04/2012   Procedure: BRAVO [place];  Surgeon: [name], MD;  [facility];  Service: Endoscopy;  Laterality: N/A;  . CHOLECYSTECTOMY N/A 01/03/2014   Procedure: LAPAROSCOPIC CHOLECYSTECTOMY WITH INTRAOPERATIVE CHOLANGIOGRAM;  Surgeon: Joyice Faster. Cornett, MD;  Location: [place];  Service: General;  Laterality: N/A;  . COLONOSCOPY  01/2004   DR [facility], few small tics  . ESOPHAGOGASTRODUODENOSCOPY  02/2010   gastric ulcers  . ESOPHAGOGASTRODUODENOSCOPY  12/04/2012   IBB:CWUGQBV gastritis (inflammation) was found in the gastric antrum; multiple biopsies The duodenal mucosa showed no abnormalities in the bulb and second portion of the duodenum  . LIPOMA EXCISION  08/02/2011   left shoulder  . NOSE SURGERY    . PARTIAL HYSTERECTOMY     vaginal at age 89 years of age  . TOE DEBRIDEMENT  1962   lt great toe  . VIDEO ASSISTED THORACOSCOPY (VATS)/EMPYEMA Left 10/25/2016   Procedure: VIDEO ASSISTED THORACOSCOPY (  VATS), BRONCH,DRAINAGE OF PLEURAL EFFUSION,PERICARDIAL WINDOW WITH DRAINAGE OF PERICARDIAL FLUID, TEE;  Surgeon: [name], MD;  Location: [place];  Service: Thoracic;  Laterality: Left;  Marland Kitchen VIDEO BRONCHOSCOPY N/A 10/25/2016   Procedure: VIDEO BRONCHOSCOPY;  Surgeon: [name], MD;  Location: [facility] OR;  Service: Thoracic;  Laterality: N/A;    Current Medications: Current Meds  Medication Sig  . cetirizine (ZYRTEC) 10 MG tablet Take 10  mg by mouth daily.   . Cholecalciferol (VITAMIN D-3) 5000 UNITS TABS Take 5,000 Units by mouth daily.    Marland Kitchen escitalopram (LEXAPRO) 20 MG tablet TAKE 1 AND 1/2 TABLET DAILY  . levothyroxine (SYNTHROID, LEVOTHROID) 125 MCG tablet Take 1 tablet (125 mcg total) by mouth daily before breakfast.  . Multiple Vitamins-Minerals (CENTRUM SILVER PO) Take 1 capsule by mouth daily.  . nilotinib (TASIGNA) 150 MG capsule Take 2 capsules (300 mg total) by mouth every 12 (twelve) hours.  . traMADol (ULTRAM) 50 MG tablet TAKE 1 TABLET (50 MG TOTAL) BY MOUTH EVERY 6 (SIX) HOURS AS NEEDED.  Marland Kitchen vitamin B-12 (CYANOCOBALAMIN) 1000 MCG tablet Take 1,000 mcg by mouth daily.      Allergies:   Codeine; Esomeprazole magnesium; Sulfonamide derivatives; and Fluconazole   Social History   Socioeconomic History  . Marital status: Married    Spouse name: Not on file  . Number of children: 2  . Years of education: 12th grade  . Highest education level: Not on file  Occupational History  . Occupation: retired    Fish farm manager: UENPLOYED    Employer: RETIRED  Social Needs  . Financial resource strain: Not on file  . Food insecurity:    Worry: Not on file    Inability: Not on file  . Transportation needs:    Medical: Not on file    Non-medical: Not on file  Tobacco Use  . Smoking status: Never Smoker  . Smokeless tobacco: Never Used  Substance and Sexual Activity  . Alcohol use: No    Alcohol/week: 0.0 oz  . Drug use: No  . Sexual activity: Yes    Partners: Male    Birth control/protection: None  Lifestyle  . Physical activity:    Days per week: Not on file    Minutes per session: Not on file  . Stress: Not on file  Relationships  . Social connections:    Talks on phone: Not on file    Gets together: Not on file    Attends religious service: Not on file    Active member of club or organization: Not on file    Attends meetings of clubs or organizations: Not on file    Relationship status: Not on file    Other Topics Concern  . Not on file  Social History Narrative  . Not on file     Family History:  Family History  Problem Relation Age of Onset  . Lung cancer Father   . COPD Mother   . Colon cancer Neg Hx   . Anesthesia problems Neg Hx   . Hypotension Neg Hx   . Malignant hyperthermia Neg Hx   . Pseudochol deficiency Neg Hx     ROS:   Please see the history of present illness. + generalized fatigue which she attributes to poor sleep. Discussed OSA eval and asked her to think about it - she did not make a decision today. All other systems are reviewed and otherwise negative.    PHYSICAL EXAM:   VS:  BP  128/62   Pulse 88   Ht _0  (1.676 m)   Wt 219 lb (99.3 kg)   SpO2 95%   BMI 35.35 kg/m   BMI: Body mass index is 35.35 kg/m. GEN: Well nourished, well developed WF, in no acute distress  HEENT: normocephalic, atraumatic Neck: no JVD, carotid bruits, or masses Cardiac: RRR; no murmurs, rubs, or gallops, no edema (stocky baseline leg habitus which pt states is chronic) Respiratory:  clear to auscultation bilaterally, normal work of breathing GI: soft, nontender, nondistended, + BS MS: no deformity or atrophy  Skin: warm and dry, no rash Neuro:  Alert and Oriented x 3, Strength and sensation are intact, follows commands Psych: euthymic mood, full affect  Wt Readings from Last 3 Encounters:  04/27/18 219 lb (99.3 kg)  04/07/18 219 lb (99.3 kg)  01/10/18 216 lb (98 kg)      Studies/Labs Reviewed:   EKG:  EKG was ordered today and personally reviewed by me and demonstrates NSR 82bpm, nonspecific TW changes but otherwise on acute changes. QT 351m. QTc calculated 446m  Recent Labs: 12/27/2017: TSH 0.626 04/07/2018: ALT 24; BUN 23; Creatinine, Ser 0.82; Hemoglobin 11.9; Magnesium 2.0; Platelets 250; Potassium 4.5; Sodium 139   Lipid Panel No results found for: CHOL, TRIG, HDL, CHOLHDL, VLDL, LDLCALC, LDLDIRECT  Additional studies/ records that were reviewed  today include: Summarized above.    ASSESSMENT & PLAN:   1. Abnormal EKG (PAC) - EKG from 03/2018 reviewed. No specific concerning abnormality recognized except one PAC which is likely benign. She is asymptomatic from cardiac perspective and recent lytes were wnl. Therefore no further workup is indicated at present time. Discussed surveillance of any cardiac symptoms. EKG today shows no acute changes and no ectopy. 2. H/o pericardial effusion - no clinical recurrence by symptoms. Recent imaging within the last 12 months showed no recurrence of pericardial effusion (CT 07/2017). 3. H/o prolonged QT - resolved, no longer seems to be an active issue. Continue to monitor yearly. 4. Sweating abnormality - recheck TSH and free T4 today per pt's request - borderline Ft4 elevation several months ago. Her sweating goes back to her leukemia dx so she will continue to follow with oncology for evaluation. These are mostly night sweats but she does have some resting day sweats as well. These are not associated with any cardiac sx.  Disposition: F/u with Dr. KoBronson Ing year.  Medication Adjustments/Labs and Tests Ordered: Current medicines are reviewed at length with the patient today.  Concerns regarding medicines are outlined above. Medication changes, Labs and Tests ordered today are summarized above and listed in the Patient Instructions accessible in Encounters.   Signed, DaCharlie PitterPA-C  04/27/2018 1:42 PM    [place] [id number]: [phone] [phone]

## 2018-04-27 ENCOUNTER — Other Ambulatory Visit (HOSPITAL_COMMUNITY)
Admission: RE | Admit: 2018-04-27 | Discharge: 2018-04-27 | Disposition: A | Discharge status: Home / Self Care | Payer: Medicare Other | Source: Ambulatory Visit | Attending: Physician Assistant | Admitting: Physician Assistant

## 2018-04-27 ENCOUNTER — Ambulatory Visit: Payer: Medicare Other | Admitting: Physician Assistant

## 2018-04-27 ENCOUNTER — Encounter: Payer: Self-pay | Admitting: Physician Assistant

## 2018-04-27 VITALS — BP 128/62 | HR 88 | Ht 66.0 in | Wt 219.0 lb

## 2018-04-27 DIAGNOSIS — I3139 Other pericardial effusion (noninflammatory): Secondary | ICD-10-CM

## 2018-04-27 DIAGNOSIS — Z87898 Personal history of other specified conditions: Secondary | ICD-10-CM | POA: Diagnosis not present

## 2018-04-27 DIAGNOSIS — L749 Eccrine sweat disorder, unspecified: Secondary | ICD-10-CM | POA: Diagnosis not present

## 2018-04-27 DIAGNOSIS — I313 Pericardial effusion (noninflammatory): Secondary | ICD-10-CM

## 2018-04-27 DIAGNOSIS — I491 Atrial premature depolarization: Secondary | ICD-10-CM

## 2018-04-27 DIAGNOSIS — R9431 Abnormal electrocardiogram [ECG] [EKG]: Secondary | ICD-10-CM | POA: Diagnosis not present

## 2018-04-27 LAB — T4, FREE: Free T4: 1.06 ng/dL (ref 0.82–1.77)

## 2018-04-27 LAB — TSH: TSH: 2.595 u[IU]/mL (ref 0.350–4.500)

## 2018-04-27 NOTE — Patient Instructions (Signed)
Medication Instructions:  Your physician recommends that you continue on your current medications as directed. Please refer to the Current Medication list given to you today.   Labwork: Your physician recommends that you return for lab work today.    Testing/Procedures: NONE   Follow-Up: Your physician recommends that you schedule a follow-up appointment in: 1 Year.    Any Other Special Instructions Will Be Listed Below (If Applicable).     If you need a refill on your cardiac medications before your next appointment, please call your pharmacy.  Thank you for choosing [facility]!

## 2018-04-28 ENCOUNTER — Telehealth: Payer: Self-pay | Admitting: *Deleted

## 2018-04-28 DIAGNOSIS — N8110 Cystocele, unspecified: Secondary | ICD-10-CM | POA: Diagnosis not present

## 2018-04-28 NOTE — Telephone Encounter (Signed)
Called patient with test results. No answer. Left message to call back.  

## 2018-04-28 NOTE — Telephone Encounter (Signed)
-----   Message from Charlie Pitter, [place] sent at 04/28/2018  7:41 AM EDT ----- Please let pt know thyroid is totally normal. Melina Copa PA-C

## 2018-05-05 DIAGNOSIS — M1711 Unilateral primary osteoarthritis, right knee: Secondary | ICD-10-CM | POA: Diagnosis not present

## 2018-05-05 DIAGNOSIS — M25561 Pain in right knee: Secondary | ICD-10-CM | POA: Diagnosis not present

## 2018-05-05 DIAGNOSIS — M1712 Unilateral primary osteoarthritis, left knee: Secondary | ICD-10-CM | POA: Diagnosis not present

## 2018-05-05 DIAGNOSIS — M25562 Pain in left knee: Secondary | ICD-10-CM | POA: Diagnosis not present

## 2018-05-09 DIAGNOSIS — N8110 Cystocele, unspecified: Secondary | ICD-10-CM | POA: Diagnosis not present

## 2018-05-15 DIAGNOSIS — N8110 Cystocele, unspecified: Secondary | ICD-10-CM | POA: Diagnosis not present

## 2018-05-18 DIAGNOSIS — N8111 Cystocele, midline: Secondary | ICD-10-CM | POA: Diagnosis not present

## 2018-06-01 ENCOUNTER — Telehealth (HOSPITAL_COMMUNITY): Payer: Self-pay

## 2018-06-01 DIAGNOSIS — C9210 Chronic myeloid leukemia, BCR/ABL-positive, not having achieved remission: Secondary | ICD-10-CM

## 2018-06-01 MED ORDER — NILOTINIB HCL 150 MG PO CAPS: 300.0000 mg | ORAL_CAPSULE | Freq: Two times a day (BID) | ORAL | 1 refills | Status: DC

## 2018-06-01 NOTE — Telephone Encounter (Signed)
Received refill request from patients pharmacy for Tasiigna. Reviewed with provider, chart checked and refilled.

## 2018-06-13 DIAGNOSIS — M1712 Unilateral primary osteoarthritis, left knee: Secondary | ICD-10-CM | POA: Diagnosis not present

## 2018-06-13 DIAGNOSIS — M1711 Unilateral primary osteoarthritis, right knee: Secondary | ICD-10-CM | POA: Diagnosis not present

## 2018-06-16 ENCOUNTER — Other Ambulatory Visit (HOSPITAL_COMMUNITY): Payer: Self-pay | Admitting: *Deleted

## 2018-06-16 DIAGNOSIS — C9210 Chronic myeloid leukemia, BCR/ABL-positive, not having achieved remission: Secondary | ICD-10-CM

## 2018-06-16 MED ORDER — NILOTINIB HCL 150 MG PO CAPS: 300.0000 mg | ORAL_CAPSULE | Freq: Two times a day (BID) | ORAL | 1 refills | Status: DC

## 2018-06-20 DIAGNOSIS — M1712 Unilateral primary osteoarthritis, left knee: Secondary | ICD-10-CM | POA: Diagnosis not present

## 2018-06-20 DIAGNOSIS — M1711 Unilateral primary osteoarthritis, right knee: Secondary | ICD-10-CM | POA: Diagnosis not present

## 2018-06-26 ENCOUNTER — Other Ambulatory Visit: Payer: Self-pay | Admitting: *Deleted

## 2018-06-26 DIAGNOSIS — R918 Other nonspecific abnormal finding of lung field: Secondary | ICD-10-CM

## 2018-06-27 DIAGNOSIS — M1711 Unilateral primary osteoarthritis, right knee: Secondary | ICD-10-CM | POA: Diagnosis not present

## 2018-06-27 DIAGNOSIS — M1712 Unilateral primary osteoarthritis, left knee: Secondary | ICD-10-CM | POA: Diagnosis not present

## 2018-07-03 ENCOUNTER — Inpatient Hospital Stay (HOSPITAL_COMMUNITY): Payer: Medicare Other | Attending: "Endocrinology

## 2018-07-03 DIAGNOSIS — E039 Hypothyroidism, unspecified: Secondary | ICD-10-CM | POA: Insufficient documentation

## 2018-07-03 DIAGNOSIS — Z9221 Personal history of antineoplastic chemotherapy: Secondary | ICD-10-CM | POA: Insufficient documentation

## 2018-07-03 DIAGNOSIS — R61 Generalized hyperhidrosis: Secondary | ICD-10-CM | POA: Diagnosis not present

## 2018-07-03 DIAGNOSIS — C9210 Chronic myeloid leukemia, BCR/ABL-positive, not having achieved remission: Secondary | ICD-10-CM | POA: Diagnosis not present

## 2018-07-03 DIAGNOSIS — D509 Iron deficiency anemia, unspecified: Secondary | ICD-10-CM | POA: Insufficient documentation

## 2018-07-03 DIAGNOSIS — M1990 Unspecified osteoarthritis, unspecified site: Secondary | ICD-10-CM | POA: Diagnosis not present

## 2018-07-03 DIAGNOSIS — Z79899 Other long term (current) drug therapy: Secondary | ICD-10-CM | POA: Insufficient documentation

## 2018-07-03 LAB — COMPREHENSIVE METABOLIC PANEL
ALT: 26 U/L (ref 0–44)
AST: 30 U/L (ref 15–41)
Albumin: 3.6 g/dL (ref 3.5–5.0)
Alkaline Phosphatase: 185 U/L — ABNORMAL HIGH (ref 38–126)
Anion gap: 8 (ref 5–15)
BUN: 21 mg/dL (ref 8–23)
CALCIUM: 9 mg/dL (ref 8.9–10.3)
CO2: 27 mmol/L (ref 22–32)
CREATININE: 0.84 mg/dL (ref 0.44–1.00)
Chloride: 105 mmol/L (ref 98–111)
Glucose, Bld: 119 mg/dL — ABNORMAL HIGH (ref 70–99)
Potassium: 4.7 mmol/L (ref 3.5–5.1)
SODIUM: 140 mmol/L (ref 135–145)
Total Bilirubin: 0.8 mg/dL (ref 0.3–1.2)
Total Protein: 7.4 g/dL (ref 6.5–8.1)

## 2018-07-03 LAB — CBC WITH DIFFERENTIAL/PLATELET
Basophils Absolute: 0 10*3/uL (ref 0.0–0.1)
Basophils Relative: 0 %
EOS ABS: 0.1 10*3/uL (ref 0.0–0.7)
Eosinophils Relative: 1 %
HCT: 37.5 % (ref 36.0–46.0)
Hemoglobin: 12.2 g/dL (ref 12.0–15.0)
LYMPHS ABS: 1.6 10*3/uL (ref 0.7–4.0)
LYMPHS PCT: 14 %
MCH: 29.6 pg (ref 26.0–34.0)
MCHC: 32.5 g/dL (ref 30.0–36.0)
MCV: 91 fL (ref 78.0–100.0)
MONOS PCT: 6 %
Monocytes Absolute: 0.7 10*3/uL (ref 0.1–1.0)
Neutro Abs: 8.8 10*3/uL — ABNORMAL HIGH (ref 1.7–7.7)
Neutrophils Relative %: 79 %
PLATELETS: 263 10*3/uL (ref 150–400)
RBC: 4.12 MIL/uL (ref 3.87–5.11)
RDW: 14.1 % (ref 11.5–15.5)
WBC: 11.3 10*3/uL — AB (ref 4.0–10.5)

## 2018-07-03 LAB — TSH: TSH: 0.346 u[IU]/mL — AB (ref 0.350–4.500)

## 2018-07-03 LAB — LACTATE DEHYDROGENASE: LDH: 139 U/L (ref 98–192)

## 2018-07-04 LAB — VITAMIN D 25 HYDROXY (VIT D DEFICIENCY, FRACTURES): Vit D, 25-Hydroxy: 42 ng/mL (ref 30.0–100.0)

## 2018-07-07 ENCOUNTER — Inpatient Hospital Stay (HOSPITAL_BASED_OUTPATIENT_CLINIC_OR_DEPARTMENT_OTHER): Payer: Medicare Other | Admitting: Internal Medicine

## 2018-07-07 ENCOUNTER — Other Ambulatory Visit (HOSPITAL_COMMUNITY): Payer: Medicare Other

## 2018-07-07 ENCOUNTER — Encounter (HOSPITAL_COMMUNITY): Payer: Self-pay | Admitting: Internal Medicine

## 2018-07-07 VITALS — BP 150/57 | HR 87 | Temp 98.1°F | Resp 16 | Wt 215.8 lb

## 2018-07-07 DIAGNOSIS — R61 Generalized hyperhidrosis: Secondary | ICD-10-CM

## 2018-07-07 DIAGNOSIS — D509 Iron deficiency anemia, unspecified: Secondary | ICD-10-CM

## 2018-07-07 DIAGNOSIS — M1990 Unspecified osteoarthritis, unspecified site: Secondary | ICD-10-CM | POA: Diagnosis not present

## 2018-07-07 DIAGNOSIS — Z9221 Personal history of antineoplastic chemotherapy: Secondary | ICD-10-CM

## 2018-07-07 DIAGNOSIS — Z79899 Other long term (current) drug therapy: Secondary | ICD-10-CM

## 2018-07-07 DIAGNOSIS — E039 Hypothyroidism, unspecified: Secondary | ICD-10-CM

## 2018-07-07 DIAGNOSIS — C9210 Chronic myeloid leukemia, BCR/ABL-positive, not having achieved remission: Secondary | ICD-10-CM | POA: Diagnosis not present

## 2018-07-07 NOTE — Patient Instructions (Addendum)
[place] at [facility] Discharge Instructions  You saw Dr. Walden Field today. We will call you with the BCR- ABL test results. Keep follow ups with Dr. Roxan Hockey and Dr. Dorris Fetch. We will see you back in 3-4 months for labs and follow up. Talk with Dr. Luan Pulling about your pain and depression medication. Try Vitamin E for sweating   Thank you for choosing [place] at [facility] to provide your oncology and hematology care.  To afford each patient quality time with our provider, please arrive at least 15 minutes before your scheduled appointment time.   If you have a lab appointment with the [facility] please come in thru the  Main Entrance and check in at the main information desk  You need to re-schedule your appointment should you arrive 10 or more minutes late.  We strive to give you quality time with our providers, and arriving late affects you and other patients whose appointments are after yours.  Also, if you no show three or more times for appointments you may be dismissed from the clinic at the providers discretion.     Again, thank you for choosing [facility].  Our hope is that these requests will decrease the amount of time that you wait before being seen by our physicians.       _____________________________________________________________  Should you have questions after your visit to [facility], please contact our office at [phone] between the hours of 8:00 a.m. and 4:30 p.m.  Voicemails left after 4:00 p.m. will not be returned until the following business day.  For prescription refill requests, have your pharmacy contact our office and allow 72 hours.    Cancer Center Support Programs:   > Cancer Support Group  2nd Tuesday of the month 1pm-2pm, Journey Room

## 2018-07-07 NOTE — Progress Notes (Signed)
Diagnosis Chronic myeloid leukemia, BCR/ABL-positive, not having achieved remission ([place]) - Plan: CBC with Differential/Platelet, Comprehensive metabolic panel, Lactate dehydrogenase, BCR-ABL1, CML/ALL, PCR, QUANT, T4 AND TSH  Staging Cancer Staging No matching staging information was found for the patient.  Assessment and Plan:  1.  CML, BCR/ABL +.  72 y.o. female with CML, BCR-ABL1+; started on Tasigna on 01/02/16. Treatment course complicated by left pleural effusion & pericardial effusion requiring pericardial window in 10/2016. Tasigna restarted on 12/06/16.   Most recent BCR-ABL labs done 12/2017 shows   continue to show improving b2a2 and b3a2 transcripts.  EKG 02/14/17 without QTc prolongation. With epeat EKG on 05/24/17 revealed prolonged QT/QTc 406/441. Tasigna was placed ON HOLD on 05/24/17 as a result.  Baseline EKG from 12/2015 QT/QTc was 372/429 at that time.  Resumed tasigna on 06/11/17.  Patient has been evaluated by cardiology who stated that it was safe for her to resume her tasigna.   EKG performed 04/07/2018 shows QT/QTc 382/438. Prior Prolonged QTc could be due to her underlying uncontrolled hypothyroidism for the past few months.  She also has evidence of low voltage and ? Anterior infarct.    Pt has been seen by cardiology with reported negative evaluation.  Follow-up with Cardiology as directed.   EKGs should be done by cardiology.    Continue Tasigna.  Last BCR/ABL done 12/27/2017 was 0.0712.  Level pending today and pt will be notified of results and may extend follow-up to every 6 months pending results.    2.  Constant sweating.  Pt is on Vitamin E.  I have recommended she also discuss this with endocrine and PCP.  Likely menopausal symptoms.    3.  Joint pain and request for pain medications.  She reports she has been seen by orthopedics and receives periodic injections.  She reports she has been told that she may need knee replacement.  I have discussed with her to  follow-up With orthopedics for ongoing management.  She should also discuss pain meds with PCP.  She was given option of referral to pain clinic but reports she will discuss this with PCP.    4.  Pleural effusion.  This occurred in 2017.  CXR done 03/2018 showed scarring and cardiomegaly.  She is advised to follow-up with PCP and CT surgery.    5.  Pulmonary nodules.  Pt had CT chest done 07/2017 that showed bilateral pulmonary nodules.  She is followed by Dr. Roxan Hockey.  She was recommended for CT chest in 07/2018.  She reports she will follow-up with Dr. Roxan Hockey for scans and follow-up.    6.  Hypothyroidism.  She is on Synthroid.  Pt had TSH done 07/07/2018 that was 0.346.  Pt should continue to  follow-up with Dr. Luan Pulling or Dorris Fetch for management.  Results will be forwarded to Dr. Dorris Fetch for review.   Greater than 35 minutes spent with more than 50% spent in counseling and coordination of care.    Interval History:   72 year old female previously followed by Dr. Talbert Cage.  Pt was seen by cardiology on 07/13/2017 for her prolonged QTc. Per Dr. Bronson Ing, patient's QT intervals are not prolonged to the point of potentially promoting a ventricular arrhythmia. He thinks she can continue on current medical therapy for her CML with serial ECG monitoring.   Current Status:  Pt is seen today for follow-up.  She is requesting Rx for ultram.  She continues to report constant sweating.     CML (chronic myeloid leukemia) (  [place])   11/21/2015 Initial Diagnosis    Chronic myelogenous leukemia (CML), BCR-ABL1-positive ([place])      11/21/2015 Miscellaneous    Se[facility] by Dr. [facility]      11/21/2015 -  Chemotherapy    Hydrea 1000 mg and Allopurinol 300 mg daily (due to high uric acid level)      11/21/2015 Tumor Marker    BCR P210 Ratio of Fusion to Control, Blood 0.8941    P210 IS % Ratio, Blood 85.834%        01/02/2016 - 10/23/2016 Chemotherapy    Tasigna      01/08/2016 Miscellaneous    EKG- QT/QTc:  382/440 ms      01/16/2016 Miscellaneous    EKG- QT/QTc: 364/419      03/30/2016 Tumor Marker    b2a2 transcript: 0.317 b3a2 transcript: 0.194      10/23/2016 - 11/05/2016 Hospital Admission    large Left Pleural Effusion and Pericardial Effusion that is now s/p VATS with Drainage, and Drainage of Pericardial Fluid with Window and Biopsy of Pericardium      12/06/2016 -  Chemotherapy    Tasigna restarted       05/24/2017 Adverse Reaction    Prolonged QTc on EKG; likely d/t chemo. Tasigna ON HOLD until next visit.       06/10/2017 -  Chemotherapy    Resumed Tasigna      07/13/2017 Miscellaneous    Evaluated by Cardiology for prolonged QTc. Per Dr. Bronson Ing, patient's QT intervals are not prolonged to the point of potentially promoting a ventricular arrhythmia. He thinks she can continue on current medical therapy for her CML with serial ECG monitoring.      12/27/2017 Miscellaneous    EKG: QT/QTc 398/435        Problem List Patient Active Problem List   Diagnosis Date Noted  . Sweating abnormality [L74.9] 10/11/2017  . Iron deficiency anemia [D50.9] 02/16/2017  . Pleural effusion [J90] 10/23/2016  . Solitary pulmonary nodule [R91.1] 10/23/2016  . Anemia [D64.9] 11/21/2015  . Bladder prolapse, female, acquired [N81.10] 11/21/2015  . IBS (irritable bowel syndrome) [K58.9] 11/21/2015  . Neutrophilic leukocytosis [S96.2] 11/21/2015  . OA (osteoarthritis) of knee [M17.10] 11/21/2015  . CML (chronic myeloid leukemia) ([place]) [C92.10] 11/20/2015  . Biliary dyskinesia [K82.8] 12/04/2013  . Dyspepsia [R10.13] 11/30/2012  . Rib pain [R07.81] 11/01/2011  . Colon cancer screening [Z12.11] 09/21/2011  . Lipoma [214] 07/27/2011  . Lipoma of arm [D17.20] 07/27/2011  . Mass of arm [R22.30] 07/08/2011  . PUD [K27.9] 05/20/2010  . ABDOMINAL PAIN RIGHT UPPER QUADRANT [R10.11] 02/12/2010  . Hypothyroidism [E03.9] 02/11/2010  . NAUSEA [R11.0] 02/11/2010  . Emiliano Dyer OF [E36.629]  02/11/2010    Past Medical History Past Medical History:  Diagnosis Date  . Chronic myelogenous leukemia (CML), BCR-ABL1-positive ([place]) 11/20/2015  . Gastric ulcer 2011   EGD, 5/11  . Hypothyroidism    not on meds, followed by Dr. Elyse Hsu  . Leukocytosis 11/20/2015  . Lipoma    left upper arm  . Pericardial effusion    a. HCAP complicated by pericardial effusion requiring pericardial window 10/2016 and large L pleural effusion requring VATS.  Marland Kitchen Pleural effusion    a. s/p VATS 2017.  Marland Kitchen PONV (postoperative nausea and vomiting)   . Prolonged QT interval   . UTI (lower urinary tract infection)    frequent    Past Surgical History Past Surgical History:  Procedure Laterality Date  . BRAVO [place] STUDY  12/04/2012  Procedure: BRAVO [place];  Surgeon: [name], MD;  [facility];  Service: Endoscopy;  Laterality: N/A;  . CHOLECYSTECTOMY N/A 01/03/2014   Procedure: LAPAROSCOPIC CHOLECYSTECTOMY WITH INTRAOPERATIVE CHOLANGIOGRAM;  Surgeon: Joyice Faster. Cornett, MD;  Location: [place];  Service: General;  Laterality: N/A;  . COLONOSCOPY  01/2004   [facility], few small tics  . ESOPHAGOGASTRODUODENOSCOPY  02/2010   gastric ulcers  . ESOPHAGOGASTRODUODENOSCOPY  12/04/2012   [id number] gastritis (inflammation) was found in the gastric antrum; multiple biopsies The duodenal mucosa showed no abnormalities in the bulb and second portion of the duodenum  . LIPOMA EXCISION  08/02/2011   left shoulder  . NOSE SURGERY    . PARTIAL HYSTERECTOMY     vaginal at age 52 years of age  . TOE DEBRIDEMENT  1962   lt great toe  . VIDEO ASSISTED THORACOSCOPY (VATS)/EMPYEMA Left 10/25/2016   Procedure: VIDEO ASSISTED THORACOSCOPY (VATS), BRONCH,DRAINAGE OF PLEURAL EFFUSION,PERICARDIAL WINDOW WITH DRAINAGE OF PERICARDIAL FLUID, TEE;  Surgeon: [name], MD;  Location: [place];  Service: Thoracic;  Laterality: Left;  Marland Kitchen VIDEO BRONCHOSCOPY N/A 10/25/2016   Procedure:  VIDEO BRONCHOSCOPY;  Surgeon: [name], MD;  Location: [facility] OR;  Service: Thoracic;  Laterality: N/A;    Family History Family History  Problem Relation Age of Onset  . Lung cancer Father   . COPD Mother   . Colon cancer Neg Hx   . Anesthesia problems Neg Hx   . Hypotension Neg Hx   . Malignant hyperthermia Neg Hx   . Pseudochol deficiency Neg Hx      Social History  reports that she has never smoked. She has never used smokeless tobacco. She reports that she does not drink alcohol or use drugs.  Medications  Current Outpatient Medications:  .  cetirizine (ZYRTEC) 10 MG tablet, Take 10 mg by mouth daily. , Disp: , Rfl:  .  Cholecalciferol (VITAMIN D-3) 5000 UNITS TABS, Take 5,000 Units by mouth daily.  , Disp: , Rfl:  .  escitalopram (LEXAPRO) 20 MG tablet, TAKE 1 AND 1/2 TABLET DAILY, Disp: 60 tablet, Rfl: 5 .  levothyroxine (SYNTHROID, LEVOTHROID) 125 MCG tablet, Take 1 tablet (125 mcg total) by mouth daily before breakfast., Disp: 90 tablet, Rfl: 2 .  Multiple Vitamins-Minerals (CENTRUM SILVER PO), Take 1 capsule by mouth daily., Disp: , Rfl:  .  nilotinib (TASIGNA) 150 MG capsule, Take 2 capsules (300 mg total) by mouth every 12 (twelve) hours., Disp: 120 capsule, Rfl: 1 .  traMADol (ULTRAM) 50 MG tablet, TAKE 1 TABLET (50 MG TOTAL) BY MOUTH EVERY 6 (SIX) HOURS AS NEEDED., Disp: 90 tablet, Rfl: 0 .  vitamin B-12 (CYANOCOBALAMIN) 1000 MCG tablet, Take 1,000 mcg by mouth daily. , Disp: , Rfl:   Allergies Codeine; Esomeprazole magnesium; Sulfonamide derivatives; and Fluconazole  Review of Systems Review of Systems - Oncology ROS negative other than joint pain, sweating,    Physical Exam  Vitals Wt Readings from Last 3 Encounters:  07/07/18 215 lb 12.8 oz (97.9 kg)  04/27/18 219 lb (99.3 kg)  04/07/18 219 lb (99.3 kg)   Temp Readings from Last 3 Encounters:  07/07/18 98.1 F (36.7 C) (Oral)  04/07/18 98.4 F (36.9 C) (Oral)  12/27/17 98.5 F (36.9 C)  (Oral)   BP Readings from Last 3 Encounters:  07/07/18 (!) 150/57  04/27/18 128/62  04/07/18 (!) 134/58   Pulse Readings from Last 3 Encounters:  07/07/18 87  04/27/18 88  04/07/18 84    Constitutional: Well-developed, well-nourished, and in no distress.   HENT: Head: Normocephalic and atraumatic.  Mouth/Throat: No oropharyngeal exudate. Mucosa moist. Eyes: Pupils are equal, round, and reactive to light. Conjunctivae are normal. No scleral icterus.  Neck: Normal range of motion. Neck supple. No JVD present.  Cardiovascular: Normal rate, regular rhythm and normal heart sounds.  Exam reveals no gallop and no friction rub.   No murmur heard. Pulmonary/Chest: Effort normal and breath sounds normal. No respiratory distress. No wheezes.No rales.  Abdominal: Soft. Bowel sounds are normal. No distension. There is no tenderness. There is no guarding.  Musculoskeletal: No edema or tenderness.  Lymphadenopathy: No cervical, axillary or supraclavicular adenopathy.  Neurological: Alert and oriented to person, place, and time. No cranial nerve deficit.  Skin: Skin is warm and dry. No rash noted. No erythema. No pallor.  Psychiatric: Affect and judgment normal.   Labs No visits with results within 3 Day(s) from this visit.  Latest known visit with results is:  Lab on 07/03/2018  Component Date Value Ref Range Status  . WBC 07/03/2018 11.3* 4.0 - 10.5 K/uL Final  . RBC 07/03/2018 4.12  3.87 - 5.11 MIL/uL Final  . Hemoglobin 07/03/2018 12.2  12.0 - 15.0 g/dL Final  . HCT 07/03/2018 37.5  36.0 - 46.0 % Final  . MCV 07/03/2018 91.0  78.0 - 100.0 fL Final  . MCH 07/03/2018 29.6  26.0 - 34.0 pg Final  . MCHC 07/03/2018 32.5  30.0 - 36.0 g/dL Final  . RDW 07/03/2018 14.1  11.5 - 15.5 % Final  . Platelets 07/03/2018 263  150 - 400 K/uL Final  . Neutrophils Relative % 07/03/2018 79  % Final  . Neutro Abs 07/03/2018 8.8* 1.7 - 7.7 K/uL Final  . Lymphocytes Relative 07/03/2018 14  % Final  .  Lymphs Abs 07/03/2018 1.6  0.7 - 4.0 K/uL Final  . Monocytes Relative 07/03/2018 6  % Final  . Monocytes Absolute 07/03/2018 0.7  0.1 - 1.0 K/uL Final  . Eosinophils Relative 07/03/2018 1  % Final  . Eosinophils Absolute 07/03/2018 0.1  0.0 - 0.7 K/uL Final  . Basophils Relative 07/03/2018 0  % Final  . Basophils Absolute 07/03/2018 0.0  0.0 - 0.1 K/uL Final   Performed at [facility], [address], [place], [place] [address]  . Sodium 07/03/2018 140  135 - 145 mmol/L Final  . Potassium 07/03/2018 4.7  3.5 - 5.1 mmol/L Final  . Chloride 07/03/2018 105  98 - 111 mmol/L Final  . CO2 07/03/2018 27  22 - 32 mmol/L Final  . Glucose, Bld 07/03/2018 119* 70 - 99 mg/dL Final  . BUN 07/03/2018 21  8 - 23 mg/dL Final  . Creatinine, Ser 07/03/2018 0.84  0.44 - 1.00 mg/dL Final  . Calcium 07/03/2018 9.0  8.9 - 10.3 mg/dL Final  . Total Protein 07/03/2018 7.4  6.5 - 8.1 g/dL Final  . Albumin 07/03/2018 3.6  3.5 - 5.0 g/dL Final  . AST 07/03/2018 30  15 - 41 U/L Final  . ALT 07/03/2018 26  0 - 44 U/L Final  . Alkaline Phosphatase 07/03/2018 185* 38 - 126 U/L Final  . Total Bilirubin 07/03/2018 0.8  0.3 - 1.2 mg/dL Final  . GFR calc non Af Amer 07/03/2018 >60  >60 mL/min Final  . GFR calc Af Amer 07/03/2018 >60  >60 mL/min Final   Comment: (NOTE) The eGFR has been calculated using the CKD EPI equation. This calculation  has not been validated in all clinical situations. eGFR's persistently <60 mL/min signify possible Chronic Kidney Disease.   Georgiann Hahn gap 07/03/2018 8  5 - 15 Final   Performed at [facility], [address], [place], [place] [address]  . LDH 07/03/2018 139  98 - 192 U/L Final   Performed at [facility], [address] [place], [place] [address]  . Vit D, 25-Hydroxy 07/03/2018 42.0  30.0 - 100.0 ng/mL Final   Comment: (NOTE) Vitamin D deficiency has been defined by the [place] practice guideline as a level of serum 25-OH vitamin D less  than 20 ng/mL (1,2). The Endocrine Society went on to further define vitamin D insufficiency as a level between 21 and 29 ng/mL (2). 1. IOM (Institute of Medicine). 2010. Dietary reference   intakes for calcium and D. [place]: The   [employer]. 2. Holick MF, Binkley Porter, Bischoff-Ferrari HA, et al.   Evaluation, treatment, and prevention of vitamin D   deficiency: an Endocrine Society clinical practice   guideline. JCEM. 2011 Jul; 96(7):1911-30. Performed At: [facility] [place], [place] [id number] Rush Farmer MD EI:[id number]   . TSH 07/03/2018 0.346* 0.350 - 4.500 uIU/mL Final   Comment: Performed by a 3rd Generation assay with a functional sensitivity of <=0.01 uIU/mL. Performed at [facility], [address], [place], [place] [address]      Pathology Orders Placed This Encounter  Procedures  . CBC with Differential/Platelet    Standing Status:   Future    Standing Expiration Date:   07/09/2019  . Comprehensive metabolic panel    Standing Status:   Future    Standing Expiration Date:   07/09/2019  . Lactate dehydrogenase    Standing Status:   Future    Standing Expiration Date:   07/09/2019  . BCR-ABL1, CML/ALL, PCR, QUANT    Standing Status:   Future    Standing Expiration Date:   07/09/2019  . T4 AND TSH    Standing Status:   Future    Standing Expiration Date:   07/09/2019       Zoila Shutter MD

## 2018-07-10 LAB — BCR-ABL1, CML/ALL, PCR, QUANT
B2A2 TRANSCRIPT: 0.0314 %
B3A2 TRANSCRIPT: 0.0118 %

## 2018-07-13 ENCOUNTER — Encounter: Payer: Self-pay | Admitting: "Endocrinology

## 2018-07-13 ENCOUNTER — Ambulatory Visit: Payer: Medicare Other | Admitting: "Endocrinology

## 2018-07-13 VITALS — BP 136/70 | HR 80 | Ht 66.0 in | Wt 217.5 lb

## 2018-07-13 DIAGNOSIS — E039 Hypothyroidism, unspecified: Secondary | ICD-10-CM

## 2018-07-13 MED ORDER — CHOLECALCIFEROL 50 MCG (2000 UT) PO CAPS
1.0000 | ORAL_CAPSULE | Freq: Every day | ORAL | 6 refills | Status: DC
Start: 2018-07-13 — End: 2019-04-19

## 2018-07-13 MED ORDER — LEVOTHYROXINE SODIUM 125 MCG PO TABS: 125.0000 ug | ORAL_TABLET | Freq: Every day | ORAL | 2 refills | Status: DC

## 2018-07-13 NOTE — Progress Notes (Signed)
Endocrinology follow-up note   Subjective:    Patient ID: Andrea Martinez, female    DOB: 10-11-1946, PCP Sinda Du, MD   Past Medical History:  Diagnosis Date  . Chronic myelogenous leukemia (CML), BCR-ABL1-positive ([place]) 11/20/2015  . Gastric ulcer 2011   EGD, 5/11  . Hypothyroidism    not on meds, followed by Dr. Elyse Hsu  . Leukocytosis 11/20/2015  . Lipoma    left upper arm  . Pericardial effusion    a. HCAP complicated by pericardial effusion requiring pericardial window 10/2016 and large L pleural effusion requring VATS.  Marland Kitchen Pleural effusion    a. s/p VATS 2017.  Marland Kitchen PONV (postoperative nausea and vomiting)   . Prolonged QT interval   . UTI (lower urinary tract infection)    frequent   Past Surgical History:  Procedure Laterality Date  . BRAVO [place] STUDY  12/04/2012   Procedure: BRAVO [place];  Surgeon: [name], MD;  [facility];  Service: Endoscopy;  Laterality: N/A;  . CHOLECYSTECTOMY N/A 01/03/2014   Procedure: LAPAROSCOPIC CHOLECYSTECTOMY WITH INTRAOPERATIVE CHOLANGIOGRAM;  Surgeon: Joyice Faster. Cornett, MD;  Location: [place];  Service: General;  Laterality: N/A;  . COLONOSCOPY  01/2004   DR [facility], few small tics  . ESOPHAGOGASTRODUODENOSCOPY  02/2010   gastric ulcers  . ESOPHAGOGASTRODUODENOSCOPY  12/04/2012   INO:MVEHMCN gastritis (inflammation) was found in the gastric antrum; multiple biopsies The duodenal mucosa showed no abnormalities in the bulb and second portion of the duodenum  . LIPOMA EXCISION  08/02/2011   left shoulder  . NOSE SURGERY    . PARTIAL HYSTERECTOMY     vaginal at age 26 years of age  . TOE DEBRIDEMENT  1962   lt great toe  . VIDEO ASSISTED THORACOSCOPY (VATS)/EMPYEMA Left 10/25/2016   Procedure: VIDEO ASSISTED THORACOSCOPY (VATS), BRONCH,DRAINAGE OF PLEURAL EFFUSION,PERICARDIAL WINDOW WITH DRAINAGE OF PERICARDIAL FLUID, TEE;  Surgeon: [name], MD;  Location: [place];  Service: Thoracic;   Laterality: Left;  Marland Kitchen VIDEO BRONCHOSCOPY N/A 10/25/2016   Procedure: VIDEO BRONCHOSCOPY;  Surgeon: [name], MD;  Location: [place];  Service: Thoracic;  Laterality: N/A;   Social History   Socioeconomic History  . Marital status: Married    Spouse name: Not on file  . Number of children: 2  . Years of education: 12th grade  . Highest education level: Not on file  Occupational History  . Occupation: retired    Fish farm manager: UENPLOYED    Employer: RETIRED  Social Needs  . Financial resource strain: Not on file  . Food insecurity:    Worry: Not on file    Inability: Not on file  . Transportation needs:    Medical: Not on file    Non-medical: Not on file  Tobacco Use  . Smoking status: Never Smoker  . Smokeless tobacco: Never Used  Substance and Sexual Activity  . Alcohol use: No    Alcohol/week: 0.0 oz  . Drug use: No  . Sexual activity: Yes    Partners: Male    Birth control/protection: None  Lifestyle  . Physical activity:    Days per week: Not on file    Minutes per session: Not on file  . Stress: Not on file  Relationships  . Social connections:    Talks on phone: Not on file    Gets together: Not on file    Attends religious service: Not on file    Active member of club  or organization: Not on file    Attends meetings of clubs or organizations: Not on file    Relationship status: Not on file  Other Topics Concern  . Not on file  Social History Narrative  . Not on file   Outpatient Encounter Medications as of 07/13/2018  Medication Sig  . cetirizine (ZYRTEC) 10 MG tablet Take 10 mg by mouth daily.   Marland Kitchen escitalopram (LEXAPRO) 20 MG tablet TAKE 1 AND 1/2 TABLET DAILY  . levothyroxine (SYNTHROID, LEVOTHROID) 125 MCG tablet Take 1 tablet (125 mcg total) by mouth daily before breakfast.  . Multiple Vitamins-Minerals (CENTRUM SILVER PO) Take 1 capsule by mouth daily.  . nilotinib (TASIGNA) 150 MG capsule Take 2 capsules (300 mg total) by mouth every 12 (twelve)  hours.  . vitamin B-12 (CYANOCOBALAMIN) 1000 MCG tablet Take 1,000 mcg by mouth daily.   . [DISCONTINUED] Cholecalciferol (VITAMIN D-3) 5000 UNITS TABS Take 5,000 Units by mouth daily.    . [DISCONTINUED] levothyroxine (SYNTHROID, LEVOTHROID) 125 MCG tablet Take 1 tablet (125 mcg total) by mouth daily before breakfast.  . Cholecalciferol 2000 units CAPS Take 1 capsule (2,000 Units total) by mouth daily with breakfast.  . traMADol (ULTRAM) 50 MG tablet TAKE 1 TABLET (50 MG TOTAL) BY MOUTH EVERY 6 (SIX) HOURS AS NEEDED. (Patient not taking: Reported on 07/13/2018)   No facility-administered encounter medications on file as of 07/13/2018.    ALLERGIES: Allergies  Allergen Reactions  . Codeine Nausea Only    Headache  . Esomeprazole Magnesium Nausea Only  . Sulfonamide Derivatives Nausea Only    Headache  . Fluconazole Rash    Redness and blistering on left thigh    VACCINATION STATUS: Immunization History  Administered Date(s) Administered  . Influenza,inj,Quad PF,6+ Mos 09/13/2017  . Influenza-Unspecified 09/24/2015, 11/24/2017    HPI 72 year old female patient with medical history as above. She is being seen in follow-up for hypothyroidism . -She is currently on thyroxine 125 g by mouth every morning. She reports compliance to this medication.  - She had a steady weight over the last several visits.  She still complains of sweating although it has gotten better somewhat during the nights.  She is still on nilotinib for CML.   -She denies palpitations, tremors. -Her previous 24 hour urine studies were negative for pheochromocytoma.  -She has family history of thyroid problem involving goiter which required thyroidectomy in her mother who took thyroid hormone replacement subsequently. -She denies dysphagia, shortness of breath, voice change. -She is compliant to her medications. She is taking  Review of Systems  Constitutional: + Steady weight, - fatigue , + subjective  hyperthermia Eyes: no blurry vision, no xerophthalmia ENT: no sore throat, no nodules palpated in throat, no dysphagia/odynophagia, no hoarseness Cardiovascular: no Chest Pain, no Shortness of Breath, no palpitations, no leg swelling Respiratory: no cough, no SOB Gastrointestinal: no Nausea/Vomiting/Diarhhea Musculoskeletal: no muscle/joint aches Skin: no rashes Neurological: - tremors, no numbness, no tingling, no dizziness Psychiatric: no depression, no anxiety  Objective:    BP 136/70   Pulse 80   Ht 5\' 6"  (1.676 m)   Wt 217 lb 8 oz (98.7 kg)   SpO2 98%   BMI 35.11 kg/m   Wt Readings from Last 3 Encounters:  07/13/18 217 lb 8 oz (98.7 kg)  07/07/18 215 lb 12.8 oz (97.9 kg)  04/27/18 219 lb (99.3 kg)    Physical Exam  Constitutional:  Obese, not in acute distress, normal state of mind.  Eyes: PERRLA, EOMI,  no exophthalmos ENT: moist mucous membranes, no thyromegaly, no cervical lymphadenopathy Musculoskeletal: no gross deformities, strength intact in all four extremities Skin: moist, warm, no rashes Neurological: - tremor with outstretched hands   CMP     Component Value Date/Time   NA 140 07/03/2018 1127   K 4.7 07/03/2018 1127   CL 105 07/03/2018 1127   CO2 27 07/03/2018 1127   GLUCOSE 119 (H) 07/03/2018 1127   BUN 21 07/03/2018 1127   CREATININE 0.84 07/03/2018 1127   CREATININE 0.87 07/14/2011 0938   CALCIUM 9.0 07/03/2018 1127   PROT 7.4 07/03/2018 1127   ALBUMIN 3.6 07/03/2018 1127   AST 30 07/03/2018 1127   ALT 26 07/03/2018 1127   ALKPHOS 185 (H) 07/03/2018 1127   BILITOT 0.8 07/03/2018 1127   GFRNONAA >60 07/03/2018 1127   GFRAA >60 07/03/2018 1127   Diabetic Labs (most recent): Lab Results  Component Value Date   HGBA1C 5.3 10/28/2016   Results for BEVAN, VU "AMARISSA KOERNER" (MRN [id number]) as of 07/13/2018 17:20  Ref. Range 04/27/2018 14:42 07/03/2018 11:59  TSH Latest Ref Range: 0.350 - 4.500 uIU/mL 2.595 0.346 (L)  T4,Free(Direct) Latest Ref  Range: 0.82 - 1.77 ng/dL 1.06      Assessment & Plan:   1. Hypothyroidism - Her thyroid function tests are consistent with appropriate replacement.  - I advised her to continue on levothyroxine 125 g by mouth every morning.   - We discussed about correct intake of levothyroxine, at fasting, with water, separated by at least 30 minutes from breakfast, and separated by more than 4 hours from calcium, iron, multivitamins, acid reflux medications (PPIs). -Patient is made aware of the fact that thyroid hormone replacement is needed for life, dose to be adjusted by periodic monitoring of thyroid function tests.   - Her clinical exam has been  negative for goiter, hence no need for imaging of the thyroid at this time.  Her prior 24 hour  Urine studies were negative for pheochromocytoma.  Her subjective hypothermia and sweating is likely related to her therapy with nilotinib for CML.  - I advised patient to maintain close follow up with Sinda Du, MD for primary care needs. Follow up plan: Return in about 6 months (around 01/13/2019) for Follow up with Pre-visit Labs.  Glade Lloyd, MD Phone: [phone]  Fax: [phone]  -  This note was partially dictated with voice recognition software. Similar sounding words can be transcribed inadequately or may not  be corrected upon review.  07/13/2018, 5:16 PM

## 2018-07-18 ENCOUNTER — Telehealth (HOSPITAL_COMMUNITY): Payer: Self-pay

## 2018-07-18 NOTE — Telephone Encounter (Signed)
Per Dr. Walden Field request, called patient and left a message that her labs (BCR-ABL) were stable and to continue the same dose of Tasigna. Instructed her to call cancer center if any questions.

## 2018-07-27 DIAGNOSIS — C9220 Atypical chronic myeloid leukemia, BCR/ABL-negative, not having achieved remission: Secondary | ICD-10-CM | POA: Diagnosis not present

## 2018-07-27 DIAGNOSIS — M1990 Unspecified osteoarthritis, unspecified site: Secondary | ICD-10-CM | POA: Diagnosis not present

## 2018-07-27 DIAGNOSIS — K219 Gastro-esophageal reflux disease without esophagitis: Secondary | ICD-10-CM | POA: Diagnosis not present

## 2018-08-01 ENCOUNTER — Ambulatory Visit
Admission: RE | Admit: 2018-08-01 | Discharge: 2018-08-01 | Disposition: A | Discharge status: Home / Self Care | Payer: Medicare Other | Source: Ambulatory Visit | Attending: Thoracic Surgery (Cardiothoracic Vascular Surgery) | Admitting: Thoracic Surgery (Cardiothoracic Vascular Surgery)

## 2018-08-01 ENCOUNTER — Ambulatory Visit: Payer: Medicare Other | Admitting: Thoracic Surgery (Cardiothoracic Vascular Surgery)

## 2018-08-01 VITALS — BP 140/65 | HR 73 | Resp 20 | Ht 66.0 in | Wt 218.0 lb

## 2018-08-01 DIAGNOSIS — R918 Other nonspecific abnormal finding of lung field: Secondary | ICD-10-CM

## 2018-08-01 NOTE — Progress Notes (Signed)
[address]       [place] [address]             [phone]    HPI: Andrea Martinez returns for scheduled follow-up visit  Andrea Martinez is a 72 year old woman with a history of CML.  I did a left VATS to drain a left pleural effusion in November 2017.  On her CT of the chest she was noted to have an 8 mm right lower lobe lung nodule.  She has been followed with CT scans since then.  Nodules have remained stable.  She has been feeling well.  She has not had any respiratory issues.  She has been gaining some weight.  Past Medical History:  Diagnosis Date  . Chronic myelogenous leukemia (CML), BCR-ABL1-positive ([place]) 11/20/2015  . Gastric ulcer 2011   EGD, 5/11  . Hypothyroidism    not on meds, followed by Dr. Elyse Hsu  . Leukocytosis 11/20/2015  . Lipoma    left upper arm  . Pericardial effusion    a. HCAP complicated by pericardial effusion requiring pericardial window 10/2016 and large L pleural effusion requring VATS.  Marland Kitchen Pleural effusion    a. s/p VATS 2017.  Marland Kitchen PONV (postoperative nausea and vomiting)   . Prolonged QT interval   . UTI (lower urinary tract infection)    frequent    Current Outpatient Medications  Medication Sig Dispense Refill  . acetaminophen (TYLENOL) 500 MG tablet Take 500 mg by mouth every 6 (six) hours as needed.    . cetirizine (ZYRTEC) 10 MG tablet Take 10 mg by mouth daily.     . Cholecalciferol 2000 units CAPS Take 1 capsule (2,000 Units total) by mouth daily with breakfast. 30 each 6  . escitalopram (LEXAPRO) 20 MG tablet TAKE 1 AND 1/2 TABLET DAILY 60 tablet 5  . levothyroxine (SYNTHROID, LEVOTHROID) 125 MCG tablet Take 1 tablet (125 mcg total) by mouth daily before breakfast. 90 tablet 2  . Multiple Vitamins-Minerals (CENTRUM SILVER PO) Take 1 capsule by mouth daily.    . nilotinib (TASIGNA) 150 MG capsule Take 2 capsules (300 mg total) by mouth every 12 (twelve) hours. 120 capsule 1  . vitamin B-12 (CYANOCOBALAMIN) 1000 MCG  tablet Take 1,000 mcg by mouth daily.      No current facility-administered medications for this visit.     Physical Exam BP 140/65   Pulse 73   Resp 20   Ht 5\' 6"  (1.676 m)   Wt 218 lb (98.9 kg)   SpO2 97% Comment: RA  BMI 35.19 kg/m  Obese 72 year old woman in no acute distress Alert and oriented x3 with no focal deficits Lungs clear with equal breath sounds bilaterally Cardiac regular rate and rhythm normal S1-S2  Diagnostic Tests: CT CHEST WITHOUT CONTRAST  TECHNIQUE: Multidetector CT imaging of the chest was performed following the standard protocol without IV contrast.  COMPARISON:  08/02/2017 chest CT.  FINDINGS: Cardiovascular: Top-normal heart size, stable. No significant pericardial effusion/thickening. Atherosclerotic nonaneurysmal thoracic aorta. Normal caliber pulmonary arteries.  Mediastinum/Nodes: Stable bilateral subcentimeter hyperdense thyroid nodules. Unremarkable esophagus. No pathologically enlarged axillary, mediastinal or hilar lymph nodes, noting limited sensitivity for the detection of hilar adenopathy on this noncontrast study.  Lungs/Pleura: No pneumothorax. No pleural effusion. No acute consolidative airspace disease or lung masses. There are several solid pulmonary nodules in both lungs measuring up to 5 mm in the right lower lobe (series 8/image 117), all stable since at least 01/18/2017 chest CT  and considered benign. No new significant pulmonary nodules. Scattered small parenchymal bands in the mid to lower lungs, compatible with mild postinfectious/postinflammatory scarring.  Upper abdomen: Cholecystectomy. Stable granulomatous calcification in the right liver lobe.  Musculoskeletal: No aggressive appearing focal osseous lesions. Moderate thoracic spondylosis.  IMPRESSION: 1. Continued stability of scattered subcentimeter solid pulmonary nodules since at least 01/18/2017 chest CT, considered benign. 2. Mild  postinfectious/postinflammatory scarring in the mid to lower lungs.  Aortic Atherosclerosis (ICD10-I70.0).   Electronically Signed   By: Ilona Sorrel M.D.   On: 08/01/2018 10:53 I personally reviewed the Ct images and concur with the findings noted above  Impression: Andrea Martinez is a 72 year old woman with a history of CML who had a left VATS for a pleural effusion in 2017.  CT at that time showed some small lung nodules that we have followed since then.  Those nodules have remained stable and are unchanged again today.  We will plan to repeat a CT in 1 year for final follow-up  Plan: Return in 1 year with CT chest  [name], MD [facility] [phone]

## 2018-08-03 DIAGNOSIS — H6120 Impacted cerumen, unspecified ear: Secondary | ICD-10-CM | POA: Diagnosis not present

## 2018-08-08 ENCOUNTER — Other Ambulatory Visit (HOSPITAL_COMMUNITY): Payer: Self-pay | Admitting: *Deleted

## 2018-08-08 DIAGNOSIS — C9210 Chronic myeloid leukemia, BCR/ABL-positive, not having achieved remission: Secondary | ICD-10-CM

## 2018-08-08 MED ORDER — NILOTINIB HCL 150 MG PO CAPS: 300.0000 mg | ORAL_CAPSULE | Freq: Two times a day (BID) | ORAL | 1 refills | Status: DC

## 2018-08-08 NOTE — Telephone Encounter (Signed)
Chart reviewed, tasigna refilled.   

## 2018-08-21 ENCOUNTER — Other Ambulatory Visit (HOSPITAL_COMMUNITY): Payer: Self-pay | Admitting: *Deleted

## 2018-08-21 DIAGNOSIS — C9210 Chronic myeloid leukemia, BCR/ABL-positive, not having achieved remission: Secondary | ICD-10-CM

## 2018-08-21 MED ORDER — NILOTINIB HCL 150 MG PO CAPS: 300.0000 mg | ORAL_CAPSULE | Freq: Two times a day (BID) | ORAL | 3 refills | Status: DC

## 2018-08-22 ENCOUNTER — Telehealth (HOSPITAL_COMMUNITY): Payer: Self-pay | Admitting: Internal Medicine

## 2018-08-22 NOTE — Telephone Encounter (Signed)
Faxed tasigna script to rx

## 2018-09-11 DIAGNOSIS — J209 Acute bronchitis, unspecified: Secondary | ICD-10-CM | POA: Diagnosis not present

## 2018-09-11 DIAGNOSIS — C9220 Atypical chronic myeloid leukemia, BCR/ABL-negative, not having achieved remission: Secondary | ICD-10-CM | POA: Diagnosis not present

## 2018-09-11 DIAGNOSIS — R5383 Other fatigue: Secondary | ICD-10-CM | POA: Diagnosis not present

## 2018-09-20 ENCOUNTER — Ambulatory Visit (HOSPITAL_COMMUNITY)
Admission: RE | Admit: 2018-09-20 | Discharge: 2018-09-20 | Disposition: A | Discharge status: Home / Self Care | Payer: Medicare Other | Source: Ambulatory Visit | Attending: Pulmonary Disease | Admitting: Pulmonary Disease

## 2018-09-20 ENCOUNTER — Other Ambulatory Visit (HOSPITAL_COMMUNITY): Payer: Self-pay | Admitting: Pulmonary Disease

## 2018-09-20 ENCOUNTER — Other Ambulatory Visit (HOSPITAL_COMMUNITY)
Admission: RE | Admit: 2018-09-20 | Discharge: 2018-09-20 | Disposition: A | Discharge status: Home / Self Care | Payer: Medicare Other | Source: Ambulatory Visit | Attending: Pulmonary Disease | Admitting: Pulmonary Disease

## 2018-09-20 DIAGNOSIS — C9220 Atypical chronic myeloid leukemia, BCR/ABL-negative, not having achieved remission: Secondary | ICD-10-CM | POA: Diagnosis not present

## 2018-09-20 DIAGNOSIS — R109 Unspecified abdominal pain: Secondary | ICD-10-CM | POA: Diagnosis not present

## 2018-09-20 DIAGNOSIS — R911 Solitary pulmonary nodule: Secondary | ICD-10-CM | POA: Insufficient documentation

## 2018-09-20 DIAGNOSIS — R079 Chest pain, unspecified: Secondary | ICD-10-CM

## 2018-09-20 DIAGNOSIS — N816 Rectocele: Secondary | ICD-10-CM | POA: Diagnosis not present

## 2018-09-20 DIAGNOSIS — K429 Umbilical hernia without obstruction or gangrene: Secondary | ICD-10-CM | POA: Insufficient documentation

## 2018-09-20 LAB — COMPREHENSIVE METABOLIC PANEL
ALT: 16 U/L (ref 0–44)
AST: 21 U/L (ref 15–41)
Albumin: 4 g/dL (ref 3.5–5.0)
Alkaline Phosphatase: 141 U/L — ABNORMAL HIGH (ref 38–126)
Anion gap: 9 (ref 5–15)
BUN: 20 mg/dL (ref 8–23)
CHLORIDE: 105 mmol/L (ref 98–111)
CO2: 26 mmol/L (ref 22–32)
Calcium: 9.1 mg/dL (ref 8.9–10.3)
Creatinine, Ser: 0.82 mg/dL (ref 0.44–1.00)
Glucose, Bld: 113 mg/dL — ABNORMAL HIGH (ref 70–99)
POTASSIUM: 4.3 mmol/L (ref 3.5–5.1)
Sodium: 140 mmol/L (ref 135–145)
Total Bilirubin: 0.7 mg/dL (ref 0.3–1.2)
Total Protein: 7.6 g/dL (ref 6.5–8.1)

## 2018-09-20 LAB — CBC
HCT: 39.4 % (ref 36.0–46.0)
HEMOGLOBIN: 11.9 g/dL — AB (ref 12.0–15.0)
MCH: 27.5 pg (ref 26.0–34.0)
MCHC: 30.2 g/dL (ref 30.0–36.0)
MCV: 91 fL (ref 80.0–100.0)
PLATELETS: 359 10*3/uL (ref 150–400)
RBC: 4.33 MIL/uL (ref 3.87–5.11)
RDW: 12.7 % (ref 11.5–15.5)
WBC: 14.4 10*3/uL — ABNORMAL HIGH (ref 4.0–10.5)
nRBC: 0 % (ref 0.0–0.2)

## 2018-09-20 MED ORDER — IOPAMIDOL (ISOVUE-300) INJECTION 61%
100.0000 mL | Freq: Once | INTRAVENOUS | Status: AC | PRN
  Administered 2018-09-20: 100 mL via INTRAVENOUS

## 2018-10-24 ENCOUNTER — Other Ambulatory Visit (HOSPITAL_COMMUNITY): Payer: Self-pay | Admitting: *Deleted

## 2018-10-24 MED ORDER — ESCITALOPRAM OXALATE 20 MG PO TABS: ORAL_TABLET | ORAL | 5 refills | Status: DC

## 2018-10-31 ENCOUNTER — Inpatient Hospital Stay (HOSPITAL_COMMUNITY): Payer: Medicare Other | Attending: Hematology

## 2018-10-31 DIAGNOSIS — E039 Hypothyroidism, unspecified: Secondary | ICD-10-CM | POA: Diagnosis not present

## 2018-10-31 DIAGNOSIS — Z9221 Personal history of antineoplastic chemotherapy: Secondary | ICD-10-CM | POA: Diagnosis not present

## 2018-10-31 DIAGNOSIS — M25569 Pain in unspecified knee: Secondary | ICD-10-CM | POA: Insufficient documentation

## 2018-10-31 DIAGNOSIS — Z79899 Other long term (current) drug therapy: Secondary | ICD-10-CM | POA: Insufficient documentation

## 2018-10-31 DIAGNOSIS — R9431 Abnormal electrocardiogram [ECG] [EKG]: Secondary | ICD-10-CM | POA: Diagnosis not present

## 2018-10-31 DIAGNOSIS — I700 Atherosclerosis of aorta: Secondary | ICD-10-CM | POA: Diagnosis not present

## 2018-10-31 DIAGNOSIS — C9210 Chronic myeloid leukemia, BCR/ABL-positive, not having achieved remission: Secondary | ICD-10-CM | POA: Insufficient documentation

## 2018-10-31 LAB — CBC WITH DIFFERENTIAL/PLATELET
ABS IMMATURE GRANULOCYTES: 0.07 10*3/uL (ref 0.00–0.07)
BASOS ABS: 0 10*3/uL (ref 0.0–0.1)
Basophils Relative: 0 %
EOS PCT: 1 %
Eosinophils Absolute: 0.2 10*3/uL (ref 0.0–0.5)
HEMATOCRIT: 39.1 % (ref 36.0–46.0)
HEMOGLOBIN: 12 g/dL (ref 12.0–15.0)
Immature Granulocytes: 1 %
LYMPHS PCT: 12 %
Lymphs Abs: 1.5 10*3/uL (ref 0.7–4.0)
MCH: 28.6 pg (ref 26.0–34.0)
MCHC: 30.7 g/dL (ref 30.0–36.0)
MCV: 93.3 fL (ref 80.0–100.0)
Monocytes Absolute: 0.8 10*3/uL (ref 0.1–1.0)
Monocytes Relative: 7 %
NEUTROS PCT: 79 %
NRBC: 0 % (ref 0.0–0.2)
Neutro Abs: 9.4 10*3/uL — ABNORMAL HIGH (ref 1.7–7.7)
Platelets: 280 10*3/uL (ref 150–400)
RBC: 4.19 MIL/uL (ref 3.87–5.11)
RDW: 13.9 % (ref 11.5–15.5)
WBC: 12 10*3/uL — ABNORMAL HIGH (ref 4.0–10.5)

## 2018-10-31 LAB — COMPREHENSIVE METABOLIC PANEL
ALBUMIN: 3.9 g/dL (ref 3.5–5.0)
ALK PHOS: 167 U/L — AB (ref 38–126)
ALT: 25 U/L (ref 0–44)
AST: 37 U/L (ref 15–41)
Anion gap: 9 (ref 5–15)
BILIRUBIN TOTAL: 0.9 mg/dL (ref 0.3–1.2)
BUN: 21 mg/dL (ref 8–23)
CALCIUM: 9.3 mg/dL (ref 8.9–10.3)
CO2: 24 mmol/L (ref 22–32)
Chloride: 108 mmol/L (ref 98–111)
Creatinine, Ser: 0.92 mg/dL (ref 0.44–1.00)
GFR calc Af Amer: >60 mL/min (ref >60)
GFR calc non Af Amer: >60 mL/min (ref >60)
GLUCOSE: 130 mg/dL — AB (ref 70–99)
Potassium: 4.5 mmol/L (ref 3.5–5.1)
SODIUM: 141 mmol/L (ref 135–145)
TOTAL PROTEIN: 7.9 g/dL (ref 6.5–8.1)

## 2018-10-31 LAB — LACTATE DEHYDROGENASE: LDH: 156 U/L (ref 98–192)

## 2018-11-06 ENCOUNTER — Inpatient Hospital Stay (HOSPITAL_BASED_OUTPATIENT_CLINIC_OR_DEPARTMENT_OTHER): Payer: Medicare Other | Admitting: Internal Medicine

## 2018-11-06 ENCOUNTER — Other Ambulatory Visit: Payer: Self-pay

## 2018-11-06 ENCOUNTER — Encounter (HOSPITAL_COMMUNITY): Payer: Self-pay | Admitting: Internal Medicine

## 2018-11-06 VITALS — BP 140/54 | HR 75 | Temp 98.1°F | Resp 18 | Wt 228.1 lb

## 2018-11-06 DIAGNOSIS — Z79899 Other long term (current) drug therapy: Secondary | ICD-10-CM | POA: Diagnosis not present

## 2018-11-06 DIAGNOSIS — M25569 Pain in unspecified knee: Secondary | ICD-10-CM

## 2018-11-06 DIAGNOSIS — Z9221 Personal history of antineoplastic chemotherapy: Secondary | ICD-10-CM | POA: Diagnosis not present

## 2018-11-06 DIAGNOSIS — E039 Hypothyroidism, unspecified: Secondary | ICD-10-CM | POA: Diagnosis not present

## 2018-11-06 DIAGNOSIS — R9431 Abnormal electrocardiogram [ECG] [EKG]: Secondary | ICD-10-CM

## 2018-11-06 DIAGNOSIS — C9210 Chronic myeloid leukemia, BCR/ABL-positive, not having achieved remission: Secondary | ICD-10-CM

## 2018-11-06 DIAGNOSIS — I700 Atherosclerosis of aorta: Secondary | ICD-10-CM | POA: Diagnosis not present

## 2018-11-06 NOTE — Progress Notes (Signed)
Diagnosis Chronic myeloid leukemia, BCR/ABL-positive, not having achieved remission ([place]) - Plan: CBC with Differential/Platelet, Comprehensive metabolic panel, Lactate dehydrogenase, BCR-ABL1, CML/ALL, PCR, QUANT  Staging Cancer Staging No matching staging information was found for the patient.  Assessment and Plan:  1.  CML, BCR/ABL +.  72 y.o. female with CML, BCR-ABL+; started on Tasigna on 01/02/16. Treatment course complicated by left pleural effusion & pericardial effusion requiring pericardial window in 10/2016. Tasigna restarted on 12/06/16.   BCR-ABL labs done 12/2017 shows   continue to show improving b2a2 and b3a2 transcripts.  EKG 02/14/17 without QTc prolongation. With epeat EKG on 05/24/17 revealed prolonged QT/QTc 406/441. Tasigna was placed ON HOLD on 05/24/17 as a result.  Baseline EKG from 12/2015 QT/QTc was 372/429 at that time.  Resumed tasigna on 06/11/17.  Patient has been evaluated by cardiology who stated that it was safe for her to resume her tasigna.   EKG performed 04/07/2018 shows QT/QTc 382/438. Prior Prolonged QTc could be due to her underlying previously uncontrolled hypothyroidism  She also has evidence of low voltage and ? Anterior infarct.    Pt has been seen by cardiology with reported negative evaluation.  Follow-up with Cardiology as directed.   EKGs should be done by cardiology.    Continue Tasigna.  Labs done 10/31/2018 reviewed and showed WBC 12 HB 12 plts 280.000.  Chemistries WNL with K+ 4.5 Cr 0.92 and noraml LFTs.  Last BCR/ABL done 07/03/2018 was 0.01.  Previously BCR/ABL done 12/27/2017 was 0.0712.  Level pending today.  Pt will be notified of results  I have discussed with her to allow 2 weeks prior to follow-up so all results are available at visit.  Pt will RTC in 04/2019 for follow-up and labs  pending results.     2.  Joint pain and request for pain medications.  This occurred in the past.  Pt should follow-up with orthopedics and has option of pain  clinic or PCP for ongoing medication requests.   She reports she has been seen by orthopedics and receives periodic injections.  She reports she has been told that she may need knee replacement.    3.  Pleural effusion.  This occurred in 2017.  CXR done 03/2018 showed scarring and cardiomegaly.  She is advised to follow-up with PCP and CT surgery.    4.  Pulmonary nodules.  Pt had CT chest done 07/2017 that showed bilateral pulmonary nodules.  She is followed by Dr. Roxan Hockey.  Pt had CT chest done 08/01/2018 that was reviewed and showed  IMPRESSION: 1. Continued stability of scattered subcentimeter solid pulmonary nodules since at least 01/18/2017 chest CT, considered benign. 2. Mild postinfectious/postinflammatory scarring in the mid to lower lungs.  Aortic Atherosclerosis (ICD10-I70.0).  Pt has been seen by Dr. Roxan Hockey 08/01/2018 and is recommended for 1 year follow-up with his office and scans.  Pt should continue to follow-up with Dr. Roxan Hockey as directed.    5.  Hypothyroidism.  Pt on Synthroid.  Pt should continue to  follow-up with Dr. Luan Pulling or Dorris Fetch for management.   Interval History:   72 year old female previously followed by Dr. Talbert Cage.  Pt was seen by cardiology on 07/13/2017 for her prolonged QTc. Per Dr. Bronson Ing, patient's QT intervals are not prolonged to the point of potentially promoting a ventricular arrhythmia. He thinks she can continue on current medical therapy for her CML with serial ECG monitoring.   Current Status:  Pt is seen today for follow-up.  She reports she  is doing well today.  She is here to go over lab work.      CML (chronic myeloid leukemia) ([place])   11/21/2015 Initial Diagnosis    Chronic myelogenous leukemia (CML), BCR-ABL1-positive ([place])    11/21/2015 Miscellaneous    Se[facility] by Dr. Florene Glen [facility]    11/21/2015 -  Chemotherapy    Hydrea 1000 mg and Allopurinol 300 mg daily (due to high uric acid level)    11/21/2015 Tumor Marker    BCR P210  Ratio of Fusion to Control, Blood 0.8941    P210 IS % Ratio, Blood 85.834%      01/02/2016 - 10/23/2016 Chemotherapy    Tasigna    01/08/2016 Miscellaneous    EKG- QT/QTc: 382/440 ms    01/16/2016 Miscellaneous    EKG- QT/QTc: 364/419    03/30/2016 Tumor Marker    b2a2 transcript: 0.317 b3a2 transcript: 0.194    10/23/2016 - 11/05/2016 Hospital Admission    large Left Pleural Effusion and Pericardial Effusion that is now s/p VATS with Drainage, and Drainage of Pericardial Fluid with Window and Biopsy of Pericardium    12/06/2016 -  Chemotherapy    Tasigna restarted     05/24/2017 Adverse Reaction    Prolonged QTc on EKG; likely d/t chemo. Tasigna ON HOLD until next visit.     06/10/2017 -  Chemotherapy    Resumed Tasigna    07/13/2017 Miscellaneous    Evaluated by Cardiology for prolonged QTc. Per Dr. Bronson Ing, patient's QT intervals are not prolonged to the point of potentially promoting a ventricular arrhythmia. He thinks she can continue on current medical therapy for her CML with serial ECG monitoring.    12/27/2017 Miscellaneous    EKG: QT/QTc 398/435      Problem List Patient Active Problem List   Diagnosis Date Noted  . Sweating abnormality [L74.9] 10/11/2017  . Iron deficiency anemia [D50.9] 02/16/2017  . Pleural effusion [J90] 10/23/2016  . Solitary pulmonary nodule [R91.1] 10/23/2016  . Anemia [D64.9] 11/21/2015  . Bladder prolapse, female, acquired [N81.10] 11/21/2015  . IBS (irritable bowel syndrome) [K58.9] 11/21/2015  . Neutrophilic leukocytosis [Y50.3] 11/21/2015  . OA (osteoarthritis) of knee [M17.10] 11/21/2015  . CML (chronic myeloid leukemia) ([place]) [C92.10] 11/20/2015  . Biliary dyskinesia [K82.8] 12/04/2013  . Dyspepsia [R10.13] 11/30/2012  . Rib pain [R07.81] 11/01/2011  . Colon cancer screening [Z12.11] 09/21/2011  . Lipoma [214] 07/27/2011  . Lipoma of arm [D17.20] 07/27/2011  . Mass of arm [R22.30] 07/08/2011  . PUD [K27.9] 05/20/2010  .  ABDOMINAL PAIN RIGHT UPPER QUADRANT [R10.11] 02/12/2010  . Hypothyroidism [E03.9] 02/11/2010  . NAUSEA [R11.0] 02/11/2010  . Emiliano Dyer OF [T46.568] 02/11/2010    Past Medical History Past Medical History:  Diagnosis Date  . Chronic myelogenous leukemia (CML), BCR-ABL1-positive ([place]) 11/20/2015  . Gastric ulcer 2011   EGD, 5/11  . Hypothyroidism    not on meds, followed by Dr. Elyse Hsu  . Leukocytosis 11/20/2015  . Lipoma    left upper arm  . Pericardial effusion    a. HCAP complicated by pericardial effusion requiring pericardial window 10/2016 and large L pleural effusion requring VATS.  Marland Kitchen Pleural effusion    a. s/p VATS 2017.  Marland Kitchen PONV (postoperative nausea and vomiting)   . Prolonged QT interval   . UTI (lower urinary tract infection)    frequent    Past Surgical History Past Surgical History:  Procedure Laterality Date  . BRAVO [place] STUDY  12/04/2012   Procedure: BRAVO [place];  Surgeon: [name], MD;  [facility];  Service: Endoscopy;  Laterality: N/A;  . CHOLECYSTECTOMY N/A 01/03/2014   Procedure: LAPAROSCOPIC CHOLECYSTECTOMY WITH INTRAOPERATIVE CHOLANGIOGRAM;  Surgeon: Joyice Faster. Cornett, MD;  Location: [place];  Service: General;  Laterality: N/A;  . COLONOSCOPY  01/2004   [facility], few small tics  . ESOPHAGOGASTRODUODENOSCOPY  02/2010   gastric ulcers  . ESOPHAGOGASTRODUODENOSCOPY  12/04/2012   EAV:[id number] gastritis (inflammation) was found in the gastric antrum; multiple biopsies The duodenal mucosa showed no abnormalities in the bulb and second portion of the duodenum  . LIPOMA EXCISION  08/02/2011   left shoulder  . NOSE SURGERY    . PARTIAL HYSTERECTOMY     vaginal at age 62 years of age  . TOE DEBRIDEMENT  1962   lt great toe  . VIDEO ASSISTED THORACOSCOPY (VATS)/EMPYEMA Left 10/25/2016   Procedure: VIDEO ASSISTED THORACOSCOPY (VATS), BRONCH,DRAINAGE OF PLEURAL EFFUSION,PERICARDIAL WINDOW WITH DRAINAGE OF PERICARDIAL FLUID,  TEE;  Surgeon: [name], MD;  Location: [place];  Service: Thoracic;  Laterality: Left;  Marland Kitchen VIDEO BRONCHOSCOPY N/A 10/25/2016   Procedure: VIDEO BRONCHOSCOPY;  Surgeon: [name], MD;  Location: [facility];  Service: Thoracic;  Laterality: N/A;    Family History Family History  Problem Relation Age of Onset  . Lung cancer Father   . COPD Mother   . Colon cancer Neg Hx   . Anesthesia problems Neg Hx   . Hypotension Neg Hx   . Malignant hyperthermia Neg Hx   . Pseudochol deficiency Neg Hx      Social History  reports that she has never smoked. She has never used smokeless tobacco. She reports that she does not drink alcohol or use drugs.  Medications  Current Outpatient Medications:  .  acetaminophen (TYLENOL) 500 MG tablet, Take 500 mg by mouth every 6 (six) hours as needed., Disp: , Rfl:  .  cetirizine (ZYRTEC) 10 MG tablet, Take 10 mg by mouth daily. , Disp: , Rfl:  .  Cholecalciferol 2000 units CAPS, Take 1 capsule (2,000 Units total) by mouth daily with breakfast., Disp: 30 each, Rfl: 6 .  escitalopram (LEXAPRO) 20 MG tablet, TAKE 1 AND 1/2 TABLET DAILY, Disp: 60 tablet, Rfl: 5 .  levothyroxine (SYNTHROID, LEVOTHROID) 125 MCG tablet, Take 1 tablet (125 mcg total) by mouth daily before breakfast., Disp: 90 tablet, Rfl: 2 .  Multiple Vitamins-Minerals (CENTRUM SILVER PO), Take 1 capsule by mouth daily., Disp: , Rfl:  .  nilotinib (TASIGNA) 150 MG capsule, Take 2 capsules (300 mg total) by mouth every 12 (twelve) hours., Disp: 120 capsule, Rfl: 3 .  vitamin B-12 (CYANOCOBALAMIN) 1000 MCG tablet, Take 1,000 mcg by mouth daily. , Disp: , Rfl:  .  vitamin E 400 UNIT capsule, Take 400 Units by mouth daily., Disp: , Rfl:   Allergies Codeine; Esomeprazole magnesium; Sulfonamide derivatives; and Fluconazole  Review of Systems Review of Systems - Oncology  ROS negative   Physical Exam  Vitals Wt Readings from Last 3 Encounters:  11/06/18 228 lb 1.6 oz (103.5 kg)   08/01/18 218 lb (98.9 kg)  07/13/18 217 lb 8 oz (98.7 kg)   Temp Readings from Last 3 Encounters:  11/06/18 98.1 F (36.7 C) (Oral)  07/07/18 98.1 F (36.7 C) (Oral)  04/07/18 98.4 F (36.9 C) (Oral)   BP Readings from Last 3 Encounters:  11/06/18 (!) 140/54  08/01/18 140/65  07/13/18 136/70   Pulse Readings from Last 3 Encounters:  11/06/18 75  08/01/18 73  07/13/18 80   Constitutional: Well-developed, well-nourished, and in no distress.   HENT: Head: Normocephalic and atraumatic.  Mouth/Throat: No oropharyngeal exudate. Mucosa moist. Eyes: Pupils are equal, round, and reactive to light. Conjunctivae are normal. No scleral icterus.  Neck: Normal range of motion. Neck supple. No JVD present.  Cardiovascular: Normal rate, regular rhythm and normal heart sounds.  Exam reveals no gallop and no friction rub.   No murmur heard. Pulmonary/Chest: Effort normal and breath sounds normal. No respiratory distress. No wheezes.No rales.  Abdominal: Soft. Bowel sounds are normal. No distension. There is no tenderness. There is no guarding.  Musculoskeletal: No edema or tenderness.  Lymphadenopathy: No cervical, axillary  or supraclavicular adenopathy.  Neurological: Alert and oriented to person, place, and time. No cranial nerve deficit.  Skin: Skin is warm and dry. No rash noted. No erythema. No pallor.  Psychiatric: Affect and judgment normal.   Labs No visits with results within 3 Day(s) from this visit.  Latest known visit with results is:  Appointment on 10/31/2018  Component Date Value Ref Range Status  . WBC 10/31/2018 12.0* 4.0 - 10.5 K/uL Final  . RBC 10/31/2018 4.19  3.87 - 5.11 MIL/uL Final  . Hemoglobin 10/31/2018 12.0  12.0 - 15.0 g/dL Final  . HCT 10/31/2018 39.1  36.0 - 46.0 % Final  . MCV 10/31/2018 93.3  80.0 - 100.0 fL Final  . MCH 10/31/2018 28.6  26.0 - 34.0 pg Final  . MCHC 10/31/2018 30.7  30.0 - 36.0 g/dL Final  . RDW 10/31/2018 13.9  11.5 - 15.5 % Final  .  Platelets 10/31/2018 280  150 - 400 K/uL Final  . nRBC 10/31/2018 0.0  0.0 - 0.2 % Final  . Neutrophils Relative % 10/31/2018 79  % Final  . Neutro Abs 10/31/2018 9.4* 1.7 - 7.7 K/uL Final  . Lymphocytes Relative 10/31/2018 12  % Final  . Lymphs Abs 10/31/2018 1.5  0.7 - 4.0 K/uL Final  . Monocytes Relative 10/31/2018 7  % Final  . Monocytes Absolute 10/31/2018 0.8  0.1 - 1.0 K/uL Final  . Eosinophils Relative 10/31/2018 1  % Final  . Eosinophils Absolute 10/31/2018 0.2  0.0 - 0.5 K/uL Final  . Basophils Relative 10/31/2018 0  % Final  . Basophils Absolute 10/31/2018 0.0  0.0 - 0.1 K/uL Final  . Immature Granulocytes 10/31/2018 1  % Final  . Abs Immature Granulocytes 10/31/2018 0.07  0.00 - 0.07 K/uL Final   Performed at [facility], [address] [place], [place] [address]  . Sodium 10/31/2018 141  135 - 145 mmol/L Final  . Potassium 10/31/2018 4.5  3.5 - 5.1 mmol/L Final  . Chloride 10/31/2018 108  98 - 111 mmol/L Final  . CO2 10/31/2018 24  22 - 32 mmol/L Final  . Glucose, Bld 10/31/2018 130* 70 - 99 mg/dL Final  . BUN 10/31/2018 21  8 - 23 mg/dL Final  . Creatinine, Ser 10/31/2018 0.92  0.44 - 1.00 mg/dL Final  . Calcium 10/31/2018 9.3  8.9 - 10.3 mg/dL Final  . Total Protein 10/31/2018 7.9  6.5 - 8.1 g/dL Final  . Albumin 10/31/2018 3.9  3.5 - 5.0 g/dL Final  . AST 10/31/2018 37  15 - 41 U/L Final  . ALT 10/31/2018 25  0 - 44 U/L Final  . Alkaline Phosphatase 10/31/2018 167* 38 - 126 U/L Final  . Total Bilirubin 10/31/2018 0.9  0.3 - 1.2 mg/dL Final  .  GFR calc non Af Amer 10/31/2018 >60  >60 mL/min Final  . GFR calc Af Amer 10/31/2018 >60  >60 mL/min Final   Comment: (NOTE) The eGFR has been calculated using the CKD EPI equation. This calculation has not been validated in all clinical situations. eGFR's persistently <60 mL/min signify possible Chronic Kidney Disease.   Georgiann Hahn gap 10/31/2018 9  5 - 15 Final   Performed at [facility], [address], [place], [place]  [address]  . LDH 10/31/2018 156  98 - 192 U/L Final   Performed at [facility], [address] [place], [place] [address]     Pathology Orders Placed This Encounter  Procedures  . CBC with Differential/Platelet    Standing Status:   Future    Standing Expiration Date:   11/06/2020  . Comprehensive metabolic panel    Standing Status:   Future    Standing Expiration Date:   11/06/2020  . Lactate dehydrogenase    Standing Status:   Future    Standing Expiration Date:   11/06/2020  . BCR-ABL1, CML/ALL, PCR, QUANT    Standing Status:   Future    Standing Expiration Date:   11/06/2020       Zoila Shutter MD

## 2018-11-08 ENCOUNTER — Telehealth (HOSPITAL_COMMUNITY): Payer: Self-pay | Admitting: *Deleted

## 2018-11-08 LAB — BCR-ABL1, CML/ALL, PCR, QUANT

## 2018-11-08 NOTE — Telephone Encounter (Signed)
Pt aware of lab results. Pt verbalized understanding.

## 2018-11-14 ENCOUNTER — Other Ambulatory Visit (HOSPITAL_COMMUNITY): Payer: Self-pay | Admitting: Pulmonary Disease

## 2018-11-14 DIAGNOSIS — Z1231 Encounter for screening mammogram for malignant neoplasm of breast: Secondary | ICD-10-CM

## 2018-11-23 DIAGNOSIS — M1712 Unilateral primary osteoarthritis, left knee: Secondary | ICD-10-CM | POA: Diagnosis not present

## 2018-11-23 DIAGNOSIS — M170 Bilateral primary osteoarthritis of knee: Secondary | ICD-10-CM | POA: Diagnosis not present

## 2018-11-23 DIAGNOSIS — M1711 Unilateral primary osteoarthritis, right knee: Secondary | ICD-10-CM | POA: Diagnosis not present

## 2018-11-27 ENCOUNTER — Telehealth (HOSPITAL_COMMUNITY): Payer: Self-pay | Admitting: Hematology

## 2018-11-27 ENCOUNTER — Other Ambulatory Visit (HOSPITAL_COMMUNITY): Payer: Self-pay | Admitting: *Deleted

## 2018-11-27 DIAGNOSIS — C9210 Chronic myeloid leukemia, BCR/ABL-positive, not having achieved remission: Secondary | ICD-10-CM

## 2018-11-27 MED ORDER — NILOTINIB HCL 150 MG PO CAPS: 300.0000 mg | ORAL_CAPSULE | Freq: Two times a day (BID) | ORAL | 6 refills | Status: DC

## 2018-11-27 NOTE — Telephone Encounter (Signed)
Faxed application/RX to [employer] [facility] again per pt they did not receive it on last week.

## 2018-11-27 NOTE — Telephone Encounter (Signed)
Chart reviewed, Per Dr. Walden Field previous note, Tasigna refilled with refills until May of 2020 when patient returns to clinic.

## 2018-12-04 ENCOUNTER — Ambulatory Visit (HOSPITAL_COMMUNITY)
Admission: RE | Admit: 2018-12-04 | Discharge: 2018-12-04 | Disposition: A | Discharge status: Home / Self Care | Payer: Medicare Other | Source: Ambulatory Visit | Attending: Pulmonary Disease | Admitting: Pulmonary Disease

## 2018-12-04 DIAGNOSIS — Z1231 Encounter for screening mammogram for malignant neoplasm of breast: Secondary | ICD-10-CM | POA: Insufficient documentation

## 2018-12-04 LAB — HM MAMMOGRAPHY

## 2018-12-20 ENCOUNTER — Other Ambulatory Visit (HOSPITAL_COMMUNITY): Payer: Self-pay | Admitting: *Deleted

## 2018-12-20 ENCOUNTER — Telehealth (HOSPITAL_COMMUNITY): Payer: Self-pay | Admitting: Internal Medicine

## 2018-12-20 NOTE — Telephone Encounter (Signed)
SUBMITTED PA FOR TASIGNA TO [facility]

## 2018-12-24 ENCOUNTER — Emergency Department (HOSPITAL_COMMUNITY): Payer: Medicare Other

## 2018-12-24 ENCOUNTER — Encounter (HOSPITAL_COMMUNITY): Payer: Self-pay

## 2018-12-24 ENCOUNTER — Emergency Department (HOSPITAL_COMMUNITY)
Admission: EM | Admit: 2018-12-24 | Discharge: 2018-12-24 | Disposition: A | Discharge status: Home / Self Care | Payer: Medicare Other | Attending: Emergency Medicine | Admitting: Emergency Medicine

## 2018-12-24 ENCOUNTER — Other Ambulatory Visit: Payer: Self-pay

## 2018-12-24 DIAGNOSIS — R109 Unspecified abdominal pain: Secondary | ICD-10-CM | POA: Diagnosis not present

## 2018-12-24 DIAGNOSIS — Z9049 Acquired absence of other specified parts of digestive tract: Secondary | ICD-10-CM | POA: Diagnosis not present

## 2018-12-24 DIAGNOSIS — N390 Urinary tract infection, site not specified: Secondary | ICD-10-CM | POA: Insufficient documentation

## 2018-12-24 DIAGNOSIS — Z79899 Other long term (current) drug therapy: Secondary | ICD-10-CM | POA: Diagnosis not present

## 2018-12-24 DIAGNOSIS — R05 Cough: Secondary | ICD-10-CM | POA: Diagnosis not present

## 2018-12-24 DIAGNOSIS — Z856 Personal history of leukemia: Secondary | ICD-10-CM | POA: Diagnosis not present

## 2018-12-24 DIAGNOSIS — E039 Hypothyroidism, unspecified: Secondary | ICD-10-CM | POA: Diagnosis not present

## 2018-12-24 DIAGNOSIS — K5900 Constipation, unspecified: Secondary | ICD-10-CM | POA: Diagnosis not present

## 2018-12-24 LAB — URINALYSIS, ROUTINE W REFLEX MICROSCOPIC
Bilirubin Urine: NEGATIVE
GLUCOSE, UA: NEGATIVE mg/dL
Hgb urine dipstick: NEGATIVE
KETONES UR: NEGATIVE mg/dL
Nitrite: NEGATIVE
PH: 6 (ref 5.0–8.0)
PROTEIN: NEGATIVE mg/dL
Specific Gravity, Urine: >1.046 — ABNORMAL HIGH (ref 1.005–1.030)

## 2018-12-24 LAB — COMPREHENSIVE METABOLIC PANEL
ALBUMIN: 3.6 g/dL (ref 3.5–5.0)
ALT: 24 U/L (ref 0–44)
ANION GAP: 10 (ref 5–15)
AST: 27 U/L (ref 15–41)
Alkaline Phosphatase: 136 U/L — ABNORMAL HIGH (ref 38–126)
BILIRUBIN TOTAL: 0.7 mg/dL (ref 0.3–1.2)
BUN: 28 mg/dL — ABNORMAL HIGH (ref 8–23)
CHLORIDE: 105 mmol/L (ref 98–111)
CO2: 23 mmol/L (ref 22–32)
Calcium: 9 mg/dL (ref 8.9–10.3)
Creatinine, Ser: 1.01 mg/dL — ABNORMAL HIGH (ref 0.44–1.00)
GFR calc Af Amer: >60 mL/min (ref >60)
GFR, EST NON AFRICAN AMERICAN: 55 mL/min — AB (ref >60)
Glucose, Bld: 101 mg/dL — ABNORMAL HIGH (ref 70–99)
POTASSIUM: 4.1 mmol/L (ref 3.5–5.1)
Sodium: 138 mmol/L (ref 135–145)
TOTAL PROTEIN: 7.8 g/dL (ref 6.5–8.1)

## 2018-12-24 LAB — LIPASE, BLOOD: LIPASE: 34 U/L (ref 11–51)

## 2018-12-24 LAB — CBC WITH DIFFERENTIAL/PLATELET
Abs Immature Granulocytes: 0.1 10*3/uL — ABNORMAL HIGH (ref 0.00–0.07)
Basophils Absolute: 0 10*3/uL (ref 0.0–0.1)
Basophils Relative: 0 %
Eosinophils Absolute: 0.1 10*3/uL (ref 0.0–0.5)
Eosinophils Relative: 1 %
HCT: 37.5 % (ref 36.0–46.0)
Hemoglobin: 11.6 g/dL — ABNORMAL LOW (ref 12.0–15.0)
Immature Granulocytes: 1 %
Lymphocytes Relative: 15 %
Lymphs Abs: 1.8 10*3/uL (ref 0.7–4.0)
MCH: 28.4 pg (ref 26.0–34.0)
MCHC: 30.9 g/dL (ref 30.0–36.0)
MCV: 91.7 fL (ref 80.0–100.0)
Monocytes Absolute: 0.8 10*3/uL (ref 0.1–1.0)
Monocytes Relative: 6 %
Neutro Abs: 9.4 10*3/uL — ABNORMAL HIGH (ref 1.7–7.7)
Neutrophils Relative %: 77 %
Platelets: 338 10*3/uL (ref 150–400)
RBC: 4.09 MIL/uL (ref 3.87–5.11)
RDW: 13.3 % (ref 11.5–15.5)
WBC: 12.1 10*3/uL — ABNORMAL HIGH (ref 4.0–10.5)
nRBC: 0 % (ref 0.0–0.2)

## 2018-12-24 LAB — TROPONIN I: Troponin I: <0.03 ng/mL

## 2018-12-24 MED ORDER — ONDANSETRON HCL 4 MG/2ML IJ SOLN
4.0000 mg | INTRAMUSCULAR | Status: AC | PRN
  Administered 2018-12-24 (×2): 4 mg via INTRAVENOUS
  Filled 2018-12-24 (×2): qty 2

## 2018-12-24 MED ORDER — IOPAMIDOL (ISOVUE-300) INJECTION 61%
100.0000 mL | Freq: Once | INTRAVENOUS | Status: AC | PRN
  Administered 2018-12-24: 100 mL via INTRAVENOUS

## 2018-12-24 MED ORDER — PROMETHAZINE HCL 25 MG/ML IJ SOLN
6.2500 mg | INTRAMUSCULAR | Status: DC | PRN
  Administered 2018-12-24: 6.25 mg via INTRAVENOUS
  Filled 2018-12-24: qty 1

## 2018-12-24 MED ORDER — CEPHALEXIN 500 MG PO CAPS: 500.0000 mg | ORAL_CAPSULE | Freq: Four times a day (QID) | ORAL | 0 refills | Status: DC

## 2018-12-24 MED ORDER — MORPHINE SULFATE (PF) 4 MG/ML IV SOLN
4.0000 mg | INTRAVENOUS | Status: AC | PRN
  Administered 2018-12-24 (×2): 4 mg via INTRAVENOUS
  Filled 2018-12-24 (×2): qty 1

## 2018-12-24 MED ORDER — SODIUM CHLORIDE 0.9 % IV BOLUS
250.0000 mL | Freq: Once | INTRAVENOUS | Status: AC
  Administered 2018-12-24: 250 mL via INTRAVENOUS

## 2018-12-24 MED ORDER — PROMETHAZINE HCL 12.5 MG PO TABS: 25.0000 mg | ORAL_TABLET | Freq: Three times a day (TID) | ORAL | 0 refills | Status: DC | PRN

## 2018-12-24 NOTE — Discharge Instructions (Addendum)
Take the prescriptions as directed.  Increase your fluid intake (ie:  Gatoraide) for the next few days.  Eat a bland diet and advance to your regular diet slowly as you can tolerate it. Increase the fiber in your diet. Call your regular medical doctor Monday to schedule a follow up appointment in the next 2 days.  Return to the Emergency Department immediately sooner if worsening.

## 2018-12-24 NOTE — ED Notes (Signed)
Pt ambulated to restroom. 

## 2018-12-24 NOTE — ED Provider Notes (Signed)
[facility] EMERGENCY DEPARTMENT Provider Note   CSN: [id number] Arrival date & time: 12/24/18  1808     History   Chief Complaint Chief Complaint  Patient presents with  . Back Pain  . Constipation  . Cough    HPI Andrea Martinez is a 73 y.o. female.  HPI  Pt was seen at [place].  Per pt, c/o gradual onset and persistence of constant right sided abd "pain" for the past 4 days. Describes the pain as "bloating," with radiation into her lower back. Has been associated with constipation for the past 3 days. Pt states she took OTC miralax with only very small BM. Pt also states she has had a cough for the past 2 to 3 weeks, has been evaluated by her PMD, took a course of antibiotics without improvement. Denies N/V, no diarrhea, no fevers, no back pain, no rash, no CP/SOB, no black or blood in stools.        Past Medical History:  Diagnosis Date  . 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 Gastric ulcer 2011   EGD, 5/11  . Hypothyroidism    not on meds, followed by Dr. Elyse Hsu  . Leukocytosis 11/20/2015  . Lipoma    left upper arm  . Pericardial effusion    a. HCAP complicated by pericardial effusion requiring pericardial window 10/2016 and large L pleural effusion requring VATS.  Marland Kitchen Pleural effusion    a. s/p VATS 2017.  Marland Kitchen PONV (postoperative nausea and vomiting)   . Prolonged QT interval   . UTI (lower urinary tract infection)    frequent    Patient Active Problem List   Diagnosis Date Noted  . Sweating abnormality 10/11/2017  . Iron deficiency anemia 02/16/2017  . Pleural effusion 10/23/2016  . Solitary pulmonary nodule 10/23/2016  . Anemia 11/21/2015  . Bladder prolapse, female, acquired 11/21/2015  . IBS (irritable bowel syndrome) 11/21/2015  . Neutrophilic leukocytosis 66/05/3015  . OA (osteoarthritis) of knee 11/21/2015  . CML (chronic myeloid leukemia) ([place]) 11/20/2015  . Biliary dyskinesia 12/04/2013  . Dyspepsia 11/30/2012  .  Rib pain 11/01/2011  . Colon cancer screening 09/21/2011  . Lipoma 07/27/2011  . Lipoma of arm 07/27/2011  . Mass of arm 07/08/2011  . PUD 05/20/2010  . ABDOMINAL PAIN RIGHT UPPER QUADRANT 02/12/2010  . Hypothyroidism 02/11/2010  . NAUSEA 02/11/2010  . UTI'S, HX OF 02/11/2010    Past Surgical History:  Procedure Laterality Date  . BRAVO [place] STUDY  12/04/2012   Procedure: BRAVO [place];  Surgeon: [name], MD;  [facility];  Service: Endoscopy;  Laterality: N/A;  . CHOLECYSTECTOMY N/A 01/03/2014   Procedure: LAPAROSCOPIC CHOLECYSTECTOMY WITH INTRAOPERATIVE CHOLANGIOGRAM;  Surgeon: Joyice Faster. Cornett, MD;  Location: [place];  Service: General;  Laterality: N/A;  . COLONOSCOPY  01/2004   [facility], few small tics  . ESOPHAGOGASTRODUODENOSCOPY  02/2010   gastric ulcers  . ESOPHAGOGASTRODUODENOSCOPY  12/04/2012   [id number] gastritis (inflammation) was found in the gastric antrum; multiple biopsies The duodenal mucosa showed no abnormalities in the bulb and second portion of the duodenum  . LIPOMA EXCISION  08/02/2011   left shoulder  . NOSE SURGERY    . PARTIAL HYSTERECTOMY     vaginal at age 1 years of age  . TOE DEBRIDEMENT  1962   lt great toe  . VIDEO ASSISTED THORACOSCOPY (VATS)/EMPYEMA Left 10/25/2016   Procedure: VIDEO ASSISTED THORACOSCOPY (VATS), BRONCH,DRAINAGE OF PLEURAL EFFUSION,PERICARDIAL WINDOW WITH  DRAINAGE OF PERICARDIAL FLUID, TEE;  Surgeon: [name], MD;  Location: [place];  Service: Thoracic;  Laterality: Left;  Marland Kitchen VIDEO BRONCHOSCOPY N/A 10/25/2016   Procedure: VIDEO BRONCHOSCOPY;  Surgeon: [name], MD;  Location: [place];  Service: Thoracic;  Laterality: N/A;     OB History   No obstetric history on file.      Home Medications    Prior to Admission medications   Medication Sig Start Date End Date Taking? Authorizing Provider  acetaminophen (TYLENOL) 500 MG tablet Take 500 mg by mouth every 6 (six)  hours as needed.   Yes [provider]  benzonatate (TESSALON) 200 MG capsule Take 200 mg by mouth 3 (three) times daily as needed for cough.   Yes [provider]  cephALEXin (KEFLEX) 500 MG capsule Take 500 mg by mouth 3 (three) times daily.   Yes [provider]  cetirizine (ZYRTEC) 10 MG tablet Take 10 mg by mouth daily.    Yes [provider]  Cholecalciferol 2000 units CAPS Take 1 capsule (2,000 Units total) by mouth daily with breakfast. 07/13/18  Yes Nida, Marella Chimes, MD  dextromethorphan-guaiFENesin (MUCINEX DM) 30-600 MG 12hr tablet Take 1 tablet by mouth 2 (two) times daily.   Yes [provider]  escitalopram (LEXAPRO) 20 MG tablet TAKE 1 AND 1/2 TABLET DAILY 10/24/18  Yes Derek Jack, MD  levothyroxine (SYNTHROID, LEVOTHROID) 125 MCG tablet Take 1 tablet (125 mcg total) by mouth daily before breakfast. 07/13/18  Yes Nida, Marella Chimes, MD  Multiple Vitamins-Minerals (CENTRUM SILVER PO) Take 1 capsule by mouth daily.   Yes [provider]  nilotinib (TASIGNA) 150 MG capsule Take 2 capsules (300 mg total) by mouth every 12 (twelve) hours. 11/27/18  Yes Higgs, Mathis Dad, MD  vitamin B-12 (CYANOCOBALAMIN) 1000 MCG tablet Take 1,000 mcg by mouth daily.    Yes [provider]  vitamin E 400 UNIT capsule Take 400 Units by mouth daily.   Yes [provider]    Family History Family History  Problem Relation Age of Onset  . Lung cancer Father   . COPD Mother   . Colon cancer Neg Hx   . Anesthesia problems Neg Hx   . Hypotension Neg Hx   . Malignant hyperthermia Neg Hx   . Pseudochol deficiency Neg Hx     Social History Social History   Tobacco Use  . Smoking status: Never Smoker  . Smokeless tobacco: Never Used  Substance Use Topics  . Alcohol use: No    Alcohol/week: 0.0 standard drinks  . Drug use: No     Allergies   Codeine; Esomeprazole magnesium; Sulfonamide derivatives; and  Fluconazole   Review of Systems Review of Systems ROS: Statement: All systems negative except as marked or noted in the HPI; Constitutional: Negative for fever and chills. ; ; Eyes: Negative for eye pain, redness and discharge. ; ; ENMT: Negative for ear pain, hoarseness, nasal congestion, sinus pressure and sore throat. ; ; Cardiovascular: Negative for chest pain, palpitations, diaphoresis, dyspnea and peripheral edema. ; ; Respiratory: +cough. Negative for wheezing and stridor. ; ; Gastrointestinal: +constipation, abd pain. Negative for nausea, vomiting, diarrhea, blood in stool, hematemesis, jaundice and rectal bleeding. . ; ; Genitourinary: Negative for dysuria, flank pain and hematuria. ; ; Musculoskeletal: Negative for back pain and neck pain. Negative for swelling and trauma.; ; Skin: Negative for pruritus, rash, abrasions, blisters, bruising and skin lesion.; ; Neuro: Negative for headache, lightheadedness and neck  stiffness. Negative for weakness, altered level of consciousness, altered mental status, extremity weakness, paresthesias, involuntary movement, seizure and syncope.       Physical Exam Updated Vital Signs BP 128/64   Pulse 60   Temp 98.3 F (36.8 C) (Tympanic)   Resp 15   Ht 5\' 6"  (1.676 m)   Wt 103.5 kg   SpO2 97%   BMI 36.83 kg/m   BP 118/63 (BP Location: Right Arm)   Pulse 72   Temp 98.3 F (36.8 C) (Tympanic)   Resp 15   Ht 5\' 6"  (1.676 m)   Wt 103.5 kg   SpO2 92%   BMI 36.83 kg/m    Physical Exam 1900: Physical examination:  Nursing notes reviewed; Vital signs and O2 SAT reviewed;  Constitutional: Well developed, Well nourished, Well hydrated, Uncomfortable appearing; Head:  Normocephalic, atraumatic; Eyes: EOMI, PERRL, No scleral icterus; ENMT: Mouth and pharynx normal, Mucous membranes moist; Neck: Supple, Full range of motion, No lymphadenopathy; Cardiovascular: Regular rate and rhythm, No gallop; Respiratory: Breath sounds clear & equal bilaterally,  No wheezes.  Speaking full sentences with ease, Normal respiratory effort/excursion; Chest: Nontender, Movement normal; Abdomen: Soft, +diffuse tenderness to palp. Nondistended, Normal bowel sounds; Genitourinary: No CVA tenderness; Spine:  No midline CS, TS, LS tenderness. +TTP right lumbar paraspinal muscles.;;;; Extremities: Peripheral pulses normal, No tenderness, +3 chronic bilat LE's edema per pt. No calf asymmetry.; Neuro: AA&Ox3, Major CN grossly intact.  Speech clear. No gross focal motor or sensory deficits in extremities.; Skin: Color normal, Warm, Dry.   ED Treatments / Results  Labs (all labs ordered are listed, but only abnormal results are displayed)   EKG EKG Interpretation  Date/Time:  Sunday December 24 2018 19:19:23 EST Ventricular Rate:  66 PR Interval:    QRS Duration: 91 QT Interval:  396 QTC Calculation: 415 R Axis:   38 Text Interpretation:  Sinus rhythm When compared with ECG of 04/07/2018 No significant change was found Confirmed by Francine Graven [phone]) on 12/24/2018 7:51:43 PM   Radiology   Procedures Procedures (including critical care time)  Medications Ordered in ED Medications  promethazine (PHENERGAN) injection 6.25 mg (6.25 mg Intravenous Given 12/24/18 2201)  morphine 4 MG/ML injection 4 mg (4 mg Intravenous Given 12/24/18 2018)  ondansetron (ZOFRAN) injection 4 mg (4 mg Intravenous Given 12/24/18 2018)  iopamidol (ISOVUE-300) 61 % injection 100 mL (100 mLs Intravenous Contrast Given 12/24/18 2120)  sodium chloride 0.9 % bolus 250 mL (250 mLs Intravenous New Bag/Given 12/24/18 2200)     Initial Impression / Assessment and Plan / ED Course  I have reviewed the triage vital signs and the nursing notes.  Pertinent labs & imaging results that were available during my care of the patient were reviewed by me and considered in my medical decision making (see chart for details).  MDM Reviewed: previous chart, nursing note and vitals Reviewed  previous: labs and ECG Interpretation: labs, ECG, x-ray and CT scan   Results for orders placed or performed during the hospital encounter of 12/24/18  Urinalysis, Routine w reflex microscopic  Result Value Ref Range   Color, Urine YELLOW YELLOW   APPearance CLEAR CLEAR   Specific Gravity, Urine >1.046 (H) 1.005 - 1.030   pH 6.0 5.0 - 8.0   Glucose, UA NEGATIVE NEGATIVE mg/dL   Hgb urine dipstick NEGATIVE NEGATIVE   Bilirubin Urine NEGATIVE NEGATIVE   Ketones, ur NEGATIVE NEGATIVE mg/dL   Protein, ur NEGATIVE NEGATIVE mg/dL   Nitrite NEGATIVE NEGATIVE  Leukocytes, UA LARGE (A) NEGATIVE   RBC / HPF 6-10 0 - 5 RBC/hpf   WBC, UA 21-50 0 - 5 WBC/hpf   Bacteria, UA RARE (A) NONE SEEN   Squamous Epithelial / LPF 6-10 0 - 5   Mucus PRESENT   Comprehensive metabolic panel  Result Value Ref Range   Sodium 138 135 - 145 mmol/L   Potassium 4.1 3.5 - 5.1 mmol/L   Chloride 105 98 - 111 mmol/L   CO2 23 22 - 32 mmol/L   Glucose, Bld 101 (H) 70 - 99 mg/dL   BUN 28 (H) 8 - 23 mg/dL   Creatinine, Ser 1.01 (H) 0.44 - 1.00 mg/dL   Calcium 9.0 8.9 - 10.3 mg/dL   Total Protein 7.8 6.5 - 8.1 g/dL   Albumin 3.6 3.5 - 5.0 g/dL   AST 27 15 - 41 U/L   ALT 24 0 - 44 U/L   Alkaline Phosphatase 136 (H) 38 - 126 U/L   Total Bilirubin 0.7 0.3 - 1.2 mg/dL   GFR calc non Af Amer 55 (L) >60 mL/min   GFR calc Af Amer >60 >60 mL/min   Anion gap 10 5 - 15  Lipase, blood  Result Value Ref Range   Lipase 34 11 - 51 U/L  Troponin I - Once  Result Value Ref Range   Troponin I <0.03 <0.03 ng/mL  CBC with Differential  Result Value Ref Range   WBC 12.1 (H) 4.0 - 10.5 K/uL   RBC 4.09 3.87 - 5.11 MIL/uL   Hemoglobin 11.6 (L) 12.0 - 15.0 g/dL   HCT 37.5 36.0 - 46.0 %   MCV 91.7 80.0 - 100.0 fL   MCH 28.4 26.0 - 34.0 pg   MCHC 30.9 30.0 - 36.0 g/dL   RDW 13.3 11.5 - 15.5 %   Platelets 338 150 - 400 K/uL   nRBC 0.0 0.0 - 0.2 %   Neutrophils Relative % 77 %   Neutro Abs 9.4 (H) 1.7 - 7.7 K/uL    Lymphocytes Relative 15 %   Lymphs Abs 1.8 0.7 - 4.0 K/uL   Monocytes Relative 6 %   Monocytes Absolute 0.8 0.1 - 1.0 K/uL   Eosinophils Relative 1 %   Eosinophils Absolute 0.1 0.0 - 0.5 K/uL   Basophils Relative 0 %   Basophils Absolute 0.0 0.0 - 0.1 K/uL   Immature Granulocytes 1 %   Abs Immature Granulocytes 0.10 (H) 0.00 - 0.07 K/uL   Dg Chest 2 View Result Date: 12/24/2018 CLINICAL DATA:  Low back pain radiating to the right side for 4 days. Constipation for 2 days. Cough. History of leukemia. EXAM: CHEST - 2 VIEW COMPARISON:  09/20/2018 FINDINGS: Cardiac enlargement. No vascular congestion, edema, or consolidation. Linear fibrosis in the lung bases, unchanged. No blunting of costophrenic angles. No pneumothorax. Mediastinal contours appear intact. Degenerative changes in the spine. IMPRESSION: Cardiac enlargement. No evidence of active pulmonary disease. Electronically Signed   By: Lucienne Capers M.D.   On: 12/24/2018 22:08   Ct Abdomen Pelvis W Contrast Result Date: 12/24/2018 CLINICAL DATA:  Lower umbilical abdominal pain. Distention and constipation for 3 days. Nausea. EXAM: CT ABDOMEN AND PELVIS WITH CONTRAST TECHNIQUE: Multidetector CT imaging of the abdomen and pelvis was performed using the standard protocol following bolus administration of intravenous contrast. CONTRAST:  137mL ISOVUE-300 IOPAMIDOL (ISOVUE-300) INJECTION 61% COMPARISON:  09/20/2018 FINDINGS: Lower chest: Linear atelectasis or fibrosis in the lung bases. Right costophrenic angle nodule measuring 6 mm diameter,  unchanged since prior study. Hepatobiliary: No focal liver abnormality is seen. Status post cholecystectomy. No biliary dilatation. Pancreas: Unremarkable. No pancreatic ductal dilatation or surrounding inflammatory changes. Spleen: Normal in size without focal abnormality. Adrenals/Urinary Tract: Adrenal glands are unremarkable. Kidneys are normal, without renal calculi, focal lesion, or hydronephrosis. Bladder  is unremarkable. Stomach/Bowel: Stomach is within normal limits. Appendix appears normal. No evidence of bowel wall thickening, distention, or inflammatory changes. Vascular/Lymphatic: No significant vascular findings are present. No enlarged abdominal or pelvic lymph nodes. Reproductive: Status post hysterectomy. No adnexal masses. Other: No abdominal wall hernia or abnormality. No abdominopelvic ascites. Musculoskeletal: Degenerative changes in the spine. Schmorl's node at T12. Mild superior endplate compression at L1. L1 compression is increased since previous study. IMPRESSION: No acute process demonstrated in the abdomen or pelvis. No evidence of bowel obstruction or inflammation. Mild superior endplate compression at L1, increased since previous study. Electronically Signed   By: Lucienne Capers M.D.   On: 12/24/2018 22:22    2320:  Judicious IVF given. Pt has tol PO well while in the ED without N/V.  No stooling while in the ED.  Pt has ambulated with steady gait, easy resps, NAD. Abd benign, VSS, no fevers. Appears less anxious. Feels better after phenergan IV and wants to go home now.  Udip appears contaminated, but will tx for possible UTI while UC pending. Return precautions given. Dx and testing d/w pt and family.  Questions answered.  Verb understanding, agreeable to d/c home with outpt f/u.      Final Clinical Impressions(s) / ED Diagnoses   Final diagnoses:  None    ED Discharge Orders    None       Francine Graven, DO 12/28/18 1329

## 2018-12-24 NOTE — ED Triage Notes (Addendum)
Pt reports lower back pain that radiates to right side for 4 days. Pt also reports constipated for 2 days and tried miralax and only had small result .Pt also reports a hacking cough and sweating. Pt states she is a cancer pt and seen at cancer center

## 2018-12-24 NOTE — ED Notes (Signed)
ED Provider at bedside. 

## 2018-12-27 ENCOUNTER — Telehealth (HOSPITAL_COMMUNITY): Payer: Self-pay | Admitting: Hematology

## 2018-12-27 NOTE — Telephone Encounter (Signed)
PC TO [employer] WITH [facility]. ADVISED HER THAT A PA WAS APPROVED FOR THE PT FOR 2020. SHE STATED PT WILL CONTINUE TO GET MEDS WHILE APPLICATION IS BEING PROCESSED. WILL FAX OVER COPY OF PA TO [employer]

## 2018-12-28 LAB — URINE CULTURE: Culture: 30000 — AB

## 2018-12-29 ENCOUNTER — Telehealth: Payer: Self-pay | Admitting: *Deleted

## 2018-12-29 NOTE — Telephone Encounter (Signed)
Post ED Visit - Positive Culture Follow-up: Unsuccessful Patient Follow-up  Culture assessed and recommendations reviewed by:  []  Elenor Quinones, Pharm.D. []  Heide Guile, Pharm.D., BCPS AQ-ID []  Parks Neptune, Pharm.D., BCPS []  Alycia Rossetti, Pharm.D., BCPS []  [place], Pharm.D., BCPS, AAHIVP []  Legrand Como, Pharm.D., BCPS, AAHIVP []  Wynell Balloon, PharmD []  Vincenza Hews, PharmD, BCPS  Positive urine culture, reviewed by Domenic Moras, PharmD  []  Patient discharged without antimicrobial prescription and treatment is now indicated []  Organism is resistant to prescribed ED discharge antimicrobial []  Patient with positive blood cultures  Plan:  Stop Cephalexin, no further treatment Unable to contact patient after 3 attempts, letter will be sent to address on file  Ardeen Fillers 12/29/2018, 9:28 AM

## 2019-01-03 ENCOUNTER — Telehealth (HOSPITAL_COMMUNITY): Payer: Self-pay | Admitting: Hematology

## 2019-01-03 NOTE — Telephone Encounter (Signed)
Pc from pt stating she recvd a letter from [employer] request a renew app. And a PA.  I called [employer] AGAIN and spk with April who assured me the app was received otherwise they would not have requested A PA for the pt. I faxed the PA over on 12/27/2018 and it has not been reviewed as of yet. Per April pt will continue to get her meds until a decision is made about her renewal application.  Called the pt and left detailed vm informing her AGAIN of the info.

## 2019-01-05 ENCOUNTER — Telehealth: Payer: Self-pay | Admitting: *Deleted

## 2019-01-05 NOTE — Telephone Encounter (Signed)
Contacted by patient in response to letter sent to address on file.  States symptoms are better but not resolved and is currently being followed by her PCP for same.

## 2019-01-10 DIAGNOSIS — M545 Low back pain: Secondary | ICD-10-CM | POA: Diagnosis not present

## 2019-01-10 DIAGNOSIS — M4856XA Collapsed vertebra, not elsewhere classified, lumbar region, initial encounter for fracture: Secondary | ICD-10-CM | POA: Diagnosis not present

## 2019-01-15 ENCOUNTER — Ambulatory Visit: Payer: Medicare Other | Admitting: "Endocrinology

## 2019-01-23 DIAGNOSIS — M545 Low back pain: Secondary | ICD-10-CM | POA: Diagnosis not present

## 2019-01-25 ENCOUNTER — Telehealth (HOSPITAL_COMMUNITY): Payer: Self-pay | Admitting: Hematology

## 2019-01-25 NOTE — Telephone Encounter (Signed)
PC TO [employer] ALL APPS ARE STILL PENDING.

## 2019-01-26 ENCOUNTER — Encounter: Payer: Self-pay | Admitting: Hematology

## 2019-01-26 DIAGNOSIS — M545 Low back pain: Secondary | ICD-10-CM | POA: Diagnosis not present

## 2019-01-30 ENCOUNTER — Telehealth: Payer: Self-pay

## 2019-01-30 ENCOUNTER — Other Ambulatory Visit (HOSPITAL_COMMUNITY): Payer: Self-pay | Admitting: *Deleted

## 2019-01-30 ENCOUNTER — Telehealth (HOSPITAL_COMMUNITY): Payer: Self-pay | Admitting: *Deleted

## 2019-01-30 DIAGNOSIS — C9210 Chronic myeloid leukemia, BCR/ABL-positive, not having achieved remission: Secondary | ICD-10-CM

## 2019-01-30 MED ORDER — NILOTINIB HCL 150 MG PO CAPS: 300.0000 mg | ORAL_CAPSULE | Freq: Two times a day (BID) | ORAL | 6 refills | Status: DC

## 2019-01-30 NOTE — Telephone Encounter (Signed)
Lesslie is calling asking if Dr. Dorris Fetch has signed her Medical Release and faxed back for her surgical clearance, Please advise?

## 2019-01-30 NOTE — Telephone Encounter (Signed)
Done

## 2019-01-30 NOTE — Telephone Encounter (Signed)
Chart reviewed, tasigna refilled.   

## 2019-02-01 DIAGNOSIS — M1990 Unspecified osteoarthritis, unspecified site: Secondary | ICD-10-CM | POA: Diagnosis not present

## 2019-02-01 DIAGNOSIS — C9220 Atypical chronic myeloid leukemia, BCR/ABL-negative, not having achieved remission: Secondary | ICD-10-CM | POA: Diagnosis not present

## 2019-02-01 DIAGNOSIS — M4850XA Collapsed vertebra, not elsewhere classified, site unspecified, initial encounter for fracture: Secondary | ICD-10-CM | POA: Diagnosis not present

## 2019-02-05 DIAGNOSIS — M545 Low back pain: Secondary | ICD-10-CM | POA: Diagnosis not present

## 2019-02-06 ENCOUNTER — Encounter (HOSPITAL_COMMUNITY): Payer: Self-pay | Admitting: *Deleted

## 2019-02-06 NOTE — Progress Notes (Signed)
Dr. Walden Field completed hematology/oncology medical clearance forms for patient to have back surgery. It stated from a Hematology/oncology standpoint patient does not need to hold chemotherapy that she is taking for [facility] for surgery.   Form was faxed to Dr. Trenton Gammon office and copy was given to patient.

## 2019-02-12 ENCOUNTER — Other Ambulatory Visit: Payer: Self-pay

## 2019-02-12 ENCOUNTER — Ambulatory Visit: Payer: Self-pay | Admitting: Orthopedic Surgery

## 2019-02-12 ENCOUNTER — Encounter (HOSPITAL_COMMUNITY): Payer: Self-pay | Admitting: *Deleted

## 2019-02-12 NOTE — H&P (Signed)
Subjective:   For location, patient reports midline (and across low back). For quality, she reports not changing. For severity, she reports pain level 9/10. For duration, she reports 1 months. For timing, she reports acute. For alleviating factors, she reports sitting, heat, and narcotics (hydrocodone- does not help). For aggravating factors, she reports standing, lying down, walking, and getting out of bed. For previous surgery, she reports none. For prior imaging, she reports x ray (@ eo) and ct scan.  Patient Active Problem List   Diagnosis Date Noted  . Sweating abnormality 10/11/2017  . Iron deficiency anemia 02/16/2017  . Pleural effusion 10/23/2016  . Solitary pulmonary nodule 10/23/2016  . Anemia 11/21/2015  . Bladder prolapse, female, acquired 11/21/2015  . IBS (irritable bowel syndrome) 11/21/2015  . Neutrophilic leukocytosis 47/82/9562  . OA (osteoarthritis) of knee 11/21/2015  . CML (chronic myeloid leukemia) ([place]) 11/20/2015  . Biliary dyskinesia 12/04/2013  . Dyspepsia 11/30/2012  . Rib pain 11/01/2011  . Colon cancer screening 09/21/2011  . Lipoma 07/27/2011  . Lipoma of arm 07/27/2011  . Mass of arm 07/08/2011  . PUD 05/20/2010  . ABDOMINAL PAIN RIGHT UPPER QUADRANT 02/12/2010  . Hypothyroidism 02/11/2010  . NAUSEA 02/11/2010  . UTI'S, HX OF 02/11/2010   Past Medical History:  Diagnosis Date  . 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 Gastric ulcer 2011   EGD, 5/11  . Hypothyroidism    not on meds, followed by Dr. Elyse Hsu  . Leukocytosis 11/20/2015  . Lipoma    left upper arm  . Pericardial effusion    a. HCAP complicated by pericardial effusion requiring pericardial window 10/2016 and large L pleural effusion requring VATS.  Marland Kitchen Pleural effusion    a. s/p VATS 2017.  Marland Kitchen PONV (postoperative nausea and vomiting)   . Prolonged QT interval   . UTI (lower urinary tract infection)    frequent    Past Surgical History:   Procedure Laterality Date  . BRAVO [place] STUDY  12/04/2012   Procedure: BRAVO [place];  Surgeon: [name], MD;  [facility];  Service: Endoscopy;  Laterality: N/A;  . CHOLECYSTECTOMY N/A 01/03/2014   Procedure: LAPAROSCOPIC CHOLECYSTECTOMY WITH INTRAOPERATIVE CHOLANGIOGRAM;  Surgeon: Joyice Faster. Cornett, MD;  Location: [place];  Service: General;  Laterality: N/A;  . COLONOSCOPY  01/2004   DR [facility], few small tics  . ESOPHAGOGASTRODUODENOSCOPY  02/2010   gastric ulcers  . ESOPHAGOGASTRODUODENOSCOPY  12/04/2012   [id number] gastritis (inflammation) was found in the gastric antrum; multiple biopsies The duodenal mucosa showed no abnormalities in the bulb and second portion of the duodenum  . LIPOMA EXCISION  08/02/2011   left shoulder  . NOSE SURGERY    . PARTIAL HYSTERECTOMY     vaginal at age 32 years of age  . TOE DEBRIDEMENT  1962   lt great toe  . VIDEO ASSISTED THORACOSCOPY (VATS)/EMPYEMA Left 10/25/2016   Procedure: VIDEO ASSISTED THORACOSCOPY (VATS), BRONCH,DRAINAGE OF PLEURAL EFFUSION,PERICARDIAL WINDOW WITH DRAINAGE OF PERICARDIAL FLUID, TEE;  Surgeon: [name], MD;  Location: [place];  Service: Thoracic;  Laterality: Left;  Marland Kitchen VIDEO BRONCHOSCOPY N/A 10/25/2016   Procedure: VIDEO BRONCHOSCOPY;  Surgeon: [name], MD;  Location: [facility] OR;  Service: Thoracic;  Laterality: N/A;    Current Outpatient Medications  Medication Sig Dispense Refill Last Dose  . cephALEXin (KEFLEX) 500 MG capsule Take 1 capsule (500 mg total) by mouth 4 (four) times daily. (Patient not taking: Reported on 02/09/2019)  40 capsule 0 Completed Course at Unknown time  . cetirizine (ZYRTEC) 10 MG tablet Take 10 mg by mouth daily.    12/23/2018 at Unknown time  . Cholecalciferol 2000 units CAPS Take 1 capsule (2,000 Units total) by mouth daily with breakfast. 30 each 6 12/23/2018 at Unknown time  . escitalopram (LEXAPRO) 20 MG tablet TAKE 1 AND 1/2 TABLET DAILY  (Patient taking differently: Take 30 mg by mouth daily. ) 60 tablet 5 12/23/2018 at Unknown time  . HYDROcodone-acetaminophen (NORCO/VICODIN) 5-325 MG tablet Take 1 tablet by mouth 3 (three) times daily as needed for moderate pain.     Marland Kitchen lactose free nutrition (BOOST) LIQD Take 237 mLs by mouth daily.     Marland Kitchen levothyroxine (SYNTHROID, LEVOTHROID) 125 MCG tablet Take 1 tablet (125 mcg total) by mouth daily before breakfast. 90 tablet 2 12/24/2018 at Unknown time  . Multiple Vitamins-Minerals (CENTRUM SILVER PO) Take 1 capsule by mouth daily.   12/23/2018 at Unknown time  . nilotinib (TASIGNA) 150 MG capsule Take 2 capsules (300 mg total) by mouth every 12 (twelve) hours. 120 capsule 6   . Probiotic CAPS Take 2 capsules by mouth daily.     . promethazine (PHENERGAN) 12.5 MG tablet Take 2 tablets (25 mg total) by mouth every 8 (eight) hours as needed for nausea or vomiting. (Patient not taking: Reported on 02/09/2019) 6 tablet 0 Completed Course at Unknown time  . vitamin B-12 (CYANOCOBALAMIN) 1000 MCG tablet Take 1,000 mcg by mouth daily.    12/23/2018 at Unknown time  . vitamin E 400 UNIT capsule Take 800 Units by mouth daily.    12/23/2018 at Unknown time   No current facility-administered medications for this visit.    Allergies  Allergen Reactions  . Codeine Nausea Only    Headache  . Esomeprazole Magnesium Nausea Only  . Sulfonamide Derivatives Nausea Only    Headache  . Fluconazole Rash    Redness and blistering on left thigh    Social History   Tobacco Use  . Smoking status: Never Smoker  . Smokeless tobacco: Never Used  Substance Use Topics  . Alcohol use: No    Alcohol/week: 0.0 standard drinks    Family History  Problem Relation Age of Onset  . Lung cancer Father   . COPD Mother   . Colon cancer Neg Hx   . Anesthesia problems Neg Hx   . Hypotension Neg Hx   . Malignant hyperthermia Neg Hx   . Pseudochol deficiency Neg Hx       Objective:   Clinical exam: Andrea Martinez is a  pleasant individual, who appears younger than their stated age. She Is alert and orientated 3. No shortness of breath, chest pain. Abdomen is soft and non-tender, negative loss of bowel and bladder control, no rebound tenderness. Negative: skin lesions abrasions contusions  Peripheral pulses: 1+ dorsalis pedis/posterior tibialis pulses. Compartment soft and nontender.  Gait pattern: Patient stands in a slightly forward flexed posture with significant upper lumbar pain.  Assistive devices: No assistive devices for ambulation  Neuro: 5/5 motor strength in the lower extremity bilaterally. No numbness or dysesthesias in the lower extremity. Negative nerve root tension signs. Negative Babinski test, no clonus, 1+ deep tendon reflexes at the knee and Achilles.  Musculoskeletal: Patient has bilateral osteoarthritis of the knees and pain with range of motion. No significant hip or ankle pain with isolated joint range of motion significant upper lumbar/lower thoracic pain with direct palpation and attempts at range of motion.  She does have chronic low back pain but this is not significantly changed. No SI joint pain.  CT of the abdomen was completed on 12/24/18: Patient has Schmorl's node at T12 mild superior endplate compression at L1 and it does indicate the L1 compression is increased since the previous study.  X-rays here in my office today were compared to those from 01/10/19. It does appear that the patient has a stable L1 and T12 compression deformity. The T12 vertebral body appears more like a compression fracture than a Schmorl's node.  MRI of lumbar spine dated 01/26/2019 shows acute L1 40% wedge compression fracture with minimal posterior cortical retropulsion with only mild central canal stenosis, findings consistent with insufficiency fracture.  There is also a small subacute superior endplate Schmorl's node fracture of T12 with disc bulge.  Assessment:   T12, L1 compression fracture  Patient  continues to have severe debilitating back pain. Imaging studies demonstrate acute compression fractures at T12 and L1 consistent with her x-rays. There is no evidence of any suspicious marrow signal abnormality to suggest metastatic disease.    Plan:   T12, L1 kyphoplasty  At this point time we will plan on moving forward with a 2 level kyphoplasty as soon as possible. I have gone over the risks and benefits of surgery and all of their questions were encouraged and addressed. Risks of surgery include: Infection, bleeding, death, stroke, paralysis, nerve damage, leak of cement, need for additional surgery including open decompression. Ongoing or worse pain.    Goals of surgery: Reduction in pain, and improvement in quality of life. We will try and plan on moving forward with a 2 level procedure this week.  Patient will follow-up in office 2 weeks s/p surgery, sooner if needed.

## 2019-02-13 NOTE — Anesthesia Preprocedure Evaluation (Addendum)
Anesthesia Evaluation  Patient identified by MRN, date of birth, ID band Patient awake    Reviewed: Allergy & Precautions, H&P , NPO status , Patient's Chart, lab work & pertinent test results  History of Anesthesia Complications (+) PONV  Airway Mallampati: II  TM Distance: >3 FB Neck ROM: Full    Dental no notable dental hx. (+) Teeth Intact, Dental Advisory Given   Pulmonary neg pulmonary ROS, resolved,    Pulmonary exam normal breath sounds clear to auscultation       Cardiovascular negative cardio ROS   Rhythm:Regular Rate:Normal     Neuro/Psych negative neurological ROS  negative psych ROS   GI/Hepatic Neg liver ROS, PUD,   Endo/Other  Hypothyroidism   Renal/GU negative Renal ROS  negative genitourinary   Musculoskeletal  (+) Arthritis , Osteoarthritis,    Abdominal   Peds  Hematology  (+) Blood dyscrasia, anemia ,   Anesthesia Other Findings   Reproductive/Obstetrics negative OB ROS                            Anesthesia Physical Anesthesia Plan  ASA: II  Anesthesia Plan: General   Post-op Pain Management:    Induction: Intravenous  PONV Risk Score and Plan: 4 or greater and Ondansetron and Dexamethasone  Airway Management Planned: Oral ETT  Additional Equipment:   Intra-op Plan:   Post-operative Plan: Extubation in OR  Informed Consent: I have reviewed the patients History and Physical, chart, labs and discussed the procedure including the risks, benefits and alternatives for the proposed anesthesia with the patient or authorized representative who has indicated his/her understanding and acceptance.     Dental advisory given  Plan Discussed with: CRNA  Anesthesia Plan Comments: (See PAT note by Karoline Caldwell, PA-C )       Anesthesia Quick Evaluation

## 2019-02-13 NOTE — Progress Notes (Signed)
Anesthesia Chart Review: SAME DAY WORKUP   Case:  [id number] Date/Time:  02/14/19 1350   Procedure:  KYPHOPLASTY T12 and L1 (N/A ) - 120 mins   Anesthesia type:  General   Pre-op diagnosis:  T12 and L1 compression fracture   [facility] / [facility]   Surgeon:  Melina Schools, MD      DISCUSSION: 73 yo female never smoker. Pertinent hx includes PONV, Hypothyroid, CML, Gastric ulcer.  In 2017 pt had HCAP complicated by pericardial effusion requiring pericardial window and large L pleural effusion requring VATS. She continues to follow up with both cardiology and CT surgery. Last cardiology followup 04/27/2018. Per note the pt was doing well, asymptomatic from CV standpoint, no additional testing recommended, advised to f/u in one year. Last CT surgery f/u 08/01/2018. Per note the pt is being followed for some small lung nodules that have been stable, recommend 1 year f/u with CT.  Pt has clearances from PCP, Endocrinology, and Oncology. Copies on chart.  Anticipate she can proceed as planned barring acute status change.   VS: There were no vitals taken for this visit.  PROVIDERS: Sinda Du, MD is PCP  Zoila Shutter, MD is Oncologist  Loni Beckwith, MD is Endocrinologist  Kate Sable, MD is Cardiologist  LABS: Will need DOS labs  Labs Reviewed - No data to display   IMAGES: CHEST - 2 VIEW 12/24/18  COMPARISON:  09/20/2018  FINDINGS: Cardiac enlargement. No vascular congestion, edema, or consolidation. Linear fibrosis in the lung bases, unchanged. No blunting of costophrenic angles. No pneumothorax. Mediastinal contours appear intact. Degenerative changes in the spine.  IMPRESSION: Cardiac enlargement. No evidence of active pulmonary disease.   EKG: 12/24/2018: Sinus rhythm. Rate 66.  CV: TEE 10/25/2016 (in setting of acute illness, pt subsequently had pericardial window and VATS):  Left ventricle: Normal wall thickness. Cavity is small. LV  systolic function is low normal with an EF of 50-55%. There are no obvious wall motion abnormalities.  Pericardium: There is a left pleural effusion.  Left pleural effusion present. Very large  Pericardium: Right atrial free wall indentation present. Small circumferential pericardial effusion. Effusion is fluid.  Septum: No Patent Foramen [place]vale present. The interatrial septum bows to the left consistent with high right atrial pressure.  Right ventricle: Normal wall thickness. Cavity is small. Mildly reduced systolic function.  TEE 07/14/2015: Study Conclusions  - Left ventricle: The cavity size was normal. Wall thickness was   increased in a pattern of mild LVH. Systolic function was normal.   The estimated ejection fraction was in the range of 60% to 65%.   Wall motion was normal; there were no regional wall motion   abnormalities. Diastolic dysfunction noted.  Past Medical History:  Diagnosis Date  . Arthritis   . 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 Gastric ulcer 2011   EGD, 5/11  . Hypothyroidism    not on meds, followed by Dr. Elyse Hsu  . Leukocytosis 11/20/2015  . Lipoma    left upper arm  . Pericardial effusion    a. HCAP complicated by pericardial effusion requiring pericardial window 10/2016 and large L pleural effusion requring VATS.  Marland Kitchen Pleural effusion    a. s/p VATS 2017.  Marland Kitchen Pneumonia 10/2016  . PONV (postoperative nausea and vomiting)   . Prolonged QT interval   . Urine incontinence   . UTI (lower urinary tract infection)    frequent    Past Surgical History:  Procedure Laterality Date  . BRAVO [place] STUDY  12/04/2012   Procedure: BRAVO [place];  Surgeon: [name], MD;  [facility];  Service: Endoscopy;  Laterality: N/A;  . CHOLECYSTECTOMY N/A 01/03/2014   Procedure: LAPAROSCOPIC CHOLECYSTECTOMY WITH INTRAOPERATIVE CHOLANGIOGRAM;  Surgeon: Joyice Faster. Cornett, MD;  Location: [place];   Service: General;  Laterality: N/A;  . COLONOSCOPY  01/2004   DR [facility], few small tics  . ESOPHAGOGASTRODUODENOSCOPY  02/2010   gastric ulcers  . ESOPHAGOGASTRODUODENOSCOPY  12/04/2012   [id number] gastritis (inflammation) was found in the gastric antrum; multiple biopsies The duodenal mucosa showed no abnormalities in the bulb and second portion of the duodenum  . LIPOMA EXCISION  08/02/2011   left shoulder  . NOSE SURGERY    . PARTIAL HYSTERECTOMY     vaginal at age 85 years of age  . TOE DEBRIDEMENT Left 1962   lt great toe  . VIDEO ASSISTED THORACOSCOPY (VATS)/EMPYEMA Left 10/25/2016   Procedure: VIDEO ASSISTED THORACOSCOPY (VATS), BRONCH,DRAINAGE OF PLEURAL EFFUSION,PERICARDIAL WINDOW WITH DRAINAGE OF PERICARDIAL FLUID, TEE;  Surgeon: [name], MD;  Location: [place];  Service: Thoracic;  Laterality: Left;  Marland Kitchen VIDEO BRONCHOSCOPY N/A 10/25/2016   Procedure: VIDEO BRONCHOSCOPY;  Surgeon: [name], MD;  Location: [place];  Service: Thoracic;  Laterality: N/A;    MEDICATIONS: No current facility-administered medications for this encounter.    . cetirizine (ZYRTEC) 10 MG tablet  . Cholecalciferol 2000 units CAPS  . escitalopram (LEXAPRO) 20 MG tablet  . HYDROcodone-acetaminophen (NORCO/VICODIN) 5-325 MG tablet  . lactose free nutrition (BOOST) LIQD  . levothyroxine (SYNTHROID, LEVOTHROID) 125 MCG tablet  . Multiple Vitamins-Minerals (CENTRUM SILVER PO)  . nilotinib (TASIGNA) 150 MG capsule  . Probiotic CAPS  . vitamin B-12 (CYANOCOBALAMIN) 1000 MCG tablet  . vitamin E 400 UNIT capsule  . cephALEXin (KEFLEX) 500 MG capsule  . promethazine (PHENERGAN) 12.5 MG tablet    Wynonia Musty [facility] Short Stay Center/Anesthesiology Phone [phone] 02/13/2019 10:56 AM

## 2019-02-14 ENCOUNTER — Ambulatory Visit (HOSPITAL_COMMUNITY): Payer: Medicare Other | Admitting: Physician Assistant

## 2019-02-14 ENCOUNTER — Ambulatory Visit (HOSPITAL_COMMUNITY): Payer: Medicare Other

## 2019-02-14 ENCOUNTER — Observation Stay (HOSPITAL_COMMUNITY)
Admission: RE | Admit: 2019-02-14 | Discharge: 2019-02-15 | Disposition: A | Discharge status: Home / Self Care | Payer: Medicare Other | Attending: Orthopedic Surgery | Admitting: Orthopedic Surgery

## 2019-02-14 ENCOUNTER — Other Ambulatory Visit: Payer: Self-pay

## 2019-02-14 ENCOUNTER — Encounter (HOSPITAL_COMMUNITY): Payer: Self-pay

## 2019-02-14 ENCOUNTER — Encounter (HOSPITAL_COMMUNITY)
Admission: RE | Disposition: A | Discharge status: Home / Self Care | Payer: Self-pay | Source: Home / Self Care | Attending: Orthopedic Surgery

## 2019-02-14 DIAGNOSIS — Z79890 Hormone replacement therapy (postmenopausal): Secondary | ICD-10-CM | POA: Insufficient documentation

## 2019-02-14 DIAGNOSIS — M546 Pain in thoracic spine: Secondary | ICD-10-CM | POA: Diagnosis not present

## 2019-02-14 DIAGNOSIS — S22000A Wedge compression fracture of unspecified thoracic vertebra, initial encounter for closed fracture: Secondary | ICD-10-CM | POA: Diagnosis present

## 2019-02-14 DIAGNOSIS — Z981 Arthrodesis status: Secondary | ICD-10-CM | POA: Diagnosis not present

## 2019-02-14 DIAGNOSIS — M545 Low back pain: Secondary | ICD-10-CM | POA: Diagnosis not present

## 2019-02-14 DIAGNOSIS — Z01818 Encounter for other preprocedural examination: Secondary | ICD-10-CM | POA: Diagnosis not present

## 2019-02-14 DIAGNOSIS — E039 Hypothyroidism, unspecified: Secondary | ICD-10-CM | POA: Insufficient documentation

## 2019-02-14 DIAGNOSIS — C9210 Chronic myeloid leukemia, BCR/ABL-positive, not having achieved remission: Secondary | ICD-10-CM | POA: Diagnosis not present

## 2019-02-14 DIAGNOSIS — Z79899 Other long term (current) drug therapy: Secondary | ICD-10-CM | POA: Insufficient documentation

## 2019-02-14 DIAGNOSIS — M8008XA Age-related osteoporosis with current pathological fracture, vertebra(e), initial encounter for fracture: Secondary | ICD-10-CM | POA: Diagnosis not present

## 2019-02-14 DIAGNOSIS — M4855XA Collapsed vertebra, not elsewhere classified, thoracolumbar region, initial encounter for fracture: Secondary | ICD-10-CM | POA: Diagnosis not present

## 2019-02-14 DIAGNOSIS — Z419 Encounter for procedure for purposes other than remedying health state, unspecified: Secondary | ICD-10-CM

## 2019-02-14 DIAGNOSIS — D509 Iron deficiency anemia, unspecified: Secondary | ICD-10-CM | POA: Diagnosis not present

## 2019-02-14 DIAGNOSIS — M8000XA Age-related osteoporosis with current pathological fracture, unspecified site, initial encounter for fracture: Principal | ICD-10-CM | POA: Insufficient documentation

## 2019-02-14 DIAGNOSIS — M8088XA Other osteoporosis with current pathological fracture, vertebra(e), initial encounter for fracture: Secondary | ICD-10-CM | POA: Diagnosis not present

## 2019-02-14 DIAGNOSIS — J984 Other disorders of lung: Secondary | ICD-10-CM | POA: Diagnosis not present

## 2019-02-14 HISTORY — PX: KYPHOPLASTY: SHX5884

## 2019-02-14 HISTORY — DX: Unspecified urinary incontinence: R32

## 2019-02-14 HISTORY — DX: Unspecified osteoarthritis, unspecified site: M19.90

## 2019-02-14 HISTORY — DX: Pneumonia, unspecified organism: J18.9

## 2019-02-14 LAB — CBC
HCT: 40.4 % (ref 36.0–46.0)
Hemoglobin: 12.2 g/dL (ref 12.0–15.0)
MCH: 27.9 pg (ref 26.0–34.0)
MCHC: 30.2 g/dL (ref 30.0–36.0)
MCV: 92.2 fL (ref 80.0–100.0)
Platelets: 332 10*3/uL (ref 150–400)
RBC: 4.38 MIL/uL (ref 3.87–5.11)
RDW: 13.2 % (ref 11.5–15.5)
WBC: 12.8 10*3/uL — ABNORMAL HIGH (ref 4.0–10.5)
nRBC: 0 % (ref 0.0–0.2)

## 2019-02-14 LAB — BASIC METABOLIC PANEL
Anion gap: 11 (ref 5–15)
BUN: 15 mg/dL (ref 8–23)
CALCIUM: 9.3 mg/dL (ref 8.9–10.3)
CO2: 22 mmol/L (ref 22–32)
Chloride: 106 mmol/L (ref 98–111)
Creatinine, Ser: 0.83 mg/dL (ref 0.44–1.00)
GFR calc Af Amer: >60 mL/min (ref >60)
GFR calc non Af Amer: >60 mL/min (ref >60)
Glucose, Bld: 101 mg/dL — ABNORMAL HIGH (ref 70–99)
Potassium: 4 mmol/L (ref 3.5–5.1)
Sodium: 139 mmol/L (ref 135–145)

## 2019-02-14 SURGERY — KYPHOPLASTY
Anesthesia: General | Site: Back

## 2019-02-14 MED ORDER — ONDANSETRON HCL 4 MG/2ML IJ SOLN
INTRAMUSCULAR | Status: AC
  Filled 2019-02-14: qty 2

## 2019-02-14 MED ORDER — PHENOL 1.4 % MT LIQD: 1.0000 | OROMUCOSAL | Status: DC | PRN

## 2019-02-14 MED ORDER — PROPOFOL 10 MG/ML IV BOLUS
INTRAVENOUS | Status: AC
  Filled 2019-02-14: qty 20

## 2019-02-14 MED ORDER — SODIUM CHLORIDE 0.9 % IV SOLN
INTRAVENOUS | Status: DC | PRN
  Administered 2019-02-14: 50 mL

## 2019-02-14 MED ORDER — ROCURONIUM BROMIDE 10 MG/ML (PF) SYRINGE
PREFILLED_SYRINGE | INTRAVENOUS | Status: DC | PRN
  Administered 2019-02-14: 50 mg via INTRAVENOUS
  Administered 2019-02-14: 30 mg via INTRAVENOUS

## 2019-02-14 MED ORDER — HYDROCODONE-ACETAMINOPHEN 5-325 MG PO TABS: 1.0000 | ORAL_TABLET | Freq: Four times a day (QID) | ORAL | 0 refills | Status: AC | PRN

## 2019-02-14 MED ORDER — HYDROMORPHONE HCL 1 MG/ML IJ SOLN
INTRAMUSCULAR | Status: AC
  Filled 2019-02-14: qty 1

## 2019-02-14 MED ORDER — ACETAMINOPHEN 650 MG RE SUPP: 650.0000 mg | RECTAL | Status: DC | PRN

## 2019-02-14 MED ORDER — PHENYLEPHRINE 40 MCG/ML (10ML) SYRINGE FOR IV PUSH (FOR BLOOD PRESSURE SUPPORT)
PREFILLED_SYRINGE | INTRAVENOUS | Status: AC
  Filled 2019-02-14: qty 20

## 2019-02-14 MED ORDER — OXYCODONE HCL 5 MG PO TABS
5.0000 mg | ORAL_TABLET | ORAL | Status: DC | PRN
  Administered 2019-02-15: 5 mg via ORAL
  Filled 2019-02-14: qty 1

## 2019-02-14 MED ORDER — ROCURONIUM BROMIDE 50 MG/5ML IV SOSY
PREFILLED_SYRINGE | INTRAVENOUS | Status: AC
  Filled 2019-02-14: qty 5

## 2019-02-14 MED ORDER — IOPAMIDOL (ISOVUE-300) INJECTION 61%
INTRAVENOUS | Status: DC | PRN
  Administered 2019-02-14: 50 mL

## 2019-02-14 MED ORDER — DEXAMETHASONE 4 MG PO TABS
4.0000 mg | ORAL_TABLET | Freq: Four times a day (QID) | ORAL | Status: DC
  Administered 2019-02-15 (×3): 4 mg via ORAL
  Filled 2019-02-14 (×3): qty 1

## 2019-02-14 MED ORDER — SCOPOLAMINE 1 MG/3DAYS TD PT72
MEDICATED_PATCH | TRANSDERMAL | Status: AC
  Administered 2019-02-14: 1.5 mg via TRANSDERMAL
  Filled 2019-02-14: qty 1

## 2019-02-14 MED ORDER — CEFAZOLIN SODIUM-DEXTROSE 1-4 GM/50ML-% IV SOLN
1.0000 g | Freq: Three times a day (TID) | INTRAVENOUS | Status: AC
  Administered 2019-02-14 – 2019-02-15 (×2): 1 g via INTRAVENOUS
  Filled 2019-02-14 (×2): qty 50

## 2019-02-14 MED ORDER — LIDOCAINE 2% (20 MG/ML) 5 ML SYRINGE
INTRAMUSCULAR | Status: DC | PRN
  Administered 2019-02-14: 100 mg via INTRAVENOUS

## 2019-02-14 MED ORDER — OXYCODONE HCL 5 MG/5ML PO SOLN: 5.0000 mg | Freq: Once | ORAL | Status: AC | PRN

## 2019-02-14 MED ORDER — METHOCARBAMOL 500 MG PO TABS: 500.0000 mg | ORAL_TABLET | Freq: Three times a day (TID) | ORAL | 0 refills | Status: AC | PRN

## 2019-02-14 MED ORDER — MENTHOL 3 MG MT LOZG: 1.0000 | LOZENGE | OROMUCOSAL | Status: DC | PRN

## 2019-02-14 MED ORDER — ACETAMINOPHEN 325 MG PO TABS: 650.0000 mg | ORAL_TABLET | ORAL | Status: DC | PRN

## 2019-02-14 MED ORDER — BACITRACIN-NEOMYCIN-POLYMYXIN 400-5-5000 EX OINT
TOPICAL_OINTMENT | CUTANEOUS | Status: AC
  Filled 2019-02-14: qty 1

## 2019-02-14 MED ORDER — OXYCODONE HCL 5 MG PO TABS
5.0000 mg | ORAL_TABLET | Freq: Once | ORAL | Status: AC | PRN
  Administered 2019-02-14: 5 mg via ORAL

## 2019-02-14 MED ORDER — BUPIVACAINE LIPOSOME 1.3 % IJ SUSP
20.0000 mL | INTRAMUSCULAR | Status: AC
  Administered 2019-02-14: 20 mL
  Filled 2019-02-14: qty 20

## 2019-02-14 MED ORDER — MIDAZOLAM HCL 5 MG/5ML IJ SOLN: INTRAMUSCULAR | Status: DC | PRN

## 2019-02-14 MED ORDER — LACTATED RINGERS IV SOLN: INTRAVENOUS | Status: DC

## 2019-02-14 MED ORDER — OXYCODONE HCL 5 MG PO TABS
10.0000 mg | ORAL_TABLET | ORAL | Status: DC | PRN
  Administered 2019-02-14 – 2019-02-15 (×3): 10 mg via ORAL
  Filled 2019-02-14 (×3): qty 2

## 2019-02-14 MED ORDER — ROCURONIUM BROMIDE 50 MG/5ML IV SOSY
PREFILLED_SYRINGE | INTRAVENOUS | Status: AC
  Filled 2019-02-14: qty 15

## 2019-02-14 MED ORDER — BUPIVACAINE-EPINEPHRINE 0.25% -1:200000 IJ SOLN
INTRAMUSCULAR | Status: DC | PRN
  Administered 2019-02-14: 30 mL

## 2019-02-14 MED ORDER — SODIUM CHLORIDE 0.9% FLUSH: 3.0000 mL | Freq: Two times a day (BID) | INTRAVENOUS | Status: DC

## 2019-02-14 MED ORDER — LIDOCAINE 2% (20 MG/ML) 5 ML SYRINGE
INTRAMUSCULAR | Status: AC
  Filled 2019-02-14: qty 10

## 2019-02-14 MED ORDER — SUCCINYLCHOLINE CHLORIDE 200 MG/10ML IV SOSY
PREFILLED_SYRINGE | INTRAVENOUS | Status: AC
  Filled 2019-02-14: qty 10

## 2019-02-14 MED ORDER — PROPOFOL 10 MG/ML IV BOLUS
INTRAVENOUS | Status: DC | PRN
  Administered 2019-02-14: 150 mg via INTRAVENOUS

## 2019-02-14 MED ORDER — ONDANSETRON HCL 4 MG PO TABS: 4.0000 mg | ORAL_TABLET | Freq: Four times a day (QID) | ORAL | Status: DC | PRN

## 2019-02-14 MED ORDER — DEXAMETHASONE SODIUM PHOSPHATE 10 MG/ML IJ SOLN
INTRAMUSCULAR | Status: AC
  Filled 2019-02-14: qty 1

## 2019-02-14 MED ORDER — IOPAMIDOL (ISOVUE-300) INJECTION 61%
INTRAVENOUS | Status: AC
  Filled 2019-02-14: qty 50

## 2019-02-14 MED ORDER — DEXAMETHASONE SODIUM PHOSPHATE 4 MG/ML IJ SOLN
4.0000 mg | Freq: Four times a day (QID) | INTRAMUSCULAR | Status: DC
  Administered 2019-02-14: 4 mg via INTRAVENOUS
  Filled 2019-02-14: qty 1

## 2019-02-14 MED ORDER — ONDANSETRON HCL 4 MG PO TABS: 4.0000 mg | ORAL_TABLET | Freq: Three times a day (TID) | ORAL | 0 refills | Status: DC | PRN

## 2019-02-14 MED ORDER — DEXAMETHASONE SODIUM PHOSPHATE 10 MG/ML IJ SOLN
INTRAMUSCULAR | Status: DC | PRN
  Administered 2019-02-14: 5 mg via INTRAVENOUS

## 2019-02-14 MED ORDER — SCOPOLAMINE 1 MG/3DAYS TD PT72
1.0000 | MEDICATED_PATCH | TRANSDERMAL | Status: DC
  Administered 2019-02-14: 1.5 mg via TRANSDERMAL

## 2019-02-14 MED ORDER — SODIUM CHLORIDE 0.9 % IV SOLN
INTRAVENOUS | Status: DC | PRN
  Administered 2019-02-14: 15 ug/min via INTRAVENOUS

## 2019-02-14 MED ORDER — BUPIVACAINE HCL (PF) 0.5 % IJ SOLN
INTRAMUSCULAR | Status: AC
  Filled 2019-02-14: qty 30

## 2019-02-14 MED ORDER — ACETAMINOPHEN 500 MG PO TABS
1000.0000 mg | ORAL_TABLET | Freq: Once | ORAL | Status: AC
  Administered 2019-02-14: 1000 mg via ORAL
  Filled 2019-02-14: qty 2

## 2019-02-14 MED ORDER — HYDROMORPHONE HCL 1 MG/ML IJ SOLN
0.2500 mg | INTRAMUSCULAR | Status: DC | PRN
  Administered 2019-02-14: 0.5 mg via INTRAVENOUS
  Administered 2019-02-14 (×2): 0.25 mg via INTRAVENOUS

## 2019-02-14 MED ORDER — CEFAZOLIN SODIUM-DEXTROSE 2-4 GM/100ML-% IV SOLN
2.0000 g | INTRAVENOUS | Status: AC
  Administered 2019-02-14: 2 g via INTRAVENOUS
  Filled 2019-02-14: qty 100

## 2019-02-14 MED ORDER — 0.9 % SODIUM CHLORIDE (POUR BTL) OPTIME
TOPICAL | Status: DC | PRN
  Administered 2019-02-14: 1000 mL

## 2019-02-14 MED ORDER — BUPIVACAINE-EPINEPHRINE (PF) 0.25% -1:200000 IJ SOLN
INTRAMUSCULAR | Status: AC
  Filled 2019-02-14: qty 30

## 2019-02-14 MED ORDER — LACTATED RINGERS IV SOLN
INTRAVENOUS | Status: DC
  Administered 2019-02-14 (×2): via INTRAVENOUS

## 2019-02-14 MED ORDER — ONDANSETRON HCL 4 MG/2ML IJ SOLN: 4.0000 mg | Freq: Four times a day (QID) | INTRAMUSCULAR | Status: DC | PRN

## 2019-02-14 MED ORDER — METHOCARBAMOL 500 MG PO TABS
500.0000 mg | ORAL_TABLET | Freq: Four times a day (QID) | ORAL | Status: DC | PRN
  Administered 2019-02-14 – 2019-02-15 (×2): 500 mg via ORAL
  Filled 2019-02-14 (×2): qty 1

## 2019-02-14 MED ORDER — BACITRACIN-NEOMYCIN-POLYMYXIN 400-5-5000 EX OINT
TOPICAL_OINTMENT | CUTANEOUS | Status: DC | PRN
  Administered 2019-02-14: 1 via TOPICAL

## 2019-02-14 MED ORDER — OXYCODONE HCL 5 MG PO TABS
ORAL_TABLET | ORAL | Status: AC
  Filled 2019-02-14: qty 1

## 2019-02-14 MED ORDER — METHOCARBAMOL 1000 MG/10ML IJ SOLN
500.0000 mg | Freq: Four times a day (QID) | INTRAVENOUS | Status: DC | PRN
  Administered 2019-02-15: 500 mg via INTRAVENOUS
  Filled 2019-02-14: qty 5

## 2019-02-14 MED ORDER — SUGAMMADEX SODIUM 200 MG/2ML IV SOLN
INTRAVENOUS | Status: DC | PRN
  Administered 2019-02-14: 200 mg via INTRAVENOUS

## 2019-02-14 MED ORDER — ONDANSETRON HCL 4 MG/2ML IJ SOLN
INTRAMUSCULAR | Status: DC | PRN
  Administered 2019-02-14: 4 mg via INTRAVENOUS

## 2019-02-14 MED ORDER — FENTANYL CITRATE (PF) 100 MCG/2ML IJ SOLN
INTRAMUSCULAR | Status: DC | PRN
  Administered 2019-02-14: 200 ug via INTRAVENOUS
  Administered 2019-02-14: 50 ug via INTRAVENOUS

## 2019-02-14 MED ORDER — SODIUM CHLORIDE 0.9% FLUSH: 3.0000 mL | INTRAVENOUS | Status: DC | PRN

## 2019-02-14 MED ORDER — SODIUM CHLORIDE 0.9 % IV SOLN: INTRAVENOUS | Status: DC | PRN

## 2019-02-14 MED ORDER — FENTANYL CITRATE (PF) 250 MCG/5ML IJ SOLN
INTRAMUSCULAR | Status: AC
  Filled 2019-02-14: qty 5

## 2019-02-14 MED ORDER — PROMETHAZINE HCL 25 MG/ML IJ SOLN: 6.2500 mg | INTRAMUSCULAR | Status: DC | PRN

## 2019-02-14 SURGICAL SUPPLY — 44 items
ADH SKN CLS APL DERMABOND .7 (GAUZE/BANDAGES/DRESSINGS) ×1
BANDAGE ADH SHEER 1  50/CT (GAUZE/BANDAGES/DRESSINGS) ×6 IMPLANT
BLADE SURG 15 STRL LF DISP TIS (BLADE) ×1 IMPLANT
BLADE SURG 15 STRL SS (BLADE) ×2
BONE FILLER DEVICE STRL SZ3 (INSTRUMENTS) IMPLANT
CEMENT KYPHON CX01A KIT/MIXER (Cement) ×15 IMPLANT
COVER LIGHT HANDLE  DEROYL (MISCELLANEOUS) ×3 IMPLANT
COVER WAND RF STERILE (DRAPES) ×3 IMPLANT
CURETTE EXPRESS SZ2 7MM (INSTRUMENTS) ×1 IMPLANT
CURETTE WEDGE 8.5MM KYPHX (MISCELLANEOUS) IMPLANT
CURRETTE EXPRESS SZ2 7MM (INSTRUMENTS) ×3
DERMABOND ADVANCED (GAUZE/BANDAGES/DRESSINGS) ×2
DERMABOND ADVANCED .7 DNX12 (GAUZE/BANDAGES/DRESSINGS) ×1 IMPLANT
DRAPE C-ARM 42X72 X-RAY (DRAPES) ×6 IMPLANT
DRAPE INCISE IOBAN 66X45 STRL (DRAPES) ×3 IMPLANT
DRAPE LAPAROTOMY T 102X78X121 (DRAPES) ×3 IMPLANT
DRAPE SURG 17X23 STRL (DRAPES) ×3 IMPLANT
DRAPE U-SHAPE 47X51 STRL (DRAPES) ×3 IMPLANT
DRAPE WARM FLUID 44X44 (DRAPE) ×3 IMPLANT
DURAPREP 26ML APPLICATOR (WOUND CARE) ×3 IMPLANT
GLOVE BIO SURGEON STRL SZ 6.5 (GLOVE) ×2 IMPLANT
GLOVE BIO SURGEONS STRL SZ 6.5 (GLOVE) ×1
GLOVE BIOGEL PI IND STRL 6.5 (GLOVE) ×1 IMPLANT
GLOVE BIOGEL PI IND STRL 8.5 (GLOVE) ×1 IMPLANT
GLOVE BIOGEL PI INDICATOR 6.5 (GLOVE) ×2
GLOVE BIOGEL PI INDICATOR 8.5 (GLOVE) ×2
GLOVE SS BIOGEL STRL SZ 8.5 (GLOVE) ×1 IMPLANT
GLOVE SUPERSENSE BIOGEL SZ 8.5 (GLOVE) ×2
GOWN STRL REUS W/ TWL LRG LVL3 (GOWN DISPOSABLE) ×2 IMPLANT
GOWN STRL REUS W/TWL 2XL LVL3 (GOWN DISPOSABLE) ×3 IMPLANT
GOWN STRL REUS W/TWL LRG LVL3 (GOWN DISPOSABLE) ×6
KIT BASIN OR (CUSTOM PROCEDURE TRAY) ×3 IMPLANT
KIT TURNOVER KIT B (KITS) ×3 IMPLANT
NEEDLE 21X1 OR PACK (NEEDLE) ×3 IMPLANT
NEEDLE SPNL 18GX3.5 QUINCKE PK (NEEDLE) ×6 IMPLANT
NS IRRIG 1000ML POUR BTL (IV SOLUTION) ×3 IMPLANT
PACK SURGICAL SETUP 50X90 (CUSTOM PROCEDURE TRAY) ×3 IMPLANT
PACK UNIVERSAL I (CUSTOM PROCEDURE TRAY) ×3 IMPLANT
PAD ARMBOARD 7.5X6 YLW CONV (MISCELLANEOUS) ×9 IMPLANT
SPONGE LAP 4X18 RFD (DISPOSABLE) ×3 IMPLANT
SUT MNCRL AB 3-0 PS2 18 (SUTURE) ×3 IMPLANT
SYR CONTROL 10ML LL (SYRINGE) ×3 IMPLANT
TOWEL OR 17X26 10 PK STRL BLUE (TOWEL DISPOSABLE) ×3 IMPLANT
TRAY KYPHOPAK 15/3 ONESTEP 1ST (MISCELLANEOUS) ×6 IMPLANT

## 2019-02-14 NOTE — Brief Op Note (Signed)
02/14/2019  2:51 PM  PATIENT:  Andrea Martinez  73 y.o. female  PRE-OPERATIVE DIAGNOSIS:  T12 and L1 compression fracture  POST-OPERATIVE DIAGNOSIS:  T12 and L1 compression fracture  PROCEDURE:  Procedure(s) with comments: KYPHOPLASTY T12 and L1 (N/A) - 120 mins  SURGEON:  Surgeon(s) and Role:    Melina Schools, MD - Primary  PHYSICIAN ASSISTANT:   ASSISTANTS: Amanda Ward   ANESTHESIA:   general  EBL:  Minimal    BLOOD ADMINISTERED:none  DRAINS: none   LOCAL MEDICATIONS USED:  MARCAINE    and OTHER experal  SPECIMEN:  No Specimen  DISPOSITION OF SPECIMEN:  N/A  COUNTS:  YES  TOURNIQUET:  * No tourniquets in log *  DICTATION: .Dragon Dictation  PLAN OF CARE: Admit for overnight observation  PATIENT DISPOSITION:  PACU - hemodynamically stable.

## 2019-02-14 NOTE — Transfer of Care (Signed)
Immediate Anesthesia Transfer of Care Note  Patient: Andrea Martinez  Procedure(s) Performed: KYPHOPLASTY T12 and L1 (N/A Back)  Patient Location: PACU  Anesthesia Type:General  Level of Consciousness: awake, alert  and oriented  Airway & Oxygen Therapy: Patient Spontanous Breathing and Patient connected to nasal cannula oxygen  Post-op Assessment: Report given to RN, Post -op Vital signs reviewed and stable and Patient moving all extremities  Post vital signs: Reviewed and stable  Last Vitals:  Vitals Value Taken Time  BP 154/65 02/14/2019  2:59 PM  Temp    Pulse 92 02/14/2019  3:00 PM  Resp 12 02/14/2019  3:00 PM  SpO2 97 % 02/14/2019  3:00 PM  Vitals shown include unvalidated device data.  Last Pain:  Vitals:   02/14/19 1010  TempSrc: Oral  PainSc: 8       Patients Stated Pain Goal: 3 (02/72/53 6644)  Complications: No apparent anesthesia complications

## 2019-02-14 NOTE — Op Note (Signed)
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  Imaging studies demonstrate compression fractures of T12 and L1.  MRI demonstrated edema indicating is acute to subacute compression deformity with no evidence of abnormal marrow signal changes.  Because of the ongoing significant pain we elected to move forward with a kyphoplasty.  All appropriate risks benefits and alternatives were discussed with the patient and her family consent was obtained.  Operative note patient was brought the operating room placed by the operating room table.  After successful induction of general anesthesia and endotracheal ovation teds SCDs were applied and she was turned prone onto the chest and pelvic roll.  Thoracolumbar spine was prepped and draped in a standard fashion after all bony prominences were well-padded and properly positioned.  Timeout was performed to confirm patient procedure and all other important data.  Using AP and lateral fluoroscopy identified the lateral border of the T12 and L1 pedicles.  In the AP plane I made sure I could identify the entire pedicle itself.  Using these 2 planes I placed a Jamshidi needle via small stab incision down to the lateral aspect of the pedicle I then advanced the Jamshidi needle using biplane fluoroscopy I advanced the Jamshidi needle down to the lateral aspect of the L1 pedicle.  I advanced into the pedicle I was nearing the medial wall of the pedicle I confirmed I was just beyond the posterior wall in the lateral view.  I then advanced into the pedicle itself.  I repeated this exact same procedure of using biplane fluoroscopy to guide my Jamshidi needle on the  contralateral side.  I also placed the Jamshidi needles at T12 in a similar fashion.  After cannulating the L1 pedicle I then placed the drill and curette and then finally inflatable bone tamp.  The bone tamp was inflated and then I deflated the left side and inserted 1-1/2 cc of cement.  I deflated the right side and placed another 1-1/2 cc of cement.  The cement flow was monitored using biplane fluoroscopy.  I then placed another half cc of cement on both sides for a total of 2 cc on each side.  I had excellent overall fill on both the left and the right-hand side.  There was no leak superiorly, inferiorly, laterally, posteriorly.  The cement was allowed to harden and then I remove the trocar.  I repeated this exact same procedure at T12.  After cannulating the pedicles in a similar fashion I placed the drill then the curette and then the little bone tamp.  I again inflated the cement this time using a total of 3 cc on each side.  Again had excellent overall fill with no leak inferiorly, posteriorly, laterally, or superiorly.  At this point time final x-rays were taken which demonstrated satisfactory overall position in both the AP and lateral planes of the cement mantle at T12 and L1 levels.  The skin was then cleaned and the small stab incisions were closed with a simple 3-0 Monocryl suture.  Neosporin and Band-Aids were applied and the patient was ultimately extubated transfer the PACU that incident.  The end of the case all needle sponge counts were correct.  There are no adverse intraoperative events.

## 2019-02-15 ENCOUNTER — Encounter (HOSPITAL_COMMUNITY): Payer: Self-pay | Admitting: Orthopedic Surgery

## 2019-02-15 DIAGNOSIS — M8000XA Age-related osteoporosis with current pathological fracture, unspecified site, initial encounter for fracture: Secondary | ICD-10-CM | POA: Diagnosis not present

## 2019-02-15 DIAGNOSIS — C9210 Chronic myeloid leukemia, BCR/ABL-positive, not having achieved remission: Secondary | ICD-10-CM | POA: Diagnosis not present

## 2019-02-15 DIAGNOSIS — M546 Pain in thoracic spine: Secondary | ICD-10-CM | POA: Diagnosis not present

## 2019-02-15 DIAGNOSIS — E039 Hypothyroidism, unspecified: Secondary | ICD-10-CM | POA: Diagnosis not present

## 2019-02-15 DIAGNOSIS — M545 Low back pain: Secondary | ICD-10-CM | POA: Diagnosis not present

## 2019-02-15 DIAGNOSIS — Z79899 Other long term (current) drug therapy: Secondary | ICD-10-CM | POA: Diagnosis not present

## 2019-02-15 NOTE — Evaluation (Signed)
Occupational Therapy Evaluation Patient Details Name: ARNOLD DEPINTO MRN: [id number] DOB: 1946/03/13 Today's Date: 02/15/2019    History of Present Illness Patient is a 73 y/o female who presents with T12 and L1 compression fx s/p kyphoplasty. 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    Clinical Impression   Patient evaluated by Occupational Therapy with no further acute OT needs identified. All education has been completed and the patient has no further questions.  All education completed.  Family will assist her with LB ADLs. Otherwise she is supervision for functional mobility and remainder of ADLs.  See below for any follow-up Occupational Therapy or equipment needs. OT is signing off. Thank you for this referral.      Follow Up Recommendations  No OT follow up    Equipment Recommendations  None recommended by OT    Recommendations for Other Services       Precautions / Restrictions Precautions Precautions: Fall;Back Precaution Booklet Issued: No Precaution Comments: no brace. Pt able to state all precautions and demonsrates good understanding  Restrictions Weight Bearing Restrictions: No      Mobility Bed Mobility Overal bed mobility: Needs Assistance Bed Mobility: Sidelying to Sit;Sit to Sidelying   Sidelying to sit: Supervision     Sit to sidelying: Supervision General bed mobility comments: verbal cues for technique   Transfers Overall transfer level: Needs assistance Equipment used: None Transfers: Sit to/from Stand;Stand Pivot Transfers Sit to Stand: Supervision Stand pivot transfers: Supervision       General transfer comment: Supervision for safety. Stood from [employer].     Balance Overall balance assessment: Needs assistance Sitting-balance support: Feet supported;No upper extremity supported Sitting balance-Leahy Scale: Good     Standing balance support: During functional activity Standing balance-Leahy Scale: Fair                              ADL either performed or assessed with clinical judgement   ADL Overall ADL's : Needs assistance/impaired Eating/Feeding: Independent   Grooming: Wash/dry face;Wash/dry hands;Oral care;Brushing hair;Supervision/safety;Standing Grooming Details (indicate cue type and reason): Pt able to verbalize safe technique for oral care  Upper Body Bathing: Supervision/ safety;Standing Upper Body Bathing Details (indicate cue type and reason): simulated shower in standing  Lower Body Bathing: Supervison/ safety;Sit to/from stand Lower Body Bathing Details (indicate cue type and reason): discussed use of LH sponge/brush Upper Body Dressing : Set up;Sitting   Lower Body Dressing: Maximal assistance;Sit to/from stand Lower Body Dressing Details (indicate cue type and reason): Pt unable to access feet withoug bending.  Discussed use of AE, but she prefers to have family assist her  Toilet Transfer: Supervision/safety;Ambulation;Comfort height toilet;Grab bars   Toileting- Clothing Manipulation and Hygiene: Supervision/safety;Sit to/from stand       Functional mobility during ADLs: Supervision/safety General ADL Comments: Family supportive.  discussed use of reacher      Vision Baseline Vision/History: No visual deficits Patient Visual Report: No change from baseline       Perception     Praxis      Pertinent Vitals/Pain Pain Assessment: Faces Pain Score: 4  Faces Pain Scale: Hurts little more Pain Location: back Pain Descriptors / Indicators: Sore;Operative site guarding Pain Intervention(s): Monitored during session     Hand Dominance Right   Extremity/Trunk Assessment Upper Extremity Assessment Upper Extremity Assessment: Overall WFL for tasks assessed   Lower Extremity Assessment Lower Extremity Assessment: Overall WFL for tasks assessed   Cervical /  Trunk Assessment Cervical / Trunk Assessment: Other exceptions Cervical / Trunk Exceptions: s/p spine surgery    Communication Communication Communication: No difficulties   Cognition Arousal/Alertness: Awake/alert Behavior During Therapy: WFL for tasks assessed/performed Overall Cognitive Status: Within Functional Limits for tasks assessed                                 General Comments: Pt very talkative and occasionally self distracts    General Comments  all education completed.     Exercises     Shoulder Instructions      Home Living Family/patient expects to be discharged to:: Private residence Living Arrangements: Spouse/significant other Available Help at Discharge: Family;Available PRN/intermittently Type of Home: House Home Access: Stairs to enter Entrance Stairs-Number of Steps: 1   Home Layout: Two level;Laundry or work area in basement;Able to live on main level with bedroom/bathroom Alternate Therapist, sports of Steps: 1 flight to basement   [employer] Shower/Tub: Occupational psychologist: Standard     Home Equipment: None   Additional Comments: family available to assist as needed       Prior Functioning/Environment Level of Independence: Independent        Comments: Cooks, cleans, does not drive. Likes to work on her garden.  However, pt reports she has required assist with LB ADLs and has had to sleep in recliner for the past couple of weeks         OT Problem List: Pain      OT Treatment/Interventions:      OT Goals(Current goals can be found in the care plan section) Acute Rehab OT Goals Patient Stated Goal: To go home, feel better and get back to cancer support group OT Goal Formulation: All assessment and education complete, DC therapy  OT Frequency:     Barriers to D/C:            Co-evaluation              AM-PAC OT "6 Clicks" Daily Activity     Outcome Measure Help from another person eating meals?: None Help from another person taking care of personal grooming?: None Help from another person toileting,  which includes using toliet, bedpan, or urinal?: None Help from another person bathing (including washing, rinsing, drying)?: None Help from another person to put on and taking off regular upper body clothing?: None Help from another person to put on and taking off regular lower body clothing?: A Lot 6 Click Score: 22   End of Session Nurse Communication: Mobility status  Activity Tolerance: Patient tolerated treatment well Patient left: in bed;with call bell/phone within reach;with family/visitor present  OT Visit Diagnosis: Pain Pain - part of body: (back )                Time: 2703-5009 OT Time Calculation (min): 20 min Charges:  OT General Charges $OT Visit: 1 Visit OT Evaluation $OT Eval Low Complexity: 1 Low  Lucille Passy, OTR/L Acute Rehabilitation Services Pager [phone] Office [phone]   Lucille Passy M 02/15/2019, 11:00 AM

## 2019-02-15 NOTE — Anesthesia Postprocedure Evaluation (Signed)
Anesthesia Post Note  Patient: Andrea Martinez  Procedure(s) Performed: KYPHOPLASTY T12 and L1 (N/A Back)     Patient location during evaluation: PACU Anesthesia Type: General Level of consciousness: awake and alert Pain management: pain level controlled Vital Signs Assessment: post-procedure vital signs reviewed and stable Respiratory status: spontaneous breathing, nonlabored ventilation and respiratory function stable Cardiovascular status: blood pressure returned to baseline and stable Postop Assessment: no apparent nausea or vomiting Anesthetic complications: no    Last Vitals:  Vitals:   02/15/19 0724 02/15/19 1144  BP: 91/80 (!) 111/56  Pulse: 63 (!) 58  Resp: 18 19  Temp: (!) 36.4 C 36.6 C  SpO2: 96% 96%    Last Pain:  Vitals:   02/15/19 1144  TempSrc: Oral  PainSc:    Pain Goal: Patients Stated Pain Goal: 3 (02/15/19 0409)                 Lynda Rainwater

## 2019-02-15 NOTE — Evaluation (Signed)
Physical Therapy Evaluation Patient Details Name: Andrea Martinez MRN: [id number] DOB: 07-16-1946 Today's Date: 02/15/2019   History of Present Illness  Patient is a 73 y/o female who presents with T12 and L1 compression fx s/p kyphoplasty. 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   Clinical Impression  Patient presents with pain and post surgical deficits s/p above surgery. Tolerated transfers and gait training with supervision for safety. Pt independent PTA and lives with spouse. Education re: back precautions, positioning, exercise prescription etc. Education completed and all questions answered. Pt does not require skilled therapy services as pt functioning close to baseline and has assist at home. Discharge from therapy.     Follow Up Recommendations No PT follow up;Supervision - Intermittent    Equipment Recommendations  None recommended by PT    Recommendations for Other Services       Precautions / Restrictions Precautions Precautions: Fall;Back Precaution Booklet Issued: No Restrictions Weight Bearing Restrictions: No      Mobility  Bed Mobility Overal bed mobility: Needs Assistance Bed Mobility: Sidelying to Sit;Sit to Sidelying   Sidelying to sit: Supervision;HOB elevated     Sit to sidelying: Supervision;HOB elevated General bed mobility comments: Cues for log roll technique.   Transfers Overall transfer level: Needs assistance Equipment used: None Transfers: Sit to/from Stand Sit to Stand: Supervision         General transfer comment: Supervision for safety. Stood from [employer].   Ambulation/Gait Ambulation/Gait assistance: Supervision Gait Distance (Feet): 300 Feet Assistive device: None Gait Pattern/deviations: Step-through pattern;Decreased stride length   Gait velocity interpretation: 1.31 - 2.62 ft/sec, indicative of limited community ambulator General Gait Details: Slow, steady gait with good upright posture. 2/4 DOE.   Stairs             Wheelchair Mobility    Modified Rankin (Stroke Patients Only)       Balance Overall balance assessment: Needs assistance Sitting-balance support: Feet supported;No upper extremity supported Sitting balance-Leahy Scale: Good     Standing balance support: During functional activity Standing balance-Leahy Scale: Fair                               Pertinent Vitals/Pain Pain Assessment: 0-10 Pain Score: 4  Pain Location: back Pain Descriptors / Indicators: Sore;Operative site guarding Pain Intervention(s): Monitored during session;Repositioned    Home Living Family/patient expects to be discharged to:: Private residence Living Arrangements: Spouse/significant other Available Help at Discharge: Family;Available PRN/intermittently Type of Home: House Home Access: Stairs to enter   Entrance Stairs-Number of Steps: 1 Home Layout: Two level;Laundry or work area in basement;Able to live on main level with [employer]: None      Prior Function Level of Independence: Independent         Comments: Cooks, cleans, does not drive. Likes to work on her garden.     Hand Dominance        Extremity/Trunk Assessment   Upper Extremity Assessment Upper Extremity Assessment: Defer to OT evaluation    Lower Extremity Assessment Lower Extremity Assessment: Overall WFL for tasks assessed    Cervical / Trunk Assessment Cervical / Trunk Assessment: Other exceptions Cervical / Trunk Exceptions: s/p spine surgery  Communication   Communication: No difficulties  Cognition Arousal/Alertness: Awake/alert Behavior During Therapy: WFL for tasks assessed/performed Overall Cognitive Status: Within Functional Limits for tasks assessed  General Comments      Exercises     Assessment/Plan    PT Assessment Patent does not need any further PT services  PT Problem List         PT Treatment  Interventions      PT Goals (Current goals can be found in the Care Plan section)  Acute Rehab PT Goals Patient Stated Goal: to go home and get back to walking and to her garden PT Goal Formulation: All assessment and education complete, DC therapy    Frequency     Barriers to discharge        Co-evaluation               AM-PAC PT "6 Clicks" Mobility  Outcome Measure Help needed turning from your back to your side while in a flat bed without using bedrails?: A Little Help needed moving from lying on your back to sitting on the side of a flat bed without using bedrails?: A Little Help needed moving to and from a bed to a chair (including a wheelchair)?: None Help needed standing up from a chair using your arms (e.g., wheelchair or bedside chair)?: None Help needed to walk in hospital room?: None Help needed climbing 3-5 steps with a railing? : A Little 6 Click Score: 21    End of Session Equipment Utilized During Treatment: Gait belt Activity Tolerance: Patient tolerated treatment well Patient left: in bed;with call bell/phone within reach(sitting EOB) Nurse Communication: Mobility status PT Visit Diagnosis: Pain;Difficulty in walking, not elsewhere classified (R26.2) Pain - part of body: (back)    Time: 1157-2620 PT Time Calculation (min) (ACUTE ONLY): 27 min   Charges:   PT Evaluation $PT Eval Low Complexity: 1 Low PT Treatments $Gait Training: 8-22 mins        Andrea Martinez, PT, DPT Acute Rehabilitation Services Pager [phone] Office [phone]      Andrea Martinez 02/15/2019, 8:14 AM

## 2019-02-15 NOTE — Progress Notes (Signed)
Patient is discharged from room 3C02 at this time. Alert and in stable condition. IV site d/c'd and instructions read to patient and family with understanding verbalized. Left unit via wheelchair with all belongings at side. 

## 2019-02-15 NOTE — Progress Notes (Signed)
    Subjective: Procedure(s) (LRB): KYPHOPLASTY T12 and L1 (N/A) 1 Day Post-Op  Patient reports pain as 2 on 0-10 scale.  Reports decreased leg pain reports incisional back pain   Positive void Positive bowel movement Positive flatus Negative chest pain or shortness of breath  Objective: Vital signs in last 24 hours: Temp:  [97 F (36.1 C)-97.8 F (36.6 C)] 97.8 F (36.6 C) (03/05 1144) Pulse Rate:  [58-92] 58 (03/05 1144) Resp:  [12-20] 19 (03/05 1144) BP: (91-155)/(51-80) 111/56 (03/05 1144) SpO2:  [91 %-100 %] 96 % (03/05 1144) Weight:  [103.4 kg] 103.4 kg (03/05 0827)  Intake/Output from previous day: 03/04 0701 - 03/05 0700 In: 1034.8 [P.O.:200; I.V.:700; IV Piggyback:134.8] Out: -   Labs: Recent Labs    02/14/19 0949  WBC 12.8*  RBC 4.38  HCT 40.4  PLT 332   Recent Labs    02/14/19 0949  NA 139  K 4.0  CL 106  CO2 22  BUN 15  CREATININE 0.83  GLUCOSE 101*  CALCIUM 9.3   No results for input(s): LABPT, INR in the last 72 hours.  Physical Exam: Neurologically intact ABD soft Intact pulses distally Incision: dressing C/D/I and no drainage Compartment soft Body mass index is 36.8 kg/m.   Assessment/Plan: Patient stable  xrays n/a Continue mobilization with physical therapy Continue care  Advance diet Up with therapy  Plan on d/c to home  Melina Schools, MD [facility] [phone]

## 2019-02-16 NOTE — Telephone Encounter (Signed)
Tried contacting pt multiple times with no answer or return call.

## 2019-02-27 NOTE — Discharge Summary (Signed)
Patient ID: BETHAN ADAMEK MRN: [id number] DOB/AGE: 1946-06-16 73 y.o.  Admit date: 02/14/2019 Discharge date: 02/27/2019  Admission Diagnoses:  Active Problems:   Thoracic compression fracture [facility])   Discharge Diagnoses:  Active Problems:   Thoracic compression fracture (HCC)  status post Procedure(s): KYPHOPLASTY T12 and L1  Past Medical History:  Diagnosis Date  . Arthritis   . 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 Gastric ulcer 2011   EGD, 5/11  . Hypothyroidism    not on meds, followed by Dr. Elyse Hsu  . Leukocytosis 11/20/2015  . Lipoma    left upper arm  . Pericardial effusion    a. HCAP complicated by pericardial effusion requiring pericardial window 10/2016 and large L pleural effusion requring VATS.  Marland Kitchen Pleural effusion    a. s/p VATS 2017.  Marland Kitchen Pneumonia 10/2016  . PONV (postoperative nausea and vomiting)   . Prolonged QT interval   . Urine incontinence   . UTI (lower urinary tract infection)    frequent    Surgeries: Procedure(s): KYPHOPLASTY T12 and L1 on 02/14/2019   Consultants:   Discharged Condition: Improved  Hospital Course: Andrea Martinez is an 73 y.o. female who was admitted 02/14/2019 for operative treatment of T 12 and L1 compression fractures. Patient failed conservative treatments (please see the history and physical for the specifics) and had severe unremitting pain that affects sleep, daily activities and work/hobbies. After pre-op clearance, the patient was taken to the operating room on 02/14/2019 and underwent  Procedure(s): KYPHOPLASTY T12 and L1.    Patient was given perioperative antibiotics:  Anti-infectives (From admission, onward)   Start     Dose/Rate Route Frequency Ordered Stop   02/14/19 2100  ceFAZolin (ANCEF) IVPB 1 g/50 mL premix     1 g 100 mL/hr over 30 Minutes Intravenous Every 8 hours 02/14/19 1648 02/15/19 0952   02/14/19 0929  ceFAZolin (ANCEF) IVPB 2g/100 mL premix     2 g 200 mL/hr  over 30 Minutes Intravenous 30 min pre-op 02/14/19 0929 02/14/19 1340       Patient was given sequential compression devices and early ambulation to prevent DVT.   Patient benefited maximally from hospital stay and there were no complications. At the time of discharge, the patient was urinating/moving their bowels without difficulty, tolerating a regular diet, pain is controlled with oral pain medications and they have been cleared by PT/OT.   Recent vital signs:  Today's Vitals   02/15/19 0827 02/15/19 0954 02/15/19 1052 02/15/19 1144  BP:    (!) 111/56  Pulse:    (!) 58  Resp:    19  Temp:    97.8 F (36.6 C)  TempSrc:    Oral  SpO2:    96%  Weight: 103.4 kg     Height: 5\' 6"  (1.676 m)     PainSc:  5  3     Body mass index is 36.8 kg/m.  Recent laboratory studies:  CBC    Component Value Date/Time   WBC 12.8 (H) 02/14/2019 0949   RBC 4.38 02/14/2019 0949   HGB 12.2 02/14/2019 0949   HCT 40.4 02/14/2019 0949   PLT 332 02/14/2019 0949   MCV 92.2 02/14/2019 0949   MCH 27.9 02/14/2019 0949   MCHC 30.2 02/14/2019 0949   RDW 13.2 02/14/2019 0949   LYMPHSABS 1.8 12/24/2018 1925   MONOABS 0.8 12/24/2018 1925   EOSABS 0.1 12/24/2018 1925   BASOSABS 0.0 12/24/2018 1925   BMP  Latest Ref Rng & Units 02/14/2019 12/24/2018 10/31/2018  Glucose 70 - 99 mg/dL 101(H) 101(H) 130(H)  BUN 8 - 23 mg/dL 15 28(H) 21  Creatinine 0.44 - 1.00 mg/dL 0.83 1.01(H) 0.92  Sodium 135 - 145 mmol/L 139 138 141  Potassium 3.5 - 5.1 mmol/L 4.0 4.1 4.5  Chloride 98 - 111 mmol/L 106 105 108  CO2 22 - 32 mmol/L 22 23 24   Calcium 8.9 - 10.3 mg/dL 9.3 9.0 9.3   Urinalysis    Component Value Date/Time   COLORURINE YELLOW 12/24/2018 [place] 12/24/2018 2215   LABSPEC >1.046 (H) 12/24/2018 2215   PHURINE 6.0 12/24/2018 2215   [place] 12/24/2018 2215   [place] 12/24/2018 2215   [place] 12/24/2018 2215   [place] 12/24/2018 2215   PROTEINUR  NEGATIVE 12/24/2018 2215   UROBILINOGEN 0.2 09/22/2007 1157   NITRITE NEGATIVE 12/24/2018 2215   LEUKOCYTESUR LARGE (A) 12/24/2018 2215     Discharge Medications:   Allergies as of 02/15/2019      Reactions   Codeine Nausea Only, Other (See Comments)   Headache   Sulfonamide Derivatives Nausea Only, Other (See Comments)   Headache   Esomeprazole Magnesium Nausea Only   Fluconazole Rash   Redness and blistering on left thigh      Medication List    STOP taking these medications   cephALEXin 500 MG capsule Commonly known as:  KEFLEX   HYDROcodone-acetaminophen 5-325 MG tablet Commonly known as:  NORCO/VICODIN   promethazine 12.5 MG tablet Commonly known as:  PHENERGAN     TAKE these medications   CENTRUM SILVER PO Take 1 capsule by mouth daily.   cetirizine 10 MG tablet Commonly known as:  ZYRTEC Take 10 mg by mouth daily.   Cholecalciferol 50 MCG (2000 UT) Caps Take 1 capsule (2,000 Units total) by mouth daily with breakfast.   escitalopram 20 MG tablet Commonly known as:  LEXAPRO TAKE 1 AND 1/2 TABLET DAILY What changed:    how much to take  how to take this  when to take this  additional instructions   lactose free nutrition Liqd Take 237 mLs by mouth daily.   levothyroxine 125 MCG tablet Commonly known as:  SYNTHROID, LEVOTHROID Take 1 tablet (125 mcg total) by mouth daily before breakfast.   nilotinib 150 MG capsule Commonly known as:  TASIGNA Take 2 capsules (300 mg total) by mouth every 12 (twelve) hours.   ondansetron 4 MG tablet Commonly known as:  Zofran Take 1 tablet (4 mg total) by mouth every 8 (eight) hours as needed for nausea or vomiting.   Probiotic Caps Take 2 capsules by mouth daily.   vitamin B-12 1000 MCG tablet Commonly known as:  CYANOCOBALAMIN Take 1,000 mcg by mouth daily.   vitamin E 400 UNIT capsule Take 800 Units by mouth daily.     ASK your doctor about these medications   HYDROcodone-acetaminophen 5-325 MG  tablet Commonly known as:  Norco Take 1 tablet by mouth every 6 (six) hours as needed for up to 5 days. Ask about: Should I take this medication?   methocarbamol 500 MG tablet Commonly known as:  Robaxin Take 1 tablet (500 mg total) by mouth every 8 (eight) hours as needed for up to 5 days for muscle spasms. Ask about: Should I take this medication?       Diagnostic Studies: Dg Chest 2 View  Result Date: 02/14/2019 CLINICAL DATA:  Preop EXAM: CHEST - 2  VIEW COMPARISON:  12/24/2018 FINDINGS: Linear scarring in the lung bases. Mild hyperinflation. Heart is normal size. No effusions or acute bony abnormality. IMPRESSION: Hyperinflation.  Bibasilar scarring.  No active disease. Electronically Signed   By: Rolm Baptise M.D.   On: 02/14/2019 10:23   Dg Thoracolumabar Spine  Result Date: 02/14/2019 CLINICAL DATA:  Kyphoplasty at T12-L1 EXAM: THORACOLUMBAR SPINE 1V COMPARISON:  Chest radiographs 02/14/2019 FLUOROSCOPY TIME:  3 minutes 52 seconds Images submitted: 3 FINDINGS: Interval spinal augmentation procedures at T12 and L1. Both vertebra demonstrate mild anterior height losses, improved at L1 versus prior exam. Diffuse osseous demineralization. IMPRESSION: Prior spinal augmentation procedures at T12 and L1 as above. Electronically Signed   By: Lavonia Dana M.D.   On: 02/14/2019 16:27   Dg C-arm 1-60 Min  Result Date: 02/14/2019 CLINICAL DATA:  Kyphoplasty at T12-L1 EXAM: THORACOLUMBAR SPINE 1V COMPARISON:  Chest radiographs 02/14/2019 FLUOROSCOPY TIME:  3 minutes 52 seconds Images submitted: 3 FINDINGS: Interval spinal augmentation procedures at T12 and L1. Both vertebra demonstrate mild anterior height losses, improved at L1 versus prior exam. Diffuse osseous demineralization. IMPRESSION: Prior spinal augmentation procedures at T12 and L1 as above. Electronically Signed   By: Lavonia Dana M.D.   On: 02/14/2019 16:27    Discharge Instructions    Incentive spirometry RT   Complete by:  As  directed       Follow-up [facility], [place], MD In 2 weeks.   Specialty:  Orthopedic Surgery Why:  If symptoms worsen, For suture removal, For wound re-check Contact information: [address] [place] [address] [phone]           Discharge Plan:  discharge to home  Disposition: stable    Signed: Yvonne Kendall Ward for [facility] PA-C [facility] [phone] 02/27/2019, 3:07 PM

## 2019-04-13 DIAGNOSIS — Z4889 Encounter for other specified surgical aftercare: Secondary | ICD-10-CM | POA: Diagnosis not present

## 2019-04-13 DIAGNOSIS — M25561 Pain in right knee: Secondary | ICD-10-CM | POA: Diagnosis not present

## 2019-04-13 DIAGNOSIS — M25562 Pain in left knee: Secondary | ICD-10-CM | POA: Diagnosis not present

## 2019-04-13 DIAGNOSIS — M179 Osteoarthritis of knee, unspecified: Secondary | ICD-10-CM | POA: Diagnosis not present

## 2019-04-13 DIAGNOSIS — M170 Bilateral primary osteoarthritis of knee: Secondary | ICD-10-CM | POA: Diagnosis not present

## 2019-04-18 ENCOUNTER — Telehealth (HOSPITAL_COMMUNITY): Payer: Self-pay | Admitting: *Deleted

## 2019-04-19 ENCOUNTER — Other Ambulatory Visit: Payer: Self-pay | Admitting: "Endocrinology

## 2019-04-23 ENCOUNTER — Telehealth (HOSPITAL_COMMUNITY): Payer: Self-pay | Admitting: Hematology

## 2019-04-23 ENCOUNTER — Other Ambulatory Visit (HOSPITAL_COMMUNITY): Payer: Self-pay | Admitting: *Deleted

## 2019-04-23 DIAGNOSIS — C9210 Chronic myeloid leukemia, BCR/ABL-positive, not having achieved remission: Secondary | ICD-10-CM

## 2019-04-23 MED ORDER — NILOTINIB HCL 150 MG PO CAPS: 300.0000 mg | ORAL_CAPSULE | Freq: Two times a day (BID) | ORAL | 6 refills | Status: DC

## 2019-04-23 NOTE — Telephone Encounter (Signed)
PT CALLED LAST WEEK REQUESTING A SCRIPT FOR 6 REFILLS ON HER TASIGNA TO BE SENT TO [facility] BY [facility].   PC TO [facility] WITH [facility]. HE STATED THERE IS NO NEED TO SEND IN NEW SCRIPT BECAUSE EVERY TIME WE SEND IN A NEW ONE IT CANCELS OUT THE EXISTING ONE. WAIT UNTIL OTHER 4 REFILLS ARE USED AND THEN SEND IN NEW SCRIPT.  WILL CONTACT PT AND LET HER KNOW

## 2019-04-24 ENCOUNTER — Other Ambulatory Visit: Payer: Self-pay

## 2019-04-24 ENCOUNTER — Inpatient Hospital Stay (HOSPITAL_COMMUNITY): Payer: Medicare Other | Attending: Hematology

## 2019-04-24 DIAGNOSIS — Z79899 Other long term (current) drug therapy: Secondary | ICD-10-CM | POA: Insufficient documentation

## 2019-04-24 DIAGNOSIS — C9210 Chronic myeloid leukemia, BCR/ABL-positive, not having achieved remission: Secondary | ICD-10-CM | POA: Diagnosis not present

## 2019-04-24 DIAGNOSIS — E039 Hypothyroidism, unspecified: Secondary | ICD-10-CM | POA: Insufficient documentation

## 2019-04-24 DIAGNOSIS — Z131 Encounter for screening for diabetes mellitus: Secondary | ICD-10-CM

## 2019-04-24 LAB — HEMOGLOBIN A1C
Hgb A1c MFr Bld: 5.3 % (ref 4.8–5.6)
Mean Plasma Glucose: 105.41 mg/dL

## 2019-04-24 LAB — CBC WITH DIFFERENTIAL/PLATELET
Abs Immature Granulocytes: 0.16 10*3/uL — ABNORMAL HIGH (ref 0.00–0.07)
Basophils Absolute: 0 10*3/uL (ref 0.0–0.1)
Basophils Relative: 0 %
Eosinophils Absolute: 0.1 10*3/uL (ref 0.0–0.5)
Eosinophils Relative: 1 %
HCT: 40.8 % (ref 36.0–46.0)
Hemoglobin: 12.8 g/dL (ref 12.0–15.0)
Immature Granulocytes: 1 %
Lymphocytes Relative: 11 %
Lymphs Abs: 1.9 10*3/uL (ref 0.7–4.0)
MCH: 28.2 pg (ref 26.0–34.0)
MCHC: 31.4 g/dL (ref 30.0–36.0)
MCV: 89.9 fL (ref 80.0–100.0)
Monocytes Absolute: 1.2 10*3/uL — ABNORMAL HIGH (ref 0.1–1.0)
Monocytes Relative: 7 %
Neutro Abs: 13.7 10*3/uL — ABNORMAL HIGH (ref 1.7–7.7)
Neutrophils Relative %: 80 %
Platelets: 300 10*3/uL (ref 150–400)
RBC: 4.54 MIL/uL (ref 3.87–5.11)
RDW: 13.6 % (ref 11.5–15.5)
WBC: 17.1 10*3/uL — ABNORMAL HIGH (ref 4.0–10.5)
nRBC: 0 % (ref 0.0–0.2)

## 2019-04-24 LAB — COMPREHENSIVE METABOLIC PANEL
ALT: 16 U/L (ref 0–44)
AST: 20 U/L (ref 15–41)
Albumin: 3.9 g/dL (ref 3.5–5.0)
Alkaline Phosphatase: 174 U/L — ABNORMAL HIGH (ref 38–126)
Anion gap: 10 (ref 5–15)
BUN: 29 mg/dL — ABNORMAL HIGH (ref 8–23)
CO2: 24 mmol/L (ref 22–32)
Calcium: 9.1 mg/dL (ref 8.9–10.3)
Chloride: 105 mmol/L (ref 98–111)
Creatinine, Ser: 0.87 mg/dL (ref 0.44–1.00)
GFR calc Af Amer: >60 mL/min (ref >60)
GFR calc non Af Amer: >60 mL/min (ref >60)
Glucose, Bld: 119 mg/dL — ABNORMAL HIGH (ref 70–99)
Potassium: 4.3 mmol/L (ref 3.5–5.1)
Sodium: 139 mmol/L (ref 135–145)
Total Bilirubin: 0.6 mg/dL (ref 0.3–1.2)
Total Protein: 7.9 g/dL (ref 6.5–8.1)

## 2019-04-24 LAB — LACTATE DEHYDROGENASE: LDH: 121 U/L (ref 98–192)

## 2019-04-30 ENCOUNTER — Other Ambulatory Visit (HOSPITAL_COMMUNITY): Payer: Self-pay | Admitting: Pulmonary Disease

## 2019-04-30 DIAGNOSIS — Z780 Asymptomatic menopausal state: Secondary | ICD-10-CM

## 2019-04-30 LAB — BCR-ABL1, CML/ALL, PCR, QUANT

## 2019-05-08 ENCOUNTER — Inpatient Hospital Stay (HOSPITAL_BASED_OUTPATIENT_CLINIC_OR_DEPARTMENT_OTHER): Payer: Medicare Other | Admitting: Hematology

## 2019-05-08 ENCOUNTER — Encounter (HOSPITAL_COMMUNITY): Payer: Self-pay | Admitting: Hematology

## 2019-05-08 ENCOUNTER — Ambulatory Visit (HOSPITAL_COMMUNITY)
Admission: RE | Admit: 2019-05-08 | Discharge: 2019-05-08 | Disposition: A | Discharge status: Home / Self Care | Payer: Medicare Other | Source: Ambulatory Visit | Attending: Pulmonary Disease | Admitting: Pulmonary Disease

## 2019-05-08 ENCOUNTER — Other Ambulatory Visit: Payer: Self-pay

## 2019-05-08 DIAGNOSIS — Z780 Asymptomatic menopausal state: Secondary | ICD-10-CM | POA: Insufficient documentation

## 2019-05-08 DIAGNOSIS — C9210 Chronic myeloid leukemia, BCR/ABL-positive, not having achieved remission: Secondary | ICD-10-CM

## 2019-05-08 DIAGNOSIS — E039 Hypothyroidism, unspecified: Secondary | ICD-10-CM

## 2019-05-08 DIAGNOSIS — Z79899 Other long term (current) drug therapy: Secondary | ICD-10-CM | POA: Diagnosis not present

## 2019-05-08 LAB — HM DEXA SCAN: HM Dexa Scan: NORMAL

## 2019-05-08 NOTE — Assessment & Plan Note (Addendum)
1.  CML in chronic phase: - Nilotinib started on 01/02/2016 - She developed left pleural and pericardial effusion, requiring pericardial window on 10/25/2016, fluid cytology negative. - She was started back on Nilotinib 300 mg every 12 hours on 12/06/2016. - I reviewed her blood work.  For the last 1 year her white count has been high.  However BCR/ABL by quantitative PCR is undetectable on 04/24/2019 as well as on 10/31/2018. - Chest x-ray on 02/14/2019 did not show any effusions.  CT of the abdomen and pelvis on 12/24/2018 shows normal-sized spleen. -She reports daytime sweats for the last couple of years.  About 2 episodes per day with heavy sweating.  Denies any significant night sweats. -I will see her back in 3 months for follow-up with repeat BCR/ABL by quantitative PCR.  2.  Health maintenance: -DEXA scan on 05/08/2019 shows T score of -1, which is normal.  3.  Elevated alkaline phosphatase: - She had a ultrasound of the liver done in January 2019 which showed hepatic steatosis.

## 2019-05-08 NOTE — Progress Notes (Signed)
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 Florene Glen [facility]    11/21/2015 -  Chemotherapy    Hydrea 1000 mg and Allopurinol 300 mg daily (due to high uric acid level)    11/21/2015 Tumor Marker    BCR P210 Ratio of Fusion to Control, Blood 0.8941    P210 IS % Ratio, Blood 85.834%      01/02/2016 - 10/23/2016 Chemotherapy    Tasigna    01/08/2016 Miscellaneous    EKG- QT/QTc: 382/440 ms    01/16/2016 Miscellaneous    EKG- QT/QTc: 364/419    03/30/2016 Tumor Marker    b2a2 transcript: 0.317 b3a2 transcript: 0.194    10/23/2016 - 11/05/2016 Hospital Admission    large Left Pleural Effusion and Pericardial Effusion that is now s/p VATS with Drainage, and Drainage of Pericardial Fluid with Window and Biopsy of Pericardium    12/06/2016 -  Chemotherapy    Tasigna restarted     05/24/2017 Adverse Reaction    Prolonged QTc on EKG; likely d/t chemo. Tasigna ON HOLD until next visit.     06/10/2017 -  Chemotherapy    Resumed Tasigna    07/13/2017 Miscellaneous    Evaluated by Cardiology for prolonged QTc. Per Dr. Bronson Ing, patient's QT intervals are not prolonged to the point of potentially promoting a ventricular arrhythmia. He thinks she can continue on current medical therapy for her CML with serial ECG monitoring.    12/27/2017 Miscellaneous    EKG: QT/QTc 398/435       INTERVAL HISTORY:  Andrea Martinez 74 y.o. female presents today for follow up. She reports overall doing well. She does report drenching day sweats that began several months ago. The day sweats occur  2-3 x per day, 7 days a week. She reports night sweats have actually improved and only occur 2-3 times per week and are not drenching. Denies any fevers or recurrent infections. No lymphadenopathy. No weight change.  Energy levels are stable. Overall, she is doing well.  She is having some problems with bilateral knee arthritis.  Otherwise she reports her appetite at 25% and energy levels at 100%.  She does have chronic stable leg swellings.   REVIEW OF SYSTEMS:  Review of Systems  Constitutional: Positive for fatigue.       Day sweats, night sweats  HENT:  Negative.   Eyes: Negative.   Respiratory: Negative.   Cardiovascular: Positive for leg swelling.  Gastrointestinal: Negative.   Endocrine: Positive for hot flashes.  Genitourinary: Negative.    Musculoskeletal: Positive for arthralgias.  Skin: Negative.   Neurological: Positive for extremity weakness.  Hematological: Negative.   Psychiatric/Behavioral: Negative.      PAST MEDICAL/SURGICAL HISTORY:  Past Medical History:  Diagnosis Date  . Arthritis   . 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 Gastric ulcer 2011   EGD, 5/11  . Hypothyroidism    not on meds, followed by Dr. Elyse Hsu  . Leukocytosis 11/20/2015  . Lipoma    left upper arm  . Pericardial effusion    a. HCAP complicated  by pericardial effusion requiring pericardial window 10/2016 and large L pleural effusion requring VATS.  Marland Kitchen Pleural effusion    a. s/p VATS 2017.  Marland Kitchen Pneumonia 10/2016  . PONV (postoperative nausea and vomiting)   . Prolonged QT interval   . Urine incontinence   . UTI (lower urinary tract infection)    frequent   Past Surgical History:  Procedure Laterality Date  . BRAVO [place] STUDY  12/04/2012   Procedure: BRAVO [place];  Surgeon: [name], MD;  [facility];  Service: Endoscopy;  Laterality: N/A;  . CHOLECYSTECTOMY N/A 01/03/2014   Procedure: LAPAROSCOPIC CHOLECYSTECTOMY WITH INTRAOPERATIVE  CHOLANGIOGRAM;  Surgeon: Joyice Faster. Cornett, MD;  Location: [facility];  Service: General;  Laterality: N/A;  . COLONOSCOPY  01/2004   DR [facility], few small tics  . ESOPHAGOGASTRODUODENOSCOPY  02/2010   gastric ulcers  . ESOPHAGOGASTRODUODENOSCOPY  12/04/2012   ZOX:[id number] gastritis (inflammation) was found in the gastric antrum; multiple biopsies The duodenal mucosa showed no abnormalities in the bulb and second portion of the duodenum  . KYPHOPLASTY N/A 02/14/2019   Procedure: KYPHOPLASTY T12 and L1;  Surgeon: Melina Schools, MD;  Location: [facility];  Service: Orthopedics;  Laterality: N/A;  120 mins  . LIPOMA EXCISION  08/02/2011   left shoulder  . NOSE SURGERY    . PARTIAL HYSTERECTOMY     vaginal at age 44 years of age  . TOE DEBRIDEMENT Left 1962   lt great toe  . VIDEO ASSISTED THORACOSCOPY (VATS)/EMPYEMA Left 10/25/2016   Procedure: VIDEO ASSISTED THORACOSCOPY (VATS), BRONCH,DRAINAGE OF PLEURAL EFFUSION,PERICARDIAL WINDOW WITH DRAINAGE OF PERICARDIAL FLUID, TEE;  Surgeon: [name], MD;  Location: [facility];  Service: Thoracic;  Laterality: Left;  Marland Kitchen VIDEO BRONCHOSCOPY N/A 10/25/2016   Procedure: VIDEO BRONCHOSCOPY;  Surgeon: [name], MD;  Location: [facility];  Service: Thoracic;  Laterality: N/A;     SOCIAL HISTORY:  Social History   Socioeconomic History  . Marital status: Married    Spouse name: Not on file  . Number of children: 2  . Years of education: 12th grade  . Highest education level: Not on file  Occupational History  . Occupation: retired    Fish farm manager: UENPLOYED    Employer: RETIRED  Social Needs  . Financial resource strain: Not on file  . Food insecurity:    Worry: Not on file    Inability: Not on file  . Transportation needs:    Medical: Not on file    Non-medical: Not on file  Tobacco Use  . Smoking status: Never Smoker  . Smokeless tobacco: Never Used  Substance and Sexual Activity  . Alcohol use: No    Alcohol/week: 0.0  standard drinks  . Drug use: No  . Sexual activity: Yes    Partners: Male    Birth control/protection: None  Lifestyle  . Physical activity:    Days per week: Not on file    Minutes per session: Not on file  . Stress: Not on file  Relationships  . Social connections:    Talks on phone: Not on file    Gets together: Not on file    Attends religious service: Not on file    Active member of club or organization: Not on file    Attends meetings of clubs or organizations: Not on file    Relationship status: Not on file  . Intimate partner violence:    Fear of current or ex partner: Not on  file    Emotionally abused: Not on file    Physically abused: Not on file    Forced sexual activity: Not on file  Other Topics Concern  . Not on file  Social History Narrative  . Not on file    FAMILY HISTORY:  Family History  Problem Relation Age of Onset  . Lung cancer Father   . COPD Mother   . Colon cancer Neg Hx   . Anesthesia problems Neg Hx   . Hypotension Neg Hx   . Malignant hyperthermia Neg Hx   . Pseudochol deficiency Neg Hx     CURRENT MEDICATIONS:  Outpatient Encounter Medications as of 05/08/2019  Medication Sig  . cetirizine (ZYRTEC) 10 MG tablet Take 10 mg by mouth daily.   . Cholecalciferol (VITAMIN D) 50 MCG (2000 UT) tablet TAKE 1 CAPSULE (2,000 UNITS TOTAL) BY MOUTH DAILY WITH BREAKFAST.  Marland Kitchen escitalopram (LEXAPRO) 20 MG tablet TAKE 1 AND 1/2 TABLET DAILY (Patient taking differently: Take 30 mg by mouth daily. )  . lactose free nutrition (BOOST) LIQD Take 237 mLs by mouth daily.  Marland Kitchen levothyroxine (SYNTHROID, LEVOTHROID) 125 MCG tablet Take 1 tablet (125 mcg total) by mouth daily before breakfast.  . Multiple Vitamins-Minerals (CENTRUM SILVER PO) Take 1 capsule by mouth daily.  . nilotinib (TASIGNA) 150 MG capsule Take 2 capsules (300 mg total) by mouth every 12 (twelve) hours.  . Probiotic CAPS Take 2 capsules by mouth daily.  . vitamin B-12 (CYANOCOBALAMIN) 1000 MCG  tablet Take 1,000 mcg by mouth daily.   . vitamin E 400 UNIT capsule Take 800 Units by mouth daily.   . ondansetron (ZOFRAN) 4 MG tablet Take 1 tablet (4 mg total) by mouth every 8 (eight) hours as needed for nausea or vomiting. (Patient not taking: Reported on 05/08/2019)   No facility-administered encounter medications on file as of 05/08/2019.     ALLERGIES:  Allergies  Allergen Reactions  . Codeine Nausea Only and Other (See Comments)    Headache  . Sulfonamide Derivatives Nausea Only and Other (See Comments)    Headache  . Esomeprazole Magnesium Nausea Only  . Fluconazole Rash    Redness and blistering on left thigh     PHYSICAL EXAM:  ECOG Performance status: 2   Vitals:   05/08/19 1000  BP: 128/82  Pulse: 73  Resp: 16  Temp: 98.2 F (36.8 C)  SpO2: 97%   Filed Weights   05/08/19 1000  Weight: 222 lb (100.7 kg)    Physical Exam Constitutional:      Appearance: Normal appearance. She is obese.  HENT:     Head: Normocephalic.     Nose: Nose normal.     Mouth/Throat:     Mouth: Mucous membranes are moist.     Pharynx: Oropharynx is clear.  Eyes:     Pupils: Pupils are equal, round, and reactive to light.  Neck:     Musculoskeletal: Normal range of motion.  Cardiovascular:     Rate and Rhythm: Normal rate and regular rhythm.  Pulmonary:     Effort: Pulmonary effort is normal.     Breath sounds: Normal breath sounds.  Abdominal:     General: Bowel sounds are normal.     Palpations: Abdomen is soft.  Musculoskeletal:     Right lower leg: Edema present.     Left lower leg: Edema present.  Skin:    General: Skin is warm and dry.  Neurological:     General: No  focal deficit present.     Mental Status: She is oriented to person, place, and time. Mental status is at baseline.  Psychiatric:        Mood and Affect: Mood normal.        Behavior: Behavior normal.        Thought Content: Thought content normal.        Judgment: Judgment normal.       LABORATORY DATA:  I have reviewed the labs as listed.  CBC    Component Value Date/Time   WBC 17.1 (H) 04/24/2019 1146   RBC 4.54 04/24/2019 1146   HGB 12.8 04/24/2019 1146   HCT 40.8 04/24/2019 1146   PLT 300 04/24/2019 1146   MCV 89.9 04/24/2019 1146   MCH 28.2 04/24/2019 1146   MCHC 31.4 04/24/2019 1146   RDW 13.6 04/24/2019 1146   LYMPHSABS 1.9 04/24/2019 1146   MONOABS 1.2 (H) 04/24/2019 1146   EOSABS 0.1 04/24/2019 1146   BASOSABS 0.0 04/24/2019 1146   CMP Latest Ref Rng & Units 04/24/2019 02/14/2019 12/24/2018  Glucose 70 - 99 mg/dL 119(H) 101(H) 101(H)  BUN 8 - 23 mg/dL 29(H) 15 28(H)  Creatinine 0.44 - 1.00 mg/dL 0.87 0.83 1.01(H)  Sodium 135 - 145 mmol/L 139 139 138  Potassium 3.5 - 5.1 mmol/L 4.3 4.0 4.1  Chloride 98 - 111 mmol/L 105 106 105  CO2 22 - 32 mmol/L 24 22 23   Calcium 8.9 - 10.3 mg/dL 9.1 9.3 9.0  Total Protein 6.5 - 8.1 g/dL 7.9 - 7.8  Total Bilirubin 0.3 - 1.2 mg/dL 0.6 - 0.7  Alkaline Phos 38 - 126 U/L 174(H) - 136(H)  AST 15 - 41 U/L 20 - 27  ALT 0 - 44 U/L 16 - 24       DIAGNOSTIC IMAGING:  I have independently reviewed the scans and discussed with the patient.   I have reviewed Carver Fila, NP's note and agree with the documentation.  I personally performed a face-to-face visit, made revisions and my assessment and plan is as follows.    ASSESSMENT & PLAN:   CML (chronic myeloid leukemia) ([place]) 1.  CML in chronic phase: - Nilotinib started on 01/02/2016 - She developed left pleural and pericardial effusion, requiring pericardial window on 10/25/2016, fluid cytology negative. - She was started back on Nilotinib 300 mg every 12 hours on 12/06/2016. - I reviewed her blood work.  For the last 1 year her white count has been high.  However BCR/ABL by quantitative PCR is undetectable on 04/24/2019 as well as on 10/31/2018. - Chest x-ray on 02/14/2019 did not show any effusions.  CT of the abdomen and pelvis on 12/24/2018 shows normal-sized spleen.  -She reports daytime sweats for the last couple of years.  About 2 episodes per day with heavy sweating.  Denies any significant night sweats. -I will see her back in 3 months for follow-up with repeat BCR/ABL by quantitative PCR.  2.  Health maintenance: -DEXA scan on 05/08/2019 shows T score of -1, which is normal.  3.  Elevated alkaline phosphatase: - She had a ultrasound of the liver done in January 2019 which showed hepatic steatosis.  Total time spent is 25 minutes with more than 50% of the time spent face-to-face discussing her disease state, treatment plan and coordination of care.    Orders placed this encounter:  Orders Placed This Encounter  Procedures  . CBC with Differential  . Comprehensive metabolic panel  . BCR-ABL1, CML/ALL, PCR, QUANT  Derek Jack, MD [place] [phone]

## 2019-05-10 ENCOUNTER — Telehealth (HOSPITAL_COMMUNITY): Payer: Self-pay | Admitting: Surgery

## 2019-05-10 NOTE — Telephone Encounter (Signed)
Pt had called complaining of heavy sweating during the day and at night, and she also wanted to make sure that we had Escitalopram on her med list and to give her some more refills if possible.  I sent a message to Caryl Pina and Amy has already called the pt to get her an earlier appointment to address the sweating.  Also, we did have Escitalopram on her med list and Caryl Pina will be giving the pt more refills.  Called the pt to let her know and she verbalized understanding and was told to call back if she had any more problems.

## 2019-05-17 ENCOUNTER — Telehealth: Payer: Self-pay | Admitting: Cardiovascular Disease

## 2019-05-17 NOTE — Telephone Encounter (Addendum)
I called patient to make ekg apt, she said she was calling to ask if she really needed to have an ekg.She says she will see her oncologist and if they want one ,she will call.She doesn't even want a phone apt.Says she is doing great.

## 2019-05-17 NOTE — Telephone Encounter (Signed)
Patient has appointment on 05/21/2019 . She is concerned about having an EKG .

## 2019-05-17 NOTE — Telephone Encounter (Signed)
Apt is being changed to VV, patient wants an EKG   Shall I bring her in as a nurse visit for ekg?

## 2019-05-17 NOTE — Telephone Encounter (Signed)
yes

## 2019-05-17 NOTE — Telephone Encounter (Signed)
We will call pt with nurse apt

## 2019-05-21 ENCOUNTER — Ambulatory Visit: Payer: Medicare Other | Admitting: Cardiovascular Disease

## 2019-05-22 DIAGNOSIS — M170 Bilateral primary osteoarthritis of knee: Secondary | ICD-10-CM | POA: Diagnosis not present

## 2019-05-22 DIAGNOSIS — M25561 Pain in right knee: Secondary | ICD-10-CM | POA: Diagnosis not present

## 2019-05-22 DIAGNOSIS — M1711 Unilateral primary osteoarthritis, right knee: Secondary | ICD-10-CM | POA: Diagnosis not present

## 2019-05-22 DIAGNOSIS — M25562 Pain in left knee: Secondary | ICD-10-CM | POA: Diagnosis not present

## 2019-05-23 ENCOUNTER — Ambulatory Visit: Payer: Medicare Other

## 2019-05-29 DIAGNOSIS — M170 Bilateral primary osteoarthritis of knee: Secondary | ICD-10-CM | POA: Diagnosis not present

## 2019-05-31 ENCOUNTER — Inpatient Hospital Stay (HOSPITAL_COMMUNITY): Payer: Medicare Other | Attending: Hematology | Admitting: Hematology

## 2019-05-31 ENCOUNTER — Encounter (HOSPITAL_COMMUNITY): Payer: Self-pay | Admitting: Hematology

## 2019-05-31 ENCOUNTER — Other Ambulatory Visit: Payer: Self-pay

## 2019-05-31 VITALS — BP 131/64 | HR 79 | Temp 98.6°F | Resp 18 | Wt 225.7 lb

## 2019-05-31 DIAGNOSIS — C9210 Chronic myeloid leukemia, BCR/ABL-positive, not having achieved remission: Secondary | ICD-10-CM | POA: Diagnosis not present

## 2019-05-31 DIAGNOSIS — Z9221 Personal history of antineoplastic chemotherapy: Secondary | ICD-10-CM | POA: Insufficient documentation

## 2019-05-31 DIAGNOSIS — E039 Hypothyroidism, unspecified: Secondary | ICD-10-CM

## 2019-05-31 DIAGNOSIS — R748 Abnormal levels of other serum enzymes: Secondary | ICD-10-CM | POA: Diagnosis not present

## 2019-05-31 DIAGNOSIS — Z79899 Other long term (current) drug therapy: Secondary | ICD-10-CM | POA: Diagnosis not present

## 2019-05-31 NOTE — Progress Notes (Signed)
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 Florene [facility]   11/21/2015 -  Chemotherapy   Hydrea 1000 mg and Allopurinol 300 mg daily (due to high uric acid level)   11/21/2015 Tumor Marker   BCR P210 Ratio of Fusion to Control, Blood 0.8941    P210 IS % Ratio, Blood 85.834%     01/02/2016 - 10/23/2016 Chemotherapy   Tasigna   01/08/2016 Miscellaneous   EKG- QT/QTc: 382/440 ms   01/16/2016 Miscellaneous   EKG- QT/QTc: 364/419   03/30/2016 Tumor Marker   b2a2 transcript: 0.317 b3a2 transcript: 0.194   10/23/2016 - 11/05/2016 Hospital Admission   large Left Pleural Effusion and Pericardial Effusion that is now s/p VATS with Drainage, and Drainage of Pericardial Fluid with Window and Biopsy of Pericardium   12/06/2016 -  Chemotherapy   Tasigna restarted    05/24/2017 Adverse Reaction   Prolonged QTc on EKG; likely d/t chemo. Tasigna ON HOLD until next visit.    06/10/2017 -  Chemotherapy   Resumed Tasigna   07/13/2017 Miscellaneous   Evaluated by Cardiology for prolonged QTc. Per Dr. Bronson Ing, patient's QT intervals are not prolonged to the point of potentially promoting a ventricular arrhythmia. He thinks she can continue on current medical therapy for her CML with serial ECG monitoring.   12/27/2017 Miscellaneous   EKG: QT/QTc 398/435       INTERVAL HISTORY:  Andrea Martinez 73 y.o. female presents for follow-up of CML.  She reports having sweating about 2-3 times per day for the past 2 years.  She reports sudden onset of sweating on the face and back  of the head.  Sometimes she has to change her T-shirt.  She is also having sleep problems because of it.  Appetite is 100%.  Energy levels are 25%.  Denies any new onset coughs or expectoration.  Denies any ER visits or hospitalizations.  Denies any new onset medications.   REVIEW OF SYSTEMS:  Review of Systems  Constitutional: Negative for fatigue.       Day sweats, night sweats  HENT:  Negative.   Eyes: Negative.   Respiratory: Negative.   Cardiovascular: Positive for leg swelling.  Gastrointestinal: Negative.   Endocrine: Negative for hot flashes.  Genitourinary: Negative.    Skin: Negative.   Hematological: Negative.   Psychiatric/Behavioral: Positive for sleep disturbance.  All other systems reviewed and are negative.    PAST MEDICAL/SURGICAL HISTORY:  Past Medical History:  Diagnosis Date  . Arthritis   . 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 Gastric ulcer 2011   EGD, 5/11  . Hypothyroidism    not on meds, followed by Dr. Elyse Hsu  . Leukocytosis 11/20/2015  . Lipoma    left upper arm  . Pericardial effusion    a. HCAP complicated by pericardial effusion requiring pericardial window 10/2016 and large L pleural effusion requring VATS.  Marland Kitchen Pleural effusion    a. s/p VATS 2017.  Marland Kitchen Pneumonia 10/2016  . PONV (postoperative nausea and vomiting)   . Prolonged QT interval   . Urine incontinence   .  UTI (lower urinary tract infection)    frequent   Past Surgical History:  Procedure Laterality Date  . BRAVO [place] STUDY  12/04/2012   Procedure: BRAVO [place];  Surgeon: [name], MD;  [facility];  Service: Endoscopy;  Laterality: N/A;  . CHOLECYSTECTOMY N/A 01/03/2014   Procedure: LAPAROSCOPIC CHOLECYSTECTOMY WITH INTRAOPERATIVE CHOLANGIOGRAM;  Surgeon: Joyice Faster. Cornett, MD;  Location: [facility];  Service: General;  Laterality: N/A;  . COLONOSCOPY  01/2004   DR [facility], few small tics  .  ESOPHAGOGASTRODUODENOSCOPY  02/2010   gastric ulcers  . ESOPHAGOGASTRODUODENOSCOPY  12/04/2012   UXL:KGMWNUU gastritis (inflammation) was found in the gastric antrum; multiple biopsies The duodenal mucosa showed no abnormalities in the bulb and second portion of the duodenum  . KYPHOPLASTY N/A 02/14/2019   Procedure: KYPHOPLASTY T12 and L1;  Surgeon: Melina Schools, MD;  Location: [facility];  Service: Orthopedics;  Laterality: N/A;  120 mins  . LIPOMA EXCISION  08/02/2011   left shoulder  . NOSE SURGERY    . PARTIAL HYSTERECTOMY     vaginal at age 25 years of age  . TOE DEBRIDEMENT Left 1962   lt great toe  . VIDEO ASSISTED THORACOSCOPY (VATS)/EMPYEMA Left 10/25/2016   Procedure: VIDEO ASSISTED THORACOSCOPY (VATS), BRONCH,DRAINAGE OF PLEURAL EFFUSION,PERICARDIAL WINDOW WITH DRAINAGE OF PERICARDIAL FLUID, TEE;  Surgeon: [name], MD;  Location: [facility];  Service: Thoracic;  Laterality: Left;  Marland Kitchen VIDEO BRONCHOSCOPY N/A 10/25/2016   Procedure: VIDEO BRONCHOSCOPY;  Surgeon: [name], MD;  Location: [facility];  Service: Thoracic;  Laterality: N/A;     SOCIAL HISTORY:  Social History   Socioeconomic History  . Marital status: Married    Spouse name: Not on file  . Number of children: 2  . Years of education: 12th grade  . Highest education level: Not on file  Occupational History  . Occupation: retired    Fish farm manager: UENPLOYED    Employer: RETIRED  Social Needs  . Financial resource strain: Not on file  . Food insecurity    Worry: Not on file    Inability: Not on file  . Transportation needs    Medical: Not on file    Non-medical: Not on file  Tobacco Use  . Smoking status: Never Smoker  . Smokeless tobacco: Never Used  Substance and Sexual Activity  . Alcohol use: No    Alcohol/week: 0.0 standard drinks  . Drug use: No  . Sexual activity: Yes    Partners: Male    Birth control/protection: None  Lifestyle  . Physical activity    Days per week: Not on file     Minutes per session: Not on file  . Stress: Not on file  Relationships  . Social Herbalist on phone: Not on file    Gets together: Not on file    Attends religious service: Not on file    Active member of club or organization: Not on file    Attends meetings of clubs or organizations: Not on file    Relationship status: Not on file  . Intimate partner violence    Fear of current or ex partner: Not on file    Emotionally abused: Not on file    Physically abused: Not on file    Forced sexual activity: Not on file  Other Topics Concern  . Not on file  Social History Narrative  . Not on file    FAMILY HISTORY:  Family History  Problem Relation Age of Onset  . Lung cancer Father   . COPD Mother   . Colon cancer Neg Hx   . Anesthesia problems Neg Hx   . Hypotension Neg Hx   . Malignant hyperthermia Neg Hx   . Pseudochol deficiency Neg Hx     CURRENT MEDICATIONS:  Outpatient Encounter Medications as of 05/31/2019  Medication Sig  . cetirizine (ZYRTEC) 10 MG tablet Take 10 mg by mouth daily.   . Cholecalciferol (VITAMIN D) 50 MCG (2000 UT) tablet TAKE 1 CAPSULE (2,000 UNITS TOTAL) BY MOUTH DAILY WITH BREAKFAST.  Marland Kitchen escitalopram (LEXAPRO) 20 MG tablet TAKE 1 AND 1/2 TABLET DAILY (Patient taking differently: Take 30 mg by mouth daily. )  . lactose free nutrition (BOOST) LIQD Take 237 mLs by mouth daily.  Marland Kitchen levothyroxine (SYNTHROID, LEVOTHROID) 125 MCG tablet Take 1 tablet (125 mcg total) by mouth daily before breakfast.  . Multiple Vitamins-Minerals (CENTRUM SILVER PO) Take 1 capsule by mouth daily.  . nilotinib (TASIGNA) 150 MG capsule Take 2 capsules (300 mg total) by mouth every 12 (twelve) hours.  . ondansetron (ZOFRAN) 4 MG tablet Take 1 tablet (4 mg total) by mouth every 8 (eight) hours as needed for nausea or vomiting.  . Probiotic CAPS Take 2 capsules by mouth daily.  . vitamin B-12 (CYANOCOBALAMIN) 1000 MCG tablet Take 1,000 mcg by mouth daily.   . vitamin E 400  UNIT capsule Take 800 Units by mouth daily.    No facility-administered encounter medications on file as of 05/31/2019.     ALLERGIES:  Allergies  Allergen Reactions  . Codeine Nausea Only and Other (See Comments)    Headache  . Sulfonamide Derivatives Nausea Only and Other (See Comments)    Headache  . Esomeprazole Magnesium Nausea Only  . Fluconazole Rash    Redness and blistering on left thigh     PHYSICAL EXAM:  ECOG Performance status: 2   Vitals:   05/31/19 1018  BP: 131/64  Pulse: 79  Resp: 18  Temp: 98.6 F (37 C)  SpO2: 96%   Filed Weights   05/31/19 1018  Weight: 225 lb 11.2 oz (102.4 kg)    Physical Exam Constitutional:      Appearance: Normal appearance. She is obese.  HENT:     Head: Normocephalic.     Nose: Nose normal.     Mouth/Throat:     Mouth: Mucous membranes are moist.     Pharynx: Oropharynx is clear.  Eyes:     Pupils: Pupils are equal, round, and reactive to light.  Neck:     Musculoskeletal: Normal range of motion.  Cardiovascular:     Rate and Rhythm: Normal rate and regular rhythm.  Pulmonary:     Effort: Pulmonary effort is normal.     Breath sounds: Normal breath sounds.  Abdominal:     General: Bowel sounds are normal.     Palpations: Abdomen is soft.  Musculoskeletal:     Right lower leg: Edema present.     Left lower leg: Edema present.  Skin:    General: Skin is warm and dry.  Neurological:     General: No focal deficit present.     Mental Status: She is oriented to person, place, and time. Mental status is at baseline.  Psychiatric:        Mood and Affect: Mood normal.        Behavior: Behavior normal.        Thought  Content: Thought content normal.        Judgment: Judgment normal.      LABORATORY DATA:  I have reviewed the labs as listed.  CBC    Component Value Date/Time   WBC 17.1 (H) 04/24/2019 1146   RBC 4.54 04/24/2019 1146   HGB 12.8 04/24/2019 1146   HCT 40.8 04/24/2019 1146   PLT 300  04/24/2019 1146   MCV 89.9 04/24/2019 1146   MCH 28.2 04/24/2019 1146   MCHC 31.4 04/24/2019 1146   RDW 13.6 04/24/2019 1146   LYMPHSABS 1.9 04/24/2019 1146   MONOABS 1.2 (H) 04/24/2019 1146   EOSABS 0.1 04/24/2019 1146   BASOSABS 0.0 04/24/2019 1146   CMP Latest Ref Rng & Units 04/24/2019 02/14/2019 12/24/2018  Glucose 70 - 99 mg/dL 119(H) 101(H) 101(H)  BUN 8 - 23 mg/dL 29(H) 15 28(H)  Creatinine 0.44 - 1.00 mg/dL 0.87 0.83 1.01(H)  Sodium 135 - 145 mmol/L 139 139 138  Potassium 3.5 - 5.1 mmol/L 4.3 4.0 4.1  Chloride 98 - 111 mmol/L 105 106 105  CO2 22 - 32 mmol/L 24 22 23   Calcium 8.9 - 10.3 mg/dL 9.1 9.3 9.0  Total Protein 6.5 - 8.1 g/dL 7.9 - 7.8  Total Bilirubin 0.3 - 1.2 mg/dL 0.6 - 0.7  Alkaline Phos 38 - 126 U/L 174(H) - 136(H)  AST 15 - 41 U/L 20 - 27  ALT 0 - 44 U/L 16 - 24       DIAGNOSTIC IMAGING:  I have independently reviewed the scans and discussed with the patient.    ASSESSMENT & PLAN:   CML (chronic myeloid leukemia) ([place]) 1.  CML in chronic phase: - Nilotinib started on 01/02/2016 - She developed left pleural and pericardial effusion, requiring pericardial window on 10/25/2016, fluid cytology negative. - She was started back on Nilotinib 300 mg every 12 hours on 12/06/2016. - I reviewed her blood work.  Her most recent CBC has increased to 17.  However BCR/ABL by quantitative PCR on 04/24/2019 was undetectable.  This was undetectable in November 2019 as well. -CT abdomen and pelvis on 12/16/2018 showed normal-sized spleen with no adenopathy. - She is reporting daytime sweating, predominantly on the face and back of the neck, at least 2 episodes per day for the past 2 years.  They are not getting better. - I have recommended doing a CT scan of the chest with contrast. - I have also recommended doing Jak 2 mutation testing to rule out myeloproliferative disorders because of her elevated white count which is progressively worsening. -I will see her back after  the above tests.  2.  Health maintenance: -DEXA scan on 05/08/2019 shows T score of -1 which is normal.  3.  Elevated alkaline phosphatase: - She had a ultrasound of the liver done in January 2019 which showed hepatic steatosis.  Total time spent is 25 minutes with more than 50% of the time spent face-to-face discussing her disease state, treatment plan and coordination of care.    Orders placed this encounter:  Orders Placed This Encounter  Procedures  . CT Chest W Contrast  . JAK2 V617F, w Reflex to CALR/E12/MPL  . CBC with Differential/Platelet  . Comprehensive metabolic panel      Derek Jack, MD [place] [phone]

## 2019-05-31 NOTE — Assessment & Plan Note (Signed)
1.  CML in chronic phase: - Nilotinib started on 01/02/2016 - She developed left pleural and pericardial effusion, requiring pericardial window on 10/25/2016, fluid cytology negative. - She was started back on Nilotinib 300 mg every 12 hours on 12/06/2016. - I reviewed her blood work.  Her most recent CBC has increased to 17.  However BCR/ABL by quantitative PCR on 04/24/2019 was undetectable.  This was undetectable in November 2019 as well. -CT abdomen and pelvis on 12/16/2018 showed normal-sized spleen with no adenopathy. - She is reporting daytime sweating, predominantly on the face and back of the neck, at least 2 episodes per day for the past 2 years.  They are not getting better. - I have recommended doing a CT scan of the chest with contrast. - I have also recommended doing Jak 2 mutation testing to rule out myeloproliferative disorders because of her elevated white count which is progressively worsening. -I will see her back after the above tests.  2.  Health maintenance: -DEXA scan on 05/08/2019 shows T score of -1 which is normal.  3.  Elevated alkaline phosphatase: - She had a ultrasound of the liver done in January 2019 which showed hepatic steatosis.

## 2019-05-31 NOTE — Patient Instructions (Signed)
[facility] at [facility] Discharge Instructions  You were seen today by Dr. Katragadda. He went over your recent lab results. He will see you back in 3 months for labs and follow up.   Thank you for choosing [facility] at [facility] to provide your oncology and hematology care.  To afford each patient quality time with our provider, please arrive at least 15 minutes before your scheduled appointment time.   If you have a lab appointment with the Cancer Center please come in thru the  Main Entrance and check in at the main information desk  You need to re-schedule your appointment should you arrive 10 or more minutes late.  We strive to give you quality time with our providers, and arriving late affects you and other patients whose appointments are after yours.  Also, if you no show three or more times for appointments you may be dismissed from the clinic at the providers discretion.     Again, thank you for choosing [facility].  Our hope is that these requests will decrease the amount of time that you wait before being seen by our physicians.       _____________________________________________________________  Should you have questions after your visit to [facility], please contact our office at [phone] between the hours of 8:00 a.m. and 4:30 p.m.  Voicemails left after 4:00 p.m. will not be returned until the following business day.  For prescription refill requests, have your pharmacy contact our office and allow 72 hours.    Cancer Center Support Programs:   > Cancer Support Group  2nd Tuesday of the month 1pm-2pm, Journey Room    

## 2019-06-05 DIAGNOSIS — M170 Bilateral primary osteoarthritis of knee: Secondary | ICD-10-CM | POA: Diagnosis not present

## 2019-06-06 ENCOUNTER — Other Ambulatory Visit: Payer: Self-pay | Admitting: *Deleted

## 2019-06-06 DIAGNOSIS — R918 Other nonspecific abnormal finding of lung field: Secondary | ICD-10-CM

## 2019-06-14 ENCOUNTER — Inpatient Hospital Stay (HOSPITAL_COMMUNITY): Payer: Medicare Other | Attending: Hematology

## 2019-06-14 ENCOUNTER — Other Ambulatory Visit: Payer: Self-pay

## 2019-06-14 ENCOUNTER — Ambulatory Visit (HOSPITAL_COMMUNITY)
Admission: RE | Admit: 2019-06-14 | Discharge: 2019-06-14 | Disposition: A | Discharge status: Home / Self Care | Payer: Medicare Other | Source: Ambulatory Visit | Attending: Hematology | Admitting: Hematology

## 2019-06-14 DIAGNOSIS — Z9221 Personal history of antineoplastic chemotherapy: Secondary | ICD-10-CM | POA: Insufficient documentation

## 2019-06-14 DIAGNOSIS — E039 Hypothyroidism, unspecified: Secondary | ICD-10-CM | POA: Diagnosis not present

## 2019-06-14 DIAGNOSIS — R748 Abnormal levels of other serum enzymes: Secondary | ICD-10-CM | POA: Diagnosis not present

## 2019-06-14 DIAGNOSIS — C9211 Chronic myeloid leukemia, BCR/ABL-positive, in remission: Secondary | ICD-10-CM | POA: Diagnosis not present

## 2019-06-14 DIAGNOSIS — Z79899 Other long term (current) drug therapy: Secondary | ICD-10-CM | POA: Insufficient documentation

## 2019-06-14 DIAGNOSIS — C9210 Chronic myeloid leukemia, BCR/ABL-positive, not having achieved remission: Secondary | ICD-10-CM | POA: Insufficient documentation

## 2019-06-14 DIAGNOSIS — Z5111 Encounter for antineoplastic chemotherapy: Secondary | ICD-10-CM | POA: Diagnosis not present

## 2019-06-14 DIAGNOSIS — R918 Other nonspecific abnormal finding of lung field: Secondary | ICD-10-CM | POA: Diagnosis not present

## 2019-06-14 LAB — CBC WITH DIFFERENTIAL/PLATELET
Abs Immature Granulocytes: 0.08 10*3/uL — ABNORMAL HIGH (ref 0.00–0.07)
Basophils Absolute: 0 10*3/uL (ref 0.0–0.1)
Basophils Relative: 0 %
Eosinophils Absolute: 0.2 10*3/uL (ref 0.0–0.5)
Eosinophils Relative: 2 %
HCT: 37.4 % (ref 36.0–46.0)
Hemoglobin: 11.5 g/dL — ABNORMAL LOW (ref 12.0–15.0)
Immature Granulocytes: 1 %
Lymphocytes Relative: 18 %
Lymphs Abs: 1.8 10*3/uL (ref 0.7–4.0)
MCH: 28.3 pg (ref 26.0–34.0)
MCHC: 30.7 g/dL (ref 30.0–36.0)
MCV: 91.9 fL (ref 80.0–100.0)
Monocytes Absolute: 0.7 10*3/uL (ref 0.1–1.0)
Monocytes Relative: 7 %
Neutro Abs: 7.1 10*3/uL (ref 1.7–7.7)
Neutrophils Relative %: 72 %
Platelets: 282 10*3/uL (ref 150–400)
RBC: 4.07 MIL/uL (ref 3.87–5.11)
RDW: 14.8 % (ref 11.5–15.5)
WBC: 9.8 10*3/uL (ref 4.0–10.5)
nRBC: 0 % (ref 0.0–0.2)

## 2019-06-14 LAB — COMPREHENSIVE METABOLIC PANEL
ALT: 22 U/L (ref 0–44)
AST: 28 U/L (ref 15–41)
Albumin: 3.9 g/dL (ref 3.5–5.0)
Alkaline Phosphatase: 164 U/L — ABNORMAL HIGH (ref 38–126)
Anion gap: 9 (ref 5–15)
BUN: 21 mg/dL (ref 8–23)
CO2: 25 mmol/L (ref 22–32)
Calcium: 9.2 mg/dL (ref 8.9–10.3)
Chloride: 108 mmol/L (ref 98–111)
Creatinine, Ser: 0.83 mg/dL (ref 0.44–1.00)
GFR calc Af Amer: >60 mL/min (ref >60)
GFR calc non Af Amer: >60 mL/min (ref >60)
Glucose, Bld: 104 mg/dL — ABNORMAL HIGH (ref 70–99)
Potassium: 4.5 mmol/L (ref 3.5–5.1)
Sodium: 142 mmol/L (ref 135–145)
Total Bilirubin: 0.6 mg/dL (ref 0.3–1.2)
Total Protein: 7.3 g/dL (ref 6.5–8.1)

## 2019-06-14 MED ORDER — IOHEXOL 300 MG/ML  SOLN
75.0000 mL | Freq: Once | INTRAMUSCULAR | Status: AC | PRN
  Administered 2019-06-14: 75 mL via INTRAVENOUS

## 2019-06-28 ENCOUNTER — Telehealth (HOSPITAL_COMMUNITY): Payer: Self-pay | Admitting: *Deleted

## 2019-06-28 LAB — CALR + JAK2 E12-15 + MPL (REFLEXED)

## 2019-06-28 LAB — JAK2 V617F, W REFLEX TO CALR/E12/MPL

## 2019-06-28 NOTE — Telephone Encounter (Signed)
Pt called into clinic wanting to know her results of her CT scan and lab work from two weeks ago. Provider reviewed results and stated the CT was negative for malignancy and the blood work was still pending. Returned call back to patient to let her know what the provider said. Patient verbalized understanding.

## 2019-07-09 ENCOUNTER — Telehealth: Payer: Self-pay

## 2019-07-09 NOTE — Telephone Encounter (Signed)
-----   Message from [name], MD sent at 07/09/2019 11:21 AM EDT ----- Regarding: RE: Upcoming appointment Yes ----- Message ----- From: Donnella Sham, RN Sent: 07/09/2019  11:11 AM EDT To: [name], MD Subject: Upcoming appointment                           Hey,  Patient has a follow-up appointment with you on 07/24/2019.  3 years post-op VATS, following lung nodules.  She also had a chest CT with contrast done by Oncology 3 weeks ago.  Can she be changed to a virtual visit?  She is worried about COVID and going out of the home.  Thanks,  Caryl Pina

## 2019-07-09 NOTE — Telephone Encounter (Signed)
Patient contacted and advised her appointment could be changed to a virtual visit.  She acknowledged receipt.

## 2019-07-11 ENCOUNTER — Other Ambulatory Visit (HOSPITAL_COMMUNITY): Payer: Self-pay | Admitting: Hematology

## 2019-07-13 ENCOUNTER — Other Ambulatory Visit (HOSPITAL_COMMUNITY): Payer: Self-pay | Admitting: Nurse Practitioner

## 2019-07-13 NOTE — Telephone Encounter (Signed)
Pt called into clinic wanting to know her lab results. Provider reviewed her blood work and stated everything was in within normal limits. Called pt back to let her know what provider stating. Pt verbalized understanding.

## 2019-07-18 ENCOUNTER — Other Ambulatory Visit: Payer: Medicare Other

## 2019-07-18 ENCOUNTER — Other Ambulatory Visit (HOSPITAL_COMMUNITY): Payer: Self-pay | Admitting: *Deleted

## 2019-07-18 DIAGNOSIS — C9210 Chronic myeloid leukemia, BCR/ABL-positive, not having achieved remission: Secondary | ICD-10-CM

## 2019-07-18 MED ORDER — NILOTINIB HCL 150 MG PO CAPS: 300.0000 mg | ORAL_CAPSULE | Freq: Two times a day (BID) | ORAL | 6 refills | Status: DC

## 2019-07-20 DIAGNOSIS — M170 Bilateral primary osteoarthritis of knee: Secondary | ICD-10-CM | POA: Diagnosis not present

## 2019-07-20 DIAGNOSIS — M25562 Pain in left knee: Secondary | ICD-10-CM | POA: Diagnosis not present

## 2019-07-20 DIAGNOSIS — M25561 Pain in right knee: Secondary | ICD-10-CM | POA: Diagnosis not present

## 2019-07-23 ENCOUNTER — Telehealth: Payer: Self-pay | Admitting: "Endocrinology

## 2019-07-23 ENCOUNTER — Other Ambulatory Visit (HOSPITAL_COMMUNITY): Payer: Self-pay | Admitting: *Deleted

## 2019-07-23 DIAGNOSIS — E039 Hypothyroidism, unspecified: Secondary | ICD-10-CM

## 2019-07-23 NOTE — Telephone Encounter (Signed)
Can you update lab order °

## 2019-07-23 NOTE — Addendum Note (Signed)
Addended by: Lavell Luster A on: 07/23/2019 11:01 AM   Modules accepted: Orders

## 2019-07-24 ENCOUNTER — Telehealth (INDEPENDENT_AMBULATORY_CARE_PROVIDER_SITE_OTHER): Payer: Medicare Other | Admitting: Thoracic Surgery (Cardiothoracic Vascular Surgery)

## 2019-07-24 ENCOUNTER — Other Ambulatory Visit: Payer: Medicare Other

## 2019-07-24 DIAGNOSIS — R911 Solitary pulmonary nodule: Secondary | ICD-10-CM | POA: Diagnosis not present

## 2019-07-24 NOTE — Progress Notes (Signed)
      [address]       [place] [address]             [phone]      Telephone visit with Andrea Martinez at her request due to COVID-19 situation   Andrea Martinez is a 73 year old woman with a history of CML.  She presented in November 2017 with shortness of breath.  She was found to have a loculated left pleural effusion.  I did a left VATS to drain that effusion.  It was not malignant.  She was noted on CT scan to have an 8 mm right lower lobe lung nodule.  She has been followed with serial CT scans and the nodule has remained stable over time.  I last saw her in the office in August 2019.  In the interim since her last visit she has been feeling well.  She still has CML.  She has not had any respiratory issues.  She denies chest pain, pressure, tightness, or shortness of breath.  No physical exam due to telephone visit.  CT CHEST WITH CONTRAST  TECHNIQUE: Multidetector CT imaging of the chest was performed during intravenous contrast administration.  CONTRAST:  38mL OMNIPAQUE IOHEXOL 300 MG/ML  SOLN  COMPARISON:  Multiple chest radiographs, most recent 02/14/2019. Most recent CT 08/01/2018.  FINDINGS: Cardiovascular: Aortic atherosclerosis. Mild cardiomegaly, without pericardial effusion. No central pulmonary embolism, on this non-dedicated study.  Mediastinum/Nodes: No supraclavicular adenopathy. No axillary adenopathy. Small middle mediastinal nodes are unchanged and not pathologic by size criteria. No hilar adenopathy.  Lungs/Pleura: No pleural fluid. 5 mm right lower lobe pulmonary nodule is similar on 103/4 and can be presumed benign.  No evidence of pneumonia. Bibasilar areas of post infectious or inflammatory scarring or relatively mild and accentuated by decreased lung volumes today. Right-sided pulmonary nodules on the order of 4 mm and less are similar to on the prior and identified on series 4.  Upper Abdomen: Lateral segment left  liver lobe prominence. Cholecystectomy. Normal imaged portions of the spleen, stomach, adrenal glands, kidneys, pancreas. Prominent porta hepatis nodes are similar and likely reactive.  Musculoskeletal: Osteopenia. Prior vertebral augmentation at T12 and L1, new since the prior.  IMPRESSION: 1. No evidence of active malignancy and no explanation for hot flashes. 2. Hepatic morphology which is suspicious for cirrhosis. Correlate with risk factors. 3. Similar appearance of scattered pulmonary nodules, which can be considered benign. 4.  Aortic Atherosclerosis (ICD10-I70.0).   Electronically Signed   By: Abigail Miyamoto M.D.   On: 06/15/2019 13:43 I personally reviewed the CT images and concur with the findings noted above.  Impression Andrea Martinez is a 73 year old woman with a history of CML, osteoarthritis, pleural effusion, lung nodules, irritable bowel syndrome, hypothyroidism, colon cancer, and biliary dyskinesia.  I did a left VATS to drain a loculated pleural effusion in 2017.  That was not malignant.  It resolved and she has not had any further issues with that.  Lung nodules-she said multiple small lung nodules that were first noted on her CT in 2017.  Those have been unchanged over the past 3 years.  Consider benign.  No further follow-up needed.  I will be happy to see Andrea Martinez back anytime in the future if I can be of any further assistance with her care  I spent 5 minutes on the telephone with Andrea Martinez during this visit   Revonda Standard. Roxan Hockey, MD [facility] [phone]

## 2019-07-31 DIAGNOSIS — E039 Hypothyroidism, unspecified: Secondary | ICD-10-CM | POA: Diagnosis not present

## 2019-07-31 LAB — T4, FREE: Free T4: 1.4 ng/dL (ref 0.8–1.8)

## 2019-07-31 LAB — TSH: TSH: 1.84 mIU/L (ref 0.40–4.50)

## 2019-08-02 ENCOUNTER — Other Ambulatory Visit (HOSPITAL_COMMUNITY): Payer: Medicare Other

## 2019-08-07 ENCOUNTER — Telehealth (HOSPITAL_COMMUNITY): Payer: Self-pay | Admitting: Hematology

## 2019-08-09 ENCOUNTER — Other Ambulatory Visit: Payer: Self-pay

## 2019-08-09 ENCOUNTER — Ambulatory Visit (INDEPENDENT_AMBULATORY_CARE_PROVIDER_SITE_OTHER): Payer: Medicare Other | Admitting: "Endocrinology

## 2019-08-09 ENCOUNTER — Ambulatory Visit (HOSPITAL_COMMUNITY): Payer: Medicare Other | Admitting: Hematology

## 2019-08-09 ENCOUNTER — Encounter: Payer: Self-pay | Admitting: "Endocrinology

## 2019-08-09 ENCOUNTER — Other Ambulatory Visit: Payer: Self-pay | Admitting: "Endocrinology

## 2019-08-09 DIAGNOSIS — E039 Hypothyroidism, unspecified: Secondary | ICD-10-CM | POA: Diagnosis not present

## 2019-08-09 MED ORDER — LEVOTHYROXINE SODIUM 125 MCG PO TABS: 125.0000 ug | ORAL_TABLET | Freq: Every day | ORAL | 1 refills | Status: DC

## 2019-08-09 NOTE — Progress Notes (Signed)
08/09/2019                                Endocrinology Telehealth Visit Follow up Note -During COVID -19 Pandemic  I connected with Andrea Martinez on 08/09/2019   by telephone and verified that I am speaking with the correct person using two identifiers. Andrea Martinez, Jun 22, 1946. she has verbally consented to this visit. All issues noted in this document were discussed and addressed. The format was not optimal for physical exam.    Subjective:    Patient ID: Andrea Martinez, female    DOB: 1945/12/27, PCP Andrea Du, MD   Past Medical History:  Diagnosis Date  . Arthritis   . Chronic myelogenous leukemia (CML), BCR-ABL1-positive ([place]) 11/20/2015  . Gastric ulcer 2011   EGD, 5/11  . Hypothyroidism    not on meds, followed by Dr. Elyse Hsu  . Leukocytosis 11/20/2015  . Lipoma    left upper arm  . Pericardial effusion    a. HCAP complicated by pericardial effusion requiring pericardial window 10/2016 and large L pleural effusion requring VATS.  Marland Kitchen Pleural effusion    a. s/p VATS 2017.  Marland Kitchen Pneumonia 10/2016  . PONV (postoperative nausea and vomiting)   . Prolonged QT interval   . Urine incontinence   . UTI (lower urinary tract infection)    frequent   Past Surgical History:  Procedure Laterality Date  . BRAVO [place] STUDY  12/04/2012   Procedure: BRAVO [place];  Surgeon: [name], MD;  [facility];  Service: Endoscopy;  Laterality: N/A;  . CHOLECYSTECTOMY N/A 01/03/2014   Procedure: LAPAROSCOPIC CHOLECYSTECTOMY WITH INTRAOPERATIVE CHOLANGIOGRAM;  Surgeon: Joyice Faster. Cornett, MD;  Location: [place];  Service: General;  Laterality: N/A;  . COLONOSCOPY  01/2004   DR [facility], few small tics  . ESOPHAGOGASTRODUODENOSCOPY  02/2010   gastric ulcers  . ESOPHAGOGASTRODUODENOSCOPY  12/04/2012   [id number] gastritis (inflammation) was found in the gastric antrum; multiple biopsies The duodenal mucosa showed no abnormalities in the bulb and second portion of  the duodenum  . KYPHOPLASTY N/A 02/14/2019   Procedure: KYPHOPLASTY T12 and L1;  Surgeon: Melina Schools, MD;  Location: [facility];  Service: Orthopedics;  Laterality: N/A;  120 mins  . LIPOMA EXCISION  08/02/2011   left shoulder  . NOSE SURGERY    . PARTIAL HYSTERECTOMY     vaginal at age 58 years of age  . TOE DEBRIDEMENT Left 1962   lt great toe  . VIDEO ASSISTED THORACOSCOPY (VATS)/EMPYEMA Left 10/25/2016   Procedure: VIDEO ASSISTED THORACOSCOPY (VATS), BRONCH,DRAINAGE OF PLEURAL EFFUSION,PERICARDIAL WINDOW WITH DRAINAGE OF PERICARDIAL FLUID, TEE;  Surgeon: [name], MD;  Location: [place];  Service: Thoracic;  Laterality: Left;  Marland Kitchen VIDEO BRONCHOSCOPY N/A 10/25/2016   Procedure: VIDEO BRONCHOSCOPY;  Surgeon: [name], MD;  Location: [facility];  Service: Thoracic;  Laterality: N/A;   Social History   Socioeconomic History  . Marital status: Married    Spouse name: Not on file  . Number of children: 2  . Years of education: 12th grade  . Highest education level: Not on file  Occupational History  . Occupation: retired    Fish farm manager: UENPLOYED    Employer: RETIRED  Social Needs  . Financial resource strain: Not on file  . Food insecurity    Worry: Not on file    Inability: Not on file  .  Transportation needs    Medical: Not on file    Non-medical: Not on file  Tobacco Use  . Smoking status: Never Smoker  . Smokeless tobacco: Never Used  Substance and Sexual Activity  . Alcohol use: No    Alcohol/week: 0.0 standard drinks  . Drug use: No  . Sexual activity: Yes    Partners: Male    Birth control/protection: None  Lifestyle  . Physical activity    Days per week: Not on file    Minutes per session: Not on file  . Stress: Not on file  Relationships  . Social Herbalist on phone: Not on file    Gets together: Not on file    Attends religious service: Not on file    Active member of club or organization: Not on file    Attends meetings of clubs or  organizations: Not on file    Relationship status: Not on file  Other Topics Concern  . Not on file  Social History Narrative  . Not on file   Outpatient Encounter Medications as of 08/09/2019  Medication Sig  . cetirizine (ZYRTEC) 10 MG tablet Take 10 mg by mouth daily.   . Cholecalciferol (VITAMIN D) 50 MCG (2000 UT) tablet TAKE 1 CAPSULE (2,000 UNITS TOTAL) BY MOUTH DAILY WITH BREAKFAST.  Marland Kitchen escitalopram (LEXAPRO) 20 MG tablet Take 1.5 tablets (30 mg total) by mouth daily.  Marland Kitchen lactose free nutrition (BOOST) LIQD Take 237 mLs by mouth daily.  Marland Kitchen levothyroxine (SYNTHROID) 125 MCG tablet Take 1 tablet (125 mcg total) by mouth daily before breakfast.  . Multiple Vitamins-Minerals (CENTRUM SILVER PO) Take 1 capsule by mouth daily.  . nilotinib (TASIGNA) 150 MG capsule Take 2 capsules (300 mg total) by mouth every 12 (twelve) hours.  . ondansetron (ZOFRAN) 4 MG tablet Take 1 tablet (4 mg total) by mouth every 8 (eight) hours as needed for nausea or vomiting.  . Probiotic CAPS Take 2 capsules by mouth daily.  . vitamin B-12 (CYANOCOBALAMIN) 1000 MCG tablet Take 1,000 mcg by mouth daily.   . vitamin E 400 UNIT capsule Take 800 Units by mouth daily.   . [DISCONTINUED] levothyroxine (SYNTHROID, LEVOTHROID) 125 MCG tablet Take 1 tablet (125 mcg total) by mouth daily before breakfast.   No facility-administered encounter medications on file as of 08/09/2019.    ALLERGIES: Allergies  Allergen Reactions  . Codeine Nausea Only and Other (See Comments)    Headache  . Sulfonamide Derivatives Nausea Only and Other (See Comments)    Headache  . Esomeprazole Magnesium Nausea Only  . Fluconazole Rash    Redness and blistering on left thigh    VACCINATION STATUS: Immunization History  Administered Date(s) Administered  . Influenza,inj,Quad PF,6+ Mos 09/13/2017  . Influenza-Unspecified 09/24/2015, 11/24/2017    HPI 73 year old female patient with medical history as above. She is being engaged in  telehealth via telephone in follow-up for hypothyroidism . -She is currently on thyroxine 125 g by mouth every morning. She reports compliance to this medication.  - She reports steady weight since last visit.  She complains of some tingling pain on the left side of her jaw, planning to see her PMD Dr. Luan Pulling.  She denies chest pain, shortness of breath. She denies palpitations, tremors, nor heat intolerance.   She is still on nilotinib for CML.    -She has family history of thyroid problem involving goiter which required thyroidectomy in her mother who took thyroid hormone replacement subsequently. -She  denies dysphagia, shortness of breath, voice change. -She is compliant to her medications.  Review of Systems Limited as above.  Objective:    There were no vitals taken for this visit.  Wt Readings from Last 3 Encounters:  05/31/19 225 lb 11.2 oz (102.4 kg)  05/08/19 222 lb (100.7 kg)  02/15/19 228 lb (103.4 kg)     CMP     Component Value Date/Time   NA 142 06/14/2019 1530   K 4.5 06/14/2019 1530   CL 108 06/14/2019 1530   CO2 25 06/14/2019 1530   GLUCOSE 104 (H) 06/14/2019 1530   BUN 21 06/14/2019 1530   CREATININE 0.83 06/14/2019 1530   CREATININE 0.87 07/14/2011 0938   CALCIUM 9.2 06/14/2019 1530   PROT 7.3 06/14/2019 1530   ALBUMIN 3.9 06/14/2019 1530   AST 28 06/14/2019 1530   ALT 22 06/14/2019 1530   ALKPHOS 164 (H) 06/14/2019 1530   BILITOT 0.6 06/14/2019 1530   GFRNONAA >60 06/14/2019 1530   GFRAA >60 06/14/2019 1530   Diabetic Labs (most recent): Lab Results  Component Value Date   HGBA1C 5.3 04/24/2019   HGBA1C 5.3 10/28/2016   Results for GENIEVA, ROCHEL "Andrea Martinez" (MRN [id number]) as of 08/09/2019 11:09  Ref. Range 07/31/2019 11:00  TSH Latest Ref Range: 0.40 - 4.50 mIU/L 1.84  T4,Free(Direct) Latest Ref Range: 0.8 - 1.8 ng/dL 1.4    Assessment & Plan:   1. Hypothyroidism - Her thyroid function tests are consistent with appropriate replacement.   -She is advised to continue on levothyroxine 125 g by mouth every morning.   - We discussed about the correct intake of her thyroid hormone, on empty stomach at fasting, with water, separated by at least 30 minutes from breakfast and other medications,  and separated by more than 4 hours from calcium, iron, multivitamins, acid reflux medications (PPIs). -Patient is made aware of the fact that thyroid hormone replacement is needed for life, dose to be adjusted by periodic monitoring of thyroid function tests.  - Her clinical exam has been  negative for goiter, hence no need for imaging of the thyroid at this time. - I advised patient to maintain close follow up with Andrea Du, MD for primary care needs.   Time for this visit: 15 minutes. Andrea Martinez  participated in the discussions, expressed understanding, and voiced agreement with the above plans.  All questions were answered to her satisfaction. she is encouraged to contact clinic should she have any questions or concerns prior to her return visit.  Follow up plan: Return in about 6 months (around 02/09/2020) for Follow up with Pre-visit Labs.  Glade Lloyd, MD Phone: [phone]  Fax: [phone]  -  This note was partially dictated with voice recognition software. Similar sounding words can be transcribed inadequately or may not  be corrected upon review.  08/09/2019, 11:27 AM

## 2019-08-09 NOTE — Progress Notes (Signed)
tsh

## 2019-08-23 ENCOUNTER — Inpatient Hospital Stay (HOSPITAL_COMMUNITY): Payer: Medicare Other | Attending: Hematology

## 2019-08-23 ENCOUNTER — Other Ambulatory Visit: Payer: Self-pay

## 2019-08-23 DIAGNOSIS — C9210 Chronic myeloid leukemia, BCR/ABL-positive, not having achieved remission: Secondary | ICD-10-CM | POA: Diagnosis not present

## 2019-08-23 DIAGNOSIS — Z9221 Personal history of antineoplastic chemotherapy: Secondary | ICD-10-CM | POA: Diagnosis not present

## 2019-08-23 DIAGNOSIS — R600 Localized edema: Secondary | ICD-10-CM | POA: Insufficient documentation

## 2019-08-23 DIAGNOSIS — Z79899 Other long term (current) drug therapy: Secondary | ICD-10-CM | POA: Diagnosis not present

## 2019-08-23 DIAGNOSIS — E669 Obesity, unspecified: Secondary | ICD-10-CM | POA: Insufficient documentation

## 2019-08-23 DIAGNOSIS — R61 Generalized hyperhidrosis: Secondary | ICD-10-CM | POA: Diagnosis not present

## 2019-08-23 DIAGNOSIS — M1990 Unspecified osteoarthritis, unspecified site: Secondary | ICD-10-CM | POA: Insufficient documentation

## 2019-08-23 LAB — COMPREHENSIVE METABOLIC PANEL
ALT: 26 U/L (ref 0–44)
AST: 27 U/L (ref 15–41)
Albumin: 3.9 g/dL (ref 3.5–5.0)
Alkaline Phosphatase: 188 U/L — ABNORMAL HIGH (ref 38–126)
Anion gap: 10 (ref 5–15)
BUN: 25 mg/dL — ABNORMAL HIGH (ref 8–23)
CO2: 25 mmol/L (ref 22–32)
Calcium: 8.9 mg/dL (ref 8.9–10.3)
Chloride: 104 mmol/L (ref 98–111)
Creatinine, Ser: 0.89 mg/dL (ref 0.44–1.00)
GFR calc Af Amer: >60 mL/min (ref >60)
GFR calc non Af Amer: >60 mL/min (ref >60)
Glucose, Bld: 134 mg/dL — ABNORMAL HIGH (ref 70–99)
Potassium: 4.5 mmol/L (ref 3.5–5.1)
Sodium: 139 mmol/L (ref 135–145)
Total Bilirubin: 0.7 mg/dL (ref 0.3–1.2)
Total Protein: 7.7 g/dL (ref 6.5–8.1)

## 2019-08-23 LAB — CBC WITH DIFFERENTIAL/PLATELET
Abs Immature Granulocytes: 0.09 10*3/uL — ABNORMAL HIGH (ref 0.00–0.07)
Basophils Absolute: 0 10*3/uL (ref 0.0–0.1)
Basophils Relative: 0 %
Eosinophils Absolute: 0.1 10*3/uL (ref 0.0–0.5)
Eosinophils Relative: 1 %
HCT: 40 % (ref 36.0–46.0)
Hemoglobin: 12.2 g/dL (ref 12.0–15.0)
Immature Granulocytes: 1 %
Lymphocytes Relative: 11 %
Lymphs Abs: 1.3 10*3/uL (ref 0.7–4.0)
MCH: 28.7 pg (ref 26.0–34.0)
MCHC: 30.5 g/dL (ref 30.0–36.0)
MCV: 94.1 fL (ref 80.0–100.0)
Monocytes Absolute: 0.6 10*3/uL (ref 0.1–1.0)
Monocytes Relative: 5 %
Neutro Abs: 9.9 10*3/uL — ABNORMAL HIGH (ref 1.7–7.7)
Neutrophils Relative %: 82 %
Platelets: 313 10*3/uL (ref 150–400)
RBC: 4.25 MIL/uL (ref 3.87–5.11)
RDW: 13.2 % (ref 11.5–15.5)
WBC: 11.9 10*3/uL — ABNORMAL HIGH (ref 4.0–10.5)
nRBC: 0 % (ref 0.0–0.2)

## 2019-08-30 ENCOUNTER — Encounter (HOSPITAL_COMMUNITY): Payer: Self-pay | Admitting: Hematology

## 2019-08-30 ENCOUNTER — Inpatient Hospital Stay (HOSPITAL_BASED_OUTPATIENT_CLINIC_OR_DEPARTMENT_OTHER): Payer: Medicare Other | Admitting: Hematology

## 2019-08-30 ENCOUNTER — Other Ambulatory Visit: Payer: Self-pay

## 2019-08-30 VITALS — BP 149/60 | HR 77 | Temp 95.8°F | Resp 18 | Wt 223.0 lb

## 2019-08-30 DIAGNOSIS — C9210 Chronic myeloid leukemia, BCR/ABL-positive, not having achieved remission: Secondary | ICD-10-CM | POA: Diagnosis not present

## 2019-08-30 DIAGNOSIS — R61 Generalized hyperhidrosis: Secondary | ICD-10-CM | POA: Diagnosis not present

## 2019-08-30 DIAGNOSIS — Z79899 Other long term (current) drug therapy: Secondary | ICD-10-CM | POA: Diagnosis not present

## 2019-08-30 DIAGNOSIS — R600 Localized edema: Secondary | ICD-10-CM | POA: Diagnosis not present

## 2019-08-30 DIAGNOSIS — M1990 Unspecified osteoarthritis, unspecified site: Secondary | ICD-10-CM | POA: Diagnosis not present

## 2019-08-30 DIAGNOSIS — Z9221 Personal history of antineoplastic chemotherapy: Secondary | ICD-10-CM | POA: Diagnosis not present

## 2019-08-30 MED ORDER — TRAMADOL HCL 50 MG PO TABS: 50.0000 mg | ORAL_TABLET | Freq: Two times a day (BID) | ORAL | 0 refills | Status: DC | PRN

## 2019-08-30 NOTE — Progress Notes (Signed)
[place] [place], [place] [address]   CLINIC:  Medical Oncology/Hematology  PCP:  Sinda Du, MD [place] [address] [phone]   REASON FOR VISIT:  Follow-up for CML  CURRENT THERAPY: Tasigna   BRIEF ONCOLOGIC HISTORY:  Oncology History  CML (chronic myeloid leukemia) ([place])  11/21/2015 Initial Diagnosis   Chronic myelogenous leukemia (CML), BCR-ABL1-positive ([place])   11/21/2015 Miscellaneous   Seen by Dr. Florene Glen [facility]   11/21/2015 -  Chemotherapy   Hydrea 1000 mg and Allopurinol 300 mg daily (due to high uric acid level)   11/21/2015 Tumor Marker   BCR P210 Ratio of Fusion to Control, Blood 0.8941    P210 IS % Ratio, Blood 85.834%     01/02/2016 - 10/23/2016 Chemotherapy   Tasigna   01/08/2016 Miscellaneous   EKG- QT/QTc: 382/440 ms   01/16/2016 Miscellaneous   EKG- QT/QTc: 364/419   03/30/2016 Tumor Marker   b2a2 transcript: 0.317 b3a2 transcript: 0.194   10/23/2016 - 11/05/2016 Hospital Admission   large Left Pleural Effusion and Pericardial Effusion that is now s/p VATS with Drainage, and Drainage of Pericardial Fluid with Window and Biopsy of Pericardium   12/06/2016 -  Chemotherapy   Tasigna restarted    05/24/2017 Adverse Reaction   Prolonged QTc on EKG; likely d/t chemo. Tasigna ON HOLD until next visit.    06/10/2017 -  Chemotherapy   Resumed Tasigna   07/13/2017 Miscellaneous   Evaluated by Cardiology for prolonged QTc. Per Dr. Bronson Ing, patient's QT intervals are not prolonged to the point of potentially promoting a ventricular arrhythmia. He thinks she can continue on current medical therapy for her CML with serial ECG monitoring.   12/27/2017 Miscellaneous   EKG: QT/QTc 398/435       INTERVAL HISTORY:  Andrea Martinez 73 y.o. female seen for follow-up of CML.  She denies any fevers or weight loss.  Has occasional daytime sweating.  Appetite and energy levels are 50%.  Complains of right knee  pain 10 out of 10 in intensity.  She has bone-on-bone arthritis.  She had cortisone shot in August which did not help much.  She has sleep problems as result of it.  Leg swelling has been stable.  Denies any ER visits or hospitalizations.  REVIEW OF SYSTEMS:  Review of Systems  Constitutional: Negative for fatigue.       Day sweats, night sweats  HENT:  Negative.   Eyes: Negative.   Respiratory: Negative.   Cardiovascular: Positive for leg swelling.  Gastrointestinal: Negative.   Endocrine: Negative for hot flashes.  Genitourinary: Negative.    Musculoskeletal: Positive for arthralgias.  Skin: Negative.   Hematological: Negative.   Psychiatric/Behavioral: Positive for sleep disturbance.  All other systems reviewed and are negative.    PAST MEDICAL/SURGICAL HISTORY:  Past Medical History:  Diagnosis Date  . Arthritis   . Chronic myelogenous leukemia (CML), BCR-ABL1-positive ([place]) 11/20/2015  . Gastric ulcer 2011   EGD, 5/11  . Hypothyroidism    not on meds, followed by Dr. Elyse Hsu  . Leukocytosis 11/20/2015  . Lipoma    left upper arm  . Pericardial effusion    a. HCAP complicated by pericardial effusion requiring pericardial window 10/2016 and large L pleural effusion requring VATS.  Marland Kitchen Pleural effusion    a. s/p VATS 2017.  Marland Kitchen Pneumonia 10/2016  . PONV (postoperative nausea and vomiting)   . Prolonged QT interval   . Urine incontinence   . UTI (lower  urinary tract infection)    frequent   Past Surgical History:  Procedure Laterality Date  . BRAVO [place] STUDY  12/04/2012   Procedure: BRAVO [place];  Surgeon: [name], MD;  [facility];  Service: Endoscopy;  Laterality: N/A;  . CHOLECYSTECTOMY N/A 01/03/2014   Procedure: LAPAROSCOPIC CHOLECYSTECTOMY WITH INTRAOPERATIVE CHOLANGIOGRAM;  Surgeon: Joyice Faster. Cornett, MD;  Location: [facility];  Service: General;  Laterality: N/A;  . COLONOSCOPY  01/2004   DR [facility], few small tics  .  ESOPHAGOGASTRODUODENOSCOPY  02/2010   gastric ulcers  . ESOPHAGOGASTRODUODENOSCOPY  12/04/2012   [id number] gastritis (inflammation) was found in the gastric antrum; multiple biopsies The duodenal mucosa showed no abnormalities in the bulb and second portion of the duodenum  . KYPHOPLASTY N/A 02/14/2019   Procedure: KYPHOPLASTY T12 and L1;  Surgeon: Melina Schools, MD;  Location: [facility] [place];  Service: Orthopedics;  Laterality: N/A;  120 mins  . LIPOMA EXCISION  08/02/2011   left shoulder  . NOSE SURGERY    . PARTIAL HYSTERECTOMY     vaginal at age 53 years of age  . TOE DEBRIDEMENT Left 1962   lt great toe  . VIDEO ASSISTED THORACOSCOPY (VATS)/EMPYEMA Left 10/25/2016   Procedure: VIDEO ASSISTED THORACOSCOPY (VATS), BRONCH,DRAINAGE OF PLEURAL EFFUSION,PERICARDIAL WINDOW WITH DRAINAGE OF PERICARDIAL FLUID, TEE;  Surgeon: [name], MD;  Location: [facility];  Service: Thoracic;  Laterality: Left;  Marland Kitchen VIDEO BRONCHOSCOPY N/A 10/25/2016   Procedure: VIDEO BRONCHOSCOPY;  Surgeon: [name], MD;  Location: [facility];  Service: Thoracic;  Laterality: N/A;     SOCIAL HISTORY:  Social History   Socioeconomic History  . Marital status: Married    Spouse name: Not on file  . Number of children: 2  . Years of education: 12th grade  . Highest education level: Not on file  Occupational History  . Occupation: retired    Fish farm manager: UENPLOYED    Employer: RETIRED  Social Needs  . Financial resource strain: Not on file  . Food insecurity    Worry: Not on file    Inability: Not on file  . Transportation needs    Medical: Not on file    Non-medical: Not on file  Tobacco Use  . Smoking status: Never Smoker  . Smokeless tobacco: Never Used  Substance and Sexual Activity  . Alcohol use: No    Alcohol/week: 0.0 standard drinks  . Drug use: No  . Sexual activity: Yes    Partners: Male    Birth control/protection: None  Lifestyle  . Physical activity    Days per week: Not on file     Minutes per session: Not on file  . Stress: Not on file  Relationships  . Social Herbalist on phone: Not on file    Gets together: Not on file    Attends religious service: Not on file    Active member of club or organization: Not on file    Attends meetings of clubs or organizations: Not on file    Relationship status: Not on file  . Intimate partner violence    Fear of current or ex partner: Not on file    Emotionally abused: Not on file    Physically abused: Not on file    Forced sexual activity: Not on file  Other Topics Concern  . Not on file  Social History Narrative  . Not on file    FAMILY HISTORY:  Family History  Problem Relation Age of Onset  . Lung cancer Father   . COPD Mother   . Colon cancer Neg Hx   . Anesthesia problems Neg Hx   . Hypotension Neg Hx   . Malignant hyperthermia Neg Hx   . Pseudochol deficiency Neg Hx     CURRENT MEDICATIONS:  Outpatient Encounter Medications as of 08/30/2019  Medication Sig  . cetirizine (ZYRTEC) 10 MG tablet Take 10 mg by mouth daily.   . Cholecalciferol (VITAMIN D) 50 MCG (2000 UT) tablet TAKE 1 CAPSULE (2,000 UNITS TOTAL) BY MOUTH DAILY WITH BREAKFAST.  Marland Kitchen escitalopram (LEXAPRO) 20 MG tablet Take 1.5 tablets (30 mg total) by mouth daily.  Marland Kitchen lactose free nutrition (BOOST) LIQD Take 237 mLs by mouth daily.  Marland Kitchen levothyroxine (SYNTHROID) 125 MCG tablet Take 1 tablet (125 mcg total) by mouth daily before breakfast.  . Multiple Vitamins-Minerals (CENTRUM SILVER PO) Take 1 capsule by mouth daily.  . nilotinib (TASIGNA) 150 MG capsule Take 2 capsules (300 mg total) by mouth every 12 (twelve) hours.  . Probiotic CAPS Take 2 capsules by mouth daily.  . vitamin B-12 (CYANOCOBALAMIN) 1000 MCG tablet Take 1,000 mcg by mouth daily.   . vitamin E 400 UNIT capsule Take 800 Units by mouth daily.   . traMADol (ULTRAM) 50 MG tablet Take 1 tablet (50 mg total) by mouth 2 (two) times daily as needed.  . [DISCONTINUED] ondansetron  (ZOFRAN) 4 MG tablet Take 1 tablet (4 mg total) by mouth every 8 (eight) hours as needed for nausea or vomiting. (Patient not taking: Reported on 08/30/2019)   No facility-administered encounter medications on file as of 08/30/2019.     ALLERGIES:  Allergies  Allergen Reactions  . Codeine Nausea Only and Other (See Comments)    Headache  . Sulfonamide Derivatives Nausea Only and Other (See Comments)    Headache  . Esomeprazole Magnesium Nausea Only  . Fluconazole Rash    Redness and blistering on left thigh     PHYSICAL EXAM:  ECOG Performance status: 2   Vitals:   08/30/19 1148  BP: (!) 149/60  Pulse: 77  Resp: 18  Temp: (!) 95.8 F (35.4 C)  SpO2: 97%   Filed Weights   08/30/19 1148  Weight: 223 lb (101.2 kg)    Physical Exam Constitutional:      Appearance: Normal appearance. She is obese.  HENT:     Head: Normocephalic.     Nose: Nose normal.     Mouth/Throat:     Mouth: Mucous membranes are moist.     Pharynx: Oropharynx is clear.  Eyes:     Pupils: Pupils are equal, round, and reactive to light.  Neck:     Musculoskeletal: Normal range of motion.  Cardiovascular:     Rate and Rhythm: Normal rate and regular rhythm.  Pulmonary:     Effort: Pulmonary effort is normal.     Breath sounds: Normal breath sounds.  Abdominal:     General: Bowel sounds are normal.     Palpations: Abdomen is soft.  Musculoskeletal:     Right lower leg: Edema present.     Left lower leg: Edema present.  Skin:    General: Skin is warm and dry.  Neurological:     General: No focal deficit present.     Mental Status: She is oriented to person, place, and time. Mental status is at baseline.  Psychiatric:        Mood and Affect: Mood normal.  Behavior: Behavior normal.        Thought Content: Thought content normal.        Judgment: Judgment normal.      LABORATORY DATA:  I have reviewed the labs as listed.  CBC    Component Value Date/Time   WBC 11.9 (H)  08/23/2019 1017   RBC 4.25 08/23/2019 1017   HGB 12.2 08/23/2019 1017   HCT 40.0 08/23/2019 1017   PLT 313 08/23/2019 1017   MCV 94.1 08/23/2019 1017   MCH 28.7 08/23/2019 1017   MCHC 30.5 08/23/2019 1017   RDW 13.2 08/23/2019 1017   LYMPHSABS 1.3 08/23/2019 1017   MONOABS 0.6 08/23/2019 1017   EOSABS 0.1 08/23/2019 1017   BASOSABS 0.0 08/23/2019 1017   CMP Latest Ref Rng & Units 08/23/2019 06/14/2019 04/24/2019  Glucose 70 - 99 mg/dL 134(H) 104(H) 119(H)  BUN 8 - 23 mg/dL 25(H) 21 29(H)  Creatinine 0.44 - 1.00 mg/dL 0.89 0.83 0.87  Sodium 135 - 145 mmol/L 139 142 139  Potassium 3.5 - 5.1 mmol/L 4.5 4.5 4.3  Chloride 98 - 111 mmol/L 104 108 105  CO2 22 - 32 mmol/L 25 25 24   Calcium 8.9 - 10.3 mg/dL 8.9 9.2 9.1  Total Protein 6.5 - 8.1 g/dL 7.7 7.3 7.9  Total Bilirubin 0.3 - 1.2 mg/dL 0.7 0.6 0.6  Alkaline Phos 38 - 126 U/L 188(H) 164(H) 174(H)  AST 15 - 41 U/L 27 28 20   ALT 0 - 44 U/L 26 22 16        DIAGNOSTIC IMAGING:  I have independently reviewed the scans and discussed with the patient.    ASSESSMENT & PLAN:   CML (chronic myeloid leukemia) ([place]) 1.  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 -She developed left pleural and pericardial effusion, requiring pericardial window on 10/25/2016, fluid cytology negative. -She was started back on Neurontin 300 mg every 12 hours on 12/06/2016. - BCR/ABL by quantitative PCR on 04/24/2019 was undetectable.  It was undetectable in November 2019 as well. - White count today is 11.9, neutrophils are 9.9. -She is continuing to tolerate Nilotinib very well.  She will come back in 3 months for follow-up.  I plan to repeat BCR/ABL prior to next visit. -If they remain undetectable, we will switch her to 73-month visits.  2.  Daytime sweats: -She reported daytime sweats, predominantly on the face and back of the neck, at least 2 episodes per day for past 2 years. - Jak 2 V6 73F mutation testing and reflex testing was negative  for leukocytosis. - CT of the chest on 06/14/2019 did not show any evidence of malignancy.  Hepatic morphology suspicious for cirrhosis.  Similar appearance of scattered pulmonary nodules considered benign. -CT abdomen and pelvis on 12/16/2018 shows normal-sized spleen with no adenopathy.  3.  Right knee pain: -She has gotten cortisone shots in the first week of August without improvement. -She is going to talk to a Psychologist, sport and exercise in [place] for knee replacement. - I have given a prescription for tramadol 50 mg as needed #30 in the interim.  Total time spent is 25 minutes with more than 50% of the time spent face-to-face discussing her disease state, treatment plan and coordination of care.    Orders placed this encounter:  Orders Placed This Encounter  Procedures  . CBC with Differential/Platelet  . Comprehensive metabolic panel  . BCR-ABL1, CML/ALL, PCR, QUANT      Derek Jack, MD [place] [phone]

## 2019-08-30 NOTE — Patient Instructions (Signed)
[place] at [facility] Discharge Instructions  You were seen today by Dr. Delton Coombes. He went over your recent lab and scan results. He will see you back in 10 weeks for labs and follow up.   Thank you for choosing [place] at [facility] to provide your oncology and hematology care.  To afford each patient quality time with our provider, please arrive at least 15 minutes before your scheduled appointment time.   If you have a lab appointment with the [facility] please come in thru the  Main Entrance and check in at the main information desk  You need to re-schedule your appointment should you arrive 10 or more minutes late.  We strive to give you quality time with our providers, and arriving late affects you and other patients whose appointments are after yours.  Also, if you no show three or more times for appointments you may be dismissed from the clinic at the providers discretion.     Again, thank you for choosing [facility].  Our hope is that these requests will decrease the amount of time that you wait before being seen by our physicians.       _____________________________________________________________  Should you have questions after your visit to [facility], please contact our office at [phone] between the hours of 8:00 a.m. and 4:30 p.m.  Voicemails left after 4:00 p.m. will not be returned until the following business day.  For prescription refill requests, have your pharmacy contact our office and allow 72 hours.    Cancer Center Support Programs:   > Cancer Support Group  2nd Tuesday of the month 1pm-2pm, Journey Room

## 2019-08-30 NOTE — Assessment & Plan Note (Signed)
1.  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 -She developed left pleural and pericardial effusion, requiring pericardial window on 10/25/2016, fluid cytology negative. -She was started back on Neurontin 300 mg every 12 hours on 12/06/2016. - BCR/ABL by quantitative PCR on 04/24/2019 was undetectable.  It was undetectable in November 2019 as well. - White count today is 11.9, neutrophils are 9.9. -She is continuing to tolerate Nilotinib very well.  She will come back in 3 months for follow-up.  I plan to repeat BCR/ABL prior to next visit. -If they remain undetectable, we will switch her to 27-month visits.  2.  Daytime sweats: -She reported daytime sweats, predominantly on the face and back of the neck, at least 2 episodes per day for past 2 years. - Jak 2 V6 39F mutation testing and reflex testing was negative for leukocytosis. - CT of the chest on 06/14/2019 did not show any evidence of malignancy.  Hepatic morphology suspicious for cirrhosis.  Similar appearance of scattered pulmonary nodules considered benign. -CT abdomen and pelvis on 12/16/2018 shows normal-sized spleen with no adenopathy.  3.  Right knee pain: -She has gotten cortisone shots in the first week of August without improvement. -She is going to talk to a Psychologist, sport and exercise in [place] for knee replacement. - I have given a prescription for tramadol 50 mg as needed #30 in the interim.

## 2019-09-01 LAB — BCR-ABL1, CML/ALL, PCR, QUANT

## 2019-09-11 DIAGNOSIS — M1990 Unspecified osteoarthritis, unspecified site: Secondary | ICD-10-CM | POA: Diagnosis not present

## 2019-09-11 DIAGNOSIS — Z23 Encounter for immunization: Secondary | ICD-10-CM | POA: Diagnosis not present

## 2019-09-11 DIAGNOSIS — C9220 Atypical chronic myeloid leukemia, BCR/ABL-negative, not having achieved remission: Secondary | ICD-10-CM | POA: Diagnosis not present

## 2019-09-11 DIAGNOSIS — K210 Gastro-esophageal reflux disease with esophagitis: Secondary | ICD-10-CM | POA: Diagnosis not present

## 2019-09-11 DIAGNOSIS — E079 Disorder of thyroid, unspecified: Secondary | ICD-10-CM | POA: Diagnosis not present

## 2019-09-13 DIAGNOSIS — M170 Bilateral primary osteoarthritis of knee: Secondary | ICD-10-CM | POA: Diagnosis not present

## 2019-10-02 DIAGNOSIS — M25661 Stiffness of right knee, not elsewhere classified: Secondary | ICD-10-CM | POA: Diagnosis not present

## 2019-10-02 DIAGNOSIS — M25561 Pain in right knee: Secondary | ICD-10-CM | POA: Diagnosis not present

## 2019-10-02 DIAGNOSIS — M1711 Unilateral primary osteoarthritis, right knee: Secondary | ICD-10-CM | POA: Diagnosis not present

## 2019-10-10 ENCOUNTER — Other Ambulatory Visit: Payer: Self-pay | Admitting: "Endocrinology

## 2019-10-16 NOTE — Patient Instructions (Addendum)
DUE TO COVID-19 ONLY ONE VISITOR IS ALLOWED TO COME WITH YOU AND STAY IN THE WAITING ROOM ONLY DURING PRE OP AND PROCEDURE DAY OF SURGERY. THE 1 VISITOR MAY VISIT WITH YOU AFTER SURGERY IN YOUR PRIVATE ROOM DURING VISITING HOURS ONLY!  YOU NEED TO HAVE A COVID 19 TEST ON 11-9/20  @ 12:05 PM, THIS TEST MUST BE DONE BEFORE SURGERY, COME  [address], [address] [place] , [address].  ([place]) ONCE YOUR COVID TEST IS COMPLETED, PLEASE BEGIN THE QUARANTINE INSTRUCTIONS AS OUTLINED IN YOUR HANDOUT.                Andrea Martinez  10/16/2019   Your procedure is scheduled on: 10-22-19   Report to [facility] Main  Entrance    Report to Admitting at 8:00 AM     Call this number if you have problems the morning of surgery [phone]    Remember: NO SOLID FOOD AFTER MIDNIGHT THE NIGHT PRIOR TO SURGERY. NOTHING BY MOUTH EXCEPT CLEAR LIQUIDS UNTIL 7:30 AM . PLEASE FINISH ENSURE DRINK PER SURGEON ORDER  WHICH NEEDS TO BE COMPLETED AT 7:30 AM.   CLEAR LIQUID DIET   Foods Allowed                                                                     Foods Excluded  Coffee and tea, regular and decaf                             liquids that you cannot  Plain Jell-O any favor except red or purple                                           see through such as: Fruit ices (not with fruit pulp)                                     milk, soups, orange juice  Iced Popsicles                                    All solid food Carbonated beverages, regular and diet                                    Cranberry, grape and apple juices Sports drinks like Gatorade Lightly seasoned clear broth or consume(fat free) Sugar, honey syrup  Sample Menu Breakfast                                Lunch                                     Supper Cranberry juice  Beef broth                            Chicken broth Jell-O                                     Grape juice                            Apple juice Coffee or tea                        Jell-O                                      Popsicle                                                Coffee or tea                        Coffee or tea  _____________________________________________________________________       Take these medicines the morning of surgery with A SIP OF WATER:  Levothyroxine (Synthroid)  BRUSH YOUR TEETH MORNING OF SURGERY AND RINSE YOUR MOUTH OUT, NO CHEWING GUM CANDY OR MINTS.                                 You may not have any metal on your body including hair pins and              piercings     Do not wear jewelry, make-up, lotions, powders or perfumes, deodorant              Do not wear nail polish on your fingernails.  Do not shave  48 hours prior to surgery.                 Do not bring valuables to the hospital. [place].  Contacts, dentures or bridgework may not be worn into surgery.  Leave suitcase in the car. After surgery it may be brought to your room.     Special Instructions: N/A              Please read over the following fact sheets you were given: _____________________________________________________________________             [facility] - Preparing for Surgery Before surgery, you can play an important role.  Because skin is not sterile, your skin needs to be as free of germs as possible.  You can reduce the number of germs on your skin by washing with CHG (chlorahexidine gluconate) soap before surgery.  CHG is an antiseptic cleaner which kills germs and bonds with the skin to continue killing germs even after washing. Please DO NOT use if you have an allergy to CHG or antibacterial soaps.  If your skin becomes reddened/irritated stop using the CHG and inform your nurse when you  arrive at Short Stay. Do not shave (including legs and underarms) for at least 48 hours prior to the first CHG shower.  You may shave your  face/neck. Please follow these instructions carefully:  1.  Shower with CHG Soap the night before surgery and the  morning of Surgery.  2.  If you choose to wash your hair, wash your hair first as usual with your  normal  shampoo.  3.  After you shampoo, rinse your hair and body thoroughly to remove the  shampoo.                           4.  Use CHG as you would any other liquid soap.  You can apply chg directly  to the skin and wash                       Gently with a scrungie or clean washcloth.  5.  Apply the CHG Soap to your body ONLY FROM THE NECK DOWN.   Do not use on face/ open                           Wound or open sores. Avoid contact with eyes, ears mouth and genitals (private parts).                       Wash face,  Genitals (private parts) with your normal soap.             6.  Wash thoroughly, paying special attention to the area where your surgery  will be performed.  7.  Thoroughly rinse your body with warm water from the neck down.  8.  DO NOT shower/wash with your normal soap after using and rinsing off  the CHG Soap.                9.  Pat yourself dry with a clean towel.            10.  Wear clean pajamas.            11.  Place clean sheets on your bed the night of your first shower and do not  sleep with pets. Day of Surgery : Do not apply any lotions/deodorants the morning of surgery.  Please wear clean clothes to the hospital/surgery center.  FAILURE TO FOLLOW THESE INSTRUCTIONS MAY RESULT IN THE CANCELLATION OF YOUR SURGERY PATIENT SIGNATURE_________________________________  NURSE SIGNATURE__________________________________  ________________________________________________________________________   [name]  An incentive spirometer is a tool that can help keep your lungs clear and active. This tool measures how well you are filling your lungs with each breath. Taking long deep breaths may help reverse or decrease the chance of developing breathing  (pulmonary) problems (especially infection) following:  A long period of time when you are unable to move or be active. BEFORE THE PROCEDURE   If the spirometer includes an indicator to show your best effort, your nurse or respiratory therapist will set it to a desired goal.  If possible, sit up straight or lean slightly forward. Try not to slouch.  Hold the incentive spirometer in an upright position. INSTRUCTIONS FOR USE  1. Sit on the edge of your bed if possible, or sit up as far as you can in bed or on a chair. 2. Hold the incentive spirometer in an upright position. 3. Breathe out normally.  4. Place the mouthpiece in your mouth and seal your lips tightly around it. 5. Breathe in slowly and as deeply as possible, raising the piston or the ball toward the top of the column. 6. Hold your breath for 3-5 seconds or for as long as possible. Allow the piston or ball to fall to the bottom of the column. 7. Remove the mouthpiece from your mouth and breathe out normally. 8. Rest for a few seconds and repeat Steps 1 through 7 at least 10 times every 1-2 hours when you are awake. Take your time and take a few normal breaths between deep breaths. 9. The spirometer may include an indicator to show your best effort. Use the indicator as a goal to work toward during each repetition. 10. After each set of 10 deep breaths, practice coughing to be sure your lungs are clear. If you have an incision (the cut made at the time of surgery), support your incision when coughing by placing a pillow or rolled up towels firmly against it. Once you are able to get out of bed, walk around indoors and cough well. You may stop using the incentive spirometer when instructed by your caregiver.  RISKS AND COMPLICATIONS  Take your time so you do not get dizzy or light-headed.  If you are in pain, you may need to take or ask for pain medication before doing incentive spirometry. It is harder to take a deep breath if you  are having pain. AFTER USE  Rest and breathe slowly and easily.  It can be helpful to keep track of a log of your progress. Your caregiver can provide you with a simple table to help with this. If you are using the spirometer at home, follow these instructions: [place] IF:   You are having difficultly using the spirometer.  You have trouble using the spirometer as often as instructed.  Your pain medication is not giving enough relief while using the spirometer.  You develop fever of 100.5 F (38.1 C) or higher. SEEK IMMEDIATE MEDICAL CARE IF:   You cough up bloody sputum that had not been present before.  You develop fever of 102 F (38.9 C) or greater.  You develop worsening pain at or near the incision site. MAKE SURE YOU:   Understand these instructions.  Will watch your condition.  Will get help right away if you are not doing well or get worse. Document Released: 04/11/2007 Document Revised: 02/21/2012 Document Reviewed: 06/12/2007 Exit[employer] Patient Information 2014 [employer], [place].   ________________________________________________________________________  WHAT IS A BLOOD TRANSFUSION? Blood Transfusion Information  A transfusion is the replacement of blood or some of its parts. Blood is made up of multiple cells which provide different functions.  Red blood cells carry oxygen and are used for blood loss replacement.  White blood cells fight against infection.  Platelets control bleeding.  Plasma helps clot blood.  Other blood products are available for specialized needs, such as hemophilia or other clotting disorders. BEFORE THE TRANSFUSION  Who gives blood for transfusions?   Healthy volunteers who are fully evaluated to make sure their blood is safe. This is blood bank blood. Transfusion therapy is the safest it has ever been in the practice of medicine. Before blood is taken from a donor, a complete history is taken to make sure that person has  no history of diseases nor engages in risky social behavior (examples are intravenous drug use or sexual activity with multiple partners). The donor's travel history  is screened to minimize risk of transmitting infections, such as malaria. The donated blood is tested for signs of infectious diseases, such as HIV and hepatitis. The blood is then tested to be sure it is compatible with you in order to minimize the chance of a transfusion reaction. If you or a relative donates blood, this is often done in anticipation of surgery and is not appropriate for emergency situations. It takes many days to process the donated blood. RISKS AND COMPLICATIONS Although transfusion therapy is very safe and saves many lives, the main dangers of transfusion include:   Getting an infectious disease.  Developing a transfusion reaction. This is an allergic reaction to something in the blood you were given. Every precaution is taken to prevent this. The decision to have a blood transfusion has been considered carefully by your caregiver before blood is given. Blood is not given unless the benefits outweigh the risks. AFTER THE TRANSFUSION  Right after receiving a blood transfusion, you will usually feel much better and more energetic. This is especially true if your red blood cells have gotten low (anemic). The transfusion raises the level of the red blood cells which carry oxygen, and this usually causes an energy increase.  The nurse administering the transfusion will monitor you carefully for complications. HOME CARE INSTRUCTIONS  No special instructions are needed after a transfusion. You may find your energy is better. Speak with your caregiver about any limitations on activity for underlying diseases you may have. SEEK MEDICAL CARE IF:   Your condition is not improving after your transfusion.  You develop redness or irritation at the intravenous (IV) site. SEEK IMMEDIATE MEDICAL CARE IF:  Any of the following  symptoms occur over the next 12 hours:  Shaking chills.  You have a temperature by mouth above 102 F (38.9 C), not controlled by medicine.  Chest, back, or muscle pain.  People around you feel you are not acting correctly or are confused.  Shortness of breath or difficulty breathing.  Dizziness and fainting.  You get a rash or develop hives.  You have a decrease in urine output.  Your urine turns a dark color or changes to pink, red, or brown. Any of the following symptoms occur over the next 10 days:  You have a temperature by mouth above 102 F (38.9 C), not controlled by medicine.  Shortness of breath.  Weakness after normal activity.  The white part of the eye turns yellow (jaundice).  You have a decrease in the amount of urine or are urinating less often.  Your urine turns a dark color or changes to pink, red, or brown. Document Released: 11/26/2000 Document Revised: 02/21/2012 Document Reviewed: 07/15/2008 [facility] Patient Information 2014 [place], [place].  _______________________________________________________________________

## 2019-10-16 NOTE — Progress Notes (Signed)
PCP - Sinda Du w/ surgical clearance on chart date 09-25-19   FYI... Dr. Roxan Hockey Cardiothoracic last seen on 07-24-19 instructions to f/u as needed.   Cardiologist - Dr Lloyd Huger  Chest x-ray - CT w/ contrast 06-14-19   EKG - 12-25-18  Stress Test -  ECHO -  Cardiac Cath -   Sleep Study -  CPAP -   Fasting Blood Sugar -  Checks Blood Sugar _____ times a day  Blood Thinner Instructions: Aspirin Instructions: Last Dose:  Anesthesia review: Pt advised to hold Nilotnib (chemotherapy) 1 week prior to surgery by surgeon's office.   Patient denies shortness of breath, fever, cough and chest pain at PAT appointment   Patient verbalized understanding of instructions that were given to them at the PAT appointment. Patient was also instructed that they will need to review over the PAT instructions again at home before surgery.

## 2019-10-18 ENCOUNTER — Other Ambulatory Visit: Payer: Self-pay

## 2019-10-18 ENCOUNTER — Encounter (HOSPITAL_COMMUNITY)
Admission: RE | Admit: 2019-10-18 | Discharge: 2019-10-18 | Disposition: A | Discharge status: Home / Self Care | Payer: Medicare Other | Source: Ambulatory Visit | Attending: Orthopedic Surgery | Admitting: Orthopedic Surgery

## 2019-10-18 ENCOUNTER — Other Ambulatory Visit (HOSPITAL_COMMUNITY)
Admission: RE | Admit: 2019-10-18 | Discharge: 2019-10-18 | Disposition: A | Discharge status: Home / Self Care | Payer: Medicare Other | Source: Ambulatory Visit | Attending: Orthopedic Surgery | Admitting: Orthopedic Surgery

## 2019-10-18 ENCOUNTER — Encounter (HOSPITAL_COMMUNITY): Payer: Self-pay

## 2019-10-18 DIAGNOSIS — Z20828 Contact with and (suspected) exposure to other viral communicable diseases: Secondary | ICD-10-CM | POA: Insufficient documentation

## 2019-10-18 DIAGNOSIS — Z01812 Encounter for preprocedural laboratory examination: Secondary | ICD-10-CM | POA: Insufficient documentation

## 2019-10-18 DIAGNOSIS — M1711 Unilateral primary osteoarthritis, right knee: Secondary | ICD-10-CM | POA: Diagnosis not present

## 2019-10-18 LAB — CBC WITH DIFFERENTIAL/PLATELET
Abs Immature Granulocytes: 0.15 10*3/uL — ABNORMAL HIGH (ref 0.00–0.07)
Basophils Absolute: 0.1 10*3/uL (ref 0.0–0.1)
Basophils Relative: 0 %
Eosinophils Absolute: 0.1 10*3/uL (ref 0.0–0.5)
Eosinophils Relative: 1 %
HCT: 40.2 % (ref 36.0–46.0)
Hemoglobin: 12.1 g/dL (ref 12.0–15.0)
Immature Granulocytes: 1 %
Lymphocytes Relative: 11 %
Lymphs Abs: 1.6 10*3/uL (ref 0.7–4.0)
MCH: 28 pg (ref 26.0–34.0)
MCHC: 30.1 g/dL (ref 30.0–36.0)
MCV: 93.1 fL (ref 80.0–100.0)
Monocytes Absolute: 1 10*3/uL (ref 0.1–1.0)
Monocytes Relative: 6 %
Neutro Abs: 12.2 10*3/uL — ABNORMAL HIGH (ref 1.7–7.7)
Neutrophils Relative %: 81 %
Platelets: 328 10*3/uL (ref 150–400)
RBC: 4.32 MIL/uL (ref 3.87–5.11)
RDW: 14 % (ref 11.5–15.5)
WBC: 15.1 10*3/uL — ABNORMAL HIGH (ref 4.0–10.5)
nRBC: 0 % (ref 0.0–0.2)

## 2019-10-18 LAB — COMPREHENSIVE METABOLIC PANEL
ALT: 19 U/L (ref 0–44)
AST: 24 U/L (ref 15–41)
Albumin: 4.1 g/dL (ref 3.5–5.0)
Alkaline Phosphatase: 163 U/L — ABNORMAL HIGH (ref 38–126)
Anion gap: 10 (ref 5–15)
BUN: 22 mg/dL (ref 8–23)
CO2: 27 mmol/L (ref 22–32)
Calcium: 9.3 mg/dL (ref 8.9–10.3)
Chloride: 104 mmol/L (ref 98–111)
Creatinine, Ser: 0.9 mg/dL (ref 0.44–1.00)
GFR calc Af Amer: >60 mL/min (ref >60)
GFR calc non Af Amer: >60 mL/min (ref >60)
Glucose, Bld: 109 mg/dL — ABNORMAL HIGH (ref 70–99)
Potassium: 4.5 mmol/L (ref 3.5–5.1)
Sodium: 141 mmol/L (ref 135–145)
Total Bilirubin: 0.8 mg/dL (ref 0.3–1.2)
Total Protein: 7.9 g/dL (ref 6.5–8.1)

## 2019-10-18 LAB — APTT: aPTT: 30 seconds (ref 24–36)

## 2019-10-18 LAB — PROTIME-INR
INR: 1 (ref 0.8–1.2)
Prothrombin Time: 13.4 seconds (ref 11.4–15.2)

## 2019-10-18 LAB — ABO/RH: ABO/RH(D): A POS

## 2019-10-18 LAB — SURGICAL PCR SCREEN
MRSA, PCR: NEGATIVE
Staphylococcus aureus: NEGATIVE

## 2019-10-19 LAB — NOVEL CORONAVIRUS, NAA (HOSP ORDER, SEND-OUT TO REF LAB; TAT 18-24 HRS): SARS-CoV-2, NAA: NOT DETECTED

## 2019-10-19 NOTE — H&P (Signed)
TOTAL KNEE ADMISSION H&P  Patient is being admitted for right total knee arthroplasty.  Subjective:  Chief Complaint:right knee pain.  HPI: Andrea Martinez, 73 y.o. female, has a history of pain and functional disability in the right knee due to arthritis and has failed non-surgical conservative treatments for greater than 12 weeks to includeNSAID's and/or analgesics, corticosteriod injections, use of assistive devices and activity modification.  Onset of symptoms was gradual, starting 5 years ago with gradually worsening course since that time. The patient noted no past surgery on the right knee(s).  Patient currently rates pain in the right knee(s) at 7 out of 10 with activity. Patient has night pain, worsening of pain with activity and weight bearing, pain that interferes with activities of daily living, pain with passive range of motion, crepitus and joint swelling.  Patient has evidence of periarticular osteophytes and joint space narrowing by imaging studies. There is no active infection.  Patient Active Problem List   Diagnosis Date Noted  . Thoracic compression fracture ([place]) 02/14/2019  . Sweating abnormality 10/11/2017  . Iron deficiency anemia 02/16/2017  . Pleural effusion 10/23/2016  . Solitary pulmonary nodule 10/23/2016  . Anemia 11/21/2015  . Bladder prolapse, female, acquired 11/21/2015  . IBS (irritable bowel syndrome) 11/21/2015  . Neutrophilic leukocytosis 0000000  . OA (osteoarthritis) of knee 11/21/2015  . CML (chronic myeloid leukemia) ([place]) 11/20/2015  . Biliary dyskinesia 12/04/2013  . Dyspepsia 11/30/2012  . Rib pain 11/01/2011  . Colon cancer screening 09/21/2011  . Lipoma 07/27/2011  . Lipoma of arm 07/27/2011  . Mass of arm 07/08/2011  . PUD 05/20/2010  . ABDOMINAL PAIN RIGHT UPPER QUADRANT 02/12/2010  . Hypothyroidism 02/11/2010  . NAUSEA 02/11/2010  . UTI'S, HX OF 02/11/2010   Past Medical History:  Diagnosis Date  . Arthritis   . 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 Gastric ulcer 2011   EGD, 5/11  . Hypothyroidism    not on meds, followed by Dr. Elyse Hsu  . Leukocytosis 11/20/2015  . Lipoma    left upper arm  . Pericardial effusion    a. HCAP complicated by pericardial effusion requiring pericardial window 10/2016 and large L pleural effusion requring VATS.  Marland Kitchen Pleural effusion    a. s/p VATS 2017.  Marland Kitchen Pneumonia 10/2016  . PONV (postoperative nausea and vomiting)   . Prolonged QT interval   . Urine incontinence   . UTI (lower urinary tract infection)    frequent    Past Surgical History:  Procedure Laterality Date  . BRAVO [place] STUDY  12/04/2012   Procedure: BRAVO [place];  Surgeon: [name], MD;  [facility];  Service: Endoscopy;  Laterality: N/A;  . CHOLECYSTECTOMY N/A 01/03/2014   Procedure: LAPAROSCOPIC CHOLECYSTECTOMY WITH INTRAOPERATIVE CHOLANGIOGRAM;  Surgeon: Joyice Faster. Cornett, MD;  Location: [place];  Service: General;  Laterality: N/A;  . COLONOSCOPY  01/2004   DR [facility], few small tics  . ESOPHAGOGASTRODUODENOSCOPY  02/2010   gastric ulcers  . ESOPHAGOGASTRODUODENOSCOPY  12/04/2012   [id number] gastritis (inflammation) was found in the gastric antrum; multiple biopsies The duodenal mucosa showed no abnormalities in the bulb and second portion of the duodenum  . KYPHOPLASTY N/A 02/14/2019   Procedure: KYPHOPLASTY T12 and L1;  Surgeon: Melina Schools, MD;  Location: [place];  Service: Orthopedics;  Laterality: N/A;  120 mins  . LIPOMA EXCISION  08/02/2011   left shoulder  . NOSE SURGERY    . PARTIAL HYSTERECTOMY  vaginal at age 59 years of age  . TOE DEBRIDEMENT Left 1962   lt great toe  . VIDEO ASSISTED THORACOSCOPY (VATS)/EMPYEMA Left 10/25/2016   Procedure: VIDEO ASSISTED THORACOSCOPY (VATS), BRONCH,DRAINAGE OF PLEURAL EFFUSION,PERICARDIAL WINDOW WITH DRAINAGE OF PERICARDIAL FLUID, TEE;  Surgeon: [name], MD;  Location:  [place];  Service: Thoracic;  Laterality: Left;  Marland Kitchen VIDEO BRONCHOSCOPY N/A 10/25/2016   Procedure: VIDEO BRONCHOSCOPY;  Surgeon: [name], MD;  Location: [facility] OR;  Service: Thoracic;  Laterality: N/A;       Current Outpatient Medications  Medication Sig Dispense Refill Last Dose  . cetirizine (ZYRTEC) 10 MG tablet Take 10 mg by mouth daily.      . Cholecalciferol (VITAMIN D) 50 MCG (2000 UT) tablet TAKE 1 TABLET (2,000 UNITS TOTAL) BY MOUTH DAILY WITH BREAKFAST. (Patient not taking: No sig reported) 90 tablet 0   . escitalopram (LEXAPRO) 20 MG tablet Take 1.5 tablets (30 mg total) by mouth daily. 60 tablet 5   . lactose free nutrition (BOOST) LIQD Take 237 mLs by mouth daily.     Marland Kitchen levothyroxine (SYNTHROID) 125 MCG tablet Take 1 tablet (125 mcg total) by mouth daily before breakfast. 90 tablet 1   . Multiple Vitamins-Minerals (CENTRUM SILVER PO) Take 1 capsule by mouth daily.     . nilotinib (TASIGNA) 150 MG capsule Take 2 capsules (300 mg total) by mouth every 12 (twelve) hours. (Patient taking differently: Take 300 mg by mouth every 12 (twelve) hours. ) 120 capsule 6   . Probiotic CAPS Take 2 capsules by mouth daily.     . vitamin B-12 (CYANOCOBALAMIN) 1000 MCG tablet Take 1,000 mcg by mouth daily.      . Vitamin E 180 MG CAPS Take 360 mg by mouth daily.      . traMADol (ULTRAM) 50 MG tablet Take 1 tablet (50 mg total) by mouth 2 (two) times daily as needed. (Patient not taking: Reported on 10/10/2019) 30 tablet 0 Not Taking at Unknown time   Allergies  Allergen Reactions  . Codeine Nausea Only and Other (See Comments)    Headache  . Sulfonamide Derivatives Nausea Only and Other (See Comments)    Headache  . Esomeprazole Magnesium Nausea Only  . Fluconazole Rash    Redness and blistering on left thigh    Social History   Tobacco Use  . Smoking status: Never Smoker  . Smokeless tobacco: Never Used  Substance Use Topics  . Alcohol use: No    Alcohol/week: 0.0 standard  drinks    Family History  Problem Relation Age of Onset  . Lung cancer Father   . COPD Mother   . Colon cancer Neg Hx   . Anesthesia problems Neg Hx   . Hypotension Neg Hx   . Malignant hyperthermia Neg Hx   . Pseudochol deficiency Neg Hx      Review of Systems  Constitutional: Negative.   HENT: Negative.   Eyes: Negative.   Respiratory: Negative.   Cardiovascular: Negative.   Gastrointestinal: Negative.   Genitourinary: Positive for frequency. Negative for dysuria, flank pain, hematuria and urgency.  Musculoskeletal: Positive for back pain, joint pain and myalgias. Negative for falls and neck pain.  Skin: Negative.   Neurological: Negative.   Endo/Heme/Allergies: Negative.   Psychiatric/Behavioral: Negative.     Objective:  Physical Exam  Constitutional: She is oriented to person, place, and time. She appears well-developed. No distress.  Morbidly obese  HENT:  Head: Normocephalic and atraumatic.  Right Ear: External ear normal.  Left Ear: External ear normal.  Nose: Nose normal.  Mouth/Throat: Oropharynx is clear and moist.  Eyes: Conjunctivae and EOM are normal.  Neck: Normal range of motion. Neck supple.  Cardiovascular: Normal rate, regular rhythm, normal heart sounds and intact distal pulses.  No murmur heard. Respiratory: Effort normal and breath sounds normal. No respiratory distress. She has no wheezes.  GI: Soft. Bowel sounds are normal. She exhibits no distension. There is no abdominal tenderness.  Musculoskeletal:     Comments: Musculoskeletal Bilateral Hip Exam: ROM: is normal without discomfort. There is no tenderness over the greater trochanter bursa.  Right Knee Exam: No effusion. No Swelling. Range of motion is 5-125 degrees. No crepitus on range of motion of the knee. Significant medial, greater than lateral, joint line tenderness Stable knee.  Left Knee Exam: No effusion. No Swelling. Range of motion is 5-125 degrees. No crepitus on  range of motion of the knee. Mild medial joint line tenderness No lateral joint line tenderness. Stable knee.  Neurological: She is alert and oriented to person, place, and time. She has normal strength. No sensory deficit.  Skin: No rash noted. She is not diaphoretic. No erythema.  Psychiatric: She has a normal mood and affect. Her behavior is normal.    Vital signs in last 24 hours: Temp:  [98.3 F (36.8 C)] 98.3 F (36.8 C) (11/05 1028) Pulse Rate:  [73] 73 (11/05 1028) Resp:  [18] 18 (11/05 1028) BP: (160)/(68) 160/68 (11/05 1028) SpO2:  [97 %] 97 % (11/05 1028) Weight:  [101.4 kg] 101.4 kg (11/05 1028)  Labs:   Estimated body mass index is 36.07 kg/m as calculated from the following:   Height as of 10/18/19: 5\' 6"  (1.676 m).   Weight as of 10/18/19: 101.4 kg.   Imaging Review Plain radiographs demonstrate severe degenerative joint disease of the right knee(s). The overall alignment ismild varus. The bone quality appears to be good for age and reported activity level.    Assessment/Plan:  End stage primary osteoarthritis, right knee   The patient history, physical examination, clinical judgment of the provider and imaging studies are consistent with end stage degenerative joint disease of the right knee(s) and total knee arthroplasty is deemed medically necessary. The treatment options including medical management, injection therapy arthroscopy and arthroplasty were discussed at length. The risks and benefits of total knee arthroplasty were presented and reviewed. The risks due to aseptic loosening, infection, stiffness, patella tracking problems, thromboembolic complications and other imponderables were discussed. The patient acknowledged the explanation, agreed to proceed with the plan and consent was signed. Patient is being admitted for inpatient treatment for surgery, pain control, PT, OT, prophylactic antibiotics, VTE prophylaxis, progressive ambulation and ADL's and  discharge planning. The patient is planning to be discharged home.    Anticipated LOS equal to or greater than 2 midnights due to - Age 3 and older with one or more of the following:  - Obesity  - Expected need for hospital services (PT, OT, Nursing) required for safe  discharge  - Anticipated need for postoperative skilled nursing care or inpatient rehab  - Active co-morbidities: CML OR   - Unanticipated findings during/Post Surgery: None  - Patient is a high risk of re-admission due to: None    Risks and benefits of the surgery were discussed with the patient and Dr.Aluisio at their previous office visit, and the patient has elected to move forward with the aforementioned surgery. Post-operative care plans  were discussed with the patient today.  Therapy Plans: outpatient therapy at [facility] Disposition: Home with husband Planned DVT prophylaxis: Xarelto 10mg  daily (hx of cancer) DME needed: has walker and elevated commone seat PCP: Dr. Luan Pulling Cardio: Dr. Roxan Hockey Oncology: Dr. Delton Coombes  Other: history of severe nausea/vomiting with anesthesia  Instructed patient on meds to stop prior to surgery  Ardeen Jourdain, PA-C

## 2019-10-21 MED ORDER — BUPIVACAINE LIPOSOME 1.3 % IJ SUSP
20.0000 mL | Freq: Once | INTRAMUSCULAR | Status: DC
  Filled 2019-10-21: qty 20

## 2019-10-22 ENCOUNTER — Ambulatory Visit (HOSPITAL_COMMUNITY): Payer: Medicare Other | Admitting: Physician Assistant

## 2019-10-22 ENCOUNTER — Encounter (HOSPITAL_COMMUNITY)
Admission: RE | Disposition: A | Discharge status: Home / Self Care | Payer: Self-pay | Source: Home / Self Care | Attending: Orthopedic Surgery

## 2019-10-22 ENCOUNTER — Other Ambulatory Visit: Payer: Self-pay

## 2019-10-22 ENCOUNTER — Ambulatory Visit (HOSPITAL_COMMUNITY)
Admission: RE | Admit: 2019-10-22 | Discharge: 2019-10-23 | Disposition: A | Discharge status: Home / Self Care | Payer: Medicare Other | Attending: Orthopedic Surgery | Admitting: Orthopedic Surgery

## 2019-10-22 ENCOUNTER — Ambulatory Visit (HOSPITAL_COMMUNITY): Payer: Medicare Other | Admitting: Certified Registered Nurse Anesthetist

## 2019-10-22 ENCOUNTER — Encounter (HOSPITAL_COMMUNITY): Payer: Self-pay | Admitting: *Deleted

## 2019-10-22 DIAGNOSIS — Z79899 Other long term (current) drug therapy: Secondary | ICD-10-CM | POA: Diagnosis not present

## 2019-10-22 DIAGNOSIS — E039 Hypothyroidism, unspecified: Secondary | ICD-10-CM | POA: Insufficient documentation

## 2019-10-22 DIAGNOSIS — Z79890 Hormone replacement therapy (postmenopausal): Secondary | ICD-10-CM | POA: Insufficient documentation

## 2019-10-22 DIAGNOSIS — Z885 Allergy status to narcotic agent status: Secondary | ICD-10-CM | POA: Diagnosis not present

## 2019-10-22 DIAGNOSIS — Z882 Allergy status to sulfonamides status: Secondary | ICD-10-CM | POA: Insufficient documentation

## 2019-10-22 DIAGNOSIS — Z6836 Body mass index (BMI) 36.0-36.9, adult: Secondary | ICD-10-CM | POA: Diagnosis not present

## 2019-10-22 DIAGNOSIS — Z883 Allergy status to other anti-infective agents status: Secondary | ICD-10-CM | POA: Insufficient documentation

## 2019-10-22 DIAGNOSIS — M1710 Unilateral primary osteoarthritis, unspecified knee: Secondary | ICD-10-CM | POA: Diagnosis present

## 2019-10-22 DIAGNOSIS — M1711 Unilateral primary osteoarthritis, right knee: Secondary | ICD-10-CM | POA: Insufficient documentation

## 2019-10-22 DIAGNOSIS — J90 Pleural effusion, not elsewhere classified: Secondary | ICD-10-CM | POA: Diagnosis not present

## 2019-10-22 DIAGNOSIS — M179 Osteoarthritis of knee, unspecified: Secondary | ICD-10-CM

## 2019-10-22 DIAGNOSIS — Z856 Personal history of leukemia: Secondary | ICD-10-CM | POA: Diagnosis not present

## 2019-10-22 DIAGNOSIS — G8918 Other acute postprocedural pain: Secondary | ICD-10-CM | POA: Diagnosis not present

## 2019-10-22 HISTORY — PX: TOTAL KNEE ARTHROPLASTY: SHX125

## 2019-10-22 HISTORY — PX: JOINT REPLACEMENT: SHX530

## 2019-10-22 LAB — TYPE AND SCREEN
ABO/RH(D): A POS
Antibody Screen: NEGATIVE

## 2019-10-22 SURGERY — ARTHROPLASTY, KNEE, TOTAL
Anesthesia: Spinal | Site: Knee | Laterality: Right

## 2019-10-22 MED ORDER — BUPIVACAINE IN DEXTROSE 0.75-8.25 % IT SOLN
INTRATHECAL | Status: DC | PRN
  Administered 2019-10-22: 1.6 mL via INTRATHECAL

## 2019-10-22 MED ORDER — ACETAMINOPHEN 10 MG/ML IV SOLN
1000.0000 mg | Freq: Four times a day (QID) | INTRAVENOUS | Status: DC
  Administered 2019-10-22: 11:00:00 1000 mg via INTRAVENOUS
  Filled 2019-10-22 (×2): qty 100

## 2019-10-22 MED ORDER — SODIUM CHLORIDE 0.9 % IR SOLN
Status: DC | PRN
  Administered 2019-10-22: 1000 mL

## 2019-10-22 MED ORDER — LEVOTHYROXINE SODIUM 125 MCG PO TABS
125.0000 ug | ORAL_TABLET | Freq: Every day | ORAL | Status: DC
  Administered 2019-10-23: 07:00:00 125 ug via ORAL
  Filled 2019-10-22: qty 1

## 2019-10-22 MED ORDER — MENTHOL 3 MG MT LOZG: 1.0000 | LOZENGE | OROMUCOSAL | Status: DC | PRN

## 2019-10-22 MED ORDER — BUPIVACAINE LIPOSOME 1.3 % IJ SUSP
INTRAMUSCULAR | Status: DC | PRN
  Administered 2019-10-22: 20 mL

## 2019-10-22 MED ORDER — DEXAMETHASONE SODIUM PHOSPHATE 10 MG/ML IJ SOLN
INTRAMUSCULAR | Status: AC
  Filled 2019-10-22: qty 1

## 2019-10-22 MED ORDER — ACETAMINOPHEN 160 MG/5ML PO SOLN: 325.0000 mg | Freq: Once | ORAL | Status: DC | PRN

## 2019-10-22 MED ORDER — FENTANYL CITRATE (PF) 100 MCG/2ML IJ SOLN
INTRAMUSCULAR | Status: AC
  Administered 2019-10-22: 10:00:00 25 ug
  Filled 2019-10-22: qty 2

## 2019-10-22 MED ORDER — DEXAMETHASONE SODIUM PHOSPHATE 10 MG/ML IJ SOLN
INTRAMUSCULAR | Status: DC | PRN
  Administered 2019-10-22: 6 mg via INTRAVENOUS

## 2019-10-22 MED ORDER — SODIUM CHLORIDE (PF) 0.9 % IJ SOLN
INTRAMUSCULAR | Status: AC
  Filled 2019-10-22: qty 10

## 2019-10-22 MED ORDER — DEXAMETHASONE SODIUM PHOSPHATE 10 MG/ML IJ SOLN
10.0000 mg | Freq: Once | INTRAMUSCULAR | Status: AC
  Administered 2019-10-23: 10 mg via INTRAVENOUS
  Filled 2019-10-22: qty 1

## 2019-10-22 MED ORDER — PROPOFOL 500 MG/50ML IV EMUL
INTRAVENOUS | Status: DC | PRN
  Administered 2019-10-22: 50 ug/kg/min via INTRAVENOUS
  Administered 2019-10-22: 11:00:00 via INTRAVENOUS

## 2019-10-22 MED ORDER — FLEET ENEMA 7-19 GM/118ML RE ENEM: 1.0000 | ENEMA | Freq: Once | RECTAL | Status: DC | PRN

## 2019-10-22 MED ORDER — SODIUM CHLORIDE 0.9 % IV SOLN
INTRAVENOUS | Status: DC
  Administered 2019-10-22: 15:00:00 via INTRAVENOUS

## 2019-10-22 MED ORDER — PROPOFOL 10 MG/ML IV BOLUS
INTRAVENOUS | Status: AC
  Filled 2019-10-22: qty 20

## 2019-10-22 MED ORDER — EPHEDRINE 5 MG/ML INJ
INTRAVENOUS | Status: AC
  Filled 2019-10-22: qty 10

## 2019-10-22 MED ORDER — HYDROMORPHONE HCL 1 MG/ML IJ SOLN
INTRAMUSCULAR | Status: AC
  Filled 2019-10-22: qty 1

## 2019-10-22 MED ORDER — METHOCARBAMOL 500 MG IVPB - SIMPLE MED
500.0000 mg | Freq: Four times a day (QID) | INTRAVENOUS | Status: DC | PRN
  Administered 2019-10-22: 13:00:00 500 mg via INTRAVENOUS
  Filled 2019-10-22: qty 50

## 2019-10-22 MED ORDER — MORPHINE SULFATE (PF) 2 MG/ML IV SOLN
1.0000 mg | INTRAVENOUS | Status: DC | PRN
  Administered 2019-10-22 (×2): 1 mg via INTRAVENOUS
  Filled 2019-10-22 (×2): qty 1

## 2019-10-22 MED ORDER — NILOTINIB HCL 150 MG PO CAPS: 300.0000 mg | ORAL_CAPSULE | Freq: Two times a day (BID) | ORAL | Status: DC

## 2019-10-22 MED ORDER — PHENYLEPHRINE 40 MCG/ML (10ML) SYRINGE FOR IV PUSH (FOR BLOOD PRESSURE SUPPORT)
PREFILLED_SYRINGE | INTRAVENOUS | Status: DC | PRN
  Administered 2019-10-22: 120 ug via INTRAVENOUS

## 2019-10-22 MED ORDER — ACETAMINOPHEN 500 MG PO TABS
1000.0000 mg | ORAL_TABLET | Freq: Four times a day (QID) | ORAL | Status: AC
  Administered 2019-10-22 – 2019-10-23 (×4): 1000 mg via ORAL
  Filled 2019-10-22 (×5): qty 2

## 2019-10-22 MED ORDER — PROPOFOL 500 MG/50ML IV EMUL
INTRAVENOUS | Status: AC
  Filled 2019-10-22: qty 50

## 2019-10-22 MED ORDER — ACETAMINOPHEN 325 MG PO TABS: 325.0000 mg | ORAL_TABLET | Freq: Once | ORAL | Status: DC | PRN

## 2019-10-22 MED ORDER — OXYCODONE HCL 5 MG PO TABS
10.0000 mg | ORAL_TABLET | ORAL | Status: DC | PRN
  Administered 2019-10-22 – 2019-10-23 (×3): 10 mg via ORAL
  Filled 2019-10-22 (×3): qty 2

## 2019-10-22 MED ORDER — EPHEDRINE SULFATE-NACL 50-0.9 MG/10ML-% IV SOSY
PREFILLED_SYRINGE | INTRAVENOUS | Status: DC | PRN
  Administered 2019-10-22 (×5): 5 mg via INTRAVENOUS

## 2019-10-22 MED ORDER — PROPOFOL 10 MG/ML IV BOLUS
INTRAVENOUS | Status: DC | PRN
  Administered 2019-10-22: 20 mg via INTRAVENOUS
  Administered 2019-10-22: 10 mg via INTRAVENOUS
  Administered 2019-10-22: 20 mg via INTRAVENOUS
  Administered 2019-10-22: 10 mg via INTRAVENOUS
  Administered 2019-10-22: 20 mg via INTRAVENOUS
  Administered 2019-10-22: 10 mg via INTRAVENOUS

## 2019-10-22 MED ORDER — LACTATED RINGERS IV SOLN
INTRAVENOUS | Status: DC
  Administered 2019-10-22 (×2): via INTRAVENOUS

## 2019-10-22 MED ORDER — METOCLOPRAMIDE HCL 5 MG PO TABS: 5.0000 mg | ORAL_TABLET | Freq: Three times a day (TID) | ORAL | Status: DC | PRN

## 2019-10-22 MED ORDER — ROPIVACAINE HCL 7.5 MG/ML IJ SOLN
INTRAMUSCULAR | Status: DC | PRN
  Administered 2019-10-22: 20 mL via PERINEURAL

## 2019-10-22 MED ORDER — MEPERIDINE HCL 50 MG/ML IJ SOLN: 6.2500 mg | INTRAMUSCULAR | Status: DC | PRN

## 2019-10-22 MED ORDER — BISACODYL 10 MG RE SUPP: 10.0000 mg | Freq: Every day | RECTAL | Status: DC | PRN

## 2019-10-22 MED ORDER — ONDANSETRON HCL 4 MG/2ML IJ SOLN
INTRAMUSCULAR | Status: AC
  Filled 2019-10-22: qty 2

## 2019-10-22 MED ORDER — PHENYLEPHRINE HCL (PRESSORS) 10 MG/ML IV SOLN
INTRAVENOUS | Status: AC
  Filled 2019-10-22: qty 1

## 2019-10-22 MED ORDER — OXYCODONE HCL 5 MG PO TABS
5.0000 mg | ORAL_TABLET | ORAL | Status: DC | PRN
  Administered 2019-10-22 – 2019-10-23 (×3): 5 mg via ORAL
  Filled 2019-10-22 (×3): qty 1

## 2019-10-22 MED ORDER — METOCLOPRAMIDE HCL 5 MG/ML IJ SOLN: 5.0000 mg | Freq: Three times a day (TID) | INTRAMUSCULAR | Status: DC | PRN

## 2019-10-22 MED ORDER — SODIUM CHLORIDE (PF) 0.9 % IJ SOLN
INTRAMUSCULAR | Status: DC | PRN
  Administered 2019-10-22: 60 mL

## 2019-10-22 MED ORDER — LACTATED RINGERS IV SOLN: INTRAVENOUS | Status: DC

## 2019-10-22 MED ORDER — CHLORHEXIDINE GLUCONATE 4 % EX LIQD: 60.0000 mL | Freq: Once | CUTANEOUS | Status: DC

## 2019-10-22 MED ORDER — PHENOL 1.4 % MT LIQD: 1.0000 | OROMUCOSAL | Status: DC | PRN

## 2019-10-22 MED ORDER — GABAPENTIN 100 MG PO CAPS
200.0000 mg | ORAL_CAPSULE | Freq: Three times a day (TID) | ORAL | Status: DC
  Administered 2019-10-22 – 2019-10-23 (×4): 200 mg via ORAL
  Filled 2019-10-22 (×5): qty 2

## 2019-10-22 MED ORDER — HYDROMORPHONE HCL 1 MG/ML IJ SOLN
0.2500 mg | INTRAMUSCULAR | Status: DC | PRN
  Administered 2019-10-22 (×4): 0.5 mg via INTRAVENOUS

## 2019-10-22 MED ORDER — ONDANSETRON HCL 4 MG/2ML IJ SOLN: 4.0000 mg | Freq: Four times a day (QID) | INTRAMUSCULAR | Status: DC | PRN

## 2019-10-22 MED ORDER — DIPHENHYDRAMINE HCL 12.5 MG/5ML PO ELIX: 12.5000 mg | ORAL_SOLUTION | ORAL | Status: DC | PRN

## 2019-10-22 MED ORDER — SODIUM CHLORIDE (PF) 0.9 % IJ SOLN
INTRAMUSCULAR | Status: AC
  Filled 2019-10-22: qty 50

## 2019-10-22 MED ORDER — POVIDONE-IODINE 10 % EX SWAB
2.0000 "application " | Freq: Once | CUTANEOUS | Status: AC
  Administered 2019-10-22: 2 via TOPICAL

## 2019-10-22 MED ORDER — CEFAZOLIN SODIUM-DEXTROSE 2-4 GM/100ML-% IV SOLN
2.0000 g | INTRAVENOUS | Status: AC
  Administered 2019-10-22: 10:00:00 2 g via INTRAVENOUS
  Filled 2019-10-22: qty 100

## 2019-10-22 MED ORDER — POLYETHYLENE GLYCOL 3350 17 G PO PACK: 17.0000 g | PACK | Freq: Every day | ORAL | Status: DC | PRN

## 2019-10-22 MED ORDER — ESCITALOPRAM OXALATE 20 MG PO TABS
30.0000 mg | ORAL_TABLET | Freq: Every day | ORAL | Status: DC
  Administered 2019-10-23: 09:00:00 30 mg via ORAL
  Filled 2019-10-22: qty 1

## 2019-10-22 MED ORDER — RIVAROXABAN 10 MG PO TABS
10.0000 mg | ORAL_TABLET | Freq: Every day | ORAL | Status: DC
  Administered 2019-10-23: 09:00:00 10 mg via ORAL
  Filled 2019-10-22: qty 1

## 2019-10-22 MED ORDER — ONDANSETRON HCL 4 MG/2ML IJ SOLN
INTRAMUSCULAR | Status: DC | PRN
  Administered 2019-10-22: 4 mg via INTRAVENOUS

## 2019-10-22 MED ORDER — METHOCARBAMOL 500 MG PO TABS
500.0000 mg | ORAL_TABLET | Freq: Four times a day (QID) | ORAL | Status: DC | PRN
  Administered 2019-10-22 – 2019-10-23 (×4): 500 mg via ORAL
  Filled 2019-10-22 (×4): qty 1

## 2019-10-22 MED ORDER — ACETAMINOPHEN 10 MG/ML IV SOLN: 1000.0000 mg | Freq: Once | INTRAVENOUS | Status: DC | PRN

## 2019-10-22 MED ORDER — DEXAMETHASONE SODIUM PHOSPHATE 10 MG/ML IJ SOLN: 8.0000 mg | Freq: Once | INTRAMUSCULAR | Status: DC

## 2019-10-22 MED ORDER — ONDANSETRON HCL 4 MG PO TABS: 4.0000 mg | ORAL_TABLET | Freq: Four times a day (QID) | ORAL | Status: DC | PRN

## 2019-10-22 MED ORDER — DOCUSATE SODIUM 100 MG PO CAPS
100.0000 mg | ORAL_CAPSULE | Freq: Two times a day (BID) | ORAL | Status: DC
  Administered 2019-10-22 – 2019-10-23 (×2): 100 mg via ORAL
  Filled 2019-10-22 (×2): qty 1

## 2019-10-22 MED ORDER — TRANEXAMIC ACID-NACL 1000-0.7 MG/100ML-% IV SOLN
1000.0000 mg | INTRAVENOUS | Status: AC
  Administered 2019-10-22: 1000 mg via INTRAVENOUS
  Filled 2019-10-22: qty 100

## 2019-10-22 MED ORDER — METHOCARBAMOL 500 MG IVPB - SIMPLE MED
INTRAVENOUS | Status: AC
  Filled 2019-10-22: qty 50

## 2019-10-22 SURGICAL SUPPLY — 60 items
BAG SPEC THK2 15X12 ZIP CLS (MISCELLANEOUS) ×1
BAG ZIPLOCK 12X15 (MISCELLANEOUS) ×3 IMPLANT
BLADE SAG 18X100X1.27 (BLADE) ×3 IMPLANT
BLADE SAW SGTL 11.0X1.19X90.0M (BLADE) ×3 IMPLANT
BLADE SURG SZ10 CARB STEEL (BLADE) ×6 IMPLANT
BNDG ELASTIC 6X5.8 VLCR STR LF (GAUZE/BANDAGES/DRESSINGS) ×3 IMPLANT
BOWL SMART MIX CTS (DISPOSABLE) ×3 IMPLANT
CEMENT HV SMART SET (Cement) ×6 IMPLANT
CEMENT TIBIA MBT (Knees) ×1 IMPLANT
CLOSURE WOUND 1/2 X4 (GAUZE/BANDAGES/DRESSINGS) ×1
COVER SURGICAL LIGHT HANDLE (MISCELLANEOUS) ×3 IMPLANT
COVER WAND RF STERILE (DRAPES) IMPLANT
CUFF TOURN SGL QUICK 34 (TOURNIQUET CUFF) ×2
CUFF TRNQT CYL 34X4.125X (TOURNIQUET CUFF) ×1 IMPLANT
DECANTER SPIKE VIAL GLASS SM (MISCELLANEOUS) ×3 IMPLANT
DRAPE U-SHAPE 47X51 STRL (DRAPES) ×3 IMPLANT
DRSG ADAPTIC 3X8 NADH LF (GAUZE/BANDAGES/DRESSINGS) ×3 IMPLANT
DRSG PAD ABDOMINAL 8X10 ST (GAUZE/BANDAGES/DRESSINGS) ×3 IMPLANT
DURAPREP 26ML APPLICATOR (WOUND CARE) ×3 IMPLANT
ELECT REM PT RETURN 15FT ADLT (MISCELLANEOUS) ×3 IMPLANT
EVACUATOR 1/8 PVC DRAIN (DRAIN) ×3 IMPLANT
FEMUR SIGMA PS SZ 4.0N R (Femur) ×3 IMPLANT
GAUZE SPONGE 4X4 12PLY STRL (GAUZE/BANDAGES/DRESSINGS) ×3 IMPLANT
GLOVE BIO SURGEON STRL SZ7 (GLOVE) ×3 IMPLANT
GLOVE BIO SURGEON STRL SZ8 (GLOVE) ×3 IMPLANT
GLOVE BIOGEL PI IND STRL 6.5 (GLOVE) ×1 IMPLANT
GLOVE BIOGEL PI IND STRL 7.0 (GLOVE) ×1 IMPLANT
GLOVE BIOGEL PI IND STRL 8 (GLOVE) ×1 IMPLANT
GLOVE BIOGEL PI INDICATOR 6.5 (GLOVE) ×2
GLOVE BIOGEL PI INDICATOR 7.0 (GLOVE) ×2
GLOVE BIOGEL PI INDICATOR 8 (GLOVE) ×2
GLOVE SURG SS PI 6.5 STRL IVOR (GLOVE) ×3 IMPLANT
GOWN STRL REUS W/TWL LRG LVL3 (GOWN DISPOSABLE) ×9 IMPLANT
HANDPIECE INTERPULSE COAX TIP (DISPOSABLE) ×2
HOLDER FOLEY CATH W/STRAP (MISCELLANEOUS) IMPLANT
IMMOBILIZER KNEE 20 (SOFTGOODS) ×3
IMMOBILIZER KNEE 20 THIGH 36 (SOFTGOODS) ×1 IMPLANT
IMMOBILIZER KNEE 22  40 CIR (ORTHOPEDIC SUPPLIES) ×2
IMMOBILIZER KNEE 22 40 CIR (ORTHOPEDIC SUPPLIES) ×1 IMPLANT
KIT TURNOVER KIT A (KITS) IMPLANT
MANIFOLD NEPTUNE II (INSTRUMENTS) ×3 IMPLANT
NS IRRIG 1000ML POUR BTL (IV SOLUTION) ×3 IMPLANT
PACK TOTAL KNEE CUSTOM (KITS) ×3 IMPLANT
PADDING CAST COTTON 6X4 STRL (CAST SUPPLIES) ×3 IMPLANT
PATELLA DOME PFC 35MM (Knees) ×3 IMPLANT
PIN STEINMAN FIXATION KNEE (PIN) ×3 IMPLANT
PLATE ROT INSERT 12.5MM SIZE 4 (Plate) ×3 IMPLANT
PROTECTOR NERVE ULNAR (MISCELLANEOUS) ×3 IMPLANT
SET HNDPC FAN SPRY TIP SCT (DISPOSABLE) ×1 IMPLANT
STRIP CLOSURE SKIN 1/2X4 (GAUZE/BANDAGES/DRESSINGS) ×2 IMPLANT
SUT MNCRL AB 4-0 PS2 18 (SUTURE) ×3 IMPLANT
SUT STRATAFIX 0 PDS 27 VIOLET (SUTURE) ×3
SUT VIC AB 2-0 CT1 27 (SUTURE) ×6
SUT VIC AB 2-0 CT1 TAPERPNT 27 (SUTURE) ×3 IMPLANT
SUTURE STRATFX 0 PDS 27 VIOLET (SUTURE) ×1 IMPLANT
TIBIA MBT CEMENT (Knees) ×3 IMPLANT
TRAY FOLEY MTR SLVR 16FR STAT (SET/KITS/TRAYS/PACK) ×3 IMPLANT
WATER STERILE IRR 1000ML POUR (IV SOLUTION) ×6 IMPLANT
WRAP KNEE MAXI GEL POST OP (GAUZE/BANDAGES/DRESSINGS) ×3 IMPLANT
YANKAUER SUCT BULB TIP 10FT TU (MISCELLANEOUS) ×3 IMPLANT

## 2019-10-22 NOTE — Anesthesia Procedure Notes (Signed)
Anesthesia Regional Block: Adductor canal block   Pre-Anesthetic Checklist: ,, timeout performed, Correct Patient, Correct Site, Correct Laterality, Correct Procedure, Correct Position, site marked, Risks and benefits discussed,  Surgical consent,  Pre-op evaluation,  At surgeon's request and post-op pain management  Laterality: Right  Prep: chloraprep       Needles:  Injection technique: Single-shot  Needle Type: Echogenic Stimulator Needle     Needle Length: 9cm  Needle Gauge: 21     Additional Needles:   Procedures:,,,, ultrasound used (permanent image in chart),,,,  Narrative:  Start time: 10/22/2019 9:30 AM End time: 10/22/2019 9:40 AM Injection made incrementally with aspirations every 5 mL.  Performed by: Personally  Anesthesiologist: Effie Berkshire, MD  Additional Notes: Patient tolerated the procedure well. Local anesthetic introduced in an incremental fashion under minimal resistance after negative aspirations. No paresthesias were elicited. After completion of the procedure, no acute issues were identified and patient continued to be monitored by RN.

## 2019-10-22 NOTE — Progress Notes (Signed)
Assisted Dr. Hollis with right, ultrasound guided, adductor canal block. Side rails up, monitors on throughout procedure. See vital signs in flow sheet. Tolerated Procedure well.  

## 2019-10-22 NOTE — Anesthesia Procedure Notes (Signed)
Spinal  Patient location during procedure: OR Start time: 10/22/2019 10:14 AM End time: 10/22/2019 10:18 AM Staffing Anesthesiologist: Effie Berkshire, MD Resident/CRNA: Raenette Rover, CRNA Performed: resident/CRNA  Preanesthetic Checklist Completed: patient identified, site marked, surgical consent, pre-op evaluation, timeout performed, IV checked, risks and benefits discussed and monitors and equipment checked Spinal Block Patient position: sitting Prep: DuraPrep Patient monitoring: blood pressure, continuous pulse ox and heart rate Approach: midline Location: L3-4 Injection technique: single-shot Needle Needle type: Pencan  Needle gauge: 24 G Assessment Sensory level: T6

## 2019-10-22 NOTE — Anesthesia Preprocedure Evaluation (Addendum)
Anesthesia Evaluation  Patient identified by MRN, date of birth, ID band Patient awake    Reviewed: Allergy & Precautions, NPO status , Patient's Chart, lab work & pertinent test results  History of Anesthesia Complications (+) PONV and history of anesthetic complications  Airway Mallampati: II  TM Distance: >3 FB Neck ROM: Full    Dental  (+) Teeth Intact, Dental Advisory Given   Pulmonary neg pulmonary ROS,    breath sounds clear to auscultation       Cardiovascular negative cardio ROS   Rhythm:Regular Rate:Normal     Neuro/Psych negative neurological ROS  negative psych ROS   GI/Hepatic Neg liver ROS, PUD,   Endo/Other  Hypothyroidism   Renal/GU negative Renal ROS     Musculoskeletal  (+) Arthritis ,   Abdominal (+) + obese,   Peds  Hematology negative hematology ROS (+)   Anesthesia Other Findings   Reproductive/Obstetrics                            Lab Results  Component Value Date   WBC 15.1 (H) 10/18/2019   HGB 12.1 10/18/2019   HCT 40.2 10/18/2019   MCV 93.1 10/18/2019   PLT 328 10/18/2019   Lab Results  Component Value Date   CREATININE 0.90 10/18/2019   BUN 22 10/18/2019   NA 141 10/18/2019   K 4.5 10/18/2019   CL 104 10/18/2019   CO2 27 10/18/2019   Lab Results  Component Value Date   INR 1.0 10/18/2019   INR 1.17 10/25/2016   INR 1.15 10/24/2016     Anesthesia Physical Anesthesia Plan  ASA: II  Anesthesia Plan: Spinal   Post-op Pain Management:  Regional for Post-op pain   Induction: Intravenous  PONV Risk Score and Plan: 4 or greater and Ondansetron, Propofol infusion, Treatment may vary due to age or medical condition and Dexamethasone  Airway Management Planned: Natural Airway and Simple Face Mask  Additional Equipment: None  Intra-op Plan:   Post-operative Plan:   Informed Consent: I have reviewed the patients History and Physical,  chart, labs and discussed the procedure including the risks, benefits and alternatives for the proposed anesthesia with the patient or authorized representative who has indicated his/her understanding and acceptance.       Plan Discussed with: CRNA  Anesthesia Plan Comments:       Anesthesia Quick Evaluation

## 2019-10-22 NOTE — Discharge Instructions (Addendum)
° °Dr. Frank Aluisio °Total Joint Specialist °[facility] °[address] °[place], [place] [address] °[phone] ° °TOTAL KNEE REPLACEMENT POSTOPERATIVE DIRECTIONS ° °Knee Rehabilitation, Guidelines Following Surgery  °Results after knee surgery are often greatly improved when you follow the exercise, range of motion and muscle strengthening exercises prescribed by your doctor. Safety measures are also important to protect the knee from further injury. Any time any of these exercises cause you to have increased pain or swelling in your knee joint, decrease the amount until you are comfortable again and slowly increase them. If you have problems or questions, call your caregiver or physical therapist for advice.  ° °HOME CARE INSTRUCTIONS  °Remove items at home which could result in a fall. This includes throw rugs or furniture in walking pathways.  °· ICE to the affected knee every three hours for 30 minutes at a time and then as needed for pain and swelling.  Continue to use ice on the knee for pain and swelling from surgery. You may notice swelling that will progress down to the foot and ankle.  This is normal after surgery.  Elevate the leg when you are not up walking on it.   °· Continue to use the breathing machine which will help keep your temperature down.  It is common for your temperature to cycle up and down following surgery, especially at night when you are not up moving around and exerting yourself.  The breathing machine keeps your lungs expanded and your temperature down. °· Do not place pillow under knee, focus on keeping the knee straight while resting ° °DIET °You may resume your previous home diet once your are discharged from the hospital. ° °DRESSING / WOUND CARE / SHOWERING °You may shower 3 days after surgery, but keep the wounds dry during showering.  You may use an occlusive plastic wrap (Press'n Seal for example), NO SOAKING/SUBMERGING IN THE BATHTUB.  If the bandage gets  wet, change with a clean dry gauze.  If the incision gets wet, pat the wound dry with a clean towel. °You may start showering once you are discharged home but do not submerge the incision under water. Just pat the incision dry and apply a dry gauze dressing on daily. °Change the surgical dressing daily and reapply a dry dressing each time. ° °ACTIVITY °Walk with your walker as instructed. °Use walker as long as suggested by your caregivers. °Avoid periods of inactivity such as sitting longer than an hour when not asleep. This helps prevent blood clots.  °You may resume a sexual relationship in one month or when given the OK by your doctor.  °You may return to work once you are cleared by your doctor.  °Do not drive a car for 6 weeks or until released by you surgeon.  °Do not drive while taking narcotics. ° °WEIGHT BEARING °Weight bearing as tolerated with assist device (walker, cane, etc) as directed, use it as long as suggested by your surgeon or therapist, typically at least 4-6 weeks. ° °POSTOPERATIVE CONSTIPATION PROTOCOL °Constipation - defined medically as fewer than three stools per week and severe constipation as less than one stool per week. ° °One of the most common issues patients have following surgery is constipation.  Even if you have a regular bowel pattern at home, your normal regimen is likely to be disrupted due to multiple reasons following surgery.  Combination of anesthesia, postoperative narcotics, change in appetite and fluid intake all can affect your bowels.    In order to avoid complications following surgery, here are some recommendations in order to help you during your recovery period.  Colace (docusate) - Pick up an over-the-counter form of Colace or another stool softener and take twice a day as long as you are requiring postoperative pain medications.  Take with a full glass of water daily.  If you experience loose stools or diarrhea, hold the colace until you stool forms back up.  If  your symptoms do not get better within 1 week or if they get worse, check with your doctor.  Dulcolax (bisacodyl) - Pick up over-the-counter and take as directed by the product packaging as needed to assist with the movement of your bowels.  Take with a full glass of water.  Use this product as needed if not relieved by Colace only.   MiraLax (polyethylene glycol) - Pick up over-the-counter to have on hand.  MiraLax is a solution that will increase the amount of water in your bowels to assist with bowel movements.  Take as directed and can mix with a glass of water, juice, soda, coffee, or tea.  Take if you go more than two days without a movement. Do not use MiraLax more than once per day. Call your doctor if you are still constipated or irregular after using this medication for 7 days in a row.  If you continue to have problems with postoperative constipation, please contact the office for further assistance and recommendations.  If you experience "the worst abdominal pain ever" or develop nausea or vomiting, please contact the office immediatly for further recommendations for treatment.  ITCHING  If you experience itching with your medications, try taking only a single pain pill, or even half a pain pill at a time.  You can also use Benadryl over the counter for itching or also to help with sleep.   TED HOSE STOCKINGS Wear the elastic stockings on both legs for three weeks following surgery during the day but you may remove then at night for sleeping.  MEDICATIONS See your medication summary on the After Visit Summary that the nursing staff will review with you prior to discharge.  You may have some home medications which will be placed on hold until you complete the course of blood thinner medication.  It is important for you to complete the blood thinner medication as prescribed by your surgeon.  Continue your approved medications as instructed at time of discharge.  Gabapentin 200 mg  Protocol Take a 200 mg capsule three times a day for two weeks, Then a 200 mg capsule twice a day for two weeks, Then a 200 mg capsule once a day for two weeks, then discontinue the Gabapentin.  PRECAUTIONS If you experience chest pain or shortness of breath - call 911 immediately for transfer to the hospital emergency department.  If you develop a fever greater that 101 F, purulent drainage from wound, increased redness or drainage from wound, foul odor from the wound/dressing, or calf pain - CONTACT YOUR SURGEON.                                                   FOLLOW-UP APPOINTMENTS Make sure you keep all of your appointments after your operation with your surgeon and caregivers. You should call the office at the above phone number and make an appointment for  approximately two weeks after the date of your surgery or on the date instructed by your surgeon outlined in the "After Visit Summary". ° ° °RANGE OF MOTION AND STRENGTHENING EXERCISES  °Rehabilitation of the knee is important following a knee injury or an operation. After just a few days of immobilization, the muscles of the thigh which control the knee become weakened and shrink (atrophy). Knee exercises are designed to build up the tone and strength of the thigh muscles and to improve knee motion. Often times heat used for twenty to thirty minutes before working out will loosen up your tissues and help with improving the range of motion but do not use heat for the first two weeks following surgery. These exercises can be done on a training (exercise) mat, on the floor, on a table or on a bed. Use what ever works the best and is most comfortable for you Knee exercises include:  °Leg Lifts - While your knee is still immobilized in a splint or cast, you can do straight leg raises. Lift the leg to 60 degrees, hold for 3 sec, and slowly lower the leg. Repeat 10-20 times 2-3 times daily. Perform this exercise against resistance later as your knee  gets better.  °Quad and Hamstring Sets - Tighten up the muscle on the front of the thigh (Quad) and hold for 5-10 sec. Repeat this 10-20 times hourly. Hamstring sets are done by pushing the foot backward against an object and holding for 5-10 sec. Repeat as with quad sets.  °· Leg Slides: Lying on your back, slowly slide your foot toward your buttocks, bending your knee up off the floor (only go as far as is comfortable). Then slowly slide your foot back down until your leg is flat on the floor again. °· Angel Wings: Lying on your back spread your legs to the side as far apart as you can without causing discomfort.  °A rehabilitation program following serious knee injuries can speed recovery and prevent re-injury in the future due to weakened muscles. Contact your doctor or a physical therapist for more information on knee rehabilitation.  ° °IF YOU ARE TRANSFERRED TO A SKILLED REHAB FACILITY °If the patient is transferred to a skilled rehab facility following release from the hospital, a list of the current medications will be sent to the facility for the patient to continue.  When discharged from the skilled rehab facility, please have the facility set up the patient's Home Health Physical Therapy prior to being released. Also, the skilled facility will be responsible for providing the patient with their medications at time of release from the facility to include their pain medication, the muscle relaxants, and their blood thinner medication. If the patient is still at the rehab facility at time of the two week follow up appointment, the skilled rehab facility will also need to assist the patient in arranging follow up appointment in our office and any transportation needs. ° °MAKE SURE YOU:  °Understand these instructions.  °Get help right away if you are not doing well or get worse.  ° ° °Pick up stool softner and laxative for home use following surgery while on pain medications. °Do not submerge incision under  water. °Please use good hand washing techniques while changing dressing each day. °May shower starting three days after surgery. °Please use a clean towel to pat the incision dry following showers. °Continue to use ice for pain and swelling after surgery. °Do not use any lotions or creams on the   incision until instructed by your surgeon.  Information on my medicine - XARELTO (Rivaroxaban)   Why was Xarelto prescribed for you? Xarelto was prescribed for you to reduce the risk of blood clots forming after orthopedic surgery. The medical term for these abnormal blood clots is venous thromboembolism (VTE).  What do you need to know about xarelto ? Take your Xarelto ONCE DAILY at the same time every day. You may take it either with or without food.  If you have difficulty swallowing the tablet whole, you may crush it and mix in applesauce just prior to taking your dose.  Take Xarelto exactly as prescribed by your doctor and DO NOT stop taking Xarelto without talking to the doctor who prescribed the medication.  Stopping without other VTE prevention medication to take the place of Xarelto may increase your risk of developing a clot.  After discharge, you should have regular check-up appointments with your healthcare provider that is prescribing your Xarelto.    What do you do if you miss a dose? If you miss a dose, take it as soon as you remember on the same day then continue your regularly scheduled once daily regimen the next day. Do not take two doses of Xarelto on the same day.   Important Safety Information A possible side effect of Xarelto is bleeding. You should call your healthcare provider right away if you experience any of the following: ? Bleeding from an injury or your nose that does not stop. ? Unusual colored urine (red or dark brown) or unusual colored stools (red or black). ? Unusual bruising for unknown reasons. ? A serious fall or if you hit your head (even if there  is no bleeding).  Some medicines may interact with Xarelto and might increase your risk of bleeding while on Xarelto. To help avoid this, consult your healthcare provider or pharmacist prior to using any new prescription or non-prescription medications, including herbals, vitamins, non-steroidal anti-inflammatory drugs (NSAIDs) and supplements.  This website has more information on Xarelto: [email or web address]

## 2019-10-22 NOTE — Care Plan (Signed)
Ortho Bundle Case Management Note  Patient Details  Name: Andrea Martinez MRN: [id number] Date of Birth: November 10, 1946  R TKA on 10-22-19 DCP:  Home with spouse.  1 story home with 1 ste. DME:  No needs.  Has a RW and elevated toilets.  Does not need a 3-in-1. PT:  [facility].  PT eval scheduled on 10-26-19.                   DME Arranged:  N/A DME Agency:  NA  HH Arranged:  NA HH Agency:  NA  Additional Comments: Please contact me with any questions of if this plan should need to change.  Marianne Sofia, RN,CCM [facility]  [phone] 10/22/2019, 1:39 PM

## 2019-10-22 NOTE — Evaluation (Signed)
Physical Therapy Evaluation Patient Details Name: Andrea Martinez MRN: [id number] DOB: 08/27/1946 Today's Date: 10/22/2019   History of Present Illness  Patient is a 73 y.o. female s/p Rt TKA on 10/22/19 with PMH significant for T12-L1 kyphoplasty, CML, hypothyroidism, and OA.    Clinical Impression  Andrea Martinez is a 73 y.o. female POD 0 s/p Rt TKA. Patient reports modified independence with RW for mobility at baseline; and reports she requires assist from her spouse for LB dressing and ADL's. Patient is now limited by functional impairments (see PT problem list below) and requires min assist for transfers and gait with RW. Patient was able to ambulate ~80 feet with RW and min assist to steady. Patient instructed in exercise to facilitate circulation. Patient will benefit from continued skilled PT interventions to address impairments and progress towards PLOF. Acute PT will follow to progress mobility and stair training in preparation for safe discharge home.    Follow Up Recommendations Follow surgeon's recommendation for DC plan and follow-up therapies    Equipment Recommendations  None recommended by PT    Recommendations for Other Services       Precautions / Restrictions Precautions Precautions: Fall Restrictions Weight Bearing Restrictions: No      Mobility  Bed Mobility Overal bed mobility: Needs Assistance Bed Mobility: Supine to Sit     Supine to sit: Min assist;HOB elevated     General bed mobility comments: cues for use of bed rails and to scoot legs laterally to bring to EOB, assist required for Rt LE  mobility  Transfers Overall transfer level: Needs assistance Equipment used: Rolling walker (2 wheeled) Transfers: Sit to/from Stand Sit to Stand: From elevated surface;Mod assist         General transfer comment: cues for safe hand placement and technique to initaite power up, assist required to begin power up and complete rise to  stand  Ambulation/Gait Ambulation/Gait assistance: Min assist Gait Distance (Feet): 80 Feet Assistive device: Rolling walker (2 wheeled) Gait Pattern/deviations: Step-through pattern;Decreased step length - right;Decreased step length - left;Decreased stride length;Decreased stance time - right;Wide base of support Gait velocity: decreased   General Gait Details: pt unsteady with gait and assist required to prevent LOB, cues at start for safe step pattern and to maintain safe proximity to RW. pt became diaphoretic at end of gait and slightly light headed after seated rest provided.  Stairs            Wheelchair Mobility    Modified Rankin (Stroke Patients Only)       Balance Overall balance assessment: Needs assistance Sitting-balance support: Feet supported;No upper extremity supported Sitting balance-Leahy Scale: Good     Standing balance support: Bilateral upper extremity supported Standing balance-Leahy Scale: Poor                Pertinent Vitals/Pain Pain Assessment: 0-10 Pain Score: 8  Pain Location: Rt knee Pain Descriptors / Indicators: Aching;Sore Pain Intervention(s): Limited activity within patient's tolerance;Monitored during session;Repositioned;Ice applied;RN gave pain meds during session    [place] expects to be discharged to:: Private residence Living Arrangements: Spouse/significant other Available Help at Discharge: Family;Available PRN/intermittently Type of Home: House Home Access: Stairs to enter Entrance Stairs-Rails: None Entrance Stairs-Number of Steps: 1 Home Layout: Laundry or work area in basement;Able to live on main level with bedroom/bathroom;One level Home Equipment: Prosser - 2 wheels;Bedside commode Additional Comments: pt and her husband are planning to get grab bars    Prior Function  Level of Independence: Needs assistance   Gait / Transfers Assistance Needed: pt using RW and SPC to get around, her  husband helps her get up steps  ADL's / Homemaking Assistance Needed: pt's husand helps her to get dressed and to help her bathe  Comments: pt is still helping to cook, and clean while using her RW to get around. She does not drive. She has been sleep in recliner recently.     Hand Dominance   Dominant Hand: Right    Extremity/Trunk Assessment   Upper Extremity Assessment Upper Extremity Assessment: Overall WFL for tasks assessed    Lower Extremity Assessment Lower Extremity Assessment: Generalized weakness;RLE deficits/detail RLE Deficits / Details: poor quad activaiton in supine, knee immobilizer donned RLE: Unable to fully assess due to immobilization RLE Sensation: WNL RLE Coordination: WNL    Cervical / Trunk Assessment Cervical / Trunk Assessment: Normal  Communication   Communication: No difficulties  Cognition Arousal/Alertness: Awake/alert Behavior During Therapy: WFL for tasks assessed/performed Overall Cognitive Status: Within Functional Limits for tasks assessed         General Comments      Exercises Total Joint Exercises Ankle Circles/Pumps: AROM;15 reps;Seated;Both   Assessment/Plan    PT Assessment Patient needs continued PT services  PT Problem List Decreased strength;Decreased range of motion;Decreased activity tolerance;Decreased mobility;Decreased balance;Decreased knowledge of use of DME       PT Treatment Interventions DME instruction;Balance training;Functional mobility training;Therapeutic activities;Gait training;Stair training;Therapeutic exercise;Patient/family education;Modalities    PT Goals (Current goals can be found in the Care Plan section)  Acute Rehab PT Goals Patient Stated Goal: back to being more independent PT Goal Formulation: With patient Time For Goal Achievement: 10/29/19 Potential to Achieve Goals: Good    Frequency 7X/week    AM-PAC PT "6 Clicks" Mobility  Outcome Measure Help needed turning from your back to  your side while in a flat bed without using bedrails?: A Little Help needed moving from lying on your back to sitting on the side of a flat bed without using bedrails?: A Little Help needed moving to and from a bed to a chair (including a wheelchair)?: A Little Help needed standing up from a chair using your arms (e.g., wheelchair or bedside chair)?: A Little Help needed to walk in hospital room?: A Little Help needed climbing 3-5 steps with a railing? : A Little 6 Click Score: 18    End of Session Equipment Utilized During Treatment: Gait belt Activity Tolerance: Patient tolerated treatment well Patient left: in chair;with call bell/phone within reach;with chair alarm set Nurse Communication: Mobility status PT Visit Diagnosis: Difficulty in walking, not elsewhere classified (R26.2);Muscle weakness (generalized) (M62.81)    Time: [id number] PT Time Calculation (min) (ACUTE ONLY): 33 min   Charges:   PT Evaluation $PT Eval Low Complexity: 1 Low PT Treatments $Gait Training: 8-22 mins        Kipp Brood, PT, DPT Physical Therapist with [facility]  10/22/2019 6:41 PM

## 2019-10-22 NOTE — Interval H&P Note (Signed)
History and Physical Interval Note:  10/22/2019 8:24 AM  Andrea Martinez  has presented today for surgery, with the diagnosis of right knee osteoarthritis.  The various methods of treatment have been discussed with the patient and family. After consideration of risks, benefits and other options for treatment, the patient has consented to  Procedure(s) with comments: TOTAL KNEE ARTHROPLASTY (Right) - 22min as a surgical intervention.  The patient's history has been reviewed, patient examined, no change in status, stable for surgery.  I have reviewed the patient's chart and labs.  Questions were answered to the patient's satisfaction.     Pilar Plate Montrell Cessna

## 2019-10-22 NOTE — Op Note (Signed)
OPERATIVE REPORT-TOTAL KNEE ARTHROPLASTY   Pre-operative diagnosis- Osteoarthritis  Right knee(s)  Post-operative diagnosis- Osteoarthritis Right knee(s)  Procedure-  Right  Total Knee Arthroplasty  Surgeon- Andrea Plover. Oluwafemi Villella, MD  Assistant- Molli Barrows, PA-C   Anesthesia-  Adductor canal block and spinal  EBL- 25 ml   Drains Hemovac  Tourniquet time-  Total Tourniquet Time Documented: Thigh (Right) - 45 minutes Total: Thigh (Right) - 45 minutes     Complications- None  Condition-PACU - hemodynamically stable.   Brief Clinical Note  Andrea Martinez is a 73 y.o. year old female with end stage OA of his right knee with progressively worsening pain and dysfunction. He has constant pain, with activity and at rest and significant functional deficits with difficulties even with ADLs. He has had extensive non-op management including analgesics, injections of cortisone and viscosupplements, and home exercise program, but remains in significant pain with significant dysfunction. Radiographs show bone on bone arthritis medial and patellofemoral. He presents now for right Total Knee Arthroplasty.    Procedure in detail---   The patient is brought into the operating room and positioned supine on the operating table. After successful administration of  Adductor canal block and spinal,   a tourniquet is placed high on the  Right thigh(s) and the lower extremity is prepped and draped in the usual sterile fashion. Time out is performed by the operating team and then the  Right lower extremity is wrapped in Esmarch, knee flexed and the tourniquet inflated to 300 mmHg.       A midline incision is made with a ten blade through the subcutaneous tissue to the level of the extensor mechanism. A fresh blade is used to make a medial parapatellar arthrotomy. Soft tissue over the proximal medial tibia is subperiosteally elevated to the joint line with a knife and into the semimembranosus bursa with a Cobb  elevator. Soft tissue over the proximal lateral tibia is elevated with attention being paid to avoiding the patellar tendon on the tibial tubercle. The patella is everted, knee flexed 90 degrees and the ACL and PCL are removed. Findings are bone on bone medial and patellofemoral with massive global osteophytes        The drill is used to create a starting hole in the distal femur and the canal is thoroughly irrigated with sterile saline to remove the fatty contents. The 5 degree Right  valgus alignment guide is placed into the femoral canal and the distal femoral cutting block is pinned to remove 10 mm off the distal femur. Resection is made with an oscillating saw.      The tibia is subluxed forward and the menisci are removed. The extramedullary alignment guide is placed referencing proximally at the medial aspect of the tibial tubercle and distally along the second metatarsal axis and tibial crest. The block is pinned to remove 56mm off the more deficient medial side. Resection is made with an oscillating saw. Size 3is the most appropriate size for the tibia and the proximal tibia is prepared with the modular drill and keel punch for that size.      The femoral sizing guide is placed and size 4 is most appropriate. Rotation is marked off the epicondylar axis and confirmed by creating a rectangular flexion gap at 90 degrees. The size 4 cutting block is pinned in this rotation and the anterior, posterior and chamfer cuts are made with the oscillating saw. The intercondylar block is then placed and that cut is made.  Trial size 3 tibial component, trial size 4 narrow posterior stabilized femur and a 12.5  mm posterior stabilized rotating platform insert trial is placed. Full extension is achieved with excellent varus/valgus and anterior/posterior balance throughout full range of motion. The patella is everted and thickness measured to be 22  mm. Free hand resection is taken to 12 mm, a 35 template is placed,  lug holes are drilled, trial patella is placed, and it tracks normally. Osteophytes are removed off the posterior femur with the trial in place. All trials are removed and the cut bone surfaces prepared with pulsatile lavage. Cement is mixed and once ready for implantation, the size 3 tibial implant, size  4 narrow posterior stabilized femoral component, and the size 35 patella are cemented in place and the patella is held with the clamp. The trial insert is placed and the knee held in full extension. The Exparel (20 ml mixed with 60 ml saline) is injected into the extensor mechanism, posterior capsule, medial and lateral gutters and subcutaneous tissues.  All extruded cement is removed and once the cement is hard the permanent 12.5 mm posterior stabilized rotating platform insert is placed into the tibial tray.      The wound is copiously irrigated with saline solution and the extensor mechanism closed over a hemovac drain with #1 V-loc suture. The tourniquet is released for a total tourniquet time of 44  minutes. Flexion against gravity is 130 degrees and the patella tracks normally. Subcutaneous tissue is closed with 2.0 vicryl and subcuticular with running 4.0 Monocryl. The incision is cleaned and dried and steri-strips and a bulky sterile dressing are applied. The limb is placed into a knee immobilizer and the patient is awakened and transported to recovery in stable condition.      Please note that a surgical assistant was a medical necessity for this procedure in order to perform it in a safe and expeditious manner. Surgical assistant was necessary to retract the ligaments and vital neurovascular structures to prevent injury to them and also necessary for proper positioning of the limb to allow for anatomic placement of the prosthesis.   Andrea Plover Andrea Schrodt, MD    10/22/2019, 11:28 AM

## 2019-10-22 NOTE — Transfer of Care (Signed)
Immediate Anesthesia Transfer of Care Note  Patient: Andrea Martinez  Procedure(s) Performed: TOTAL KNEE ARTHROPLASTY (Right Knee)  Patient Location: PACU  Anesthesia Type:Regional and Spinal  Level of Consciousness: awake, alert  and oriented  Airway & Oxygen Therapy: Patient Spontanous Breathing and Patient connected to nasal cannula oxygen  Post-op Assessment: Report given to RN and Post -op Vital signs reviewed and stable  Post vital signs: Reviewed and stable  Last Vitals:  Vitals Value Taken Time  BP 141/61 10/22/19 1155  Temp    Pulse 75 10/22/19 1158  Resp 23 10/22/19 1158  SpO2 98 % 10/22/19 1158  Vitals shown include unvalidated device data.  Last Pain:  Vitals:   10/22/19 0953  TempSrc:   PainSc: 0-No pain      Patients Stated Pain Goal: 3 ([id number])  Complications: No apparent anesthesia complications

## 2019-10-23 ENCOUNTER — Encounter (HOSPITAL_COMMUNITY): Payer: Self-pay | Admitting: Orthopedic Surgery

## 2019-10-23 DIAGNOSIS — Z856 Personal history of leukemia: Secondary | ICD-10-CM | POA: Diagnosis not present

## 2019-10-23 DIAGNOSIS — Z885 Allergy status to narcotic agent status: Secondary | ICD-10-CM | POA: Diagnosis not present

## 2019-10-23 DIAGNOSIS — Z882 Allergy status to sulfonamides status: Secondary | ICD-10-CM | POA: Diagnosis not present

## 2019-10-23 DIAGNOSIS — Z883 Allergy status to other anti-infective agents status: Secondary | ICD-10-CM | POA: Diagnosis not present

## 2019-10-23 DIAGNOSIS — Z79899 Other long term (current) drug therapy: Secondary | ICD-10-CM | POA: Diagnosis not present

## 2019-10-23 DIAGNOSIS — E039 Hypothyroidism, unspecified: Secondary | ICD-10-CM | POA: Diagnosis not present

## 2019-10-23 DIAGNOSIS — M1711 Unilateral primary osteoarthritis, right knee: Secondary | ICD-10-CM | POA: Diagnosis not present

## 2019-10-23 LAB — BASIC METABOLIC PANEL
Anion gap: 6 (ref 5–15)
BUN: 20 mg/dL (ref 8–23)
CO2: 26 mmol/L (ref 22–32)
Calcium: 8.5 mg/dL — ABNORMAL LOW (ref 8.9–10.3)
Chloride: 107 mmol/L (ref 98–111)
Creatinine, Ser: 0.87 mg/dL (ref 0.44–1.00)
GFR calc Af Amer: >60 mL/min (ref >60)
GFR calc non Af Amer: >60 mL/min (ref >60)
Glucose, Bld: 132 mg/dL — ABNORMAL HIGH (ref 70–99)
Potassium: 5.3 mmol/L — ABNORMAL HIGH (ref 3.5–5.1)
Sodium: 139 mmol/L (ref 135–145)

## 2019-10-23 LAB — CBC
HCT: 33 % — ABNORMAL LOW (ref 36.0–46.0)
Hemoglobin: 9.8 g/dL — ABNORMAL LOW (ref 12.0–15.0)
MCH: 28.3 pg (ref 26.0–34.0)
MCHC: 29.7 g/dL — ABNORMAL LOW (ref 30.0–36.0)
MCV: 95.4 fL (ref 80.0–100.0)
Platelets: 234 10*3/uL (ref 150–400)
RBC: 3.46 MIL/uL — ABNORMAL LOW (ref 3.87–5.11)
RDW: 13.7 % (ref 11.5–15.5)
WBC: 15.4 10*3/uL — ABNORMAL HIGH (ref 4.0–10.5)
nRBC: 0 % (ref 0.0–0.2)

## 2019-10-23 MED ORDER — OXYCODONE HCL 5 MG PO TABS: 5.0000 mg | ORAL_TABLET | Freq: Four times a day (QID) | ORAL | 0 refills | Status: DC | PRN

## 2019-10-23 MED ORDER — GABAPENTIN 100 MG PO CAPS: 200.0000 mg | ORAL_CAPSULE | Freq: Three times a day (TID) | ORAL | 0 refills | Status: DC

## 2019-10-23 MED ORDER — METHOCARBAMOL 500 MG PO TABS: 500.0000 mg | ORAL_TABLET | Freq: Four times a day (QID) | ORAL | 0 refills | Status: DC | PRN

## 2019-10-23 MED ORDER — RIVAROXABAN 10 MG PO TABS: 10.0000 mg | ORAL_TABLET | Freq: Every day | ORAL | 0 refills | Status: DC

## 2019-10-23 NOTE — Anesthesia Postprocedure Evaluation (Signed)
Anesthesia Post Note  Patient: Andrea Martinez  Procedure(s) Performed: TOTAL KNEE ARTHROPLASTY (Right Knee)     Patient location during evaluation: PACU Anesthesia Type: Spinal Level of consciousness: oriented and awake and alert Pain management: pain level controlled Vital Signs Assessment: post-procedure vital signs reviewed and stable Respiratory status: spontaneous breathing, respiratory function stable and patient connected to nasal cannula oxygen Cardiovascular status: blood pressure returned to baseline and stable Postop Assessment: no headache, no backache, no apparent nausea or vomiting and spinal receding Anesthetic complications: no    Last Vitals:  Vitals:   10/23/19 0114 10/23/19 0606  BP: (!) 118/57 135/63  Pulse: (!) 57 60  Resp: 14 16  Temp: (!) 36.4 C (!) 36.4 C  SpO2: 100% 100%    Last Pain:  Vitals:   10/23/19 0606  TempSrc: Oral  PainSc: 0-No pain                 Effie Berkshire

## 2019-10-23 NOTE — Progress Notes (Signed)
Physical Therapy Treatment Patient Details Name: Andrea Martinez MRN: [id number] DOB: December 07, 1946 Today's Date: 10/23/2019    History of Present Illness Patient is a 73 y.o. female s/p Rt TKA on 10/22/19 with PMH significant for T12-L1 kyphoplasty, CML, hypothyroidism, and OA.    PT Comments    Patient received up in chair, pleasant and willing to work with therapy but extremely talkative and often talking over therapist during session requiring multiple and repeated cues from PT (at one time therapist had to repeat cues for safety 4 times because patient kept talking over her). Able to complete functional transfers with MinA but on two occasions had significant LOB requiring Mod-MaxA from therapist to maintain safety and prevent fall. Noted moderate knee buckling even with stand-pivot transfers to and from bedside commode. KI was applied in chair totalA, continued to note slight knee buckling even with KI applied. Introduced IT trainer, patient again constantly talking over therapist and required multiple cues this afternoon but able to perform stair training with MinA for RW management and balance. Continued gait training in hallway with cues as noted below, patient adamantly refusing chair follow for safety even in face of high pain levels this afternoon. She was left up in the chair with chair alarm active, all needs otherwise met and spouse present this afternoon.     Follow Up Recommendations  Follow surgeon's recommendation for DC plan and follow-up therapies     Equipment Recommendations  None recommended by PT    Recommendations for Other Services       Precautions / Restrictions Precautions Precautions: Fall Restrictions Weight Bearing Restrictions: No Other Position/Activity Restrictions: WBAT    Mobility  Bed Mobility               General bed mobility comments: OOB in chair  Transfers Overall transfer level: Needs assistance Equipment used: Rolling walker (2  wheeled) Transfers: Sit to/from [facility] Sit to Stand: Min assist Stand pivot transfers: Min assist       General transfer comment: MinA for functional transfers, on multiple occasions once standing she required Mod-MaxA to prevent a fall due to sudden LOB after coming to standing. Moderate R knee buckling when not in knee immobilizer  Ambulation/Gait Ambulation/Gait assistance: Min assist Gait Distance (Feet): 100 Feet Assistive device: Rolling walker (2 wheeled) Gait Pattern/deviations: Step-to pattern;Decreased stride length;Wide base of support;Trunk flexed Gait velocity: decreased   General Gait Details: intermittent MinA for safety and balance, slight R knee buckling even in KI. Cues for safety and use of RW. Often talking over and only intermittently following therapist cues.   Stairs Stairs: Yes Stairs assistance: Min assist Stair Management: No rails;With walker Number of Stairs: 1 General stair comments: backwards ascent/forwards descent with RW, MinA for RW management and balance, cues for safety/sequencing with patient again often talking over therapist and needing cues repeated multiple times before she would follow them   Wheelchair Mobility    Modified Rankin (Stroke Patients Only)       Balance Overall balance assessment: Needs assistance Sitting-balance support: Feet supported;No upper extremity supported Sitting balance-Leahy Scale: Good     Standing balance support: Bilateral upper extremity supported Standing balance-Leahy Scale: Poor Standing balance comment: Mod-MaxA to maintain balance with sudden LOB during session                            Cognition Arousal/Alertness: Awake/alert Behavior During Therapy: [facility] for tasks assessed/performed Overall Cognitive  Status: Within Functional Limits for tasks assessed                                 General Comments: very talkative and often talking over  therapist when PT trying to give education/cues for gait and stair navigation, poor awareness of deficits      Exercises    General Comments        Pertinent Vitals/Pain Pain Assessment: Faces Pain Score: 5  Faces Pain Scale: Hurts even more Pain Location: R knee Pain Descriptors / Indicators: Aching;Sore Pain Intervention(s): Limited activity within patient's tolerance;Monitored during session    Home Living                      Prior Function            PT Goals (current goals can now be found in the care plan section) Acute Rehab PT Goals Patient Stated Goal: back to being more independent PT Goal Formulation: With patient Time For Goal Achievement: 10/29/19 Potential to Achieve Goals: Good Progress towards PT goals: Progressing toward goals    Frequency    7X/week      PT Plan Current plan remains appropriate    Co-evaluation              AM-PAC PT "6 Clicks" Mobility   Outcome Measure  Help needed turning from your back to your side while in a flat bed without using bedrails?: A Little Help needed moving from lying on your back to sitting on the side of a flat bed without using bedrails?: A Little Help needed moving to and from a bed to a chair (including a wheelchair)?: A Little Help needed standing up from a chair using your arms (e.g., wheelchair or bedside chair)?: A Little Help needed to walk in hospital room?: A Little Help needed climbing 3-5 steps with a railing? : A Little 6 Click Score: 18    End of Session Equipment Utilized During Treatment: Gait belt Activity Tolerance: Patient tolerated treatment well Patient left: in chair;with call bell/phone within reach;with family/visitor present;with chair alarm set   PT Visit Diagnosis: Difficulty in walking, not elsewhere classified (R26.2);Muscle weakness (generalized) (M62.81)     Time: 2751-7001 PT Time Calculation (min) (ACUTE ONLY): 35 min  Charges:  $Gait Training: 8-22  mins  $Therapeutic Activity: 8-22 mins                     Windell Norfolk, DPT, CBIS  Supplemental Physical Therapist [place]    Pager [phone] Acute Rehab Office [phone]

## 2019-10-23 NOTE — Progress Notes (Signed)
Physical Therapy Treatment Patient Details Name: Andrea Martinez MRN: [id number] DOB: 1946-03-21 Today's Date: 10/23/2019    History of Present Illness Patient is a 73 y.o. female s/p Rt TKA on 10/22/19 with PMH significant for T12-L1 kyphoplasty, CML, hypothyroidism, and OA.    PT Comments    Progressing with mobility. Will plan to have a 2nd session to continue gait training and stair training prior to d/c home later today.    Follow Up Recommendations  Follow surgeon's recommendation for DC plan and follow-up therapies     Equipment Recommendations  None recommended by PT    Recommendations for Other Services       Precautions / Restrictions Precautions Precautions: Fall Restrictions Weight Bearing Restrictions: No Other Position/Activity Restrictions: WBAT    Mobility  Bed Mobility               General bed mobility comments: pt in recliner  Transfers Overall transfer level: Needs assistance Equipment used: Rolling walker (2 wheeled) Transfers: Sit to/from [facility] Sit to Stand: Min assist Stand pivot transfers: Min assist       General transfer comment: Assist to rise, stabilize, control descent. VCs safety, hand placement. Stand pivot, recliner to bsc, using RW.  Ambulation/Gait Ambulation/Gait assistance: Min assist Gait Distance (Feet): 100 Feet Assistive device: Rolling walker (2 wheeled) Gait Pattern/deviations: Step-to pattern;Step-through pattern;Decreased stride length     General Gait Details: Intermittent assist to steady mostly towards end of walk. Pt reported R knee felt a little unstable/wanted to buckle. Cues for safety, technqiue, sequence, proper use/distance from RW.   Stairs             Wheelchair Mobility    Modified Rankin (Stroke Patients Only)       Balance Overall balance assessment: Needs assistance         Standing balance support: Bilateral upper extremity supported Standing  balance-Leahy Scale: Poor                              Cognition Arousal/Alertness: Awake/alert Behavior During Therapy: WFL for tasks assessed/performed Overall Cognitive Status: Within Functional Limits for tasks assessed                                        Exercises Total Joint Exercises Ankle Circles/Pumps: AROM;Both;10 reps;Seated Quad Sets: AROM;Both;10 reps;Seated Heel Slides: AAROM;Right;10 reps;Supine Hip ABduction/ADduction: AAROM;Right;10 reps;Supine Straight Leg Raises: AAROM;Right;10 reps;Supine Goniometric ROM: ~10-50 degrees    General Comments        Pertinent Vitals/Pain Pain Assessment: 0-10 Pain Score: 5  Pain Location: R knee Pain Descriptors / Indicators: Aching;Sore Pain Intervention(s): Limited activity within patient's tolerance;Ice applied    Home Living                      Prior Function            PT Goals (current goals can now be found in the care plan section) Progress towards PT goals: Progressing toward goals    Frequency    7X/week      PT Plan Current plan remains appropriate    Co-evaluation              AM-PAC PT "6 Clicks" Mobility   Outcome Measure  Help needed turning from your back to your side while in a  flat bed without using bedrails?: A Little Help needed moving from lying on your back to sitting on the side of a flat bed without using bedrails?: A Little Help needed moving to and from a bed to a chair (including a wheelchair)?: A Little Help needed standing up from a chair using your arms (e.g., wheelchair or bedside chair)?: A Little Help needed to walk in hospital room?: A Little Help needed climbing 3-5 steps with a railing? : A Little 6 Click Score: 18    End of Session Equipment Utilized During Treatment: Gait belt Activity Tolerance: Patient tolerated treatment well Patient left: in chair;with call bell/phone within reach   PT Visit Diagnosis: Difficulty  in walking, not elsewhere classified (R26.2);Muscle weakness (generalized) (M62.81)     Time: [id number] PT Time Calculation (min) (ACUTE ONLY): 33 min  Charges:  $Gait Training: 8-22 mins $Therapeutic Exercise: 8-22 mins                       Weston Anna, PT Acute Rehabilitation Services Pager: [phone] Office: [phone]

## 2019-10-23 NOTE — Progress Notes (Signed)
Subjective: 1 Day Post-Op Procedure(s) (LRB): TOTAL KNEE ARTHROPLASTY (Right) Patient reports pain as mild.   Patient seen in rounds by Dr. Wynelle Link. Patient is well, and has had no acute complaints or problems other than discomfort in the right knee. No acute events overnight. Patient ambulated 80 feet with PT yesterday. Foley catheter removed, positive flatus. Denies CP, SHOB. We will continue therapy today.   Objective: Vital signs in last 24 hours: Temp:  [97.4 F (36.3 C)-98.7 F (37.1 C)] 97.5 F (36.4 C) (11/10 0606) Pulse Rate:  [57-88] 60 (11/10 0606) Resp:  [11-23] 16 (11/10 0606) BP: (118-166)/(57-81) 135/63 (11/10 0606) SpO2:  [94 %-100 %] 100 % (11/10 0606) Weight:  [101.4 kg] 101.4 kg (11/09 0828)  Intake/Output from previous day:  Intake/Output Summary (Last 24 hours) at 10/23/2019 0754 Last data filed at 10/23/2019 0643 Gross per 24 hour  Intake 3175.93 ml  Output 1303 ml  Net 1872.93 ml     Intake/Output this shift: No intake/output data recorded.  Labs: Recent Labs    10/23/19 0222  HGB 9.8*   Recent Labs    10/23/19 0222  WBC 15.4*  RBC 3.46*  HCT 33.0*  PLT 234   Recent Labs    10/23/19 0222  NA 139  K 5.3*  CL 107  CO2 26  BUN 20  CREATININE 0.87  GLUCOSE 132*  CALCIUM 8.5*   No results for input(s): LABPT, INR in the last 72 hours.  Exam: General - Patient is Alert and Oriented Extremity - Neurologically intact Sensation intact distally Intact pulses distally Dorsiflexion/Plantar flexion intact Dressing - dressing C/D/I Motor Function - intact, moving foot and toes well on exam.   Past Medical History:  Diagnosis Date  . Arthritis   . 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 Gastric ulcer 2011   EGD, 5/11  . Hypothyroidism    not on meds, followed by Dr. Elyse Hsu  . Leukocytosis 11/20/2015  . Lipoma    left upper arm  . Pericardial effusion    a. HCAP complicated by pericardial  effusion requiring pericardial window 10/2016 and large L pleural effusion requring VATS.  Marland Kitchen Pleural effusion    a. s/p VATS 2017.  Marland Kitchen Pneumonia 10/2016  . PONV (postoperative nausea and vomiting)   . Prolonged QT interval   . Urine incontinence   . UTI (lower urinary tract infection)    frequent    Assessment/Plan: 1 Day Post-Op Procedure(s) (LRB): TOTAL KNEE ARTHROPLASTY (Right) Principal Problem:   OA (osteoarthritis) of knee  Estimated body mass index is 36.07 kg/m as calculated from the following:   Height as of this encounter: 5\' 6"  (1.676 m).   Weight as of this encounter: 101.4 kg. Advance diet Up with therapy D/C IV fluids   Patient's anticipated LOS is less than 2 midnights, meeting these requirements: - Lives within 1 hour of care - Has a competent adult at home to recover with post-op recover - NO history of  - Chronic pain requiring opiods  - Diabetes  - Coronary Artery Disease  - Heart failure  - Heart attack  - Stroke  - DVT/VTE  - Cardiac arrhythmia  - Respiratory Failure/COPD  - Renal failure  - Anemia  - Advanced Liver disease  DVT Prophylaxis - Xarelto Weight bearing as tolerated. D/C O2 and pulse ox and try on room air. Hemovac pulled without difficulty, will begin therapy today.  Plan is to go Home after hospital stay. Plan for discharge  today following 1-2 sessions of therapy if she is meeting her goals and progressing with therapy. Scheduled for OPPT. Follow up in the office in 2 weeks.   Griffith Citron, PA-C Orthopedic Surgery [phone] 10/23/2019, 7:54 AM

## 2019-10-23 NOTE — Plan of Care (Signed)
  Problem: Education: Goal: Knowledge of the prescribed therapeutic regimen will improve Outcome: Progressing   Problem: Activity: Goal: Ability to avoid complications of mobility impairment will improve Outcome: Progressing Goal: Range of joint motion will improve Outcome: Progressing   Problem: Pain Management: Goal: Pain level will decrease with appropriate interventions Outcome: Progressing   

## 2019-10-26 ENCOUNTER — Ambulatory Visit: Payer: Medicare Other | Attending: Orthopedic Surgery | Admitting: Physical Therapy

## 2019-10-26 ENCOUNTER — Other Ambulatory Visit: Payer: Self-pay

## 2019-10-26 ENCOUNTER — Encounter: Payer: Self-pay | Admitting: Physical Therapy

## 2019-10-26 DIAGNOSIS — M25661 Stiffness of right knee, not elsewhere classified: Secondary | ICD-10-CM | POA: Insufficient documentation

## 2019-10-26 DIAGNOSIS — M25561 Pain in right knee: Secondary | ICD-10-CM | POA: Insufficient documentation

## 2019-10-26 DIAGNOSIS — R600 Localized edema: Secondary | ICD-10-CM | POA: Insufficient documentation

## 2019-10-26 DIAGNOSIS — M6281 Muscle weakness (generalized): Secondary | ICD-10-CM | POA: Insufficient documentation

## 2019-10-26 DIAGNOSIS — G8929 Other chronic pain: Secondary | ICD-10-CM

## 2019-10-26 NOTE — Therapy (Signed)
[facility] [place], [place], [address] Phone: [phone]   Fax:  [phone]  Physical Therapy Evaluation  Patient Details  Name: Andrea Martinez MRN: [id number] Date of Birth: [birth year] Referring Provider (PT): Gaynelle Arabian MD.   Encounter Date: 10/26/2019  PT End of Session - 10/26/19 1306    Visit Number  1    Number of Visits  16    Date for PT Re-Evaluation  01/24/20    Authorization Type  FOTO AT LEAST EVERY 5TH VISIT.  PROGRESS NOTE AT 10TH VISIT.  KX MODIFIER AFTER 15 VISITS.    PT Start Time  1035    PT Stop Time  1134    PT Time Calculation (min)  59 min    Activity Tolerance  Patient limited by pain    Behavior During Therapy  [facility] for tasks assessed/performed       Past Medical History:  Diagnosis Date  . Arthritis   . Chronic myelogenous leukemia (CML), BCR-ABL1-positive ([place]) 11/20/2015  . Gastric ulcer 2011   EGD, 5/11  . Hypothyroidism    not on meds, followed by Dr. Elyse Hsu  . Leukocytosis 11/20/2015  . Lipoma    left upper arm  . Pericardial effusion    a. HCAP complicated by pericardial effusion requiring pericardial window 10/2016 and large L pleural effusion requring VATS.  Marland Kitchen Pleural effusion    a. s/p VATS 2017.  Marland Kitchen Pneumonia 10/2016  . PONV (postoperative nausea and vomiting)   . Prolonged QT interval   . Urine incontinence   . UTI (lower urinary tract infection)    frequent    Past Surgical History:  Procedure Laterality Date  . BRAVO [place] STUDY  12/04/2012   Procedure: BRAVO [place];  Surgeon: [name], MD;  [facility];  Service: Endoscopy;  Laterality: N/A;  . CHOLECYSTECTOMY N/A 01/03/2014   Procedure: LAPAROSCOPIC CHOLECYSTECTOMY WITH INTRAOPERATIVE CHOLANGIOGRAM;  Surgeon: Joyice Faster. Cornett, MD;  Location: [place];  Service: General;  Laterality: N/A;  . COLONOSCOPY  01/2004   DR [facility], few small tics  . ESOPHAGOGASTRODUODENOSCOPY  02/2010    gastric ulcers  . ESOPHAGOGASTRODUODENOSCOPY  12/04/2012   [id number] gastritis (inflammation) was found in the gastric antrum; multiple biopsies The duodenal mucosa showed no abnormalities in the bulb and second portion of the duodenum  . KYPHOPLASTY N/A 02/14/2019   Procedure: KYPHOPLASTY T12 and L1;  Surgeon: Melina Schools, MD;  Location: [place];  Service: Orthopedics;  Laterality: N/A;  120 mins  . LIPOMA EXCISION  08/02/2011   left shoulder  . NOSE SURGERY    . PARTIAL HYSTERECTOMY     vaginal at age 36 years of age  . TOE DEBRIDEMENT Left 1962   lt great toe  . TOTAL KNEE ARTHROPLASTY Right 10/22/2019   Procedure: TOTAL KNEE ARTHROPLASTY;  Surgeon: Gaynelle Arabian, MD;  [facility];  Service: Orthopedics;  Laterality: Right;  68min  . VIDEO ASSISTED THORACOSCOPY (VATS)/EMPYEMA Left 10/25/2016   Procedure: VIDEO ASSISTED THORACOSCOPY (VATS), BRONCH,DRAINAGE OF PLEURAL EFFUSION,PERICARDIAL WINDOW WITH DRAINAGE OF PERICARDIAL FLUID, TEE;  Surgeon: [name], MD;  Location: [place];  Service: Thoracic;  Laterality: Left;  Marland Kitchen VIDEO BRONCHOSCOPY N/A 10/25/2016   Procedure: VIDEO BRONCHOSCOPY;  Surgeon: [name], MD;  Location: [place];  Service: Thoracic;  Laterality: N/A;    There were no vitals filed for this visit.   Subjective Assessment - 10/26/19 1317    Subjective  COVID-19 screen performed prior to patient entering clinic.  The patient presents to the clinic today s/p right total knee replacement performed on 10/22/19.  She came into the clinic today in a wheelchair and reporting a pain-level of 10/10 today.  Reportedly she walked in the hospital after surgery but has not at home.  She states this is due to her high pain-level and feeling wekaness due to Leukemia    Pertinent History  Leukemia, Kyphoplasty, OA.    How long can you stand comfortably?  Today, < two minutes.    How long can you walk comfortably?  See evaluation.    Patient Stated Goals  Get out  of pain and walk.    Currently in Pain?  Yes    Pain Score  10-Worst pain ever    Pain Location  Knee    Pain Orientation  Right    Pain Descriptors / Indicators  Aching;Throbbing;Discomfort    Pain Type  Surgical pain    Pain Onset  In the past 7 days    Pain Frequency  Constant    Aggravating Factors   Movement.    Pain Relieving Factors  "Nothing."         [facility] PT Assessment - 10/26/19 0001      Assessment   Medical Diagnosis  Right total knee replacement.    Referring Provider (PT)  Gaynelle Arabian MD.    Onset Date/Surgical Date  --   10/22/19 (surgery date).     Precautions   Precautions  Fall      Restrictions   Weight Bearing Restrictions  No      Balance Screen   Has the patient fallen in the past 6 months  No    Has the patient had a decrease in activity level because of a fear of falling?   Yes    Is the patient reluctant to leave their home because of a fear of falling?   No      Home Environment   Living Environment  Private residence      Prior Function   Level of Independence  Independent      Observation/Other Assessments   Observations  Post-surgical dressing on right knee.    Focus on Therapeutic Outcomes (FOTO)   96% limitation.      Observation/Other Assessments-Edema    Edema  --   Moderate right knee edema.     ROM / Strength   AROM / PROM / Strength  PROM;Strength      PROM   Overall PROM Comments  Seated pasive right knee extension and flexion to 60 degrees.      Strength   Overall Strength Comments  Normal right ankle range of motion and strength.  Right knee flexion and knee extension= 1+/5 limited by pain.      Palpation   Palpation comment  Patient c/o diffuse right knee pain.      Transfers   Comments  Sit to stand with moderate assist of two.      Ambulation/Gait   Gait Comments  Patient stood at walker and took one step with each foot.                Objective measurements completed on examination: See above  findings.      [facility] Adult PT Treatment/Exercise - 10/26/19 0001      Modalities   Modalities  Vasopneumatic      Vasopneumatic   Number Minutes Vasopneumatic   10  minutes    Vasopnuematic Location   --   Seated.  Rt knee resting on wedge and 2 pillows.   Vasopneumatic Pressure  Low               PT Short Term Goals - 10/26/19 1356      PT SHORT TERM GOAL #1   Title  Independent with an initial HEP.    Time  4    Period  Weeks    Status  New      PT SHORT TERM GOAL #2   Title  Full active right knee extension.    Time  4    Period  Weeks    Status  New      PT SHORT TERM GOAL #3   Title  Walk 200 feet in clinic with walker.    Time  4    Period  Weeks    Status  New        PT Long Term Goals - 10/26/19 1357      PT LONG TERM GOAL #1   Title  Right active knee flexion to 105-110 degrees.    Time  8    Period  Weeks    Status  New      PT LONG TERM GOAL #2   Title  Perform 4 stairs with one railing.    Time  8    Period  Weeks    Status  New      PT LONG TERM GOAL #3   Title  Right hip and knee strength to 4+/5 to increase stability for functional activities.    Time  8    Period  Weeks    Status  New             Plan - 10/26/19 1348    Clinical Impression Statement  The patient presents to OPPT s/p right total knee replacement performed on 10/22/19.  She came into the clinic today in a wheelchair reporting severe pain.  She demonstrated a FOTO limitation score of 96%.  She exhibited no active knee extension today.  Passive right knee extension is -20 degrees and flexion is 60 degrees.  She required moderate assist of two to stand at walker today and took one step with each foot today.  Spoke with family members today regarding taking pain medication as prescribed and to perform HEP and use walker at home with assistance as much as she can.    Personal Factors and Comorbidities  Comorbidity 1;Comorbidity 2    Comorbidities  Leukemia,  Kyphoplasty, OA.    Examination-Activity Limitations  Bathing;Bed Mobility;Locomotion Level;Other;Transfers;Toileting;Stand;Stairs    Examination-Participation Restrictions  Cleaning;Community Activity;Other    Stability/Clinical Decision Making  Stable/Uncomplicated    Clinical Decision Making  Low    Rehab Potential  Fair    PT Frequency  2x / week    PT Duration  8 weeks    PT Treatment/Interventions  ADLs/Self Care Home Management;Cryotherapy;Electrical Stimulation;Gait training;Stair training;Functional mobility training;Therapeutic activities;Therapeutic exercise;Neuromuscular re-education;Manual techniques;Patient/family education;Passive range of motion;Vasopneumatic Device    PT Next Visit Plan  Nustep, PROM, AAROM, vasopneumatic.    Consulted and Agree with Plan of Care  Patient       Patient will benefit from skilled therapeutic intervention in order to improve the following deficits and impairments:  Abnormal gait, Difficulty walking, Decreased activity tolerance, Decreased mobility, Decreased range of motion, Decreased strength, Increased edema, Pain  Visit Diagnosis: Chronic pain of right knee - Plan:  PT plan of care cert/re-cert  Stiffness of right knee, not elsewhere classified - Plan: PT plan of care cert/re-cert  Localized edema - Plan: PT plan of care cert/re-cert  Muscle weakness (generalized) - Plan: PT plan of care cert/re-cert     Problem List Patient Active Problem List   Diagnosis Date Noted  . Thoracic compression fracture ([place]) 02/14/2019  . Sweating abnormality 10/11/2017  . Iron deficiency anemia 02/16/2017  . Pleural effusion 10/23/2016  . Solitary pulmonary nodule 10/23/2016  . Anemia 11/21/2015  . Bladder prolapse, female, acquired 11/21/2015  . IBS (irritable bowel syndrome) 11/21/2015  . Neutrophilic leukocytosis 0000000  . OA (osteoarthritis) of knee 11/21/2015  . CML (chronic myeloid leukemia) ([place]) 11/20/2015  . Biliary dyskinesia  12/04/2013  . Dyspepsia 11/30/2012  . Rib pain 11/01/2011  . Colon cancer screening 09/21/2011  . Lipoma 07/27/2011  . Lipoma of arm 07/27/2011  . Mass of arm 07/08/2011  . PUD 05/20/2010  . ABDOMINAL PAIN RIGHT UPPER QUADRANT 02/12/2010  . Hypothyroidism 02/11/2010  . NAUSEA 02/11/2010  . UTI'S, HX OF 02/11/2010    Cassandr Cederberg, [place] MPT 10/26/2019, 2:01 PM  [facility] [address] [place], [place], [address] Phone: [phone]   Fax:  [phone]  Name: Andrea Martinez MRN: [id number] Date of Birth: 1946/10/23

## 2019-10-29 ENCOUNTER — Ambulatory Visit: Payer: Medicare Other | Admitting: Physical Therapy

## 2019-10-29 ENCOUNTER — Other Ambulatory Visit: Payer: Self-pay

## 2019-10-29 ENCOUNTER — Encounter: Payer: Self-pay | Admitting: Physical Therapy

## 2019-10-29 DIAGNOSIS — R600 Localized edema: Secondary | ICD-10-CM | POA: Diagnosis not present

## 2019-10-29 DIAGNOSIS — M25661 Stiffness of right knee, not elsewhere classified: Secondary | ICD-10-CM | POA: Diagnosis not present

## 2019-10-29 DIAGNOSIS — G8929 Other chronic pain: Secondary | ICD-10-CM

## 2019-10-29 DIAGNOSIS — M25561 Pain in right knee: Secondary | ICD-10-CM | POA: Diagnosis not present

## 2019-10-29 DIAGNOSIS — M6281 Muscle weakness (generalized): Secondary | ICD-10-CM

## 2019-10-29 NOTE — Therapy (Signed)
[facility] [place], [place], [address] Phone: [phone]   Fax:  [phone]  Physical Therapy Treatment  Patient Details  Name: Andrea Martinez MRN: [id number] Date of Birth: 02-May-1946 Referring Provider (PT): Gaynelle Arabian MD.   Encounter Date: 10/29/2019  PT End of Session - 10/29/19 1357    Visit Number  2    Number of Visits  16    Date for PT Re-Evaluation  01/24/20    Authorization Type  FOTO AT LEAST EVERY 5TH VISIT.  PROGRESS NOTE AT 10TH VISIT.  KX MODIFIER AFTER 15 VISITS.    PT Start Time  1350    PT Stop Time  1441    PT Time Calculation (min)  51 min    Equipment Utilized During Treatment  Other (comment)   WC   Activity Tolerance  Patient limited by pain    Behavior During Therapy  [facility] for tasks assessed/performed       Past Medical History:  Diagnosis Date  . Arthritis   . Chronic myelogenous leukemia (CML), BCR-ABL1-positive ([place]) 11/20/2015  . Gastric ulcer 2011   EGD, 5/11  . Hypothyroidism    not on meds, followed by Dr. Elyse Hsu  . Leukocytosis 11/20/2015  . Lipoma    left upper arm  . Pericardial effusion    a. HCAP complicated by pericardial effusion requiring pericardial window 10/2016 and large L pleural effusion requring VATS.  Marland Kitchen Pleural effusion    a. s/p VATS 2017.  Marland Kitchen Pneumonia 10/2016  . PONV (postoperative nausea and vomiting)   . Prolonged QT interval   . Urine incontinence   . UTI (lower urinary tract infection)    frequent    Past Surgical History:  Procedure Laterality Date  . BRAVO [place] STUDY  12/04/2012   Procedure: BRAVO [place];  Surgeon: [name], MD;  [facility];  Service: Endoscopy;  Laterality: N/A;  . CHOLECYSTECTOMY N/A 01/03/2014   Procedure: LAPAROSCOPIC CHOLECYSTECTOMY WITH INTRAOPERATIVE CHOLANGIOGRAM;  Surgeon: Joyice Faster. Cornett, MD;  Location: [place];  Service: General;  Laterality: N/A;  . COLONOSCOPY  01/2004   DR  [facility], few small tics  . ESOPHAGOGASTRODUODENOSCOPY  02/2010   gastric ulcers  . ESOPHAGOGASTRODUODENOSCOPY  12/04/2012   [id number] gastritis (inflammation) was found in the gastric antrum; multiple biopsies The duodenal mucosa showed no abnormalities in the bulb and second portion of the duodenum  . KYPHOPLASTY N/A 02/14/2019   Procedure: KYPHOPLASTY T12 and L1;  Surgeon: Melina Schools, MD;  Location: [place];  Service: Orthopedics;  Laterality: N/A;  120 mins  . LIPOMA EXCISION  08/02/2011   left shoulder  . NOSE SURGERY    . PARTIAL HYSTERECTOMY     vaginal at age 89 years of age  . TOE DEBRIDEMENT Left 1962   lt great toe  . TOTAL KNEE ARTHROPLASTY Right 10/22/2019   Procedure: TOTAL KNEE ARTHROPLASTY;  Surgeon: Gaynelle Arabian, MD;  [facility];  Service: Orthopedics;  Laterality: Right;  71min  . VIDEO ASSISTED THORACOSCOPY (VATS)/EMPYEMA Left 10/25/2016   Procedure: VIDEO ASSISTED THORACOSCOPY (VATS), BRONCH,DRAINAGE OF PLEURAL EFFUSION,PERICARDIAL WINDOW WITH DRAINAGE OF PERICARDIAL FLUID, TEE;  Surgeon: [name], MD;  Location: [place];  Service: Thoracic;  Laterality: Left;  Marland Kitchen VIDEO BRONCHOSCOPY N/A 10/25/2016   Procedure: VIDEO BRONCHOSCOPY;  Surgeon: [name], MD;  Location: [place];  Service: Thoracic;  Laterality: N/A;    There were no vitals filed for this visit.  Subjective Assessment - 10/29/19 1354    Subjective  COVID 19 screening performed on patient upon arrival. Reports doing a little better than last week. Reports tightness of the skin.    Patient is accompained by:  Family member   Husband, Daughter   Pertinent History  Leukemia, Kyphoplasty, OA.    How long can you stand comfortably?  Today, < two minutes.    How long can you walk comfortably?  See evaluation.    Patient Stated Goals  Get out of pain and walk.    Currently in Pain?  Yes    Pain Score  6     Pain Location  Knee    Pain Orientation  Right    Pain Descriptors / Indicators   Discomfort    Pain Type  Surgical pain    Pain Onset  In the past 7 days    Pain Frequency  Constant         [facility] PT Assessment - 10/29/19 0001      Assessment   Medical Diagnosis  Right total knee replacement.    Referring Provider (PT)  Gaynelle Arabian MD.    Onset Date/Surgical Date  10/22/19    Next MD Visit  11/06/2019      Precautions   Precautions  Fall      Restrictions   Weight Bearing Restrictions  No      ROM / Strength   AROM / PROM / Strength  AROM      AROM   Overall AROM   Deficits    Overall AROM Comments  in sitting    AROM Assessment Site  Knee    Right/Left Knee  Right    Right Knee Flexion  80                   [facility] Adult PT Treatment/Exercise - 10/29/19 0001      Exercises   Exercises  Knee/Hip      Knee/Hip Exercises: Aerobic   Nustep  L1, seat 13-10 x16 min for ROM    Stepper         Knee/Hip Exercises: Seated   Heel Slides  AROM;Right;2 sets;10 reps    Marching  AROM;Right;10 reps      Modalities   Modalities  Vasopneumatic   in sitting with 3 pillows under R foot     Vasopneumatic   Number Minutes Vasopneumatic   15 minutes    Vasopnuematic Location   Knee    Vasopneumatic Pressure  Low    Vasopneumatic Temperature   34      Manual Therapy   Manual Therapy  Passive ROM;Soft tissue mobilization    Soft tissue mobilization  STW to R distal ITB to reduce pain    Passive ROM  Gentle PROM of R knee into flexion               PT Short Term Goals - 10/29/19 1436      PT SHORT TERM GOAL #1   Title  Independent with an initial HEP.    Time  4    Period  Weeks    Status  On-going      PT SHORT TERM GOAL #2   Title  Full active right knee extension.    Time  4    Period  Weeks    Status  On-going      PT SHORT TERM GOAL #3   Title  Walk 200 feet in clinic  with walker.    Time  4    Period  Weeks    Status  On-going        PT Long Term Goals - 10/29/19 1436      PT LONG TERM GOAL #1   Title   Right active knee flexion to 105-110 degrees.    Time  8    Period  Weeks    Status  On-going      PT LONG TERM GOAL #2   Title  Perform 4 stairs with one railing.    Time  8    Period  Weeks    Status  On-going      PT LONG TERM GOAL #3   Title  Right hip and knee strength to 4+/5 to increase stability for functional activities.    Time  8    Period  Weeks    Status  On-going            Plan - 10/29/19 1438    Clinical Impression Statement  Patient presents in clinic in [facility] and continued limitations following R TKR. Patient unable to ambulate as she reports LLE buckling and inability to WB through RLE. Patient able to tolerate progression of seat while on nustep. Transitions are completed at slower pace using standing pivot transfers. Patient and family members that were present were educated to take pain medication one hour prior to PT treatment to improve effectiveness. AROM R knee flexion measured as 80 deg today in sitting. Patient reported more burning and stretching of RLE today. Frequent oscillations of RLE provided to promote relaxation. Patient only able to tolerate 10 minutes of vasopnuematic device with 3 pillow proped under R foot.    Personal Factors and Comorbidities  Comorbidity 1;Comorbidity 2    Comorbidities  Leukemia, Kyphoplasty, OA.    Examination-Activity Limitations  Bathing;Bed Mobility;Locomotion Level;Other;Transfers;Toileting;Stand;Stairs    Examination-Participation Restrictions  Cleaning;Community Activity;Other    Stability/Clinical Decision Making  Stable/Uncomplicated    Rehab Potential  Fair    PT Frequency  2x / week    PT Duration  8 weeks    PT Treatment/Interventions  ADLs/Self Care Home Management;Cryotherapy;Electrical Stimulation;Gait training;Stair training;Functional mobility training;Therapeutic activities;Therapeutic exercise;Neuromuscular re-education;Manual techniques;Patient/family education;Passive range of motion;Vasopneumatic Device     PT Next Visit Plan  Nustep, PROM, AAROM, vasopneumatic.    Consulted and Agree with Plan of Care  Patient;Family member/caregiver    Family Member Consulted  Husband, Daughter       Patient will benefit from skilled therapeutic intervention in order to improve the following deficits and impairments:  Abnormal gait, Difficulty walking, Decreased activity tolerance, Decreased mobility, Decreased range of motion, Decreased strength, Increased edema, Pain  Visit Diagnosis: Chronic pain of right knee  Stiffness of right knee, not elsewhere classified  Localized edema  Muscle weakness (generalized)     Problem List Patient Active Problem List   Diagnosis Date Noted  . Thoracic compression fracture ([place]) 02/14/2019  . Sweating abnormality 10/11/2017  . Iron deficiency anemia 02/16/2017  . Pleural effusion 10/23/2016  . Solitary pulmonary nodule 10/23/2016  . Anemia 11/21/2015  . Bladder prolapse, female, acquired 11/21/2015  . IBS (irritable bowel syndrome) 11/21/2015  . Neutrophilic leukocytosis 0000000  . OA (osteoarthritis) of knee 11/21/2015  . CML (chronic myeloid leukemia) ([place]) 11/20/2015  . Biliary dyskinesia 12/04/2013  . Dyspepsia 11/30/2012  . Rib pain 11/01/2011  . Colon cancer screening 09/21/2011  . Lipoma 07/27/2011  . Lipoma of arm 07/27/2011  . Mass of  arm 07/08/2011  . PUD 05/20/2010  . ABDOMINAL PAIN RIGHT UPPER QUADRANT 02/12/2010  . Hypothyroidism 02/11/2010  . NAUSEA 02/11/2010  . UTI'S, HX OF 02/11/2010    Standley Brooking, PTA 10/29/2019, 3:01 PM  [facility] [address] [place], [place], [address] Phone: [phone]   Fax:  [phone]  Name: KAMERYN HERTZLER MRN: [id number] Date of Birth: August 23, 1946

## 2019-10-31 ENCOUNTER — Ambulatory Visit (HOSPITAL_COMMUNITY)
Admission: RE | Admit: 2019-10-31 | Discharge: 2019-10-31 | Disposition: A | Discharge status: Home / Self Care | Payer: Medicare Other | Source: Ambulatory Visit | Attending: Cardiology | Admitting: Cardiology

## 2019-10-31 ENCOUNTER — Other Ambulatory Visit (HOSPITAL_COMMUNITY): Payer: Self-pay | Admitting: Orthopedic Surgery

## 2019-10-31 ENCOUNTER — Ambulatory Visit: Payer: Medicare Other | Admitting: Physical Therapy

## 2019-10-31 ENCOUNTER — Other Ambulatory Visit: Payer: Self-pay

## 2019-10-31 DIAGNOSIS — M79661 Pain in right lower leg: Secondary | ICD-10-CM

## 2019-10-31 DIAGNOSIS — M7989 Other specified soft tissue disorders: Secondary | ICD-10-CM | POA: Diagnosis not present

## 2019-10-31 DIAGNOSIS — Z96652 Presence of left artificial knee joint: Secondary | ICD-10-CM | POA: Insufficient documentation

## 2019-10-31 NOTE — Therapy (Signed)
[facility] [place], [place], [address] Phone: [phone]   Fax:  [phone]  Physical Therapy Treatment  Patient Details  Name: Andrea Martinez MRN: [id number] Date of Birth: Feb 25, 1946 Referring Provider (PT): Gaynelle Arabian MD.   Encounter Date: 10/31/2019    Past Medical History:  Diagnosis Date  . Arthritis   . Chronic myelogenous leukemia (CML), BCR-ABL1-positive ([place]) 11/20/2015  . Gastric ulcer 2011   EGD, 5/11  . Hypothyroidism    not on meds, followed by Dr. Elyse Hsu  . Leukocytosis 11/20/2015  . Lipoma    left upper arm  . Pericardial effusion    a. HCAP complicated by pericardial effusion requiring pericardial window 10/2016 and large L pleural effusion requring VATS.  Marland Kitchen Pleural effusion    a. s/p VATS 2017.  Marland Kitchen Pneumonia 10/2016  . PONV (postoperative nausea and vomiting)   . Prolonged QT interval   . Urine incontinence   . UTI (lower urinary tract infection)    frequent    Past Surgical History:  Procedure Laterality Date  . BRAVO [place] STUDY  12/04/2012   Procedure: BRAVO [place];  Surgeon: [name], MD;  [facility];  Service: Endoscopy;  Laterality: N/A;  . CHOLECYSTECTOMY N/A 01/03/2014   Procedure: LAPAROSCOPIC CHOLECYSTECTOMY WITH INTRAOPERATIVE CHOLANGIOGRAM;  Surgeon: Joyice Faster. Cornett, MD;  Location: [place];  Service: General;  Laterality: N/A;  . COLONOSCOPY  01/2004   DR [facility], few small tics  . ESOPHAGOGASTRODUODENOSCOPY  02/2010   gastric ulcers  . ESOPHAGOGASTRODUODENOSCOPY  12/04/2012   [id number] gastritis (inflammation) was found in the gastric antrum; multiple biopsies The duodenal mucosa showed no abnormalities in the bulb and second portion of the duodenum  . KYPHOPLASTY N/A 02/14/2019   Procedure: KYPHOPLASTY T12 and L1;  Surgeon: Melina Schools, MD;  Location: [place];  Service: Orthopedics;  Laterality: N/A;  120 mins  . LIPOMA EXCISION   08/02/2011   left shoulder  . NOSE SURGERY    . PARTIAL HYSTERECTOMY     vaginal at age 61 years of age  . TOE DEBRIDEMENT Left 1962   lt great toe  . TOTAL KNEE ARTHROPLASTY Right 10/22/2019   Procedure: TOTAL KNEE ARTHROPLASTY;  Surgeon: Gaynelle Arabian, MD;  [facility];  Service: Orthopedics;  Laterality: Right;  58min  . VIDEO ASSISTED THORACOSCOPY (VATS)/EMPYEMA Left 10/25/2016   Procedure: VIDEO ASSISTED THORACOSCOPY (VATS), BRONCH,DRAINAGE OF PLEURAL EFFUSION,PERICARDIAL WINDOW WITH DRAINAGE OF PERICARDIAL FLUID, TEE;  Surgeon: [name], MD;  Location: [place];  Service: Thoracic;  Laterality: Left;  Marland Kitchen VIDEO BRONCHOSCOPY N/A 10/25/2016   Procedure: VIDEO BRONCHOSCOPY;  Surgeon: [name], MD;  Location: [place];  Service: Thoracic;  Laterality: N/A;    There were no vitals filed for this visit.      Patient arrived in clinic with her husband. B TED hose donned to BLE but unable to be donned correctly secondary to increased swelling. RLE observed with increased swelling compared to LLE. Patient still unable to tolerate much weightbearing through RLE. Patient reported an area of R calf in which she felt a "knot" and reports of increased pain and tenderness. Patient's R mid calf in the size of a palm was tender with touch. Increased redness also present directly over painful area. R calf redness was not present during visit to PT office on 10/29/2019. Increased temperature also noted over the area as well. Mali Applegate, MPT measured R calf and compared to  L calf in which a one inch circumferential difference was reported. Patient and her husband educated regarding the findings. Patient's PCP and surgeon contacted in which the surgeon's office directed for an appointment with a PA at 1:15 pm today in their office to advise. Patient continues to also have blisters around her lips and in her mouth from medication. Upon standing, a smaller area of redness noted in L  calf.                        PT Short Term Goals - 10/29/19 1436      PT SHORT TERM GOAL #1   Title  Independent with an initial HEP.    Time  4    Period  Weeks    Status  On-going      PT SHORT TERM GOAL #2   Title  Full active right knee extension.    Time  4    Period  Weeks    Status  On-going      PT SHORT TERM GOAL #3   Title  Walk 200 feet in clinic with walker.    Time  4    Period  Weeks    Status  On-going        PT Long Term Goals - 10/29/19 1436      PT LONG TERM GOAL #1   Title  Right active knee flexion to 105-110 degrees.    Time  8    Period  Weeks    Status  On-going      PT LONG TERM GOAL #2   Title  Perform 4 stairs with one railing.    Time  8    Period  Weeks    Status  On-going      PT LONG TERM GOAL #3   Title  Right hip and knee strength to 4+/5 to increase stability for functional activities.    Time  8    Period  Weeks    Status  On-going              Patient will benefit from skilled therapeutic intervention in order to improve the following deficits and impairments:     Visit Diagnosis: Chronic pain of right knee  Stiffness of right knee, not elsewhere classified  Localized edema  Muscle weakness (generalized)     Problem List Patient Active Problem List   Diagnosis Date Noted  . Thoracic compression fracture ([place]) 02/14/2019  . Sweating abnormality 10/11/2017  . Iron deficiency anemia 02/16/2017  . Pleural effusion 10/23/2016  . Solitary pulmonary nodule 10/23/2016  . Anemia 11/21/2015  . Bladder prolapse, female, acquired 11/21/2015  . IBS (irritable bowel syndrome) 11/21/2015  . Neutrophilic leukocytosis 0000000  . OA (osteoarthritis) of knee 11/21/2015  . CML (chronic myeloid leukemia) ([place]) 11/20/2015  . Biliary dyskinesia 12/04/2013  . Dyspepsia 11/30/2012  . Rib pain 11/01/2011  . Colon cancer screening 09/21/2011  . Lipoma 07/27/2011  . Lipoma of arm 07/27/2011  . Mass  of arm 07/08/2011  . PUD 05/20/2010  . ABDOMINAL PAIN RIGHT UPPER QUADRANT 02/12/2010  . Hypothyroidism 02/11/2010  . NAUSEA 02/11/2010  . Emiliano Dyer OF 02/11/2010    Standley Brooking, PTA 10/31/19 11:30 AM   [facility] [address] [place], [place], [address] Phone: [phone]   Fax:  [phone]  Name: Andrea Martinez MRN: [id number] Date of Birth: 1946/04/21

## 2019-11-02 ENCOUNTER — Encounter: Payer: Self-pay | Admitting: Physical Therapy

## 2019-11-02 ENCOUNTER — Ambulatory Visit: Payer: Medicare Other | Admitting: Physical Therapy

## 2019-11-02 ENCOUNTER — Other Ambulatory Visit: Payer: Self-pay

## 2019-11-02 DIAGNOSIS — R600 Localized edema: Secondary | ICD-10-CM | POA: Diagnosis not present

## 2019-11-02 DIAGNOSIS — G8929 Other chronic pain: Secondary | ICD-10-CM | POA: Diagnosis not present

## 2019-11-02 DIAGNOSIS — M25661 Stiffness of right knee, not elsewhere classified: Secondary | ICD-10-CM

## 2019-11-02 DIAGNOSIS — M6281 Muscle weakness (generalized): Secondary | ICD-10-CM | POA: Diagnosis not present

## 2019-11-02 DIAGNOSIS — M25561 Pain in right knee: Secondary | ICD-10-CM | POA: Diagnosis not present

## 2019-11-02 NOTE — Therapy (Signed)
[facility] [place], [place], [address] Phone: [phone]   Fax:  [phone]  Physical Therapy Treatment  Patient Details  Name: Andrea Martinez MRN: [id number] Date of Birth: Mar 22, 1946 Referring Provider (PT): Gaynelle Arabian MD.   Encounter Date: 11/02/2019  PT End of Session - 11/02/19 1030    Visit Number  3    Number of Visits  16    Date for PT Re-Evaluation  01/24/20    Authorization Type  FOTO AT LEAST EVERY 5TH VISIT.  PROGRESS NOTE AT 10TH VISIT.  KX MODIFIER AFTER 15 VISITS.    PT Start Time  1034    PT Stop Time  1121    PT Time Calculation (min)  47 min    Equipment Utilized During Treatment  Other (comment);Gait belt   WC, FWW   Activity Tolerance  Patient limited by pain    Behavior During Therapy  WFL for tasks assessed/performed       Past Medical History:  Diagnosis Date  . Arthritis   . Chronic myelogenous leukemia (CML), BCR-ABL1-positive ([place]) 11/20/2015  . Gastric ulcer 2011   EGD, 5/11  . Hypothyroidism    not on meds, followed by Dr. Elyse Hsu  . Leukocytosis 11/20/2015  . Lipoma    left upper arm  . Pericardial effusion    a. HCAP complicated by pericardial effusion requiring pericardial window 10/2016 and large L pleural effusion requring VATS.  Marland Kitchen Pleural effusion    a. s/p VATS 2017.  Marland Kitchen Pneumonia 10/2016  . PONV (postoperative nausea and vomiting)   . Prolonged QT interval   . Urine incontinence   . UTI (lower urinary tract infection)    frequent    Past Surgical History:  Procedure Laterality Date  . BRAVO [place] STUDY  12/04/2012   Procedure: BRAVO [place];  Surgeon: [name], MD;  [facility];  Service: Endoscopy;  Laterality: N/A;  . CHOLECYSTECTOMY N/A 01/03/2014   Procedure: LAPAROSCOPIC CHOLECYSTECTOMY WITH INTRAOPERATIVE CHOLANGIOGRAM;  Surgeon: Joyice Faster. Cornett, MD;  Location: [place];  Service: General;  Laterality: N/A;  . COLONOSCOPY   01/2004   DR [facility], few small tics  . ESOPHAGOGASTRODUODENOSCOPY  02/2010   gastric ulcers  . ESOPHAGOGASTRODUODENOSCOPY  12/04/2012   [id number] gastritis (inflammation) was found in the gastric antrum; multiple biopsies The duodenal mucosa showed no abnormalities in the bulb and second portion of the duodenum  . KYPHOPLASTY N/A 02/14/2019   Procedure: KYPHOPLASTY T12 and L1;  Surgeon: Melina Schools, MD;  Location: [place];  Service: Orthopedics;  Laterality: N/A;  120 mins  . LIPOMA EXCISION  08/02/2011   left shoulder  . NOSE SURGERY    . PARTIAL HYSTERECTOMY     vaginal at age 80 years of age  . TOE DEBRIDEMENT Left 1962   lt great toe  . TOTAL KNEE ARTHROPLASTY Right 10/22/2019   Procedure: TOTAL KNEE ARTHROPLASTY;  Surgeon: Gaynelle Arabian, MD;  [facility];  Service: Orthopedics;  Laterality: Right;  29min  . VIDEO ASSISTED THORACOSCOPY (VATS)/EMPYEMA Left 10/25/2016   Procedure: VIDEO ASSISTED THORACOSCOPY (VATS), BRONCH,DRAINAGE OF PLEURAL EFFUSION,PERICARDIAL WINDOW WITH DRAINAGE OF PERICARDIAL FLUID, TEE;  Surgeon: [name], MD;  Location: [place];  Service: Thoracic;  Laterality: Left;  Marland Kitchen VIDEO BRONCHOSCOPY N/A 10/25/2016   Procedure: VIDEO BRONCHOSCOPY;  Surgeon: [name], MD;  Location: [place];  Service: Thoracic;  Laterality: N/A;    There were no vitals filed for  this visit.  Subjective Assessment - 11/02/19 1030    Subjective  COVID 19 screening performed on patient upon arrival. States that she does not have a DVT but states that pain is from excessive edema. Patient provided new mouth wash for blisters in her mouth.    Patient is accompained by:  Family member   Husband, [name]   Pertinent History  Leukemia, Kyphoplasty, OA.    How long can you stand comfortably?  Today, < two minutes.    How long can you walk comfortably?  See evaluation.    Patient Stated Goals  Get out of pain and walk.    Currently in Pain?  Yes    Pain Score  5     Pain  Location  Knee    Pain Orientation  Right    Pain Descriptors / Indicators  Discomfort    Pain Type  Surgical pain    Pain Onset  1 to 4 weeks ago    Pain Frequency  Constant         [facility] PT Assessment - 11/02/19 0001      Assessment   Medical Diagnosis  Right total knee replacement.    Referring Provider (PT)  Gaynelle Arabian MD.    Onset Date/Surgical Date  10/22/19    Next MD Visit  11/06/2019      Precautions   Precautions  Fall      Restrictions   Weight Bearing Restrictions  No                   [facility] Adult PT Treatment/Exercise - 11/02/19 0001      Ambulation/Gait   Ambulation/Gait  Yes    Ambulation/Gait Assistance  4: Min guard    Ambulation Distance (Feet)  15 Feet    Assistive device  Rolling walker    Gait Pattern  Step-to pattern;Decreased stance time - right;Decreased hip/knee flexion - right;Decreased dorsiflexion - right;Decreased weight shift to right;Decreased step length - left;Right foot flat;Right flexed knee in stance;Antalgic;Decreased trunk rotation;Trunk flexed;Narrow base of support    Ambulation Surface  Level;Indoor      Knee/Hip Exercises: Aerobic   Nustep  L3, seat 11-10 x17 min      Knee/Hip Exercises: Seated   Long Arc Quad  AROM;Right;20 reps    Heel Slides  AROM;Right;2 sets;10 reps    Marching  AROM;Right;20 reps      Modalities   Modalities  Vasopneumatic      Vasopneumatic   Number Minutes Vasopneumatic   10 minutes    Vasopnuematic Location   Knee    Vasopneumatic Pressure  Low    Vasopneumatic Temperature   34             PT Education - 11/02/19 1139    Education Details  HEP- heel slides, LAQ    Person(s) Educated  Patient    Methods  Explanation;Demonstration;Handout    Comprehension  Verbalized understanding       PT Short Term Goals - 10/29/19 1436      PT SHORT TERM GOAL #1   Title  Independent with an initial HEP.    Time  4    Period  Weeks    Status  On-going      PT SHORT TERM GOAL  #2   Title  Full active right knee extension.    Time  4    Period  Weeks    Status  On-going      PT  SHORT TERM GOAL #3   Title  Walk 200 feet in clinic with walker.    Time  4    Period  Weeks    Status  On-going        PT Long Term Goals - 10/29/19 1436      PT LONG TERM GOAL #1   Title  Right active knee flexion to 105-110 degrees.    Time  8    Period  Weeks    Status  On-going      PT LONG TERM GOAL #2   Title  Perform 4 stairs with one railing.    Time  8    Period  Weeks    Status  On-going      PT LONG TERM GOAL #3   Title  Right hip and knee strength to 4+/5 to increase stability for functional activities.    Time  8    Period  Weeks    Status  On-going            Plan - 11/02/19 1115    Clinical Impression Statement  Patient presented in clinic with reports of no DVT in RLE. Patient able to tolerate all therex fairly well although patient still limited by edema. Good R quad activation noted but weakness and extensor lag notable. Patient provided new HEP to improve ROM and quad strength. Patient heavily encouraged patient to complete HEP as directed and to ice throughout the day. Normal modalities response noted following removal of the modalities. Patient able to ambulate with FWW and gait belt today within the gym with VCs to improve heel/toe sequencing.    Personal Factors and Comorbidities  Comorbidity 1;Comorbidity 2    Comorbidities  Leukemia, Kyphoplasty, OA.    Examination-Activity Limitations  Bathing;Bed Mobility;Locomotion Level;Other;Transfers;Toileting;Stand;Stairs    Examination-Participation Restrictions  Cleaning;Community Activity;Other    Stability/Clinical Decision Making  Stable/Uncomplicated    Rehab Potential  Fair    PT Frequency  2x / week    PT Duration  8 weeks    PT Treatment/Interventions  ADLs/Self Care Home Management;Cryotherapy;Electrical Stimulation;Gait training;Stair training;Functional mobility training;Therapeutic  activities;Therapeutic exercise;Neuromuscular re-education;Manual techniques;Patient/family education;Passive range of motion;Vasopneumatic Device    PT Next Visit Plan  Nustep, PROM, AAROM, vasopneumatic.    Consulted and Agree with Plan of Care  Patient;Family member/caregiver    Family Member Consulted  Husband, Daughter       Patient will benefit from skilled therapeutic intervention in order to improve the following deficits and impairments:  Abnormal gait, Difficulty walking, Decreased activity tolerance, Decreased mobility, Decreased range of motion, Decreased strength, Increased edema, Pain  Visit Diagnosis: Chronic pain of right knee  Stiffness of right knee, not elsewhere classified  Localized edema  Muscle weakness (generalized)     Problem List Patient Active Problem List   Diagnosis Date Noted  . Thoracic compression fracture ([place]) 02/14/2019  . Sweating abnormality 10/11/2017  . Iron deficiency anemia 02/16/2017  . Pleural effusion 10/23/2016  . Solitary pulmonary nodule 10/23/2016  . Anemia 11/21/2015  . Bladder prolapse, female, acquired 11/21/2015  . IBS (irritable bowel syndrome) 11/21/2015  . Neutrophilic leukocytosis 0000000  . OA (osteoarthritis) of knee 11/21/2015  . CML (chronic myeloid leukemia) ([place]) 11/20/2015  . Biliary dyskinesia 12/04/2013  . Dyspepsia 11/30/2012  . Rib pain 11/01/2011  . Colon cancer screening 09/21/2011  . Lipoma 07/27/2011  . Lipoma of arm 07/27/2011  . Mass of arm 07/08/2011  . PUD 05/20/2010  . ABDOMINAL PAIN RIGHT UPPER QUADRANT  02/12/2010  . Hypothyroidism 02/11/2010  . NAUSEA 02/11/2010  . UTI'S, HX OF 02/11/2010    Standley Brooking, PTA 11/02/2019, 11:44 AM  [facility] [address] [place], [place], [address] Phone: [phone]   Fax:  [phone]  Name: Andrea Martinez MRN: [id number] Date of Birth: July 14, 1946

## 2019-11-05 ENCOUNTER — Ambulatory Visit: Payer: Medicare Other | Admitting: Physical Therapy

## 2019-11-05 ENCOUNTER — Encounter: Payer: Self-pay | Admitting: Physical Therapy

## 2019-11-05 ENCOUNTER — Other Ambulatory Visit: Payer: Self-pay

## 2019-11-05 DIAGNOSIS — R600 Localized edema: Secondary | ICD-10-CM | POA: Diagnosis not present

## 2019-11-05 DIAGNOSIS — G8929 Other chronic pain: Secondary | ICD-10-CM | POA: Diagnosis not present

## 2019-11-05 DIAGNOSIS — M25661 Stiffness of right knee, not elsewhere classified: Secondary | ICD-10-CM | POA: Diagnosis not present

## 2019-11-05 DIAGNOSIS — M6281 Muscle weakness (generalized): Secondary | ICD-10-CM | POA: Diagnosis not present

## 2019-11-05 DIAGNOSIS — M25561 Pain in right knee: Secondary | ICD-10-CM | POA: Diagnosis not present

## 2019-11-05 NOTE — Therapy (Signed)
[facility] [place], [place], [address] Phone: [phone]   Fax:  [phone]  Physical Therapy Treatment  Patient Details  Name: Andrea Martinez MRN: [id number] Date of Birth: 01-24-1946 Referring Provider (PT): Gaynelle Arabian MD.   Encounter Date: 11/05/2019  PT End of Session - 11/05/19 1510    Visit Number  4    Number of Visits  16    Date for PT Re-Evaluation  01/24/20    Authorization Type  FOTO AT LEAST EVERY 5TH VISIT.  PROGRESS NOTE AT 10TH VISIT.  KX MODIFIER AFTER 15 VISITS.    PT Start Time  1033    PT Stop Time  1124    PT Time Calculation (min)  51 min    Equipment Utilized During Treatment  Other (comment);Gait belt    Activity Tolerance  Patient limited by pain    Behavior During Therapy  WFL for tasks assessed/performed       Past Medical History:  Diagnosis Date  . Arthritis   . Chronic myelogenous leukemia (CML), BCR-ABL1-positive ([place]) 11/20/2015  . Gastric ulcer 2011   EGD, 5/11  . Hypothyroidism    not on meds, followed by Dr. Elyse Hsu  . Leukocytosis 11/20/2015  . Lipoma    left upper arm  . Pericardial effusion    a. HCAP complicated by pericardial effusion requiring pericardial window 10/2016 and large L pleural effusion requring VATS.  Marland Kitchen Pleural effusion    a. s/p VATS 2017.  Marland Kitchen Pneumonia 10/2016  . PONV (postoperative nausea and vomiting)   . Prolonged QT interval   . Urine incontinence   . UTI (lower urinary tract infection)    frequent    Past Surgical History:  Procedure Laterality Date  . BRAVO [place] STUDY  12/04/2012   Procedure: BRAVO [place];  Surgeon: [name], MD;  [facility];  Service: Endoscopy;  Laterality: N/A;  . CHOLECYSTECTOMY N/A 01/03/2014   Procedure: LAPAROSCOPIC CHOLECYSTECTOMY WITH INTRAOPERATIVE CHOLANGIOGRAM;  Surgeon: Joyice Faster. Cornett, MD;  Location: [place];  Service: General;  Laterality: N/A;  . COLONOSCOPY  01/2004   DR [facility], few small tics  . ESOPHAGOGASTRODUODENOSCOPY  02/2010   gastric ulcers  . ESOPHAGOGASTRODUODENOSCOPY  12/04/2012   [id number] gastritis (inflammation) was found in the gastric antrum; multiple biopsies The duodenal mucosa showed no abnormalities in the bulb and second portion of the duodenum  . KYPHOPLASTY N/A 02/14/2019   Procedure: KYPHOPLASTY T12 and L1;  Surgeon: Melina Schools, MD;  Location: [place];  Service: Orthopedics;  Laterality: N/A;  120 mins  . LIPOMA EXCISION  08/02/2011   left shoulder  . NOSE SURGERY    . PARTIAL HYSTERECTOMY     vaginal at age 9 years of age  . TOE DEBRIDEMENT Left 1962   lt great toe  . TOTAL KNEE ARTHROPLASTY Right 10/22/2019   Procedure: TOTAL KNEE ARTHROPLASTY;  Surgeon: Gaynelle Arabian, MD;  [facility];  Service: Orthopedics;  Laterality: Right;  67min  . VIDEO ASSISTED THORACOSCOPY (VATS)/EMPYEMA Left 10/25/2016   Procedure: VIDEO ASSISTED THORACOSCOPY (VATS), BRONCH,DRAINAGE OF PLEURAL EFFUSION,PERICARDIAL WINDOW WITH DRAINAGE OF PERICARDIAL FLUID, TEE;  Surgeon: [name], MD;  Location: [place];  Service: Thoracic;  Laterality: Left;  Marland Kitchen VIDEO BRONCHOSCOPY N/A 10/25/2016   Procedure: VIDEO BRONCHOSCOPY;  Surgeon: [name], MD;  Location: [place];  Service: Thoracic;  Laterality: N/A;    There were no vitals filed for this visit.  [place] Adult PT Treatment/Exercise - 11/05/19 0001      Exercises   Exercises  Knee/Hip      Knee/Hip Exercises: Aerobic   Nustep  Level 2 monitored over 20 minutes moving patient's seat forward as tolerated to increase knee flexion.      Knee/Hip Exercises: Seated   Other Seated Knee/Hip Exercises  Seated right knee active-assistive extension x 3 minutes.      Vasopneumatic   Number Minutes Vasopneumatic   20 minutes    Vasopnuematic Location   --   Right knee.   Vasopneumatic Pressure  Low               PT Short Term Goals -  10/29/19 1436      PT SHORT TERM GOAL #1   Title  Independent with an initial HEP.    Time  4    Period  Weeks    Status  On-going      PT SHORT TERM GOAL #2   Title  Full active right knee extension.    Time  4    Period  Weeks    Status  On-going      PT SHORT TERM GOAL #3   Title  Walk 200 feet in clinic with walker.    Time  4    Period  Weeks    Status  On-going        PT Long Term Goals - 10/29/19 1436      PT LONG TERM GOAL #1   Title  Right active knee flexion to 105-110 degrees.    Time  8    Period  Weeks    Status  On-going      PT LONG TERM GOAL #2   Title  Perform 4 stairs with one railing.    Time  8    Period  Weeks    Status  On-going      PT LONG TERM GOAL #3   Title  Right hip and knee strength to 4+/5 to increase stability for functional activities.    Time  8    Period  Weeks    Status  On-going            Plan - 11/05/19 1506    Clinical Impression Statement  Patient doing much better.  At eval she only had 20 degrees of available right knee ROM.  Today she demonstrated 85 degrees of passive flexion.    Personal Factors and Comorbidities  Comorbidity 1;Comorbidity 2    Comorbidities  Leukemia, Kyphoplasty, OA.    Examination-Activity Limitations  Bathing;Bed Mobility;Locomotion Level;Other;Transfers;Toileting;Stand;Stairs    Examination-Participation Restrictions  Cleaning;Community Activity;Other    Rehab Potential  Fair    PT Frequency  2x / week    PT Duration  8 weeks    PT Treatment/Interventions  ADLs/Self Care Home Management;Cryotherapy;Electrical Stimulation;Gait training;Stair training;Functional mobility training;Therapeutic activities;Therapeutic exercise;Neuromuscular re-education;Manual techniques;Patient/family education;Passive range of motion;Vasopneumatic Device    PT Next Visit Plan  Nustep, PROM, AAROM, vasopneumatic.    Consulted and Agree with Plan of Care  Patient;Family member/caregiver       Patient will  benefit from skilled therapeutic intervention in order to improve the following deficits and impairments:  Abnormal gait, Difficulty walking, Decreased activity tolerance, Decreased mobility, Decreased range of motion, Decreased strength, Increased edema, Pain  Visit Diagnosis: Chronic pain of right knee  Stiffness of right knee, not elsewhere classified  Localized edema  Muscle weakness (generalized)     Problem  List Patient Active Problem List   Diagnosis Date Noted  . Thoracic compression fracture ([place]) 02/14/2019  . Sweating abnormality 10/11/2017  . Iron deficiency anemia 02/16/2017  . Pleural effusion 10/23/2016  . Solitary pulmonary nodule 10/23/2016  . Anemia 11/21/2015  . Bladder prolapse, female, acquired 11/21/2015  . IBS (irritable bowel syndrome) 11/21/2015  . Neutrophilic leukocytosis 0000000  . OA (osteoarthritis) of knee 11/21/2015  . CML (chronic myeloid leukemia) ([place]) 11/20/2015  . Biliary dyskinesia 12/04/2013  . Dyspepsia 11/30/2012  . Rib pain 11/01/2011  . Colon cancer screening 09/21/2011  . Lipoma 07/27/2011  . Lipoma of arm 07/27/2011  . Mass of arm 07/08/2011  . PUD 05/20/2010  . ABDOMINAL PAIN RIGHT UPPER QUADRANT 02/12/2010  . Hypothyroidism 02/11/2010  . NAUSEA 02/11/2010  . UTI'S, HX OF 02/11/2010    Nafisah Runions, [place] MPT 11/05/2019, 3:11 PM  [facility] [place], [place], [address] Phone: [phone]   Fax:  [phone]  Name: Andrea Martinez MRN: [id number] Date of Birth: 05-Feb-1946

## 2019-11-07 ENCOUNTER — Ambulatory Visit: Payer: Medicare Other | Admitting: Physical Therapy

## 2019-11-07 ENCOUNTER — Encounter: Payer: Self-pay | Admitting: Physical Therapy

## 2019-11-07 ENCOUNTER — Other Ambulatory Visit: Payer: Self-pay

## 2019-11-07 DIAGNOSIS — R600 Localized edema: Secondary | ICD-10-CM | POA: Diagnosis not present

## 2019-11-07 DIAGNOSIS — M25561 Pain in right knee: Secondary | ICD-10-CM

## 2019-11-07 DIAGNOSIS — M6281 Muscle weakness (generalized): Secondary | ICD-10-CM

## 2019-11-07 DIAGNOSIS — M25661 Stiffness of right knee, not elsewhere classified: Secondary | ICD-10-CM | POA: Diagnosis not present

## 2019-11-07 DIAGNOSIS — G8929 Other chronic pain: Secondary | ICD-10-CM | POA: Diagnosis not present

## 2019-11-07 NOTE — Therapy (Signed)
[facility] [place], [place], [address] Phone: [phone]   Fax:  [phone]  Physical Therapy Treatment  Patient Details  Name: Andrea Martinez MRN: [id number] Date of Birth: 05-17-1946 Referring Provider (PT): Gaynelle Arabian MD.   Encounter Date: 11/07/2019  PT End of Session - 11/07/19 1056    Visit Number  5    Number of Visits  16    Date for PT Re-Evaluation  01/24/20    Authorization Type  FOTO AT LEAST EVERY 5TH VISIT.  PROGRESS NOTE AT 10TH VISIT.  KX MODIFIER AFTER 15 VISITS.    PT Start Time  1033    PT Stop Time  1128    PT Time Calculation (min)  55 min    Equipment Utilized During Treatment  Other (comment);Gait belt   FWW   Activity Tolerance  Patient tolerated treatment well    Behavior During Therapy  Anxious       Past Medical History:  Diagnosis Date  . Arthritis   . Chronic myelogenous leukemia (CML), BCR-ABL1-positive ([place]) 11/20/2015  . Gastric ulcer 2011   EGD, 5/11  . Hypothyroidism    not on meds, followed by Dr. Elyse Hsu  . Leukocytosis 11/20/2015  . Lipoma    left upper arm  . Pericardial effusion    a. HCAP complicated by pericardial effusion requiring pericardial window 10/2016 and large L pleural effusion requring VATS.  Marland Kitchen Pleural effusion    a. s/p VATS 2017.  Marland Kitchen Pneumonia 10/2016  . PONV (postoperative nausea and vomiting)   . Prolonged QT interval   . Urine incontinence   . UTI (lower urinary tract infection)    frequent    Past Surgical History:  Procedure Laterality Date  . BRAVO [place] STUDY  12/04/2012   Procedure: BRAVO [place];  Surgeon: [name], MD;  [facility];  Service: Endoscopy;  Laterality: N/A;  . CHOLECYSTECTOMY N/A 01/03/2014   Procedure: LAPAROSCOPIC CHOLECYSTECTOMY WITH INTRAOPERATIVE CHOLANGIOGRAM;  Surgeon: Joyice Faster. Cornett, MD;  Location: [place];  Service: General;  Laterality: N/A;  . COLONOSCOPY  01/2004   DR  [facility], few small tics  . ESOPHAGOGASTRODUODENOSCOPY  02/2010   gastric ulcers  . ESOPHAGOGASTRODUODENOSCOPY  12/04/2012   [id number] gastritis (inflammation) was found in the gastric antrum; multiple biopsies The duodenal mucosa showed no abnormalities in the bulb and second portion of the duodenum  . KYPHOPLASTY N/A 02/14/2019   Procedure: KYPHOPLASTY T12 and L1;  Surgeon: Melina Schools, MD;  Location: [place];  Service: Orthopedics;  Laterality: N/A;  120 mins  . LIPOMA EXCISION  08/02/2011   left shoulder  . NOSE SURGERY    . PARTIAL HYSTERECTOMY     vaginal at age 67 years of age  . TOE DEBRIDEMENT Left 1962   lt great toe  . TOTAL KNEE ARTHROPLASTY Right 10/22/2019   Procedure: TOTAL KNEE ARTHROPLASTY;  Surgeon: Gaynelle Arabian, MD;  [facility];  Service: Orthopedics;  Laterality: Right;  69min  . VIDEO ASSISTED THORACOSCOPY (VATS)/EMPYEMA Left 10/25/2016   Procedure: VIDEO ASSISTED THORACOSCOPY (VATS), BRONCH,DRAINAGE OF PLEURAL EFFUSION,PERICARDIAL WINDOW WITH DRAINAGE OF PERICARDIAL FLUID, TEE;  Surgeon: [name], MD;  Location: [place];  Service: Thoracic;  Laterality: Left;  Marland Kitchen VIDEO BRONCHOSCOPY N/A 10/25/2016   Procedure: VIDEO BRONCHOSCOPY;  Surgeon: [name], MD;  Location: [place];  Service: Thoracic;  Laterality: N/A;    There were no vitals filed for this visit.  Subjective  Assessment - 11/07/19 1031    Subjective  COVID 19 screening performed on patient upon arrival. Patient ambulated into clinic with FWW.    Pertinent History  Leukemia, Kyphoplasty, OA.    How long can you stand comfortably?  Today, < two minutes.    How long can you walk comfortably?  See evaluation.    Patient Stated Goals  Get out of pain and walk.    Currently in Pain?  Yes    Pain Score  6     Pain Location  Knee    Pain Orientation  Right    Pain Descriptors / Indicators  Sore    Pain Type  Surgical pain    Pain Onset  1 to 4 weeks ago    Pain Frequency  Constant          [facility] PT Assessment - 11/07/19 0001      Assessment   Medical Diagnosis  Right total knee replacement.    Referring Provider (PT)  Gaynelle Arabian MD.    Onset Date/Surgical Date  10/22/19    Next MD Visit  11/27/2019      Precautions   Precautions  Fall      Restrictions   Weight Bearing Restrictions  No                   [facility] Adult PT Treatment/Exercise - 11/07/19 0001      Knee/Hip Exercises: Aerobic   Nustep  L4, seat 10-9 x17 min      Knee/Hip Exercises: Seated   Long Arc Quad  AROM;Right;20 reps    Heel Slides  AROM;Right;2 sets;10 reps    [employer]  x20 reps 5 sec    Clamshell with TheraBand  Yellow   x20 reps   Marching  AROM;Right;15 reps      Modalities   Modalities  Vasopneumatic      Vasopneumatic   Number Minutes Vasopneumatic   15 minutes    Vasopnuematic Location   Knee    Vasopneumatic Pressure  Low    Vasopneumatic Temperature   34             PT Education - 11/07/19 1121    Education Details  Heel slides, LAQ HEP reprinted per patient request    Person(s) Educated  Patient    Methods  Explanation;Handout    Comprehension  Verbalized understanding       PT Short Term Goals - 10/29/19 1436      PT SHORT TERM GOAL #1   Title  Independent with an initial HEP.    Time  4    Period  Weeks    Status  On-going      PT SHORT TERM GOAL #2   Title  Full active right knee extension.    Time  4    Period  Weeks    Status  On-going      PT SHORT TERM GOAL #3   Title  Walk 200 feet in clinic with walker.    Time  4    Period  Weeks    Status  On-going        PT Long Term Goals - 10/29/19 1436      PT LONG TERM GOAL #1   Title  Right active knee flexion to 105-110 degrees.    Time  8    Period  Weeks    Status  On-going      PT LONG TERM GOAL #2  Title  Perform 4 stairs with one railing.    Time  8    Period  Weeks    Status  On-going      PT LONG TERM GOAL #3   Title  Right hip and knee strength to  4+/5 to increase stability for functional activities.    Time  8    Period  Weeks    Status  On-going            Plan - 11/07/19 1124    Clinical Impression Statement  Patient presented in clinic and able to ambulate safely with FWW. Limited R hip/knee flexion and less R ankle DF noted during ambulation. Patient able to tolerate seated therex fairly well although reporting tightness of the skin and stretch from the edema during knee flexion. Weakness of the R quad noted as well. HEP reprinted for patient per her request and VCs provided regarding parameters and technique. Continued edema into R calf and ankle noted. Patient educated importance of completing HEP and icing/elevating RLE to control edema.    Personal Factors and Comorbidities  Comorbidity 1;Comorbidity 2    Comorbidities  Leukemia, Kyphoplasty, OA.    Examination-Activity Limitations  Bathing;Bed Mobility;Locomotion Level;Other;Transfers;Toileting;Stand;Stairs    Examination-Participation Restrictions  Cleaning;Community Activity;Other    Stability/Clinical Decision Making  Stable/Uncomplicated    Rehab Potential  Fair    PT Frequency  2x / week    PT Duration  8 weeks    PT Treatment/Interventions  ADLs/Self Care Home Management;Cryotherapy;Electrical Stimulation;Gait training;Stair training;Functional mobility training;Therapeutic activities;Therapeutic exercise;Neuromuscular re-education;Manual techniques;Patient/family education;Passive range of motion;Vasopneumatic Device    PT Next Visit Plan  Nustep, PROM, AAROM, vasopneumatic.    Consulted and Agree with Plan of Care  Patient       Patient will benefit from skilled therapeutic intervention in order to improve the following deficits and impairments:  Abnormal gait, Difficulty walking, Decreased activity tolerance, Decreased mobility, Decreased range of motion, Decreased strength, Increased edema, Pain  Visit Diagnosis: Chronic pain of right knee  Stiffness of  right knee, not elsewhere classified  Localized edema  Muscle weakness (generalized)     Problem List Patient Active Problem List   Diagnosis Date Noted  . Thoracic compression fracture ([place]) 02/14/2019  . Sweating abnormality 10/11/2017  . Iron deficiency anemia 02/16/2017  . Pleural effusion 10/23/2016  . Solitary pulmonary nodule 10/23/2016  . Anemia 11/21/2015  . Bladder prolapse, female, acquired 11/21/2015  . IBS (irritable bowel syndrome) 11/21/2015  . Neutrophilic leukocytosis 0000000  . OA (osteoarthritis) of knee 11/21/2015  . CML (chronic myeloid leukemia) ([place]) 11/20/2015  . Biliary dyskinesia 12/04/2013  . Dyspepsia 11/30/2012  . Rib pain 11/01/2011  . Colon cancer screening 09/21/2011  . Lipoma 07/27/2011  . Lipoma of arm 07/27/2011  . Mass of arm 07/08/2011  . PUD 05/20/2010  . ABDOMINAL PAIN RIGHT UPPER QUADRANT 02/12/2010  . Hypothyroidism 02/11/2010  . NAUSEA 02/11/2010  . UTI'S, HX OF 02/11/2010    Standley Brooking, PTA 11/07/2019, 11:53 AM  [facility] [address] [place], [place], [address] Phone: [phone]   Fax:  [phone]  Name: Andrea Martinez MRN: [id number] Date of Birth: 1946/05/29

## 2019-11-12 ENCOUNTER — Ambulatory Visit: Payer: Medicare Other | Admitting: Physical Therapy

## 2019-11-12 ENCOUNTER — Other Ambulatory Visit: Payer: Self-pay

## 2019-11-12 ENCOUNTER — Encounter: Payer: Self-pay | Admitting: Physical Therapy

## 2019-11-12 DIAGNOSIS — G8929 Other chronic pain: Secondary | ICD-10-CM

## 2019-11-12 DIAGNOSIS — M25561 Pain in right knee: Secondary | ICD-10-CM

## 2019-11-12 DIAGNOSIS — R600 Localized edema: Secondary | ICD-10-CM | POA: Diagnosis not present

## 2019-11-12 DIAGNOSIS — M25661 Stiffness of right knee, not elsewhere classified: Secondary | ICD-10-CM

## 2019-11-12 DIAGNOSIS — M6281 Muscle weakness (generalized): Secondary | ICD-10-CM | POA: Diagnosis not present

## 2019-11-12 NOTE — Therapy (Signed)
[facility] [place], [place], [address] Phone: [phone]   Fax:  [phone]  Physical Therapy Treatment  Patient Details  Name: Andrea Martinez MRN: [id number] Date of Birth: 12-Dec-1946 Referring Provider (PT): Gaynelle Arabian MD.   Encounter Date: 11/12/2019  PT End of Session - 11/12/19 1001    Visit Number  6    Number of Visits  16    Date for PT Re-Evaluation  01/24/20    Authorization Type  FOTO AT LEAST EVERY 5TH VISIT.  PROGRESS NOTE AT 10TH VISIT.  KX MODIFIER AFTER 15 VISITS.    PT Start Time  [phone]    PT Stop Time  1038    PT Time Calculation (min)  52 min    Equipment Utilized During Treatment  Other (comment)   Rolling walker   Activity Tolerance  Patient tolerated treatment well    Behavior During Therapy  WFL for tasks assessed/performed       Past Medical History:  Diagnosis Date  . Arthritis   . Chronic myelogenous leukemia (CML), BCR-ABL1-positive ([place]) 11/20/2015  . Gastric ulcer 2011   EGD, 5/11  . Hypothyroidism    not on meds, followed by Dr. Elyse Hsu  . Leukocytosis 11/20/2015  . Lipoma    left upper arm  . Pericardial effusion    a. HCAP complicated by pericardial effusion requiring pericardial window 10/2016 and large L pleural effusion requring VATS.  Marland Kitchen Pleural effusion    a. s/p VATS 2017.  Marland Kitchen Pneumonia 10/2016  . PONV (postoperative nausea and vomiting)   . Prolonged QT interval   . Urine incontinence   . UTI (lower urinary tract infection)    frequent    Past Surgical History:  Procedure Laterality Date  . BRAVO [place] STUDY  12/04/2012   Procedure: BRAVO [place];  Surgeon: [name], MD;  [facility];  Service: Endoscopy;  Laterality: N/A;  . CHOLECYSTECTOMY N/A 01/03/2014   Procedure: LAPAROSCOPIC CHOLECYSTECTOMY WITH INTRAOPERATIVE CHOLANGIOGRAM;  Surgeon: Joyice Faster. Cornett, MD;  Location: [place];  Service: General;  Laterality: N/A;  .  COLONOSCOPY  01/2004   DR [facility], few small tics  . ESOPHAGOGASTRODUODENOSCOPY  02/2010   gastric ulcers  . ESOPHAGOGASTRODUODENOSCOPY  12/04/2012   [id number] gastritis (inflammation) was found in the gastric antrum; multiple biopsies The duodenal mucosa showed no abnormalities in the bulb and second portion of the duodenum  . KYPHOPLASTY N/A 02/14/2019   Procedure: KYPHOPLASTY T12 and L1;  Surgeon: Melina Schools, MD;  Location: [place];  Service: Orthopedics;  Laterality: N/A;  120 mins  . LIPOMA EXCISION  08/02/2011   left shoulder  . NOSE SURGERY    . PARTIAL HYSTERECTOMY     vaginal at age 26 years of age  . TOE DEBRIDEMENT Left 1962   lt great toe  . TOTAL KNEE ARTHROPLASTY Right 10/22/2019   Procedure: TOTAL KNEE ARTHROPLASTY;  Surgeon: Gaynelle Arabian, MD;  [facility];  Service: Orthopedics;  Laterality: Right;  65min  . VIDEO ASSISTED THORACOSCOPY (VATS)/EMPYEMA Left 10/25/2016   Procedure: VIDEO ASSISTED THORACOSCOPY (VATS), BRONCH,DRAINAGE OF PLEURAL EFFUSION,PERICARDIAL WINDOW WITH DRAINAGE OF PERICARDIAL FLUID, TEE;  Surgeon: [name], MD;  Location: [place];  Service: Thoracic;  Laterality: Left;  Marland Kitchen VIDEO BRONCHOSCOPY N/A 10/25/2016   Procedure: VIDEO BRONCHOSCOPY;  Surgeon: [name], MD;  Location: [place];  Service: Thoracic;  Laterality: N/A;    There were no vitals filed for this  visit.  Subjective Assessment - 11/12/19 1000    Subjective  COVID 19 screening performed on patient upon arrival. Patient arrives with stiffness and soreness in the right knee.    Pertinent History  Leukemia, Kyphoplasty, OA.    How long can you stand comfortably?  Today, < two minutes.    How long can you walk comfortably?  See evaluation.    Patient Stated Goals  Get out of pain and walk.    Currently in Pain?  Yes    Pain Score  7     Pain Location  Knee    Pain Orientation  Right    Pain Descriptors / Indicators  Sore    Pain Type  Surgical pain    Pain Onset  1  to 4 weeks ago    Pain Frequency  Constant         [facility] PT Assessment - 11/12/19 0001      Assessment   Medical Diagnosis  Right total knee replacement.    Referring Provider (PT)  Gaynelle Arabian MD.    Onset Date/Surgical Date  10/22/19    Next MD Visit  11/27/2019      Precautions   Precautions  Fall      Restrictions   Weight Bearing Restrictions  No                   [facility] Adult PT Treatment/Exercise - 11/12/19 0001      Exercises   Exercises  Knee/Hip      Knee/Hip Exercises: Stretches   Passive Hamstring Stretch  Right;5 reps;20 seconds      Knee/Hip Exercises: Aerobic   Nustep  L4, seat 9-8 x10 min      Knee/Hip Exercises: Seated   Long Arc Quad  AROM;Right;Other (comment)   x3 minutes   Heel Slides  AROM;Right;Other (comment)   x3 minutes   Ball Squeeze  x20 reps 5 sec    Clamshell with TheraBand  Yellow   x20   Marching  AROM;Right;15 reps      Modalities   Modalities  Vasopneumatic      Vasopneumatic   Number Minutes Vasopneumatic   15 minutes    Vasopnuematic Location   Knee    Vasopneumatic Pressure  Low    Vasopneumatic Temperature   34               PT Short Term Goals - 10/29/19 1436      PT SHORT TERM GOAL #1   Title  Independent with an initial HEP.    Time  4    Period  Weeks    Status  On-going      PT SHORT TERM GOAL #2   Title  Full active right knee extension.    Time  4    Period  Weeks    Status  On-going      PT SHORT TERM GOAL #3   Title  Walk 200 feet in clinic with walker.    Time  4    Period  Weeks    Status  On-going        PT Long Term Goals - 10/29/19 1436      PT LONG TERM GOAL #1   Title  Right active knee flexion to 105-110 degrees.    Time  8    Period  Weeks    Status  On-going      PT LONG TERM GOAL #2   Title  Perform 4 stairs with one railing.    Time  8    Period  Weeks    Status  On-going      PT LONG TERM GOAL #3   Title  Right hip and knee strength to 4+/5 to  increase stability for functional activities.    Time  8    Period  Weeks    Status  On-going            Plan - 11/12/19 1031    Clinical Impression Statement  Patient responded well to therapy session but with ongoing right knee pain. Patient reported an increase of soreness in lateral knee with hip abduction that reduced with rest. Patient instructed to continue HEP to which patient reported understanding. No adverse affects upon removal of modalities.    Personal Factors and Comorbidities  Comorbidity 1;Comorbidity 2    Comorbidities  Leukemia, Kyphoplasty, OA.    Examination-Activity Limitations  Bathing;Bed Mobility;Locomotion Level;Other;Transfers;Toileting;Stand;Stairs    Examination-Participation Restrictions  Cleaning;Community Activity;Other    Stability/Clinical Decision Making  Stable/Uncomplicated    Clinical Decision Making  Low    Rehab Potential  Fair    PT Frequency  2x / week    PT Duration  8 weeks    PT Treatment/Interventions  ADLs/Self Care Home Management;Cryotherapy;Electrical Stimulation;Gait training;Stair training;Functional mobility training;Therapeutic activities;Therapeutic exercise;Neuromuscular re-education;Manual techniques;Patient/family education;Passive range of motion;Vasopneumatic Device    PT Next Visit Plan  Nustep, PROM, AAROM, vasopneumatic.    Consulted and Agree with Plan of Care  Patient       Patient will benefit from skilled therapeutic intervention in order to improve the following deficits and impairments:  Abnormal gait, Difficulty walking, Decreased activity tolerance, Decreased mobility, Decreased range of motion, Decreased strength, Increased edema, Pain  Visit Diagnosis: Chronic pain of right knee  Stiffness of right knee, not elsewhere classified  Localized edema  Muscle weakness (generalized)     Problem List Patient Active Problem List   Diagnosis Date Noted  . Thoracic compression fracture ([place]) 02/14/2019  .  Sweating abnormality 10/11/2017  . Iron deficiency anemia 02/16/2017  . Pleural effusion 10/23/2016  . Solitary pulmonary nodule 10/23/2016  . Anemia 11/21/2015  . Bladder prolapse, female, acquired 11/21/2015  . IBS (irritable bowel syndrome) 11/21/2015  . Neutrophilic leukocytosis 0000000  . OA (osteoarthritis) of knee 11/21/2015  . CML (chronic myeloid leukemia) ([place]) 11/20/2015  . Biliary dyskinesia 12/04/2013  . Dyspepsia 11/30/2012  . Rib pain 11/01/2011  . Colon cancer screening 09/21/2011  . Lipoma 07/27/2011  . Lipoma of arm 07/27/2011  . Mass of arm 07/08/2011  . PUD 05/20/2010  . ABDOMINAL PAIN RIGHT UPPER QUADRANT 02/12/2010  . Hypothyroidism 02/11/2010  . NAUSEA 02/11/2010  . Emiliano Dyer OF 02/11/2010    Gabriela Eves, PT, DPT 11/12/2019, 10:44 AM  [facility] [place], [place], [address] Phone: [phone]   Fax:  [phone]  Name: Andrea Martinez MRN: [id number] Date of Birth: 07-27-1946

## 2019-11-14 ENCOUNTER — Encounter: Payer: Self-pay | Admitting: Physical Therapy

## 2019-11-14 ENCOUNTER — Ambulatory Visit: Payer: Medicare Other | Attending: Orthopedic Surgery | Admitting: Physical Therapy

## 2019-11-14 ENCOUNTER — Other Ambulatory Visit: Payer: Self-pay

## 2019-11-14 DIAGNOSIS — M25561 Pain in right knee: Secondary | ICD-10-CM | POA: Insufficient documentation

## 2019-11-14 DIAGNOSIS — G8929 Other chronic pain: Secondary | ICD-10-CM | POA: Diagnosis not present

## 2019-11-14 DIAGNOSIS — R600 Localized edema: Secondary | ICD-10-CM | POA: Insufficient documentation

## 2019-11-14 DIAGNOSIS — M25661 Stiffness of right knee, not elsewhere classified: Secondary | ICD-10-CM | POA: Insufficient documentation

## 2019-11-14 DIAGNOSIS — M6281 Muscle weakness (generalized): Secondary | ICD-10-CM | POA: Insufficient documentation

## 2019-11-14 NOTE — Therapy (Signed)
[facility] [place], [place], [address] Phone: [phone]   Fax:  [phone]  Physical Therapy Treatment  Patient Details  Name: Andrea Martinez MRN: [id number] Date of Birth: 1946-06-17 Referring Provider (PT): Gaynelle Arabian MD.   Encounter Date: 11/14/2019  PT End of Session - 11/14/19 1107    Visit Number  7    Number of Visits  16    Date for PT Re-Evaluation  01/24/20    Authorization Type  FOTO AT LEAST EVERY 5TH VISIT.  PROGRESS NOTE AT 10TH VISIT.  KX MODIFIER AFTER 15 VISITS.    PT Start Time  [phone]    PT Stop Time  1038    PT Time Calculation (min)  46 min    Activity Tolerance  Patient tolerated treatment well    Behavior During Therapy  WFL for tasks assessed/performed       Past Medical History:  Diagnosis Date  . Arthritis   . Chronic myelogenous leukemia (CML), BCR-ABL1-positive ([place]) 11/20/2015  . Gastric ulcer 2011   EGD, 5/11  . Hypothyroidism    not on meds, followed by Dr. Elyse Hsu  . Leukocytosis 11/20/2015  . Lipoma    left upper arm  . Pericardial effusion    a. HCAP complicated by pericardial effusion requiring pericardial window 10/2016 and large L pleural effusion requring VATS.  Marland Kitchen Pleural effusion    a. s/p VATS 2017.  Marland Kitchen Pneumonia 10/2016  . PONV (postoperative nausea and vomiting)   . Prolonged QT interval   . Urine incontinence   . UTI (lower urinary tract infection)    frequent    Past Surgical History:  Procedure Laterality Date  . BRAVO [place] STUDY  12/04/2012   Procedure: BRAVO [place];  Surgeon: [name], MD;  [facility];  Service: Endoscopy;  Laterality: N/A;  . CHOLECYSTECTOMY N/A 01/03/2014   Procedure: LAPAROSCOPIC CHOLECYSTECTOMY WITH INTRAOPERATIVE CHOLANGIOGRAM;  Surgeon: Joyice Faster. Cornett, MD;  Location: [place];  Service: General;  Laterality: N/A;  . COLONOSCOPY  01/2004   DR [facility], few small tics  . ESOPHAGOGASTRODUODENOSCOPY   02/2010   gastric ulcers  . ESOPHAGOGASTRODUODENOSCOPY  12/04/2012   [id number] gastritis (inflammation) was found in the gastric antrum; multiple biopsies The duodenal mucosa showed no abnormalities in the bulb and second portion of the duodenum  . KYPHOPLASTY N/A 02/14/2019   Procedure: KYPHOPLASTY T12 and L1;  Surgeon: Melina Schools, MD;  Location: [place];  Service: Orthopedics;  Laterality: N/A;  120 mins  . LIPOMA EXCISION  08/02/2011   left shoulder  . NOSE SURGERY    . PARTIAL HYSTERECTOMY     vaginal at age 14 years of age  . TOE DEBRIDEMENT Left 1962   lt great toe  . TOTAL KNEE ARTHROPLASTY Right 10/22/2019   Procedure: TOTAL KNEE ARTHROPLASTY;  Surgeon: Gaynelle Arabian, MD;  [facility];  Service: Orthopedics;  Laterality: Right;  53min  . VIDEO ASSISTED THORACOSCOPY (VATS)/EMPYEMA Left 10/25/2016   Procedure: VIDEO ASSISTED THORACOSCOPY (VATS), BRONCH,DRAINAGE OF PLEURAL EFFUSION,PERICARDIAL WINDOW WITH DRAINAGE OF PERICARDIAL FLUID, TEE;  Surgeon: [name], MD;  Location: [place];  Service: Thoracic;  Laterality: Left;  Marland Kitchen VIDEO BRONCHOSCOPY N/A 10/25/2016   Procedure: VIDEO BRONCHOSCOPY;  Surgeon: [name], MD;  Location: [place];  Service: Thoracic;  Laterality: N/A;    There were no vitals filed for this visit.  Subjective Assessment - 11/14/19 1106    Subjective  COVID-19  screen performed prior to patient entering clinic.  Pain about a 5.    Patient is accompained by:  Family member    Pertinent History  Leukemia, Kyphoplasty, OA.    How long can you stand comfortably?  Today, < two minutes.    How long can you walk comfortably?  See evaluation.    Patient Stated Goals  Get out of pain and walk.    Currently in Pain?  Yes    Pain Score  5     Pain Location  Knee    Pain Orientation  Right    Pain Descriptors / Indicators  Sore    Pain Type  Surgical pain    Pain Onset  1 to 4 weeks ago    Pain Frequency  Constant                        [facility] Adult PT Treatment/Exercise - 11/14/19 0001      Exercises   Exercises  Knee/Hip      Knee/Hip Exercises: Aerobic   Nustep  Level 4 moving seat forward x 2 to increase knee flexion x 15 minutes.      Modalities   Modalities  Vasopneumatic      Vasopneumatic   Number Minutes Vasopneumatic   15 minutes    Vasopnuematic Location   --   Right knee.   Vasopneumatic Pressure  Low      Manual Therapy   Passive ROM  Seated.  Low load long duration stretching into right knee flexion and extension x 8 minutes.               PT Short Term Goals - 10/29/19 1436      PT SHORT TERM GOAL #1   Title  Independent with an initial HEP.    Time  4    Period  Weeks    Status  On-going      PT SHORT TERM GOAL #2   Title  Full active right knee extension.    Time  4    Period  Weeks    Status  On-going      PT SHORT TERM GOAL #3   Title  Walk 200 feet in clinic with walker.    Time  4    Period  Weeks    Status  On-going        PT Long Term Goals - 10/29/19 1436      PT LONG TERM GOAL #1   Title  Right active knee flexion to 105-110 degrees.    Time  8    Period  Weeks    Status  On-going      PT LONG TERM GOAL #2   Title  Perform 4 stairs with one railing.    Time  8    Period  Weeks    Status  On-going      PT LONG TERM GOAL #3   Title  Right hip and knee strength to 4+/5 to increase stability for functional activities.    Time  8    Period  Weeks    Status  On-going            Plan - 11/14/19 1110    Clinical Impression Statement  Patient doing much better.  She is tolerating increase resistance on Nustep without complaint.    Personal Factors and Comorbidities  Comorbidity 1;Comorbidity 2    Comorbidities  Leukemia, Kyphoplasty, OA.  Examination-Activity Limitations  Bathing;Bed Mobility;Locomotion Level;Other;Transfers;Toileting;Stand;Stairs    Examination-Participation Restrictions  Cleaning;Community  Activity;Other    Stability/Clinical Decision Making  Stable/Uncomplicated    Rehab Potential  Fair    PT Frequency  2x / week    PT Duration  8 weeks    PT Treatment/Interventions  ADLs/Self Care Home Management;Cryotherapy;Electrical Stimulation;Gait training;Stair training;Functional mobility training;Therapeutic activities;Therapeutic exercise;Neuromuscular re-education;Manual techniques;Patient/family education;Passive range of motion;Vasopneumatic Device    PT Next Visit Plan  Nustep, PROM, AAROM, vasopneumatic.    Consulted and Agree with Plan of Care  Patient    Family Member Consulted  Husband, Daughter       Patient will benefit from skilled therapeutic intervention in order to improve the following deficits and impairments:  Abnormal gait, Difficulty walking, Decreased activity tolerance, Decreased mobility, Decreased range of motion, Decreased strength, Increased edema, Pain  Visit Diagnosis: Chronic pain of right knee  Stiffness of right knee, not elsewhere classified  Localized edema  Muscle weakness (generalized)     Problem List Patient Active Problem List   Diagnosis Date Noted  . Thoracic compression fracture ([place]) 02/14/2019  . Sweating abnormality 10/11/2017  . Iron deficiency anemia 02/16/2017  . Pleural effusion 10/23/2016  . Solitary pulmonary nodule 10/23/2016  . Anemia 11/21/2015  . Bladder prolapse, female, acquired 11/21/2015  . IBS (irritable bowel syndrome) 11/21/2015  . Neutrophilic leukocytosis 0000000  . OA (osteoarthritis) of knee 11/21/2015  . CML (chronic myeloid leukemia) ([place]) 11/20/2015  . Biliary dyskinesia 12/04/2013  . Dyspepsia 11/30/2012  . Rib pain 11/01/2011  . Colon cancer screening 09/21/2011  . Lipoma 07/27/2011  . Lipoma of arm 07/27/2011  . Mass of arm 07/08/2011  . PUD 05/20/2010  . ABDOMINAL PAIN RIGHT UPPER QUADRANT 02/12/2010  . Hypothyroidism 02/11/2010  . NAUSEA 02/11/2010  . Emiliano Dyer OF 02/11/2010     Tsugio Elison, [place]  MPT 11/14/2019, 11:23 AM  [facility] [address] [place], [place], [address] Phone: [phone]   Fax:  [phone]  Name: MARGERT SIMAN MRN: [id number] Date of Birth: February 28, 1946

## 2019-11-16 ENCOUNTER — Telehealth (HOSPITAL_COMMUNITY): Payer: Self-pay | Admitting: Pharmacy Technician

## 2019-11-16 ENCOUNTER — Encounter: Payer: Medicare Other | Admitting: Physical Therapy

## 2019-11-19 ENCOUNTER — Ambulatory Visit: Payer: Medicare Other | Admitting: Physical Therapy

## 2019-11-21 ENCOUNTER — Encounter: Payer: Medicare Other | Admitting: Physical Therapy

## 2019-11-22 ENCOUNTER — Inpatient Hospital Stay (HOSPITAL_COMMUNITY): Payer: Medicare Other | Attending: Hematology

## 2019-11-22 ENCOUNTER — Other Ambulatory Visit: Payer: Self-pay

## 2019-11-22 DIAGNOSIS — C9210 Chronic myeloid leukemia, BCR/ABL-positive, not having achieved remission: Secondary | ICD-10-CM | POA: Diagnosis not present

## 2019-11-22 DIAGNOSIS — Z9221 Personal history of antineoplastic chemotherapy: Secondary | ICD-10-CM | POA: Insufficient documentation

## 2019-11-22 DIAGNOSIS — E669 Obesity, unspecified: Secondary | ICD-10-CM | POA: Insufficient documentation

## 2019-11-22 DIAGNOSIS — Z79899 Other long term (current) drug therapy: Secondary | ICD-10-CM | POA: Diagnosis not present

## 2019-11-22 DIAGNOSIS — G479 Sleep disorder, unspecified: Secondary | ICD-10-CM | POA: Insufficient documentation

## 2019-11-22 DIAGNOSIS — R110 Nausea: Secondary | ICD-10-CM | POA: Diagnosis not present

## 2019-11-22 DIAGNOSIS — R61 Generalized hyperhidrosis: Secondary | ICD-10-CM | POA: Diagnosis not present

## 2019-11-22 LAB — COMPREHENSIVE METABOLIC PANEL
ALT: 19 U/L (ref 0–44)
AST: 29 U/L (ref 15–41)
Albumin: 3.5 g/dL (ref 3.5–5.0)
Alkaline Phosphatase: 149 U/L — ABNORMAL HIGH (ref 38–126)
Anion gap: 13 (ref 5–15)
BUN: 16 mg/dL (ref 8–23)
CO2: 23 mmol/L (ref 22–32)
Calcium: 9 mg/dL (ref 8.9–10.3)
Chloride: 103 mmol/L (ref 98–111)
Creatinine, Ser: 0.94 mg/dL (ref 0.44–1.00)
GFR calc Af Amer: >60 mL/min (ref >60)
GFR calc non Af Amer: 60 mL/min — ABNORMAL LOW (ref >60)
Glucose, Bld: 115 mg/dL — ABNORMAL HIGH (ref 70–99)
Potassium: 4.5 mmol/L (ref 3.5–5.1)
Sodium: 139 mmol/L (ref 135–145)
Total Bilirubin: 0.6 mg/dL (ref 0.3–1.2)
Total Protein: 7.6 g/dL (ref 6.5–8.1)

## 2019-11-22 LAB — CBC WITH DIFFERENTIAL/PLATELET
Abs Immature Granulocytes: 0.13 10*3/uL — ABNORMAL HIGH (ref 0.00–0.07)
Basophils Absolute: 0 10*3/uL (ref 0.0–0.1)
Basophils Relative: 0 %
Eosinophils Absolute: 0.3 10*3/uL (ref 0.0–0.5)
Eosinophils Relative: 2 %
HCT: 35.2 % — ABNORMAL LOW (ref 36.0–46.0)
Hemoglobin: 10.3 g/dL — ABNORMAL LOW (ref 12.0–15.0)
Immature Granulocytes: 1 %
Lymphocytes Relative: 9 %
Lymphs Abs: 1.4 10*3/uL (ref 0.7–4.0)
MCH: 27.2 pg (ref 26.0–34.0)
MCHC: 29.3 g/dL — ABNORMAL LOW (ref 30.0–36.0)
MCV: 93.1 fL (ref 80.0–100.0)
Monocytes Absolute: 0.9 10*3/uL (ref 0.1–1.0)
Monocytes Relative: 6 %
Neutro Abs: 11.7 10*3/uL — ABNORMAL HIGH (ref 1.7–7.7)
Neutrophils Relative %: 82 %
Platelets: 359 10*3/uL (ref 150–400)
RBC: 3.78 MIL/uL — ABNORMAL LOW (ref 3.87–5.11)
RDW: 14.6 % (ref 11.5–15.5)
WBC: 14.5 10*3/uL — ABNORMAL HIGH (ref 4.0–10.5)
nRBC: 0 % (ref 0.0–0.2)

## 2019-11-23 ENCOUNTER — Ambulatory Visit: Payer: Medicare Other | Admitting: Physical Therapy

## 2019-11-23 ENCOUNTER — Other Ambulatory Visit: Payer: Self-pay

## 2019-11-23 ENCOUNTER — Encounter: Payer: Self-pay | Admitting: Physical Therapy

## 2019-11-23 DIAGNOSIS — M6281 Muscle weakness (generalized): Secondary | ICD-10-CM

## 2019-11-23 DIAGNOSIS — M25561 Pain in right knee: Secondary | ICD-10-CM | POA: Diagnosis not present

## 2019-11-23 DIAGNOSIS — G8929 Other chronic pain: Secondary | ICD-10-CM | POA: Diagnosis not present

## 2019-11-23 DIAGNOSIS — M25661 Stiffness of right knee, not elsewhere classified: Secondary | ICD-10-CM

## 2019-11-23 DIAGNOSIS — R600 Localized edema: Secondary | ICD-10-CM

## 2019-11-23 NOTE — Therapy (Signed)
[facility] [place], [place], [address] Phone: [phone]   Fax:  [phone]  Physical Therapy Treatment  Patient Details  Name: Andrea Martinez MRN: [id number] Date of Birth: 12-18-1945 Referring Provider (PT): Gaynelle Arabian MD.   Encounter Date: 11/23/2019  PT End of Session - 11/23/19 0955    Visit Number  8    Number of Visits  16    Date for PT Re-Evaluation  01/24/20    Authorization Type  FOTO AT LEAST EVERY 5TH VISIT.  PROGRESS NOTE AT 10TH VISIT.  KX MODIFIER AFTER 15 VISITS.    PT Start Time  [phone]    PT Stop Time  1032    PT Time Calculation (min)  45 min    Equipment Utilized During Treatment  Other (comment)   FWW   Activity Tolerance  Patient tolerated treatment well    Behavior During Therapy  WFL for tasks assessed/performed       Past Medical History:  Diagnosis Date  . Arthritis   . Chronic myelogenous leukemia (CML), BCR-ABL1-positive ([place]) 11/20/2015  . Gastric ulcer 2011   EGD, 5/11  . Hypothyroidism    not on meds, followed by Dr. Elyse Hsu  . Leukocytosis 11/20/2015  . Lipoma    left upper arm  . Pericardial effusion    a. HCAP complicated by pericardial effusion requiring pericardial window 10/2016 and large L pleural effusion requring VATS.  Marland Kitchen Pleural effusion    a. s/p VATS 2017.  Marland Kitchen Pneumonia 10/2016  . PONV (postoperative nausea and vomiting)   . Prolonged QT interval   . Urine incontinence   . UTI (lower urinary tract infection)    frequent    Past Surgical History:  Procedure Laterality Date  . BRAVO [place] STUDY  12/04/2012   Procedure: BRAVO [place];  Surgeon: [name], MD;  [facility];  Service: Endoscopy;  Laterality: N/A;  . CHOLECYSTECTOMY N/A 01/03/2014   Procedure: LAPAROSCOPIC CHOLECYSTECTOMY WITH INTRAOPERATIVE CHOLANGIOGRAM;  Surgeon: Joyice Faster. Cornett, MD;  Location: [place];  Service: General;  Laterality: N/A;  . COLONOSCOPY   01/2004   DR [facility], few small tics  . ESOPHAGOGASTRODUODENOSCOPY  02/2010   gastric ulcers  . ESOPHAGOGASTRODUODENOSCOPY  12/04/2012   [id number] gastritis (inflammation) was found in the gastric antrum; multiple biopsies The duodenal mucosa showed no abnormalities in the bulb and second portion of the duodenum  . KYPHOPLASTY N/A 02/14/2019   Procedure: KYPHOPLASTY T12 and L1;  Surgeon: Melina Schools, MD;  Location: [place];  Service: Orthopedics;  Laterality: N/A;  120 mins  . LIPOMA EXCISION  08/02/2011   left shoulder  . NOSE SURGERY    . PARTIAL HYSTERECTOMY     vaginal at age 36 years of age  . TOE DEBRIDEMENT Left 1962   lt great toe  . TOTAL KNEE ARTHROPLASTY Right 10/22/2019   Procedure: TOTAL KNEE ARTHROPLASTY;  Surgeon: Gaynelle Arabian, MD;  [facility];  Service: Orthopedics;  Laterality: Right;  57min  . VIDEO ASSISTED THORACOSCOPY (VATS)/EMPYEMA Left 10/25/2016   Procedure: VIDEO ASSISTED THORACOSCOPY (VATS), BRONCH,DRAINAGE OF PLEURAL EFFUSION,PERICARDIAL WINDOW WITH DRAINAGE OF PERICARDIAL FLUID, TEE;  Surgeon: [name], MD;  Location: [place];  Service: Thoracic;  Laterality: Left;  Marland Kitchen VIDEO BRONCHOSCOPY N/A 10/25/2016   Procedure: VIDEO BRONCHOSCOPY;  Surgeon: [name], MD;  Location: [place];  Service: Thoracic;  Laterality: N/A;    There were no vitals filed for this visit.  Subjective Assessment - 11/23/19 0948    Subjective  COVID-19 screen performed prior to patient entering clinic. Has been out due to leukemia problems.    Pertinent History  Leukemia, Kyphoplasty, OA.    How long can you stand comfortably?  Today, < two minutes.    How long can you walk comfortably?  See evaluation.    Patient Stated Goals  Get out of pain and walk.    Currently in Pain?  Yes    Pain Location  Knee    Pain Orientation  Right    Pain Descriptors / Indicators  Sore    Pain Type  Surgical pain    Pain Onset  1 to 4 weeks ago         [facility] PT Assessment -  11/23/19 0001      Assessment   Medical Diagnosis  Right total knee replacement.    Referring Provider (PT)  Gaynelle Arabian MD.    Onset Date/Surgical Date  10/22/19    Next MD Visit  11/27/2019      Precautions   Precautions  Fall      Restrictions   Weight Bearing Restrictions  No                   [facility] Adult PT Treatment/Exercise - 11/23/19 0001      Knee/Hip Exercises: Aerobic   Nustep  L4, seat 11-10 x17 min      Knee/Hip Exercises: Standing   Hip Flexion  AROM;Right;20 reps;Knee bent    Hip Abduction  AROM;Right;20 reps;Knee straight    Forward Step Up  Right;10 reps;Hand Hold: 2;Step Height: 6"   VCs and demo to avoid hip circumduction   Functional Squat  15 reps   mini squat     Knee/Hip Exercises: Seated   Long Arc Quad  AROM;Right;2 sets;10 reps      Modalities   Modalities  Vasopneumatic      Vasopneumatic   Number Minutes Vasopneumatic   15 minutes    Vasopnuematic Location   Knee    Vasopneumatic Pressure  Medium    Vasopneumatic Temperature   34               PT Short Term Goals - 10/29/19 1436      PT SHORT TERM GOAL #1   Title  Independent with an initial HEP.    Time  4    Period  Weeks    Status  On-going      PT SHORT TERM GOAL #2   Title  Full active right knee extension.    Time  4    Period  Weeks    Status  On-going      PT SHORT TERM GOAL #3   Title  Walk 200 feet in clinic with walker.    Time  4    Period  Weeks    Status  On-going        PT Long Term Goals - 10/29/19 1436      PT LONG TERM GOAL #1   Title  Right active knee flexion to 105-110 degrees.    Time  8    Period  Weeks    Status  On-going      PT LONG TERM GOAL #2   Title  Perform 4 stairs with one railing.    Time  8    Period  Weeks    Status  On-going      PT LONG  TERM GOAL #3   Title  Right hip and knee strength to 4+/5 to increase stability for functional activities.    Time  8    Period  Weeks    Status  On-going             Plan - 11/23/19 1047    Clinical Impression Statement  Patient presented in clinic with reports of weakness and symptoms related to chronic leukemia. Patient able to be introduced to new standing exercises but limited by fatigue. Patient worried in regards to difficulty with AROM hip flexion. Patient denied wanting to gait train with [facility] today in clinic. Normal modalities response noted following removal of the modalities.    Personal Factors and Comorbidities  Comorbidity 1;Comorbidity 2    Comorbidities  Leukemia, Kyphoplasty, OA.    Examination-Activity Limitations  Bathing;Bed Mobility;Locomotion Level;Other;Transfers;Toileting;Stand;Stairs    Examination-Participation Restrictions  Cleaning;Community Activity;Other    Stability/Clinical Decision Making  Stable/Uncomplicated    Rehab Potential  Fair    PT Frequency  2x / week    PT Duration  8 weeks    PT Treatment/Interventions  ADLs/Self Care Home Management;Cryotherapy;Electrical Stimulation;Gait training;Stair training;Functional mobility training;Therapeutic activities;Therapeutic exercise;Neuromuscular re-education;Manual techniques;Patient/family education;Passive range of motion;Vasopneumatic Device    PT Next Visit Plan  Nustep, PROM, AAROM, vasopneumatic.    Consulted and Agree with Plan of Care  Patient       Patient will benefit from skilled therapeutic intervention in order to improve the following deficits and impairments:  Abnormal gait, Difficulty walking, Decreased activity tolerance, Decreased mobility, Decreased range of motion, Decreased strength, Increased edema, Pain  Visit Diagnosis: Chronic pain of right knee  Stiffness of right knee, not elsewhere classified  Localized edema  Muscle weakness (generalized)     Problem List Patient Active Problem List   Diagnosis Date Noted  . Thoracic compression fracture ([place]) 02/14/2019  . Sweating abnormality 10/11/2017  . Iron deficiency anemia  02/16/2017  . Pleural effusion 10/23/2016  . Solitary pulmonary nodule 10/23/2016  . Anemia 11/21/2015  . Bladder prolapse, female, acquired 11/21/2015  . IBS (irritable bowel syndrome) 11/21/2015  . Neutrophilic leukocytosis 0000000  . OA (osteoarthritis) of knee 11/21/2015  . CML (chronic myeloid leukemia) ([place]) 11/20/2015  . Biliary dyskinesia 12/04/2013  . Dyspepsia 11/30/2012  . Rib pain 11/01/2011  . Colon cancer screening 09/21/2011  . Lipoma 07/27/2011  . Lipoma of arm 07/27/2011  . Mass of arm 07/08/2011  . PUD 05/20/2010  . ABDOMINAL PAIN RIGHT UPPER QUADRANT 02/12/2010  . Hypothyroidism 02/11/2010  . NAUSEA 02/11/2010  . UTI'S, HX OF 02/11/2010    Standley Brooking, PTA 11/23/2019, 10:54 AM  [facility] [address] [place], [place], [address] Phone: [phone]   Fax:  [phone]  Name: QUETZAL LAGLE MRN: [id number] Date of Birth: November 23, 1946

## 2019-11-26 ENCOUNTER — Ambulatory Visit: Payer: Medicare Other | Admitting: Physical Therapy

## 2019-11-27 DIAGNOSIS — Z96651 Presence of right artificial knee joint: Secondary | ICD-10-CM | POA: Diagnosis not present

## 2019-11-27 DIAGNOSIS — Z471 Aftercare following joint replacement surgery: Secondary | ICD-10-CM | POA: Diagnosis not present

## 2019-11-28 ENCOUNTER — Encounter: Payer: Medicare Other | Admitting: Physical Therapy

## 2019-11-29 ENCOUNTER — Inpatient Hospital Stay (HOSPITAL_BASED_OUTPATIENT_CLINIC_OR_DEPARTMENT_OTHER): Payer: Medicare Other | Admitting: Hematology

## 2019-11-29 ENCOUNTER — Other Ambulatory Visit: Payer: Self-pay

## 2019-11-29 VITALS — BP 110/97 | HR 80 | Temp 97.5°F | Resp 16 | Wt 219.0 lb

## 2019-11-29 DIAGNOSIS — R110 Nausea: Secondary | ICD-10-CM | POA: Diagnosis not present

## 2019-11-29 DIAGNOSIS — C9210 Chronic myeloid leukemia, BCR/ABL-positive, not having achieved remission: Secondary | ICD-10-CM | POA: Diagnosis not present

## 2019-11-29 DIAGNOSIS — Z79899 Other long term (current) drug therapy: Secondary | ICD-10-CM | POA: Diagnosis not present

## 2019-11-29 DIAGNOSIS — G479 Sleep disorder, unspecified: Secondary | ICD-10-CM | POA: Diagnosis not present

## 2019-11-29 DIAGNOSIS — R61 Generalized hyperhidrosis: Secondary | ICD-10-CM | POA: Diagnosis not present

## 2019-11-29 DIAGNOSIS — Z9221 Personal history of antineoplastic chemotherapy: Secondary | ICD-10-CM | POA: Diagnosis not present

## 2019-11-29 LAB — BCR-ABL1, CML/ALL, PCR, QUANT: Interpretation (BCRAL):: NEGATIVE

## 2019-11-29 NOTE — Progress Notes (Signed)
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 Florene Glen [facility]   11/21/2015 -  Chemotherapy   Hydrea 1000 mg and Allopurinol 300 mg daily (due to high uric acid level)   11/21/2015 Tumor Marker   BCR P210 Ratio of Fusion to Control, Blood 0.8941    P210 IS % Ratio, Blood 85.834%     01/02/2016 - 10/23/2016 Chemotherapy   Tasigna   01/08/2016 Miscellaneous   EKG- QT/QTc: 382/440 ms   01/16/2016 Miscellaneous   EKG- QT/QTc: 364/419   03/30/2016 Tumor Marker   b2a2 transcript: 0.317 b3a2 transcript: 0.194   10/23/2016 - 11/05/2016 Hospital Admission   large Left Pleural Effusion and Pericardial Effusion that is now s/p VATS with Drainage, and Drainage of Pericardial Fluid with Window and Biopsy of Pericardium   12/06/2016 -  Chemotherapy   Tasigna restarted    05/24/2017 Adverse Reaction   Prolonged QTc on EKG; likely d/t chemo. Tasigna ON HOLD until next visit.    06/10/2017 -  Chemotherapy   Resumed Tasigna   07/13/2017 Miscellaneous   Evaluated by Cardiology for prolonged QTc. Per Dr. Bronson Ing, patient's QT intervals are not prolonged to the point of potentially promoting a ventricular arrhythmia. He thinks she can continue on current medical therapy for her CML with serial ECG monitoring.   12/27/2017 Miscellaneous   EKG: QT/QTc 398/435       INTERVAL HISTORY:  Andrea Martinez 73 y.o. female seen for follow-up of her chronic myeloid leukemia.  Continues to have occasional night sweats.  Otherwise reports appetite of 50%.  Energy levels are 25%.  She had  right total knee arthroplasty on 10/22/2019.  She is currently doing physical therapy and is feeling well.  Has occasional nausea which is controlled well with Compazine.  Sleep problems are also stable.  REVIEW OF SYSTEMS:  Review of Systems  Constitutional: Negative for fatigue.       Day sweats, night sweats  HENT:  Negative.   Eyes: Negative.   Respiratory: Negative.   Gastrointestinal: Positive for nausea.  Endocrine: Negative for hot flashes.  Genitourinary: Negative.    Musculoskeletal: Positive for arthralgias.  Skin: Negative.   Hematological: Negative.   Psychiatric/Behavioral: Positive for sleep disturbance.  All other systems reviewed and are negative.    PAST MEDICAL/SURGICAL HISTORY:  Past Medical History:  Diagnosis Date  . Arthritis   . 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 Gastric ulcer 2011   EGD, 5/11  . Hypothyroidism    not on meds, followed by Dr. Elyse Hsu  . Leukocytosis 11/20/2015  . Lipoma    left upper arm  . Pericardial effusion    a. HCAP complicated by pericardial effusion requiring pericardial window 10/2016 and large L pleural effusion requring VATS.  Marland Kitchen Pleural effusion    a. s/p VATS 2017.  Marland Kitchen Pneumonia 10/2016  . PONV (postoperative nausea and vomiting)   . Prolonged QT interval   . Urine incontinence   . UTI (lower urinary tract infection)    frequent   Past Surgical History:  Procedure Laterality Date  . BRAVO  [facility] STUDY  12/04/2012   Procedure: BRAVO [place];  Surgeon: [name], MD;  [facility];  Service: Endoscopy;  Laterality: N/A;  . CHOLECYSTECTOMY N/A 01/03/2014   Procedure: LAPAROSCOPIC CHOLECYSTECTOMY WITH INTRAOPERATIVE CHOLANGIOGRAM;  Surgeon: Joyice Faster. Cornett, MD;  Location: [facility];  Service: General;  Laterality: N/A;  . COLONOSCOPY  01/2004   [facility], few small tics  . ESOPHAGOGASTRODUODENOSCOPY  02/2010   gastric ulcers  .  ESOPHAGOGASTRODUODENOSCOPY  12/04/2012   [id number] gastritis (inflammation) was found in the gastric antrum; multiple biopsies The duodenal mucosa showed no abnormalities in the bulb and second portion of the duodenum  . KYPHOPLASTY N/A 02/14/2019   Procedure: KYPHOPLASTY T12 and L1;  Surgeon: Melina Schools, MD;  Location: [facility];  Service: Orthopedics;  Laterality: N/A;  120 mins  . LIPOMA EXCISION  08/02/2011   left shoulder  . NOSE SURGERY    . PARTIAL HYSTERECTOMY     vaginal at age 23 years of age  . TOE DEBRIDEMENT Left 1962   lt great toe  . TOTAL KNEE ARTHROPLASTY Right 10/22/2019   Procedure: TOTAL KNEE ARTHROPLASTY;  Surgeon: Gaynelle Arabian, MD;  Location: [facility];  Service: Orthopedics;  Laterality: Right;  47min  . VIDEO ASSISTED THORACOSCOPY (VATS)/EMPYEMA Left 10/25/2016   Procedure: VIDEO ASSISTED THORACOSCOPY (VATS), BRONCH,DRAINAGE OF PLEURAL EFFUSION,PERICARDIAL WINDOW WITH DRAINAGE OF PERICARDIAL FLUID, TEE;  Surgeon: [name], MD;  Location: [facility];  Service: Thoracic;  Laterality: Left;  Marland Kitchen VIDEO BRONCHOSCOPY N/A 10/25/2016   Procedure: VIDEO BRONCHOSCOPY;  Surgeon: [name], MD;  Location: [facility];  Service: Thoracic;  Laterality: N/A;     SOCIAL HISTORY:  Social History   Socioeconomic History  . Marital status: Married    Spouse name: Not on file  . Number of children: 2  . Years of education: 12th grade  . Highest education level: Not on file  Occupational History  . Occupation: retired    Fish farm manager: UENPLOYED    Employer: RETIRED  Tobacco Use  . Smoking status: Never Smoker  . Smokeless tobacco: Never Used  Substance and Sexual Activity  . Alcohol use: No    Alcohol/week: 0.0 standard drinks  . Drug use: No  . Sexual activity: Yes    Partners: Male    Birth control/protection: None  Other Topics Concern  . Not on file  Social History Narrative  . Not on file   Social Determinants of Health   Financial Resource Strain:   .  Difficulty of Paying Living Expenses: Not on file  Food Insecurity:   . Worried About Charity fundraiser in the Last Year: Not on file  . Ran Out of Food in the Last Year: Not on file  Transportation Needs:   . Lack of Transportation (Medical): Not on file  . Lack of Transportation (Non-Medical): Not on file  Physical Activity:   . Days of Exercise per Week: Not on file  . Minutes of Exercise per Session: Not on file  Stress:   . Feeling of Stress : Not on file  Social Connections:   . Frequency of Communication with Friends and Family: Not on file  . Frequency of Social Gatherings with Friends and Family: Not on file  . Attends Religious Services: Not on file  . Active Member of Clubs or Organizations: Not on file  . Attends Archivist Meetings: Not on file  . Marital Status: Not on file  Intimate Partner  Violence:   . Fear of Current or Ex-Partner: Not on file  . Emotionally Abused: Not on file  . Physically Abused: Not on file  . Sexually Abused: Not on file    FAMILY HISTORY:  Family History  Problem Relation Age of Onset  . Lung cancer Father   . COPD Mother   . Colon cancer Neg Hx   . Anesthesia problems Neg Hx   . Hypotension Neg Hx   . Malignant hyperthermia Neg Hx   . Pseudochol deficiency Neg Hx     CURRENT MEDICATIONS:  Outpatient Encounter Medications as of 11/29/2019  Medication Sig Note  . cetirizine (ZYRTEC) 10 MG tablet Take 10 mg by mouth daily.    Marland Kitchen escitalopram (LEXAPRO) 20 MG tablet Take 1.5 tablets (30 mg total) by mouth daily.   Marland Kitchen gabapentin (NEURONTIN) 100 MG capsule Take 2 capsules (200 mg total) by mouth 3 (three) times daily. Take two 100 mg capsules three times a day for two weeks following surgery.Then take two 100 mg capsules two times a day for two weeks. Then take two 100 mg capsules once a day for two weeks.Then discontinue the Gabapentin.   Marland Kitchen lactose free nutrition (BOOST) LIQD Take 237 mLs by mouth daily.   Marland Kitchen levothyroxine  (SYNTHROID) 125 MCG tablet Take 1 tablet (125 mcg total) by mouth daily before breakfast.   . nilotinib (TASIGNA) 150 MG capsule Take 2 capsules (300 mg total) by mouth every 12 (twelve) hours. (Patient taking differently: Take 300 mg by mouth every 12 (twelve) hours. )   . rivaroxaban (XARELTO) 10 MG TABS tablet Take 1 tablet (10 mg total) by mouth daily with breakfast for 20 days. Take one tablet Xarelto once a day for three weeks following surgery. Then change to one baby Aspirin (81 mg) once a day for three weeks. Then discontinue Aspirin.   . [DISCONTINUED] Cholecalciferol (VITAMIN D) 50 MCG (2000 UT) tablet TAKE 1 CAPSULE (2,000 UNITS TOTAL) BY MOUTH DAILY WITH BREAKFAST.   . [DISCONTINUED] methocarbamol (ROBAXIN) 500 MG tablet Take 1 tablet (500 mg total) by mouth every 6 (six) hours as needed for muscle spasms.   . [DISCONTINUED] Multiple Vitamins-Minerals (CENTRUM SILVER PO) Take 1 capsule by mouth daily. 10/18/2019: On hold for the procedure. Last dose 10/16/19  . [DISCONTINUED] oxyCODONE (OXY IR/ROXICODONE) 5 MG immediate release tablet Take 1 tablet (5 mg total) by mouth every 6 (six) hours as needed for moderate pain (pain score 4-6).   . [DISCONTINUED] Probiotic CAPS Take 2 capsules by mouth daily.   . [DISCONTINUED] traMADol (ULTRAM) 50 MG tablet Take 1 tablet (50 mg total) by mouth 2 (two) times daily as needed. (Patient not taking: Reported on 10/10/2019)   . [DISCONTINUED] vitamin B-12 (CYANOCOBALAMIN) 1000 MCG tablet Take 1,000 mcg by mouth daily.  10/18/2019: On hold for the procedure. Last dose 10/16/19  . [DISCONTINUED] Vitamin E 180 MG CAPS Take 360 mg by mouth daily.     No facility-administered encounter medications on file as of 11/29/2019.    ALLERGIES:  Allergies  Allergen Reactions  . Codeine Nausea Only and Other (See Comments)    Headache  . Sulfonamide Derivatives Nausea Only and Other (See Comments)    Headache  . Esomeprazole Magnesium Nausea Only  . Fluconazole  Rash    Redness and blistering on left thigh     PHYSICAL EXAM:  ECOG Performance status: 2   Vitals:   11/29/19 1122  BP: (!) 110/97  Pulse: 80  Resp:  16  Temp: (!) 97.5 F (36.4 C)  SpO2: 100%   Filed Weights   11/29/19 1122  Weight: 219 lb (99.3 kg)    Physical Exam Constitutional:      Appearance: Normal appearance. She is obese.  HENT:     Head: Normocephalic.     Nose: Nose normal.     Mouth/Throat:     Mouth: Mucous membranes are moist.     Pharynx: Oropharynx is clear.  Eyes:     Pupils: Pupils are equal, round, and reactive to light.  Cardiovascular:     Rate and Rhythm: Normal rate and regular rhythm.  Pulmonary:     Effort: Pulmonary effort is normal.     Breath sounds: Normal breath sounds.  Abdominal:     General: Bowel sounds are normal.     Palpations: Abdomen is soft.  Musculoskeletal:     Cervical back: Normal range of motion.     Right lower leg: Edema present.     Left lower leg: Edema present.  Skin:    General: Skin is warm and dry.  Neurological:     General: No focal deficit present.     Mental Status: She is oriented to person, place, and time. Mental status is at baseline.  Psychiatric:        Mood and Affect: Mood normal.        Behavior: Behavior normal.        Thought Content: Thought content normal.        Judgment: Judgment normal.      LABORATORY DATA:  I have reviewed the labs as listed.  CBC    Component Value Date/Time   WBC 14.5 (H) 11/22/2019 1112   RBC 3.78 (L) 11/22/2019 1112   HGB 10.3 (L) 11/22/2019 1112   HCT 35.2 (L) 11/22/2019 1112   PLT 359 11/22/2019 1112   MCV 93.1 11/22/2019 1112   MCH 27.2 11/22/2019 1112   MCHC 29.3 (L) 11/22/2019 1112   RDW 14.6 11/22/2019 1112   LYMPHSABS 1.4 11/22/2019 1112   MONOABS 0.9 11/22/2019 1112   EOSABS 0.3 11/22/2019 1112   BASOSABS 0.0 11/22/2019 1112   CMP Latest Ref Rng & Units 11/22/2019 10/23/2019 10/18/2019  Glucose 70 - 99 mg/dL 115(H) 132(H) 109(H)   BUN 8 - 23 mg/dL 16 20 22   Creatinine 0.44 - 1.00 mg/dL 0.94 0.87 0.90  Sodium 135 - 145 mmol/L 139 139 141  Potassium 3.5 - 5.1 mmol/L 4.5 5.3(H) 4.5  Chloride 98 - 111 mmol/L 103 107 104  CO2 22 - 32 mmol/L 23 26 27   Calcium 8.9 - 10.3 mg/dL 9.0 8.5(L) 9.3  Total Protein 6.5 - 8.1 g/dL 7.6 - 7.9  Total Bilirubin 0.3 - 1.2 mg/dL 0.6 - 0.8  Alkaline Phos 38 - 126 U/L 149(H) - 163(H)  AST 15 - 41 U/L 29 - 24  ALT 0 - 44 U/L 19 - 19       DIAGNOSTIC IMAGING:  I have independently reviewed the scans and discussed with the patient.    ASSESSMENT & PLAN:   CML (chronic myeloid leukemia) ([place]) 1.  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 -She developed left pleural and pericardial effusion, requiring pericardial window on 10/25/2016, fluid cytology negative. -She was started back on Nilotinib 300 mg every 12 hours on 12/06/2016. -She is tolerating Nilotinib very well. -We reviewed labs from 11/22/2019.  White count is elevated at 14 with elevated neutrophil count. -However BCR/ABL by quantitative PCR  was undetectable. -Hence we will continue same dose of Nilotinib at this time.  I plan to see her back in 3 months with repeat labs 1 week prior.  2.  Daytime sweats: -She reported daytime sweats, predominantly on the face and back of the neck, at least 2 episodes per day for past 2 years. - Jak 2 V6 36F mutation testing and reflex testing was negative for leukocytosis. - CT of the chest on 06/14/2019 did not show any evidence of malignancy.  Hepatic morphology suspicious for cirrhosis.  Similar appearance of scattered pulmonary nodules considered benign. -CT abdomen and pelvis on 12/16/2018 shows normal-sized spleen with no adenopathy.  3.  Right knee pain: -She had knee replacement done and is currently doing therapy.  Total time spent is 25 minutes with more than 50% of time spent face-to-face discussing lab results, treatment plan, counseling and coordination of care.     Orders placed this encounter:  Orders Placed This Encounter  Procedures  . CBC with Differential/Platelet  . Comprehensive metabolic panel  . BCR-ABL1, CML/ALL, PCR, QUANT      Derek Jack, MD [place] [phone]

## 2019-11-29 NOTE — Patient Instructions (Signed)
[place] at [facility] Discharge Instructions  You were seen today by Dr. Delton Coombes. He went over your recent lab results. Call us the week after Christmas for lab results. He will see you back in 3 months for labs and follow up.   Thank you for choosing [facility] at [facility] to provide your oncology and hematology care.  To afford each patient quality time with our provider, please arrive at least 15 minutes before your scheduled appointment time.   If you have a lab appointment with the [facility] please come in thru the  Main Entrance and check in at the main information desk  You need to re-schedule your appointment should you arrive 10 or more minutes late.  We strive to give you quality time with our providers, and arriving late affects you and other patients whose appointments are after yours.  Also, if you no show three or more times for appointments you may be dismissed from the clinic at the providers discretion.     Again, thank you for choosing [facility].  Our hope is that these requests will decrease the amount of time that you wait before being seen by our physicians.       _____________________________________________________________  Should you have questions after your visit to [facility], please contact our office at [phone] between the hours of 8:00 a.m. and 4:30 p.m.  Voicemails left after 4:00 p.m. will not be returned until the following business day.  For prescription refill requests, have your pharmacy contact our office and allow 72 hours.    Cancer Center Support Programs:   > Cancer Support Group  2nd Tuesday of the month 1pm-2pm, Journey Room

## 2019-11-29 NOTE — Telephone Encounter (Signed)
Oral Oncology Patient Advocate Encounter  Received communication from [employer] that the patient's eligibility in the patient assistance program was due for re-enrollment.  The renewal application has been completed and faxed in an effort to keep the patient's out of pocket expense for Tasigna at $0.     Application completed and faxed to [phone] on 11/16/2019.    [employer] Patient [place] phone number for follow up is [phone].    This encounter will be updated until final determination.  [place] Patient [place] Phone [phone] Fax [phone]

## 2019-11-30 ENCOUNTER — Encounter: Payer: Medicare Other | Admitting: Physical Therapy

## 2019-12-04 ENCOUNTER — Ambulatory Visit: Payer: Medicare Other | Admitting: Physical Therapy

## 2019-12-10 ENCOUNTER — Ambulatory Visit: Payer: Medicare Other | Admitting: Physical Therapy

## 2019-12-12 ENCOUNTER — Encounter: Payer: Medicare Other | Admitting: Physical Therapy

## 2019-12-13 ENCOUNTER — Other Ambulatory Visit: Payer: Self-pay | Admitting: "Endocrinology

## 2019-12-13 DIAGNOSIS — E039 Hypothyroidism, unspecified: Secondary | ICD-10-CM

## 2019-12-16 ENCOUNTER — Encounter (HOSPITAL_COMMUNITY): Payer: Self-pay | Admitting: Hematology

## 2019-12-16 NOTE — Assessment & Plan Note (Signed)
1.  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 -She developed left pleural and pericardial effusion, requiring pericardial window on 10/25/2016, fluid cytology negative. -She was started back on Nilotinib 300 mg every 12 hours on 12/06/2016. -She is tolerating Nilotinib very well. -We reviewed labs from 11/22/2019.  White count is elevated at 14 with elevated neutrophil count. -However BCR/ABL by quantitative PCR was undetectable. -Hence we will continue same dose of Nilotinib at this time.  I plan to see her back in 3 months with repeat labs 1 week prior.  2.  Daytime sweats: -She reported daytime sweats, predominantly on the face and back of the neck, at least 2 episodes per day for past 2 years. - Jak 2 V6 51F mutation testing and reflex testing was negative for leukocytosis. - CT of the chest on 06/14/2019 did not show any evidence of malignancy.  Hepatic morphology suspicious for cirrhosis.  Similar appearance of scattered pulmonary nodules considered benign. -CT abdomen and pelvis on 12/16/2018 shows normal-sized spleen with no adenopathy.  3.  Right knee pain: -She had knee replacement done and is currently doing therapy.

## 2019-12-17 ENCOUNTER — Encounter: Payer: Self-pay | Admitting: Physical Therapy

## 2019-12-17 ENCOUNTER — Ambulatory Visit: Payer: Medicare Other | Attending: Orthopedic Surgery | Admitting: Physical Therapy

## 2019-12-17 ENCOUNTER — Other Ambulatory Visit: Payer: Self-pay

## 2019-12-17 DIAGNOSIS — R600 Localized edema: Secondary | ICD-10-CM

## 2019-12-17 DIAGNOSIS — G8929 Other chronic pain: Secondary | ICD-10-CM | POA: Insufficient documentation

## 2019-12-17 DIAGNOSIS — M25561 Pain in right knee: Secondary | ICD-10-CM | POA: Insufficient documentation

## 2019-12-17 DIAGNOSIS — M25661 Stiffness of right knee, not elsewhere classified: Secondary | ICD-10-CM | POA: Insufficient documentation

## 2019-12-17 DIAGNOSIS — M6281 Muscle weakness (generalized): Secondary | ICD-10-CM | POA: Diagnosis present

## 2019-12-17 NOTE — Therapy (Signed)
[facility] [place], [place], [address] Phone: [phone]   Fax:  [phone]  Physical Therapy Treatment  Patient Details  Name: Andrea Martinez MRN: [id number] Date of Birth: Sep 12, 1946 Referring Provider (PT): Gaynelle Arabian MD.   Encounter Date: 12/17/2019  PT End of Session - 12/17/19 0950    Visit Number  9    Number of Visits  16    Date for PT Re-Evaluation  01/24/20    Authorization Type  FOTO AT LEAST EVERY 5TH VISIT.  PROGRESS NOTE AT 10TH VISIT.  KX MODIFIER AFTER 15 VISITS.    PT Start Time  0945    PT Stop Time  1035    PT Time Calculation (min)  50 min    Equipment Utilized During Treatment  Other (comment)   FWW   Activity Tolerance  Patient tolerated treatment well    Behavior During Therapy  WFL for tasks assessed/performed       Past Medical History:  Diagnosis Date  . Arthritis   . 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 Gastric ulcer 2011   EGD, 5/11  . Hypothyroidism    not on meds, followed by Dr. Elyse Hsu  . Leukocytosis 11/20/2015  . Lipoma    left upper arm  . Pericardial effusion    a. HCAP complicated by pericardial effusion requiring pericardial window 10/2016 and large L pleural effusion requring VATS.  Marland Kitchen Pleural effusion    a. s/p VATS 2017.  Marland Kitchen Pneumonia 10/2016  . PONV (postoperative nausea and vomiting)   . Prolonged QT interval   . Urine incontinence   . UTI (lower urinary tract infection)    frequent    Past Surgical History:  Procedure Laterality Date  . BRAVO [place] STUDY  12/04/2012   Procedure: BRAVO [place];  Surgeon: [name], MD;  [facility];  Service: Endoscopy;  Laterality: N/A;  . CHOLECYSTECTOMY N/A 01/03/2014   Procedure: LAPAROSCOPIC CHOLECYSTECTOMY WITH INTRAOPERATIVE CHOLANGIOGRAM;  Surgeon: Joyice Faster. Cornett, MD;  Location: [facility];  Service: General;  Laterality: N/A;  . COLONOSCOPY  01/2004    DR [facility], few small tics  . ESOPHAGOGASTRODUODENOSCOPY  02/2010   gastric ulcers  . ESOPHAGOGASTRODUODENOSCOPY  12/04/2012   [id number] gastritis (inflammation) was found in the gastric antrum; multiple biopsies The duodenal mucosa showed no abnormalities in the bulb and second portion of the duodenum  . KYPHOPLASTY N/A 02/14/2019   Procedure: KYPHOPLASTY T12 and L1;  Surgeon: Melina Schools, MD;  Location: [facility];  Service: Orthopedics;  Laterality: N/A;  120 mins  . LIPOMA EXCISION  08/02/2011   left shoulder  . NOSE SURGERY    . PARTIAL HYSTERECTOMY     vaginal at age 4 years of age  . TOE DEBRIDEMENT Left 1962   lt great toe  . TOTAL KNEE ARTHROPLASTY Right 10/22/2019   Procedure: TOTAL KNEE ARTHROPLASTY;  Surgeon: Gaynelle Arabian, MD;  [facility];  Service: Orthopedics;  Laterality: Right;  72min  . VIDEO ASSISTED THORACOSCOPY (VATS)/EMPYEMA Left 10/25/2016   Procedure: VIDEO ASSISTED THORACOSCOPY (VATS), BRONCH,DRAINAGE OF PLEURAL EFFUSION,PERICARDIAL WINDOW WITH DRAINAGE OF PERICARDIAL FLUID, TEE;  Surgeon: [name], MD;  Location: [facility];  Service: Thoracic;  Laterality: Left;  Marland Kitchen VIDEO BRONCHOSCOPY N/A 10/25/2016   Procedure: VIDEO BRONCHOSCOPY;  Surgeon: [name], MD;  Location: [facility];  Service: Thoracic;  Laterality: N/A;    There were no vitals filed for this visit.  Subjective Assessment - 12/17/19 0949    Subjective  COVID-19 screen performed prior to patient entering clinic. Has been out due to leukemia problems but thinks that they have got her medications fixed.    Pertinent History  Leukemia, Kyphoplasty, OA.    How long can you stand comfortably?  Today, < two minutes.    How long can you walk comfortably?  See evaluation.    Patient Stated Goals  Get out of pain and walk.    Currently in Pain?  Yes    Pain Score  6     Pain Location  Knee    Pain Orientation  Right    Pain Descriptors / Indicators  Sore;Discomfort    Pain Type   Surgical pain    Pain Onset  More than a month ago    Pain Frequency  Constant         [facility] PT Assessment - 12/17/19 0001      Assessment   Medical Diagnosis  Right total knee replacement.    Referring Provider (PT)  Gaynelle Arabian MD.    Onset Date/Surgical Date  10/22/19    Next MD Visit  01/01/2020      Precautions   Precautions  Fall      Restrictions   Weight Bearing Restrictions  No                   [facility] Adult PT Treatment/Exercise - 12/17/19 0001      Knee/Hip Exercises: Aerobic   Nustep  L3, seat 9 x15 min      Knee/Hip Exercises: Standing   Forward Step Up  Right;10 reps;Step Height: 6";Hand Hold: 2      Knee/Hip Exercises: Seated   Long Arc Quad  Strengthening;Right;2 sets;10 reps;Weights    Long Arc Quad Weight  2 lbs.    Ball Squeeze  x20 reps 5 sec    Hamstring Curl  Strengthening;Right;2 sets;10 reps;Limitations    Hamstring Limitations  green theraband    Sit to Sand  15 reps;with UE support      Modalities   Modalities  Vasopneumatic      Vasopneumatic   Number Minutes Vasopneumatic   15 minutes    Vasopnuematic Location   Knee    Vasopneumatic Pressure  Medium    Vasopneumatic Temperature   34 for edema/pain               PT Short Term Goals - 10/29/19 1436      PT SHORT TERM GOAL #1   Title  Independent with an initial HEP.    Time  4    Period  Weeks    Status  On-going      PT SHORT TERM GOAL #2   Title  Full active right knee extension.    Time  4    Period  Weeks    Status  On-going      PT SHORT TERM GOAL #3   Title  Walk 200 feet in clinic with walker.    Time  4    Period  Weeks    Status  On-going        PT Long Term Goals - 10/29/19 1436      PT LONG TERM GOAL #1   Title  Right active knee flexion to 105-110 degrees.    Time  8    Period  Weeks    Status  On-going      PT LONG TERM  GOAL #2   Title  Perform 4 stairs with one railing.    Time  8    Period  Weeks    Status  On-going       PT LONG TERM GOAL #3   Title  Right hip and knee strength to 4+/5 to increase stability for functional activities.    Time  8    Period  Weeks    Status  On-going            Plan - 12/17/19 1048    Clinical Impression Statement  Patient presented in clinic after a hiatus on PT secondary to Leukemia complications. Patient utilizing [place] for ambulation at this time. Patient progressed with strengthening and functional exercises. Patient very anxious with any new exercises such as forward step ups and easy to fatigue. Patient completed limited HEP at home during hiatus. Normal vasopneumatic response noted following removal of the modality.    Personal Factors and Comorbidities  Comorbidity 1;Comorbidity 2    Comorbidities  Leukemia, Kyphoplasty, OA.    Examination-Activity Limitations  Bathing;Bed Mobility;Locomotion Level;Other;Transfers;Toileting;Stand;Stairs    Examination-Participation Restrictions  Cleaning;Community Activity;Other    Stability/Clinical Decision Making  Stable/Uncomplicated    Rehab Potential  Fair    PT Frequency  2x / week    PT Duration  8 weeks    PT Treatment/Interventions  ADLs/Self Care Home Management;Cryotherapy;Electrical Stimulation;Gait training;Stair training;Functional mobility training;Therapeutic activities;Therapeutic exercise;Neuromuscular re-education;Manual techniques;Patient/family education;Passive range of motion;Vasopneumatic Device    PT Next Visit Plan  Nustep, PROM, AAROM, vasopneumatic.    Consulted and Agree with Plan of Care  Patient       Patient will benefit from skilled therapeutic intervention in order to improve the following deficits and impairments:  Abnormal gait, Difficulty walking, Decreased activity tolerance, Decreased mobility, Decreased range of motion, Decreased strength, Increased edema, Pain  Visit Diagnosis: Chronic pain of right knee  Stiffness of right knee, not elsewhere classified  Localized edema  Muscle  weakness (generalized)     Problem List Patient Active Problem List   Diagnosis Date Noted  . Thoracic compression fracture ([place]) 02/14/2019  . Sweating abnormality 10/11/2017  . Iron deficiency anemia 02/16/2017  . Pleural effusion 10/23/2016  . Solitary pulmonary nodule 10/23/2016  . Anemia 11/21/2015  . Bladder prolapse, female, acquired 11/21/2015  . IBS (irritable bowel syndrome) 11/21/2015  . Neutrophilic leukocytosis 0000000  . OA (osteoarthritis) of knee 11/21/2015  . CML (chronic myeloid leukemia) ([place]) 11/20/2015  . Biliary dyskinesia 12/04/2013  . Dyspepsia 11/30/2012  . Rib pain 11/01/2011  . Colon cancer screening 09/21/2011  . Lipoma 07/27/2011  . Lipoma of arm 07/27/2011  . Mass of arm 07/08/2011  . PUD 05/20/2010  . ABDOMINAL PAIN RIGHT UPPER QUADRANT 02/12/2010  . Hypothyroidism 02/11/2010  . NAUSEA 02/11/2010  . UTI'S, HX OF 02/11/2010    Standley Brooking, PTA 12/17/2019, 11:01 AM  [facility] [place], [place], [address] Phone: [phone]   Fax:  [phone]  Name: Andrea Martinez MRN: [id number] Date of Birth: July 01, 1946

## 2019-12-19 ENCOUNTER — Ambulatory Visit: Payer: Medicare Other | Admitting: Physical Therapy

## 2019-12-19 ENCOUNTER — Encounter: Payer: Self-pay | Admitting: Physical Therapy

## 2019-12-19 ENCOUNTER — Other Ambulatory Visit: Payer: Self-pay

## 2019-12-19 DIAGNOSIS — M6281 Muscle weakness (generalized): Secondary | ICD-10-CM

## 2019-12-19 DIAGNOSIS — G8929 Other chronic pain: Secondary | ICD-10-CM

## 2019-12-19 DIAGNOSIS — M25661 Stiffness of right knee, not elsewhere classified: Secondary | ICD-10-CM

## 2019-12-19 DIAGNOSIS — M25561 Pain in right knee: Secondary | ICD-10-CM | POA: Diagnosis not present

## 2019-12-19 DIAGNOSIS — R600 Localized edema: Secondary | ICD-10-CM

## 2019-12-19 NOTE — Therapy (Signed)
[facility] [place], [place], [address] Phone: [phone]   Fax:  [phone]  Physical Therapy Treatment  Patient Details  Name: Andrea Martinez MRN: [id number] Date of Birth: [birth year] Referring Provider (PT): Gaynelle Arabian MD.   Encounter Date: 12/19/2019  PT End of Session - 12/19/19 0951    Visit Number  10    Number of Visits  16    Date for PT Re-Evaluation  01/24/20    Authorization Type  FOTO AT LEAST EVERY 5TH VISIT.  PROGRESS NOTE AT 10TH VISIT.  KX MODIFIER AFTER 15 VISITS.    PT Start Time  [phone]    PT Stop Time  1040    PT Time Calculation (min)  53 min    Equipment Utilized During Treatment  Other (comment)   FWW   Activity Tolerance  Patient tolerated treatment well    Behavior During Therapy  WFL for tasks assessed/performed       Past Medical History:  Diagnosis Date  . Arthritis   . Chronic myelogenous leukemia (CML), BCR-ABL1-positive ([place]) 11/20/2015  . Gastric ulcer 2011   EGD, 5/11  . Hypothyroidism    not on meds, followed by Dr. Elyse Hsu  . Leukocytosis 11/20/2015  . Lipoma    left upper arm  . Pericardial effusion    a. HCAP complicated by pericardial effusion requiring pericardial window 10/2016 and large L pleural effusion requring VATS.  Marland Kitchen Pleural effusion    a. s/p VATS 2017.  Marland Kitchen Pneumonia 10/2016  . PONV (postoperative nausea and vomiting)   . Prolonged QT interval   . Urine incontinence   . UTI (lower urinary tract infection)    frequent    Past Surgical History:  Procedure Laterality Date  . BRAVO [place] STUDY  12/04/2012   Procedure: BRAVO [place];  Surgeon: [name], MD;  [facility];  Service: Endoscopy;  Laterality: N/A;  . CHOLECYSTECTOMY N/A 01/03/2014   Procedure: LAPAROSCOPIC CHOLECYSTECTOMY WITH INTRAOPERATIVE CHOLANGIOGRAM;  Surgeon: Joyice Faster. Cornett, MD;  Location: [facility];  Service: General;  Laterality: N/A;  . COLONOSCOPY   01/2004   DR [facility], few small tics  . ESOPHAGOGASTRODUODENOSCOPY  02/2010   gastric ulcers  . ESOPHAGOGASTRODUODENOSCOPY  12/04/2012   [id number] gastritis (inflammation) was found in the gastric antrum; multiple biopsies The duodenal mucosa showed no abnormalities in the bulb and second portion of the duodenum  . KYPHOPLASTY N/A 02/14/2019   Procedure: KYPHOPLASTY T12 and L1;  Surgeon: Melina Schools, MD;  Location: [facility];  Service: Orthopedics;  Laterality: N/A;  120 mins  . LIPOMA EXCISION  08/02/2011   left shoulder  . NOSE SURGERY    . PARTIAL HYSTERECTOMY     vaginal at age 74 years of age  . TOE DEBRIDEMENT Left 1962   lt great toe  . TOTAL KNEE ARTHROPLASTY Right 10/22/2019   Procedure: TOTAL KNEE ARTHROPLASTY;  Surgeon: Gaynelle Arabian, MD;  [facility];  Service: Orthopedics;  Laterality: Right;  24min  . VIDEO ASSISTED THORACOSCOPY (VATS)/EMPYEMA Left 10/25/2016   Procedure: VIDEO ASSISTED THORACOSCOPY (VATS), BRONCH,DRAINAGE OF PLEURAL EFFUSION,PERICARDIAL WINDOW WITH DRAINAGE OF PERICARDIAL FLUID, TEE;  Surgeon: [name], MD;  Location: [facility];  Service: Thoracic;  Laterality: Left;  Marland Kitchen VIDEO BRONCHOSCOPY N/A 10/25/2016   Procedure: VIDEO BRONCHOSCOPY;  Surgeon: [name], MD;  Location: [facility];  Service: Thoracic;  Laterality: N/A;    There were no vitals filed for this visit.  Subjective Assessment - 12/19/19 0948    Subjective  COVID 19 screening performed on patient upon arrival. Patient reports soremess after last treatment in B knees. Reports that she is going to try to find a hurrycane.    Pertinent History  Leukemia, Kyphoplasty, OA.    How long can you stand comfortably?  Today, < two minutes.    How long can you walk comfortably?  See evaluation.    Patient Stated Goals  Get out of pain and walk.    Currently in Pain?  Yes    Pain Score  5     Pain Location  Knee    Pain Orientation  Right;Left    Pain Descriptors / Indicators  Sore     Pain Type  Surgical pain    Pain Onset  More than a month ago    Pain Frequency  Constant         [facility] PT Assessment - 12/19/19 0001      Assessment   Medical Diagnosis  Right total knee replacement.    Referring Provider (PT)  Gaynelle Arabian MD.    Onset Date/Surgical Date  10/22/19    Next MD Visit  01/01/2020      Precautions   Precautions  Fall      Restrictions   Weight Bearing Restrictions  No      Observation/Other Assessments   Focus on Therapeutic Outcomes (FOTO)   44%, CK      ROM / Strength   AROM / PROM / Strength  AROM      AROM   Overall AROM   Within functional limits for tasks performed    AROM Assessment Site  Knee    Right/Left Knee  Right    Right Knee Extension  2    Right Knee Flexion  105                   [facility] Adult PT Treatment/Exercise - 12/19/19 0001      Knee/Hip Exercises: Aerobic   Nustep  L5, seat 8 x15 min      Knee/Hip Exercises: Standing   Hip Flexion  AROM;Both;2 sets;10 reps;Knee bent    Hip Abduction  AROM;Both;2 sets;10 reps;Knee straight    Functional Squat  10 reps;3 seconds      Knee/Hip Exercises: Seated   Long Arc Quad  Strengthening;Right;2 sets;10 reps;Weights    Long Arc Quad Weight  3 lbs.    Clamshell with TheraBand  Green   2x10 reps   Hamstring Curl  Strengthening;Right;2 sets;10 reps;Limitations    Hamstring Limitations  green theraband      Modalities   Modalities  Vasopneumatic      Vasopneumatic   Number Minutes Vasopneumatic   15 minutes    Vasopnuematic Location   Knee    Vasopneumatic Pressure  Medium    Vasopneumatic Temperature   34 for edema/pain               PT Short Term Goals - 12/19/19 1023      PT SHORT TERM GOAL #1   Title  Independent with an initial HEP.    Time  4    Period  Weeks    Status  Achieved      PT SHORT TERM GOAL #2   Title  Full active right knee extension.    Time  4    Period  Weeks    Status  On-going      PT  SHORT TERM GOAL #3   Title   Walk 200 feet in clinic with walker.    Time  4    Period  Weeks    Status  Achieved        PT Long Term Goals - 12/19/19 1024      PT LONG TERM GOAL #1   Title  Right active knee flexion to 105-110 degrees.    Time  8    Period  Weeks    Status  Achieved      PT LONG TERM GOAL #2   Title  Perform 4 stairs with one railing.    Time  8    Period  Weeks    Status  On-going      PT LONG TERM GOAL #3   Title  Right hip and knee strength to 4+/5 to increase stability for functional activities.    Time  8    Period  Weeks    Status  On-going            Plan - 12/19/19 1041    Clinical Impression Statement  Patient presented in clinic with B knee soreness and reports of L hip/buttock pain. Patient progressed with more functional strengthening with only reports of fatigue. AROM of  R knee measured as 2-105 deg in sitting per patient comfort. Patient instructed in proper gait sequence with [facility] as patient interested in purchasing cane for herself. Patient observed ambulating only 10 ft but felt comfortable with gait sequence and SBQC. Normal vasopneumatic response noted following removal of the modality.    Personal Factors and Comorbidities  Comorbidity 1;Comorbidity 2    Comorbidities  Leukemia, Kyphoplasty, OA.    Examination-Activity Limitations  Bathing;Bed Mobility;Locomotion Level;Other;Transfers;Toileting;Stand;Stairs    Examination-Participation Restrictions  Cleaning;Community Activity;Other    Stability/Clinical Decision Making  Stable/Uncomplicated    Rehab Potential  Fair    PT Frequency  2x / week    PT Treatment/Interventions  ADLs/Self Care Home Management;Cryotherapy;Electrical Stimulation;Gait training;Stair training;Functional mobility training;Therapeutic activities;Therapeutic exercise;Neuromuscular re-education;Manual techniques;Patient/family education;Passive range of motion;Vasopneumatic Device    PT Next Visit Plan  Nustep, PROM, AAROM, vasopneumatic.     Consulted and Agree with Plan of Care  Patient       Patient will benefit from skilled therapeutic intervention in order to improve the following deficits and impairments:  Abnormal gait, Difficulty walking, Decreased activity tolerance, Decreased mobility, Decreased range of motion, Decreased strength, Increased edema, Pain  Visit Diagnosis: Chronic pain of right knee  Stiffness of right knee, not elsewhere classified  Localized edema  Muscle weakness (generalized)     Problem List Patient Active Problem List   Diagnosis Date Noted  . Thoracic compression fracture ([place]) 02/14/2019  . Sweating abnormality 10/11/2017  . Iron deficiency anemia 02/16/2017  . Pleural effusion 10/23/2016  . Solitary pulmonary nodule 10/23/2016  . Anemia 11/21/2015  . Bladder prolapse, female, acquired 11/21/2015  . IBS (irritable bowel syndrome) 11/21/2015  . Neutrophilic leukocytosis 0000000  . OA (osteoarthritis) of knee 11/21/2015  . CML (chronic myeloid leukemia) ([place]) 11/20/2015  . Biliary dyskinesia 12/04/2013  . Dyspepsia 11/30/2012  . Rib pain 11/01/2011  . Colon cancer screening 09/21/2011  . Lipoma 07/27/2011  . Lipoma of arm 07/27/2011  . Mass of arm 07/08/2011  . PUD 05/20/2010  . ABDOMINAL PAIN RIGHT UPPER QUADRANT 02/12/2010  . Hypothyroidism 02/11/2010  . NAUSEA 02/11/2010  . UTI'S, HX OF 02/11/2010    Standley Brooking, PTA 12/19/2019, 10:48 AM  [facility] [place], [place], [address] Phone: [phone]   Fax:  [phone]  Name: MAIVEN MONTJOY MRN: [id number] Date of Birth: [birth year]  Progress Note Reporting Period 10/26/19 to 12/19/19  See note below for Objective Data and Assessment of Progress/Goals.  Patient has made very good progress since beginning PT with right knee flexion to 105 degrees.    Mali Applegate MPT

## 2019-12-21 NOTE — Telephone Encounter (Signed)
Oral Oncology Patient Advocate Encounter  Received notification from [facility] that patient has been successfully re-enrolled into their program to receive Tasigna from the manufacturer at $0 out of pocket until 12/12/2020.    I called and spoke with patient. She knows we will have to re-apply.   Patient knows to call the office with questions or concerns.   Oral Oncology Clinic will continue to follow.  [place] Patient [place] Phone [phone] Fax [phone] 12/21/2019 2:58 PM

## 2019-12-24 ENCOUNTER — Ambulatory Visit: Payer: Medicare Other | Admitting: Physical Therapy

## 2019-12-26 ENCOUNTER — Ambulatory Visit: Payer: Medicare Other | Admitting: Physical Therapy

## 2019-12-26 ENCOUNTER — Other Ambulatory Visit: Payer: Self-pay

## 2019-12-26 ENCOUNTER — Encounter: Payer: Self-pay | Admitting: Physical Therapy

## 2019-12-26 DIAGNOSIS — M6281 Muscle weakness (generalized): Secondary | ICD-10-CM

## 2019-12-26 DIAGNOSIS — G8929 Other chronic pain: Secondary | ICD-10-CM

## 2019-12-26 DIAGNOSIS — M25661 Stiffness of right knee, not elsewhere classified: Secondary | ICD-10-CM

## 2019-12-26 DIAGNOSIS — M25561 Pain in right knee: Secondary | ICD-10-CM | POA: Diagnosis not present

## 2019-12-26 DIAGNOSIS — R600 Localized edema: Secondary | ICD-10-CM

## 2019-12-26 NOTE — Therapy (Signed)
[facility] [place], [place], [address] Phone: [phone]   Fax:  [phone]  Physical Therapy Treatment  Patient Details  Name: Andrea Martinez MRN: [id number] Date of Birth: [birth year] Referring Provider (PT): Gaynelle Arabian MD.   Encounter Date: 12/26/2019  PT End of Session - 12/26/19 0958    Visit Number  11    Number of Visits  16    Date for PT Re-Evaluation  01/24/20    Authorization Type  FOTO AT LEAST EVERY 5TH VISIT.  PROGRESS NOTE AT 10TH VISIT.  KX MODIFIER AFTER 15 VISITS.    PT Start Time  [phone]    PT Stop Time  1038    PT Time Calculation (min)  51 min    Equipment Utilized During Treatment  Other (comment)   FWW   Activity Tolerance  Patient tolerated treatment well    Behavior During Therapy  WFL for tasks assessed/performed       Past Medical History:  Diagnosis Date  . Arthritis   . Chronic myelogenous leukemia (CML), BCR-ABL1-positive ([place]) 11/20/2015  . Gastric ulcer 2011   EGD, 5/11  . Hypothyroidism    not on meds, followed by Dr. Elyse Hsu  . Leukocytosis 11/20/2015  . Lipoma    left upper arm  . Pericardial effusion    a. HCAP complicated by pericardial effusion requiring pericardial window 10/2016 and large L pleural effusion requring VATS.  Marland Kitchen Pleural effusion    a. s/p VATS 2017.  Marland Kitchen Pneumonia 10/2016  . PONV (postoperative nausea and vomiting)   . Prolonged QT interval   . Urine incontinence   . UTI (lower urinary tract infection)    frequent    Past Surgical History:  Procedure Laterality Date  . BRAVO [place] STUDY  12/04/2012   Procedure: BRAVO [place];  Surgeon: [name], MD;  [facility];  Service: Endoscopy;  Laterality: N/A;  . CHOLECYSTECTOMY N/A 01/03/2014   Procedure: LAPAROSCOPIC CHOLECYSTECTOMY WITH INTRAOPERATIVE CHOLANGIOGRAM;  Surgeon: Joyice Faster. Cornett, MD;  Location: [facility];  Service: General;  Laterality: N/A;  . COLONOSCOPY   01/2004   [facility], few small tics  . ESOPHAGOGASTRODUODENOSCOPY  02/2010   gastric ulcers  . ESOPHAGOGASTRODUODENOSCOPY  12/04/2012   [id number] gastritis (inflammation) was found in the gastric antrum; multiple biopsies The duodenal mucosa showed no abnormalities in the bulb and second portion of the duodenum  . KYPHOPLASTY N/A 02/14/2019   Procedure: KYPHOPLASTY T12 and L1;  Surgeon: Melina Schools, MD;  Location: [facility];  Service: Orthopedics;  Laterality: N/A;  120 mins  . LIPOMA EXCISION  08/02/2011   left shoulder  . NOSE SURGERY    . PARTIAL HYSTERECTOMY     vaginal at age 74 years of age  . TOE DEBRIDEMENT Left 1962   lt great toe  . TOTAL KNEE ARTHROPLASTY Right 10/22/2019   Procedure: TOTAL KNEE ARTHROPLASTY;  Surgeon: Gaynelle Arabian, MD;  [facility];  Service: Orthopedics;  Laterality: Right;  21min  . VIDEO ASSISTED THORACOSCOPY (VATS)/EMPYEMA Left 10/25/2016   Procedure: VIDEO ASSISTED THORACOSCOPY (VATS), BRONCH,DRAINAGE OF PLEURAL EFFUSION,PERICARDIAL WINDOW WITH DRAINAGE OF PERICARDIAL FLUID, TEE;  Surgeon: [name], MD;  Location: [facility];  Service: Thoracic;  Laterality: Left;  Marland Kitchen VIDEO BRONCHOSCOPY N/A 10/25/2016   Procedure: VIDEO BRONCHOSCOPY;  Surgeon: [name], MD;  Location: [place] [facility];  Service: Thoracic;  Laterality: N/A;    There were no vitals filed for this visit.  Subjective Assessment - 12/26/19 0949    Subjective  COVID 19 screening performed on patient upon arrival. Patient reports depending on L knee a lot more and needing multiple surgeries.    Pertinent History  Leukemia, Kyphoplasty, OA.    How long can you stand comfortably?  Today, < two minutes.    How long can you walk comfortably?  See evaluation.    Patient Stated Goals  Get out of pain and walk.    Currently in Pain?  Yes    Pain Score  4     Pain Location  Knee    Pain Orientation  Left    Pain Descriptors / Indicators  Discomfort    Pain Type  Surgical pain    Pain  Onset  More than a month ago    Pain Frequency  Constant         [facility] PT Assessment - 12/26/19 0001      Assessment   Medical Diagnosis  Right total knee replacement.    Referring Provider (PT)  Gaynelle Arabian MD.    Onset Date/Surgical Date  10/22/19    Next MD Visit  01/01/2020      Precautions   Precautions  Fall      Restrictions   Weight Bearing Restrictions  No      ROM / Strength   AROM / PROM / Strength  AROM      AROM   Overall AROM   Within functional limits for tasks performed    AROM Assessment Site  Knee    Right/Left Knee  Right    Right Knee Extension  2                   [facility] Adult PT Treatment/Exercise - 12/26/19 0001      Knee/Hip Exercises: Aerobic   Nustep  L5, seat 7 x15 min      Knee/Hip Exercises: Standing   Hip Flexion  AROM;Right;10 reps;Knee bent    Hip Abduction  AROM;Right;15 reps;Knee straight    Rocker Board  3 minutes      Knee/Hip Exercises: Seated   Long Arc Quad  Strengthening;Right;2 sets;10 reps;Weights    Long Arc Quad Weight  4 lbs.    Sit to Sand  10 reps;without UE support      Modalities   Modalities  Vasopneumatic      Vasopneumatic   Number Minutes Vasopneumatic   15 minutes    Vasopnuematic Location   Knee    Vasopneumatic Pressure  Medium    Vasopneumatic Temperature   34 for edema/pain               PT Short Term Goals - 12/19/19 1023      PT SHORT TERM GOAL #1   Title  Independent with an initial HEP.    Time  4    Period  Weeks    Status  Achieved      PT SHORT TERM GOAL #2   Title  Full active right knee extension.    Time  4    Period  Weeks    Status  On-going      PT SHORT TERM GOAL #3   Title  Walk 200 feet in clinic with walker.    Time  4    Period  Weeks    Status  Achieved        PT Long Term Goals - 12/19/19 1024      PT  LONG TERM GOAL #1   Title  Right active knee flexion to 105-110 degrees.    Time  8    Period  Weeks    Status  Achieved      PT LONG  TERM GOAL #2   Title  Perform 4 stairs with one railing.    Time  8    Period  Weeks    Status  On-going      PT LONG TERM GOAL #3   Title  Right hip and knee strength to 4+/5 to increase stability for functional activities.    Time  8    Period  Weeks    Status  On-going            Plan - 12/26/19 1048    Clinical Impression Statement  Patient presented in clinic with mid level R knee soreness. Patient limited with therex per perspiration that is caused from chemo medication per patient report and corresponding weakness. Patient able to now lay supine with comfort for approximately 15 minutes per patient report. Normal vasopneuamtic response noted following removal of the modalities.    Personal Factors and Comorbidities  Comorbidity 1;Comorbidity 2    Comorbidities  Leukemia, Kyphoplasty, OA.    Examination-Activity Limitations  Bathing;Bed Mobility;Locomotion Level;Other;Transfers;Toileting;Stand;Stairs    Examination-Participation Restrictions  Cleaning;Community Activity;Other    Stability/Clinical Decision Making  Stable/Uncomplicated    Rehab Potential  Fair    PT Frequency  2x / week    PT Duration  8 weeks    PT Treatment/Interventions  ADLs/Self Care Home Management;Cryotherapy;Electrical Stimulation;Gait training;Stair training;Functional mobility training;Therapeutic activities;Therapeutic exercise;Neuromuscular re-education;Manual techniques;Patient/family education;Passive range of motion;Vasopneumatic Device    PT Next Visit Plan  Nustep, PROM, AAROM, vasopneumatic.    Consulted and Agree with Plan of Care  Patient       Patient will benefit from skilled therapeutic intervention in order to improve the following deficits and impairments:  Abnormal gait, Difficulty walking, Decreased activity tolerance, Decreased mobility, Decreased range of motion, Decreased strength, Increased edema, Pain  Visit Diagnosis: Chronic pain of right knee  Stiffness of right knee,  not elsewhere classified  Localized edema  Muscle weakness (generalized)     Problem List Patient Active Problem List   Diagnosis Date Noted  . Thoracic compression fracture ([place]) 02/14/2019  . Sweating abnormality 10/11/2017  . Iron deficiency anemia 02/16/2017  . Pleural effusion 10/23/2016  . Solitary pulmonary nodule 10/23/2016  . Anemia 11/21/2015  . Bladder prolapse, female, acquired 11/21/2015  . IBS (irritable bowel syndrome) 11/21/2015  . Neutrophilic leukocytosis 0000000  . OA (osteoarthritis) of knee 11/21/2015  . CML (chronic myeloid leukemia) ([place]) 11/20/2015  . Biliary dyskinesia 12/04/2013  . Dyspepsia 11/30/2012  . Rib pain 11/01/2011  . Colon cancer screening 09/21/2011  . Lipoma 07/27/2011  . Lipoma of arm 07/27/2011  . Mass of arm 07/08/2011  . PUD 05/20/2010  . ABDOMINAL PAIN RIGHT UPPER QUADRANT 02/12/2010  . Hypothyroidism 02/11/2010  . NAUSEA 02/11/2010  . UTI'S, HX OF 02/11/2010    Standley Brooking, PTA 12/26/2019, 10:52 AM  [facility] [address] [place], [place], [address] Phone: [phone]   Fax:  [phone]  Name: Andrea Martinez MRN: [id number] Date of Birth: [birth year]

## 2019-12-31 ENCOUNTER — Ambulatory Visit: Payer: Medicare Other | Admitting: Physical Therapy

## 2020-01-02 ENCOUNTER — Encounter: Payer: Medicare Other | Admitting: Physical Therapy

## 2020-01-03 ENCOUNTER — Other Ambulatory Visit: Payer: Self-pay | Admitting: "Endocrinology

## 2020-02-04 ENCOUNTER — Other Ambulatory Visit: Payer: Self-pay

## 2020-02-04 ENCOUNTER — Ambulatory Visit (INDEPENDENT_AMBULATORY_CARE_PROVIDER_SITE_OTHER): Payer: Medicare Other | Admitting: Family Medicine

## 2020-02-04 ENCOUNTER — Encounter: Payer: Self-pay | Admitting: Family Medicine

## 2020-02-04 VITALS — BP 115/70 | HR 78 | Temp 98.2°F | Ht 65.0 in | Wt 219.4 lb

## 2020-02-04 DIAGNOSIS — N8110 Cystocele, unspecified: Secondary | ICD-10-CM

## 2020-02-04 DIAGNOSIS — M1710 Unilateral primary osteoarthritis, unspecified knee: Secondary | ICD-10-CM

## 2020-02-04 DIAGNOSIS — C9210 Chronic myeloid leukemia, BCR/ABL-positive, not having achieved remission: Secondary | ICD-10-CM

## 2020-02-04 DIAGNOSIS — E039 Hypothyroidism, unspecified: Secondary | ICD-10-CM | POA: Diagnosis not present

## 2020-02-04 DIAGNOSIS — M179 Osteoarthritis of knee, unspecified: Secondary | ICD-10-CM

## 2020-02-04 NOTE — Patient Instructions (Addendum)
Call for mammogram at your convenience  Keep appointments with oncology and endocrinology

## 2020-02-04 NOTE — Progress Notes (Signed)
New Patient Office Visit  Subjective:  Patient ID: Andrea Martinez, female    DOB: 08-23-1946  Age: 74 y.o. MRN: [id number]  CC:  Chief Complaint  Patient presents with  . Establish Care  oncology CML (chronic myeloid leukemia) ([place])   CML in chronic phase: stabilized years -Nilotinib started on 01/02/2016. -She developed left pleural and pericardial effusion, requiring pericardial window on 10/25/2016, fluid cytology negative. -She was started back on Nilotinib 300 mg every 12 hours on 12/06/2016. -She is tolerating Nilotinib very well. -We reviewed labs from 11/22/2019.  White count is elevated at 14 with elevated neutrophil count. -However BCR/ABL by quantitative PCR was undetectable. -Hence we will continue same dose of Nilotinib at this time.  I plan to see her back in 3 months with repeat labs 1 week prior. pt taking Lexapro 20mg  1.5 tablets daily HPI Andrea Martinez presents for right knee replacement-11/20-using walker/cane-Ortho-needs left knee replacement  Hypothyroid-takes synthroid-TSH 1.84-8/20-Dr. Dorris Fetch Incontinence-[facility]-needs bladder surgery AR-zyrtec daily DEXA-5/20 [facility] Spine L2-L4 05/08/2019 74.3 Normal 0.0 1.204 g/cm2 - - DualFemur Neck Left 05/08/2019 74.3 Normal -1.0 0.901 g/cm2 - -     Past Medical History:  Diagnosis Date  . Arthritis   . Chronic myelogenous leukemia (CML), BCR-ABL1-positive ([place]) 11/20/2015  . Gastric ulcer 2011   EGD, 5/11  . Hypothyroidism    not on meds, followed by Dr. Elyse Hsu  . Leukocytosis 11/20/2015  . Lipoma    left upper arm  . Pericardial effusion    a. HCAP complicated by pericardial effusion requiring pericardial window 10/2016 and large L pleural effusion requring VATS.  Marland Kitchen Pleural effusion    a. s/p VATS 2017.  Marland Kitchen Pneumonia 10/2016  . PONV (postoperative nausea and vomiting)   . Prolonged QT interval   . Urine incontinence   . UTI (lower urinary tract infection)    frequent    Past Surgical History:   Procedure Laterality Date  . BRAVO [place] STUDY  12/04/2012   Procedure: BRAVO [place];  Surgeon: [name], MD;  [facility];  Service: Endoscopy;  Laterality: N/A;  . CHOLECYSTECTOMY N/A 01/03/2014   Procedure: LAPAROSCOPIC CHOLECYSTECTOMY WITH INTRAOPERATIVE CHOLANGIOGRAM;  Surgeon: Joyice Faster. Cornett, MD;  Location: [facility];  Service: General;  Laterality: N/A;  . COLONOSCOPY  01/2004   DR [facility], few small tics  . ESOPHAGOGASTRODUODENOSCOPY  02/2010   gastric ulcers  . ESOPHAGOGASTRODUODENOSCOPY  12/04/2012   [id number] gastritis (inflammation) was found in the gastric antrum; multiple biopsies The duodenal mucosa showed no abnormalities in the bulb and second portion of the duodenum  . JOINT REPLACEMENT Right 10/22/2019  . KYPHOPLASTY N/A 02/14/2019   Procedure: KYPHOPLASTY T12 and L1;  Surgeon: Melina Schools, MD;  Location: [facility];  Service: Orthopedics;  Laterality: N/A;  120 mins  . LIPOMA EXCISION  08/02/2011   left shoulder  . NOSE SURGERY    . PARTIAL HYSTERECTOMY     vaginal at age 40 years of age  . TOE DEBRIDEMENT Left 1962   lt great toe  . TOTAL KNEE ARTHROPLASTY Right 10/22/2019   Procedure: TOTAL KNEE ARTHROPLASTY;  Surgeon: Gaynelle Arabian, MD;  [facility];  Service: Orthopedics;  Laterality: Right;  58min  . VIDEO ASSISTED THORACOSCOPY (VATS)/EMPYEMA Left 10/25/2016   Procedure: VIDEO ASSISTED THORACOSCOPY (VATS), BRONCH,DRAINAGE OF PLEURAL EFFUSION,PERICARDIAL WINDOW WITH DRAINAGE OF PERICARDIAL FLUID, TEE;  Surgeon: [name], MD;  Location: [place];  Service: Thoracic;  Laterality: Left;  Marland Kitchen VIDEO  BRONCHOSCOPY N/A 10/25/2016   Procedure: VIDEO BRONCHOSCOPY;  Surgeon: [name], MD;  Location[facility];  Service: Thoracic;  Laterality: N/A;    Family History  Problem Relation Age of Onset  . Lung cancer Father   . COPD Mother   . Colon cancer Neg Hx   . Anesthesia problems Neg Hx   . Hypotension Neg Hx   .  Malignant hyperthermia Neg Hx   . Pseudochol deficiency Neg Hx     Social History   Socioeconomic History  . Marital status: Married    Spouse name: Not on file  . Number of children: 2  . Years of education: 12th grade  . Highest education level: Not on file  Occupational History  . Occupation: retired    Fish farm manager: UENPLOYED    Employer: RETIRED  Tobacco Use  . Smoking status: Never Smoker  . Smokeless tobacco: Never Used  Substance and Sexual Activity  . Alcohol use: No    Alcohol/week: 0.0 standard drinks  . Drug use: No  . Sexual activity: Yes    Partners: Male    Birth control/protection: None  Other Topics Concern  . Not on file  Social History Narrative  . Not on file   Social Determinants of Health   Financial Resource Strain:   . Difficulty of Paying Living Expenses: Not on file  Food Insecurity:   . Worried About Charity fundraiser in the Last Year: Not on file  . Ran Out of Food in the Last Year: Not on file  Transportation Needs:   . Lack of Transportation (Medical): Not on file  . Lack of Transportation (Non-Medical): Not on file  Physical Activity:   . Days of Exercise per Week: Not on file  . Minutes of Exercise per Session: Not on file  Stress:   . Feeling of Stress : Not on file  Social Connections:   . Frequency of Communication with Friends and Family: Not on file  . Frequency of Social Gatherings with Friends and Family: Not on file  . Attends Religious Services: Not on file  . Active Member of Clubs or Organizations: Not on file  . Attends Archivist Meetings: Not on file  . Marital Status: Not on file  Intimate Partner Violence:   . Fear of Current or Ex-Partner: Not on file  . Emotionally Abused: Not on file  . Physically Abused: Not on file  . Sexually Abused: Not on file    ROS Review of Systems  Constitutional: Negative.   HENT: Negative.        No hearing aids  Eyes:       Glasses-reading  Respiratory: Negative.    Cardiovascular:       Pt with fluid in heart and lungs-cardiothoracic surgery  Gastrointestinal: Negative.        Gallstones Ulcer disease  Genitourinary:       Incontinence of urine-bladder has dropped  Musculoskeletal: Positive for arthralgias.       Knee pain-replacement 11/20  Allergic/Immunologic: Positive for environmental allergies.  Neurological: Negative.   Hematological: Bruises/bleeds easily.       CML  Psychiatric/Behavioral: The patient is nervous/anxious.     Objective:   Today's Vitals: BP 115/70 (BP Location: Right Arm, Patient Position: Sitting)   Pulse 78   Temp 98.2 F (36.8 C) (Temporal)   Ht 5\' 5"  (1.651 m)   Wt 219 lb 6.4 oz (99.5 kg)   SpO2 96%   BMI 36.51  kg/m   Physical Exam Constitutional:      Appearance: Normal appearance.  HENT:     Head: Normocephalic and atraumatic.  Cardiovascular:     Rate and Rhythm: Normal rate and regular rhythm.     Pulses: Normal pulses.     Heart sounds: Normal heart sounds.  Pulmonary:     Effort: Pulmonary effort is normal.     Breath sounds: Normal breath sounds.  Musculoskeletal:        General: Swelling and tenderness present.     Cervical back: Normal range of motion and neck supple.     Right lower leg: Edema present.     Left lower leg: Edema present.  Neurological:     Mental Status: She is alert and oriented to person, place, and time.  Psychiatric:        Mood and Affect: Mood normal.        Behavior: Behavior normal.     Assessment & Plan:   1. CML (chronic myeloid leukemia) ([place]) Oncology following 2. Hypothyroidism, unspecified type Synthroid-endo following 3. Bladder prolapse, female, acquired Pt currently does not want surgery 4. Osteoarthritis of knee, unspecified laterality, unspecified osteoarthritis type Pt needs knee replacement on the left -using cane/walker. After right knee replacement pt states pain in the left knee due to the increase pressure   Outpatient Encounter  Medications as of 02/04/2020  Medication Sig  . acetaminophen (TYLENOL) 500 MG tablet Take 500 mg by mouth every 6 (six) hours as needed.  . cetirizine (ZYRTEC) 10 MG tablet Take 10 mg by mouth daily.   . Cholecalciferol (VITAMIN D3) 50 MCG (2000 UT) TABS Take 1 tablet by mouth daily.  Marland Kitchen escitalopram (LEXAPRO) 20 MG tablet Take 1.5 tablets (30 mg total) by mouth daily.  . Lactobacillus-Inulin (PROBIOTIC DIGESTIVE SUPPORT PO) Take 2 tablets by mouth daily.  Marland Kitchen lactose free nutrition (BOOST) LIQD Take 237 mLs by mouth daily.  Marland Kitchen levothyroxine (SYNTHROID) 125 MCG tablet TAKE 1 TABLET (125 MCG TOTAL) BY MOUTH DAILY BEFORE BREAKFAST.  . Multiple Vitamins-Minerals (CENTRUM SILVER 50+WOMEN) TABS Take 1 tablet by mouth daily.  . nilotinib (TASIGNA) 150 MG capsule Take 2 capsules (300 mg total) by mouth every 12 (twelve) hours. (Patient taking differently: Take 300 mg by mouth every 12 (twelve) hours. )  . vitamin B-12 (CYANOCOBALAMIN) 1000 MCG tablet Take 1,000 mcg by mouth daily.  . Vitamin E 180 MG CAPS Take 2 capsules by mouth daily.  . rivaroxaban (XARELTO) 10 MG TABS tablet Take 1 tablet (10 mg total) by mouth daily with breakfast for 20 days. Take one tablet Xarelto once a day for three weeks following surgery. Then change to one baby Aspirin (81 mg) once a day for three weeks. Then discontinue Aspirin.  . [DISCONTINUED] gabapentin (NEURONTIN) 100 MG capsule Take 2 capsules (200 mg total) by mouth 3 (three) times daily. Take two 100 mg capsules three times a day for two weeks following surgery.Then take two 100 mg capsules two times a day for two weeks. Then take two 100 mg capsules once a day for two weeks.Then discontinue the Gabapentin.   No facility-administered encounter medications on file as of 02/04/2020.   45 minutes in review of old records and personal history, physical , assessment and plan Follow-up: as needed-keep appointments with oncology/endocrinology/urology  Emilian Stawicki Hannah Beat, MD

## 2020-02-11 ENCOUNTER — Other Ambulatory Visit (HOSPITAL_COMMUNITY): Payer: Self-pay | Admitting: Orthopedic Surgery

## 2020-02-13 ENCOUNTER — Ambulatory Visit: Payer: Medicare Other | Admitting: "Endocrinology

## 2020-02-20 NOTE — H&P (Signed)
TOTAL KNEE ADMISSION H&P  Patient is being admitted for left total knee arthroplasty.  Subjective:  Chief Complaint:left knee pain.  HPI: Andrea Martinez, 74 y.o. female, has a history of pain and functional disability in the left knee due to arthritis and has failed non-surgical conservative treatments for greater than 12 weeks to includeNSAID's and/or analgesics, corticosteriod injections, viscosupplementation injections, flexibility and strengthening excercises, use of assistive devices, weight reduction as appropriate and activity modification.  Onset of symptoms was gradual, starting 5 years ago with gradually worsening course since that time. The patient noted no past surgery on the left knee(s).  Patient currently rates pain in the left knee(s) at 7 out of 10 with activity. Patient has night pain, worsening of pain with activity and weight bearing, pain that interferes with activities of daily living, pain with passive range of motion, crepitus and joint swelling.  Patient has evidence of periarticular osteophytes, joint subluxation and joint space narrowing by imaging studies. There is no active infection.  Patient Active Problem List   Diagnosis Date Noted  . Thoracic compression fracture ([place]) 02/14/2019  . Sweating abnormality 10/11/2017  . Iron deficiency anemia 02/16/2017  . Pleural effusion 10/23/2016  . Solitary pulmonary nodule 10/23/2016  . Anemia 11/21/2015  . Bladder prolapse, female, acquired 11/21/2015  . IBS (irritable bowel syndrome) 11/21/2015  . Neutrophilic leukocytosis 0000000  . OA (osteoarthritis) of knee 11/21/2015  . CML (chronic myeloid leukemia) ([place]) 11/20/2015  . Biliary dyskinesia 12/04/2013  . Dyspepsia 11/30/2012  . Rib pain 11/01/2011  . Colon cancer screening 09/21/2011  . Lipoma 07/27/2011  . Lipoma of arm 07/27/2011  . Mass of arm 07/08/2011  . PUD 05/20/2010  . ABDOMINAL PAIN RIGHT UPPER QUADRANT 02/12/2010  . Hypothyroidism 02/11/2010  .  NAUSEA 02/11/2010  . UTI'S, HX OF 02/11/2010   Past Medical History:  Diagnosis Date  . Arthritis   . 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 Gastric ulcer 2011   EGD, 5/11  . Hypothyroidism    not on meds, followed by Dr. Elyse Hsu  . Leukocytosis 11/20/2015  . Lipoma    left upper arm  . Pericardial effusion    a. HCAP complicated by pericardial effusion requiring pericardial window 10/2016 and large L pleural effusion requring VATS.  Marland Kitchen Pleural effusion    a. s/p VATS 2017.  Marland Kitchen Pneumonia 10/2016  . PONV (postoperative nausea and vomiting)   . Prolonged QT interval   . Urine incontinence   . UTI (lower urinary tract infection)    frequent    Past Surgical History:  Procedure Laterality Date  . BRAVO [place] STUDY  12/04/2012   Procedure: BRAVO [place];  Surgeon: [name], MD;  Location: [facility];  Service: Endoscopy;  Laterality: N/A;  . CHOLECYSTECTOMY N/A 01/03/2014   Procedure: LAPAROSCOPIC CHOLECYSTECTOMY WITH INTRAOPERATIVE CHOLANGIOGRAM;  Surgeon: Joyice Faster. Cornett, MD;  Location: [facility];  Service: General;  Laterality: N/A;  . COLONOSCOPY  01/2004   DR [facility], few small tics  . ESOPHAGOGASTRODUODENOSCOPY  02/2010   gastric ulcers  . ESOPHAGOGASTRODUODENOSCOPY  12/04/2012   [id number] gastritis (inflammation) was found in the gastric antrum; multiple biopsies The duodenal mucosa showed no abnormalities in the bulb and second portion of the duodenum  . JOINT REPLACEMENT Right 10/22/2019  . KYPHOPLASTY N/A 02/14/2019   Procedure: KYPHOPLASTY T12 and L1;  Surgeon: Melina Schools, MD;  Location: [place];  Service: Orthopedics;  Laterality: N/A;  120 mins  .  LIPOMA EXCISION  08/02/2011   left shoulder  . NOSE SURGERY    . PARTIAL HYSTERECTOMY     vaginal at age 89 years of age  . TOE DEBRIDEMENT Left 1962   lt great toe  . TOTAL KNEE ARTHROPLASTY Right 10/22/2019   Procedure: TOTAL KNEE ARTHROPLASTY;  Surgeon: Gaynelle Arabian, MD;  Location: [facility];  Service: Orthopedics;  Laterality: Right;  87min  . VIDEO ASSISTED THORACOSCOPY (VATS)/EMPYEMA Left 10/25/2016   Procedure: VIDEO ASSISTED THORACOSCOPY (VATS), BRONCH,DRAINAGE OF PLEURAL EFFUSION,PERICARDIAL WINDOW WITH DRAINAGE OF PERICARDIAL FLUID, TEE;  Surgeon: [name], MD;  Location: [place];  Service: Thoracic;  Laterality: Left;  Marland Kitchen VIDEO BRONCHOSCOPY N/A 10/25/2016   Procedure: VIDEO BRONCHOSCOPY;  Surgeon: [name], MD;  Location: [facility] OR;  Service: Thoracic;  Laterality: N/A;       Current Outpatient Medications  Medication Sig Dispense Refill Last Dose  . acetaminophen (TYLENOL) 500 MG tablet Take 1,000 mg by mouth every 6 (six) hours as needed for mild pain or moderate pain.      . cetirizine (ZYRTEC) 10 MG tablet Take 10 mg by mouth at bedtime.      . Cholecalciferol (VITAMIN D3) 50 MCG (2000 UT) TABS Take 2,000 mg by mouth daily.      Marland Kitchen escitalopram (LEXAPRO) 20 MG tablet Take 1.5 tablets (30 mg total) by mouth daily. 60 tablet 5   . Lactobacillus-Inulin (PROBIOTIC DIGESTIVE SUPPORT PO) Take 2 tablets by mouth at bedtime. Gummie     . lactose free nutrition (BOOST) LIQD Take 237 mLs by mouth daily.     Marland Kitchen levothyroxine (SYNTHROID) 125 MCG tablet TAKE 1 TABLET (125 MCG TOTAL) BY MOUTH DAILY BEFORE BREAKFAST. 90 tablet 1   . Multiple Vitamins-Minerals (CENTRUM SILVER 50+WOMEN) TABS Take 1 tablet by mouth daily.     . nilotinib (TASIGNA) 150 MG capsule Take 2 capsules (300 mg total) by mouth every 12 (twelve) hours. (Patient taking differently: Take 300 mg by mouth every 12 (twelve) hours. ) 120 capsule 6   . vitamin B-12 (CYANOCOBALAMIN) 1000 MCG tablet Take 1,000 mcg by mouth daily.     . Vitamin E 180 MG CAPS Take 360 mg by mouth daily.      . rivaroxaban (XARELTO) 10 MG TABS tablet Take 1 tablet (10 mg total) by mouth daily with breakfast for 20 days. Take one tablet Xarelto once a day for three weeks following surgery. Then change  to one baby Aspirin (81 mg) once a day for three weeks. Then discontinue Aspirin. (Patient not taking: Reported on 02/19/2020) 20 tablet 0 Not Taking at Unknown time   Allergies  Allergen Reactions  . Codeine Nausea Only and Other (See Comments)    Headache  . Sulfonamide Derivatives Nausea Only and Other (See Comments)    Headache  . Esomeprazole Magnesium Nausea Only  . Fluconazole Rash    Redness and blistering on left thigh    Social History   Tobacco Use  . Smoking status: Never Smoker  . Smokeless tobacco: Never Used  Substance Use Topics  . Alcohol use: No    Alcohol/week: 0.0 standard drinks    Family History  Problem Relation Age of Onset  . Lung cancer Father   . COPD Mother   . Colon cancer Neg Hx   . Anesthesia problems Neg Hx   . Hypotension Neg Hx   . Malignant hyperthermia Neg Hx   . Pseudochol deficiency Neg Hx  Review of Systems  Constitutional: Negative.   HENT: Negative.   Eyes: Negative.   Respiratory: Negative.   Cardiovascular: Negative.   Gastrointestinal: Negative.   Endocrine: Negative.   Genitourinary: Negative.   Musculoskeletal: Positive for arthralgias, back pain, joint swelling and myalgias. Negative for gait problem, neck pain and neck stiffness.  Skin: Negative.   Allergic/Immunologic: Negative.   Neurological: Negative.   Hematological: Negative.   Psychiatric/Behavioral: Negative.     Objective:  Physical Exam  Constitutional: She is oriented to person, place, and time. She appears well-developed. No distress.  Morbidly obese  HENT:  Head: Normocephalic and atraumatic.  Right Ear: External ear normal.  Left Ear: External ear normal.  Nose: Nose normal.  Mouth/Throat: Oropharynx is clear and moist.  Eyes: Conjunctivae and EOM are normal.  Cardiovascular: Normal rate, regular rhythm, normal heart sounds and intact distal pulses. Exam reveals no gallop.  No murmur heard. Respiratory: Effort normal and breath sounds  normal. No respiratory distress. She has no wheezes.  GI: Soft. She exhibits no distension. There is no abdominal tenderness.  Musculoskeletal:     Cervical back: Normal range of motion and neck supple.     Comments: Right knee exam: Patient has well healed incisions, no erythema, no exudate, dry, and nontender. There is no swelling. Range of motion is 5-115 degrees. The knee is stable.  Left Knee Exam: No effusion. No Swelling. Range of motion is 5-125 degrees. No crepitus on range of motion of the knee. Mild medial joint line tenderness No lateral joint line tenderness. Stable knee.  Mild edema in the bilateral lower legs and has very large lower legs due to her soft tissue distribution.  Neurological: She is alert and oriented to person, place, and time. She has normal strength. No sensory deficit.  Skin: No rash noted. She is not diaphoretic. No erythema.  Psychiatric: She has a normal mood and affect. Her behavior is normal.    Ht: 5 ft 4 in  Wt: 219.2 lbs  BMI: 37.6  BP: 122/70 sitting L arm  Pulse: 72 bpm regular   Imaging Review Plain radiographs demonstrate severe degenerative joint disease of the left knee(s). The overall alignment ismild varus. The bone quality appears to be good for age and reported activity level.    Assessment/Plan:  End stage primary osteoarthritis, left knee   The patient history, physical examination, clinical judgment of the provider and imaging studies are consistent with end stage degenerative joint disease of the left knee(s) and total knee arthroplasty is deemed medically necessary. The treatment options including medical management, injection therapy arthroscopy and arthroplasty were discussed at length. The risks and benefits of total knee arthroplasty were presented and reviewed. The risks due to aseptic loosening, infection, stiffness, patella tracking problems, thromboembolic complications and other imponderables were discussed. The  patient acknowledged the explanation, agreed to proceed with the plan and consent was signed. Patient is being admitted for inpatient treatment for surgery, pain control, PT, OT, prophylactic antibiotics, VTE prophylaxis, progressive ambulation and ADL's and discharge planning. The patient is planning to be discharged home.     Anticipated LOS equal to or greater than 2 midnights due to - Age 79 and older with one or more of the following:  - Obesity  - Expected need for hospital services (PT, OT, Nursing) required for safe  discharge  - Anticipated need for postoperative skilled nursing care or inpatient rehab  - Active co-morbidities: None OR   - Unanticipated findings during/Post Surgery:  None  - Patient is a high risk of re-admission due to: None    Risks and benefits of the surgery were discussed with the patient and Dr.Aluisio at their previous office visit, and the patient has elected to move forward with the aforementioned surgery. Post-operative care plans were discussed with the patient today.    Therapy Plans: outpatient therapy at [facility] Disposition: Home with husband Planned DVT prophylaxis: Xarelto 10mg  daily (hx of cancer) DME needed: has equipment PCP: Dr. Holly Bodily Cardio: Dr. Roxan Hockey Oncology: Dr. Delton Coombes- will continue to Tasigna due to increase in WBC when she stopped taking it before her R TKA Other: history of severe nausea/vomiting with anesthesia Instructed patient on meds to stop prior to surgery  Ardeen Jourdain, PA-C

## 2020-02-21 ENCOUNTER — Inpatient Hospital Stay (HOSPITAL_COMMUNITY): Payer: Medicare Other | Attending: Hematology

## 2020-02-21 ENCOUNTER — Other Ambulatory Visit: Payer: Self-pay

## 2020-02-21 DIAGNOSIS — Z9221 Personal history of antineoplastic chemotherapy: Secondary | ICD-10-CM | POA: Diagnosis not present

## 2020-02-21 DIAGNOSIS — G479 Sleep disorder, unspecified: Secondary | ICD-10-CM | POA: Insufficient documentation

## 2020-02-21 DIAGNOSIS — R5383 Other fatigue: Secondary | ICD-10-CM | POA: Insufficient documentation

## 2020-02-21 DIAGNOSIS — C9210 Chronic myeloid leukemia, BCR/ABL-positive, not having achieved remission: Secondary | ICD-10-CM | POA: Insufficient documentation

## 2020-02-21 DIAGNOSIS — R61 Generalized hyperhidrosis: Secondary | ICD-10-CM | POA: Insufficient documentation

## 2020-02-21 DIAGNOSIS — E039 Hypothyroidism, unspecified: Secondary | ICD-10-CM | POA: Diagnosis not present

## 2020-02-21 DIAGNOSIS — M1990 Unspecified osteoarthritis, unspecified site: Secondary | ICD-10-CM | POA: Insufficient documentation

## 2020-02-21 DIAGNOSIS — Z79899 Other long term (current) drug therapy: Secondary | ICD-10-CM | POA: Diagnosis not present

## 2020-02-21 DIAGNOSIS — M25562 Pain in left knee: Secondary | ICD-10-CM | POA: Insufficient documentation

## 2020-02-21 LAB — CBC WITH DIFFERENTIAL/PLATELET
Abs Immature Granulocytes: 0.1 10*3/uL — ABNORMAL HIGH (ref 0.00–0.07)
Basophils Absolute: 0 10*3/uL (ref 0.0–0.1)
Basophils Relative: 0 %
Eosinophils Absolute: 0.2 10*3/uL (ref 0.0–0.5)
Eosinophils Relative: 2 %
HCT: 38.9 % (ref 36.0–46.0)
Hemoglobin: 11.8 g/dL — ABNORMAL LOW (ref 12.0–15.0)
Immature Granulocytes: 1 %
Lymphocytes Relative: 14 %
Lymphs Abs: 1.7 10*3/uL (ref 0.7–4.0)
MCH: 26.9 pg (ref 26.0–34.0)
MCHC: 30.3 g/dL (ref 30.0–36.0)
MCV: 88.8 fL (ref 80.0–100.0)
Monocytes Absolute: 0.8 10*3/uL (ref 0.1–1.0)
Monocytes Relative: 6 %
Neutro Abs: 9.7 10*3/uL — ABNORMAL HIGH (ref 1.7–7.7)
Neutrophils Relative %: 77 %
Platelets: 350 10*3/uL (ref 150–400)
RBC: 4.38 MIL/uL (ref 3.87–5.11)
RDW: 14.5 % (ref 11.5–15.5)
WBC: 12.6 10*3/uL — ABNORMAL HIGH (ref 4.0–10.5)
nRBC: 0 % (ref 0.0–0.2)

## 2020-02-21 LAB — COMPREHENSIVE METABOLIC PANEL
ALT: 18 U/L (ref 0–44)
AST: 27 U/L (ref 15–41)
Albumin: 3.9 g/dL (ref 3.5–5.0)
Alkaline Phosphatase: 160 U/L — ABNORMAL HIGH (ref 38–126)
Anion gap: 10 (ref 5–15)
BUN: 23 mg/dL (ref 8–23)
CO2: 25 mmol/L (ref 22–32)
Calcium: 9.3 mg/dL (ref 8.9–10.3)
Chloride: 106 mmol/L (ref 98–111)
Creatinine, Ser: 0.95 mg/dL (ref 0.44–1.00)
GFR calc Af Amer: >60 mL/min (ref >60)
GFR calc non Af Amer: 59 mL/min — ABNORMAL LOW (ref >60)
Glucose, Bld: 126 mg/dL — ABNORMAL HIGH (ref 70–99)
Potassium: 4.4 mmol/L (ref 3.5–5.1)
Sodium: 141 mmol/L (ref 135–145)
Total Bilirubin: 0.7 mg/dL (ref 0.3–1.2)
Total Protein: 8.2 g/dL — ABNORMAL HIGH (ref 6.5–8.1)

## 2020-02-21 NOTE — Progress Notes (Signed)
PCP - Benny Lennert, MD  Cardiologist - Kate Sable, MD Chest x-ray - CT chest 06-14-19 EKG - 12-25-18 Stress Test -  ECHO -  Cardiac Cath -   Sleep Study -  CPAP -   Fasting Blood Sugar -  Checks Blood Sugar _____ times a day  Blood Thinner Instructions: Aspirin Instructions: Last Dose:  Anesthesia review: Hx of  PE  Patient denies shortness of breath, fever, cough and chest pain at PAT appointment   Patient verbalized understanding of instructions that were given to them at the PAT appointment. Patient was also instructed that they will need to review over the PAT instructions again at home before surgery.

## 2020-02-21 NOTE — Patient Instructions (Addendum)
DUE TO COVID-19 ONLY ONE VISITOR IS ALLOWED TO COME WITH YOU AND STAY IN THE WAITING ROOM ONLY DURING PRE OP AND PROCEDURE DAY OF SURGERY. THE 1 VISITOR MAY VISIT WITH YOU AFTER SURGERY IN YOUR PRIVATE ROOM DURING VISITING HOURS ONLY!  YOU NEED TO HAVE A COVID 19 TEST ON 02-28-20 @2 :05 PM, THIS TEST MUST BE DONE BEFORE SURGERY, GO TO [place], [place]. ONCE YOUR COVID TEST IS COMPLETED, PLEASE BEGIN THE QUARANTINE INSTRUCTIONS AS OUTLINED IN YOUR HANDOUT.                Andrea Martinez  02/21/2020   Your procedure is scheduled on: 03-03-20   Report to [facility] Main  Entrance    Report to Admitting at 9:05 AM     Call this number if you have problems the morning of surgery [phone]    Remember: NO SOLID FOOD AFTER MIDNIGHT THE NIGHT PRIOR TO SURGERY. NOTHING BY MOUTH EXCEPT CLEAR LIQUIDS UNTIL 8:35 AM . PLEASE FINISH ENSURE DRINK PER SURGEON ORDER  WHICH NEEDS TO BE COMPLETED AT 8:35 AM .   CLEAR LIQUID DIET   Foods Allowed                                                                     Foods Excluded  Coffee and tea, regular and decaf                             liquids that you cannot  Plain Jell-O any favor except red or purple                                           see through such as: Fruit ices (not with fruit pulp)                                     milk, soups, orange juice  Iced Popsicles                                    All solid food Carbonated beverages, regular and diet                                    Cranberry, grape and apple juices Sports drinks like Gatorade Lightly seasoned clear broth or consume(fat free) Sugar, honey syrup  Sample Menu Breakfast                                Lunch                                     Supper Cranberry juice  Beef broth                            Chicken broth Jell-O                                     Grape juice                           Apple juice Coffee or tea                         Jell-O                                      Popsicle                                                Coffee or tea                        Coffee or tea  _____________________________________________________________________      Take these medicines the morning of surgery with A SIP OF WATER: Levothyroxine (Sythroid)  BRUSH YOUR TEETH MORNING OF SURGERY AND RINSE YOUR MOUTH OUT, NO CHEWING GUM CANDY OR MINTS.                               You may not have any metal on your body including hair pins and              piercings     Do not wear jewelry, make-up, lotions, powders or perfumes, deodorant              Do not wear nail polish on your fingernails.  Do not shave  48 hours prior to surgery.     Do not bring valuables to the hospital. [place].  Contacts, dentures or bridgework may not be worn into surgery.  Leave suitcase in the car. After surgery it may be brought to your room.     Special Instructions: N/A              Please read over the following fact sheets you were given: _____________________________________________________________________             [facility] - Preparing for Surgery Before surgery, you can play an important role.  Because skin is not sterile, your skin needs to be as free of germs as possible.  You can reduce the number of germs on your skin by washing with CHG (chlorahexidine gluconate) soap before surgery.  CHG is an antiseptic cleaner which kills germs and bonds with the skin to continue killing germs even after washing. Please DO NOT use if you have an allergy to CHG or antibacterial soaps.  If your skin becomes reddened/irritated stop using the CHG and inform your nurse when you arrive at Short Stay. Do not shave (including legs and underarms) for at least 48 hours  prior to the first CHG shower.  You may shave your face/neck. Please follow these instructions  carefully:  1.  Shower with CHG Soap the night before surgery and the  morning of Surgery.  2.  If you choose to wash your hair, wash your hair first as usual with your  normal  shampoo.  3.  After you shampoo, rinse your hair and body thoroughly to remove the  shampoo.                           4.  Use CHG as you would any other liquid soap.  You can apply chg directly  to the skin and wash                       Gently with a scrungie or clean washcloth.  5.  Apply the CHG Soap to your body ONLY FROM THE NECK DOWN.   Do not use on face/ open                           Wound or open sores. Avoid contact with eyes, ears mouth and genitals (private parts).                       Wash face,  Genitals (private parts) with your normal soap.             6.  Wash thoroughly, paying special attention to the area where your surgery  will be performed.  7.  Thoroughly rinse your body with warm water from the neck down.  8.  DO NOT shower/wash with your normal soap after using and rinsing off  the CHG Soap.                9.  Pat yourself dry with a clean towel.            10.  Wear clean pajamas.            11.  Place clean sheets on your bed the night of your first shower and do not  sleep with pets. Day of Surgery : Do not apply any lotions/deodorants the morning of surgery.  Please wear clean clothes to the hospital/surgery center.  FAILURE TO FOLLOW THESE INSTRUCTIONS MAY RESULT IN THE CANCELLATION OF YOUR SURGERY PATIENT SIGNATURE_________________________________  NURSE SIGNATURE__________________________________  ________________________________________________________________________   [name]  An incentive spirometer is a tool that can help keep your lungs clear and active. This tool measures how well you are filling your lungs with each breath. Taking long deep breaths may help reverse or decrease the chance of developing breathing (pulmonary) problems (especially infection)  following:  A long period of time when you are unable to move or be active. BEFORE THE PROCEDURE   If the spirometer includes an indicator to show your best effort, your nurse or respiratory therapist will set it to a desired goal.  If possible, sit up straight or lean slightly forward. Try not to slouch.  Hold the incentive spirometer in an upright position. INSTRUCTIONS FOR USE  1. Sit on the edge of your bed if possible, or sit up as far as you can in bed or on a chair. 2. Hold the incentive spirometer in an upright position. 3. Breathe out normally. 4. Place the mouthpiece in your mouth and seal your lips tightly around it. 5. Breathe  in slowly and as deeply as possible, raising the piston or the ball toward the top of the column. 6. Hold your breath for 3-5 seconds or for as long as possible. Allow the piston or ball to fall to the bottom of the column. 7. Remove the mouthpiece from your mouth and breathe out normally. 8. Rest for a few seconds and repeat Steps 1 through 7 at least 10 times every 1-2 hours when you are awake. Take your time and take a few normal breaths between deep breaths. 9. The spirometer may include an indicator to show your best effort. Use the indicator as a goal to work toward during each repetition. 10. After each set of 10 deep breaths, practice coughing to be sure your lungs are clear. If you have an incision (the cut made at the time of surgery), support your incision when coughing by placing a pillow or rolled up towels firmly against it. Once you are able to get out of bed, walk around indoors and cough well. You may stop using the incentive spirometer when instructed by your caregiver.  RISKS AND COMPLICATIONS  Take your time so you do not get dizzy or light-headed.  If you are in pain, you may need to take or ask for pain medication before doing incentive spirometry. It is harder to take a deep breath if you are having pain. AFTER USE  Rest and  breathe slowly and easily.  It can be helpful to keep track of a log of your progress. Your caregiver can provide you with a simple table to help with this. If you are using the spirometer at home, follow these instructions: [place] IF:   You are having difficultly using the spirometer.  You have trouble using the spirometer as often as instructed.  Your pain medication is not giving enough relief while using the spirometer.  You develop fever of 100.5 F (38.1 C) or higher. SEEK IMMEDIATE MEDICAL CARE IF:   You cough up bloody sputum that had not been present before.  You develop fever of 102 F (38.9 C) or greater.  You develop worsening pain at or near the incision site. MAKE SURE YOU:   Understand these instructions.  Will watch your condition.  Will get help right away if you are not doing well or get worse. Document Released: 04/11/2007 Document Revised: 02/21/2012 Document Reviewed: 06/12/2007 Exit[employer] Patient Information 2014 [employer], [place].   ________________________________________________________________________  WHAT IS A BLOOD TRANSFUSION? Blood Transfusion Information  A transfusion is the replacement of blood or some of its parts. Blood is made up of multiple cells which provide different functions.  Red blood cells carry oxygen and are used for blood loss replacement.  White blood cells fight against infection.  Platelets control bleeding.  Plasma helps clot blood.  Other blood products are available for specialized needs, such as hemophilia or other clotting disorders. BEFORE THE TRANSFUSION  Who gives blood for transfusions?   Healthy volunteers who are fully evaluated to make sure their blood is safe. This is blood bank blood. Transfusion therapy is the safest it has ever been in the practice of medicine. Before blood is taken from a donor, a complete history is taken to make sure that person has no history of diseases nor engages in  risky social behavior (examples are intravenous drug use or sexual activity with multiple partners). The donor's travel history is screened to minimize risk of transmitting infections, such as malaria. The donated blood is tested  for signs of infectious diseases, such as HIV and hepatitis. The blood is then tested to be sure it is compatible with you in order to minimize the chance of a transfusion reaction. If you or a relative donates blood, this is often done in anticipation of surgery and is not appropriate for emergency situations. It takes many days to process the donated blood. RISKS AND COMPLICATIONS Although transfusion therapy is very safe and saves many lives, the main dangers of transfusion include:   Getting an infectious disease.  Developing a transfusion reaction. This is an allergic reaction to something in the blood you were given. Every precaution is taken to prevent this. The decision to have a blood transfusion has been considered carefully by your caregiver before blood is given. Blood is not given unless the benefits outweigh the risks. AFTER THE TRANSFUSION  Right after receiving a blood transfusion, you will usually feel much better and more energetic. This is especially true if your red blood cells have gotten low (anemic). The transfusion raises the level of the red blood cells which carry oxygen, and this usually causes an energy increase.  The nurse administering the transfusion will monitor you carefully for complications. HOME CARE INSTRUCTIONS  No special instructions are needed after a transfusion. You may find your energy is better. Speak with your caregiver about any limitations on activity for underlying diseases you may have. SEEK MEDICAL CARE IF:   Your condition is not improving after your transfusion.  You develop redness or irritation at the intravenous (IV) site. SEEK IMMEDIATE MEDICAL CARE IF:  Any of the following symptoms occur over the next 12  hours:  Shaking chills.  You have a temperature by mouth above 102 F (38.9 C), not controlled by medicine.  Chest, back, or muscle pain.  People around you feel you are not acting correctly or are confused.  Shortness of breath or difficulty breathing.  Dizziness and fainting.  You get a rash or develop hives.  You have a decrease in urine output.  Your urine turns a dark color or changes to pink, red, or brown. Any of the following symptoms occur over the next 10 days:  You have a temperature by mouth above 102 F (38.9 C), not controlled by medicine.  Shortness of breath.  Weakness after normal activity.  The white part of the eye turns yellow (jaundice).  You have a decrease in the amount of urine or are urinating less often.  Your urine turns a dark color or changes to pink, red, or brown. Document Released: 11/26/2000 Document Revised: 02/21/2012 Document Reviewed: 07/15/2008 [facility] Patient Information 2014 [place], [place].  _______________________________________________________________________

## 2020-02-22 ENCOUNTER — Encounter (HOSPITAL_COMMUNITY)
Admission: RE | Admit: 2020-02-22 | Discharge: 2020-02-22 | Disposition: A | Discharge status: Home / Self Care | Payer: Medicare Other | Source: Ambulatory Visit | Attending: Orthopedic Surgery | Admitting: Orthopedic Surgery

## 2020-02-22 ENCOUNTER — Other Ambulatory Visit: Payer: Self-pay

## 2020-02-22 ENCOUNTER — Encounter (HOSPITAL_COMMUNITY): Payer: Self-pay

## 2020-02-22 DIAGNOSIS — Z20822 Contact with and (suspected) exposure to covid-19: Secondary | ICD-10-CM | POA: Insufficient documentation

## 2020-02-22 DIAGNOSIS — Z86711 Personal history of pulmonary embolism: Secondary | ICD-10-CM | POA: Insufficient documentation

## 2020-02-22 DIAGNOSIS — Z01818 Encounter for other preprocedural examination: Secondary | ICD-10-CM | POA: Insufficient documentation

## 2020-02-22 DIAGNOSIS — I10 Essential (primary) hypertension: Secondary | ICD-10-CM | POA: Insufficient documentation

## 2020-02-22 LAB — CBC
HCT: 38.5 % (ref 36.0–46.0)
Hemoglobin: 11.6 g/dL — ABNORMAL LOW (ref 12.0–15.0)
MCH: 27 pg (ref 26.0–34.0)
MCHC: 30.1 g/dL (ref 30.0–36.0)
MCV: 89.5 fL (ref 80.0–100.0)
Platelets: 314 10*3/uL (ref 150–400)
RBC: 4.3 MIL/uL (ref 3.87–5.11)
RDW: 14.7 % (ref 11.5–15.5)
WBC: 12.1 10*3/uL — ABNORMAL HIGH (ref 4.0–10.5)
nRBC: 0 % (ref 0.0–0.2)

## 2020-02-22 LAB — COMPREHENSIVE METABOLIC PANEL
ALT: 17 U/L (ref 0–44)
AST: 25 U/L (ref 15–41)
Albumin: 3.9 g/dL (ref 3.5–5.0)
Alkaline Phosphatase: 150 U/L — ABNORMAL HIGH (ref 38–126)
Anion gap: 10 (ref 5–15)
BUN: 22 mg/dL (ref 8–23)
CO2: 25 mmol/L (ref 22–32)
Calcium: 9.2 mg/dL (ref 8.9–10.3)
Chloride: 106 mmol/L (ref 98–111)
Creatinine, Ser: 0.85 mg/dL (ref 0.44–1.00)
GFR calc Af Amer: >60 mL/min (ref >60)
GFR calc non Af Amer: >60 mL/min (ref >60)
Glucose, Bld: 103 mg/dL — ABNORMAL HIGH (ref 70–99)
Potassium: 4.2 mmol/L (ref 3.5–5.1)
Sodium: 141 mmol/L (ref 135–145)
Total Bilirubin: 0.7 mg/dL (ref 0.3–1.2)
Total Protein: 7.9 g/dL (ref 6.5–8.1)

## 2020-02-22 LAB — PROTIME-INR
INR: 1 (ref 0.8–1.2)
Prothrombin Time: 13 seconds (ref 11.4–15.2)

## 2020-02-22 LAB — APTT: aPTT: 24 seconds (ref 24–36)

## 2020-02-25 LAB — SURGICAL PCR SCREEN
MRSA, PCR: NEGATIVE
Staphylococcus aureus: NEGATIVE

## 2020-02-26 LAB — BCR-ABL1, CML/ALL, PCR, QUANT: Interpretation (BCRAL):: NEGATIVE

## 2020-02-27 ENCOUNTER — Ambulatory Visit (INDEPENDENT_AMBULATORY_CARE_PROVIDER_SITE_OTHER): Payer: Medicare Other | Admitting: Student

## 2020-02-27 ENCOUNTER — Other Ambulatory Visit: Payer: Self-pay | Admitting: Family Medicine

## 2020-02-27 ENCOUNTER — Encounter: Payer: Self-pay | Admitting: Student

## 2020-02-27 ENCOUNTER — Telehealth: Payer: Self-pay | Admitting: Family Medicine

## 2020-02-27 VITALS — BP 150/72 | HR 82 | Temp 95.6°F | Ht 65.0 in | Wt 223.0 lb

## 2020-02-27 DIAGNOSIS — C9210 Chronic myeloid leukemia, BCR/ABL-positive, not having achieved remission: Secondary | ICD-10-CM

## 2020-02-27 DIAGNOSIS — I493 Ventricular premature depolarization: Secondary | ICD-10-CM | POA: Diagnosis not present

## 2020-02-27 DIAGNOSIS — R030 Elevated blood-pressure reading, without diagnosis of hypertension: Secondary | ICD-10-CM

## 2020-02-27 DIAGNOSIS — Z01810 Encounter for preprocedural cardiovascular examination: Secondary | ICD-10-CM

## 2020-02-27 NOTE — Telephone Encounter (Signed)
Spoke to patient to let her know Dr. Holly Bodily wanted her to see a cardiologist before she could me medically cleared for her surgery on Monday 3/22. Cardiology could see patient today at 3pm. Pt states she will go too appt.

## 2020-02-27 NOTE — Patient Instructions (Signed)
Medication Instructions: Your physician recommends that you continue on your current medications as directed. Please refer to the Current Medication list given to you today.  *If you need a refill on your cardiac medications before your next appointment, please call your pharmacy*   Lab Work: None today If you have labs (blood work) drawn today and your tests are completely normal, you will receive your results only by: [facility] MyChart Message (if you have MyChart) OR . A paper copy in the mail If you have any lab test that is abnormal or we need to change your treatment, we will call you to review the results.   Testing/Procedures: Your physician has requested that you have an echocardiogram after your knee surgery. Echocardiography is a painless test that uses sound waves to create images of your heart. It provides your doctor with information about the size and shape of your heart and how well your heart's chambers and valves are working. This procedure takes approximately one hour. There are no restrictions for this procedure.     Follow-Up: At [facility], you and your health needs are our priority.  As part of our continuing mission to provide you with exceptional heart care, we have created designated Provider Care Teams.  These Care Teams include your primary Cardiologist (physician) and Advanced Practice Providers (APPs -  Physician Assistants and Nurse Practitioners) who all work together to provide you with the care you need, when you need it.  We recommend signing up for the patient portal called "MyChart".  Sign up information is provided on this After Visit Summary.  MyChart is used to connect with patients for Virtual Visits (Telemedicine).  Patients are able to view lab/test results, encounter notes, upcoming appointments, etc.  Non-urgent messages can be sent to your provider as well.   To learn more about what you can do with MyChart, go to [email or web address].    Your  next appointment:   12 month(s)  The format for your next appointment:   In Person  Provider:   Kate Sable, MD   Other Instructions None       Thank you for choosing [facility] !

## 2020-02-27 NOTE — Progress Notes (Addendum)
Cardiology Office Note    Date:  02/27/2020   ID:  Andrea, Martinez [birth year], MRN [id number]  PCP:  Maryruth Hancock, MD  Cardiologist: Kate Sable, MD    Chief Complaint  Patient presents with  . Pre-op Exam    History of Present Illness:    Andrea Martinez is a 74 y.o. female with past medical history of CML, Hypothyroidism, pericardial effusion in the setting of HCAP (s/p window in 10/2016) and history of prolonged QT who presents to the office today for overdue follow-up.  She was last examined by Melina Copa, PA-C in 04/2018 and denied any chest pain, dyspnea or palpitations at that time. She did report episodes of diaphoresis which had occurred when she was diagnosed with CML in the past. Recent EKG showed PAC's but no significant abnormalities and no further testing was pursued.  By review of notes, she underwent total right knee replacement 10/2019 with no acute complications noted. She is scheduled to undergo left knee replacement next week and had an EKG on 02/22/2020 as part of her preoperative clearance which showed NSR with PVC's, therefore Cardiology evaluation was recommended.   In talking with the patient today, she reports doing well from a cardiac perspective since her last visit. She denies any recent chest pain, dyspnea on exertion, orthopnea or PND. She denies any palpitations and was unaware of her ectopy. She does not consume caffeine or alcohol.    She does have chronic lower extremity edema which has overall been stable. Her activity has been limited given her recent knee replacement and significant arthritis along her left knee but she is able to perform routine household chores without any symptoms.  BP was elevated at 150/72 during today's visit but she reports having worsening pain along her left knee which has been radiating into her back.  Past Medical History:  Diagnosis Date  . Arthritis   . Chronic myelogenous leukemia (CML), BCR-ABL1-positive  ([place]) 11/20/2015  . Gastric ulcer 2011   EGD, 5/11  . Hypothyroidism    not on meds, followed by Dr. Elyse Hsu  . Leukocytosis 11/20/2015  . Lipoma    left upper arm  . Pericardial effusion    a. HCAP complicated by pericardial effusion requiring pericardial window 10/2016 and large L pleural effusion requring VATS.  Marland Kitchen Pleural effusion    a. s/p VATS 2017.  Marland Kitchen Pneumonia 10/2016  . PONV (postoperative nausea and vomiting)   . Prolonged QT interval   . Urine incontinence   . UTI (lower urinary tract infection)    frequent    Past Surgical History:  Procedure Laterality Date  . BRAVO [place] STUDY  12/04/2012   Procedure: BRAVO [place];  Surgeon: [name], MD;  [facility];  Service: Endoscopy;  Laterality: N/A;  . CHOLECYSTECTOMY N/A 01/03/2014   Procedure: LAPAROSCOPIC CHOLECYSTECTOMY WITH INTRAOPERATIVE CHOLANGIOGRAM;  Surgeon: Joyice Faster. Cornett, MD;  Location: [place];  Service: General;  Laterality: N/A;  . COLONOSCOPY  01/2004   DR [facility], few small tics  . ESOPHAGOGASTRODUODENOSCOPY  02/2010   gastric ulcers  . ESOPHAGOGASTRODUODENOSCOPY  12/04/2012   [id number] gastritis (inflammation) was found in the gastric antrum; multiple biopsies The duodenal mucosa showed no abnormalities in the bulb and second portion of the duodenum  . JOINT REPLACEMENT Right 10/22/2019  . KYPHOPLASTY N/A 02/14/2019   Procedure: KYPHOPLASTY T12 and L1;  Surgeon: Melina Schools, MD;  Location: [place];  Service: Orthopedics;  Laterality:  N/A;  120 mins  . LIPOMA EXCISION  08/02/2011   left shoulder  . NOSE SURGERY    . PARTIAL HYSTERECTOMY     vaginal at age 72 years of age  . TOE DEBRIDEMENT Left 1962   lt great toe  . TOTAL KNEE ARTHROPLASTY Right 10/22/2019   Procedure: TOTAL KNEE ARTHROPLASTY;  Surgeon: Gaynelle Arabian, MD;  Location: [facility];  Service: Orthopedics;  Laterality: Right;  34min  . VIDEO ASSISTED THORACOSCOPY (VATS)/EMPYEMA Left 10/25/2016   Procedure:  VIDEO ASSISTED THORACOSCOPY (VATS), BRONCH,DRAINAGE OF PLEURAL EFFUSION,PERICARDIAL WINDOW WITH DRAINAGE OF PERICARDIAL FLUID, TEE;  Surgeon: [name], MD;  Location: [place];  Service: Thoracic;  Laterality: Left;  Marland Kitchen VIDEO BRONCHOSCOPY N/A 10/25/2016   Procedure: VIDEO BRONCHOSCOPY;  Surgeon: [name], MD;  Location: [facility] OR;  Service: Thoracic;  Laterality: N/A;    Current Medications: Outpatient Medications Prior to Visit  Medication Sig Dispense Refill  . acetaminophen (TYLENOL) 500 MG tablet Take 1,000 mg by mouth every 6 (six) hours as needed for mild pain or moderate pain.     . cetirizine (ZYRTEC) 10 MG tablet Take 10 mg by mouth at bedtime.     . Cholecalciferol (VITAMIN D3) 50 MCG (2000 UT) TABS Take 2,000 mg by mouth daily.     Marland Kitchen escitalopram (LEXAPRO) 20 MG tablet Take 1.5 tablets (30 mg total) by mouth daily. 60 tablet 5  . ferrous sulfate 325 (65 FE) MG tablet ferrous sulfate 325 mg (65 mg iron) tablet  1 tablet po bid x 6 days    . Lactobacillus-Inulin (PROBIOTIC DIGESTIVE SUPPORT PO) Take 2 tablets by mouth at bedtime. Gummie    . lactose free nutrition (BOOST) LIQD Take 237 mLs by mouth daily.    Marland Kitchen levothyroxine (SYNTHROID) 125 MCG tablet TAKE 1 TABLET (125 MCG TOTAL) BY MOUTH DAILY BEFORE BREAKFAST. 90 tablet 1  . Multiple Vitamins-Minerals (CENTRUM SILVER 50+WOMEN) TABS Take 1 tablet by mouth daily.    . nilotinib (TASIGNA) 150 MG capsule Take 2 capsules (300 mg total) by mouth every 12 (twelve) hours. (Patient taking differently: Take 300 mg by mouth every 12 (twelve) hours. ) 120 capsule 6  . vitamin B-12 (CYANOCOBALAMIN) 1000 MCG tablet Take 1,000 mcg by mouth daily.    . Vitamin E 180 MG CAPS Take 360 mg by mouth daily.     . rivaroxaban (XARELTO) 10 MG TABS tablet Take 1 tablet (10 mg total) by mouth daily with breakfast for 20 days. Take one tablet Xarelto once a day for three weeks following surgery. Then change to one baby Aspirin (81 mg) once a day  for three weeks. Then discontinue Aspirin. (Patient not taking: Reported on 02/19/2020) 20 tablet 0   No facility-administered medications prior to visit.     Allergies:   Codeine, Sulfonamide derivatives, Esomeprazole magnesium, and Fluconazole   Social History   Socioeconomic History  . Marital status: Married    Spouse name: Not on file  . Number of children: 2  . Years of education: 12th grade  . Highest education level: Not on file  Occupational History  . Occupation: retired    Fish farm manager: UENPLOYED    Employer: RETIRED  Tobacco Use  . Smoking status: Never Smoker  . Smokeless tobacco: Never Used  Substance and Sexual Activity  . Alcohol use: No    Alcohol/week: 0.0 standard drinks  . Drug use: No  . Sexual activity: Yes    Partners: Male    Birth control/protection:  None  Other Topics Concern  . Not on file  Social History Narrative  . Not on file   Social Determinants of Health   Financial Resource Strain:   . Difficulty of Paying Living Expenses:   Food Insecurity:   . Worried About Charity fundraiser in the Last Year:   . Arboriculturist in the Last Year:   Transportation Needs:   . Film/video editor (Medical):   Marland Kitchen Lack of Transportation (Non-Medical):   Physical Activity:   . Days of Exercise per Week:   . Minutes of Exercise per Session:   Stress:   . Feeling of Stress :   Social Connections:   . Frequency of Communication with Friends and Family:   . Frequency of Social Gatherings with Friends and Family:   . Attends Religious Services:   . Active Member of Clubs or Organizations:   . Attends Archivist Meetings:   Marland Kitchen Marital Status:      Family History:  The patient's family history includes COPD in her mother; Lung cancer in her father.   Review of Systems:   Please see the history of present illness.     General:  No chills, fever, night sweats or weight changes.  Cardiovascular:  No chest pain, dyspnea on exertion, edema,  orthopnea, palpitations, paroxysmal nocturnal dyspnea. Dermatological: No rash, lesions/masses Respiratory: No cough, dyspnea MSK: Positive for left knee pain.  Urologic: No hematuria, dysuria Abdominal:   No nausea, vomiting, diarrhea, bright red blood per rectum, melena, or hematemesis Neurologic:  No visual changes, wkns, changes in mental status. All other systems reviewed and are otherwise negative except as noted above.   Physical Exam:    VS:  BP (!) 150/72   Pulse 82   Temp (!) 95.6 F (35.3 C)   Ht 5\' 5"  (1.651 m)   Wt 223 lb (101.2 kg)   SpO2 98%   BMI 37.11 kg/m    General: Well developed, well nourished,female appearing in no acute distress. Head: Normocephalic, atraumatic, sclera non-icteric.  Neck: No carotid bruits. JVD not elevated.  Lungs: Respirations regular and unlabored, without wheezes or rales.  Heart: Regular rate and rhythm. No S3 or S4.  No murmur, no rubs, or gallops appreciated. Abdomen: Soft, non-tender, non-distended. No obvious abdominal masses. Msk:  Strength and tone appear normal for age. No obvious joint deformities or effusions. Extremities: No clubbing or cyanosis. Trace lower extremity edema bilaterally.  Distal pedal pulses are 2+ bilaterally. Neuro: Alert and oriented X 3. Moves all extremities spontaneously. No focal deficits noted. Psych:  Responds to questions appropriately with a normal affect. Skin: No rashes or lesions noted  Wt Readings from Last 3 Encounters:  02/27/20 223 lb (101.2 kg)  02/22/20 220 lb (99.8 kg)  02/22/20 219 lb (99.3 kg)     Studies/Labs Reviewed:   EKG:  EKG is not ordered today. EKG from 02/22/2020 is reviewed which shows NSR, HR 92 with PVC's. No acute ST abnormalities.   Recent Labs: 07/31/2019: TSH 1.84 02/22/2020: ALT 17; BUN 22; Creatinine, Ser 0.85; Hemoglobin 11.6; Platelets 314; Potassium 4.2; Sodium 141   Lipid Panel No results found for: CHOL, TRIG, HDL, CHOLHDL, VLDL, LDLCALC,  LDLDIRECT  Additional studies/ records that were reviewed today include:   Echocardiogram: 07/2015 Study Conclusions   - Left ventricle: The cavity size was normal. Wall thickness was  increased in a pattern of mild LVH. Systolic function was normal.  The estimated ejection fraction  was in the range of 60% to 65%.  Wall motion was normal; there were no regional wall motion  abnormalities. Diastolic dysfunction noted.   Assessment:    1. Pre-operative cardiovascular examination   2. PVC (premature ventricular contraction)   3. Elevated BP without diagnosis of hypertension      Plan:   In order of problems listed above:  1. Preoperative Cardiac Clearance for Knee Replacement - she underwent knee replacement in 10/2019 without any acute issues and is scheduled for left knee replacement next week. She has no known history of CAD or CHF. She denies any chest pain or dyspnea on exertion. RCRI Risk is overall low at 0.4% risk of a major cardiac event.  - given her EKG showing PVC's, I did review her case with Dr. Bronson Ing and he did not feel preoperative cardiac testing was indicated given her asymptomatic state and low cardiac risk. Of acceptable risk to proceed. Will plan for an echocardiogram in the future as outlined below but this can be performed after her surgery. Will forward today's note to her PCP and [facility] per her request.   2. PVC's - as noted on EKG from last week. She denies any palpitations and only had one ectopic beat over the course of 30 seconds during examination today.  - electrolytes recently checked as part of her preoperative workup and WNL. Hgb stable at 11.6. TSH 1.84 in 07/2019. She is on Tasigna for her CML and by review of UTD, this can cause arrhythmias in 1-10% of patients but she has been on this for several years per her report.  - would plan to obtain an echocardiogram to assess LV function and wall motion but this does not need to be  obtained prior to her upcoming surgery.   3. Elevated BP - BP was elevated at 150/72 during today's visit. Has overall been well-controlled at prior appointments. She reports significant pain along her left leg which I suspect is contributing to her elevated BP. I encouraged her to continue to follow this in the ambulatory setting. Not currently on anti-hypertensive medications.   Medication Adjustments/Labs and Tests Ordered: Current medicines are reviewed at length with the patient today.  Concerns regarding medicines are outlined above.  Medication changes, Labs and Tests ordered today are listed in the Patient Instructions below. Patient Instructions  Medication Instructions: Your physician recommends that you continue on your current medications as directed. Please refer to the Current Medication list given to you today.  *If you need a refill on your cardiac medications before your next appointment, please call your pharmacy*   Lab Work: None today If you have labs (blood work) drawn today and your tests are completely normal, you will receive your results only by: Marland Kitchen MyChart Message (if you have MyChart) OR . A paper copy in the mail If you have any lab test that is abnormal or we need to change your treatment, we will call you to review the results.   Testing/Procedures: Your physician has requested that you have an echocardiogram after your knee surgery. Echocardiography is a painless test that uses sound waves to create images of your heart. It provides your doctor with information about the size and shape of your heart and how well your heart's chambers and valves are working. This procedure takes approximately one hour. There are no restrictions for this procedure.     Follow-Up: At [facility], you and your health needs are our priority.  As part of our  continuing mission to provide you with exceptional heart care, we have created designated Provider Care Teams.  These Care  Teams include your primary Cardiologist (physician) and Advanced Practice Providers (APPs -  Physician Assistants and Nurse Practitioners) who all work together to provide you with the care you need, when you need it.  We recommend signing up for the patient portal called "MyChart".  Sign up information is provided on this After Visit Summary.  MyChart is used to connect with patients for Virtual Visits (Telemedicine).  Patients are able to view lab/test results, encounter notes, upcoming appointments, etc.  Non-urgent messages can be sent to your provider as well.   To learn more about what you can do with MyChart, go to [email or web address].    Your next appointment:   12 month(s)  The format for your next appointment:   In Person  Provider:   Kate Sable, MD   Other Instructions None   Thank you for choosing [facility] !         Signed, Erma Heritage, PA-C  02/27/2020 7:35 PM    [facility] [address] [place], [place] [address] Phone: [phone] Fax: [phone]

## 2020-02-28 ENCOUNTER — Other Ambulatory Visit: Payer: Self-pay

## 2020-02-28 ENCOUNTER — Encounter (HOSPITAL_COMMUNITY): Payer: Self-pay | Admitting: Hematology

## 2020-02-28 ENCOUNTER — Encounter: Payer: Self-pay | Admitting: Family Medicine

## 2020-02-28 ENCOUNTER — Inpatient Hospital Stay (HOSPITAL_BASED_OUTPATIENT_CLINIC_OR_DEPARTMENT_OTHER): Payer: Medicare Other | Admitting: Hematology

## 2020-02-28 ENCOUNTER — Other Ambulatory Visit (HOSPITAL_COMMUNITY)
Admission: RE | Admit: 2020-02-28 | Discharge: 2020-02-28 | Disposition: A | Discharge status: Home / Self Care | Payer: Medicare Other | Source: Ambulatory Visit | Attending: Orthopedic Surgery | Admitting: Orthopedic Surgery

## 2020-02-28 VITALS — BP 127/49 | HR 74 | Temp 96.4°F | Resp 16 | Wt 222.3 lb

## 2020-02-28 DIAGNOSIS — Z20822 Contact with and (suspected) exposure to covid-19: Secondary | ICD-10-CM | POA: Insufficient documentation

## 2020-02-28 DIAGNOSIS — R61 Generalized hyperhidrosis: Secondary | ICD-10-CM | POA: Diagnosis not present

## 2020-02-28 DIAGNOSIS — Z9221 Personal history of antineoplastic chemotherapy: Secondary | ICD-10-CM | POA: Diagnosis not present

## 2020-02-28 DIAGNOSIS — Z79899 Other long term (current) drug therapy: Secondary | ICD-10-CM | POA: Diagnosis not present

## 2020-02-28 DIAGNOSIS — Z01812 Encounter for preprocedural laboratory examination: Secondary | ICD-10-CM | POA: Diagnosis not present

## 2020-02-28 DIAGNOSIS — G479 Sleep disorder, unspecified: Secondary | ICD-10-CM | POA: Diagnosis not present

## 2020-02-28 DIAGNOSIS — C9210 Chronic myeloid leukemia, BCR/ABL-positive, not having achieved remission: Secondary | ICD-10-CM | POA: Diagnosis not present

## 2020-02-28 DIAGNOSIS — M1990 Unspecified osteoarthritis, unspecified site: Secondary | ICD-10-CM | POA: Diagnosis not present

## 2020-02-28 DIAGNOSIS — R5383 Other fatigue: Secondary | ICD-10-CM | POA: Diagnosis not present

## 2020-02-28 DIAGNOSIS — M25562 Pain in left knee: Secondary | ICD-10-CM | POA: Diagnosis not present

## 2020-02-28 DIAGNOSIS — E039 Hypothyroidism, unspecified: Secondary | ICD-10-CM | POA: Diagnosis not present

## 2020-02-28 MED ORDER — ESCITALOPRAM OXALATE 20 MG PO TABS: 30.0000 mg | ORAL_TABLET | Freq: Every day | ORAL | 5 refills | Status: DC

## 2020-02-28 NOTE — Progress Notes (Signed)
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 Florene Glen [facility]   11/21/2015 -  Chemotherapy   Hydrea 1000 mg and Allopurinol 300 mg daily (due to high uric acid level)   11/21/2015 Tumor Marker   BCR P210 Ratio of Fusion to Control, Blood 0.8941    P210 IS % Ratio, Blood 85.834%     01/02/2016 - 10/23/2016 Chemotherapy   Tasigna   01/08/2016 Miscellaneous   EKG- QT/QTc: 382/440 ms   01/16/2016 Miscellaneous   EKG- QT/QTc: 364/419   03/30/2016 Tumor Marker   b2a2 transcript: 0.317 b3a2 transcript: 0.194   10/23/2016 - 11/05/2016 Hospital Admission   large Left Pleural Effusion and Pericardial Effusion that is now s/p VATS with Drainage, and Drainage of Pericardial Fluid with Window and Biopsy of Pericardium   12/06/2016 -  Chemotherapy   Tasigna restarted    05/24/2017 Adverse Reaction   Prolonged QTc on EKG; likely d/t chemo. Tasigna ON HOLD until next visit.    06/10/2017 -  Chemotherapy   Resumed Tasigna   07/13/2017 Miscellaneous   Evaluated by Cardiology for prolonged QTc. Per Dr. Bronson Ing, patient's QT intervals are not prolonged to the point of potentially promoting a ventricular arrhythmia. He thinks she can continue on current medical therapy for her CML with serial ECG monitoring.   12/27/2017 Miscellaneous   EKG: QT/QTc 398/435       INTERVAL HISTORY:  Andrea Martinez 74 y.o. female seen for follow-up of chronic myeloid leukemia.  She is taking Nilotinib on a regular basis without missing doses.  Appetite is 75%.  Energy levels are low.  Complains of  left knee pain which has been worse since she had right knee replacement surgery.  She has right knee soreness but denies any pain.  She is not on any pain medication at this time.  She is having a surgery done next week.  REVIEW OF SYSTEMS:  Review of Systems  Constitutional: Positive for fatigue.       Day sweats, night sweats  Musculoskeletal: Positive for arthralgias.  Neurological: Positive for headaches.  Psychiatric/Behavioral: Positive for sleep disturbance.  All other systems reviewed and are negative.    PAST MEDICAL/SURGICAL HISTORY:  Past Medical History:  Diagnosis Date  . Arthritis   . 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 Gastric ulcer 2011   EGD, 5/11  . Hypothyroidism    not on meds, followed by Dr. Elyse Hsu  . Leukocytosis 11/20/2015  . Lipoma    left upper arm  . Pericardial effusion    a. HCAP complicated by pericardial effusion requiring pericardial window 10/2016 and large L pleural effusion requring VATS.  Marland Kitchen Pleural effusion    a. s/p VATS 2017.  Marland Kitchen Pneumonia 10/2016  . PONV (postoperative nausea and vomiting)   . Prolonged QT interval   . Urine incontinence   . UTI (lower urinary tract infection)    frequent   Past Surgical History:  Procedure Laterality Date  . BRAVO [place] STUDY  12/04/2012   Procedure: BRAVO [place];  Surgeon: [name], MD;  [facility];  Service: Endoscopy;  Laterality: N/A;  . CHOLECYSTECTOMY N/A 01/03/2014   Procedure: LAPAROSCOPIC CHOLECYSTECTOMY WITH INTRAOPERATIVE CHOLANGIOGRAM;  Surgeon: Joyice Faster. Cornett, MD;  Location: [place];  Service: General;  Laterality: N/A;  . COLONOSCOPY  01/2004   DR [facility], few small tics  . ESOPHAGOGASTRODUODENOSCOPY  02/2010   gastric ulcers  . ESOPHAGOGASTRODUODENOSCOPY  12/04/2012   [id number] gastritis (inflammation) was found in the gastric antrum; multiple biopsies The duodenal mucosa showed no abnormalities in the bulb  and second portion of the duodenum  . JOINT REPLACEMENT Right 10/22/2019  . KYPHOPLASTY N/A 02/14/2019   Procedure: KYPHOPLASTY T12 and L1;  Surgeon: Melina Schools, MD;  Location: [place];  Service: Orthopedics;  Laterality: N/A;  120 mins  . LIPOMA EXCISION  08/02/2011   left shoulder  . NOSE SURGERY    . PARTIAL HYSTERECTOMY     vaginal at age 45 years of age  . TOE DEBRIDEMENT Left 1962   lt great toe  . TOTAL KNEE ARTHROPLASTY Right 10/22/2019   Procedure: TOTAL KNEE ARTHROPLASTY;  Surgeon: Gaynelle Arabian, MD;  Location: [facility];  Service: Orthopedics;  Laterality: Right;  44min  . VIDEO ASSISTED THORACOSCOPY (VATS)/EMPYEMA Left 10/25/2016   Procedure: VIDEO ASSISTED THORACOSCOPY (VATS), BRONCH,DRAINAGE OF PLEURAL EFFUSION,PERICARDIAL WINDOW WITH DRAINAGE OF PERICARDIAL FLUID, TEE;  Surgeon: [name], MD;  Location: [place];  Service: Thoracic;  Laterality: Left;  Marland Kitchen VIDEO BRONCHOSCOPY N/A 10/25/2016   Procedure: VIDEO BRONCHOSCOPY;  Surgeon: [name], MD;  Location: [facility];  Service: Thoracic;  Laterality: N/A;     SOCIAL HISTORY:  Social History   Socioeconomic History  . Marital status: Married    Spouse name: Not on file  . Number of children: 2  . Years of education: 12th grade  . Highest education level: Not on file  Occupational History  . Occupation: retired    Fish farm manager: UENPLOYED    Employer: RETIRED  Tobacco Use  . Smoking status: Never Smoker  . Smokeless tobacco: Never Used  Substance and Sexual Activity  . Alcohol use: No    Alcohol/week: 0.0 standard drinks  . Drug use: No  . Sexual activity: Yes    Partners: Male    Birth control/protection: None  Other Topics Concern  . Not on file  Social History Narrative  . Not on file   Social Determinants of Health   Financial Resource Strain:   . Difficulty of Paying Living Expenses:   Food Insecurity:   . Worried About Charity fundraiser in the Last Year:   . Arboriculturist in the Last  Year:   Transportation Needs:   . Film/video editor (Medical):   Marland Kitchen Lack of Transportation (Non-Medical):   Physical Activity:   . Days of Exercise per Week:   . Minutes of Exercise per Session:   Stress:   . Feeling of Stress :   Social Connections:   . Frequency of Communication with Friends and Family:   . Frequency of Social Gatherings with Friends and Family:   . Attends Religious Services:   . Active Member of Clubs or Organizations:   . Attends Archivist Meetings:   Marland Kitchen Marital Status:   Intimate Partner Violence:   . Fear of Current or Ex-Partner:   . Emotionally Abused:   Marland Kitchen Physically Abused:   . Sexually Abused:     FAMILY HISTORY:  Family History  Problem Relation Age of Onset  .  Lung cancer Father   . COPD Mother   . Colon cancer Neg Hx   . Anesthesia problems Neg Hx   . Hypotension Neg Hx   . Malignant hyperthermia Neg Hx   . Pseudochol deficiency Neg Hx     CURRENT MEDICATIONS:  Outpatient Encounter Medications as of 02/28/2020  Medication Sig  . acetaminophen (TYLENOL) 500 MG tablet Take 1,000 mg by mouth every 6 (six) hours as needed for mild pain or moderate pain.   . cetirizine (ZYRTEC) 10 MG tablet Take 10 mg by mouth at bedtime.   . Cholecalciferol (VITAMIN D3) 50 MCG (2000 UT) TABS Take 2,000 mg by mouth daily.   Marland Kitchen escitalopram (LEXAPRO) 20 MG tablet Take 1.5 tablets (30 mg total) by mouth daily.  . ferrous sulfate 325 (65 FE) MG tablet ferrous sulfate 325 mg (65 mg iron) tablet  1 tablet po bid x 6 days  . Lactobacillus-Inulin (PROBIOTIC DIGESTIVE SUPPORT PO) Take 2 tablets by mouth at bedtime. Gummie  . lactose free nutrition (BOOST) LIQD Take 237 mLs by mouth daily.  Marland Kitchen levothyroxine (SYNTHROID) 125 MCG tablet TAKE 1 TABLET (125 MCG TOTAL) BY MOUTH DAILY BEFORE BREAKFAST.  . Multiple Vitamins-Minerals (CENTRUM SILVER 50+WOMEN) TABS Take 1 tablet by mouth daily.  . nilotinib (TASIGNA) 150 MG capsule Take 2 capsules (300 mg total) by  mouth every 12 (twelve) hours. (Patient taking differently: Take 300 mg by mouth every 12 (twelve) hours. )  . vitamin B-12 (CYANOCOBALAMIN) 1000 MCG tablet Take 1,000 mcg by mouth daily.  . Vitamin E 180 MG CAPS Take 360 mg by mouth daily.    No facility-administered encounter medications on file as of 02/28/2020.    ALLERGIES:  Allergies  Allergen Reactions  . Codeine Nausea Only and Other (See Comments)    Headache  . Sulfonamide Derivatives Nausea Only and Other (See Comments)    Headache  . Esomeprazole Magnesium Nausea Only  . Fluconazole Rash    Redness and blistering on left thigh     PHYSICAL EXAM:  ECOG Performance status: 2   Vitals:   02/28/20 1054  BP: (!) 127/49  Pulse: 74  Resp: 16  Temp: (!) 96.4 F (35.8 C)  SpO2: 99%   Filed Weights   02/28/20 1054  Weight: 222 lb 4.8 oz (100.8 kg)    Physical Exam Constitutional:      Appearance: Normal appearance. She is obese.  HENT:     Head: Normocephalic.     Nose: Nose normal.     Mouth/Throat:     Mouth: Mucous membranes are moist.     Pharynx: Oropharynx is clear.  Eyes:     Pupils: Pupils are equal, round, and reactive to light.  Cardiovascular:     Rate and Rhythm: Normal rate and regular rhythm.  Pulmonary:     Effort: Pulmonary effort is normal.     Breath sounds: Normal breath sounds.  Abdominal:     General: Bowel sounds are normal.     Palpations: Abdomen is soft.  Musculoskeletal:     Cervical back: Normal range of motion.     Right lower leg: Edema present.     Left lower leg: Edema present.  Skin:    General: Skin is warm and dry.  Neurological:     General: No focal deficit present.     Mental Status: She is oriented to person, place, and time. Mental status is at baseline.  Psychiatric:        Mood  and Affect: Mood normal.        Behavior: Behavior normal.        Thought Content: Thought content normal.        Judgment: Judgment normal.      LABORATORY DATA:  I have  reviewed the labs as listed.  CBC    Component Value Date/Time   WBC 12.1 (H) 02/22/2020 1440   RBC 4.30 02/22/2020 1440   HGB 11.6 (L) 02/22/2020 1440   HCT 38.5 02/22/2020 1440   PLT 314 02/22/2020 1440   MCV 89.5 02/22/2020 1440   MCH 27.0 02/22/2020 1440   MCHC 30.1 02/22/2020 1440   RDW 14.7 02/22/2020 1440   LYMPHSABS 1.7 02/21/2020 0933   MONOABS 0.8 02/21/2020 0933   EOSABS 0.2 02/21/2020 0933   BASOSABS 0.0 02/21/2020 0933   CMP Latest Ref Rng & Units 02/22/2020 02/21/2020 11/22/2019  Glucose 70 - 99 mg/dL 103(H) 126(H) 115(H)  BUN 8 - 23 mg/dL 22 23 16   Creatinine 0.44 - 1.00 mg/dL 0.85 0.95 0.94  Sodium 135 - 145 mmol/L 141 141 139  Potassium 3.5 - 5.1 mmol/L 4.2 4.4 4.5  Chloride 98 - 111 mmol/L 106 106 103  CO2 22 - 32 mmol/L 25 25 23   Calcium 8.9 - 10.3 mg/dL 9.2 9.3 9.0  Total Protein 6.5 - 8.1 g/dL 7.9 8.2(H) 7.6  Total Bilirubin 0.3 - 1.2 mg/dL 0.7 0.7 0.6  Alkaline Phos 38 - 126 U/L 150(H) 160(H) 149(H)  AST 15 - 41 U/L 25 27 29   ALT 0 - 44 U/L 17 18 19        DIAGNOSTIC IMAGING:  I have independently reviewed scans.    ASSESSMENT & PLAN:   CML (chronic myeloid leukemia) ([place]) 1.  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 -She developed left pleural effusion and pericardial effusion requiring pericardial window 10/25/2016: Fluid cytology negative. -She was started back on neratinib 300 mg every 12 hours on 12/06/2016. -She is continuing to tolerate Neurontin very well. -We reviewed her recent lab from 02/22/2020.  White count is 12.1. -BCR/ABL by quantitative PCR is undetectable.  LFTs are within normal limits. -We will continue neratinib at the same dose at this time.  I plan to see her back in 3 months for follow-up with repeat labs.  2.  Left knee pain: -She had right knee replacement done.  Since then her left knee has been hurting.  She is scheduled for surgery next Monday.  3.  Daytime sweats: -She had a long history of  daytime sweats for the past 2 years. -JAK2 V6 73F mutation testing and reflex testing was negative for leukocytosis. -CT of the chest from 06/14/2019 did not show any evidence of malignancy.  Hepatic morphology suspicious for cirrhosis. -CT AP on 12/16/2008 shows normal-sized spleen with no adenopathy.      Orders placed this encounter:  Orders Placed This Encounter  Procedures  . CBC with Differential/Platelet  . Comprehensive metabolic panel  . Lactate dehydrogenase  . BCR-ABL1, CML/ALL, PCR, QUANT      Derek Jack, MD [place] [phone]

## 2020-02-28 NOTE — Patient Instructions (Addendum)
[place] at [facility] Discharge Instructions  You were seen today by Dr. Delton Coombes. He went over your recent lab results. Good luck with your upcoming surgery, we hope you have a quick recovery! He will see you back in 3 months for labs and follow up.   Thank you for choosing [place] at [facility] to provide your oncology and hematology care.  To afford each patient quality time with our provider, please arrive at least 15 minutes before your scheduled appointment time.   If you have a lab appointment with the [facility] please come in thru the  Main Entrance and check in at the main information desk  You need to re-schedule your appointment should you arrive 10 or more minutes late.  We strive to give you quality time with our providers, and arriving late affects you and other patients whose appointments are after yours.  Also, if you no show three or more times for appointments you may be dismissed from the clinic at the providers discretion.     Again, thank you for choosing [facility].  Our hope is that these requests will decrease the amount of time that you wait before being seen by our physicians.       _____________________________________________________________  Should you have questions after your visit to [facility], please contact our office at [phone] between the hours of 8:00 a.m. and 4:30 p.m.  Voicemails left after 4:00 p.m. will not be returned until the following business day.  For prescription refill requests, have your pharmacy contact our office and allow 72 hours.    Cancer Center Support Programs:   > Cancer Support Group  2nd Tuesday of the month 1pm-2pm, Journey Room

## 2020-02-28 NOTE — Assessment & Plan Note (Signed)
1.  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 -She developed left pleural effusion and pericardial effusion requiring pericardial window 10/25/2016: Fluid cytology negative. -She was started back on neratinib 300 mg every 12 hours on 12/06/2016. -She is continuing to tolerate Neurontin very well. -We reviewed her recent lab from 02/22/2020.  White count is 12.1. -BCR/ABL by quantitative PCR is undetectable.  LFTs are within normal limits. -We will continue neratinib at the same dose at this time.  I plan to see her back in 3 months for follow-up with repeat labs.  2.  Left knee pain: -She had right knee replacement done.  Since then her left knee has been hurting.  She is scheduled for surgery next Monday.  3.  Daytime sweats: -She had a long history of daytime sweats for the past 2 years. -JAK2 V6 90F mutation testing and reflex testing was negative for leukocytosis. -CT of the chest from 06/14/2019 did not show any evidence of malignancy.  Hepatic morphology suspicious for cirrhosis. -CT AP on 12/16/2008 shows normal-sized spleen with no adenopathy.

## 2020-02-28 NOTE — Telephone Encounter (Signed)
FYI

## 2020-02-29 LAB — SARS CORONAVIRUS 2 (TAT 6-24 HRS): SARS Coronavirus 2: NEGATIVE

## 2020-03-02 MED ORDER — BUPIVACAINE LIPOSOME 1.3 % IJ SUSP
20.0000 mL | INTRAMUSCULAR | Status: DC
  Filled 2020-03-02: qty 20

## 2020-03-03 ENCOUNTER — Ambulatory Visit (HOSPITAL_COMMUNITY): Payer: Medicare Other | Admitting: Physician Assistant

## 2020-03-03 ENCOUNTER — Other Ambulatory Visit: Payer: Self-pay

## 2020-03-03 ENCOUNTER — Ambulatory Visit (HOSPITAL_COMMUNITY)
Admission: RE | Admit: 2020-03-03 | Discharge: 2020-03-04 | Disposition: A | Discharge status: Home / Self Care | Payer: Medicare Other | Attending: Orthopedic Surgery | Admitting: Orthopedic Surgery

## 2020-03-03 ENCOUNTER — Ambulatory Visit (HOSPITAL_COMMUNITY): Payer: Medicare Other | Admitting: Certified Registered Nurse Anesthetist

## 2020-03-03 ENCOUNTER — Encounter (HOSPITAL_COMMUNITY): Payer: Self-pay | Admitting: Orthopedic Surgery

## 2020-03-03 ENCOUNTER — Encounter (HOSPITAL_COMMUNITY)
Admission: RE | Disposition: A | Discharge status: Home / Self Care | Payer: Self-pay | Source: Home / Self Care | Attending: Orthopedic Surgery

## 2020-03-03 DIAGNOSIS — M1712 Unilateral primary osteoarthritis, left knee: Secondary | ICD-10-CM | POA: Diagnosis not present

## 2020-03-03 DIAGNOSIS — C9210 Chronic myeloid leukemia, BCR/ABL-positive, not having achieved remission: Secondary | ICD-10-CM | POA: Diagnosis not present

## 2020-03-03 DIAGNOSIS — E039 Hypothyroidism, unspecified: Secondary | ICD-10-CM | POA: Diagnosis not present

## 2020-03-03 DIAGNOSIS — Z96651 Presence of right artificial knee joint: Secondary | ICD-10-CM | POA: Insufficient documentation

## 2020-03-03 DIAGNOSIS — G8918 Other acute postprocedural pain: Secondary | ICD-10-CM | POA: Diagnosis not present

## 2020-03-03 DIAGNOSIS — Z7901 Long term (current) use of anticoagulants: Secondary | ICD-10-CM | POA: Insufficient documentation

## 2020-03-03 DIAGNOSIS — Z882 Allergy status to sulfonamides status: Secondary | ICD-10-CM | POA: Diagnosis not present

## 2020-03-03 DIAGNOSIS — Z79890 Hormone replacement therapy (postmenopausal): Secondary | ICD-10-CM | POA: Insufficient documentation

## 2020-03-03 DIAGNOSIS — D509 Iron deficiency anemia, unspecified: Secondary | ICD-10-CM | POA: Diagnosis not present

## 2020-03-03 HISTORY — PX: TOTAL KNEE ARTHROPLASTY: SHX125

## 2020-03-03 LAB — TYPE AND SCREEN
ABO/RH(D): A POS
Antibody Screen: NEGATIVE

## 2020-03-03 SURGERY — ARTHROPLASTY, KNEE, TOTAL
Anesthesia: Spinal | Site: Knee | Laterality: Left

## 2020-03-03 MED ORDER — METHOCARBAMOL 500 MG IVPB - SIMPLE MED
INTRAVENOUS | Status: AC
  Filled 2020-03-03: qty 50

## 2020-03-03 MED ORDER — FENTANYL CITRATE (PF) 100 MCG/2ML IJ SOLN
INTRAMUSCULAR | Status: AC
  Administered 2020-03-03: 50 ug via INTRAVENOUS
  Filled 2020-03-03: qty 2

## 2020-03-03 MED ORDER — POVIDONE-IODINE 10 % EX SWAB: 2.0000 "application " | Freq: Once | CUTANEOUS | Status: DC

## 2020-03-03 MED ORDER — SODIUM CHLORIDE 0.9 % IV SOLN: INTRAVENOUS | Status: DC

## 2020-03-03 MED ORDER — BISACODYL 10 MG RE SUPP: 10.0000 mg | Freq: Every day | RECTAL | Status: DC | PRN

## 2020-03-03 MED ORDER — ACETAMINOPHEN 10 MG/ML IV SOLN: 1000.0000 mg | Freq: Once | INTRAVENOUS | Status: DC | PRN

## 2020-03-03 MED ORDER — STERILE WATER FOR IRRIGATION IR SOLN
Status: DC | PRN
  Administered 2020-03-03: 2000 mL

## 2020-03-03 MED ORDER — MORPHINE SULFATE (PF) 2 MG/ML IV SOLN: 0.5000 mg | INTRAVENOUS | Status: DC | PRN

## 2020-03-03 MED ORDER — POLYETHYLENE GLYCOL 3350 17 G PO PACK: 17.0000 g | PACK | Freq: Every day | ORAL | Status: DC | PRN

## 2020-03-03 MED ORDER — ONDANSETRON HCL 4 MG/2ML IJ SOLN
INTRAMUSCULAR | Status: DC | PRN
  Administered 2020-03-03: 4 mg via INTRAVENOUS

## 2020-03-03 MED ORDER — SODIUM CHLORIDE (PF) 0.9 % IJ SOLN
INTRAMUSCULAR | Status: DC | PRN
  Administered 2020-03-03: 60 mL

## 2020-03-03 MED ORDER — DIPHENHYDRAMINE HCL 12.5 MG/5ML PO ELIX: 12.5000 mg | ORAL_SOLUTION | ORAL | Status: DC | PRN

## 2020-03-03 MED ORDER — FENTANYL CITRATE (PF) 100 MCG/2ML IJ SOLN
25.0000 ug | INTRAMUSCULAR | Status: DC | PRN
  Administered 2020-03-03 (×2): 50 ug via INTRAVENOUS

## 2020-03-03 MED ORDER — MENTHOL 3 MG MT LOZG: 1.0000 | LOZENGE | OROMUCOSAL | Status: DC | PRN

## 2020-03-03 MED ORDER — GABAPENTIN 300 MG PO CAPS
300.0000 mg | ORAL_CAPSULE | Freq: Three times a day (TID) | ORAL | Status: DC
  Administered 2020-03-03 – 2020-03-04 (×3): 300 mg via ORAL
  Filled 2020-03-03 (×3): qty 1

## 2020-03-03 MED ORDER — METHOCARBAMOL 500 MG IVPB - SIMPLE MED
500.0000 mg | Freq: Four times a day (QID) | INTRAVENOUS | Status: DC | PRN
  Administered 2020-03-03: 500 mg via INTRAVENOUS
  Filled 2020-03-03: qty 50

## 2020-03-03 MED ORDER — HYDROMORPHONE HCL 1 MG/ML IJ SOLN: 0.2500 mg | INTRAMUSCULAR | Status: DC | PRN

## 2020-03-03 MED ORDER — FLEET ENEMA 7-19 GM/118ML RE ENEM: 1.0000 | ENEMA | Freq: Once | RECTAL | Status: DC | PRN

## 2020-03-03 MED ORDER — EPHEDRINE 5 MG/ML INJ
INTRAVENOUS | Status: AC
  Filled 2020-03-03: qty 10

## 2020-03-03 MED ORDER — METOCLOPRAMIDE HCL 5 MG PO TABS: 5.0000 mg | ORAL_TABLET | Freq: Three times a day (TID) | ORAL | Status: DC | PRN

## 2020-03-03 MED ORDER — EPHEDRINE SULFATE-NACL 50-0.9 MG/10ML-% IV SOSY
PREFILLED_SYRINGE | INTRAVENOUS | Status: DC | PRN
  Administered 2020-03-03: 5 mg via INTRAVENOUS
  Administered 2020-03-03 (×2): 10 mg via INTRAVENOUS
  Administered 2020-03-03: 5 mg via INTRAVENOUS
  Administered 2020-03-03: 10 mg via INTRAVENOUS

## 2020-03-03 MED ORDER — PROPOFOL 10 MG/ML IV BOLUS
INTRAVENOUS | Status: DC | PRN
  Administered 2020-03-03 (×2): 20 mg via INTRAVENOUS
  Administered 2020-03-03: 40 mg via INTRAVENOUS
  Administered 2020-03-03 (×2): 20 mg via INTRAVENOUS

## 2020-03-03 MED ORDER — FENTANYL CITRATE (PF) 100 MCG/2ML IJ SOLN
INTRAMUSCULAR | Status: AC
  Filled 2020-03-03: qty 2

## 2020-03-03 MED ORDER — SODIUM CHLORIDE 0.9 % IR SOLN
Status: DC | PRN
  Administered 2020-03-03: 1000 mL

## 2020-03-03 MED ORDER — ROPIVACAINE HCL 5 MG/ML IJ SOLN
INTRAMUSCULAR | Status: DC | PRN
  Administered 2020-03-03 (×3): 5 mL via PERINEURAL

## 2020-03-03 MED ORDER — OXYCODONE HCL 5 MG PO TABS: 5.0000 mg | ORAL_TABLET | ORAL | Status: DC | PRN

## 2020-03-03 MED ORDER — PROPOFOL 500 MG/50ML IV EMUL
INTRAVENOUS | Status: AC
  Filled 2020-03-03: qty 50

## 2020-03-03 MED ORDER — BUPIVACAINE LIPOSOME 1.3 % IJ SUSP
INTRAMUSCULAR | Status: DC | PRN
  Administered 2020-03-03: 20 mL

## 2020-03-03 MED ORDER — METHOCARBAMOL 500 MG PO TABS
500.0000 mg | ORAL_TABLET | Freq: Four times a day (QID) | ORAL | Status: DC | PRN
  Administered 2020-03-03 – 2020-03-04 (×2): 500 mg via ORAL
  Filled 2020-03-03 (×3): qty 1

## 2020-03-03 MED ORDER — PHENYLEPHRINE 40 MCG/ML (10ML) SYRINGE FOR IV PUSH (FOR BLOOD PRESSURE SUPPORT)
PREFILLED_SYRINGE | INTRAVENOUS | Status: AC
  Filled 2020-03-03: qty 20

## 2020-03-03 MED ORDER — TRANEXAMIC ACID-NACL 1000-0.7 MG/100ML-% IV SOLN
1000.0000 mg | INTRAVENOUS | Status: AC
  Administered 2020-03-03: 1000 mg via INTRAVENOUS
  Filled 2020-03-03: qty 100

## 2020-03-03 MED ORDER — BUPIVACAINE IN DEXTROSE 0.75-8.25 % IT SOLN
INTRATHECAL | Status: DC | PRN
  Administered 2020-03-03: 1.6 mL via INTRATHECAL

## 2020-03-03 MED ORDER — FENTANYL CITRATE (PF) 100 MCG/2ML IJ SOLN: 50.0000 ug | Freq: Once | INTRAMUSCULAR | Status: AC

## 2020-03-03 MED ORDER — CEFAZOLIN SODIUM-DEXTROSE 2-4 GM/100ML-% IV SOLN
2.0000 g | Freq: Four times a day (QID) | INTRAVENOUS | Status: AC
  Administered 2020-03-03 (×2): 2 g via INTRAVENOUS
  Filled 2020-03-03 (×2): qty 100

## 2020-03-03 MED ORDER — FENTANYL CITRATE (PF) 100 MCG/2ML IJ SOLN
50.0000 ug | Freq: Once | INTRAMUSCULAR | Status: AC
  Administered 2020-03-03: 100 ug via INTRAVENOUS
  Filled 2020-03-03: qty 2

## 2020-03-03 MED ORDER — NILOTINIB HCL 150 MG PO CAPS: 300.0000 mg | ORAL_CAPSULE | Freq: Two times a day (BID) | ORAL | Status: DC

## 2020-03-03 MED ORDER — SODIUM CHLORIDE (PF) 0.9 % IJ SOLN
INTRAMUSCULAR | Status: AC
  Filled 2020-03-03: qty 50

## 2020-03-03 MED ORDER — PROMETHAZINE HCL 25 MG/ML IJ SOLN: 6.2500 mg | INTRAMUSCULAR | Status: DC | PRN

## 2020-03-03 MED ORDER — MIDAZOLAM HCL 2 MG/2ML IJ SOLN
1.0000 mg | Freq: Once | INTRAMUSCULAR | Status: AC
  Administered 2020-03-03: 2 mg via INTRAVENOUS
  Filled 2020-03-03: qty 2

## 2020-03-03 MED ORDER — DEXAMETHASONE SODIUM PHOSPHATE 10 MG/ML IJ SOLN
8.0000 mg | Freq: Once | INTRAMUSCULAR | Status: AC
  Administered 2020-03-03: 8 mg via INTRAVENOUS

## 2020-03-03 MED ORDER — LEVOTHYROXINE SODIUM 125 MCG PO TABS
125.0000 ug | ORAL_TABLET | Freq: Every day | ORAL | Status: DC
  Administered 2020-03-04: 125 ug via ORAL
  Filled 2020-03-03: qty 1

## 2020-03-03 MED ORDER — LORATADINE 10 MG PO TABS
10.0000 mg | ORAL_TABLET | Freq: Every day | ORAL | Status: DC
  Administered 2020-03-03 – 2020-03-04 (×2): 10 mg via ORAL
  Filled 2020-03-03 (×2): qty 1

## 2020-03-03 MED ORDER — ROPIVACAINE HCL 7.5 MG/ML IJ SOLN
INTRAMUSCULAR | Status: DC | PRN
  Administered 2020-03-03 (×4): 5 mL via PERINEURAL

## 2020-03-03 MED ORDER — PHENYLEPHRINE 40 MCG/ML (10ML) SYRINGE FOR IV PUSH (FOR BLOOD PRESSURE SUPPORT)
PREFILLED_SYRINGE | INTRAVENOUS | Status: DC | PRN
  Administered 2020-03-03 (×2): 80 ug via INTRAVENOUS
  Administered 2020-03-03: 40 ug via INTRAVENOUS
  Administered 2020-03-03 (×2): 80 ug via INTRAVENOUS
  Administered 2020-03-03: 40 ug via INTRAVENOUS
  Administered 2020-03-03: 80 ug via INTRAVENOUS

## 2020-03-03 MED ORDER — HYDROMORPHONE HCL 1 MG/ML IJ SOLN
INTRAMUSCULAR | Status: AC
  Filled 2020-03-03: qty 1

## 2020-03-03 MED ORDER — OXYCODONE HCL 5 MG PO TABS
10.0000 mg | ORAL_TABLET | ORAL | Status: DC | PRN
  Administered 2020-03-03 – 2020-03-04 (×4): 10 mg via ORAL
  Filled 2020-03-03 (×4): qty 2

## 2020-03-03 MED ORDER — CEFAZOLIN SODIUM-DEXTROSE 2-4 GM/100ML-% IV SOLN
2.0000 g | INTRAVENOUS | Status: AC
  Administered 2020-03-03: 2 g via INTRAVENOUS
  Filled 2020-03-03: qty 100

## 2020-03-03 MED ORDER — DEXAMETHASONE SODIUM PHOSPHATE 10 MG/ML IJ SOLN
10.0000 mg | Freq: Once | INTRAMUSCULAR | Status: AC
  Administered 2020-03-04: 10 mg via INTRAVENOUS
  Filled 2020-03-03: qty 1

## 2020-03-03 MED ORDER — ONDANSETRON HCL 4 MG/2ML IJ SOLN: 4.0000 mg | Freq: Four times a day (QID) | INTRAMUSCULAR | Status: DC | PRN

## 2020-03-03 MED ORDER — CHLORHEXIDINE GLUCONATE 4 % EX LIQD: 60.0000 mL | Freq: Once | CUTANEOUS | Status: DC

## 2020-03-03 MED ORDER — 0.9 % SODIUM CHLORIDE (POUR BTL) OPTIME
TOPICAL | Status: DC | PRN
  Administered 2020-03-03: 1000 mL

## 2020-03-03 MED ORDER — RIVAROXABAN 10 MG PO TABS
10.0000 mg | ORAL_TABLET | Freq: Every day | ORAL | Status: DC
  Administered 2020-03-04: 10 mg via ORAL
  Filled 2020-03-03: qty 1

## 2020-03-03 MED ORDER — PHENYLEPHRINE HCL (PRESSORS) 10 MG/ML IV SOLN
INTRAVENOUS | Status: AC
  Filled 2020-03-03: qty 1

## 2020-03-03 MED ORDER — PROPOFOL 500 MG/50ML IV EMUL
INTRAVENOUS | Status: DC | PRN
  Administered 2020-03-03: 35 ug/kg/min via INTRAVENOUS

## 2020-03-03 MED ORDER — ACETAMINOPHEN 10 MG/ML IV SOLN
1000.0000 mg | Freq: Four times a day (QID) | INTRAVENOUS | Status: DC
  Administered 2020-03-03: 1000 mg via INTRAVENOUS
  Filled 2020-03-03: qty 100

## 2020-03-03 MED ORDER — METOCLOPRAMIDE HCL 5 MG/ML IJ SOLN: 5.0000 mg | Freq: Three times a day (TID) | INTRAMUSCULAR | Status: DC | PRN

## 2020-03-03 MED ORDER — ONDANSETRON HCL 4 MG PO TABS: 4.0000 mg | ORAL_TABLET | Freq: Four times a day (QID) | ORAL | Status: DC | PRN

## 2020-03-03 MED ORDER — ACETAMINOPHEN 500 MG PO TABS
1000.0000 mg | ORAL_TABLET | Freq: Four times a day (QID) | ORAL | Status: DC
  Administered 2020-03-03 – 2020-03-04 (×3): 1000 mg via ORAL
  Filled 2020-03-03 (×4): qty 2

## 2020-03-03 MED ORDER — PHENOL 1.4 % MT LIQD: 1.0000 | OROMUCOSAL | Status: DC | PRN

## 2020-03-03 MED ORDER — DEXAMETHASONE SODIUM PHOSPHATE 10 MG/ML IJ SOLN
INTRAMUSCULAR | Status: AC
  Filled 2020-03-03: qty 1

## 2020-03-03 MED ORDER — LACTATED RINGERS IV SOLN: INTRAVENOUS | Status: DC

## 2020-03-03 MED ORDER — ESCITALOPRAM OXALATE 20 MG PO TABS
30.0000 mg | ORAL_TABLET | Freq: Every day | ORAL | Status: DC
  Administered 2020-03-03 – 2020-03-04 (×2): 30 mg via ORAL
  Filled 2020-03-03 (×2): qty 1

## 2020-03-03 MED ORDER — CLONIDINE HCL (ANALGESIA) 100 MCG/ML EP SOLN
EPIDURAL | Status: DC | PRN
  Administered 2020-03-03: 100 ug

## 2020-03-03 MED ORDER — SODIUM CHLORIDE (PF) 0.9 % IJ SOLN
INTRAMUSCULAR | Status: AC
  Filled 2020-03-03: qty 10

## 2020-03-03 MED ORDER — MEPERIDINE HCL 50 MG/ML IJ SOLN: 6.2500 mg | INTRAMUSCULAR | Status: DC | PRN

## 2020-03-03 MED ORDER — ONDANSETRON HCL 4 MG/2ML IJ SOLN
INTRAMUSCULAR | Status: AC
  Filled 2020-03-03: qty 2

## 2020-03-03 MED ORDER — DOCUSATE SODIUM 100 MG PO CAPS
100.0000 mg | ORAL_CAPSULE | Freq: Two times a day (BID) | ORAL | Status: DC
  Administered 2020-03-03 – 2020-03-04 (×2): 100 mg via ORAL
  Filled 2020-03-03 (×2): qty 1

## 2020-03-03 SURGICAL SUPPLY — 62 items
BAG SPEC THK2 15X12 ZIP CLS (MISCELLANEOUS) ×1
BAG ZIPLOCK 12X15 (MISCELLANEOUS) ×3 IMPLANT
BLADE SAG 18X100X1.27 (BLADE) ×3 IMPLANT
BLADE SAW SGTL 11.0X1.19X90.0M (BLADE) ×3 IMPLANT
BLADE SURG SZ10 CARB STEEL (BLADE) ×6 IMPLANT
BNDG ELASTIC 6X5.8 VLCR STR LF (GAUZE/BANDAGES/DRESSINGS) ×3 IMPLANT
BOWL SMART MIX CTS (DISPOSABLE) ×3 IMPLANT
CEMENT HV SMART SET (Cement) ×6 IMPLANT
CEMENT TIBIA MBT SIZE 2.5 (Knees) ×1 IMPLANT
CLOSURE STERI-STRIP 1/2X4 (GAUZE/BANDAGES/DRESSINGS) ×1
CLOSURE WOUND 1/2 X4 (GAUZE/BANDAGES/DRESSINGS) ×2
CLSR STERI-STRIP ANTIMIC 1/2X4 (GAUZE/BANDAGES/DRESSINGS) ×2 IMPLANT
COVER SURGICAL LIGHT HANDLE (MISCELLANEOUS) ×3 IMPLANT
COVER WAND RF STERILE (DRAPES) ×3 IMPLANT
CUFF TOURN SGL QUICK 34 (TOURNIQUET CUFF) ×3
CUFF TRNQT CYL 34X4.125X (TOURNIQUET CUFF) ×1 IMPLANT
DECANTER SPIKE VIAL GLASS SM (MISCELLANEOUS) ×3 IMPLANT
DRAPE U-SHAPE 47X51 STRL (DRAPES) ×3 IMPLANT
DRESSING AQUACEL AG SP 3.5X10 (GAUZE/BANDAGES/DRESSINGS) ×1 IMPLANT
DRSG AQUACEL AG ADV 3.5X10 (GAUZE/BANDAGES/DRESSINGS) ×3 IMPLANT
DRSG AQUACEL AG SP 3.5X10 (GAUZE/BANDAGES/DRESSINGS) ×3
DURAPREP 26ML APPLICATOR (WOUND CARE) ×3 IMPLANT
ELECT REM PT RETURN 15FT ADLT (MISCELLANEOUS) ×3 IMPLANT
EVACUATOR 1/8 PVC DRAIN (DRAIN) IMPLANT
GAUZE SPONGE 2X2 8PLY STRL LF (GAUZE/BANDAGES/DRESSINGS) ×1 IMPLANT
GLOVE BIO SURGEON STRL SZ7 (GLOVE) ×3 IMPLANT
GLOVE BIO SURGEON STRL SZ8 (GLOVE) ×3 IMPLANT
GLOVE BIOGEL PI IND STRL 7.0 (GLOVE) ×1 IMPLANT
GLOVE BIOGEL PI IND STRL 8 (GLOVE) ×1 IMPLANT
GLOVE BIOGEL PI INDICATOR 7.0 (GLOVE) ×2
GLOVE BIOGEL PI INDICATOR 8 (GLOVE) ×2
GOWN STRL REUS W/TWL LRG LVL3 (GOWN DISPOSABLE) ×6 IMPLANT
HANDPIECE INTERPULSE COAX TIP (DISPOSABLE) ×3
HOLDER FOLEY CATH W/STRAP (MISCELLANEOUS) ×3 IMPLANT
IMMOBILIZER KNEE 20 (SOFTGOODS) ×3
IMMOBILIZER KNEE 20 THIGH 36 (SOFTGOODS) ×1 IMPLANT
IMPL FEMUR SIGMA LT PS SZ 3 (Knees) ×1 IMPLANT
IMPLANT FEMUR SIGMA LT PS SZ 3 (Knees) ×3 IMPLANT
INSERT TIBIAL PFC SIG SZ3 10MM (Knees) ×3 IMPLANT
KIT TURNOVER KIT A (KITS) IMPLANT
MANIFOLD NEPTUNE II (INSTRUMENTS) ×3 IMPLANT
NS IRRIG 1000ML POUR BTL (IV SOLUTION) ×3 IMPLANT
PACK TOTAL KNEE CUSTOM (KITS) ×3 IMPLANT
PADDING CAST COTTON 6X4 STRL (CAST SUPPLIES) ×3 IMPLANT
PATELLA DOME PFC 35MM (Knees) ×3 IMPLANT
PENCIL SMOKE EVACUATOR (MISCELLANEOUS) ×3 IMPLANT
PIN STEINMAN FIXATION KNEE (PIN) ×3 IMPLANT
PROTECTOR NERVE ULNAR (MISCELLANEOUS) ×3 IMPLANT
SET HNDPC FAN SPRY TIP SCT (DISPOSABLE) ×1 IMPLANT
SPONGE GAUZE 2X2 STER 10/PKG (GAUZE/BANDAGES/DRESSINGS) ×2
STRIP CLOSURE SKIN 1/2X4 (GAUZE/BANDAGES/DRESSINGS) ×4 IMPLANT
SUT MNCRL AB 4-0 PS2 18 (SUTURE) ×3 IMPLANT
SUT STRATAFIX 0 PDS 27 VIOLET (SUTURE) ×3
SUT VIC AB 2-0 CT1 27 (SUTURE) ×9
SUT VIC AB 2-0 CT1 TAPERPNT 27 (SUTURE) ×3 IMPLANT
SUTURE STRATFX 0 PDS 27 VIOLET (SUTURE) ×1 IMPLANT
TIBIA MBT CEMENT SIZE 2.5 (Knees) ×3 IMPLANT
TRAY FOLEY MTR SLVR 14FR STAT (SET/KITS/TRAYS/PACK) ×3 IMPLANT
TRAY FOLEY MTR SLVR 16FR STAT (SET/KITS/TRAYS/PACK) IMPLANT
WATER STERILE IRR 1000ML POUR (IV SOLUTION) ×6 IMPLANT
WRAP KNEE MAXI GEL POST OP (GAUZE/BANDAGES/DRESSINGS) ×3 IMPLANT
YANKAUER SUCT BULB TIP 10FT TU (MISCELLANEOUS) ×3 IMPLANT

## 2020-03-03 NOTE — Anesthesia Postprocedure Evaluation (Signed)
Anesthesia Post Note  Patient: Andrea Martinez  Procedure(s) Performed: TOTAL KNEE ARTHROPLASTY (Left Knee)     Patient location during evaluation: PACU Anesthesia Type: Spinal Level of consciousness: awake and sedated Pain management: pain level controlled Vital Signs Assessment: post-procedure vital signs reviewed and stable Respiratory status: spontaneous breathing Cardiovascular status: stable Postop Assessment: no backache, spinal receding, patient able to bend at knees and no apparent nausea or vomiting Anesthetic complications: no    Last Vitals:  Vitals:   03/03/20 1430 03/03/20 1453  BP: 133/63 (!) 144/61  Pulse: 76 77  Resp: 13 18  Temp: 36.5 C 36.4 C  SpO2: 97% 99%    Last Pain:  Vitals:   03/03/20 1415  TempSrc:   PainSc: Asleep   Pain Goal:    LLE Motor Response: (P) Purposeful movement (03/03/20 1448) LLE Sensation: (P) Decreased (03/03/20 1448) RLE Motor Response: Purposeful movement (03/03/20 1448) RLE Sensation: (P) Decreased (03/03/20 1448) L Sensory Level: (P) L4-Anterior knee, lower leg (03/03/20 1448) R Sensory Level: (P) L4-Anterior knee, lower leg (03/03/20 1448) Epidural/Spinal Function Patient able to flex knees: Yes (03/03/20 1430)  Huston Foley

## 2020-03-03 NOTE — Op Note (Signed)
OPERATIVE REPORT-TOTAL KNEE ARTHROPLASTY   Pre-operative diagnosis- Osteoarthritis  Left knee(s)  Post-operative diagnosis- Osteoarthritis Left knee(s)  Procedure-  Left  Total Knee Arthroplasty  Surgeon- Dione Plover. Therron Sells, MD  Assistant- Theresa Duty, PA-C   Anesthesia-  Adductor canal block and spinal  EBL-50 mL   Drains Hemovac  Tourniquet time-  Total Tourniquet Time Documented: Thigh (Left) - 46 minutes Total: Thigh (Left) - 46 minutes     Complications- None  Condition-PACU - hemodynamically stable.   Brief Clinical Note  Andrea Martinez is a 74 y.o. year old female with end stage OA of her left knee with progressively worsening pain and dysfunction. She has constant pain, with activity and at rest and significant functional deficits with difficulties even with ADLs. She has had extensive non-op management including analgesics, injections of cortisone and viscosupplements, and home exercise program, but remains in significant pain with significant dysfunction. Radiographs show bone on bone arthritis medial and patellofemoral. She presents now for left Total Knee Arthroplasty.    Procedure in detail---   The patient is brought into the operating room and positioned supine on the operating table. After successful administration of  Adductor canal block and spinal,   a tourniquet is placed high on the  Left thigh(s) and the lower extremity is prepped and draped in the usual sterile fashion. Time out is performed by the operating team and then the  Left lower extremity is wrapped in Esmarch, knee flexed and the tourniquet inflated to 300 mmHg.       A midline incision is made with a ten blade through the subcutaneous tissue to the level of the extensor mechanism. A fresh blade is used to make a medial parapatellar arthrotomy. Soft tissue over the proximal medial tibia is subperiosteally elevated to the joint line with a knife and into the semimembranosus bursa with a Cobb  elevator. Soft tissue over the proximal lateral tibia is elevated with attention being paid to avoiding the patellar tendon on the tibial tubercle. The patella is everted, knee flexed 90 degrees and the ACL and PCL are removed. Findings are bone on bone medial and patellofemoral with large global osteophytes.        The drill is used to create a starting hole in the distal femur and the canal is thoroughly irrigated with sterile saline to remove the fatty contents. The 5 degree Left  valgus alignment guide is placed into the femoral canal and the distal femoral cutting block is pinned to remove 10 mm off the distal femur. Resection is made with an oscillating saw.      The tibia is subluxed forward and the menisci are removed. The extramedullary alignment guide is placed referencing proximally at the medial aspect of the tibial tubercle and distally along the second metatarsal axis and tibial crest. The block is pinned to remove 51mm off the more deficient medial  side. Resection is made with an oscillating saw. Size 2.5is the most appropriate size for the tibia and the proximal tibia is prepared with the modular drill and keel punch for that size.      The femoral sizing guide is placed and size 3 is most appropriate. Rotation is marked off the epicondylar axis and confirmed by creating a rectangular flexion gap at 90 degrees. The size 3 cutting block is pinned in this rotation and the anterior, posterior and chamfer cuts are made with the oscillating saw. The intercondylar block is then placed and that cut is made.  Trial size 2.5 tibial component, trial size 3 posterior stabilized femur and a 10  mm posterior stabilized rotating platform insert trial is placed. Full extension is achieved with excellent varus/valgus and anterior/posterior balance throughout full range of motion. The patella is everted and thickness measured to be 22  mm. Free hand resection is taken to 12 mm, a 35 template is placed, lug  holes are drilled, trial patella is placed, and it tracks normally. Osteophytes are removed off the posterior femur with the trial in place. All trials are removed and the cut bone surfaces prepared with pulsatile lavage. Cement is mixed and once ready for implantation, the size 2.5 tibial implant, size  3 posterior stabilized femoral component, and the size 35 patella are cemented in place and the patella is held with the clamp. The trial insert is placed and the knee held in full extension. The Exparel (20 ml mixed with 60 ml saline) is injected into the extensor mechanism, posterior capsule, medial and lateral gutters and subcutaneous tissues.  All extruded cement is removed and once the cement is hard the permanent 10 mm posterior stabilized rotating platform insert is placed into the tibial tray.      The wound is copiously irrigated with saline solution and the extensor mechanism closed over a hemovac drain with #1 V-loc suture. The tourniquet is released for a total tourniquet time of 46  minutes. Flexion against gravity is 140 degrees and the patella tracks normally. Subcutaneous tissue is closed with 2.0 vicryl and subcuticular with running 4.0 Monocryl. The incision is cleaned and dried and steri-strips and a bulky sterile dressing are applied. The limb is placed into a knee immobilizer and the patient is awakened and transported to recovery in stable condition.      Please note that a surgical assistant was a medical necessity for this procedure in order to perform it in a safe and expeditious manner. Surgical assistant was necessary to retract the ligaments and vital neurovascular structures to prevent injury to them and also necessary for proper positioning of the limb to allow for anatomic placement of the prosthesis.   Dione Plover Mililani Murthy, MD    03/03/2020, 1:24 PM

## 2020-03-03 NOTE — Anesthesia Procedure Notes (Signed)
Spinal  Patient location during procedure: OR Start time: 03/03/2020 12:02 PM End time: 03/03/2020 12:03 PM Staffing Performed: anesthesiologist  Anesthesiologist: Lyn Hollingshead, MD Preanesthetic Checklist Completed: patient identified, IV checked, site marked, risks and benefits discussed, surgical consent, monitors and equipment checked, pre-op evaluation and timeout performed Spinal Block Patient position: sitting Prep: DuraPrep and site prepped and draped Patient monitoring: continuous pulse ox and blood pressure Approach: midline Location: L3-4 Injection technique: single-shot Needle Needle type: Pencan  Needle gauge: 24 G Needle length: 10 cm Needle insertion depth: 6 cm Assessment Sensory level: T8

## 2020-03-03 NOTE — Discharge Instructions (Addendum)
Gaynelle Arabian, MD Total Joint Specialist [facility] [address] [place], [place] [address] [phone]  TOTAL KNEE REPLACEMENT POSTOPERATIVE DIRECTIONS    Knee Rehabilitation, Guidelines Following Surgery  Results after knee surgery are often greatly improved when you follow the exercise, range of motion and muscle strengthening exercises prescribed by your doctor. Safety measures are also important to protect the knee from further injury. If any of these exercises cause you to have increased pain or swelling in your knee joint, decrease the amount until you are comfortable again and slowly increase them. If you have problems or questions, call your caregiver or physical therapist for advice.   BLOOD CLOT PREVENTION . Take a 10 mg Xarelto once a day for three weeks following surgery. Then take an 81 mg Aspirin once a day for three weeks. Then discontinue Aspirin. . You may resume your vitamins/supplements upon once you have discontinued the Xarelto. . Do not take any NSAIDs (Advil, Aleve, Ibuprofen, Meloxicam, etc.) until you have discontinued the Xarelto.   HOME CARE INSTRUCTIONS  . Remove items at home which could result in a fall. This includes throw rugs or furniture in walking pathways.  . ICE to the affected knee as much as tolerated. Icing helps control swelling. If the swelling is well controlled you will be more comfortable and rehab easier. Continue to use ice on the knee for pain and swelling from surgery. You may notice swelling that will progress down to the foot and ankle. This is normal after surgery. Elevate the leg when you are not up walking on it.    . Continue to use the breathing machine which will help keep your temperature down. It is common for your temperature to cycle up and down following surgery, especially at night when you are not up moving around and exerting yourself. The breathing machine keeps your lungs expanded and your  temperature down. . Do not place pillow under the operative knee, focus on keeping the knee straight while resting  DIET You may resume your previous home diet once you are discharged from the hospital.  DRESSING / [place] / SHOWERING . Keep your bulky bandage on for 2 days. On the third post-operative day you may remove the Ace bandage and gauze. There is a waterproof adhesive bandage on your skin which will stay in place until your first follow-up appointment. Once you remove this you will not need to place another bandage . You may begin showering 3 days following surgery, but do not submerge the incision under water.  ACTIVITY For the first 5 days, the key is rest and control of pain and swelling . Do your home exercises twice a day starting on post-operative day 3. On the days you go to physical therapy, just do the home exercises once that day. . You should rest, ice and elevate the leg for 50 minutes out of every hour. Get up and walk/stretch for 10 minutes per hour. After 5 days you can increase your activity slowly as tolerated. . Walk with your walker as instructed. Use the walker until you are comfortable transitioning to a cane. Walk with the cane in the opposite hand of the operative leg. You may discontinue the cane once you are comfortable and walking steadily. . Avoid periods of inactivity such as sitting longer than an hour when not asleep. This helps prevent blood clots.  . You may discontinue the knee immobilizer once you are able to perform a straight  leg raise while lying down. . You may resume a sexual relationship in one month or when given the OK by your doctor.  . You may return to work once you are cleared by your doctor.  . Do not drive a car for 6 weeks or until released by your surgeon.  . Do not drive while taking narcotics.  TED HOSE STOCKINGS Wear the elastic stockings on both legs for three weeks following surgery during the day. You may remove them at night  for sleeping.  WEIGHT BEARING Weight bearing as tolerated with assist device (walker, cane, etc) as directed, use it as long as suggested by your surgeon or therapist, typically at least 4-6 weeks.  POSTOPERATIVE CONSTIPATION PROTOCOL Constipation - defined medically as fewer than three stools per week and severe constipation as less than one stool per week.  One of the most common issues patients have following surgery is constipation.  Even if you have a regular bowel pattern at home, your normal regimen is likely to be disrupted due to multiple reasons following surgery.  Combination of anesthesia, postoperative narcotics, change in appetite and fluid intake all can affect your bowels.  In order to avoid complications following surgery, here are some recommendations in order to help you during your recovery period.  . Colace (docusate) - Pick up an over-the-counter form of Colace or another stool softener and take twice a day as long as you are requiring postoperative pain medications.  Take with a full glass of water daily.  If you experience loose stools or diarrhea, hold the colace until you stool forms back up. If your symptoms do not get better within 1 week or if they get worse, check with your doctor. . Dulcolax (bisacodyl) - Pick up over-the-counter and take as directed by the product packaging as needed to assist with the movement of your bowels.  Take with a full glass of water.  Use this product as needed if not relieved by Colace only.  . MiraLax (polyethylene glycol) - Pick up over-the-counter to have on hand. MiraLax is a solution that will increase the amount of water in your bowels to assist with bowel movements.  Take as directed and can mix with a glass of water, juice, soda, coffee, or tea. Take if you go more than two days without a movement. Do not use MiraLax more than once per day. Call your doctor if you are still constipated or irregular after using this medication for 7 days  in a row.  If you continue to have problems with postoperative constipation, please contact the office for further assistance and recommendations.  If you experience "the worst abdominal pain ever" or develop nausea or vomiting, please contact the office immediatly for further recommendations for treatment.  ITCHING If you experience itching with your medications, try taking only a single pain pill, or even half a pain pill at a time.  You can also use Benadryl over the counter for itching or also to help with sleep.   MEDICATIONS See your medication summary on the "After Visit Summary" that the nursing staff will review with you prior to discharge.  You may have some home medications which will be placed on hold until you complete the course of blood thinner medication.  It is important for you to complete the blood thinner medication as prescribed by your surgeon.  Continue your approved medications as instructed at time of discharge.  PRECAUTIONS . If you experience chest pain or shortness of   breath - call 911 immediately for transfer to the hospital emergency department.  . If you develop a fever greater that 101 F, purulent drainage from wound, increased redness or drainage from wound, foul odor from the wound/dressing, or calf pain - CONTACT YOUR SURGEON.                                                   FOLLOW-UP APPOINTMENTS Make sure you keep all of your appointments after your operation with your surgeon and caregivers. You should call the office at the above phone number and make an appointment for approximately two weeks after the date of your surgery or on the date instructed by your surgeon outlined in the "After Visit Summary".  RANGE OF MOTION AND STRENGTHENING EXERCISES  Rehabilitation of the knee is important following a knee injury or an operation. After just a few days of immobilization, the muscles of the thigh which control the knee become weakened and shrink (atrophy). Knee  exercises are designed to build up the tone and strength of the thigh muscles and to improve knee motion. Often times heat used for twenty to thirty minutes before working out will loosen up your tissues and help with improving the range of motion but do not use heat for the first two weeks following surgery. These exercises can be done on a training (exercise) mat, on the floor, on a table or on a bed. Use what ever works the best and is most comfortable for you Knee exercises include:  . Leg Lifts - While your knee is still immobilized in a splint or cast, you can do straight leg raises. Lift the leg to 60 degrees, hold for 3 sec, and slowly lower the leg. Repeat 10-20 times 2-3 times daily. Perform this exercise against resistance later as your knee gets better.  . Quad and Hamstring Sets - Tighten up the muscle on the front of the thigh (Quad) and hold for 5-10 sec. Repeat this 10-20 times hourly. Hamstring sets are done by pushing the foot backward against an object and holding for 5-10 sec. Repeat as with quad sets.   Leg Slides: Lying on your back, slowly slide your foot toward your buttocks, bending your knee up off the floor (only go as far as is comfortable). Then slowly slide your foot back down until your leg is flat on the floor again.  Angel Wings: Lying on your back spread your legs to the side as far apart as you can without causing discomfort.  A rehabilitation program following serious knee injuries can speed recovery and prevent re-injury in the future due to weakened muscles. Contact your doctor or a physical therapist for more information on knee rehabilitation.   IF YOU ARE TRANSFERRED TO A SKILLED REHAB FACILITY If the patient is transferred to a skilled rehab facility following release from the hospital, a list of the current medications will be sent to the facility for the patient to continue.  When discharged from the skilled rehab facility, please have the facility set up the  patient's Home Health Physical Therapy prior to being released. Also, the skilled facility will be responsible for providing the patient with their medications at time of release from the facility to include their pain medication, the muscle relaxants, and their blood thinner medication. If the patient is still at the   rehab facility at time of the two week follow up appointment, the skilled rehab facility will also need to assist the patient in arranging follow up appointment in our office and any transportation needs.  MAKE SURE YOU:  . Understand these instructions.  . Get help right away if you are not doing well or get worse.    Pick up stool softner and laxative for home use following surgery while on pain medications. Do not submerge incision under water. Please use good hand washing techniques while changing dressing each day. May shower starting three days after surgery. Please use a clean towel to pat the incision dry following showers. Continue to use ice for pain and swelling after surgery. Do not use any lotions or creams on the incision until instructed by your surgeon.   Information on my medicine - XARELTO (Rivaroxaban)  Why was Xarelto prescribed for you? Xarelto was prescribed for you to reduce the risk of blood clots forming after orthopedic surgery. The medical term for these abnormal blood clots is venous thromboembolism (VTE).  What do you need to know about xarelto ? Take your Xarelto ONCE DAILY at the same time every day. You may take it either with or without food.  If you have difficulty swallowing the tablet whole, you may crush it and mix in applesauce just prior to taking your dose.  Take Xarelto exactly as prescribed by your doctor and DO NOT stop taking Xarelto without talking to the doctor who prescribed the medication.  Stopping without other VTE prevention medication to take the place of Xarelto may increase your risk of developing a clot.  After  discharge, you should have regular check-up appointments with your healthcare provider that is prescribing your Xarelto.    What do you do if you miss a dose? If you miss a dose, take it as soon as you remember on the same day then continue your regularly scheduled once daily regimen the next day. Do not take two doses of Xarelto on the same day.   Important Safety Information A possible side effect of Xarelto is bleeding. You should call your healthcare provider right away if you experience any of the following: ? Bleeding from an injury or your nose that does not stop. ? Unusual colored urine (red or dark brown) or unusual colored stools (red or black). ? Unusual bruising for unknown reasons. ? A serious fall or if you hit your head (even if there is no bleeding).  Some medicines may interact with Xarelto and might increase your risk of bleeding while on Xarelto. To help avoid this, consult your healthcare provider or pharmacist prior to using any new prescription or non-prescription medications, including herbals, vitamins, non-steroidal anti-inflammatory drugs (NSAIDs) and supplements.  This website has more information on Xarelto: [email or web address]

## 2020-03-03 NOTE — Anesthesia Procedure Notes (Signed)
Anesthesia Regional Block: Adductor canal block   Pre-Anesthetic Checklist: ,, timeout performed, Correct Patient, Correct Site, Correct Laterality, Correct Procedure, Correct Position, site marked, Risks and benefits discussed,  Surgical consent,  Pre-op evaluation,  At surgeon's request and post-op pain management  Laterality: Lower and Left  Prep: chloraprep       Needles:  Injection technique: Single-shot  Needle Type: Echogenic Stimulator Needle     Needle Length: 9cm  Needle Gauge: 21   Needle insertion depth: 4 cm   Additional Needles:   Procedures:,,,, ultrasound used (permanent image in chart),,,,  Narrative:  Start time: 03/03/2020 11:10 AM End time: 03/03/2020 11:20 AM Injection made incrementally with aspirations every 5 mL.  Performed by: Personally  Anesthesiologist: Lyn Hollingshead, MD

## 2020-03-03 NOTE — Progress Notes (Signed)
Assisted Dr. Hatchett with left, ultrasound guided, adductor canal block. Side rails up, monitors on throughout procedure. See vital signs in flow sheet. Tolerated Procedure well. 

## 2020-03-03 NOTE — Anesthesia Preprocedure Evaluation (Signed)
Anesthesia Evaluation  Patient identified by MRN, date of birth, ID band Patient awake    Reviewed: Allergy & Precautions, NPO status , Patient's Chart, lab work & pertinent test results  History of Anesthesia Complications (+) PONV and history of anesthetic complications  Airway Mallampati: II  TM Distance: >3 FB Neck ROM: Full    Dental  (+) Teeth Intact, Dental Advisory Given   Pulmonary    breath sounds clear to auscultation       Cardiovascular negative cardio ROS Normal cardiovascular exam Rhythm:Regular Rate:Normal     Neuro/Psych negative neurological ROS  negative psych ROS   GI/Hepatic Neg liver ROS, PUD,   Endo/Other  Hypothyroidism   Renal/GU negative Renal ROS     Musculoskeletal  (+) Arthritis , Osteoarthritis,    Abdominal (+) + obese,   Peds  Hematology   Anesthesia Other Findings   Reproductive/Obstetrics                             Lab Results  Component Value Date   WBC 12.1 (H) 02/22/2020   HGB 11.6 (L) 02/22/2020   HCT 38.5 02/22/2020   MCV 89.5 02/22/2020   PLT 314 02/22/2020   Lab Results  Component Value Date   CREATININE 0.85 02/22/2020   BUN 22 02/22/2020   NA 141 02/22/2020   K 4.2 02/22/2020   CL 106 02/22/2020   CO2 25 02/22/2020   Lab Results  Component Value Date   INR 1.0 02/22/2020   INR 1.0 10/18/2019   INR 1.17 10/25/2016     Anesthesia Physical  Anesthesia Plan  ASA: II  Anesthesia Plan: Spinal   Post-op Pain Management:  Regional for Post-op pain   Induction: Intravenous  PONV Risk Score and Plan: 4 or greater and Ondansetron, Propofol infusion, Treatment may vary due to age or medical condition and Dexamethasone  Airway Management Planned: Natural Airway and Simple Face Mask  Additional Equipment: None  Intra-op Plan:   Post-operative Plan:   Informed Consent: I have reviewed the patients History and Physical,  chart, labs and discussed the procedure including the risks, benefits and alternatives for the proposed anesthesia with the patient or authorized representative who has indicated his/her understanding and acceptance.       Plan Discussed with: CRNA  Anesthesia Plan Comments:         Anesthesia Quick Evaluation

## 2020-03-03 NOTE — Evaluation (Signed)
Physical Therapy Evaluation Patient Details Name: Andrea Martinez MRN: [id number] DOB: December 21, 1945 Today's Date: 03/03/2020   History of Present Illness  Patient is 74 y.o. female s/p Lt TKA on 03/03/20 with PMH significant for CML, hypoithyroidn, Rt TKA on 10/22/19.  Clinical Impression  Andrea Martinez is a 74 y.o. female POD 0 s/p Lt TKA. Patient reports modified independence with use of RW for mobility and assist as needed from spouse for ADL's at baseline. Patient is now limited by functional impairments (see PT problem list below) and requires min assist for transfers and gait with RW. Patient was able to ambulate ~25 feet with RW and min assist for safety and to stabilize Lt knee in stance. Patient instructed in exercise to facilitate ROM and circulation. Patient will benefit from continued skilled PT interventions to address impairments and progress towards PLOF. Acute PT will follow to progress mobility and stair training in preparation for safe discharge home.     Follow Up Recommendations Follow surgeon's recommendation for DC plan and follow-up therapies    Equipment Recommendations  None recommended by PT    Recommendations for Other Services       Precautions / Restrictions Precautions Precautions: Fall Restrictions Weight Bearing Restrictions: No      Mobility  Bed Mobility Overal bed mobility: Needs Assistance Bed Mobility: Supine to Sit     Supine to sit: Min assist;HOB elevated     General bed mobility comments: verbal cues for sequencing walking LE's to EOB and assist required for reachign to bed rail and to raise turnk upright.  Transfers Overall transfer level: Needs assistance Equipment used: Rolling walker (2 wheeled) Transfers: Sit to/from Stand Sit to Stand: From elevated surface;Min assist         General transfer comment: cues for hand placement and technique. assist required to complete power up and steady during  rise.  Ambulation/Gait Ambulation/Gait assistance: Min assist Gait Distance (Feet): 25 Feet Assistive device: Rolling walker (2 wheeled) Gait Pattern/deviations: Step-to pattern;Decreased stance time - left;Decreased weight shift to left;Decreased stride length Gait velocity: decreased   General Gait Details: verbal cues for step sequencing with RW and for safe proximity. some buckling of Lt knee noted and pt required manual facilitation of Lt knee to maitain extension in stance phase. pt required assist to maintain safe proximity to RW as well and repeated cues for step pattern.  Stairs       Wheelchair Mobility    Modified Rankin (Stroke Patients Only)       Balance Overall balance assessment: Needs assistance Sitting-balance support: Feet supported Sitting balance-Leahy Scale: Good     Standing balance support: During functional activity;Bilateral upper extremity supported Standing balance-Leahy Scale: Poor            Pertinent Vitals/Pain Pain Assessment: 0-10 Pain Score: 9  Pain Location: Lt knee Pain Descriptors / Indicators: Aching;Grimacing Pain Intervention(s): Monitored during session;Patient requesting pain meds-RN notified;Ice applied;Limited activity within patient's tolerance    Home Living Family/patient expects to be discharged to:: Private residence Living Arrangements: Spouse/significant other Available Help at Discharge: Family;Available PRN/intermittently Type of Home: House Home Access: Stairs to enter Entrance Stairs-Rails: None Entrance Stairs-Number of Steps: 1 Home Layout: Laundry or work area in basement;Able to live on main level with bedroom/bathroom;One level Home Equipment: [place] - 2 wheels;Shower seat;Bedside commode;Cane - single point;Wheelchair - manual;Toilet riser      Prior Function Level of Independence: Needs assistance   Gait / Transfers Assistance Needed: pt has been  using RW dur to pain in Lt knee, he has assist from  her husband as needed.  ADL's / Homemaking Assistance Needed: pt's husand helps her to get dressed and to help her bathe if needed. pt's son comes by to help with laundry and house work.  Comments: pt is still helping to cook, and clean while using her RW to get around. She does not drive. She has been sleep in recliner recently.     Hand Dominance   Dominant Hand: Right    Extremity/Trunk Assessment   Upper Extremity Assessment Upper Extremity Assessment: Overall WFL for tasks assessed    Lower Extremity Assessment Lower Extremity Assessment: LLE deficits/detail LLE Deficits / Details: good quad activation and no extensor lag with SLR. some buckling noted in weight bearing. LLE Sensation: WNL LLE Coordination: WNL    Cervical / Trunk Assessment Cervical / Trunk Assessment: Other exceptions Cervical / Trunk Exceptions: large habitus  Communication   Communication: No difficulties  Cognition Arousal/Alertness: Awake/alert Behavior During Therapy: WFL for tasks assessed/performed Overall Cognitive Status: Within Functional Limits for tasks assessed             General Comments      Exercises Total Joint Exercises Ankle Circles/Pumps: AROM;Both;15 reps;Seated Quad Sets: AROM;Left;5 reps;Seated Heel Slides: AROM;Left;5 reps;Seated   Assessment/Plan    PT Assessment Patient needs continued PT services  PT Problem List Decreased range of motion;Decreased strength;Decreased activity tolerance;Decreased balance;Decreased mobility;Decreased knowledge of use of DME       PT Treatment Interventions DME instruction;Gait training;Stair training;Functional mobility training;Therapeutic activities;Therapeutic exercise;Balance training;Patient/family education    PT Goals (Current goals can be found in the Care Plan section)  Acute Rehab PT Goals Patient Stated Goal: for both knees to be painfree to walk better PT Goal Formulation: With patient Time For Goal Achievement:  03/10/20 Potential to Achieve Goals: Good    Frequency 7X/week    AM-PAC PT "6 Clicks" Mobility  Outcome Measure Help needed turning from your back to your side while in a flat bed without using bedrails?: A Little Help needed moving from lying on your back to sitting on the side of a flat bed without using bedrails?: A Little Help needed moving to and from a bed to a chair (including a wheelchair)?: A Little Help needed standing up from a chair using your arms (e.g., wheelchair or bedside chair)?: A Little Help needed to walk in hospital room?: A Little Help needed climbing 3-5 steps with a railing? : A Little 6 Click Score: 18    End of Session Equipment Utilized During Treatment: Gait belt Activity Tolerance: Patient tolerated treatment well Patient left: in chair;with call bell/phone within reach;with chair alarm set;with family/visitor present Nurse Communication: Mobility status;Patient requests pain meds PT Visit Diagnosis: Muscle weakness (generalized) (M62.81);Difficulty in walking, not elsewhere classified (R26.2)    Time: [id number] PT Time Calculation (min) (ACUTE ONLY): 25 min   Charges:   PT Evaluation $PT Eval Low Complexity: 1 Low PT Treatments $Therapeutic Exercise: 8-22 mins        Verner Mould, DPT Physical Therapist with [facility] [phone]  03/03/2020 5:41 PM

## 2020-03-03 NOTE — Interval H&P Note (Signed)
History and Physical Interval Note:  03/03/2020 9:36 AM  Andrea Martinez  has presented today for surgery, with the diagnosis of left knee osteoarthritis.  The various methods of treatment have been discussed with the patient and family. After consideration of risks, benefits and other options for treatment, the patient has consented to  Procedure(s) with comments: TOTAL KNEE ARTHROPLASTY (Left) - 37min as a surgical intervention.  The patient's history has been reviewed, patient examined, no change in status, stable for surgery.  I have reviewed the patient's chart and labs.  Questions were answered to the patient's satisfaction.     Pilar Plate Kaid Seeberger

## 2020-03-03 NOTE — Transfer of Care (Signed)
Immediate Anesthesia Transfer of Care Note  Patient: Andrea Martinez  Procedure(s) Performed: TOTAL KNEE ARTHROPLASTY (Left Knee)  Patient Location: PACU  Anesthesia Type:Spinal and MAC combined with regional for post-op pain  Level of Consciousness: sedated and patient cooperative  Airway & Oxygen Therapy: Patient Spontanous Breathing and Patient connected to face mask oxygen  Post-op Assessment: Report given to RN and Post -op Vital signs reviewed and stable  Post vital signs: Reviewed and stable  Last Vitals:  Vitals Value Taken Time  BP 125/51 03/03/20 1351  Temp    Pulse 71 03/03/20 1351  Resp 16 03/03/20 1350  SpO2 100% 03/03/20  Vitals shown include unvalidated device data.  Last Pain:  Vitals:   03/03/20 0910  TempSrc:   PainSc: 8          Complications: No apparent anesthesia complications

## 2020-03-03 NOTE — Anesthesia Procedure Notes (Signed)
Procedure Name: MAC Date/Time: 03/03/2020 11:52 AM Performed by: West Pugh, CRNA Pre-anesthesia Checklist: Patient identified, Emergency Drugs available, Suction available, Patient being monitored and Timeout performed Patient Re-evaluated:Patient Re-evaluated prior to induction Oxygen Delivery Method: Simple face mask Preoxygenation: Pre-oxygenation with 100% oxygen Induction Type: IV induction Placement Confirmation: positive ETCO2 Dental Injury: Teeth and Oropharynx as per pre-operative assessment

## 2020-03-04 ENCOUNTER — Encounter: Payer: Self-pay | Admitting: *Deleted

## 2020-03-04 DIAGNOSIS — C9210 Chronic myeloid leukemia, BCR/ABL-positive, not having achieved remission: Secondary | ICD-10-CM | POA: Diagnosis not present

## 2020-03-04 DIAGNOSIS — Z7901 Long term (current) use of anticoagulants: Secondary | ICD-10-CM | POA: Diagnosis not present

## 2020-03-04 DIAGNOSIS — Z882 Allergy status to sulfonamides status: Secondary | ICD-10-CM | POA: Diagnosis not present

## 2020-03-04 DIAGNOSIS — E039 Hypothyroidism, unspecified: Secondary | ICD-10-CM | POA: Diagnosis not present

## 2020-03-04 DIAGNOSIS — Z96651 Presence of right artificial knee joint: Secondary | ICD-10-CM | POA: Diagnosis not present

## 2020-03-04 DIAGNOSIS — M1712 Unilateral primary osteoarthritis, left knee: Secondary | ICD-10-CM | POA: Diagnosis not present

## 2020-03-04 LAB — BASIC METABOLIC PANEL
Anion gap: 9 (ref 5–15)
BUN: 24 mg/dL — ABNORMAL HIGH (ref 8–23)
CO2: 23 mmol/L (ref 22–32)
Calcium: 8.6 mg/dL — ABNORMAL LOW (ref 8.9–10.3)
Chloride: 104 mmol/L (ref 98–111)
Creatinine, Ser: 0.92 mg/dL (ref 0.44–1.00)
GFR calc Af Amer: >60 mL/min (ref >60)
GFR calc non Af Amer: >60 mL/min (ref >60)
Glucose, Bld: 167 mg/dL — ABNORMAL HIGH (ref 70–99)
Potassium: 5.3 mmol/L — ABNORMAL HIGH (ref 3.5–5.1)
Sodium: 136 mmol/L (ref 135–145)

## 2020-03-04 LAB — CBC
HCT: 32.6 % — ABNORMAL LOW (ref 36.0–46.0)
Hemoglobin: 9.8 g/dL — ABNORMAL LOW (ref 12.0–15.0)
MCH: 27.4 pg (ref 26.0–34.0)
MCHC: 30.1 g/dL (ref 30.0–36.0)
MCV: 91.1 fL (ref 80.0–100.0)
Platelets: 232 10*3/uL (ref 150–400)
RBC: 3.58 MIL/uL — ABNORMAL LOW (ref 3.87–5.11)
RDW: 14.6 % (ref 11.5–15.5)
WBC: 16.6 10*3/uL — ABNORMAL HIGH (ref 4.0–10.5)
nRBC: 0 % (ref 0.0–0.2)

## 2020-03-04 MED ORDER — OXYCODONE HCL 5 MG PO TABS: 5.0000 mg | ORAL_TABLET | Freq: Four times a day (QID) | ORAL | 0 refills | Status: DC | PRN

## 2020-03-04 MED ORDER — GABAPENTIN 300 MG PO CAPS: 300.0000 mg | ORAL_CAPSULE | Freq: Three times a day (TID) | ORAL | 0 refills | Status: DC

## 2020-03-04 MED ORDER — RIVAROXABAN 10 MG PO TABS: 10.0000 mg | ORAL_TABLET | Freq: Every day | ORAL | 0 refills | Status: DC

## 2020-03-04 MED ORDER — METHOCARBAMOL 500 MG PO TABS: 500.0000 mg | ORAL_TABLET | Freq: Four times a day (QID) | ORAL | 0 refills | Status: DC | PRN

## 2020-03-04 NOTE — Progress Notes (Signed)
Subjective: 1 Day Post-Op Procedure(s) (LRB): TOTAL KNEE ARTHROPLASTY (Left) Patient reports pain as mild.   Patient seen in rounds by Dr. Wynelle Link. Patient is well, and has had no acute complaints or problems. No issues overnight. Denies chest pain, SOB, or calf pain. Foley catheter to be removed this AM. We will continue therapy today.   Objective: Vital signs in last 24 hours: Temp:  [96.5 F (35.8 C)-98.7 F (37.1 C)] 97.8 F (36.6 C) (03/23 0627) Pulse Rate:  [58-84] 58 (03/23 0627) Resp:  [8-32] 16 (03/23 0627) BP: (116-158)/(51-73) 123/61 (03/23 0627) SpO2:  [89 %-100 %] 98 % (03/23 0627) Weight:  [100.8 kg] 100.8 kg (03/22 0854)  Intake/Output from previous day:  Intake/Output Summary (Last 24 hours) at 03/04/2020 0750 Last data filed at 03/04/2020 [id number] Gross per 24 hour  Intake 3543.96 ml  Output 1160 ml  Net 2383.96 ml     Intake/Output this shift: No intake/output data recorded.  Labs: Recent Labs    03/04/20 0326  HGB 9.8*   Recent Labs    03/04/20 0326  WBC 16.6*  RBC 3.58*  HCT 32.6*  PLT 232   Recent Labs    03/04/20 0326  NA 136  K 5.3*  CL 104  CO2 23  BUN 24*  CREATININE 0.92  GLUCOSE 167*  CALCIUM 8.6*   No results for input(s): LABPT, INR in the last 72 hours.  Exam: General - Patient is Alert and Oriented Extremity - Neurologically intact Neurovascular intact Sensation intact distally Dorsiflexion/Plantar flexion intact Dressing - dressing C/D/I Motor Function - intact, moving foot and toes well on exam.   Past Medical History:  Diagnosis Date  . Arthritis   . 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 Gastric ulcer 2011   EGD, 5/11  . Hypothyroidism    not on meds, followed by Dr. Elyse Hsu  . Leukocytosis 11/20/2015  . Lipoma    left upper arm  . Pericardial effusion    a. HCAP complicated by pericardial effusion requiring pericardial window 10/2016 and large L pleural effusion  requring VATS.  Marland Kitchen Pleural effusion    a. s/p VATS 2017.  Marland Kitchen Pneumonia 10/2016  . PONV (postoperative nausea and vomiting)   . Prolonged QT interval   . Urine incontinence   . UTI (lower urinary tract infection)    frequent    Assessment/Plan: 1 Day Post-Op Procedure(s) (LRB): TOTAL KNEE ARTHROPLASTY (Left) Active Problems:   Primary osteoarthritis of left knee  Estimated body mass index is 36.98 kg/m as calculated from the following:   Height as of this encounter: 5\' 5"  (1.651 m).   Weight as of this encounter: 100.8 kg. Advance diet Up with therapy D/C IV fluids   Patient's anticipated LOS is less than 2 midnights, meeting these requirements: - Younger than 107 - Lives within 1 hour of care - Has a competent adult at home to recover with post-op recover - NO history of  - Chronic pain requiring opiods  - Diabetes  - Coronary Artery Disease  - Heart failure  - Heart attack  - Stroke  - DVT/VTE  - Cardiac arrhythmia  - Respiratory Failure/COPD  - Renal failure  - Anemia  - Advanced Liver disease   DVT Prophylaxis - Xarelto Weight bearing as tolerated. D/C O2 and pulse ox and try on room air. Hemovac pulled without difficulty, will continue therapy today.  Plan is to go Home after hospital stay. Plan for discharge later today if progresses  with therapy and meeting her goals. Scheduled for outpatient physical therapy at [facility] in [place]. Follow-up in the office in 2 weeks.   Theresa Duty, PA-C Orthopedic Surgery [phone] 03/04/2020, 7:50 AM

## 2020-03-04 NOTE — Progress Notes (Signed)
Physical Therapy Treatment Patient Details Name: Andrea Martinez MRN: [id number] DOB: July 21, 1946 Today's Date: 03/04/2020    History of Present Illness Patient is 74 y.o. female s/p Lt TKA on 03/03/20 with PMH significant for CML, hypothyroidism, Rt TKA on 10/22/19.    PT Comments    Pt ambulated to/from bathroom and then performed LE exercises.  Will return for afternoon session for stair training and ambulating again.     Follow Up Recommendations  Follow surgeon's recommendation for DC plan and follow-up therapies     Equipment Recommendations  None recommended by PT    Recommendations for Other Services       Precautions / Restrictions Precautions Precautions: Fall;Knee Restrictions Weight Bearing Restrictions: No    Mobility  Bed Mobility               General bed mobility comments: pt in recliner  Transfers Overall transfer level: Needs assistance Equipment used: Rolling walker (2 wheeled) Transfers: Sit to/from Stand Sit to Stand: Min assist         General transfer comment: cues for hand placement and technique. assist to rise and steady during rise.  Ambulation/Gait Ambulation/Gait assistance: Min assist Gait Distance (Feet): 20 Feet Assistive device: Rolling walker (2 wheeled) Gait Pattern/deviations: Step-to pattern;Decreased stance time - left;Decreased weight shift to left     General Gait Details: verbal cues for sequence, RW positioning, step length; pt only wished to ambulate in room this morning due to urinary incontinence (awaiting depends when spouse arrives)   Marine scientist Rankin (Stroke Patients Only)       Balance                                            Cognition Arousal/Alertness: Awake/alert Behavior During Therapy: WFL for tasks assessed/performed Overall Cognitive Status: Within Functional Limits for tasks assessed                                         Exercises Total Joint Exercises Ankle Circles/Pumps: AROM;Both;10 reps Quad Sets: AROM;10 reps;Left Short Arc Quad: AROM;Left;10 reps Heel Slides: Left;10 reps;AROM;Seated Hip ABduction/ADduction: AROM;10 reps;Left Straight Leg Raises: AROM;Left;10 reps    General Comments        Pertinent Vitals/Pain Pain Assessment: 0-10 Pain Score: 6  Pain Location: Lt knee Pain Descriptors / Indicators: Aching;Grimacing Pain Intervention(s): Repositioned;Premedicated before session;Monitored during session;Ice applied    Home Living                      Prior Function            PT Goals (current goals can now be found in the care plan section) Progress towards PT goals: Progressing toward goals    Frequency    7X/week      PT Plan Current plan remains appropriate    Co-evaluation              AM-PAC PT "6 Clicks" Mobility   Outcome Measure  Help needed turning from your back to your side while in a flat bed without using bedrails?: A Little Help needed moving from lying on your back to sitting on the side of  a flat bed without using bedrails?: A Little Help needed moving to and from a bed to a chair (including a wheelchair)?: A Little Help needed standing up from a chair using your arms (e.g., wheelchair or bedside chair)?: A Little Help needed to walk in hospital room?: A Little Help needed climbing 3-5 steps with a railing? : A Little 6 Click Score: 18    End of Session Equipment Utilized During Treatment: Gait belt Activity Tolerance: Patient tolerated treatment well Patient left: in chair;with call bell/phone within reach;with chair alarm set   PT Visit Diagnosis: Muscle weakness (generalized) (M62.81);Difficulty in walking, not elsewhere classified (R26.2)     Time: [id number] PT Time Calculation (min) (ACUTE ONLY): 29 min  Charges:  $Gait Training: 8-22 mins $Therapeutic Exercise: 8-22 mins                    Arlyce Dice,  DPT Acute Rehabilitation Services Office: [phone]  Trena Platt 03/04/2020, 12:47 PM

## 2020-03-04 NOTE — Progress Notes (Signed)
Physical Therapy Treatment Patient Details Name: Andrea Martinez MRN: [id number] DOB: November 13, 1946 Today's Date: 03/04/2020    History of Present Illness Patient is 74 y.o. female s/p Lt TKA on 03/03/20 with PMH significant for CML, hypothyroidism, Rt TKA on 10/22/19.    PT Comments    Pt assisted to bathroom and then ambulated to step.  Pt practiced one step backwards to enter home.  Pt unsteady at times and reports weakness however eager to d/c home today.  Therapist reported concern of assist level and falls upon d/c, however pt and spouse wish to d/c home today.  Pt's spouse present and feels pt is okay to d/c home.  Pt encouraged to have assist with mobility and use gait belt for safety.  Pt provided with HEP handout.   Follow Up Recommendations  Follow surgeon's recommendation for DC plan and follow-up therapies     Equipment Recommendations  None recommended by PT    Recommendations for Other Services       Precautions / Restrictions Precautions Precautions: Fall;Knee Restrictions Weight Bearing Restrictions: No    Mobility  Bed Mobility               General bed mobility comments: pt in recliner  Transfers Overall transfer level: Needs assistance Equipment used: Rolling walker (2 wheeled) Transfers: Sit to/from Stand Sit to Stand: Min assist         General transfer comment: cues for hand placement and technique. assist to rise and steady during rise.  Ambulation/Gait Ambulation/Gait assistance: Min assist Gait Distance (Feet): 50 Feet Assistive device: Rolling walker (2 wheeled) Gait Pattern/deviations: Step-to pattern;Decreased stance time - left;Decreased weight shift to left Gait velocity: decreased   General Gait Details: verbal cues for sequence, RW positioning, step length; pt fatigued quickly after practicing one step   Stairs Stairs: Yes Stairs assistance: Min assist Stair Management: Backwards;Step to pattern;With walker Number of Stairs:  1 General stair comments: verbal cues for sequence and safety, assist for steady, pt reports LEs feeling weak so recliner brought close to step to sit down after performing   Wheelchair Mobility    Modified Rankin (Stroke Patients Only)       Balance                                            Cognition Arousal/Alertness: Awake/alert Behavior During Therapy: WFL for tasks assessed/performed Overall Cognitive Status: Within Functional Limits for tasks assessed                                        Exercises     General Comments        Pertinent Vitals/Pain Pain Assessment: 0-10 Pain Score: 5  Pain Location: Lt knee Pain Descriptors / Indicators: Aching;Grimacing Pain Intervention(s): Repositioned;Monitored during session    Home Living                      Prior Function            PT Goals (current goals can now be found in the care plan section) Progress towards PT goals: Progressing toward goals    Frequency    7X/week      PT Plan Current plan remains appropriate    Co-evaluation  AM-PAC PT "6 Clicks" Mobility   Outcome Measure  Help needed turning from your back to your side while in a flat bed without using bedrails?: A Little Help needed moving from lying on your back to sitting on the side of a flat bed without using bedrails?: A Little Help needed moving to and from a bed to a chair (including a wheelchair)?: A Little Help needed standing up from a chair using your arms (e.g., wheelchair or bedside chair)?: A Little Help needed to walk in hospital room?: A Little Help needed climbing 3-5 steps with a railing? : A Little 6 Click Score: 18    End of Session Equipment Utilized During Treatment: Gait belt Activity Tolerance: Patient tolerated treatment well Patient left: in chair;with call bell/phone within reach;with chair alarm set   PT Visit Diagnosis: Muscle weakness  (generalized) (M62.81);Difficulty in walking, not elsewhere classified (R26.2)     Time: [id number] PT Time Calculation (min) (ACUTE ONLY): 22 min  Charges:  $Gait Training: 8-22 mins                    Arlyce Dice, DPT Acute Rehabilitation Services Office: [phone]  York Ram E 03/04/2020, 3:06 PM

## 2020-03-07 ENCOUNTER — Ambulatory Visit: Payer: Medicare Other | Admitting: Physical Therapy

## 2020-03-10 ENCOUNTER — Ambulatory Visit: Payer: Medicare Other | Admitting: Physical Therapy

## 2020-03-12 ENCOUNTER — Ambulatory Visit: Payer: Medicare Other | Admitting: Physical Therapy

## 2020-03-25 ENCOUNTER — Ambulatory Visit: Payer: Medicare Other | Attending: Surgical | Admitting: Physical Therapy

## 2020-03-25 ENCOUNTER — Encounter: Payer: Self-pay | Admitting: Physical Therapy

## 2020-03-25 ENCOUNTER — Other Ambulatory Visit: Payer: Self-pay

## 2020-03-25 DIAGNOSIS — M6281 Muscle weakness (generalized): Secondary | ICD-10-CM | POA: Insufficient documentation

## 2020-03-25 DIAGNOSIS — R600 Localized edema: Secondary | ICD-10-CM | POA: Insufficient documentation

## 2020-03-25 DIAGNOSIS — G8929 Other chronic pain: Secondary | ICD-10-CM | POA: Diagnosis not present

## 2020-03-25 DIAGNOSIS — M25562 Pain in left knee: Secondary | ICD-10-CM | POA: Insufficient documentation

## 2020-03-25 NOTE — Therapy (Signed)
[facility] [place], [place], [address] Phone: [phone]   Fax:  [phone]  Physical Therapy Treatment  Patient Details  Name: Andrea Martinez MRN: [id number] Date of Birth: [birth year] Referring Provider (PT): Gaynelle Arabian MD   Encounter Date: 03/25/2020  PT End of Session - 03/25/20 1429    Visit Number  1    Number of Visits  12    Date for PT Re-Evaluation  05/06/20    Authorization Type  FOTO AT LEAST EVERY 5TH VISIT.  PROGRESS NOTE AT 10TH VISIT.  KX MODIFIER AFTER 15 VISITS.    PT Start Time  0145    PT Stop Time  0226    PT Time Calculation (min)  41 min    Activity Tolerance  Patient tolerated treatment well    Behavior During Therapy  [facility] for tasks assessed/performed       Past Medical History:  Diagnosis Date  . Arthritis   . Chronic myelogenous leukemia (CML), BCR-ABL1-positive ([place]) 11/20/2015  . Gastric ulcer 2011   EGD, 5/11  . Hypothyroidism    not on meds, followed by Dr. Elyse Hsu  . Leukocytosis 11/20/2015  . Lipoma    left upper arm  . Pericardial effusion    a. HCAP complicated by pericardial effusion requiring pericardial window 10/2016 and large L pleural effusion requring VATS.  Marland Kitchen Pleural effusion    a. s/p VATS 2017.  Marland Kitchen Pneumonia 10/2016  . PONV (postoperative nausea and vomiting)   . Prolonged QT interval   . Urine incontinence   . UTI (lower urinary tract infection)    frequent    Past Surgical History:  Procedure Laterality Date  . BRAVO [place] STUDY  12/04/2012   Procedure: BRAVO [place];  Surgeon: [name], MD;  [facility];  Service: Endoscopy;  Laterality: N/A;  . CHOLECYSTECTOMY N/A 01/03/2014   Procedure: LAPAROSCOPIC CHOLECYSTECTOMY WITH INTRAOPERATIVE CHOLANGIOGRAM;  Surgeon: Joyice Faster. Cornett, MD;  Location: [place];  Service: General;  Laterality: N/A;  . COLONOSCOPY  01/2004   DR [facility], few small tics  . ESOPHAGOGASTRODUODENOSCOPY   02/2010   gastric ulcers  . ESOPHAGOGASTRODUODENOSCOPY  12/04/2012   [id number] gastritis (inflammation) was found in the gastric antrum; multiple biopsies The duodenal mucosa showed no abnormalities in the bulb and second portion of the duodenum  . JOINT REPLACEMENT Right 10/22/2019  . KYPHOPLASTY N/A 02/14/2019   Procedure: KYPHOPLASTY T12 and L1;  Surgeon: Melina Schools, MD;  Location: [place];  Service: Orthopedics;  Laterality: N/A;  120 mins  . LIPOMA EXCISION  08/02/2011   left shoulder  . NOSE SURGERY    . PARTIAL HYSTERECTOMY     vaginal at age 52 years of age  . TOE DEBRIDEMENT Left 1962   lt great toe  . TOTAL KNEE ARTHROPLASTY Right 10/22/2019   Procedure: TOTAL KNEE ARTHROPLASTY;  Surgeon: Gaynelle Arabian, MD;  Location: [facility];  Service: Orthopedics;  Laterality: Right;  97min  . TOTAL KNEE ARTHROPLASTY Left 03/03/2020   Procedure: TOTAL KNEE ARTHROPLASTY;  Surgeon: Gaynelle Arabian, MD;  Location: [facility];  Service: Orthopedics;  Laterality: Left;  51min  . VIDEO ASSISTED THORACOSCOPY (VATS)/EMPYEMA Left 10/25/2016   Procedure: VIDEO ASSISTED THORACOSCOPY (VATS), BRONCH,DRAINAGE OF PLEURAL EFFUSION,PERICARDIAL WINDOW WITH DRAINAGE OF PERICARDIAL FLUID, TEE;  Surgeon: [name], MD;  Location: [place];  Service: Thoracic;  Laterality: Left;  Marland Kitchen VIDEO BRONCHOSCOPY N/A 10/25/2016   Procedure: VIDEO BRONCHOSCOPY;  Surgeon: Remo Lipps  Chaya Jan, MD;  Location: [place];  Service: Thoracic;  Laterality: N/A;    There were no vitals filed for this visit.  Subjective Assessment - 03/25/20 1413    Subjective  COVID-19 screen performed prior to patient entering clinic.  The patient underwent a left total knee replacement on 03/03/20.  Her pain-level is a 6/10 today but higher whenbensing her knee.  Pain medication helps decrease pain.    Pertinent History  Leukemia, Kyphoplasty, OA.    How long can you stand comfortably?  Today, < two minutes.    How long can you walk comfortably?  See  evaluation.    Patient Stated Goals  Get out of pain and walk.    Currently in Pain?  Yes    Pain Score  6     Pain Location  Knee    Pain Orientation  Left    Pain Descriptors / Indicators  Aching    Pain Type  Surgical pain    Pain Onset  1 to 4 weeks ago    Pain Frequency  Constant    Aggravating Factors   See above.    Pain Relieving Factors  See above.         [facility] PT Assessment - 03/25/20 0001      Assessment   Medical Diagnosis  Left total knee replacement.    Referring Provider (PT)  Gaynelle Arabian MD    Onset Date/Surgical Date  --   03/03/20.     Precautions   Precaution Comments  --   No ultrasound.     Balance Screen   Has the patient fallen in the past 6 months  No    Has the patient had a decrease in activity level because of a fear of falling?   No    Is the patient reluctant to leave their home because of a fear of falling?   No      Home Environment   Living Environment  Private residence      Prior Function   Level of Independence  Independent      Observation/Other Assessments   Observations  Left knee incision appears to be healing well.    Focus on Therapeutic Outcomes (FOTO)   79%.      Observation/Other Assessments-Edema    Edema  Circumferential   Knees are equal.     AROM   Overall AROM Comments  Left knee -12 degrees to 82 in supine.      Strength   Overall Strength Comments  Left hip= 3+/5 and knee extension= 3+ to 4-/5.      Palpation   Palpation comment  Diffuse anterior and posterior left knee pain.      Ambulation/Gait   Gait Comments  Patient ambulating with a FWW in some trunk flexion.                   [facility] Adult PT Treatment/Exercise - 03/25/20 0001      Modalities   Modalities  Electrical Stimulation;Vasopneumatic      Electrical Stimulation   Electrical Stimulation Location  Left knee.    Electrical Stimulation Action  IFC    Electrical Stimulation Parameters  80-150 Hz x 15 minutes.    Electrical  Stimulation Goals  Edema;Pain      Vasopneumatic   Number Minutes Vasopneumatic   15 minutes    Vasopnuematic Location   --   Left knee.   Vasopneumatic Pressure  Low  PT Short Term Goals - 03/25/20 1442      PT SHORT TERM GOAL #1   Title  Independent with an initial HEP.    Time  2    Period  Weeks    Status  New      PT SHORT TERM GOAL #2   Title  Full active left knee extension.    Time  2    Period  Weeks    Status  New        PT Long Term Goals - 03/25/20 1443      PT LONG TERM GOAL #1   Title  Left active knee flexion to 105-110 degrees.    Time  6    Period  Weeks    Status  New      PT LONG TERM GOAL #2   Title  Perform 4 stairs with one railing.    Time  6    Period  Weeks    Status  New      PT LONG TERM GOAL #3   Title  Left hip and knee strength to 4+/5 to increase stability for functional activities.    Time  6    Period  Weeks    Status  New            Plan - 03/25/20 1431    Clinical Impression Statement  The patient presents to OPPT s/p left total knee replacement performed on 03/03/20.  She presented to the clinic today ambulating with a FWW.  She lacks some flexion and extension but is doing quite well.  She has some left knee edema but it is equal to her right knee which also recently was replaced.  She has some strength losses as well.  Patient will benefit from skilled physical therapy intervention to address deficits and pain.    Personal Factors and Comorbidities  Comorbidity 1;Comorbidity 2    Comorbidities  Leukemia, Kyphoplasty, OA.    Examination-Activity Limitations  Bathing;Bed Mobility;Locomotion Level;Other;Transfers;Toileting;Stand;Stairs    Examination-Participation Restrictions  Cleaning;Community Activity;Other    Stability/Clinical Decision Making  Stable/Uncomplicated    Rehab Potential  Good    PT Frequency  2x / week    PT Duration  6 weeks    PT Treatment/Interventions  ADLs/Self Care Home  Management;Cryotherapy;Electrical Stimulation;Gait training;Stair training;Functional mobility training;Therapeutic activities;Therapeutic exercise;Neuromuscular re-education;Manual techniques;Patient/family education;Passive range of motion;Vasopneumatic Device    PT Next Visit Plan  Nustep, PROM, AAROM, vasopneumatic/e'stim.    Consulted and Agree with Plan of Care  Patient       Patient will benefit from skilled therapeutic intervention in order to improve the following deficits and impairments:  Abnormal gait, Difficulty walking, Decreased activity tolerance, Decreased mobility, Decreased range of motion, Decreased strength, Increased edema, Pain  Visit Diagnosis: Chronic pain of left knee - Plan: PT plan of care cert/re-cert  Localized edema - Plan: PT plan of care cert/re-cert  Muscle weakness (generalized) - Plan: PT plan of care cert/re-cert     Problem List Patient Active Problem List   Diagnosis Date Noted  . Primary osteoarthritis of left knee 03/03/2020  . Thoracic compression fracture ([place]) 02/14/2019  . Sweating abnormality 10/11/2017  . Iron deficiency anemia 02/16/2017  . Pleural effusion 10/23/2016  . Solitary pulmonary nodule 10/23/2016  . Anemia 11/21/2015  . Bladder prolapse, female, acquired 11/21/2015  . IBS (irritable bowel syndrome) 11/21/2015  . Neutrophilic leukocytosis 0000000  . OA (osteoarthritis) of knee 11/21/2015  . CML (chronic myeloid leukemia) ([place])  11/20/2015  . Biliary dyskinesia 12/04/2013  . Dyspepsia 11/30/2012  . Rib pain 11/01/2011  . Colon cancer screening 09/21/2011  . Lipoma 07/27/2011  . Lipoma of arm 07/27/2011  . Mass of arm 07/08/2011  . PUD 05/20/2010  . ABDOMINAL PAIN RIGHT UPPER QUADRANT 02/12/2010  . Hypothyroidism 02/11/2010  . NAUSEA 02/11/2010  . UTI'S, HX OF 02/11/2010    Joetta Delprado, [place] MPT 03/25/2020, 2:45 PM  [facility] [address] [place], [place],  [address] Phone: [phone]   Fax:  [phone]  Name: Andrea Martinez MRN: [id number] Date of Birth: 1946-10-17

## 2020-03-27 ENCOUNTER — Other Ambulatory Visit: Payer: Self-pay

## 2020-03-27 ENCOUNTER — Ambulatory Visit: Payer: Medicare Other | Admitting: Physical Therapy

## 2020-03-27 ENCOUNTER — Encounter: Payer: Self-pay | Admitting: Physical Therapy

## 2020-03-27 DIAGNOSIS — M25562 Pain in left knee: Secondary | ICD-10-CM

## 2020-03-27 DIAGNOSIS — R600 Localized edema: Secondary | ICD-10-CM

## 2020-03-27 DIAGNOSIS — G8929 Other chronic pain: Secondary | ICD-10-CM | POA: Diagnosis not present

## 2020-03-27 DIAGNOSIS — M6281 Muscle weakness (generalized): Secondary | ICD-10-CM | POA: Diagnosis not present

## 2020-03-27 NOTE — Therapy (Addendum)
[facility] [place], [place], [address] Phone: [phone]   Fax:  [phone]  Physical Therapy Treatment  Patient Details  Name: Andrea Martinez MRN: [id number] Date of Birth: May 29, 1946 Referring Provider (PT): Gaynelle Arabian MD   Encounter Date: 03/27/2020  PT End of Session - 03/27/20 1355    Visit Number  2    Number of Visits  12    Date for PT Re-Evaluation  05/06/20    Authorization Type  FOTO AT LEAST EVERY 5TH VISIT.  PROGRESS NOTE AT 10TH VISIT.  KX MODIFIER AFTER 15 VISITS.    PT Start Time  1348    PT Stop Time  1432    PT Time Calculation (min)  44 min    Equipment Utilized During Treatment  Other (comment)   FWW   Activity Tolerance  Patient limited by pain    Behavior During Therapy  Anxious       Past Medical History:  Diagnosis Date  . Arthritis   . Chronic myelogenous leukemia (CML), BCR-ABL1-positive ([place]) 11/20/2015  . Gastric ulcer 2011   EGD, 5/11  . Hypothyroidism    not on meds, followed by Dr. Elyse Hsu  . Leukocytosis 11/20/2015  . Lipoma    left upper arm  . Pericardial effusion    a. HCAP complicated by pericardial effusion requiring pericardial window 10/2016 and large L pleural effusion requring VATS.  Marland Kitchen Pleural effusion    a. s/p VATS 2017.  Marland Kitchen Pneumonia 10/2016  . PONV (postoperative nausea and vomiting)   . Prolonged QT interval   . Urine incontinence   . UTI (lower urinary tract infection)    frequent    Past Surgical History:  Procedure Laterality Date  . BRAVO [place] STUDY  12/04/2012   Procedure: BRAVO [place];  Surgeon: [name], MD;  [facility];  Service: Endoscopy;  Laterality: N/A;  . CHOLECYSTECTOMY N/A 01/03/2014   Procedure: LAPAROSCOPIC CHOLECYSTECTOMY WITH INTRAOPERATIVE CHOLANGIOGRAM;  Surgeon: Joyice Faster. Cornett, MD;  Location: [place];  Service: General;  Laterality: N/A;  . COLONOSCOPY  01/2004   DR [facility] few small tics  .  ESOPHAGOGASTRODUODENOSCOPY  02/2010   gastric ulcers  . ESOPHAGOGASTRODUODENOSCOPY  12/04/2012   [id number] gastritis (inflammation) was found in the gastric antrum; multiple biopsies The duodenal mucosa showed no abnormalities in the bulb and second portion of the duodenum  . JOINT REPLACEMENT Right 10/22/2019  . KYPHOPLASTY N/A 02/14/2019   Procedure: KYPHOPLASTY T12 and L1;  Surgeon: Melina Schools, MD;  Location: [place];  Service: Orthopedics;  Laterality: N/A;  120 mins  . LIPOMA EXCISION  08/02/2011   left shoulder  . NOSE SURGERY    . PARTIAL HYSTERECTOMY     vaginal at age 42 years of age  . TOE DEBRIDEMENT Left 1962   lt great toe  . TOTAL KNEE ARTHROPLASTY Right 10/22/2019   Procedure: TOTAL KNEE ARTHROPLASTY;  Surgeon: Gaynelle Arabian, MD;  [facility];  Service: Orthopedics;  Laterality: Right;  23min  . TOTAL KNEE ARTHROPLASTY Left 03/03/2020   Procedure: TOTAL KNEE ARTHROPLASTY;  Surgeon: Gaynelle Arabian, MD;  [facility];  Service: Orthopedics;  Laterality: Left;  72min  . VIDEO ASSISTED THORACOSCOPY (VATS)/EMPYEMA Left 10/25/2016   Procedure: VIDEO ASSISTED THORACOSCOPY (VATS), BRONCH,DRAINAGE OF PLEURAL EFFUSION,PERICARDIAL WINDOW WITH DRAINAGE OF PERICARDIAL FLUID, TEE;  Surgeon: [name], MD;  Location: [place];  Service: Thoracic;  Laterality: Left;  Marland Kitchen VIDEO BRONCHOSCOPY N/A  10/25/2016   Procedure: VIDEO BRONCHOSCOPY;  Surgeon: [name], MD;  [facility];  Service: Thoracic;  Laterality: N/A;    There were no vitals filed for this visit.  Subjective Assessment - 03/27/20 1339    Subjective  COVID 19  screening performed on patient upon arrival. Reports that she has had a lot more pain in the back of L knee today and has sat on ice all day. Reports that she has been walking around her home without her walker.    Pertinent History  Leukemia, Kyphoplasty, OA.    How long can you stand comfortably?  Today, < two minutes.    How long can you  walk comfortably?  See evaluation.    Patient Stated Goals  Get out of pain and walk.    Currently in Pain?  Yes    Pain Score  8     Pain Location  Knee    Pain Orientation  Left;Posterior    Pain Descriptors / Indicators  Aching;Sharp    Pain Type  Surgical pain    Pain Onset  1 to 4 weeks ago    Pain Frequency  Constant         [facility] PT Assessment - 03/27/20 0001      Assessment   Medical Diagnosis  Left total knee replacement.    Referring Provider (PT)  Gaynelle Arabian MD    Onset Date/Surgical Date  03/03/20    Next MD Visit  04/09/2020      ROM / Strength   AROM / PROM / Strength  AROM      AROM   Overall AROM   Deficits    AROM Assessment Site  Knee    Right/Left Knee  Left    Left Knee Flexion  83                   [facility] Adult PT Treatment/Exercise - 03/27/20 0001      Knee/Hip Exercises: Stretches   Passive Hamstring Stretch  Left;3 reps;30 seconds    Passive Hamstring Stretch Limitations  seated      Knee/Hip Exercises: Aerobic   Nustep  L1, seat 10 x15 min      Knee/Hip Exercises: Seated   Hamstring Curl  AROM;Left;10 reps      Modalities   Modalities  Psychologist, educational Location  L knee    Electrical Stimulation Action  IFC    Electrical Stimulation Parameters  80-150 hz x15 min    Electrical Stimulation Goals  Edema;Pain      Vasopneumatic   Number Minutes Vasopneumatic   15 minutes    Vasopnuematic Location   Knee    Vasopneumatic Pressure  Low    Vasopneumatic Temperature   34 for edema/pain               PT Short Term Goals - 03/25/20 1442      PT SHORT TERM GOAL #1   Title  Independent with an initial HEP.    Time  2    Period  Weeks    Status  New      PT SHORT TERM GOAL #2   Title  Full active left knee extension.    Time  2    Period  Weeks    Status  New        PT Long Term Goals - 03/25/20 1443  PT LONG TERM GOAL #1   Title   Left active knee flexion to 105-110 degrees.    Time  6    Period  Weeks    Status  New      PT LONG TERM GOAL #2   Title  Perform 4 stairs with one railing.    Time  6    Period  Weeks    Status  New      PT LONG TERM GOAL #3   Title  Left hip and knee strength to 4+/5 to increase stability for functional activities.    Time  6    Period  Weeks    Status  New            Plan - 03/27/20 1443    Clinical Impression Statement  Patient presented in clinic with reports of increased L knee pain specifically in posterior knee. Increased edema noted throughout LLE. Patient required assist with transitions on Nustep and to elevate LEs. Patient encouraged to keep FWW for now as to avoid any flare ups and not rush recovery as much. Patient very senstiive to any ROM of L knee today. 83 deg measured in short sitting of L knee ROM. Normal modalities response noted following removal of the modalities. Small area of redness located over mid B calf just as after previous R TKR.    Personal Factors and Comorbidities  Comorbidity 1;Comorbidity 2    Comorbidities  Leukemia, Kyphoplasty, OA.    Examination-Activity Limitations  Bathing;Bed Mobility;Locomotion Level;Other;Transfers;Toileting;Stand;Stairs    Examination-Participation Restrictions  Cleaning;Community Activity;Other    Stability/Clinical Decision Making  Stable/Uncomplicated    Rehab Potential  Good    PT Frequency  2x / week    PT Duration  6 weeks    PT Treatment/Interventions  ADLs/Self Care Home Management;Cryotherapy;Electrical Stimulation;Gait training;Stair training;Functional mobility training;Therapeutic activities;Therapeutic exercise;Neuromuscular re-education;Manual techniques;Patient/family education;Passive range of motion;Vasopneumatic Device    PT Next Visit Plan  Nustep, PROM, AAROM, vasopneumatic/e'stim.    Consulted and Agree with Plan of Care  Patient       Patient will benefit from skilled therapeutic  intervention in order to improve the following deficits and impairments:  Abnormal gait, Difficulty walking, Decreased activity tolerance, Decreased mobility, Decreased range of motion, Decreased strength, Increased edema, Pain  Visit Diagnosis: Chronic pain of left knee  Localized edema  Muscle weakness (generalized)     Problem List Patient Active Problem List   Diagnosis Date Noted  . Primary osteoarthritis of left knee 03/03/2020  . Thoracic compression fracture ([place]) 02/14/2019  . Sweating abnormality 10/11/2017  . Iron deficiency anemia 02/16/2017  . Pleural effusion 10/23/2016  . Solitary pulmonary nodule 10/23/2016  . Anemia 11/21/2015  . Bladder prolapse, female, acquired 11/21/2015  . IBS (irritable bowel syndrome) 11/21/2015  . Neutrophilic leukocytosis 0000000  . OA (osteoarthritis) of knee 11/21/2015  . CML (chronic myeloid leukemia) ([place]) 11/20/2015  . Biliary dyskinesia 12/04/2013  . Dyspepsia 11/30/2012  . Rib pain 11/01/2011  . Colon cancer screening 09/21/2011  . Lipoma 07/27/2011  . Lipoma of arm 07/27/2011  . Mass of arm 07/08/2011  . PUD 05/20/2010  . ABDOMINAL PAIN RIGHT UPPER QUADRANT 02/12/2010  . Hypothyroidism 02/11/2010  . NAUSEA 02/11/2010  . UTI'S, HX OF 02/11/2010    Standley Brooking, PTA 03/27/2020, 2:50 PM  [facility] [place], [place], [address] Phone: [phone]   Fax:  [phone]  Name: Andrea Martinez MRN: [id number] Date of Birth: 1946/02/20

## 2020-04-01 ENCOUNTER — Other Ambulatory Visit: Payer: Self-pay

## 2020-04-01 ENCOUNTER — Ambulatory Visit: Payer: Medicare Other | Admitting: Physical Therapy

## 2020-04-01 DIAGNOSIS — G8929 Other chronic pain: Secondary | ICD-10-CM | POA: Diagnosis not present

## 2020-04-01 DIAGNOSIS — M25562 Pain in left knee: Secondary | ICD-10-CM

## 2020-04-01 DIAGNOSIS — M6281 Muscle weakness (generalized): Secondary | ICD-10-CM | POA: Diagnosis not present

## 2020-04-01 DIAGNOSIS — R600 Localized edema: Secondary | ICD-10-CM

## 2020-04-01 NOTE — Therapy (Addendum)
[facility] [place], [place], [address] Phone: [phone]   Fax:  [phone]  Physical Therapy Treatment PHYSICAL THERAPY DISCHARGE SUMMARY  Visits from Start of Care: 3   Current functional level related to goals / functional outcomes: See below   Remaining deficits: See goals   Education / Equipment: HEP  Plan: Patient agrees to discharge.  Patient goals were not met. Patient is being discharged due to not returning since the last visit.  ?????     Patient Details  Name: Andrea Martinez MRN: [id number] Date of Birth: [birth year]  Referring Provider (PT): Gaynelle Arabian MD   Encounter Date: 04/01/2020  PT End of Session - 04/01/20 1343    Visit Number  3    Number of Visits  12    Date for PT Re-Evaluation  05/06/20    Authorization Type  FOTO AT LEAST EVERY 5TH VISIT.  PROGRESS NOTE AT 10TH VISIT.  KX MODIFIER AFTER 15 VISITS.    PT Start Time  1345    PT Stop Time  1439    PT Time Calculation (min)  54 min    Equipment Utilized During Treatment  Other (comment)   Rolling walker   Activity Tolerance  Patient limited by pain    Behavior During Therapy  Anxious       Past Medical History:  Diagnosis Date  . Arthritis   . Chronic myelogenous leukemia (CML), BCR-ABL1-positive ([place]) 11/20/2015  . Gastric ulcer 2011   EGD, 5/11  . Hypothyroidism    not on meds, followed by Dr. Elyse Hsu  . Leukocytosis 11/20/2015  . Lipoma    left upper arm  . Pericardial effusion    a. HCAP complicated by pericardial effusion requiring pericardial window 10/2016 and large L pleural effusion requring VATS.  Marland Kitchen Pleural effusion    a. s/p VATS 2017.  Marland Kitchen Pneumonia 10/2016  . PONV (postoperative nausea and vomiting)   . Prolonged QT interval   . Urine incontinence   . UTI (lower urinary tract infection)    frequent    Past Surgical History:  Procedure Laterality Date  . BRAVO [place] STUDY  12/04/2012   Procedure: BRAVO [place];  Surgeon: [name], MD;  [facility];  Service: Endoscopy;  Laterality: N/A;  . CHOLECYSTECTOMY N/A 01/03/2014   Procedure: LAPAROSCOPIC CHOLECYSTECTOMY WITH INTRAOPERATIVE CHOLANGIOGRAM;  Surgeon: Joyice Faster. Cornett, MD;  Location: [place];  Service: General;  Laterality: N/A;  . COLONOSCOPY  01/2004   DR [facility], few small tics  . ESOPHAGOGASTRODUODENOSCOPY  02/2010   gastric ulcers  . ESOPHAGOGASTRODUODENOSCOPY  12/04/2012   [id number] gastritis (inflammation) was found in the gastric antrum; multiple biopsies The duodenal mucosa showed no abnormalities in the bulb and second portion of the duodenum  . JOINT REPLACEMENT Right 10/22/2019  . KYPHOPLASTY N/A 02/14/2019   Procedure: KYPHOPLASTY T12 and L1;  Surgeon: Melina Schools, MD;  Location: [place];  Service: Orthopedics;  Laterality: N/A;  120 mins  . LIPOMA EXCISION  08/02/2011   left shoulder  . NOSE SURGERY    . PARTIAL HYSTERECTOMY     vaginal at age 42 years of age  . TOE DEBRIDEMENT Left 1962   lt great toe  . TOTAL KNEE ARTHROPLASTY Right 10/22/2019   Procedure: TOTAL KNEE ARTHROPLASTY;  Surgeon: Gaynelle Arabian, MD;  [facility];  Service: Orthopedics;  Laterality: Right;  33mn  . TOTAL KNEE ARTHROPLASTY Left 03/03/2020   Procedure:  TOTAL KNEE ARTHROPLASTY;  Surgeon: Gaynelle Arabian, MD;  [facility];  Service: Orthopedics;  Laterality: Left;  23mn  . VIDEO ASSISTED THORACOSCOPY (VATS)/EMPYEMA Left 10/25/2016   Procedure: VIDEO ASSISTED THORACOSCOPY (VATS), BRONCH,DRAINAGE OF PLEURAL EFFUSION,PERICARDIAL WINDOW WITH DRAINAGE OF PERICARDIAL FLUID, TEE;  Surgeon: SMelrose Nakayama MD;  [place]  Service: Thoracic;  Laterality: Left;  .Marland KitchenVIDEO BRONCHOSCOPY N/A 10/25/2016   Procedure: VIDEO BRONCHOSCOPY;  Surgeon: SMelrose Nakayama MD;  [place]  Service: Thoracic;  Laterality: N/A;    There were no vitals filed for this visit.  Subjective Assessment - 04/01/20  1558    Subjective  COVID 19  screening performed on patient upon arrival. Reports not feeling pain, just sore 5/10.    Pertinent History  Leukemia, Kyphoplasty, OA.    How long can you stand comfortably?  Today, < two minutes.    How long can you walk comfortably?  See evaluation.    Patient Stated Goals  Get out of pain and walk.    Currently in Pain?  Yes    Pain Score  5     Pain Location  Knee    Pain Descriptors / Indicators  Aching;Sharp    Pain Type  Surgical pain    Pain Onset  1 to 4 weeks ago    Pain Frequency  Constant         [facility] PT Assessment - 04/01/20 0001      Assessment   Medical Diagnosis  Left total knee replacement.    Referring Provider (PT)  FGaynelle ArabianMD    Onset Date/Surgical Date  03/03/20    Next MD Visit  04/09/2020                   [facility]01[facility]20 0001      Exercises   Exercises  Knee/Hip      Knee/Hip Exercises: Aerobic   Nustep  L1, seat 9 to 7 x15 min      Knee/Hip Exercises: Seated   Long Arc Quad  AROM;Left;2 sets;10 reps    Hamstring Curl  Strengthening;2 sets;10 reps;Left    Hamstring Limitations  yellow theraband      Modalities   Modalities  Electrical Stimulation;Vasopneumatic      Electrical Stimulation   Electrical Stimulation Location  L knee    in sitting   Electrical Stimulation Action  IFC    Electrical Stimulation Parameters  80-150 hz x15 mins    Electrical Stimulation Goals  Edema;Pain      Vasopneumatic   Number Minutes Vasopneumatic   15 minutes    Vasopnuematic Location   Knee    Vasopneumatic Pressure  Low    Vasopneumatic Temperature   34 for edema/pain      Manual Therapy   Manual Therapy  Passive ROM    Passive ROM  gentle PROM to left knee into flexion in sitting with STW/M to anterior knee to decrease pain.                PT Short Term Goals - 03/25/20 1442      PT SHORT TERM GOAL #1   Title  Independent with an initial HEP.    Time  2     Period  Weeks    Status  New      PT SHORT TERM GOAL #2   Title  Full active left knee extension.    Time  2  Period  Weeks    Status  New        PT Long Term Goals - 03/25/20 1443      PT LONG TERM GOAL #1   Title  Left active knee flexion to 105-110 degrees.    Time  6    Period  Weeks    Status  New      PT LONG TERM GOAL #2   Title  Perform 4 stairs with one railing.    Time  6    Period  Weeks    Status  New      PT LONG TERM GOAL #3   Title  Left hip and knee strength to 4+/5 to increase stability for functional activities.    Time  6    Period  Weeks    Status  New            Plan - 04/01/20 1420    Clinical Impression Statement  Patient responded fairly to therapy session and with increasing soreness in left knee as session continued. Patient later reported she took a pain pill just before arriving to therapy. Patient instructed to try to take medication 30 mins to an hr prior to therapy to allow pain pill to get into system. Patient reported understanding. PROM performed in short sitting with verbal and tactile cuing to prevent hip hiking. No adverse affects upon removal of modalities.    Personal Factors and Comorbidities  Comorbidity 1;Comorbidity 2    Comorbidities  Leukemia, Kyphoplasty, OA.    Examination-Activity Limitations  Bathing;Bed Mobility;Locomotion Level;Other;Transfers;Toileting;Stand;Stairs    Examination-Participation Restrictions  Cleaning;Community Activity;Other    Stability/Clinical Decision Making  Stable/Uncomplicated    Rehab Potential  Good    PT Frequency  2x / week    PT Duration  6 weeks    PT Treatment/Interventions  ADLs/Self Care Home Management;Cryotherapy;Electrical Stimulation;Gait training;Stair training;Functional mobility training;Therapeutic activities;Therapeutic exercise;Neuromuscular re-education;Manual techniques;Patient/family education;Passive range of motion;Vasopneumatic Device    PT Next Visit Plan  Nustep,  PROM, AAROM, vasopneumatic/e'stim. MD follow up 04/09/2020    Consulted and Agree with Plan of Care  Patient       Patient will benefit from skilled therapeutic intervention in order to improve the following deficits and impairments:  Abnormal gait, Difficulty walking, Decreased activity tolerance, Decreased mobility, Decreased range of motion, Decreased strength, Increased edema, Pain  Visit Diagnosis: Chronic pain of left knee  Localized edema  Muscle weakness (generalized)     Problem List Patient Active Problem List   Diagnosis Date Noted  . Primary osteoarthritis of left knee 03/03/2020  . Thoracic compression fracture ([place]) 02/14/2019  . Sweating abnormality 10/11/2017  . Iron deficiency anemia 02/16/2017  . Pleural effusion 10/23/2016  . Solitary pulmonary nodule 10/23/2016  . Anemia 11/21/2015  . Bladder prolapse, female, acquired 11/21/2015  . IBS (irritable bowel syndrome) 11/21/2015  . Neutrophilic leukocytosis 03/47/4259  . OA (osteoarthritis) of knee 11/21/2015  . CML (chronic myeloid leukemia) ([place]) 11/20/2015  . Biliary dyskinesia 12/04/2013  . Dyspepsia 11/30/2012  . Rib pain 11/01/2011  . Colon cancer screening 09/21/2011  . Lipoma 07/27/2011  . Lipoma of arm 07/27/2011  . Mass of arm 07/08/2011  . PUD 05/20/2010  . ABDOMINAL PAIN RIGHT UPPER QUADRANT 02/12/2010  . Hypothyroidism 02/11/2010  . NAUSEA 02/11/2010  . UTI'S, HX OF 02/11/2010    Gabriela Eves,. PT, DPT 04/01/2020, 3:59 PM  [facility] [address] [place], [place], [address] Phone: [phone]   Fax:  [phone]  Name: Andrea Martinez MRN: [id number] Date of Birth: [birth year]

## 2020-04-03 ENCOUNTER — Ambulatory Visit: Payer: Medicare Other | Admitting: Physical Therapy

## 2020-04-08 ENCOUNTER — Ambulatory Visit: Payer: Medicare Other | Admitting: Physical Therapy

## 2020-04-08 ENCOUNTER — Other Ambulatory Visit (HOSPITAL_COMMUNITY): Payer: Self-pay | Admitting: *Deleted

## 2020-04-08 MED ORDER — NYSTATIN 100000 UNIT/GM EX CREA: 1.0000 "application " | TOPICAL_CREAM | Freq: Three times a day (TID) | CUTANEOUS | 2 refills | Status: DC

## 2020-04-08 NOTE — Telephone Encounter (Signed)
Patient called clinic reporting that her primary care physician is no longer in the office.  She is having some trouble with yeast infection on the external vagina and the insides of her legs.  Dr. Delton Coombes aware and orders received for nystatin cream use 3 times daily.  Patient is aware and verbalizes understanding and appreciation.

## 2020-04-10 ENCOUNTER — Ambulatory Visit: Payer: Medicare Other | Admitting: Physical Therapy

## 2020-04-14 ENCOUNTER — Ambulatory Visit: Payer: Medicare Other | Admitting: Physical Therapy

## 2020-04-18 DIAGNOSIS — Z96652 Presence of left artificial knee joint: Secondary | ICD-10-CM | POA: Diagnosis not present

## 2020-04-18 DIAGNOSIS — Z471 Aftercare following joint replacement surgery: Secondary | ICD-10-CM | POA: Diagnosis not present

## 2020-05-06 ENCOUNTER — Other Ambulatory Visit (HOSPITAL_COMMUNITY): Payer: Self-pay | Admitting: *Deleted

## 2020-05-06 DIAGNOSIS — C9210 Chronic myeloid leukemia, BCR/ABL-positive, not having achieved remission: Secondary | ICD-10-CM

## 2020-05-06 MED ORDER — NILOTINIB HCL 150 MG PO CAPS: 300.0000 mg | ORAL_CAPSULE | Freq: Two times a day (BID) | ORAL | 6 refills | Status: DC

## 2020-05-08 ENCOUNTER — Telehealth: Payer: Self-pay | Admitting: "Endocrinology

## 2020-05-08 DIAGNOSIS — E039 Hypothyroidism, unspecified: Secondary | ICD-10-CM

## 2020-05-08 NOTE — Telephone Encounter (Signed)
Lab orders updated

## 2020-05-08 NOTE — Telephone Encounter (Signed)
CAN YOU UPDATE LABS

## 2020-05-22 ENCOUNTER — Other Ambulatory Visit: Payer: Self-pay

## 2020-05-22 ENCOUNTER — Inpatient Hospital Stay (HOSPITAL_COMMUNITY): Payer: Medicare Other | Attending: Hematology

## 2020-05-22 DIAGNOSIS — M1990 Unspecified osteoarthritis, unspecified site: Secondary | ICD-10-CM | POA: Insufficient documentation

## 2020-05-22 DIAGNOSIS — Z801 Family history of malignant neoplasm of trachea, bronchus and lung: Secondary | ICD-10-CM | POA: Diagnosis not present

## 2020-05-22 DIAGNOSIS — C9210 Chronic myeloid leukemia, BCR/ABL-positive, not having achieved remission: Secondary | ICD-10-CM

## 2020-05-22 DIAGNOSIS — Z87440 Personal history of urinary (tract) infections: Secondary | ICD-10-CM | POA: Insufficient documentation

## 2020-05-22 DIAGNOSIS — Z79899 Other long term (current) drug therapy: Secondary | ICD-10-CM | POA: Insufficient documentation

## 2020-05-22 DIAGNOSIS — M545 Low back pain: Secondary | ICD-10-CM | POA: Insufficient documentation

## 2020-05-22 DIAGNOSIS — E039 Hypothyroidism, unspecified: Secondary | ICD-10-CM | POA: Diagnosis not present

## 2020-05-22 LAB — CBC WITH DIFFERENTIAL/PLATELET
Abs Immature Granulocytes: 0.07 10*3/uL (ref 0.00–0.07)
Basophils Absolute: 0 10*3/uL (ref 0.0–0.1)
Basophils Relative: 0 %
Eosinophils Absolute: 0.2 10*3/uL (ref 0.0–0.5)
Eosinophils Relative: 2 %
HCT: 37.3 % (ref 36.0–46.0)
Hemoglobin: 11.1 g/dL — ABNORMAL LOW (ref 12.0–15.0)
Immature Granulocytes: 1 %
Lymphocytes Relative: 11 %
Lymphs Abs: 1.3 10*3/uL (ref 0.7–4.0)
MCH: 25.9 pg — ABNORMAL LOW (ref 26.0–34.0)
MCHC: 29.8 g/dL — ABNORMAL LOW (ref 30.0–36.0)
MCV: 87.1 fL (ref 80.0–100.0)
Monocytes Absolute: 1 10*3/uL (ref 0.1–1.0)
Monocytes Relative: 8 %
Neutro Abs: 9.5 10*3/uL — ABNORMAL HIGH (ref 1.7–7.7)
Neutrophils Relative %: 78 %
Platelets: 345 10*3/uL (ref 150–400)
RBC: 4.28 MIL/uL (ref 3.87–5.11)
RDW: 14.8 % (ref 11.5–15.5)
WBC: 12.1 10*3/uL — ABNORMAL HIGH (ref 4.0–10.5)
nRBC: 0 % (ref 0.0–0.2)

## 2020-05-22 LAB — COMPREHENSIVE METABOLIC PANEL
ALT: 19 U/L (ref 0–44)
AST: 29 U/L (ref 15–41)
Albumin: 3.9 g/dL (ref 3.5–5.0)
Alkaline Phosphatase: 153 U/L — ABNORMAL HIGH (ref 38–126)
Anion gap: 10 (ref 5–15)
BUN: 25 mg/dL — ABNORMAL HIGH (ref 8–23)
CO2: 24 mmol/L (ref 22–32)
Calcium: 8.9 mg/dL (ref 8.9–10.3)
Chloride: 101 mmol/L (ref 98–111)
Creatinine, Ser: 0.89 mg/dL (ref 0.44–1.00)
GFR calc Af Amer: >60 mL/min (ref >60)
GFR calc non Af Amer: >60 mL/min (ref >60)
Glucose, Bld: 111 mg/dL — ABNORMAL HIGH (ref 70–99)
Potassium: 4.5 mmol/L (ref 3.5–5.1)
Sodium: 135 mmol/L (ref 135–145)
Total Bilirubin: 0.6 mg/dL (ref 0.3–1.2)
Total Protein: 8.3 g/dL — ABNORMAL HIGH (ref 6.5–8.1)

## 2020-05-22 LAB — LACTATE DEHYDROGENASE: LDH: 141 U/L (ref 98–192)

## 2020-05-26 DIAGNOSIS — E039 Hypothyroidism, unspecified: Secondary | ICD-10-CM | POA: Diagnosis not present

## 2020-05-26 LAB — T4, FREE: Free T4: 1.5 ng/dL (ref 0.8–1.8)

## 2020-05-26 LAB — TSH: TSH: 0.9 mIU/L (ref 0.40–4.50)

## 2020-05-28 LAB — BCR-ABL1, CML/ALL, PCR, QUANT: Interpretation (BCRAL):: NEGATIVE

## 2020-05-29 ENCOUNTER — Encounter (HOSPITAL_COMMUNITY): Payer: Self-pay | Admitting: Hematology

## 2020-05-29 ENCOUNTER — Inpatient Hospital Stay (HOSPITAL_BASED_OUTPATIENT_CLINIC_OR_DEPARTMENT_OTHER): Payer: Medicare Other | Admitting: Hematology

## 2020-05-29 ENCOUNTER — Other Ambulatory Visit: Payer: Self-pay

## 2020-05-29 VITALS — BP 133/50 | HR 82 | Temp 97.4°F | Resp 19 | Wt 218.6 lb

## 2020-05-29 DIAGNOSIS — C9210 Chronic myeloid leukemia, BCR/ABL-positive, not having achieved remission: Secondary | ICD-10-CM | POA: Diagnosis not present

## 2020-05-29 DIAGNOSIS — M545 Low back pain: Secondary | ICD-10-CM | POA: Diagnosis not present

## 2020-05-29 DIAGNOSIS — M1990 Unspecified osteoarthritis, unspecified site: Secondary | ICD-10-CM | POA: Diagnosis not present

## 2020-05-29 DIAGNOSIS — Z801 Family history of malignant neoplasm of trachea, bronchus and lung: Secondary | ICD-10-CM | POA: Diagnosis not present

## 2020-05-29 DIAGNOSIS — Z87440 Personal history of urinary (tract) infections: Secondary | ICD-10-CM | POA: Diagnosis not present

## 2020-05-29 DIAGNOSIS — E039 Hypothyroidism, unspecified: Secondary | ICD-10-CM | POA: Diagnosis not present

## 2020-05-29 DIAGNOSIS — Z79899 Other long term (current) drug therapy: Secondary | ICD-10-CM | POA: Diagnosis not present

## 2020-05-29 NOTE — Patient Instructions (Signed)
[place] at [facility] Discharge Instructions  You were seen today by Dr. Delton Coombes. He went over your recent results. Continue your routine care with your primary care provider. Dr. Delton Coombes will see you back in 3 months for labs and follow up.   Thank you for choosing [place] at [facility] to provide your oncology and hematology care.  To afford each patient quality time with our provider, please arrive at least 15 minutes before your scheduled appointment time.   If you have a lab appointment with the Campus please come in thru the Main Entrance and check in at the main information desk  You need to re-schedule your appointment should you arrive 10 or more minutes late.  We strive to give you quality time with our providers, and arriving late affects you and other patients whose appointments are after yours.  Also, if you no show three or more times for appointments you may be dismissed from the clinic at the providers discretion.     Again, thank you for choosing [facility].  Our hope is that these requests will decrease the amount of time that you wait before being seen by our physicians.       _____________________________________________________________  Should you have questions after your visit to [facility], please contact our office at [phone] between the hours of 8:00 a.m. and 4:30 p.m.  Voicemails left after 4:00 p.m. will not be returned until the following business day.  For prescription refill requests, have your pharmacy contact our office and allow 72 hours.    Cancer Center Support Programs:   > Cancer Support Group  2nd Tuesday of the month 1pm-2pm, Journey Room

## 2020-05-29 NOTE — Progress Notes (Signed)
[place], [place] [address]   CLINIC:  Medical Oncology/Hematology  PCP:  Loman [place], [place] [address] / [place] [address]  [phone]  REASON FOR VISIT:  Follow-up for [facility]  PRIOR THERAPY: Nilotinib  CURRENT THERAPY: Tasigna  INTERVAL HISTORY:  [name], a 74 y.o. female, returns for routine follow-up for her CML. Andrea Martinez was last seen on 02/28/2020.  Today she is accompanied by her husband. She will have an MRI of her back on 05/30/2020; her surgeon is Dr. Wynelle Link. She is still ambulating with a walker. She is tolerating the Tasigna well and denies N/V.   REVIEW OF SYSTEMS:  Review of Systems  Constitutional: Positive for fatigue (moderate). Negative for appetite change.  Gastrointestinal: Negative for nausea and vomiting.  Musculoskeletal: Positive for arthralgias (10/10 L hip).  Psychiatric/Behavioral: Positive for sleep disturbance.  All other systems reviewed and are negative.   PAST MEDICAL/SURGICAL HISTORY:  Past Medical History:  Diagnosis Date  . Arthritis   . Chronic myelogenous leukemia (CML), BCR-ABL1-positive ([place]) 11/20/2015  . Gastric ulcer 2011   EGD, 5/11  . Hypothyroidism    not on meds, followed by Dr. Elyse Hsu  . Leukocytosis 11/20/2015  . Lipoma    left upper arm  . Pericardial effusion    a. HCAP complicated by pericardial effusion requiring pericardial window 10/2016 and large L pleural effusion requring VATS.  Marland Kitchen Pleural effusion    a. s/p VATS 2017.  Marland Kitchen Pneumonia 10/2016  . PONV (postoperative nausea and vomiting)   . Prolonged QT interval   . Urine incontinence   . UTI (lower urinary tract infection)    frequent   Past Surgical History:  Procedure Laterality Date  . BRAVO [place] STUDY  12/04/2012   Procedure: BRAVO [place];  Surgeon: [name], MD;  [facility];  Service: Endoscopy;  Laterality: N/A;  . CHOLECYSTECTOMY N/A 01/03/2014   Procedure: LAPAROSCOPIC  CHOLECYSTECTOMY WITH INTRAOPERATIVE CHOLANGIOGRAM;  Surgeon: Joyice Faster. Cornett, MD;  Location: [place];  Service: General;  Laterality: N/A;  . COLONOSCOPY  01/2004   DR [facility], few small tics  . ESOPHAGOGASTRODUODENOSCOPY  02/2010   gastric ulcers  . ESOPHAGOGASTRODUODENOSCOPY  12/04/2012   IZT:[id number] gastritis (inflammation) was found in the gastric antrum; multiple biopsies The duodenal mucosa showed no abnormalities in the bulb and second portion of the duodenum  . JOINT REPLACEMENT Right 10/22/2019  . KYPHOPLASTY N/A 02/14/2019   Procedure: KYPHOPLASTY T12 and L1;  Surgeon: Melina Schools, MD;  Location: [place];  Service: Orthopedics;  Laterality: N/A;  120 mins  . LIPOMA EXCISION  08/02/2011   left shoulder  . NOSE SURGERY    . PARTIAL HYSTERECTOMY     vaginal at age 46 years of age  . TOE DEBRIDEMENT Left 1962   lt great toe  . TOTAL KNEE ARTHROPLASTY Right 10/22/2019   Procedure: TOTAL KNEE ARTHROPLASTY;  Surgeon: Gaynelle Arabian, MD;  Location: [facility];  Service: Orthopedics;  Laterality: Right;  7min  . TOTAL KNEE ARTHROPLASTY Left 03/03/2020   Procedure: TOTAL KNEE ARTHROPLASTY;  Surgeon: Gaynelle Arabian, MD;  Location: [facility];  Service: Orthopedics;  Laterality: Left;  76min  . VIDEO ASSISTED THORACOSCOPY (VATS)/EMPYEMA Left 10/25/2016   Procedure: VIDEO ASSISTED THORACOSCOPY (VATS), BRONCH,DRAINAGE OF PLEURAL EFFUSION,PERICARDIAL WINDOW WITH DRAINAGE OF PERICARDIAL FLUID, TEE;  Surgeon: [name], MD;  Location: [place];  Service: Thoracic;  Laterality: Left;  Marland Kitchen VIDEO  BRONCHOSCOPY N/A 10/25/2016   Procedure: VIDEO BRONCHOSCOPY;  Surgeon: [name], MD;  Location: [place];  Service: Thoracic;  Laterality: N/A;    SOCIAL HISTORY:  Social History   Socioeconomic History  . Marital status: Married    Spouse name: Not on file  . Number of children: 2  . Years of education: 12th grade  . Highest education level: Not on file  Occupational History    . Occupation: retired    Fish farm manager: UENPLOYED    Employer: RETIRED  Tobacco Use  . Smoking status: Never Smoker  . Smokeless tobacco: Never Used  Vaping Use  . Vaping Use: Never used  Substance and Sexual Activity  . Alcohol use: No    Alcohol/week: 0.0 standard drinks  . Drug use: No  . Sexual activity: Yes    Partners: Male    Birth control/protection: None  Other Topics Concern  . Not on file  Social History Narrative  . Not on file   Social Determinants of Health   Financial Resource Strain:   . Difficulty of Paying Living Expenses:   Food Insecurity:   . Worried About Charity fundraiser in the Last Year:   . Arboriculturist in the Last Year:   Transportation Needs:   . Film/video editor (Medical):   Marland Kitchen Lack of Transportation (Non-Medical):   Physical Activity:   . Days of Exercise per Week:   . Minutes of Exercise per Session:   Stress:   . Feeling of Stress :   Social Connections:   . Frequency of Communication with Friends and Family:   . Frequency of Social Gatherings with Friends and Family:   . Attends Religious Services:   . Active Member of Clubs or Organizations:   . Attends Archivist Meetings:   Marland Kitchen Marital Status:   Intimate Partner Violence:   . Fear of Current or Ex-Partner:   . Emotionally Abused:   Marland Kitchen Physically Abused:   . Sexually Abused:     FAMILY HISTORY:  Family History  Problem Relation Age of Onset  . Lung cancer Father   . COPD Mother   . Colon cancer Neg Hx   . Anesthesia problems Neg Hx   . Hypotension Neg Hx   . Malignant hyperthermia Neg Hx   . Pseudochol deficiency Neg Hx     CURRENT MEDICATIONS:  Current Outpatient Medications  Medication Sig Dispense Refill  . acetaminophen (TYLENOL) 500 MG tablet Take 1,000 mg by mouth every 6 (six) hours as needed for mild pain or moderate pain.     . cetirizine (ZYRTEC) 10 MG tablet Take 10 mg by mouth at bedtime.     Marland Kitchen escitalopram (LEXAPRO) 20 MG tablet Take 1.5  tablets (30 mg total) by mouth daily. 60 tablet 5  . Lactobacillus-Inulin (PROBIOTIC DIGESTIVE SUPPORT PO) Take 2 tablets by mouth at bedtime. Gummie    . levothyroxine (SYNTHROID) 125 MCG tablet TAKE 1 TABLET (125 MCG TOTAL) BY MOUTH DAILY BEFORE BREAKFAST. 90 tablet 1  . multivitamin-iron-minerals-folic acid (CENTRUM) chewable tablet Chew 1 tablet by mouth daily.    . nilotinib (TASIGNA) 150 MG capsule Take 2 capsules (300 mg total) by mouth every 12 (twelve) hours. 120 capsule 6  . traMADol (ULTRAM) 50 MG tablet Take 50 mg by mouth 4 (four) times daily as needed.    . vitamin B-12 (CYANOCOBALAMIN) 1000 MCG tablet Take 1,000 mcg by mouth daily.    . vitamin E 180 MG (  400 UNITS) capsule Take 400 Units by mouth daily.     No current facility-administered medications for this visit.    ALLERGIES:  Allergies  Allergen Reactions  . Codeine Nausea Only and Other (See Comments)    Headache  . Sulfonamide Derivatives Nausea Only and Other (See Comments)    Headache  . Esomeprazole Magnesium Nausea Only  . Fluconazole Rash    Redness and blistering on left thigh    PHYSICAL EXAM:  Performance status (ECOG): 2 - Symptomatic, <50% confined to bed  Vitals:   05/29/20 1528  BP: (!) 133/50  Pulse: 82  Resp: 19  Temp: (!) 97.4 F (36.3 C)  SpO2: 100%   Wt Readings from Last 3 Encounters:  05/29/20 218 lb 9.6 oz (99.2 kg)  03/03/20 222 lb 3.6 oz (100.8 kg)  02/28/20 222 lb 4.8 oz (100.8 kg)   Physical Exam Vitals reviewed.  Constitutional:      Appearance: Normal appearance. She is obese.  Cardiovascular:     Rate and Rhythm: Normal rate and regular rhythm.     Pulses: Normal pulses.     Heart sounds: Normal heart sounds.  Pulmonary:     Effort: Pulmonary effort is normal.     Breath sounds: Normal breath sounds.  Musculoskeletal:     Lumbar back: Bony tenderness present.  Neurological:     General: No focal deficit present.     Mental Status: She is alert and oriented to  person, place, and time.  Psychiatric:        Mood and Affect: Mood normal.        Behavior: Behavior normal.     LABORATORY DATA:  I have reviewed the labs as listed.  CBC Latest Ref Rng & Units 05/22/2020 03/04/2020 02/22/2020  WBC 4.0 - 10.5 K/uL 12.1(H) 16.6(H) 12.1(H)  Hemoglobin 12.0 - 15.0 g/dL 11.1(L) 9.8(L) 11.6(L)  Hematocrit 36 - 46 % 37.3 32.6(L) 38.5  Platelets 150 - 400 K/uL 345 232 314   CMP Latest Ref Rng & Units 05/22/2020 03/04/2020 02/22/2020  Glucose 70 - 99 mg/dL 111(H) 167(H) 103(H)  BUN 8 - 23 mg/dL 25(H) 24(H) 22  Creatinine 0.44 - 1.00 mg/dL 0.89 0.92 0.85  Sodium 135 - 145 mmol/L 135 136 141  Potassium 3.5 - 5.1 mmol/L 4.5 5.3(H) 4.2  Chloride 98 - 111 mmol/L 101 104 106  CO2 22 - 32 mmol/L 24 23 25   Calcium 8.9 - 10.3 mg/dL 8.9 8.6(L) 9.2  Total Protein 6.5 - 8.1 g/dL 8.3(H) - 7.9  Total Bilirubin 0.3 - 1.2 mg/dL 0.6 - 0.7  Alkaline Phos 38 - 126 U/L 153(H) - 150(H)  AST 15 - 41 U/L 29 - 25  ALT 0 - 44 U/L 19 - 17      Component Value Date/Time   RBC 4.28 05/22/2020 1358   MCV 87.1 05/22/2020 1358   MCH 25.9 (L) 05/22/2020 1358   MCHC 29.8 (L) 05/22/2020 1358   RDW 14.8 05/22/2020 1358   LYMPHSABS 1.3 05/22/2020 1358   MONOABS 1.0 05/22/2020 1358   EOSABS 0.2 05/22/2020 1358   BASOSABS 0.0 05/22/2020 1358   Lab Results  Component Value Date   LDH 141 05/22/2020   LDH 121 04/24/2019   LDH 156 10/31/2018    DIAGNOSTIC IMAGING:  I have independently reviewed the scans and discussed with the patient.   ASSESSMENT:  1.  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 -Left pleural effusion and pericardial effusion requiring pericardial window  on 10/25/2016.  Fluid cytology negative. -Started back on Nilotinib 300 mg every 12 hours on 12/06/2016.  2.  Daytime sweats: -He had a long history of daytime sweats.  She had extensive work-up including a CT scan CAP, which showed no evidence of malignancy.  Hepatic morphology suspicious for  cirrhosis.   PLAN:  1.  CML in chronic phase: -She is tolerating Nilotinib very well. -I reviewed BCR/ABL by quantitative PCR on 05/22/2020 which was negative. -CBC shows mildly elevated white count of 12.1 on 05/22/2020.  Neutrophil count was elevated.  LFTs were normal. -I have recommended follow-up in 3 months with repeat labs and BCR/ABL.  2.  Low back pain: -She recently underwent left knee replacement.  Previously she had right knee replacement. -She is complaining of some back pain going down the left hip.  She is being scheduled for MRI of the back.  Orders placed this encounter:  No orders of the defined types were placed in this encounter.    Derek Jack, MD [place] [phone]   I, Milinda Antis, am acting as a scribe for Dr. Sanda Linger.  I, Derek Jack MD, have reviewed the above documentation for accuracy and completeness, and I agree with the above.

## 2020-05-30 DIAGNOSIS — M545 Low back pain: Secondary | ICD-10-CM | POA: Diagnosis not present

## 2020-06-02 ENCOUNTER — Encounter: Payer: Self-pay | Admitting: "Endocrinology

## 2020-06-02 ENCOUNTER — Other Ambulatory Visit: Payer: Self-pay

## 2020-06-02 ENCOUNTER — Ambulatory Visit: Payer: Medicare Other | Admitting: "Endocrinology

## 2020-06-02 VITALS — BP 146/76 | HR 87 | Ht 65.0 in | Wt 217.8 lb

## 2020-06-02 DIAGNOSIS — E039 Hypothyroidism, unspecified: Secondary | ICD-10-CM

## 2020-06-02 MED ORDER — LEVOTHYROXINE SODIUM 125 MCG PO TABS: 125.0000 ug | ORAL_TABLET | Freq: Every day | ORAL | 3 refills | Status: DC

## 2020-06-02 NOTE — Progress Notes (Signed)
06/02/2020     Endocrinology follow-up note   Subjective:    Patient ID: Andrea Martinez, female    DOB: 12/10/1946, PCP Loman Brooklyn, FNP   Past Medical History:  Diagnosis Date  . Arthritis   . Chronic myelogenous leukemia (CML), BCR-ABL1-positive ([employer]) 11/20/2015  . Gastric ulcer 2011   EGD, 5/11  . Hypothyroidism    not on meds, followed by Dr. Elyse Hsu  . Leukocytosis 11/20/2015  . Lipoma    left upper arm  . Pericardial effusion    a. HCAP complicated by pericardial effusion requiring pericardial window 10/2016 and large L pleural effusion requring VATS.  Marland Kitchen Pleural effusion    a. s/p VATS 2017.  Marland Kitchen Pneumonia 10/2016  . PONV (postoperative nausea and vomiting)   . Prolonged QT interval   . Urine incontinence   . UTI (lower urinary tract infection)    frequent   Past Surgical History:  Procedure Laterality Date  . BRAVO [place] STUDY  12/04/2012   Procedure: BRAVO [place];  Surgeon: [name], MD;  [facility];  Service: Endoscopy;  Laterality: N/A;  . CHOLECYSTECTOMY N/A 01/03/2014   Procedure: LAPAROSCOPIC CHOLECYSTECTOMY WITH INTRAOPERATIVE CHOLANGIOGRAM;  Surgeon: Joyice Faster. Cornett, MD;  Location: [place];  Service: General;  Laterality: N/A;  . COLONOSCOPY  01/2004   [facility], few small tics  . ESOPHAGOGASTRODUODENOSCOPY  02/2010   gastric ulcers  . ESOPHAGOGASTRODUODENOSCOPY  12/04/2012   OEU:MPNTIRW gastritis (inflammation) was found in the gastric antrum; multiple biopsies The duodenal mucosa showed no abnormalities in the bulb and second portion of the duodenum  . JOINT REPLACEMENT Right 10/22/2019  . KYPHOPLASTY N/A 02/14/2019   Procedure: KYPHOPLASTY T12 and L1;  Surgeon: Melina Schools, MD;  Location: [place];  Service: Orthopedics;  Laterality: N/A;  120 mins  . LIPOMA EXCISION  08/02/2011   left shoulder  . NOSE SURGERY    . PARTIAL HYSTERECTOMY     vaginal at age 32 years of age  . TOE DEBRIDEMENT Left 1962   lt great  toe  . TOTAL KNEE ARTHROPLASTY Right 10/22/2019   Procedure: TOTAL KNEE ARTHROPLASTY;  Surgeon: Gaynelle Arabian, MD;  Location: [facility];  Service: Orthopedics;  Laterality: Right;  68mn  . TOTAL KNEE ARTHROPLASTY Left 03/03/2020   Procedure: TOTAL KNEE ARTHROPLASTY;  Surgeon: AGaynelle Arabian MD;  Location: [facility];  Service: Orthopedics;  Laterality: Left;  555m  . VIDEO ASSISTED THORACOSCOPY (VATS)/EMPYEMA Left 10/25/2016   Procedure: VIDEO ASSISTED THORACOSCOPY (VATS), BRONCH,DRAINAGE OF PLEURAL EFFUSION,PERICARDIAL WINDOW WITH DRAINAGE OF PERICARDIAL FLUID, TEE;  Surgeon: StMelrose NakayamaMD;  [place] [facility] Service: Thoracic;  Laterality: Left;  . Marland KitchenIDEO BRONCHOSCOPY N/A 10/25/2016   Procedure: VIDEO BRONCHOSCOPY;  Surgeon: StMelrose NakayamaMD;  [place] [facility] Service: Thoracic;  Laterality: N/A;   Social History   Socioeconomic History  . Marital status: Married    Spouse name: Not on file  . Number of children: 2  . Years of education: 12th grade  . Highest education level: Not on file  Occupational History  . 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UENPLOYED    Employer: RETIRED  Tobacco Use  . Smoking status: Never Smoker  . Smokeless tobacco: Never Used  Vaping Use  . Vaping Use: Never used  Substance and Sexual Activity  . Alcohol use: No    Alcohol/week: 0.0 standard drinks  . Drug use: No  . Sexual activity: Yes  Partners: Male    Birth control/protection: None  Other Topics Concern  . Not on file  Social History Narrative  . Not on file   Social Determinants of Health   Financial Resource Strain:   . Difficulty of Paying Living Expenses:   Food Insecurity:   . Worried About Charity fundraiser in the Last Year:   . Arboriculturist in the Last Year:   Transportation Needs:   . Film/video editor (Medical):   Marland Kitchen Lack of Transportation (Non-Medical):   Physical Activity:   . Days of Exercise per Week:   . Minutes of Exercise per Session:    Stress:   . Feeling of Stress :   Social Connections:   . Frequency of Communication with Friends and Family:   . Frequency of Social Gatherings with Friends and Family:   . Attends Religious Services:   . Active Member of Clubs or Organizations:   . Attends Archivist Meetings:   Marland Kitchen Marital Status:    Outpatient Encounter Medications as of 06/02/2020  Medication Sig  . acetaminophen (TYLENOL) 500 MG tablet Take 1,000 mg by mouth every 6 (six) hours as needed for mild pain or moderate pain.   . cetirizine (ZYRTEC) 10 MG tablet Take 10 mg by mouth at bedtime.   Marland Kitchen escitalopram (LEXAPRO) 20 MG tablet Take 1.5 tablets (30 mg total) by mouth daily.  . Lactobacillus-Inulin (PROBIOTIC DIGESTIVE SUPPORT PO) Take 2 tablets by mouth at bedtime. Gummie  . levothyroxine (SYNTHROID) 125 MCG tablet Take 1 tablet (125 mcg total) by mouth daily before breakfast.  . multivitamin-iron-minerals-folic acid (CENTRUM) chewable tablet Chew 1 tablet by mouth daily.  . nilotinib (TASIGNA) 150 MG capsule Take 2 capsules (300 mg total) by mouth every 12 (twelve) hours.  . traMADol (ULTRAM) 50 MG tablet Take 50 mg by mouth 4 (four) times daily as needed.  . vitamin B-12 (CYANOCOBALAMIN) 1000 MCG tablet Take 1,000 mcg by mouth daily.  . vitamin E 180 MG (400 UNITS) capsule Take 400 Units by mouth daily.  . [DISCONTINUED] levothyroxine (SYNTHROID) 125 MCG tablet TAKE 1 TABLET (125 MCG TOTAL) BY MOUTH DAILY BEFORE BREAKFAST.   No facility-administered encounter medications on file as of 06/02/2020.   ALLERGIES: Allergies  Allergen Reactions  . Codeine Nausea Only and Other (See Comments)    Headache  . Sulfonamide Derivatives Nausea Only and Other (See Comments)    Headache  . Esomeprazole Magnesium Nausea Only  . Fluconazole Rash    Redness and blistering on left thigh    VACCINATION STATUS: Immunization History  Administered Date(s) Administered  . Influenza,inj,Quad PF,6+ Mos 09/13/2017  .  Influenza-Unspecified 09/24/2015, 11/24/2017  . Pneumococcal Conjugate-13 10/09/2014  . Pneumococcal Polysaccharide-23 10/09/2014    HPI 74 year old female patient with medical history as above. She is being seen in follow-up for hypothyroidism.  She is currently on levothyroxine 125 mcg daily before breakfast.  She is reporting compliance to this medication.   -She is recovering from recent bilateral knee surgeries.  She still walks with a walker. -She has lost 10 pounds overall since last visit.  She denies palpitations, tremors, nor heat intolerance.   She is still on nilotinib for CML.    -She has family history of thyroid problem involving goiter which required thyroidectomy in her mother who took thyroid hormone replacement subsequently. -She denies dysphagia, shortness of breath, voice change. -She is compliant to her medications.  Review of Systems Limited as above.  Objective:  BP (!) 146/76   Pulse 87   Ht '5\' 5"'  (1.651 m)   Wt 217 lb 12.8 oz (98.8 kg)   BMI 36.24 kg/m   Wt Readings from Last 3 Encounters:  06/02/20 217 lb 12.8 oz (98.8 kg)  05/29/20 218 lb 9.6 oz (99.2 kg)  03/03/20 222 lb 3.6 oz (100.8 kg)     Physical Exam- Limited  Constitutional:  Body mass index is 36.24 kg/m. , not in acute distress, normal state of mind Eyes:  EOMI, no exophthalmos Neck: Supple Thyroid: No gross goiter Respiratory: Adequate breathing efforts Musculoskeletal: +, She walks with a walker, no gross deformities, strength intact in all four extremities, no gross restriction of joint movements Skin:  no rashes, no hyperemia Neurological: no tremor with outstretched hands,    CMP     Component Value Date/Time   NA 135 05/22/2020 1358   K 4.5 05/22/2020 1358   CL 101 05/22/2020 1358   CO2 24 05/22/2020 1358   GLUCOSE 111 (H) 05/22/2020 1358   BUN 25 (H) 05/22/2020 1358   CREATININE 0.89 05/22/2020 1358   CREATININE 0.87 07/14/2011 0938   CALCIUM 8.9 05/22/2020 1358    PROT 8.3 (H) 05/22/2020 1358   ALBUMIN 3.9 05/22/2020 1358   AST 29 05/22/2020 1358   ALT 19 05/22/2020 1358   ALKPHOS 153 (H) 05/22/2020 1358   BILITOT 0.6 05/22/2020 1358   GFRNONAA >60 05/22/2020 1358   GFRAA >60 05/22/2020 1358   Diabetic Labs (most recent): Lab Results  Component Value Date   HGBA1C 5.3 04/24/2019   HGBA1C 5.3 10/28/2016   Recent Results (from the past 2160 hour(s))  CBC with Differential/Platelet     Status: Abnormal   Collection Time: 05/22/20  1:58 PM  Result Value Ref Range   WBC 12.1 (H) 4.0 - 10.5 K/uL   RBC 4.28 3.87 - 5.11 MIL/uL   Hemoglobin 11.1 (L) 12.0 - 15.0 g/dL   HCT 37.3 36 - 46 %   MCV 87.1 80.0 - 100.0 fL   MCH 25.9 (L) 26.0 - 34.0 pg   MCHC 29.8 (L) 30.0 - 36.0 g/dL   RDW 14.8 11.5 - 15.5 %   Platelets 345 150 - 400 K/uL   nRBC 0.0 0.0 - 0.2 %   Neutrophils Relative % 78 %   Neutro Abs 9.5 (H) 1.7 - 7.7 K/uL   Lymphocytes Relative 11 %   Lymphs Abs 1.3 0.7 - 4.0 K/uL   Monocytes Relative 8 %   Monocytes Absolute 1.0 0 - 1 K/uL   Eosinophils Relative 2 %   Eosinophils Absolute 0.2 0 - 0 K/uL   Basophils Relative 0 %   Basophils Absolute 0.0 0 - 0 K/uL   Immature Granulocytes 1 %   Abs Immature Granulocytes 0.07 0.00 - 0.07 K/uL    Comment: Performed at [facility], [address], [place], [place] [address]  Comprehensive metabolic panel     Status: Abnormal   Collection Time: 05/22/20  1:58 PM  Result Value Ref Range   Sodium 135 135 - 145 mmol/L   Potassium 4.5 3.5 - 5.1 mmol/L   Chloride 101 98 - 111 mmol/L   CO2 24 22 - 32 mmol/L   Glucose, Bld 111 (H) 70 - 99 mg/dL    Comment: Glucose reference range applies only to samples taken after fasting for at least 8 hours.   BUN 25 (H) 8 - 23 mg/dL   Creatinine, Ser 0.89 0.44 - 1.00 mg/dL  Calcium 8.9 8.9 - 10.3 mg/dL   Total Protein 8.3 (H) 6.5 - 8.1 g/dL   Albumin 3.9 3.5 - 5.0 g/dL   AST 29 15 - 41 U/L   ALT 19 0 - 44 U/L   Alkaline Phosphatase 153 (H) 38 - 126 U/L    Total Bilirubin 0.6 0.3 - 1.2 mg/dL   GFR calc non Af Amer >60 >60 mL/min   GFR calc Af Amer >60 >60 mL/min   Anion gap 10 5 - 15    Comment: Performed at [facility], [address] [place], [place] [address]  Lactate dehydrogenase     Status: None   Collection Time: 05/22/20  1:58 PM  Result Value Ref Range   LDH 141 98 - 192 U/L    Comment: Performed at [facility], [address] [place], [place] [address]  BCR-ABL1, CML/ALL, PCR, QUANT     Status: None   Collection Time: 05/22/20  1:58 PM  Result Value Ref Range   b2a2 transcript Comment %    Comment:            <0.0032 %   b3a2 transcript Comment %    Comment:            <0.0032 %   E1A2 Transcript Comment %    Comment:            <0.0032 %   Interpretation (BCRAL): Negative     Comment: (NOTE) NEGATIVE for the BCR-ABL1 e1a2 (p190), e13a2 (b2a2, p210) and e14a2 (b3a2, p210) fusion transcripts. These results do not rule out the presence of rare BCR-ABL1 transcripts not detected by this assay.    Director Review [facility]): Comment     Comment: (NOTE) Constance Goltz, PhD, [facility]               Director, [place] [facility] [place] and [place], [place]    Background: Comment     Comment: (NOTE) This assay can detect three different types of BCR-ABL1 fusion transcripts associated with CML, ALL, and AML: e13a2 (previously b2a2) and e14a2 (previously b3a2) (major breakpoint, p210), as well as e1a2 (minor breakpoint, p190). The e13a2 and e14a2 transcript values are titrated to the current International Scale (IS). The standardized baseline is 100% BCR-ABL1 (IS) and major molecular response (MMR) is equivalent to 0.1% BCR-ABL1 (IS) corresponding to a 3-log reduction. Results should be correlated with appropriate clinical and laboratory information as indicated.    Methodology Comment     Comment: (NOTE) Total  RNA is isolated from the sample and subject to a real-time, reverse transcriptase polymerase chain reaction (RT-PCR). The PCR primers and probes are specific for BCR-ABL1 e13a2, e14a2 and e1a2 fusion transcripts. The ABL1 transcript is amplified as the control for cDNA quantity and quality. Serial dilutions of a validated positive control RNA with known t(9;22) BCR-ABL1 are used as reference for quantification of BCR-ABL1 relative to ABL1. The numeric BCR-ABL1 level is reported as % BCR-ABL1/ABL1 and the detection sensitivity is 4.5 log below the standard baseline (<0.0032%). This test was developed and its performance characteristics determined by [facility]. It has not been cleared or approved by  the Food and Drug Administration. Performed At: [facility] [address] [place], [place] [id number] Katina Degree MDPhD [id number] Performed At: [facility] [address] [place], [place] [id number] Katina Degree MDPhD [id number]   T4, Free     Status: None   Collection Time: 05/26/20  1:49 PM  Result Value Ref Range   Free T4 1.5 0.8 - 1.8 ng/dL  TSH     Status: None   Collection Time: 05/26/20  1:49 PM  Result Value Ref Range   TSH 0.90 0.40 - 4.50 mIU/L      Assessment & Plan:   1. Hypothyroidism - Her previsit thyroid function tests are consistent with appropriate replacement.   -She is advised to continue on levothyroxine 125 g by mouth every morning.   - We discussed about the correct intake of her thyroid hormone, on empty stomach at fasting, with water, separated by at least 30 minutes from breakfast and other medications,  and separated by more than 4 hours from calcium, iron, multivitamins, acid reflux medications (PPIs). -Patient is made aware of the fact that thyroid hormone replacement is needed for life, dose to be adjusted by periodic monitoring of thyroid function tests.   - Her clinical exam has been  negative for goiter, hence no need for imaging of  the thyroid at this time. - I advised patient to maintain close follow up with Loman Brooklyn, FNP for primary care needs.     - Time spent on this patient care encounter:  20 minutes of which 50% was spent in  counseling and the rest reviewing  her current and  previous labs / studies and medications  doses and developing a plan for long term care. [name]  participated in the discussions, expressed understanding, and voiced agreement with the above plans.  All questions were answered to her satisfaction. she is encouraged to contact clinic should she have any questions or concerns prior to her return visit.   Follow up plan: Return in about 1 year (around 06/02/2021) for F/U with Pre-visit Labs.  Glade Lloyd, MD Phone: [phone]  Fax: [phone]  -  This note was partially dictated with voice recognition software. Similar sounding words can be transcribed inadequately or may not  be corrected upon review.  06/02/2020, 6:02 PM

## 2020-06-10 ENCOUNTER — Ambulatory Visit (INDEPENDENT_AMBULATORY_CARE_PROVIDER_SITE_OTHER): Payer: Medicare Other | Admitting: Family Medicine

## 2020-06-10 ENCOUNTER — Other Ambulatory Visit: Payer: Self-pay

## 2020-06-10 ENCOUNTER — Encounter: Payer: Self-pay | Admitting: Family Medicine

## 2020-06-10 VITALS — BP 126/79 | HR 95 | Temp 97.0°F | Ht 65.0 in | Wt 218.0 lb

## 2020-06-10 DIAGNOSIS — N8110 Cystocele, unspecified: Secondary | ICD-10-CM

## 2020-06-10 DIAGNOSIS — M170 Bilateral primary osteoarthritis of knee: Secondary | ICD-10-CM

## 2020-06-10 DIAGNOSIS — C9210 Chronic myeloid leukemia, BCR/ABL-positive, not having achieved remission: Secondary | ICD-10-CM | POA: Diagnosis not present

## 2020-06-10 DIAGNOSIS — E039 Hypothyroidism, unspecified: Secondary | ICD-10-CM

## 2020-06-10 NOTE — Progress Notes (Signed)
New Patient Office Visit  Assessment & Plan:  1. Primary osteoarthritis of both knees - Managed by ortho.   2. CML (chronic myeloid leukemia) ([place]) - Managed by oncology.  3. Hypothyroidism, unspecified type - Managed by endocrinology.  4. Bladder prolapse, female, acquired - Patient will let me know when she is ready for a referral back to urology if this is needed before our next appointment.    Follow-up: Return in about 3 months (around 09/10/2020) for follow-up of chronic medication conditions.   Hendricks Limes, MSN, APRN, FNP-C [facility]  Subjective:  Patient ID: Andrea Martinez, female    DOB: 08/27/1946  Age: 74 y.o. MRN: [id number]  Patient Care Team: Loman Brooklyn, FNP as PCP - General (Family Medicine) Herminio Commons, MD as PCP - Cardiology (Cardiology) Richmond Campbell, MD as Referring Physician (Gastroenterology) Derek Jack, MD as Medical Oncologist (Medical Oncology) Cassandria Anger, MD as Consulting Physician (Endocrinology)  CC:  Chief Complaint  Patient presents with  . New Patient (Initial Visit)    Dr. Holly Bodily  . Establish Care    Check up of chronic medical conditions    HPI Andrea Martinez presents to establish care.   Patient is very upset today.  Patient reports she has been struggling with pain.  She had 2 knee replacements in the past 4 months.  She reports she did well with her first knee replacement but is not doing well with the second.  Her left SI joint is giving her a fit.  She had a MRI on the 18th of this month and is following up with her surgeon tomorrow for the results.  She reports she is not sleeping well because of the pain.  She does report she was unable to do as much physical therapy as she should due to diarrhea that she feels was caused by the pain medication.  Patient reports chronic sweating related to her CML.  She is followed by oncology for this.  Patient reports thyroid  dysfunction.  She is on medication and doing well now.  This is managed by Dr. Dorris Fetch, endocrinologist.  She also reports that since her hysterectomy her bladder has dropped.  She is wearing depends on a regular basis.  She has seen urology who advised that her options were to live with it, have surgery, or have a pessary.  She did go and was fitted for a pessary.  She reports she was fitted multiple times and each time they kept falling out she was therefore encouraged to follow back up with urology.  She does wish to do this surgery with urology but states that she is unable to do this until her pain is controlled and she has completely recovered from her knee surgeries.   Review of Systems  Constitutional: Positive for diaphoresis. Negative for chills, fever, malaise/fatigue and weight loss.  HENT: Negative for congestion, ear discharge, ear pain, nosebleeds, sinus pain, sore throat and tinnitus.   Eyes: Negative for blurred vision, double vision, pain, discharge and redness.  Respiratory: Negative for cough, shortness of breath and wheezing.   Cardiovascular: Negative for chest pain, palpitations and leg swelling.  Gastrointestinal: Negative for abdominal pain, constipation, diarrhea, heartburn, nausea and vomiting.  Genitourinary: Negative for dysuria, frequency and urgency.  Musculoskeletal: Positive for back pain and joint pain. Negative for myalgias.  Skin: Negative for rash.  Neurological: Negative for dizziness, seizures, weakness and headaches.  Psychiatric/Behavioral: Negative for depression, substance abuse and suicidal  ideas. The patient is not nervous/anxious.     Current Outpatient Medications:  .  acetaminophen (TYLENOL) 500 MG tablet, Take 1,000 mg by mouth every 6 (six) hours as needed for mild pain or moderate pain. , Disp: , Rfl:  .  cetirizine (ZYRTEC) 10 MG tablet, Take 10 mg by mouth at bedtime. , Disp: , Rfl:  .  escitalopram (LEXAPRO) 20 MG tablet, Take 1.5 tablets (30  mg total) by mouth daily., Disp: 60 tablet, Rfl: 5 .  Lactobacillus-Inulin (PROBIOTIC DIGESTIVE SUPPORT PO), Take 2 tablets by mouth at bedtime. Gummie, Disp: , Rfl:  .  levothyroxine (SYNTHROID) 125 MCG tablet, Take 1 tablet (125 mcg total) by mouth daily before breakfast., Disp: 90 tablet, Rfl: 3 .  multivitamin-iron-minerals-folic acid (CENTRUM) chewable tablet, Chew 1 tablet by mouth daily., Disp: , Rfl:  .  nilotinib (TASIGNA) 150 MG capsule, Take 2 capsules (300 mg total) by mouth every 12 (twelve) hours., Disp: 120 capsule, Rfl: 6 .  traMADol (ULTRAM) 50 MG tablet, Take 50 mg by mouth 4 (four) times daily as needed., Disp: , Rfl:  .  vitamin B-12 (CYANOCOBALAMIN) 1000 MCG tablet, Take 1,000 mcg by mouth daily., Disp: , Rfl:  .  vitamin E 180 MG (400 UNITS) capsule, Take 400 Units by mouth daily., Disp: , Rfl:   Allergies  Allergen Reactions  . Codeine Nausea Only and Other (See Comments)    Headache  . Sulfonamide Derivatives Nausea Only and Other (See Comments)    Headache  . Esomeprazole Magnesium Nausea Only  . Fluconazole Rash    Redness and blistering on left thigh    Past Medical History:  Diagnosis Date  . Anxiety   . Arthritis   . 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 Gastric ulcer 2011   EGD, 5/11  . Hypothyroidism    not on meds, followed by Dr. Elyse Hsu  . Leukocytosis 11/20/2015  . Lipoma    left upper arm  . Pericardial effusion    a. HCAP complicated by pericardial effusion requiring pericardial window 10/2016 and large L pleural effusion requring VATS.  Marland Kitchen Pleural effusion    a. s/p VATS 2017.  Marland Kitchen Pneumonia 10/2016  . PONV (postoperative nausea and vomiting)   . Prolonged QT interval   . Urine incontinence   . UTI (lower urinary tract infection)    frequent    Past Surgical History:  Procedure Laterality Date  . BRAVO [place] STUDY  12/04/2012   Procedure: BRAVO [place];  Surgeon: [name], MD;  [facility];  Service: Endoscopy;  Laterality: N/A;  . CHOLECYSTECTOMY N/A 01/03/2014   Procedure: LAPAROSCOPIC CHOLECYSTECTOMY WITH INTRAOPERATIVE CHOLANGIOGRAM;  Surgeon: Joyice Faster. Cornett, MD;  Location: [place];  Service: General;  Laterality: N/A;  . COLONOSCOPY  01/2004   DR [facility], few small tics  . ESOPHAGOGASTRODUODENOSCOPY  02/2010   gastric ulcers  . ESOPHAGOGASTRODUODENOSCOPY  12/04/2012   GYB:[id number] gastritis (inflammation) was found in the gastric antrum; multiple biopsies The duodenal mucosa showed no abnormalities in the bulb and second portion of the duodenum  . JOINT REPLACEMENT Right 10/22/2019  . KYPHOPLASTY N/A 02/14/2019   Procedure: KYPHOPLASTY T12 and L1;  Surgeon: Melina Schools, MD;  Location: [place];  Service: Orthopedics;  Laterality: N/A;  120 mins  . LIPOMA EXCISION  08/02/2011   left shoulder  . NOSE SURGERY    . PARTIAL HYSTERECTOMY     vaginal at age 63 years of age  .  TOE DEBRIDEMENT Left 1962   lt great toe  . TOTAL KNEE ARTHROPLASTY Right 10/22/2019   Procedure: TOTAL KNEE ARTHROPLASTY;  Surgeon: Gaynelle Arabian, MD;  [facility];  Service: Orthopedics;  Laterality: Right;  7min  . TOTAL KNEE ARTHROPLASTY Left 03/03/2020   Procedure: TOTAL KNEE ARTHROPLASTY;  Surgeon: Gaynelle Arabian, MD;  [facility];  Service: Orthopedics;  Laterality: Left;  74min  . VIDEO ASSISTED THORACOSCOPY (VATS)/EMPYEMA Left 10/25/2016   Procedure: VIDEO ASSISTED THORACOSCOPY (VATS), BRONCH,DRAINAGE OF PLEURAL EFFUSION,PERICARDIAL WINDOW WITH DRAINAGE OF PERICARDIAL FLUID, TEE;  Surgeon: [name], MD;  Location: [place];  Service: Thoracic;  Laterality: Left;  Marland Kitchen VIDEO BRONCHOSCOPY N/A 10/25/2016   Procedure: VIDEO BRONCHOSCOPY;  Surgeon: [name], MD;  [facility];  Service: Thoracic;  Laterality: N/A;    Family History  Problem Relation Age of Onset  . Lung cancer Father   . COPD Mother   . Stroke Mother   . Thyroid disease Mother     . Colon cancer Neg Hx   . Anesthesia problems Neg Hx   . Hypotension Neg Hx   . Malignant hyperthermia Neg Hx   . Pseudochol deficiency Neg Hx     Social History   Socioeconomic History  . Marital status: Married    Spouse name: Not on file  . Number of children: 2  . Years of education: 12th grade  . Highest education level: Not on file  Occupational History  . Occupation: retired    Fish farm manager: UENPLOYED    Employer: RETIRED  Tobacco Use  . Smoking status: Never Smoker  . Smokeless tobacco: Never Used  Vaping Use  . Vaping Use: Never used  Substance and Sexual Activity  . Alcohol use: No    Alcohol/week: 0.0 standard drinks  . Drug use: No  . Sexual activity: Yes    Partners: Male    Birth control/protection: None  Other Topics Concern  . Not on file  Social History Narrative  . Not on file   Social Determinants of Health   Financial Resource Strain:   . Difficulty of Paying Living Expenses:   Food Insecurity:   . Worried About Charity fundraiser in the Last Year:   . Arboriculturist in the Last Year:   Transportation Needs:   . Film/video editor (Medical):   Marland Kitchen Lack of Transportation (Non-Medical):   Physical Activity:   . Days of Exercise per Week:   . Minutes of Exercise per Session:   Stress:   . Feeling of Stress :   Social Connections:   . Frequency of Communication with Friends and Family:   . Frequency of Social Gatherings with Friends and Family:   . Attends Religious Services:   . Active Member of Clubs or Organizations:   . Attends Archivist Meetings:   Marland Kitchen Marital Status:   Intimate Partner Violence:   . Fear of Current or Ex-Partner:   . Emotionally Abused:   Marland Kitchen Physically Abused:   . Sexually Abused:     Objective:   Today's Vitals: BP 126/79   Pulse 95   Temp (!) 97 F (36.1 C) (Temporal)   Ht 5\' 5"  (1.651 m)   Wt 218 lb (98.9 kg)   SpO2 97%   BMI 36.28 kg/m   Physical Exam Vitals reviewed.  Constitutional:       General: She is not in acute distress.    Appearance: Normal appearance. She is obese.  She is not ill-appearing, toxic-appearing or diaphoretic.  HENT:     Head: Normocephalic and atraumatic.  Eyes:     General: No scleral icterus.       Right eye: No discharge.        Left eye: No discharge.     Conjunctiva/sclera: Conjunctivae normal.  Cardiovascular:     Rate and Rhythm: Normal rate and regular rhythm.     Heart sounds: Normal heart sounds. No murmur heard.  No friction rub. No gallop.   Pulmonary:     Effort: Pulmonary effort is normal. No respiratory distress.     Breath sounds: Normal breath sounds. No stridor. No wheezing, rhonchi or rales.  Musculoskeletal:        General: Normal range of motion.     Cervical back: Normal range of motion.  Skin:    General: Skin is warm and dry.     Capillary Refill: Capillary refill takes less than 2 seconds.  Neurological:     General: No focal deficit present.     Mental Status: She is alert and oriented to person, place, and time. Mental status is at baseline.     Gait: Gait abnormal (ambulating with walker).  Psychiatric:        Mood and Affect: Mood normal.        Behavior: Behavior normal.        Thought Content: Thought content normal.        Judgment: Judgment normal.

## 2020-06-11 DIAGNOSIS — M545 Low back pain: Secondary | ICD-10-CM | POA: Diagnosis not present

## 2020-06-12 ENCOUNTER — Other Ambulatory Visit (HOSPITAL_COMMUNITY): Payer: Medicare Other

## 2020-06-19 ENCOUNTER — Encounter (HOSPITAL_COMMUNITY): Payer: Self-pay | Admitting: *Deleted

## 2020-06-24 DIAGNOSIS — M5136 Other intervertebral disc degeneration, lumbar region: Secondary | ICD-10-CM | POA: Diagnosis not present

## 2020-06-26 ENCOUNTER — Other Ambulatory Visit (HOSPITAL_COMMUNITY): Payer: Medicare Other

## 2020-07-08 DIAGNOSIS — M545 Low back pain: Secondary | ICD-10-CM | POA: Diagnosis not present

## 2020-07-08 DIAGNOSIS — M5416 Radiculopathy, lumbar region: Secondary | ICD-10-CM | POA: Diagnosis not present

## 2020-07-31 DIAGNOSIS — M5136 Other intervertebral disc degeneration, lumbar region: Secondary | ICD-10-CM | POA: Diagnosis not present

## 2020-08-19 DIAGNOSIS — G894 Chronic pain syndrome: Secondary | ICD-10-CM | POA: Diagnosis not present

## 2020-08-19 DIAGNOSIS — M25562 Pain in left knee: Secondary | ICD-10-CM | POA: Diagnosis not present

## 2020-08-19 DIAGNOSIS — M533 Sacrococcygeal disorders, not elsewhere classified: Secondary | ICD-10-CM | POA: Diagnosis not present

## 2020-08-21 ENCOUNTER — Inpatient Hospital Stay (HOSPITAL_COMMUNITY): Payer: Medicare Other | Attending: Hematology

## 2020-08-21 ENCOUNTER — Other Ambulatory Visit: Payer: Self-pay

## 2020-08-21 DIAGNOSIS — R450 Nervousness: Secondary | ICD-10-CM | POA: Diagnosis not present

## 2020-08-21 DIAGNOSIS — E039 Hypothyroidism, unspecified: Secondary | ICD-10-CM | POA: Insufficient documentation

## 2020-08-21 DIAGNOSIS — M79605 Pain in left leg: Secondary | ICD-10-CM | POA: Insufficient documentation

## 2020-08-21 DIAGNOSIS — R600 Localized edema: Secondary | ICD-10-CM | POA: Diagnosis not present

## 2020-08-21 DIAGNOSIS — Z87440 Personal history of urinary (tract) infections: Secondary | ICD-10-CM | POA: Insufficient documentation

## 2020-08-21 DIAGNOSIS — R5383 Other fatigue: Secondary | ICD-10-CM | POA: Diagnosis not present

## 2020-08-21 DIAGNOSIS — M1990 Unspecified osteoarthritis, unspecified site: Secondary | ICD-10-CM | POA: Insufficient documentation

## 2020-08-21 DIAGNOSIS — M79604 Pain in right leg: Secondary | ICD-10-CM | POA: Diagnosis not present

## 2020-08-21 DIAGNOSIS — C9210 Chronic myeloid leukemia, BCR/ABL-positive, not having achieved remission: Secondary | ICD-10-CM | POA: Diagnosis not present

## 2020-08-21 DIAGNOSIS — E559 Vitamin D deficiency, unspecified: Secondary | ICD-10-CM | POA: Insufficient documentation

## 2020-08-21 DIAGNOSIS — M545 Low back pain: Secondary | ICD-10-CM | POA: Insufficient documentation

## 2020-08-21 DIAGNOSIS — Z79899 Other long term (current) drug therapy: Secondary | ICD-10-CM | POA: Diagnosis not present

## 2020-08-21 LAB — COMPREHENSIVE METABOLIC PANEL
ALT: 18 U/L (ref 0–44)
AST: 26 U/L (ref 15–41)
Albumin: 3.8 g/dL (ref 3.5–5.0)
Alkaline Phosphatase: 173 U/L — ABNORMAL HIGH (ref 38–126)
Anion gap: 13 (ref 5–15)
BUN: 21 mg/dL (ref 8–23)
CO2: 24 mmol/L (ref 22–32)
Calcium: 9.2 mg/dL (ref 8.9–10.3)
Chloride: 99 mmol/L (ref 98–111)
Creatinine, Ser: 1 mg/dL (ref 0.44–1.00)
GFR calc Af Amer: >60 mL/min (ref >60)
GFR calc non Af Amer: 55 mL/min — ABNORMAL LOW (ref >60)
Glucose, Bld: 117 mg/dL — ABNORMAL HIGH (ref 70–99)
Potassium: 4.3 mmol/L (ref 3.5–5.1)
Sodium: 136 mmol/L (ref 135–145)
Total Bilirubin: 0.8 mg/dL (ref 0.3–1.2)
Total Protein: 7.5 g/dL (ref 6.5–8.1)

## 2020-08-21 LAB — CBC WITH DIFFERENTIAL/PLATELET
Abs Immature Granulocytes: 0.08 10*3/uL — ABNORMAL HIGH (ref 0.00–0.07)
Basophils Absolute: 0 10*3/uL (ref 0.0–0.1)
Basophils Relative: 0 %
Eosinophils Absolute: 0.2 10*3/uL (ref 0.0–0.5)
Eosinophils Relative: 2 %
HCT: 37.9 % (ref 36.0–46.0)
Hemoglobin: 11.6 g/dL — ABNORMAL LOW (ref 12.0–15.0)
Immature Granulocytes: 1 %
Lymphocytes Relative: 9 %
Lymphs Abs: 1.2 10*3/uL (ref 0.7–4.0)
MCH: 27 pg (ref 26.0–34.0)
MCHC: 30.6 g/dL (ref 30.0–36.0)
MCV: 88.3 fL (ref 80.0–100.0)
Monocytes Absolute: 1.1 10*3/uL — ABNORMAL HIGH (ref 0.1–1.0)
Monocytes Relative: 7 %
Neutro Abs: 11.5 10*3/uL — ABNORMAL HIGH (ref 1.7–7.7)
Neutrophils Relative %: 81 %
Platelets: 365 10*3/uL (ref 150–400)
RBC: 4.29 MIL/uL (ref 3.87–5.11)
RDW: 14.5 % (ref 11.5–15.5)
WBC: 14.1 10*3/uL — ABNORMAL HIGH (ref 4.0–10.5)
nRBC: 0 % (ref 0.0–0.2)

## 2020-08-25 ENCOUNTER — Encounter: Payer: Self-pay | Admitting: Family Medicine

## 2020-08-26 LAB — BCR-ABL1, CML/ALL, PCR, QUANT: Interpretation (BCRAL):: NEGATIVE

## 2020-08-28 ENCOUNTER — Inpatient Hospital Stay (HOSPITAL_BASED_OUTPATIENT_CLINIC_OR_DEPARTMENT_OTHER): Payer: Medicare Other | Admitting: Hematology

## 2020-08-28 ENCOUNTER — Other Ambulatory Visit: Payer: Self-pay

## 2020-08-28 VITALS — BP 130/70 | HR 110 | Temp 98.6°F | Resp 18 | Wt 208.6 lb

## 2020-08-28 DIAGNOSIS — M545 Low back pain: Secondary | ICD-10-CM | POA: Diagnosis not present

## 2020-08-28 DIAGNOSIS — R5383 Other fatigue: Secondary | ICD-10-CM | POA: Diagnosis not present

## 2020-08-28 DIAGNOSIS — Z87440 Personal history of urinary (tract) infections: Secondary | ICD-10-CM | POA: Diagnosis not present

## 2020-08-28 DIAGNOSIS — Z79899 Other long term (current) drug therapy: Secondary | ICD-10-CM | POA: Diagnosis not present

## 2020-08-28 DIAGNOSIS — E039 Hypothyroidism, unspecified: Secondary | ICD-10-CM | POA: Diagnosis not present

## 2020-08-28 DIAGNOSIS — R600 Localized edema: Secondary | ICD-10-CM | POA: Diagnosis not present

## 2020-08-28 DIAGNOSIS — R450 Nervousness: Secondary | ICD-10-CM | POA: Diagnosis not present

## 2020-08-28 DIAGNOSIS — M1990 Unspecified osteoarthritis, unspecified site: Secondary | ICD-10-CM | POA: Diagnosis not present

## 2020-08-28 DIAGNOSIS — M79605 Pain in left leg: Secondary | ICD-10-CM | POA: Diagnosis not present

## 2020-08-28 DIAGNOSIS — C9210 Chronic myeloid leukemia, BCR/ABL-positive, not having achieved remission: Secondary | ICD-10-CM

## 2020-08-28 DIAGNOSIS — E559 Vitamin D deficiency, unspecified: Secondary | ICD-10-CM | POA: Diagnosis not present

## 2020-08-28 DIAGNOSIS — M79604 Pain in right leg: Secondary | ICD-10-CM | POA: Diagnosis not present

## 2020-08-28 NOTE — Patient Instructions (Signed)
[facility] °Discharge Instructions ° °You were seen today by Dr. Katragadda. He went over your recent results. Dr. Katragadda will see you back in 4 months for labs and follow up. ° ° °Thank you for choosing [facility] at [facility] to provide your oncology and hematology care.  To afford each patient quality time with our provider, please arrive at least 15 minutes before your scheduled appointment time.  ° °If you have a lab appointment with the Cancer Center please come in thru the Main Entrance and check in at the main information desk ° °You need to re-schedule your appointment should you arrive 10 or more minutes late.  We strive to give you quality time with our providers, and arriving late affects you and other patients whose appointments are after yours.  Also, if you no show three or more times for appointments you may be dismissed from the clinic at the providers discretion.     °Again, thank you for choosing [facility].  Our hope is that these requests will decrease the amount of time that you wait before being seen by our physicians.       °_____________________________________________________________ ° °Should you have questions after your visit to [facility], please contact our office at [phone] between the hours of 8:00 a.m. and 4:30 p.m.  Voicemails left after 4:00 p.m. will not be returned until the following business day.  For prescription refill requests, have your pharmacy contact our office and allow 72 hours.   ° °Cancer Center Support Programs:  ° °> Cancer Support Group  °2nd Tuesday of the month 1pm-2pm, Journey Room  ° ° °

## 2020-08-28 NOTE — Progress Notes (Signed)
[place], [place] [address]   CLINIC:  Medical Oncology/Hematology  PCP:  Loman Brooklyn, [address] / [place] [address]  [phone]  REASON FOR VISIT:  Follow-up for [facility]  PRIOR THERAPY: None  CURRENT THERAPY: Tasigna  INTERVAL HISTORY:  [name], a 74 y.o. female, returns for routine follow-up for her CML. Deliah was last seen on 05/29/2020.  Today she is accompanied by her husband. She reports not feeling well. She complains of having back pain and continuous back procedures. She is taking Tasigna BID and tolerating it well.   REVIEW OF SYSTEMS:  Review of Systems  Constitutional: Positive for appetite change (moderately decreased) and fatigue (severe).  Cardiovascular: Positive for leg swelling.  Musculoskeletal: Positive for back pain (10/10 back and legs pain).  Psychiatric/Behavioral: The patient is nervous/anxious.   All other systems reviewed and are negative.   PAST MEDICAL/SURGICAL HISTORY:  Past Medical History:  Diagnosis Date  . Anxiety   . Arthritis   . 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 Gastric ulcer 2011   EGD, 5/11  . Hypothyroidism    not on meds, followed by Dr. Elyse Hsu  . Lipoma    left upper arm  . Osteoporosis   . Pericardial effusion    a. HCAP complicated by pericardial effusion requiring pericardial window 10/2016 and large L pleural effusion requring VATS.  Marland Kitchen Pleural effusion    a. s/p VATS 2017.  Marland Kitchen Pneumonia 10/2016  . PONV (postoperative nausea and vomiting)   . Prolonged QT interval   . Urine incontinence   . UTI (lower urinary tract infection)    frequent  . Vitamin D deficiency    Past Surgical History:  Procedure Laterality Date  . BRAVO [place] STUDY  12/04/2012   Procedure: BRAVO [place];  Surgeon: [name], MD;  [facility];  Service: Endoscopy;  Laterality: N/A;  . CHOLECYSTECTOMY N/A 01/03/2014    Procedure: LAPAROSCOPIC CHOLECYSTECTOMY WITH INTRAOPERATIVE CHOLANGIOGRAM;  Surgeon: Joyice Faster. Cornett, MD;  Location: [place];  Service: General;  Laterality: N/A;  . COLONOSCOPY  01/2004   DR [facility], few small tics  . ESOPHAGOGASTRODUODENOSCOPY  02/2010   gastric ulcers  . ESOPHAGOGASTRODUODENOSCOPY  12/04/2012   [id number] gastritis (inflammation) was found in the gastric antrum; multiple biopsies The duodenal mucosa showed no abnormalities in the bulb and second portion of the duodenum  . JOINT REPLACEMENT Right 10/22/2019  . KYPHOPLASTY N/A 02/14/2019   Procedure: KYPHOPLASTY T12 and L1;  Surgeon: Melina Schools, MD;  Location: [place];  Service: Orthopedics;  Laterality: N/A;  120 mins  . LIPOMA EXCISION  08/02/2011   left shoulder  . NOSE SURGERY    . PARTIAL HYSTERECTOMY     vaginal at age 48 years of age  . TOE DEBRIDEMENT Left 1962   lt great toe  . TOTAL KNEE ARTHROPLASTY Right 10/22/2019   Procedure: TOTAL KNEE ARTHROPLASTY;  Surgeon: Gaynelle Arabian, MD;  [facility];  Service: Orthopedics;  Laterality: Right;  65min  . TOTAL KNEE ARTHROPLASTY Left 03/03/2020   Procedure: TOTAL KNEE ARTHROPLASTY;  Surgeon: Gaynelle Arabian, MD;  [facility];  Service: Orthopedics;  Laterality: Left;  41min  . VIDEO ASSISTED THORACOSCOPY (VATS)/EMPYEMA Left 10/25/2016   Procedure: VIDEO ASSISTED THORACOSCOPY (VATS), BRONCH,DRAINAGE OF PLEURAL EFFUSION,PERICARDIAL WINDOW WITH DRAINAGE OF PERICARDIAL FLUID, TEE;  Surgeon: [name], MD;  Location: [place];  Service:  Thoracic;  Laterality: Left;  Marland Kitchen VIDEO BRONCHOSCOPY N/A 10/25/2016   Procedure: VIDEO BRONCHOSCOPY;  Surgeon: [name], MD;  Location: [place];  Service: Thoracic;  Laterality: N/A;    SOCIAL HISTORY:  Social History   Socioeconomic History  . Marital status: Married    Spouse name: Not on file  . Number of children: 2  . Years of education: 12th grade  . Highest education level: Not on file    Occupational History  . Occupation: retired    Fish farm manager: UENPLOYED    Employer: RETIRED  Tobacco Use  . Smoking status: Never Smoker  . Smokeless tobacco: Never Used  Vaping Use  . Vaping Use: Never used  Substance and Sexual Activity  . Alcohol use: No    Alcohol/week: 0.0 standard drinks  . Drug use: No  . Sexual activity: Yes    Partners: Male    Birth control/protection: None  Other Topics Concern  . Not on file  Social History Narrative  . Not on file   Social Determinants of Health   Financial Resource Strain:   . Difficulty of Paying Living Expenses: Not on file  Food Insecurity:   . Worried About Charity fundraiser in the Last Year: Not on file  . Ran Out of Food in the Last Year: Not on file  Transportation Needs:   . Lack of Transportation (Medical): Not on file  . Lack of Transportation (Non-Medical): Not on file  Physical Activity:   . Days of Exercise per Week: Not on file  . Minutes of Exercise per Session: Not on file  Stress:   . Feeling of Stress : Not on file  Social Connections:   . Frequency of Communication with Friends and Family: Not on file  . Frequency of Social Gatherings with Friends and Family: Not on file  . Attends Religious Services: Not on file  . Active Member of Clubs or Organizations: Not on file  . Attends Archivist Meetings: Not on file  . Marital Status: Not on file  Intimate Partner Violence:   . Fear of Current or Ex-Partner: Not on file  . Emotionally Abused: Not on file  . Physically Abused: Not on file  . Sexually Abused: Not on file    FAMILY HISTORY:  Family History  Problem Relation Age of Onset  . Lung cancer Father   . COPD Mother   . Stroke Mother   . Thyroid disease Mother   . Colon cancer Neg Hx   . Anesthesia problems Neg Hx   . Hypotension Neg Hx   . Malignant hyperthermia Neg Hx   . Pseudochol deficiency Neg Hx     CURRENT MEDICATIONS:  Current Outpatient Medications  Medication Sig  Dispense Refill  . acetaminophen (TYLENOL) 500 MG tablet Take 1,000 mg by mouth every 6 (six) hours as needed for mild pain or moderate pain.     . cetirizine (ZYRTEC) 10 MG tablet Take 10 mg by mouth at bedtime.     . Cholecalciferol (VITAMIN D3 PO) Take by mouth.    . escitalopram (LEXAPRO) 20 MG tablet Take 1.5 tablets (30 mg total) by mouth daily. 60 tablet 5  . Lactobacillus-Inulin (PROBIOTIC DIGESTIVE SUPPORT PO) Take 2 tablets by mouth at bedtime. Gummie    . levothyroxine (SYNTHROID) 125 MCG tablet Take 1 tablet (125 mcg total) by mouth daily before breakfast. 90 tablet 3  . Multiple Vitamin (MULTIVITAMIN) tablet Take 1 tablet by mouth daily.    Marland Kitchen  multivitamin-iron-minerals-folic acid (CENTRUM) chewable tablet Chew 1 tablet by mouth daily.    . nilotinib (TASIGNA) 150 MG capsule Take 2 capsules (300 mg total) by mouth every 12 (twelve) hours. 120 capsule 6  . traMADol (ULTRAM) 50 MG tablet Take 50 mg by mouth 4 (four) times daily as needed.    . vitamin B-12 (CYANOCOBALAMIN) 1000 MCG tablet Take 1,000 mcg by mouth daily.    . vitamin E 180 MG (400 UNITS) capsule Take 400 Units by mouth daily.     No current facility-administered medications for this visit.    ALLERGIES:  Allergies  Allergen Reactions  . Codeine Nausea Only and Other (See Comments)    Headache  . Sulfonamide Derivatives Nausea Only and Other (See Comments)    Headache  . Esomeprazole Magnesium Nausea Only  . Fluconazole Rash    Redness and blistering on left thigh    PHYSICAL EXAM:  Performance status (ECOG): 2 - Symptomatic, <50% confined to bed  Vitals:   08/28/20 1420  BP: 130/70  Pulse: (!) 110  Resp: 18  Temp: 98.6 F (37 C)  SpO2: 99%   Wt Readings from Last 3 Encounters:  08/28/20 208 lb 9.6 oz (94.6 kg)  06/10/20 218 lb (98.9 kg)  06/02/20 217 lb 12.8 oz (98.8 kg)   Physical Exam Vitals reviewed.  Constitutional:      Appearance: Normal appearance. She is obese.  Cardiovascular:      Rate and Rhythm: Normal rate and regular rhythm.     Pulses: Normal pulses.     Heart sounds: Normal heart sounds.  Pulmonary:     Effort: Pulmonary effort is normal.     Breath sounds: Normal breath sounds.  Musculoskeletal:     Right lower leg: Edema (1+) present.     Left lower leg: Edema (1+) present.  Neurological:     General: No focal deficit present.     Mental Status: She is alert and oriented to person, place, and time.  Psychiatric:        Mood and Affect: Mood is anxious.        Behavior: Behavior normal.     LABORATORY DATA:  I have reviewed the labs as listed.  CBC Latest Ref Rng & Units 08/21/2020 05/22/2020 03/04/2020  WBC 4.0 - 10.5 K/uL 14.1(H) 12.1(H) 16.6(H)  Hemoglobin 12.0 - 15.0 g/dL 11.6(L) 11.1(L) 9.8(L)  Hematocrit 36 - 46 % 37.9 37.3 32.6(L)  Platelets 150 - 400 K/uL 365 345 232   CMP Latest Ref Rng & Units 08/21/2020 05/22/2020 03/04/2020  Glucose 70 - 99 mg/dL 117(H) 111(H) 167(H)  BUN 8 - 23 mg/dL 21 25(H) 24(H)  Creatinine 0.44 - 1.00 mg/dL 1.00 0.89 0.92  Sodium 135 - 145 mmol/L 136 135 136  Potassium 3.5 - 5.1 mmol/L 4.3 4.5 5.3(H)  Chloride 98 - 111 mmol/L 99 101 104  CO2 22 - 32 mmol/L 24 24 23   Calcium 8.9 - 10.3 mg/dL 9.2 8.9 8.6(L)  Total Protein 6.5 - 8.1 g/dL 7.5 8.3(H) -  Total Bilirubin 0.3 - 1.2 mg/dL 0.8 0.6 -  Alkaline Phos 38 - 126 U/L 173(H) 153(H) -  AST 15 - 41 U/L 26 29 -  ALT 0 - 44 U/L 18 19 -      Component Value Date/Time   RBC 4.29 08/21/2020 1340   MCV 88.3 08/21/2020 1340   MCH 27.0 08/21/2020 1340   MCHC 30.6 08/21/2020 1340   RDW 14.5 08/21/2020 1340   LYMPHSABS 1.2  08/21/2020 1340   MONOABS 1.1 (H) 08/21/2020 1340   EOSABS 0.2 08/21/2020 1340   BASOSABS 0.0 08/21/2020 1340    DIAGNOSTIC IMAGING:  I have independently reviewed the scans and discussed with the patient. No results found.   ASSESSMENT:  1. 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 -Left pleural effusion and pericardial  effusion requiring pericardial window on 10/25/2016.  Fluid cytology negative. -Started back on Nilotinib 300 mg every 12 hours on 12/06/2016.  2.  Daytime sweats: -He had a long history of daytime sweats.  She had extensive work-up including a CT scan CAP, which showed no evidence of malignancy.  Hepatic morphology suspicious for cirrhosis.   PLAN:  1. CML in chronic phase: -She is tolerating Nilotinib 300 mg twice daily very well. -Reviewed labs from 08/21/2020.  White count is mildly elevated at 4.1.  Hemoglobin and platelets are normal. -BCR/ABL by quantitative PCR on 08/21/2020 was undetectable. -Recommend follow-up in 4 months with repeat BCR/ABL and CBC. -After next visit, if PCR is undetectable we will switch to 59-month follow-ups.  2.  Low back pain: -She had developed back pain since she had both knees replaced. -She is having MRI of the back again.  She had some injections done.  Orders placed this encounter:  No orders of the defined types were placed in this encounter.    Derek Jack, MD [place] [phone]   I, Milinda Antis, am acting as a scribe for Dr. Sanda Linger.  I, Derek Jack MD, have reviewed the above documentation for accuracy and completeness, and I agree with the above.

## 2020-09-03 DIAGNOSIS — M533 Sacrococcygeal disorders, not elsewhere classified: Secondary | ICD-10-CM | POA: Diagnosis not present

## 2020-09-04 DIAGNOSIS — Z96652 Presence of left artificial knee joint: Secondary | ICD-10-CM | POA: Diagnosis not present

## 2020-09-04 DIAGNOSIS — Z471 Aftercare following joint replacement surgery: Secondary | ICD-10-CM | POA: Diagnosis not present

## 2020-09-11 ENCOUNTER — Ambulatory Visit (INDEPENDENT_AMBULATORY_CARE_PROVIDER_SITE_OTHER): Payer: Medicare Other | Admitting: Family Medicine

## 2020-09-11 ENCOUNTER — Encounter: Payer: Self-pay | Admitting: Family Medicine

## 2020-09-11 ENCOUNTER — Other Ambulatory Visit: Payer: Self-pay

## 2020-09-11 VITALS — BP 125/65 | HR 101 | Temp 95.4°F | Ht 60.0 in | Wt 211.8 lb

## 2020-09-11 DIAGNOSIS — N8110 Cystocele, unspecified: Secondary | ICD-10-CM

## 2020-09-11 DIAGNOSIS — M545 Low back pain, unspecified: Secondary | ICD-10-CM

## 2020-09-11 DIAGNOSIS — Z23 Encounter for immunization: Secondary | ICD-10-CM | POA: Diagnosis not present

## 2020-09-11 DIAGNOSIS — E039 Hypothyroidism, unspecified: Secondary | ICD-10-CM | POA: Diagnosis not present

## 2020-09-11 DIAGNOSIS — F419 Anxiety disorder, unspecified: Secondary | ICD-10-CM

## 2020-09-11 DIAGNOSIS — G8929 Other chronic pain: Secondary | ICD-10-CM

## 2020-09-11 DIAGNOSIS — I700 Atherosclerosis of aorta: Secondary | ICD-10-CM | POA: Diagnosis not present

## 2020-09-11 MED ORDER — ESCITALOPRAM OXALATE 20 MG PO TABS: 30.0000 mg | ORAL_TABLET | Freq: Every day | ORAL | 1 refills | Status: DC

## 2020-09-11 NOTE — Progress Notes (Signed)
Assessment & Plan:  1. Anxiety - Well controlled on current regimen.   2. Chronic low back pain, unspecified back pain laterality, unspecified whether sciatica present - Patient to keep follow-up for next month.  3. Atherosclerosis of aorta (HCC) - Lipid panel; Future (printed order given to patient)  4. Hypothyroidism, unspecified type - Well controlled on current regimen.   5. Bladder prolapse, female, acquired - Not ready to do anything about this.  6. Morbid obesity ([place]) - Diet and exercise encouraged as tolerated. - Lipid panel; Future (printed order given to patient)  7. Need for immunization against influenza - Flu Vaccine QUAD High Dose(Fluad)   No follow-ups on file.  Hendricks Limes, MSN, APRN, FNP-C [facility]  Subjective:    Patient ID: Andrea Martinez, female    DOB: 02-16-1946, 74 y.o.   MRN: [id number]  Patient Care Team: Loman Brooklyn, FNP as PCP - General (Family Medicine) Herminio Commons, MD (Inactive) as PCP - Cardiology (Cardiology) Richmond Campbell, MD as Referring Physician (Gastroenterology) Derek Jack, MD as Medical Oncologist (Medical Oncology) Cassandria Anger, MD as Consulting Physician (Endocrinology)   Chief Complaint:  Chief Complaint  Patient presents with   Anxiety    3 month follow up of chronic medical conditins    HPI: Andrea Martinez is a 73 y.o. female presenting on 09/11/2020 for Anxiety (3 month follow up of chronic medical conditins)  Patient reports Lexapro is working well for her anxiety.  GAD 7 : Generalized Anxiety Score 09/11/2020 06/10/2020  Nervous, Anxious, on Edge 0 0  Control/stop worrying 2 0  Worry too much - different things 2 1  Trouble relaxing 1 1  Restless 1 0  Easily annoyed or irritable 1 0  Afraid - awful might happen 0 0  Total GAD 7 Score 7 2   Depression screen [facility] 2/9 09/11/2020 06/10/2020 02/04/2020  Decreased Interest 1 1 0  Down, Depressed, Hopeless  1 1 0  PHQ - 2 Score 2 2 0  Altered sleeping 1 1 -  Tired, decreased energy 2 1 -  Change in appetite 0 1 -  Feeling bad or failure about yourself  1 0 -  Trouble concentrating 1 0 -  Moving slowly or fidgety/restless 0 0 -  Suicidal thoughts 0 0 -  PHQ-9 Score 7 5 -  Some recent data might be hidden    She reports she has received five nerve blocks in her back and is still having pain.  She is scheduled to follow-up in 2 weeks.  She is still not ready to do anything about her bladder prolapse.  New complaints: None  Social history:  Relevant past medical, surgical, family and social history reviewed and updated as indicated. Interim medical history since our last visit reviewed.  Allergies and medications reviewed and updated.  DATA REVIEWED: CHART IN EPIC  ROS: Negative unless specifically indicated above in HPI.    Current Outpatient Medications:    acetaminophen (TYLENOL) 500 MG tablet, Take 1,000 mg by mouth every 6 (six) hours as needed for mild pain or moderate pain. , Disp: , Rfl:    cetirizine (ZYRTEC) 10 MG tablet, Take 10 mg by mouth at bedtime. , Disp: , Rfl:    Cholecalciferol (VITAMIN D3 PO), Take by mouth., Disp: , Rfl:    escitalopram (LEXAPRO) 20 MG tablet, Take 1.5 tablets (30 mg total) by mouth daily., Disp: 60 tablet, Rfl: 5   Lactobacillus-Inulin (PROBIOTIC DIGESTIVE SUPPORT PO),  Take 2 tablets by mouth at bedtime. Gummie, Disp: , Rfl:    levothyroxine (SYNTHROID) 125 MCG tablet, Take 1 tablet (125 mcg total) by mouth daily before breakfast., Disp: 90 tablet, Rfl: 3   Multiple Vitamin (MULTIVITAMIN) tablet, Take 1 tablet by mouth daily., Disp: , Rfl:    multivitamin-iron-minerals-folic acid (CENTRUM) chewable tablet, Chew 1 tablet by mouth daily., Disp: , Rfl:    nilotinib (TASIGNA) 150 MG capsule, Take 2 capsules (300 mg total) by mouth every 12 (twelve) hours., Disp: 120 capsule, Rfl: 6   traMADol (ULTRAM) 50 MG tablet, Take 50 mg by mouth 4  (four) times daily as needed., Disp: , Rfl:    vitamin B-12 (CYANOCOBALAMIN) 1000 MCG tablet, Take 1,000 mcg by mouth daily., Disp: , Rfl:    vitamin E 180 MG (400 UNITS) capsule, Take 400 Units by mouth daily., Disp: , Rfl:    Allergies  Allergen Reactions   Codeine Nausea Only and Other (See Comments)    Headache   Sulfonamide Derivatives Nausea Only and Other (See Comments)    Headache   Esomeprazole Magnesium Nausea Only   Fluconazole Rash    Redness and blistering on left thigh   Past Medical History:  Diagnosis Date   Anxiety    Arthritis    Chronic myelogenous leukemia (CML), 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 Elyse Hsu   Lipoma    left upper arm   Osteoporosis    Pericardial effusion    a. HCAP complicated by pericardial effusion requiring pericardial window 10/2016 and large L pleural effusion requring VATS.   Pleural effusion    a. s/p VATS 2017.   Pneumonia 10/2016   PONV (postoperative nausea and vomiting)    Prolonged QT interval    Urine incontinence    UTI (lower urinary tract infection)    frequent   Vitamin D deficiency     Past Surgical History:  Procedure Laterality Date   BRAVO [place] STUDY  12/04/2012   Procedure: BRAVO [place];  Surgeon: [name], MD;  [facility];  Service: Endoscopy;  Laterality: N/A;   CHOLECYSTECTOMY N/A 01/03/2014   Procedure: LAPAROSCOPIC CHOLECYSTECTOMY WITH INTRAOPERATIVE CHOLANGIOGRAM;  Surgeon: Joyice Faster. Cornett, MD;  Location: [facility];  Service: General;  Laterality: N/A;   COLONOSCOPY  01/2004   DR [facility], few small tics   ESOPHAGOGASTRODUODENOSCOPY  02/2010   gastric ulcers   ESOPHAGOGASTRODUODENOSCOPY  12/04/2012   WJX:BJYNWGN gastritis (inflammation) was found in the gastric antrum; multiple biopsies The duodenal mucosa showed no abnormalities in the bulb and second portion of the  duodenum   JOINT REPLACEMENT Right 10/22/2019   KYPHOPLASTY N/A 02/14/2019   Procedure: KYPHOPLASTY T12 and L1;  Surgeon: Melina Schools, MD;  Location: [facility];  Service: Orthopedics;  Laterality: N/A;  120 mins   LIPOMA EXCISION  08/02/2011   left shoulder   NOSE SURGERY     PARTIAL HYSTERECTOMY     vaginal at age 37 years of age   TOE 15 Left 1962   lt great toe   TOTAL KNEE ARTHROPLASTY Right 10/22/2019   Procedure: TOTAL KNEE ARTHROPLASTY;  Surgeon: Gaynelle Arabian, MD;  Location: [facility];  Service: Orthopedics;  Laterality: Right;  82min   TOTAL KNEE ARTHROPLASTY Left 03/03/2020   Procedure: TOTAL KNEE ARTHROPLASTY;  Surgeon: Gaynelle Arabian, MD;  Location: [facility];  Service: Orthopedics;  Laterality:  Left;  4min   VIDEO ASSISTED THORACOSCOPY (VATS)/EMPYEMA Left 10/25/2016   Procedure: VIDEO ASSISTED THORACOSCOPY (VATS), BRONCH,DRAINAGE OF PLEURAL EFFUSION,PERICARDIAL WINDOW WITH DRAINAGE OF PERICARDIAL FLUID, TEE;  Surgeon: [name], MD;  Location: [facility];  Service: Thoracic;  Laterality: Left;   VIDEO BRONCHOSCOPY N/A 10/25/2016   Procedure: VIDEO BRONCHOSCOPY;  Surgeon: [name], MD;  Location: [place];  Service: Thoracic;  Laterality: N/A;    Social History   Socioeconomic History   Marital status: Married    Spouse name: Not on file   Number of children: 2   Years of education: 12th grade   Highest education level: Not on file  Occupational History   Occupation: retired    Fish farm manager: [employer]    Employer: RETIRED  Tobacco Use   Smoking status: Never Smoker   Smokeless tobacco: Never Used  Scientific laboratory technician Use: Never used  Substance and Sexual Activity   Alcohol use: No    Alcohol/week: 0.0 standard drinks   Drug use: No   Sexual activity: Yes    Partners: Male    Birth control/protection: None  Other Topics Concern   Not on file  Social History Narrative   Not on file   Social Determinants of Health    Financial Resource Strain:    Difficulty of Paying Living Expenses: Not on file  Food Insecurity:    Worried About Charity fundraiser in the Last Year: Not on file   [employer] of Food in the Last Year: Not on file  Transportation Needs:    Lack of Transportation (Medical): Not on file   Lack of Transportation (Non-Medical): Not on file  Physical Activity:    Days of Exercise per Week: Not on file   Minutes of Exercise per Session: Not on file  Stress:    Feeling of Stress : Not on file  Social Connections:    Frequency of Communication with Friends and Family: Not on file   Frequency of Social Gatherings with Friends and Family: Not on file   Attends Religious Services: Not on file   Active Member of [place] or Organizations: Not on file   Attends Archivist Meetings: Not on file   Marital Status: Not on file  Intimate Partner Violence:    Fear of Current or Ex-Partner: Not on file   Emotionally Abused: Not on file   Physically Abused: Not on file   Sexually Abused: Not on file        Objective:    BP 125/65    Pulse (!) 101    Temp (!) 95.4 F (35.2 C) (Temporal)    Ht 5' (1.524 m)    Wt 211 lb 12.8 oz (96.1 kg)    SpO2 98%    BMI 41.36 kg/m   Wt Readings from Last 3 Encounters:  09/11/20 211 lb 12.8 oz (96.1 kg)  08/28/20 208 lb 9.6 oz (94.6 kg)  06/10/20 218 lb (98.9 kg)    Physical Exam Vitals reviewed.  Constitutional:      General: She is not in acute distress.    Appearance: Normal appearance. She is morbidly obese. She is not ill-appearing, toxic-appearing or diaphoretic.  HENT:     Head: Normocephalic and atraumatic.  Eyes:     General: No scleral icterus.       Right eye: No discharge.        Left eye: No discharge.     Conjunctiva/sclera: Conjunctivae normal.  Cardiovascular:     Rate and Rhythm: Normal rate and regular rhythm.     Heart sounds: Normal heart sounds. No murmur heard.  No friction rub. No gallop.    Pulmonary:     Effort: Pulmonary effort is normal. No respiratory distress.     Breath sounds: Normal breath sounds. No stridor. No wheezing, rhonchi or rales.  Musculoskeletal:        General: Normal range of motion.     Cervical back: Normal range of motion.  Skin:    General: Skin is warm and dry.     Capillary Refill: Capillary refill takes less than 2 seconds.  Neurological:     General: No focal deficit present.     Mental Status: She is alert and oriented to person, place, and time. Mental status is at baseline.     Gait: Gait abnormal (ambulating with walker).  Psychiatric:        Mood and Affect: Mood normal.        Behavior: Behavior normal.        Thought Content: Thought content normal.        Judgment: Judgment normal.     Lab Results  Component Value Date   TSH 0.90 05/26/2020   Lab Results  Component Value Date   WBC 14.1 (H) 08/21/2020   HGB 11.6 (L) 08/21/2020   HCT 37.9 08/21/2020   MCV 88.3 08/21/2020   PLT 365 08/21/2020   Lab Results  Component Value Date   NA 136 08/21/2020   K 4.3 08/21/2020   CO2 24 08/21/2020   GLUCOSE 117 (H) 08/21/2020   BUN 21 08/21/2020   CREATININE 1.00 08/21/2020   BILITOT 0.8 08/21/2020   ALKPHOS 173 (H) 08/21/2020   AST 26 08/21/2020   ALT 18 08/21/2020   PROT 7.5 08/21/2020   ALBUMIN 3.8 08/21/2020   CALCIUM 9.2 08/21/2020   ANIONGAP 13 08/21/2020   No results found for: CHOL No results found for: HDL No results found for: LDLCALC No results found for: TRIG No results found for: [facility] Lab Results  Component Value Date   HGBA1C 5.3 04/24/2019

## 2020-09-14 ENCOUNTER — Encounter: Payer: Self-pay | Admitting: Family Medicine

## 2020-09-14 DIAGNOSIS — G8929 Other chronic pain: Secondary | ICD-10-CM | POA: Insufficient documentation

## 2020-09-14 DIAGNOSIS — F419 Anxiety disorder, unspecified: Secondary | ICD-10-CM | POA: Insufficient documentation

## 2020-09-14 DIAGNOSIS — I700 Atherosclerosis of aorta: Secondary | ICD-10-CM | POA: Insufficient documentation

## 2020-09-26 DIAGNOSIS — M5136 Other intervertebral disc degeneration, lumbar region: Secondary | ICD-10-CM | POA: Diagnosis not present

## 2020-10-05 ENCOUNTER — Emergency Department (HOSPITAL_COMMUNITY)
Admission: EM | Admit: 2020-10-05 | Discharge: 2020-10-05 | Disposition: A | Discharge status: Home / Self Care | Payer: Medicare Other | Attending: Emergency Medicine | Admitting: Emergency Medicine

## 2020-10-05 ENCOUNTER — Encounter (HOSPITAL_COMMUNITY): Payer: Self-pay | Admitting: *Deleted

## 2020-10-05 DIAGNOSIS — E039 Hypothyroidism, unspecified: Secondary | ICD-10-CM | POA: Insufficient documentation

## 2020-10-05 DIAGNOSIS — R32 Unspecified urinary incontinence: Secondary | ICD-10-CM | POA: Diagnosis not present

## 2020-10-05 DIAGNOSIS — R202 Paresthesia of skin: Secondary | ICD-10-CM | POA: Insufficient documentation

## 2020-10-05 DIAGNOSIS — Z96653 Presence of artificial knee joint, bilateral: Secondary | ICD-10-CM | POA: Insufficient documentation

## 2020-10-05 DIAGNOSIS — R9431 Abnormal electrocardiogram [ECG] [EKG]: Secondary | ICD-10-CM | POA: Diagnosis not present

## 2020-10-05 DIAGNOSIS — Z79890 Hormone replacement therapy (postmenopausal): Secondary | ICD-10-CM | POA: Insufficient documentation

## 2020-10-05 LAB — CBC
HCT: 35.2 % — ABNORMAL LOW (ref 36.0–46.0)
Hemoglobin: 11.4 g/dL — ABNORMAL LOW (ref 12.0–15.0)
MCH: 27.5 pg (ref 26.0–34.0)
MCHC: 32.4 g/dL (ref 30.0–36.0)
MCV: 84.8 fL (ref 80.0–100.0)
Platelets: 316 10*3/uL (ref 150–400)
RBC: 4.15 MIL/uL (ref 3.87–5.11)
RDW: 13.8 % (ref 11.5–15.5)
WBC: 14.1 10*3/uL — ABNORMAL HIGH (ref 4.0–10.5)
nRBC: 0 % (ref 0.0–0.2)

## 2020-10-05 LAB — COMPREHENSIVE METABOLIC PANEL
ALT: 17 U/L (ref 0–44)
AST: 30 U/L (ref 15–41)
Albumin: 3.9 g/dL (ref 3.5–5.0)
Alkaline Phosphatase: 164 U/L — ABNORMAL HIGH (ref 38–126)
Anion gap: 11 (ref 5–15)
BUN: 20 mg/dL (ref 8–23)
CO2: 21 mmol/L — ABNORMAL LOW (ref 22–32)
Calcium: 9.3 mg/dL (ref 8.9–10.3)
Chloride: 106 mmol/L (ref 98–111)
Creatinine, Ser: 0.97 mg/dL (ref 0.44–1.00)
GFR, Estimated: >60 mL/min (ref >60)
Glucose, Bld: 117 mg/dL — ABNORMAL HIGH (ref 70–99)
Potassium: 4.3 mmol/L (ref 3.5–5.1)
Sodium: 138 mmol/L (ref 135–145)
Total Bilirubin: 1.1 mg/dL (ref 0.3–1.2)
Total Protein: 7.6 g/dL (ref 6.5–8.1)

## 2020-10-05 LAB — URINALYSIS, ROUTINE W REFLEX MICROSCOPIC
Bilirubin Urine: NEGATIVE
Glucose, UA: NEGATIVE mg/dL
Hgb urine dipstick: NEGATIVE
Ketones, ur: NEGATIVE mg/dL
Nitrite: NEGATIVE
Protein, ur: NEGATIVE mg/dL
Specific Gravity, Urine: 1.01 (ref 1.005–1.030)
pH: 8 (ref 5.0–8.0)

## 2020-10-05 MED ORDER — GABAPENTIN 100 MG PO CAPS
100.0000 mg | ORAL_CAPSULE | Freq: Once | ORAL | Status: AC
  Administered 2020-10-05: 100 mg via ORAL

## 2020-10-05 MED ORDER — DIPHENHYDRAMINE HCL 25 MG PO CAPS
25.0000 mg | ORAL_CAPSULE | Freq: Once | ORAL | Status: AC
  Administered 2020-10-05: 25 mg via ORAL
  Filled 2020-10-05: qty 1

## 2020-10-05 MED ORDER — FENTANYL CITRATE (PF) 100 MCG/2ML IJ SOLN
50.0000 ug | Freq: Once | INTRAMUSCULAR | Status: AC
  Administered 2020-10-05: 50 ug via INTRAVENOUS
  Filled 2020-10-05: qty 2

## 2020-10-05 MED ORDER — GABAPENTIN 100 MG PO CAPS: 100.0000 mg | ORAL_CAPSULE | Freq: Three times a day (TID) | ORAL | 0 refills | Status: DC

## 2020-10-05 NOTE — ED Notes (Signed)
Very anxious, states she has a history of back problems and has been advised she may need surgery, states she believes her numbness is coming from issues  With her back

## 2020-10-05 NOTE — ED Triage Notes (Signed)
Numbness in all extremities onset yesterday

## 2020-10-05 NOTE — Discharge Instructions (Signed)
Please stop taking Nucynta and tramadol, I would advise you to start taking gabapentin 3 times a day, you will need to follow-up with your doctor this week, please return to the emergency department for severe worsening symptoms, you may take Benadryl up to 25 mg every 6 hours as needed but be aware that this may make you sleepy.  I would expect her symptoms to continue for the next week but gradually improved.  I would also like you to see the neurologist, please see their number above, call for the next available appointment, they specialize in nerve disorders

## 2020-10-05 NOTE — ED Provider Notes (Signed)
[facility] EMERGENCY DEPARTMENT Provider Note   CSN: [id number] Arrival date & time: 10/05/20  1018     History Chief Complaint  Patient presents with  . Numbness    Andrea Martinez is a 74 y.o. female.  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  She has a history of back pain, this has been somewhat chronic in nature but was recently diagnosed with some low back problems and may be a pinched nerve, she was placed on Nucynta and tramadol, she has been on these for about 1 week but states that in the last 24 hours she has developed bilateral upper and lower extremity tingling and burning sensations from the elbows to the fingertips and from the knees through the toes.  This seems to be more anterior in the lower extremities, it is not associated with weakness but because of the pain she feels diffusely shaky.  She denies any significant systemic symptoms including fevers chills but is a little bit nauseated.  She has had some diarrhea as well.  She stopped taking those pain medications as listed above and has not had them in 24 hours.  She is struggling with her chronic back pain and is very frustrated by that.  She wonders if her symptoms could be related to her back pain.  Past Medical History:  Diagnosis Date  . Anxiety   . Arthritis   . 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 Gastric ulcer 2011   EGD, 5/11  . History of peptic ulcer disease   . Hypothyroidism    not on meds, followed by Dr. Elyse Hsu  . Lipoma    left upper arm  . Osteoporosis   . Pericardial effusion    a. HCAP complicated by pericardial effusion requiring pericardial window 10/2016 and large L pleural effusion requring VATS.  Marland Kitchen Pleural effusion    a. s/p VATS 2017.  Marland Kitchen Pneumonia 10/2016  . PONV (postoperative nausea and vomiting)   . Prolonged QT  interval   . Urine incontinence   . UTI (lower urinary tract infection)    frequent  . Vitamin D deficiency     Patient Active Problem List   Diagnosis Date Noted  . Atherosclerosis of aorta ([place]) 09/14/2020  . Anxiety 09/14/2020  . Chronic low back pain 09/14/2020  . Morbid obesity ([place]) 09/14/2020  . Thoracic compression fracture ([place]) 02/14/2019  . Sweating abnormality 10/11/2017  . Iron deficiency anemia 02/16/2017  . Solitary pulmonary nodule 10/23/2016  . Bladder prolapse, female, acquired 11/21/2015  . IBS (irritable bowel syndrome) 11/21/2015  . Neutrophilic leukocytosis 82/70/7867  . OA (osteoarthritis) of knee 11/21/2015  . CML (chronic myeloid leukemia) ([place]) 11/20/2015  . Dyspepsia 11/30/2012  . Hypothyroidism 02/11/2010    Past Surgical History:  Procedure Laterality Date  . BRAVO [place] STUDY  12/04/2012   Procedure: BRAVO [place];  Surgeon: [name], MD;  [facility];  Service: Endoscopy;  Laterality: N/A;  . CHOLECYSTECTOMY N/A 01/03/2014   Procedure: LAPAROSCOPIC CHOLECYSTECTOMY WITH INTRAOPERATIVE CHOLANGIOGRAM;  Surgeon: Joyice Faster. Cornett, MD;  Location: [place];  Service: General;  Laterality: N/A;  . COLONOSCOPY  01/2004   DR [facility], few small tics  . ESOPHAGOGASTRODUODENOSCOPY  02/2010   gastric ulcers  . ESOPHAGOGASTRODUODENOSCOPY  12/04/2012   JQG:BEEFEOF gastritis (inflammation) was found in the  gastric antrum; multiple biopsies The duodenal mucosa showed no abnormalities in the bulb and second portion of the duodenum  . JOINT REPLACEMENT Right 10/22/2019  . KYPHOPLASTY N/A 02/14/2019   Procedure: KYPHOPLASTY T12 and L1;  Surgeon: Melina Schools, MD;  Location: [place];  Service: Orthopedics;  Laterality: N/A;  120 mins  . LIPOMA EXCISION  08/02/2011   left shoulder  . NOSE SURGERY    . PARTIAL HYSTERECTOMY     vaginal at age 85 years of age  . TOE DEBRIDEMENT Left 1962   lt great toe  . TOTAL KNEE ARTHROPLASTY Right  10/22/2019   Procedure: TOTAL KNEE ARTHROPLASTY;  Surgeon: Gaynelle Arabian, MD;  Location: [facility];  Service: Orthopedics;  Laterality: Right;  18min  . TOTAL KNEE ARTHROPLASTY Left 03/03/2020   Procedure: TOTAL KNEE ARTHROPLASTY;  Surgeon: Gaynelle Arabian, MD;  Location: [facility];  Service: Orthopedics;  Laterality: Left;  29min  . VIDEO ASSISTED THORACOSCOPY (VATS)/EMPYEMA Left 10/25/2016   Procedure: VIDEO ASSISTED THORACOSCOPY (VATS), BRONCH,DRAINAGE OF PLEURAL EFFUSION,PERICARDIAL WINDOW WITH DRAINAGE OF PERICARDIAL FLUID, TEE;  Surgeon: [name], MD;  Location: [place];  Service: Thoracic;  Laterality: Left;  Marland Kitchen VIDEO BRONCHOSCOPY N/A 10/25/2016   Procedure: VIDEO BRONCHOSCOPY;  Surgeon: [name], MD;  Location: [place];  Service: Thoracic;  Laterality: N/A;     OB History   No obstetric history on file.     Family History  Problem Relation Age of Onset  . Lung cancer Father   . COPD Mother   . Stroke Mother   . Thyroid disease Mother   . Colon cancer Neg Hx   . Anesthesia problems Neg Hx   . Hypotension Neg Hx   . Malignant hyperthermia Neg Hx   . Pseudochol deficiency Neg Hx     Social History   Tobacco Use  . Smoking status: Never Smoker  . Smokeless tobacco: Never Used  Vaping Use  . Vaping Use: Never used  Substance Use Topics  . Alcohol use: No    Alcohol/week: 0.0 standard drinks  . Drug use: No    Home Medications Prior to Admission medications   Medication Sig Start Date End Date Taking? Authorizing Provider  acetaminophen (TYLENOL) 500 MG tablet Take 1,000 mg by mouth every 6 (six) hours as needed for mild pain or moderate pain.     [provider]  cetirizine (ZYRTEC) 10 MG tablet Take 10 mg by mouth at bedtime.     [provider]  Cholecalciferol (VITAMIN D3 PO) Take by mouth.    [provider]  escitalopram (LEXAPRO) 20 MG tablet Take 1.5 tablets (30 mg total) by mouth daily. 09/11/20   Loman Brooklyn, FNP   gabapentin (NEURONTIN) 100 MG capsule Take 1 capsule (100 mg total) by mouth 3 (three) times daily. 10/05/20 11/04/20  Noemi Chapel, MD  Lactobacillus-Inulin (PROBIOTIC DIGESTIVE SUPPORT PO) Take 2 tablets by mouth at bedtime. Gummie    [provider]  levothyroxine (SYNTHROID) 125 MCG tablet Take 1 tablet (125 mcg total) by mouth daily before breakfast. 06/02/20   Nida, Marella Chimes, MD  Multiple Vitamin (MULTIVITAMIN) tablet Take 1 tablet by mouth daily.    [provider]  multivitamin-iron-minerals-folic acid (CENTRUM) chewable tablet Chew 1 tablet by mouth daily.    [provider]  nilotinib (TASIGNA) 150 MG capsule Take 2 capsules (300 mg total) by mouth every 12 (twelve) hours. 05/06/20   Derek Jack, MD  vitamin B-12 (CYANOCOBALAMIN) 1000 MCG tablet  Take 1,000 mcg by mouth daily.    [provider]  vitamin E 180 MG (400 UNITS) capsule Take 400 Units by mouth daily.    [provider]    Allergies    Codeine, Sulfonamide derivatives, Esomeprazole magnesium, and Fluconazole  Review of Systems   Review of Systems  All other systems reviewed and are negative.   Physical Exam Updated Vital Signs BP (!) 148/94   Pulse 84   Temp 98.3 F (36.8 C) (Oral)   Resp (!) 21   Ht 1.651 m (5\' 5" )   Wt 94.3 kg   SpO2 98%   BMI 34.61 kg/m   Physical Exam Vitals and nursing note reviewed.  Constitutional:      Appearance: She is well-developed.     Comments: Uncomfortable appearing  HENT:     Head: Normocephalic and atraumatic.     Mouth/Throat:     Pharynx: No oropharyngeal exudate.  Eyes:     General: No scleral icterus.       Right eye: No discharge.        Left eye: No discharge.     Conjunctiva/sclera: Conjunctivae normal.     Pupils: Pupils are equal, round, and reactive to light.  Neck:     Thyroid: No thyromegaly.     Vascular: No JVD.  Cardiovascular:     Rate and Rhythm: Normal rate and regular rhythm.      Heart sounds: Normal heart sounds. No murmur heard.  No friction rub. No gallop.   Pulmonary:     Effort: Pulmonary effort is normal. No respiratory distress.     Breath sounds: Normal breath sounds. No wheezing or rales.  Abdominal:     General: Bowel sounds are normal. There is no distension.     Palpations: Abdomen is soft. There is no mass.     Tenderness: There is no abdominal tenderness.  Musculoskeletal:        General: No tenderness. Normal range of motion.     Cervical back: Normal range of motion and neck supple.     Comments: Obese but no pitting edema.  The patient is able to bilaterally move upper and lower extremities with supple joints and soft compartments.  She reports that when she tries to use her arms and her legs it hurts it is a burning sensation and located anteriorly.  There is no rashes in any of these areas  Lymphadenopathy:     Cervical: No cervical adenopathy.  Skin:    General: Skin is warm and dry.     Findings: No erythema or rash.  Neurological:     Mental Status: She is alert.     Coordination: Coordination normal.     Comments: Normal strength in all 4 extremities, she is limited only by pain, cranial nerves III through XII appear normal  Psychiatric:        Behavior: Behavior normal.     ED Results / Procedures / Treatments   Labs (all labs ordered are listed, but only abnormal results are displayed) Labs Reviewed  COMPREHENSIVE METABOLIC PANEL - Abnormal; Notable for the following components:      Result Value   CO2 21 (*)    Glucose, Bld 117 (*)    Alkaline Phosphatase 164 (*)    All other components within normal limits  CBC - Abnormal; Notable for the following components:   WBC 14.1 (*)    Hemoglobin 11.4 (*)    HCT 35.2 (*)  All other components within normal limits  URINALYSIS, ROUTINE W REFLEX MICROSCOPIC - Abnormal; Notable for the following components:   Leukocytes,Ua LARGE (*)    Bacteria, UA RARE (*)    All other components  within normal limits  URINE CULTURE    EKG EKG Interpretation  Date/Time:  Sunday October 05 2020 11:34:43 EDT Ventricular Rate:  87 PR Interval:    QRS Duration: 133 QT Interval:  373 QTC Calculation: 449 R Axis:   59 Text Interpretation: Sinus rhythm Short PR interval Probable left atrial enlargement Nonspecific intraventricular conduction delay Borderline repolarization abnormality since last tracing no significant change Confirmed by Noemi Chapel [phone]) on 10/05/2020 11:50:24 AM   Radiology No results found.  Procedures Procedures (including critical care time)  Medications Ordered in ED Medications  fentaNYL (SUBLIMAZE) injection 50 mcg (has no administration in time range)  diphenhydrAMINE (BENADRYL) capsule 25 mg (has no administration in time range)  gabapentin (NEURONTIN) capsule 100 mg (100 mg Oral Given 10/05/20 1218)    ED Course  I have reviewed the triage vital signs and the nursing notes.  Pertinent labs & imaging results that were available during my care of the patient were reviewed by me and considered in my medical decision making (see chart for details).    MDM Rules/Calculators/A&P                          This patient has no systemic symptoms, her EKG is unremarkable, her exam is consistent with what appears to be an acute neuropathy, I would consider this could be related to medications although she has been on the chemo for 5 years.  It could be related to the newer medication such as tramadol or Nucynta which she has stopped.  I will check some basic labs, give her some gabapentin, she will certainly need close follow-up.  This patient has been given 1 dose of gabapentin, she has a urinalysis that reveals 11-20 white blood cells, rare bacteria and no nitrites.  A culture will be obtained.  Comprehensive metabolic panel without significant findings, and a CBC shows a leukocytosis of 14,000 without any anemia of any concern or thrombocytopenia.  Her  EKG is unremarkable.  Her vital signs are unremarkable.  Vitals:   10/05/20 1122 10/05/20 1300 10/05/20 1330 10/05/20 1400  BP: (!) 152/70 (!) 147/58 (!) 151/60 (!) 148/94  Pulse: 91 82 82 84  Resp: 18 (!) 30 19 (!) 21  Temp: 98.3 F (36.8 C)     TempSrc: Oral     SpO2: 90% 99% 97% 98%  Weight: 94.3 kg     Height: 1.651 m (5\' 5" )      On my exam the patient is not tachypneic, she has clear heart sounds, she has normal muscular exam, normal neurologic exam except for her hypersensitivity to touch in all 4 extremities.  It is not clear exactly why she has this hyperesthesias however I think would be reasonable to refer this patient to neurology, she will be started on gabapentin at a low dose and follow-up with her family doctor.  The patient is agreeable.  She will be given 1 dose of fentanyl at 50 mcg prior to discharge.  I will also give her some Benadryl as she has an itching component as well.  It is not consistent with urticaria and there is no rash on the skin  I have advised the patient to stop taking the Nucynta as well as  the tramadol, she will be given a prescription for gabapentin  Final Clinical Impression(s) / ED Diagnoses Final diagnoses:  Paresthesias    Rx / DC Orders ED Discharge Orders         Ordered    gabapentin (NEURONTIN) 100 MG capsule  3 times daily        10/05/20 1455           Noemi Chapel, MD 10/05/20 1456

## 2020-10-07 LAB — URINE CULTURE: Culture: <10000 — AB

## 2020-10-14 DIAGNOSIS — G8929 Other chronic pain: Secondary | ICD-10-CM | POA: Diagnosis not present

## 2020-10-14 DIAGNOSIS — M545 Low back pain, unspecified: Secondary | ICD-10-CM | POA: Diagnosis not present

## 2020-10-17 DIAGNOSIS — M488X9 Other specified spondylopathies, site unspecified: Secondary | ICD-10-CM | POA: Diagnosis not present

## 2020-10-17 DIAGNOSIS — M533 Sacrococcygeal disorders, not elsewhere classified: Secondary | ICD-10-CM | POA: Diagnosis not present

## 2020-10-17 DIAGNOSIS — E539 Vitamin B deficiency, unspecified: Secondary | ICD-10-CM | POA: Diagnosis not present

## 2020-10-17 DIAGNOSIS — E559 Vitamin D deficiency, unspecified: Secondary | ICD-10-CM | POA: Diagnosis not present

## 2020-10-17 DIAGNOSIS — M064 Inflammatory polyarthropathy: Secondary | ICD-10-CM | POA: Diagnosis not present

## 2020-10-23 DIAGNOSIS — M47816 Spondylosis without myelopathy or radiculopathy, lumbar region: Secondary | ICD-10-CM | POA: Diagnosis not present

## 2020-11-20 ENCOUNTER — Telehealth (HOSPITAL_COMMUNITY): Payer: Self-pay | Admitting: Pharmacy Technician

## 2020-11-20 NOTE — Telephone Encounter (Signed)
Oral Oncology Patient Advocate Encounter  Received communication from [employer] that the patient's eligibility in the patient assistance program for Tasigna was due for re-enrollment.    Coordinated with patient to sign application on 05/14/36.  MD portion completed 11/20/20.  The renewal application has been completed and faxed in an effort to keep the patient's out of pocket expense for Tasigna at $0.     Application completed and faxed to [phone].    [employer] patient assistance phone number for follow up is [phone].    This encounter will be updated until final determination.  [address] Patient [place] Phone [phone] Fax [phone] 11/20/2020 4:02 PM

## 2020-11-21 DIAGNOSIS — M545 Low back pain, unspecified: Secondary | ICD-10-CM | POA: Diagnosis not present

## 2020-11-25 DIAGNOSIS — M545 Low back pain, unspecified: Secondary | ICD-10-CM | POA: Diagnosis not present

## 2020-12-11 ENCOUNTER — Telehealth (HOSPITAL_COMMUNITY): Payer: Self-pay

## 2020-12-11 NOTE — Telephone Encounter (Signed)
Patient called in and stated that she is having her follow up MRI on her back soon and they have given her pain medication and given her a one time dose of Valium.  Patient stated that she wanted to check and see if this medication will interfere with her lab work for her Leukemia.  Patient was advised that it will be ok to take her pain medication and Diazepam to have her procedure.  No further questions or concerns at this time.

## 2020-12-16 DIAGNOSIS — M5416 Radiculopathy, lumbar region: Secondary | ICD-10-CM | POA: Diagnosis not present

## 2020-12-23 DIAGNOSIS — M5416 Radiculopathy, lumbar region: Secondary | ICD-10-CM | POA: Diagnosis not present

## 2020-12-24 ENCOUNTER — Other Ambulatory Visit: Payer: Self-pay

## 2020-12-24 ENCOUNTER — Inpatient Hospital Stay (HOSPITAL_COMMUNITY): Payer: Medicare Other | Attending: Hematology

## 2020-12-24 ENCOUNTER — Inpatient Hospital Stay (HOSPITAL_COMMUNITY): Admission: RE | Admit: 2020-12-24 | Payer: Medicare Other | Source: Ambulatory Visit

## 2020-12-24 ENCOUNTER — Other Ambulatory Visit (HOSPITAL_COMMUNITY): Payer: Self-pay | Admitting: *Deleted

## 2020-12-24 DIAGNOSIS — Z79899 Other long term (current) drug therapy: Secondary | ICD-10-CM | POA: Diagnosis not present

## 2020-12-24 DIAGNOSIS — C9210 Chronic myeloid leukemia, BCR/ABL-positive, not having achieved remission: Secondary | ICD-10-CM | POA: Insufficient documentation

## 2020-12-24 DIAGNOSIS — E039 Hypothyroidism, unspecified: Secondary | ICD-10-CM | POA: Insufficient documentation

## 2020-12-24 DIAGNOSIS — Z801 Family history of malignant neoplasm of trachea, bronchus and lung: Secondary | ICD-10-CM | POA: Diagnosis not present

## 2020-12-24 DIAGNOSIS — E559 Vitamin D deficiency, unspecified: Secondary | ICD-10-CM | POA: Insufficient documentation

## 2020-12-24 DIAGNOSIS — M545 Low back pain, unspecified: Secondary | ICD-10-CM | POA: Insufficient documentation

## 2020-12-24 DIAGNOSIS — D649 Anemia, unspecified: Secondary | ICD-10-CM | POA: Diagnosis not present

## 2020-12-24 LAB — CBC WITH DIFFERENTIAL/PLATELET
Abs Immature Granulocytes: 0.04 10*3/uL (ref 0.00–0.07)
Basophils Absolute: 0 10*3/uL (ref 0.0–0.1)
Basophils Relative: 0 %
Eosinophils Absolute: 0.2 10*3/uL (ref 0.0–0.5)
Eosinophils Relative: 2 %
HCT: 35.5 % — ABNORMAL LOW (ref 36.0–46.0)
Hemoglobin: 10.9 g/dL — ABNORMAL LOW (ref 12.0–15.0)
Immature Granulocytes: 0 %
Lymphocytes Relative: 14 %
Lymphs Abs: 1.3 10*3/uL (ref 0.7–4.0)
MCH: 27.2 pg (ref 26.0–34.0)
MCHC: 30.7 g/dL (ref 30.0–36.0)
MCV: 88.5 fL (ref 80.0–100.0)
Monocytes Absolute: 0.7 10*3/uL (ref 0.1–1.0)
Monocytes Relative: 8 %
Neutro Abs: 6.9 10*3/uL (ref 1.7–7.7)
Neutrophils Relative %: 76 %
Platelets: 278 10*3/uL (ref 150–400)
RBC: 4.01 MIL/uL (ref 3.87–5.11)
RDW: 13.4 % (ref 11.5–15.5)
WBC: 9.1 10*3/uL (ref 4.0–10.5)
nRBC: 0 % (ref 0.0–0.2)

## 2020-12-24 LAB — COMPREHENSIVE METABOLIC PANEL
ALT: 15 U/L (ref 0–44)
AST: 28 U/L (ref 15–41)
Albumin: 4 g/dL (ref 3.5–5.0)
Alkaline Phosphatase: 138 U/L — ABNORMAL HIGH (ref 38–126)
Anion gap: 8 (ref 5–15)
BUN: 18 mg/dL (ref 8–23)
CO2: 25 mmol/L (ref 22–32)
Calcium: 9.1 mg/dL (ref 8.9–10.3)
Chloride: 105 mmol/L (ref 98–111)
Creatinine, Ser: 0.85 mg/dL (ref 0.44–1.00)
GFR, Estimated: >60 mL/min (ref >60)
Glucose, Bld: 104 mg/dL — ABNORMAL HIGH (ref 70–99)
Potassium: 4.4 mmol/L (ref 3.5–5.1)
Sodium: 138 mmol/L (ref 135–145)
Total Bilirubin: 0.8 mg/dL (ref 0.3–1.2)
Total Protein: 7.2 g/dL (ref 6.5–8.1)

## 2020-12-24 MED ORDER — NILOTINIB HCL 150 MG PO CAPS: 300.0000 mg | ORAL_CAPSULE | Freq: Two times a day (BID) | ORAL | 6 refills | Status: DC

## 2020-12-24 NOTE — Telephone Encounter (Signed)
Chart reviewed, tasigna refilled per MD note

## 2020-12-31 ENCOUNTER — Inpatient Hospital Stay (HOSPITAL_BASED_OUTPATIENT_CLINIC_OR_DEPARTMENT_OTHER): Payer: Medicare Other | Admitting: Hematology

## 2020-12-31 ENCOUNTER — Other Ambulatory Visit: Payer: Self-pay

## 2020-12-31 ENCOUNTER — Inpatient Hospital Stay (HOSPITAL_COMMUNITY): Payer: Medicare Other

## 2020-12-31 VITALS — BP 122/54 | HR 76 | Temp 97.1°F | Resp 19 | Wt 202.6 lb

## 2020-12-31 DIAGNOSIS — Z79899 Other long term (current) drug therapy: Secondary | ICD-10-CM | POA: Diagnosis not present

## 2020-12-31 DIAGNOSIS — D649 Anemia, unspecified: Secondary | ICD-10-CM | POA: Diagnosis not present

## 2020-12-31 DIAGNOSIS — E559 Vitamin D deficiency, unspecified: Secondary | ICD-10-CM | POA: Diagnosis not present

## 2020-12-31 DIAGNOSIS — E039 Hypothyroidism, unspecified: Secondary | ICD-10-CM | POA: Diagnosis not present

## 2020-12-31 DIAGNOSIS — Z801 Family history of malignant neoplasm of trachea, bronchus and lung: Secondary | ICD-10-CM | POA: Diagnosis not present

## 2020-12-31 DIAGNOSIS — C9210 Chronic myeloid leukemia, BCR/ABL-positive, not having achieved remission: Secondary | ICD-10-CM

## 2020-12-31 DIAGNOSIS — M545 Low back pain, unspecified: Secondary | ICD-10-CM | POA: Diagnosis not present

## 2020-12-31 NOTE — Progress Notes (Signed)
[place], [place] [address]   CLINIC:  Medical Oncology/Hematology  PCP:  Loman [place], [place] / [place] [address]  [phone]  REASON FOR VISIT:  Follow-up for CML  PRIOR THERAPY: None  CURRENT THERAPY: Tasigna 300 mg Q 12 H  INTERVAL HISTORY:  [name], a 75 y.o. female, returns for routine follow-up for her CML. [place] was last seen on 08/28/2020.  Today she is accompanied by her husband and she reports feeling okay. She complains of having ongoing lower back pain and will be going back to see Dr. Rolena Infante for the results of her MRI. Her knees continue being sore since she did not finish the full course of PT. She denies having any nosebleeds, hematuria or hematochezia. She is tolerating the Tasigna well and denies abdominal pain or N/V. She continues having urinary incontinence and was offered to have surgery, but has elected to not do surgery just yet due to her back pain. She denies having any recent infections, F/C or night sweats.   REVIEW OF SYSTEMS:  Review of Systems  Constitutional: Positive for appetite change (75%) and fatigue (50%). Negative for chills, diaphoresis and fever.  HENT:   Negative for nosebleeds.   Gastrointestinal: Negative for abdominal pain, blood in stool, nausea and vomiting.  Genitourinary: Positive for bladder incontinence. Negative for hematuria.   Musculoskeletal: Positive for back pain (6/10 low back pain).  Psychiatric/Behavioral: Positive for depression and sleep disturbance.  All other systems reviewed and are negative.   PAST MEDICAL/SURGICAL HISTORY:  Past Medical History:  Diagnosis Date  . Anxiety   . Arthritis   . Chronic myelogenous leukemia (CML), BCR-ABL1-positive ([place]) 11/20/2015  . Gastric ulcer 2011   EGD, 5/11  . History of peptic ulcer disease   . Hypothyroidism    not on meds, followed by Dr. Elyse Hsu  . Lipoma    left upper arm  . Osteoporosis   .  Pericardial effusion    a. HCAP complicated by pericardial effusion requiring pericardial window 10/2016 and large L pleural effusion requring VATS.  Marland Kitchen Pleural effusion    a. s/p VATS 2017.  Marland Kitchen Pneumonia 10/2016  . PONV (postoperative nausea and vomiting)   . Prolonged QT interval   . Urine incontinence   . UTI (lower urinary tract infection)    frequent  . Vitamin D deficiency    Past Surgical History:  Procedure Laterality Date  . BRAVO [place] STUDY  12/04/2012   Procedure: BRAVO [place];  Surgeon: [name], MD;  [facility];  Service: Endoscopy;  Laterality: N/A;  . CHOLECYSTECTOMY N/A 01/03/2014   Procedure: LAPAROSCOPIC CHOLECYSTECTOMY WITH INTRAOPERATIVE CHOLANGIOGRAM;  Surgeon: Joyice Faster. Cornett, MD;  Location: [place];  Service: General;  Laterality: N/A;  . COLONOSCOPY  01/2004   DR [facility], few small tics  . ESOPHAGOGASTRODUODENOSCOPY  02/2010   gastric ulcers  . ESOPHAGOGASTRODUODENOSCOPY  12/04/2012   TRZ:NBVAPOL gastritis (inflammation) was found in the gastric antrum; multiple biopsies The duodenal mucosa showed no abnormalities in the bulb and second portion of the duodenum  . JOINT REPLACEMENT Right 10/22/2019  . KYPHOPLASTY N/A 02/14/2019   Procedure: KYPHOPLASTY T12 and L1;  Surgeon: Melina Schools, MD;  Location: [place];  Service: Orthopedics;  Laterality: N/A;  120 mins  . LIPOMA EXCISION  08/02/2011   left shoulder  . NOSE SURGERY    . PARTIAL HYSTERECTOMY  vaginal at age 60 years of age  . TOE DEBRIDEMENT Left 1962   lt great toe  . TOTAL KNEE ARTHROPLASTY Right 10/22/2019   Procedure: TOTAL KNEE ARTHROPLASTY;  Surgeon: Gaynelle Arabian, MD;  [facility]: [facility];  Service: Orthopedics;  Laterality: Right;  41mn  . TOTAL KNEE ARTHROPLASTY Left 03/03/2020   Procedure: TOTAL KNEE ARTHROPLASTY;  Surgeon: AGaynelle Arabian MD;  Location[facility];  Service: Orthopedics;  Laterality: Left;  535m  . VIDEO ASSISTED THORACOSCOPY (VATS)/EMPYEMA Left  10/25/2016   Procedure: VIDEO ASSISTED THORACOSCOPY (VATS), BRONCH,DRAINAGE OF PLEURAL EFFUSION,PERICARDIAL WINDOW WITH DRAINAGE OF PERICARDIAL FLUID, TEE;  Surgeon: StMelrose NakayamaMD;  [facility] Service: Thoracic;  Laterality: Left;  . Marland KitchenIDEO BRONCHOSCOPY N/A 10/25/2016   Procedure: VIDEO BRONCHOSCOPY;  Surgeon: StMelrose NakayamaMD;  [facility] Service: Thoracic;  Laterality: N/A;    SOCIAL HISTORY:  Social History   Socioeconomic History  . Marital status: Married    Spouse name: Not on file  . Number of children: 2  . Years of education: 12th grade  . Highest education level: Not on file  Occupational History  . 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UENPLOYED    Employer: RETIRED  Tobacco Use  . Smoking status: Never Smoker  . Smokeless tobacco: Never Used  Vaping Use  . Vaping Use: Never used  Substance and Sexual Activity  . Alcohol use: No    Alcohol/week: 0.0 standard drinks  . Drug use: No  . Sexual activity: Yes    Partners: Male    Birth control/protection: None  Other Topics Concern  . Not on file  Social History Narrative  . Not on file   Social Determinants of Health   Financial Resource Strain: Not on file  Food Insecurity: Not on file  Transportation Needs: Not on file  Physical Activity: Not on file  Stress: Not on file  Social Connections: Not on file  Intimate Partner Violence: Not on file    FAMILY HISTORY:  Family History  Problem Relation Age of Onset  . Lung cancer Father   . COPD Mother   . Stroke Mother   . Thyroid disease Mother   . Colon cancer Neg Hx   . Anesthesia problems Neg Hx   . Hypotension Neg Hx   . Malignant hyperthermia Neg Hx   . Pseudochol deficiency Neg Hx     CURRENT MEDICATIONS:  Current Outpatient Medications  Medication Sig Dispense Refill  . acetaminophen (TYLENOL) 500 MG tablet Take 1,000 mg by mouth every 6 (six) hours as needed for mild pain or moderate pain.     . cetirizine (ZYRTEC)  10 MG tablet Take 10 mg by mouth at bedtime.    . Cholecalciferol (VITAMIN D3 PO) Take by mouth.    . escitalopram (LEXAPRO) 20 MG tablet Take 1.5 tablets (30 mg total) by mouth daily. 135 tablet 1  . HYDROcodone-acetaminophen (NORCO) 10-325 MG tablet Take 1 tablet by mouth 3 (three) times daily as needed.    . Lactobacillus-Inulin (PROBIOTIC DIGESTIVE SUPPORT PO) Take 2 tablets by mouth at bedtime. Gummie    . levothyroxine (SYNTHROID) 125 MCG tablet Take 1 tablet (125 mcg total) by mouth daily before breakfast. 90 tablet 3  . Multiple Vitamin (MULTIVITAMIN) tablet Take 1 tablet by mouth daily.    . multivitamin-iron-minerals-folic acid (CENTRUM) chewable tablet Chew 1 tablet by mouth daily.    . nilotinib (TASIGNA) 150 MG capsule Take 2 capsules (300 mg total) by  mouth every 12 (twelve) hours. 120 capsule 6  . vitamin B-12 (CYANOCOBALAMIN) 1000 MCG tablet Take 1,000 mcg by mouth daily.    . vitamin E 180 MG (400 UNITS) capsule Take 400 Units by mouth daily.    Marland Kitchen gabapentin (NEURONTIN) 100 MG capsule Take 1 capsule (100 mg total) by mouth 3 (three) times daily. 90 capsule 0   No current facility-administered medications for this visit.    ALLERGIES:  Allergies  Allergen Reactions  . Nucynta [Tapentadol Hcl] Swelling  . Codeine Nausea Only and Other (See Comments)    Headache  . Sulfonamide Derivatives Nausea Only and Other (See Comments)    Headache  . Esomeprazole Magnesium Nausea Only  . Fluconazole Rash    Redness and blistering on left thigh    PHYSICAL EXAM:  Performance status (ECOG): 2 - Symptomatic, <50% confined to bed  Vitals:   12/31/20 1409  BP: (!) 122/54  Pulse: 76  Resp: 19  Temp: (!) 97.1 F (36.2 C)  SpO2: 99%   Wt Readings from Last 3 Encounters:  12/31/20 202 lb 9.6 oz (91.9 kg)  10/05/20 208 lb (94.3 kg)  09/11/20 211 lb 12.8 oz (96.1 kg)   Physical Exam Vitals reviewed.  Constitutional:      Appearance: Normal appearance. She is obese.   Cardiovascular:     Rate and Rhythm: Normal rate and regular rhythm.     Pulses: Normal pulses.     Heart sounds: Normal heart sounds.  Pulmonary:     Effort: Pulmonary effort is normal.     Breath sounds: Normal breath sounds.  Abdominal:     Palpations: Abdomen is soft. There is no hepatomegaly or mass.     Tenderness: There is no abdominal tenderness.  Musculoskeletal:     Right lower leg: Edema (1+) present.     Left lower leg: Edema (1+) present.  Neurological:     General: No focal deficit present.     Mental Status: She is alert and oriented to person, place, and time.  Psychiatric:        Mood and Affect: Mood normal.        Behavior: Behavior normal.     LABORATORY DATA:  I have reviewed the labs as listed.  CBC Latest Ref Rng & Units 12/24/2020 10/05/2020 08/21/2020  WBC 4.0 - 10.5 K/uL 9.1 14.1(H) 14.1(H)  Hemoglobin 12.0 - 15.0 g/dL 10.9(L) 11.4(L) 11.6(L)  Hematocrit 36.0 - 46.0 % 35.5(L) 35.2(L) 37.9  Platelets 150 - 400 K/uL 278 316 365   CMP Latest Ref Rng & Units 12/24/2020 10/05/2020 08/21/2020  Glucose 70 - 99 mg/dL 104(H) 117(H) 117(H)  BUN 8 - 23 mg/dL '18 20 21  ' Creatinine 0.44 - 1.00 mg/dL 0.85 0.97 1.00  Sodium 135 - 145 mmol/L 138 138 136  Potassium 3.5 - 5.1 mmol/L 4.4 4.3 4.3  Chloride 98 - 111 mmol/L 105 106 99  CO2 22 - 32 mmol/L 25 21(L) 24  Calcium 8.9 - 10.3 mg/dL 9.1 9.3 9.2  Total Protein 6.5 - 8.1 g/dL 7.2 7.6 7.5  Total Bilirubin 0.3 - 1.2 mg/dL 0.8 1.1 0.8  Alkaline Phos 38 - 126 U/L 138(H) 164(H) 173(H)  AST 15 - 41 U/L '28 30 26  ' ALT 0 - 44 U/L '15 17 18      ' Component Value Date/Time   RBC 4.01 12/24/2020 1318   MCV 88.5 12/24/2020 1318   MCH 27.2 12/24/2020 1318   MCHC 30.7 12/24/2020 1318   RDW  13.4 12/24/2020 1318   LYMPHSABS 1.3 12/24/2020 1318   MONOABS 0.7 12/24/2020 1318   EOSABS 0.2 12/24/2020 1318   BASOSABS 0.0 12/24/2020 1318    DIAGNOSTIC IMAGING:  I have independently reviewed the scans and discussed with the  patient. No results found.   ASSESSMENT:  1. 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 -Left pleural effusion and pericardial effusion requiring pericardial window on 10/25/2016. Fluid cytology negative. -Started back on Nilotinib 300 mg every 12 hours on 12/06/2016.  2. Daytime sweats: -He had a long history of daytime sweats. She had extensive work-up including a CT scan CAP, which showed no evidence of malignancy. Hepatic morphology suspicious for cirrhosis.   PLAN:  1. CML in chronic phase: -She is taking Nilotinib 300 mg twice daily. - No major complaints from the pills.  Reviewed labs from 12/24/2020.  Alk phos was mildly elevated at 138.  Rest of the LFTs were normal.  CBC shows hemoglobin of 10.9, likely from myelosuppression.  White count is normal at 9.1 with normal differential. - We will send BCR/ABL by quantitative PCR. - If the PCR is undetectable, we will follow her in 6 months.  2.Low back pain: -She is having worsening low back pain since both knees were replaced. - She is following up with back specialist and will have an MRI soon.  3.  Normocytic anemia: - Hemoglobin is 10.9 with MCV 88.5. - She does not have any bleeding per rectum or melena. - Likely from myelosuppression from Nilotinib.  Orders placed this encounter:  Orders Placed This Encounter  Procedures  . BCR-ABL1, CML/ALL, PCR, QUANT  . CBC with Differential  . Comprehensive metabolic panel  . BCR-ABL1, CML/ALL, PCR, QUANT     Derek Jack, MD [place] [phone]   I, Milinda Antis, am acting as a scribe for Dr. Sanda Linger.  I, Derek Jack MD, have reviewed the above documentation for accuracy and completeness, and I agree with the above.

## 2020-12-31 NOTE — Patient Instructions (Signed)
[place] at [facility] Discharge Instructions  You were seen today by Dr. Delton Coombes. He went over your recent results. You had blood work drawn today for further analysis. Dr. Delton Coombes will see you back in 6 months for labs and follow up.   Thank you for choosing [place] at [facility] to provide your oncology and hematology care.  To afford each patient quality time with our provider, please arrive at least 15 minutes before your scheduled appointment time.   If you have a lab appointment with the [facility] please come in thru the Main Entrance and check in at the main information desk  You need to re-schedule your appointment should you arrive 10 or more minutes late.  We strive to give you quality time with our providers, and arriving late affects you and other patients whose appointments are after yours.  Also, if you no show three or more times for appointments you may be dismissed from the clinic at the providers discretion.     Again, thank you for choosing [facility].  Our hope is that these requests will decrease the amount of time that you wait before being seen by our physicians.       _____________________________________________________________  Should you have questions after your visit to [facility], please contact our office at [phone] between the hours of 8:00 a.m. and 4:30 p.m.  Voicemails left after 4:00 p.m. will not be returned until the following business day.  For prescription refill requests, have your pharmacy contact our office and allow 72 hours.    Cancer Center Support Programs:   > Cancer Support Group  2nd Tuesday of the month 1pm-2pm, Journey Room

## 2021-01-02 DIAGNOSIS — M48062 Spinal stenosis, lumbar region with neurogenic claudication: Secondary | ICD-10-CM | POA: Diagnosis not present

## 2021-01-06 ENCOUNTER — Ambulatory Visit: Payer: Self-pay | Admitting: Orthopedic Surgery

## 2021-01-07 ENCOUNTER — Telehealth: Payer: Self-pay | Admitting: "Endocrinology

## 2021-01-07 DIAGNOSIS — Z23 Encounter for immunization: Secondary | ICD-10-CM | POA: Diagnosis not present

## 2021-01-07 NOTE — Telephone Encounter (Signed)
Left a message requesting a return call to the office. 

## 2021-01-07 NOTE — Telephone Encounter (Signed)
Patient is calling and states she is having back surgery at [facility] and they will be sending a consent form for [place] to fill out that it is okay for pt to have surgery. She states the office told her to call here and make sure that we put on the list the patient is still supposed to continue taking Levothyroxine. Please advise. Pt would like to know if we have received this, as she is sch for 01/28/21

## 2021-01-09 ENCOUNTER — Encounter: Payer: Self-pay | Admitting: Family Medicine

## 2021-01-09 ENCOUNTER — Ambulatory Visit (INDEPENDENT_AMBULATORY_CARE_PROVIDER_SITE_OTHER): Payer: Medicare Other | Admitting: Family Medicine

## 2021-01-09 ENCOUNTER — Other Ambulatory Visit: Payer: Self-pay

## 2021-01-09 VITALS — BP 112/60 | HR 84 | Temp 95.8°F | Ht 65.0 in | Wt 205.2 lb

## 2021-01-09 DIAGNOSIS — F419 Anxiety disorder, unspecified: Secondary | ICD-10-CM

## 2021-01-09 DIAGNOSIS — M488X9 Other specified spondylopathies, site unspecified: Secondary | ICD-10-CM | POA: Diagnosis not present

## 2021-01-09 DIAGNOSIS — E559 Vitamin D deficiency, unspecified: Secondary | ICD-10-CM | POA: Diagnosis not present

## 2021-01-09 DIAGNOSIS — G8929 Other chronic pain: Secondary | ICD-10-CM

## 2021-01-09 DIAGNOSIS — M545 Low back pain, unspecified: Secondary | ICD-10-CM

## 2021-01-09 DIAGNOSIS — I700 Atherosclerosis of aorta: Secondary | ICD-10-CM

## 2021-01-09 DIAGNOSIS — N8110 Cystocele, unspecified: Secondary | ICD-10-CM | POA: Diagnosis not present

## 2021-01-09 DIAGNOSIS — M064 Inflammatory polyarthropathy: Secondary | ICD-10-CM

## 2021-01-09 DIAGNOSIS — E539 Vitamin B deficiency, unspecified: Secondary | ICD-10-CM | POA: Diagnosis not present

## 2021-01-09 MED ORDER — GABAPENTIN 100 MG PO CAPS: 100.0000 mg | ORAL_CAPSULE | Freq: Three times a day (TID) | ORAL | 1 refills | Status: DC

## 2021-01-09 MED ORDER — ESCITALOPRAM OXALATE 20 MG PO TABS: 30.0000 mg | ORAL_TABLET | Freq: Every day | ORAL | 1 refills | Status: DC

## 2021-01-09 NOTE — Progress Notes (Signed)
Assessment & Plan:  1. Anxiety - Well controlled on current regimen.  - escitalopram (LEXAPRO) 20 MG tablet; Take 1.5 tablets (30 mg total) by mouth daily.  Dispense: 135 tablet; Refill: 1  2. Atherosclerosis of aorta ([place]) - Labs to assess. Needs statin. - Lipid panel  3. Bladder prolapse, female, acquired - Patient will let me know when she is ready for a referral [facility].  4. Chronic low back pain, unspecified back pain laterality, unspecified whether sciatica present - Pre-op clearance form to be completed. Upcoming surgery.  5. Vitamin D deficiency - Labs requested by [facility] that will be faxed over to them.  - VITAMIN D 25 Hydroxy (Vit-D Deficiency, Fractures)  6. Vitamin B deficiency - Labs requested by [facility] that will be faxed over to them.  - Vitamin B12  7. Other specified spondylopathies, site unspecified ([place]) - Labs requested by [facility] that will be faxed over to them.  - HLA-B27 Antigen  8. Inflammatory polyarthropathy ([place]) - Labs requested by [facility] that will be faxed over to them.  - Sedimentation rate - Rheumatoid factor - ANA,IFA RA Diag Pnl w/rflx Tit/Patn - Uric acid - Cyclic Citrul Peptide Antibody, IGG - C-reactive protein - Homocysteine - CYCLIC CITRUL PEPTIDE ANTIBODY, IGG/IGA   Return in about 4 months (around 05/09/2021) for follow-up of chronic medication conditions.  Hendricks Limes, MSN, APRN, FNP-C [facility]  Subjective:    Patient ID: Andrea Martinez, female    DOB: [birth year], 75 y.o.   MRN: [id number]  Patient Care Team: Loman Brooklyn, FNP as PCP - General (Family Medicine) Herminio Commons, MD (Inactive) as PCP - Cardiology (Cardiology) Richmond Campbell, MD as Referring Physician (Gastroenterology) Derek Jack, MD as Medical Oncologist (Medical Oncology) Cassandria Anger, MD as Consulting Physician (Endocrinology)   Chief Complaint:  Chief Complaint   Patient presents with  . Anxiety    4 month follow up of chronic medical conditions     HPI: Andrea Martinez is a 75 y.o. female presenting on 01/09/2021 for Anxiety (4 month follow up of chronic medical conditions/)  Patient was given an order to have her lipid panel drawn at the hospital when she had her lab work completed for her oncologist, but she reports she was unable to get them to do it.  She is willing to have labs drawn with [place] today.  She is requesting with our lab draws that we draw lab work that was also requested by Levy Pupa, PA at [facility], since the hospital would not do this either.  Patient is going to have a lumbar fusion completed next month.  She is hopeful that she will be up walking around and doing well by May when one of her granddaughters is graduating.  She has another granddaughter graduating in December of this year.  She did bring a form for surgical clearance today to be completed for the procedure.  Patient is very frustrated with her bladder been wearing depends.  She is ready mentally to have surgery, but would like to get her back fixed first.  When she is ready for a referral she would like to go to [facility] urology.  Depression screen [facility] 2/9 01/09/2021 09/11/2020 06/10/2020  Decreased Interest _0 Down, Depressed, Hopeless 0 1 1  PHQ - 2 Score _1 Altered sleeping _2 Tired, decreased energy _3 Change in appetite 0 0 1  Feeling  bad or failure about yourself  0 1 0  Trouble concentrating 0 1 0  Moving slowly or fidgety/restless 0 0 0  Suicidal thoughts 0 0 0  PHQ-9 Score _0 Difficult doing work/chores Not difficult at all - -  Some recent data might be hidden   GAD 7 : Generalized Anxiety Score 01/09/2021 09/11/2020 06/10/2020  Nervous, Anxious, on Edge 0 0 0  Control/stop worrying 1 2 0  Worry too much - different things 0 2 1  Trouble relaxing 0 1 1  Restless 0 1 0  Easily annoyed or irritable 0 1 0  Afraid - awful  might happen 0 0 0  Total GAD 7 Score _1 Anxiety Difficulty Not difficult at all - -    Social history:  Relevant past medical, surgical, family and social history reviewed and updated as indicated. Interim medical history since our last visit reviewed.  Allergies and medications reviewed and updated.  DATA REVIEWED: CHART IN EPIC  ROS: Negative unless specifically indicated above in HPI.    Current Outpatient Medications:  .  acetaminophen (TYLENOL) 500 MG tablet, Take 1,000 mg by mouth every 6 (six) hours as needed for mild pain or moderate pain. , Disp: , Rfl:  .  cetirizine (ZYRTEC) 10 MG tablet, Take 10 mg by mouth at bedtime., Disp: , Rfl:  .  Cholecalciferol (VITAMIN D3 PO), Take by mouth., Disp: , Rfl:  .  escitalopram (LEXAPRO) 20 MG tablet, Take 1.5 tablets (30 mg total) by mouth daily., Disp: 135 tablet, Rfl: 1 .  HYDROcodone-acetaminophen (NORCO) 10-325 MG tablet, Take 1 tablet by mouth 3 (three) times daily as needed., Disp: , Rfl:  .  Lactobacillus-Inulin (PROBIOTIC DIGESTIVE SUPPORT PO), Take 2 tablets by mouth at bedtime. Gummie, Disp: , Rfl:  .  levothyroxine (SYNTHROID) 125 MCG tablet, Take 1 tablet (125 mcg total) by mouth daily before breakfast., Disp: 90 tablet, Rfl: 3 .  Multiple Vitamin (MULTIVITAMIN) tablet, Take 1 tablet by mouth daily., Disp: , Rfl:  .  multivitamin-iron-minerals-folic acid (CENTRUM) chewable tablet, Chew 1 tablet by mouth daily., Disp: , Rfl:  .  nilotinib (TASIGNA) 150 MG capsule, Take 2 capsules (300 mg total) by mouth every 12 (twelve) hours., Disp: 120 capsule, Rfl: 6 .  vitamin B-12 (CYANOCOBALAMIN) 1000 MCG tablet, Take 1,000 mcg by mouth daily., Disp: , Rfl:  .  vitamin E 180 MG (400 UNITS) capsule, Take 400 Units by mouth daily., Disp: , Rfl:  .  gabapentin (NEURONTIN) 100 MG capsule, Take 1 capsule (100 mg total) by mouth 3 (three) times daily., Disp: 90 capsule, Rfl: 0   Allergies  Allergen Reactions  . Nucynta [Tapentadol  Hcl] Swelling  . Codeine Nausea Only and Other (See Comments)    Headache  . Sulfonamide Derivatives Nausea Only and Other (See Comments)    Headache  . Esomeprazole Magnesium Nausea Only  . Fluconazole Rash    Redness and blistering on left thigh   Past Medical History:  Diagnosis Date  . Anxiety   . Arthritis   . 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 Gastric ulcer 2011   EGD, 5/11  . History of peptic ulcer disease   . Hypothyroidism    not on meds, followed by Dr. Elyse Hsu  . Lipoma    left upper arm  . Osteoporosis   . Pericardial effusion    a. HCAP complicated by pericardial effusion requiring pericardial window 10/2016 and large L pleural  effusion requring VATS.  Marland Kitchen Pleural effusion    a. s/p VATS 2017.  Marland Kitchen Pneumonia 10/2016  . PONV (postoperative nausea and vomiting)   . Prolonged QT interval   . Urine incontinence   . UTI (lower urinary tract infection)    frequent  . Vitamin D deficiency     Past Surgical History:  Procedure Laterality Date  . BRAVO [place] STUDY  12/04/2012   Procedure: BRAVO [place];  Surgeon: [name], MD;  [facility];  Service: Endoscopy;  Laterality: N/A;  . CHOLECYSTECTOMY N/A 01/03/2014   Procedure: LAPAROSCOPIC CHOLECYSTECTOMY WITH INTRAOPERATIVE CHOLANGIOGRAM;  Surgeon: Joyice Faster. Cornett, MD;  Location: [place];  Service: General;  Laterality: N/A;  . COLONOSCOPY  01/2004   DR [facility], few small tics  . ESOPHAGOGASTRODUODENOSCOPY  02/2010   gastric ulcers  . ESOPHAGOGASTRODUODENOSCOPY  12/04/2012   HRC:BULAGTX gastritis (inflammation) was found in the gastric antrum; multiple biopsies The duodenal mucosa showed no abnormalities in the bulb and second portion of the duodenum  . JOINT REPLACEMENT Right 10/22/2019  . KYPHOPLASTY N/A 02/14/2019   Procedure: KYPHOPLASTY T12 and L1;  Surgeon: Melina Schools, MD;  Location: [place];  Service: Orthopedics;  Laterality: N/A;  120 mins   . LIPOMA EXCISION  08/02/2011   left shoulder  . NOSE SURGERY    . PARTIAL HYSTERECTOMY     vaginal at age 89 years of age  . TOE DEBRIDEMENT Left 1962   lt great toe  . TOTAL KNEE ARTHROPLASTY Right 10/22/2019   Procedure: TOTAL KNEE ARTHROPLASTY;  Surgeon: Gaynelle Arabian, MD;  Location: [facility];  Service: Orthopedics;  Laterality: Right;  26mn  . TOTAL KNEE ARTHROPLASTY Left 03/03/2020   Procedure: TOTAL KNEE ARTHROPLASTY;  Surgeon: AGaynelle Arabian MD;  Location: [facility];  Service: Orthopedics;  Laterality: Left;  565m  . VIDEO ASSISTED THORACOSCOPY (VATS)/EMPYEMA Left 10/25/2016   Procedure: VIDEO ASSISTED THORACOSCOPY (VATS), BRONCH,DRAINAGE OF PLEURAL EFFUSION,PERICARDIAL WINDOW WITH DRAINAGE OF PERICARDIAL FLUID, TEE;  Surgeon: StMelrose NakayamaMD;  [place] [facility] [place];  Laterality: Left;  . Marland KitchenIDEO BRONCHOSCOPY N/A 10/25/2016   Procedure: VIDEO BRONCHOSCOPY;  Surgeon: StMelrose NakayamaMD;  [place]: Thor[place];  Laterality: N/A;    Social History   Socioeconomic History  . Marital status: Married    Spouse name: Not on file  . Number of children: 2  . Years of education: 12th grade  . Highest education level: Not on file  Occupational History  . 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UENPLOYED    Employer: RETIRED  Tobacco Use  . Smoking status: Never Smoker  . Smokeless tobacco: Never Used  Vaping Use  . Vaping Use: Never used  Substance and Sexual Activity  . Alcohol use: No    Alcohol/week: 0.0 standard drinks  . Drug use: No  . Sexual activity: Yes    Partners: Male    Birth control/protection: None  Other Topics Concern  . Not on file  Social History Narrative  . Not on file   Social Determinants of Health   Financial Resource Strain: Not on file  Food Insecurity: Not on file  Transportation Needs: Not on file  Physical Activity: Not on file  Stress: Not on file  Social Connections: Not on file  Intimate Partner  Violence: Not on file        Objective:    BP 112/60   Pulse 84   Temp (!) 95.8  F (35.4 C) (Temporal)   Ht _0  (1.651 m)   Wt 205 lb 3.2 oz (93.1 kg)   SpO2 94%   BMI 34.15 kg/m   Wt Readings from Last 3 Encounters:  01/09/21 205 lb 3.2 oz (93.1 kg)  12/31/20 202 lb 9.6 oz (91.9 kg)  10/05/20 208 lb (94.3 kg)    Physical Exam Vitals reviewed.  Constitutional:      General: She is not in acute distress.    Appearance: Normal appearance. She is obese. She is not ill-appearing, toxic-appearing or diaphoretic.  HENT:     Head: Normocephalic and atraumatic.  Eyes:     General: No scleral icterus.       Right eye: No discharge.        Left eye: No discharge.     Conjunctiva/sclera: Conjunctivae normal.  Cardiovascular:     Rate and Rhythm: Normal rate and regular rhythm.     Heart sounds: Normal heart sounds. No murmur heard. No friction rub. No gallop.   Pulmonary:     Effort: Pulmonary effort is normal. No respiratory distress.     Breath sounds: Normal breath sounds. No stridor. No wheezing, rhonchi or rales.  Musculoskeletal:        General: Normal range of motion.     Cervical back: Normal range of motion.  Skin:    General: Skin is warm and dry.     Capillary Refill: Capillary refill takes less than 2 seconds.  Neurological:     General: No focal deficit present.     Mental Status: She is alert and oriented to person, place, and time. Mental status is at baseline.     Gait: Gait abnormal (ambulating with walker).  Psychiatric:        Mood and Affect: Mood normal.        Behavior: Behavior normal.        Thought Content: Thought content normal.        Judgment: Judgment normal.     Lab Results  Component Value Date   TSH 0.90 05/26/2020   Lab Results  Component Value Date   WBC 9.1 12/24/2020   HGB 10.9 (L) 12/24/2020   HCT 35.5 (L) 12/24/2020   MCV 88.5 12/24/2020   PLT 278 12/24/2020   Lab Results  Component Value Date   NA 138 12/24/2020    K 4.4 12/24/2020   CO2 25 12/24/2020   GLUCOSE 104 (H) 12/24/2020   BUN 18 12/24/2020   CREATININE 0.85 12/24/2020   BILITOT 0.8 12/24/2020   ALKPHOS 138 (H) 12/24/2020   AST 28 12/24/2020   ALT 15 12/24/2020   PROT 7.2 12/24/2020   ALBUMIN 4.0 12/24/2020   CALCIUM 9.1 12/24/2020   ANIONGAP 8 12/24/2020   No results found for: CHOL No results found for: HDL No results found for: LDLCALC No results found for: TRIG No results found for: [facility] Lab Results  Component Value Date   HGBA1C 5.3 04/24/2019

## 2021-01-11 LAB — BCR-ABL1, CML/ALL, PCR, QUANT: Interpretation (BCRAL):: NEGATIVE

## 2021-01-11 LAB — CYCLIC CITRUL PEPTIDE ANTIBODY, IGG/IGA: Cyclic Citrullin Peptide Ab: 3 units (ref 0–19)

## 2021-01-12 ENCOUNTER — Telehealth: Payer: Self-pay

## 2021-01-12 NOTE — Telephone Encounter (Signed)
Patient aware and verbalizes understanding. 

## 2021-01-12 NOTE — Telephone Encounter (Signed)
Pt called stating that she got a notification on her phone about some Rx's that our office called in for her. Explained to pt that her Lexapro and her Gabapentin Rx's were called in to pharmacy on 01/09/21. Pt is confused and says she doesn't know anything about a Rx for Gabapentin. Says she takes Hydrocodone for her back pain and Lexapro for her anxiety and Levothyroxine for her thyroid. No other meds are taken.  Please advise and call patient.

## 2021-01-12 NOTE — Telephone Encounter (Signed)
It appears this was prescribed when she was in the hospital in October last year. Since she is not taking it, I took it off her medication list. Please disregard the prescription at the pharmacy.

## 2021-01-12 NOTE — Telephone Encounter (Signed)
Oral Oncology Patient Advocate Encounter  Received notification from [employer] Patient Assistance program that patient has been successfully enrolled into their program to receive Tasigna from the manufacturer at $0 out of pocket until 12/12/21.    I called and spoke with patient.  She knows we will have to re-apply.   Specialty Pharmacy that will dispense medication is [facility].  Patient knows to call the office with questions or concerns.   Oral Oncology Clinic will continue to follow.  [place] Patient [place] Phone [phone] Fax [phone] 01/12/2021 9:09 AM

## 2021-01-12 NOTE — Telephone Encounter (Signed)
lmtcb

## 2021-01-12 NOTE — Telephone Encounter (Signed)
Patient was seen 1/28- please review and advise

## 2021-01-16 LAB — FANA STAINING PATTERNS
Speckled Pattern: 1:1280 {titer} — ABNORMAL HIGH
Spindle Apparatus Pattern: >1:1280 {titer} — AB

## 2021-01-16 LAB — ANA,IFA RA DIAG PNL W/RFLX TIT/PATN
ANA Titer 1: POSITIVE — AB
Cyclic Citrullin Peptide Ab: 3 units (ref 0–19)
Rheumatoid fact SerPl-aCnc: <10 IU/mL (ref <14.0)

## 2021-01-16 LAB — HLA-B27 ANTIGEN: HLA B27: NEGATIVE

## 2021-01-16 LAB — LIPID PANEL
Chol/HDL Ratio: 3.4 ratio (ref 0.0–4.4)
Cholesterol, Total: 206 mg/dL — ABNORMAL HIGH (ref 100–199)
HDL: 60 mg/dL (ref >39)
LDL Chol Calc (NIH): 125 mg/dL — ABNORMAL HIGH (ref 0–99)
Triglycerides: 116 mg/dL (ref 0–149)
VLDL Cholesterol Cal: 21 mg/dL (ref 5–40)

## 2021-01-16 LAB — URIC ACID: Uric Acid: 6.2 mg/dL (ref 3.1–7.9)

## 2021-01-16 LAB — VITAMIN D 25 HYDROXY (VIT D DEFICIENCY, FRACTURES): Vit D, 25-Hydroxy: 30.9 ng/mL (ref 30.0–100.0)

## 2021-01-16 LAB — C-REACTIVE PROTEIN: CRP: 35 mg/L — ABNORMAL HIGH (ref 0–10)

## 2021-01-16 LAB — SEDIMENTATION RATE: Sed Rate: 47 mm/hr — ABNORMAL HIGH (ref 0–40)

## 2021-01-16 LAB — HOMOCYSTEINE: Homocysteine: 11.8 umol/L (ref 0.0–19.2)

## 2021-01-16 LAB — VITAMIN B12: Vitamin B-12: 1498 pg/mL — ABNORMAL HIGH (ref 232–1245)

## 2021-01-19 ENCOUNTER — Ambulatory Visit: Payer: Self-pay | Admitting: Orthopedic Surgery

## 2021-01-19 DIAGNOSIS — M5416 Radiculopathy, lumbar region: Secondary | ICD-10-CM | POA: Diagnosis not present

## 2021-01-19 NOTE — H&P (Deleted)
  The note originally documented on this encounter has been moved the the encounter in which it belongs.  

## 2021-01-19 NOTE — H&P (Signed)
Subjective:   Andrea Martinez is a very pleasant 75 year old on with long-standing debilitating low back pain and radicular left leg pain; it has gotten significantly worse over the last 6 months. She is in a multitude of injections including SI joint, facet blocks, epidural steroid injection, and bilateral L5 selective nerve root blocks. She is also requiring narcotic pain medication. She states that she never got any significant relief in her quality-of-life has remained quite poor. Based on imaging studies and the patients response to various injections we believe her primary pain generator it the L5-S1 level. After discussing risks and benefits of surgery, the patient has elected to move forward with surgery. She is scheduled for TLIF L5- S1 [facility] With Dr. Rolena Infante.  Patient Active Problem List   Diagnosis Date Noted  . Atherosclerosis of aorta ([place]) 09/14/2020  . Anxiety 09/14/2020  . Chronic low back pain 09/14/2020  . Thoracic compression fracture ([place]) 02/14/2019  . Sweating abnormality 10/11/2017  . Iron deficiency anemia 02/16/2017  . Solitary pulmonary nodule 10/23/2016  . Bladder prolapse, female, acquired 11/21/2015  . IBS (irritable bowel syndrome) 11/21/2015  . Neutrophilic leukocytosis 0000000  . OA (osteoarthritis) of knee 11/21/2015  . CML (chronic myeloid leukemia) ([place]) 11/20/2015  . Dyspepsia 11/30/2012  . Hypothyroidism 02/11/2010   Past Medical History:  Diagnosis Date  . Anxiety   . Arthritis   . 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 Gastric ulcer 2011   EGD, 5/11  . History of peptic ulcer disease   . Hypothyroidism    not on meds, followed by Dr. Elyse Hsu  . Lipoma    left upper arm  . Osteoporosis   . Pericardial effusion    a. HCAP complicated by pericardial effusion requiring pericardial window 10/2016 and large L pleural effusion requring VATS.  Marland Kitchen Pleural effusion    a. s/p VATS 2017.  Marland Kitchen Pneumonia 10/2016  . PONV  (postoperative nausea and vomiting)   . Prolonged QT interval   . Urine incontinence   . UTI (lower urinary tract infection)    frequent  . Vitamin D deficiency     Past Surgical History:  Procedure Laterality Date  . BRAVO [place] STUDY  12/04/2012   Procedure: BRAVO [place];  Surgeon: [name], MD;  [facility];  Service: Endoscopy;  Laterality: N/A;  . CHOLECYSTECTOMY N/A 01/03/2014   Procedure: LAPAROSCOPIC CHOLECYSTECTOMY WITH INTRAOPERATIVE CHOLANGIOGRAM;  Surgeon: Joyice Faster. [place], MD;  Location: [place];  Service: General;  Laterality: N/A;  . COLONOSCOPY  01/2004   DR [facility], few small tics  . ESOPHAGOGASTRODUODENOSCOPY  02/2010   gastric ulcers  . ESOPHAGOGASTRODUODENOSCOPY  12/04/2012   [id number] gastritis (inflammation) was found in the gastric antrum; multiple biopsies The duodenal mucosa showed no abnormalities in the bulb and second portion of the duodenum  . JOINT REPLACEMENT Right 10/22/2019  . KYPHOPLASTY N/A 02/14/2019   Procedure: KYPHOPLASTY T12 and L1;  Surgeon: Melina Schools, MD;  Location: [place];  Service: Orthopedics;  Laterality: N/A;  120 mins  . LIPOMA EXCISION  08/02/2011   left shoulder  . NOSE SURGERY    . PARTIAL HYSTERECTOMY     vaginal at age 89 years of age  . TOE DEBRIDEMENT Left 1962   lt great toe  . TOTAL KNEE ARTHROPLASTY Right 10/22/2019   Procedure: TOTAL KNEE ARTHROPLASTY;  Surgeon: Gaynelle Arabian, MD;  Location: [facility];  Service: Orthopedics;  Laterality: Right;  63min  . TOTAL KNEE  ARTHROPLASTY Left 03/03/2020   Procedure: TOTAL KNEE ARTHROPLASTY;  Surgeon: Gaynelle Arabian, MD;  Location: [facility];  Service: Orthopedics;  Laterality: Left;  55min  . VIDEO ASSISTED THORACOSCOPY (VATS)/EMPYEMA Left 10/25/2016   Procedure: VIDEO ASSISTED THORACOSCOPY (VATS), BRONCH,DRAINAGE OF PLEURAL EFFUSION,PERICARDIAL WINDOW WITH DRAINAGE OF PERICARDIAL FLUID, TEE;  Surgeon: [name], MD;  Location: [place];  Service:  Thoracic;  Laterality: Left;  Marland Kitchen VIDEO BRONCHOSCOPY N/A 10/25/2016   Procedure: VIDEO BRONCHOSCOPY;  Surgeon: [name], MD;  Location: [facility];  Service: Thoracic;  Laterality: N/A;    Current Outpatient Medications  Medication Sig Dispense Refill Last Dose  . acetaminophen (TYLENOL) 500 MG tablet Take 1,000 mg by mouth every 6 (six) hours as needed for mild pain or moderate pain.      . cetirizine (ZYRTEC) 10 MG tablet Take 10 mg by mouth at bedtime.     . Cholecalciferol (VITAMIN D3 PO) Take by mouth.     . escitalopram (LEXAPRO) 20 MG tablet Take 1.5 tablets (30 mg total) by mouth daily. 135 tablet 1   . HYDROcodone-acetaminophen (NORCO) 10-325 MG tablet Take 1 tablet by mouth 3 (three) times daily as needed.     . Lactobacillus-Inulin (PROBIOTIC DIGESTIVE SUPPORT PO) Take 2 tablets by mouth at bedtime. Gummie     . levothyroxine (SYNTHROID) 125 MCG tablet Take 1 tablet (125 mcg total) by mouth daily before breakfast. 90 tablet 3   . Multiple Vitamin (MULTIVITAMIN) tablet Take 1 tablet by mouth daily.     . multivitamin-iron-minerals-folic acid (CENTRUM) chewable tablet Chew 1 tablet by mouth daily.     . nilotinib (TASIGNA) 150 MG capsule Take 2 capsules (300 mg total) by mouth every 12 (twelve) hours. 120 capsule 6   . vitamin B-12 (CYANOCOBALAMIN) 1000 MCG tablet Take 1,000 mcg by mouth daily.     . vitamin E 180 MG (400 UNITS) capsule Take 400 Units by mouth daily.      No current facility-administered medications for this visit.   Allergies  Allergen Reactions  . Nucynta [Tapentadol Hcl] Swelling  . Codeine Nausea Only and Other (See Comments)    Headache  . Sulfonamide Derivatives Nausea Only and Other (See Comments)    Headache  . Esomeprazole Magnesium Nausea Only  . Fluconazole Rash    Redness and blistering on left thigh    Social History   Tobacco Use  . Smoking status: Never Smoker  . Smokeless tobacco: Never Used  Substance Use Topics  . Alcohol use: No     Alcohol/week: 0.0 standard drinks    Family History  Problem Relation Age of Onset  . Lung cancer Father   . COPD Mother   . Stroke Mother   . Thyroid disease Mother   . Colon cancer Neg Hx   . Anesthesia problems Neg Hx   . Hypotension Neg Hx   . Malignant hyperthermia Neg Hx   . Pseudochol deficiency Neg Hx     Review of Systems Pertinent items are noted in HPI.  Objective:   Vitals: Ht: 5 ft 4 in 01/19/2021 10:24 am Wt: 198.5 lbs 01/19/2021 10:30 am BMI: 34.1 01/19/2021 10:30 am BP: 150/64 01/19/2021 10:35 am Pulse: 69 bpm 01/19/2021 10:35 am  General: Alert and oriented 3, no apparent distress  Ambulation: Antalgic with a rolling walker  Inspection: No obvious deformity  Heart: Regular rate and rhythm, no rubs, murmurs, or gallops  Lungs: Clear auscultation bilaterally  Abdomen: Bowel sounds 4, nondistended, nontender, no  rebound tenderness, no loss of bladder or bowel control  Ratasha reports significant temporary improvement for a few hours after the Marcaine L5 selective nerve root block. She states she is now having debilitating back buttock and neuropathic leg pain left worse than the right. She is ambulating with a walker and she does have generalized weakness into the lower extremity bilaterally. She has decreased sensation light touch in the L5 dermatome especially on the left side. Positive straight leg raise test on the left side. Lumbar MRI: completed on 12/16/20 was [facility] I have independently reviewed the images as well as the radiology report. Stable benign-appearing 8 mm chondroid lesion in the left posterior ilium. There is no S4 and S5 marrow edema with mild buckling of the anterior S4 cortex consistent with a new insufficiency fracture. No suspicious marrow signal changes. No new compression fracture in the lower thoracic or lumbar spine. No significant interval changes from her prior MRI of 05/30/20. 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 There is mild stenosis at the remainder of the lumbar levels.  Assessment:   Rabia is a pleasant 75 year old female who has chronic low back pain worsening over the last 6 months in addition to radicular left leg pain in L5 dermatome pattern and generalized lower extremity weakness. 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 Thus far, she is failed appropriate conservative care including over-the-counter medications, narcotic pain medications, and several injections. After discussing risks and benefits of surgery the patient has elected to move forward with surgical intervention.  Plan:   Transforaminal lumbar interbody fusion L5-S1  Risks and benefits of surgery were discussed with the patient. These include: Infection, bleeding, death, stroke, paralysis, ongoing or worse pain, need for additional surgery, nonunion, leak of spinal fluid, adjacent segment degeneration requiring additional fusion surgery, Injury to abdominal vessels that can require anterior surgery to stop bleeding. Malposition of the cage and/or pedicle screws that could require additional surgery. Loss of bowel and bladder control. Postoperative hematoma causing neurologic compression that could require urgent or emergent re-operation.  We have obtain preoperative clearance from the patient's primary care provider.  I reviewed the patient's medication list with her. She is not on any blood thinners. Not using any aspirin. Not taking any anti-inflammatory medications. I did advise her to hold off on any anti-inflammatory medications for 1 week prior to surgery as well as 6 weeks after surgery and she expressed understanding of this. She will continue taking her B12 but hold all of her other vitamins leadiing up to surgery. She may restart these 2  weeks after surgery.  We have also discussed the post-operative recovery period to include: bathing/showering restrictions, wound healing, activity (and driving) restrictions, medications/pain mangement.  We have also discussed post-operative redflags to include: signs and symptoms of postoperative infection, DVT/PE.  Patient is scheduled for LSO brace fitting with physical therapy following this appointment.  All patients questions were invited and answered.  Follow-up: 2 weeks postop

## 2021-01-20 ENCOUNTER — Encounter: Payer: Self-pay | Admitting: Family Medicine

## 2021-01-20 DIAGNOSIS — E782 Mixed hyperlipidemia: Secondary | ICD-10-CM | POA: Insufficient documentation

## 2021-01-23 NOTE — Progress Notes (Signed)
Surgical Instructions    Your procedure is scheduled on Wednesday, January 28, 2021 from 12:00PM - 5:20PM .  Report to [facility] Main Entrance "A" at 10:00 A.M., then check in with the Admitting office.  Call this number if you have problems the morning of surgery:  [phone]   If you have any questions prior to your surgery date call [phone]: Open Monday-Friday 8am-4pm    Remember:  Do not eat after midnight the night before your surgery  You may drink clear liquids until 9:00 AM the morning of your surgery.   Clear liquids allowed are: Water, Non-Citrus Juices (without pulp), Carbonated Beverages, Clear Tea, Black Coffee Only, and Gatorade    Take these medicines the morning of surgery with A SIP OF WATER:  escitalopram (LEXAPRO) levothyroxine (SYNTHROID) nilotinib (TASIGNA) - if it is due HYDROcodone-acetaminophen (NORCO) - if needed   As of today, STOP taking any Aspirin (unless otherwise instructed by your surgeon) Aleve, Naproxen, Ibuprofen, Motrin, Advil, Goody's, BC's, all herbal medications, fish oil, and all vitamins.                     Do not wear jewelry, make up, or nail polish            Do not wear lotions, powders, perfumes, or deodorant.            Do not shave 48 hours prior to surgery.            Do not bring valuables to the hospital.            [facility] is not responsible for any belongings or valuables.  Do NOT Smoke (Tobacco/Vaping) or drink Alcohol 24 hours prior to your procedure If you use a CPAP at night, you may bring all equipment for your overnight stay.   Contacts, glasses, dentures or bridgework may not be worn into surgery, please bring cases for these belongings   For patients admitted to the hospital, discharge time will be determined by your treatment team.   Patients discharged the day of surgery will not be allowed to drive home, and someone needs to stay with them for 24 hours.    Special instructions:   [place]-  Preparing For Surgery  Before surgery, you can play an important role. Because skin is not sterile, your skin needs to be as free of germs as possible. You can reduce the number of germs on your skin by washing with CHG (chlorahexidine gluconate) Soap before surgery.  CHG is an antiseptic cleaner which kills germs and bonds with the skin to continue killing germs even after washing.    Oral Hygiene is also important to reduce your risk of infection.  Remember - BRUSH YOUR TEETH THE MORNING OF SURGERY WITH YOUR REGULAR TOOTHPASTE  Please do not use if you have an allergy to CHG or antibacterial soaps. If your skin becomes reddened/irritated stop using the CHG.  Do not shave (including legs and underarms) for at least 48 hours prior to first CHG shower. It is OK to shave your face.  Please follow these instructions carefully.   1. Shower the NIGHT BEFORE SURGERY and the MORNING OF SURGERY  2. If you chose to wash your hair, wash your hair first as usual with your normal shampoo.  3. After you shampoo, rinse your hair and body thoroughly to remove the shampoo.  4. Wash Face and genitals (private parts) with your normal soap.   5.  Shower the NIGHT BEFORE SURGERY and the MORNING OF SURGERY with CHG Soap.   6. Use CHG Soap as you would any other liquid soap. You can apply CHG directly to the skin and wash gently with a scrungie or a clean washcloth.   7. Apply the CHG Soap to your body ONLY FROM THE NECK DOWN.  Do not use on open wounds or open sores. Avoid contact with your eyes, ears, mouth and genitals (private parts). Wash Face and genitals (private parts)  with your normal soap.   8. Wash thoroughly, paying special attention to the area where your surgery will be performed.  9. Thoroughly rinse your body with warm water from the neck down.  10. DO NOT shower/wash with your normal soap after using and rinsing off the CHG Soap.  11. Pat yourself dry with a CLEAN TOWEL.  12. Wear CLEAN  PAJAMAS to bed the night before surgery  13. Place CLEAN SHEETS on your bed the night before your surgery  14. DO NOT SLEEP WITH PETS.   Day of Surgery: Wear Clean/Comfortable clothing the morning of surgery Do not apply any deodorants/lotions.   Remember to brush your teeth WITH YOUR REGULAR TOOTHPASTE.   Please read over the following fact sheets that you were given.

## 2021-01-26 ENCOUNTER — Encounter (HOSPITAL_COMMUNITY): Payer: Self-pay

## 2021-01-26 ENCOUNTER — Ambulatory Visit (HOSPITAL_COMMUNITY)
Admission: RE | Admit: 2021-01-26 | Discharge: 2021-01-26 | Disposition: A | Discharge status: Home / Self Care | Payer: Medicare Other | Source: Ambulatory Visit | Attending: Orthopedic Surgery | Admitting: Orthopedic Surgery

## 2021-01-26 ENCOUNTER — Other Ambulatory Visit (HOSPITAL_COMMUNITY)
Admission: RE | Admit: 2021-01-26 | Discharge: 2021-01-26 | Disposition: A | Discharge status: Home / Self Care | Payer: Medicare Other | Source: Ambulatory Visit | Attending: Orthopedic Surgery | Admitting: Orthopedic Surgery

## 2021-01-26 ENCOUNTER — Other Ambulatory Visit: Payer: Self-pay

## 2021-01-26 ENCOUNTER — Encounter (HOSPITAL_COMMUNITY)
Admission: RE | Admit: 2021-01-26 | Discharge: 2021-01-26 | Disposition: A | Discharge status: Home / Self Care | Payer: Medicare Other | Source: Ambulatory Visit | Attending: Orthopedic Surgery | Admitting: Orthopedic Surgery

## 2021-01-26 DIAGNOSIS — Z20822 Contact with and (suspected) exposure to covid-19: Secondary | ICD-10-CM | POA: Insufficient documentation

## 2021-01-26 DIAGNOSIS — E782 Mixed hyperlipidemia: Secondary | ICD-10-CM | POA: Diagnosis not present

## 2021-01-26 DIAGNOSIS — C9210 Chronic myeloid leukemia, BCR/ABL-positive, not having achieved remission: Secondary | ICD-10-CM | POA: Diagnosis not present

## 2021-01-26 DIAGNOSIS — Z888 Allergy status to other drugs, medicaments and biological substances status: Secondary | ICD-10-CM | POA: Diagnosis not present

## 2021-01-26 DIAGNOSIS — E039 Hypothyroidism, unspecified: Secondary | ICD-10-CM | POA: Diagnosis not present

## 2021-01-26 DIAGNOSIS — M810 Age-related osteoporosis without current pathological fracture: Secondary | ICD-10-CM | POA: Diagnosis not present

## 2021-01-26 DIAGNOSIS — Z79890 Hormone replacement therapy (postmenopausal): Secondary | ICD-10-CM | POA: Diagnosis not present

## 2021-01-26 DIAGNOSIS — Z96653 Presence of artificial knee joint, bilateral: Secondary | ICD-10-CM | POA: Diagnosis not present

## 2021-01-26 DIAGNOSIS — Z4889 Encounter for other specified surgical aftercare: Secondary | ICD-10-CM | POA: Diagnosis not present

## 2021-01-26 DIAGNOSIS — Z882 Allergy status to sulfonamides status: Secondary | ICD-10-CM | POA: Diagnosis not present

## 2021-01-26 DIAGNOSIS — Z01812 Encounter for preprocedural laboratory examination: Secondary | ICD-10-CM | POA: Insufficient documentation

## 2021-01-26 DIAGNOSIS — Z8711 Personal history of peptic ulcer disease: Secondary | ICD-10-CM | POA: Diagnosis not present

## 2021-01-26 DIAGNOSIS — M9983 Other biomechanical lesions of lumbar region: Secondary | ICD-10-CM | POA: Diagnosis not present

## 2021-01-26 DIAGNOSIS — Z01818 Encounter for other preprocedural examination: Secondary | ICD-10-CM

## 2021-01-26 DIAGNOSIS — M5116 Intervertebral disc disorders with radiculopathy, lumbar region: Secondary | ICD-10-CM | POA: Diagnosis not present

## 2021-01-26 DIAGNOSIS — M5117 Intervertebral disc disorders with radiculopathy, lumbosacral region: Secondary | ICD-10-CM | POA: Diagnosis not present

## 2021-01-26 DIAGNOSIS — M4807 Spinal stenosis, lumbosacral region: Secondary | ICD-10-CM | POA: Diagnosis not present

## 2021-01-26 DIAGNOSIS — M5136 Other intervertebral disc degeneration, lumbar region: Secondary | ICD-10-CM | POA: Diagnosis not present

## 2021-01-26 DIAGNOSIS — D509 Iron deficiency anemia, unspecified: Secondary | ICD-10-CM | POA: Diagnosis not present

## 2021-01-26 DIAGNOSIS — Z981 Arthrodesis status: Secondary | ICD-10-CM | POA: Diagnosis not present

## 2021-01-26 DIAGNOSIS — Z79899 Other long term (current) drug therapy: Secondary | ICD-10-CM | POA: Diagnosis not present

## 2021-01-26 DIAGNOSIS — Z885 Allergy status to narcotic agent status: Secondary | ICD-10-CM | POA: Diagnosis not present

## 2021-01-26 DIAGNOSIS — F419 Anxiety disorder, unspecified: Secondary | ICD-10-CM | POA: Diagnosis present

## 2021-01-26 LAB — TYPE AND SCREEN
ABO/RH(D): A POS
Antibody Screen: NEGATIVE

## 2021-01-26 LAB — SURGICAL PCR SCREEN
MRSA, PCR: NEGATIVE
Staphylococcus aureus: NEGATIVE

## 2021-01-26 LAB — BASIC METABOLIC PANEL
Anion gap: 10 (ref 5–15)
BUN: 18 mg/dL (ref 8–23)
CO2: 23 mmol/L (ref 22–32)
Calcium: 9.2 mg/dL (ref 8.9–10.3)
Chloride: 107 mmol/L (ref 98–111)
Creatinine, Ser: 0.93 mg/dL (ref 0.44–1.00)
GFR, Estimated: >60 mL/min (ref >60)
Glucose, Bld: 104 mg/dL — ABNORMAL HIGH (ref 70–99)
Potassium: 4.3 mmol/L (ref 3.5–5.1)
Sodium: 140 mmol/L (ref 135–145)

## 2021-01-26 LAB — PROTIME-INR
INR: 1 (ref 0.8–1.2)
Prothrombin Time: 13 seconds (ref 11.4–15.2)

## 2021-01-26 LAB — CBC
HCT: 38.7 % (ref 36.0–46.0)
Hemoglobin: 11.6 g/dL — ABNORMAL LOW (ref 12.0–15.0)
MCH: 26.6 pg (ref 26.0–34.0)
MCHC: 30 g/dL (ref 30.0–36.0)
MCV: 88.8 fL (ref 80.0–100.0)
Platelets: 305 10*3/uL (ref 150–400)
RBC: 4.36 MIL/uL (ref 3.87–5.11)
RDW: 13.6 % (ref 11.5–15.5)
WBC: 10.3 10*3/uL (ref 4.0–10.5)
nRBC: 0 % (ref 0.0–0.2)

## 2021-01-26 LAB — URINALYSIS, ROUTINE W REFLEX MICROSCOPIC
Bilirubin Urine: NEGATIVE
Glucose, UA: NEGATIVE mg/dL
Hgb urine dipstick: NEGATIVE
Ketones, ur: NEGATIVE mg/dL
Nitrite: NEGATIVE
Protein, ur: NEGATIVE mg/dL
Specific Gravity, Urine: 1.009 (ref 1.005–1.030)
pH: 5 (ref 5.0–8.0)

## 2021-01-26 LAB — APTT: aPTT: 28 seconds (ref 24–36)

## 2021-01-26 LAB — SARS CORONAVIRUS 2 (TAT 6-24 HRS): SARS Coronavirus 2: NEGATIVE

## 2021-01-26 NOTE — Progress Notes (Signed)
PCP - Hendricks Limes FNP @ [facility] Cardiologist - na Oncologist: Delton Coombes Endocrinologist: Nida  Chest x-ray - 01/26/21 EKG - 10/21 Stress Test - na ECHO - 07/14/15, pt. Stated she was not aware of echo ordered in march 2022.Stated her oncologist said she didn't need it. Stated she didn't know who a [place] strader PA was who ordered the test. Cardiac Cath - na  Sleep Study - na   Blood Thinner Instructions:  na Aspirin Instructions:  na    COVID TEST- 01/26/21   Anesthesia review:   Patient denies shortness of breath, fever, cough and chest pain at PAT appointment   All instructions explained to the patient, with a verbal understanding of the material. Patient agrees to go over the instructions while at home for a better understanding. Patient also instructed to self quarantine after being tested for COVID-19. The opportunity to ask questions was provided.

## 2021-01-27 NOTE — Progress Notes (Signed)
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  She is managed chronically on [place].  Last seen by Dr. Delton Coombes 08/28/2020, tolerating therapy well.  History of left pleural effusion and pericardial effusion in the setting of HCAP requiring pericardial window 10/25/2016.  She continues to follow annually with cardiology.  Last seen 02/27/2020 and doing well from a cardiac standpoint at that time.  At that visit she was cleared to undergo knee replacement and was felt to be low risk from a cardiac standpoint.  She did undergo left TKR 7/34/2876 without complication.  Cleared by PCP Delight Ovens, [place] 01/01/2021.  Preop labs reviewed, unremarkable.  EKG 10/05/2020: Sinus rhythm.  Rate 87. Short PR interval. Probable left atrial enlargement. Nonspecific intraventricular conduction delay. Borderline repolarization abnormality. since last tracing no significant change  CHEST - 2 VIEW 01/26/21:  COMPARISON:  February 14, 2019 chest radiograph and chest CT June 14, 2019  FINDINGS: There is mild scarring in the left mid lung and right base regions. There is a questionable nipple shadow on the left. No edema or airspace opacity. Heart size and pulmonary vascularity are normal. No adenopathy. Patient is undergone kyphoplasty procedures in the lower thoracic and upper lumbar regions.  IMPRESSION: Questionable nipple shadow on the left. Advise repeat study with nipple markers to confirm. Areas of mild scarring bilaterally. No edema or airspace opacity. Heart upper normal in size. No adenopathy evident.   Wynonia Musty [facility] Short Stay Center/Anesthesiology Phone [phone] 01/27/2021 9:43 AM

## 2021-01-27 NOTE — Anesthesia Preprocedure Evaluation (Addendum)
Anesthesia Evaluation  Patient identified by MRN, date of birth, ID band Patient awake    Reviewed: Allergy & Precautions, NPO status , Patient's Chart, lab work & pertinent test results  History of Anesthesia Complications (+) PONV and history of anesthetic complications  Airway Mallampati: II  TM Distance: >3 FB Neck ROM: Full    Dental  (+) Implants, Dental Advisory Given   Pulmonary pneumonia, resolved,    Pulmonary exam normal        Cardiovascular Normal cardiovascular exam     Neuro/Psych PSYCHIATRIC DISORDERS Anxiety    GI/Hepatic   Endo/Other  Hypothyroidism   Renal/GU      Musculoskeletal  (+) Arthritis ,   Abdominal   Peds  Hematology   Anesthesia Other Findings   Reproductive/Obstetrics                             Anesthesia Physical Anesthesia Plan  ASA: II  Anesthesia Plan: General   Post-op Pain Management:    Induction: Intravenous  PONV Risk Score and Plan: Ondansetron, Dexamethasone, Propofol infusion and Treatment may vary due to age or medical condition  Airway Management Planned: Oral ETT  Additional Equipment: None  Intra-op Plan:   Post-operative Plan: Extubation in OR  Informed Consent: I have reviewed the patients History and Physical, chart, labs and discussed the procedure including the risks, benefits and alternatives for the proposed anesthesia with the patient or authorized representative who has indicated his/her understanding and acceptance.     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  She is managed chronically on [place].  Last seen by Dr. Delton Coombes 08/28/2020, tolerating therapy well.  History of left pleural effusion and pericardial effusion in the setting of HCAP requiring pericardial  window 10/25/2016.  She continues to follow annually with cardiology.  Last seen 02/27/2020 and doing well from a cardiac standpoint at that time.  At that visit she was cleared to undergo knee replacement and was felt to be low risk from a cardiac standpoint.  She did undergo left TKR 07/21/9832 without complication.  Cleared by PCP Delight Ovens, [place] 01/01/2021.  Preop labs reviewed, unremarkable.  EKG 10/05/2020: Sinus rhythm.  Rate 87. Short PR interval. Probable left atrial enlargement. Nonspecific intraventricular conduction delay. Borderline repolarization abnormality. since last tracing no significant change  CHEST - 2 VIEW 01/26/21:  COMPARISON:  February 14, 2019 chest radiograph and chest CT June 14, 2019  FINDINGS: There is mild scarring in the left mid lung and right base regions. There is a questionable nipple shadow on the left. No edema or airspace opacity. Heart size and pulmonary vascularity are normal. No adenopathy. Patient is undergone kyphoplasty procedures in the lower thoracic and upper lumbar regions.  IMPRESSION: Questionable nipple shadow on the left. Advise repeat study with nipple markers to confirm. Areas of mild scarring bilaterally. No edema or airspace opacity. Heart upper normal in size. No adenopathy evident. )       Anesthesia Quick Evaluation

## 2021-01-28 ENCOUNTER — Inpatient Hospital Stay (HOSPITAL_COMMUNITY)
Admission: RE | Admit: 2021-01-28 | Discharge: 2021-01-31 | DRG: 454 | Disposition: A | Discharge status: Home Health | Payer: Medicare Other | Attending: Orthopedic Surgery | Admitting: Orthopedic Surgery

## 2021-01-28 ENCOUNTER — Inpatient Hospital Stay (HOSPITAL_COMMUNITY): Payer: Medicare Other | Admitting: Anesthesiology

## 2021-01-28 ENCOUNTER — Encounter (HOSPITAL_COMMUNITY): Payer: Self-pay | Admitting: Orthopedic Surgery

## 2021-01-28 ENCOUNTER — Inpatient Hospital Stay (HOSPITAL_COMMUNITY): Payer: Medicare Other

## 2021-01-28 ENCOUNTER — Inpatient Hospital Stay (HOSPITAL_COMMUNITY): Payer: Medicare Other | Admitting: Vascular Surgery

## 2021-01-28 ENCOUNTER — Encounter (HOSPITAL_COMMUNITY)
Admission: RE | Disposition: A | Discharge status: Home / Self Care | Payer: Self-pay | Source: Home / Self Care | Attending: Orthopedic Surgery

## 2021-01-28 ENCOUNTER — Other Ambulatory Visit: Payer: Self-pay

## 2021-01-28 DIAGNOSIS — Z888 Allergy status to other drugs, medicaments and biological substances status: Secondary | ICD-10-CM

## 2021-01-28 DIAGNOSIS — Z981 Arthrodesis status: Secondary | ICD-10-CM

## 2021-01-28 DIAGNOSIS — M5117 Intervertebral disc disorders with radiculopathy, lumbosacral region: Secondary | ICD-10-CM | POA: Diagnosis not present

## 2021-01-28 DIAGNOSIS — E039 Hypothyroidism, unspecified: Secondary | ICD-10-CM | POA: Diagnosis present

## 2021-01-28 DIAGNOSIS — Z79899 Other long term (current) drug therapy: Secondary | ICD-10-CM

## 2021-01-28 DIAGNOSIS — M4807 Spinal stenosis, lumbosacral region: Secondary | ICD-10-CM | POA: Diagnosis present

## 2021-01-28 DIAGNOSIS — F419 Anxiety disorder, unspecified: Secondary | ICD-10-CM | POA: Diagnosis present

## 2021-01-28 DIAGNOSIS — E782 Mixed hyperlipidemia: Secondary | ICD-10-CM | POA: Diagnosis present

## 2021-01-28 DIAGNOSIS — Z419 Encounter for procedure for purposes other than remedying health state, unspecified: Secondary | ICD-10-CM

## 2021-01-28 DIAGNOSIS — M5136 Other intervertebral disc degeneration, lumbar region: Secondary | ICD-10-CM | POA: Diagnosis not present

## 2021-01-28 DIAGNOSIS — Z79890 Hormone replacement therapy (postmenopausal): Secondary | ICD-10-CM

## 2021-01-28 DIAGNOSIS — Z882 Allergy status to sulfonamides status: Secondary | ICD-10-CM | POA: Diagnosis not present

## 2021-01-28 DIAGNOSIS — Z96653 Presence of artificial knee joint, bilateral: Secondary | ICD-10-CM | POA: Diagnosis not present

## 2021-01-28 DIAGNOSIS — Z8711 Personal history of peptic ulcer disease: Secondary | ICD-10-CM

## 2021-01-28 DIAGNOSIS — Z885 Allergy status to narcotic agent status: Secondary | ICD-10-CM | POA: Diagnosis not present

## 2021-01-28 DIAGNOSIS — C9210 Chronic myeloid leukemia, BCR/ABL-positive, not having achieved remission: Secondary | ICD-10-CM | POA: Diagnosis not present

## 2021-01-28 DIAGNOSIS — M810 Age-related osteoporosis without current pathological fracture: Secondary | ICD-10-CM | POA: Diagnosis present

## 2021-01-28 DIAGNOSIS — Z20822 Contact with and (suspected) exposure to covid-19: Secondary | ICD-10-CM | POA: Diagnosis not present

## 2021-01-28 HISTORY — PX: TRANSFORAMINAL LUMBAR INTERBODY FUSION (TLIF) WITH PEDICLE SCREW FIXATION 1 LEVEL: SHX6141

## 2021-01-28 SURGERY — TRANSFORAMINAL LUMBAR INTERBODY FUSION (TLIF) WITH PEDICLE SCREW FIXATION 1 LEVEL
Anesthesia: General | Site: Spine Lumbar

## 2021-01-28 MED ORDER — PROPOFOL 10 MG/ML IV BOLUS
INTRAVENOUS | Status: DC | PRN
  Administered 2021-01-28: 30 mg via INTRAVENOUS
  Administered 2021-01-28: 170 mg via INTRAVENOUS

## 2021-01-28 MED ORDER — 0.9 % SODIUM CHLORIDE (POUR BTL) OPTIME
TOPICAL | Status: DC | PRN
  Administered 2021-01-28: 1000 mL

## 2021-01-28 MED ORDER — KETAMINE HCL 50 MG/5ML IJ SOSY
10.0000 mg | PREFILLED_SYRINGE | Freq: Once | INTRAMUSCULAR | Status: AC
  Administered 2021-01-28: 10 mg via INTRAVENOUS

## 2021-01-28 MED ORDER — ACETAMINOPHEN 10 MG/ML IV SOLN
INTRAVENOUS | Status: DC | PRN
  Administered 2021-01-28: 1000 mg via INTRAVENOUS

## 2021-01-28 MED ORDER — LACTATED RINGERS IV SOLN: INTRAVENOUS | Status: DC

## 2021-01-28 MED ORDER — FENTANYL CITRATE (PF) 250 MCG/5ML IJ SOLN
INTRAMUSCULAR | Status: AC
  Filled 2021-01-28: qty 5

## 2021-01-28 MED ORDER — SODIUM CHLORIDE 0.9% FLUSH: 3.0000 mL | INTRAVENOUS | Status: DC | PRN

## 2021-01-28 MED ORDER — DOCUSATE SODIUM 100 MG PO CAPS
100.0000 mg | ORAL_CAPSULE | Freq: Two times a day (BID) | ORAL | Status: DC
  Administered 2021-01-28 – 2021-01-30 (×5): 100 mg via ORAL
  Filled 2021-01-28 (×5): qty 1

## 2021-01-28 MED ORDER — ONDANSETRON HCL 4 MG PO TABS: 4.0000 mg | ORAL_TABLET | Freq: Four times a day (QID) | ORAL | Status: DC | PRN

## 2021-01-28 MED ORDER — HYDROMORPHONE HCL 1 MG/ML IJ SOLN
INTRAMUSCULAR | Status: AC
  Filled 2021-01-28: qty 1

## 2021-01-28 MED ORDER — FLEET ENEMA 7-19 GM/118ML RE ENEM: 1.0000 | ENEMA | Freq: Once | RECTAL | Status: DC | PRN

## 2021-01-28 MED ORDER — MENTHOL 3 MG MT LOZG: 1.0000 | LOZENGE | OROMUCOSAL | Status: DC | PRN

## 2021-01-28 MED ORDER — ACETAMINOPHEN 10 MG/ML IV SOLN
INTRAVENOUS | Status: AC
  Filled 2021-01-28: qty 100

## 2021-01-28 MED ORDER — HYDROMORPHONE HCL 1 MG/ML IJ SOLN: 0.2500 mg | INTRAMUSCULAR | Status: DC | PRN

## 2021-01-28 MED ORDER — ACETAMINOPHEN 650 MG RE SUPP: 650.0000 mg | RECTAL | Status: DC | PRN

## 2021-01-28 MED ORDER — HYDROMORPHONE HCL 1 MG/ML IJ SOLN
0.2500 mg | INTRAMUSCULAR | Status: DC | PRN
  Administered 2021-01-28 (×4): 0.5 mg via INTRAVENOUS

## 2021-01-28 MED ORDER — CHLORHEXIDINE GLUCONATE 0.12 % MT SOLN
15.0000 mL | Freq: Once | OROMUCOSAL | Status: AC
  Administered 2021-01-28: 15 mL via OROMUCOSAL
  Filled 2021-01-28: qty 15

## 2021-01-28 MED ORDER — EPINEPHRINE PF 1 MG/ML IJ SOLN
INTRAMUSCULAR | Status: AC
  Filled 2021-01-28: qty 1

## 2021-01-28 MED ORDER — OXYCODONE-ACETAMINOPHEN 10-325 MG PO TABS: 1.0000 | ORAL_TABLET | Freq: Four times a day (QID) | ORAL | 0 refills | Status: AC | PRN

## 2021-01-28 MED ORDER — CEFAZOLIN SODIUM-DEXTROSE 1-4 GM/50ML-% IV SOLN
1.0000 g | Freq: Three times a day (TID) | INTRAVENOUS | Status: AC
  Administered 2021-01-28 – 2021-01-29 (×2): 1 g via INTRAVENOUS
  Filled 2021-01-28 (×2): qty 50

## 2021-01-28 MED ORDER — HEMOSTATIC AGENTS (NO CHARGE) OPTIME
TOPICAL | Status: DC | PRN
  Administered 2021-01-28 (×2): 1 via TOPICAL

## 2021-01-28 MED ORDER — CEFAZOLIN SODIUM-DEXTROSE 2-4 GM/100ML-% IV SOLN
2.0000 g | INTRAVENOUS | Status: AC
  Administered 2021-01-28: 2 g via INTRAVENOUS

## 2021-01-28 MED ORDER — OXYCODONE HCL 5 MG PO TABS
5.0000 mg | ORAL_TABLET | ORAL | Status: DC | PRN
  Administered 2021-01-28 – 2021-01-30 (×2): 5 mg via ORAL
  Filled 2021-01-28 (×2): qty 1

## 2021-01-28 MED ORDER — BUPIVACAINE LIPOSOME 1.3 % IJ SUSP
20.0000 mL | INTRAMUSCULAR | Status: AC
  Administered 2021-01-28: 20 mL
  Filled 2021-01-28: qty 20

## 2021-01-28 MED ORDER — ESCITALOPRAM OXALATE 10 MG PO TABS
30.0000 mg | ORAL_TABLET | Freq: Every day | ORAL | Status: DC
  Administered 2021-01-29 – 2021-01-30 (×2): 30 mg via ORAL
  Filled 2021-01-28 (×4): qty 1

## 2021-01-28 MED ORDER — METHOCARBAMOL 1000 MG/10ML IJ SOLN
500.0000 mg | Freq: Four times a day (QID) | INTRAVENOUS | Status: DC | PRN
  Filled 2021-01-28: qty 5

## 2021-01-28 MED ORDER — ONDANSETRON HCL 4 MG/2ML IJ SOLN
INTRAMUSCULAR | Status: AC
  Filled 2021-01-28: qty 2

## 2021-01-28 MED ORDER — TRANEXAMIC ACID-NACL 1000-0.7 MG/100ML-% IV SOLN
1000.0000 mg | INTRAVENOUS | Status: AC
  Administered 2021-01-28: 1000 mg via INTRAVENOUS
  Filled 2021-01-28: qty 100

## 2021-01-28 MED ORDER — KETAMINE HCL 50 MG/5ML IJ SOSY
PREFILLED_SYRINGE | INTRAMUSCULAR | Status: AC
  Filled 2021-01-28: qty 5

## 2021-01-28 MED ORDER — METHOCARBAMOL 500 MG PO TABS
500.0000 mg | ORAL_TABLET | Freq: Three times a day (TID) | ORAL | 0 refills | Status: AC | PRN
Start: 2021-01-28 — End: 2021-02-02

## 2021-01-28 MED ORDER — ONDANSETRON HCL 4 MG PO TABS: 4.0000 mg | ORAL_TABLET | Freq: Three times a day (TID) | ORAL | 0 refills | Status: DC | PRN

## 2021-01-28 MED ORDER — CEFAZOLIN SODIUM-DEXTROSE 2-4 GM/100ML-% IV SOLN
INTRAVENOUS | Status: AC
  Filled 2021-01-28: qty 100

## 2021-01-28 MED ORDER — OXYCODONE HCL 5 MG PO TABS: 5.0000 mg | ORAL_TABLET | Freq: Once | ORAL | Status: DC | PRN

## 2021-01-28 MED ORDER — PROPOFOL 500 MG/50ML IV EMUL
INTRAVENOUS | Status: DC | PRN
  Administered 2021-01-28: 75 ug/kg/min via INTRAVENOUS

## 2021-01-28 MED ORDER — PROMETHAZINE HCL 25 MG/ML IJ SOLN: 6.2500 mg | INTRAMUSCULAR | Status: DC | PRN

## 2021-01-28 MED ORDER — THROMBIN 20000 UNITS EX SOLR
CUTANEOUS | Status: DC | PRN
  Administered 2021-01-28: 20 mL via TOPICAL

## 2021-01-28 MED ORDER — THROMBIN 20000 UNITS EX SOLR
CUTANEOUS | Status: AC
  Filled 2021-01-28: qty 20000

## 2021-01-28 MED ORDER — LEVOTHYROXINE SODIUM 125 MCG PO TABS
125.0000 ug | ORAL_TABLET | Freq: Every day | ORAL | Status: DC
  Administered 2021-01-29 – 2021-01-31 (×3): 125 ug via ORAL
  Filled 2021-01-28 (×3): qty 1

## 2021-01-28 MED ORDER — ONDANSETRON HCL 4 MG/2ML IJ SOLN: 4.0000 mg | Freq: Four times a day (QID) | INTRAMUSCULAR | Status: DC | PRN

## 2021-01-28 MED ORDER — GABAPENTIN 300 MG PO CAPS
300.0000 mg | ORAL_CAPSULE | Freq: Three times a day (TID) | ORAL | Status: DC
  Administered 2021-01-28 – 2021-01-30 (×5): 300 mg via ORAL
  Filled 2021-01-28 (×5): qty 1

## 2021-01-28 MED ORDER — METHOCARBAMOL 500 MG PO TABS
500.0000 mg | ORAL_TABLET | Freq: Four times a day (QID) | ORAL | Status: DC | PRN
  Administered 2021-01-28 – 2021-01-31 (×8): 500 mg via ORAL
  Filled 2021-01-28 (×7): qty 1

## 2021-01-28 MED ORDER — POLYETHYLENE GLYCOL 3350 17 G PO PACK: 17.0000 g | PACK | Freq: Every day | ORAL | Status: DC | PRN

## 2021-01-28 MED ORDER — DEXAMETHASONE SODIUM PHOSPHATE 10 MG/ML IJ SOLN
INTRAMUSCULAR | Status: AC
  Filled 2021-01-28: qty 1

## 2021-01-28 MED ORDER — LIDOCAINE 2% (20 MG/ML) 5 ML SYRINGE
INTRAMUSCULAR | Status: AC
  Filled 2021-01-28: qty 5

## 2021-01-28 MED ORDER — FENTANYL CITRATE (PF) 100 MCG/2ML IJ SOLN
25.0000 ug | INTRAMUSCULAR | Status: DC | PRN
  Administered 2021-01-28 (×2): 50 ug via INTRAVENOUS

## 2021-01-28 MED ORDER — ONDANSETRON HCL 4 MG/2ML IJ SOLN
INTRAMUSCULAR | Status: DC | PRN
  Administered 2021-01-28: 4 mg via INTRAVENOUS

## 2021-01-28 MED ORDER — SODIUM CHLORIDE 0.9% FLUSH
3.0000 mL | Freq: Two times a day (BID) | INTRAVENOUS | Status: DC
  Administered 2021-01-28 – 2021-01-29 (×3): 3 mL via INTRAVENOUS

## 2021-01-28 MED ORDER — ORAL CARE MOUTH RINSE: 15.0000 mL | Freq: Once | OROMUCOSAL | Status: AC

## 2021-01-28 MED ORDER — BUPIVACAINE-EPINEPHRINE 0.25% -1:200000 IJ SOLN
INTRAMUSCULAR | Status: DC | PRN
  Administered 2021-01-28: 20 mL
  Administered 2021-01-28: 10 mL

## 2021-01-28 MED ORDER — OXYCODONE HCL 5 MG PO TABS
10.0000 mg | ORAL_TABLET | ORAL | Status: DC | PRN
  Administered 2021-01-29 – 2021-01-31 (×10): 10 mg via ORAL
  Filled 2021-01-28 (×9): qty 2

## 2021-01-28 MED ORDER — ACETAMINOPHEN 325 MG PO TABS
650.0000 mg | ORAL_TABLET | ORAL | Status: DC | PRN
  Administered 2021-01-29 – 2021-01-30 (×4): 650 mg via ORAL
  Filled 2021-01-28 (×4): qty 2

## 2021-01-28 MED ORDER — DEXAMETHASONE SODIUM PHOSPHATE 4 MG/ML IJ SOLN
INTRAMUSCULAR | Status: DC | PRN
  Administered 2021-01-28: 4 mg via INTRAVENOUS

## 2021-01-28 MED ORDER — LIDOCAINE 2% (20 MG/ML) 5 ML SYRINGE
INTRAMUSCULAR | Status: DC | PRN
  Administered 2021-01-28: 100 mg via INTRAVENOUS

## 2021-01-28 MED ORDER — BUPIVACAINE HCL (PF) 0.25 % IJ SOLN
INTRAMUSCULAR | Status: AC
  Filled 2021-01-28: qty 30

## 2021-01-28 MED ORDER — FENTANYL CITRATE (PF) 100 MCG/2ML IJ SOLN
INTRAMUSCULAR | Status: AC
  Filled 2021-01-28: qty 2

## 2021-01-28 MED ORDER — PHENYLEPHRINE HCL-NACL 10-0.9 MG/250ML-% IV SOLN
INTRAVENOUS | Status: DC | PRN
  Administered 2021-01-28: 25 ug/min via INTRAVENOUS

## 2021-01-28 MED ORDER — SUCCINYLCHOLINE CHLORIDE 200 MG/10ML IV SOSY
PREFILLED_SYRINGE | INTRAVENOUS | Status: AC
  Filled 2021-01-28: qty 10

## 2021-01-28 MED ORDER — MIDAZOLAM HCL 2 MG/2ML IJ SOLN
1.0000 mg | Freq: Once | INTRAMUSCULAR | Status: AC
  Administered 2021-01-28: 1 mg via INTRAVENOUS

## 2021-01-28 MED ORDER — SUCCINYLCHOLINE CHLORIDE 200 MG/10ML IV SOSY
PREFILLED_SYRINGE | INTRAVENOUS | Status: DC | PRN
  Administered 2021-01-28: 120 mg via INTRAVENOUS

## 2021-01-28 MED ORDER — NILOTINIB HCL 150 MG PO CAPS
300.0000 mg | ORAL_CAPSULE | Freq: Two times a day (BID) | ORAL | Status: DC
  Administered 2021-01-28 – 2021-01-30 (×5): 300 mg via ORAL
  Filled 2021-01-28 (×2): qty 2

## 2021-01-28 MED ORDER — SURGIFOAM 100 EX MISC
CUTANEOUS | Status: DC | PRN
  Administered 2021-01-28: 1 via TOPICAL

## 2021-01-28 MED ORDER — MIDAZOLAM HCL 2 MG/2ML IJ SOLN
INTRAMUSCULAR | Status: AC
  Filled 2021-01-28: qty 2

## 2021-01-28 MED ORDER — OXYCODONE HCL 5 MG/5ML PO SOLN: 5.0000 mg | Freq: Once | ORAL | Status: DC | PRN

## 2021-01-28 MED ORDER — SODIUM CHLORIDE 0.9 % IV SOLN
250.0000 mL | INTRAVENOUS | Status: DC
  Administered 2021-01-28: 250 mL via INTRAVENOUS

## 2021-01-28 MED ORDER — PHENOL 1.4 % MT LIQD: 1.0000 | OROMUCOSAL | Status: DC | PRN

## 2021-01-28 MED ORDER — FENTANYL CITRATE (PF) 100 MCG/2ML IJ SOLN
INTRAMUSCULAR | Status: DC | PRN
  Administered 2021-01-28 (×3): 50 ug via INTRAVENOUS
  Administered 2021-01-28: 100 ug via INTRAVENOUS
  Administered 2021-01-28: 50 ug via INTRAVENOUS

## 2021-01-28 MED ORDER — TRANEXAMIC ACID-NACL 1000-0.7 MG/100ML-% IV SOLN
INTRAVENOUS | Status: AC
  Filled 2021-01-28: qty 100

## 2021-01-28 SURGICAL SUPPLY — 70 items
BLADE CLIPPER SURG (BLADE) IMPLANT
BUR EGG ELITE 4.0 (BURR) IMPLANT
CABLE BIPOLOR RESECTION CORD (MISCELLANEOUS) ×2 IMPLANT
CAGE TLIF NANOLOCK XL 9 (Cage) ×2 IMPLANT
CANISTER SUCT 3000ML PPV (MISCELLANEOUS) ×2 IMPLANT
CLIP NEUROVISION LG (CLIP) ×2 IMPLANT
CLSR STERI-STRIP ANTIMIC 1/2X4 (GAUZE/BANDAGES/DRESSINGS) ×2 IMPLANT
COVER SURGICAL LIGHT HANDLE (MISCELLANEOUS) ×2 IMPLANT
COVER WAND RF STERILE (DRAPES) IMPLANT
DRAPE C-ARM 42X72 X-RAY (DRAPES) ×2 IMPLANT
DRAPE C-ARMOR (DRAPES) ×2 IMPLANT
DRAPE POUCH INSTRU U-SHP 10X18 (DRAPES) ×2 IMPLANT
DRAPE SURG 17X23 STRL (DRAPES) ×2 IMPLANT
DRAPE U-SHAPE 47X51 STRL (DRAPES) ×2 IMPLANT
DRSG OPSITE POSTOP 4X8 (GAUZE/BANDAGES/DRESSINGS) ×2 IMPLANT
DURAPREP 26ML APPLICATOR (WOUND CARE) ×2 IMPLANT
ELECT BLADE 4.0 EZ CLEAN MEGAD (MISCELLANEOUS) ×2
ELECT BLADE 6.5 EXT (BLADE) IMPLANT
ELECT PENCIL ROCKER SW 15FT (MISCELLANEOUS) ×2 IMPLANT
ELECT REM PT RETURN 9FT ADLT (ELECTROSURGICAL) ×2
ELECTRODE BLDE 4.0 EZ CLN MEGD (MISCELLANEOUS) ×1 IMPLANT
ELECTRODE REM PT RTRN 9FT ADLT (ELECTROSURGICAL) ×1 IMPLANT
GLOVE BIOGEL PI IND STRL 8.5 (GLOVE) ×1 IMPLANT
GLOVE BIOGEL PI INDICATOR 8.5 (GLOVE) ×1
GLOVE SS BIOGEL STRL SZ 8.5 (GLOVE) ×1 IMPLANT
GLOVE SUPERSENSE BIOGEL SZ 8.5 (GLOVE) ×1
GOWN STRL REUS W/ TWL LRG LVL3 (GOWN DISPOSABLE) ×1 IMPLANT
GOWN STRL REUS W/TWL 2XL LVL3 (GOWN DISPOSABLE) ×4 IMPLANT
GOWN STRL REUS W/TWL LRG LVL3 (GOWN DISPOSABLE) ×2
GUIDEWIRE NITINOL BEVEL TIP (WIRE) ×8 IMPLANT
KIT BASIN OR (CUSTOM PROCEDURE TRAY) ×2 IMPLANT
KIT POSITION SURG JACKSON T1 (MISCELLANEOUS) IMPLANT
KIT TURNOVER KIT B (KITS) ×2 IMPLANT
MODULE EMG NEEDLE SSEP NVM5 (NEEDLE) ×2 IMPLANT
MODULE NVM5 NEXT GEN EMG (NEEDLE) ×2 IMPLANT
NEEDLE 22X1 1/2 (OR ONLY) (NEEDLE) ×2 IMPLANT
NEEDLE I-PASS III (NEEDLE) ×2 IMPLANT
NEEDLE SPNL 18GX3.5 QUINCKE PK (NEEDLE) IMPLANT
NS IRRIG 1000ML POUR BTL (IV SOLUTION) ×2 IMPLANT
PACK LAMINECTOMY ORTHO (CUSTOM PROCEDURE TRAY) ×2 IMPLANT
PACK UNIVERSAL I (CUSTOM PROCEDURE TRAY) ×2 IMPLANT
PAD ARMBOARD 7.5X6 YLW CONV (MISCELLANEOUS) ×4 IMPLANT
PATTIES SURGICAL .5 X.5 (GAUZE/BANDAGES/DRESSINGS) IMPLANT
PATTIES SURGICAL .5 X1 (DISPOSABLE) ×2 IMPLANT
POSITIONER HEAD PRONE TRACH (MISCELLANEOUS) ×2 IMPLANT
PROBE BALL TIP NVM5 SNG USE (BALLOONS) ×2 IMPLANT
PUTTY DBX 1CC (Putty) ×2 IMPLANT
PUTTY DBX 1CC DEPUY (Putty) ×1 IMPLANT
REDUCTION EXT RELINE MAS MOD (Neuro Prosthesis/Implant) ×4 IMPLANT
ROD RELINE MAS TI LORD 5.5X40 (Rod) ×4 IMPLANT
SCREW LOCK RELINE 5.5 TULIP (Screw) ×8 IMPLANT
SCREW RELINE MAS POLY 6.5X40MM (Screw) ×4 IMPLANT
SCREW SHANK MAS MOD 6.5X40MM (Screw) ×4 IMPLANT
SPONGE LAP 4X18 RFD (DISPOSABLE) ×4 IMPLANT
SPONGE SURGIFOAM ABS GEL 100 (HEMOSTASIS) ×2 IMPLANT
SURGIFLO W/THROMBIN 8M KIT (HEMOSTASIS) ×4 IMPLANT
SUT BONE WAX W31G (SUTURE) ×2 IMPLANT
SUT MNCRL AB 3-0 PS2 27 (SUTURE) ×4 IMPLANT
SUT VIC AB 1 CT1 18XCR BRD 8 (SUTURE) ×1 IMPLANT
SUT VIC AB 1 CT1 8-18 (SUTURE) ×2
SUT VIC AB 2-0 CT1 18 (SUTURE) ×2 IMPLANT
SUT VIC AB 2-0 CT1 27 (SUTURE) ×2
SUT VIC AB 2-0 CT1 TAPERPNT 27 (SUTURE) ×1 IMPLANT
SYR BULB IRRIG 60ML STRL (SYRINGE) ×2 IMPLANT
SYR CONTROL 10ML LL (SYRINGE) ×2 IMPLANT
TOWEL GREEN STERILE (TOWEL DISPOSABLE) ×4 IMPLANT
TOWEL GREEN STERILE FF (TOWEL DISPOSABLE) ×2 IMPLANT
TRAY FOLEY MTR SLVR 16FR STAT (SET/KITS/TRAYS/PACK) ×2 IMPLANT
WATER STERILE IRR 1000ML POUR (IV SOLUTION) ×2 IMPLANT
YANKAUER SUCT BULB TIP NO VENT (SUCTIONS) ×2 IMPLANT

## 2021-01-28 NOTE — Brief Op Note (Signed)
01/28/2021  3:08 PM  PATIENT:  Andrea Martinez  75 y.o. female  PRE-OPERATIVE DIAGNOSIS:  Right L5 foraminal stenosis, Degenerative disc disease L5-S1  POST-OPERATIVE DIAGNOSIS:  Right L5 foraminal stenosis, Degenerative disc disease L5-S1  PROCEDURE:  Procedure(s) with comments: TRANSFORAMINAL LUMBAR INTERBODY FUSION (TLIF) LUMBAR FIVE-SACRAL ONE (N/A) - 4 hrs  SURGEON:  Surgeon(s) and Role:    Melina Schools, MD - Primary  PHYSICIAN ASSISTANT:   ASSISTANTS: April Green  RNFA   ANESTHESIA:   general  EBL:  100 mL   BLOOD ADMINISTERED:none  DRAINS: none   LOCAL MEDICATIONS USED:  MARCAINE    and OTHER exparel  SPECIMEN:  No Specimen  DISPOSITION OF SPECIMEN:  N/A  COUNTS:  YES  TOURNIQUET:  * No tourniquets in log *  DICTATION: .Dragon Dictation  PLAN OF CARE: Admit for overnight observation  PATIENT DISPOSITION:  PACU - hemodynamically stable.

## 2021-01-28 NOTE — Transfer of Care (Signed)
Immediate Anesthesia Transfer of Care Note  Patient: Andrea Martinez  Procedure(s) Performed: TRANSFORAMINAL LUMBAR INTERBODY FUSION (TLIF) LUMBAR FIVE-SACRAL ONE (N/A Spine Lumbar)  Patient Location: PACU  Anesthesia Type:General  Level of Consciousness: oriented, sedated and patient cooperative  Airway & Oxygen Therapy: Patient Spontanous Breathing and Patient connected to face mask oxygen  Post-op Assessment: Report given to RN and Post -op Vital signs reviewed and stable  Post vital signs: Reviewed  Last Vitals:  Vitals Value Taken Time  BP 123/76 01/28/21 1537  Temp    Pulse 69 01/28/21 1542  Resp 19 01/28/21 1542  SpO2 100 % 01/28/21 1542  Vitals shown include unvalidated device data.  Last Pain:  Vitals:   01/28/21 1042  PainSc: 5          Complications: No complications documented.

## 2021-01-28 NOTE — Progress Notes (Signed)
Per Dr. Rolena Infante-- pt will keep foley in place for the remainder of this evening and will transition to a purewick. Urine culture being sent in PACU

## 2021-01-28 NOTE — Discharge Instructions (Signed)
° ° ° ° °Spinal Fusion, Adult, Care After °This sheet gives you information about how to care for yourself after your procedure. Your doctor may also give you more specific instructions. If you have problems or questions, contact your doctor. °Follow these instructions at home: °Medicines °Take over-the-counter and prescription medicines only as told by your doctor. These include any medicines for pain or blood-thinning medicines (anticoagulants). °If you were prescribed an antibiotic medicine, take it as told by your doctor. Do not stop taking the antibiotic even if you start to feel better. °Do not drive for 24 hours if you were given a medicine to help you relax (sedative) during your procedure. °Do not drive or use heavy machinery while taking prescription pain medicine. °If you have a brace: °Wear the brace as told by your doctor. Take it off only as told by your doctor. °Keep the brace clean. °Managing pain, stiffness, and swelling °If directed, put ice on the surgery area: °If you have a removable brace, take it off as told by your doctor. °Put ice in a plastic bag. °Place a towel between your skin and the bag. °Leave the ice on for 20 minutes, 2-3 times a day. °Surgery cut care ° °  °Follow instructions from your doctor about how to take care of your cut from surgery (incision). Make sure you: °Wash your hands with soap and water before you change your bandage (dressing). If you cannot use soap and water, use hand sanitizer. °Change your bandage as told by your doctor. °Leave stitches (sutures), skin glue, or skin tape (adhesive) strips in place. They may need to stay in place for 2 weeks or longer. If tape strips get loose and curl up, you may trim the loose edges. Do not remove tape strips completely unless your doctor says it is okay. °Keep your cut from surgery clean and dry. °Do not take baths, swim, or use a hot tub until your doctor says it is okay. °Ask your doctor if you can take showers. You may  only be allowed to take sponge baths. °Every day, check your cut from surgery and the area around it for: °More redness, swelling, or pain. °Fluid or blood. °Warmth. °Pus or a bad smell. °If you have a drain tube, follow instructions from your doctor about caring for it. Do not take out the drain tube or any bandages unless your doctor says it is okay. °Physical activity °Rest and protect your back as much as possible. °Follow instructions from your doctor about how to move. Use good posture to help your spine heal. °Do not lift anything that is heavier than 8 lb (3.6 kg), or the limit that you are told, until your doctor says that it is safe. °Do not twist or bend at the waist until your doctor says it is okay. °It is best if you: °Do not make pushing and pulling motions. °Do not sit or lie down in the same position for a long time. °Do not raise your hands or arms above your head. °Return to your normal activities as told by your doctor. Ask your doctor what activities are safe for you. Rest and protect your back as much as you can. °Do not start to exercise until your doctor says it is okay. Ask your doctor what kinds of exercise you can do to make your back stronger. °Ok to shower in 5 days.  Do not take a bath or submerge the wound °General instructions °To prevent blood clots and   lessen swelling in your legs: °Wear compression stockings as told. °Walk one or more times every few hours as told by your doctor. °Do not use any products that contain nicotine or tobacco, such as cigarettes and e-cigarettes. These can delay bone healing. If you need help quitting, ask your doctor. °To prevent or treat constipation while you are taking prescription pain medicine, your doctor may suggest that you: °Drink enough fluid to keep your pee (urine) pale yellow. °Take over-the-counter or prescription medicines. °Eat foods that are high in fiber. These include fresh fruits and vegetables, whole grains, and beans. °Limit foods  that are high in fat and processed sugars, such as fried and sweet foods. °Keep all follow-up visits as told by your doctor. This is important. °Contact a doctor if: °Your pain gets worse. °Your medicine does not help your pain. °Your legs or feet get painful or swollen. °Your cut from surgery is more red, swollen, or painful. °Your cut from surgery feels warm to the touch. °You have: °Fluid or blood coming from your cut from surgery. °Pus or a bad smell coming from your cut from surgery. °A fever. °Weakness or loss of feeling (numbness) in your legs that is new or getting worse. °Trouble controlling when you pee (urinate) or poop (have a bowel movement). °You feel sick to your stomach (nauseous). °You throw up (vomit). °Get help right away if: °Your pain is very bad. °You have chest pain. °You have trouble breathing. °You start to have a cough. °These symptoms may be an emergency. Do not wait to see if the symptoms will go away. Get medical help right away. Call your local emergency services (911 in the [place] Do not drive yourself to the hospital. °Summary °After the procedure, it is common to have pain in your back and pain by your surgery cut(s). °Icing and pain medicines may help to control the pain. Follow directions from your doctor. °Rest and protect your back as much as possible. Do not twist or bend at the waist. °Get up and walk one or more times every few hours as told by your doctor. °This information is not intended to replace advice given to you by your health care provider. Make sure you discuss any questions you have with your health care provider.  °

## 2021-01-28 NOTE — Anesthesia Postprocedure Evaluation (Signed)
Anesthesia Post Note  Patient: Andrea Martinez  Procedure(s) Performed: TRANSFORAMINAL LUMBAR INTERBODY FUSION (TLIF) LUMBAR FIVE-SACRAL ONE (N/A Spine Lumbar)     Patient location during evaluation: PACU Anesthesia Type: General Level of consciousness: awake and alert and oriented Pain management: pain level controlled Vital Signs Assessment: post-procedure vital signs reviewed and stable Respiratory status: spontaneous breathing, nonlabored ventilation and respiratory function stable Cardiovascular status: blood pressure returned to baseline Postop Assessment: no apparent nausea or vomiting Anesthetic complications: no   No complications documented.                Brennan Bailey

## 2021-01-28 NOTE — Progress Notes (Signed)
Patient arrived to unit from PACU via wheelchair. Patient needs 2 assist to get in bed. Patient still drowsy from medication given. Family at bedside noted dried blood on patient's hair. PACU was called and RN from PACU stated MD was aware of blood in patient's hair. Staff cleaned patient's hair and no wound or bruise noted. Family in room noted no bruising on patient's head. Will continue to monitor patient.

## 2021-01-28 NOTE — H&P (Signed)
Addendum H&P  Patient continues to have severe back buttock and neuropathic left leg pain.  She has foraminal stenosis with L5 nerve root compression as well as lateral recess stenosis further compromising the traversing S1 nerve root.  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  Because of the failure of conservative management we elected to move forward with a Gill decompression on the left side to decompress the nerve root and help alleviate her radicular leg pain.  Due to the fact that this would create instability we will supplement this with an interbody fusion with pedicle screw fixation.  I have discussed the surgery as well as the risks and benefits with the patient and she is expressed an understanding as well as willingness to move forward with surgery.  All questions were addressed.

## 2021-01-28 NOTE — Op Note (Signed)
Operative report  Preoperative diagnosis: Left L5 radiculopathy due to foraminal stenosis and degenerative disc disease L5-S1  Postoperative diagnosis: Same  First Assistant: April Greene, RNFA  Operative procedure: 1. Left Gill decompression with complete inferior L5 facetectomy and laminotomy. 2.  Removal of intervertebral disc and implantation of intervertebral cage. 3.  Pedicle screw fixation L5-S1. 4.  L5-S1 intervertebral arthrodesis utilizing autograft and DBX.  Complications: None  EBL: 100 cc  Implants: NuVasive MIS pedicle screws.  6.5 x 40 mm length.  Titan/Medtronic intervertebral cage.  9 mm large.  Graft: Autograft from decompression along with DBX.  Neuro monitoring: No adverse EMG or SSEP activity throughout the entirety of the case.  All 4 pedicle screws were directly stimulated after implantation and there was no adverse activity at greater than 40 mA.  The   Indications: Andrea Martinez is a very pleasant 75 year old woman who continues to have severe back buttock and neuropathic left leg pain.  Imaging studies demonstrated foraminal stenosis with L5 nerve root compression along with degenerative disc disease.  Attempts at conservative management had failed to alleviate her symptoms.  Patient did get temporary significant improvement after L5 selective nerve root block.  Because of the loss in quality of life debilitating pain we elected to move forward with surgery.  All appropriate risks, benefits, alternatives to surgery were discussed with the patient and her husband and consent was obtained  Operative report: Patient is brought the operating room placed supine on the operating room table.  After successful induction of general anesthesia, and endotracheal intubation: Teds, SCDs, and a Foley were inserted.  The patient was then turned prone onto the Wilson frame, and the neuro monitoring representative applied all appropriate needles and pads for intraoperative SSEP and free  running EMG monitoring.  The back was then prepped and draped in a standard fashion.  All bony prominences were well-padded.  Timeout was taken to confirm patient, procedure, and all other important data.  On the right-hand side identified the lateral border of the L5 and S1 pedicles I marked amount and infiltrated the skin with quarter percent Marcaine with epinephrine.  I then made an incision and advanced my Jamshidi needle down to the lateral aspect of the L5 pedicle.  Once I confirmed satisfactory position in the AP plane I then connected the Jamshidi needle to the neuro monitoring device and advanced the Jamshidi needle into the L5 pedicle I confirmed using x-ray that I had the proper trajectory, and there was no electrodiagnostic evidence of breach.  As I neared the medial wall of the pedicle I changed my fluoroscopy to the lateral view confirmed I was just beyond the posterior wall the vertebral body.  I then advanced into the vertebral body and then placed the guidepin to cannulate the pedicle.  I repeated this exact same procedure at S1.  Once both the S1 and L5 pedicles were cannulated on the right side I went to the contralateral side.  I made a small [place] incision and dissected down to the deep fascia on the left side.  I infiltrated the paraspinal muscles with quarter percent Marcaine with Exparel for postoperative analgesia.  I then bluntly dissected through the deep fascia and paraspinal muscles in order to palpate the lateral aspect of the L5-S1 facet.  I then placed a Jamshidi needle along the lateral aspect of the S1 pedicle and confirmed position with fluoroscopy.  Using the same technique I did use on the right side I cannulated the pedicle at L5  and S1.  Again there was no adverse free running EMG activity during the procedure.  At this point all 4 pedicles were cannulated.  I then measured and placed the 6.5 x 40 mm length screw with the appropriate size retractor blades on the  left-hand side.  Once both pedicle screws were down I then directly stimulated and there was no adverse activity greater than 40 mA.  I then placed a medial retractor blade in order to expose completely expose the L5-S1 facet as well as the L5 pars and a portion of the L5 lamina.  Using a double-action Leksell rongeur as well as osteotomes I remove the entire L5 inferior facet.  I then used my 3 mm Kerrison rongeur to perform a generous laminotomy of L5.  At this point I then dissected using a Penfield 4 through the ligamentum flavum in order to create a plane between the ligamentum flavum and the thecal sac.  I then used a 3 and 2 mm Kerrison rongeur to resect the ligamentum flavum and expose the thecal sac.  This allowed me to enter into the lateral recess and resect the medial aspect of the superior S1 facet.  Identified the epidural veins and coagulated with bipolar cautery at this point I noted that the L5 nerve root was significantly compressed in the L5 foramen.  I gently identified the L5 nerve root and create a plane between it and the overlying bone.  I then remove this with a 2 mm Kerrison rongeur.  I then took down the remainder of the superior portion of the S1 facet as well as the L5 pars in order to adequately decompress the L5 nerve root.  At this point I could clearly visualize the L5 nerve root and it was no longer under compression.  With the decompression complete I then turned my attention to the intervertebral fusion.  Neuro patties were placed in order to aid in retraction of the thecal sac and the annulus was incised with a 15 blade scalpel.  Then using a combination of pituitary rongeurs, sidecutting curettes, and scrapers I removed all the disc material from the L5-S1 disc space.  I made sure was able to reach across the contralateral side with my sidecutting curettes to remove all of the disc material.  I then used the smooth paddles to measure and elected to use the size 9 large  spacer.  The implant was obtained and packed with the autograft that harvested from the decompression.  I then placed DBX over the outside portion in order to enhance the overall fusion rate.  At this point with the discectomy complete I irrigated wound copiously normal saline and took additional amounts of autograft impacted along the anterior aspect of the annulus.  I then used the trial inserters to pack this down.  The actual implant was then [facility] into place.  Once it was vertical at the appropriate depth I remove the inserting device and then placed a bone tamp in order to push it into the horizontal position the cage itself came to rest the anterior third of the disc space.  At this point I noted that the implant was below the posterior margin of the vertebral body and was not compressing any of the neuro.  I irrigated the wound copiously normal saline and then remove the retractor blades and attached the polyaxial heads.  I then took down the kyphosis from the [place] frame in order to better restore the upper lumbar lordosis.  I  measured and applied 40 mm length rod and secured the locking caps.  Both locking caps were then tightened and torqued according manufacture standards.  I then went to the right-hand side and over the guide pins placed the same 40 mm length 6.5 millimeter screws.  I again stimulated both screws and there is no adverse activity at greater than 40 mA.  A 40 mm rod was then secured in the locking caps inserted.  Both caps were then torqued/tightened according manufacture standards.  I remove the retracting blades from the polyaxial heads and irrigated the wound copiously with normal saline.  Using bipolar cautery and FloSeal I obtained hemostasis.  After final irrigation I reevaluated the L5 and S1 nerve roots on the left side to ensure that there was no further compression.  Both nerve roots were completely free.  I placed a thrombin-soaked Gelfoam patty over the laminotomy site and  irrigated and closed in a layered fashion with interrupted #1 Vicryl suture, 2-0 Vicryl suture, 3-0 Monocryl.  Steri-Strips and dry dressings were applied and the patient was extubated transfer the PACU without incident.  The end of the case all needle sponge counts were correct.

## 2021-01-28 NOTE — Anesthesia Procedure Notes (Signed)
Procedure Name: Intubation Date/Time: 01/28/2021 12:08 PM Performed by: Jenne Campus, CRNA Pre-anesthesia Checklist: Patient identified, Emergency Drugs available, Suction available and Patient being monitored Patient Re-evaluated:Patient Re-evaluated prior to induction Oxygen Delivery Method: Circle System Utilized Preoxygenation: Pre-oxygenation with 100% oxygen Induction Type: IV induction Ventilation: Mask ventilation without difficulty Laryngoscope Size: Miller and 2 Grade View: Grade I Tube type: Oral Tube size: 7.0 mm Number of attempts: 1 Airway Equipment and Method: Stylet and Oral airway Placement Confirmation: ETT inserted through vocal cords under direct vision,  positive ETCO2 and breath sounds checked- equal and bilateral Secured at: 22 cm Tube secured with: Tape Dental Injury: Teeth and Oropharynx as per pre-operative assessment

## 2021-01-29 LAB — URINE CULTURE
Culture: NO GROWTH
Special Requests: NORMAL

## 2021-01-29 MED ORDER — CHLORHEXIDINE GLUCONATE CLOTH 2 % EX PADS: 6.0000 | MEDICATED_PAD | Freq: Every day | CUTANEOUS | Status: DC

## 2021-01-29 NOTE — Progress Notes (Signed)
    Subjective: Procedure(s) (LRB): TRANSFORAMINAL LUMBAR INTERBODY FUSION (TLIF) LUMBAR FIVE-SACRAL ONE (N/A) 1 Day Post-Op  Patient reports pain as 5 on 0-10 scale.  Reports decreased leg pain reports incisional back pain   Positive void Negative bowel movement Positive flatus Negative chest pain or shortness of breath  Objective: Vital signs in last 24 hours: Temp:  [97.4 F (36.3 C)-98 F (36.7 C)] 98 F (36.7 C) (02/17 0338) Pulse Rate:  [59-84] 62 (02/17 0338) Resp:  [12-20] 18 (02/17 0338) BP: (112-162)/(52-90) 124/52 (02/17 0338) SpO2:  [92 %-100 %] 92 % (02/17 0338) Weight:  [94.3 kg] 94.3 kg (02/16 1023)  Intake/Output from previous day: 02/16 0701 - 02/17 0700 In: 2730 [P.O.:240; I.V.:2090; IV Piggyback:400] Out: 900 [Urine:800; Blood:100]  Labs: Recent Labs    01/26/21 1024  WBC 10.3  RBC 4.36  HCT 38.7  PLT 305   Recent Labs    01/26/21 1024  NA 140  K 4.3  CL 107  CO2 23  BUN 18  CREATININE 0.93  GLUCOSE 104*  CALCIUM 9.2   Recent Labs    01/26/21 1024  INR 1.0    Physical Exam: Neurologically intact Sensation intact distally Dorsiflexion/Plantar flexion intact Incision: dressing C/D/I and no drainage Body mass index is 34.61 kg/m.   Assessment/Plan: Patient stable  xrays n/a Continue mobilization with physical therapy Continue care  Patient reports significant incisional back pain.  Preoperative neuropathic left leg pain has resolved.  At this point time I have reiterated to her the need to get out of bed and mobilize to prevent DVTs, pneumonia, and to improve her pain.  I have indicated her the longer she stays in bed the worse the back pain will get when she attempts to move.  Given her age, and home situation we may look into short-term nursing facility if she is not progressing well with therapy.  My hope is that we will be able to discharge her home on Friday.  Continue oral pain medications.  Will DC IV morphine.  Also  continue muscle relaxers as needed.  Melina Schools, MD [facility] [phone]

## 2021-01-29 NOTE — Evaluation (Signed)
Occupational Therapy Evaluation Patient Details Name: Andrea Martinez MRN: [id number] DOB: 1946-05-01 Today's Date: 01/29/2021    History of Present Illness 75 yo female s/p TLIF L5-S1. PMH including bil TKA, Anxiety, Arthritis, Chronic myelogenous leukemia, Hypothyroidism, Lipoma, Mixed hyperlipidemia, Osteoporosis, PE, Urine incontinence, UTI, and Vitamin D deficiency.     Clinical Impression   PTA, pt was living with her husband who assisted pt with LB ADLs and functional transfers; pt using RW due to back and knee pain. Currently, pt requires Min A for UB ADLs, Mod A for LB ADLs, and Min Guard A for functional mobility using RW. Provided education and handout on back precautions, brace management, grooming, LB ADLs, toileting, and shower transfer with shower seat; pt demonstrated understanding. Pt would benefit from further acute OT to facilitate safe dc and provide education on AE for LB ADLs. Recommend dc to home with HHOT for further OT to optimize safety, independence with ADLs, and return to PLOF.     Follow Up Recommendations  Home health OT;Supervision/Assistance - 24 hour    Equipment Recommendations  None recommended by OT    Recommendations for Other Services PT consult     Precautions / Restrictions Precautions Precautions: Back Precaution Booklet Issued: Yes (comment) Precaution Comments: Pt able to recall 3/3 back precautions Required Braces or Orthoses: Spinal Brace Spinal Brace: Lumbar corset;Applied in sitting position      Mobility Bed Mobility Overal bed mobility: Needs Assistance Bed Mobility: Rolling;Sidelying to Sit Rolling: Min assist Sidelying to sit: Min guard       General bed mobility comments: Min A for supporting trunk during roll. MIn Guard A for safety in pushing into upright posture    Transfers Overall transfer level: Needs assistance Equipment used: Rolling walker (2 wheeled) Transfers: Sit to/from Stand Sit to Stand: Min guard          General transfer comment: Min Guard A for safety    Balance Overall balance assessment: Needs assistance Sitting-balance support: Feet supported;No upper extremity supported Sitting balance-Leahy Scale: Fair     Standing balance support: No upper extremity supported;During functional activity Standing balance-Leahy Scale: Fair                             ADL either performed or assessed with clinical judgement   ADL Overall ADL's : Needs assistance/impaired Eating/Feeding: Set up;Sitting   Grooming: Min guard;Standing   Upper Body Bathing: Minimal assistance;Sitting   Lower Body Bathing: Moderate assistance;Sit to/from stand   Upper Body Dressing : Minimal assistance;Sitting Upper Body Dressing Details (indicate cue type and reason): Min A for donning brace Lower Body Dressing: Moderate assistance;Sit to/from stand Lower Body Dressing Details (indicate cue type and reason): Mod A for starting depends and then pt pulling over hips in standing Toilet Transfer: Min guard;Ambulation;RW;BSC (over toilet)       Tub/ Shower Transfer: Walk-in shower;Min guard;Ambulation;Shower seat;Grab Paediatric nurse Details (indicate cue type and reason): Pt performing shower transfer with close [employer] A Functional mobility during ADLs: Min guard;Rolling walker       Vision Baseline Vision/History: No visual deficits       Perception     Praxis      Pertinent Vitals/Pain Pain Assessment: Faces Faces Pain Scale: Hurts a little bit Pain Location: back Pain Descriptors / Indicators: Sore Pain Intervention(s): Monitored during session;Repositioned     Hand Dominance Right   Extremity/Trunk Assessment Upper Extremity Assessment Upper Extremity  Assessment: Overall WFL for tasks assessed   Lower Extremity Assessment Lower Extremity Assessment: Defer to PT evaluation   Cervical / Trunk Assessment Cervical / Trunk Assessment: Other exceptions Cervical /  Trunk Exceptions: s/p fusion   Communication Communication Communication: No difficulties   Cognition Arousal/Alertness: Awake/alert Behavior During Therapy: WFL for tasks assessed/performed Overall Cognitive Status: Within Functional Limits for tasks assessed                                     General Comments       Exercises     Shoulder Instructions      Home Living Family/patient expects to be discharged to:: Private residence Living Arrangements: Spouse/significant other Available Help at Discharge: Family;Available 24 hours/day Type of Home: House Home Access: Stairs to enter [employer] of Steps: 1 Entrance Stairs-Rails: None Home Layout: Laundry or work area in basement;Able to live on main level with bedroom/bathroom     Bathroom Shower/Tub: Occupational psychologist: Standard     Home Equipment: Environmental consultant - 2 wheels;Shower seat;Bedside commode;Cane - single point;Wheelchair - manual;Toilet riser          Prior Functioning/Environment Level of Independence: Needs assistance  Gait / Transfers Assistance Needed: pt has been using RW dur to pain in Lt knee, he has assist from her husband as needed. ADL's / Homemaking Assistance Needed: pt's husand helps her to get dressed and to help her bathe if needed. pt's son comes by to help with laundry and house work.            OT Problem List: Decreased strength;Decreased range of motion;Decreased activity tolerance;Impaired balance (sitting and/or standing);Decreased knowledge of use of DME or AE;Decreased knowledge of precautions;Decreased safety awareness;Pain      OT Treatment/Interventions: Self-care/ADL training;Therapeutic exercise;DME and/or AE instruction;Energy conservation;Therapeutic activities;Patient/family education    OT Goals(Current goals can be found in the care plan section) Acute Rehab OT Goals Patient Stated Goal: Go home OT Goal Formulation: With patient Time  For Goal Achievement: 02/12/21 Potential to Achieve Goals: Good  OT Frequency: Min 2X/week   Barriers to D/C:            Co-evaluation              AM-PAC OT "6 Clicks" Daily Activity     Outcome Measure Help from another person eating meals?: None Help from another person taking care of personal grooming?: A Little Help from another person toileting, which includes using toliet, bedpan, or urinal?: A Little Help from another person bathing (including washing, rinsing, drying)?: A Lot Help from another person to put on and taking off regular upper body clothing?: A Little Help from another person to put on and taking off regular lower body clothing?: A Lot 6 Click Score: 17   End of Session Equipment Utilized During Treatment: Rolling walker;Gait belt;Back brace Nurse Communication: Mobility status  Activity Tolerance: Patient tolerated treatment well Patient left: Other (comment) (with PT in hallway)  OT Visit Diagnosis: Unsteadiness on feet (R26.81);Other abnormalities of gait and mobility (R26.89);Muscle weakness (generalized) (M62.81);Pain Pain - part of body:  (back)                Time: 3818-2993 OT Time Calculation (min): 29 min Charges:  OT General Charges $OT Visit: 1 Visit OT Evaluation $OT Eval Low Complexity: 1 Low OT Treatments $Self Care/Home Management : 8-22 mins  Geena Weinhold  Verlon Carcione MSOT, OTR/L Acute Rehab Pager: [phone] Office: [place] 01/29/2021, 8:25 AM

## 2021-01-29 NOTE — TOC Initial Note (Signed)
Transition of Care [facility]) - Initial/Assessment Note    Patient Details  Name: Andrea Martinez MRN: [id number] Date of Birth: 06-30-1946  [facility]) CM/SW Contact:    Joanne Chars, LCSW Phone Number: 01/29/2021, 2:16 PM  Clinical Narrative:   CSW met with pt to discuss recommendation for [facility].  Pt reports that she has already arranged through her MD to participate in outpt PT at the [facility] clinic, which she has received services at before.  Discussed HH option and pt declines, prefers to do outpt.  Permission given to speak with husband.  Pt is vaccinated for covid and boosted.  Current equipment in the home: walker, rollator, wheelchair, shower chair, bedside commode.  Pt reports no other needs.                  Expected Discharge Plan: Home/Self Care Barriers to Discharge: Continued Medical Work up   Patient Goals and CMS Choice Patient states their goals for this hospitalization and ongoing recovery are:: "push as hard as I can" CMS Medicare.gov Compare Post Acute Care list provided to::  (NA-pt chooses outpt PT)    Expected Discharge Plan and Services Expected Discharge Plan: Home/Self Care     Post Acute Care Choice:  (outpt PT) Living arrangements for the past 2 months: Single Family Home                 DME Arranged: Other see comment (DME handled by [facility] staff)         HH Arranged: Patient Refused [place]          Prior Living Arrangements/Services Living arrangements for the past 2 months: Single Family Home Lives with:: Spouse Patient language and need for interpreter reviewed:: Yes Do you feel safe going back to the place where you live?: Yes      Need for Family Participation in Patient Care: Yes (Comment) Care giver support system in place?: Yes (comment) Current home services: Other (comment) (none) Criminal Activity/Legal Involvement Pertinent to Current Situation/Hospitalization: No - Comment as needed  Activities of Daily Living Home  Assistive Devices/Equipment: Walker (specify type) ADL Screening (condition at time of admission) Patient's cognitive ability adequate to safely complete daily activities?: Yes Is the patient deaf or have difficulty hearing?: No Does the patient have difficulty seeing, even when wearing glasses/contacts?: No Does the patient have difficulty concentrating, remembering, or making decisions?: No Patient able to express need for assistance with ADLs?: No Does the patient have difficulty dressing or bathing?: Yes Independently performs ADLs?: No Communication: Independent Dressing (OT): Needs assistance Is this a change from baseline?: Change from baseline, expected to last <3days Grooming: Independent Feeding: Independent Bathing: Needs assistance Is this a change from baseline?: Change from baseline, expected to last <3 days Toileting: Needs assistance Is this a change from baseline?: Change from baseline, expected to last <3 days In/Out Bed: Needs assistance Is this a change from baseline?: Change from baseline, expected to last <3 days Does the patient have difficulty walking or climbing stairs?: Yes Weakness of Legs: Both Weakness of Arms/Hands: None  Permission Sought/Granted Permission sought to share information with : Family Supports Permission granted to share information with : Yes, Verbal Permission Granted  Share Information with NAME: husband [name]           Emotional Assessment Appearance:: Appears stated age Attitude/Demeanor/Rapport: Engaged,Self-Confident Affect (typically observed): Appropriate,Pleasant Orientation: : Oriented to Self,Oriented to Place,Oriented to  Time,Oriented to Situation Alcohol / Substance Use: Not Applicable  Psych Involvement: No (comment)  Admission diagnosis:  S/P lumbar fusion [Z98.1] Patient Active Problem List   Diagnosis Date Noted  . S/P lumbar fusion 01/28/2021  . Mixed hyperlipidemia   . Atherosclerosis of aorta ([place])  09/14/2020  . Anxiety 09/14/2020  . Chronic low back pain 09/14/2020  . Thoracic compression fracture ([place]) 02/14/2019  . Sweating abnormality 10/11/2017  . Iron deficiency anemia 02/16/2017  . Solitary pulmonary nodule 10/23/2016  . Bladder prolapse, female, acquired 11/21/2015  . IBS (irritable bowel syndrome) 11/21/2015  . Neutrophilic leukocytosis 12/05/4974  . OA (osteoarthritis) of knee 11/21/2015  . CML (chronic myeloid leukemia) ([place]) 11/20/2015  . Dyspepsia 11/30/2012  . Hypothyroidism 02/11/2010   PCP:  Loman Brooklyn, FNP Pharmacy:   [id number] [id number]: [phone]: [phone]    Social Determinants of Health (SDOH) Interventions    Readmission Risk Interventions No flowsheet data found.

## 2021-01-29 NOTE — Evaluation (Signed)
Physical Therapy Evaluation Patient Details Name: Andrea Martinez MRN: [id number] DOB: 1946/10/30 Today's Date: 01/29/2021   History of Present Illness  75 yo female s/p TLIF L5-S1. PMH including bil TKA, Anxiety, Arthritis, Chronic myelogenous leukemia, Hypothyroidism, Lipoma, Mixed hyperlipidemia, Osteoporosis, PE, Urine incontinence, UTI, and Vitamin D deficiency.  Clinical Impression  Pt admitted with above diagnosis. At the time of PT eval, pt was able to demonstrate transfers and ambulation with gross min guard assist and RW for support. Pt was educated on precautions, brace application/wearing schedule, appropriate activity progression, and car transfer. Pt currently with functional limitations due to the deficits listed below (see PT Problem List). Pt will benefit from skilled PT to increase their independence and safety with mobility to allow discharge to the venue listed below.      Follow Up Recommendations Home health PT;Supervision for mobility/OOB    Equipment Recommendations  None recommended by PT    Recommendations for Other Services       Precautions / Restrictions Precautions Precautions: Back Precaution Booklet Issued: Yes (comment) Precaution Comments: Reviewed handout and pt was cued for precautions during functional mobility. Required Braces or Orthoses: Spinal Brace Spinal Brace: Lumbar corset;Applied in sitting position Restrictions Weight Bearing Restrictions: No      Mobility  Bed Mobility Overal bed mobility: Needs Assistance Bed Mobility: Rolling;Sidelying to Sit Rolling: Min assist Sidelying to sit: Min guard       General bed mobility comments: Pt was received in the hallway with OT    Transfers Overall transfer level: Needs assistance Equipment used: Rolling walker (2 wheeled) Transfers: Sit to/from Stand Sit to Stand: Min guard         General transfer comment: VC's for hand placement on seated surface for  safety.  Ambulation/Gait Ambulation/Gait assistance: Min guard Gait Distance (Feet): 200 Feet Assistive device: Rolling walker (2 wheeled) Gait Pattern/deviations: Step-through pattern;Decreased stride length;Trunk flexed Gait velocity: Decreased Gait velocity interpretation: <1.31 ft/sec, indicative of household ambulator General Gait Details: Slow and guarded. Distracted by story telling and required cues to continue on with gait training. VC's for improved posture, closer walker proximity, and forwards gaze.  Stairs            Wheelchair Mobility    Modified Rankin (Stroke Patients Only)       Balance Overall balance assessment: Needs assistance Sitting-balance support: Feet supported;No upper extremity supported Sitting balance-Leahy Scale: Fair     Standing balance support: No upper extremity supported;During functional activity Standing balance-Leahy Scale: Poor Standing balance comment: For dynamic standing activity                             Pertinent Vitals/Pain Pain Assessment: Faces Faces Pain Scale: Hurts a little bit Pain Location: back Pain Descriptors / Indicators: Sore Pain Intervention(s): Limited activity within patient's tolerance;Monitored during session;Repositioned    Home Living Family/patient expects to be discharged to:: Private residence Living Arrangements: Spouse/significant other Available Help at Discharge: Family;Available 24 hours/day Type of Home: House Home Access: Stairs to enter Entrance Stairs-Rails: None Entrance Stairs-Number of Steps: 1 Home Layout: Laundry or work area in basement;Able to live on main level with bedroom/bathroom Home Equipment: Environmental consultant - 2 wheels;Shower seat;Bedside commode;Cane - single point;Wheelchair - manual;Toilet riser      Prior Function Level of Independence: Needs assistance   Gait / Transfers Assistance Needed: pt has been using RW dur to pain in Lt knee, he has assist from her  husband as needed.  ADL's / Homemaking Assistance Needed: pt's husand helps her to get dressed and to help her bathe if needed. pt's son comes by to help with laundry and house work.        Hand Dominance   Dominant Hand: Right    Extremity/Trunk Assessment   Upper Extremity Assessment Upper Extremity Assessment: Defer to OT evaluation    Lower Extremity Assessment Lower Extremity Assessment: Generalized weakness    Cervical / Trunk Assessment Cervical / Trunk Assessment: Other exceptions Cervical / Trunk Exceptions: s/p fusion  Communication   Communication: No difficulties  Cognition Arousal/Alertness: Awake/alert Behavior During Therapy: WFL for tasks assessed/performed Overall Cognitive Status: Within Functional Limits for tasks assessed                                        General Comments      Exercises     Assessment/Plan    PT Assessment Patient needs continued PT services  PT Problem List Decreased strength;Decreased activity tolerance;Decreased balance;Decreased mobility;Decreased knowledge of use of DME;Decreased safety awareness;Decreased knowledge of precautions;Pain       PT Treatment Interventions DME instruction;Gait training;Functional mobility training;Therapeutic activities;Therapeutic exercise;Balance training;Neuromuscular re-education;Cognitive remediation;Patient/family education    PT Goals (Current goals can be found in the Care Plan section)  Acute Rehab PT Goals Patient Stated Goal: Go home PT Goal Formulation: With patient Time For Goal Achievement: 02/05/21 Potential to Achieve Goals: Good    Frequency Min 5X/week   Barriers to discharge        Co-evaluation               AM-PAC PT "6 Clicks" Mobility  Outcome Measure Help needed turning from your back to your side while in a flat bed without using bedrails?: A Little Help needed moving from lying on your back to sitting on the side of a flat bed  without using bedrails?: A Little Help needed moving to and from a bed to a chair (including a wheelchair)?: A Little Help needed standing up from a chair using your arms (e.g., wheelchair or bedside chair)?: A Little Help needed to walk in hospital room?: A Little Help needed climbing 3-5 steps with a railing? : A Lot 6 Click Score: 17    End of Session Equipment Utilized During Treatment: Gait belt;Back brace Activity Tolerance: Patient tolerated treatment well Patient left: in chair;with call bell/phone within reach Nurse Communication: Mobility status PT Visit Diagnosis: Unsteadiness on feet (R26.81);Pain Pain - part of body:  (back)    Time: 0810-0829 PT Time Calculation (min) (ACUTE ONLY): 19 min   Charges:   PT Evaluation $PT Eval Low Complexity: 1 Low          Rolinda Roan, PT, DPT Acute Rehabilitation Services Pager: [phone] Office: [phone]   Thelma Comp 01/29/2021, 11:11 AM

## 2021-01-30 ENCOUNTER — Encounter (HOSPITAL_COMMUNITY): Payer: Self-pay | Admitting: Orthopedic Surgery

## 2021-01-30 NOTE — Progress Notes (Signed)
Physical Therapy Treatment Patient Details Name: Andrea Martinez MRN: [id number] DOB: 09-04-1946 Today's Date: 01/30/2021    History of Present Illness 75 yo female s/p TLIF L5-S1. PMH including bil TKA, Anxiety, Arthritis, Chronic myelogenous leukemia, Hypothyroidism, Lipoma, Mixed hyperlipidemia, Osteoporosis, PE, Urine incontinence, UTI, and Vitamin D deficiency.    PT Comments    Pt progressing slowly towards physical therapy goals. Pt continues to be at a high risk for falls and demonstrated several instances of knee buckling. Overall minor during ambulation however pt had major knee buckle when turning to sit in the chair, and required max assist to prevent fall and be lowered into the chair. Issued pt a gait belt and educated husband on safe guarding/assist with entrance into home. Feel pain medication is contributing to the knee buckling as she is somewhat lethargic as well. Will continue to follow.    Follow Up Recommendations  Home health PT;Supervision for mobility/OOB     Equipment Recommendations  None recommended by PT    Recommendations for Other Services       Precautions / Restrictions Precautions Precautions: Back Precaution Booklet Issued: Yes (comment) Precaution Comments: Reviewed handout and pt was cued for precautions during functional mobility. Required Braces or Orthoses: Spinal Brace Spinal Brace: Lumbar corset;Applied in sitting position Restrictions Weight Bearing Restrictions: No    Mobility  Bed Mobility Overal bed mobility: Needs Assistance Bed Mobility: Rolling;Sidelying to Sit Rolling: Mod assist;Min assist Sidelying to sit: Min assist;Mod assist       General bed mobility comments: Pt sitting up in recliner chair upon PT arrival.    Transfers Overall transfer level: Needs assistance Equipment used: Rolling walker (2 wheeled) Transfers: Sit to/from Stand Sit to Stand: Min assist;Max assist Stand pivot transfers: Min assist;Mod assist        General transfer comment: Initially multiple attempts and min assist provided for power up to full stand. At end of session pt with LE buckling and sudden drop when turning to the chair. Max assist provided to get hips around to the chair.  Ambulation/Gait Ambulation/Gait assistance: Min guard Gait Distance (Feet): 150 Feet Assistive device: Rolling walker (2 wheeled) Gait Pattern/deviations: Step-through pattern;Decreased stride length;Trunk flexed Gait velocity: Decreased Gait velocity interpretation: <1.31 ft/sec, indicative of household ambulator General Gait Details: Slow and guarded. VC's for improved posture, closer walker proximity, and forwards gaze. Noted minor buckling as pt fatigued but was able to manage with UE support on the RW.   Stairs             Wheelchair Mobility    Modified Rankin (Stroke Patients Only)       Balance Overall balance assessment: Needs assistance Sitting-balance support: Feet supported;No upper extremity supported Sitting balance-Leahy Scale: Poor   Postural control: Posterior lean Standing balance support: No upper extremity supported;During functional activity Standing balance-Leahy Scale: Poor Standing balance comment: For dynamic standing activity                            Cognition Arousal/Alertness: Lethargic;Suspect due to medications Behavior During Therapy: [facility] for tasks assessed/performed Overall Cognitive Status: Impaired/Different from baseline Area of Impairment: Memory;Problem solving;Awareness;Following commands;Safety/judgement                     Memory: Decreased short-term memory Following Commands: Follows one step commands with increased time;Follows one step commands inconsistently Safety/Judgement: Decreased awareness of safety;Decreased awareness of deficits Awareness: Intellectual;Emergent Problem Solving: Slow processing;Requires verbal cues;Decreased  initiation;Difficulty  sequencing;Requires tactile cues        Exercises      General Comments        Pertinent Vitals/Pain Pain Assessment: Faces Faces Pain Scale: Hurts even more Pain Location: back Pain Descriptors / Indicators: Sore;Throbbing;Tender;Grimacing;Constant Pain Intervention(s): Limited activity within patient's tolerance;Monitored during session;Repositioned    Home Living                      Prior Function            PT Goals (current goals can now be found in the care plan section) Acute Rehab PT Goals Patient Stated Goal: Go home PT Goal Formulation: With patient Time For Goal Achievement: 02/05/21 Potential to Achieve Goals: Good Progress towards PT goals: Progressing toward goals    Frequency    Min 5X/week      PT Plan Current plan remains appropriate    Co-evaluation              AM-PAC PT "6 Clicks" Mobility   Outcome Measure  Help needed turning from your back to your side while in a flat bed without using bedrails?: A Little Help needed moving from lying on your back to sitting on the side of a flat bed without using bedrails?: A Little Help needed moving to and from a bed to a chair (including a wheelchair)?: A Little Help needed standing up from a chair using your arms (e.g., wheelchair or bedside chair)?: A Little Help needed to walk in hospital room?: A Little Help needed climbing 3-5 steps with a railing? : A Lot 6 Click Score: 17    End of Session Equipment Utilized During Treatment: Gait belt;Back brace Activity Tolerance: Patient tolerated treatment well Patient left: in chair;with call bell/phone within reach Nurse Communication: Mobility status PT Visit Diagnosis: Unsteadiness on feet (R26.81);Pain Pain - part of body:  (back)     Time: 1287-8676 PT Time Calculation (min) (ACUTE ONLY): 28 min  Charges:  $Gait Training: 23-37 mins                     Rolinda Roan, PT, DPT Acute Rehabilitation Services Pager:  [phone] Office: [phone]     Thelma Comp 01/30/2021, 10:55 AM

## 2021-01-30 NOTE — Progress Notes (Signed)
    Subjective: Procedure(s) (LRB): TRANSFORAMINAL LUMBAR INTERBODY FUSION (TLIF) LUMBAR FIVE-SACRAL ONE (N/A) 2 Days Post-Op  Patient reports pain as 3 on 0-10 scale.  Reports decreased leg pain reports incisional back pain   Positive void Negative bowel movement Positive flatus Negative chest pain or shortness of breath  Objective: Vital signs in last 24 hours: Temp:  [98.3 F (36.8 C)-99.8 F (37.7 C)] 98.3 F (36.8 C) (02/18 0720) Pulse Rate:  [64-89] 78 (02/18 0720) Resp:  [18-20] 18 (02/18 0720) BP: (112-135)/(41-61) 114/41 (02/18 0720) SpO2:  [90 %-100 %] 95 % (02/18 0720)  Intake/Output from previous day: 02/17 0701 - 02/18 0700 In: 480 [P.O.:480] Out: -   Labs: No results for input(s): WBC, RBC, HCT, PLT in the last 72 hours. No results for input(s): NA, K, CL, CO2, BUN, CREATININE, GLUCOSE, CALCIUM in the last 72 hours. No results for input(s): LABPT, INR in the last 72 hours.  Physical Exam: Neurologically intact ABD soft Intact pulses distally Incision: dressing C/D/I and no drainage Compartment soft Body mass index is 34.61 kg/m.   Assessment/Plan: Patient stable  xrays n/a Continue mobilization with physical therapy Continue care  Patient doing well - excellent progress  Ambulating with decreased back pain.  Pre-op radicular leg pain resolved Plan on d/c to home today if cleared by PT/OT and HH is set up Follow up in 2 weeks for wound check  Melina Schools, MD [facility] [phone]

## 2021-01-30 NOTE — Progress Notes (Signed)
Occupational Therapy Treatment Patient Details Name: Andrea Martinez MRN: [id number] DOB: Dec 03, 1946 Today's Date: 01/30/2021    History of present illness 75 yo female s/p TLIF L5-S1. PMH including bil TKA, Anxiety, Arthritis, Chronic myelogenous leukemia, Hypothyroidism, Lipoma, Mixed hyperlipidemia, Osteoporosis, PE, Urine incontinence, UTI, and Vitamin D deficiency.   OT comments  Pt present this session with decreased arousal, balance, strength, and cognition. Pt requiring Min-Mod A for bed mobility and then present with posterior lean while sitting at EOB. Pt performing sit<>stand at EOB with Min Guard A; however, pt unable to maintain standing balance and presenting with posterior lean and buckling of both knees. Focused session on safely transferring to recliner and pt requiring Mod A and RW to maintain standing due to knees buckling. Attempting education on LB ADLs with AE. Pt however, presenting with poor arousal, attention, and following of cues and required Mod A and Max cues. Continue to recommend dc to home with HHOT and will continue to follow acutely as admitted. However, pt with poor arousal, safety, and strength to be able to dc this AM. Notified RN.    Follow Up Recommendations  Home health OT;Supervision/Assistance - 24 hour    Equipment Recommendations  None recommended by OT    Recommendations for Other Services PT consult    Precautions / Restrictions Precautions Precautions: Back Precaution Booklet Issued: Yes (comment) Precaution Comments: Reviewed handout and pt was cued for precautions during functional mobility. Required Braces or Orthoses: Spinal Brace Spinal Brace: Lumbar corset;Applied in sitting position       Mobility Bed Mobility Overal bed mobility: Needs Assistance Bed Mobility: Rolling;Sidelying to Sit Rolling: Mod assist;Min assist Sidelying to sit: Min assist;Mod assist       General bed mobility comments: Requiring increased assistance to  perform log roll as pt presenting with posterior lean and poor control. flucuating between Min-Mod A for protecting back and maintain precautions  Transfers Overall transfer level: Needs assistance Equipment used: Rolling walker (2 wheeled) Transfers: Sit to/from [facility] Sit to Stand: Min guard Stand pivot transfers: Min assist;Mod assist       General transfer comment: Min Guard A for safety and cues on hand placement. Performing sit<>Stand 4x from EOB and each time needing cues for hand placement. Noting bil knees buckling in standing and requiring Min-Mod A for maintaining standing during pivot to recliner    Balance Overall balance assessment: Needs assistance Sitting-balance support: Feet supported;No upper extremity supported Sitting balance-Leahy Scale: Poor   Postural control: Posterior lean Standing balance support: No upper extremity supported;During functional activity Standing balance-Leahy Scale: Poor Standing balance comment: For dynamic standing activity                           ADL either performed or assessed with clinical judgement   ADL Overall ADL's : Needs assistance/impaired                     Lower Body Dressing: Minimal assistance;With adaptive equipment;Cueing for sequencing;Adhering to back precautions;Sit to/from stand Lower Body Dressing Details (indicate cue type and reason): Providing education on use of AE for LB ADLs. Pt donning socks with sock aide and Min A for management. Doffing socks with reacher and significant time. Min A for use of reacher to don dependent. Providing handout for AE pictures. Toilet Transfer: Minimal assistance;Moderate assistance;Stand-pivot (simulated to recliner)           Functional mobility during ADLs:  Minimal assistance;+2 for physical assistance;Rolling walker General ADL Comments: Pt with decreased cognition and awareness this session. Poor standing balance and buckling of  Bil knees. After stand pivot to recliner, PRoviding education on AE for LB ADLs.     Vision       Perception     Praxis      Cognition Arousal/Alertness: Lethargic;Suspect due to medications Behavior During Therapy: [facility] for tasks assessed/performed Overall Cognitive Status: Impaired/Different from baseline Area of Impairment: Memory;Problem solving;Awareness;Following commands;Safety/judgement                     Memory: Decreased short-term memory Following Commands: Follows one step commands with increased time;Follows one step commands inconsistently Safety/Judgement: Decreased awareness of safety;Decreased awareness of deficits Awareness: Intellectual;Emergent Problem Solving: Slow processing;Requires verbal cues;Decreased initiation;Difficulty sequencing;Requires tactile cues          Exercises     Shoulder Instructions       General Comments      Pertinent Vitals/ Pain       Pain Assessment: Faces Faces Pain Scale: Hurts even more Pain Location: back Pain Descriptors / Indicators: Sore;Throbbing;Tender;Grimacing;Constant Pain Intervention(s): Limited activity within patient's tolerance;Monitored during session;Repositioned  Home Living                                          Prior Functioning/Environment              Frequency  Min 2X/week        Progress Toward Goals  OT Goals(current goals can now be found in the care plan section)  Progress towards OT goals: Not progressing toward goals - comment (More lethargic and weak)  Acute Rehab OT Goals Patient Stated Goal: Go home OT Goal Formulation: With patient Time For Goal Achievement: 02/12/21 Potential to Achieve Goals: Good ADL Goals Pt Will Perform Lower Body Dressing: with modified independence;sit to/from stand;with adaptive equipment;with caregiver independent in assisting Pt Will Transfer to Toilet: with modified independence;ambulating;bedside commode Pt  Will Perform Toileting - Clothing Manipulation and hygiene: with modified independence;sitting/lateral leans;sit to/from stand Additional ADL Goal #1: Pt will perform bed mobility using log roll technique with Supervision in preparation for ADLs Additional ADL Goal #2: Pt will don/doff back brace with supervision  Plan Discharge plan remains appropriate    Co-evaluation                 AM-PAC OT "6 Clicks" Daily Activity     Outcome Measure   Help from another person eating meals?: None Help from another person taking care of personal grooming?: A Little Help from another person toileting, which includes using toliet, bedpan, or urinal?: A Little Help from another person bathing (including washing, rinsing, drying)?: A Lot Help from another person to put on and taking off regular upper body clothing?: A Little Help from another person to put on and taking off regular lower body clothing?: A Lot 6 Click Score: 17    End of Session Equipment Utilized During Treatment: Rolling walker;Back brace  OT Visit Diagnosis: Unsteadiness on feet (R26.81);Other abnormalities of gait and mobility (R26.89);Muscle weakness (generalized) (M62.81);Pain Pain - part of body:  (Back)   Activity Tolerance Patient tolerated treatment well   Patient Left in chair;with call bell/phone within reach   Nurse Communication Mobility status        Time: 4010-2725 OT Time Calculation (min): 35  min  Charges: OT General Charges $OT Visit: 1 Visit OT Treatments $Self Care/Home Management : 23-37 mins  [place], OTR/L Acute Rehab Pager: [phone] Office: [place] 01/30/2021, 8:23 AM

## 2021-01-30 NOTE — TOC Transition Note (Signed)
Transition of Care [facility]) - CM/SW Discharge Note   Patient Details  Name: Andrea Martinez MRN: [id number] Date of Birth: [birth year]  [facility] CM/SW Contact:  Joanne Chars, LCSW Phone Number: 01/30/2021, 4:16 PM   Clinical Narrative:  Pt now requesting HH.  Pt accepted by [facility].       Barriers to Discharge: Continued Medical Work up   Patient Goals and CMS Choice Patient states their goals for this hospitalization and ongoing recovery are:: "push as hard as I can" CMS Medicare.gov Compare Post Acute Care list provided to::  (NA-pt chooses outpt PT)    Discharge Placement                       Discharge Plan and Services     Post Acute Care Choice:  (outpt PT)          DME Arranged: Other see comment (DME handled by [facility] staff)         [place] Arranged: PT,Nurse's Aide [place] Agency: [facility] (formerly [employer]) Date [place]: 01/30/21 Time [place]: [place] Representative spoke with at [place]: [place]  Social Determinants of Health ([place]) Interventions     Readmission Risk Interventions No flowsheet data found.

## 2021-01-31 NOTE — Progress Notes (Signed)
Patient is discharged from room 3C04 at this time. IV site d/c'd and instructions read to patient and spouse with understanding verbalized and all questions answered. Left unit via wheelchair with all belongings at side. Upon transferring patient from the wheelchair into the car, patient started hyperventilating and holding back from entering the car. This Probation officer asked her if she wants to return back to the unit and she screamed "NO, I want to go home." Writer encouraged patient and with help of husband and another nurse, patient was transferred into the car. No adverse event noted. Husband stated patient has been hyperventilating like this at home whenever she has to get up since she had her knee surgery. Nurse advised patient to ask his children to assist when they get home.

## 2021-01-31 NOTE — Progress Notes (Signed)
Physical Therapy Treatment Patient Details Name: Andrea Martinez MRN: [id number] DOB: Sep 27, 1946 Today's Date: 01/31/2021    History of Present Illness 75 yo female s/p TLIF L5-S1. PMH including bil TKA, Anxiety, Arthritis, Chronic myelogenous leukemia, Hypothyroidism, Lipoma, Mixed hyperlipidemia, Osteoporosis, PE, Urine incontinence, UTI, and Vitamin D deficiency.    PT Comments    Pt seen with PTA and OT present as she is requiring increased assist this session. Pt ambulated to the restroom with min A +2, however became very anxious and fearful during session requiring heavy mod A +2 to progress back to recliner chair. Pt's attention would come in and out of focus, requiring frequent VC to stay on task. Pt required varying degrees of assist with transfers, depending on her level of anxiety at that time. Relaxation and breathing techniques utilized throughout session. Advised husband that they would need to utilize [facility] [place] to assist pt into the house at d/c. Pt is expected to d/c today with HHPT to follow up.      Follow Up Recommendations  Home health PT;Supervision for mobility/OOB     Equipment Recommendations  None recommended by PT    Recommendations for Other Services       Precautions / Restrictions Precautions Precautions: Back Precaution Booklet Issued: Yes (comment) Precaution Comments: Reviewed handout and pt was cued for precautions during functional mobility. Required Braces or Orthoses: Spinal Brace Spinal Brace: Lumbar corset;Applied in sitting position    Mobility  Bed Mobility Overal bed mobility: Needs Assistance Bed Mobility: Rolling;Sidelying to Sit Rolling: Mod assist;Min assist Sidelying to sit: Min assist;Mod assist       General bed mobility comments: Pt sitting up in recliner chair upon arrival    Transfers Overall transfer level: Needs assistance Equipment used: Rolling walker (2 wheeled) Transfers: Sit to/from Stand Sit to Stand: Min guard;Min  assist;Mod assist;+2 safety/equipment;+2 physical assistance Stand pivot transfers: Min assist;Mod assist       General transfer comment: multiple attempts to rise, with pt fearful and sitting back down quickly. She rose several times with min guard to perform peri care, but when rising to ambulate, pt required increased assist, likely due anticipating pain than for any real need for assist. At times she became resistive to standing with significant posterior lean requiring mod A to remain upright.  Ambulation/Gait Ambulation/Gait assistance: Mod assist;+2 physical assistance;+2 safety/equipment;Min assist Gait Distance (Feet): 12 Feet (12x2) Assistive device: Rolling walker (2 wheeled) Gait Pattern/deviations: Decreased stride length;Trunk flexed;Step-to pattern;Shuffle Gait velocity: Decreased Gait velocity interpretation: <1.31 ft/sec, indicative of household ambulator General Gait Details: Pt ambulated to bathroom with min A to prevent LE buckling. Pt's anxiety increased significanlty during session, requiring heavy mod A +2 to progress from bathroom back to recliner chair. Physical assist given on LLE to prevent buckling and progress forward. Assist on RLE to progress forward and assist for balance.   Stairs             Wheelchair Mobility    Modified Rankin (Stroke Patients Only)       Balance Overall balance assessment: Needs assistance Sitting-balance support: Feet supported;No upper extremity supported Sitting balance-Leahy Scale: Poor   Postural control: Posterior lean Standing balance support: No upper extremity supported;During functional activity Standing balance-Leahy Scale: Poor Standing balance comment: able to stand with RW and perform peri care                            Cognition Arousal/Alertness: Lethargic;Suspect due  to medications Behavior During Therapy: Anxious Overall Cognitive Status: Impaired/Different from baseline Area of  Impairment: Memory;Problem solving;Awareness;Following commands;Safety/judgement;Attention                   Current Attention Level: Sustained Memory: Decreased short-term memory Following Commands: Follows one step commands with increased time;Follows one step commands inconsistently Safety/Judgement: Decreased awareness of safety;Decreased awareness of deficits Awareness: Intellectual;Emergent Problem Solving: Slow processing;Requires verbal cues;Decreased initiation;Difficulty sequencing;Requires tactile cues General Comments: Pt appears very anxious and fearful of mobility this session. She required frequent cues to stay on task. Multiple cues for slowed breathing and calming techniques to reduce anxiety.      Exercises      General Comments General comments (skin integrity, edema, etc.): husband present for session      Pertinent Vitals/Pain Pain Assessment: Faces Faces Pain Scale: Hurts even more Pain Location: back Pain Descriptors / Indicators: Sore;Throbbing;Tender;Grimacing;Constant Pain Intervention(s): Monitored during session;Limited activity within patient's tolerance    Home Living                      Prior Function            PT Goals (current goals can now be found in the care plan section) Acute Rehab PT Goals Patient Stated Goal: Go home PT Goal Formulation: With patient Time For Goal Achievement: 02/05/21 Potential to Achieve Goals: Good Progress towards PT goals: Not progressing toward goals - comment (limited by anxiety)    Frequency    Min 5X/week      PT Plan Current plan remains appropriate    Co-evaluation PT/OT/SLP Co-Evaluation/Treatment: Yes Reason for Co-Treatment: Complexity of the patient's impairments (multi-system involvement);Necessary to address cognition/behavior during functional activity;For patient/therapist safety;To address functional/ADL transfers PT goals addressed during session: Mobility/safety with  mobility        AM-PAC PT "6 Clicks" Mobility   Outcome Measure  Help needed turning from your back to your side while in a flat bed without using bedrails?: A Little Help needed moving from lying on your back to sitting on the side of a flat bed without using bedrails?: A Little Help needed moving to and from a bed to a chair (including a wheelchair)?: A Little Help needed standing up from a chair using your arms (e.g., wheelchair or bedside chair)?: A Little Help needed to walk in hospital room?: A Little Help needed climbing 3-5 steps with a railing? : A Lot 6 Click Score: 17    End of Session Equipment Utilized During Treatment: Gait belt;Back brace Activity Tolerance: Patient limited by pain;Other (comment) (limited by anxiety) Patient left: in chair;with call bell/phone within reach;with family/visitor present Nurse Communication: Mobility status PT Visit Diagnosis: Unsteadiness on feet (R26.81);Pain Pain - part of body:  (back)     Time: 9924-2683 PT Time Calculation (min) (ACUTE ONLY): 31 min  Charges:  $Gait Training: 8-22 mins                     Benjiman Core, [place] Pager [phone] Acute Rehab   Allena Katz 01/31/2021, 10:28 AM

## 2021-01-31 NOTE — Progress Notes (Signed)
    Subjective: Procedure(s) (LRB): TRANSFORAMINAL LUMBAR INTERBODY FUSION (TLIF) LUMBAR FIVE-SACRAL ONE (N/A) 3 Days Post-Op  Patient reports pain as 3 on 0-10 scale.  Reports decreased leg pain reports incisional back pain   Positive void Positive bowel movement Positive flatus Negative chest pain or shortness of breath  Objective: Vital signs in last 24 hours: Temp:  [98.1 F (36.7 C)-99.8 F (37.7 C)] 98.3 F (36.8 C) (02/19 0414) Pulse Rate:  [72-89] 89 (02/19 0414) Resp:  [18-20] 18 (02/19 0414) BP: (112-136)/(41-60) 135/60 (02/19 0414) SpO2:  [92 %-100 %] 92 % (02/19 0414)  Intake/Output from previous day: No intake/output data recorded.  Labs: No results for input(s): WBC, RBC, HCT, PLT in the last 72 hours. No results for input(s): NA, K, CL, CO2, BUN, CREATININE, GLUCOSE, CALCIUM in the last 72 hours. No results for input(s): LABPT, INR in the last 72 hours.  Physical Exam: Neurologically intact ABD soft Intact pulses distally Incision: dressing C/D/I and no drainage Compartment soft Body mass index is 34.61 kg/m.   Assessment/Plan: Patient stable  xrays n/a Continue mobilization with physical therapy Continue care  Patient was evaluated by therapy yesterday and felt to have some buckling in her knees that would affect her mobilization.  Patient has had total knee replacements in the past and over the last several months she is ambulated less because of her severe back pain.  Collectively this is led to significant deconditioning that is most likely responsible for her slight gait disturbance.  Overall her mobility is improving as is her strength.  She has been set up for home health services through [facility].  We will have her evaluated by therapy again this morning and if she is improved then will discharge to home.  If not then we will transfer to another floor as this unit is closing and determine the next best course of action.  Melina Schools,  MD [facility] [phone]

## 2021-01-31 NOTE — Progress Notes (Signed)
Occupational Therapy Treatment Patient Details Name: Andrea Martinez MRN: [id number] DOB: 08-Feb-1946 Today's Date: 01/31/2021    History of present illness 75 yo female s/p TLIF L5-S1. PMH including bil TKA, Anxiety, Arthritis, Chronic myelogenous leukemia, Hypothyroidism, Lipoma, Mixed hyperlipidemia, Osteoporosis, PE, Urine incontinence, UTI, and Vitamin D deficiency.   OT comments  Upon arrival, pt in recliner with husband at bedside. Pt reporting need to use restroom as she feels she needs to have a BM. Pt requiring Min A for power up from recliner and then becoming very anxious and attempting to sit back despite cues. PTA arriving for +2 assistance and safety. Pt performing functional mobility to bathroom with Min-Mod A +2. While sitting in toilet, pt requiring cues for calm breathing techniques as she would increase her RR and moan loudly. Pt completing peri care with Min Guard A performing sit<>stand without difficulty. Upon return to recliner, pt requiring Mod A +2, RW, and Max cues for safety, coordination, and sequencing. Pt continues to reports she want to dc to home with husband and son. Will require optimized HH services and assistance. Will continue to follow acutely as admitted.    Follow Up Recommendations  Home health OT;Supervision/Assistance - 24 hour    Equipment Recommendations  None recommended by OT    Recommendations for Other Services PT consult    Precautions / Restrictions Precautions Precautions: Back Precaution Booklet Issued: Yes (comment) Precaution Comments: Reviewed handout and pt was cued for precautions Required Braces or Orthoses: Spinal Brace Spinal Brace: Lumbar corset;Applied in sitting position       Mobility Bed Mobility               General bed mobility comments: Pt sitting up in recliner chair upon arrival  Transfers Overall transfer level: Needs assistance Equipment used: Rolling walker (2 wheeled) Transfers: Sit to/from Stand Sit  to Stand: Min guard;Min assist;Mod assist;+2 safety/equipment;+2 physical assistance         General transfer comment: multiple attempts to rise, with pt fearful and sitting back down quickly. She rose several times with min guard to perform peri care, but when rising to ambulate, pt required increased assist, likely due anticipating pain than for any real need for assist. At times she became resistive to standing with significant posterior lean requiring mod A to remain upright.    Balance Overall balance assessment: Needs assistance Sitting-balance support: Feet supported;No upper extremity supported Sitting balance-Leahy Scale: Poor   Postural control: Posterior lean Standing balance support: No upper extremity supported;During functional activity Standing balance-Leahy Scale: Poor Standing balance comment: able to stand with RW and perform peri care                           ADL either performed or assessed with clinical judgement   ADL Overall ADL's : Needs assistance/impaired                         Toilet Transfer: Min guard;Minimal assistance;Moderate assistance;+2 for physical assistance;+2 for safety/equipment;Ambulation;BSC;RW;Grab bars Toilet Transfer Details (indicate cue type and reason): multiple attempts to rise, with pt fearful and sitting back down quickly. She rose several times with min guard to perform peri care, but when rising to ambulate, pt required increased assist, likely due anticipating pain than for any real need for assist. At times she became resistive to standing with significant posterior lean requiring mod A to remain upright.  Functional mobility during ADLs: Moderate assistance;+2 for physical assistance;+2 for safety/equipment;Rolling walker General ADL Comments: Pt very limited this session by pain and anxiety. Pt with poor attention and requiring cues throughout for sustaining attention to task. Pt also becoming more  and more anxious and required cues for taking breaths and calming down. Requiring Min guard A for peri care. Mod A +2 for mobility in room     Vision       Perception     Praxis      Cognition Arousal/Alertness: Lethargic;Suspect due to medications Behavior During Therapy: Anxious Overall Cognitive Status: Impaired/Different from baseline Area of Impairment: Memory;Problem solving;Awareness;Following commands;Safety/judgement;Attention                   Current Attention Level: Sustained Memory: Decreased short-term memory Following Commands: Follows one step commands with increased time;Follows one step commands inconsistently Safety/Judgement: Decreased awareness of safety;Decreased awareness of deficits Awareness: Intellectual;Emergent Problem Solving: Slow processing;Requires verbal cues;Decreased initiation;Difficulty sequencing;Requires tactile cues General Comments: Pt appears very anxious and fearful of mobility this session. She required frequent cues to stay on task. Multiple cues for slowed breathing and calming techniques to reduce anxiety.        Exercises     Shoulder Instructions       General Comments Husband present througout session    Pertinent Vitals/ Pain       Pain Assessment: Faces Faces Pain Scale: Hurts even more Pain Location: back Pain Descriptors / Indicators: Sore;Throbbing;Tender;Grimacing;Constant Pain Intervention(s): Monitored during session;Limited activity within patient's tolerance;Repositioned  Home Living                                          Prior Functioning/Environment              Frequency  Min 2X/week        Progress Toward Goals  OT Goals(current goals can now be found in the care plan section)  Progress towards OT goals: Not progressing toward goals - comment (Increased anxiety)  Acute Rehab OT Goals Patient Stated Goal: Go home OT Goal Formulation: With patient Time For Goal  Achievement: 02/12/21 Potential to Achieve Goals: Good ADL Goals Pt Will Perform Lower Body Dressing: with modified independence;sit to/from stand;with adaptive equipment;with caregiver independent in assisting Pt Will Transfer to Toilet: with modified independence;ambulating;bedside commode Pt Will Perform Toileting - Clothing Manipulation and hygiene: with modified independence;sitting/lateral leans;sit to/from stand Additional ADL Goal #1: Pt will perform bed mobility using log roll technique with Supervision in preparation for ADLs Additional ADL Goal #2: Pt will don/doff back brace with supervision  Plan Discharge plan remains appropriate    Co-evaluation    PT/OT/SLP Co-Evaluation/Treatment: Yes Reason for Co-Treatment: For patient/therapist safety;To address functional/ADL transfers   OT goals addressed during session: ADL's and self-care      AM-PAC OT "6 Clicks" Daily Activity     Outcome Measure   Help from another person eating meals?: None Help from another person taking care of personal grooming?: A Little Help from another person toileting, which includes using toliet, bedpan, or urinal?: A Little Help from another person bathing (including washing, rinsing, drying)?: A Lot Help from another person to put on and taking off regular upper body clothing?: A Little Help from another person to put on and taking off regular lower body clothing?: A Lot 6 Click Score: 17  End of Session Equipment Utilized During Treatment: Rolling walker;Back brace  OT Visit Diagnosis: Unsteadiness on feet (R26.81);Other abnormalities of gait and mobility (R26.89);Muscle weakness (generalized) (M62.81);Pain Pain - part of body:  (Back)   Activity Tolerance Patient limited by fatigue   Patient Left in chair;with call bell/phone within reach;with family/visitor present   Nurse Communication Mobility status        Time: 6922-3009 OT Time Calculation (min): 48 min  Charges: OT  General Charges $OT Visit: 1 Visit OT Treatments $Self Care/Home Management : 23-37 mins  [place], OTR/L Acute Rehab Pager: [phone] Office: [place] 01/31/2021, 2:56 PM

## 2021-02-03 ENCOUNTER — Ambulatory Visit (INDEPENDENT_AMBULATORY_CARE_PROVIDER_SITE_OTHER): Payer: Medicare Other | Admitting: Nurse Practitioner

## 2021-02-03 ENCOUNTER — Encounter: Payer: Self-pay | Admitting: Nurse Practitioner

## 2021-02-03 DIAGNOSIS — J321 Chronic frontal sinusitis: Secondary | ICD-10-CM | POA: Insufficient documentation

## 2021-02-03 MED ORDER — PREDNISONE 10 MG (21) PO TBPK: ORAL_TABLET | ORAL | 0 refills | Status: DC

## 2021-02-03 MED ORDER — SALINE SPRAY 0.65 % NA SOLN: 1.0000 | NASAL | 0 refills | Status: DC | PRN

## 2021-02-03 MED ORDER — AMOXICILLIN-POT CLAVULANATE 875-125 MG PO TABS: 1.0000 | ORAL_TABLET | Freq: Two times a day (BID) | ORAL | 0 refills | Status: DC

## 2021-02-03 NOTE — Progress Notes (Signed)
   Virtual Visit via telephone Note Due to COVID-19 pandemic this visit was conducted virtually. This visit type was conducted due to national recommendations for restrictions regarding the COVID-19 Pandemic (e.g. social distancing, sheltering in place) in an effort to limit this patient's exposure and mitigate transmission in our community. All issues noted in this document were discussed and addressed.  A physical exam was not performed with this format.  I connected with Andrea Martinez on 02/03/21 at  2:50 PM by telephone and verified that I am speaking with the correct person using two identifiers. Andrea Martinez is currently located at home during visit. The provider, Ivy Lynn, NP is located in their office at time of visit.  I discussed the limitations, risks, security and privacy concerns of performing an evaluation and management service by telephone and the availability of in person appointments. I also discussed with the patient that there may be a patient responsible charge related to this service. The patient expressed understanding and agreed to proceed.   History and Present Illness:  Sinusitis This is a new problem. The current episode started in the past 7 days. The problem has been gradually worsening since onset. There has been no fever. The pain is moderate. Associated symptoms include congestion, headaches and sinus pressure. Pertinent negatives include no chills, ear pain or sore throat. Past treatments include nothing.      Review of Systems  Constitutional: Negative for chills.  HENT: Positive for congestion, sinus pressure and sinus pain. Negative for ear pain and sore throat.   Respiratory: Negative.   Cardiovascular: Negative.   Genitourinary: Negative.   Musculoskeletal: Negative.   Skin: Negative.   Neurological: Positive for headaches.  All other systems reviewed and are negative.    Observations/Objective: Televisit-patient did not sound to be in  distress  Assessment and Plan: Sinusitis chronic, frontal Symptoms not well controlled.  Patient is worsening facial pain, facial pressure, and head congestion.  Started patient on amoxicillin, prednisone pack, and nasal spray.  Education provided to patient with printed handouts given, encourage patient to increase hydration, monitor any signs or symptoms fever and follow-up with worsening unresolved symptoms.  Rx sent to pharmacy.   Follow Up Instructions: Follow-up with worsening or unresolved symptoms    I discussed the assessment and treatment plan with the patient. The patient was provided an opportunity to ask questions and all were answered. The patient agreed with the plan and demonstrated an understanding of the instructions.   The patient was advised to call back or seek an in-person evaluation if the symptoms worsen or if the condition fails to improve as anticipated.  The above assessment and management plan was discussed with the patient. The patient verbalized understanding of and has agreed to the management plan. Patient is aware to call the clinic if symptoms persist or worsen. Patient is aware when to return to the clinic for a follow-up visit. Patient educated on when it is appropriate to go to the emergency department.   Time call ended: 257  I provided 7 minutes of non-face-to-face time during this encounter.    Ivy Lynn, NP

## 2021-02-03 NOTE — Assessment & Plan Note (Signed)
Symptoms not well controlled.  Patient is worsening facial pain, facial pressure, and head congestion.  Started patient on amoxicillin, prednisone pack, and nasal spray.  Education provided to patient with printed handouts given, encourage patient to increase hydration, monitor any signs or symptoms fever and follow-up with worsening unresolved symptoms.  Rx sent to pharmacy.

## 2021-02-04 DIAGNOSIS — M810 Age-related osteoporosis without current pathological fracture: Secondary | ICD-10-CM | POA: Diagnosis not present

## 2021-02-04 DIAGNOSIS — Z8781 Personal history of (healed) traumatic fracture: Secondary | ICD-10-CM | POA: Diagnosis not present

## 2021-02-04 DIAGNOSIS — Z4789 Encounter for other orthopedic aftercare: Secondary | ICD-10-CM | POA: Diagnosis not present

## 2021-02-04 DIAGNOSIS — K589 Irritable bowel syndrome without diarrhea: Secondary | ICD-10-CM | POA: Diagnosis not present

## 2021-02-04 DIAGNOSIS — I700 Atherosclerosis of aorta: Secondary | ICD-10-CM | POA: Diagnosis not present

## 2021-02-04 DIAGNOSIS — G8929 Other chronic pain: Secondary | ICD-10-CM | POA: Diagnosis not present

## 2021-02-04 DIAGNOSIS — Z856 Personal history of leukemia: Secondary | ICD-10-CM | POA: Diagnosis not present

## 2021-02-04 DIAGNOSIS — D509 Iron deficiency anemia, unspecified: Secondary | ICD-10-CM | POA: Diagnosis not present

## 2021-02-04 DIAGNOSIS — J321 Chronic frontal sinusitis: Secondary | ICD-10-CM | POA: Diagnosis not present

## 2021-02-04 DIAGNOSIS — M5137 Other intervertebral disc degeneration, lumbosacral region: Secondary | ICD-10-CM | POA: Diagnosis not present

## 2021-02-04 DIAGNOSIS — Z981 Arthrodesis status: Secondary | ICD-10-CM | POA: Diagnosis not present

## 2021-02-04 DIAGNOSIS — Z96653 Presence of artificial knee joint, bilateral: Secondary | ICD-10-CM | POA: Diagnosis not present

## 2021-02-04 DIAGNOSIS — Z87440 Personal history of urinary (tract) infections: Secondary | ICD-10-CM | POA: Diagnosis not present

## 2021-02-04 DIAGNOSIS — Z8711 Personal history of peptic ulcer disease: Secondary | ICD-10-CM | POA: Diagnosis not present

## 2021-02-04 DIAGNOSIS — M5416 Radiculopathy, lumbar region: Secondary | ICD-10-CM | POA: Diagnosis not present

## 2021-02-04 DIAGNOSIS — Z7952 Long term (current) use of systemic steroids: Secondary | ICD-10-CM | POA: Diagnosis not present

## 2021-02-04 DIAGNOSIS — M9973 Connective tissue and disc stenosis of intervertebral foramina of lumbar region: Secondary | ICD-10-CM | POA: Diagnosis not present

## 2021-02-04 DIAGNOSIS — E039 Hypothyroidism, unspecified: Secondary | ICD-10-CM | POA: Diagnosis not present

## 2021-02-04 DIAGNOSIS — N8110 Cystocele, unspecified: Secondary | ICD-10-CM | POA: Diagnosis not present

## 2021-02-04 DIAGNOSIS — E782 Mixed hyperlipidemia: Secondary | ICD-10-CM | POA: Diagnosis not present

## 2021-02-05 NOTE — Discharge Summary (Signed)
Patient ID: Andrea Martinez MRN: [id number] DOB/AGE: Mar 11, 1946 75 y.o.  Admit date: 01/28/2021 Discharge date: 02/05/2021  Admission Diagnoses:  Active Problems:   S/P lumbar fusion   Discharge Diagnoses:  Active Problems:   S/P lumbar fusion  status post Procedure(s): TRANSFORAMINAL LUMBAR INTERBODY FUSION (TLIF) LUMBAR FIVE-SACRAL ONE  Past Medical History:  Diagnosis Date  . Anxiety    denies  . Arthritis   . Chronic myelogenous leukemia (CML), BCR-ABL1-positive ([place]) 11/20/2015  . Gastric ulcer 2011   EGD, 5/11  . History of peptic ulcer disease   . Hypothyroidism    not on meds, followed by Dr. Elyse Hsu  . Lipoma    left upper arm  . Mixed hyperlipidemia   . Osteoporosis   . Pericardial effusion    a. HCAP complicated by pericardial effusion requiring pericardial window 10/2016 and large L pleural effusion requring VATS.  Marland Kitchen Pleural effusion    a. s/p VATS 2017.  Marland Kitchen Pneumonia 10/2016  . PONV (postoperative nausea and vomiting)    history of  . Prolonged QT interval   . Urine incontinence   . UTI (lower urinary tract infection)    frequent  . Vitamin D deficiency     Surgeries: Procedure(s): TRANSFORAMINAL LUMBAR INTERBODY FUSION (TLIF) LUMBAR FIVE-SACRAL ONE on 01/28/2021   Consultants:   Discharged Condition: Improved  Hospital Course: Andrea Martinez is an 75 y.o. female who was admitted 01/28/2021 for operative treatment of Left L5 radiculopathy due to foraminal stenosis and degenerative disc disease L5-S1. Patient failed conservative treatments (please see the history and physical for the specifics) and had severe unremitting pain that affects sleep, daily activities and work/hobbies. After pre-op clearance, the patient was taken to the operating room on 01/28/2021 and underwent  Procedure(s): TRANSFORAMINAL LUMBAR INTERBODY FUSION (TLIF) LUMBAR FIVE-SACRAL ONE.    Patient was given perioperative antibiotics:  Anti-infectives (From admission, onward)    Start     Dose/Rate Route Frequency Ordered Stop   01/28/21 2000  ceFAZolin (ANCEF) IVPB 1 g/50 mL premix        1 g 100 mL/hr over 30 Minutes Intravenous Every 8 hours 01/28/21 1823 01/29/21 0942   01/28/21 1007  ceFAZolin (ANCEF) 2-4 GM/100ML-% IVPB       Note to Pharmacy: Merryl Hacker   : cabinet override      01/28/21 1007 01/28/21 1255   01/28/21 1003  ceFAZolin (ANCEF) IVPB 2g/100 mL premix        2 g 200 mL/hr over 30 Minutes Intravenous 30 min pre-op 01/28/21 1003 01/28/21 1225       Patient was given sequential compression devices and early ambulation to prevent DVT.   Patient benefited maximally from hospital stay and there were no complications. At the time of discharge, the patient was urinating/moving their bowels without difficulty, tolerating a regular diet, pain is controlled with oral pain medications and they have been cleared by PT/OT.   Recent vital signs: No data found.   Recent laboratory studies: No results for input(s): WBC, HGB, HCT, PLT, NA, K, CL, CO2, BUN, CREATININE, GLUCOSE, INR, CALCIUM in the last 72 hours.  Invalid input(s): PT, 2   Discharge Medications:   Allergies as of 01/31/2021      Reactions   Nucynta [tapentadol Hcl] Swelling   Codeine Nausea Only, Other (See Comments)   Headache   Sulfonamide Derivatives Nausea Only, Other (See Comments)   Headache   Esomeprazole Magnesium Nausea Only   Fluconazole Rash   Redness  and blistering on left thigh      Medication List    STOP taking these medications   cetirizine 10 MG tablet Commonly known as: ZYRTEC   HYDROcodone-acetaminophen 10-325 MG tablet Commonly known as: NORCO   multivitamin tablet   PROBIOTIC DIGESTIVE SUPPORT PO   vitamin B-12 1000 MCG tablet Commonly known as: CYANOCOBALAMIN   Vitamin D3 50 MCG (2000 UT) capsule   vitamin E 180 MG (400 UNITS) capsule     TAKE these medications   escitalopram 20 MG tablet Commonly known as: LEXAPRO Take 1.5 tablets (30 mg  total) by mouth daily.   levothyroxine 125 MCG tablet Commonly known as: SYNTHROID Take 1 tablet (125 mcg total) by mouth daily before breakfast.   nilotinib 150 MG capsule Commonly known as: TASIGNA Take 2 capsules (300 mg total) by mouth every 12 (twelve) hours.   ondansetron 4 MG tablet Commonly known as: Zofran Take 1 tablet (4 mg total) by mouth every 8 (eight) hours as needed for nausea or vomiting.     ASK your doctor about these medications   methocarbamol 500 MG tablet Commonly known as: Robaxin Take 1 tablet (500 mg total) by mouth every 8 (eight) hours as needed for up to 5 days for muscle spasms. Ask about: Should I take this medication?   oxyCODONE-acetaminophen 10-325 MG tablet Commonly known as: Percocet Take 1 tablet by mouth every 6 (six) hours as needed for up to 5 days for pain. Ask about: Should I take this medication?       Diagnostic Studies: DG Chest 2 View  Result Date: 01/26/2021 CLINICAL DATA:  Preoperative lumbar fusion.  History of leukemia EXAM: CHEST - 2 VIEW COMPARISON:  February 14, 2019 chest radiograph and chest CT June 14, 2019 FINDINGS: There is mild scarring in the left mid lung and right base regions. There is a questionable nipple shadow on the left. No edema or airspace opacity. Heart size and pulmonary vascularity are normal. No adenopathy. Patient is undergone kyphoplasty procedures in the lower thoracic and upper lumbar regions. IMPRESSION: Questionable nipple shadow on the left. Advise repeat study with nipple markers to confirm. Areas of mild scarring bilaterally. No edema or airspace opacity. Heart upper normal in size. No adenopathy evident. Electronically Signed   By: Lowella Grip III M.D.   On: 01/26/2021 15:37   DG Lumbar Spine 2-3 Views  Result Date: 01/28/2021 CLINICAL DATA:  L5-S1 TLIF. EXAM: DG C-ARM 1-60 MIN; LUMBAR SPINE - 2-3 VIEW FLUOROSCOPY TIME:  Fluoroscopy Time:  2 minutes and 14 seconds. Radiation Exposure Index (if  provided by the fluoroscopic device): 110.82 mGy COMPARISON:  Outside MRI lumbar spine June 18, 21. FINDINGS: Two C-arm fluoroscopic images were obtained intraoperatively and submitted for post operative interpretation. These images demonstrate bilateral posterior pedicle screws and rods at L5-S1 and intervening graft. No unexpected findings. Please see the performing provider's procedural report for further detail. IMPRESSION: Intraoperative fluoroscopy, as detailed above. Electronically Signed   By: Margaretha Sheffield MD   On: 01/28/2021 16:03   DG C-Arm 1-60 Min  Result Date: 01/28/2021 CLINICAL DATA:  L5-S1 TLIF. EXAM: DG C-ARM 1-60 MIN; LUMBAR SPINE - 2-3 VIEW FLUOROSCOPY TIME:  Fluoroscopy Time:  2 minutes and 14 seconds. Radiation Exposure Index (if provided by the fluoroscopic device): 110.82 mGy COMPARISON:  Outside MRI lumbar spine June 18, 21. FINDINGS: Two C-arm fluoroscopic images were obtained intraoperatively and submitted for post operative interpretation. These images demonstrate bilateral posterior pedicle screws and rods  at L5-S1 and intervening graft. No unexpected findings. Please see the performing provider's procedural report for further detail. IMPRESSION: Intraoperative fluoroscopy, as detailed above. Electronically Signed   By: Margaretha Sheffield MD   On: 01/28/2021 16:03    Discharge Instructions    Incentive spirometry RT   Complete by: As directed        Follow-up Information    Melina Schools, MD. Schedule an appointment as soon as possible for a visit in 2 weeks.   Specialty: Orthopedic Surgery Why: If symptoms worsen, For suture removal, For wound re-check Contact information: [address] [place] [address] [phone]        [facility] Follow up.   Specialty: Rehabilitation Why: Please find out from your doctor when to schedule your first physical therapy appointment. Contact information: [place] [id number] [place] [phone]       [facility] Follow up.   Specialty: Home Health Services Why: [facility] will contact you on Monday to schedule your first home visit.  Contact information: [address] [place] [address] [phone]               Discharge Plan:  discharge to home  Disposition: stable    Signed: Yvonne Kendall Ward for [facility] PA-C [facility] [phone] 02/05/2021, 4:34 PM

## 2021-02-16 DIAGNOSIS — Z8711 Personal history of peptic ulcer disease: Secondary | ICD-10-CM | POA: Diagnosis not present

## 2021-02-16 DIAGNOSIS — E039 Hypothyroidism, unspecified: Secondary | ICD-10-CM | POA: Diagnosis not present

## 2021-02-16 DIAGNOSIS — M5416 Radiculopathy, lumbar region: Secondary | ICD-10-CM | POA: Diagnosis not present

## 2021-02-16 DIAGNOSIS — J321 Chronic frontal sinusitis: Secondary | ICD-10-CM | POA: Diagnosis not present

## 2021-02-16 DIAGNOSIS — N8110 Cystocele, unspecified: Secondary | ICD-10-CM | POA: Diagnosis not present

## 2021-02-16 DIAGNOSIS — Z87440 Personal history of urinary (tract) infections: Secondary | ICD-10-CM | POA: Diagnosis not present

## 2021-02-16 DIAGNOSIS — Z856 Personal history of leukemia: Secondary | ICD-10-CM | POA: Diagnosis not present

## 2021-02-16 DIAGNOSIS — E782 Mixed hyperlipidemia: Secondary | ICD-10-CM | POA: Diagnosis not present

## 2021-02-16 DIAGNOSIS — Z7952 Long term (current) use of systemic steroids: Secondary | ICD-10-CM | POA: Diagnosis not present

## 2021-02-16 DIAGNOSIS — D509 Iron deficiency anemia, unspecified: Secondary | ICD-10-CM | POA: Diagnosis not present

## 2021-02-16 DIAGNOSIS — I700 Atherosclerosis of aorta: Secondary | ICD-10-CM | POA: Diagnosis not present

## 2021-02-16 DIAGNOSIS — Z4789 Encounter for other orthopedic aftercare: Secondary | ICD-10-CM | POA: Diagnosis not present

## 2021-02-16 DIAGNOSIS — Z96653 Presence of artificial knee joint, bilateral: Secondary | ICD-10-CM | POA: Diagnosis not present

## 2021-02-16 DIAGNOSIS — G8929 Other chronic pain: Secondary | ICD-10-CM | POA: Diagnosis not present

## 2021-02-16 DIAGNOSIS — K589 Irritable bowel syndrome without diarrhea: Secondary | ICD-10-CM | POA: Diagnosis not present

## 2021-02-16 DIAGNOSIS — M5137 Other intervertebral disc degeneration, lumbosacral region: Secondary | ICD-10-CM | POA: Diagnosis not present

## 2021-02-16 DIAGNOSIS — M9973 Connective tissue and disc stenosis of intervertebral foramina of lumbar region: Secondary | ICD-10-CM | POA: Diagnosis not present

## 2021-02-16 DIAGNOSIS — M810 Age-related osteoporosis without current pathological fracture: Secondary | ICD-10-CM | POA: Diagnosis not present

## 2021-02-16 DIAGNOSIS — Z8781 Personal history of (healed) traumatic fracture: Secondary | ICD-10-CM | POA: Diagnosis not present

## 2021-02-16 DIAGNOSIS — Z981 Arthrodesis status: Secondary | ICD-10-CM | POA: Diagnosis not present

## 2021-02-17 DIAGNOSIS — Z856 Personal history of leukemia: Secondary | ICD-10-CM | POA: Diagnosis not present

## 2021-02-17 DIAGNOSIS — Z8711 Personal history of peptic ulcer disease: Secondary | ICD-10-CM | POA: Diagnosis not present

## 2021-02-17 DIAGNOSIS — Z4789 Encounter for other orthopedic aftercare: Secondary | ICD-10-CM | POA: Diagnosis not present

## 2021-02-17 DIAGNOSIS — K589 Irritable bowel syndrome without diarrhea: Secondary | ICD-10-CM | POA: Diagnosis not present

## 2021-02-17 DIAGNOSIS — G8929 Other chronic pain: Secondary | ICD-10-CM | POA: Diagnosis not present

## 2021-02-17 DIAGNOSIS — N8110 Cystocele, unspecified: Secondary | ICD-10-CM | POA: Diagnosis not present

## 2021-02-17 DIAGNOSIS — I700 Atherosclerosis of aorta: Secondary | ICD-10-CM | POA: Diagnosis not present

## 2021-02-17 DIAGNOSIS — M9973 Connective tissue and disc stenosis of intervertebral foramina of lumbar region: Secondary | ICD-10-CM | POA: Diagnosis not present

## 2021-02-17 DIAGNOSIS — M5416 Radiculopathy, lumbar region: Secondary | ICD-10-CM | POA: Diagnosis not present

## 2021-02-17 DIAGNOSIS — Z8781 Personal history of (healed) traumatic fracture: Secondary | ICD-10-CM | POA: Diagnosis not present

## 2021-02-17 DIAGNOSIS — E039 Hypothyroidism, unspecified: Secondary | ICD-10-CM | POA: Diagnosis not present

## 2021-02-17 DIAGNOSIS — Z96653 Presence of artificial knee joint, bilateral: Secondary | ICD-10-CM | POA: Diagnosis not present

## 2021-02-17 DIAGNOSIS — Z87440 Personal history of urinary (tract) infections: Secondary | ICD-10-CM | POA: Diagnosis not present

## 2021-02-17 DIAGNOSIS — M810 Age-related osteoporosis without current pathological fracture: Secondary | ICD-10-CM | POA: Diagnosis not present

## 2021-02-17 DIAGNOSIS — M5137 Other intervertebral disc degeneration, lumbosacral region: Secondary | ICD-10-CM | POA: Diagnosis not present

## 2021-02-17 DIAGNOSIS — Z7952 Long term (current) use of systemic steroids: Secondary | ICD-10-CM | POA: Diagnosis not present

## 2021-02-17 DIAGNOSIS — J321 Chronic frontal sinusitis: Secondary | ICD-10-CM | POA: Diagnosis not present

## 2021-02-17 DIAGNOSIS — D509 Iron deficiency anemia, unspecified: Secondary | ICD-10-CM | POA: Diagnosis not present

## 2021-02-17 DIAGNOSIS — Z981 Arthrodesis status: Secondary | ICD-10-CM | POA: Diagnosis not present

## 2021-02-17 DIAGNOSIS — E782 Mixed hyperlipidemia: Secondary | ICD-10-CM | POA: Diagnosis not present

## 2021-02-20 DIAGNOSIS — Z7952 Long term (current) use of systemic steroids: Secondary | ICD-10-CM | POA: Diagnosis not present

## 2021-02-20 DIAGNOSIS — Z87440 Personal history of urinary (tract) infections: Secondary | ICD-10-CM | POA: Diagnosis not present

## 2021-02-20 DIAGNOSIS — Z981 Arthrodesis status: Secondary | ICD-10-CM | POA: Diagnosis not present

## 2021-02-20 DIAGNOSIS — I700 Atherosclerosis of aorta: Secondary | ICD-10-CM | POA: Diagnosis not present

## 2021-02-20 DIAGNOSIS — G8929 Other chronic pain: Secondary | ICD-10-CM | POA: Diagnosis not present

## 2021-02-20 DIAGNOSIS — M5137 Other intervertebral disc degeneration, lumbosacral region: Secondary | ICD-10-CM | POA: Diagnosis not present

## 2021-02-20 DIAGNOSIS — Z8711 Personal history of peptic ulcer disease: Secondary | ICD-10-CM | POA: Diagnosis not present

## 2021-02-20 DIAGNOSIS — Z8781 Personal history of (healed) traumatic fracture: Secondary | ICD-10-CM | POA: Diagnosis not present

## 2021-02-20 DIAGNOSIS — Z96653 Presence of artificial knee joint, bilateral: Secondary | ICD-10-CM | POA: Diagnosis not present

## 2021-02-20 DIAGNOSIS — M5416 Radiculopathy, lumbar region: Secondary | ICD-10-CM | POA: Diagnosis not present

## 2021-02-20 DIAGNOSIS — J321 Chronic frontal sinusitis: Secondary | ICD-10-CM | POA: Diagnosis not present

## 2021-02-20 DIAGNOSIS — D509 Iron deficiency anemia, unspecified: Secondary | ICD-10-CM | POA: Diagnosis not present

## 2021-02-20 DIAGNOSIS — E039 Hypothyroidism, unspecified: Secondary | ICD-10-CM | POA: Diagnosis not present

## 2021-02-20 DIAGNOSIS — M810 Age-related osteoporosis without current pathological fracture: Secondary | ICD-10-CM | POA: Diagnosis not present

## 2021-02-20 DIAGNOSIS — E782 Mixed hyperlipidemia: Secondary | ICD-10-CM | POA: Diagnosis not present

## 2021-02-20 DIAGNOSIS — N8110 Cystocele, unspecified: Secondary | ICD-10-CM | POA: Diagnosis not present

## 2021-02-20 DIAGNOSIS — K589 Irritable bowel syndrome without diarrhea: Secondary | ICD-10-CM | POA: Diagnosis not present

## 2021-02-20 DIAGNOSIS — M9973 Connective tissue and disc stenosis of intervertebral foramina of lumbar region: Secondary | ICD-10-CM | POA: Diagnosis not present

## 2021-02-20 DIAGNOSIS — Z4789 Encounter for other orthopedic aftercare: Secondary | ICD-10-CM | POA: Diagnosis not present

## 2021-02-20 DIAGNOSIS — Z856 Personal history of leukemia: Secondary | ICD-10-CM | POA: Diagnosis not present

## 2021-02-23 ENCOUNTER — Other Ambulatory Visit: Payer: Self-pay

## 2021-02-23 ENCOUNTER — Telehealth: Payer: Self-pay

## 2021-02-23 ENCOUNTER — Ambulatory Visit (INDEPENDENT_AMBULATORY_CARE_PROVIDER_SITE_OTHER): Payer: Medicare Other | Admitting: Nurse Practitioner

## 2021-02-23 DIAGNOSIS — Z008 Encounter for other general examination: Secondary | ICD-10-CM

## 2021-02-23 NOTE — Telephone Encounter (Signed)
Appointment scheduled.

## 2021-02-23 NOTE — Telephone Encounter (Signed)
Pt called stating that she had back surgery about 3 wks ago and believes that the medications she has been taking since having her surgery has caused irritation with her stomach because she has been having awful cramps and diarrhea. Pt says she has been taking Imodium AD and Mylanta, and staying hydrated, but nothing is working. Pt says it is so bad that she cant even sleep.  Wants a provider (ANY PROVIDER) to advise her on what to do ASAP.

## 2021-02-24 ENCOUNTER — Encounter: Payer: Self-pay | Admitting: Nurse Practitioner

## 2021-02-24 DIAGNOSIS — Z5329 Procedure and treatment not carried out because of patient's decision for other reasons: Secondary | ICD-10-CM | POA: Insufficient documentation

## 2021-02-24 DIAGNOSIS — Z91199 Patient's noncompliance with other medical treatment and regimen due to unspecified reason: Secondary | ICD-10-CM | POA: Insufficient documentation

## 2021-02-24 NOTE — Progress Notes (Signed)
6 attempts made to reach patient by phone but was unable to.  Voice messages left on landline and cell phone kept ringing busy.

## 2021-02-26 DIAGNOSIS — Z87440 Personal history of urinary (tract) infections: Secondary | ICD-10-CM | POA: Diagnosis not present

## 2021-02-26 DIAGNOSIS — Z4789 Encounter for other orthopedic aftercare: Secondary | ICD-10-CM | POA: Diagnosis not present

## 2021-02-26 DIAGNOSIS — Z8781 Personal history of (healed) traumatic fracture: Secondary | ICD-10-CM | POA: Diagnosis not present

## 2021-02-26 DIAGNOSIS — M5137 Other intervertebral disc degeneration, lumbosacral region: Secondary | ICD-10-CM | POA: Diagnosis not present

## 2021-02-26 DIAGNOSIS — Z8711 Personal history of peptic ulcer disease: Secondary | ICD-10-CM | POA: Diagnosis not present

## 2021-02-26 DIAGNOSIS — M810 Age-related osteoporosis without current pathological fracture: Secondary | ICD-10-CM | POA: Diagnosis not present

## 2021-02-26 DIAGNOSIS — Z7952 Long term (current) use of systemic steroids: Secondary | ICD-10-CM | POA: Diagnosis not present

## 2021-02-26 DIAGNOSIS — Z981 Arthrodesis status: Secondary | ICD-10-CM | POA: Diagnosis not present

## 2021-02-26 DIAGNOSIS — M9973 Connective tissue and disc stenosis of intervertebral foramina of lumbar region: Secondary | ICD-10-CM | POA: Diagnosis not present

## 2021-02-26 DIAGNOSIS — M5416 Radiculopathy, lumbar region: Secondary | ICD-10-CM | POA: Diagnosis not present

## 2021-02-26 DIAGNOSIS — E782 Mixed hyperlipidemia: Secondary | ICD-10-CM | POA: Diagnosis not present

## 2021-02-26 DIAGNOSIS — Z856 Personal history of leukemia: Secondary | ICD-10-CM | POA: Diagnosis not present

## 2021-02-26 DIAGNOSIS — J321 Chronic frontal sinusitis: Secondary | ICD-10-CM | POA: Diagnosis not present

## 2021-02-26 DIAGNOSIS — N8110 Cystocele, unspecified: Secondary | ICD-10-CM | POA: Diagnosis not present

## 2021-02-26 DIAGNOSIS — E039 Hypothyroidism, unspecified: Secondary | ICD-10-CM | POA: Diagnosis not present

## 2021-02-26 DIAGNOSIS — Z96653 Presence of artificial knee joint, bilateral: Secondary | ICD-10-CM | POA: Diagnosis not present

## 2021-02-26 DIAGNOSIS — K589 Irritable bowel syndrome without diarrhea: Secondary | ICD-10-CM | POA: Diagnosis not present

## 2021-02-26 DIAGNOSIS — I700 Atherosclerosis of aorta: Secondary | ICD-10-CM | POA: Diagnosis not present

## 2021-02-26 DIAGNOSIS — G8929 Other chronic pain: Secondary | ICD-10-CM | POA: Diagnosis not present

## 2021-02-26 DIAGNOSIS — D509 Iron deficiency anemia, unspecified: Secondary | ICD-10-CM | POA: Diagnosis not present

## 2021-02-27 DIAGNOSIS — Z7952 Long term (current) use of systemic steroids: Secondary | ICD-10-CM | POA: Diagnosis not present

## 2021-02-27 DIAGNOSIS — Z856 Personal history of leukemia: Secondary | ICD-10-CM | POA: Diagnosis not present

## 2021-02-27 DIAGNOSIS — D509 Iron deficiency anemia, unspecified: Secondary | ICD-10-CM | POA: Diagnosis not present

## 2021-02-27 DIAGNOSIS — M9973 Connective tissue and disc stenosis of intervertebral foramina of lumbar region: Secondary | ICD-10-CM | POA: Diagnosis not present

## 2021-02-27 DIAGNOSIS — G8929 Other chronic pain: Secondary | ICD-10-CM | POA: Diagnosis not present

## 2021-02-27 DIAGNOSIS — K589 Irritable bowel syndrome without diarrhea: Secondary | ICD-10-CM | POA: Diagnosis not present

## 2021-02-27 DIAGNOSIS — I700 Atherosclerosis of aorta: Secondary | ICD-10-CM | POA: Diagnosis not present

## 2021-02-27 DIAGNOSIS — Z87440 Personal history of urinary (tract) infections: Secondary | ICD-10-CM | POA: Diagnosis not present

## 2021-02-27 DIAGNOSIS — M810 Age-related osteoporosis without current pathological fracture: Secondary | ICD-10-CM | POA: Diagnosis not present

## 2021-02-27 DIAGNOSIS — E039 Hypothyroidism, unspecified: Secondary | ICD-10-CM | POA: Diagnosis not present

## 2021-02-27 DIAGNOSIS — N8110 Cystocele, unspecified: Secondary | ICD-10-CM | POA: Diagnosis not present

## 2021-02-27 DIAGNOSIS — Z8781 Personal history of (healed) traumatic fracture: Secondary | ICD-10-CM | POA: Diagnosis not present

## 2021-02-27 DIAGNOSIS — M5137 Other intervertebral disc degeneration, lumbosacral region: Secondary | ICD-10-CM | POA: Diagnosis not present

## 2021-02-27 DIAGNOSIS — Z8711 Personal history of peptic ulcer disease: Secondary | ICD-10-CM | POA: Diagnosis not present

## 2021-02-27 DIAGNOSIS — M5416 Radiculopathy, lumbar region: Secondary | ICD-10-CM | POA: Diagnosis not present

## 2021-02-27 DIAGNOSIS — Z4789 Encounter for other orthopedic aftercare: Secondary | ICD-10-CM | POA: Diagnosis not present

## 2021-02-27 DIAGNOSIS — E782 Mixed hyperlipidemia: Secondary | ICD-10-CM | POA: Diagnosis not present

## 2021-02-27 DIAGNOSIS — Z981 Arthrodesis status: Secondary | ICD-10-CM | POA: Diagnosis not present

## 2021-02-27 DIAGNOSIS — J321 Chronic frontal sinusitis: Secondary | ICD-10-CM | POA: Diagnosis not present

## 2021-02-27 DIAGNOSIS — Z96653 Presence of artificial knee joint, bilateral: Secondary | ICD-10-CM | POA: Diagnosis not present

## 2021-03-02 DIAGNOSIS — Z8711 Personal history of peptic ulcer disease: Secondary | ICD-10-CM | POA: Diagnosis not present

## 2021-03-02 DIAGNOSIS — Z981 Arthrodesis status: Secondary | ICD-10-CM | POA: Diagnosis not present

## 2021-03-02 DIAGNOSIS — M5416 Radiculopathy, lumbar region: Secondary | ICD-10-CM | POA: Diagnosis not present

## 2021-03-02 DIAGNOSIS — J321 Chronic frontal sinusitis: Secondary | ICD-10-CM | POA: Diagnosis not present

## 2021-03-02 DIAGNOSIS — E782 Mixed hyperlipidemia: Secondary | ICD-10-CM | POA: Diagnosis not present

## 2021-03-02 DIAGNOSIS — Z856 Personal history of leukemia: Secondary | ICD-10-CM | POA: Diagnosis not present

## 2021-03-02 DIAGNOSIS — E039 Hypothyroidism, unspecified: Secondary | ICD-10-CM | POA: Diagnosis not present

## 2021-03-02 DIAGNOSIS — M810 Age-related osteoporosis without current pathological fracture: Secondary | ICD-10-CM | POA: Diagnosis not present

## 2021-03-02 DIAGNOSIS — I700 Atherosclerosis of aorta: Secondary | ICD-10-CM | POA: Diagnosis not present

## 2021-03-02 DIAGNOSIS — Z7952 Long term (current) use of systemic steroids: Secondary | ICD-10-CM | POA: Diagnosis not present

## 2021-03-02 DIAGNOSIS — Z8781 Personal history of (healed) traumatic fracture: Secondary | ICD-10-CM | POA: Diagnosis not present

## 2021-03-02 DIAGNOSIS — D509 Iron deficiency anemia, unspecified: Secondary | ICD-10-CM | POA: Diagnosis not present

## 2021-03-02 DIAGNOSIS — K589 Irritable bowel syndrome without diarrhea: Secondary | ICD-10-CM | POA: Diagnosis not present

## 2021-03-02 DIAGNOSIS — N8110 Cystocele, unspecified: Secondary | ICD-10-CM | POA: Diagnosis not present

## 2021-03-02 DIAGNOSIS — M9973 Connective tissue and disc stenosis of intervertebral foramina of lumbar region: Secondary | ICD-10-CM | POA: Diagnosis not present

## 2021-03-02 DIAGNOSIS — Z87440 Personal history of urinary (tract) infections: Secondary | ICD-10-CM | POA: Diagnosis not present

## 2021-03-02 DIAGNOSIS — G8929 Other chronic pain: Secondary | ICD-10-CM | POA: Diagnosis not present

## 2021-03-02 DIAGNOSIS — Z96653 Presence of artificial knee joint, bilateral: Secondary | ICD-10-CM | POA: Diagnosis not present

## 2021-03-02 DIAGNOSIS — Z4789 Encounter for other orthopedic aftercare: Secondary | ICD-10-CM | POA: Diagnosis not present

## 2021-03-02 DIAGNOSIS — M5137 Other intervertebral disc degeneration, lumbosacral region: Secondary | ICD-10-CM | POA: Diagnosis not present

## 2021-03-03 DIAGNOSIS — Z981 Arthrodesis status: Secondary | ICD-10-CM | POA: Diagnosis not present

## 2021-03-03 DIAGNOSIS — E782 Mixed hyperlipidemia: Secondary | ICD-10-CM | POA: Diagnosis not present

## 2021-03-03 DIAGNOSIS — Z4789 Encounter for other orthopedic aftercare: Secondary | ICD-10-CM | POA: Diagnosis not present

## 2021-03-03 DIAGNOSIS — I700 Atherosclerosis of aorta: Secondary | ICD-10-CM | POA: Diagnosis not present

## 2021-03-03 DIAGNOSIS — Z856 Personal history of leukemia: Secondary | ICD-10-CM | POA: Diagnosis not present

## 2021-03-03 DIAGNOSIS — Z8711 Personal history of peptic ulcer disease: Secondary | ICD-10-CM | POA: Diagnosis not present

## 2021-03-03 DIAGNOSIS — M5416 Radiculopathy, lumbar region: Secondary | ICD-10-CM | POA: Diagnosis not present

## 2021-03-03 DIAGNOSIS — J321 Chronic frontal sinusitis: Secondary | ICD-10-CM | POA: Diagnosis not present

## 2021-03-03 DIAGNOSIS — M5137 Other intervertebral disc degeneration, lumbosacral region: Secondary | ICD-10-CM | POA: Diagnosis not present

## 2021-03-03 DIAGNOSIS — Z96653 Presence of artificial knee joint, bilateral: Secondary | ICD-10-CM | POA: Diagnosis not present

## 2021-03-03 DIAGNOSIS — Z8781 Personal history of (healed) traumatic fracture: Secondary | ICD-10-CM | POA: Diagnosis not present

## 2021-03-03 DIAGNOSIS — M810 Age-related osteoporosis without current pathological fracture: Secondary | ICD-10-CM | POA: Diagnosis not present

## 2021-03-03 DIAGNOSIS — D509 Iron deficiency anemia, unspecified: Secondary | ICD-10-CM | POA: Diagnosis not present

## 2021-03-03 DIAGNOSIS — Z87440 Personal history of urinary (tract) infections: Secondary | ICD-10-CM | POA: Diagnosis not present

## 2021-03-03 DIAGNOSIS — Z7952 Long term (current) use of systemic steroids: Secondary | ICD-10-CM | POA: Diagnosis not present

## 2021-03-03 DIAGNOSIS — K589 Irritable bowel syndrome without diarrhea: Secondary | ICD-10-CM | POA: Diagnosis not present

## 2021-03-03 DIAGNOSIS — N8110 Cystocele, unspecified: Secondary | ICD-10-CM | POA: Diagnosis not present

## 2021-03-03 DIAGNOSIS — E039 Hypothyroidism, unspecified: Secondary | ICD-10-CM | POA: Diagnosis not present

## 2021-03-03 DIAGNOSIS — G8929 Other chronic pain: Secondary | ICD-10-CM | POA: Diagnosis not present

## 2021-03-03 DIAGNOSIS — M9973 Connective tissue and disc stenosis of intervertebral foramina of lumbar region: Secondary | ICD-10-CM | POA: Diagnosis not present

## 2021-03-04 DIAGNOSIS — Z8781 Personal history of (healed) traumatic fracture: Secondary | ICD-10-CM | POA: Diagnosis not present

## 2021-03-04 DIAGNOSIS — Z7952 Long term (current) use of systemic steroids: Secondary | ICD-10-CM | POA: Diagnosis not present

## 2021-03-04 DIAGNOSIS — Z981 Arthrodesis status: Secondary | ICD-10-CM | POA: Diagnosis not present

## 2021-03-04 DIAGNOSIS — D509 Iron deficiency anemia, unspecified: Secondary | ICD-10-CM | POA: Diagnosis not present

## 2021-03-04 DIAGNOSIS — I700 Atherosclerosis of aorta: Secondary | ICD-10-CM | POA: Diagnosis not present

## 2021-03-04 DIAGNOSIS — G8929 Other chronic pain: Secondary | ICD-10-CM | POA: Diagnosis not present

## 2021-03-04 DIAGNOSIS — J321 Chronic frontal sinusitis: Secondary | ICD-10-CM | POA: Diagnosis not present

## 2021-03-04 DIAGNOSIS — K589 Irritable bowel syndrome without diarrhea: Secondary | ICD-10-CM | POA: Diagnosis not present

## 2021-03-04 DIAGNOSIS — Z4789 Encounter for other orthopedic aftercare: Secondary | ICD-10-CM | POA: Diagnosis not present

## 2021-03-04 DIAGNOSIS — M5416 Radiculopathy, lumbar region: Secondary | ICD-10-CM | POA: Diagnosis not present

## 2021-03-04 DIAGNOSIS — E039 Hypothyroidism, unspecified: Secondary | ICD-10-CM | POA: Diagnosis not present

## 2021-03-04 DIAGNOSIS — M9973 Connective tissue and disc stenosis of intervertebral foramina of lumbar region: Secondary | ICD-10-CM | POA: Diagnosis not present

## 2021-03-04 DIAGNOSIS — Z87440 Personal history of urinary (tract) infections: Secondary | ICD-10-CM | POA: Diagnosis not present

## 2021-03-04 DIAGNOSIS — Z856 Personal history of leukemia: Secondary | ICD-10-CM | POA: Diagnosis not present

## 2021-03-04 DIAGNOSIS — M5137 Other intervertebral disc degeneration, lumbosacral region: Secondary | ICD-10-CM | POA: Diagnosis not present

## 2021-03-04 DIAGNOSIS — E782 Mixed hyperlipidemia: Secondary | ICD-10-CM | POA: Diagnosis not present

## 2021-03-04 DIAGNOSIS — N8110 Cystocele, unspecified: Secondary | ICD-10-CM | POA: Diagnosis not present

## 2021-03-04 DIAGNOSIS — Z96653 Presence of artificial knee joint, bilateral: Secondary | ICD-10-CM | POA: Diagnosis not present

## 2021-03-04 DIAGNOSIS — M810 Age-related osteoporosis without current pathological fracture: Secondary | ICD-10-CM | POA: Diagnosis not present

## 2021-03-04 DIAGNOSIS — Z8711 Personal history of peptic ulcer disease: Secondary | ICD-10-CM | POA: Diagnosis not present

## 2021-03-06 DIAGNOSIS — Z7952 Long term (current) use of systemic steroids: Secondary | ICD-10-CM | POA: Diagnosis not present

## 2021-03-06 DIAGNOSIS — M810 Age-related osteoporosis without current pathological fracture: Secondary | ICD-10-CM | POA: Diagnosis not present

## 2021-03-06 DIAGNOSIS — M5416 Radiculopathy, lumbar region: Secondary | ICD-10-CM | POA: Diagnosis not present

## 2021-03-06 DIAGNOSIS — Z856 Personal history of leukemia: Secondary | ICD-10-CM | POA: Diagnosis not present

## 2021-03-06 DIAGNOSIS — G8929 Other chronic pain: Secondary | ICD-10-CM | POA: Diagnosis not present

## 2021-03-06 DIAGNOSIS — M5137 Other intervertebral disc degeneration, lumbosacral region: Secondary | ICD-10-CM | POA: Diagnosis not present

## 2021-03-06 DIAGNOSIS — Z96653 Presence of artificial knee joint, bilateral: Secondary | ICD-10-CM | POA: Diagnosis not present

## 2021-03-06 DIAGNOSIS — Z8711 Personal history of peptic ulcer disease: Secondary | ICD-10-CM | POA: Diagnosis not present

## 2021-03-06 DIAGNOSIS — Z4789 Encounter for other orthopedic aftercare: Secondary | ICD-10-CM | POA: Diagnosis not present

## 2021-03-06 DIAGNOSIS — N8110 Cystocele, unspecified: Secondary | ICD-10-CM | POA: Diagnosis not present

## 2021-03-06 DIAGNOSIS — Z8781 Personal history of (healed) traumatic fracture: Secondary | ICD-10-CM | POA: Diagnosis not present

## 2021-03-06 DIAGNOSIS — E782 Mixed hyperlipidemia: Secondary | ICD-10-CM | POA: Diagnosis not present

## 2021-03-06 DIAGNOSIS — Z981 Arthrodesis status: Secondary | ICD-10-CM | POA: Diagnosis not present

## 2021-03-06 DIAGNOSIS — M9973 Connective tissue and disc stenosis of intervertebral foramina of lumbar region: Secondary | ICD-10-CM | POA: Diagnosis not present

## 2021-03-06 DIAGNOSIS — J321 Chronic frontal sinusitis: Secondary | ICD-10-CM | POA: Diagnosis not present

## 2021-03-06 DIAGNOSIS — Z87440 Personal history of urinary (tract) infections: Secondary | ICD-10-CM | POA: Diagnosis not present

## 2021-03-06 DIAGNOSIS — I700 Atherosclerosis of aorta: Secondary | ICD-10-CM | POA: Diagnosis not present

## 2021-03-06 DIAGNOSIS — E039 Hypothyroidism, unspecified: Secondary | ICD-10-CM | POA: Diagnosis not present

## 2021-03-06 DIAGNOSIS — D509 Iron deficiency anemia, unspecified: Secondary | ICD-10-CM | POA: Diagnosis not present

## 2021-03-06 DIAGNOSIS — K589 Irritable bowel syndrome without diarrhea: Secondary | ICD-10-CM | POA: Diagnosis not present

## 2021-03-10 ENCOUNTER — Telehealth: Payer: Self-pay | Admitting: Family Medicine

## 2021-03-10 NOTE — Telephone Encounter (Signed)
Pt has been having diarrhea for three weeks. Offered appt for tomorrow but she would like to speak to someone today to see what she can do for it.

## 2021-03-11 ENCOUNTER — Encounter: Payer: Self-pay | Admitting: Family Medicine

## 2021-03-11 ENCOUNTER — Ambulatory Visit: Payer: Medicare Other | Admitting: Physical Therapy

## 2021-03-11 ENCOUNTER — Ambulatory Visit (INDEPENDENT_AMBULATORY_CARE_PROVIDER_SITE_OTHER): Payer: Medicare Other | Admitting: Family Medicine

## 2021-03-11 DIAGNOSIS — K580 Irritable bowel syndrome with diarrhea: Secondary | ICD-10-CM

## 2021-03-11 MED ORDER — DICYCLOMINE HCL 10 MG PO CAPS: 10.0000 mg | ORAL_CAPSULE | Freq: Three times a day (TID) | ORAL | 2 refills | Status: DC

## 2021-03-11 NOTE — Progress Notes (Signed)
Virtual Visit via Telephone Note  I connected with Andrea Martinez on 03/11/21 at 10:51 AM by telephone and verified that I am speaking with the correct person using two identifiers. Andrea Martinez is currently located at home and her husband is currently with her during this visit. The provider, Loman Brooklyn, FNP is located in their home at time of visit.  I discussed the limitations, risks, security and privacy concerns of performing an evaluation and management service by telephone and the availability of in person appointments. I also discussed with the patient that there may be a patient responsible charge related to this service. The patient expressed understanding and agreed to proceed.  Subjective: PCP: Loman Brooklyn, FNP  Chief Complaint  Patient presents with  . Diarrhea   Diarrhea: Patient complains of diarrhea. Onset of diarrhea was a little over 2 weeks ago. Diarrhea is occurring approximately 3 times per day. Patient describes diarrhea as loose, not watery. Diarrhea has been associated with abdominal pain described as ringing and twisting and recent antibiotic therapy last month (Augmentin).  Patient denies blood in stool, fever, illness in household contacts, recent camping, recent travel.  Previous visits for diarrhea: none. Evaluation to date: none. Treatment to date: Imodium-AD and Mylanta.   ROS: Per HPI  Current Outpatient Medications:  .  escitalopram (LEXAPRO) 20 MG tablet, Take 1.5 tablets (30 mg total) by mouth daily., Disp: 135 tablet, Rfl: 1 .  levothyroxine (SYNTHROID) 125 MCG tablet, Take 1 tablet (125 mcg total) by mouth daily before breakfast., Disp: 90 tablet, Rfl: 3 .  nilotinib (TASIGNA) 150 MG capsule, Take 2 capsules (300 mg total) by mouth every 12 (twelve) hours., Disp: 120 capsule, Rfl: 6 .  ondansetron (ZOFRAN) 4 MG tablet, Take 1 tablet (4 mg total) by mouth every 8 (eight) hours as needed for nausea or vomiting., Disp: 20 tablet, Rfl: 0 .  sodium  chloride (OCEAN) 0.65 % SOLN nasal spray, Place 1 spray into both nostrils as needed for congestion., Disp: 30 mL, Rfl: 0  Allergies  Allergen Reactions  . Nucynta [Tapentadol Hcl] Swelling  . Codeine Nausea Only and Other (See Comments)    Headache  . Sulfonamide Derivatives Nausea Only and Other (See Comments)    Headache  . Esomeprazole Magnesium Nausea Only  . Fluconazole Rash    Redness and blistering on left thigh   Past Medical History:  Diagnosis Date  . Anxiety    denies  . Arthritis   . Chronic myelogenous leukemia (CML), BCR-ABL1-positive ([place]) 11/20/2015  . Gastric ulcer 2011   EGD, 5/11  . History of peptic ulcer disease   . Hypothyroidism    not on meds, followed by Dr. Elyse Hsu  . Lipoma    left upper arm  . Mixed hyperlipidemia   . Osteoporosis   . Pericardial effusion    a. HCAP complicated by pericardial effusion requiring pericardial window 10/2016 and large L pleural effusion requring VATS.  Marland Kitchen Pleural effusion    a. s/p VATS 2017.  Marland Kitchen Pneumonia 10/2016  . PONV (postoperative nausea and vomiting)    history of  . Prolonged QT interval   . Urine incontinence   . UTI (lower urinary tract infection)    frequent  . Vitamin D deficiency     Observations/Objective: A&O  No respiratory distress or wheezing audible over the phone Mood, judgement, and thought processes all WNL   Assessment and Plan: 1. Irritable bowel syndrome with diarrhea Continue Imodium-AD as needed.  -  dicyclomine (BENTYL) 10 MG capsule; Take 1 capsule (10 mg total) by mouth 4 (four) times daily -  before meals and at bedtime.  Dispense: 120 capsule; Refill: 2   Follow Up Instructions:  I discussed the assessment and treatment plan with the patient. The patient was provided an opportunity to ask questions and all were answered. The patient agreed with the plan and demonstrated an understanding of the instructions.   The patient was advised to call back or seek an in-person  evaluation if the symptoms worsen or if the condition fails to improve as anticipated.  The above assessment and management plan was discussed with the patient. The patient verbalized understanding of and has agreed to the management plan. Patient is aware to call the clinic if symptoms persist or worsen. Patient is aware when to return to the clinic for a follow-up visit. Patient educated on when it is appropriate to go to the emergency department.   Time call ended: 11:02 AM  I provided 11 minutes of non-face-to-face time during this encounter.  Hendricks Limes, MSN, APRN, FNP-C [facility] 03/11/21

## 2021-03-13 NOTE — Telephone Encounter (Signed)
Left message to call back  

## 2021-03-16 ENCOUNTER — Ambulatory Visit: Payer: Medicare Other | Admitting: Physical Therapy

## 2021-03-18 NOTE — Telephone Encounter (Signed)
Left message to call back  

## 2021-03-20 DIAGNOSIS — Z4889 Encounter for other specified surgical aftercare: Secondary | ICD-10-CM | POA: Diagnosis not present

## 2021-03-23 ENCOUNTER — Ambulatory Visit: Payer: Medicare Other | Attending: Orthopedic Surgery | Admitting: Physical Therapy

## 2021-03-23 ENCOUNTER — Other Ambulatory Visit: Payer: Self-pay

## 2021-03-23 DIAGNOSIS — Z981 Arthrodesis status: Secondary | ICD-10-CM | POA: Diagnosis not present

## 2021-03-23 DIAGNOSIS — M545 Low back pain, unspecified: Secondary | ICD-10-CM | POA: Insufficient documentation

## 2021-03-23 DIAGNOSIS — G8929 Other chronic pain: Secondary | ICD-10-CM | POA: Diagnosis not present

## 2021-03-23 DIAGNOSIS — M47896 Other spondylosis, lumbar region: Secondary | ICD-10-CM | POA: Insufficient documentation

## 2021-03-23 NOTE — Therapy (Signed)
[facility] [place], [place], [address] Phone: [phone]   Fax:  [phone]  Physical Therapy Evaluation  Patient Details  Name: Andrea Martinez MRN: [id number] Date of Birth: 1946/04/12 Referring Provider (PT): Melina Schools MD   Encounter Date: 03/23/2021   PT End of Session - 03/23/21 1302    Visit Number 1    Number of Visits 12    Date for PT Re-Evaluation 04/20/21    Authorization Type FOTO AT LEAST EVERY 5TH VISIT.  PROGRESS NOTE AT 10TH VISIT.  KX MODIFIER AFTER 15 VISITS.    PT Start Time 1115    PT Stop Time 1157    PT Time Calculation (min) 42 min    Activity Tolerance Patient tolerated treatment well    Behavior During Therapy WFL for tasks assessed/performed           Past Medical History:  Diagnosis Date  . Anxiety    denies  . Arthritis   . Chronic myelogenous leukemia (CML), BCR-ABL1-positive ([place]) 11/20/2015  . Gastric ulcer 2011   EGD, 5/11  . History of peptic ulcer disease   . Hypothyroidism    not on meds, followed by Dr. Elyse Hsu  . Lipoma    left upper arm  . Mixed hyperlipidemia   . Osteoporosis   . Pericardial effusion    a. HCAP complicated by pericardial effusion requiring pericardial window 10/2016 and large L pleural effusion requring VATS.  Marland Kitchen Pleural effusion    a. s/p VATS 2017.  Marland Kitchen Pneumonia 10/2016  . PONV (postoperative nausea and vomiting)    history of  . Prolonged QT interval   . Urine incontinence   . UTI (lower urinary tract infection)    frequent  . Vitamin D deficiency     Past Surgical History:  Procedure Laterality Date  . BRAVO [place] STUDY  12/04/2012   Procedure: BRAVO [place];  Surgeon: [name], MD;  [facility];  Service: Endoscopy;  Laterality: N/A;  . CHOLECYSTECTOMY N/A 01/03/2014   Procedure: LAPAROSCOPIC CHOLECYSTECTOMY WITH INTRAOPERATIVE CHOLANGIOGRAM;  Surgeon: Joyice Faster. Cornett, MD;  Location: [place];   Service: General;  Laterality: N/A;  . COLONOSCOPY  01/2004   DR [facility], few small tics  . ESOPHAGOGASTRODUODENOSCOPY  02/2010   gastric ulcers  . ESOPHAGOGASTRODUODENOSCOPY  12/04/2012   [id number] gastritis (inflammation) was found in the gastric antrum; multiple biopsies The duodenal mucosa showed no abnormalities in the bulb and second portion of the duodenum  . JOINT REPLACEMENT Right 10/22/2019  . KYPHOPLASTY N/A 02/14/2019   Procedure: KYPHOPLASTY T12 and L1;  Surgeon: Melina Schools, MD;  Location: [place];  Service: Orthopedics;  Laterality: N/A;  120 mins  . LIPOMA EXCISION  08/02/2011   left shoulder  . NOSE SURGERY    . PARTIAL HYSTERECTOMY     vaginal at age 17 years of age  . TOE DEBRIDEMENT Left 1962   lt great toe  . TOTAL KNEE ARTHROPLASTY Right 10/22/2019   Procedure: TOTAL KNEE ARTHROPLASTY;  Surgeon: Gaynelle Arabian, MD;  [facility];  Service: Orthopedics;  Laterality: Right;  42min  . TOTAL KNEE ARTHROPLASTY Left 03/03/2020   Procedure: TOTAL KNEE ARTHROPLASTY;  Surgeon: Gaynelle Arabian, MD;  [facility];  Service: Orthopedics;  Laterality: Left;  49min  . TRANSFORAMINAL LUMBAR INTERBODY FUSION (TLIF) WITH PEDICLE SCREW FIXATION 1 LEVEL N/A 01/28/2021   Procedure: TRANSFORAMINAL LUMBAR INTERBODY FUSION (TLIF) LUMBAR FIVE-SACRAL ONE;  Surgeon: Melina Schools,  MD;  Location: [facility];  Service: Orthopedics;  Laterality: N/A;  4 hrs  . VIDEO ASSISTED THORACOSCOPY (VATS)/EMPYEMA Left 10/25/2016   Procedure: VIDEO ASSISTED THORACOSCOPY (VATS), BRONCH,DRAINAGE OF PLEURAL EFFUSION,PERICARDIAL WINDOW WITH DRAINAGE OF PERICARDIAL FLUID, TEE;  Surgeon: [name], MD;  Location: [facility];  Service: Thoracic;  Laterality: Left;  Marland Kitchen VIDEO BRONCHOSCOPY N/A 10/25/2016   Procedure: VIDEO BRONCHOSCOPY;  Surgeon: [name], MD;  Location: [facility];  Service: Thoracic;  Laterality: N/A;    There were no vitals filed for this visit.    Subjective Assessment - 03/23/21  1254    Subjective COVID-19 screen performed prior to patient entering clinic.  The patient presents to the clinic today s/p TLIF L5-S1 performed on 01/28/21.  She is very pleased with her progress thus far and rates her pain a a 3 today.  She presented to the clinic today walking without an assisitve device.  Moving and rest decrease her pain and and moving too much increase her pain.    Pertinent History Bilateral TKA's, OP, bilateral TKA's, leukemia, kyphoplasty.    How long can you sit comfortably? Varies.    How long can you stand comfortably? Varies.    How long can you walk comfortably? Short community distance.    Patient Stated Goals Get around better without pain.    Currently in Pain? Yes    Pain Score 3     Pain Location Back    Pain Orientation Mid;Right;Left    Pain Descriptors / Indicators Aching    Pain Type Surgical pain    Pain Onset More than a month ago    Pain Frequency Constant    Aggravating Factors  See above.    Pain Relieving Factors See above.              [facility] PT Assessment - 03/23/21 0001      Assessment   Medical Diagnosis TFIL L5-S1.    Referring Provider (PT) Melina Schools MD    Onset Date/Surgical Date --   01/28/21 (surgery date).     Precautions   Precaution Comments Per lumbar fusion protocol.      Restrictions   Weight Bearing Restrictions No      Balance Screen   Has the patient fallen in the past 6 months No    Has the patient had a decrease in activity level because of a fear of falling?  No    Is the patient reluctant to leave their home because of a fear of falling?  No      Home Environment   Living Environment Private residence      Observation/Other Assessments   Observations Lumbar incisional sites lookk to be healing very well.      Posture/Postural Control   Posture/Postural Control No significant limitations      ROM / Strength   AROM / PROM / Strength AROM;Strength      AROM   Overall AROM Comments WFL for  bilateral LE's.      Strength   Overall Strength Comments Bilateral knee and ankle are essentailly normal.      Palpation   Palpation comment Generalized minimal palpable tenderness over lower lumbar region.      Ambulation/Gait   Gait Comments Slow and purposeful without assistive device.                      Objective measurements completed on examination: See above findings.  [facility] Adult PT Treatment/Exercise - 03/23/21 0001      Modalities   Modalities Electrical Stimulation;Moist Heat      Moist Heat Therapy   Number Minutes Moist Heat 15 Minutes    Moist Heat Location Lumbar Spine      Electrical Stimulation   Electrical Stimulation Location LB    Electrical Stimulation Action IFC at 80-150 Hz.    Electrical Stimulation Parameters 40% scan x 15 minutes.    Electrical Stimulation Goals Pain                       PT Long Term Goals - 03/23/21 1403      PT LONG TERM GOAL #1   Title Ind with an HEP.    Time 4    Period Weeks    Status New      PT LONG TERM GOAL #2   Title Walk a community distance with pain not > 3/10.    Time 4    Period Weeks    Status New      PT LONG TERM GOAL #3   Title Perform ADL's with pain not > 3/10.    Time 4    Period Weeks    Status New                  Plan - 03/23/21 1354    Clinical Impression Statement The patient presents to OPPT s/o TLIF L5-S1 performed on 01/28/21.  She is very pleased with her progress and was walking without assisitve device.  She exhibits essentially normal bilateral knee and ankle strength.  Her LE range of motion is functional.  She has some remains tenderness in her bilateral lower lumbar region but is is minimal.  Patient will benefit from skilled physical therapy intervention to address pain and deficits.    Personal Factors and Comorbidities Comorbidity 1;Comorbidity 2    Comorbidities Bilateral TKA's, OP, bilateral TKA's, leukemia, kyphoplasty.     Examination-Activity Limitations Other    Examination-Participation Restrictions Other    Stability/Clinical Decision Making Stable/Uncomplicated    Clinical Decision Making Low    Rehab Potential Excellent    PT Frequency 2x / week    PT Duration 6 weeks    PT Treatment/Interventions ADLs/Self Care Home Management;Cryotherapy;Electrical Stimulation;Moist Heat;Functional mobility training;Therapeutic activities;Therapeutic exercise;Manual techniques;Patient/family education;Passive range of motion    PT Next Visit Plan Proceed per lumbar fusion protocol.  STW/M as needed.    Consulted and Agree with Plan of Care Patient           Patient will benefit from skilled therapeutic intervention in order to improve the following deficits and impairments:  Pain,Decreased activity tolerance,Increased muscle spasms  Visit Diagnosis: Chronic bilateral low back pain without sciatica     Problem List Patient Active Problem List   Diagnosis Date Noted  . No-show for appointment 02/24/2021  . Sinusitis chronic, frontal 02/03/2021  . S/P lumbar fusion 01/28/2021  . Mixed hyperlipidemia   . Atherosclerosis of aorta ([place]) 09/14/2020  . Anxiety 09/14/2020  . Chronic low back pain 09/14/2020  . Thoracic compression fracture ([place]) 02/14/2019  . Sweating abnormality 10/11/2017  . Iron deficiency anemia 02/16/2017  . Solitary pulmonary nodule 10/23/2016  . Bladder prolapse, female, acquired 11/21/2015  . IBS (irritable bowel syndrome) 11/21/2015  . Neutrophilic leukocytosis 24/08/7352  . OA (osteoarthritis) of knee 11/21/2015  . CML (chronic myeloid leukemia) ([place]) 11/20/2015  . Dyspepsia 11/30/2012  . Hypothyroidism 02/11/2010  Janiel Derhammer, [place] MPT 03/23/2021, 2:06 PM  [facility] [address] [place], [place], [address] Phone: [phone]   Fax:  [phone]  Name: Andrea Martinez MRN: [id number] Date of Birth: 16-Mar-1946

## 2021-03-23 NOTE — Addendum Note (Signed)
Addended by: Freedom Peddy, [place] W on: 03/23/2021 02:08 PM   Modules accepted: Orders

## 2021-03-25 ENCOUNTER — Other Ambulatory Visit: Payer: Self-pay

## 2021-03-25 ENCOUNTER — Ambulatory Visit: Payer: Medicare Other | Admitting: Physical Therapy

## 2021-03-25 DIAGNOSIS — G8929 Other chronic pain: Secondary | ICD-10-CM | POA: Diagnosis not present

## 2021-03-25 DIAGNOSIS — M545 Low back pain, unspecified: Secondary | ICD-10-CM | POA: Diagnosis not present

## 2021-03-25 DIAGNOSIS — M47896 Other spondylosis, lumbar region: Secondary | ICD-10-CM | POA: Diagnosis not present

## 2021-03-25 DIAGNOSIS — Z981 Arthrodesis status: Secondary | ICD-10-CM | POA: Diagnosis not present

## 2021-03-25 NOTE — Therapy (Signed)
[facility] [place], [place], [address] Phone: [phone]   Fax:  [phone]  Physical Therapy Treatment  Patient Details  Name: Andrea Martinez MRN: [id number] Date of Birth: 1946-08-29 Referring Provider (PT): Melina Schools MD   Encounter Date: 03/25/2021   PT End of Session - 03/25/21 1500    Visit Number 2    Number of Visits 12    Date for PT Re-Evaluation 04/20/21    Authorization Type FOTO AT LEAST EVERY 5TH VISIT.  PROGRESS NOTE AT 10TH VISIT.  KX MODIFIER AFTER 15 VISITS.    PT Start Time 0230    PT Stop Time 0312    PT Time Calculation (min) 42 min    Activity Tolerance Patient tolerated treatment well    Behavior During Therapy WFL for tasks assessed/performed           Past Medical History:  Diagnosis Date  . Anxiety    denies  . Arthritis   . Chronic myelogenous leukemia (CML), BCR-ABL1-positive ([place]) 11/20/2015  . Gastric ulcer 2011   EGD, 5/11  . History of peptic ulcer disease   . Hypothyroidism    not on meds, followed by Dr. Elyse Hsu  . Lipoma    left upper arm  . Mixed hyperlipidemia   . Osteoporosis   . Pericardial effusion    a. HCAP complicated by pericardial effusion requiring pericardial window 10/2016 and large L pleural effusion requring VATS.  Marland Kitchen Pleural effusion    a. s/p VATS 2017.  Marland Kitchen Pneumonia 10/2016  . PONV (postoperative nausea and vomiting)    history of  . Prolonged QT interval   . Urine incontinence   . UTI (lower urinary tract infection)    frequent  . Vitamin D deficiency     Past Surgical History:  Procedure Laterality Date  . BRAVO [place] STUDY  12/04/2012   Procedure: BRAVO [place]  Surgeon: [name], MD;  [facility];  Service: Endoscopy;  Laterality: N/A;  . CHOLECYSTECTOMY N/A 01/03/2014   Procedure: LAPAROSCOPIC CHOLECYSTECTOMY WITH INTRAOPERATIVE CHOLANGIOGRAM;  Surgeon: Joyice Faster. Cornett, MD;  Location: [place] [facility];   Service: General;  Laterality: N/A;  . COLONOSCOPY  01/2004   DR [facility], few small tics  . ESOPHAGOGASTRODUODENOSCOPY  02/2010   gastric ulcers  . ESOPHAGOGASTRODUODENOSCOPY  12/04/2012   [id number] gastritis (inflammation) was found in the gastric antrum; multiple biopsies The duodenal mucosa showed no abnormalities in the bulb and second portion of the duodenum  . JOINT REPLACEMENT Right 10/22/2019  . KYPHOPLASTY N/A 02/14/2019   Procedure: KYPHOPLASTY T12 and L1;  Surgeon: Melina Schools, MD;  Location: [place];  Service: Orthopedics;  Laterality: N/A;  120 mins  . LIPOMA EXCISION  08/02/2011   left shoulder  . NOSE SURGERY    . PARTIAL HYSTERECTOMY     vaginal at age 52 years of age  . TOE DEBRIDEMENT Left 1962   lt great toe  . TOTAL KNEE ARTHROPLASTY Right 10/22/2019   Procedure: TOTAL KNEE ARTHROPLASTY;  Surgeon: Gaynelle Arabian, MD;  [facility];  Service: Orthopedics;  Laterality: Right;  8min  . TOTAL KNEE ARTHROPLASTY Left 03/03/2020   Procedure: TOTAL KNEE ARTHROPLASTY;  Surgeon: Gaynelle Arabian, MD;  [facility];  Service: Orthopedics;  Laterality: Left;  83min  . TRANSFORAMINAL LUMBAR INTERBODY FUSION (TLIF) WITH PEDICLE SCREW FIXATION 1 LEVEL N/A 01/28/2021   Procedure: TRANSFORAMINAL LUMBAR INTERBODY FUSION (TLIF) LUMBAR FIVE-SACRAL ONE;  Surgeon: Melina Schools,  MD;  Location: [place] [facility];  Service: Orthopedics;  Laterality: N/A;  4 hrs  . VIDEO ASSISTED THORACOSCOPY (VATS)/EMPYEMA Left 10/25/2016   Procedure: VIDEO ASSISTED THORACOSCOPY (VATS), BRONCH,DRAINAGE OF PLEURAL EFFUSION,PERICARDIAL WINDOW WITH DRAINAGE OF PERICARDIAL FLUID, TEE;  Surgeon: [name], MD;  Location: [place];  Service: Thoracic;  Laterality: Left;  Marland Kitchen VIDEO BRONCHOSCOPY N/A 10/25/2016   Procedure: VIDEO BRONCHOSCOPY;  Surgeon: [name], MD;  Location: [facility];  Service: Thoracic;  Laterality: N/A;    There were no vitals filed for this visit.   Subjective Assessment - 03/25/21 1435     Subjective COVID-19 screen performed prior to patient entering clinic. Patient arrived with minimal discomfort    Pertinent History Bilateral TKA's, OP, bilateral TKA's, leukemia, kyphoplasty.    How long can you sit comfortably? Varies.    How long can you stand comfortably? Varies.    How long can you walk comfortably? Short community distance.    Currently in Pain? Yes    Pain Score 3     Pain Location Back    Pain Orientation Right;Left    Pain Descriptors / Indicators Discomfort    Pain Type Surgical pain    Pain Onset More than a month ago    Pain Frequency Constant    Aggravating Factors  prolong activity    Pain Relieving Factors rest                             [facility] Adult PT Treatment/Exercise - 03/25/21 0001      Exercises   Exercises Lumbar;Knee/Hip      Lumbar Exercises: Aerobic   Nustep L2 x74min      Lumbar Exercises: Standing   Heel Raises 20 reps    Other Standing Lumbar Exercises standing on airex balance x1min then tandem step stance 10sec x4      Lumbar Exercises: Seated   Other Seated Lumbar Exercises seated abdominal brace with 2# bicep curl alternating, then flexion to 90 2x10 each    Other Seated Lumbar Exercises core sets with red swiss 3sec x20      Knee/Hip Exercises: Seated   Ball Squeeze 3sec x20      Moist Heat Therapy   Number Minutes Moist Heat 15 Minutes    Moist Heat Location Lumbar Spine      Electrical Stimulation   Electrical Stimulation Location LB    Electrical Stimulation Action IFC 80-150hz  x48min    Electrical Stimulation Parameters 40% scan    Electrical Stimulation Goals Pain                       PT Long Term Goals - 03/25/21 1437      PT LONG TERM GOAL #1   Title Ind with an HEP.    Time 4    Period Weeks    Status On-going      PT LONG TERM GOAL #2   Title Walk a community distance with pain not > 3/10.    Time 4    Period Weeks    Status On-going      PT LONG TERM GOAL #3    Title Perform ADL's with pain not > 3/10.    Time 4    Period Weeks    Status On-going                 Plan - 03/25/21 1501    Clinical  Impression Statement Patient tolerated treatment well today. Patient reported doing standing hip abd, flex, ext at home. Today focused on progression per protocol and will progress per tolerance. Patient reported no fatigue and no increased pain today. Patient goals progressing.    Personal Factors and Comorbidities Comorbidity 1;Comorbidity 2    Comorbidities Bilateral TKA's, OP, bilateral TKA's, leukemia, kyphoplasty.    Examination-Activity Limitations Other    Examination-Participation Restrictions Other    Stability/Clinical Decision Making Stable/Uncomplicated    Rehab Potential Excellent    PT Frequency 2x / week    PT Duration 6 weeks    PT Treatment/Interventions ADLs/Self Care Home Management;Cryotherapy;Electrical Stimulation;Moist Heat;Functional mobility training;Therapeutic activities;Therapeutic exercise;Manual techniques;Patient/family education;Passive range of motion    PT Next Visit Plan Proceed per lumbar fusion protocol.  STW/M as needed.    Consulted and Agree with Plan of Care Patient           Patient will benefit from skilled therapeutic intervention in order to improve the following deficits and impairments:  Pain,Decreased activity tolerance,Increased muscle spasms  Visit Diagnosis: Chronic bilateral low back pain without sciatica     Problem List Patient Active Problem List   Diagnosis Date Noted  . No-show for appointment 02/24/2021  . Sinusitis chronic, frontal 02/03/2021  . S/P lumbar fusion 01/28/2021  . Mixed hyperlipidemia   . Atherosclerosis of aorta ([place]) 09/14/2020  . Anxiety 09/14/2020  . Chronic low back pain 09/14/2020  . Thoracic compression fracture ([place]) 02/14/2019  . Sweating abnormality 10/11/2017  . Iron deficiency anemia 02/16/2017  . Solitary pulmonary nodule 10/23/2016  . Bladder  prolapse, female, acquired 11/21/2015  . IBS (irritable bowel syndrome) 11/21/2015  . Neutrophilic leukocytosis 49/75/3005  . OA (osteoarthritis) of knee 11/21/2015  . CML (chronic myeloid leukemia) ([place]) 11/20/2015  . Dyspepsia 11/30/2012  . Hypothyroidism 02/11/2010    Phillips Climes, PTA 03/25/2021, 3:17 PM  [facility] [place], [place], [address] Phone: [phone]   Fax:  [phone]  Name: Andrea Martinez MRN: [id number] Date of Birth: Jan 03, 1946

## 2021-04-01 ENCOUNTER — Ambulatory Visit: Payer: Medicare Other | Admitting: Physical Therapy

## 2021-04-01 ENCOUNTER — Encounter: Payer: Self-pay | Admitting: Physical Therapy

## 2021-04-01 ENCOUNTER — Other Ambulatory Visit: Payer: Self-pay

## 2021-04-01 DIAGNOSIS — M47896 Other spondylosis, lumbar region: Secondary | ICD-10-CM | POA: Diagnosis not present

## 2021-04-01 DIAGNOSIS — M545 Low back pain, unspecified: Secondary | ICD-10-CM | POA: Diagnosis not present

## 2021-04-01 DIAGNOSIS — G8929 Other chronic pain: Secondary | ICD-10-CM | POA: Diagnosis not present

## 2021-04-01 DIAGNOSIS — Z981 Arthrodesis status: Secondary | ICD-10-CM | POA: Diagnosis not present

## 2021-04-01 NOTE — Therapy (Signed)
[facility] [place], [place], [address] Phone: [phone]   Fax:  [phone]  Physical Therapy Treatment  Patient Details  Name: Andrea Martinez MRN: [id number] Date of Birth: Mar 14, 1946 Referring Provider (PT): Melina Schools MD   Encounter Date: 04/01/2021   PT End of Session - 04/01/21 1501    Visit Number 3    Number of Visits 12    Date for PT Re-Evaluation 04/20/21    Authorization Type FOTO AT LEAST EVERY 5TH VISIT.  PROGRESS NOTE AT 10TH VISIT.  KX MODIFIER AFTER 15 VISITS.    PT Start Time 0145    PT Stop Time 0239    PT Time Calculation (min) 54 min    Activity Tolerance Patient tolerated treatment well    Behavior During Therapy WFL for tasks assessed/performed           Past Medical History:  Diagnosis Date  . Anxiety    denies  . Arthritis   . Chronic myelogenous leukemia (CML), BCR-ABL1-positive ([place]) 11/20/2015  . Gastric ulcer 2011   EGD, 5/11  . History of peptic ulcer disease   . Hypothyroidism    not on meds, followed by Dr. Elyse Hsu  . Lipoma    left upper arm  . Mixed hyperlipidemia   . Osteoporosis   . Pericardial effusion    a. HCAP complicated by pericardial effusion requiring pericardial window 10/2016 and large L pleural effusion requring VATS.  Marland Kitchen Pleural effusion    a. s/p VATS 2017.  Marland Kitchen Pneumonia 10/2016  . PONV (postoperative nausea and vomiting)    history of  . Prolonged QT interval   . Urine incontinence   . UTI (lower urinary tract infection)    frequent  . Vitamin D deficiency     Past Surgical History:  Procedure Laterality Date  . BRAVO [place] STUDY  12/04/2012   Procedure: BRAVO [place];  Surgeon: [name], MD;  [facility];  Service: Endoscopy;  Laterality: N/A;  . CHOLECYSTECTOMY N/A 01/03/2014   Procedure: LAPAROSCOPIC CHOLECYSTECTOMY WITH INTRAOPERATIVE CHOLANGIOGRAM;  Surgeon: Joyice Faster. Cornett, MD;  Location: [place];   Service: General;  Laterality: N/A;  . COLONOSCOPY  01/2004   DR [facility], few small tics  . ESOPHAGOGASTRODUODENOSCOPY  02/2010   gastric ulcers  . ESOPHAGOGASTRODUODENOSCOPY  12/04/2012   [id number] gastritis (inflammation) was found in the gastric antrum; multiple biopsies The duodenal mucosa showed no abnormalities in the bulb and second portion of the duodenum  . JOINT REPLACEMENT Right 10/22/2019  . KYPHOPLASTY N/A 02/14/2019   Procedure: KYPHOPLASTY T12 and L1;  Surgeon: Melina Schools, MD;  Location: [place];  Service: Orthopedics;  Laterality: N/A;  120 mins  . LIPOMA EXCISION  08/02/2011   left shoulder  . NOSE SURGERY    . PARTIAL HYSTERECTOMY     vaginal at age 24 years of age  . TOE DEBRIDEMENT Left 1962   lt great toe  . TOTAL KNEE ARTHROPLASTY Right 10/22/2019   Procedure: TOTAL KNEE ARTHROPLASTY;  Surgeon: Gaynelle Arabian, MD;  [facility];  Service: Orthopedics;  Laterality: Right;  73min  . TOTAL KNEE ARTHROPLASTY Left 03/03/2020   Procedure: TOTAL KNEE ARTHROPLASTY;  Surgeon: Gaynelle Arabian, MD;  [facility];  Service: Orthopedics;  Laterality: Left;  58min  . TRANSFORAMINAL LUMBAR INTERBODY FUSION (TLIF) WITH PEDICLE SCREW FIXATION 1 LEVEL N/A 01/28/2021   Procedure: TRANSFORAMINAL LUMBAR INTERBODY FUSION (TLIF) LUMBAR FIVE-SACRAL ONE;  Surgeon: Melina Schools,  MD;  Location: [place];  Service: Orthopedics;  Laterality: N/A;  4 hrs  . VIDEO ASSISTED THORACOSCOPY (VATS)/EMPYEMA Left 10/25/2016   Procedure: VIDEO ASSISTED THORACOSCOPY (VATS), BRONCH,DRAINAGE OF PLEURAL EFFUSION,PERICARDIAL WINDOW WITH DRAINAGE OF PERICARDIAL FLUID, TEE;  Surgeon: [name], MD;  Location: [place];  Service: Thoracic;  Laterality: Left;  Marland Kitchen VIDEO BRONCHOSCOPY N/A 10/25/2016   Procedure: VIDEO BRONCHOSCOPY;  Surgeon: [name], MD;  Location: [place];  Service: Thoracic;  Laterality: N/A;    There were no vitals filed for this visit.   Subjective Assessment - 04/01/21 1501     Subjective COVID-19 screen performed prior to patient entering clinic.  No new complaints.    Pertinent History Bilateral TKA's, OP, bilateral TKA's, leukemia, kyphoplasty.    How long can you sit comfortably? Varies.    How long can you stand comfortably? Varies.    Patient Stated Goals Get around better without pain.    Currently in Pain? Yes    Pain Score 3                              [facility] Adult PT Treatment/Exercise - 04/01/21 0001      Exercises   Exercises Knee/Hip      Lumbar Exercises: Aerobic   Nustep Level 2 x 11 minutes.      Modalities   Modalities Moist Heat;Electrical Stimulation      Moist Heat Therapy   Number Minutes Moist Heat 20 Minutes    Moist Heat Location Lumbar Spine      Electrical Stimulation   Electrical Stimulation Location LB    Electrical Stimulation Action IFC at 80-150 Hz    Electrical Stimulation Parameters 40% scan x 20 minutes.    Electrical Stimulation Goals Pain;Tone      Manual Therapy   Manual Therapy Soft tissue mobilization    Manual therapy comments STW/M x 12 minutes to patient's lower lumbar region.                       PT Long Term Goals - 03/25/21 1437      PT LONG TERM GOAL #1   Title Ind with an HEP.    Time 4    Period Weeks    Status On-going      PT LONG TERM GOAL #2   Title Walk a community distance with pain not > 3/10.    Time 4    Period Weeks    Status On-going      PT LONG TERM GOAL #3   Title Perform ADL's with pain not > 3/10.    Time 4    Period Weeks    Status On-going                 Plan - 04/01/21 1504    Clinical Impression Statement Patient tolerated treatment very well today.  She states that STW/M was very effective today.    Personal Factors and Comorbidities Comorbidity 1;Comorbidity 2    Comorbidities Bilateral TKA's, OP, bilateral TKA's, leukemia, kyphoplasty.    Examination-Activity Limitations Other    Examination-Participation  Restrictions Other    Stability/Clinical Decision Making Stable/Uncomplicated    Rehab Potential Excellent    PT Frequency 2x / week    PT Duration 6 weeks    PT Treatment/Interventions ADLs/Self Care Home Management;Cryotherapy;Electrical Stimulation;Moist Heat;Functional mobility training;Therapeutic activities;Therapeutic exercise;Manual techniques;Patient/family education;Passive range of motion  PT Next Visit Plan Proceed per lumbar fusion protocol.  STW/M as needed.    Consulted and Agree with Plan of Care Patient           Patient will benefit from skilled therapeutic intervention in order to improve the following deficits and impairments:  Pain,Decreased activity tolerance,Increased muscle spasms  Visit Diagnosis: Chronic bilateral low back pain without sciatica     Problem List Patient Active Problem List   Diagnosis Date Noted  . No-show for appointment 02/24/2021  . Sinusitis chronic, frontal 02/03/2021  . S/P lumbar fusion 01/28/2021  . Mixed hyperlipidemia   . Atherosclerosis of aorta ([place]) 09/14/2020  . Anxiety 09/14/2020  . Chronic low back pain 09/14/2020  . Thoracic compression fracture ([place]) 02/14/2019  . Sweating abnormality 10/11/2017  . Iron deficiency anemia 02/16/2017  . Solitary pulmonary nodule 10/23/2016  . Bladder prolapse, female, acquired 11/21/2015  . IBS (irritable bowel syndrome) 11/21/2015  . Neutrophilic leukocytosis 25/00/3704  . OA (osteoarthritis) of knee 11/21/2015  . CML (chronic myeloid leukemia) ([place]) 11/20/2015  . Dyspepsia 11/30/2012  . Hypothyroidism 02/11/2010    Tarvis Blossom, [place] MPT 04/01/2021, 3:06 PM  [facility] [address] [place], [place], [address] Phone: [phone]   Fax:  [phone]  Name: Andrea Martinez MRN: [id number] Date of Birth: 1946/10/12

## 2021-04-03 ENCOUNTER — Other Ambulatory Visit: Payer: Self-pay

## 2021-04-03 ENCOUNTER — Ambulatory Visit: Payer: Medicare Other | Admitting: Physical Therapy

## 2021-04-03 DIAGNOSIS — M545 Low back pain, unspecified: Secondary | ICD-10-CM | POA: Diagnosis not present

## 2021-04-03 DIAGNOSIS — Z981 Arthrodesis status: Secondary | ICD-10-CM | POA: Diagnosis not present

## 2021-04-03 DIAGNOSIS — G8929 Other chronic pain: Secondary | ICD-10-CM

## 2021-04-03 DIAGNOSIS — M47896 Other spondylosis, lumbar region: Secondary | ICD-10-CM | POA: Diagnosis not present

## 2021-04-03 NOTE — Therapy (Signed)
[facility] [address] [place], [place], [address] Phone: [phone]   Fax:  [phone]  Physical Therapy Treatment  Patient Details  Name: Andrea Martinez MRN: [id number] Date of Birth: [birth year] Referring Provider (PT): [name] MD   Encounter Date: 04/03/2021   PT End of Session - 04/03/21 1201    Visit Number 4    Number of Visits 12    Date for PT Re-Evaluation 04/20/21    Authorization Type FOTO AT LEAST EVERY 5TH VISIT.  PROGRESS NOTE AT 10TH VISIT.  KX MODIFIER AFTER 15 VISITS.    PT Start Time 1030    PT Stop Time 1122    PT Time Calculation (min) 52 min    Activity Tolerance Patient tolerated treatment well    Behavior During Therapy WFL for tasks assessed/performed           Past Medical History:  Diagnosis Date  . Anxiety    denies  . Arthritis   . Chronic myelogenous leukemia (CML), BCR-ABL1-positive ([facility]) 11/20/2015  . Gastric ulcer 2011   EGD, 5/11  . History of peptic ulcer disease   . Hypothyroidism    not on meds, followed by Dr. [name]  . Lipoma    left upper arm  . Mixed hyperlipidemia   . Osteoporosis   . Pericardial effusion    a. HCAP complicated by pericardial effusion requiring pericardial window 10/2016 and large L pleural effusion requring VATS.  Marland Kitchen Pleural effusion    a. s/p VATS 2017.  Marland Kitchen Pneumonia 10/2016  . PONV (postoperative nausea and vomiting)    history of  . Prolonged QT interval   . Urine incontinence   . UTI (lower urinary tract infection)    frequent  . Vitamin D deficiency     Past Surgical History:  Procedure Laterality Date  . BRAVO [place] STUDY  12/04/2012   Procedure: BRAVO [place] STUDY;  Surgeon: [place] Bali, MD;  [facility];  Service: Endoscopy;  Laterality: N/A;  . CHOLECYSTECTOMY N/A 01/03/2014   Procedure: LAPAROSCOPIC CHOLECYSTECTOMY WITH INTRAOPERATIVE CHOLANGIOGRAM;  Surgeon: Clovis Pu. Cornett, MD;  Location: [facility];   Service: General;  Laterality: N/A;  . COLONOSCOPY  01/2004   DR [facility], few small tics  . ESOPHAGOGASTRODUODENOSCOPY  02/2010   gastric ulcers  . ESOPHAGOGASTRODUODENOSCOPY  12/04/2012   [id number]:[id number] gastritis (inflammation) was found in the gastric antrum; multiple biopsies The duodenal mucosa showed no abnormalities in the bulb and second portion of the duodenum  . JOINT REPLACEMENT Right 10/22/2019  . KYPHOPLASTY N/A 02/14/2019   Procedure: KYPHOPLASTY T12 and L1;  Surgeon: [name], MD;  [facility];  Service: Orthopedics;  Laterality: N/A;  120 mins  . LIPOMA EXCISION  08/02/2011   left shoulder  . NOSE SURGERY    . PARTIAL HYSTERECTOMY     vaginal at age 81 years of age  . TOE DEBRIDEMENT Left 1962   lt great toe  . TOTAL KNEE ARTHROPLASTY Right 10/22/2019   Procedure: TOTAL KNEE ARTHROPLASTY;  Surgeon: Ollen Gross, MD;  [facility];  Service: Orthopedics;  Laterality: Right;   . TOTAL KNEE ARTHROPLASTY Left 03/03/2020   Procedure: TOTAL KNEE ARTHROPLASTY;  Surgeon: Ollen Gross, MD;  [facility];  Service: Orthopedics;  Laterality: Left;   . TRANSFORAMINAL LUMBAR INTERBODY FUSION (TLIF) WITH PEDICLE SCREW FIXATION 1 LEVEL N/A 01/28/2021   Procedure: TRANSFORAMINAL LUMBAR INTERBODY FUSION (TLIF) LUMBAR FIVE-SACRAL ONE;  Surgeon: [name],  MD;  Location: [place];  Service: Orthopedics;  Laterality: N/A;  4 hrs  . VIDEO ASSISTED THORACOSCOPY (VATS)/EMPYEMA Left 10/25/2016   Procedure: VIDEO ASSISTED THORACOSCOPY (VATS), BRONCH,DRAINAGE OF PLEURAL EFFUSION,PERICARDIAL WINDOW WITH DRAINAGE OF PERICARDIAL FLUID, TEE;  Surgeon: [name], MD;  Location: [place];  Service: Thoracic;  Laterality: Left;  Marland Kitchen VIDEO BRONCHOSCOPY N/A 10/25/2016   Procedure: VIDEO BRONCHOSCOPY;  Surgeon: [name], MD;  Location: [place];  Service: Thoracic;  Laterality: N/A;    There were no vitals filed for this visit.   Subjective Assessment - 04/03/21 1158     Subjective COVID-19 screen performed prior to patient entering clinic. That last treatment really helped my back.    Pertinent History Bilateral TKA's, OP, bilateral TKA's, leukemia, kyphoplasty.    How long can you sit comfortably? Varies.    How long can you stand comfortably? Varies.    How long can you walk comfortably? Short community distance.    Patient Stated Goals Get around better without pain.    Currently in Pain? Yes    Pain Score 2     Pain Location Back    Pain Orientation Right;Left    Pain Descriptors / Indicators Discomfort    Pain Type Surgical pain    Pain Onset More than a month ago                             [facility] Adult PT Treatment/Exercise - 04/03/21 0001      Exercises   Exercises Knee/Hip      Lumbar Exercises: Aerobic   Nustep Level 3 x 10 minutes.      Acupuncturist Location Bilateral LB.    Electrical Stimulation Action IFC at 80-150 Hz.    Electrical Stimulation Parameters 40% scan x 20 minutes.    Electrical Stimulation Goals Pain;Tone      Manual Therapy   Manual Therapy Soft tissue mobilization    Manual therapy comments STW/M x 13 minutes.                       PT Long Term Goals - 03/25/21 1437      PT LONG TERM GOAL #1   Title Ind with an HEP.    Time 4    Period Weeks    Status On-going      PT LONG TERM GOAL #2   Title Walk a community distance with pain not > 3/10.    Time 4    Period Weeks    Status On-going      PT LONG TERM GOAL #3   Title Perform ADL's with pain not > 3/10.    Time 4    Period Weeks    Status On-going                 Plan - 04/03/21 1204    Clinical Impression Statement Excellent response to STW/M to patient's bilateral low back region.  Patient states back is feeling very good and her knees actually hurt more.    Personal Factors and Comorbidities Comorbidity 1;Comorbidity 2    Comorbidities Bilateral TKA's, OP, bilateral  TKA's, leukemia, kyphoplasty.    Examination-Activity Limitations Other    Stability/Clinical Decision Making Stable/Uncomplicated    Rehab Potential Excellent    PT Frequency 2x / week    PT Duration 6 weeks    PT Treatment/Interventions ADLs/Self Care  Home Management;Cryotherapy;Electrical Stimulation;Moist Heat;Functional mobility training;Therapeutic activities;Therapeutic exercise;Manual techniques;Patient/family education;Passive range of motion    PT Next Visit Plan Proceed per lumbar fusion protocol.  STW/M as needed.    Consulted and Agree with Plan of Care Patient           Patient will benefit from skilled therapeutic intervention in order to improve the following deficits and impairments:  Pain,Decreased activity tolerance,Increased muscle spasms  Visit Diagnosis: Chronic bilateral low back pain without sciatica     Problem List Patient Active Problem List   Diagnosis Date Noted  . No-show for appointment 02/24/2021  . Sinusitis chronic, frontal 02/03/2021  . S/P lumbar fusion 01/28/2021  . Mixed hyperlipidemia   . Atherosclerosis of aorta ([place]) 09/14/2020  . Anxiety 09/14/2020  . Chronic low back pain 09/14/2020  . Thoracic compression fracture ([place]) 02/14/2019  . Sweating abnormality 10/11/2017  . Iron deficiency anemia 02/16/2017  . Solitary pulmonary nodule 10/23/2016  . Bladder prolapse, female, acquired 11/21/2015  . IBS (irritable bowel syndrome) 11/21/2015  . Neutrophilic leukocytosis 29/93/7169  . OA (osteoarthritis) of knee 11/21/2015  . CML (chronic myeloid leukemia) ([place]) 11/20/2015  . Dyspepsia 11/30/2012  . Hypothyroidism 02/11/2010    Syncere Eble, [place] MPT 04/03/2021, 12:07 PM  [facility] [address] [place], [place], [address] Phone: [phone]   Fax:  [phone]  Name: Andrea Martinez MRN: [id number] Date of Birth: 05/17/1946

## 2021-04-06 ENCOUNTER — Other Ambulatory Visit: Payer: Self-pay

## 2021-04-06 ENCOUNTER — Ambulatory Visit: Payer: Medicare Other | Admitting: Physical Therapy

## 2021-04-06 DIAGNOSIS — M545 Low back pain, unspecified: Secondary | ICD-10-CM | POA: Diagnosis not present

## 2021-04-06 DIAGNOSIS — G8929 Other chronic pain: Secondary | ICD-10-CM | POA: Diagnosis not present

## 2021-04-06 DIAGNOSIS — Z981 Arthrodesis status: Secondary | ICD-10-CM | POA: Diagnosis not present

## 2021-04-06 DIAGNOSIS — M47896 Other spondylosis, lumbar region: Secondary | ICD-10-CM | POA: Diagnosis not present

## 2021-04-06 NOTE — Therapy (Signed)
[facility] [place], [place], [address] Phone: [phone]   Fax:  [phone]  Physical Therapy Treatment  Patient Details  Name: Andrea Martinez MRN: [id number] Date of Birth: 1946/10/02 Referring Provider (PT): Melina Schools MD   Encounter Date: 04/06/2021   PT End of Session - 04/06/21 1120    Visit Number 5    Number of Visits 12    Date for PT Re-Evaluation 04/20/21    PT Start Time 1116    PT Stop Time 1206    PT Time Calculation (min) 50 min    Activity Tolerance Patient tolerated treatment well    Behavior During Therapy [facility] for tasks assessed/performed           Past Medical History:  Diagnosis Date  . Anxiety    denies  . Arthritis   . 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 Gastric ulcer 2011   EGD, 5/11  . History of peptic ulcer disease   . Hypothyroidism    not on meds, followed by Dr. Elyse Hsu  . Lipoma    left upper arm  . Mixed hyperlipidemia   . Osteoporosis   . Pericardial effusion    a. HCAP complicated by pericardial effusion requiring pericardial window 10/2016 and large L pleural effusion requring VATS.  Marland Kitchen Pleural effusion    a. s/p VATS 2017.  Marland Kitchen Pneumonia 10/2016  . PONV (postoperative nausea and vomiting)    history of  . Prolonged QT interval   . Urine incontinence   . UTI (lower urinary tract infection)    frequent  . Vitamin D deficiency     Past Surgical History:  Procedure Laterality Date  . BRAVO [place] STUDY  12/04/2012   Procedure: BRAVO [place];  Surgeon: [name], MD;  [facility];  Service: Endoscopy;  Laterality: N/A;  . CHOLECYSTECTOMY N/A 01/03/2014   Procedure: LAPAROSCOPIC CHOLECYSTECTOMY WITH INTRAOPERATIVE CHOLANGIOGRAM;  Surgeon: Joyice Faster. Cornett, MD;  Location: [place];  Service: General;  Laterality: N/A;  . COLONOSCOPY  01/2004   DR [facility], few small tics  . ESOPHAGOGASTRODUODENOSCOPY   02/2010   gastric ulcers  . ESOPHAGOGASTRODUODENOSCOPY  12/04/2012   [id number] gastritis (inflammation) was found in the gastric antrum; multiple biopsies The duodenal mucosa showed no abnormalities in the bulb and second portion of the duodenum  . JOINT REPLACEMENT Right 10/22/2019  . KYPHOPLASTY N/A 02/14/2019   Procedure: KYPHOPLASTY T12 and L1;  Surgeon: Melina Schools, MD;  Location: [place];  Service: Orthopedics;  Laterality: N/A;  120 mins  . LIPOMA EXCISION  08/02/2011   left shoulder  . NOSE SURGERY    . PARTIAL HYSTERECTOMY     vaginal at age 22 years of age  . TOE DEBRIDEMENT Left 1962   lt great toe  . TOTAL KNEE ARTHROPLASTY Right 10/22/2019   Procedure: TOTAL KNEE ARTHROPLASTY;  Surgeon: Gaynelle Arabian, MD;  [facility];  Service: Orthopedics;  Laterality: Right;  90min  . TOTAL KNEE ARTHROPLASTY Left 03/03/2020   Procedure: TOTAL KNEE ARTHROPLASTY;  Surgeon: Gaynelle Arabian, MD;  [facility];  Service: Orthopedics;  Laterality: Left;  60min  . TRANSFORAMINAL LUMBAR INTERBODY FUSION (TLIF) WITH PEDICLE SCREW FIXATION 1 LEVEL N/A 01/28/2021   Procedure: TRANSFORAMINAL LUMBAR INTERBODY FUSION (TLIF) LUMBAR FIVE-SACRAL ONE;  Surgeon: Melina Schools, MD;  Location: [place];  Service: Orthopedics;  Laterality: N/A;  4 hrs  . VIDEO ASSISTED THORACOSCOPY (VATS)/EMPYEMA Left 10/25/2016  Procedure: VIDEO ASSISTED THORACOSCOPY (VATS), BRONCH,DRAINAGE OF PLEURAL EFFUSION,PERICARDIAL WINDOW WITH DRAINAGE OF PERICARDIAL FLUID, TEE;  Surgeon: [name], MD;  Location: [place];  Service: Thoracic;  Laterality: Left;  Marland Kitchen VIDEO BRONCHOSCOPY N/A 10/25/2016   Procedure: VIDEO BRONCHOSCOPY;  Surgeon: [name], MD;  Location: [place];  Service: Thoracic;  Laterality: N/A;    There were no vitals filed for this visit.   Subjective Assessment - 04/06/21 1120    Subjective COVID-19 screen performed prior to patient entering clinic.  Back feel stiff today.  I'm trying to suck my  tummy in and walk upright.    Pertinent History Bilateral TKA's, OP, bilateral TKA's, leukemia, kyphoplasty.    How long can you sit comfortably? Varies.    How long can you walk comfortably? Short community distance.    Patient Stated Goals Get around better without pain.    Currently in Pain? Yes    Pain Score 3     Pain Location Back    Pain Orientation Right;Left    Pain Descriptors / Indicators Discomfort    Pain Type Surgical pain    Pain Onset More than a month ago                             [facility] Adult PT Treatment/Exercise - 04/06/21 0001      Exercises   Exercises Knee/Hip      Lumbar Exercises: Aerobic   Nustep Level 3 x 15 minutes.      Acupuncturist Location Bil LB.    Electrical Stimulation Action IFC at 80-150 Hz.    Electrical Stimulation Parameters 40% scan x 20 minutes.    Electrical Stimulation Goals Pain;Tone      Manual Therapy   Manual Therapy Soft tissue mobilization    Manual therapy comments STW/M x 8 minutes to patient's affected lower lumbar musculature to reduce tone.                       PT Long Term Goals - 03/25/21 1437      PT LONG TERM GOAL #1   Title Ind with an HEP.    Time 4    Period Weeks    Status On-going      PT LONG TERM GOAL #2   Title Walk a community distance with pain not > 3/10.    Time 4    Period Weeks    Status On-going      PT LONG TERM GOAL #3   Title Perform ADL's with pain not > 3/10.    Time 4    Period Weeks    Status On-going                 Plan - 04/06/21 1155    Clinical Impression Statement Patient's back feeling stiff upon entering clinic but felt much better after STW/M.  Cued patient to stand upright and facilitating via the draw-in exercise.    Personal Factors and Comorbidities Comorbidity 1;Comorbidity 2    Comorbidities Bilateral TKA's, OP, bilateral TKA's, leukemia, kyphoplasty.    Examination-Participation  Restrictions Other    Stability/Clinical Decision Making Stable/Uncomplicated    Rehab Potential Excellent    PT Frequency 2x / week    PT Duration 6 weeks    PT Treatment/Interventions ADLs/Self Care Home Management;Cryotherapy;Electrical Stimulation;Moist Heat;Functional mobility training;Therapeutic activities;Therapeutic exercise;Manual techniques;Patient/family education;Passive range of motion  PT Next Visit Plan Proceed per lumbar fusion protocol.  STW/M as needed.    Consulted and Agree with Plan of Care Patient           Patient will benefit from skilled therapeutic intervention in order to improve the following deficits and impairments:  Pain,Decreased activity tolerance,Increased muscle spasms  Visit Diagnosis: Chronic bilateral low back pain without sciatica     Problem List Patient Active Problem List   Diagnosis Date Noted  . No-show for appointment 02/24/2021  . Sinusitis chronic, frontal 02/03/2021  . S/P lumbar fusion 01/28/2021  . Mixed hyperlipidemia   . Atherosclerosis of aorta ([place]) 09/14/2020  . Anxiety 09/14/2020  . Chronic low back pain 09/14/2020  . Thoracic compression fracture ([place]) 02/14/2019  . Sweating abnormality 10/11/2017  . Iron deficiency anemia 02/16/2017  . Solitary pulmonary nodule 10/23/2016  . Bladder prolapse, female, acquired 11/21/2015  . IBS (irritable bowel syndrome) 11/21/2015  . Neutrophilic leukocytosis 59/93/5701  . OA (osteoarthritis) of knee 11/21/2015  . CML (chronic myeloid leukemia) ([place]) 11/20/2015  . Dyspepsia 11/30/2012  . Hypothyroidism 02/11/2010    Andrea Martinez, [place] MPT 04/06/2021, 12:11 PM  [facility] [address] [place], [place], [address] Phone: [phone]   Fax:  [phone]  Name: Andrea Martinez MRN: [id number] Date of Birth: 07-22-1946

## 2021-04-08 ENCOUNTER — Ambulatory Visit: Payer: Medicare Other | Admitting: Physical Therapy

## 2021-04-08 ENCOUNTER — Other Ambulatory Visit: Payer: Self-pay

## 2021-04-08 DIAGNOSIS — M545 Low back pain, unspecified: Secondary | ICD-10-CM

## 2021-04-08 DIAGNOSIS — G8929 Other chronic pain: Secondary | ICD-10-CM

## 2021-04-08 DIAGNOSIS — M47896 Other spondylosis, lumbar region: Secondary | ICD-10-CM | POA: Diagnosis not present

## 2021-04-08 DIAGNOSIS — Z981 Arthrodesis status: Secondary | ICD-10-CM | POA: Diagnosis not present

## 2021-04-08 NOTE — Therapy (Signed)
[facility] [place], [place], [address] Phone: [phone]   Fax:  [phone]  Physical Therapy Treatment  Patient Details  Name: BASHA KRYGIER MRN: [id number] Date of Birth: May 05, 1946 Referring Provider (PT): Melina Schools MD   Encounter Date: 04/08/2021   PT End of Session - 04/08/21 1202    Visit Number 6    Number of Visits 12    Date for PT Re-Evaluation 04/20/21    Authorization Type FOTO AT LEAST EVERY 5TH VISIT.  PROGRESS NOTE AT 10TH VISIT.  KX MODIFIER AFTER 15 VISITS.    PT Start Time 1115    PT Stop Time 1207    PT Time Calculation (min) 52 min    Activity Tolerance Patient tolerated treatment well    Behavior During Therapy WFL for tasks assessed/performed           Past Medical History:  Diagnosis Date  . Anxiety    denies  . Arthritis   . Chronic myelogenous leukemia (CML), BCR-ABL1-positive ([place]) 11/20/2015  . Gastric ulcer 2011   EGD, 5/11  . History of peptic ulcer disease   . Hypothyroidism    not on meds, followed by Dr. Elyse Hsu  . Lipoma    left upper arm  . Mixed hyperlipidemia   . Osteoporosis   . Pericardial effusion    a. HCAP complicated by pericardial effusion requiring pericardial window 10/2016 and large L pleural effusion requring VATS.  Marland Kitchen Pleural effusion    a. s/p VATS 2017.  Marland Kitchen Pneumonia 10/2016  . PONV (postoperative nausea and vomiting)    history of  . Prolonged QT interval   . Urine incontinence   . UTI (lower urinary tract infection)    frequent  . Vitamin D deficiency     Past Surgical History:  Procedure Laterality Date  . BRAVO [place] STUDY  12/04/2012   Procedure: BRAVO [place];  Surgeon: [name], MD;  [facility];  Service: Endoscopy;  Laterality: N/A;  . CHOLECYSTECTOMY N/A 01/03/2014   Procedure: LAPAROSCOPIC CHOLECYSTECTOMY WITH INTRAOPERATIVE CHOLANGIOGRAM;  Surgeon: Joyice Faster. Cornett, MD;  Location: [place];   Service: General;  Laterality: N/A;  . COLONOSCOPY  01/2004   DR [facility], few small tics  . ESOPHAGOGASTRODUODENOSCOPY  02/2010   gastric ulcers  . ESOPHAGOGASTRODUODENOSCOPY  12/04/2012   [id number] gastritis (inflammation) was found in the gastric antrum; multiple biopsies The duodenal mucosa showed no abnormalities in the bulb and second portion of the duodenum  . JOINT REPLACEMENT Right 10/22/2019  . KYPHOPLASTY N/A 02/14/2019   Procedure: KYPHOPLASTY T12 and L1;  Surgeon: Melina Schools, MD;  Location: [place];  Service: Orthopedics;  Laterality: N/A;  120 mins  . LIPOMA EXCISION  08/02/2011   left shoulder  . NOSE SURGERY    . PARTIAL HYSTERECTOMY     vaginal at age 38 years of age  . TOE DEBRIDEMENT Left 1962   lt great toe  . TOTAL KNEE ARTHROPLASTY Right 10/22/2019   Procedure: TOTAL KNEE ARTHROPLASTY;  Surgeon: Gaynelle Arabian, MD;  [facility];  Service: Orthopedics;  Laterality: Right;  27min  . TOTAL KNEE ARTHROPLASTY Left 03/03/2020   Procedure: TOTAL KNEE ARTHROPLASTY;  Surgeon: Gaynelle Arabian, MD;  [facility];  Service: Orthopedics;  Laterality: Left;  59min  . TRANSFORAMINAL LUMBAR INTERBODY FUSION (TLIF) WITH PEDICLE SCREW FIXATION 1 LEVEL N/A 01/28/2021   Procedure: TRANSFORAMINAL LUMBAR INTERBODY FUSION (TLIF) LUMBAR FIVE-SACRAL ONE;  Surgeon: Melina Schools,  MD;  Location: [place];  Service: Orthopedics;  Laterality: N/A;  4 hrs  . VIDEO ASSISTED THORACOSCOPY (VATS)/EMPYEMA Left 10/25/2016   Procedure: VIDEO ASSISTED THORACOSCOPY (VATS), BRONCH,DRAINAGE OF PLEURAL EFFUSION,PERICARDIAL WINDOW WITH DRAINAGE OF PERICARDIAL FLUID, TEE;  Surgeon: [name], MD;  Location: [place];  Service: Thoracic;  Laterality: Left;  Marland Kitchen VIDEO BRONCHOSCOPY N/A 10/25/2016   Procedure: VIDEO BRONCHOSCOPY;  Surgeon: [name], MD;  Location: [place];  Service: Thoracic;  Laterality: N/A;    There were no vitals filed for this visit.   Subjective Assessment - 04/08/21 1201     Subjective COVID-19 screen performed prior to patient entering clinic.  Back and knees bothering me today.    How long can you sit comfortably? Varies.    Currently in Pain? Yes    Pain Score 4     Pain Location Back    Pain Orientation Right;Left    Pain Descriptors / Indicators Discomfort    Pain Type Surgical pain    Pain Onset More than a month ago                             [facility] Adult PT Treatment/Exercise - 04/08/21 0001      Exercises   Exercises Knee/Hip      Lumbar Exercises: Aerobic   Nustep Level 4 x 15 minutes.      Lumbar Exercises: Standing   Other Standing Lumbar Exercises Standing on rockerboard in parallel bars x 4 minutes with draw-in exercise for neuro re-edu.      Lumbar Exercises: Seated   Other Seated Lumbar Exercises Blue XTS to fatigue x 2 lat PD's and scap retraction.      Modalities   Modalities Electrical Stimulation;Moist Research officer, political party Location Bil LB    Electrical Stimulation Action IFC at 80-150 Hz x 20 minutes.    Electrical Stimulation Goals Pain;Tone                       PT Long Term Goals - 03/25/21 1437      PT LONG TERM GOAL #1   Title Ind with an HEP.    Time 4    Period Weeks    Status On-going      PT LONG TERM GOAL #2   Title Walk a community distance with pain not > 3/10.    Time 4    Period Weeks    Status On-going      PT LONG TERM GOAL #3   Title Perform ADL's with pain not > 3/10.    Time 4    Period Weeks    Status On-going                 Plan - 04/08/21 1209    Clinical Impression Statement The patient did an excellent job with the addition of ther ex today.  Cuing require to draw-in to improve posture.    Personal Factors and Comorbidities Comorbidity 1;Comorbidity 2    Examination-Activity Limitations Other    Rehab Potential Excellent    PT Frequency 2x / week    PT Duration 6 weeks    PT Treatment/Interventions  ADLs/Self Care Home Management;Cryotherapy;Electrical Stimulation;Moist Heat;Functional mobility training;Therapeutic activities;Therapeutic exercise;Manual techniques;Patient/family education;Passive range of motion    PT Next Visit Plan Proceed per lumbar fusion protocol.  STW/M as needed.  Consulted and Agree with Plan of Care Patient           Patient will benefit from skilled therapeutic intervention in order to improve the following deficits and impairments:     Visit Diagnosis: Chronic bilateral low back pain without sciatica     Problem List Patient Active Problem List   Diagnosis Date Noted  . No-show for appointment 02/24/2021  . Sinusitis chronic, frontal 02/03/2021  . S/P lumbar fusion 01/28/2021  . Mixed hyperlipidemia   . Atherosclerosis of aorta ([place]) 09/14/2020  . Anxiety 09/14/2020  . Chronic low back pain 09/14/2020  . Thoracic compression fracture ([place]) 02/14/2019  . Sweating abnormality 10/11/2017  . Iron deficiency anemia 02/16/2017  . Solitary pulmonary nodule 10/23/2016  . Bladder prolapse, female, acquired 11/21/2015  . IBS (irritable bowel syndrome) 11/21/2015  . Neutrophilic leukocytosis 81/19/1478  . OA (osteoarthritis) of knee 11/21/2015  . CML (chronic myeloid leukemia) ([place]) 11/20/2015  . Dyspepsia 11/30/2012  . Hypothyroidism 02/11/2010    Cavon Nicolls, [place] MPT 04/08/2021, 12:11 PM  [facility] [address] [place], [place], [address] Phone: [phone]   Fax:  [phone]  Name: DENICIA PAGLIARULO MRN: [id number] Date of Birth: 06-28-1946

## 2021-04-13 ENCOUNTER — Other Ambulatory Visit: Payer: Self-pay

## 2021-04-13 ENCOUNTER — Ambulatory Visit: Payer: Medicare Other | Attending: Orthopedic Surgery | Admitting: Physical Therapy

## 2021-04-13 ENCOUNTER — Encounter: Payer: Self-pay | Admitting: Physical Therapy

## 2021-04-13 DIAGNOSIS — M545 Low back pain, unspecified: Secondary | ICD-10-CM | POA: Insufficient documentation

## 2021-04-13 DIAGNOSIS — M25562 Pain in left knee: Secondary | ICD-10-CM | POA: Diagnosis not present

## 2021-04-13 DIAGNOSIS — G8929 Other chronic pain: Secondary | ICD-10-CM | POA: Insufficient documentation

## 2021-04-13 NOTE — Therapy (Signed)
[facility] [place], [place], [address] Phone: [phone]   Fax:  [phone]  Physical Therapy Treatment  Patient Details  Name: Andrea Martinez MRN: [id number] Date of Birth: 1946/11/23 Referring Provider (PT): Melina Schools MD   Encounter Date: 04/13/2021   PT End of Session - 04/13/21 1214    Visit Number 7    Number of Visits 12    Date for PT Re-Evaluation 04/20/21    Authorization Type FOTO AT LEAST EVERY 5TH VISIT.  PROGRESS NOTE AT 10TH VISIT.  KX MODIFIER AFTER 15 VISITS.    PT Start Time 1115    PT Stop Time 1207    PT Time Calculation (min) 52 min    Activity Tolerance Patient tolerated treatment well    Behavior During Therapy WFL for tasks assessed/performed           Past Medical History:  Diagnosis Date  . Anxiety    denies  . Arthritis   . Chronic myelogenous leukemia (CML), BCR-ABL1-positive ([place]) 11/20/2015  . Gastric ulcer 2011   EGD, 5/11  . History of peptic ulcer disease   . Hypothyroidism    not on meds, followed by Dr. Elyse Hsu  . Lipoma    left upper arm  . Mixed hyperlipidemia   . Osteoporosis   . Pericardial effusion    a. HCAP complicated by pericardial effusion requiring pericardial window 10/2016 and large L pleural effusion requring VATS.  Marland Kitchen Pleural effusion    a. s/p VATS 2017.  Marland Kitchen Pneumonia 10/2016  . PONV (postoperative nausea and vomiting)    history of  . Prolonged QT interval   . Urine incontinence   . UTI (lower urinary tract infection)    frequent  . Vitamin D deficiency     Past Surgical History:  Procedure Laterality Date  . BRAVO [place] STUDY  12/04/2012   Procedure: BRAVO [place];  Surgeon: [name], MD;  [facility];  Service: Endoscopy;  Laterality: N/A;  . CHOLECYSTECTOMY N/A 01/03/2014   Procedure: LAPAROSCOPIC CHOLECYSTECTOMY WITH INTRAOPERATIVE CHOLANGIOGRAM;  Surgeon: Joyice Faster. Cornett, MD;  Location: [place];  Service:  General;  Laterality: N/A;  . COLONOSCOPY  01/2004   DR [facility], few small tics  . ESOPHAGOGASTRODUODENOSCOPY  02/2010   gastric ulcers  . ESOPHAGOGASTRODUODENOSCOPY  12/04/2012   [id number] gastritis (inflammation) was found in the gastric antrum; multiple biopsies The duodenal mucosa showed no abnormalities in the bulb and second portion of the duodenum  . JOINT REPLACEMENT Right 10/22/2019  . KYPHOPLASTY N/A 02/14/2019   Procedure: KYPHOPLASTY T12 and L1;  Surgeon: Melina Schools, MD;  Location: [facility];  Service: Orthopedics;  Laterality: N/A;  120 mins  . LIPOMA EXCISION  08/02/2011   left shoulder  . NOSE SURGERY    . PARTIAL HYSTERECTOMY     vaginal at age 57 years of age  . TOE DEBRIDEMENT Left 1962   lt great toe  . TOTAL KNEE ARTHROPLASTY Right 10/22/2019   Procedure: TOTAL KNEE ARTHROPLASTY;  Surgeon: Gaynelle Arabian, MD;  [facility];  Service: Orthopedics;  Laterality: Right;  31min  . TOTAL KNEE ARTHROPLASTY Left 03/03/2020   Procedure: TOTAL KNEE ARTHROPLASTY;  Surgeon: Gaynelle Arabian, MD;  [facility];  Service: Orthopedics;  Laterality: Left;  28min  . TRANSFORAMINAL LUMBAR INTERBODY FUSION (TLIF) WITH PEDICLE SCREW FIXATION 1 LEVEL N/A 01/28/2021   Procedure: TRANSFORAMINAL LUMBAR INTERBODY FUSION (TLIF) LUMBAR FIVE-SACRAL ONE;  Surgeon: Melina Schools,  MD;  Location: [place];  Service: Orthopedics;  Laterality: N/A;  4 hrs  . VIDEO ASSISTED THORACOSCOPY (VATS)/EMPYEMA Left 10/25/2016   Procedure: VIDEO ASSISTED THORACOSCOPY (VATS), BRONCH,DRAINAGE OF PLEURAL EFFUSION,PERICARDIAL WINDOW WITH DRAINAGE OF PERICARDIAL FLUID, TEE;  Surgeon: [name], MD;  Location: [place];  Service: Thoracic;  Laterality: Left;  Marland Kitchen VIDEO BRONCHOSCOPY N/A 10/25/2016   Procedure: VIDEO BRONCHOSCOPY;  Surgeon: [name], MD;  Location: [place];  Service: Thoracic;  Laterality: N/A;    There were no vitals filed for this visit.   Subjective Assessment - 04/13/21 1103     Subjective COVID-19 screen performed prior to patient entering clinic. Having more knee pain than LBP. Reports walking more this weekend.    Pertinent History Bilateral TKA's, OP, bilateral TKA's, leukemia, kyphoplasty.    How long can you sit comfortably? Varies.    How long can you stand comfortably? Varies.    How long can you walk comfortably? Short community distance.    Patient Stated Goals Get around better without pain.    Currently in Pain? Yes    Pain Score --   No pain score provided   Pain Location Knee    Pain Orientation Right;Left    Pain Descriptors / Indicators Discomfort    Pain Type Chronic pain    Pain Onset More than a month ago    Pain Frequency Constant              [facility] PT Assessment - 04/13/21 0001      Assessment   Medical Diagnosis TFIL L5-S1.    Referring Provider (PT) Melina Schools MD      Precautions   Precaution Comments Per lumbar fusion protocol.                         [facility] Adult PT Treatment/Exercise - 04/13/21 0001      Lumbar Exercises: Aerobic   Nustep Level 4 x 15 minutes.      Lumbar Exercises: Standing   Heel Raises 20 reps;2 seconds    Heel Raises Limitations with glute squeeze    Row Strengthening;Both;20 reps;Theraband    Theraband Level (Row) Level 1 (Yellow)    Other Standing Lumbar Exercises Standing hip abd, flex with core bracing x20 reps      Lumbar Exercises: Seated   Long Arc Quad on Chair Strengthening;Both;20 reps;Weights    LAQ on Chair Weights (lbs) 4    Other Seated Lumbar Exercises Row yellow theraband x20 reos      Modalities   Modalities Electrical Stimulation;Moist Heat      Moist Heat Therapy   Number Minutes Moist Heat 15 Minutes    Moist Heat Location Lumbar Spine      Electrical Stimulation   Electrical Stimulation Location B low back/glute    Electrical Stimulation Action IFC    Electrical Stimulation Parameters 80-150 hz x15 min    Electrical Stimulation Goals Pain;Tone       Manual Therapy   Manual Therapy Soft tissue mobilization    Soft tissue mobilization STW to L glute/piriformis to reduce tone and pain; limited due to tenderness and pain                       PT Long Term Goals - 03/25/21 1437      PT LONG TERM GOAL #1   Title Ind with an HEP.    Time 4  Period Weeks    Status On-going      PT LONG TERM GOAL #2   Title Walk a community distance with pain not > 3/10.    Time 4    Period Weeks    Status On-going      PT LONG TERM GOAL #3   Title Perform ADL's with pain not > 3/10.    Time 4    Period Weeks    Status On-going                 Plan - 04/13/21 1202    Clinical Impression Statement Patient presented in clinic with reports of more knee pain than LBP. Patient guided through more functional activities with core bracing instruction. Other focus of treatment being LE strengthening. Patient also reported tenderness and pain in L glute/piriformis region and was very sensitive to manual therapy attempts to affected musculature. Patient unable to cross LEs for seated figure 4 stretch. Normal modalities response noted following removal of the modalities.    Personal Factors and Comorbidities Comorbidity 1;Comorbidity 2    Comorbidities Bilateral TKA's, OP, bilateral TKA's, leukemia, kyphoplasty.    Examination-Activity Limitations Other    Examination-Participation Restrictions Other    Stability/Clinical Decision Making Stable/Uncomplicated    Rehab Potential Excellent    PT Frequency 2x / week    PT Duration 6 weeks    PT Treatment/Interventions ADLs/Self Care Home Management;Cryotherapy;Electrical Stimulation;Moist Heat;Functional mobility training;Therapeutic activities;Therapeutic exercise;Manual techniques;Patient/family education;Passive range of motion    PT Next Visit Plan Proceed per lumbar fusion protocol.  STW/M as needed.    Consulted and Agree with Plan of Care Patient           Patient will benefit  from skilled therapeutic intervention in order to improve the following deficits and impairments:  Pain,Decreased activity tolerance,Increased muscle spasms  Visit Diagnosis: Chronic bilateral low back pain without sciatica     Problem List Patient Active Problem List   Diagnosis Date Noted  . No-show for appointment 02/24/2021  . Sinusitis chronic, frontal 02/03/2021  . S/P lumbar fusion 01/28/2021  . Mixed hyperlipidemia   . Atherosclerosis of aorta ([place]) 09/14/2020  . Anxiety 09/14/2020  . Chronic low back pain 09/14/2020  . Thoracic compression fracture ([place]) 02/14/2019  . Sweating abnormality 10/11/2017  . Iron deficiency anemia 02/16/2017  . Solitary pulmonary nodule 10/23/2016  . Bladder prolapse, female, acquired 11/21/2015  . IBS (irritable bowel syndrome) 11/21/2015  . Neutrophilic leukocytosis 16/09/9603  . OA (osteoarthritis) of knee 11/21/2015  . CML (chronic myeloid leukemia) ([place]) 11/20/2015  . Dyspepsia 11/30/2012  . Hypothyroidism 02/11/2010    Standley Brooking, PTA 04/13/2021, 12:14 PM  [facility] [address] [place], [place], [address] Phone: [phone]   Fax:  [phone]  Name: Andrea Martinez MRN: [id number] Date of Birth: Apr 12, 1946

## 2021-04-15 ENCOUNTER — Other Ambulatory Visit: Payer: Self-pay

## 2021-04-15 ENCOUNTER — Ambulatory Visit: Payer: Medicare Other | Admitting: Physical Therapy

## 2021-04-15 DIAGNOSIS — M25562 Pain in left knee: Secondary | ICD-10-CM | POA: Diagnosis not present

## 2021-04-15 DIAGNOSIS — G8929 Other chronic pain: Secondary | ICD-10-CM | POA: Diagnosis not present

## 2021-04-15 DIAGNOSIS — M545 Low back pain, unspecified: Secondary | ICD-10-CM | POA: Diagnosis not present

## 2021-04-15 NOTE — Patient Instructions (Addendum)
  Lower Body: Toe Rise   Standing, place feet apart. Hold arms out for balance or use support. Rise up on toes. Hold _3___ seconds, then lower. Repeat immediately. Repeat __10__ times. Do __2__ sessions per day.     Hip abduction   While sitting with good posture, tie theraband around knees and pull apart. Slowly resume starting position. x30 1-2 x day    Knee Flexion: Resisted (Sitting)      SEATED MARCHING - ELASTIC BAND  Start by sitting in a chair with an elastic band wrapped around your lower thighs.  Next, move a knee upward, set it back down and then alternate to the other side. Perform 10-20 1-2x daily   Scapular Retraction: Bilateral  Facing anchor, pull arms back, bringing shoulder blades together. Repeat _30___ times per set. Do __2-3__ sets per session. Do _2___ sessions per day.

## 2021-04-15 NOTE — Therapy (Signed)
[facility] [place], [place], [address] Phone: [phone]   Fax:  [phone]  Physical Therapy Treatment  Patient Details  Name: Andrea Martinez MRN: [id number] Date of Birth: Jun 20, 1946 Referring Provider (PT): Melina Schools MD   Encounter Date: 04/15/2021   PT End of Session - 04/15/21 1120    Visit Number 8    Number of Visits 12    Date for PT Re-Evaluation 04/20/21    Authorization Type FOTO AT LEAST EVERY 5TH VISIT.  PROGRESS NOTE AT 10TH VISIT.  KX MODIFIER AFTER 15 VISITS.    PT Start Time 1114    PT Stop Time 1201    PT Time Calculation (min) 47 min    Activity Tolerance Patient tolerated treatment well    Behavior During Therapy WFL for tasks assessed/performed           Past Medical History:  Diagnosis Date  . Anxiety    denies  . Arthritis   . Chronic myelogenous leukemia (CML), BCR-ABL1-positive ([place]) 11/20/2015  . Gastric ulcer 2011   EGD, 5/11  . History of peptic ulcer disease   . Hypothyroidism    not on meds, followed by Dr. Elyse Hsu  . Lipoma    left upper arm  . Mixed hyperlipidemia   . Osteoporosis   . Pericardial effusion    a. HCAP complicated by pericardial effusion requiring pericardial window 10/2016 and large L pleural effusion requring VATS.  Marland Kitchen Pleural effusion    a. s/p VATS 2017.  Marland Kitchen Pneumonia 10/2016  . PONV (postoperative nausea and vomiting)    history of  . Prolonged QT interval   . Urine incontinence   . UTI (lower urinary tract infection)    frequent  . Vitamin D deficiency     Past Surgical History:  Procedure Laterality Date  . BRAVO [place] STUDY  12/04/2012   Procedure: BRAVO [place];  Surgeon: [name], MD;  [facility];  Service: Endoscopy;  Laterality: N/A;  . CHOLECYSTECTOMY N/A 01/03/2014   Procedure: LAPAROSCOPIC CHOLECYSTECTOMY WITH INTRAOPERATIVE CHOLANGIOGRAM;  Surgeon: Joyice Faster. Cornett, MD;  Location: [place];  Service:  General;  Laterality: N/A;  . COLONOSCOPY  01/2004   DR [facility], few small tics  . ESOPHAGOGASTRODUODENOSCOPY  02/2010   gastric ulcers  . ESOPHAGOGASTRODUODENOSCOPY  12/04/2012   UVO:[id number] gastritis (inflammation) was found in the gastric antrum; multiple biopsies The duodenal mucosa showed no abnormalities in the bulb and second portion of the duodenum  . JOINT REPLACEMENT Right 10/22/2019  . KYPHOPLASTY N/A 02/14/2019   Procedure: KYPHOPLASTY T12 and L1;  Surgeon: Melina Schools, MD;  Location: [place];  Service: Orthopedics;  Laterality: N/A;  120 mins  . LIPOMA EXCISION  08/02/2011   left shoulder  . NOSE SURGERY    . PARTIAL HYSTERECTOMY     vaginal at age 89 years of age  . TOE DEBRIDEMENT Left 1962   lt great toe  . TOTAL KNEE ARTHROPLASTY Right 10/22/2019   Procedure: TOTAL KNEE ARTHROPLASTY;  Surgeon: Gaynelle Arabian, MD;  [facility];  Service: Orthopedics;  Laterality: Right;  68mn  . TOTAL KNEE ARTHROPLASTY Left 03/03/2020   Procedure: TOTAL KNEE ARTHROPLASTY;  Surgeon: AGaynelle Arabian MD;  [facility];  Service: Orthopedics;  Laterality: Left;  522m  . TRANSFORAMINAL LUMBAR INTERBODY FUSION (TLIF) WITH PEDICLE SCREW FIXATION 1 LEVEL N/A 01/28/2021   Procedure: TRANSFORAMINAL LUMBAR INTERBODY FUSION (TLIF) LUMBAR FIVE-SACRAL ONE;  Surgeon: BrMelina Schools  MD;  Location: [place];  Service: Orthopedics;  Laterality: N/A;  4 hrs  . VIDEO ASSISTED THORACOSCOPY (VATS)/EMPYEMA Left 10/25/2016   Procedure: VIDEO ASSISTED THORACOSCOPY (VATS), BRONCH,DRAINAGE OF PLEURAL EFFUSION,PERICARDIAL WINDOW WITH DRAINAGE OF PERICARDIAL FLUID, TEE;  Surgeon: [name], MD;  Location: [place];  Service: Thoracic;  Laterality: Left;  Marland Kitchen VIDEO BRONCHOSCOPY N/A 10/25/2016   Procedure: VIDEO BRONCHOSCOPY;  Surgeon: [name], MD;  Location: [place];  Service: Thoracic;  Laterality: N/A;    There were no vitals filed for this visit.   Subjective Assessment - 04/15/21 1114     Subjective COVID-19 screen performed prior to patient entering clinic. Patient arrived with some ongoing discomfort in knees and very little in low back"one spot" of discomfort    Pertinent History Bilateral TKA's, OP, bilateral TKA's, leukemia, kyphoplasty.    How long can you sit comfortably? Varies.    How long can you stand comfortably? Varies.    How long can you walk comfortably? Short community distance.    Patient Stated Goals Get around better without pain.    Currently in Pain? Yes    Pain Score --   no number provided today   Pain Location Knee    Pain Orientation Right;Left    Pain Descriptors / Indicators Discomfort    Pain Type Chronic pain    Pain Onset More than a month ago    Pain Frequency Intermittent    Aggravating Factors  walking    Pain Relieving Factors rest                             [facility] Adult PT Treatment/Exercise - 04/15/21 0001      Lumbar Exercises: Aerobic   Nustep Level 4 x 16 minutes.      Lumbar Exercises: Standing   Heel Raises 20 reps;2 seconds    Heel Raises Limitations with glute squeeze    Row Strengthening;Both;20 reps;Theraband    Theraband Level (Row) Level 1 (Yellow)      Lumbar Exercises: Seated   Sit to Stand Limitations   unable   Other Seated Lumbar Exercises seated bracing with hip abd and marching and yellow band 2x10 each      Moist Heat Therapy   Number Minutes Moist Heat 15 Minutes    Moist Heat Location Lumbar Spine      Electrical Stimulation   Electrical Stimulation Location B low back/glute    Electrical Stimulation Action IFC    Electrical Stimulation Parameters 80-'150hz'  x67mn    Electrical Stimulation Goals Pain;Tone                  PT Education - 04/15/21 1129    Education Details HEP    Person(s) Educated Patient    Methods Explanation;Demonstration;Handout    Comprehension Verbalized understanding;Returned demonstration               PT Long Term Goals - 04/15/21 1119       PT LONG TERM GOAL #1   Title Ind with an HEP.    Baseline 04/15/21    Time 4    Period Weeks    Status Achieved      PT LONG TERM GOAL #2   Title Walk a community distance with pain not > 3/10.    Baseline able to perform 04/15/21    Time 4    Period Weeks    Status Achieved  PT LONG TERM GOAL #3   Title Perform ADL's with pain not > 3/10.    Baseline able to perform 04/15/21    Time 4    Period Weeks    Status Achieved                 Plan - 04/15/21 1154    Clinical Impression Statement Patient has met all current goals today. patient has very little back discomfort and most pain is in knees. patient is able to perfrom all ADL's and walk with no back pain. Patient given HEP for home progression. Patient to f/u with MD and will be on hold pending appt.    Personal Factors and Comorbidities Comorbidity 1;Comorbidity 2    Comorbidities Bilateral TKA's, OP, bilateral TKA's, leukemia, kyphoplasty.    Examination-Activity Limitations Other    Examination-Participation Restrictions Other    Stability/Clinical Decision Making Stable/Uncomplicated    Rehab Potential Excellent    PT Frequency 2x / week    PT Duration 6 weeks    PT Treatment/Interventions ADLs/Self Care Home Management;Cryotherapy;Electrical Stimulation;Moist Heat;Functional mobility training;Therapeutic activities;Therapeutic exercise;Manual techniques;Patient/family education;Passive range of motion    PT Next Visit Plan on hold to DC pending MD    Consulted and Agree with Plan of Care Patient           Patient will benefit from skilled therapeutic intervention in order to improve the following deficits and impairments:  Pain,Decreased activity tolerance,Increased muscle spasms  Visit Diagnosis: Chronic bilateral low back pain without sciatica  Chronic pain of left knee     Problem List Patient Active Problem List   Diagnosis Date Noted  . No-show for appointment 02/24/2021  . Sinusitis  chronic, frontal 02/03/2021  . S/P lumbar fusion 01/28/2021  . Mixed hyperlipidemia   . Atherosclerosis of aorta ([place]) 09/14/2020  . Anxiety 09/14/2020  . Chronic low back pain 09/14/2020  . Thoracic compression fracture ([place]) 02/14/2019  . Sweating abnormality 10/11/2017  . Iron deficiency anemia 02/16/2017  . Solitary pulmonary nodule 10/23/2016  . Bladder prolapse, female, acquired 11/21/2015  . IBS (irritable bowel syndrome) 11/21/2015  . Neutrophilic leukocytosis 94/70/9628  . OA (osteoarthritis) of knee 11/21/2015  . CML (chronic myeloid leukemia) ([place]) 11/20/2015  . Dyspepsia 11/30/2012  . Hypothyroidism 02/11/2010    Ladean Raya, PTA 04/15/21 12:05 PM   [facility] [place], [place], [address] Phone: [phone]   Fax:  [phone]  Name: ANGELIK WALLS MRN: [id number] Date of Birth: 1946/07/07

## 2021-05-08 DIAGNOSIS — Z4889 Encounter for other specified surgical aftercare: Secondary | ICD-10-CM | POA: Diagnosis not present

## 2021-05-12 ENCOUNTER — Other Ambulatory Visit: Payer: Self-pay

## 2021-05-12 ENCOUNTER — Ambulatory Visit (INDEPENDENT_AMBULATORY_CARE_PROVIDER_SITE_OTHER): Payer: Medicare Other | Admitting: Family Medicine

## 2021-05-12 ENCOUNTER — Encounter: Payer: Self-pay | Admitting: Family Medicine

## 2021-05-12 VITALS — BP 121/60 | HR 84 | Temp 96.1°F | Ht 65.0 in | Wt 200.2 lb

## 2021-05-12 DIAGNOSIS — Z981 Arthrodesis status: Secondary | ICD-10-CM | POA: Diagnosis not present

## 2021-05-12 DIAGNOSIS — N8110 Cystocele, unspecified: Secondary | ICD-10-CM

## 2021-05-12 DIAGNOSIS — E782 Mixed hyperlipidemia: Secondary | ICD-10-CM

## 2021-05-12 DIAGNOSIS — I700 Atherosclerosis of aorta: Secondary | ICD-10-CM | POA: Diagnosis not present

## 2021-05-12 DIAGNOSIS — R300 Dysuria: Secondary | ICD-10-CM

## 2021-05-12 DIAGNOSIS — F419 Anxiety disorder, unspecified: Secondary | ICD-10-CM

## 2021-05-12 LAB — URINALYSIS, ROUTINE W REFLEX MICROSCOPIC
Bilirubin, UA: NEGATIVE
Glucose, UA: NEGATIVE
Ketones, UA: NEGATIVE
Nitrite, UA: NEGATIVE
Protein,UA: NEGATIVE
RBC, UA: NEGATIVE
Specific Gravity, UA: 1.015 (ref 1.005–1.030)
Urobilinogen, Ur: 0.2 mg/dL (ref 0.2–1.0)
pH, UA: 5 (ref 5.0–7.5)

## 2021-05-12 LAB — MICROSCOPIC EXAMINATION: RBC, Urine: NONE SEEN /hpf (ref 0–2)

## 2021-05-12 MED ORDER — ATORVASTATIN CALCIUM 10 MG PO TABS: 10.0000 mg | ORAL_TABLET | Freq: Every day | ORAL | 2 refills | Status: DC

## 2021-05-12 NOTE — Progress Notes (Signed)
Assessment & Plan:  1. Mixed hyperlipidemia Agreeable to starting a statin today. - atorvastatin (LIPITOR) 10 MG tablet; Take 1 tablet (10 mg total) by mouth daily.  Dispense: 30 tablet; Refill: 2  2. Atherosclerosis of aorta (HCC) Agreeable to starting a statin today. - atorvastatin (LIPITOR) 10 MG tablet; Take 1 tablet (10 mg total) by mouth daily.  Dispense: 30 tablet; Refill: 2  3. S/P lumbar fusion Doing well and completing physical therapy.  4. Bladder prolapse, female, acquired - Ambulatory referral to Urology  5. Anxiety Well controlled on current regimen.   6. Dysuria - Urinalysis, Routine w reflex microscopic   Return in about 4 months (around 09/11/2021) for annual physical.  Hendricks Limes, MSN, APRN, FNP-C [facility]  Subjective:    Patient ID: Andrea Martinez, female    DOB: 1946-02-21, 75 y.o.   MRN: [id number]  Patient Care Team: Loman Brooklyn, FNP as PCP - General (Family Medicine) Herminio Commons, MD (Inactive) as PCP - Cardiology (Cardiology) Richmond Campbell, MD as Referring Physician (Gastroenterology) Derek Jack, MD as Medical Oncologist (Medical Oncology) Cassandria Anger, MD as Consulting Physician (Endocrinology)   Chief Complaint:  Chief Complaint  Patient presents with  . Hyperlipidemia    Check up of chronic medical conditions     HPI: Andrea Martinez is a 75 y.o. female presenting on 05/12/2021 for Hyperlipidemia (Check up of chronic medical conditions )  Hyperlipidemia: A statin was recommended after patient's last lipid panel, but she wanted to wait until after she got through her back surgery.  Today her 10-year ASCVD risk score Mikey Bussing DC Brooke Bonito., et al., 2013) is: 16.2%.  She does have known aortic atherosclerosis.  Bladder prolapse: Patient has been to a GYN and been fitted several times for a "device" that she was eventually told was just not going to work for her.  States she has been told in the  past by urology that she needs to have surgery.  She has not previously been mentally ready to have surgery, but is ready now.  Depression screen [facility] 2/9 05/12/2021 01/09/2021 09/11/2020  Decreased Interest 1 1 1   Down, Depressed, Hopeless 1 0 1  PHQ - 2 Score 2 1 2   Altered sleeping 0 1 1  Tired, decreased energy 2 1 2   Change in appetite 1 0 0  Feeling bad or failure about yourself  1 0 1  Trouble concentrating 0 0 1  Moving slowly or fidgety/restless 0 0 0  Suicidal thoughts 0 0 0  PHQ-9 Score 6 3 7   Difficult doing work/chores Not difficult at all Not difficult at all -  Some recent data might be hidden   GAD 7 : Generalized Anxiety Score 05/12/2021 01/09/2021 09/11/2020 06/10/2020  Nervous, Anxious, on Edge 1 0 0 0  Control/stop worrying 0 1 2 0  Worry too much - different things 0 0 2 1  Trouble relaxing 1 0 1 1  Restless 0 0 1 0  Easily annoyed or irritable 1 0 1 0  Afraid - awful might happen 1 0 0 0  Total GAD 7 Score 4 1 7 2   Anxiety Difficulty Not difficult at all Not difficult at all - -    Patient reports that she experienced some lower abdominal pain, urinary frequency, and dysuria yesterday evening.  She has had no symptoms this morning.  Social history:  Relevant past medical, surgical, family and social history reviewed and updated as indicated. Interim medical  history since our last visit reviewed.  Allergies and medications reviewed and updated.  DATA REVIEWED: CHART IN EPIC  ROS: Negative unless specifically indicated above in HPI.    Current Outpatient Medications:  .  escitalopram (LEXAPRO) 20 MG tablet, Take 1.5 tablets (30 mg total) by mouth daily., Disp: 135 tablet, Rfl: 1 .  levothyroxine (SYNTHROID) 125 MCG tablet, Take 1 tablet (125 mcg total) by mouth daily before breakfast., Disp: 90 tablet, Rfl: 3 .  nilotinib (TASIGNA) 150 MG capsule, Take 2 capsules (300 mg total) by mouth every 12 (twelve) hours., Disp: 120 capsule, Rfl: 6   Allergies   Allergen Reactions  . Nucynta [Tapentadol Hcl] Swelling  . Codeine Nausea Only and Other (See Comments)    Headache  . Sulfonamide Derivatives Nausea Only and Other (See Comments)    Headache  . Esomeprazole Magnesium Nausea Only  . Fluconazole Rash    Redness and blistering on left thigh   Past Medical History:  Diagnosis Date  . Anxiety    denies  . Arthritis   . Chronic myelogenous leukemia (CML), BCR-ABL1-positive ([place]) 11/20/2015  . Gastric ulcer 2011   EGD, 5/11  . History of peptic ulcer disease   . Hypothyroidism    not on meds, followed by Dr. Elyse Hsu  . Lipoma    left upper arm  . Mixed hyperlipidemia   . Osteoporosis   . Pericardial effusion    a. HCAP complicated by pericardial effusion requiring pericardial window 10/2016 and large L pleural effusion requring VATS.  Marland Kitchen Pleural effusion    a. s/p VATS 2017.  Marland Kitchen Pneumonia 10/2016  . PONV (postoperative nausea and vomiting)    history of  . Prolonged QT interval   . Urine incontinence   . UTI (lower urinary tract infection)    frequent  . Vitamin D deficiency     Past Surgical History:  Procedure Laterality Date  . BRAVO [place] STUDY  12/04/2012   Procedure: BRAVO [place];  Surgeon: [name], MD;  [facility];  Service: Endoscopy;  Laterality: N/A;  . CHOLECYSTECTOMY N/A 01/03/2014   Procedure: LAPAROSCOPIC CHOLECYSTECTOMY WITH INTRAOPERATIVE CHOLANGIOGRAM;  Surgeon: Joyice Faster. Cornett, MD;  Location: [place];  Service: General;  Laterality: N/A;  . COLONOSCOPY  01/2004   DR [facility], few small tics  . ESOPHAGOGASTRODUODENOSCOPY  02/2010   gastric ulcers  . ESOPHAGOGASTRODUODENOSCOPY  12/04/2012   DEY:CXKGYJE gastritis (inflammation) was found in the gastric antrum; multiple biopsies The duodenal mucosa showed no abnormalities in the bulb and second portion of the duodenum  . JOINT REPLACEMENT Right 10/22/2019  . KYPHOPLASTY N/A 02/14/2019   Procedure: KYPHOPLASTY T12 and L1;   Surgeon: Melina Schools, MD;  Location: [place];  Service: Orthopedics;  Laterality: N/A;  120 mins  . LIPOMA EXCISION  08/02/2011   left shoulder  . NOSE SURGERY    . PARTIAL HYSTERECTOMY     vaginal at age 56 years of age  . TOE DEBRIDEMENT Left 1962   lt great toe  . TOTAL KNEE ARTHROPLASTY Right 10/22/2019   Procedure: TOTAL KNEE ARTHROPLASTY;  Surgeon: Gaynelle Arabian, MD;  [facility];  Service: Orthopedics;  Laterality: Right;  25min  . TOTAL KNEE ARTHROPLASTY Left 03/03/2020   Procedure: TOTAL KNEE ARTHROPLASTY;  Surgeon: Gaynelle Arabian, MD;  [facility];  Service: Orthopedics;  Laterality: Left;  2min  . TRANSFORAMINAL LUMBAR INTERBODY FUSION (TLIF) WITH PEDICLE SCREW FIXATION 1 LEVEL N/A 01/28/2021   Procedure: TRANSFORAMINAL  LUMBAR INTERBODY FUSION (TLIF) LUMBAR FIVE-SACRAL ONE;  Surgeon: Melina Schools, MD;  Location: [place];  Service: Orthopedics;  Laterality: N/A;  4 hrs  . VIDEO ASSISTED THORACOSCOPY (VATS)/EMPYEMA Left 10/25/2016   Procedure: VIDEO ASSISTED THORACOSCOPY (VATS), BRONCH,DRAINAGE OF PLEURAL EFFUSION,PERICARDIAL WINDOW WITH DRAINAGE OF PERICARDIAL FLUID, TEE;  Surgeon: [name], MD;  Location: [place];  Service: Thoracic;  Laterality: Left;  Marland Kitchen VIDEO BRONCHOSCOPY N/A 10/25/2016   Procedure: VIDEO BRONCHOSCOPY;  Surgeon: [name], MD;  Location: [place];  Service: Thoracic;  Laterality: N/A;    Social History   Socioeconomic History  . Marital status: Married    Spouse name: Not on file  . Number of children: 2  . Years of education: 12th grade  . Highest education level: Not on file  Occupational History  . Occupation: retired    Fish farm manager: UENPLOYED    Employer: RETIRED  Tobacco Use  . Smoking status: Never Smoker  . Smokeless tobacco: Never Used  Vaping Use  . Vaping Use: Never used  Substance and Sexual Activity  . Alcohol use: No    Alcohol/week: 0.0 standard drinks  . Drug use: No  . Sexual activity: Yes    Partners: Male     Birth control/protection: None  Other Topics Concern  . Not on file  Social History Narrative  . Not on file   Social Determinants of Health   Financial Resource Strain: Not on file  Food Insecurity: Not on file  Transportation Needs: Not on file  Physical Activity: Not on file  Stress: Not on file  Social Connections: Not on file  Intimate Partner Violence: Not on file        Objective:    BP 121/60   Pulse 84   Temp (!) 96.1 F (35.6 C) (Temporal)   Ht 5\' 5"  (1.651 m)   Wt 200 lb 3.2 oz (90.8 kg)   SpO2 95%   BMI 33.32 kg/m   Wt Readings from Last 3 Encounters:  05/12/21 200 lb 3.2 oz (90.8 kg)  01/28/21 208 lb (94.3 kg)  01/26/21 206 lb 14.4 oz (93.8 kg)    Physical Exam Vitals reviewed.  Constitutional:      General: She is not in acute distress.    Appearance: Normal appearance. She is obese. She is not ill-appearing, toxic-appearing or diaphoretic.  HENT:     Head: Normocephalic and atraumatic.  Eyes:     General: No scleral icterus.       Right eye: No discharge.        Left eye: No discharge.     Conjunctiva/sclera: Conjunctivae normal.  Cardiovascular:     Rate and Rhythm: Normal rate and regular rhythm.     Heart sounds: Normal heart sounds. No murmur heard. No friction rub. No gallop.   Pulmonary:     Effort: Pulmonary effort is normal. No respiratory distress.     Breath sounds: Normal breath sounds. No stridor. No wheezing, rhonchi or rales.  Musculoskeletal:        General: Normal range of motion.     Cervical back: Normal range of motion.  Skin:    General: Skin is warm and dry.     Capillary Refill: Capillary refill takes less than 2 seconds.  Neurological:     General: No focal deficit present.     Mental Status: She is alert and oriented to person, place, and time. Mental status is at baseline.  Psychiatric:  Mood and Affect: Mood normal.        Behavior: Behavior normal.        Thought Content: Thought content normal.         Judgment: Judgment normal.     Lab Results  Component Value Date   TSH 0.90 05/26/2020   Lab Results  Component Value Date   WBC 10.3 01/26/2021   HGB 11.6 (L) 01/26/2021   HCT 38.7 01/26/2021   MCV 88.8 01/26/2021   PLT 305 01/26/2021   Lab Results  Component Value Date   NA 140 01/26/2021   K 4.3 01/26/2021   CO2 23 01/26/2021   GLUCOSE 104 (H) 01/26/2021   BUN 18 01/26/2021   CREATININE 0.93 01/26/2021   BILITOT 0.8 12/24/2020   ALKPHOS 138 (H) 12/24/2020   AST 28 12/24/2020   ALT 15 12/24/2020   PROT 7.2 12/24/2020   ALBUMIN 4.0 12/24/2020   CALCIUM 9.2 01/26/2021   ANIONGAP 10 01/26/2021   Lab Results  Component Value Date   CHOL 206 (H) 01/09/2021   Lab Results  Component Value Date   HDL 60 01/09/2021   Lab Results  Component Value Date   LDLCALC 125 (H) 01/09/2021   Lab Results  Component Value Date   TRIG 116 01/09/2021   Lab Results  Component Value Date   CHOLHDL 3.4 01/09/2021   Lab Results  Component Value Date   HGBA1C 5.3 04/24/2019

## 2021-05-13 ENCOUNTER — Telehealth: Payer: Self-pay

## 2021-05-13 DIAGNOSIS — E039 Hypothyroidism, unspecified: Secondary | ICD-10-CM

## 2021-05-13 NOTE — Telephone Encounter (Signed)
Can you update lab orders for pt for [facility]

## 2021-05-14 ENCOUNTER — Ambulatory Visit (INDEPENDENT_AMBULATORY_CARE_PROVIDER_SITE_OTHER): Payer: Medicare Other | Admitting: *Deleted

## 2021-05-14 DIAGNOSIS — Z0000 Encounter for general adult medical examination without abnormal findings: Secondary | ICD-10-CM | POA: Diagnosis not present

## 2021-05-14 NOTE — Progress Notes (Signed)
MEDICARE ANNUAL WELLNESS VISIT  05/14/2021  Telephone Visit Disclaimer This Medicare AWV was conducted by telephone due to national recommendations for restrictions regarding the COVID-19 Pandemic (e.g. social distancing).  I verified, using two identifiers, that I am speaking with Andrea Martinez or their authorized healthcare agent. I discussed the limitations, risks, security, and privacy concerns of performing an evaluation and management service by telephone and the potential availability of an in-person appointment in the future. The patient expressed understanding and agreed to proceed.  Location of Patient: Home Location of Provider (nurse):office  Subjective:    Andrea Martinez is a 75 y.o. female patient of Loman Brooklyn, FNP who had a Medicare Annual Wellness Visit today via telephone. Andrea Martinez is Retired and lives with their spouse. she has 2 children. she reports that she is socially active and does interact with friends/family regularly. she is minimally physically active and enjoys reading and gardening on her [place]  Patient Care Team: Loman Brooklyn, FNP as PCP - General (Family Medicine) Derek Jack, MD as Medical Oncologist (Medical Oncology) Cassandria Anger, MD as Consulting Physician (Endocrinology) Melina Schools, MD as Consulting Physician (Orthopedic Surgery)  Advanced Directives 05/14/2021 03/23/2021 01/29/2021 01/28/2021 01/26/2021 12/31/2020 08/28/2020  Does Patient Have a Medical Advance Directive? Yes Yes Yes Yes Yes Yes Yes  Type of Paramedic of [place];Living will - [place];Living will - [place];Living will [place];Living will [place];Living will  Does patient want to make changes to medical advance directive? No - Patient declined - No - Patient declined - No - Patient declined No - Patient declined No - Patient declined  Copy of [place] in Chart? No - copy requested - - - - Yes - validated most recent copy scanned in chart (See row information) No - copy requested  Would patient like information on creating a medical advance directive? - - - - - - No - Patient declined  Pre-existing out of facility DNR order (yellow form or pink MOST form) - - - - - - -    Hospital Utilization Over the Past 12 Months: # of hospitalizations or ER visits: 2 # of surgeries: 1  Review of Systems    Patient reports that her overall health is better compared to last year.  History obtained from chart review and the patient  Patient Reported Readings (BP, Pulse, CBG, Weight, etc) none  Pain Assessment Pain : No/denies pain     Current Medications & Allergies (verified) Allergies as of 05/14/2021      Reactions   Nucynta [tapentadol Hcl] Swelling   Codeine Nausea Only, Other (See Comments)   Headache   Sulfonamide Derivatives Nausea Only, Other (See Comments)   Headache   Esomeprazole Magnesium Nausea Only   Fluconazole Rash   Redness and blistering on left thigh      Medication List       Accurate as of May 14, 2021  8:44 AM. If you have any questions, ask your nurse or doctor.        atorvastatin 10 MG tablet Commonly known as: LIPITOR Take 1 tablet (10 mg total) by mouth daily.   escitalopram 20 MG tablet Commonly known as: LEXAPRO Take 1.5 tablets (30 mg total) by mouth daily.   levothyroxine 125 MCG tablet Commonly known as: SYNTHROID Take 1 tablet (125 mcg total) by mouth daily before breakfast.   nilotinib 150  MG capsule Commonly known as: TASIGNA Take 2 capsules (300 mg total) by mouth every 12 (twelve) hours.       History (reviewed): Past Medical History:  Diagnosis Date  . Anxiety    denies  . Arthritis   . 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 Gastric ulcer 2011   EGD, 5/11  . History of peptic ulcer disease   . Hypothyroidism    not on  meds, followed by Dr. Elyse Hsu  . Lipoma    left upper arm  . Mixed hyperlipidemia   . Osteoporosis   . Pericardial effusion    a. HCAP complicated by pericardial effusion requiring pericardial window 10/2016 and large L pleural effusion requring VATS.  Marland Kitchen Pleural effusion    a. s/p VATS 2017.  Marland Kitchen Pneumonia 10/2016  . PONV (postoperative nausea and vomiting)    history of  . Prolonged QT interval   . Urine incontinence   . UTI (lower urinary tract infection)    frequent  . Vitamin D deficiency    Past Surgical History:  Procedure Laterality Date  . BRAVO [place] STUDY  12/04/2012   Procedure: BRAVO [place];  Surgeon: [name], MD;  [facility];  Service: Endoscopy;  Laterality: N/A;  . CHOLECYSTECTOMY N/A 01/03/2014   Procedure: LAPAROSCOPIC CHOLECYSTECTOMY WITH INTRAOPERATIVE CHOLANGIOGRAM;  Surgeon: Joyice Faster. Cornett, MD;  Location: [place];  Service: General;  Laterality: N/A;  . COLONOSCOPY  01/2004   DR [facility], few small tics  . ESOPHAGOGASTRODUODENOSCOPY  02/2010   gastric ulcers  . ESOPHAGOGASTRODUODENOSCOPY  12/04/2012   GOT:LXBWIOM gastritis (inflammation) was found in the gastric antrum; multiple biopsies The duodenal mucosa showed no abnormalities in the bulb and second portion of the duodenum  . JOINT REPLACEMENT Right 10/22/2019  . KYPHOPLASTY N/A 02/14/2019   Procedure: KYPHOPLASTY T12 and L1;  Surgeon: Melina Schools, MD;  Location: [place];  Service: Orthopedics;  Laterality: N/A;  120 mins  . LIPOMA EXCISION  08/02/2011   left shoulder  . NOSE SURGERY    . PARTIAL HYSTERECTOMY     vaginal at age 32 years of age  . TOE DEBRIDEMENT Left 1962   lt great toe  . TOTAL KNEE ARTHROPLASTY Right 10/22/2019   Procedure: TOTAL KNEE ARTHROPLASTY;  Surgeon: Gaynelle Arabian, MD;  Location: [facility];  Service: Orthopedics;  Laterality: Right;  36min  . TOTAL KNEE ARTHROPLASTY Left 03/03/2020   Procedure: TOTAL KNEE ARTHROPLASTY;  Surgeon: Gaynelle Arabian,  MD;  Location: [facility];  Service: Orthopedics;  Laterality: Left;  69min  . TRANSFORAMINAL LUMBAR INTERBODY FUSION (TLIF) WITH PEDICLE SCREW FIXATION 1 LEVEL N/A 01/28/2021   Procedure: TRANSFORAMINAL LUMBAR INTERBODY FUSION (TLIF) LUMBAR FIVE-SACRAL ONE;  Surgeon: Melina Schools, MD;  Location: [place];  Service: Orthopedics;  Laterality: N/A;  4 hrs  . VIDEO ASSISTED THORACOSCOPY (VATS)/EMPYEMA Left 10/25/2016   Procedure: VIDEO ASSISTED THORACOSCOPY (VATS), BRONCH,DRAINAGE OF PLEURAL EFFUSION,PERICARDIAL WINDOW WITH DRAINAGE OF PERICARDIAL FLUID, TEE;  Surgeon: [name], MD;  Location: [place];  Service: Thoracic;  Laterality: Left;  Marland Kitchen VIDEO BRONCHOSCOPY N/A 10/25/2016   Procedure: VIDEO BRONCHOSCOPY;  Surgeon: [name], MD;  Location: [facility];  Service: Thoracic;  Laterality: N/A;   Family History  Problem Relation Age of Onset  . Lung cancer Father   . COPD Mother   . Stroke Mother   . Thyroid disease Mother   . Colon cancer Neg Hx   . Anesthesia problems Neg  Hx   . Hypotension Neg Hx   . Malignant hyperthermia Neg Hx   . Pseudochol deficiency Neg Hx    Social History   Socioeconomic History  . Marital status: Married    Spouse name: Not on file  . Number of children: 2  . Years of education: 12th grade  . Highest education level: Not on file  Occupational History  . Occupation: retired    Fish farm manager: UENPLOYED    Employer: RETIRED  Tobacco Use  . Smoking status: Never Smoker  . Smokeless tobacco: Never Used  Vaping Use  . Vaping Use: Never used  Substance and Sexual Activity  . Alcohol use: No    Alcohol/week: 0.0 standard drinks  . Drug use: No  . Sexual activity: Yes    Partners: Male    Birth control/protection: None  Other Topics Concern  . Not on file  Social History Narrative  . Not on file   Social Determinants of Health   Financial Resource Strain: Low Risk   . Difficulty of Paying Living Expenses: Not hard at all  Food Insecurity: No  Food Insecurity  . Worried About Charity fundraiser in the Last Year: Never true  . Ran Out of Food in the Last Year: Never true  Transportation Needs: No Transportation Needs  . Lack of Transportation (Medical): No  . Lack of Transportation (Non-Medical): No  Physical Activity: Sufficiently Active  . Days of Exercise per Week: 7 days  . Minutes of Exercise per Session: 30 min  Stress: No Stress Concern Present  . Feeling of Stress : Only a little  Social Connections: Socially Integrated  . Frequency of Communication with Friends and Family: More than three times a week  . Frequency of Social Gatherings with Friends and Family: More than three times a week  . Attends Religious Services: More than 4 times per year  . Active Member of Clubs or Organizations: Yes  . Attends Archivist Meetings: 1 to 4 times per year  . Marital Status: Married    Activities of Daily Living In your present state of health, do you have any difficulty performing the following activities: 05/14/2021 01/29/2021  Hearing? N N  Vision? N N  Difficulty concentrating or making decisions? N N  Walking or climbing stairs? Y Y  Comment Still recovering from back surgery -  Dressing or bathing? N Y  Doing errands, shopping? Y Y  Comment Husband drives -  Conservation officer, nature and eating ? N -  Using the Toilet? N -  In the past six months, have you accidently leaked urine? N -  Do you have problems with loss of bowel control? N -  Managing your Medications? N -  Managing your Finances? N -  Housekeeping or managing your Housekeeping? N -  Some recent data might be hidden    Patient Education/ Literacy How often do you need to have someone help you when you read instructions, pamphlets, or other written materials from your doctor or pharmacy?: 1 - Never What is the last grade level you completed in school?: 12  Exercise Current Exercise Habits: Home exercise routine (Still doing PT exercises for back and  knees), Type of exercise: stretching;walking, Time (Minutes): 30, Frequency (Times/Week): 7, Weekly Exercise (Minutes/Week): 210, Intensity: Mild, Exercise limited by: orthopedic condition(s)  Diet Patient reports consuming 3 meals a day and 0 snack(s) a day Patient reports that her primary diet is: Low fat Patient reports that she does have  regular access to food.   Depression Screen PHQ 2/9 Scores 05/14/2021 05/12/2021 01/09/2021 09/11/2020 06/10/2020 02/04/2020 01/10/2018  PHQ - 2 Score 0 2 1 2 2  0 0  PHQ- 9 Score - 6 3 7 5  - -     Fall Risk Fall Risk  05/14/2021 05/12/2021 01/09/2021 06/10/2020 02/04/2020  Falls in the past year? 0 0 0 0 0  Number falls in past yr: - - - - 0  Injury with Fall? - - - - 0     Objective:  Andrea Martinez seemed alert and oriented and she participated appropriately during our telephone visit.  Blood Pressure Weight BMI  BP Readings from Last 3 Encounters:  05/12/21 121/60  01/31/21 (!) 141/51  01/26/21 (!) 139/49   Wt Readings from Last 3 Encounters:  05/12/21 200 lb 3.2 oz (90.8 kg)  01/28/21 208 lb (94.3 kg)  01/26/21 206 lb 14.4 oz (93.8 kg)   BMI Readings from Last 1 Encounters:  05/12/21 33.32 kg/m    *Unable to obtain current vital signs, weight, and BMI due to telephone visit type  Hearing/Vision  . Nazly did not seem to have difficulty with hearing/understanding during the telephone conversation . Reports that she has not had a formal eye exam by an eye care professional within the past year . Reports that she has not had a formal hearing evaluation within the past year *Unable to fully assess hearing and vision during telephone visit type  Cognitive Function: 6CIT Screen 05/14/2021  What Year? 0 points  What month? 0 points  What time? 0 points  Count back from 20 0 points  Months in reverse 0 points  Repeat phrase 0 points  Total Score 0   (Normal:0-7, Significant for Dysfunction: >8)  Normal Cognitive Function Screening:  Yes   Immunization & Health Maintenance Record Immunization History  Administered Date(s) Administered  . Fluad Quad(high Dose 65+) 09/11/2020  . Influenza,inj,Quad PF,6+ Mos 09/13/2017  . Influenza-Unspecified 09/24/2015, 11/24/2017  . Moderna Sars-Covid-2 Vaccination 04/02/2020, 04/30/2020, 01/07/2021  . Pneumococcal Conjugate-13 10/09/2014  . Pneumococcal Polysaccharide-23 10/09/2014    Health Maintenance  Topic Date Due  . MAMMOGRAM  12/04/2020  . COVID-19 Vaccine (4 - Booster for Moderna series) 05/28/2021 (Originally 04/07/2021)  . Zoster Vaccines- Shingrix (1 of 2) 08/12/2021 (Originally 01/12/1995)  . TETANUS/TDAP  09/11/2021 (Originally 01/13/1964)  . Hepatitis C Screening  09/11/2021 (Originally 01/12/1964)  . PNA vac Low Risk Adult (2 of 2 - PPSV23) 09/11/2021 (Originally 10/10/2015)  . DEXA SCAN  Completed  . HPV VACCINES  Aged Out       Assessment  This is a routine wellness examination for Andrea Martinez.  Health Maintenance: Due or Overdue Health Maintenance Due  Topic Date Due  . MAMMOGRAM  12/04/2020    Andrea Martinez does not need a referral for Community Assistance: Care Management:   no Social Work:    no Prescription Assistance:  no Nutrition/Diabetes Education:  no   Plan:  Personalized Goals Goals Addressed              This Visit's Progress   .  Increase physical activity (pt-stated)        Get back to normal from back and knee surgeries.     .  Prevent falls        Personalized Health Maintenance & Screening Recommendations  Pneumococcal vaccine  Td vaccine  Shingles Vaccine Mammogram  Lung Cancer Screening Recommended: no (Low Dose CT Chest recommended if  Age 53-80 years, 30 pack-year currently smoking OR have quit w/in past 15 years) Hepatitis C Screening recommended: yes HIV Screening recommended: no  Advanced Directives: Written information was not prepared per patient's request.  Referrals & Orders No orders of the  defined types were placed in this encounter.   Follow-up Plan . Follow-up with Loman Brooklyn, FNP as planned on 09/11/21. Marland Kitchen Pt is aware that she is past due for mammogram and will schedule soon. . Will discuss Tdap and shingrix at next appt. . Pt has had back and knee surgeries recently, she is still doing physical therapy exercises at home for her back. . She is independent with all ADL's. . No hearing or vision impairments. . Pt has living will, POA and DNR. Copies requested for our records. . Pt has no healthcare concerns or questions at this time. . AVs printed and mailed.    I have personally reviewed and noted the following in the patient's chart:   . Medical and social history . Use of alcohol, tobacco or illicit drugs  . Current medications and supplements . Functional ability and status . Nutritional status . Physical activity . Advanced directives . List of other physicians . Hospitalizations, surgeries, and ER visits in previous 12 months . Vitals . Screenings to include cognitive, depression, and falls . Referrals and appointments  In addition, I have reviewed and discussed with Andrea Martinez certain preventive protocols, quality metrics, and best practice recommendations. A written personalized care plan for preventive services as well as general preventive health recommendations is available and can be mailed to the patient at her request.      Rana Snare, LPN  [id number]

## 2021-05-26 ENCOUNTER — Other Ambulatory Visit: Payer: Self-pay

## 2021-05-26 ENCOUNTER — Other Ambulatory Visit: Payer: Self-pay | Admitting: "Endocrinology

## 2021-05-26 DIAGNOSIS — E039 Hypothyroidism, unspecified: Secondary | ICD-10-CM

## 2021-05-27 LAB — T4, FREE: Free T4: 1.65 ng/dL (ref 0.82–1.77)

## 2021-05-27 LAB — TSH: TSH: 0.101 u[IU]/mL — ABNORMAL LOW (ref 0.450–4.500)

## 2021-06-03 ENCOUNTER — Encounter: Payer: Self-pay | Admitting: "Endocrinology

## 2021-06-03 ENCOUNTER — Ambulatory Visit: Payer: Medicare Other | Admitting: "Endocrinology

## 2021-06-03 ENCOUNTER — Other Ambulatory Visit: Payer: Self-pay

## 2021-06-03 VITALS — BP 126/67 | HR 68 | Ht 65.0 in | Wt 198.2 lb

## 2021-06-03 DIAGNOSIS — E039 Hypothyroidism, unspecified: Secondary | ICD-10-CM

## 2021-06-03 MED ORDER — LEVOTHYROXINE SODIUM 112 MCG PO TABS: 112.0000 ug | ORAL_TABLET | Freq: Every day | ORAL | 1 refills | Status: DC

## 2021-06-03 NOTE — Progress Notes (Signed)
06/03/2021     Endocrinology follow-up note    Subjective:    Patient ID: Andrea Martinez, female    DOB: 05/07/1946, PCP Loman Brooklyn, FNP   Past Medical History:  Diagnosis Date   Anxiety    denies   Arthritis    Chronic myelogenous leukemia (CML), BCR-ABL1-positive ([place]) 11/20/2015   Gastric ulcer 2011   EGD, 5/11   History of peptic ulcer disease    Hypothyroidism    not on meds, followed by Dr. Elyse Hsu   Lipoma    left upper arm   Mixed hyperlipidemia    Osteoporosis    Pericardial effusion    a. HCAP complicated by pericardial effusion requiring pericardial window 10/2016 and large L pleural effusion requring VATS.   Pleural effusion    a. s/p VATS 2017.   Pneumonia 10/2016   PONV (postoperative nausea and vomiting)    history of   Prolonged QT interval    Urine incontinence    UTI (lower urinary tract infection)    frequent   Vitamin D deficiency    Past Surgical History:  Procedure Laterality Date   BRAVO [place] STUDY  12/04/2012   Procedure: BRAVO [place];  Surgeon: [name], MD;  [facility];  Service: Endoscopy;  Laterality: N/A;   CHOLECYSTECTOMY N/A 01/03/2014   Procedure: LAPAROSCOPIC CHOLECYSTECTOMY WITH INTRAOPERATIVE CHOLANGIOGRAM;  Surgeon: Joyice Faster. Cornett, MD;  Location: [place];  Service: General;  Laterality: N/A;   COLONOSCOPY  01/2004   DR [facility], few small tics   ESOPHAGOGASTRODUODENOSCOPY  02/2010   gastric ulcers   ESOPHAGOGASTRODUODENOSCOPY  12/04/2012   [id number] gastritis (inflammation) was found in the gastric antrum; multiple biopsies The duodenal mucosa showed no abnormalities in the bulb and second portion of the duodenum   JOINT REPLACEMENT Right 10/22/2019   KYPHOPLASTY N/A 02/14/2019   Procedure: KYPHOPLASTY T12 and L1;  Surgeon: Melina Schools, MD;  Location: [place];  Service: Orthopedics;  Laterality: N/A;  120 mins   LIPOMA EXCISION  08/02/2011   left shoulder   NOSE SURGERY     PARTIAL  HYSTERECTOMY     vaginal at age 33 years of age   TOE 59 Left 1962   lt great toe   TOTAL KNEE ARTHROPLASTY Right 10/22/2019   Procedure: TOTAL KNEE ARTHROPLASTY;  Surgeon: Gaynelle Arabian, MD;  [facility];  Service: Orthopedics;  Laterality: Right;  14min   TOTAL KNEE ARTHROPLASTY Left 03/03/2020   Procedure: TOTAL KNEE ARTHROPLASTY;  Surgeon: Gaynelle Arabian, MD;  [facility];  Service: Orthopedics;  Laterality: Left;  79min   TRANSFORAMINAL LUMBAR INTERBODY FUSION (TLIF) WITH PEDICLE SCREW FIXATION 1 LEVEL N/A 01/28/2021   Procedure: TRANSFORAMINAL LUMBAR INTERBODY FUSION (TLIF) LUMBAR FIVE-SACRAL ONE;  Surgeon: Melina Schools, MD;  Location: [place];  Service: Orthopedics;  Laterality: N/A;  4 hrs   VIDEO ASSISTED THORACOSCOPY (VATS)/EMPYEMA Left 10/25/2016   Procedure: VIDEO ASSISTED THORACOSCOPY (VATS), BRONCH,DRAINAGE OF PLEURAL EFFUSION,PERICARDIAL WINDOW WITH DRAINAGE OF PERICARDIAL FLUID, TEE;  Surgeon: [name], MD;  Location: [place];  Service: Thoracic;  Laterality: Left;   VIDEO BRONCHOSCOPY N/A 10/25/2016   Procedure: VIDEO BRONCHOSCOPY;  Surgeon: [name], MD;  Location: [place];  Service: Thoracic;  Laterality: N/A;   Social History   Socioeconomic History   Marital status: Married    Spouse name: Not on file   Number of children: 2   Years of education: 12th grade   Highest  education level: Not on file  Occupational History   Occupation: retired    Fish farm manager: UENPLOYED    Employer: RETIRED  Tobacco Use   Smoking status: Never   Smokeless tobacco: Never  Vaping Use   Vaping Use: Never used  Substance and Sexual Activity   Alcohol use: No    Alcohol/week: 0.0 standard drinks   Drug use: No   Sexual activity: Yes    Partners: Male    Birth control/protection: None  Other Topics Concern   Not on file  Social History Narrative   Not on file   Social Determinants of Health   Financial Resource Strain: Low Risk    Difficulty of  Paying Living Expenses: Not hard at all  Food Insecurity: No Food Insecurity   Worried About Charity fundraiser in the Last Year: Never true   [place] in the Last Year: Never true  Transportation Needs: No Transportation Needs   Lack of Transportation (Medical): No   Lack of Transportation (Non-Medical): No  Physical Activity: Sufficiently Active   Days of Exercise per Week: 7 days   Minutes of Exercise per Session: 30 min  Stress: No Stress Concern Present   Feeling of Stress : Only a little  Social Connections: Engineer, building services of Communication with Friends and Family: More than three times a week   Frequency of Social Gatherings with Friends and Family: More than three times a week   Attends Religious Services: More than 4 times per year   Active Member of [employer] or Organizations: Yes   Attends Archivist Meetings: 1 to 4 times per year   Marital Status: Married   Outpatient Encounter Medications as of 06/03/2021  Medication Sig   Cholecalciferol (VITAMIN D3) 50 MCG (2000 UT) TABS Take 1 tablet by mouth daily in the afternoon.   Cyanocobalamin (VITAMIN B-12 PO) Take 1 tablet by mouth daily in the afternoon.   Multiple Vitamins-Minerals (CENTRUM SILVER PO) Take 1 tablet by mouth daily in the afternoon.   Probiotic Product (PROBIOTIC DAILY PO) Take 2 tablets by mouth daily in the afternoon.   Vitamin E 450 MG (1000 UT) CAPS Take 1 capsule by mouth daily in the afternoon.   atorvastatin (LIPITOR) 10 MG tablet Take 1 tablet (10 mg total) by mouth daily.   escitalopram (LEXAPRO) 20 MG tablet Take 1.5 tablets (30 mg total) by mouth daily.   levothyroxine (SYNTHROID) 112 MCG tablet Take 1 tablet (112 mcg total) by mouth daily before breakfast.   nilotinib (TASIGNA) 150 MG capsule Take 2 capsules (300 mg total) by mouth every 12 (twelve) hours.   [DISCONTINUED] levothyroxine (SYNTHROID) 125 MCG tablet Take 1 tablet (125 mcg total) by mouth daily before  breakfast.   No facility-administered encounter medications on file as of 06/03/2021.   ALLERGIES: Allergies  Allergen Reactions   Nucynta [Tapentadol Hcl] Swelling   Codeine Nausea Only and Other (See Comments)    Headache   Sulfonamide Derivatives Nausea Only and Other (See Comments)    Headache   Esomeprazole Magnesium Nausea Only   Fluconazole Rash    Redness and blistering on left thigh    VACCINATION STATUS: Immunization History  Administered Date(s) Administered   Fluad Quad(high Dose 65+) 09/11/2020   Influenza,inj,Quad PF,6+ Mos 09/13/2017   Influenza-Unspecified 09/24/2015, 11/24/2017   Moderna Sars-Covid-2 Vaccination 04/02/2020, 04/30/2020, 01/07/2021   Pneumococcal Conjugate-13 10/09/2014   Pneumococcal Polysaccharide-23 10/09/2014    HPI  75 year old female patient with  medical history as above.   She is being seen in follow-up for hypothyroidism.  She remains on levothyroxine 125 mcg p.o. daily before breakfast.  In the interval, she has experienced intentional significant weight loss 20+ pounds.   Her previsit labs are consistent with over replacement. She denies chest pain, shortness of breath. She denies palpitations, tremors, nor heat intolerance.   She is still on nilotinib for CML.    -She has family history of thyroid problem involving goiter which required thyroidectomy in her mother who took thyroid hormone replacement subsequently. -She denies dysphagia, shortness of breath, voice change. -She is compliant to her medications.  Review of Systems Limited as above.  Objective:    BP 126/67   Pulse 68   Ht 5\' 5"  (1.651 m)   Wt 198 lb 3.2 oz (89.9 kg)   BMI 32.98 kg/m   Wt Readings from Last 3 Encounters:  06/03/21 198 lb 3.2 oz (89.9 kg)  05/12/21 200 lb 3.2 oz (90.8 kg)  01/28/21 208 lb (94.3 kg)     CMP     Component Value Date/Time   NA 140 01/26/2021 1024   K 4.3 01/26/2021 1024   CL 107 01/26/2021 1024   CO2 23 01/26/2021 1024    GLUCOSE 104 (H) 01/26/2021 1024   BUN 18 01/26/2021 1024   CREATININE 0.93 01/26/2021 1024   CREATININE 0.87 07/14/2011 0938   CALCIUM 9.2 01/26/2021 1024   PROT 7.2 12/24/2020 1318   ALBUMIN 4.0 12/24/2020 1318   AST 28 12/24/2020 1318   ALT 15 12/24/2020 1318   ALKPHOS 138 (H) 12/24/2020 1318   BILITOT 0.8 12/24/2020 1318   GFRNONAA >60 01/26/2021 1024   GFRAA >60 08/21/2020 1340   Diabetic Labs (most recent): Lab Results  Component Value Date   HGBA1C 5.3 04/24/2019   HGBA1C 5.3 10/28/2016   Recent Results (from the past 2160 hour(s))  Urinalysis, Routine w reflex microscopic     Status: Abnormal   Collection Time: 05/12/21 10:46 AM  Result Value Ref Range   Specific Gravity, UA 1.015 1.005 - 1.030   pH, UA 5.0 5.0 - 7.5   Color, UA Yellow Yellow   Appearance Ur Clear Clear   Leukocytes,UA Trace (A) Negative   Protein,UA Negative Negative/Trace   Glucose, UA Negative Negative   Ketones, UA Negative Negative   RBC, UA Negative Negative   Bilirubin, UA Negative Negative   Urobilinogen, Ur 0.2 0.2 - 1.0 mg/dL   Nitrite, UA Negative Negative   Microscopic Examination See below:   Microscopic Examination     Status: None   Collection Time: 05/12/21 10:46 AM   Urine  Result Value Ref Range   WBC, UA 0-5 0 - 5 /hpf   RBC None seen 0 - 2 /hpf   Epithelial Cells (non renal) 0-10 0 - 10 /hpf   Bacteria, UA Few None seen/Few  T4, free     Status: None   Collection Time: 05/26/21  2:03 PM  Result Value Ref Range   Free T4 1.65 0.82 - 1.77 ng/dL  TSH     Status: Abnormal   Collection Time: 05/26/21  2:03 PM  Result Value Ref Range   TSH 0.101 (L) 0.450 - 4.500 uIU/mL      Assessment & Plan:   1. Hypothyroidism - Her thyroid function tests are consistent with over replacement.  I discussed and lowered her levothyroxine to 112 mcg p.o. daily before breakfast.     - We discussed  about the correct intake of her thyroid hormone, on empty stomach at fasting, with water,  separated by at least 30 minutes from breakfast and other medications,  and separated by more than 4 hours from calcium, iron, multivitamins, acid reflux medications (PPIs). -Patient is made aware of the fact that thyroid hormone replacement is needed for life, dose to be adjusted by periodic monitoring of thyroid function tests.   - Her clinical exam has been  negative for goiter, hence no need for imaging of the thyroid at this time. - I advised patient to maintain close follow up with Loman Brooklyn, FNP for primary care needs.   I spent 21 minutes in the care of the patient today including review of labs from Thyroid Function, CMP, and other relevant labs ; imaging/biopsy records (current and previous including abstractions from other facilities); face-to-face time discussing  her lab results and symptoms, medications doses, her options of short and long term treatment based on the latest standards of care / guidelines;   and documenting the encounter.  [name]  participated in the discussions, expressed understanding, and voiced agreement with the above plans.  All questions were answered to her satisfaction. she is encouraged to contact clinic should she have any questions or concerns prior to her return visit.   Follow up plan: Return in about 4 months (around 10/03/2021) for F/U with Pre-visit Labs.  Glade Lloyd, MD Phone: [phone]  Fax: [phone]  -  This note was partially dictated with voice recognition software. Similar sounding words can be transcribed inadequately or may not  be corrected upon review.  06/03/2021, 4:22 PM

## 2021-06-05 ENCOUNTER — Other Ambulatory Visit: Payer: Self-pay | Admitting: Family Medicine

## 2021-06-05 DIAGNOSIS — K580 Irritable bowel syndrome with diarrhea: Secondary | ICD-10-CM

## 2021-06-11 ENCOUNTER — Other Ambulatory Visit (HOSPITAL_COMMUNITY): Payer: Self-pay

## 2021-06-11 DIAGNOSIS — C9210 Chronic myeloid leukemia, BCR/ABL-positive, not having achieved remission: Secondary | ICD-10-CM

## 2021-06-11 MED ORDER — NILOTINIB HCL 150 MG PO CAPS: 300.0000 mg | ORAL_CAPSULE | Freq: Two times a day (BID) | ORAL | 6 refills | Status: DC

## 2021-06-11 NOTE — Telephone Encounter (Signed)
Tasigna refilled per last office visit note

## 2021-06-17 DIAGNOSIS — R350 Frequency of micturition: Secondary | ICD-10-CM | POA: Diagnosis not present

## 2021-06-30 ENCOUNTER — Other Ambulatory Visit: Payer: Self-pay

## 2021-06-30 ENCOUNTER — Inpatient Hospital Stay (HOSPITAL_COMMUNITY): Payer: Medicare Other | Attending: Hematology

## 2021-06-30 DIAGNOSIS — Z79899 Other long term (current) drug therapy: Secondary | ICD-10-CM | POA: Diagnosis not present

## 2021-06-30 DIAGNOSIS — M549 Dorsalgia, unspecified: Secondary | ICD-10-CM | POA: Insufficient documentation

## 2021-06-30 DIAGNOSIS — M7989 Other specified soft tissue disorders: Secondary | ICD-10-CM | POA: Insufficient documentation

## 2021-06-30 DIAGNOSIS — M79604 Pain in right leg: Secondary | ICD-10-CM | POA: Insufficient documentation

## 2021-06-30 DIAGNOSIS — C9210 Chronic myeloid leukemia, BCR/ABL-positive, not having achieved remission: Secondary | ICD-10-CM | POA: Diagnosis not present

## 2021-06-30 DIAGNOSIS — Z8349 Family history of other endocrine, nutritional and metabolic diseases: Secondary | ICD-10-CM | POA: Diagnosis not present

## 2021-06-30 DIAGNOSIS — Z836 Family history of other diseases of the respiratory system: Secondary | ICD-10-CM | POA: Diagnosis not present

## 2021-06-30 DIAGNOSIS — Z801 Family history of malignant neoplasm of trachea, bronchus and lung: Secondary | ICD-10-CM | POA: Insufficient documentation

## 2021-06-30 DIAGNOSIS — Z882 Allergy status to sulfonamides status: Secondary | ICD-10-CM | POA: Diagnosis not present

## 2021-06-30 DIAGNOSIS — R5383 Other fatigue: Secondary | ICD-10-CM | POA: Diagnosis not present

## 2021-06-30 DIAGNOSIS — M79605 Pain in left leg: Secondary | ICD-10-CM | POA: Insufficient documentation

## 2021-06-30 DIAGNOSIS — Z883 Allergy status to other anti-infective agents status: Secondary | ICD-10-CM | POA: Diagnosis not present

## 2021-06-30 DIAGNOSIS — Z823 Family history of stroke: Secondary | ICD-10-CM | POA: Insufficient documentation

## 2021-06-30 DIAGNOSIS — Z9049 Acquired absence of other specified parts of digestive tract: Secondary | ICD-10-CM | POA: Diagnosis not present

## 2021-06-30 DIAGNOSIS — F419 Anxiety disorder, unspecified: Secondary | ICD-10-CM | POA: Insufficient documentation

## 2021-06-30 DIAGNOSIS — E782 Mixed hyperlipidemia: Secondary | ICD-10-CM | POA: Diagnosis not present

## 2021-06-30 DIAGNOSIS — E039 Hypothyroidism, unspecified: Secondary | ICD-10-CM | POA: Insufficient documentation

## 2021-06-30 DIAGNOSIS — Z87440 Personal history of urinary (tract) infections: Secondary | ICD-10-CM | POA: Diagnosis not present

## 2021-06-30 DIAGNOSIS — Z885 Allergy status to narcotic agent status: Secondary | ICD-10-CM | POA: Diagnosis not present

## 2021-06-30 LAB — CBC WITH DIFFERENTIAL/PLATELET
Abs Immature Granulocytes: 0.05 10*3/uL (ref 0.00–0.07)
Basophils Absolute: 0 10*3/uL (ref 0.0–0.1)
Basophils Relative: 0 %
Eosinophils Absolute: 0.2 10*3/uL (ref 0.0–0.5)
Eosinophils Relative: 2 %
HCT: 37.1 % (ref 36.0–46.0)
Hemoglobin: 11.5 g/dL — ABNORMAL LOW (ref 12.0–15.0)
Immature Granulocytes: 1 %
Lymphocytes Relative: 14 %
Lymphs Abs: 1.4 10*3/uL (ref 0.7–4.0)
MCH: 28 pg (ref 26.0–34.0)
MCHC: 31 g/dL (ref 30.0–36.0)
MCV: 90.5 fL (ref 80.0–100.0)
Monocytes Absolute: 0.7 10*3/uL (ref 0.1–1.0)
Monocytes Relative: 7 %
Neutro Abs: 7.9 10*3/uL — ABNORMAL HIGH (ref 1.7–7.7)
Neutrophils Relative %: 76 %
Platelets: 275 10*3/uL (ref 150–400)
RBC: 4.1 MIL/uL (ref 3.87–5.11)
RDW: 13.8 % (ref 11.5–15.5)
WBC: 10.3 10*3/uL (ref 4.0–10.5)
nRBC: 0 % (ref 0.0–0.2)

## 2021-06-30 LAB — COMPREHENSIVE METABOLIC PANEL
ALT: 27 U/L (ref 0–44)
AST: 35 U/L (ref 15–41)
Albumin: 4 g/dL (ref 3.5–5.0)
Alkaline Phosphatase: 192 U/L — ABNORMAL HIGH (ref 38–126)
Anion gap: 9 (ref 5–15)
BUN: 24 mg/dL — ABNORMAL HIGH (ref 8–23)
CO2: 25 mmol/L (ref 22–32)
Calcium: 8.8 mg/dL — ABNORMAL LOW (ref 8.9–10.3)
Chloride: 103 mmol/L (ref 98–111)
Creatinine, Ser: 0.98 mg/dL (ref 0.44–1.00)
GFR, Estimated: 60 mL/min — ABNORMAL LOW (ref >60)
Glucose, Bld: 118 mg/dL — ABNORMAL HIGH (ref 70–99)
Potassium: 4.6 mmol/L (ref 3.5–5.1)
Sodium: 137 mmol/L (ref 135–145)
Total Bilirubin: 1.1 mg/dL (ref 0.3–1.2)
Total Protein: 7.8 g/dL (ref 6.5–8.1)

## 2021-07-07 ENCOUNTER — Other Ambulatory Visit: Payer: Self-pay

## 2021-07-07 ENCOUNTER — Encounter (HOSPITAL_COMMUNITY): Payer: Self-pay | Admitting: Hematology and Oncology

## 2021-07-07 ENCOUNTER — Inpatient Hospital Stay (HOSPITAL_BASED_OUTPATIENT_CLINIC_OR_DEPARTMENT_OTHER): Payer: Medicare Other | Admitting: Hematology and Oncology

## 2021-07-07 VITALS — BP 154/57 | HR 83 | Temp 98.3°F | Resp 16 | Wt 200.2 lb

## 2021-07-07 DIAGNOSIS — C9210 Chronic myeloid leukemia, BCR/ABL-positive, not having achieved remission: Secondary | ICD-10-CM

## 2021-07-07 DIAGNOSIS — Z9049 Acquired absence of other specified parts of digestive tract: Secondary | ICD-10-CM | POA: Diagnosis not present

## 2021-07-07 DIAGNOSIS — M79604 Pain in right leg: Secondary | ICD-10-CM | POA: Diagnosis not present

## 2021-07-07 DIAGNOSIS — Z87440 Personal history of urinary (tract) infections: Secondary | ICD-10-CM | POA: Diagnosis not present

## 2021-07-07 DIAGNOSIS — M7989 Other specified soft tissue disorders: Secondary | ICD-10-CM | POA: Diagnosis not present

## 2021-07-07 DIAGNOSIS — Z801 Family history of malignant neoplasm of trachea, bronchus and lung: Secondary | ICD-10-CM | POA: Diagnosis not present

## 2021-07-07 DIAGNOSIS — Z885 Allergy status to narcotic agent status: Secondary | ICD-10-CM | POA: Diagnosis not present

## 2021-07-07 DIAGNOSIS — Z882 Allergy status to sulfonamides status: Secondary | ICD-10-CM | POA: Diagnosis not present

## 2021-07-07 DIAGNOSIS — E039 Hypothyroidism, unspecified: Secondary | ICD-10-CM | POA: Diagnosis not present

## 2021-07-07 DIAGNOSIS — Z883 Allergy status to other anti-infective agents status: Secondary | ICD-10-CM | POA: Diagnosis not present

## 2021-07-07 DIAGNOSIS — M549 Dorsalgia, unspecified: Secondary | ICD-10-CM | POA: Diagnosis not present

## 2021-07-07 DIAGNOSIS — Z823 Family history of stroke: Secondary | ICD-10-CM | POA: Diagnosis not present

## 2021-07-07 DIAGNOSIS — Z836 Family history of other diseases of the respiratory system: Secondary | ICD-10-CM | POA: Diagnosis not present

## 2021-07-07 DIAGNOSIS — E782 Mixed hyperlipidemia: Secondary | ICD-10-CM | POA: Diagnosis not present

## 2021-07-07 DIAGNOSIS — R5383 Other fatigue: Secondary | ICD-10-CM | POA: Diagnosis not present

## 2021-07-07 DIAGNOSIS — Z79899 Other long term (current) drug therapy: Secondary | ICD-10-CM | POA: Diagnosis not present

## 2021-07-07 DIAGNOSIS — M79605 Pain in left leg: Secondary | ICD-10-CM | POA: Diagnosis not present

## 2021-07-07 DIAGNOSIS — Z8349 Family history of other endocrine, nutritional and metabolic diseases: Secondary | ICD-10-CM | POA: Diagnosis not present

## 2021-07-07 NOTE — Progress Notes (Signed)
Andrea Martinez, [place] [address]   CLINIC:  Medical Oncology/Hematology  PCP:  Andrea Martinez [id number]  [phone]  REASON FOR VISIT:  Follow-up for [facility]  PRIOR THERAPY: None  CURRENT THERAPY: Tasigna  INTERVAL HISTORY:  [name], a 75 y.o. female, returns for routine follow-up for her CML. Andrea Martinez was last seen on 12/31/2020.  On exam today Mrs. Andrea Martinez reports that in the interim since her last visit she has had 2 back surgeries and that her back is now "the best part of her".  She notes that she continues to take the Tasigna twice daily 300 mg daily without difficulty.  She denies having any nausea, vomiting, or diarrhea.  She endorses no fevers or chills but does note that she "sweats all day and night".  She notes that she is also having difficulty with urinary incontinence for which she is following with urology.  She does endorse having fatigue but otherwise a full 10 point ROS was negative.   REVIEW OF SYSTEMS:  Review of Systems  Constitutional:  Positive for appetite change (moderately decreased) and fatigue (severe).  Cardiovascular:  Positive for leg swelling.  Musculoskeletal:  Positive for back pain (10/10 back and legs pain).  Psychiatric/Behavioral:  The patient is nervous/anxious.   All other systems reviewed and are negative.  PAST MEDICAL/SURGICAL HISTORY:  Past Medical History:  Diagnosis Date   Anxiety    denies   Arthritis    Chronic myelogenous leukemia (CML), BCR-ABL1-positive ([place]) 11/20/2015   Gastric ulcer 2011   EGD, 5/11   History of peptic ulcer disease    Hypothyroidism    not on meds, followed by Dr. Elyse Hsu   Lipoma    left upper arm   Mixed hyperlipidemia    Osteoporosis    Pericardial effusion    a. HCAP complicated by pericardial effusion requiring pericardial window 10/2016 and large L pleural effusion requring VATS.   Pleural effusion    a. s/p VATS  2017.   Pneumonia 10/2016   PONV (postoperative nausea and vomiting)    history of   Prolonged QT interval    Urine incontinence    UTI (lower urinary tract infection)    frequent   Vitamin D deficiency    Past Surgical History:  Procedure Laterality Date   BRAVO [place] STUDY  12/04/2012   Procedure: BRAVO [place];  Surgeon: [name], MD;  [facility];  Service: Endoscopy;  Laterality: N/A;   CHOLECYSTECTOMY N/A 01/03/2014   Procedure: LAPAROSCOPIC CHOLECYSTECTOMY WITH INTRAOPERATIVE CHOLANGIOGRAM;  Surgeon: Joyice Faster. Cornett, MD;  Location: [place];  Service: General;  Laterality: N/A;   COLONOSCOPY  01/2004   DR [facility], few small tics   ESOPHAGOGASTRODUODENOSCOPY  02/2010   gastric ulcers   ESOPHAGOGASTRODUODENOSCOPY  12/04/2012   [id number] gastritis (inflammation) was found in the gastric antrum; multiple biopsies The duodenal mucosa showed no abnormalities in the bulb and second portion of the duodenum   JOINT REPLACEMENT Right 10/22/2019   KYPHOPLASTY N/A 02/14/2019   Procedure: KYPHOPLASTY T12 and L1;  Surgeon: Melina Schools, MD;  Location: [place];  Service: Orthopedics;  Laterality: N/A;  120 mins   LIPOMA EXCISION  08/02/2011   left shoulder   NOSE SURGERY     PARTIAL HYSTERECTOMY     vaginal at age 19 years of age   TOE 65 Left 1962   lt  great toe   TOTAL KNEE ARTHROPLASTY Right 10/22/2019   Procedure: TOTAL KNEE ARTHROPLASTY;  Surgeon: Gaynelle Arabian, MD;  [facility];  Service: Orthopedics;  Laterality: Right;  64mn   TOTAL KNEE ARTHROPLASTY Left 03/03/2020   Procedure: TOTAL KNEE ARTHROPLASTY;  Surgeon: AGaynelle Arabian MD;  [facility];  Service: Orthopedics;  Laterality: Left;  520m   TRANSFORAMINAL LUMBAR INTERBODY FUSION (TLIF) WITH PEDICLE SCREW FIXATION 1 LEVEL N/A 01/28/2021   Procedure: TRANSFORAMINAL LUMBAR INTERBODY FUSION (TLIF) LUMBAR FIVE-SACRAL ONE;  Surgeon: BrMelina SchoolsMD;  [facility] Service:  Orth[facility]ics;  Laterality: N/A;  4 hrs   VIDEO ASSISTED THORACOSCOPY (VATS)/EMPYEMA Left 10/25/2016   Procedure: VIDEO ASSISTED THORACOSCOPY (VATS), BRONCH,DRAINAGE OF PLEURAL EFFUSION,PERICARDIAL WINDOW WITH DRAINAGE OF PERICARDIAL FLUID, TEE;  Surgeon: StMelrose NakayamaMD;  [place] Service: Thoracic;  Laterality: Left;   VIDEO BRONCHOSCOPY N/A 10/25/2016   Procedure: VIDEO BRONCHOSCOPY;  Surgeon: StMelrose NakayamaMD;  [place] [facility] Service: Thoracic;  Laterality: N/A;    SOCIAL HISTORY:  Social History   Socioeconomic History   Marital status: Married    Spouse name: Not on file   Number of children: 2   Years of education: 12th grade   Highest education level: Not on file  Occupational History   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[place]  Employer: RETIRED  Tobacco Use   Smoking status: Never   Smokeless tobacco: Never  Vaping Use   Vaping Use: Never used  Substance and Sexual Activity   Alcohol use: No    Alcohol/week: 0.0 standard drinks   Drug use: No   Sexual activity: Yes    Partners: Male    Birth control/protection: None  Other Topics Concern   Not on file  Social History Narrative   Not on file   Social Determinants of Health   Financial Resource Strain: Low Risk    Difficulty of Paying Living Expenses: Not hard at all  Food Insecurity: No Food Insecurity   Worried About RuCharity fundraisern the Last Year: Never true   RaArabin the Last Year: Never true  Transportation Needs: No Transportation Needs   Lack of Transportation (Medical): No   Lack of Transportation (Non-Medical): No  Physical Activity: Sufficiently Active   Days of Exercise per Week: 7 days   Minutes of Exercise per Session: 30 min  Stress: No Stress Concern Present   Feeling of Stress : Only a little  Social Connections: SoEngineer, building servicesf Communication with Friends and Family: More than three times a week   Frequency of Social  Gatherings with Friends and Family: More than three times a week   Attends Religious Services: More than 4 times per year   Active Member of [employer] Organizations: Yes   Attends ClArchivisteetings: 1 to 4 times per year   Marital Status: Married  InHuman resources officeriolence: Not on file    FAMILY HISTORY:  Family History  Problem Relation Age of Onset   Lung cancer Father    COPD Mother    Stroke Mother    Thyroid disease Mother    Colon cancer Neg Hx    Anesthesia problems Neg Hx    Hypotension Neg Hx    Malignant hyperthermia Neg Hx    Pseudochol deficiency Neg Hx     CURRENT MEDICATIONS:  Current Outpatient Medications  Medication Sig Dispense Refill  Cholecalciferol (VITAMIN D3) 50 MCG (2000 UT) TABS Take 1 tablet by mouth daily in the afternoon.     Cyanocobalamin (VITAMIN B-12 PO) Take 1 tablet by mouth daily in the afternoon.     escitalopram (LEXAPRO) 20 MG tablet Take 1.5 tablets (30 mg total) by mouth daily. 135 tablet 1   levothyroxine (SYNTHROID) 112 MCG tablet Take 1 tablet (112 mcg total) by mouth daily before breakfast. 90 tablet 1   Multiple Vitamins-Minerals (CENTRUM SILVER PO) Take 1 tablet by mouth daily in the afternoon.     nilotinib (TASIGNA) 150 MG capsule Take 2 capsules (300 mg total) by mouth every 12 (twelve) hours. 120 capsule 6   Probiotic Product (PROBIOTIC DAILY PO) Take 2 tablets by mouth daily in the afternoon.     Vitamin E 450 MG (1000 UT) CAPS Take 1 capsule by mouth daily in the afternoon.     No current facility-administered medications for this visit.    ALLERGIES:  Allergies  Allergen Reactions   Nucynta [Tapentadol Hcl] Swelling   Codeine Nausea Only and Other (See Comments)    Headache   Sulfonamide Derivatives Nausea Only and Other (See Comments)    Headache   Esomeprazole Magnesium Nausea Only   Fluconazole Rash    Redness and blistering on left thigh    PHYSICAL EXAM:  Performance status (ECOG): 2 -  Symptomatic, <50% confined to bed  Vitals:   07/07/21 1539  BP: (!) 154/57  Pulse: 83  Resp: 16  Temp: 98.3 F (36.8 C)  SpO2: 97%   Wt Readings from Last 3 Encounters:  07/07/21 200 lb 3.2 oz (90.8 kg)  06/03/21 198 lb 3.2 oz (89.9 kg)  05/12/21 200 lb 3.2 oz (90.8 kg)   Physical Exam Vitals reviewed.  Constitutional:      Appearance: Normal appearance. She is obese.  Cardiovascular:     Rate and Rhythm: Normal rate and regular rhythm.     Pulses: Normal pulses.     Heart sounds: Normal heart sounds.  Pulmonary:     Effort: Pulmonary effort is normal.     Breath sounds: Normal breath sounds.  Musculoskeletal:     Right lower leg: Edema (1+) present.     Left lower leg: Edema (1+) present.  Neurological:     General: No focal deficit present.     Mental Status: She is alert and oriented to person, place, and time.  Psychiatric:        Mood and Affect: Mood is anxious.        Behavior: Behavior normal.    LABORATORY DATA:  I have reviewed the labs as listed.  CBC Latest Ref Rng & Units 06/30/2021 01/26/2021 12/24/2020  WBC 4.0 - 10.5 K/uL 10.3 10.3 9.1  Hemoglobin 12.0 - 15.0 g/dL 11.5(L) 11.6(L) 10.9(L)  Hematocrit 36.0 - 46.0 % 37.1 38.7 35.5(L)  Platelets 150 - 400 K/uL 275 305 278   CMP Latest Ref Rng & Units 06/30/2021 01/26/2021 12/24/2020  Glucose 70 - 99 mg/dL 118(H) 104(H) 104(H)  BUN 8 - 23 mg/dL 24(H) 18 18  Creatinine 0.44 - 1.00 mg/dL 0.98 0.93 0.85  Sodium 135 - 145 mmol/L 137 140 138  Potassium 3.5 - 5.1 mmol/L 4.6 4.3 4.4  Chloride 98 - 111 mmol/L 103 107 105  CO2 22 - 32 mmol/L '25 23 25  '$ Calcium 8.9 - 10.3 mg/dL 8.8(L) 9.2 9.1  Total Protein 6.5 - 8.1 g/dL 7.8 - 7.2  Total Bilirubin 0.3 - 1.2 mg/dL  1.1 - 0.8  Alkaline Phos 38 - 126 U/L 192(H) - 138(H)  AST 15 - 41 U/L 35 - 28  ALT 0 - 44 U/L 27 - 15      Component Value Date/Time   RBC 4.10 06/30/2021 0918   MCV 90.5 06/30/2021 0918   MCH 28.0 06/30/2021 0918   MCHC 31.0 06/30/2021 0918    RDW 13.8 06/30/2021 0918   LYMPHSABS 1.4 06/30/2021 0918   MONOABS 0.7 06/30/2021 0918   EOSABS 0.2 06/30/2021 0918   BASOSABS 0.0 06/30/2021 0918    DIAGNOSTIC IMAGING:  I have independently reviewed the scans and discussed with the patient. No results found.   ASSESSMENT:  1.  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 -Left pleural effusion and pericardial effusion requiring pericardial window on 10/25/2016.  Fluid cytology negative. -Started back on Nilotinib 300 mg every 12 hours on 12/06/2016.   2.  Daytime sweats: -He had a long history of daytime sweats.  She had extensive work-up including a CT scan CAP, which showed no evidence of malignancy.  Hepatic morphology suspicious for cirrhosis.   PLAN:  1.  CML in chronic phase: -She is tolerating Nilotinib 300 mg twice daily very well. -Reviewed labs from 06/30/2021.  White count is mildly elevated at 10.3.  Hemoglobin and platelets are stable from prior -BCR/ABL by quantitative PCR on 12/31/2020 was undetectable. -Recommend follow-up in 6 months with repeat BCR/ABL and CBC. -Continue 19-monthfollow-ups.   2.  Low back pain: -She had developed back pain since she had both knees replaced. --currently much improved  Orders placed this encounter:  Orders Placed This Encounter  Procedures   CBC with Differential   Comprehensive metabolic panel   BCR-ABL1, CML/ALL, PCR, QUANT   JLedell Peoples MD Department of Hematology/Oncology CBeeat [facility]uttgartPhone: [phone]: [phone]: [email or web address]

## 2021-07-08 LAB — BCR-ABL1, CML/ALL, PCR, QUANT: Interpretation (BCRAL):: NEGATIVE

## 2021-08-07 DIAGNOSIS — Z981 Arthrodesis status: Secondary | ICD-10-CM | POA: Diagnosis not present

## 2021-08-07 DIAGNOSIS — M1611 Unilateral primary osteoarthritis, right hip: Secondary | ICD-10-CM | POA: Diagnosis not present

## 2021-08-07 DIAGNOSIS — Z4889 Encounter for other specified surgical aftercare: Secondary | ICD-10-CM | POA: Diagnosis not present

## 2021-08-10 ENCOUNTER — Other Ambulatory Visit: Payer: Self-pay | Admitting: Family Medicine

## 2021-08-10 DIAGNOSIS — I700 Atherosclerosis of aorta: Secondary | ICD-10-CM

## 2021-08-10 DIAGNOSIS — E782 Mixed hyperlipidemia: Secondary | ICD-10-CM

## 2021-08-12 DIAGNOSIS — M25551 Pain in right hip: Secondary | ICD-10-CM | POA: Diagnosis not present

## 2021-08-20 ENCOUNTER — Other Ambulatory Visit (HOSPITAL_COMMUNITY): Payer: Self-pay | Admitting: Occupational Therapy

## 2021-08-20 DIAGNOSIS — Z1231 Encounter for screening mammogram for malignant neoplasm of breast: Secondary | ICD-10-CM

## 2021-09-01 ENCOUNTER — Telehealth: Payer: Self-pay | Admitting: Family Medicine

## 2021-09-01 NOTE — Telephone Encounter (Signed)
Lmtcb- what side effects? Is she still on it ?

## 2021-09-01 NOTE — Telephone Encounter (Signed)
Lmtcb.

## 2021-09-03 ENCOUNTER — Ambulatory Visit (HOSPITAL_COMMUNITY)
Admission: RE | Admit: 2021-09-03 | Discharge: 2021-09-03 | Disposition: A | Discharge status: Home / Self Care | Payer: Medicare Other | Source: Ambulatory Visit | Attending: Occupational Therapy | Admitting: Occupational Therapy

## 2021-09-03 ENCOUNTER — Other Ambulatory Visit: Payer: Self-pay

## 2021-09-03 DIAGNOSIS — Z1231 Encounter for screening mammogram for malignant neoplasm of breast: Secondary | ICD-10-CM | POA: Diagnosis not present

## 2021-09-11 ENCOUNTER — Ambulatory Visit: Payer: Medicare Other | Admitting: Family Medicine

## 2021-09-17 ENCOUNTER — Other Ambulatory Visit: Payer: Self-pay | Admitting: Family Medicine

## 2021-09-17 DIAGNOSIS — F419 Anxiety disorder, unspecified: Secondary | ICD-10-CM

## 2021-09-24 ENCOUNTER — Encounter: Payer: Self-pay | Admitting: Family Medicine

## 2021-09-24 ENCOUNTER — Other Ambulatory Visit: Payer: Self-pay

## 2021-09-24 ENCOUNTER — Ambulatory Visit (INDEPENDENT_AMBULATORY_CARE_PROVIDER_SITE_OTHER): Payer: Medicare Other | Admitting: Family Medicine

## 2021-09-24 VITALS — BP 121/74 | HR 72 | Temp 97.0°F | Ht 65.0 in | Wt 200.2 lb

## 2021-09-24 DIAGNOSIS — E782 Mixed hyperlipidemia: Secondary | ICD-10-CM

## 2021-09-24 DIAGNOSIS — N8110 Cystocele, unspecified: Secondary | ICD-10-CM

## 2021-09-24 DIAGNOSIS — Z23 Encounter for immunization: Secondary | ICD-10-CM

## 2021-09-24 DIAGNOSIS — F419 Anxiety disorder, unspecified: Secondary | ICD-10-CM

## 2021-09-24 DIAGNOSIS — I700 Atherosclerosis of aorta: Secondary | ICD-10-CM

## 2021-09-24 MED ORDER — ATORVASTATIN CALCIUM 10 MG PO TABS: 10.0000 mg | ORAL_TABLET | Freq: Every day | ORAL | 2 refills | Status: DC

## 2021-09-24 NOTE — Progress Notes (Signed)
Assessment & Plan:  1-2. Mixed hyperlipidemia/Atherosclerosis of aorta [facility]) Patient is willing to retry atorvastatin in the evening. Education provided on high cholesterol. - atorvastatin (LIPITOR) 10 MG tablet; Take 1 tablet (10 mg total) by mouth daily.  Dispense: 30 tablet; Refill: 2  3. Anxiety Well controlled on current regimen.   4. Bladder prolapse, female, acquired Will refer somewhere else when patient is ready.  5. Need for immunization against influenza - Flu Vaccine QUAD High Dose(Fluad)   Return in about 3 months (around 12/25/2021) for annual physical.  Lucile Crater, NP Student  I personally was present during the history, physical exam, and medical decision-making activities of this service and have verified that the service and findings are accurately documented in the nurse practitioner student's note.  Hendricks Limes, MSN, APRN, FNP-C [facility]   Subjective:    Patient ID: Andrea Martinez, female    DOB: 08-15-1946, 75 y.o.   MRN: [id number]  Patient Care Team: Loman Brooklyn, FNP as PCP - General (Family Medicine) Derek Jack, MD as Medical Oncologist (Medical Oncology) Cassandria Anger, MD as Consulting Physician (Endocrinology) Melina Schools, MD as Consulting Physician (Orthopedic Surgery)   Chief Complaint:  Chief Complaint  Patient presents with   Anxiety   Hypothyroidism    4 month follow up     HPI: Andrea Martinez is a 75 y.o. female presenting on 09/24/2021 for Anxiety and Hypothyroidism (4 month follow up )  Hyperlipidemia: A statin was recommended after patient's last lipid panel and she was initiated on atorvastatin 10 mg and she experienced nausea, so she stopped taking this. She was taking it in the morning after taking her synthroid and she is concerned they are interacting with each other. She states she is willing to take it again if there is a different time she could take it.   The 10-year  ASCVD risk score (Arnett DK, et al., 2019) is: 16.2%   Values used to calculate the score:     Age: 48 years     Sex: Female     Is Non-Hispanic African American: No     Diabetic: No     Tobacco smoker: No     Systolic Blood Pressure: 497 mmHg     Is BP treated: No     HDL Cholesterol: 60 mg/dL     Total Cholesterol: 206 mg/dL   Anxiety: doing well with Lexapro.  GAD 7 : Generalized Anxiety Score 09/24/2021 05/12/2021 01/09/2021 09/11/2020  Nervous, Anxious, on Edge 0 1 0 0  Control/stop worrying 1 0 1 2  Worry too much - different things 0 0 0 2  Trouble relaxing 0 1 0 1  Restless 0 0 0 1  Easily annoyed or irritable 0 1 0 1  Afraid - awful might happen 0 1 0 0  Total GAD 7 Score 1 4 1 7   Anxiety Difficulty Not difficult at all Not difficult at all Not difficult at all -    Bladder prolapse: patient reports she did see the urologist and was not happy with them. States she cannot have surgery done at this time as she now as to have surgery on her hip, but that when she is ready to return she would like to be referred elsewhere.   Social history:  Relevant past medical, surgical, family and social history reviewed and updated as indicated. Interim medical history since our last visit reviewed.  Allergies and medications reviewed and updated.  DATA REVIEWED: CHART IN EPIC  ROS: Negative unless specifically indicated above in HPI.    Current Outpatient Medications:    Cholecalciferol (VITAMIN D3) 50 MCG (2000 UT) TABS, Take 1 tablet by mouth daily in the afternoon., Disp: , Rfl:    Cyanocobalamin (VITAMIN B-12 PO), Take 1 tablet by mouth daily in the afternoon., Disp: , Rfl:    escitalopram (LEXAPRO) 20 MG tablet, TAKE 1 AND 1/2 TABLETS DAILY BY MOUTH, Disp: 135 tablet, Rfl: 0   levothyroxine (SYNTHROID) 112 MCG tablet, Take 1 tablet (112 mcg total) by mouth daily before breakfast., Disp: 90 tablet, Rfl: 1   Multiple Vitamins-Minerals (CENTRUM SILVER PO), Take 1 tablet by  mouth daily in the afternoon., Disp: , Rfl:    nilotinib (TASIGNA) 150 MG capsule, Take 2 capsules (300 mg total) by mouth every 12 (twelve) hours., Disp: 120 capsule, Rfl: 6   Probiotic Product (PROBIOTIC DAILY PO), Take 2 tablets by mouth daily in the afternoon., Disp: , Rfl:    Vitamin E 450 MG (1000 UT) CAPS, Take 1 capsule by mouth daily in the afternoon., Disp: , Rfl:    Allergies  Allergen Reactions   Nucynta [Tapentadol Hcl] Swelling   Codeine Nausea Only and Other (See Comments)    Headache   Sulfonamide Derivatives Nausea Only and Other (See Comments)    Headache   Esomeprazole Magnesium Nausea Only   Fluconazole Rash    Redness and blistering on left thigh   Past Medical History:  Diagnosis Date   Anxiety    denies   Arthritis    Chronic myelogenous leukemia (CML), BCR-ABL1-positive ([place]) 11/20/2015   Gastric ulcer 2011   EGD, 5/11   History of peptic ulcer disease    Hypothyroidism    not on meds, followed by Dr. Elyse Hsu   Lipoma    left upper arm   Mixed hyperlipidemia    Osteoporosis    Pericardial effusion    a. HCAP complicated by pericardial effusion requiring pericardial window 10/2016 and large L pleural effusion requring VATS.   Pleural effusion    a. s/p VATS 2017.   Pneumonia 10/2016   PONV (postoperative nausea and vomiting)    history of   Prolonged QT interval    Urine incontinence    UTI (lower urinary tract infection)    frequent   Vitamin D deficiency     Past Surgical History:  Procedure Laterality Date   BRAVO [place] STUDY  12/04/2012   Procedure: BRAVO [place];  Surgeon: [name], MD;  [facility];  Service: Endoscopy;  Laterality: N/A;   CHOLECYSTECTOMY N/A 01/03/2014   Procedure: LAPAROSCOPIC CHOLECYSTECTOMY WITH INTRAOPERATIVE CHOLANGIOGRAM;  Surgeon: Joyice Faster. Cornett, MD;  Location: [place];  Service: General;  Laterality: N/A;   COLONOSCOPY  01/2004   DR [facility], few small tics    ESOPHAGOGASTRODUODENOSCOPY  02/2010   gastric ulcers   ESOPHAGOGASTRODUODENOSCOPY  12/04/2012   [id number] gastritis (inflammation) was found in the gastric antrum; multiple biopsies The duodenal mucosa showed no abnormalities in the bulb and second portion of the duodenum   JOINT REPLACEMENT Right 10/22/2019   KYPHOPLASTY N/A 02/14/2019   Procedure: KYPHOPLASTY T12 and L1;  Surgeon: Melina Schools, MD;  Location: [place];  Service: Orthopedics;  Laterality: N/A;  120 mins   LIPOMA EXCISION  08/02/2011   left shoulder   NOSE SURGERY     PARTIAL HYSTERECTOMY     vaginal at age 72 years of age  TOE DEBRIDEMENT Left 1962   lt great toe   TOTAL KNEE ARTHROPLASTY Right 10/22/2019   Procedure: TOTAL KNEE ARTHROPLASTY;  Surgeon: Gaynelle Arabian, MD;  [facility];  Service: Orthopedics;  Laterality: Right;  56min   TOTAL KNEE ARTHROPLASTY Left 03/03/2020   Procedure: TOTAL KNEE ARTHROPLASTY;  Surgeon: Gaynelle Arabian, MD;  [facility];  Service: Orthopedics;  Laterality: Left;  79min   TRANSFORAMINAL LUMBAR INTERBODY FUSION (TLIF) WITH PEDICLE SCREW FIXATION 1 LEVEL N/A 01/28/2021   Procedure: TRANSFORAMINAL LUMBAR INTERBODY FUSION (TLIF) LUMBAR FIVE-SACRAL ONE;  Surgeon: Melina Schools, MD;  Location: [place];  Service: Orthopedics;  Laterality: N/A;  4 hrs   VIDEO ASSISTED THORACOSCOPY (VATS)/EMPYEMA Left 10/25/2016   Procedure: VIDEO ASSISTED THORACOSCOPY (VATS), BRONCH,DRAINAGE OF PLEURAL EFFUSION,PERICARDIAL WINDOW WITH DRAINAGE OF PERICARDIAL FLUID, TEE;  Surgeon: [name], MD;  Location: [place];  Service: Thoracic;  Laterality: Left;   VIDEO BRONCHOSCOPY N/A 10/25/2016   Procedure: VIDEO BRONCHOSCOPY;  Surgeon: [name], MD;  Location: [place];  Service: Thoracic;  Laterality: N/A;    Social History   Socioeconomic History   Marital status: Married    Spouse name: Not on file   Number of children: 2   Years of education: 12th grade   Highest education level: Not on  file  Occupational History   Occupation: retired    Fish farm manager: [employer]    Employer: RETIRED  Tobacco Use   Smoking status: Never   Smokeless tobacco: Never  Vaping Use   Vaping Use: Never used  Substance and Sexual Activity   Alcohol use: No    Alcohol/week: 0.0 standard drinks   Drug use: No   Sexual activity: Yes    Partners: Male    Birth control/protection: None  Other Topics Concern   Not on file  Social History Narrative   Not on file   Social Determinants of Health   Financial Resource Strain: Low Risk    Difficulty of Paying Living Expenses: Not hard at all  Food Insecurity: No Food Insecurity   Worried About Charity fundraiser in the Last Year: Never true   [place] in the Last Year: Never true  Transportation Needs: No Transportation Needs   Lack of Transportation (Medical): No   Lack of Transportation (Non-Medical): No  Physical Activity: Sufficiently Active   Days of Exercise per Week: 7 days   Minutes of Exercise per Session: 30 min  Stress: No Stress Concern Present   Feeling of Stress : Only a little  Social Connections: Engineer, building services of Communication with Friends and Family: More than three times a week   Frequency of Social Gatherings with Friends and Family: More than three times a week   Attends Religious Services: More than 4 times per year   Active Member of [employer] or Organizations: Yes   Attends Archivist Meetings: 1 to 4 times per year   Marital Status: Married  Human resources officer Violence: Not on file        Objective:    BP 121/74   Pulse 72   Temp (!) 97 F (36.1 C) (Temporal)   Ht 5\' 5"  (1.651 m)   Wt 90.8 kg   SpO2 97%   BMI 33.32 kg/m   Wt Readings from Last 3 Encounters:  09/24/21 200 lb 3.2 oz (90.8 kg)  07/07/21 200 lb 3.2 oz (90.8 kg)  06/03/21 198 lb 3.2 oz (89.9 kg)    Physical  Exam Vitals reviewed.  Constitutional:      General: She is not in acute distress.    Appearance:  Normal appearance. She is obese. She is not ill-appearing, toxic-appearing or diaphoretic.  HENT:     Head: Normocephalic and atraumatic.  Eyes:     General: No scleral icterus.       Right eye: No discharge.        Left eye: No discharge.     Conjunctiva/sclera: Conjunctivae normal.  Cardiovascular:     Rate and Rhythm: Normal rate and regular rhythm.     Heart sounds: Normal heart sounds. No murmur heard.   No friction rub. No gallop.  Pulmonary:     Effort: Pulmonary effort is normal. No respiratory distress.     Breath sounds: Normal breath sounds. No stridor. No wheezing, rhonchi or rales.  Musculoskeletal:        General: Normal range of motion.     Cervical back: Normal range of motion.  Skin:    General: Skin is warm and dry.     Capillary Refill: Capillary refill takes less than 2 seconds.  Neurological:     General: No focal deficit present.     Mental Status: She is alert and oriented to person, place, and time. Mental status is at baseline.  Psychiatric:        Mood and Affect: Mood normal.        Behavior: Behavior normal.        Thought Content: Thought content normal.        Judgment: Judgment normal.    Lab Results  Component Value Date   TSH 0.101 (L) 05/26/2021   Lab Results  Component Value Date   WBC 10.3 06/30/2021   HGB 11.5 (L) 06/30/2021   HCT 37.1 06/30/2021   MCV 90.5 06/30/2021   PLT 275 06/30/2021   Lab Results  Component Value Date   NA 137 06/30/2021   K 4.6 06/30/2021   CO2 25 06/30/2021   GLUCOSE 118 (H) 06/30/2021   BUN 24 (H) 06/30/2021   CREATININE 0.98 06/30/2021   BILITOT 1.1 06/30/2021   ALKPHOS 192 (H) 06/30/2021   AST 35 06/30/2021   ALT 27 06/30/2021   PROT 7.8 06/30/2021   ALBUMIN 4.0 06/30/2021   CALCIUM 8.8 (L) 06/30/2021   ANIONGAP 9 06/30/2021   Lab Results  Component Value Date   CHOL 206 (H) 01/09/2021   Lab Results  Component Value Date   HDL 60 01/09/2021   Lab Results  Component Value Date    LDLCALC 125 (H) 01/09/2021   Lab Results  Component Value Date   TRIG 116 01/09/2021   Lab Results  Component Value Date   CHOLHDL 3.4 01/09/2021   Lab Results  Component Value Date   HGBA1C 5.3 04/24/2019

## 2021-09-25 DIAGNOSIS — M25551 Pain in right hip: Secondary | ICD-10-CM | POA: Diagnosis not present

## 2021-09-27 ENCOUNTER — Encounter: Payer: Self-pay | Admitting: Family Medicine

## 2021-09-28 DIAGNOSIS — E039 Hypothyroidism, unspecified: Secondary | ICD-10-CM | POA: Diagnosis not present

## 2021-09-29 LAB — TSH: TSH: 1.58 u[IU]/mL (ref 0.450–4.500)

## 2021-09-29 LAB — T4, FREE: Free T4: 1.23 ng/dL (ref 0.82–1.77)

## 2021-10-05 ENCOUNTER — Encounter: Payer: Self-pay | Admitting: "Endocrinology

## 2021-10-05 ENCOUNTER — Ambulatory Visit: Payer: Medicare Other | Admitting: "Endocrinology

## 2021-10-05 VITALS — BP 112/58 | HR 76 | Ht 65.0 in | Wt 203.4 lb

## 2021-10-05 DIAGNOSIS — E039 Hypothyroidism, unspecified: Secondary | ICD-10-CM

## 2021-10-05 MED ORDER — LEVOTHYROXINE SODIUM 112 MCG PO TABS: 112.0000 ug | ORAL_TABLET | Freq: Every day | ORAL | 3 refills | Status: DC

## 2021-10-05 NOTE — Progress Notes (Signed)
06/03/2021     Endocrinology follow-up note    Subjective:    Patient ID: Andrea Martinez, female    DOB: 05/07/1946, PCP Loman Brooklyn, FNP   Past Medical History:  Diagnosis Date   Anxiety    denies   Arthritis    Chronic myelogenous leukemia (CML), BCR-ABL1-positive ([place]) 11/20/2015   Gastric ulcer 2011   EGD, 5/11   History of peptic ulcer disease    Hypothyroidism    not on meds, followed by Dr. Elyse Hsu   Lipoma    left upper arm   Mixed hyperlipidemia    Osteoporosis    Pericardial effusion    a. HCAP complicated by pericardial effusion requiring pericardial window 10/2016 and large L pleural effusion requring VATS.   Pleural effusion    a. s/p VATS 2017.   Pneumonia 10/2016   PONV (postoperative nausea and vomiting)    history of   Prolonged QT interval    Urine incontinence    UTI (lower urinary tract infection)    frequent   Vitamin D deficiency    Past Surgical History:  Procedure Laterality Date   BRAVO [place] STUDY  12/04/2012   Procedure: BRAVO [place];  Surgeon: [name], MD;  [facility];  Service: Endoscopy;  Laterality: N/A;   CHOLECYSTECTOMY N/A 01/03/2014   Procedure: LAPAROSCOPIC CHOLECYSTECTOMY WITH INTRAOPERATIVE CHOLANGIOGRAM;  Surgeon: Joyice Faster. Cornett, MD;  Location: [place];  Service: General;  Laterality: N/A;   COLONOSCOPY  01/2004   DR [facility], few small tics   ESOPHAGOGASTRODUODENOSCOPY  02/2010   gastric ulcers   ESOPHAGOGASTRODUODENOSCOPY  12/04/2012   [id number] gastritis (inflammation) was found in the gastric antrum; multiple biopsies The duodenal mucosa showed no abnormalities in the bulb and second portion of the duodenum   JOINT REPLACEMENT Right 10/22/2019   KYPHOPLASTY N/A 02/14/2019   Procedure: KYPHOPLASTY T12 and L1;  Surgeon: Melina Schools, MD;  Location: [place];  Service: Orthopedics;  Laterality: N/A;  120 mins   LIPOMA EXCISION  08/02/2011   left shoulder   NOSE SURGERY     PARTIAL  HYSTERECTOMY     vaginal at age 33 years of age   TOE 59 Left 1962   lt great toe   TOTAL KNEE ARTHROPLASTY Right 10/22/2019   Procedure: TOTAL KNEE ARTHROPLASTY;  Surgeon: Gaynelle Arabian, MD;  [facility];  Service: Orthopedics;  Laterality: Right;  14min   TOTAL KNEE ARTHROPLASTY Left 03/03/2020   Procedure: TOTAL KNEE ARTHROPLASTY;  Surgeon: Gaynelle Arabian, MD;  [facility];  Service: Orthopedics;  Laterality: Left;  79min   TRANSFORAMINAL LUMBAR INTERBODY FUSION (TLIF) WITH PEDICLE SCREW FIXATION 1 LEVEL N/A 01/28/2021   Procedure: TRANSFORAMINAL LUMBAR INTERBODY FUSION (TLIF) LUMBAR FIVE-SACRAL ONE;  Surgeon: Melina Schools, MD;  Location: [place];  Service: Orthopedics;  Laterality: N/A;  4 hrs   VIDEO ASSISTED THORACOSCOPY (VATS)/EMPYEMA Left 10/25/2016   Procedure: VIDEO ASSISTED THORACOSCOPY (VATS), BRONCH,DRAINAGE OF PLEURAL EFFUSION,PERICARDIAL WINDOW WITH DRAINAGE OF PERICARDIAL FLUID, TEE;  Surgeon: [name], MD;  Location: [place];  Service: Thoracic;  Laterality: Left;   VIDEO BRONCHOSCOPY N/A 10/25/2016   Procedure: VIDEO BRONCHOSCOPY;  Surgeon: [name], MD;  Location: [place];  Service: Thoracic;  Laterality: N/A;   Social History   Socioeconomic History   Marital status: Married    Spouse name: Not on file   Number of children: 2   Years of education: 12th grade   Highest  education level: Not on file  Occupational History   Occupation: retired    Fish farm manager: [employer]    Employer: RETIRED  Tobacco Use   Smoking status: Never   Smokeless tobacco: Never  Vaping Use   Vaping Use: Never used  Substance and Sexual Activity   Alcohol use: No    Alcohol/week: 0.0 standard drinks   Drug use: No   Sexual activity: Yes    Partners: Male    Birth control/protection: None  Other Topics Concern   Not on file  Social History Narrative   Not on file   Social Determinants of Health   Financial Resource Strain: Low Risk    Difficulty of  Paying Living Expenses: Not hard at all  Food Insecurity: No Food Insecurity   Worried About Charity fundraiser in the Last Year: Never true   [place] in the Last Year: Never true  Transportation Needs: No Transportation Needs   Lack of Transportation (Medical): No   Lack of Transportation (Non-Medical): No  Physical Activity: Sufficiently Active   Days of Exercise per Week: 7 days   Minutes of Exercise per Session: 30 min  Stress: No Stress Concern Present   Feeling of Stress : Only a little  Social Connections: Engineer, building services of Communication with Friends and Family: More than three times a week   Frequency of Social Gatherings with Friends and Family: More than three times a week   Attends Religious Services: More than 4 times per year   Active Member of [employer] or Organizations: Yes   Attends Archivist Meetings: 1 to 4 times per year   Marital Status: Married   Outpatient Encounter Medications as of 06/03/2021  Medication Sig   Cholecalciferol (VITAMIN D3) 50 MCG (2000 UT) TABS Take 1 tablet by mouth daily in the afternoon.   Cyanocobalamin (VITAMIN B-12 PO) Take 1 tablet by mouth daily in the afternoon.   Multiple Vitamins-Minerals (CENTRUM SILVER PO) Take 1 tablet by mouth daily in the afternoon.   Probiotic Product (PROBIOTIC DAILY PO) Take 2 tablets by mouth daily in the afternoon.   Vitamin E 450 MG (1000 UT) CAPS Take 1 capsule by mouth daily in the afternoon.   atorvastatin (LIPITOR) 10 MG tablet Take 1 tablet (10 mg total) by mouth daily.   escitalopram (LEXAPRO) 20 MG tablet Take 1.5 tablets (30 mg total) by mouth daily.   levothyroxine (SYNTHROID) 112 MCG tablet Take 1 tablet (112 mcg total) by mouth daily before breakfast.   nilotinib (TASIGNA) 150 MG capsule Take 2 capsules (300 mg total) by mouth every 12 (twelve) hours.   [DISCONTINUED] levothyroxine (SYNTHROID) 125 MCG tablet Take 1 tablet (125 mcg total) by mouth daily before  breakfast.   No facility-administered encounter medications on file as of 06/03/2021.   ALLERGIES: Allergies  Allergen Reactions   Nucynta [Tapentadol Hcl] Swelling   Codeine Nausea Only and Other (See Comments)    Headache   Sulfonamide Derivatives Nausea Only and Other (See Comments)    Headache   Esomeprazole Magnesium Nausea Only   Fluconazole Rash    Redness and blistering on left thigh    VACCINATION STATUS: Immunization History  Administered Date(s) Administered   Fluad Quad(high Dose 65+) 09/11/2020   Influenza,inj,Quad PF,6+ Mos 09/13/2017   Influenza-Unspecified 09/24/2015, 11/24/2017   Moderna Sars-Covid-2 Vaccination 04/02/2020, 04/30/2020, 01/07/2021   Pneumococcal Conjugate-13 10/09/2014   Pneumococcal Polysaccharide-23 10/09/2014    HPI  75 year old female patient with  medical history as above.   She is being seen in follow-up for hypothyroidism.  She remains on levothyroxine 112 mcg p.o. daily before breakfast.   Her previsit labs are consistent with appropriate replacement.  No new complaints today.  She denies chest pain, shortness of breath. She denies palpitations, tremors, nor heat intolerance.   She is still on nilotinib for CML.    -She has family history of thyroid problem involving goiter which required thyroidectomy in her mother who took thyroid hormone replacement subsequently. -She denies dysphagia, shortness of breath, voice change. -She is compliant to her medications.  Review of Systems Limited as above.  Objective:    BP 126/67   Pulse 68   Ht 5\' 5"  (1.651 m)   Wt 198 lb 3.2 oz (89.9 kg)   BMI 32.98 kg/m   Wt Readings from Last 3 Encounters:  06/03/21 198 lb 3.2 oz (89.9 kg)  05/12/21 200 lb 3.2 oz (90.8 kg)  01/28/21 208 lb (94.3 kg)     CMP     Component Value Date/Time   NA 140 01/26/2021 1024   K 4.3 01/26/2021 1024   CL 107 01/26/2021 1024   CO2 23 01/26/2021 1024   GLUCOSE 104 (H) 01/26/2021 1024   BUN 18  01/26/2021 1024   CREATININE 0.93 01/26/2021 1024   CREATININE 0.87 07/14/2011 0938   CALCIUM 9.2 01/26/2021 1024   PROT 7.2 12/24/2020 1318   ALBUMIN 4.0 12/24/2020 1318   AST 28 12/24/2020 1318   ALT 15 12/24/2020 1318   ALKPHOS 138 (H) 12/24/2020 1318   BILITOT 0.8 12/24/2020 1318   GFRNONAA >60 01/26/2021 1024   GFRAA >60 08/21/2020 1340   Diabetic Labs (most recent): Lab Results  Component Value Date   HGBA1C 5.3 04/24/2019   HGBA1C 5.3 10/28/2016   Recent Results (from the past 2160 hour(s))  Urinalysis, Routine w reflex microscopic     Status: Abnormal   Collection Time: 05/12/21 10:46 AM  Result Value Ref Range   Specific Gravity, UA 1.015 1.005 - 1.030   pH, UA 5.0 5.0 - 7.5   Color, UA Yellow Yellow   Appearance Ur Clear Clear   Leukocytes,UA Trace (A) Negative   Protein,UA Negative Negative/Trace   Glucose, UA Negative Negative   Ketones, UA Negative Negative   RBC, UA Negative Negative   Bilirubin, UA Negative Negative   Urobilinogen, Ur 0.2 0.2 - 1.0 mg/dL   Nitrite, UA Negative Negative   Microscopic Examination See below:   Microscopic Examination     Status: None   Collection Time: 05/12/21 10:46 AM   Urine  Result Value Ref Range   WBC, UA 0-5 0 - 5 /hpf   RBC None seen 0 - 2 /hpf   Epithelial Cells (non renal) 0-10 0 - 10 /hpf   Bacteria, UA Few None seen/Few  T4, free     Status: None   Collection Time: 05/26/21  2:03 PM  Result Value Ref Range   Free T4 1.65 0.82 - 1.77 ng/dL  TSH     Status: Abnormal   Collection Time: 05/26/21  2:03 PM  Result Value Ref Range   TSH 0.101 (L) 0.450 - 4.500 uIU/mL      Assessment & Plan:   1. Hypothyroidism - Her thyroid function tests are consistent with appropriate replacement.  She is advised to continue levothyroxine 112 mcg p.o. daily before breakfast.     - We discussed about the correct intake of her thyroid hormone,  on empty stomach at fasting, with water, separated by at least 30 minutes from  breakfast and other medications,  and separated by more than 4 hours from calcium, iron, multivitamins, acid reflux medications (PPIs). -Patient is made aware of the fact that thyroid hormone replacement is needed for life, dose to be adjusted by periodic monitoring of thyroid function tests.   - Her clinical exam has been  negative for goiter, hence no need for imaging of the thyroid at this time. - I advised patient to maintain close follow up with Loman Brooklyn, FNP for primary care needs.   I spent 20 minutes in the care of the patient today including review of labs from Thyroid Function, CMP, and other relevant labs ; imaging/biopsy records (current and previous including abstractions from other facilities); face-to-face time discussing  her lab results and symptoms, medications doses, her options of short and long term treatment based on the latest standards of care / guidelines;   and documenting the encounter.  [name]  participated in the discussions, expressed understanding, and voiced agreement with the above plans.  All questions were answered to her satisfaction. she is encouraged to contact clinic should she have any questions or concerns prior to her return visit.   Follow up plan: Return in about 4 months (around 10/03/2021) for F/U with Pre-visit Labs.  Glade Lloyd, MD Phone: [phone]  Fax: [phone]  -  This note was partially dictated with voice recognition software. Similar sounding words can be transcribed inadequately or may not  be corrected upon review.  06/03/2021, 4:22 PM

## 2021-10-21 ENCOUNTER — Ambulatory Visit (INDEPENDENT_AMBULATORY_CARE_PROVIDER_SITE_OTHER): Payer: Medicare Other | Admitting: Family Medicine

## 2021-10-21 ENCOUNTER — Encounter: Payer: Self-pay | Admitting: Family Medicine

## 2021-10-21 DIAGNOSIS — R051 Acute cough: Secondary | ICD-10-CM | POA: Diagnosis not present

## 2021-10-21 DIAGNOSIS — J029 Acute pharyngitis, unspecified: Secondary | ICD-10-CM | POA: Diagnosis not present

## 2021-10-21 DIAGNOSIS — U071 COVID-19: Secondary | ICD-10-CM

## 2021-10-21 LAB — VERITOR FLU A/B WAIVED
Influenza A: NEGATIVE
Influenza B: NEGATIVE

## 2021-10-21 NOTE — Addendum Note (Signed)
Addended by: Reather Converse on: 10/21/2021 11:15 AM   Modules accepted: Orders

## 2021-10-21 NOTE — Progress Notes (Addendum)
Virtual Visit via Telephone Note  I connected with Andrea Martinez on 10/21/21 at 9:54 AM by telephone and verified that I am speaking with the correct person using two identifiers. Andrea Martinez is currently located at home and her husband is currently with her during this visit. The provider, Loman Brooklyn, FNP is located in their office at time of visit.  I discussed the limitations, risks, security and privacy concerns of performing an evaluation and management service by telephone and the availability of in person appointments. I also discussed with the patient that there may be a patient responsible charge related to this service. The patient expressed understanding and agreed to proceed.  Subjective: PCP: Loman Brooklyn, FNP  Chief Complaint  Patient presents with   Cough   Patient complains of cough, head congestion, headache, sore throat, facial pain/pressure, and postnasal drainage. Onset of symptoms was 1 day ago, gradually worsening since that time. She is drinking plenty of fluids. Evaluation to date: none. Treatment to date:  sinus pill OTC and Tylenol . She has a history of leukemia. She does not smoke.    ROS: Per HPI  Current Outpatient Medications:    atorvastatin (LIPITOR) 10 MG tablet, Take 1 tablet (10 mg total) by mouth daily., Disp: 30 tablet, Rfl: 2   Cholecalciferol (VITAMIN D3) 50 MCG (2000 UT) TABS, Take 1 tablet by mouth daily in the afternoon., Disp: , Rfl:    Cyanocobalamin (VITAMIN B-12 PO), Take 1 tablet by mouth daily in the afternoon., Disp: , Rfl:    escitalopram (LEXAPRO) 20 MG tablet, TAKE 1 AND 1/2 TABLETS DAILY BY MOUTH, Disp: 135 tablet, Rfl: 0   levothyroxine (SYNTHROID) 112 MCG tablet, Take 1 tablet (112 mcg total) by mouth daily before breakfast., Disp: 90 tablet, Rfl: 3   Multiple Vitamins-Minerals (CENTRUM SILVER PO), Take 1 tablet by mouth daily in the afternoon., Disp: , Rfl:    nilotinib (TASIGNA) 150 MG capsule, Take 2 capsules (300 mg  total) by mouth every 12 (twelve) hours., Disp: 120 capsule, Rfl: 6   Probiotic Product (PROBIOTIC DAILY PO), Take 2 tablets by mouth daily in the afternoon., Disp: , Rfl:    Vitamin E 450 MG (1000 UT) CAPS, Take 1 capsule by mouth daily in the afternoon., Disp: , Rfl:   Allergies  Allergen Reactions   Nucynta [Tapentadol Hcl] Swelling   Codeine Nausea Only and Other (See Comments)    Headache   Sulfonamide Derivatives Nausea Only and Other (See Comments)    Headache   Esomeprazole Magnesium Nausea Only   Fluconazole Rash    Redness and blistering on left thigh   Past Medical History:  Diagnosis Date   Anxiety    denies   Arthritis    Chronic myelogenous leukemia (CML), 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 Elyse Hsu   Lipoma    left upper arm   Mixed hyperlipidemia    Osteoporosis    Pericardial effusion    a. HCAP complicated by pericardial effusion requiring pericardial window 10/2016 and large L pleural effusion requring VATS.   Pleural effusion    a. s/p VATS 2017.   Pneumonia 10/2016   PONV (postoperative nausea and vomiting)    history of   Prolonged QT interval    Urine incontinence    UTI (lower urinary tract infection)  frequent   Vitamin D deficiency     Observations/Objective: A&O  No respiratory distress or wheezing audible over the phone Mood, judgement, and thought processes all WNL  Assessment and Plan: 1-2. Sore throat/Acute cough Discussed with patient that if influenza and COVID testing are negative I will give her an antibiotic since she does have leukemia.  - Novel Coronavirus, NAA ([facility]); Future - Veritor Flu A/B Waived; Future  Addendum 10/22/2021: + for COVID. Treated with molnupiravir.  Follow Up Instructions:  I discussed the assessment and treatment plan with the patient. The patient was provided an opportunity to  ask questions and all were answered. The patient agreed with the plan and demonstrated an understanding of the instructions.   The patient was advised to call back or seek an in-person evaluation if the symptoms worsen or if the condition fails to improve as anticipated.  The above assessment and management plan was discussed with the patient. The patient verbalized understanding of and has agreed to the management plan. Patient is aware to call the clinic if symptoms persist or worsen. Patient is aware when to return to the clinic for a follow-up visit. Patient educated on when it is appropriate to go to the emergency department.   Time call ended: 10:05 AM  I provided 11 minutes of non-face-to-face time during this encounter.  Hendricks Limes, MSN, APRN, FNP-C [facility] 10/21/21

## 2021-10-22 ENCOUNTER — Telehealth: Payer: Self-pay | Admitting: Family Medicine

## 2021-10-22 LAB — SARS-COV-2, NAA 2 DAY TAT

## 2021-10-22 LAB — NOVEL CORONAVIRUS, NAA: SARS-CoV-2, NAA: DETECTED — AB

## 2021-10-22 MED ORDER — MOLNUPIRAVIR EUA 200MG CAPSULE: 4.0000 | ORAL_CAPSULE | Freq: Two times a day (BID) | ORAL | 0 refills | Status: AC

## 2021-10-22 NOTE — Addendum Note (Signed)
Addended by: Loman Brooklyn on: 10/22/2021 04:47 PM   Modules accepted: Orders

## 2021-10-28 ENCOUNTER — Telehealth: Payer: Self-pay | Admitting: Family Medicine

## 2021-10-28 NOTE — Telephone Encounter (Signed)
Appointment scheduled.

## 2021-10-28 NOTE — Telephone Encounter (Signed)
When her symptoms per our office policy have resolved.

## 2021-10-30 ENCOUNTER — Other Ambulatory Visit: Payer: Self-pay

## 2021-10-30 ENCOUNTER — Encounter: Payer: Self-pay | Admitting: Family Medicine

## 2021-10-30 ENCOUNTER — Ambulatory Visit (INDEPENDENT_AMBULATORY_CARE_PROVIDER_SITE_OTHER): Payer: Medicare Other | Admitting: Family Medicine

## 2021-10-30 VITALS — BP 138/77 | HR 80 | Temp 97.3°F | Ht 65.0 in | Wt 203.0 lb

## 2021-10-30 DIAGNOSIS — U071 COVID-19: Secondary | ICD-10-CM

## 2021-10-30 DIAGNOSIS — Z8616 Personal history of COVID-19: Secondary | ICD-10-CM | POA: Diagnosis not present

## 2021-10-30 DIAGNOSIS — Z01818 Encounter for other preprocedural examination: Secondary | ICD-10-CM | POA: Diagnosis not present

## 2021-10-30 LAB — COAGUCHEK XS/INR WAIVED
INR: 1 (ref 0.9–1.1)
Prothrombin Time: 11.9 s

## 2021-10-30 LAB — MICROSCOPIC EXAMINATION
RBC, Urine: NONE SEEN /hpf (ref 0–2)
Renal Epithel, UA: NONE SEEN /hpf

## 2021-10-30 LAB — URINALYSIS, ROUTINE W REFLEX MICROSCOPIC
Bilirubin, UA: NEGATIVE
Glucose, UA: NEGATIVE
Nitrite, UA: NEGATIVE
Protein,UA: NEGATIVE
RBC, UA: NEGATIVE
Specific Gravity, UA: 1.02 (ref 1.005–1.030)
Urobilinogen, Ur: 0.2 mg/dL (ref 0.2–1.0)
pH, UA: 5.5 (ref 5.0–7.5)

## 2021-10-30 LAB — BAYER DCA HB A1C WAIVED: HB A1C (BAYER DCA - WAIVED): 5.1 % (ref 4.8–5.6)

## 2021-10-30 MED ORDER — PREDNISONE 20 MG PO TABS: 40.0000 mg | ORAL_TABLET | Freq: Every day | ORAL | 0 refills | Status: AC

## 2021-10-30 NOTE — Progress Notes (Signed)
Pt is a 75 y.o. female who is here for preoperative clearance for a right total hip arthroplasty.   1) High Risk Cardiac Conditions  1) Recent MI - No.  2) Decompensated Heart Failure - No.  3) Unstable angina - No.  4) Symptomatic arrythmia - No.  5) Sx Valvular Disease - No.  2) Intermediate Risk Factors - DM, CKD, CVA, CHF, CAD - Yes.  (Aortic atherosclerosis)  2) Functional Status - > 4 mets (Walk, run, climb stairs) Yes.  Andrea Martinez Activity Status Index: 18.95  3) Surgery Specific Risk - Intermediate  4) Further Noninvasive evaluation -   1) EKG - Yes.   Sinus Rhythm; Rate of 68.   1) Hx of CVA, CAD, DM, CKD  2) Echo - No.   1) Worsening dyspnea   3) Stress Testing - Active Cardiac Disease - No.  5) Need for medical therapy - Beta Blocker, Statins indicated ? Yes.    PE: Vitals:   10/30/21 1416  BP: 138/77  Pulse: 80  Temp: (!) 97.3 F (36.3 C)  SpO2: 98%   Physical Exam Vitals reviewed.  Constitutional:      General: She is not in acute distress.    Appearance: Normal appearance. She is obese. She is not ill-appearing, toxic-appearing or diaphoretic.  HENT:     Head: Normocephalic and atraumatic.     Right Ear: Tympanic membrane, ear canal and external ear normal. There is no impacted cerumen.     Left Ear: Tympanic membrane, ear canal and external ear normal. There is no impacted cerumen.     Nose: Nose normal. No congestion or rhinorrhea.     Mouth/Throat:     Mouth: Mucous membranes are moist.     Pharynx: Oropharynx is clear. No oropharyngeal exudate or posterior oropharyngeal erythema.  Eyes:     General: No scleral icterus.       Right eye: No discharge.        Left eye: No discharge.     Conjunctiva/sclera: Conjunctivae normal.     Pupils: Pupils are equal, round, and reactive to light.  Cardiovascular:     Rate and Rhythm: Normal rate and regular rhythm.     Heart sounds: Normal heart sounds. No murmur heard.   No friction rub. No gallop.   Pulmonary:     Effort: Pulmonary effort is normal. No respiratory distress.     Breath sounds: Normal breath sounds. No stridor. No wheezing, rhonchi or rales.  Abdominal:     General: Abdomen is flat. Bowel sounds are normal. There is no distension.     Palpations: Abdomen is soft. There is no hepatomegaly, splenomegaly or mass.     Tenderness: There is no abdominal tenderness. There is no guarding or rebound.     Hernia: No hernia is present.  Musculoskeletal:     Cervical back: Normal range of motion and neck supple. No rigidity. No muscular tenderness.     Right hip: Tenderness present. Decreased range of motion.  Lymphadenopathy:     Cervical: No cervical adenopathy.  Skin:    General: Skin is warm and dry.     Capillary Refill: Capillary refill takes less than 2 seconds.  Neurological:     General: No focal deficit present.     Mental Status: She is alert and oriented to person, place, and time. Mental status is at baseline.     Gait: Gait abnormal (ambulates with walker).  Psychiatric:  Mood and Affect: Mood normal.        Behavior: Behavior normal.        Thought Content: Thought content normal.        Judgment: Judgment normal.   1. Pre-op evaluation I have independently evaluated patient.  Andrea Martinez is a 75 y.o. female who is moderate risk for a intermediate risk surgery.  There are/ are not modifiable risk factors (smoking, etc). Andrea Martinez's RCRI/NSQIP calculation for MACE is: 0.    Will be cleared pending lab work. - CBC with Differential/Platelet - CMP14+EGFR - Urinalysis, Routine w reflex microscopic - Bayer DCA Hb A1c Waived - CoaguChek XS/INR Waived - EKG 12-Lead  2. COVID-19 Patient recently had COVID and cannot get rid of all the sinus congestion. Rx'd Prednisone.   Hendricks Limes, MSN, APRN, FNP-C [place]

## 2021-10-31 LAB — CBC WITH DIFFERENTIAL/PLATELET
Basophils Absolute: 0 10*3/uL (ref 0.0–0.2)
Basos: 0 %
EOS (ABSOLUTE): 0.2 10*3/uL (ref 0.0–0.4)
Eos: 2 %
Hematocrit: 35.3 % (ref 34.0–46.6)
Hemoglobin: 11.9 g/dL (ref 11.1–15.9)
Immature Grans (Abs): 0.1 10*3/uL (ref 0.0–0.1)
Immature Granulocytes: 1 %
Lymphocytes Absolute: 1.7 10*3/uL (ref 0.7–3.1)
Lymphs: 12 %
MCH: 28.2 pg (ref 26.6–33.0)
MCHC: 33.7 g/dL (ref 31.5–35.7)
MCV: 84 fL (ref 79–97)
Monocytes Absolute: 0.9 10*3/uL (ref 0.1–0.9)
Monocytes: 6 %
Neutrophils Absolute: 11.2 10*3/uL — ABNORMAL HIGH (ref 1.4–7.0)
Neutrophils: 79 %
Platelets: 363 10*3/uL (ref 150–450)
RBC: 4.22 x10E6/uL (ref 3.77–5.28)
RDW: 12.6 % (ref 11.7–15.4)
WBC: 14.1 10*3/uL — ABNORMAL HIGH (ref 3.4–10.8)

## 2021-10-31 LAB — CMP14+EGFR
ALT: 16 IU/L (ref 0–32)
AST: 26 IU/L (ref 0–40)
Albumin/Globulin Ratio: 1.7 (ref 1.2–2.2)
Albumin: 4.5 g/dL (ref 3.7–4.7)
Alkaline Phosphatase: 174 IU/L — ABNORMAL HIGH (ref 44–121)
BUN/Creatinine Ratio: 24 (ref 12–28)
BUN: 21 mg/dL (ref 8–27)
Bilirubin Total: 0.5 mg/dL (ref 0.0–1.2)
CO2: 23 mmol/L (ref 20–29)
Calcium: 9.1 mg/dL (ref 8.7–10.3)
Chloride: 103 mmol/L (ref 96–106)
Creatinine, Ser: 0.88 mg/dL (ref 0.57–1.00)
Globulin, Total: 2.6 g/dL (ref 1.5–4.5)
Glucose: 103 mg/dL — ABNORMAL HIGH (ref 70–99)
Potassium: 4.9 mmol/L (ref 3.5–5.2)
Sodium: 143 mmol/L (ref 134–144)
Total Protein: 7.1 g/dL (ref 6.0–8.5)
eGFR: 68 mL/min/{1.73_m2} (ref >59)

## 2021-11-09 ENCOUNTER — Telehealth: Payer: Self-pay | Admitting: Family Medicine

## 2021-11-09 DIAGNOSIS — Z01818 Encounter for other preprocedural examination: Secondary | ICD-10-CM

## 2021-11-09 NOTE — Telephone Encounter (Signed)
Please review lab result note and call patient.

## 2021-11-10 NOTE — Telephone Encounter (Signed)
I cannot send the clearance for surgery without her urine being cleared for an infection. Her surgeon's office specifically requests a urinalysis. If the UA is clear when she comes in, I can send her clearance. If it is not, I need to order a culture.

## 2021-11-10 NOTE — Telephone Encounter (Signed)
Pt returning call. Please call back.

## 2021-11-10 NOTE — Telephone Encounter (Signed)
Lmtcb.

## 2021-11-10 NOTE — Telephone Encounter (Signed)
Patient aware of lab results and verbalizes understanding.  States she does not have any urinary symptoms so she does not want to come leave another urine.  Will let us know if she starts getting symptoms. Also would like to know her EKG results - patient states if her EKG was normal that she does not want a call back. Only call if abnormal.

## 2021-11-10 NOTE — Patient Instructions (Signed)
DUE TO COVID-19 ONLY ONE VISITOR IS ALLOWED TO COME WITH YOU AND STAY IN THE WAITING ROOM ONLY DURING PRE OP AND PROCEDURE.   **NO VISITORS ARE ALLOWED IN THE SHORT STAY AREA OR RECOVERY ROOM!!**  IF YOU WILL BE ADMITTED INTO THE HOSPITAL YOU ARE ALLOWED ONLY TWO SUPPORT PEOPLE DURING VISITATION HOURS ONLY (7 AM -8PM)   The support person(s) must pass our screening, gel in and out, and wear a mask at all times, including in the patient's room. Patients must also wear a mask when staff or their support person are in the room. Visitors GUEST BADGE MUST BE WORN VISIBLY  One adult visitor may remain with you overnight and MUST be in the room by 8 P.M.  No visitors under the age of 33. Any visitor under the age of 81 must be accompanied by an adult.    COVID SWAB TESTING MUST BE COMPLETED ON: 11/16/21  **MUST PRESENT COMPLETED FORM AT TESTING SITE**    [address] (backside of the building) You are not required to quarantine, however you are required to wear a well-fitted mask when you are out and around people not in your household.  Hand Hygiene often Do NOT share personal items Notify your provider if you are in close contact with someone who has COVID or you develop fever 100.4 or greater, new onset of sneezing, cough, sore throat, shortness of breath or body aches.  [address], [address], must go inside of the hospital, NOT A DRIVE THRU!  (Must self quarantine after testing. Follow instructions on handout.)       Your procedure is scheduled on: 11/18/21   Report to [facility] Main Entrance    Report to admitting at: 1:05 PM   Call this number if you have problems the morning of surgery [phone]   Do not eat food :After Midnight.   May have liquids until : 12:45 PM   day of surgery  CLEAR LIQUID DIET  Foods Allowed                                                                      Foods Excluded  Water, Black Coffee and tea, regular and decaf                             liquids that you cannot  Plain Jell-O in any flavor  (No red)                                           see through such as: Fruit ices (not with fruit pulp)                                     milk, soups, orange juice              Iced Popsicles (No red)  All solid food                                   Apple juices Sports drinks like Gatorade (No red) Lightly seasoned clear broth or consume(fat free) Sugar  Sample Menu Breakfast                                Lunch                                     Supper Cranberry juice                    Beef broth                            Chicken broth Jell-O                                     Grape juice                           Apple juice Coffee or tea                        Jell-O                                      Popsicle                                                Coffee or tea                        Coffee or tea      Complete one Ensure drink the morning of surgery at : 12:45 PM      the day of surgery.    The day of surgery:  Drink ONE (1) Pre-Surgery Clear Ensure or G2 by am the morning of surgery. Drink in one sitting. Do not sip.  This drink was given to you during your hospital  pre-op appointment visit. Nothing else to drink after completing the  Pre-Surgery Clear Ensure or G2.          If you have questions, please contact your surgeon's office.     Oral Hygiene is also important to reduce your risk of infection.                                    Remember - BRUSH YOUR TEETH THE MORNING OF SURGERY WITH YOUR REGULAR TOOTHPASTE   Do NOT smoke after Midnight   Take these medicines the morning of surgery with A SIP OF WATER:   DO NOT TAKE ANY ORAL DIABETIC MEDICATIONS DAY OF YOUR SURGERY  You may not have any metal on your body including hair pins, jewelry, and  body piercing             Do not wear make-up, lotions, powders, perfumes/cologne, or deodorant  Do not wear nail polish including gel and S&S, artificial/acrylic nails, or any other type of covering on natural nails including finger and toenails. If you have artificial nails, gel coating, etc. that needs to be removed by a nail salon please have this removed prior to surgery or surgery may need to be canceled/ delayed if the surgeon/ anesthesia feels like they are unable to be safely monitored.   Do not shave  48 hours prior to surgery.    Do not bring valuables to the hospital. [place].   Contacts, dentures or bridgework may not be worn into surgery.   Bring small overnight bag day of surgery.    Patients discharged on the day of surgery will not be allowed to drive home.   Special Instructions: Bring a copy of your healthcare power of attorney and living will documents         the day of surgery if you haven't scanned them before.              Please read over the following fact sheets you were given: IF YOU HAVE QUESTIONS ABOUT YOUR PRE-OP INSTRUCTIONS PLEASE CALL [phone]     [facility] - Preparing for Surgery Before surgery, you can play an important role.  Because skin is not sterile, your skin needs to be as free of germs as possible.  You can reduce the number of germs on your skin by washing with CHG (chlorahexidine gluconate) soap before surgery.  CHG is an antiseptic cleaner which kills germs and bonds with the skin to continue killing germs even after washing. Please DO NOT use if you have an allergy to CHG or antibacterial soaps.  If your skin becomes reddened/irritated stop using the CHG and inform your nurse when you arrive at Short Stay. Do not shave (including legs and underarms) for at least 48 hours prior to the first CHG shower.  You may shave your face/neck. Please follow these instructions carefully:  1.  Shower  with CHG Soap the night before surgery and the  morning of Surgery.  2.  If you choose to wash your hair, wash your hair first as usual with your  normal  shampoo.  3.  After you shampoo, rinse your hair and body thoroughly to remove the  shampoo.                           4.  Use CHG as you would any other liquid soap.  You can apply chg directly  to the skin and wash                       Gently with a scrungie or clean washcloth.  5.  Apply the CHG Soap to your body ONLY FROM THE NECK DOWN.   Do not use on face/ open                           Wound or open sores. Avoid contact with eyes, ears mouth and genitals (private parts).  Wash face,  Genitals (private parts) with your normal soap.             6.  Wash thoroughly, paying special attention to the area where your surgery  will be performed.  7.  Thoroughly rinse your body with warm water from the neck down.  8.  DO NOT shower/wash with your normal soap after using and rinsing off  the CHG Soap.                9.  Pat yourself dry with a clean towel.            10.  Wear clean pajamas.            11.  Place clean sheets on your bed the night of your first shower and do not  sleep with pets. Day of Surgery : Do not apply any lotions/deodorants the morning of surgery.  Please wear clean clothes to the hospital/surgery center.  FAILURE TO FOLLOW THESE INSTRUCTIONS MAY RESULT IN THE CANCELLATION OF YOUR SURGERY PATIENT SIGNATURE_________________________________  NURSE SIGNATURE__________________________________  ________________________________________________________________________   [name]  An incentive spirometer is a tool that can help keep your lungs clear and active. This tool measures how well you are filling your lungs with each breath. Taking long deep breaths may help reverse or decrease the chance of developing breathing (pulmonary) problems (especially infection) following: A long period of  time when you are unable to move or be active. BEFORE THE PROCEDURE  If the spirometer includes an indicator to show your best effort, your nurse or respiratory therapist will set it to a desired goal. If possible, sit up straight or lean slightly forward. Try not to slouch. Hold the incentive spirometer in an upright position. INSTRUCTIONS FOR USE  Sit on the edge of your bed if possible, or sit up as far as you can in bed or on a chair. Hold the incentive spirometer in an upright position. Breathe out normally. Place the mouthpiece in your mouth and seal your lips tightly around it. Breathe in slowly and as deeply as possible, raising the piston or the ball toward the top of the column. Hold your breath for 3-5 seconds or for as long as possible. Allow the piston or ball to fall to the bottom of the column. Remove the mouthpiece from your mouth and breathe out normally. Rest for a few seconds and repeat Steps 1 through 7 at least 10 times every 1-2 hours when you are awake. Take your time and take a few normal breaths between deep breaths. The spirometer may include an indicator to show your best effort. Use the indicator as a goal to work toward during each repetition. After each set of 10 deep breaths, practice coughing to be sure your lungs are clear. If you have an incision (the cut made at the time of surgery), support your incision when coughing by placing a pillow or rolled up towels firmly against it. Once you are able to get out of bed, walk around indoors and cough well. You may stop using the incentive spirometer when instructed by your caregiver.  RISKS AND COMPLICATIONS Take your time so you do not get dizzy or light-headed. If you are in pain, you may need to take or ask for pain medication before doing incentive spirometry. It is harder to take a deep breath if you are having pain. AFTER USE Rest and breathe slowly and easily. It can be helpful to keep track of  a log of your  progress. Your caregiver can provide you with a simple table to help with this. If you are using the spirometer at home, follow these instructions: [place] IF:  You are having difficultly using the spirometer. You have trouble using the spirometer as often as instructed. Your pain medication is not giving enough relief while using the spirometer. You develop fever of 100.5 F (38.1 C) or higher. SEEK IMMEDIATE MEDICAL CARE IF:  You cough up bloody sputum that had not been present before. You develop fever of 102 F (38.9 C) or greater. You develop worsening pain at or near the incision site. MAKE SURE YOU:  Understand these instructions. Will watch your condition. Will get help right away if you are not doing well or get worse. Document Released: 04/11/2007 Document Revised: 02/21/2012 Document Reviewed: 06/12/2007 [facility] Patient Information 2014 [place], [place].   ________________________________________________________________________

## 2021-11-11 ENCOUNTER — Other Ambulatory Visit: Payer: Medicare Other

## 2021-11-11 ENCOUNTER — Encounter (HOSPITAL_COMMUNITY): Payer: Self-pay

## 2021-11-11 ENCOUNTER — Other Ambulatory Visit: Payer: Self-pay | Admitting: Family Medicine

## 2021-11-11 ENCOUNTER — Other Ambulatory Visit: Payer: Self-pay

## 2021-11-11 ENCOUNTER — Encounter (HOSPITAL_COMMUNITY)
Admission: RE | Admit: 2021-11-11 | Discharge: 2021-11-11 | Disposition: A | Discharge status: Home / Self Care | Payer: Medicare Other | Source: Ambulatory Visit | Attending: Orthopedic Surgery | Admitting: Orthopedic Surgery

## 2021-11-11 VITALS — BP 150/66 | HR 72 | Temp 98.3°F | Ht 65.0 in | Wt 202.5 lb

## 2021-11-11 DIAGNOSIS — Z01818 Encounter for other preprocedural examination: Secondary | ICD-10-CM | POA: Diagnosis not present

## 2021-11-11 DIAGNOSIS — Z01812 Encounter for preprocedural laboratory examination: Secondary | ICD-10-CM | POA: Diagnosis not present

## 2021-11-11 DIAGNOSIS — M1611 Unilateral primary osteoarthritis, right hip: Secondary | ICD-10-CM | POA: Insufficient documentation

## 2021-11-11 LAB — SURGICAL PCR SCREEN
MRSA, PCR: NEGATIVE
Staphylococcus aureus: NEGATIVE

## 2021-11-11 LAB — PROTIME-INR
INR: 1 (ref 0.8–1.2)
Prothrombin Time: 13.2 seconds (ref 11.4–15.2)

## 2021-11-11 LAB — URINALYSIS, ROUTINE W REFLEX MICROSCOPIC
Bilirubin, UA: NEGATIVE
Glucose, UA: NEGATIVE
Ketones, UA: NEGATIVE
Leukocytes,UA: NEGATIVE
Nitrite, UA: NEGATIVE
Protein,UA: NEGATIVE
RBC, UA: NEGATIVE
Specific Gravity, UA: 1.015 (ref 1.005–1.030)
Urobilinogen, Ur: 0.2 mg/dL (ref 0.2–1.0)
pH, UA: 7 (ref 5.0–7.5)

## 2021-11-11 NOTE — Progress Notes (Signed)
COVID Vaccine Completed: Yes Date COVID Vaccine completed: 01/07/21 x 3 COVID vaccine manufacturer:   Moderna    COVID Test: 11/16/21 PCP - Hendricks Limes: FNP. LOV: 10/20/21 Cardiologist -   Chest x-ray - 01/26/21 EKG - 10/30/21 Stress Test -  ECHO - 10/25/16 Cardiac Cath -  Pacemaker/ICD device last checked:  Sleep Study -  CPAP -   Fasting Blood Sugar -  Checks Blood Sugar _____ times a day  Blood Thinner Instructions: Aspirin Instructions: Last Dose:  Anesthesia review: Pericardial effusion  Patient denies shortness of breath, fever, cough and chest pain at PAT appointment   Patient verbalized understanding of instructions that were given to them at the PAT appointment. Patient was also instructed that they will need to review over the PAT instructions again at home before surgery.

## 2021-11-11 NOTE — Telephone Encounter (Signed)
Patient aware and verbalizes understanding- she is coming in today

## 2021-11-14 LAB — URINE CULTURE

## 2021-11-16 ENCOUNTER — Other Ambulatory Visit: Payer: Self-pay | Admitting: Orthopedic Surgery

## 2021-11-16 ENCOUNTER — Other Ambulatory Visit (HOSPITAL_COMMUNITY): Payer: Self-pay

## 2021-11-16 DIAGNOSIS — C9210 Chronic myeloid leukemia, BCR/ABL-positive, not having achieved remission: Secondary | ICD-10-CM

## 2021-11-16 LAB — SARS CORONAVIRUS 2 (TAT 6-24 HRS): SARS Coronavirus 2: NEGATIVE

## 2021-11-16 MED ORDER — NILOTINIB HCL 150 MG PO CAPS: 300.0000 mg | ORAL_CAPSULE | Freq: Two times a day (BID) | ORAL | 6 refills | Status: DC

## 2021-11-17 NOTE — H&P (Signed)
TOTAL HIP ADMISSION H&P  Patient is admitted for right total hip arthroplasty.  Subjective:  Chief Complaint: Right hip pain  HPI: Andrea Martinez, 75 y.o. female, has a history of pain and functional disability in the right hip due to arthritis and patient has failed non-surgical conservative treatments for greater than 12 weeks to include NSAID's and/or analgesics, flexibility and strengthening excercises, and activity modification. Onset of symptoms was gradual, starting  several  years ago with gradually worsening course since that time. The patient noted no past surgery on the right hip. Patient currently rates pain in the right hip at 7 out of 10 with activity. Patient has worsening of pain with activity and weight bearing, pain that interfers with activities of daily living, pain with passive range of motion, and crepitus. Patient has evidence of periarticular osteophytes and joint space narrowing by imaging studies. This condition presents safety issues increasing the risk of falls.There is no current active infection.  Patient Active Problem List   Diagnosis Date Noted   S/P lumbar fusion 01/28/2021   Mixed hyperlipidemia    Atherosclerosis of aorta ([place]) 09/14/2020   Anxiety 09/14/2020   Chronic low back pain 09/14/2020   Thoracic compression fracture ([place]) 02/14/2019   Sweating abnormality 10/11/2017   Iron deficiency anemia 02/16/2017   Solitary pulmonary nodule 10/23/2016   Bladder prolapse, female, acquired 11/21/2015   IBS (irritable bowel syndrome) 22/29/7989   Neutrophilic leukocytosis 21/19/4174   OA (osteoarthritis) of knee 11/21/2015   CML (chronic myeloid leukemia) ([place]) 11/20/2015   Dyspepsia 11/30/2012   Hypothyroidism 02/11/2010    Past Medical History:  Diagnosis Date   Anxiety    denies   Arthritis    Chronic myelogenous leukemia (CML), 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 Elyse Hsu   Lipoma    left upper arm   Mixed hyperlipidemia    Osteoporosis    Pericardial effusion    a. HCAP complicated by pericardial effusion requiring pericardial window 10/2016 and large L pleural effusion requring VATS.   Pleural effusion    a. s/p VATS 2017.   Pneumonia 10/2016   PONV (postoperative nausea and vomiting)    history of   Prolonged QT interval    Urine incontinence    UTI (lower urinary tract infection)    frequent   Vitamin D deficiency     Past Surgical History:  Procedure Laterality Date   BRAVO [place] STUDY  12/04/2012   Procedure: BRAVO [place];  Surgeon: [name], MD;  [facility];  Service: Endoscopy;  Laterality: N/A;   CHOLECYSTECTOMY N/A 01/03/2014   Procedure: LAPAROSCOPIC CHOLECYSTECTOMY WITH INTRAOPERATIVE CHOLANGIOGRAM;  Surgeon: Joyice Faster. Cornett, MD;  Location: [facility];  Service: General;  Laterality: N/A;   COLONOSCOPY  01/2004   DR [facility], few small tics   ESOPHAGOGASTRODUODENOSCOPY  02/2010   gastric ulcers   ESOPHAGOGASTRODUODENOSCOPY  12/04/2012   YCX:KGYJEHU gastritis (inflammation) was found in the gastric antrum; multiple biopsies The duodenal mucosa showed no abnormalities in the bulb and second portion of the duodenum   JOINT REPLACEMENT Right 10/22/2019   KYPHOPLASTY N/A 02/14/2019   Procedure: KYPHOPLASTY T12 and L1;  Surgeon: Melina Schools, MD;  Location: [facility];  Service: Orthopedics;  Laterality: N/A;  120 mins   LIPOMA EXCISION  08/02/2011   left shoulder  NOSE SURGERY     PARTIAL HYSTERECTOMY     vaginal at age 52 years of age   TOE 27 Left 1962   lt great toe   TOTAL KNEE ARTHROPLASTY Right 10/22/2019   Procedure: TOTAL KNEE ARTHROPLASTY;  Surgeon: Gaynelle Arabian, MD;  Location: [facility];  Service: Orthopedics;  Laterality: Right;  45min   TOTAL KNEE ARTHROPLASTY Left 03/03/2020   Procedure: TOTAL KNEE ARTHROPLASTY;  Surgeon: Gaynelle Arabian, MD;   Location: [facility];  Service: Orthopedics;  Laterality: Left;  13min   TRANSFORAMINAL LUMBAR INTERBODY FUSION (TLIF) WITH PEDICLE SCREW FIXATION 1 LEVEL N/A 01/28/2021   Procedure: TRANSFORAMINAL LUMBAR INTERBODY FUSION (TLIF) LUMBAR FIVE-SACRAL ONE;  Surgeon: Melina Schools, MD;  Location: [facility];  Service: Orthopedics;  Laterality: N/A;  4 hrs   VIDEO ASSISTED THORACOSCOPY (VATS)/EMPYEMA Left 10/25/2016   Procedure: VIDEO ASSISTED THORACOSCOPY (VATS), BRONCH,DRAINAGE OF PLEURAL EFFUSION,PERICARDIAL WINDOW WITH DRAINAGE OF PERICARDIAL FLUID, TEE;  Surgeon: [name], MD;  Location: [facility];  Service: Thoracic;  Laterality: Left;   VIDEO BRONCHOSCOPY N/A 10/25/2016   Procedure: VIDEO BRONCHOSCOPY;  Surgeon: [name], MD;  Location: [facility];  Service: Thoracic;  Laterality: N/A;    Prior to Admission medications   Medication Sig Start Date End Date Taking? Authorizing Provider  acetaminophen (TYLENOL) 500 MG tablet Take 1,000 mg by mouth every 8 (eight) hours as needed for moderate pain.   Yes [provider]  atorvastatin (LIPITOR) 10 MG tablet Take 1 tablet (10 mg total) by mouth daily. 09/24/21  Yes Hendricks Limes F, FNP  cetirizine (ZYRTEC) 10 MG tablet Take 10 mg by mouth daily.   Yes [provider]  Cholecalciferol (VITAMIN D3) 50 MCG (2000 UT) TABS Take 2,000 Units by mouth daily in the afternoon.   Yes [provider]  Cyanocobalamin (VITAMIN B-12 PO) Take 1 tablet by mouth daily in the afternoon.   Yes [provider]  escitalopram (LEXAPRO) 20 MG tablet TAKE 1 AND 1/2 TABLETS DAILY BY MOUTH 09/17/21  Yes Hendricks Limes F, FNP  lactose free nutrition (BOOST) LIQD Take 237 mLs by mouth daily.   Yes [provider]  levothyroxine (SYNTHROID) 112 MCG tablet Take 1 tablet (112 mcg total) by mouth daily before breakfast. 10/05/21  Yes Nida, Marella Chimes, MD  Multiple Vitamins-Minerals (CENTRUM SILVER PO) Take 1 tablet by mouth daily  in the afternoon.   Yes [provider]  Probiotic Product (PROBIOTIC DAILY PO) Take 2 tablets by mouth daily in the afternoon.   Yes [provider]  nilotinib (TASIGNA) 150 MG capsule Take 2 capsules (300 mg total) by mouth every 12 (twelve) hours. 11/16/21   Derek Jack, MD    Allergies  Allergen Reactions   Nucynta [Tapentadol Hcl] Swelling   Codeine Nausea Only and Other (See Comments)    Headache   Sulfonamide Derivatives Nausea Only and Other (See Comments)    Headache   Esomeprazole Magnesium Nausea Only   Fluconazole Rash    Redness and blistering on left thigh    Social History   Socioeconomic History   Marital status: Married    Spouse name: Not on file   Number of children: 2   Years of education: 12th grade   Highest education level: Not on file  Occupational History   Occupation: retired    Fish farm manager: UENPLOYED    Employer: RETIRED  Tobacco Use   Smoking status: Never   Smokeless tobacco: Never  Vaping Use   Vaping  Use: Never used  Substance and Sexual Activity   Alcohol use: No    Alcohol/week: 0.0 standard drinks   Drug use: No   Sexual activity: Yes    Partners: Male    Birth control/protection: None  Other Topics Concern   Not on file  Social History Narrative   Not on file   Social Determinants of Health   Financial Resource Strain: Low Risk    Difficulty of Paying Living Expenses: Not hard at all  Food Insecurity: No Food Insecurity   Worried About Charity fundraiser in the Last Year: Never true   [place] in the Last Year: Never true  Transportation Needs: No Transportation Needs   Lack of Transportation (Medical): No   Lack of Transportation (Non-Medical): No  Physical Activity: Sufficiently Active   Days of Exercise per Week: 7 days   Minutes of Exercise per Session: 30 min  Stress: No Stress Concern Present   Feeling of Stress : Only a little  Social Connections: Engineer, building services of  Communication with Friends and Family: More than three times a week   Frequency of Social Gatherings with Friends and Family: More than three times a week   Attends Religious Services: More than 4 times per year   Active Member of [employer] or Organizations: Yes   Attends Archivist Meetings: 1 to 4 times per year   Marital Status: Married  Human resources officer Violence: Not on file    Tobacco Use: Low Risk    Smoking Tobacco Use: Never   Smokeless Tobacco Use: Never   Passive Exposure: Not on file   Social History   Substance and Sexual Activity  Alcohol Use No   Alcohol/week: 0.0 standard drinks    Family History  Problem Relation Age of Onset   Lung cancer Father    COPD Mother    Stroke Mother    Thyroid disease Mother    Colon cancer Neg Hx    Anesthesia problems Neg Hx    Hypotension Neg Hx    Malignant hyperthermia Neg Hx    Pseudochol deficiency Neg Hx     ROS: Constitutional: no fever, no chills, no night sweats, no significant weight loss Cardiovascular: no chest pain, no palpitations Respiratory: no cough, no shortness of breath, No COPD Gastrointestinal: no vomiting, no nausea Musculoskeletal: no swelling in Joints, Joint Pain Neurologic: no numbness, no tingling, no difficulty with balance    Objective:  Physical Exam: Well nourished and well developed.  General: Alert and oriented x3, cooperative and pleasant, no acute distress.  Head: normocephalic, atraumatic, neck supple.  Eyes: EOMI.  Respiratory: breath sounds clear in all fields, no wheezing, rales, or rhonchi. Cardiovascular: Regular rate and rhythm, no murmurs, gallops or rubs.  Abdomen: non-tender to palpation and soft, normoactive bowel sounds. Musculoskeletal:   The patient is sitting in a chair with obvious pain.       Right Hip Exam:   The range of motion: Flexion to 100 degrees, Internal Rotation is minimal, External Rotation to 20 degrees, and abduction to 20 degrees without  discomfort.       Left Hip Exam:   The range of motion: normal without discomfort.   There is no tenderness over the greater trochanteric bursa.   Calves soft and nontender. Motor function intact in LE. Strength 5/5 LE bilaterally. Neuro: Distal pulses 2+. Sensation to light touch intact in LE.   Vital signs in last 24 hours:  Imaging Review Radiographs- AP pelvis, AP and lateral of the right hip dated 07/2021 demonstrate bone-on-bone arthritis in the right hip. She also has some arthritic changes on the left, but not as bad.  Assessment/Plan:  End stage arthritis, right hip  The patient history, physical examination, clinical judgement of the provider and imaging studies are consistent with end stage degenerative joint disease of the right hip and total hip arthroplasty is deemed medically necessary. The treatment options including medical management, injection therapy, arthroscopy and arthroplasty were discussed at length. The risks and benefits of total hip arthroplasty were presented and reviewed. The risks due to aseptic loosening, infection, stiffness, dislocation/subluxation, thromboembolic complications and other imponderables were discussed. The patient acknowledged the explanation, agreed to proceed with the plan and consent was signed. Patient is being admitted for inpatient treatment for surgery, pain control, PT, OT, prophylactic antibiotics, VTE prophylaxis, progressive ambulation and ADLs and discharge planning.The patient is planning to be discharged  home .   Patient's anticipated LOS is less than 2 midnights, meeting these requirements: - Lives within 1 hour of care - Has a competent adult at home to recover with post-op - NO history of  - Chronic pain requiring opioids  - Diabetes  - Coronary Artery Disease  - Heart failure  - Heart attack  - Stroke  - DVT/VTE  - Cardiac arrhythmia  - Respiratory Failure/COPD  - Renal failure  - Anemia  - Advanced Liver  disease    Therapy Plans: HEP Disposition: Home with Husband Planned DVT Prophylaxis: Xarelto 10mg  (currently dx with Leukemia) DME Needed: None PCP: Delight Ovens, FNP (clearance received) TXA: IV Allergies: Codeine, Diflucan, Esomeprazole, Fluconazole, Sulfa Anesthesia Concerns: Nausea in the past, but not recently BMI: 34.8 Last HgbA1c: n/a  [facility] [place] on [address]  - Patient was instructed on what medications to stop prior to surgery. - Follow-up visit in 2 weeks with Dr. Wynelle Link - Begin physical therapy following surgery - Pre-operative lab work as pre-surgical testing - Prescriptions will be provided in hospital at time of discharge  Fenton Foy, [facility] [place], PA-C Orthopedic Surgery [facility] [place]

## 2021-11-18 ENCOUNTER — Encounter (HOSPITAL_COMMUNITY)
Admission: RE | Disposition: A | Discharge status: Home / Self Care | Payer: Self-pay | Source: Home / Self Care | Attending: Orthopedic Surgery

## 2021-11-18 ENCOUNTER — Encounter (HOSPITAL_COMMUNITY): Payer: Self-pay | Admitting: Orthopedic Surgery

## 2021-11-18 ENCOUNTER — Observation Stay (HOSPITAL_COMMUNITY): Payer: Medicare Other

## 2021-11-18 ENCOUNTER — Observation Stay (HOSPITAL_COMMUNITY)
Admission: RE | Admit: 2021-11-18 | Discharge: 2021-11-19 | Disposition: A | Discharge status: Home / Self Care | Payer: Medicare Other | Attending: Orthopedic Surgery | Admitting: Orthopedic Surgery

## 2021-11-18 ENCOUNTER — Ambulatory Visit (HOSPITAL_COMMUNITY): Payer: Medicare Other | Admitting: Registered Nurse

## 2021-11-18 ENCOUNTER — Other Ambulatory Visit: Payer: Self-pay

## 2021-11-18 ENCOUNTER — Ambulatory Visit (HOSPITAL_COMMUNITY): Payer: Medicare Other

## 2021-11-18 ENCOUNTER — Ambulatory Visit (HOSPITAL_COMMUNITY): Payer: Medicare Other | Admitting: Physician Assistant

## 2021-11-18 DIAGNOSIS — Z96653 Presence of artificial knee joint, bilateral: Secondary | ICD-10-CM | POA: Insufficient documentation

## 2021-11-18 DIAGNOSIS — M1611 Unilateral primary osteoarthritis, right hip: Principal | ICD-10-CM | POA: Insufficient documentation

## 2021-11-18 DIAGNOSIS — Z96649 Presence of unspecified artificial hip joint: Secondary | ICD-10-CM

## 2021-11-18 DIAGNOSIS — E039 Hypothyroidism, unspecified: Secondary | ICD-10-CM | POA: Insufficient documentation

## 2021-11-18 DIAGNOSIS — Z96641 Presence of right artificial hip joint: Secondary | ICD-10-CM

## 2021-11-18 DIAGNOSIS — M169 Osteoarthritis of hip, unspecified: Secondary | ICD-10-CM | POA: Diagnosis present

## 2021-11-18 DIAGNOSIS — Z79899 Other long term (current) drug therapy: Secondary | ICD-10-CM | POA: Insufficient documentation

## 2021-11-18 HISTORY — PX: TOTAL HIP ARTHROPLASTY: SHX124

## 2021-11-18 LAB — CBC
HCT: 41.3 % (ref 36.0–46.0)
Hemoglobin: 12.5 g/dL (ref 12.0–15.0)
MCH: 28.3 pg (ref 26.0–34.0)
MCHC: 30.3 g/dL (ref 30.0–36.0)
MCV: 93.4 fL (ref 80.0–100.0)
Platelets: 221 10*3/uL (ref 150–400)
RBC: 4.42 MIL/uL (ref 3.87–5.11)
RDW: 14 % (ref 11.5–15.5)
WBC: 11 10*3/uL — ABNORMAL HIGH (ref 4.0–10.5)
nRBC: 0 % (ref 0.0–0.2)

## 2021-11-18 LAB — COMPREHENSIVE METABOLIC PANEL
ALT: 23 U/L (ref 0–44)
AST: 28 U/L (ref 15–41)
Albumin: 4 g/dL (ref 3.5–5.0)
Alkaline Phosphatase: 143 U/L — ABNORMAL HIGH (ref 38–126)
Anion gap: 7 (ref 5–15)
BUN: 30 mg/dL — ABNORMAL HIGH (ref 8–23)
CO2: 25 mmol/L (ref 22–32)
Calcium: 8.9 mg/dL (ref 8.9–10.3)
Chloride: 106 mmol/L (ref 98–111)
Creatinine, Ser: 1.03 mg/dL — ABNORMAL HIGH (ref 0.44–1.00)
GFR, Estimated: 56 mL/min — ABNORMAL LOW (ref >60)
Glucose, Bld: 110 mg/dL — ABNORMAL HIGH (ref 70–99)
Potassium: 4.4 mmol/L (ref 3.5–5.1)
Sodium: 138 mmol/L (ref 135–145)
Total Bilirubin: 1.1 mg/dL (ref 0.3–1.2)
Total Protein: 7.5 g/dL (ref 6.5–8.1)

## 2021-11-18 LAB — TYPE AND SCREEN
ABO/RH(D): A POS
Antibody Screen: NEGATIVE

## 2021-11-18 SURGERY — ARTHROPLASTY, HIP, TOTAL, ANTERIOR APPROACH
Anesthesia: Spinal | Site: Hip | Laterality: Right

## 2021-11-18 MED ORDER — TRANEXAMIC ACID-NACL 1000-0.7 MG/100ML-% IV SOLN
1000.0000 mg | INTRAVENOUS | Status: AC
  Administered 2021-11-18: 1000 mg via INTRAVENOUS
  Filled 2021-11-18: qty 100

## 2021-11-18 MED ORDER — ONDANSETRON HCL 4 MG/2ML IJ SOLN
INTRAMUSCULAR | Status: DC | PRN
  Administered 2021-11-18: 4 mg via INTRAVENOUS

## 2021-11-18 MED ORDER — ONDANSETRON HCL 4 MG PO TABS: 4.0000 mg | ORAL_TABLET | Freq: Four times a day (QID) | ORAL | Status: DC | PRN

## 2021-11-18 MED ORDER — FENTANYL CITRATE (PF) 100 MCG/2ML IJ SOLN
INTRAMUSCULAR | Status: DC | PRN
  Administered 2021-11-18 (×2): 50 ug via INTRAVENOUS

## 2021-11-18 MED ORDER — PROPOFOL 500 MG/50ML IV EMUL
INTRAVENOUS | Status: DC | PRN
  Administered 2021-11-18: 75 ug/kg/min via INTRAVENOUS

## 2021-11-18 MED ORDER — MIDAZOLAM HCL 2 MG/2ML IJ SOLN
INTRAMUSCULAR | Status: AC
  Filled 2021-11-18: qty 2

## 2021-11-18 MED ORDER — DEXAMETHASONE SODIUM PHOSPHATE 10 MG/ML IJ SOLN
INTRAMUSCULAR | Status: AC
  Filled 2021-11-18: qty 1

## 2021-11-18 MED ORDER — WATER FOR IRRIGATION, STERILE IR SOLN
Status: DC | PRN
  Administered 2021-11-18: 2000 mL

## 2021-11-18 MED ORDER — ACETAMINOPHEN 10 MG/ML IV SOLN
1000.0000 mg | Freq: Four times a day (QID) | INTRAVENOUS | Status: DC
  Administered 2021-11-18: 1000 mg via INTRAVENOUS
  Filled 2021-11-18: qty 100

## 2021-11-18 MED ORDER — CEFAZOLIN SODIUM-DEXTROSE 2-4 GM/100ML-% IV SOLN
2.0000 g | Freq: Four times a day (QID) | INTRAVENOUS | Status: AC
  Administered 2021-11-18 – 2021-11-19 (×2): 2 g via INTRAVENOUS
  Filled 2021-11-18 (×2): qty 100

## 2021-11-18 MED ORDER — ONDANSETRON HCL 4 MG/2ML IJ SOLN: 4.0000 mg | Freq: Four times a day (QID) | INTRAMUSCULAR | Status: DC | PRN

## 2021-11-18 MED ORDER — HYDROCODONE-ACETAMINOPHEN 5-325 MG PO TABS
1.0000 | ORAL_TABLET | ORAL | Status: DC | PRN
  Administered 2021-11-18: 2 via ORAL
  Filled 2021-11-18: qty 2

## 2021-11-18 MED ORDER — PHENYLEPHRINE HCL-NACL 20-0.9 MG/250ML-% IV SOLN
INTRAVENOUS | Status: AC
  Filled 2021-11-18: qty 250

## 2021-11-18 MED ORDER — PROPOFOL 1000 MG/100ML IV EMUL
INTRAVENOUS | Status: AC
  Filled 2021-11-18: qty 100

## 2021-11-18 MED ORDER — CEFAZOLIN SODIUM-DEXTROSE 2-4 GM/100ML-% IV SOLN
2.0000 g | INTRAVENOUS | Status: AC
  Administered 2021-11-18: 2 g via INTRAVENOUS
  Filled 2021-11-18: qty 100

## 2021-11-18 MED ORDER — FENTANYL CITRATE PF 50 MCG/ML IJ SOSY
25.0000 ug | PREFILLED_SYRINGE | INTRAMUSCULAR | Status: DC | PRN
  Administered 2021-11-18: 25 ug via INTRAVENOUS
  Administered 2021-11-18: 50 ug via INTRAVENOUS
  Administered 2021-11-18: 25 ug via INTRAVENOUS

## 2021-11-18 MED ORDER — METOCLOPRAMIDE HCL 5 MG/ML IJ SOLN: 5.0000 mg | Freq: Three times a day (TID) | INTRAMUSCULAR | Status: DC | PRN

## 2021-11-18 MED ORDER — ONDANSETRON HCL 4 MG/2ML IJ SOLN
INTRAMUSCULAR | Status: AC
  Filled 2021-11-18: qty 2

## 2021-11-18 MED ORDER — MENTHOL 3 MG MT LOZG: 1.0000 | LOZENGE | OROMUCOSAL | Status: DC | PRN

## 2021-11-18 MED ORDER — DOCUSATE SODIUM 100 MG PO CAPS
100.0000 mg | ORAL_CAPSULE | Freq: Two times a day (BID) | ORAL | Status: DC
  Administered 2021-11-18 – 2021-11-19 (×2): 100 mg via ORAL
  Filled 2021-11-18 (×2): qty 1

## 2021-11-18 MED ORDER — ACETAMINOPHEN 325 MG PO TABS: 325.0000 mg | ORAL_TABLET | Freq: Four times a day (QID) | ORAL | Status: DC | PRN

## 2021-11-18 MED ORDER — 0.9 % SODIUM CHLORIDE (POUR BTL) OPTIME
TOPICAL | Status: DC | PRN
  Administered 2021-11-18: 1000 mL

## 2021-11-18 MED ORDER — ATORVASTATIN CALCIUM 10 MG PO TABS
10.0000 mg | ORAL_TABLET | Freq: Every day | ORAL | Status: DC
  Administered 2021-11-19: 10 mg via ORAL
  Filled 2021-11-18: qty 1

## 2021-11-18 MED ORDER — ORAL CARE MOUTH RINSE: 15.0000 mL | Freq: Once | OROMUCOSAL | Status: AC

## 2021-11-18 MED ORDER — PROPOFOL 10 MG/ML IV BOLUS
INTRAVENOUS | Status: DC | PRN
  Administered 2021-11-18: 20 mg via INTRAVENOUS

## 2021-11-18 MED ORDER — BISACODYL 10 MG RE SUPP: 10.0000 mg | Freq: Every day | RECTAL | Status: DC | PRN

## 2021-11-18 MED ORDER — BUPIVACAINE IN DEXTROSE 0.75-8.25 % IT SOLN
INTRATHECAL | Status: DC | PRN
  Administered 2021-11-18: 1.6 mL via INTRATHECAL

## 2021-11-18 MED ORDER — HYDROCODONE-ACETAMINOPHEN 5-325 MG PO TABS
ORAL_TABLET | ORAL | Status: AC
  Filled 2021-11-18: qty 2

## 2021-11-18 MED ORDER — METOCLOPRAMIDE HCL 5 MG PO TABS: 5.0000 mg | ORAL_TABLET | Freq: Three times a day (TID) | ORAL | Status: DC | PRN

## 2021-11-18 MED ORDER — SODIUM CHLORIDE 0.9 % IV SOLN: INTRAVENOUS | Status: DC

## 2021-11-18 MED ORDER — CHLORHEXIDINE GLUCONATE 0.12 % MT SOLN
15.0000 mL | Freq: Once | OROMUCOSAL | Status: AC
  Administered 2021-11-18: 15 mL via OROMUCOSAL

## 2021-11-18 MED ORDER — METHOCARBAMOL 500 MG IVPB - SIMPLE MED
500.0000 mg | Freq: Four times a day (QID) | INTRAVENOUS | Status: DC | PRN
  Administered 2021-11-18: 500 mg via INTRAVENOUS
  Filled 2021-11-18: qty 50

## 2021-11-18 MED ORDER — BUPIVACAINE HCL (PF) 0.25 % IJ SOLN
INTRAMUSCULAR | Status: AC
  Filled 2021-11-18: qty 30

## 2021-11-18 MED ORDER — RIVAROXABAN 10 MG PO TABS: 10.0000 mg | ORAL_TABLET | Freq: Every day | ORAL | Status: DC

## 2021-11-18 MED ORDER — MIDAZOLAM HCL 5 MG/5ML IJ SOLN
INTRAMUSCULAR | Status: DC | PRN
  Administered 2021-11-18: 2 mg via INTRAVENOUS

## 2021-11-18 MED ORDER — ESCITALOPRAM OXALATE 20 MG PO TABS
30.0000 mg | ORAL_TABLET | Freq: Every day | ORAL | Status: DC
  Administered 2021-11-18 – 2021-11-19 (×2): 30 mg via ORAL
  Filled 2021-11-18 (×2): qty 1

## 2021-11-18 MED ORDER — POVIDONE-IODINE 10 % EX SWAB
2.0000 "application " | Freq: Once | CUTANEOUS | Status: AC
  Administered 2021-11-18: 2 via TOPICAL

## 2021-11-18 MED ORDER — LACTATED RINGERS IV SOLN: INTRAVENOUS | Status: DC

## 2021-11-18 MED ORDER — MORPHINE SULFATE (PF) 2 MG/ML IV SOLN
0.5000 mg | INTRAVENOUS | Status: DC | PRN
  Administered 2021-11-18 – 2021-11-19 (×2): 1 mg via INTRAVENOUS
  Filled 2021-11-18 (×2): qty 1

## 2021-11-18 MED ORDER — FENTANYL CITRATE PF 50 MCG/ML IJ SOSY
PREFILLED_SYRINGE | INTRAMUSCULAR | Status: AC
  Administered 2021-11-18: 50 ug via INTRAVENOUS
  Filled 2021-11-18: qty 3

## 2021-11-18 MED ORDER — BUPIVACAINE HCL 0.25 % IJ SOLN
INTRAMUSCULAR | Status: DC | PRN
  Administered 2021-11-18: 30 mL

## 2021-11-18 MED ORDER — PHENYLEPHRINE HCL-NACL 20-0.9 MG/250ML-% IV SOLN
INTRAVENOUS | Status: DC | PRN
  Administered 2021-11-18: 30 ug/min via INTRAVENOUS

## 2021-11-18 MED ORDER — NILOTINIB HCL 150 MG PO CAPS: 300.0000 mg | ORAL_CAPSULE | Freq: Two times a day (BID) | ORAL | Status: DC

## 2021-11-18 MED ORDER — PHENYLEPHRINE 40 MCG/ML (10ML) SYRINGE FOR IV PUSH (FOR BLOOD PRESSURE SUPPORT)
PREFILLED_SYRINGE | INTRAVENOUS | Status: AC
  Filled 2021-11-18: qty 10

## 2021-11-18 MED ORDER — METHOCARBAMOL 500 MG IVPB - SIMPLE MED
INTRAVENOUS | Status: AC
  Filled 2021-11-18: qty 50

## 2021-11-18 MED ORDER — FENTANYL CITRATE (PF) 100 MCG/2ML IJ SOLN
INTRAMUSCULAR | Status: AC
  Filled 2021-11-18: qty 2

## 2021-11-18 MED ORDER — POLYETHYLENE GLYCOL 3350 17 G PO PACK: 17.0000 g | PACK | Freq: Every day | ORAL | Status: DC | PRN

## 2021-11-18 MED ORDER — DEXAMETHASONE SODIUM PHOSPHATE 10 MG/ML IJ SOLN
10.0000 mg | Freq: Once | INTRAMUSCULAR | Status: AC
  Administered 2021-11-19: 10 mg via INTRAVENOUS
  Filled 2021-11-18: qty 1

## 2021-11-18 MED ORDER — PHENOL 1.4 % MT LIQD: 1.0000 | OROMUCOSAL | Status: DC | PRN

## 2021-11-18 MED ORDER — HYDROCODONE-ACETAMINOPHEN 7.5-325 MG PO TABS
1.0000 | ORAL_TABLET | ORAL | Status: DC | PRN
  Administered 2021-11-19: 1 via ORAL
  Administered 2021-11-19 (×3): 2 via ORAL
  Filled 2021-11-18 (×4): qty 2

## 2021-11-18 MED ORDER — DEXAMETHASONE SODIUM PHOSPHATE 10 MG/ML IJ SOLN
8.0000 mg | Freq: Once | INTRAMUSCULAR | Status: AC
  Administered 2021-11-18: 10 mg via INTRAVENOUS

## 2021-11-18 MED ORDER — METHOCARBAMOL 500 MG PO TABS
500.0000 mg | ORAL_TABLET | Freq: Four times a day (QID) | ORAL | Status: DC | PRN
  Administered 2021-11-19 (×2): 500 mg via ORAL
  Filled 2021-11-18 (×3): qty 1

## 2021-11-18 MED ORDER — LEVOTHYROXINE SODIUM 112 MCG PO TABS
112.0000 ug | ORAL_TABLET | Freq: Every day | ORAL | Status: DC
  Administered 2021-11-19: 112 ug via ORAL
  Filled 2021-11-18: qty 1

## 2021-11-18 MED ORDER — PHENYLEPHRINE 40 MCG/ML (10ML) SYRINGE FOR IV PUSH (FOR BLOOD PRESSURE SUPPORT)
PREFILLED_SYRINGE | INTRAVENOUS | Status: DC | PRN
  Administered 2021-11-18: 80 ug via INTRAVENOUS

## 2021-11-18 SURGICAL SUPPLY — 44 items
BAG COUNTER SPONGE SURGICOUNT (BAG) IMPLANT
BAG DECANTER FOR FLEXI CONT (MISCELLANEOUS) IMPLANT
BAG ZIPLOCK 12X15 (MISCELLANEOUS) IMPLANT
BLADE SAG 18X100X1.27 (BLADE) ×2 IMPLANT
COVER PERINEAL POST (MISCELLANEOUS) ×2 IMPLANT
COVER SURGICAL LIGHT HANDLE (MISCELLANEOUS) ×2 IMPLANT
CUP ACET PINNACLE SECTR 50MM (Hips) ×1 IMPLANT
DECANTER SPIKE VIAL GLASS SM (MISCELLANEOUS) ×2 IMPLANT
DRAPE FOOT SWITCH (DRAPES) ×2 IMPLANT
DRAPE STERI IOBAN 125X83 (DRAPES) ×2 IMPLANT
DRAPE U-SHAPE 47X51 STRL (DRAPES) ×4 IMPLANT
DRESSING AQUACEL AG SP 3.5X10 (GAUZE/BANDAGES/DRESSINGS) ×1 IMPLANT
DRSG AQUACEL AG ADV 3.5X10 (GAUZE/BANDAGES/DRESSINGS) ×2 IMPLANT
DRSG AQUACEL AG SP 3.5X10 (GAUZE/BANDAGES/DRESSINGS) ×2
DURAPREP 26ML APPLICATOR (WOUND CARE) ×2 IMPLANT
ELECT REM PT RETURN 15FT ADLT (MISCELLANEOUS) ×2 IMPLANT
GLOVE SRG 8 PF TXTR STRL LF DI (GLOVE) ×1 IMPLANT
GLOVE SURG ENC MOIS LTX SZ6.5 (GLOVE) ×2 IMPLANT
GLOVE SURG ENC MOIS LTX SZ7 (GLOVE) ×2 IMPLANT
GLOVE SURG ENC MOIS LTX SZ8 (GLOVE) ×4 IMPLANT
GLOVE SURG UNDER POLY LF SZ7 (GLOVE) ×2 IMPLANT
GLOVE SURG UNDER POLY LF SZ8 (GLOVE) ×2
GLOVE SURG UNDER POLY LF SZ8.5 (GLOVE) IMPLANT
GOWN STRL REUS W/TWL LRG LVL3 (GOWN DISPOSABLE) ×4 IMPLANT
GOWN STRL REUS W/TWL XL LVL3 (GOWN DISPOSABLE) IMPLANT
HEAD FEM STD 32X+1 STRL (Hips) ×2 IMPLANT
HOLDER FOLEY CATH W/STRAP (MISCELLANEOUS) ×2 IMPLANT
KIT TURNOVER KIT A (KITS) IMPLANT
LINER MARATHON 32 50 (Hips) ×2 IMPLANT
MANIFOLD NEPTUNE II (INSTRUMENTS) ×2 IMPLANT
PACK ANTERIOR HIP CUSTOM (KITS) ×2 IMPLANT
PENCIL SMOKE EVACUATOR COATED (MISCELLANEOUS) ×2 IMPLANT
PINNACLE SECTOR CUP 50MM (Hips) ×2 IMPLANT
STEM FEMORAL SZ 6MM STD ACTIS (Stem) ×2 IMPLANT
STRIP CLOSURE SKIN 1/2X4 (GAUZE/BANDAGES/DRESSINGS) ×2 IMPLANT
SUT ETHIBOND NAB CT1 #1 30IN (SUTURE) ×2 IMPLANT
SUT MNCRL AB 4-0 PS2 18 (SUTURE) ×2 IMPLANT
SUT STRATAFIX 0 PDS 27 VIOLET (SUTURE) ×2
SUT VIC AB 2-0 CT1 27 (SUTURE) ×4
SUT VIC AB 2-0 CT1 TAPERPNT 27 (SUTURE) ×2 IMPLANT
SUTURE STRATFX 0 PDS 27 VIOLET (SUTURE) ×1 IMPLANT
SYR 50ML LL SCALE MARK (SYRINGE) IMPLANT
TRAY FOLEY MTR SLVR 16FR STAT (SET/KITS/TRAYS/PACK) ×2 IMPLANT
TUBE SUCTION HIGH CAP CLEAR NV (SUCTIONS) ×2 IMPLANT

## 2021-11-18 NOTE — Anesthesia Preprocedure Evaluation (Addendum)
Anesthesia Evaluation  Patient identified by MRN, date of birth, ID band Patient awake    Reviewed: Allergy & Precautions, NPO status , Patient's Chart, lab work & pertinent test results  History of Anesthesia Complications (+) PONV  Airway Mallampati: II  TM Distance: >3 FB Neck ROM: Full    Dental no notable dental hx.    Pulmonary neg pulmonary ROS,    Pulmonary exam normal breath sounds clear to auscultation       Cardiovascular negative cardio ROS Normal cardiovascular exam Rhythm:Regular Rate:Normal  TTE 2016 Normal EF, no significant valvular abnormalities   Neuro/Psych PSYCHIATRIC DISORDERS Anxiety negative neurological ROS     GI/Hepatic Neg liver ROS, PUD,   Endo/Other  Hypothyroidism   Renal/GU negative Renal ROS  negative genitourinary   Musculoskeletal  (+) Arthritis ,   Abdominal   Peds  Hematology  (+) Blood dyscrasia (CML), ,   Anesthesia Other Findings   Reproductive/Obstetrics                             Anesthesia Physical Anesthesia Plan  ASA: 2  Anesthesia Plan: Spinal   Post-op Pain Management: Ofirmev IV (intra-op)   Induction:   PONV Risk Score and Plan: 3 and Treatment may vary due to age or medical condition, Midazolam, Propofol infusion, Dexamethasone and Ondansetron  Airway Management Planned: Natural Airway  Additional Equipment:   Intra-op Plan:   Post-operative Plan:   Informed Consent: I have reviewed the patients History and Physical, chart, labs and discussed the procedure including the risks, benefits and alternatives for the proposed anesthesia with the patient or authorized representative who has indicated his/her understanding and acceptance.     Dental advisory given  Plan Discussed with: CRNA  Anesthesia Plan Comments:         Anesthesia Quick Evaluation

## 2021-11-18 NOTE — Interval H&P Note (Signed)
History and Physical Interval Note:  11/18/2021 1:08 PM  [name]  has presented today for surgery, with the diagnosis of right hip osteoarthritis.  The various methods of treatment have been discussed with the patient and family. After consideration of risks, benefits and other options for treatment, the patient has consented to  Procedure(s): TOTAL HIP ARTHROPLASTY ANTERIOR APPROACH (Right) as a surgical intervention.  The patient's history has been reviewed, patient examined, no change in status, stable for surgery.  I have reviewed the patient's chart and labs.  Questions were answered to the patient's satisfaction.     Pilar Plate [name]

## 2021-11-18 NOTE — Discharge Instructions (Signed)
Frank Aluisio, MD Total Joint Specialist [facility] [address] [place], [place] [address] [phone]  ANTERIOR APPROACH TOTAL HIP REPLACEMENT POSTOPERATIVE DIRECTIONS     Hip Rehabilitation, Guidelines Following Surgery  The results of a hip operation are greatly improved after range of motion and muscle strengthening exercises. Follow all safety measures which are given to protect your hip. If any of these exercises cause increased pain or swelling in your joint, decrease the amount until you are comfortable again. Then slowly increase the exercises. Call your caregiver if you have problems or questions.   BLOOD CLOT PREVENTION Take a 10 mg Xarelto once a day for three weeks following surgery. Then take an 81 mg Aspirin once a day for three weeks. Then discontinue Aspirin. You may resume your vitamins/supplements once you have discontinued the Xarelto. Do not take any NSAIDs (Advil, Aleve, Ibuprofen, Meloxicam, etc.) until you have discontinued the Xarelto.   HOME CARE INSTRUCTIONS  Remove items at home which could result in a fall. This includes throw rugs or furniture in walking pathways.  ICE to the affected hip as frequently as 20-30 minutes an hour and then as needed for pain and swelling. Continue to use ice on the hip for pain and swelling from surgery. You may notice swelling that will progress down to the foot and ankle. This is normal after surgery. Elevate the leg when you are not up walking on it.   Continue to use the breathing machine which will help keep your temperature down.  It is common for your temperature to cycle up and down following surgery, especially at night when you are not up moving around and exerting yourself.  The breathing machine keeps your lungs expanded and your temperature down.  DIET You may resume your previous home diet once your are discharged from the hospital.  DRESSING / WOUND CARE / SHOWERING You have an  adhesive waterproof bandage over the incision. Leave this in place until your first follow-up appointment. Once you remove this you will not need to place another bandage.  You may begin showering 3 days following surgery, but do not submerge the incision under water.  ACTIVITY For the first 3-5 days, it is important to rest and keep the operative leg elevated. You should, as a general rule, rest for 50 minutes and walk/stretch for 10 minutes per hour. After 5 days, you may slowly increase activity as tolerated.  Perform the exercises you were provided twice a day for about 15-20 minutes each session. Begin these 2 days following surgery. Walk with your walker as instructed. Use the walker until you are comfortable transitioning to a cane. Walk with the cane in the opposite hand of the operative leg. You may discontinue the cane once you are comfortable and walking steadily. Avoid periods of inactivity such as sitting longer than an hour when not asleep. This helps prevent blood clots.  Do not drive a car for 6 weeks or until released by your surgeon.  Do not drive while taking narcotics.  TED HOSE STOCKINGS Wear the elastic stockings on both legs for three weeks following surgery during the day. You may remove them at night while sleeping.  WEIGHT BEARING Weight bearing as tolerated with assist device (walker, cane, etc) as directed, use it as long as suggested by your surgeon or therapist, typically at least 4-6 weeks.  POSTOPERATIVE CONSTIPATION PROTOCOL Constipation - defined medically as fewer than three stools per week and severe constipation as less   than one stool per week.  One of the most common issues patients have following surgery is constipation.  Even if you have a regular bowel pattern at home, your normal regimen is likely to be disrupted due to multiple reasons following surgery.  Combination of anesthesia, postoperative narcotics, change in appetite and fluid intake all can  affect your bowels.  In order to avoid complications following surgery, here are some recommendations in order to help you during your recovery period.  Colace (docusate) - Pick up an over-the-counter form of Colace or another stool softener and take twice a day as long as you are requiring postoperative pain medications.  Take with a full glass of water daily.  If you experience loose stools or diarrhea, hold the colace until you stool forms back up.  If your symptoms do not get better within 1 week or if they get worse, check with your doctor. Dulcolax (bisacodyl) - Pick up over-the-counter and take as directed by the product packaging as needed to assist with the movement of your bowels.  Take with a full glass of water.  Use this product as needed if not relieved by Colace only.  MiraLax (polyethylene glycol) - Pick up over-the-counter to have on hand.  MiraLax is a solution that will increase the amount of water in your bowels to assist with bowel movements.  Take as directed and can mix with a glass of water, juice, soda, coffee, or tea.  Take if you go more than two days without a movement.Do not use MiraLax more than once per day. Call your doctor if you are still constipated or irregular after using this medication for 7 days in a row.  If you continue to have problems with postoperative constipation, please contact the office for further assistance and recommendations.  If you experience "the worst abdominal pain ever" or develop nausea or vomiting, please contact the office immediatly for further recommendations for treatment.  ITCHING  If you experience itching with your medications, try taking only a single pain pill, or even half a pain pill at a time.  You can also use Benadryl over the counter for itching or also to help with sleep.   MEDICATIONS See your medication summary on the "After Visit Summary" that the nursing staff will review with you prior to discharge.  You may have some home  medications which will be placed on hold until you complete the course of blood thinner medication.  It is important for you to complete the blood thinner medication as prescribed by your surgeon.  Continue your approved medications as instructed at time of discharge.  PRECAUTIONS If you experience chest pain or shortness of breath - call 911 immediately for transfer to the hospital emergency department.  If you develop a fever greater that 101 F, purulent drainage from wound, increased redness or drainage from wound, foul odor from the wound/dressing, or calf pain - CONTACT YOUR SURGEON.                                                   FOLLOW-UP APPOINTMENTS Make sure you keep all of your appointments after your operation with your surgeon and caregivers. You should call the office at the above phone number and make an appointment for approximately two weeks after the date of your surgery or on the date   instructed by your surgeon outlined in the "After Visit Summary".  RANGE OF MOTION AND STRENGTHENING EXERCISES  These exercises are designed to help you keep full movement of your hip joint. Follow your caregiver's or physical therapist's instructions. Perform all exercises about fifteen times, three times per day or as directed. Exercise both hips, even if you have had only one joint replacement. These exercises can be done on a training (exercise) mat, on the floor, on a table or on a bed. Use whatever works the best and is most comfortable for you. Use music or television while you are exercising so that the exercises are a pleasant break in your day. This will make your life better with the exercises acting as a break in routine you can look forward to.  Lying on your back, slowly slide your foot toward your buttocks, raising your knee up off the floor. Then slowly slide your foot back down until your leg is straight again.  Lying on your back spread your legs as far apart as you can without causing  discomfort.  Lying on your side, raise your upper leg and foot straight up from the floor as far as is comfortable. Slowly lower the leg and repeat.  Lying on your back, tighten up the muscle in the front of your thigh (quadriceps muscles). You can do this by keeping your leg straight and trying to raise your heel off the floor. This helps strengthen the largest muscle supporting your knee.  Lying on your back, tighten up the muscles of your buttocks both with the legs straight and with the knee bent at a comfortable angle while keeping your heel on the floor.   POST-OPERATIVE OPIOID TAPER INSTRUCTIONS: It is important to wean off of your opioid medication as soon as possible. If you do not need pain medication after your surgery it is ok to stop day one. Opioids include: Codeine, Hydrocodone(Norco, Vicodin), Oxycodone(Percocet, oxycontin) and hydromorphone amongst others.  Long term and even short term use of opiods can cause: Increased pain response Dependence Constipation Depression Respiratory depression And more.  Withdrawal symptoms can include Flu like symptoms Nausea, vomiting And more Techniques to manage these symptoms Hydrate well Eat regular healthy meals Stay active Use relaxation techniques(deep breathing, meditating, yoga) Do Not substitute Alcohol to help with tapering If you have been on opioids for less than two weeks and do not have pain than it is ok to stop all together.  Plan to wean off of opioids This plan should start within one week post op of your joint replacement. Maintain the same interval or time between taking each dose and first decrease the dose.  Cut the total daily intake of opioids by one tablet each day Next start to increase the time between doses. The last dose that should be eliminated is the evening dose.   IF YOU ARE TRANSFERRED TO A SKILLED REHAB FACILITY If the patient is transferred to a skilled rehab facility following release from the  hospital, a list of the current medications will be sent to the facility for the patient to continue.  When discharged from the skilled rehab facility, please have the facility set up the patient's Home Health Physical Therapy prior to being released. Also, the skilled facility will be responsible for providing the patient with their medications at time of release from the facility to include their pain medication, the muscle relaxants, and their blood thinner medication. If the patient is still at the rehab facility at   time of the two week follow up appointment, the skilled rehab facility will also need to assist the patient in arranging follow up appointment in our office and any transportation needs.  MAKE SURE YOU:  Understand these instructions.  Get help right away if you are not doing well or get worse.    DENTAL ANTIBIOTICS:  In most cases prophylactic antibiotics for Dental procdeures after total joint surgery are not necessary.  Exceptions are as follows:  1. History of prior total joint infection  2. Severely immunocompromised (Organ Transplant, cancer chemotherapy, Rheumatoid biologic meds such as Humera)  3. Poorly controlled diabetes (A1C &gt; 8.0, blood glucose over 200)  If you have one of these conditions, contact your surgeon for an antibiotic prescription, prior to your dental procedure.    Pick up stool softner and laxative for home use following surgery while on pain medications. Do not submerge incision under water. Please use good hand washing techniques while changing dressing each day. May shower starting three days after surgery. Please use a clean towel to pat the incision dry following showers. Continue to use ice for pain and swelling after surgery. Do not use any lotions or creams on the incision until instructed by your surgeon.  

## 2021-11-18 NOTE — Plan of Care (Signed)
Plan of care discussed.   

## 2021-11-18 NOTE — Op Note (Signed)
OPERATIVE REPORT- TOTAL HIP ARTHROPLASTY   PREOPERATIVE DIAGNOSIS: Osteoarthritis of the Right hip.   POSTOPERATIVE DIAGNOSIS: Osteoarthritis of the Right  hip.   PROCEDURE: Right total hip arthroplasty, anterior approach.   SURGEON: Gaynelle Arabian, MD   ASSISTANT: Theresa Duty, PA-C  ANESTHESIA:  Spinal  ESTIMATED BLOOD LOSS:-250 mL    DRAINS: Hemovac x1.   COMPLICATIONS: None   CONDITION: PACU - hemodynamically stable.   BRIEF CLINICAL NOTE: Andrea Martinez is a 75 y.o. female who has advanced end-  stage arthritis of their Right  hip with progressively worsening pain and  dysfunction.The patient has failed nonoperative management and presents for  total hip arthroplasty.   PROCEDURE IN DETAIL: After successful administration of spinal  anesthetic, the traction boots for the [facility] bed were placed on both  feet and the patient was placed onto the [facility] bed, boots placed into the leg  holders. The Right hip was then isolated from the perineum with plastic  drapes and prepped and draped in the usual sterile fashion. ASIS and  greater trochanter were marked and a oblique incision was made, starting  at about 1 cm lateral and 2 cm distal to the ASIS and coursing towards  the anterior cortex of the femur. The skin was cut with a 10 blade  through subcutaneous tissue to the level of the fascia overlying the  tensor fascia lata muscle. The fascia was then incised in line with the  incision at the junction of the anterior third and posterior 2/3rd. The  muscle was teased off the fascia and then the interval between the TFL  and the rectus was developed. The Hohmann retractor was then placed at  the top of the femoral neck over the capsule. The vessels overlying the  capsule were cauterized and the fat on top of the capsule was removed.  A Hohmann retractor was then placed anterior underneath the rectus  femoris to give exposure to the entire anterior capsule. A T-shaped   capsulotomy was performed. The edges were tagged and the femoral head  was identified.       Osteophytes are removed off the superior acetabulum.  The femoral neck was then cut in situ with an oscillating saw. Traction  was then applied to the left lower extremity utilizing the [facility]  traction. The femoral head was then removed. Retractors were placed  around the acetabulum and then circumferential removal of the labrum was  performed. Osteophytes were also removed. Reaming starts at 47 mm to  medialize and  Increased in 2 mm increments to 49 mm. We reamed in  approximately 40 degrees of abduction, 20 degrees anteversion. A 50 mm  pinnacle acetabular shell was then impacted in anatomic position under  fluoroscopic guidance with excellent purchase. We did not need to place  any additional dome screws. A 32 mm neutral + 4 marathon liner was then  placed into the acetabular shell.       The femoral lift was then placed along the lateral aspect of the femur  just distal to the vastus ridge. The leg was  externally rotated and capsule  was stripped off the inferior aspect of the femoral neck down to the  level of the lesser trochanter, this was done with electrocautery. The femur was lifted after this was performed. The  leg was then placed in an extended and adducted position essentially delivering the femur. We also removed the capsule superiorly and the piriformis from the piriformis  fossa to gain excellent exposure of the  proximal femur. Rongeur was used to remove some cancellous bone to get  into the lateral portion of the proximal femur for placement of the  initial starter reamer. The starter broaches was placed  the starter broach  and was shown to go down the center of the canal. Broaching  with the Actis system was then performed starting at size 0  coursing  Up to size 6. A size 6 had excellent torsional and rotational  and axial stability. The trial standard offset neck was then  placed  with a 32 + 1 trial head. The hip was then reduced. We confirmed that  the stem was in the canal both on AP and lateral x-rays. It also has excellent sizing. The hip was reduced with outstanding stability through full extension and full external rotation.. AP pelvis was taken and the leg lengths were measured and found to be equal. Hip was then dislocated again and the femoral head and neck removed. The  femoral broach was removed. Size 6 Actis stem with a standard offset  neck was then impacted into the femur following native anteversion. Has  excellent purchase in the canal. Excellent torsional and rotational and  axial stability. It is confirmed to be in the canal on AP and lateral  fluoroscopic views. The 32 + 1 metal head was placed and the hip  reduced with outstanding stability. Again AP pelvis was taken and it  confirmed that the leg lengths were equal. The wound was then copiously  irrigated with saline solution and the capsule reattached and repaired  with Ethibond suture. 30 ml of .25% Bupivicaine was  injected into the capsule and into the edge of the tensor fascia lata as well as subcutaneous tissue. The fascia overlying the tensor fascia lata was then closed with a running #1 V-Loc. Subcu was closed with interrupted 2-0 Vicryl and subcuticular running 4-0 Monocryl. Incision was cleaned  and dried. Steri-Strips and a bulky sterile dressing applied. The patient was awakened and transported to  recovery in stable condition.        Please note that a surgical assistant was a medical necessity for this procedure to perform it in a safe and expeditious manner. Assistant was necessary to provide appropriate retraction of vital neurovascular structures and to prevent femoral fracture and allow for anatomic placement of the prosthesis.  Gaynelle Arabian, M.D.

## 2021-11-18 NOTE — Anesthesia Procedure Notes (Signed)
Date/Time: 11/18/2021 2:06 PM Performed by: Talbot Grumbling, CRNA Pre-anesthesia Checklist: Patient identified, Emergency Drugs available, Suction available and Patient being monitored Oxygen Delivery Method: Simple face mask

## 2021-11-18 NOTE — Transfer of Care (Signed)
Immediate Anesthesia Transfer of Care Note  Patient: Andrea Martinez  Procedure(s) Performed: TOTAL HIP ARTHROPLASTY ANTERIOR APPROACH (Right: Hip)  Patient Location: PACU  Anesthesia Type:Spinal  Level of Consciousness: sedated  Airway & Oxygen Therapy: Patient Spontanous Breathing and Patient connected to face mask oxygen  Post-op Assessment: Report given to RN and Post -op Vital signs reviewed and stable  Post vital signs: Reviewed and stable  Last Vitals:  Vitals Value Taken Time  BP 122/52 11/18/21 1545  Temp    Pulse 66 11/18/21 1546  Resp 14 11/18/21 1546  SpO2 100 % 11/18/21 1546  Vitals shown include unvalidated device data.  Last Pain:  Vitals:   11/18/21 1216  TempSrc:   PainSc: 6       Patients Stated Pain Goal: 4 (37/94/32 [id number])  Complications: No notable events documented.

## 2021-11-18 NOTE — Anesthesia Procedure Notes (Addendum)
Spinal  Patient location during procedure: OR Start time: 11/18/2021 2:09 PM End time: 11/18/2021 2:12 PM Reason for block: surgical anesthesia Staffing Performed: anesthesiologist  Anesthesiologist: Freddrick March, MD Preanesthetic Checklist Completed: patient identified, IV checked, risks and benefits discussed, surgical consent, monitors and equipment checked, pre-op evaluation and timeout performed Spinal Block Patient position: sitting Prep: DuraPrep and site prepped and draped Patient monitoring: cardiac monitor, continuous pulse ox and blood pressure Approach: midline Location: L3-4 Injection technique: single-shot Needle Needle type: Pencan  Needle gauge: 24 G Needle length: 9 cm Assessment Sensory level: T6 Events: CSF return and second provider Additional Notes Functioning IV was confirmed and monitors were applied. Sterile prep and drape, including hand hygiene and sterile gloves were used. The patient was positioned and the spine was prepped. The skin was anesthetized with lidocaine.  Free flow of clear CSF was obtained prior to injecting local anesthetic into the CSF.  The spinal needle aspirated freely following injection.  The needle was carefully withdrawn.  The patient tolerated the procedure well.

## 2021-11-18 NOTE — Anesthesia Postprocedure Evaluation (Signed)
Anesthesia Post Note  Patient: Andrea Martinez  Procedure(s) Performed: TOTAL HIP ARTHROPLASTY ANTERIOR APPROACH (Right: Hip)     Patient location during evaluation: PACU Anesthesia Type: Spinal Level of consciousness: oriented and awake and alert Pain management: pain level controlled Vital Signs Assessment: post-procedure vital signs reviewed and stable Respiratory status: spontaneous breathing, respiratory function stable and patient connected to nasal cannula oxygen Cardiovascular status: blood pressure returned to baseline and stable Postop Assessment: no headache, no backache and no apparent nausea or vomiting Anesthetic complications: no   No notable events documented.  Last Vitals:  Vitals:   11/18/21 1600 11/18/21 1615  BP: 133/61 130/63  Pulse: 65 68  Resp: 20 15  Temp:    SpO2: 98% 98%    Last Pain:  Vitals:   11/18/21 1650  TempSrc:   PainSc: 5     LLE Motor Response: Purposeful movement (11/18/21 1645) LLE Sensation: Increased (11/18/21 1645) RLE Motor Response: Purposeful movement (11/18/21 1645) RLE Sensation: Increased (11/18/21 1645) L Sensory Level: S1-Sole of foot, small toes (11/18/21 1645) R Sensory Level: S1-Sole of foot, small toes (11/18/21 1645)  Aadya Kindler L Luvinia Lucy

## 2021-11-19 ENCOUNTER — Encounter (HOSPITAL_COMMUNITY): Payer: Self-pay | Admitting: Orthopedic Surgery

## 2021-11-19 DIAGNOSIS — M1611 Unilateral primary osteoarthritis, right hip: Secondary | ICD-10-CM | POA: Diagnosis not present

## 2021-11-19 LAB — CBC
HCT: 31.9 % — ABNORMAL LOW (ref 36.0–46.0)
Hemoglobin: 9.8 g/dL — ABNORMAL LOW (ref 12.0–15.0)
MCH: 28.4 pg (ref 26.0–34.0)
MCHC: 30.7 g/dL (ref 30.0–36.0)
MCV: 92.5 fL (ref 80.0–100.0)
Platelets: 186 10*3/uL (ref 150–400)
RBC: 3.45 MIL/uL — ABNORMAL LOW (ref 3.87–5.11)
RDW: 13.4 % (ref 11.5–15.5)
WBC: 13.1 10*3/uL — ABNORMAL HIGH (ref 4.0–10.5)
nRBC: 0 % (ref 0.0–0.2)

## 2021-11-19 LAB — BASIC METABOLIC PANEL
Anion gap: 6 (ref 5–15)
Anion gap: 7 (ref 5–15)
BUN: 26 mg/dL — ABNORMAL HIGH (ref 8–23)
BUN: 26 mg/dL — ABNORMAL HIGH (ref 8–23)
CO2: 24 mmol/L (ref 22–32)
CO2: 24 mmol/L (ref 22–32)
Calcium: 8.4 mg/dL — ABNORMAL LOW (ref 8.9–10.3)
Calcium: 8.4 mg/dL — ABNORMAL LOW (ref 8.9–10.3)
Chloride: 107 mmol/L (ref 98–111)
Chloride: 106 mmol/L (ref 98–111)
Creatinine, Ser: 0.98 mg/dL (ref 0.44–1.00)
Creatinine, Ser: 0.92 mg/dL (ref 0.44–1.00)
GFR, Estimated: 60 mL/min — ABNORMAL LOW (ref >60)
GFR, Estimated: >60 mL/min (ref >60)
Glucose, Bld: 159 mg/dL — ABNORMAL HIGH (ref 70–99)
Glucose, Bld: 168 mg/dL — ABNORMAL HIGH (ref 70–99)
Potassium: 5.4 mmol/L — ABNORMAL HIGH (ref 3.5–5.1)
Potassium: 5.2 mmol/L — ABNORMAL HIGH (ref 3.5–5.1)
Sodium: 137 mmol/L (ref 135–145)
Sodium: 137 mmol/L (ref 135–145)

## 2021-11-19 MED ORDER — HYDROCODONE-ACETAMINOPHEN 5-325 MG PO TABS
1.0000 | ORAL_TABLET | Freq: Four times a day (QID) | ORAL | 0 refills | Status: DC | PRN
Start: 2021-11-19 — End: 2022-01-12

## 2021-11-19 MED ORDER — CHLORHEXIDINE GLUCONATE CLOTH 2 % EX PADS: 6.0000 | MEDICATED_PAD | Freq: Every day | CUTANEOUS | Status: DC

## 2021-11-19 MED ORDER — ASPIRIN 325 MG PO TBEC: 325.0000 mg | DELAYED_RELEASE_TABLET | Freq: Two times a day (BID) | ORAL | 0 refills | Status: AC

## 2021-11-19 MED ORDER — ASPIRIN EC 325 MG PO TBEC
325.0000 mg | DELAYED_RELEASE_TABLET | Freq: Two times a day (BID) | ORAL | Status: DC
  Administered 2021-11-19: 325 mg via ORAL
  Filled 2021-11-19: qty 1

## 2021-11-19 MED ORDER — METHOCARBAMOL 500 MG PO TABS
500.0000 mg | ORAL_TABLET | Freq: Four times a day (QID) | ORAL | 0 refills | Status: DC | PRN
Start: 2021-11-19 — End: 2022-01-12

## 2021-11-19 NOTE — Progress Notes (Signed)
Physical Therapy Treatment Patient Details Name: Andrea Martinez MRN: [id number] DOB: 02/19/1946 Today's Date: 11/19/2021   History of Present Illness 75 y.o. female admitted 11/18/21 for R AA-THA. PMH includes: anxiety B TKA, OA, leukemia, pleaural effusion, UTI, UI, TLIF L5-S1 February 2022.    PT Comments    Pt is progressing well with mobility. She ambulated 120' with RW, no loss of balance, and demonstrates understanding of THA HEP. She is ready to DC home from a PT standpoint.     Recommendations for follow up therapy are one component of a multi-disciplinary discharge planning process, led by the attending physician.  Recommendations may be updated based on patient status, additional functional criteria and insurance authorization.  Follow Up Recommendations  Follow physician's recommendations for discharge plan and follow up therapies     Assistance Recommended at Discharge Intermittent Supervision/Assistance  Equipment Recommendations  None recommended by PT    Recommendations for Other Services       Precautions / Restrictions Precautions Precautions: Fall Restrictions Weight Bearing Restrictions: No RLE Weight Bearing: Weight bearing as tolerated     Mobility  Bed Mobility Overal bed mobility: Needs Assistance Bed Mobility: Supine to Sit     Supine to sit: Min assist     General bed mobility comments: up in recliner    Transfers Overall transfer level: Needs assistance Equipment used: Rolling walker (2 wheels) Transfers: Sit to/from Stand Sit to Stand: Supervision           General transfer comment: VCs hand placement    Ambulation/Gait Ambulation/Gait assistance: Supervision Gait Distance (Feet): 120 Feet Assistive device: Rolling walker (2 wheels) Gait Pattern/deviations: Step-to pattern;Decreased step length - right;Decreased step length - left Gait velocity: decr     General Gait Details: steady, no loss of balance, distance limited by  pain and fatigue   Stairs Stairs:  (pt/family declined stair training, stated step to enter is very shallow and they are comfortable with getting her into home. REviewed technique verbally.)           Wheelchair Mobility    Modified Rankin (Stroke Patients Only)       Balance Overall balance assessment: Modified Independent                                          Cognition Arousal/Alertness: Awake/alert Behavior During Therapy: WFL for tasks assessed/performed Overall Cognitive Status: Within Functional Limits for tasks assessed                                          Exercises Total Joint Exercises Ankle Circles/Pumps: AROM;Both;10 reps;Supine Quad Sets: AROM;Both;Supine;10 reps Short Arc QuadSinclair Martinez;Right;Supine;10 reps Heel Slides: AAROM;Right;Supine;10 reps Hip ABduction/ADduction: AAROM;Right;5 reps;Supine Long Arc Quad: AROM;Right;10 reps;Seated    General Comments        Pertinent Vitals/Pain Pain Assessment: 0-10 Pain Score: 6  Pain Location: R hip Pain Descriptors / Indicators: Sore;Sharp;Grimacing;Guarding;Operative site guarding;Crying Pain Intervention(s): Limited activity within patient's tolerance;Monitored during session;Premedicated before session;Ice applied    Home Living                          Prior Function            PT Goals (current goals can now be  found in the care plan section) Acute Rehab PT Goals Patient Stated Goal: to wash dishes, go [place] 6 [place] PT Goal Formulation: With patient/family Time For Goal Achievement: 11/26/21 Potential to Achieve Goals: Good Progress towards PT goals: Progressing toward goals    Frequency    7X/week      PT Plan Current plan remains appropriate    Co-evaluation              AM-PAC PT "6 Clicks" Mobility   Outcome Measure  Help needed turning from your back to your side while in a flat bed without using  bedrails?: A Little Help needed moving from lying on your back to sitting on the side of a flat bed without using bedrails?: A Little Help needed moving to and from a bed to a chair (including a wheelchair)?: A Little Help needed standing up from a chair using your arms (e.g., wheelchair or bedside chair)?: A Little Help needed to walk in hospital room?: A Little Help needed climbing 3-5 steps with a railing? : A Little 6 Click Score: 18    End of Session Equipment Utilized During Treatment: Gait belt Activity Tolerance: Patient tolerated treatment well Patient left: in chair;with call bell/phone within reach;with chair alarm set;with family/visitor present Nurse Communication: Mobility status PT Visit Diagnosis: Difficulty in walking, not elsewhere classified (R26.2);Pain;Muscle weakness (generalized) (M62.81) Pain - Right/Left: Right Pain - part of body: Hip     Time: 2023-3435 PT Time Calculation (min) (ACUTE ONLY): 25 min  Charges:  $Gait Training: 8-22 mins $Therapeutic Exercise: 8-22 mins                     Andrea Martinez PT 11/19/2021  Acute Rehabilitation Services Pager [phone] Office [phone]

## 2021-11-19 NOTE — Progress Notes (Signed)
Subjective: 1 Day Post-Op Procedure(s) (LRB): TOTAL HIP ARTHROPLASTY ANTERIOR APPROACH (Right) Patient reports pain as mild.   Patient seen in rounds by Dr. Wynelle Link. Patient is well, and has had no acute complaints or problems other than discomfort in the right hip. Denies chest pain or SOB. No issues overnight. Foley removed this AM. We will begin therapy today.   Objective: Vital signs in last 24 hours: Temp:  [97.5 F (36.4 C)-98.6 F (37 C)] 97.6 F (36.4 C) (12/08 0602) Pulse Rate:  [60-80] 63 (12/08 0602) Resp:  [12-20] 17 (12/08 0602) BP: (122-162)/(52-78) 153/64 (12/08 0602) SpO2:  [98 %-100 %] 100 % (12/08 0602) Weight:  [91.8 kg] 91.8 kg (12/07 1142)  Intake/Output from previous day:  Intake/Output Summary (Last 24 hours) at 11/19/2021 0738 Last data filed at 11/19/2021 0602 Gross per 24 hour  Intake 3182.71 ml  Output 2110 ml  Net 1072.71 ml     Intake/Output this shift: No intake/output data recorded.  Labs: Recent Labs    11/18/21 1158 11/19/21 0346  HGB 12.5 9.8*   Recent Labs    11/18/21 1158 11/19/21 0346  WBC 11.0* 13.1*  RBC 4.42 3.45*  HCT 41.3 31.9*  PLT 221 186   Recent Labs    11/18/21 1158 11/19/21 0346  NA 138 137  K 4.4 5.4*  CL 106 107  CO2 25 24  BUN 30* 26*  CREATININE 1.03* 0.98  GLUCOSE 110* 159*  CALCIUM 8.9 8.4*   No results for input(s): LABPT, INR in the last 72 hours.  Exam: General - Patient is Alert and Oriented Extremity - Neurologically intact Neurovascular intact Sensation intact distally Dorsiflexion/Plantar flexion intact Dressing - dressing C/D/I Motor Function - intact, moving foot and toes well on exam.   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 Elyse Hsu   Lipoma    left  upper arm   Mixed hyperlipidemia    Osteoporosis    Pericardial effusion    a. HCAP complicated by pericardial effusion requiring pericardial window 10/2016 and large L pleural effusion requring VATS.   Pleural effusion    a. s/p VATS 2017.   Pneumonia 10/2016   PONV (postoperative nausea and vomiting)    history of   Prolonged QT interval    Urine incontinence    UTI (lower urinary tract infection)    frequent   Vitamin D deficiency     Assessment/Plan: 1 Day Post-Op Procedure(s) (LRB): TOTAL HIP ARTHROPLASTY ANTERIOR APPROACH (Right) Principal Problem:   OA (osteoarthritis) of hip Active Problems:   Primary osteoarthritis of right hip  Estimated body mass index is 33.68 kg/m as calculated from the following:   Height as of this encounter: 5\' 5"  (1.651 m).   Weight as of this encounter: 91.8 kg. Advance diet Up with therapy D/C IV fluids  DVT Prophylaxis - Aspirin Weight bearing as tolerated. Begin therapy.  Potassium 5.4 this AM. Will recheck with repeat BMP to rule out hemolysis.   Plan is to go Home after hospital stay. Possible discharge this afternoon if progresses with therapy and meeting her goals. HEP Follow-up in the office in 2 weeks  The PDMP database was reviewed today prior to any opioid medications being prescribed  to this patient.  Theresa Duty, PA-C Orthopedic Surgery [phone] 11/19/2021, 7:38 AM

## 2021-11-19 NOTE — Evaluation (Signed)
Physical Therapy Evaluation Patient Details Name: Andrea Martinez MRN: [id number] DOB: November 19, 1946 Today's Date: 11/19/2021  History of Present Illness  75 y.o. female admitted 11/18/21 for R AA-THA. PMH includes: anxiety B TKA, OA, leukemia, pleaural effusion, UTI, UI, TLIF L5-S1 February 2022.  Clinical Impression  Pt is s/p THA resulting in the deficits listed below (see PT Problem List). Pt ambulated 29' with RW, no loss of balance. Initiated THA HEP. Good progress expected.  Pt will benefit from skilled PT to increase their independence and safety with mobility to allow discharge to the venue listed below.         Recommendations for follow up therapy are one component of a multi-disciplinary discharge planning process, led by the attending physician.  Recommendations may be updated based on patient status, additional functional criteria and insurance authorization.  Follow Up Recommendations Follow physician's recommendations for discharge plan and follow up therapies    Assistance Recommended at Discharge Intermittent Supervision/Assistance  Functional Status Assessment Patient has had a recent decline in their functional status and demonstrates the ability to make significant improvements in function in a reasonable and predictable amount of time.  Equipment Recommendations  None recommended by PT    Recommendations for Other Services       Precautions / Restrictions Precautions Precautions: Fall Restrictions Weight Bearing Restrictions: No RLE Weight Bearing: Weight bearing as tolerated      Mobility  Bed Mobility Overal bed mobility: Needs Assistance Bed Mobility: Supine to Sit     Supine to sit: Min assist     General bed mobility comments: used gait belt as RLE lifter    Transfers Overall transfer level: Needs assistance Equipment used: Rolling walker (2 wheels) Transfers: Sit to/from Stand Sit to Stand: Min assist;From elevated surface           General  transfer comment: VCs hand placement, min A to power up    Ambulation/Gait Ambulation/Gait assistance: Min guard Gait Distance (Feet): 85 Feet Assistive device: Rolling walker (2 wheels) Gait Pattern/deviations: Step-to pattern;Decreased step length - right;Decreased step length - left Gait velocity: decr     General Gait Details: steady, no loss of balance, distance limited by pain and fatigue  Stairs            Wheelchair Mobility    Modified Rankin (Stroke Patients Only)       Balance Overall balance assessment: Modified Independent                                           Pertinent Vitals/Pain Pain Assessment: 0-10 Pain Score: 7  Pain Location: R hip Pain Descriptors / Indicators: Sore;Sharp;Grimacing;Guarding;Operative site guarding;Crying Pain Intervention(s): Limited activity within patient's tolerance;Monitored during session;Premedicated before session;Ice applied;Repositioned    Home Living Family/patient expects to be discharged to:: Private residence Living Arrangements: Spouse/significant other Available Help at Discharge: Family;Available 24 hours/day Type of Home: House Home Access: Stairs to enter Entrance Stairs-Rails: None Entrance Stairs-Number of Steps: 1   Home Layout: Laundry or work area in basement;Able to live on main level with bedroom/bathroom Home Equipment: Conservation officer, nature (2 wheels);Rollator (4 wheels);Wheelchair - manual;BSC/3in1;Toilet riser      Prior Function Prior Level of Function : Independent/Modified Independent             Mobility Comments: used RW to go out, denied falls in past 6 months  Hand Dominance   Dominant Hand: Right    Extremity/Trunk Assessment   Upper Extremity Assessment Upper Extremity Assessment: Overall WFL for tasks assessed    Lower Extremity Assessment Lower Extremity Assessment: RLE deficits/detail RLE Deficits / Details: R hip ~+2/5 RLE Sensation: WNL RLE  Coordination: WNL    Cervical / Trunk Assessment Cervical / Trunk Assessment: Kyphotic  Communication   Communication: HOH  Cognition Arousal/Alertness: Awake/alert Behavior During Therapy: WFL for tasks assessed/performed                                            General Comments      Exercises Total Joint Exercises Ankle Circles/Pumps: AROM;Both;10 reps;Supine Quad Sets: AROM;Both;Supine;10 reps Short Arc QuadSinclair Ship;Right;5 reps;Supine Heel Slides: AAROM;Right;5 reps;Supine Hip ABduction/ADduction: AAROM;Right;5 reps;Supine   Assessment/Plan    PT Assessment Patient needs continued PT services  PT Problem List Decreased mobility;Pain;Decreased balance;Decreased activity tolerance       PT Treatment Interventions Therapeutic activities;DME instruction;Functional mobility training;Patient/family education;Therapeutic exercise;Stair training    PT Goals (Current goals can be found in the Care Plan section)  Acute Rehab PT Goals Patient Stated Goal: decrease pain PT Goal Formulation: With patient/family Time For Goal Achievement: 11/26/21 Potential to Achieve Goals: Good    Frequency 7X/week   Barriers to discharge        Co-evaluation               AM-PAC PT "6 Clicks" Mobility  Outcome Measure Help needed turning from your back to your side while in a flat bed without using bedrails?: A Little Help needed moving from lying on your back to sitting on the side of a flat bed without using bedrails?: A Little Help needed moving to and from a bed to a chair (including a wheelchair)?: A Little Help needed standing up from a chair using your arms (e.g., wheelchair or bedside chair)?: A Lot Help needed to walk in hospital room?: A Little Help needed climbing 3-5 steps with a railing? : A Lot 6 Click Score: 16    End of Session Equipment Utilized During Treatment: Gait belt Activity Tolerance: Patient tolerated treatment well Patient left:  in chair;with call bell/phone within reach;with chair alarm set Nurse Communication: Mobility status PT Visit Diagnosis: Difficulty in walking, not elsewhere classified (R26.2);Pain;Muscle weakness (generalized) (M62.81) Pain - Right/Left: Right Pain - part of body: Hip    Time: 0093-8182 PT Time Calculation (min) (ACUTE ONLY): 27 min   Charges:   PT Evaluation $PT Eval Moderate Complexity: 1 Mod PT Treatments $Gait Training: 8-22 mins       Blondell Reveal Kistler PT 11/19/2021  Acute Rehabilitation Services Pager [phone] Office [phone]

## 2021-11-19 NOTE — TOC Transition Note (Signed)
Transition of Care [facility]) - CM/SW Discharge Note  Patient Details  Name: Andrea Martinez MRN: [id number] Date of Birth: 07-08-1946  [facility]) CM/SW Contact:  Sherie Don, LCSW Phone Number: 11/19/2021, 9:51 AM  Clinical Narrative: Patient is expected to discharge home after working with PT. CSW met with patient, husband, and daughter to confirm discharge plan. Patient will discharge home with a home exercise program (HEP). Patient has a rolling walker and toilet riser at home; daughter can borrow a 3N1, so there are no DME needs at this time. TOC signing off.  Final next level of care: Home/Self Care Barriers to Discharge: No Barriers Identified  Patient Goals and CMS Choice Patient states their goals for this hospitalization and ongoing recovery are:: Discharge home with HEP Choice offered to / list presented to : NA  Discharge Plan and Services       DME Arranged: N/A DME Agency: NA HH Arranged: NA HH Agency: NA  Readmission Risk Interventions No flowsheet data found.

## 2021-11-19 NOTE — Progress Notes (Signed)
D/C instructions given to patient. Patient has no questions. NT or RN will wheel patient out once she is dressed

## 2021-11-19 NOTE — Plan of Care (Signed)
  Problem: Clinical Measurements: Goal: Ability to maintain clinical measurements within normal limits will improve Outcome: Progressing   Problem: Activity: Goal: Risk for activity intolerance will decrease Outcome: Progressing   Problem: Pain Managment: Goal: General experience of comfort will improve Outcome: Progressing   

## 2021-11-19 NOTE — Care Plan (Signed)
Ortho Bundle Case Management Note  Patient Details  Name: Andrea Martinez MRN: [id number] Date of Birth: 12-19-1945  R THA on 11-18-21 DCP:  Home with husband DME:  No needs.  Has a RW. PT:  HEP                   DME Arranged:  N/A DME Agency:  NA  HH Arranged:  NA HH Agency:  NA  Additional Comments: Please contact me with any questions of if this plan should need to change.  Marianne Sofia, RN,CCM [facility]  [phone] 11/19/2021, 7:47 AM

## 2021-11-20 ENCOUNTER — Telehealth (HOSPITAL_COMMUNITY): Payer: Self-pay | Admitting: Pharmacy Technician

## 2021-11-23 NOTE — Discharge Summary (Signed)
Physician Discharge Summary   Patient ID: Andrea Martinez MRN: [id number] DOB/AGE: [birth year] 75 y.o.  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 Elyse Hsu   Lipoma    left upper arm   Mixed hyperlipidemia    Osteoporosis    Pericardial effusion    a. HCAP complicated by pericardial effusion requiring pericardial window 10/2016 and large L pleural effusion requring VATS.   Pleural effusion    a. s/p VATS 2017.   Pneumonia 10/2016   PONV (postoperative nausea and vomiting)    history of   Prolonged QT interval    Urine incontinence    UTI (lower urinary tract infection)    frequent   Vitamin D deficiency    Discharge Diagnoses:   Principal Problem:   OA (osteoarthritis) of hip Active Problems:   Primary osteoarthritis of right hip  Estimated body mass index is 33.68 kg/m as calculated from the following:   Height as of this encounter: '5\' 5"'  (1.651 m).   Weight as of this encounter: 91.8 kg.  Procedure:  Procedure(s) (LRB): TOTAL HIP ARTHROPLASTY ANTERIOR APPROACH (Right)   Consults: None  HPI: Andrea Martinez is a 75 y.o. female who has advanced end- stage arthritis of their Right  hip with progressively worsening pain and dysfunction.The patient has failed nonoperative management and presents for total hip arthroplasty.   Laboratory Data: Admission on 11/18/2021, Discharged on 11/19/2021  Component Date Value Ref Range Status   Sodium 11/18/2021 138  135 - 145 mmol/L Final   Potassium 11/18/2021 4.4  3.5 - 5.1 mmol/L Final   Chloride 11/18/2021 106  98 - 111 mmol/L Final   CO2 11/18/2021 25  22 - 32 mmol/L Final   Glucose, Bld  11/18/2021 110 (H)  70 - 99 mg/dL Final   Glucose reference range applies only to samples taken after fasting for at least 8 hours.   BUN 11/18/2021 30 (H)  8 - 23 mg/dL Final   Creatinine, Ser 11/18/2021 1.03 (H)  0.44 - 1.00 mg/dL Final   Calcium 11/18/2021 8.9  8.9 - 10.3 mg/dL Final   Total Protein 11/18/2021 7.5  6.5 - 8.1 g/dL Final   Albumin 11/18/2021 4.0  3.5 - 5.0 g/dL Final   AST 11/18/2021 28  15 - 41 U/L Final   ALT 11/18/2021 23  0 - 44 U/L Final   Alkaline Phosphatase 11/18/2021 143 (H)  38 - 126 U/L Final   Total Bilirubin 11/18/2021 1.1  0.3 - 1.2 mg/dL Final   GFR, Estimated 11/18/2021 56 (L)  >60 mL/min Final   Comment: (NOTE) Calculated using the CKD-EPI Creatinine Equation (2021)    Anion gap 11/18/2021 7  5 - 15 Final   Performed at [facility], [place] [address] [place], [place] [address]   WBC 11/18/2021 11.0 (H)  4.0 - 10.5 K/uL Final   RBC 11/18/2021 4.42  3.87 - 5.11 MIL/uL Final   Hemoglobin 11/18/2021 12.5  12.0 - 15.0 g/dL Final   HCT 11/18/2021 41.3  36.0 - 46.0 % Final   MCV 11/18/2021 93.4  80.0 - 100.0 fL Final  MCH 11/18/2021 28.3  26.0 - 34.0 pg Final   MCHC 11/18/2021 30.3  30.0 - 36.0 g/dL Final   RDW 11/18/2021 14.0  11.5 - 15.5 % Final   Platelets 11/18/2021 221  150 - 400 K/uL Final   nRBC 11/18/2021 0.0  0.0 - 0.2 % Final   Performed at [facility], [place] [address] [place], [place] [address]   WBC 11/19/2021 13.1 (H)  4.0 - 10.5 K/uL Final   RBC 11/19/2021 3.45 (L)  3.87 - 5.11 MIL/uL Final   Hemoglobin 11/19/2021 9.8 (L)  12.0 - 15.0 g/dL Final   HCT 11/19/2021 31.9 (L)  36.0 - 46.0 % Final   MCV 11/19/2021 92.5  80.0 - 100.0 fL Final   MCH 11/19/2021 28.4  26.0 - 34.0 pg Final   MCHC 11/19/2021 30.7  30.0 - 36.0 g/dL Final   RDW 11/19/2021 13.4  11.5 - 15.5 % Final   Platelets 11/19/2021 186  150 - 400 K/uL Final   nRBC 11/19/2021 0.0  0.0 - 0.2 % Final   Performed at [facility], [place] [address], [place], [place] [address]   Sodium 11/19/2021 137  135 - 145 mmol/L Final   Potassium 11/19/2021 5.4 (H)  3.5 - 5.1 mmol/L Final   DELTA CHECK NOTED   Chloride 11/19/2021 107  98 - 111 mmol/L Final   CO2 11/19/2021 24  22 - 32 mmol/L Final   Glucose, Bld 11/19/2021 159 (H)  70 - 99 mg/dL Final   Glucose reference range applies only to samples taken after fasting for at least 8 hours.   BUN 11/19/2021 26 (H)  8 - 23 mg/dL Final   Creatinine, Ser 11/19/2021 0.98  0.44 - 1.00 mg/dL Final   Calcium 11/19/2021 8.4 (L)  8.9 - 10.3 mg/dL Final   GFR, Estimated 11/19/2021 60 (L)  >60 mL/min Final   Comment: (NOTE) Calculated using the CKD-EPI Creatinine Equation (2021)    Anion gap 11/19/2021 6  5 - 15 Final   Performed at [facility], [place] [address] [place], [place] [address]   Sodium 11/19/2021 137  135 - 145 mmol/L Final   Potassium 11/19/2021 5.2 (H)  3.5 - 5.1 mmol/L Final   Chloride 11/19/2021 106  98 - 111 mmol/L Final   CO2 11/19/2021 24  22 - 32 mmol/L Final   Glucose, Bld 11/19/2021 168 (H)  70 - 99 mg/dL Final   Glucose reference range applies only to samples taken after fasting for at least 8 hours.   BUN 11/19/2021 26 (H)  8 - 23 mg/dL Final   Creatinine, Ser 11/19/2021 0.92  0.44 - 1.00 mg/dL Final   Calcium 11/19/2021 8.4 (L)  8.9 - 10.3 mg/dL Final   GFR, Estimated 11/19/2021 >60  >60 mL/min Final   Comment: (NOTE) Calculated using the CKD-EPI Creatinine Equation (2021)    Anion gap 11/19/2021 7  5 - 15 Final   Performed at [facility], [place] [address], [place],  [address]  Orders Only on 11/16/2021  Component Date Value Ref Range Status   SARS Coronavirus 2 11/16/2021 RESULT: NEGATIVE   Final   Comment: RESULT: NEGATIVESARS-CoV-2 INTERPRETATION:A NEGATIVE  test result means that SARS-CoV-2 RNA was not present in the specimen above the limit of detection of this test. This does not preclude a  possible SARS-CoV-2 infection and should not be used as the  sole basis for patient management decisions. Negative results must be  combined with clinical observations, patient history, and epidemiological information. Optimum specimen types and timing for peak viral levels during infections caused by SARS-CoV-2  have not been determined. Collection of multiple specimens or types of specimens may be necessary to detect virus. Improper specimen collection and handling, sequence variability under primers/probes, or organism present below the limit of detection may  lead to false negative results. Positive and negative predictive values of testing are highly dependent on prevalence. False negative test results are more likely when prevalence of disease is high.The expected result is NEGATIVE.Fact S                          heet for  Healthcare Providers: LocalChronicle.no Sheet for Patients: SalonLookup.es Reference Range - Negative   Hospital Outpatient Visit on 11/11/2021  Component Date Value Ref Range Status   MRSA, PCR 11/11/2021 NEGATIVE  NEGATIVE Final   Staphylococcus aureus 11/11/2021 NEGATIVE  NEGATIVE Final   Comment: (NOTE) The Xpert SA Assay (FDA approved for NASAL specimens in patients 55 years of age and older), is one component of a comprehensive surveillance program. It is not intended to diagnose infection nor to guide or monitor treatment. Performed at [facility], [place] [address] [place], [place] [address]    ABO/RH(D) 11/11/2021 A POS   Final   Antibody Screen 11/11/2021 NEG   Final   Sample Expiration 11/11/2021 11/21/2021,2359   Final   Extend sample reason 11/11/2021    Final                   Value:NO TRANSFUSIONS OR PREGNANCY IN THE PAST 3 MONTHS Performed at [place] [address], [place], [place] [address]    Prothrombin Time 11/11/2021 13.2  11.4 - 15.2 seconds  Final   INR 11/11/2021 1.0  0.8 - 1.2 Final   Comment: (NOTE) INR goal varies based on device and disease states. Performed at [facility], [place] [address], [place], [place] [address]   Appointment on 11/11/2021  Component Date Value Ref Range Status   Specific Gravity, UA 11/11/2021 1.015  1.005 - 1.030 Final   pH, UA 11/11/2021 7.0  5.0 - 7.5 Final   Color, UA 11/11/2021 Yellow  Yellow Final   Appearance Ur 11/11/2021 Clear  Clear Final   Leukocytes,UA 11/11/2021 Negative  Negative Final   Protein,UA 11/11/2021 Negative  Negative/Trace Final   Glucose, UA 11/11/2021 Negative  Negative Final   Ketones, UA 11/11/2021 Negative  Negative Final   RBC, UA 11/11/2021 Negative  Negative Final   Bilirubin, UA 11/11/2021 Negative  Negative Final   Urobilinogen, Ur 11/11/2021 0.2  0.2 - 1.0 mg/dL Final   Nitrite, UA 11/11/2021 Negative  Negative Final   Urine Culture, Routine 11/11/2021 Final report   Final   Organism ID, Bacteria 11/11/2021 Comment   Final   Comment: Mixed urogenital flora 10,000-25,000 colony forming units per mL   Office Visit on 10/30/2021  Component Date Value Ref Range Status   WBC 10/30/2021 14.1 (H)  3.4 - 10.8 x10E3/uL Final   RBC 10/30/2021 4.22  3.77 - 5.28 x10E6/uL Final   Hemoglobin 10/30/2021 11.9  11.1 - 15.9 g/dL Final   Hematocrit 10/30/2021 35.3  34.0 - 46.6 % Final   MCV 10/30/2021 84  79 - 97 fL Final   MCH 10/30/2021 28.2  26.6 - 33.0 pg Final   MCHC 10/30/2021 33.7  31.5 - 35.7 g/dL Final  RDW 10/30/2021 12.6  11.7 - 15.4 % Final   Platelets 10/30/2021 363  150 - 450 x10E3/uL Final   Neutrophils 10/30/2021 79  Not Estab. % Final   Lymphs 10/30/2021 12  Not Estab. % Final   Monocytes 10/30/2021 6  Not Estab. % Final   Eos 10/30/2021 2  Not Estab. % Final   Basos 10/30/2021 0  Not Estab. % Final   Neutrophils Absolute 10/30/2021 11.2 (H)  1.4 - 7.0 x10E3/uL Final   Lymphocytes Absolute 10/30/2021 1.7  0.7 - 3.1 x10E3/uL Final    Monocytes Absolute 10/30/2021 0.9  0.1 - 0.9 x10E3/uL Final   EOS (ABSOLUTE) 10/30/2021 0.2  0.0 - 0.4 x10E3/uL Final   Basophils Absolute 10/30/2021 0.0  0.0 - 0.2 x10E3/uL Final   Immature Granulocytes 10/30/2021 1  Not Estab. % Final   Immature Grans (Abs) 10/30/2021 0.1  0.0 - 0.1 x10E3/uL Final   Glucose 10/30/2021 103 (H)  70 - 99 mg/dL Final   BUN 10/30/2021 21  8 - 27 mg/dL Final   Creatinine, Ser 10/30/2021 0.88  0.57 - 1.00 mg/dL Final   eGFR 10/30/2021 68  >59 mL/min/1.73 Final   BUN/Creatinine Ratio 10/30/2021 24  12 - 28 Final   Sodium 10/30/2021 143  134 - 144 mmol/L Final   Potassium 10/30/2021 4.9  3.5 - 5.2 mmol/L Final   Chloride 10/30/2021 103  96 - 106 mmol/L Final   CO2 10/30/2021 23  20 - 29 mmol/L Final   Calcium 10/30/2021 9.1  8.7 - 10.3 mg/dL Final   Total Protein 10/30/2021 7.1  6.0 - 8.5 g/dL Final   Albumin 10/30/2021 4.5  3.7 - 4.7 g/dL Final   Globulin, Total 10/30/2021 2.6  1.5 - 4.5 g/dL Final   Albumin/Globulin Ratio 10/30/2021 1.7  1.2 - 2.2 Final   Bilirubin Total 10/30/2021 0.5  0.0 - 1.2 mg/dL Final   Alkaline Phosphatase 10/30/2021 174 (H)  44 - 121 IU/L Final   AST 10/30/2021 26  0 - 40 IU/L Final   ALT 10/30/2021 16  0 - 32 IU/L Final   Specific Gravity, UA 10/30/2021 1.020  1.005 - 1.030 Final   pH, UA 10/30/2021 5.5  5.0 - 7.5 Final   Color, UA 10/30/2021 Yellow  Yellow Final   Appearance Ur 10/30/2021 Clear  Clear Final   Leukocytes,UA 10/30/2021 1+ (A)  Negative Final   Protein,UA 10/30/2021 Negative  Negative/Trace Final   Glucose, UA 10/30/2021 Negative  Negative Final   Ketones, UA 10/30/2021 Trace (A)  Negative Final   RBC, UA 10/30/2021 Negative  Negative Final   Bilirubin, UA 10/30/2021 Negative  Negative Final   Urobilinogen, Ur 10/30/2021 0.2  0.2 - 1.0 mg/dL Final   Nitrite, UA 10/30/2021 Negative  Negative Final   Microscopic Examination 10/30/2021 See below:   Final   HB A1C (BAYER DCA - WAIVED) 10/30/2021 5.1  4.8 - 5.6 %  Final   Comment:          Prediabetes: 5.7 - 6.4          Diabetes: >6.4          Glycemic control for adults with diabetes: <7.0    INR 10/30/2021 1.0  0.9 - 1.1 Final   Prothrombin Time 10/30/2021 11.9  sec Final   Comment: Differences in reagents, instruments, and pre-analytical variables can affect prothrombin time results.  These factors should be considered when comparing different prothrombin time test methods. Please Note: This test should not be  used to monitor persons on heparin therapy.    WBC, UA 10/30/2021 0-5  0 - 5 /hpf Final   RBC 10/30/2021 None seen  0 - 2 /hpf Final   Epithelial Cells (non renal) 10/30/2021 0-10  0 - 10 /hpf Final   Renal Epithel, UA 10/30/2021 None seen  None seen /hpf Final   Bacteria, UA 10/30/2021 Few (A)  None seen/Few Final  Office Visit on 10/21/2021  Component Date Value Ref Range Status   Influenza A 10/21/2021 Negative  Negative Final   Influenza B 10/21/2021 Negative  Negative Final   Comment: If the test is negative for the presence of influenza A or influenza B antigen, infection due to influenza cannot be ruled-out because the antigen present in the sample may be below the detection limit of the test. It is recommended that these results be confirmed by viral culture or an FDA-cleared influenza A and B molecular assay.    SARS-CoV-2, NAA 10/21/2021 Detected (A)  Not Detected Final   Comment: Patients who have a positive COVID-19 test result may now have treatment options. Treatment options are available for patients with mild to moderate symptoms and for hospitalized patients. Visit our website at [email or web address] for resources and information. This nucleic acid amplification test was developed and its performance characteristics determined by [employer]. Nucleic acid amplification tests include RT-PCR and TMA. This test has not been FDA cleared or approved. This test has been authorized by FDA under an  Emergency Use Authorization (EUA). This test is only authorized for the duration of time the declaration that circumstances exist justifying the authorization of the emergency use of in vitro diagnostic tests for detection of SARS-CoV-2 virus and/or diagnosis of COVID-19 infection under section 564(b)(1) of the Act, 21 U.S.C. 277OEU-2(P) (1), unless the authorization is terminated or revoked sooner. When diagnostic testing is negativ                          e, the possibility of a false negative result should be considered in the context of a patient's recent exposures and the presence of clinical signs and symptoms consistent with COVID-19. An individual without symptoms of COVID-19 and who is not shedding SARS-CoV-2 virus would expect to have a negative (not detected) result in this assay.    SARS-CoV-2, NAA 2 DAY TAT 10/21/2021 Performed   Final     X-Rays:DG Pelvis Portable  Result Date: 11/18/2021 CLINICAL DATA:  Status post right hip replacement. EXAM: PORTABLE PELVIS 1-2 VIEWS COMPARISON:  None. FINDINGS: Right hip total arthroplasty is in anatomic alignment. There is no acute fracture. Lateral soft tissue swelling and air are compatible with recent surgery. IMPRESSION: 1. New right hip total arthroplasty in anatomic alignment. Electronically Signed   By: Ronney Asters M.D.   On: 11/18/2021 16:16   DG C-Arm 1-60 Min-No Report  Result Date: 11/18/2021 Fluoroscopy was utilized by the requesting physician.  No radiographic interpretation.   DG HIP OPERATIVE UNILAT W OR W/O PELVIS RIGHT  Result Date: 11/18/2021 CLINICAL DATA:  Fluoroscopic assistance was provided for right hip arthroplasty EXAM: OPERATIVE right HIP (WITH PELVIS IF PERFORMED) AP VIEWS TECHNIQUE: Fluoroscopic spot image(s) were submitted for interpretation post-operatively. COMPARISON:  None FINDINGS: Fluoroscopic images show severe degenerative changes in the early image. There is interval right hip arthroplasty.  Fluoroscopic time was 7 seconds. Radiation dose is 1.18 mGy. IMPRESSION: Fluoroscopic assistance was provided for right hip arthroplasty. Electronically Signed   By:  Elmer Picker M.D.   On: 11/18/2021 16:05    EKG: Orders placed or performed in visit on 10/30/21   EKG 12-Lead     Hospital Course: Andrea Martinez is a 75 y.o. who was admitted to [facility]. They were brought to the operating room on 11/18/2021 and underwent Procedure(s): [place].  Patient tolerated the procedure well and was later transferred to the recovery room and then to the orthopaedic floor for postoperative care. They were given PO and IV analgesics for pain control following their surgery. They were given 24 hours of postoperative antibiotics of  Anti-infectives (From admission, onward)    Start     Dose/Rate Route Frequency Ordered Stop   11/18/21 2000  ceFAZolin (ANCEF) IVPB 2g/100 mL premix        2 g 200 mL/hr over 30 Minutes Intravenous Every 6 hours 11/18/21 1828 11/19/21 0145   11/18/21 1130  ceFAZolin (ANCEF) IVPB 2g/100 mL premix        2 g 200 mL/hr over 30 Minutes Intravenous On call to O.R. 11/18/21 1121 11/18/21 1415      and started on DVT prophylaxis in the form of Aspirin.   PT and OT were ordered for total joint protocol. Discharge planning consulted to help with postop disposition and equipment needs.  Patient had a good night on the evening of surgery. They started to get up OOB with therapy on POD #1. Pt was seen during rounds and was ready to go home pending progress with therapy. She worked with therapy on POD #1 and was meeting her goals. Pt was discharged to home later that day in stable condition.  Diet: Regular diet Activity: WBAT Follow-up: in 2 weeks Disposition: Home with HEP Discharged Condition: stable   Discharge Instructions     Call MD / Call 911   Complete by: As directed    If you experience chest pain or shortness of breath,  CALL 911 and be transported to the hospital emergency room.  If you develope a fever above 101 F, pus (white drainage) or increased drainage or redness at the wound, or calf pain, call your surgeon's office.   Change dressing   Complete by: As directed    You have an adhesive waterproof bandage over the incision. Leave this in place until your first follow-up appointment. Once you remove this you will not need to place another bandage.   Constipation Prevention   Complete by: As directed    Drink plenty of fluids.  Prune juice may be helpful.  You may use a stool softener, such as Colace (over the counter) 100 mg twice a day.  Use MiraLax (over the counter) for constipation as needed.   Diet - low sodium heart healthy   Complete by: As directed    Do not sit on low chairs, stoools or toilet seats, as it may be difficult to get up from low surfaces   Complete by: As directed    Driving restrictions   Complete by: As directed    No driving for two weeks   Post-operative opioid taper instructions:   Complete by: As directed    POST-OPERATIVE OPIOID TAPER INSTRUCTIONS: It is important to wean off of your opioid medication as soon as possible. If you do not need pain medication after your surgery it is ok to stop day one. Opioids include: Codeine, Hydrocodone(Norco, Vicodin), Oxycodone(Percocet, oxycontin) and hydromorphone amongst others.  Long term and even short term  use of opiods can cause: Increased pain response Dependence Constipation Depression Respiratory depression And more.  Withdrawal symptoms can include Flu like symptoms Nausea, vomiting And more Techniques to manage these symptoms Hydrate well Eat regular healthy meals Stay active Use relaxation techniques(deep breathing, meditating, yoga) Do Not substitute Alcohol to help with tapering If you have been on opioids for less than two weeks and do not have pain than it is ok to stop all together.  Plan to wean off of  opioids This plan should start within one week post op of your joint replacement. Maintain the same interval or time between taking each dose and first decrease the dose.  Cut the total daily intake of opioids by one tablet each day Next start to increase the time between doses. The last dose that should be eliminated is the evening dose.      TED hose   Complete by: As directed    Use stockings (TED hose) for three weeks on both leg(s).  You may remove them at night for sleeping.   Weight bearing as tolerated   Complete by: As directed       Allergies as of 11/19/2021       Reactions   Nucynta [tapentadol Hcl] Swelling   Codeine Nausea Only, Other (See Comments)   Headache   Sulfonamide Derivatives Nausea Only, Other (See Comments)   Headache   Esomeprazole Magnesium Nausea Only   Fluconazole Rash   Redness and blistering on left thigh        Medication List     TAKE these medications    acetaminophen 500 MG tablet Commonly known as: TYLENOL Take 1,000 mg by mouth every 8 (eight) hours as needed for moderate pain.   aspirin 325 MG EC tablet Take 1 tablet (325 mg total) by mouth 2 (two) times daily for 20 days. Then take one 81 mg aspirin once a day for three weeks. Then discontinue aspirin.   atorvastatin 10 MG tablet Commonly known as: LIPITOR Take 1 tablet (10 mg total) by mouth daily.   CENTRUM SILVER PO Take 1 tablet by mouth daily in the afternoon.   cetirizine 10 MG tablet Commonly known as: ZYRTEC Take 10 mg by mouth daily.   escitalopram 20 MG tablet Commonly known as: LEXAPRO TAKE 1 AND 1/2 TABLETS DAILY BY MOUTH   HYDROcodone-acetaminophen 5-325 MG tablet Commonly known as: NORCO/VICODIN Take 1-2 tablets by mouth every 6 (six) hours as needed for moderate pain or severe pain.   lactose free nutrition Liqd Take 237 mLs by mouth daily.   levothyroxine 112 MCG tablet Commonly known as: SYNTHROID Take 1 tablet (112 mcg total) by mouth daily  before breakfast.   methocarbamol 500 MG tablet Commonly known as: ROBAXIN Take 1 tablet (500 mg total) by mouth every 6 (six) hours as needed for muscle spasms.   nilotinib 150 MG capsule Commonly known as: TASIGNA Take 2 capsules (300 mg total) by mouth every 12 (twelve) hours.   PROBIOTIC DAILY PO Take 2 tablets by mouth daily in the afternoon.   VITAMIN B-12 PO Take 1 tablet by mouth daily in the afternoon.   Vitamin D3 50 MCG (2000 UT) Tabs Take 2,000 Units by mouth daily in the afternoon.               Discharge Care Instructions  (From admission, onward)           Start     Ordered   11/19/21 0000  Weight bearing as tolerated        11/19/21 0741   11/19/21 0000  Change dressing       Comments: You have an adhesive waterproof bandage over the incision. Leave this in place until your first follow-up appointment. Once you remove this you will not need to place another bandage.   11/19/21 0741            Follow-up Information     Gaynelle Arabian, MD. Go on 12/01/2021.   Specialty: Orthopedic Surgery Why: You are scheduled for a follow up appointment on 12-01-21 at 1:05 pm. Contact information: [address] [place] [address] [phone]                 Signed: Theresa Duty, PA-C Orthopedic Surgery 11/23/2021, 11:16 AM

## 2021-12-20 ENCOUNTER — Other Ambulatory Visit: Payer: Self-pay | Admitting: Family Medicine

## 2021-12-20 DIAGNOSIS — E782 Mixed hyperlipidemia: Secondary | ICD-10-CM

## 2021-12-20 DIAGNOSIS — F419 Anxiety disorder, unspecified: Secondary | ICD-10-CM

## 2021-12-20 DIAGNOSIS — I700 Atherosclerosis of aorta: Secondary | ICD-10-CM

## 2021-12-25 ENCOUNTER — Ambulatory Visit (INDEPENDENT_AMBULATORY_CARE_PROVIDER_SITE_OTHER): Payer: PPO | Admitting: Nurse Practitioner

## 2021-12-25 ENCOUNTER — Encounter: Payer: Self-pay | Admitting: Nurse Practitioner

## 2021-12-25 ENCOUNTER — Encounter (HOSPITAL_COMMUNITY): Payer: Self-pay | Admitting: Adult Health

## 2021-12-25 ENCOUNTER — Encounter: Payer: Medicare Other | Admitting: Family Medicine

## 2021-12-25 DIAGNOSIS — R110 Nausea: Secondary | ICD-10-CM

## 2021-12-25 DIAGNOSIS — A09 Infectious gastroenteritis and colitis, unspecified: Secondary | ICD-10-CM

## 2021-12-25 MED ORDER — CIPROFLOXACIN HCL 500 MG PO TABS: 500.0000 mg | ORAL_TABLET | Freq: Two times a day (BID) | ORAL | 0 refills | Status: DC

## 2021-12-25 MED ORDER — ONDANSETRON HCL 4 MG PO TABS: 4.0000 mg | ORAL_TABLET | Freq: Three times a day (TID) | ORAL | 0 refills | Status: DC | PRN

## 2021-12-25 NOTE — Telephone Encounter (Signed)
Oral Oncology Patient Advocate Encounter  Received notification from [employer]  that patient has been successfully enrolled into their program to receive Tasigna from the manufacturer at $0 out of pocket until 12/12/22.    I called and spoke with patient.  She knows we will have to re-apply.   Specialty Pharmacy that will dispense medication is [facility].  Patient knows to call the office with questions or concerns.   Oral Oncology Clinic will continue to follow.  [place] Patient [place] Phone [phone] Fax [phone] 12/25/2021 1:54 PM

## 2021-12-25 NOTE — Progress Notes (Signed)
Virtual Visit  Note Due to COVID-19 pandemic this visit was conducted virtually. This visit type was conducted due to national recommendations for restrictions regarding the COVID-19 Pandemic (e.g. social distancing, sheltering in place) in an effort to limit this patient's exposure and mitigate transmission in our community. All issues noted in this document were discussed and addressed.  A physical exam was not performed with this format.  I connected with Andrea Martinez on 12/25/21 at 8:46 by telephone and verified that I am speaking with the correct person using two identifiers. Andrea Martinez is currently located at home and no one is currently with her during visit. The provider, [name], FNP is located in their office at time of visit.  I discussed the limitations, risks, security and privacy concerns of performing an evaluation and management service by telephone and the availability of in person appointments. I also discussed with the patient that there may be a patient responsible charge related to this service. The patient expressed understanding and agreed to proceed.   History and Present Illness:  Diarrhea  This is a new problem. The current episode started in the past 7 days. The problem occurs 2 to 4 times per day. Progression since onset: gradualy improved with imodium and now has come back. The patient states that diarrhea does not awaken her from sleep. Associated symptoms include vomiting (heaving). Nothing aggravates the symptoms. There are no known risk factors. Treatments tried: imodium Ad. The treatment provided mild relief.     Review of Systems  Gastrointestinal:  Positive for diarrhea and vomiting (heaving).    Observations/Objective: Alert and oriented- answers all questions appropriately No distress   Assessment and Plan: Andrea Martinez in today with chief complaint of Diarrhea   1. Nausea First 24 Hours-Clear  liquids  popsicles  Jello  gatorade  Sprite Second 24 hours-Add Full liquids ( Liquids you cant see through) Third 24 hours- Bland diet ( foods that are baked or broiled)  *avoiding fried foods and highly spiced foods* During these 3 days  Avoid milk, cheese, ice cream or any other dairy products  Avoid caffeine- REMEMBER Mt. Dew and Mello Yellow contain lots of caffeine You should eat and drink in  Frequent small volumes If no improvement in symptoms or worsen in 2-3 days should RETRUN TO OFFICE or go to ER!    - ondansetron (ZOFRAN) 4 MG tablet; Take 1 tablet (4 mg total) by mouth every 8 (eight) hours as needed for nausea or vomiting.  Dispense: 20 tablet; Refill: 0  2. Diarrhea of infectious origin Continue imodium AD as needed - ciprofloxacin (CIPRO) 500 MG tablet; Take 1 tablet (500 mg total) by mouth 2 (two) times daily.  Dispense: 20 tablet; Refill: 0   Follow Up Instructions: prn    I discussed the assessment and treatment plan with the patient. The patient was provided an opportunity to ask questions and all were answered. The patient agreed with the plan and demonstrated an understanding of the instructions.   The patient was advised to call back or seek an in-person evaluation if the symptoms worsen or if the condition fails to improve as anticipated.  The above assessment and management plan was discussed with the patient. The patient verbalized understanding of and has agreed to the management plan. Patient is aware to call the clinic if symptoms persist or worsen. Patient is aware when to return to the clinic for a follow-up visit. Patient educated on when it is  appropriate to go to the emergency department.   Time call ended:  8:58  I provided 12 minutes of  non face-to-face time during this encounter.    [name], FNP

## 2021-12-25 NOTE — Patient Instructions (Signed)
Diarrhea, Adult °Diarrhea is when you pass loose and watery poop (stool) often. Diarrhea can make you feel weak and cause you to lose water in your body (get dehydrated). Losing water in your body can cause you to: °Feel tired and thirsty. °Have a dry mouth. °Go pee (urinate) less often. °Diarrhea often lasts 2-3 days. However, it can last longer if it is a sign of something more serious. It is important to treat your diarrhea as told by your doctor. °Follow these instructions at home: °Eating and drinking °  °Follow these instructions as told by your doctor: °Take an ORS (oral rehydration solution). This is a drink that helps you replace fluids and minerals your body lost. It is sold at pharmacies and stores. °Drink plenty of fluids, such as: °Water. °Ice chips. °Diluted fruit juice. °Low-calorie sports drinks. °Milk, if you want. °Avoid drinking fluids that have a lot of sugar or caffeine in them. °Eat bland, easy-to-digest foods in small amounts as you are able. These foods include: °Bananas. °Applesauce. °Rice. °Low-fat (lean) meats. °Toast. °Crackers. °Avoid alcohol. °Avoid spicy or fatty foods. ° °Medicines °Take over-the-counter and prescription medicines only as told by your doctor. °If you were prescribed an antibiotic medicine, take it as told by your doctor. Do not stop using the antibiotic even if you start to feel better. °General instructions ° °Wash your hands often using soap and water. If soap and water are not available, use a hand sanitizer. Others in your home should wash their hands as well. Hands should be washed: °After using the toilet or changing a diaper. °Before preparing, cooking, or serving food. °While caring for a sick person. °While visiting someone in a hospital. °Drink enough fluid to keep your pee (urine) pale yellow. °Rest at home while you get better. °Take a warm bath to help with any burning or pain from having diarrhea. °Watch your condition for any changes. °Keep all  follow-up visits as told by your doctor. This is important. °Contact a doctor if: °You have a fever. °Your diarrhea gets worse. °You have new symptoms. °You cannot keep fluids down. °You feel light-headed or dizzy. °You have a headache. °You have muscle cramps. °Get help right away if: °You have chest pain. °You feel very weak or you pass out (faint). °You have bloody or black poop or poop that looks like tar. °You have very bad pain, cramping, or bloating in your belly (abdomen). °You have trouble breathing or you are breathing very quickly. °Your heart is beating very quickly. °Your skin feels cold and clammy. °You feel confused. °You have signs of losing too much water in your body, such as: °Dark pee, very little pee, or no pee. °Cracked lips. °Dry mouth. °Sunken eyes. °Sleepiness. °Weakness. °Summary °Diarrhea is when you pass loose and watery poop (stool) often. °Diarrhea can make you feel weak and cause you to lose water in your body (get dehydrated). °Take an ORS (oral rehydration solution). This is a drink that is sold at pharmacies and stores. °Eat bland, easy-to-digest foods in small amounts as you are able. °Contact a doctor if your condition gets worse. Get help right away if you have signs that you have lost too much water in your body. °This information is not intended to replace advice given to you by your health care provider. Make sure you discuss any questions you have with your health care provider. °Document Revised: 06/10/2021 Document Reviewed: 06/10/2021 °Elsevier Patient Education © 2022 Elsevier Inc. ° °

## 2021-12-25 NOTE — Telephone Encounter (Signed)
Oral Oncology Patient Advocate Encounter  Received communication from [employer] that the patient's eligibility in the patient assistance program for Tasigna was due for re-enrollment.    Completed application signed by patient and provider.  The renewal application has been completed and faxed in an effort to keep the patient's out of pocket expense for Tasigna at $0.     Application completed and faxed to [phone] on 11/20/21.    [employer] Patient [place] phone number for follow up is [phone].    This encounter will be updated until final determination.  [place] Patient [place] Phone [phone] Fax [phone]

## 2021-12-29 ENCOUNTER — Encounter (HOSPITAL_COMMUNITY): Payer: Self-pay | Admitting: Adult Health

## 2022-01-01 DIAGNOSIS — M25551 Pain in right hip: Secondary | ICD-10-CM | POA: Insufficient documentation

## 2022-01-05 ENCOUNTER — Other Ambulatory Visit: Payer: Self-pay

## 2022-01-05 ENCOUNTER — Inpatient Hospital Stay (HOSPITAL_COMMUNITY): Payer: PPO | Attending: Hematology

## 2022-01-05 DIAGNOSIS — D649 Anemia, unspecified: Secondary | ICD-10-CM | POA: Insufficient documentation

## 2022-01-05 DIAGNOSIS — J90 Pleural effusion, not elsewhere classified: Secondary | ICD-10-CM | POA: Insufficient documentation

## 2022-01-05 DIAGNOSIS — R5383 Other fatigue: Secondary | ICD-10-CM | POA: Insufficient documentation

## 2022-01-05 DIAGNOSIS — C9210 Chronic myeloid leukemia, BCR/ABL-positive, not having achieved remission: Secondary | ICD-10-CM | POA: Diagnosis present

## 2022-01-05 DIAGNOSIS — Z96641 Presence of right artificial hip joint: Secondary | ICD-10-CM | POA: Insufficient documentation

## 2022-01-05 DIAGNOSIS — Z801 Family history of malignant neoplasm of trachea, bronchus and lung: Secondary | ICD-10-CM | POA: Diagnosis not present

## 2022-01-05 DIAGNOSIS — L750 Bromhidrosis: Secondary | ICD-10-CM | POA: Insufficient documentation

## 2022-01-05 DIAGNOSIS — I3139 Other pericardial effusion (noninflammatory): Secondary | ICD-10-CM | POA: Insufficient documentation

## 2022-01-05 LAB — COMPREHENSIVE METABOLIC PANEL
ALT: 18 U/L (ref 0–44)
AST: 26 U/L (ref 15–41)
Albumin: 3.8 g/dL (ref 3.5–5.0)
Alkaline Phosphatase: 160 U/L — ABNORMAL HIGH (ref 38–126)
Anion gap: 9 (ref 5–15)
BUN: 27 mg/dL — ABNORMAL HIGH (ref 8–23)
CO2: 25 mmol/L (ref 22–32)
Calcium: 9 mg/dL (ref 8.9–10.3)
Chloride: 104 mmol/L (ref 98–111)
Creatinine, Ser: 1.08 mg/dL — ABNORMAL HIGH (ref 0.44–1.00)
GFR, Estimated: 53 mL/min — ABNORMAL LOW (ref >60)
Glucose, Bld: 112 mg/dL — ABNORMAL HIGH (ref 70–99)
Potassium: 4.5 mmol/L (ref 3.5–5.1)
Sodium: 138 mmol/L (ref 135–145)
Total Bilirubin: 0.4 mg/dL (ref 0.3–1.2)
Total Protein: 7.6 g/dL (ref 6.5–8.1)

## 2022-01-05 LAB — CBC WITH DIFFERENTIAL/PLATELET
Abs Immature Granulocytes: 0.09 10*3/uL — ABNORMAL HIGH (ref 0.00–0.07)
Basophils Absolute: 0 10*3/uL (ref 0.0–0.1)
Basophils Relative: 0 %
Eosinophils Absolute: 0.2 10*3/uL (ref 0.0–0.5)
Eosinophils Relative: 2 %
HCT: 35.9 % — ABNORMAL LOW (ref 36.0–46.0)
Hemoglobin: 10.7 g/dL — ABNORMAL LOW (ref 12.0–15.0)
Immature Granulocytes: 1 %
Lymphocytes Relative: 11 %
Lymphs Abs: 1.4 10*3/uL (ref 0.7–4.0)
MCH: 26.4 pg (ref 26.0–34.0)
MCHC: 29.8 g/dL — ABNORMAL LOW (ref 30.0–36.0)
MCV: 88.4 fL (ref 80.0–100.0)
Monocytes Absolute: 0.9 10*3/uL (ref 0.1–1.0)
Monocytes Relative: 7 %
Neutro Abs: 10 10*3/uL — ABNORMAL HIGH (ref 1.7–7.7)
Neutrophils Relative %: 79 %
Platelets: 329 10*3/uL (ref 150–400)
RBC: 4.06 MIL/uL (ref 3.87–5.11)
RDW: 13 % (ref 11.5–15.5)
WBC: 12.6 10*3/uL — ABNORMAL HIGH (ref 4.0–10.5)
nRBC: 0 % (ref 0.0–0.2)

## 2022-01-12 ENCOUNTER — Encounter (HOSPITAL_COMMUNITY): Payer: Self-pay | Admitting: Adult Health

## 2022-01-12 ENCOUNTER — Inpatient Hospital Stay (HOSPITAL_BASED_OUTPATIENT_CLINIC_OR_DEPARTMENT_OTHER): Payer: PPO | Admitting: Hematology

## 2022-01-12 ENCOUNTER — Other Ambulatory Visit (HOSPITAL_COMMUNITY): Payer: Self-pay

## 2022-01-12 ENCOUNTER — Other Ambulatory Visit: Payer: Self-pay

## 2022-01-12 VITALS — BP 130/66 | HR 84 | Temp 99.0°F | Resp 18 | Ht 65.0 in | Wt 203.9 lb

## 2022-01-12 DIAGNOSIS — C9210 Chronic myeloid leukemia, BCR/ABL-positive, not having achieved remission: Secondary | ICD-10-CM | POA: Diagnosis not present

## 2022-01-12 LAB — BCR-ABL1, CML/ALL, PCR, QUANT: Interpretation (BCRAL):: NEGATIVE

## 2022-01-12 NOTE — Progress Notes (Signed)
[address], [place] [address]   CLINIC:  Medical Oncology/Hematology  PCP:  Loman [place], [place] / [place] [address]  [phone]  REASON FOR VISIT:  Follow-up for CML  PRIOR THERAPY: none  CURRENT THERAPY: Tasigna 300 mg Q 12 H  INTERVAL HISTORY:  [name], a 76 y.o. female, returns for routine follow-up for her CML. Andrea Martinez was last seen on 12/31/2020.  Today she reports feeling well. She reports fatigue and depression. She had a right hip replacement in December 2022 from which she has numbness in her right leg. She is taking TASIGNA and tolerating it well. She reports Lexapro has not been helping with her anxiety.   REVIEW OF SYSTEMS:  Review of Systems  Constitutional:  Positive for fatigue. Negative for appetite change.  Gastrointestinal:  Positive for constipation, diarrhea and nausea.  Musculoskeletal:  Positive for arthralgias (5/10 R hip).  Psychiatric/Behavioral:  Positive for depression and sleep disturbance. The patient is nervous/anxious.   All other systems reviewed and are negative.  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 Elyse Hsu   Lipoma    left upper arm   Mixed hyperlipidemia    Osteoporosis    Pericardial effusion    a. HCAP complicated by pericardial effusion requiring pericardial window 10/2016 and large L pleural effusion requring VATS.   Pleural effusion    a. s/p VATS 2017.   Pneumonia 10/2016   PONV (postoperative nausea and vomiting)    history of   Prolonged QT interval    Urine incontinence    UTI (lower urinary tract infection)    frequent   Vitamin D deficiency    Past Surgical History:  Procedure Laterality  Date   BRAVO [place] STUDY  12/04/2012   Procedure: BRAVO [place];  Surgeon: [name], MD;  [facility];  Service: Endoscopy;  Laterality: N/A;   CHOLECYSTECTOMY N/A 01/03/2014   Procedure: LAPAROSCOPIC CHOLECYSTECTOMY WITH INTRAOPERATIVE CHOLANGIOGRAM;  Surgeon: Joyice Faster. Cornett, MD;  Location: [facility];  Service: General;  Laterality: N/A;   COLONOSCOPY  01/2004   [facility], few small tics   ESOPHAGOGASTRODUODENOSCOPY  02/2010   gastric ulcers   ESOPHAGOGASTRODUODENOSCOPY  12/04/2012   [id number] gastritis (inflammation) was found in the gastric antrum; multiple biopsies The duodenal mucosa showed no abnormalities in the bulb and second portion of the duodenum   JOINT REPLACEMENT Right 10/22/2019   KYPHOPLASTY N/A 02/14/2019   Procedure: KYPHOPLASTY T12 and L1;  Surgeon: Melina Schools, MD;  Location: [facility];  Service: Orthopedics;  Laterality: N/A;  120 mins   LIPOMA EXCISION  08/02/2011   left shoulder   NOSE SURGERY     PARTIAL HYSTERECTOMY     vaginal at age 69 years of age   TOE 29 Left 1962   lt great toe   TOTAL HIP ARTHROPLASTY Right 11/18/2021   Procedure: TOTAL HIP ARTHROPLASTY ANTERIOR APPROACH;  Surgeon: Gaynelle Arabian, MD;  [facility];  Service: Orthopedics;  Laterality: Right;   TOTAL KNEE ARTHROPLASTY Right 10/22/2019   Procedure: TOTAL KNEE ARTHROPLASTY;  Surgeon: Gaynelle Arabian, MD;  [facility];  Service: Orthopedics;  Laterality: Right;  66mn   TOTAL KNEE ARTHROPLASTY Left 03/03/2020   Procedure: TOTAL KNEE ARTHROPLASTY;  Surgeon: AGaynelle Arabian MD;  [facility];  Service: Orthopedics;  Laterality: Left;  570m   TRANSFORAMINAL LUMBAR INTERBODY FUSION (TLIF) WITH PEDICLE SCREW FIXATION 1 LEVEL N/A 01/28/2021   Procedure: TRANSFORAMINAL LUMBAR INTERBODY FUSION (TLIF) LUMBAR FIVE-SACRAL ONE;  Surgeon: BrMelina SchoolsMD;  [place] Service: Orthopedics;  Laterality: N/A;  4 hrs   VIDEO ASSISTED THORACOSCOPY  (VATS)/EMPYEMA Left 10/25/2016   Procedure: VIDEO ASSISTED THORACOSCOPY (VATS), BRONCH,DRAINAGE OF PLEURAL EFFUSION,PERICARDIAL WINDOW WITH DRAINAGE OF PERICARDIAL FLUID, TEE;  Surgeon: StMelrose NakayamaMD;  [facility] Service: Thoracic;  Laterality: Left;   VIDEO BRONCHOSCOPY N/A 10/25/2016   Procedure: VIDEO BRONCHOSCOPY;  Surgeon: StMelrose NakayamaMD;  [facility] Service: Thoracic;  Laterality: N/A;    SOCIAL HISTORY:  Social History   Socioeconomic History   Marital status: Married    Spouse name: Not on file   Number of children: 2   Years of education: 12th grade   Highest education level: Not on file  Occupational History   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  Employer: RETIRED  Tobacco Use   Smoking status: Never   Smokeless tobacco: Never  Vaping Use   Vaping Use: Never used  Substance and Sexual Activity   Alcohol use: No    Alcohol/week: 0.0 standard drinks   Drug use: No   Sexual activity: Yes    Partners: Male    Birth control/protection: None  Other Topics Concern   Not on file  Social History Narrative   Not on file   Social Determinants of Health   Financial Resource Strain: Low Risk    Difficulty of Paying Living Expenses: Not hard at all  Food Insecurity: No Food Insecurity   Worried About RuCharity fundraisern the Last Year: Never true   RaConneautn the Last Year: Never true  Transportation Needs: No Transportation Needs   Lack of Transportation (Medical): No   Lack of Transportation (Non-Medical): No  Physical Activity: Sufficiently Active   Days of Exercise per Week: 7 days   Minutes of Exercise per Session: 30 min  Stress: No Stress Concern Present   Feeling of Stress : Only a little  Social Connections: SoEngineer, building servicesf Communication with Friends and Family: More than three times a week   Frequency of Social Gatherings with Friends and Family: More than three times a week   Attends  Religious Services: More than 4 times per year   Active Member of [employer] Organizations: Yes   Attends ClArchivisteetings: 1 to 4 times per year   Marital Status: Married  InHuman resources officeriolence: Not on file    FAMILY HISTORY:  Family History  Problem Relation Age of Onset   Lung cancer Father    COPD Mother    Stroke Mother    Thyroid disease Mother    Colon cancer Neg Hx    Anesthesia problems Neg Hx    Hypotension Neg Hx    Malignant hyperthermia Neg Hx    Pseudochol deficiency Neg Hx     CURRENT MEDICATIONS:  Current Outpatient Medications  Medication Sig Dispense Refill   acetaminophen (TYLENOL) 500 MG tablet Take 1,000 mg by mouth every 8 (eight) hours as needed for moderate pain.     cetirizine (ZYRTEC)  10 MG tablet Take 10 mg by mouth daily.     Cholecalciferol (VITAMIN D3) 50 MCG (2000 UT) TABS Take 2,000 Units by mouth daily in the afternoon.     escitalopram (LEXAPRO) 20 MG tablet TAKE 1 AND 1/2 TABLETS BY MOUTH DAILY 135 tablet 0   lactose free nutrition (BOOST) LIQD Take 237 mLs by mouth daily.     levothyroxine (SYNTHROID) 112 MCG tablet Take 1 tablet (112 mcg total) by mouth daily before breakfast. 90 tablet 3   Multiple Vitamins-Minerals (CENTRUM SILVER PO) Take 1 tablet by mouth daily in the afternoon.     nilotinib (TASIGNA) 150 MG capsule Take 2 capsules (300 mg total) by mouth every 12 (twelve) hours. 120 capsule 6   Probiotic Product (PROBIOTIC DAILY PO) Take 2 tablets by mouth daily in the afternoon.     No current facility-administered medications for this visit.    ALLERGIES:  Allergies  Allergen Reactions   Nucynta [Tapentadol Hcl] Swelling   Codeine Nausea Only and Other (See Comments)    Headache   Sulfonamide Derivatives Nausea Only and Other (See Comments)    Headache   Esomeprazole Magnesium Nausea Only   Fluconazole Rash    Redness and blistering on left thigh    PHYSICAL EXAM:  Performance status (ECOG): 2 -  Symptomatic, <50% confined to bed  Vitals:   01/12/22 1357  BP: 130/66  Pulse: 84  Resp: 18  Temp: 99 F (37.2 C)  SpO2: 99%   Wt Readings from Last 3 Encounters:  01/12/22 203 lb 14.8 oz (92.5 kg)  11/18/21 202 lb 6.1 oz (91.8 kg)  11/11/21 202 lb 8 oz (91.9 kg)   Physical Exam Vitals reviewed.  Constitutional:      Appearance: Normal appearance.  Cardiovascular:     Rate and Rhythm: Normal rate and regular rhythm.     Pulses: Normal pulses.     Heart sounds: Normal heart sounds.  Pulmonary:     Effort: Pulmonary effort is normal.     Breath sounds: Normal breath sounds.  Musculoskeletal:     Right lower leg: No edema.     Left lower leg: No edema.  Neurological:     General: No focal deficit present.     Mental Status: She is alert and oriented to person, place, and time.  Psychiatric:        Mood and Affect: Mood normal.        Behavior: Behavior normal.    LABORATORY DATA:  I have reviewed the labs as listed.  CBC Latest Ref Rng & Units 01/05/2022 11/19/2021 11/18/2021  WBC 4.0 - 10.5 K/uL 12.6(H) 13.1(H) 11.0(H)  Hemoglobin 12.0 - 15.0 g/dL 10.7(L) 9.8(L) 12.5  Hematocrit 36.0 - 46.0 % 35.9(L) 31.9(L) 41.3  Platelets 150 - 400 K/uL 329 186 221   CMP Latest Ref Rng & Units 01/05/2022 11/19/2021 11/19/2021  Glucose 70 - 99 mg/dL 112(H) 168(H) 159(H)  BUN 8 - 23 mg/dL 27(H) 26(H) 26(H)  Creatinine 0.44 - 1.00 mg/dL 1.08(H) 0.92 0.98  Sodium 135 - 145 mmol/L 138 137 137  Potassium 3.5 - 5.1 mmol/L 4.5 5.2(H) 5.4(H)  Chloride 98 - 111 mmol/L 104 106 107  CO2 22 - 32 mmol/L _0 Calcium 8.9 - 10.3 mg/dL 9.0 8.4(L) 8.4(L)  Total Protein 6.5 - 8.1 g/dL 7.6 - -  Total Bilirubin 0.3 - 1.2 mg/dL 0.4 - -  Alkaline Phos 38 - 126 U/L 160(H) - -  AST 15 -  41 U/L 26 - -  ALT 0 - 44 U/L 18 - -      Component Value Date/Time   RBC 4.06 01/05/2022 1056   MCV 88.4 01/05/2022 1056   MCV 84 10/30/2021 1525   MCH 26.4 01/05/2022 1056   MCHC 29.8 (L) 01/05/2022 1056    RDW 13.0 01/05/2022 1056   RDW 12.6 10/30/2021 1525   LYMPHSABS 1.4 01/05/2022 1056   LYMPHSABS 1.7 10/30/2021 1525   MONOABS 0.9 01/05/2022 1056   EOSABS 0.2 01/05/2022 1056   EOSABS 0.2 10/30/2021 1525   BASOSABS 0.0 01/05/2022 1056   BASOSABS 0.0 10/30/2021 1525    DIAGNOSTIC IMAGING:  I have independently reviewed the scans and discussed with the patient. No results found.   ASSESSMENT:  1.  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 -Left pleural effusion and pericardial effusion requiring pericardial window on 10/25/2016.  Fluid cytology negative. -Started back on Nilotinib 300 mg every 12 hours on 12/06/2016.   2.  Daytime sweats: -He had a long history of daytime sweats.  She had extensive work-up including a CT scan CAP, which showed no evidence of malignancy.  Hepatic morphology suspicious for cirrhosis.   PLAN:  1.  CML in chronic phase: - She is tolerating Nilotinib at 300 mg twice daily very well. - Reviewed labs from 01/05/2022 which showed white count 12.6 with predominantly neutrophils.  Platelet count was normal.  LFTs were normal except elevated alk phos.  Creatinine was also mildly elevated at 1.08. - I have reviewed the BCR/ABL by quantitative PCR from 01/05/2022-undetectable. - She continues to be in complete molecular response.  Continue Nilotinib 300 mg twice daily. - RTC 6 months for follow-up with repeat BCR/ABL by PCR.   2.  Fatigue: - She recently had right hip replaced on 11/18/2021.  She also had both knees replaced. - She reports that back-to-back surgeries have been causing her to feel more fatigued.  I have encouraged physical activity.   3.  Normocytic anemia: - She does not report any bleeding per rectum or melena. - Hemoglobin is 10.7.  Likely from combination of recent surgery, myelosuppression from Nilotinib. - We will repeat labs in 6 months.  Orders placed this encounter:  No orders of the defined types were placed in this  encounter.    Derek Jack, MD [place] [phone]   I, Thana Ates, am acting as a scribe for Dr. Derek Jack.  I, Derek Jack MD, have reviewed the above documentation for accuracy and completeness, and I agree with the above.

## 2022-01-12 NOTE — Patient Instructions (Signed)
[place] at [facility] Discharge Instructions  You were seen and examined today by Dr. Delton Coombes. He reviewed your most recent labs and it is showing some mild anemia. Your leukemia numbers look good and all other labs look okay except your kidney function test is slightly elevated. The anemia and kidney functions elevated could be coming from your most recent surgery. Please keep follow up appointment as scheduled in 6 months.   Thank you for choosing [place] at [facility] to provide your oncology and hematology care.  To afford each patient quality time with our provider, please arrive at least 15 minutes before your scheduled appointment time.   If you have a lab appointment with the [facility] please come in thru the Main Entrance and check in at the main information desk.  You need to re-schedule your appointment should you arrive 10 or more minutes late.  We strive to give you quality time with our providers, and arriving late affects you and other patients whose appointments are after yours.  Also, if you no show three or more times for appointments you may be dismissed from the clinic at the providers discretion.     Again, thank you for choosing [facility].  Our hope is that these requests will decrease the amount of time that you wait before being seen by our physicians.       _____________________________________________________________  Should you have questions after your visit to [facility], please contact our office at [phone] and follow the prompts.  Our office hours are 8:00 a.m. and 4:30 p.m. Monday - Friday.  Please note that voicemails left after 4:00 p.m. may not be returned until the following business day.  We are closed weekends and major holidays.  You do have access to a nurse 24-7, just call the main number to the clinic [phone] and do not press any options, hold on the line and a  nurse will answer the phone.    For prescription refill requests, have your pharmacy contact our office and allow 72 hours.    Due to Covid, you will need to wear a mask upon entering the hospital. If you do not have a mask, a mask will be given to you at the Main Entrance upon arrival. For doctor visits, patients may have 1 support person age 80 or older with them. For treatment visits, patients can not have anyone with them due to social distancing guidelines and our immunocompromised population.

## 2022-01-26 ENCOUNTER — Ambulatory Visit (INDEPENDENT_AMBULATORY_CARE_PROVIDER_SITE_OTHER): Payer: PPO | Admitting: Nurse Practitioner

## 2022-01-26 DIAGNOSIS — J0100 Acute maxillary sinusitis, unspecified: Secondary | ICD-10-CM

## 2022-01-26 MED ORDER — AMOXICILLIN-POT CLAVULANATE 875-125 MG PO TABS: 1.0000 | ORAL_TABLET | Freq: Two times a day (BID) | ORAL | 0 refills | Status: DC

## 2022-01-26 NOTE — Progress Notes (Signed)
Virtual Visit  Note Due to COVID-19 pandemic this visit was conducted virtually. This visit type was conducted due to national recommendations for restrictions regarding the COVID-19 Pandemic (e.g. social distancing, sheltering in place) in an effort to limit this patient's exposure and mitigate transmission in our community. All issues noted in this document were discussed and addressed.  A physical exam was not performed with this format.  I connected with Andrea Martinez on 01/26/22 at 1:45 by telephone and verified that I am speaking with the correct person using two identifiers. Andrea Martinez is currently located at home and no one is currently with her during visit. The provider, Barba-Margaret Hassell Done, FNP is located in their office at time of visit.  I discussed the limitations, risks, security and privacy concerns of performing an evaluation and management service by telephone and the availability of in person appointments. I also discussed with the patient that there may be a patient responsible charge related to this service. The patient expressed understanding and agreed to proceed.   History and Present Illness:  Sinusitis This is a new problem. Episode onset: friday. The problem has been gradually worsening since onset. There has been no fever. Her pain is at a severity of 8/10. The pain is severe. Associated symptoms include congestion, coughing and sinus pressure. Pertinent negatives include no sore throat. (teeth even hurt) Past treatments include acetaminophen. The treatment provided mild relief.  Covid negative    Review of Systems  HENT:  Positive for congestion and sinus pressure. Negative for sore throat.   Respiratory:  Positive for cough.     Observations/Objective: Alert and oriented- answers all questions appropriately No distress Raspy voice No cough Facial pressure all over- described by patient  Assessment and Plan: Andrea Martinez in today with chief complaint  of Sinusitis   1. Acute non-recurrent maxillary sinusitis 1. Take meds as prescribed 2. Use a cool mist humidifier especially during the winter months and when heat has been humid. 3. Use saline nose sprays frequently 4. Saline irrigations of the nose can be very helpful if done frequently.  * 4X daily for 1 week*  * Use of a nettie pot can be helpful with this. Follow directions with this* 5. Drink plenty of fluids 6. Keep thermostat turn down low 7.For any cough or congestion- mucinex D- only as needed 8. For fever or aces or pains- take tylenol or ibuprofen appropriate for age and weight.  * for fevers greater than 101 orally you may alternate ibuprofen and tylenol every  3 hours.    Meds ordered this encounter  Medications   amoxicillin-clavulanate (AUGMENTIN) 875-125 MG tablet    Sig: Take 1 tablet by mouth 2 (two) times daily.    Dispense:  14 tablet    Refill:  0    Order Specific Question:   Supervising Provider    Answer:   Caryl Pina A [[id number]]       Follow Up Instructions: prn    I discussed the assessment and treatment plan with the patient. The patient was provided an opportunity to ask questions and all were answered. The patient agreed with the plan and demonstrated an understanding of the instructions.   The patient was advised to call back or seek an in-person evaluation if the symptoms worsen or if the condition fails to improve as anticipated.  The above assessment and management plan was discussed with the patient. The patient verbalized understanding of and has agreed to the  management plan. Patient is aware to call the clinic if symptoms persist or worsen. Patient is aware when to return to the clinic for a follow-up visit. Patient educated on when it is appropriate to go to the emergency department.   Time call ended: 1:57  I provided 12 minutes of  non face-to-face time during this encounter.    Elle-Margaret Hassell Done, FNP

## 2022-01-26 NOTE — Patient Instructions (Signed)
1. Take meds as prescribed 2. Use a cool mist humidifier especially during the winter months and when heat has been humid. 3. Use saline nose sprays frequently 4. Saline irrigations of the nose can be very helpful if done frequently.  * 4X daily for 1 week*  * Use of a nettie pot can be helpful with this. Follow directions with this* 5. Drink plenty of fluids 6. Keep thermostat turn down low 7.For any cough or congestion- mucinex D only as needed 8. For fever or aces or pains- take tylenol or ibuprofen appropriate for age and weight.  * for fevers greater than 101 orally you may alternate ibuprofen and tylenol every  3 hours.

## 2022-02-05 ENCOUNTER — Encounter: Payer: Self-pay | Admitting: Family Medicine

## 2022-02-05 ENCOUNTER — Ambulatory Visit (INDEPENDENT_AMBULATORY_CARE_PROVIDER_SITE_OTHER): Payer: PPO | Admitting: Family Medicine

## 2022-02-05 DIAGNOSIS — J0140 Acute pansinusitis, unspecified: Secondary | ICD-10-CM | POA: Diagnosis not present

## 2022-02-05 DIAGNOSIS — T3695XA Adverse effect of unspecified systemic antibiotic, initial encounter: Secondary | ICD-10-CM

## 2022-02-05 DIAGNOSIS — K521 Toxic gastroenteritis and colitis: Secondary | ICD-10-CM | POA: Diagnosis not present

## 2022-02-05 MED ORDER — PREDNISONE 20 MG PO TABS: 40.0000 mg | ORAL_TABLET | Freq: Every day | ORAL | 0 refills | Status: AC

## 2022-02-05 NOTE — Progress Notes (Signed)
Virtual Visit via Telephone Note  I connected with Andrea Martinez on 02/05/22 at 12:12 PM by telephone and verified that I am speaking with the correct person using two identifiers. Andrea Martinez is currently located at home and her husband is currently with her during this visit. The provider, Loman Brooklyn, FNP is located in their office at time of visit.  I discussed the limitations, risks, security and privacy concerns of performing an evaluation and management service by telephone and the availability of in person appointments. I also discussed with the patient that there may be a patient responsible charge related to this service. The patient expressed understanding and agreed to proceed.  Subjective: PCP: Loman Brooklyn, FNP  Chief Complaint  Patient presents with   URI   Diarrhea   Patient complains of headache, ear pain/pressure, facial pain/pressure, nausea, and chills . Onset of symptoms was 2 weeks ago, unchanged since that time. She is drinking plenty of fluids. Evaluation to date: previously seen and treated with Augmentin for a sinus infection. Treatment to date: antibiotics and Tylenol . She does not smoke. She reports she has started having diarrhea due to the antibiotic. She has taken Imodium-AD as needed.   ROS: Per HPI  Current Outpatient Medications:    acetaminophen (TYLENOL) 500 MG tablet, Take 1,000 mg by mouth every 8 (eight) hours as needed for moderate pain., Disp: , Rfl:    amoxicillin-clavulanate (AUGMENTIN) 875-125 MG tablet, Take 1 tablet by mouth 2 (two) times daily., Disp: 14 tablet, Rfl: 0   cetirizine (ZYRTEC) 10 MG tablet, Take 10 mg by mouth daily., Disp: , Rfl:    Cholecalciferol (VITAMIN D3) 50 MCG (2000 UT) TABS, Take 2,000 Units by mouth daily in the afternoon., Disp: , Rfl:    escitalopram (LEXAPRO) 20 MG tablet, TAKE 1 AND 1/2 TABLETS BY MOUTH DAILY, Disp: 135 tablet, Rfl: 0   lactose free nutrition (BOOST) LIQD, Take 237 mLs by mouth daily.,  Disp: , Rfl:    levothyroxine (SYNTHROID) 112 MCG tablet, Take 1 tablet (112 mcg total) by mouth daily before breakfast., Disp: 90 tablet, Rfl: 3   Multiple Vitamins-Minerals (CENTRUM SILVER PO), Take 1 tablet by mouth daily in the afternoon., Disp: , Rfl:    nilotinib (TASIGNA) 150 MG capsule, Take 2 capsules (300 mg total) by mouth every 12 (twelve) hours., Disp: 120 capsule, Rfl: 6   Probiotic Product (PROBIOTIC DAILY PO), Take 2 tablets by mouth daily in the afternoon., Disp: , Rfl:   Allergies  Allergen Reactions   Nucynta [Tapentadol Hcl] Swelling   Codeine Nausea Only and Other (See Comments)    Headache   Sulfonamide Derivatives Nausea Only and Other (See Comments)    Headache   Esomeprazole Magnesium Nausea Only   Fluconazole Rash    Redness and blistering on left thigh   Past Medical History:  Diagnosis Date   Anxiety    denies   Arthritis    Chronic myelogenous leukemia (CML), 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 Elyse Hsu   Lipoma    left upper arm   Mixed hyperlipidemia    Osteoporosis    Pericardial effusion    a. HCAP complicated by pericardial effusion requiring pericardial window 10/2016 and large L pleural effusion requring VATS.   Pleural effusion    a. s/p VATS 2017.  Pneumonia 10/2016   PONV (postoperative nausea and vomiting)    history of   Prolonged QT interval    Urine incontinence    UTI (lower urinary tract infection)    frequent   Vitamin D deficiency     Observations/Objective: A&O  No respiratory distress or wheezing audible over the phone Mood, judgement, and thought processes all WNL  Assessment and Plan: 1. Acute non-recurrent pansinusitis Already treated with antibiotics.  - predniSONE (DELTASONE) 20 MG tablet; Take 2 tablets (40 mg total) by mouth daily with breakfast for 5 days.  Dispense: 10 tablet; Refill:  0  2. Antibiotic-associated diarrhea Encouraged probiotic and/or yogurt.     Follow Up Instructions:  I discussed the assessment and treatment plan with the patient. The patient was provided an opportunity to ask questions and all were answered. The patient agreed with the plan and demonstrated an understanding of the instructions.   The patient was advised to call back or seek an in-person evaluation if the symptoms worsen or if the condition fails to improve as anticipated.  The above assessment and management plan was discussed with the patient. The patient verbalized understanding of and has agreed to the management plan. Patient is aware to call the clinic if symptoms persist or worsen. Patient is aware when to return to the clinic for a follow-up visit. Patient educated on when it is appropriate to go to the emergency department.   Time call ended: 12:23 PM  I provided 11 minutes of non-face-to-face time during this encounter.  Hendricks Limes, MSN, APRN, FNP-C [facility] 02/05/22

## 2022-02-11 ENCOUNTER — Ambulatory Visit (INDEPENDENT_AMBULATORY_CARE_PROVIDER_SITE_OTHER): Payer: PPO | Admitting: Family Medicine

## 2022-02-11 ENCOUNTER — Encounter: Payer: Self-pay | Admitting: Family Medicine

## 2022-02-11 VITALS — BP 110/65 | HR 73 | Temp 95.7°F | Ht 65.0 in | Wt 203.6 lb

## 2022-02-11 DIAGNOSIS — R300 Dysuria: Secondary | ICD-10-CM | POA: Diagnosis not present

## 2022-02-11 DIAGNOSIS — F419 Anxiety disorder, unspecified: Secondary | ICD-10-CM | POA: Diagnosis not present

## 2022-02-11 DIAGNOSIS — K580 Irritable bowel syndrome with diarrhea: Secondary | ICD-10-CM | POA: Diagnosis not present

## 2022-02-11 DIAGNOSIS — R1310 Dysphagia, unspecified: Secondary | ICD-10-CM | POA: Diagnosis not present

## 2022-02-11 LAB — URINALYSIS, ROUTINE W REFLEX MICROSCOPIC
Bilirubin, UA: NEGATIVE
Glucose, UA: NEGATIVE
Ketones, UA: NEGATIVE
Nitrite, UA: NEGATIVE
Protein,UA: NEGATIVE
RBC, UA: NEGATIVE
Specific Gravity, UA: 1.015 (ref 1.005–1.030)
Urobilinogen, Ur: 0.2 mg/dL (ref 0.2–1.0)
pH, UA: 7 (ref 5.0–7.5)

## 2022-02-11 LAB — MICROSCOPIC EXAMINATION
RBC, Urine: NONE SEEN /hpf (ref 0–2)
Renal Epithel, UA: NONE SEEN /hpf

## 2022-02-11 MED ORDER — SUCRALFATE 1 GM/10ML PO SUSP: 1.0000 g | Freq: Three times a day (TID) | ORAL | 0 refills | Status: DC

## 2022-02-11 MED ORDER — DICYCLOMINE HCL 10 MG PO CAPS: 10.0000 mg | ORAL_CAPSULE | Freq: Three times a day (TID) | ORAL | 2 refills | Status: DC

## 2022-02-11 NOTE — Progress Notes (Signed)
? ?Assessment & Plan:  ?1. Pain or burning when swallowing ?- sucralfate (CARAFATE) 1 GM/10ML suspension; Take 10 mLs (1 g total) by mouth 4 (four) times daily -  with meals and at bedtime.  Dispense: 414 mL; Refill: 0 ? ?2. Irritable bowel syndrome with diarrhea ?- dicyclomine (BENTYL) 10 MG capsule; Take 1 capsule (10 mg total) by mouth 4 (four) times daily -  before meals and at bedtime.  Dispense: 120 capsule; Refill: 2 ? ?3. Dysuria ?- Urinalysis, Routine w reflex microscopic - Urine dipstick shows positive for leukocytes.  Micro exam: 0-5 WBC's per HPF, 0 RBC's per HPF, and few bacteria. ? ?4. Anxiety ?Patient is not sure she wants to change her anxiety medication. We are going to wait until she is feeling a little better and reassess. ? ? ?Follow up plan: Return in about 4 weeks (around 03/11/2022) for annual physical with anxiety. ? ?Hendricks Limes, MSN, APRN, FNP-C ?[facility] ? ?Subjective:  ? ?Patient ID: Andrea Martinez, female    DOB: April 03, 1946, 76 y.o.   MRN: [id number] ? ?HPI: ?Andrea Martinez is a 76 y.o. female presenting on 02/11/2022 for Chest Pain (Per pt having some chest pain for about a week per pt believes its her medicine, she feels worn out, UTI) ? ?Patient reports a burning pain that starts in her epigastric region and goes down her stomach x1 week. She also gets nauseated when this happens. She had a stomach bug a month and a half ago at which time she was treated with Cipro x10 days. That last for two weeks. She was then treated with Augmentin x7 days a little over two weeks ago. She is currently having diarrhea that started back two weeks ago. She denies dysuria to me in the room and states she just wanted her urine checked to make sure she doesn't have a UTI contributing to her stomach issues.  ? ?Anxiety: patient is concerned her anxiety is not controlled. She is upset by everything that is physically wrong with her.  ? ?GAD 7 : Generalized Anxiety Score 02/11/2022  10/30/2021 09/24/2021 05/12/2021  ?Nervous, Anxious, on Edge 1 0 0 1  ?Control/stop worrying 1 0 1 0  ?Worry too much - different things 1 0 0 0  ?Trouble relaxing 0 0 0 1  ?Restless 0 0 0 0  ?Easily annoyed or irritable 0 0 0 1  ?Afraid - awful might happen 0 0 0 1  ?Total GAD 7 Score 3 0 1 4  ?Anxiety Difficulty Not difficult at all Not difficult at all Not difficult at all Not difficult at all  ? ? ?ROS: Negative unless specifically indicated above in HPI.  ? ?Relevant past medical history reviewed and updated as indicated.  ? ?Allergies and medications reviewed and updated. ? ? ?Current Outpatient Medications:  ?  acetaminophen (TYLENOL) 500 MG tablet, Take 1,000 mg by mouth every 8 (eight) hours as needed for moderate pain., Disp: , Rfl:  ?  amoxicillin-clavulanate (AUGMENTIN) 875-125 MG tablet, Take 1 tablet by mouth 2 (two) times daily., Disp: 14 tablet, Rfl: 0 ?  cetirizine (ZYRTEC) 10 MG tablet, Take 10 mg by mouth daily., Disp: , Rfl:  ?  Cholecalciferol (VITAMIN D3) 50 MCG (2000 UT) TABS, Take 2,000 Units by mouth daily in the afternoon., Disp: , Rfl:  ?  escitalopram (LEXAPRO) 20 MG tablet, TAKE 1 AND 1/2 TABLETS BY MOUTH DAILY, Disp: 135 tablet, Rfl: 0 ?  lactose free nutrition (BOOST) LIQD,  Take 237 mLs by mouth daily., Disp: , Rfl:  ?  levothyroxine (SYNTHROID) 112 MCG tablet, Take 1 tablet (112 mcg total) by mouth daily before breakfast., Disp: 90 tablet, Rfl: 3 ?  Multiple Vitamins-Minerals (CENTRUM SILVER PO), Take 1 tablet by mouth daily in the afternoon., Disp: , Rfl:  ?  nilotinib (TASIGNA) 150 MG capsule, Take 2 capsules (300 mg total) by mouth every 12 (twelve) hours., Disp: 120 capsule, Rfl: 6 ?  Probiotic Product (PROBIOTIC DAILY PO), Take 2 tablets by mouth daily in the afternoon., Disp: , Rfl:  ? ?Allergies  ?Allergen Reactions  ? Nucynta [Tapentadol Hcl] Swelling  ? Codeine Nausea Only and Other (See Comments)  ?  Headache  ? Sulfonamide Derivatives Nausea Only and Other (See Comments)  ?   Headache  ? Esomeprazole Magnesium Nausea Only  ? Fluconazole Rash  ?  Redness and blistering on left thigh  ? ? ?Objective:  ? ?BP 110/65   Pulse 73   Temp (!) 95.7 ?F (35.4 ?C) (Temporal)   Ht 5\' 5"  (1.651 m)   Wt 203 lb 9.6 oz (92.4 kg)   SpO2 97%   BMI 33.88 kg/m?   ? ?Physical Exam ?Vitals reviewed.  ?Constitutional:   ?   General: She is not in acute distress. ?   Appearance: Normal appearance. She is not ill-appearing, toxic-appearing or diaphoretic.  ?HENT:  ?   Head: Normocephalic and atraumatic.  ?Eyes:  ?   General: No scleral icterus.    ?   Right eye: No discharge.     ?   Left eye: No discharge.  ?   Conjunctiva/sclera: Conjunctivae normal.  ?Cardiovascular:  ?   Rate and Rhythm: Normal rate.  ?Pulmonary:  ?   Effort: Pulmonary effort is normal. No respiratory distress.  ?Musculoskeletal:     ?   General: Normal range of motion.  ?   Cervical back: Normal range of motion.  ?Skin: ?   General: Skin is warm and dry.  ?   Capillary Refill: Capillary refill takes less than 2 seconds.  ?Neurological:  ?   General: No focal deficit present.  ?   Mental Status: She is alert and oriented to person, place, and time. Mental status is at baseline.  ?Psychiatric:     ?   Attention and Perception: Attention and perception normal.     ?   Mood and Affect: Mood is anxious.     ?   Speech: Speech normal.     ?   Behavior: Behavior normal.     ?   Thought Content: Thought content normal.     ?   Judgment: Judgment normal.  ? ? ? ? ? ? ?

## 2022-02-16 ENCOUNTER — Telehealth: Payer: Self-pay | Admitting: Family Medicine

## 2022-02-16 NOTE — Telephone Encounter (Signed)
Pt says that dicyclomine (BENTYL) 10 MG capsule causes dizziness--too strong for her. She stopped taking it on Sunday. ? ?sucralfate (CARAFATE) 1 GM/10ML suspension was $100 she cannot afford. ? ?Pt aware pcp off and will address when  ?back in the office. ?

## 2022-02-16 NOTE — Telephone Encounter (Signed)
Can wait for PCP

## 2022-02-17 ENCOUNTER — Emergency Department (HOSPITAL_COMMUNITY): Payer: PPO

## 2022-02-17 ENCOUNTER — Other Ambulatory Visit: Payer: Self-pay

## 2022-02-17 ENCOUNTER — Inpatient Hospital Stay (HOSPITAL_COMMUNITY)
Admission: EM | Admit: 2022-02-17 | Discharge: 2022-02-21 | DRG: 381 | Disposition: A | Discharge status: Home / Self Care | Payer: PPO | Attending: Internal Medicine | Admitting: Internal Medicine

## 2022-02-17 ENCOUNTER — Telehealth (INDEPENDENT_AMBULATORY_CARE_PROVIDER_SITE_OTHER): Payer: Self-pay | Admitting: Gastroenterology

## 2022-02-17 ENCOUNTER — Encounter (HOSPITAL_COMMUNITY): Payer: Self-pay | Admitting: Emergency Medicine

## 2022-02-17 DIAGNOSIS — E782 Mixed hyperlipidemia: Secondary | ICD-10-CM | POA: Diagnosis present

## 2022-02-17 DIAGNOSIS — Z96641 Presence of right artificial hip joint: Secondary | ICD-10-CM | POA: Diagnosis present

## 2022-02-17 DIAGNOSIS — Z882 Allergy status to sulfonamides status: Secondary | ICD-10-CM

## 2022-02-17 DIAGNOSIS — K259 Gastric ulcer, unspecified as acute or chronic, without hemorrhage or perforation: Secondary | ICD-10-CM | POA: Diagnosis present

## 2022-02-17 DIAGNOSIS — E559 Vitamin D deficiency, unspecified: Secondary | ICD-10-CM | POA: Diagnosis present

## 2022-02-17 DIAGNOSIS — R197 Diarrhea, unspecified: Secondary | ICD-10-CM | POA: Diagnosis not present

## 2022-02-17 DIAGNOSIS — E872 Acidosis, unspecified: Secondary | ICD-10-CM | POA: Diagnosis present

## 2022-02-17 DIAGNOSIS — Z79899 Other long term (current) drug therapy: Secondary | ICD-10-CM

## 2022-02-17 DIAGNOSIS — Z87440 Personal history of urinary (tract) infections: Secondary | ICD-10-CM

## 2022-02-17 DIAGNOSIS — K2980 Duodenitis without bleeding: Secondary | ICD-10-CM | POA: Diagnosis present

## 2022-02-17 DIAGNOSIS — Z856 Personal history of leukemia: Secondary | ICD-10-CM | POA: Diagnosis not present

## 2022-02-17 DIAGNOSIS — Z885 Allergy status to narcotic agent status: Secondary | ICD-10-CM

## 2022-02-17 DIAGNOSIS — Z825 Family history of asthma and other chronic lower respiratory diseases: Secondary | ICD-10-CM

## 2022-02-17 DIAGNOSIS — Z823 Family history of stroke: Secondary | ICD-10-CM

## 2022-02-17 DIAGNOSIS — R112 Nausea with vomiting, unspecified: Secondary | ICD-10-CM | POA: Diagnosis present

## 2022-02-17 DIAGNOSIS — Z888 Allergy status to other drugs, medicaments and biological substances status: Secondary | ICD-10-CM | POA: Diagnosis not present

## 2022-02-17 DIAGNOSIS — M1990 Unspecified osteoarthritis, unspecified site: Secondary | ICD-10-CM | POA: Diagnosis present

## 2022-02-17 DIAGNOSIS — Z20822 Contact with and (suspected) exposure to covid-19: Secondary | ICD-10-CM | POA: Diagnosis present

## 2022-02-17 DIAGNOSIS — Z90711 Acquired absence of uterus with remaining cervical stump: Secondary | ICD-10-CM | POA: Diagnosis not present

## 2022-02-17 DIAGNOSIS — Z801 Family history of malignant neoplasm of trachea, bronchus and lung: Secondary | ICD-10-CM | POA: Diagnosis not present

## 2022-02-17 DIAGNOSIS — K631 Perforation of intestine (nontraumatic): Secondary | ICD-10-CM | POA: Diagnosis present

## 2022-02-17 DIAGNOSIS — E039 Hypothyroidism, unspecified: Secondary | ICD-10-CM | POA: Diagnosis present

## 2022-02-17 DIAGNOSIS — Z96653 Presence of artificial knee joint, bilateral: Secondary | ICD-10-CM | POA: Diagnosis present

## 2022-02-17 DIAGNOSIS — K265 Chronic or unspecified duodenal ulcer with perforation: Secondary | ICD-10-CM | POA: Diagnosis not present

## 2022-02-17 DIAGNOSIS — F419 Anxiety disorder, unspecified: Secondary | ICD-10-CM | POA: Diagnosis present

## 2022-02-17 DIAGNOSIS — M810 Age-related osteoporosis without current pathological fracture: Secondary | ICD-10-CM | POA: Diagnosis present

## 2022-02-17 DIAGNOSIS — R1013 Epigastric pain: Secondary | ICD-10-CM

## 2022-02-17 DIAGNOSIS — Z8349 Family history of other endocrine, nutritional and metabolic diseases: Secondary | ICD-10-CM

## 2022-02-17 DIAGNOSIS — Z79890 Hormone replacement therapy (postmenopausal): Secondary | ICD-10-CM

## 2022-02-17 LAB — CBC WITH DIFFERENTIAL/PLATELET
Abs Immature Granulocytes: 0.13 10*3/uL — ABNORMAL HIGH (ref 0.00–0.07)
Basophils Absolute: 0 10*3/uL (ref 0.0–0.1)
Basophils Relative: 0 %
Eosinophils Absolute: 0.1 10*3/uL (ref 0.0–0.5)
Eosinophils Relative: 1 %
HCT: 35.5 % — ABNORMAL LOW (ref 36.0–46.0)
Hemoglobin: 10.8 g/dL — ABNORMAL LOW (ref 12.0–15.0)
Immature Granulocytes: 1 %
Lymphocytes Relative: 7 %
Lymphs Abs: 0.9 10*3/uL (ref 0.7–4.0)
MCH: 26.5 pg (ref 26.0–34.0)
MCHC: 30.4 g/dL (ref 30.0–36.0)
MCV: 87.2 fL (ref 80.0–100.0)
Monocytes Absolute: 0.8 10*3/uL (ref 0.1–1.0)
Monocytes Relative: 6 %
Neutro Abs: 11.8 10*3/uL — ABNORMAL HIGH (ref 1.7–7.7)
Neutrophils Relative %: 85 %
Platelets: 338 10*3/uL (ref 150–400)
RBC: 4.07 MIL/uL (ref 3.87–5.11)
RDW: 13.8 % (ref 11.5–15.5)
WBC: 13.7 10*3/uL — ABNORMAL HIGH (ref 4.0–10.5)
nRBC: 0 % (ref 0.0–0.2)

## 2022-02-17 LAB — COMPREHENSIVE METABOLIC PANEL
ALT: 19 U/L (ref 0–44)
AST: 25 U/L (ref 15–41)
Albumin: 3.7 g/dL (ref 3.5–5.0)
Alkaline Phosphatase: 126 U/L (ref 38–126)
Anion gap: 10 (ref 5–15)
BUN: 23 mg/dL (ref 8–23)
CO2: 23 mmol/L (ref 22–32)
Calcium: 8.8 mg/dL — ABNORMAL LOW (ref 8.9–10.3)
Chloride: 103 mmol/L (ref 98–111)
Creatinine, Ser: 0.97 mg/dL (ref 0.44–1.00)
GFR, Estimated: >60 mL/min (ref >60)
Glucose, Bld: 143 mg/dL — ABNORMAL HIGH (ref 70–99)
Potassium: 4.7 mmol/L (ref 3.5–5.1)
Sodium: 136 mmol/L (ref 135–145)
Total Bilirubin: 0.4 mg/dL (ref 0.3–1.2)
Total Protein: 7.4 g/dL (ref 6.5–8.1)

## 2022-02-17 LAB — URINALYSIS, ROUTINE W REFLEX MICROSCOPIC
Bilirubin Urine: NEGATIVE
Glucose, UA: NEGATIVE mg/dL
Hgb urine dipstick: NEGATIVE
Ketones, ur: NEGATIVE mg/dL
Leukocytes,Ua: NEGATIVE
Nitrite: NEGATIVE
Protein, ur: NEGATIVE mg/dL
Specific Gravity, Urine: 1.016 (ref 1.005–1.030)
pH: 8 (ref 5.0–8.0)

## 2022-02-17 LAB — RESP PANEL BY RT-PCR (FLU A&B, COVID) ARPGX2
Influenza A by PCR: NEGATIVE
Influenza B by PCR: NEGATIVE
SARS Coronavirus 2 by RT PCR: NEGATIVE

## 2022-02-17 LAB — MAGNESIUM: Magnesium: 2.2 mg/dL (ref 1.7–2.4)

## 2022-02-17 LAB — LACTIC ACID, PLASMA: Lactic Acid, Venous: 2.4 mmol/L (ref 0.5–1.9)

## 2022-02-17 LAB — LIPASE, BLOOD: Lipase: 31 U/L (ref 11–51)

## 2022-02-17 MED ORDER — LORAZEPAM 2 MG/ML IJ SOLN
0.5000 mg | Freq: Four times a day (QID) | INTRAMUSCULAR | Status: DC | PRN
Start: 2022-02-17 — End: 2022-02-21
  Administered 2022-02-19 – 2022-02-20 (×2): 0.5 mg via INTRAVENOUS
  Filled 2022-02-17 (×2): qty 1

## 2022-02-17 MED ORDER — ONDANSETRON HCL 4 MG/2ML IJ SOLN: 4.0000 mg | Freq: Four times a day (QID) | INTRAMUSCULAR | Status: DC | PRN

## 2022-02-17 MED ORDER — ACETAMINOPHEN 325 MG PO TABS
650.0000 mg | ORAL_TABLET | Freq: Four times a day (QID) | ORAL | Status: DC | PRN
  Administered 2022-02-18: 05:00:00 650 mg via ORAL
  Filled 2022-02-17: qty 2

## 2022-02-17 MED ORDER — ONDANSETRON HCL 4 MG/2ML IJ SOLN
4.0000 mg | Freq: Once | INTRAMUSCULAR | Status: AC
  Administered 2022-02-17: 4 mg via INTRAVENOUS
  Filled 2022-02-17: qty 2

## 2022-02-17 MED ORDER — IOHEXOL 300 MG/ML  SOLN
100.0000 mL | Freq: Once | INTRAMUSCULAR | Status: AC | PRN
  Administered 2022-02-17: 100 mL via INTRAVENOUS

## 2022-02-17 MED ORDER — ONDANSETRON HCL 4 MG PO TABS
4.0000 mg | ORAL_TABLET | Freq: Four times a day (QID) | ORAL | Status: DC | PRN
Start: 2022-02-17 — End: 2022-02-21

## 2022-02-17 MED ORDER — PANTOPRAZOLE INFUSION (NEW) - SIMPLE MED
8.0000 mg/h | INTRAVENOUS | Status: DC
  Filled 2022-02-17: qty 100

## 2022-02-17 MED ORDER — PIPERACILLIN-TAZOBACTAM 3.375 G IVPB 30 MIN: 3.3750 g | Freq: Three times a day (TID) | INTRAVENOUS | Status: DC

## 2022-02-17 MED ORDER — LEVOTHYROXINE SODIUM 112 MCG PO TABS
112.0000 ug | ORAL_TABLET | Freq: Every day | ORAL | Status: DC
  Administered 2022-02-17 – 2022-02-21 (×5): 112 ug via ORAL
  Filled 2022-02-17 (×5): qty 1

## 2022-02-17 MED ORDER — PANTOPRAZOLE SODIUM 40 MG IV SOLR: 40.0000 mg | Freq: Two times a day (BID) | INTRAVENOUS | Status: DC

## 2022-02-17 MED ORDER — OXYCODONE HCL 5 MG PO TABS
5.0000 mg | ORAL_TABLET | ORAL | Status: DC | PRN
  Administered 2022-02-17 – 2022-02-21 (×10): 5 mg via ORAL
  Filled 2022-02-17 (×10): qty 1

## 2022-02-17 MED ORDER — PIPERACILLIN-TAZOBACTAM 3.375 G IVPB
3.3750 g | Freq: Three times a day (TID) | INTRAVENOUS | Status: DC
  Administered 2022-02-17 – 2022-02-21 (×12): 3.375 g via INTRAVENOUS
  Filled 2022-02-17 (×12): qty 50

## 2022-02-17 MED ORDER — ACETAMINOPHEN 650 MG RE SUPP: 650.0000 mg | Freq: Four times a day (QID) | RECTAL | Status: DC | PRN

## 2022-02-17 MED ORDER — MORPHINE SULFATE (PF) 4 MG/ML IV SOLN
4.0000 mg | Freq: Once | INTRAVENOUS | Status: AC
Start: 2022-02-17 — End: 2022-02-17
  Administered 2022-02-17: 4 mg via INTRAVENOUS
  Filled 2022-02-17: qty 1

## 2022-02-17 MED ORDER — ESCITALOPRAM OXALATE 10 MG PO TABS
30.0000 mg | ORAL_TABLET | Freq: Every day | ORAL | Status: DC
  Administered 2022-02-17 – 2022-02-21 (×5): 30 mg via ORAL
  Filled 2022-02-17 (×5): qty 3

## 2022-02-17 MED ORDER — MORPHINE SULFATE (PF) 2 MG/ML IV SOLN
2.0000 mg | INTRAVENOUS | Status: DC | PRN
  Administered 2022-02-17 – 2022-02-19 (×6): 2 mg via INTRAVENOUS
  Filled 2022-02-17 (×6): qty 1

## 2022-02-17 MED ORDER — PIPERACILLIN-TAZOBACTAM 3.375 G IVPB 30 MIN
3.3750 g | Freq: Once | INTRAVENOUS | Status: AC
  Administered 2022-02-17: 3.375 g via INTRAVENOUS
  Filled 2022-02-17: qty 50

## 2022-02-17 MED ORDER — SODIUM CHLORIDE 0.9 % IV BOLUS
500.0000 mL | Freq: Once | INTRAVENOUS | Status: AC
  Administered 2022-02-17: 500 mL via INTRAVENOUS

## 2022-02-17 MED ORDER — HEPARIN SODIUM (PORCINE) 5000 UNIT/ML IJ SOLN
5000.0000 [IU] | Freq: Three times a day (TID) | INTRAMUSCULAR | Status: DC
  Administered 2022-02-17 – 2022-02-21 (×13): 5000 [IU] via SUBCUTANEOUS
  Filled 2022-02-17 (×13): qty 1

## 2022-02-17 MED ORDER — SODIUM CHLORIDE 0.9 % IV SOLN: INTRAVENOUS | Status: DC

## 2022-02-17 MED ORDER — PANTOPRAZOLE 80MG IVPB - SIMPLE MED
80.0000 mg | Freq: Once | INTRAVENOUS | Status: AC
  Administered 2022-02-17: 80 mg via INTRAVENOUS
  Filled 2022-02-17: qty 100

## 2022-02-17 NOTE — Progress Notes (Addendum)
Per HPI: ?Andrea Martinez is a 76 y.o. female with medical history significant of with history of IBS, anxiety, CML, gastric ulcer, hypothyroidism, hyperlipidemia, and more presents ED with a chief complaint of abdominal pain.  Patient reports that abdominal pain has been cramping in her lower abdomen and a cramping and burning in her upper abdomen.  This started 3 weeks ago with gradual onset.  Today it is severe.  She has associated nausea and vomiting.  She has not been eating so the vomit appears as white frothy.  She has not seen any hematemesis or coffee-ground emesis.  She has had diarrhea up to 3 times per day.  No bloody stool or melena.  She has had a decreased appetite.  He has been attempting to control her symptoms with Imodium at home and Mylanta.  They have not helped.  She reports she was on an antibiotic for this, but the last antibiotic in the system was Augmentin-and she reports that was on for sinus infection.  Patient reports that her pain is much worse when she stands straight, better when she doubles over.  She has had a lot of GI issues before, but she has not had pain like this before.  Patient has no other complaints at this time. ? ?02/17/22: Patient was admitted for contained perforation of and is currently n.p.o. on IV fluid with Zosyn ongoing.  Appreciate general surgery consultation with no need for operative intervention at this time.  GI evaluation pending. ? ?Total care time: 25 minutes. ?

## 2022-02-17 NOTE — Assessment & Plan Note (Signed)
Continue synthroid.

## 2022-02-17 NOTE — Assessment & Plan Note (Addendum)
Contained perf associated with duodenitis ?Diet being advanced by general surgery ?Continue zosyn ? ?

## 2022-02-17 NOTE — Assessment & Plan Note (Addendum)
Protonix bolus in ED ?Continue protonix IV drip as recommended by GI ?Appreciate ongoing general surgery recommendations for dietary advancement ?GI to follow-up outpatient in 2-3 weeks ?Pain control with pain scale ?NPO ?Maintenance fluids ?Continue to monitor ?

## 2022-02-17 NOTE — Assessment & Plan Note (Signed)
Continue lexapro ?PRN ativan ?

## 2022-02-17 NOTE — H&P (Signed)
History and Physical    Patient: Andrea Martinez:[id number] DOB: [birth year] DOA: 02/17/2022 DOS: the patient was seen and examined on 02/17/2022 PCP: Loman Brooklyn, FNP  Patient coming from: Home  Chief Complaint:  Chief Complaint  Patient presents with   Abdominal Pain   HPI: Andrea Martinez is a 76 y.o. female with medical history significant of with history of IBS, anxiety, CML, gastric ulcer, hypothyroidism, hyperlipidemia, and more presents ED with a chief complaint of abdominal pain.  Patient reports that abdominal pain has been cramping in her lower abdomen and a cramping and burning in her upper abdomen.  This started 3 weeks ago with gradual onset.  Today it is severe.  She has associated nausea and vomiting.  She has not been eating so the vomit appears as white frothy.  She has not seen any hematemesis or coffee-ground emesis.  She has had diarrhea up to 3 times per day.  No bloody stool or melena.  She has had a decreased appetite.  He has been attempting to control her symptoms with Imodium at home and Mylanta.  They have not helped.  She reports she was on an antibiotic for this, but the last antibiotic in the system was Augmentin-and she reports that was on for sinus infection.  Patient reports that her pain is much worse when she stands straight, better when she doubles over.  She has had a lot of GI issues before, but she has not had pain like this before.  Patient has no other complaints at this time.  Patient does not smoke, does not drink alcohol, does not use illicit drugs.  She is vaccinated for COVID.  She has had 3 shots.  And her living will says do not leave her on extended life support per her report. Review of Systems: As mentioned in the history of present illness. All other systems reviewed and are negative. Past Medical History:  Diagnosis Date   Anxiety    denies   Arthritis    Chronic myelogenous leukemia (CML), BCR-ABL1-positive ([place]) 11/20/2015   Gastric ulcer  2011   EGD, 5/11   History of peptic ulcer disease    Hypothyroidism    not on meds, followed by Dr. Elyse Hsu   Lipoma    left upper arm   Mixed hyperlipidemia    Osteoporosis    Pericardial effusion    a. HCAP complicated by pericardial effusion requiring pericardial window 10/2016 and large L pleural effusion requring VATS.   Pleural effusion    a. s/p VATS 2017.   Pneumonia 10/2016   PONV (postoperative nausea and vomiting)    history of   Prolonged QT interval    Urine incontinence    UTI (lower urinary tract infection)    frequent   Vitamin D deficiency    Past Surgical History:  Procedure Laterality Date   BRAVO [place] STUDY  12/04/2012   Procedure: BRAVO [place];  Surgeon: [name], MD;  [facility];  Service: Endoscopy;  Laterality: N/A;   CHOLECYSTECTOMY N/A 01/03/2014   Procedure: LAPAROSCOPIC CHOLECYSTECTOMY WITH INTRAOPERATIVE CHOLANGIOGRAM;  Surgeon: Joyice Faster. Cornett, MD;  Location: [place];  Service: General;  Laterality: N/A;   COLONOSCOPY  01/2004   DR [facility], few small tics   ESOPHAGOGASTRODUODENOSCOPY  02/2010   gastric ulcers   ESOPHAGOGASTRODUODENOSCOPY  12/04/2012   JIR:CVELFYB gastritis (inflammation) was found in the gastric antrum; multiple biopsies The duodenal mucosa showed no abnormalities in the  bulb and second portion of the duodenum   JOINT REPLACEMENT Right 10/22/2019   KYPHOPLASTY N/A 02/14/2019   Procedure: KYPHOPLASTY T12 and L1;  Surgeon: Melina Schools, MD;  Location: [place];  Service: Orthopedics;  Laterality: N/A;  120 mins   LIPOMA EXCISION  08/02/2011   left shoulder   NOSE SURGERY     PARTIAL HYSTERECTOMY     vaginal at age 33 years of age   TOE 91 Left 1962   lt great toe   TOTAL HIP ARTHROPLASTY Right 11/18/2021   Procedure: TOTAL HIP ARTHROPLASTY ANTERIOR APPROACH;  Surgeon: Gaynelle Arabian, MD;  [facility]: [facility];  Service: Orthopedics;  Laterality: Right;   TOTAL KNEE ARTHROPLASTY Right  10/22/2019   Procedure: TOTAL KNEE ARTHROPLASTY;  Surgeon: Gaynelle Arabian, MD;  Location: [facility];  Service: Orthopedics;  Laterality: Right;  53mn   TOTAL KNEE ARTHROPLASTY Left 03/03/2020   Procedure: TOTAL KNEE ARTHROPLASTY;  Surgeon: AGaynelle Arabian MD;  [facility];  Service: Orthopedics;  Laterality: Left;  560m   TRANSFORAMINAL LUMBAR INTERBODY FUSION (TLIF) WITH PEDICLE SCREW FIXATION 1 LEVEL N/A 01/28/2021   Procedure: TRANSFORAMINAL LUMBAR INTERBODY FUSION (TLIF) LUMBAR FIVE-SACRAL ONE;  Surgeon: BrMelina SchoolsMD;  [place] [facility] Service: Orth[facility]ics;  Laterality: N/A;  4 hrs   VIDEO ASSISTED THORACOSCOPY (VATS)/EMPYEMA Left 10/25/2016   Procedure: VIDEO ASSISTED THORACOSCOPY (VATS), BRONCH,DRAINAGE OF PLEURAL EFFUSION,PERICARDIAL WINDOW WITH DRAINAGE OF PERICARDIAL FLUID, TEE;  Surgeon: StMelrose NakayamaMD;  [facility] Service: Thoracic;  Laterality: Left;   VIDEO BRONCHOSCOPY N/A 10/25/2016   Procedure: VIDEO BRONCHOSCOPY;  Surgeon: StMelrose NakayamaMD;  [facility] Service: Thoracic;  Laterality: N/A;   Social History:  reports that she has never smoked. She has never used smokeless tobacco. She reports that she does not drink alcohol and does not use drugs.  Allergies  Allergen Reactions   Nucynta [Tapentadol Hcl] Swelling   Codeine Nausea Only and Other (See Comments)    Headache   Sulfonamide Derivatives Nausea Only and Other (See Comments)    Headache   Esomeprazole Magnesium Nausea Only   Fluconazole Rash    Redness and blistering on left thigh    Family History  Problem Relation Age of Onset   Lung cancer Father    COPD Mother    Stroke Mother    Thyroid disease Mother    Colon cancer Neg Hx    Anesthesia problems Neg Hx    Hypotension Neg Hx    Malignant hyperthermia Neg Hx    Pseudochol deficiency Neg Hx     Prior to Admission medications   Medication Sig Start Date End Date Taking? Authorizing Provider  acetaminophen  (TYLENOL) 500 MG tablet Take 1,000 mg by mouth every 8 (eight) hours as needed for moderate pain.    [provider]  amoxicillin-clavulanate (AUGMENTIN) 875-125 MG tablet Take 1 tablet by mouth 2 (two) times daily. 01/26/22   MaHassell Doneary-Margaret, FNP  cetirizine (ZYRTEC) 10 MG tablet Take 10 mg by mouth daily.    [provider]  Cholecalciferol (VITAMIN D3) 50 MCG (2000 UT) TABS Take 2,000 Units by mouth daily in the afternoon.    [provider]  dicyclomine (BENTYL) 10 MG capsule Take 1 capsule (10 mg total) by mouth 4 (four) times daily -  before meals and at bedtime. 02/11/22   JoLoman BrooklynFNP  escitalopram (LEXAPRO) 20 MG tablet TAKE 1 AND 1/2 TABLETS BY MOUTH DAILY 12/21/21  Loman Brooklyn, FNP  lactose free nutrition (BOOST) LIQD Take 237 mLs by mouth daily.    [provider]  levothyroxine (SYNTHROID) 112 MCG tablet Take 1 tablet (112 mcg total) by mouth daily before breakfast. 10/05/21   Nida, Marella Chimes, MD  Multiple Vitamins-Minerals (CENTRUM SILVER PO) Take 1 tablet by mouth daily in the afternoon.    [provider]  nilotinib (TASIGNA) 150 MG capsule Take 2 capsules (300 mg total) by mouth every 12 (twelve) hours. 11/16/21   Derek Jack, MD  Probiotic Product (PROBIOTIC DAILY PO) Take 2 tablets by mouth daily in the afternoon.    [provider]  sucralfate (CARAFATE) 1 GM/10ML suspension Take 10 mLs (1 g total) by mouth 4 (four) times daily -  with meals and at bedtime. 02/11/22   Loman Brooklyn, FNP    Physical Exam: Vitals:   02/17/22 0107 02/17/22 0300 02/17/22 0330 02/17/22 0400  BP: (!) 144/73 (!) 160/75 (!) 159/91 (!) 145/48  Pulse: (!) 110 84 94 74  Resp: 18 19 (!) 21 19  Temp: 98.1 F (36.7 C)     TempSrc: Oral     SpO2: 97% 100% 93% 100%  Weight:      Height:       1.  General: Patient lying supine in bed, no acute distress   2. Psychiatric: Alert and oriented x 3, mood and behavior  normal for situation, pleasant and cooperative with exam   3. Neurologic: Speech and language are normal, face is symmetric, moves all 4 extremities voluntarily, at baseline without acute deficits on limited exam   4. HEENMT:  Hard of hearing, head is atraumatic, normocephalic, pupils reactive to light, neck is supple, trachea is midline, mucous membranes are moist   5. Respiratory : Lungs are clear to auscultation bilaterally without wheezing, rhonchi, rales, no cyanosis, no increase in work of breathing or accessory muscle use   6. Cardiovascular : Heart rate normal, rhythm is regular, no murmurs, rubs or gallops, no peripheral edema, peripheral pulses palpated   7. Gastrointestinal:  Abdomen is soft, tender in the midline in the epigastric periumbilical and pelvic areas, also tender in the left lower quadrant, but not the right upper quadrant or right lower quadrant.  No masses or organomegaly palpated   8. Skin:  Skin is warm, dry and intact without rashes, acute lesions, or ulcers on limited exam   9.Musculoskeletal:  No acute deformities or trauma, no asymmetry in tone, no peripheral edema, peripheral pulses palpated, no tenderness to palpation in the extremities  Data Reviewed: In the ED Temp 98.1, heart rate 71-110, respiratory rate 18-21, O2 93 to 100% Tachycardia thought to be related to pain Patient is a leukocytosis 13.7-related to duodenitis and perf Hemoglobin 10.8, hematocrit 35.5 Chemistry panel unremarkable Initial lactic acid 2.4, repeat pending UA pending CT shows proximal duodenitis with continued for, acute gastritis, and sigmoid diverticulosis KUB shows normal bowel gas pattern EKG shows a heart rate of 80, sinus rhythm, QTc 443 Patient was started on Zosyn, bolused with Protonix, given morphine and a 500 cc fluid bolus   Assessment and Plan: * Perforation of duodenum (HCC) Contained perf associated with duodenitis NPO Gen surg consulted from ED and  plans to see patient in the AM Continue zosyn   Duodenitis Protonix bolus in ED Continue protonix IV BID Consult GI  Pain control with pain scale NPO Maintenance fluids Continue to monitor  Anxiety Continue lexapro PRN ativan Patient complains of nervous  during admission, was offered Ativan but reports she would rather not take it yet  Hypothyroidism Continue synthroid      Advance Care Planning:   Code Status: Full Code   Consults: General surgery and gastroenterology  Family Communication: No family at bedside  Severity of Illness: The appropriate patient status for this patient is INPATIENT. Inpatient status is judged to be reasonable and necessary in order to provide the required intensity of service to ensure the patient's safety. The patient's presenting symptoms, physical exam findings, and initial radiographic and laboratory data in the context of their chronic comorbidities is felt to place them at high risk for further clinical deterioration. Furthermore, it is not anticipated that the patient will be medically stable for discharge from the hospital within 2 midnights of admission.   * I certify that at the point of admission it is my clinical judgment that the patient will require inpatient hospital care spanning beyond 2 midnights from the point of admission due to high intensity of service, high risk for further deterioration and high frequency of surveillance required.*  Author: Rolla Plate, DO 02/17/2022 6:11 AM  For on call review [email or web address].

## 2022-02-17 NOTE — ED Triage Notes (Signed)
Pt c/o abd pain for weeks and vomiting started yesterday.  ?

## 2022-02-17 NOTE — ED Provider Notes (Signed)
[facility] EMERGENCY DEPARTMENT Provider Note   CSN: [id number] Arrival date & time: 02/17/22  0054     History  Chief Complaint  Patient presents with   Abdominal Pain    Andrea Martinez is a 76 y.o. female.  Presents to the emergency department for evaluation of abdominal pain.  Patient has been having upper abdominal pain for "weeks".  She has been having diarrhea associated with the abdominal pain, using Imodium.  Patient reports that the diarrhea has improved but now she is having nausea and vomiting for the last 2 days.  Pain has intensified.      Home Medications Prior to Admission medications   Medication Sig Start Date End Date Taking? Authorizing Provider  acetaminophen (TYLENOL) 500 MG tablet Take 1,000 mg by mouth every 8 (eight) hours as needed for moderate pain.    [provider]  amoxicillin-clavulanate (AUGMENTIN) 875-125 MG tablet Take 1 tablet by mouth 2 (two) times daily. 01/26/22   Hassell Done Isis-Margaret, FNP  cetirizine (ZYRTEC) 10 MG tablet Take 10 mg by mouth daily.    [provider]  Cholecalciferol (VITAMIN D3) 50 MCG (2000 UT) TABS Take 2,000 Units by mouth daily in the afternoon.    [provider]  dicyclomine (BENTYL) 10 MG capsule Take 1 capsule (10 mg total) by mouth 4 (four) times daily -  before meals and at bedtime. 02/11/22   Loman Brooklyn, FNP  escitalopram (LEXAPRO) 20 MG tablet TAKE 1 AND 1/2 TABLETS BY MOUTH DAILY 12/21/21   Loman Brooklyn, FNP  lactose free nutrition (BOOST) LIQD Take 237 mLs by mouth daily.    [provider]  levothyroxine (SYNTHROID) 112 MCG tablet Take 1 tablet (112 mcg total) by mouth daily before breakfast. 10/05/21   Nida, Marella Chimes, MD  Multiple Vitamins-Minerals (CENTRUM SILVER PO) Take 1 tablet by mouth daily in the afternoon.    [provider]  nilotinib (TASIGNA) 150 MG capsule Take 2 capsules (300 mg total) by mouth every 12 (twelve) hours. 11/16/21   Derek Jack, MD  Probiotic Product (PROBIOTIC DAILY PO) Take 2 tablets by mouth daily in the afternoon.    [provider]  sucralfate (CARAFATE) 1 GM/10ML suspension Take 10 mLs (1 g total) by mouth 4 (four) times daily -  with meals and at bedtime. 02/11/22   Loman Brooklyn, FNP      Allergies    Nucynta [tapentadol hcl], Codeine, Sulfonamide derivatives, Esomeprazole magnesium, and Fluconazole    Review of Systems   Review of Systems  Gastrointestinal:  Positive for abdominal pain, diarrhea, nausea and vomiting.   Physical Exam Updated Vital Signs BP (!) 145/48    Pulse 74    Temp 98.1 F (36.7 C) (Oral)    Resp 19    Ht '5\' 5"'$  (1.651 m)    Wt 92.1 kg    SpO2 100%    BMI 33.78 kg/m  Physical Exam Vitals and nursing note reviewed.  Constitutional:      General: She is not in acute distress.    Appearance: She is well-developed.  HENT:     Head: Normocephalic and atraumatic.     Mouth/Throat:     Mouth: Mucous membranes are moist.  Eyes:     General: Vision grossly intact. Gaze aligned appropriately.     Extraocular Movements: Extraocular movements intact.     Conjunctiva/sclera: Conjunctivae normal.  Cardiovascular:     Rate and Rhythm: Normal rate and regular rhythm.  Pulses: Normal pulses.     Heart sounds: Normal heart sounds, S1 normal and S2 normal. No murmur heard.   No friction rub. No gallop.  Pulmonary:     Effort: Pulmonary effort is normal. No respiratory distress.     Breath sounds: Normal breath sounds.  Abdominal:     General: Bowel sounds are normal. There is distension.     Palpations: Abdomen is soft.     Tenderness: There is generalized abdominal tenderness. There is no guarding or rebound.     Hernia: No hernia is present.  Musculoskeletal:        General: No swelling.     Cervical back: Full passive range of motion without pain, normal range of motion and neck supple. No spinous process tenderness or muscular tenderness. Normal range of  motion.     Right lower leg: No edema.     Left lower leg: No edema.  Skin:    General: Skin is warm and dry.     Capillary Refill: Capillary refill takes less than 2 seconds.     Findings: No ecchymosis, erythema, rash or wound.  Neurological:     General: No focal deficit present.     Mental Status: She is alert and oriented to person, place, and time.     GCS: GCS eye subscore is 4. GCS verbal subscore is 5. GCS motor subscore is 6.     Cranial Nerves: Cranial nerves 2-12 are intact.     Sensory: Sensation is intact.     Motor: Motor function is intact.     Coordination: Coordination is intact.  Psychiatric:        Attention and Perception: Attention normal.        Mood and Affect: Mood normal.        Speech: Speech normal.        Behavior: Behavior normal.    ED Results / Procedures / Treatments   Labs (all labs ordered are listed, but only abnormal results are displayed) Labs Reviewed  CBC WITH DIFFERENTIAL/PLATELET - Abnormal; Notable for the following components:      Result Value   WBC 13.7 (*)    Hemoglobin 10.8 (*)    HCT 35.5 (*)    Neutro Abs 11.8 (*)    Abs Immature Granulocytes 0.13 (*)    All other components within normal limits  COMPREHENSIVE METABOLIC PANEL - Abnormal; Notable for the following components:   Glucose, Bld 143 (*)    Calcium 8.8 (*)    All other components within normal limits  LACTIC ACID, PLASMA - Abnormal; Notable for the following components:   Lactic Acid, Venous 2.4 (*)    All other components within normal limits  RESP PANEL BY RT-PCR (FLU A&B, COVID) ARPGX2  LIPASE, BLOOD  URINALYSIS, ROUTINE W REFLEX MICROSCOPIC    EKG EKG Interpretation  Date/Time:  Wednesday February 17 2022 01:57:07 EST Ventricular Rate:  80 PR Interval:  160 QRS Duration: 81 QT Interval:  384 QTC Calculation: 443 R Axis:   39 Text Interpretation: Sinus rhythm Confirmed by Orpah Greek [phone]) on 02/17/2022 2:50:43 AM  Radiology CT ABDOMEN  PELVIS W CONTRAST  Result Date: 02/17/2022 CLINICAL DATA:  Abdominal pain and vomiting. EXAM: CT ABDOMEN AND PELVIS WITH CONTRAST TECHNIQUE: Multidetector CT imaging of the abdomen and pelvis was performed using the standard protocol following bolus administration of intravenous contrast. RADIATION DOSE REDUCTION: This exam was performed according to the departmental dose-optimization program which includes automated exposure  control, adjustment of the mA and/or kV according to patient size and/or use of iterative reconstruction technique. CONTRAST:  116m OMNIPAQUE IOHEXOL 300 MG/ML  SOLN COMPARISON:  December 24, 2018 FINDINGS: Lower chest: Stable 5 mm solid noncalcified lung nodule is seen within the lateral aspect of the right lung base. Hepatobiliary: No focal liver abnormality is seen. Status post cholecystectomy. A distal common bile duct is dilated and measures approximately 0.1 cm. Pancreas: Unremarkable. No pancreatic ductal dilatation or surrounding inflammatory changes. Spleen: Normal in size without focal abnormality. Adrenals/Urinary Tract: No adrenal hemorrhage or renal injury identified. Urinary bladder is poorly distended and subsequently limited evaluation. Overlying streak artifact is also seen. Stomach/Bowel: A 2.2 cm x 1.4 cm x 1.2 cm area of fluid and air, with a thin, hyperdense wall, is seen adjacent to the posteromedial aspect of the duodenal bulb which is markedly inflamed. There is no evidence of bowel dilatation. Noninflamed diverticula are seen throughout the sigmoid colon. Vascular/Lymphatic: No significant vascular findings are present. No enlarged abdominal or pelvic lymph nodes. Reproductive: Status post hysterectomy. No adnexal masses. Other: No abdominal wall hernia or abnormality. No abdominopelvic ascites. 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  There is evidence of prior vertebroplasty at the levels of T12 and L1 with postoperative changes seen at the level of L5-S1. IMPRESSION: 1. Marked severity proximal duodenitis at the level of the duodenal bulb, with additional findings consistent with a contained perforation. Acute gastritis involving the adjacent portion of the gastric antrum cannot be excluded. 2. Sigmoid diverticulosis. 3. Evidence of prior vertebroplasty at the levels of T12 and L1 with postoperative changes seen at the level of L5-S1. Electronically Signed   By: TVirgina NorfolkM.D.   On: 02/17/2022 03:56   DG ABD ACUTE 2+V W 1V CHEST  Result Date: 02/17/2022 CLINICAL DATA:  Abdominal pain and vomiting. EXAM: DG ABDOMEN ACUTE WITH 1 VIEW CHEST COMPARISON:  Abdominopelvic CT 12/24/2018 FINDINGS: Upper normal heart size. Subsegmental atelectasis at the right lung base and left mid lung. No confluent consolidation. Biapical pleuroparenchymal scarring. No pleural fluid. No bowel dilatation to suggest obstruction. No significant formed stool in the colon. Cholecystectomy clips in the right upper quadrant. Thoraco lumbar vertebral augmentation. Lower lumbar surgical hardware. Right hip arthroplasty. IMPRESSION: 1. Normal bowel gas pattern without evidence of obstruction. 2. Subsegmental atelectasis at the right lung base and left midlung. Electronically Signed   By: MKeith RakeM.D.   On: 02/17/2022 01:42    Procedures Procedures    Medications Ordered in ED Medications  piperacillin-tazobactam (ZOSYN) IVPB 3.375 g (has no administration in time range)  sodium chloride 0.9 % bolus 500 mL (has no administration in time range)  pantoprazole (PROTONIX) 80 mg /NS 100 mL IVPB (has no administration in time range)  pantoprozole (PROTONIX) 80 mg /NS 100 mL infusion (has no administration in time range)  pantoprazole (PROTONIX) injection 40 mg (has no administration in time range)  morphine (PF) 4 MG/ML injection 4 mg (4 mg Intravenous  Given 02/17/22 0254)  ondansetron (ZOFRAN) injection 4 mg (4 mg Intravenous Given 02/17/22 0256)  iohexol (OMNIPAQUE) 300 MG/ML solution 100 mL (100 mLs Intravenous Contrast Given 02/17/22 0315)    ED Course/ Medical Decision Making/ A&P                           Medical Decision Making  Amount and/or Complexity of Data Reviewed Labs: ordered. Radiology: ordered.  Risk Prescription drug management.   Presents to the Emergency Department for evaluation of severe abdominal pain.  Patient reports that she has been having pain for a couple of weeks, now with nausea and vomiting.  Frontal diagnosis includes bowel obstruction, GERD, peptic ulcer disease, diverticulitis, colitis, perforated viscus  Patient with mild distention and severe diffuse tenderness arrival.  She underwent acute abdominal series to rule out free air, no acute abnormality was seen.  Blood work reveals mild leukocytosis, otherwise no abnormalities.  Patient underwent CT scan to further evaluate.  Patient has severe duodenitis with contained perforation.  Further discussion with the patient reveals she does have a history of peptic ulcers.  We will initiate Protonix bolus and infusion.  Zosyn for broad-spectrum coverage.  Discussed with Dr. Arnoldo Morale, confirms no surgery needed, will follow.  Will admit to hospitalist, will need GI consultation in the morning.  CRITICAL CARE Performed by: Orpah Greek   Total critical care time: 33 minutes  Critical care time was exclusive of separately billable procedures and treating other patients.  Critical care was necessary to treat or prevent imminent or life-threatening deterioration.  Critical care was time spent personally by me on the following activities: development of treatment plan with patient and/or surrogate as well as nursing, discussions with consultants, evaluation of patient's response to treatment, examination of patient, obtaining history from patient or  surrogate, ordering and performing treatments and interventions, ordering and review of laboratory studies, ordering and review of radiographic studies, pulse oximetry and re-evaluation of patient's condition.         Final Clinical Impression(s) / ED Diagnoses Final diagnoses:  Duodenitis    Rx / DC Orders ED Discharge Orders     None         Kathyann Spaugh, Gwenyth Allegra, MD 02/17/22 [phone]

## 2022-02-17 NOTE — ED Notes (Signed)
Attempted report 

## 2022-02-17 NOTE — Consult Note (Signed)
Reason for Consult: Duodenitis with contained perforation  Referring Physician: Dr. Orpah Greek, MD  Andrea Martinez is an 76 y.o. female.  HPI: Patient has PMH significant for peptic ulcer disease, CML, and hypothyroidism presenting with several weeks of severe abdominal pain with associated watery diarrhea. Abdominal pain is diffuse throughout her abdomen but most severe in the lower abdominal region, worse on the left. She also endorses burning sensation in the epigastric region. She denies hematochezia or hematemesis. She also endorses nausea and dry heaving. She states eating makes her pain worse and nothing has improved her pain. She reports some chills however has not taken her temperature. Patient does have history of peptic ulcer disease 7-8 years ago, which patient states was treated with medical management. Last EGD was completed around this time. Surgical history includes cholecystectomy several years ago.   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 Elyse Hsu   Lipoma    left upper arm   Mixed hyperlipidemia    Osteoporosis    Pericardial effusion    a. HCAP complicated by pericardial effusion requiring pericardial window 10/2016 and large L pleural effusion requring VATS.   Pleural effusion    a. s/p VATS 2017.   Pneumonia 10/2016   PONV (postoperative nausea and vomiting)    history of   Prolonged QT interval    Urine incontinence    UTI (lower urinary tract infection)    frequent   Vitamin D deficiency     Past Surgical History:  Procedure Laterality Date   BRAVO [place] STUDY  12/04/2012   Procedure: BRAVO [place];  Surgeon: [name], MD;  [facility];  Service: Endoscopy;  Laterality: N/A;   CHOLECYSTECTOMY N/A 01/03/2014    Procedure: LAPAROSCOPIC CHOLECYSTECTOMY WITH INTRAOPERATIVE CHOLANGIOGRAM;  Surgeon: Joyice Faster. Cornett, MD;  Location: [place];  Service: General;  Laterality: N/A;   COLONOSCOPY  01/2004   DR [facility], few small tics   ESOPHAGOGASTRODUODENOSCOPY  02/2010   gastric ulcers   ESOPHAGOGASTRODUODENOSCOPY  12/04/2012   NGE:[id number] gastritis (inflammation) was found in the gastric antrum; multiple biopsies The duodenal mucosa showed no abnormalities in the bulb and second portion of the duodenum   JOINT REPLACEMENT Right 10/22/2019   KYPHOPLASTY N/A 02/14/2019   Procedure: KYPHOPLASTY T12 and L1;  Surgeon: Melina Schools, MD;  Location: [place];  Service: Orthopedics;  Laterality: N/A;  120 mins   LIPOMA EXCISION  08/02/2011   left shoulder   NOSE SURGERY     PARTIAL HYSTERECTOMY     vaginal at age 67 years of age   TOE 17 Left 1962   lt great toe   TOTAL HIP ARTHROPLASTY Right 11/18/2021   Procedure: TOTAL HIP ARTHROPLASTY ANTERIOR APPROACH;  Surgeon: Gaynelle Arabian, MD;  [facility]: [facility];  Service: Orthopedics;  Laterality: Right;   TOTAL KNEE ARTHROPLASTY Right 10/22/2019   Procedure: TOTAL KNEE ARTHROPLASTY;  Surgeon: Gaynelle Arabian, MD;  Location: [facility];  Service: Orthopedics;  Laterality: Right;  72mn   TOTAL KNEE ARTHROPLASTY Left 03/03/2020   Procedure: TOTAL KNEE ARTHROPLASTY;  Surgeon: AGaynelle Arabian MD;  [facility];  Service: Orthopedics;  Laterality: Left;  540m   TRANSFORAMINAL LUMBAR INTERBODY FUSION (  TLIF) WITH PEDICLE SCREW FIXATION 1 LEVEL N/A 01/28/2021   Procedure: TRANSFORAMINAL LUMBAR INTERBODY FUSION (TLIF) LUMBAR FIVE-SACRAL ONE;  Surgeon: Melina Schools, MD;  Location: [place];  Service: Orthopedics;  Laterality: N/A;  4 hrs   VIDEO ASSISTED THORACOSCOPY (VATS)/EMPYEMA Left 10/25/2016   Procedure: VIDEO ASSISTED THORACOSCOPY (VATS), BRONCH,DRAINAGE OF PLEURAL EFFUSION,PERICARDIAL WINDOW WITH DRAINAGE OF PERICARDIAL FLUID, TEE;  Surgeon: [name], MD;  Location: [place];  Service: Thoracic;  Laterality: Left;   VIDEO BRONCHOSCOPY N/A 10/25/2016   Procedure: VIDEO BRONCHOSCOPY;  Surgeon: [name], MD;  Location: [place];  Service: Thoracic;  Laterality: N/A;    Family History  Problem Relation Age of Onset   Lung cancer Father    COPD Mother    Stroke Mother    Thyroid disease Mother    Colon cancer Neg Hx    Anesthesia problems Neg Hx    Hypotension Neg Hx    Malignant hyperthermia Neg Hx    Pseudochol deficiency Neg Hx     Social History:  reports that she has never smoked. She has never used smokeless tobacco. She reports that she does not drink alcohol and does not use drugs.  Allergies:  Allergies  Allergen Reactions   Nucynta [Tapentadol Hcl] Swelling   Codeine Nausea Only and Other (See Comments)    Headache   Sulfonamide Derivatives Nausea Only and Other (See Comments)    Headache   Esomeprazole Magnesium Nausea Only   Fluconazole Rash    Redness and blistering on left thigh    Medications: I have reviewed the patient's current medications.  Results for orders placed or performed during the hospital encounter of 02/17/22 (from the past 48 hour(s))  CBC with Differential/Platelet     Status: Abnormal   Collection Time: 02/17/22  1:45 AM  Result Value Ref Range   WBC 13.7 (H) 4.0 - 10.5 K/uL   RBC 4.07 3.87 - 5.11 MIL/uL   Hemoglobin 10.8 (L) 12.0 - 15.0 g/dL   HCT 35.5 (L) 36.0 - 46.0 %   MCV 87.2 80.0 - 100.0 fL   MCH 26.5 26.0 - 34.0 pg   MCHC 30.4 30.0 - 36.0 g/dL   RDW 13.8 11.5 - 15.5 %   Platelets 338 150 - 400 K/uL   nRBC 0.0 0.0 - 0.2 %   Neutrophils Relative % 85 %   Neutro Abs 11.8 (H) 1.7 - 7.7 K/uL   Lymphocytes Relative 7 %   Lymphs Abs 0.9 0.7 - 4.0 K/uL   Monocytes Relative 6 %   Monocytes Absolute 0.8 0.1 - 1.0 K/uL   Eosinophils Relative 1 %   Eosinophils Absolute 0.1 0.0 - 0.5 K/uL   Basophils Relative 0 %   Basophils Absolute 0.0 0.0 - 0.1 K/uL   Immature  Granulocytes 1 %   Abs Immature Granulocytes 0.13 (H) 0.00 - 0.07 K/uL    Comment: Performed at [facility], [address] [place], [place] [address]  Comprehensive metabolic panel     Status: Abnormal   Collection Time: 02/17/22  1:45 AM  Result Value Ref Range   Sodium 136 135 - 145 mmol/L   Potassium 4.7 3.5 - 5.1 mmol/L   Chloride 103 98 - 111 mmol/L   CO2 23 22 - 32 mmol/L   Glucose, Bld 143 (H) 70 - 99 mg/dL    Comment: Glucose reference range applies only to samples taken after fasting for at least 8 hours.   BUN 23 8 -  23 mg/dL   Creatinine, Ser 0.97 0.44 - 1.00 mg/dL   Calcium 8.8 (L) 8.9 - 10.3 mg/dL   Total Protein 7.4 6.5 - 8.1 g/dL   Albumin 3.7 3.5 - 5.0 g/dL   AST 25 15 - 41 U/L   ALT 19 0 - 44 U/L   Alkaline Phosphatase 126 38 - 126 U/L   Total Bilirubin 0.4 0.3 - 1.2 mg/dL   GFR, Estimated >60 >60 mL/min    Comment: (NOTE) Calculated using the CKD-EPI Creatinine Equation (2021)    Anion gap 10 5 - 15    Comment: Performed at [facility], [address] [place], [place] [address]  Lactic acid, plasma     Status: Abnormal   Collection Time: 02/17/22  1:45 AM  Result Value Ref Range   Lactic Acid, Venous 2.4 (HH) 0.5 - 1.9 mmol/L    Comment: CRITICAL RESULT CALLED TO, READ BACK BY AND VERIFIED WITH: Bel[place],K'@0228'$  by Zigmund Daniel, b 3.8.23 Performed at [facility], [address], [place], [place] [address]   Lipase, blood     Status: None   Collection Time: 02/17/22  1:45 AM  Result Value Ref Range   Lipase 31 11 - 51 U/L    Comment: Performed at [facility], [address], [place], [place] [address]  Magnesium     Status: None   Collection Time: 02/17/22  1:45 AM  Result Value Ref Range   Magnesium 2.2 1.7 - 2.4 mg/dL    Comment: Performed at [facility], [address], [place], [place] [address]  Urinalysis, Routine w reflex microscopic Urine, In & Out Cath     Status: None   Collection Time: 02/17/22  3:00 AM  Result Value Ref Range   Color, Urine  YELLOW YELLOW   APPearance CLEAR CLEAR   Specific Gravity, Urine 1.016 1.005 - 1.030   pH 8.0 5.0 - 8.0   Glucose, UA NEGATIVE NEGATIVE mg/dL   Hgb urine dipstick NEGATIVE NEGATIVE   Bilirubin Urine NEGATIVE NEGATIVE   Ketones, ur NEGATIVE NEGATIVE mg/dL   Protein, ur NEGATIVE NEGATIVE mg/dL   Nitrite NEGATIVE NEGATIVE   Leukocytes,Ua NEGATIVE NEGATIVE    Comment: Performed at [facility], [address] [place], [place] [address]  Resp Panel by RT-PCR (Flu A&B, Covid) Nasopharyngeal Swab     Status: None   Collection Time: 02/17/22  4:22 AM   Specimen: Nasopharyngeal Swab; Nasopharyngeal(NP) swabs in vial transport medium  Result Value Ref Range   SARS Coronavirus 2 by RT PCR NEGATIVE NEGATIVE    Comment: (NOTE) SARS-CoV-2 target nucleic acids are NOT DETECTED.  The SARS-CoV-2 RNA is generally detectable in upper respiratory specimens during the acute phase of infection. The lowest concentration of SARS-CoV-2 viral copies this assay can detect is 138 copies/mL. A negative result does not preclude SARS-Cov-2 infection and should not be used as the sole basis for treatment or other patient management decisions. A negative result may occur with  improper specimen collection/handling, submission of specimen other than nasopharyngeal swab, presence of viral mutation(s) within the areas targeted by this assay, and inadequate number of viral copies(<138 copies/mL). A negative result must be combined with clinical observations, patient history, and epidemiological information. The expected result is Negative.  Fact Sheet for Patients:  EntrepreneurPulse.com.au  Fact Sheet for Healthcare Providers:  IncredibleEmployment.be  This test is no t yet approved or cleared by the [place] FDA and  has been authorized for detection and/or diagnosis of SARS-CoV-2 by FDA under  an Emergency Use Authorization (EUA). This EUA will remain  in effect  (meaning this test can be used) for the duration of the COVID-19 declaration under Section 564(b)(1) of the Act, 21 U.S.C.section 360bbb-3(b)(1), unless the authorization is terminated  or revoked sooner.       Influenza A by PCR NEGATIVE NEGATIVE   Influenza B by PCR NEGATIVE NEGATIVE    Comment: (NOTE) The Xpert Xpress SARS-CoV-2/FLU/RSV plus assay is intended as an aid in the diagnosis of influenza from Nasopharyngeal swab specimens and should not be used as a sole basis for treatment. Nasal washings and aspirates are unacceptable for Xpert Xpress SARS-CoV-2/FLU/RSV testing.  Fact Sheet for Patients: EntrepreneurPulse.com.au  Fact Sheet for Healthcare Providers: IncredibleEmployment.be  This test is not yet approved or cleared by the [place] FDA and has been authorized for detection and/or diagnosis of SARS-CoV-2 by FDA under an Emergency Use Authorization (EUA). This EUA will remain in effect (meaning this test can be used) for the duration of the COVID-19 declaration under Section 564(b)(1) of the Act, 21 U.S.C. section 360bbb-3(b)(1), unless the authorization is terminated or revoked.  Performed at [facility], [address] [place], [place] [address]     CT ABDOMEN PELVIS W CONTRAST  Result Date: 02/17/2022 CLINICAL DATA:  Abdominal pain and vomiting. EXAM: CT ABDOMEN AND PELVIS WITH CONTRAST TECHNIQUE: Multidetector CT imaging of the abdomen and pelvis was performed using the standard protocol following bolus administration of intravenous contrast. RADIATION DOSE REDUCTION: This exam was performed according to the departmental dose-optimization program which includes automated exposure control, adjustment of the mA and/or kV according to patient size and/or use of iterative reconstruction technique. CONTRAST:  153m OMNIPAQUE IOHEXOL 300 MG/ML  SOLN COMPARISON:  December 24, 2018 FINDINGS: Lower chest: Stable 5 mm solid noncalcified  lung nodule is seen within the lateral aspect of the right lung base. Hepatobiliary: No focal liver abnormality is seen. Status post cholecystectomy. A distal common bile duct is dilated and measures approximately 0.1 cm. Pancreas: Unremarkable. No pancreatic ductal dilatation or surrounding inflammatory changes. Spleen: Normal in size without focal abnormality. Adrenals/Urinary Tract: No adrenal hemorrhage or renal injury identified. Urinary bladder is poorly distended and subsequently limited evaluation. Overlying streak artifact is also seen. Stomach/Bowel: A 2.2 cm x 1.4 cm x 1.2 cm area of fluid and air, with a thin, hyperdense wall, is seen adjacent to the posteromedial aspect of the duodenal bulb which is markedly inflamed. There is no evidence of bowel dilatation. Noninflamed diverticula are seen throughout the sigmoid colon. Vascular/Lymphatic: No significant vascular findings are present. No enlarged abdominal or pelvic lymph nodes. Reproductive: Status post hysterectomy. No adnexal masses. Other: No abdominal wall hernia or abnormality. No abdominopelvic ascites. 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 There is evidence of prior vertebroplasty at the levels of T12 and L1 with postoperative changes seen at the level of L5-S1. IMPRESSION: 1. Marked severity proximal duodenitis at the level of the duodenal bulb, with additional findings consistent with a contained perforation. Acute gastritis involving the adjacent portion of the gastric antrum cannot be excluded. 2. Sigmoid diverticulosis. 3. Evidence of prior vertebroplasty at the levels of T12 and L1 with postoperative changes seen at the level of L5-S1. Electronically Signed   By: TVirgina NorfolkM.D.   On: 02/17/2022 03:56   DG ABD ACUTE 2+V W 1V CHEST  Result Date: 02/17/2022 CLINICAL DATA:  Abdominal pain and  vomiting. EXAM: DG ABDOMEN ACUTE WITH 1 VIEW CHEST COMPARISON:  Abdominopelvic CT 12/24/2018 FINDINGS: Upper normal heart size. Subsegmental atelectasis at the right lung base and left mid lung. No confluent consolidation. Biapical pleuroparenchymal scarring. No pleural fluid. No bowel dilatation to suggest obstruction. No significant formed stool in the colon. Cholecystectomy clips in the right upper quadrant. Thoraco lumbar vertebral augmentation. Lower lumbar surgical hardware. Right hip arthroplasty. IMPRESSION: 1. Normal bowel gas pattern without evidence of obstruction. 2. Subsegmental atelectasis at the right lung base and left midlung. Electronically Signed   By: Keith Rake M.D.   On: 02/17/2022 01:42    ROS:  Constitutional: positive for chills Gastrointestinal: positive for abdominal pain, change in bowel habits, diarrhea, nausea, and dry heaving  Blood pressure (!) 143/68, pulse 74, temperature 98.1 F (36.7 C), temperature source Oral, resp. rate 15, height '5\' 5"'$  (1.651 m), weight 92.1 kg, SpO2 98 %.  Physical Exam:   General: Uncomfortable appearing. In no acute distress. Oriented to person, place, time.  CV: regular rate and rhythm.  GI: tenderness to palpation in the left and right lower quadrants, worse on the left. Normal bowel sounds present. Soft, non-distended.  No organomegaly.   Assessment/Plan: Patient is  76 year-old female with history of peptic ulcer disease and CML presenting for several weeks of worsening abdominal pain, worse on the lower quadrants, with associated watery diarrhea, nausea, and dry heaving. Physical exam shows lower abdominal tenderness. CT abdomen showed evidence of contained duodenal perforation. Labs show elevated lactate at 2.4, leukocytosis at 13.7. At this time we will closely monitor and continue medical management. Patient is hemodynamically stable. Cover empirically for H. Pylori.   Clydell Hakim 02/17/2022, 9:15 AM

## 2022-02-17 NOTE — Telephone Encounter (Signed)
Hi Mitzie, ? ?Can you please schedule a follow up appointment for this patient in 3 weeks with [facility] (former patient of Dr. Oneida Alar)? Dx: contained perforated ulcer. ? ?Thanks, ? ?Maylon Peppers, MD ?Gastroenterology and Hepatology ?[facility] ? ?

## 2022-02-17 NOTE — Consult Note (Signed)
Primary Care Physician:  Loman Brooklyn, FNP Primary Gastroenterologist:  previously [facility]  Date of Admission: 02/17/22 Date of Consultation: 02/17/22 Reason for Consultation:  perforation  HPI:  Andrea Martinez is a 76 y.o. year old female with pmh of anxiety, CML, gastric ulcer, PUD, hypothryoidism, HLD, osteoporosis, Vitamin D deficiency and anxiety who presented to the ED today with abdominal pain, located in upper abdomen for the past few weeks. Reported diarrhea initially that she was using imodium for, diarrhea improved but she began having nausea and vomiting over the past 2 days with intensification of pain.   ED Course: CBC 13.7, hgb 10.8, lactic acid 2.4  H pylori Ab, IgG in process Resp Panel Negative  VSS Started on zosyn 3.'375mg'$  Q8h  CT A/P with contrast with Marked severity proximal duodenitis at the level of the duodenal bulb, with additional findings consistent with a contained perforation. Acute gastritis involving the adjacent portion of the gastric antrum cannot be excluded.  Consult: States she had some diarrhea with lower abdominal cramping, she was taking imodium and mylanta and still could not get diarrhea to resolve, saw PCP who gave her an antibiotic which she completed, she then developed a bad sinus infection that she required prednisone for. States that diarrhea slowed with the antibiotic but started right back up a few days after. Having 3-4 episodes of diarrhea sometimes per day, then she would have a day where she did not have a BM, would then have 3-4 episodes of diarrhea again with a continuation in the cycle. Was trying to stay well hydrated with gatorade and water, states she was trying to avoid greasy, spicy foods and was still just eat something small as she was afraid diarrhea and cramping would worsen if she ate. She states she did not notice any rectal bleeding or melena. No hematemesis. Patient states that a few weeks ago after the persistent diarrhea,  she started having some epigastric pain from nausea and heaving. She denies any dysphagia, has not had any GERD symptoms. She did notice chills at home but no fever. Also endorses diaphoresis at times, though this is a common side effect of her chemo for CML. She tells me she has had some intermittent burning in her lower abdomen as well, had recent UA done with PCP that was negative. She has had no rectal bleeding or melena. Denies NSAID use at home, only takes tylenol as needed.  Patient currently c/o severe headache, thinks it is from not having slept recently.   Last Colonoscopy:2015 Dr. Pedro Earls to review records Last EGD 12/04/12 erosive gastritis in gastric antrum  Past Medical History:  Diagnosis Date   Anxiety    denies   Arthritis    Chronic myelogenous leukemia (CML), BCR-ABL1-positive ([place]) 11/20/2015   Gastric ulcer 2011   EGD, 5/11   History of peptic ulcer disease    Hypothyroidism    not on meds, followed by Dr. Elyse Hsu   Lipoma    left upper arm   Mixed hyperlipidemia    Osteoporosis    Pericardial effusion    a. HCAP complicated by pericardial effusion requiring pericardial window 10/2016 and large L pleural effusion requring VATS.   Pleural effusion    a. s/p VATS 2017.   Pneumonia 10/2016   PONV (postoperative nausea and vomiting)    history of   Prolonged QT interval    Urine incontinence    UTI (lower urinary tract infection)    frequent   Vitamin D deficiency  Past Surgical History:  Procedure Laterality Date   BRAVO [place] STUDY  12/04/2012   Procedure: BRAVO [place];  Surgeon: [name], MD;  [facility];  Service: Endoscopy;  Laterality: N/A;   CHOLECYSTECTOMY N/A 01/03/2014   Procedure: LAPAROSCOPIC CHOLECYSTECTOMY WITH INTRAOPERATIVE CHOLANGIOGRAM;  Surgeon: Joyice Faster. Cornett, MD;  Location: [place];  Service: General;  Laterality: N/A;   COLONOSCOPY  01/2004   [facility], few small tics    ESOPHAGOGASTRODUODENOSCOPY  02/2010   gastric ulcers   ESOPHAGOGASTRODUODENOSCOPY  12/04/2012   WPY:KDXIPJA gastritis (inflammation) was found in the gastric antrum; multiple biopsies The duodenal mucosa showed no abnormalities in the bulb and second portion of the duodenum   JOINT REPLACEMENT Right 10/22/2019   KYPHOPLASTY N/A 02/14/2019   Procedure: KYPHOPLASTY T12 and L1;  Surgeon: Melina Schools, MD;  Location: [place];  Service: Orthopedics;  Laterality: N/A;  120 mins   LIPOMA EXCISION  08/02/2011   left shoulder   NOSE SURGERY     PARTIAL HYSTERECTOMY     vaginal at age 79 years of age   TOE 110 Left 1962   lt great toe   TOTAL HIP ARTHROPLASTY Right 11/18/2021   Procedure: TOTAL HIP ARTHROPLASTY ANTERIOR APPROACH;  Surgeon: Gaynelle Arabian, MD;  [facility];  Service: Orthopedics;  Laterality: Right;   TOTAL KNEE ARTHROPLASTY Right 10/22/2019   Procedure: TOTAL KNEE ARTHROPLASTY;  Surgeon: Gaynelle Arabian, MD;  [facility];  Service: Orthopedics;  Laterality: Right;  1mn   TOTAL KNEE ARTHROPLASTY Left 03/03/2020   Procedure: TOTAL KNEE ARTHROPLASTY;  Surgeon: AGaynelle Arabian MD;  [facility];  Service: Orthopedics;  Laterality: Left;  533m   TRANSFORAMINAL LUMBAR INTERBODY FUSION (TLIF) WITH PEDICLE SCREW FIXATION 1 LEVEL N/A 01/28/2021   Procedure: TRANSFORAMINAL LUMBAR INTERBODY FUSION (TLIF) LUMBAR FIVE-SACRAL ONE;  Surgeon: BrMelina SchoolsMD;  [place] Service: Orthopedics;  Laterality: N/A;  4 hrs   VIDEO ASSISTED THORACOSCOPY (VATS)/EMPYEMA Left 10/25/2016   Procedure: VIDEO ASSISTED THORACOSCOPY (VATS), BRONCH,DRAINAGE OF PLEURAL EFFUSION,PERICARDIAL WINDOW WITH DRAINAGE OF PERICARDIAL FLUID, TEE;  Surgeon: StMelrose NakayamaMD;  [place] Service: Thoracic;  Laterality: Left;   VIDEO BRONCHOSCOPY N/A 10/25/2016   Procedure: VIDEO BRONCHOSCOPY;  Surgeon: StMelrose NakayamaMD;  [facility] Service: Thoracic;  Laterality: N/A;     Prior to Admission medications   Medication Sig Start Date End Date Taking? Authorizing Provider  acetaminophen (TYLENOL) 500 MG tablet Take 1,000 mg by mouth every 8 (eight) hours as needed for moderate pain.   Yes [provider]  cetirizine (ZYRTEC) 10 MG tablet Take 10 mg by mouth daily.   Yes [provider]  escitalopram (LEXAPRO) 20 MG tablet TAKE 1 AND 1/2 TABLETS BY MOUTH DAILY 12/21/21  Yes JoHendricks Limes, FNP  levothyroxine (SYNTHROID) 112 MCG tablet Take 1 tablet (112 mcg total) by mouth daily before breakfast. 10/05/21  Yes Nida, GeMarella ChimesMD  amoxicillin-clavulanate (AUGMENTIN) 875-125 MG tablet Take 1 tablet by mouth 2 (two) times daily. Patient not taking: Reported on 02/17/2022 01/26/22   MaChevis PrettyFNP  Cholecalciferol (VITAMIN D3) 50 MCG (2000 UT) TABS Take 2,000 Units by mouth daily in the afternoon.    [provider]  dicyclomine (BENTYL) 10 MG capsule Take 1 capsule (10 mg total) by mouth 4 (four) times daily -  before meals and at bedtime. Patient not taking: Reported on 02/17/2022 02/11/22   JoLoman Brooklyn  FNP  lactose free nutrition (BOOST) LIQD Take 237 mLs by mouth daily.    [provider]  Multiple Vitamins-Minerals (CENTRUM SILVER PO) Take 1 tablet by mouth daily in the afternoon.    [provider]  nilotinib (TASIGNA) 150 MG capsule Take 2 capsules (300 mg total) by mouth every 12 (twelve) hours. 11/16/21   Derek Jack, MD  Probiotic Product (PROBIOTIC DAILY PO) Take 2 tablets by mouth daily in the afternoon.    [provider]  sucralfate (CARAFATE) 1 GM/10ML suspension Take 10 mLs (1 g total) by mouth 4 (four) times daily -  with meals and at bedtime. Patient not taking: Reported on 02/17/2022 02/11/22   Loman Brooklyn, FNP    Current Facility-Administered Medications  Medication Dose Route Frequency Provider Last Rate Last Admin   0.9 %  sodium chloride infusion   Intravenous  Continuous Zierle-Ghosh, Asia B, DO 75 mL/hr at 02/17/22 0513 New Bag at 02/17/22 0513   acetaminophen (TYLENOL) tablet 650 mg  650 mg Oral Q6H PRN Zierle-Ghosh, Asia B, DO       Or   acetaminophen (TYLENOL) suppository 650 mg  650 mg Rectal Q6H PRN Zierle-Ghosh, Asia B, DO       escitalopram (LEXAPRO) tablet 30 mg  30 mg Oral Daily Zierle-Ghosh, Asia B, DO       heparin injection 5,000 Units  5,000 Units Subcutaneous Q8H Zierle-Ghosh, Asia B, DO   5,000 Units at 02/17/22 0516   levothyroxine (SYNTHROID) tablet 112 mcg  112 mcg Oral QAC breakfast Zierle-Ghosh, Asia B, DO   112 mcg at 02/17/22 0521   LORazepam (ATIVAN) injection 0.5 mg  0.5 mg Intravenous Q6H PRN Zierle-Ghosh, Asia B, DO       morphine (PF) 2 MG/ML injection 2 mg  2 mg Intravenous Q2H PRN Zierle-Ghosh, Asia B, DO   2 mg at 02/17/22 0754   ondansetron (ZOFRAN) tablet 4 mg  4 mg Oral Q6H PRN Zierle-Ghosh, Asia B, DO       Or   ondansetron (ZOFRAN) injection 4 mg  4 mg Intravenous Q6H PRN Zierle-Ghosh, Asia B, DO       oxyCODONE (Oxy IR/ROXICODONE) immediate release tablet 5 mg  5 mg Oral Q4H PRN Zierle-Ghosh, Asia B, DO       [START ON 02/20/2022] pantoprazole (PROTONIX) injection 40 mg  40 mg Intravenous Q12H Zierle-Ghosh, Asia B, DO       piperacillin-tazobactam (ZOSYN) IVPB 3.375 g  3.375 g Intravenous Q8H Zierle-Ghosh, Asia B, DO       Current Outpatient Medications  Medication Sig Dispense Refill   acetaminophen (TYLENOL) 500 MG tablet Take 1,000 mg by mouth every 8 (eight) hours as needed for moderate pain.     cetirizine (ZYRTEC) 10 MG tablet Take 10 mg by mouth daily.     escitalopram (LEXAPRO) 20 MG tablet TAKE 1 AND 1/2 TABLETS BY MOUTH DAILY 135 tablet 0   levothyroxine (SYNTHROID) 112 MCG tablet Take 1 tablet (112 mcg total) by mouth daily before breakfast. 90 tablet 3   amoxicillin-clavulanate (AUGMENTIN) 875-125 MG tablet Take 1 tablet by mouth 2 (two) times daily. (Patient not taking: Reported on 02/17/2022) 14 tablet  0   Cholecalciferol (VITAMIN D3) 50 MCG (2000 UT) TABS Take 2,000 Units by mouth daily in the afternoon.     dicyclomine (BENTYL) 10 MG capsule Take 1 capsule (10 mg total) by mouth 4 (four) times daily -  before meals and at bedtime. (Patient not taking: Reported  on 02/17/2022) 120 capsule 2   lactose free nutrition (BOOST) LIQD Take 237 mLs by mouth daily.     Multiple Vitamins-Minerals (CENTRUM SILVER PO) Take 1 tablet by mouth daily in the afternoon.     nilotinib (TASIGNA) 150 MG capsule Take 2 capsules (300 mg total) by mouth every 12 (twelve) hours. 120 capsule 6   Probiotic Product (PROBIOTIC DAILY PO) Take 2 tablets by mouth daily in the afternoon.     sucralfate (CARAFATE) 1 GM/10ML suspension Take 10 mLs (1 g total) by mouth 4 (four) times daily -  with meals and at bedtime. (Patient not taking: Reported on 02/17/2022) 414 mL 0    Allergies as of 02/17/2022 - Review Complete 02/17/2022  Allergen Reaction Noted   Nucynta [tapentadol hcl] Swelling 12/31/2020   Codeine Nausea Only and Other (See Comments) 02/11/2010   Sulfonamide derivatives Nausea Only and Other (See Comments) 02/11/2010   Esomeprazole magnesium Nausea Only 05/20/2010   Fluconazole Rash 12/10/2015    Family History  Problem Relation Age of Onset   Lung cancer Father    COPD Mother    Stroke Mother    Thyroid disease Mother    Colon cancer Neg Hx    Anesthesia problems Neg Hx    Hypotension Neg Hx    Malignant hyperthermia Neg Hx    Pseudochol deficiency Neg Hx     Social History   Socioeconomic History   Marital status: Married    Spouse name: Not on file   Number of children: 2   Years of education: 12th grade   Highest education level: Not on file  Occupational History   Occupation: retired    Fish farm manager: UENPLOYED    Employer: RETIRED  Tobacco Use   Smoking status: Never   Smokeless tobacco: Never  Vaping Use   Vaping Use: Never used  Substance and Sexual Activity   Alcohol use: No     Alcohol/week: 0.0 standard drinks   Drug use: No   Sexual activity: Yes    Partners: Male    Birth control/protection: None  Other Topics Concern   Not on file  Social History Narrative   Not on file   Social Determinants of Health   Financial Resource Strain: Low Risk    Difficulty of Paying Living Expenses: Not hard at all  Food Insecurity: No Food Insecurity   Worried About Charity fundraiser in the Last Year: Never true   Arboriculturist in the Last Year: Never true  Transportation Needs: No Transportation Needs   Lack of Transportation (Medical): No   Lack of Transportation (Non-Medical): No  Physical Activity: Sufficiently Active   Days of Exercise per Week: 7 days   Minutes of Exercise per Session: 30 min  Stress: No Stress Concern Present   Feeling of Stress : Only a little  Social Connections: Engineer, building services of Communication with Friends and Family: More than three times a week   Frequency of Social Gatherings with Friends and Family: More than three times a week   Attends Religious Services: More than 4 times per year   Active Member of [employer] or Organizations: Yes   Attends Archivist Meetings: 1 to 4 times per year   Marital Status: Married  Human resources officer Violence: Not on file   Review of Systems: Gen: Denies fever, loss of appetite, change in weight or weight loss +chills CV: Denies chest pain, heart palpitations, syncope, edema  Resp: Denies shortness  of breath with rest, cough, wheezing GI: Denies dysphagia or odynophagia. Denies vomiting blood, jaundice, and fecal incontinence. +nausea +vomiting +diarrhea +epigastric pain +generalized abdominal pain/burning in lower abdomen GU : Denies urinary burning, urinary frequency, urinary incontinence.  MS: Denies joint pain,swelling, cramping Derm: Denies rash, itching, dry skin Psych: Denies depression, anxiety,confusion, or memory loss Heme: Denies bruising, bleeding, and enlarged  lymph nodes.  Physical Exam: Vital signs in last 24 hours: Temp:  [98.1 F (36.7 C)] 98.1 F (36.7 C) (03/08 0107) Pulse Rate:  [67-110] 74 (03/08 0900) Resp:  [15-26] 15 (03/08 0900) BP: (132-160)/(48-91) 143/68 (03/08 0900) SpO2:  [93 %-100 %] 98 % (03/08 0900) Weight:  [92.1 kg] 92.1 kg (03/08 0106)   General:   Alert,  Well-developed, well-nourished, pleasant and cooperative in NAD Head:  Normocephalic and atraumatic. Eyes:  Sclera clear, no icterus.   Conjunctiva pink. Ears:  Normal auditory acuity. Nose:  No deformity, discharge,  or lesions. Mouth:  No deformity or lesions, dentition normal. Neck:  Supple; no masses or thyromegaly. Lungs:  Clear throughout to auscultation.   No wheezes, crackles, or rhonchi. No acute distress. Heart:  Regular rate and rhythm; no murmurs, clicks, rubs,  or gallops. Abdomen:  Soft, nontender and nondistended. No masses, hepatosplenomegaly or hernias noted. Normal bowel sounds, without guarding, and without rebound.   Rectal:  Deferred until time of colonoscopy.   Msk:  Symmetrical without gross deformities. Normal posture. Pulses:  Normal pulses noted. Extremities:  Without clubbing or edema. Neurologic:  Alert and  oriented x4;  grossly normal neurologically. Skin:  Intact without significant lesions or rashes. Cervical Nodes:  No significant cervical adenopathy. Psych:  Alert and cooperative. Normal mood and affect.  Intake/Output from previous day: No intake/output data recorded. Intake/Output this shift: No intake/output data recorded.  Lab Results: Recent Labs    02/17/22 0145  WBC 13.7*  HGB 10.8*  HCT 35.5*  PLT 338   BMET Recent Labs    02/17/22 0145  NA 136  K 4.7  CL 103  CO2 23  GLUCOSE 143*  BUN 23  CREATININE 0.97  CALCIUM 8.8*   LFT Recent Labs    02/17/22 0145  PROT 7.4  ALBUMIN 3.7  AST 25  ALT 19  ALKPHOS 126  BILITOT 0.4   PT/INR No results for input(s): LABPROT, INR in the last 72  hours. Hepatitis Panel No results for input(s): HEPBSAG, HCVAB, HEPAIGM, HEPBIGM in the last 72 hours. C-Diff No results for input(s): CDIFFTOX in the last 72 hours.  Studies/Results: CT ABDOMEN PELVIS W CONTRAST  Result Date: 02/17/2022 CLINICAL DATA:  Abdominal pain and vomiting. EXAM: CT ABDOMEN AND PELVIS WITH CONTRAST TECHNIQUE: Multidetector CT imaging of the abdomen and pelvis was performed using the standard protocol following bolus administration of intravenous contrast. RADIATION DOSE REDUCTION: This exam was performed according to the departmental dose-optimization program which includes automated exposure control, adjustment of the mA and/or kV according to patient size and/or use of iterative reconstruction technique. CONTRAST:  174m OMNIPAQUE IOHEXOL 300 MG/ML  SOLN COMPARISON:  December 24, 2018 FINDINGS: Lower chest: Stable 5 mm solid noncalcified lung nodule is seen within the lateral aspect of the right lung base. Hepatobiliary: No focal liver abnormality is seen. Status post cholecystectomy. A distal common bile duct is dilated and measures approximately 0.1 cm. Pancreas: Unremarkable. No pancreatic ductal dilatation or surrounding inflammatory changes. Spleen: Normal in size without focal abnormality. Adrenals/Urinary Tract: No adrenal hemorrhage or renal injury identified. Urinary bladder is  poorly distended and subsequently limited evaluation. Overlying streak artifact is also seen. Stomach/Bowel: A 2.2 cm x 1.4 cm x 1.2 cm area of fluid and air, with a thin, hyperdense wall, is seen adjacent to the posteromedial aspect of the duodenal bulb which is markedly inflamed. There is no evidence of bowel dilatation. Noninflamed diverticula are seen throughout the sigmoid colon. Vascular/Lymphatic: No significant vascular findings are present. No enlarged abdominal or pelvic lymph nodes. Reproductive: Status post hysterectomy. No adnexal masses. Other: No abdominal wall hernia or abnormality.  No abdominopelvic ascites. 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 There is evidence of prior vertebroplasty at the levels of T12 and L1 with postoperative changes seen at the level of L5-S1. IMPRESSION: 1. Marked severity proximal duodenitis at the level of the duodenal bulb, with additional findings consistent with a contained perforation. Acute gastritis involving the adjacent portion of the gastric antrum cannot be excluded. 2. Sigmoid diverticulosis. 3. Evidence of prior vertebroplasty at the levels of T12 and L1 with postoperative changes seen at the level of L5-S1. Electronically Signed   By: Virgina Norfolk M.D.   On: 02/17/2022 03:56   DG ABD ACUTE 2+V W 1V CHEST  Result Date: 02/17/2022 CLINICAL DATA:  Abdominal pain and vomiting. EXAM: DG ABDOMEN ACUTE WITH 1 VIEW CHEST COMPARISON:  Abdominopelvic CT 12/24/2018 FINDINGS: Upper normal heart size. Subsegmental atelectasis at the right lung base and left mid lung. No confluent consolidation. Biapical pleuroparenchymal scarring. No pleural fluid. No bowel dilatation to suggest obstruction. No significant formed stool in the colon. Cholecystectomy clips in the right upper quadrant. Thoraco lumbar vertebral augmentation. Lower lumbar surgical hardware. Right hip arthroplasty. IMPRESSION: 1. Normal bowel gas pattern without evidence of obstruction. 2. Subsegmental atelectasis at the right lung base and left midlung. Electronically Signed   By: Keith Rake M.D.   On: 02/17/2022 01:42    Impression: Andrea Martinez is a 76 y.o. year old female with pmh of anxiety, CML, gastric ulcer, PUD, hypothryoidism, HLD, osteoporosis, Vitamin D deficiency and anxiety who presented to the ED today with abdominal pain, located in upper abdomen for the past few weeks. Reported diarrhea initially that she was using imodium for, diarrhea  improved but she began having nausea and vomiting over the past 2 days with intensification of pain.   Duodenitis with contained duodenal perforation: as noted on CT A/P with contrast. Symptoms began with diarrhea for which she was treated on outpatient basis with cipro '50mg'$  BID x 10 days with some improvement but not resolution. She then began having frequent diarrhea again, recently onset of nausea and vomiting as well as epigastric pain. Denies fevers but has had chills. No melena or rectal bleeding.  Started on zosyn 3.'375mg'$  q8H. Will need total of 14 day course of antibiotics. Recommend EGD in 3 months. Continue supportive measures at this time and PPI BID. H pylori serology pending.   Plan: Complete 14 day total course of antibiotics (transition to flagyl and cipro at discharge) Follow up with GI 2-3 weeks after discharge EGD in 3 months Anti emetics and pain management via hospitalist  Protonix '40mg'$  BID IV  Appreciate general surgery consult/further recommendations Continue supportive measures Avoid NSAIDs  GI will likely sign off   LOS: 0 days    02/17/2022, 10:24 AM  Zalan Shidler L. Alver Sorrow, MSN, APRN, AGNP-C Adult-Gerontology Nurse Practitioner [facility]

## 2022-02-17 NOTE — Telephone Encounter (Signed)
Looks like she was admitted to the hospital today for these symptoms. I will not make any changes at this time.  ?

## 2022-02-18 DIAGNOSIS — K631 Perforation of intestine (nontraumatic): Secondary | ICD-10-CM | POA: Diagnosis not present

## 2022-02-18 LAB — CBC WITH DIFFERENTIAL/PLATELET
Abs Immature Granulocytes: 0.16 10*3/uL — ABNORMAL HIGH (ref 0.00–0.07)
Basophils Absolute: 0 10*3/uL (ref 0.0–0.1)
Basophils Relative: 0 %
Eosinophils Absolute: 0.2 10*3/uL (ref 0.0–0.5)
Eosinophils Relative: 1 %
HCT: 33.9 % — ABNORMAL LOW (ref 36.0–46.0)
Hemoglobin: 10.1 g/dL — ABNORMAL LOW (ref 12.0–15.0)
Immature Granulocytes: 1 %
Lymphocytes Relative: 7 %
Lymphs Abs: 1 10*3/uL (ref 0.7–4.0)
MCH: 26.6 pg (ref 26.0–34.0)
MCHC: 29.8 g/dL — ABNORMAL LOW (ref 30.0–36.0)
MCV: 89.2 fL (ref 80.0–100.0)
Monocytes Absolute: 1.2 10*3/uL — ABNORMAL HIGH (ref 0.1–1.0)
Monocytes Relative: 8 %
Neutro Abs: 11.8 10*3/uL — ABNORMAL HIGH (ref 1.7–7.7)
Neutrophils Relative %: 83 %
Platelets: 306 10*3/uL (ref 150–400)
RBC: 3.8 MIL/uL — ABNORMAL LOW (ref 3.87–5.11)
RDW: 14.3 % (ref 11.5–15.5)
WBC: 14.3 10*3/uL — ABNORMAL HIGH (ref 4.0–10.5)
nRBC: 0 % (ref 0.0–0.2)

## 2022-02-18 LAB — COMPREHENSIVE METABOLIC PANEL
ALT: 37 U/L (ref 0–44)
AST: 48 U/L — ABNORMAL HIGH (ref 15–41)
Albumin: 3.3 g/dL — ABNORMAL LOW (ref 3.5–5.0)
Alkaline Phosphatase: 141 U/L — ABNORMAL HIGH (ref 38–126)
Anion gap: 10 (ref 5–15)
BUN: 19 mg/dL (ref 8–23)
CO2: 19 mmol/L — ABNORMAL LOW (ref 22–32)
Calcium: 8.2 mg/dL — ABNORMAL LOW (ref 8.9–10.3)
Chloride: 108 mmol/L (ref 98–111)
Creatinine, Ser: 1.08 mg/dL — ABNORMAL HIGH (ref 0.44–1.00)
GFR, Estimated: 53 mL/min — ABNORMAL LOW (ref >60)
Glucose, Bld: 106 mg/dL — ABNORMAL HIGH (ref 70–99)
Potassium: 4.7 mmol/L (ref 3.5–5.1)
Sodium: 137 mmol/L (ref 135–145)
Total Bilirubin: 0.8 mg/dL (ref 0.3–1.2)
Total Protein: 6.6 g/dL (ref 6.5–8.1)

## 2022-02-18 LAB — H. PYLORI ANTIBODY, IGG: H Pylori IgG: 0.06 Index Value (ref 0.00–0.79)

## 2022-02-18 LAB — LACTIC ACID, PLASMA: Lactic Acid, Venous: 0.8 mmol/L (ref 0.5–1.9)

## 2022-02-18 LAB — MAGNESIUM: Magnesium: 2.3 mg/dL (ref 1.7–2.4)

## 2022-02-18 MED ORDER — PANTOPRAZOLE 80MG IVPB - SIMPLE MED
80.0000 mg | Freq: Once | INTRAVENOUS | Status: AC
  Administered 2022-02-18: 09:00:00 80 mg via INTRAVENOUS
  Filled 2022-02-18: qty 100

## 2022-02-18 MED ORDER — SIMETHICONE 80 MG PO CHEW
80.0000 mg | CHEWABLE_TABLET | Freq: Once | ORAL | Status: AC
  Administered 2022-02-18: 23:00:00 80 mg via ORAL
  Filled 2022-02-18: qty 1

## 2022-02-18 MED ORDER — PANTOPRAZOLE SODIUM 40 MG IV SOLR
40.0000 mg | Freq: Two times a day (BID) | INTRAVENOUS | Status: DC
  Filled 2022-02-18: qty 10

## 2022-02-18 MED ORDER — PANTOPRAZOLE INFUSION (NEW) - SIMPLE MED
8.0000 mg/h | INTRAVENOUS | Status: AC
  Administered 2022-02-18 – 2022-02-20 (×4): 8 mg/h via INTRAVENOUS
  Filled 2022-02-18 (×5): qty 100
  Filled 2022-02-18 (×2): qty 80
  Filled 2022-02-18: qty 100
  Filled 2022-02-18: qty 80

## 2022-02-18 NOTE — Progress Notes (Signed)
[facility] Progress Note ? ?   ?Subjective: ?Patient is a 76 y.o. female who was admitted for duodenitis with contained duodenal perforation. On morning rounds today patient reports improvement in her abdominal pain but continues to have some soreness in her lower abdominal region as well as some burning sensation in her epigastric region. She also reports some fever and chills last night that continues to improve this morning. Patient states she feels week as she has not had anything to eat since yesterday. She denies nausea, vomiting, or diarrhea.  ? ?Objective: ?Vital signs in last 24 hours: ?Temp:  [98.1 ?F (36.7 ?C)-100.7 ?F (38.2 ?C)] 99.3 ?F (37.4 ?C) (03/09 0543) ?Pulse Rate:  [67-106] 97 (03/09 0543) ?Resp:  [13-26] 18 (03/09 0543) ?BP: (116-143)/(45-68) 118/55 (03/09 0543) ?SpO2:  [93 %-100 %] 95 % (03/09 0543) ?  ? ?Intake/Output from previous day: ?03/08 0701 - 03/09 0700 ?In: 2637 [I.V.:1500; IV Piggyback:50] ?Out: -  ?Intake/Output this shift: ?No intake/output data recorded. ? ?General appearance: alert, cooperative, no distress, and uncomfortable appearing ?GI: Tenderness in the lower abdominal quadrants, left more than right. Normal bowel sounds. Soft abdomen with no distension. No organomegaly.  ? ?Lab Results:  ?Recent Labs  ?  02/17/22 ?0145 02/18/22 ?0552  ?WBC 13.7* 14.3*  ?HGB 10.8* 10.1*  ?HCT 35.5* 33.9*  ?PLT 338 306  ? ?BMET ?Recent Labs  ?  02/17/22 ?0145 02/18/22 ?0552  ?NA 136 137  ?K 4.7 4.7  ?CL 103 108  ?CO2 23 19*  ?GLUCOSE 143* 106*  ?BUN 23 19  ?CREATININE 0.97 1.08*  ?CALCIUM 8.8* 8.2*  ? ?PT/INR ?No results for input(s): LABPROT, INR in the last 72 hours. ? ?Studies/Results: ?CT ABDOMEN PELVIS W CONTRAST ? ?Result Date: 02/17/2022 ?CLINICAL DATA:  Abdominal pain and vomiting. EXAM: CT ABDOMEN AND PELVIS WITH CONTRAST TECHNIQUE: Multidetector CT imaging of the abdomen and pelvis was performed using the standard protocol following bolus administration of  intravenous contrast. RADIATION DOSE REDUCTION: This exam was performed according to the departmental dose-optimization program which includes automated exposure control, adjustment of the mA and/or kV according to patient size and/or use of iterative reconstruction technique. CONTRAST:  146m OMNIPAQUE IOHEXOL 300 MG/ML  SOLN COMPARISON:  December 24, 2018 FINDINGS: Lower chest: Stable 5 mm solid noncalcified lung nodule is seen within the lateral aspect of the right lung base. Hepatobiliary: No focal liver abnormality is seen. Status post cholecystectomy. A distal common bile duct is dilated and measures approximately 0.1 cm. Pancreas: Unremarkable. No pancreatic ductal dilatation or surrounding inflammatory changes. Spleen: Normal in size without focal abnormality. Adrenals/Urinary Tract: No adrenal hemorrhage or renal injury identified. Urinary bladder is poorly distended and subsequently limited evaluation. Overlying streak artifact is also seen. Stomach/Bowel: A 2.2 cm x 1.4 cm x 1.2 cm area of fluid and air, with a thin, hyperdense wall, is seen adjacent to the posteromedial aspect of the duodenal bulb which is markedly inflamed. There is no evidence of bowel dilatation. Noninflamed diverticula are seen throughout the sigmoid colon. Vascular/Lymphatic: No significant vascular findings are present. No enlarged abdominal or pelvic lymph nodes. Reproductive: Status post hysterectomy. No adnexal masses. Other: No abdominal wall hernia or abnormality. No abdominopelvic ascites. 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 There is evidence of prior vertebroplasty at the levels of T12 and L1 with postoperative changes seen at the level of L5-S1. IMPRESSION: 1. Marked severity  proximal duodenitis at the level of the duodenal bulb, with additional findings consistent with a contained  perforation. Acute gastritis involving the adjacent portion of the gastric antrum cannot be excluded. 2. Sigmoid diverticulosis. 3. Evidence of prior vertebroplasty at the levels of T12 and L1 with postoperative changes seen at the level of L5-S1. Electronically Signed   By: Virgina Norfolk M.D.   On: 02/17/2022 03:56  ? ?DG ABD ACUTE 2+V W 1V CHEST ? ?Result Date: 02/17/2022 ?CLINICAL DATA:  Abdominal pain and vomiting. EXAM: DG ABDOMEN ACUTE WITH 1 VIEW CHEST COMPARISON:  Abdominopelvic CT 12/24/2018 FINDINGS: Upper normal heart size. Subsegmental atelectasis at the right lung base and left mid lung. No confluent consolidation. Biapical pleuroparenchymal scarring. No pleural fluid. No bowel dilatation to suggest obstruction. No significant formed stool in the colon. Cholecystectomy clips in the right upper quadrant. Thoraco lumbar vertebral augmentation. Lower lumbar surgical hardware. Right hip arthroplasty. IMPRESSION: 1. Normal bowel gas pattern without evidence of obstruction. 2. Subsegmental atelectasis at the right lung base and left midlung. Electronically Signed   By: Keith Rake M.D.   On: 02/17/2022 01:42   ? ?Anti-infectives: ?Anti-infectives (From admission, onward)  ? ? Start     Dose/Rate Route Frequency Ordered Stop  ? 02/17/22 1200  piperacillin-tazobactam (ZOSYN) IVPB 3.375 g  Status:  Discontinued       ? 3.375 g ?100 mL/hr over 30 Minutes Intravenous Every 8 hours 02/17/22 0458 02/17/22 0502  ? 02/17/22 1200  piperacillin-tazobactam (ZOSYN) IVPB 3.375 g       ? 3.375 g ?12.5 mL/hr over 240 Minutes Intravenous Every 8 hours 02/17/22 0502    ? 02/17/22 0415  piperacillin-tazobactam (ZOSYN) IVPB 3.375 g       ? 3.375 g ?100 mL/hr over 30 Minutes Intravenous  Once 02/17/22 0405 02/17/22 0450  ? ?  ? ? ?Assessment/Plan: ?Patient is a 76 y.o. female with PMH significant for peptic ulcer disease and CML who was admitted for contained duodenal perforation.  ? ?-She continues to have some abdominal  tenderness with some improvements in her pain.  ?-Since yesterday, patient has been started on PPI regimen as well as Zosyn. Pending H. Hypori serology.  ?-Patient has been on NPO status to allow for bowel rest. Will advance diet to clears if patient is able to tolerate.  ?-Last night patient had temperature up to 100.7 F that has now improved to 99.3 F.  ?-Lactic acid has improved to 0.8 from 2.4 yesterday. Leukocytosis at 14.3, increased from 13.7 yesterday.  ?-Continue medical management with PPI, antibiotics, and IV fluids.  ? ? LOS: 1 day  ? ? ?Clydell Hakim ?02/18/2022 ? ? ?

## 2022-02-18 NOTE — Progress Notes (Signed)
PROGRESS NOTE    Andrea Martinez  MPN:[id number] DOB: [birth year] DOA: 02/17/2022 PCP: Loman Brooklyn, FNP   Brief Narrative:  At thePer HPI: Andrea Martinez is a 76 y.o. female with medical history significant of with history of IBS, anxiety, CML, gastric ulcer, hypothyroidism, hyperlipidemia, and more presents ED with a chief complaint of abdominal pain.  Patient reports that abdominal pain has been cramping in her lower abdomen and a cramping and burning in her upper abdomen.  This started 3 weeks ago with gradual onset.  Today it is severe.  She has associated nausea and vomiting.  She has not been eating so the vomit appears as white frothy.  She has not seen any hematemesis or coffee-ground emesis.  She has had diarrhea up to 3 times per day.  No bloody stool or melena.  She has had a decreased appetite.  He has been attempting to control her symptoms with Imodium at home and Mylanta.  They have not helped.  She reports she was on an antibiotic for this, but the last antibiotic in the system was Augmentin-and she reports that was on for sinus infection.  Patient reports that her pain is much worse when she stands straight, better when she doubles over.  She has had a lot of GI issues before, but she has not had pain like this before.  Patient has no other complaints at this time.   02/17/22: Patient was admitted for contained perforation of and is currently n.p.o. on IV fluid with Zosyn ongoing.  Appreciate general surgery consultation with no need for operative intervention at this time.  GI evaluation pending.  02/18/22: GI has recommended initiation of PPI infusion over the next 72 hours transition to twice daily PPI on discharge.  She continues to remain n.p.o. at this time with plans for dietary advancement per general surgery.  Continue IV Zosyn.  H. pylori serologies pending.  Lactic acidosis has improved.    Assessment & Plan:   Principal Problem:   Perforation of duodenum (HCC) Active  Problems:   Hypothyroidism   Nausea and vomiting   EPIGASTRIC PAIN   Anxiety   Duodenitis   Diarrhea  Assessment and Plan: * Perforation of duodenum (HCC) Contained perf associated with duodenitis NPO for now until further dietary advancement recommendations from general surgery Continue zosyn   Duodenitis Protonix bolus in ED Continue protonix IV drip as recommended by GI Appreciate ongoing general surgery recommendations for dietary advancement GI to follow-up outpatient in 2-3 weeks Pain control with pain scale NPO Maintenance fluids Continue to monitor  Anxiety Continue lexapro PRN ativan  Hypothyroidism Continue synthroid   DVT prophylaxis: Heparin Code Status: Full Family Communication: None at bedside Disposition Plan:  Status is: Inpatient Remains inpatient appropriate because: Need for IV medications.  Consultants:  GI General surgery  Procedures:  See below  Antimicrobials:  Anti-infectives (From admission, onward)    Start     Dose/Rate Route Frequency Ordered Stop   02/17/22 1200  piperacillin-tazobactam (ZOSYN) IVPB 3.375 g  Status:  Discontinued        3.375 g 100 mL/hr over 30 Minutes Intravenous Every 8 hours 02/17/22 0458 02/17/22 0502   02/17/22 1200  piperacillin-tazobactam (ZOSYN) IVPB 3.375 g        3.375 g 12.5 mL/hr over 240 Minutes Intravenous Every 8 hours 02/17/22 0502     02/17/22 0415  piperacillin-tazobactam (ZOSYN) IVPB 3.375 g        3.375 g 100 mL/hr over  30 Minutes Intravenous  Once 02/17/22 0405 02/17/22 0450       Subjective: Patient seen and evaluated today with no new acute complaints or concerns. No acute concerns or events noted overnight.  She states that her abdominal discomfort has improved.  She was noted to have some chills and diaphoresis last night.  She has some ongoing generalized weakness.  No nausea or vomiting noted.  She does not report any bowel movements.  Objective: Vitals:   02/17/22 2014  02/17/22 2300 02/18/22 0427 02/18/22 0543  BP: (!) 131/46 (!) 134/50 (!) 116/49 (!) 118/55  Pulse: 77 79 (!) 106 97  Resp: '18 18 18 18  '$ Temp: 98.7 F (37.1 C) 99.1 F (37.3 C) (!) 100.7 F (38.2 C) 99.3 F (37.4 C)  TempSrc: Oral Oral Oral Oral  SpO2: 93% 94% 94% 95%  Weight:      Height:        Intake/Output Summary (Last 24 hours) at 02/18/2022 1216 Last data filed at 02/18/2022 0930 Gross per 24 hour  Intake 2293.47 ml  Output 350 ml  Net 1943.47 ml   Filed Weights   02/17/22 0106  Weight: 92.1 kg    Examination:  General exam: Appears calm and comfortable  Respiratory system: Clear to auscultation. Respiratory effort normal. Cardiovascular system: S1 & S2 heard, RRR.  Gastrointestinal system: Abdomen is soft, generally tender to palpation Central nervous system: Alert and awake Extremities: No edema Skin: No significant lesions noted Psychiatry: Flat affect.    Data Reviewed: I have personally reviewed following labs and imaging studies  CBC: Recent Labs  Lab 02/17/22 0145 02/18/22 0552  WBC 13.7* 14.3*  NEUTROABS 11.8* 11.8*  HGB 10.8* 10.1*  HCT 35.5* 33.9*  MCV 87.2 89.2  PLT 338 497   Basic Metabolic Panel: Recent Labs  Lab 02/17/22 0145 02/18/22 0552  NA 136 137  K 4.7 4.7  CL 103 108  CO2 23 19*  GLUCOSE 143* 106*  BUN 23 19  CREATININE 0.97 1.08*  CALCIUM 8.8* 8.2*  MG 2.2 2.3   GFR: Estimated Creatinine Clearance: 48.9 mL/min (A) (by C-G formula based on SCr of 1.08 mg/dL (H)). Liver Function Tests: Recent Labs  Lab 02/17/22 0145 02/18/22 0552  AST 25 48*  ALT 19 37  ALKPHOS 126 141*  BILITOT 0.4 0.8  PROT 7.4 6.6  ALBUMIN 3.7 3.3*   Recent Labs  Lab 02/17/22 0145  LIPASE 31   No results for input(s): AMMONIA in the last 168 hours. Coagulation Profile: No results for input(s): INR, PROTIME in the last 168 hours. Cardiac Enzymes: No results for input(s): CKTOTAL, CKMB, CKMBINDEX, TROPONINI in the last 168 hours. BNP  (last 3 results) No results for input(s): PROBNP in the last 8760 hours. HbA1C: No results for input(s): HGBA1C in the last 72 hours. CBG: No results for input(s): GLUCAP in the last 168 hours. Lipid Profile: No results for input(s): CHOL, HDL, LDLCALC, TRIG, CHOLHDL, LDLDIRECT in the last 72 hours. Thyroid Function Tests: No results for input(s): TSH, T4TOTAL, FREET4, T3FREE, THYROIDAB in the last 72 hours. Anemia Panel: No results for input(s): VITAMINB12, FOLATE, FERRITIN, TIBC, IRON, RETICCTPCT in the last 72 hours. Sepsis Labs: Recent Labs  Lab 02/17/22 0145 02/18/22 0552  LATICACIDVEN 2.4* 0.8    Recent Results (from the past 240 hour(s))  Microscopic Examination     Status: Abnormal   Collection Time: 02/11/22 10:35 AM   Urine  Result Value Ref Range Status   WBC, UA 0-5  0 - 5 /hpf Final   RBC None seen 0 - 2 /hpf Final   Epithelial Cells (non renal) 0-10 0 - 10 /hpf Final   Renal Epithel, UA None seen None seen /hpf Final   Bacteria, UA Few (A) None seen/Few Final  Resp Panel by RT-PCR (Flu A&B, Covid) Nasopharyngeal Swab     Status: None   Collection Time: 02/17/22  4:22 AM   Specimen: Nasopharyngeal Swab; Nasopharyngeal(NP) swabs in vial transport medium  Result Value Ref Range Status   SARS Coronavirus 2 by RT PCR NEGATIVE NEGATIVE Final    Comment: (NOTE) SARS-CoV-2 target nucleic acids are NOT DETECTED.  The SARS-CoV-2 RNA is generally detectable in upper respiratory specimens during the acute phase of infection. The lowest concentration of SARS-CoV-2 viral copies this assay can detect is 138 copies/mL. A negative result does not preclude SARS-Cov-2 infection and should not be used as the sole basis for treatment or other patient management decisions. A negative result may occur with  improper specimen collection/handling, submission of specimen other than nasopharyngeal swab, presence of viral mutation(s) within the areas targeted by this assay, and  inadequate number of viral copies(<138 copies/mL). A negative result must be combined with clinical observations, patient history, and epidemiological information. The expected result is Negative.  Fact Sheet for Patients:  EntrepreneurPulse.com.au  Fact Sheet for Healthcare Providers:  IncredibleEmployment.be  This test is no t yet approved or cleared by the [place] FDA and  has been authorized for detection and/or diagnosis of SARS-CoV-2 by FDA under an Emergency Use Authorization (EUA). This EUA will remain  in effect (meaning this test can be used) for the duration of the COVID-19 declaration under Section 564(b)(1) of the Act, 21 U.S.C.section 360bbb-3(b)(1), unless the authorization is terminated  or revoked sooner.       Influenza A by PCR NEGATIVE NEGATIVE Final   Influenza B by PCR NEGATIVE NEGATIVE Final    Comment: (NOTE) The Xpert Xpress SARS-CoV-2/FLU/RSV plus assay is intended as an aid in the diagnosis of influenza from Nasopharyngeal swab specimens and should not be used as a sole basis for treatment. Nasal washings and aspirates are unacceptable for Xpert Xpress SARS-CoV-2/FLU/RSV testing.  Fact Sheet for Patients: EntrepreneurPulse.com.au  Fact Sheet for Healthcare Providers: IncredibleEmployment.be  This test is not yet approved or cleared by the [place] FDA and has been authorized for detection and/or diagnosis of SARS-CoV-2 by FDA under an Emergency Use Authorization (EUA). This EUA will remain in effect (meaning this test can be used) for the duration of the COVID-19 declaration under Section 564(b)(1) of the Act, 21 U.S.C. section 360bbb-3(b)(1), unless the authorization is terminated or revoked.  Performed at [facility], [address] [place], [place] [address]          Radiology Studies: CT ABDOMEN PELVIS W CONTRAST  Result Date: 02/17/2022 CLINICAL DATA:   Abdominal pain and vomiting. EXAM: CT ABDOMEN AND PELVIS WITH CONTRAST TECHNIQUE: Multidetector CT imaging of the abdomen and pelvis was performed using the standard protocol following bolus administration of intravenous contrast. RADIATION DOSE REDUCTION: This exam was performed according to the departmental dose-optimization program which includes automated exposure control, adjustment of the mA and/or kV according to patient size and/or use of iterative reconstruction technique. CONTRAST:  142m OMNIPAQUE IOHEXOL 300 MG/ML  SOLN COMPARISON:  December 24, 2018 FINDINGS: Lower chest: Stable 5 mm solid noncalcified lung nodule is seen within the lateral aspect of the right lung base. Hepatobiliary: No focal liver abnormality  is seen. Status post cholecystectomy. A distal common bile duct is dilated and measures approximately 0.1 cm. Pancreas: Unremarkable. No pancreatic ductal dilatation or surrounding inflammatory changes. Spleen: Normal in size without focal abnormality. Adrenals/Urinary Tract: No adrenal hemorrhage or renal injury identified. Urinary bladder is poorly distended and subsequently limited evaluation. Overlying streak artifact is also seen. Stomach/Bowel: A 2.2 cm x 1.4 cm x 1.2 cm area of fluid and air, with a thin, hyperdense wall, is seen adjacent to the posteromedial aspect of the duodenal bulb which is markedly inflamed. There is no evidence of bowel dilatation. Noninflamed diverticula are seen throughout the sigmoid colon. Vascular/Lymphatic: No significant vascular findings are present. No enlarged abdominal or pelvic lymph nodes. Reproductive: Status post hysterectomy. No adnexal masses. Other: No abdominal wall hernia or abnormality. No abdominopelvic ascites. 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 There is evidence of prior vertebroplasty at the levels of  T12 and L1 with postoperative changes seen at the level of L5-S1. IMPRESSION: 1. Marked severity proximal duodenitis at the level of the duodenal bulb, with additional findings consistent with a contained perforation. Acute gastritis involving the adjacent portion of the gastric antrum cannot be excluded. 2. Sigmoid diverticulosis. 3. Evidence of prior vertebroplasty at the levels of T12 and L1 with postoperative changes seen at the level of L5-S1. Electronically Signed   By: Virgina Norfolk M.D.   On: 02/17/2022 03:56   DG ABD ACUTE 2+V W 1V CHEST  Result Date: 02/17/2022 CLINICAL DATA:  Abdominal pain and vomiting. EXAM: DG ABDOMEN ACUTE WITH 1 VIEW CHEST COMPARISON:  Abdominopelvic CT 12/24/2018 FINDINGS: Upper normal heart size. Subsegmental atelectasis at the right lung base and left mid lung. No confluent consolidation. Biapical pleuroparenchymal scarring. No pleural fluid. No bowel dilatation to suggest obstruction. No significant formed stool in the colon. Cholecystectomy clips in the right upper quadrant. Thoraco lumbar vertebral augmentation. Lower lumbar surgical hardware. Right hip arthroplasty. IMPRESSION: 1. Normal bowel gas pattern without evidence of obstruction. 2. Subsegmental atelectasis at the right lung base and left midlung. Electronically Signed   By: Keith Rake M.D.   On: 02/17/2022 01:42        Scheduled Meds:  escitalopram  30 mg Oral Daily   heparin  5,000 Units Subcutaneous Q8H   levothyroxine  112 mcg Oral QAC breakfast   [START ON 02/21/2022] pantoprazole  40 mg Intravenous Q12H   Continuous Infusions:  sodium chloride 75 mL/hr at 02/17/22 1558   pantoprazole 8 mg/hr (02/18/22 1022)   piperacillin-tazobactam (ZOSYN)  IV Stopped (02/18/22 0800)     LOS: 1 day    Time spent: 35 minutes    Parlee Amescua Darleen Crocker, DO [facility]  If 7PM-7AM, please contact night-coverage [email or web address] 02/18/2022, 12:16 PM

## 2022-02-18 NOTE — Hospital Course (Addendum)
At thePer HPI: ?Andrea Martinez is a 76 y.o. female with medical history significant of with history of IBS, anxiety, CML, gastric ulcer, hypothyroidism, hyperlipidemia, and more presents ED with a chief complaint of abdominal pain.  Patient reports that abdominal pain has been cramping in her lower abdomen and a cramping and burning in her upper abdomen.  This started 3 weeks ago with gradual onset.  Today it is severe.  She has associated nausea and vomiting.  She has not been eating so the vomit appears as white frothy.  She has not seen any hematemesis or coffee-ground emesis.  She has had diarrhea up to 3 times per day.  No bloody stool or melena.  She has had a decreased appetite.  He has been attempting to control her symptoms with Imodium at home and Mylanta.  They have not helped.  She reports she was on an antibiotic for this, but the last antibiotic in the system was Augmentin-and she reports that was on for sinus infection.  Patient reports that her pain is much worse when she stands straight, better when she doubles over.  She has had a lot of GI issues before, but she has not had pain like this before.  Patient has no other complaints at this time. ?  ?02/17/22: Patient was admitted for contained perforation of and is currently n.p.o. on IV fluid with Zosyn ongoing.  Appreciate general surgery consultation with no need for operative intervention at this time.  GI evaluation pending. ? ?02/18/22: GI has recommended initiation of PPI infusion over the next 72 hours transition to twice daily PPI on discharge.  She continues to remain n.p.o. at this time with plans for dietary advancement per general surgery.  Continue IV Zosyn.  H. pylori serologies pending.  Lactic acidosis has improved. ? ?02/19/22: Continue IV fluid and Zosyn.  Being advanced to full liquid diet today.  H. pylori serologies negative. ? ?02/20/22: Patient is tolerating dietary advancement.  Discontinue IV fluid.  Anticipate discharge in the  next 24 hours if stable. ?

## 2022-02-19 LAB — COMPREHENSIVE METABOLIC PANEL
ALT: 27 U/L (ref 0–44)
AST: 34 U/L (ref 15–41)
Albumin: 3 g/dL — ABNORMAL LOW (ref 3.5–5.0)
Alkaline Phosphatase: 122 U/L (ref 38–126)
Anion gap: 7 (ref 5–15)
BUN: 19 mg/dL (ref 8–23)
CO2: 21 mmol/L — ABNORMAL LOW (ref 22–32)
Calcium: 8 mg/dL — ABNORMAL LOW (ref 8.9–10.3)
Chloride: 108 mmol/L (ref 98–111)
Creatinine, Ser: 0.93 mg/dL (ref 0.44–1.00)
GFR, Estimated: >60 mL/min (ref >60)
Glucose, Bld: 96 mg/dL (ref 70–99)
Potassium: 4 mmol/L (ref 3.5–5.1)
Sodium: 136 mmol/L (ref 135–145)
Total Bilirubin: 0.8 mg/dL (ref 0.3–1.2)
Total Protein: 6 g/dL — ABNORMAL LOW (ref 6.5–8.1)

## 2022-02-19 LAB — CBC
HCT: 32.4 % — ABNORMAL LOW (ref 36.0–46.0)
Hemoglobin: 9.1 g/dL — ABNORMAL LOW (ref 12.0–15.0)
MCH: 25.4 pg — ABNORMAL LOW (ref 26.0–34.0)
MCHC: 28.1 g/dL — ABNORMAL LOW (ref 30.0–36.0)
MCV: 90.5 fL (ref 80.0–100.0)
Platelets: 206 10*3/uL (ref 150–400)
RBC: 3.58 MIL/uL — ABNORMAL LOW (ref 3.87–5.11)
RDW: 13.9 % (ref 11.5–15.5)
WBC: 8.6 10*3/uL (ref 4.0–10.5)
nRBC: 0 % (ref 0.0–0.2)

## 2022-02-19 LAB — MAGNESIUM: Magnesium: 2.1 mg/dL (ref 1.7–2.4)

## 2022-02-19 MED ORDER — DICYCLOMINE HCL 10 MG PO CAPS
10.0000 mg | ORAL_CAPSULE | Freq: Three times a day (TID) | ORAL | Status: DC | PRN
  Administered 2022-02-19 – 2022-02-20 (×4): 10 mg via ORAL
  Filled 2022-02-19 (×4): qty 1

## 2022-02-19 NOTE — Progress Notes (Signed)
PROGRESS NOTE    Andrea Martinez  WIO:[id number] DOB: [birth year] DOA: 02/17/2022 PCP: Loman Brooklyn, FNP   Brief Narrative:  At thePer HPI: Andrea Martinez is a 76 y.o. female with medical history significant of with history of IBS, anxiety, CML, gastric ulcer, hypothyroidism, hyperlipidemia, and more presents ED with a chief complaint of abdominal pain.  Patient reports that abdominal pain has been cramping in her lower abdomen and a cramping and burning in her upper abdomen.  This started 3 weeks ago with gradual onset.  Today it is severe.  She has associated nausea and vomiting.  She has not been eating so the vomit appears as white frothy.  She has not seen any hematemesis or coffee-ground emesis.  She has had diarrhea up to 3 times per day.  No bloody stool or melena.  She has had a decreased appetite.  He has been attempting to control her symptoms with Imodium at home and Mylanta.  They have not helped.  She reports she was on an antibiotic for this, but the last antibiotic in the system was Augmentin-and she reports that was on for sinus infection.  Patient reports that her pain is much worse when she stands straight, better when she doubles over.  She has had a lot of GI issues before, but she has not had pain like this before.  Patient has no other complaints at this time.   02/17/22: Patient was admitted for contained perforation of and is currently n.p.o. on IV fluid with Zosyn ongoing.  Appreciate general surgery consultation with no need for operative intervention at this time.  GI evaluation pending.  02/18/22: GI has recommended initiation of PPI infusion over the next 72 hours transition to twice daily PPI on discharge.  She continues to remain n.p.o. at this time with plans for dietary advancement per general surgery.  Continue IV Zosyn.  H. pylori serologies pending.  Lactic acidosis has improved.  02/19/22: Continue IV fluid and Zosyn.  Being advanced to full liquid diet today.  H.  pylori serologies negative.    Assessment & Plan:   Principal Problem:   Perforation of duodenum (HCC) Active Problems:   Hypothyroidism   Nausea and vomiting   EPIGASTRIC PAIN   Anxiety   Duodenitis   Diarrhea  Assessment and Plan: * Perforation of duodenum (HCC) Contained perf associated with duodenitis Diet being advanced by general surgery Continue zosyn   Duodenitis Protonix bolus in ED Continue protonix IV drip as recommended by GI Appreciate ongoing general surgery recommendations for dietary advancement GI to follow-up outpatient in 2-3 weeks Pain control with pain scale NPO Maintenance fluids Continue to monitor  Anxiety Continue lexapro PRN ativan  Hypothyroidism Continue synthroid     DVT prophylaxis: Heparin Code Status: Full Family Communication: None at bedside Disposition Plan:  Status is: Inpatient Remains inpatient appropriate because: Need for IV medications.   Consultants:  GI General surgery   Procedures:  See below   Antimicrobials:  Anti-infectives (From admission, onward)    Start     Dose/Rate Route Frequency Ordered Stop   02/17/22 1200  piperacillin-tazobactam (ZOSYN) IVPB 3.375 g  Status:  Discontinued        3.375 g 100 mL/hr over 30 Minutes Intravenous Every 8 hours 02/17/22 0458 02/17/22 0502   02/17/22 1200  piperacillin-tazobactam (ZOSYN) IVPB 3.375 g        3.375 g 12.5 mL/hr over 240 Minutes Intravenous Every 8 hours 02/17/22 0502  02/17/22 0415  piperacillin-tazobactam (ZOSYN) IVPB 3.375 g        3.375 g 100 mL/hr over 30 Minutes Intravenous  Once 02/17/22 0405 02/17/22 0450      Subjective: Patient seen and evaluated today with noted abdominal cramping overnight.  She states that her overall pain is improving and denies any nausea, vomiting, or diarrhea.  No further fevers noted overnight.  Objective: Vitals:   02/18/22 0543 02/18/22 1418 02/18/22 2100 02/19/22 0410  BP: (!) 118/55 (!) 119/52 (!)  115/43 (!) 100/45  Pulse: 97 80 74 67  Resp: '18 20 15 14  '$ Temp: 99.3 F (37.4 C) 98.1 F (36.7 C) 98.2 F (36.8 C) 97.9 F (36.6 C)  TempSrc: Oral Oral Oral Oral  SpO2: 95% 96% 90% 99%  Weight:      Height:        Intake/Output Summary (Last 24 hours) at 02/19/2022 1057 Last data filed at 02/19/2022 0900 Gross per 24 hour  Intake 2660.29 ml  Output 300 ml  Net 2360.29 ml   Filed Weights   02/17/22 0106  Weight: 92.1 kg    Examination:  General exam: Appears calm and comfortable  Respiratory system: Clear to auscultation. Respiratory effort normal. Cardiovascular system: S1 & S2 heard, RRR.  Gastrointestinal system: Abdomen is soft Central nervous system: Alert and awake Extremities: No edema Skin: No significant lesions noted Psychiatry: Flat affect.    Data Reviewed: I have personally reviewed following labs and imaging studies  CBC: Recent Labs  Lab 02/17/22 0145 02/18/22 0552 02/19/22 0547  WBC 13.7* 14.3* 8.6  NEUTROABS 11.8* 11.8*  --   HGB 10.8* 10.1* 9.1*  HCT 35.5* 33.9* 32.4*  MCV 87.2 89.2 90.5  PLT 338 306 093   Basic Metabolic Panel: Recent Labs  Lab 02/17/22 0145 02/18/22 0552 02/19/22 0547  NA 136 137 136  K 4.7 4.7 4.0  CL 103 108 108  CO2 23 19* 21*  GLUCOSE 143* 106* 96  BUN '23 19 19  '$ CREATININE 0.97 1.08* 0.93  CALCIUM 8.8* 8.2* 8.0*  MG 2.2 2.3 2.1   GFR: Estimated Creatinine Clearance: 56.8 mL/min (by C-G formula based on SCr of 0.93 mg/dL). Liver Function Tests: Recent Labs  Lab 02/17/22 0145 02/18/22 0552 02/19/22 0547  AST 25 48* 34  ALT 19 37 27  ALKPHOS 126 141* 122  BILITOT 0.4 0.8 0.8  PROT 7.4 6.6 6.0*  ALBUMIN 3.7 3.3* 3.0*   Recent Labs  Lab 02/17/22 0145  LIPASE 31   No results for input(s): AMMONIA in the last 168 hours. Coagulation Profile: No results for input(s): INR, PROTIME in the last 168 hours. Cardiac Enzymes: No results for input(s): CKTOTAL, CKMB, CKMBINDEX, TROPONINI in the last 168  hours. BNP (last 3 results) No results for input(s): PROBNP in the last 8760 hours. HbA1C: No results for input(s): HGBA1C in the last 72 hours. CBG: No results for input(s): GLUCAP in the last 168 hours. Lipid Profile: No results for input(s): CHOL, HDL, LDLCALC, TRIG, CHOLHDL, LDLDIRECT in the last 72 hours. Thyroid Function Tests: No results for input(s): TSH, T4TOTAL, FREET4, T3FREE, THYROIDAB in the last 72 hours. Anemia Panel: No results for input(s): VITAMINB12, FOLATE, FERRITIN, TIBC, IRON, RETICCTPCT in the last 72 hours. Sepsis Labs: Recent Labs  Lab 02/17/22 0145 02/18/22 0552  LATICACIDVEN 2.4* 0.8    Recent Results (from the past 240 hour(s))  Microscopic Examination     Status: Abnormal   Collection Time: 02/11/22 10:35 AM   Urine  Result Value Ref Range Status   WBC, UA 0-5 0 - 5 /hpf Final   RBC None seen 0 - 2 /hpf Final   Epithelial Cells (non renal) 0-10 0 - 10 /hpf Final   Renal Epithel, UA None seen None seen /hpf Final   Bacteria, UA Few (A) None seen/Few Final  Resp Panel by RT-PCR (Flu A&B, Covid) Nasopharyngeal Swab     Status: None   Collection Time: 02/17/22  4:22 AM   Specimen: Nasopharyngeal Swab; Nasopharyngeal(NP) swabs in vial transport medium  Result Value Ref Range Status   SARS Coronavirus 2 by RT PCR NEGATIVE NEGATIVE Final    Comment: (NOTE) SARS-CoV-2 target nucleic acids are NOT DETECTED.  The SARS-CoV-2 RNA is generally detectable in upper respiratory specimens during the acute phase of infection. The lowest concentration of SARS-CoV-2 viral copies this assay can detect is 138 copies/mL. A negative result does not preclude SARS-Cov-2 infection and should not be used as the sole basis for treatment or other patient management decisions. A negative result may occur with  improper specimen collection/handling, submission of specimen other than nasopharyngeal swab, presence of viral mutation(s) within the areas targeted by this assay,  and inadequate number of viral copies(<138 copies/mL). A negative result must be combined with clinical observations, patient history, and epidemiological information. The expected result is Negative.  Fact Sheet for Patients:  EntrepreneurPulse.com.au  Fact Sheet for Healthcare Providers:  IncredibleEmployment.be  This test is no t yet approved or cleared by the [place] FDA and  has been authorized for detection and/or diagnosis of SARS-CoV-2 by FDA under an Emergency Use Authorization (EUA). This EUA will remain  in effect (meaning this test can be used) for the duration of the COVID-19 declaration under Section 564(b)(1) of the Act, 21 U.S.C.section 360bbb-3(b)(1), unless the authorization is terminated  or revoked sooner.       Influenza A by PCR NEGATIVE NEGATIVE Final   Influenza B by PCR NEGATIVE NEGATIVE Final    Comment: (NOTE) The Xpert Xpress SARS-CoV-2/FLU/RSV plus assay is intended as an aid in the diagnosis of influenza from Nasopharyngeal swab specimens and should not be used as a sole basis for treatment. Nasal washings and aspirates are unacceptable for Xpert Xpress SARS-CoV-2/FLU/RSV testing.  Fact Sheet for Patients: EntrepreneurPulse.com.au  Fact Sheet for Healthcare Providers: IncredibleEmployment.be  This test is not yet approved or cleared by the [place] FDA and has been authorized for detection and/or diagnosis of SARS-CoV-2 by FDA under an Emergency Use Authorization (EUA). This EUA will remain in effect (meaning this test can be used) for the duration of the COVID-19 declaration under Section 564(b)(1) of the Act, 21 U.S.C. section 360bbb-3(b)(1), unless the authorization is terminated or revoked.  Performed at [facility], [address] [place], [place] [address]          Radiology Studies: No results found.      Scheduled Meds:  escitalopram  30  mg Oral Daily   heparin  5,000 Units Subcutaneous Q8H   levothyroxine  112 mcg Oral QAC breakfast   [START ON 02/21/2022] pantoprazole  40 mg Intravenous Q12H   Continuous Infusions:  sodium chloride 75 mL/hr at 02/19/22 0332   pantoprazole 8 mg/hr (02/18/22 1022)   piperacillin-tazobactam (ZOSYN)  IV Stopped (02/19/22 0730)     LOS: 2 days    Time spent: 35 minutes    Andrea Amberg Darleen Crocker, DO [facility]  If 7PM-7AM, please contact night-coverage [email or web address] 02/19/2022, 10:57 AM

## 2022-02-19 NOTE — Care Management Important Message (Signed)
Important Message ? ?Patient Details  ?Name: Andrea Martinez ?MRN: [id number] ?Date of Birth: November 12, 1946 ? ? ?Medicare Important Message Given:  Yes ? ? ? ? ?Tommy Medal ?02/19/2022, 3:45 PM ?

## 2022-02-19 NOTE — Plan of Care (Signed)

## 2022-02-19 NOTE — Progress Notes (Signed)
[facility] Progress Note ? ?   ?Subjective: ?[name] is a 76 y.o. female who was admitted for contained perforated duodenum. On morning rounds this morning patient states she had an episode of lower abdominal cramping last evening after initiating clear liquid diet. Cramping was described to be similar to the pain she had prior to presenting to the ED however was less severe. Pain has improved and is now 6/10 in intensity. She states her pain had been improving after being started on PPI regimen prior to being started on clear fluids. She denies nausea, vomiting, or diarrhea. No fever or chills. No bowel movement however she continues to pass flatus. Discussed negative H. Pylori serology results with patient.  ? ?Objective: ?Vital signs in last 24 hours: ?Temp:  [97.9 ?F (36.6 ?C)-98.2 ?F (36.8 ?C)] 97.9 ?F (36.6 ?C) (03/10 0410) ?Pulse Rate:  [67-80] 67 (03/10 0410) ?Resp:  [14-20] 14 (03/10 0410) ?BP: (100-119)/(43-52) 100/45 (03/10 0410) ?SpO2:  [90 %-99 %] 99 % (03/10 0410) ?Last BM Date : 02/16/22 ? ?Intake/Output from previous day: ?03/09 0701 - 03/10 0700 ?In: 2793.8 [P.O.:520; I.V.:2023.8; IV Piggyback:250] ?Out: 650 [Urine:650] ?Intake/Output this shift: ?No intake/output data recorded. ? ?General appearance: alert, cooperative, no distress, and uncomfortable appearing ?Cardio: regular rate and rhythm and S1, S2 normal ?GI: Diffuse abdominal tenderness to palpation, greater in the lower quadrants. Increased left upper quadrant tenderness compared to prior exam.  ? ?Lab Results:  ?Recent Labs  ?  02/18/22 ?8088 02/19/22 ?1103  ?WBC 14.3* 8.6  ?HGB 10.1* 9.1*  ?HCT 33.9* 32.4*  ?PLT 306 206  ? ?BMET ?Recent Labs  ?  02/18/22 ?1594 02/19/22 ?5859  ?NA 137 136  ?K 4.7 4.0  ?CL 108 108  ?CO2 19* 21*  ?GLUCOSE 106* 96  ?BUN 19 19  ?CREATININE 1.08* 0.93  ?CALCIUM 8.2* 8.0*  ? ?PT/INR ?No results for input(s): LABPROT, INR in the last 72 hours. ? ?Studies/Results: ?No results  found. ? ?Anti-infectives: ?Anti-infectives (From admission, onward)  ? ? Start     Dose/Rate Route Frequency Ordered Stop  ? 02/17/22 1200  piperacillin-tazobactam (ZOSYN) IVPB 3.375 g  Status:  Discontinued       ? 3.375 g ?100 mL/hr over 30 Minutes Intravenous Every 8 hours 02/17/22 0458 02/17/22 0502  ? 02/17/22 1200  piperacillin-tazobactam (ZOSYN) IVPB 3.375 g       ? 3.375 g ?12.5 mL/hr over 240 Minutes Intravenous Every 8 hours 02/17/22 0502    ? 02/17/22 0415  piperacillin-tazobactam (ZOSYN) IVPB 3.375 g       ? 3.375 g ?100 mL/hr over 30 Minutes Intravenous  Once 02/17/22 0405 02/17/22 0450  ? ?  ? ? ?Assessment/Plan: ?Patient is a 76 y.o. female with history of peptic ulcer disease admitted for contained perforated duodenum. ? ?-Patient had an episode of abdominal cramping last evening after being started on clear fluids. On exam patient is more diffusely tender throughout her abdomen.  ?-Will advance diet to full liquid diet to see if patient is able to tolerate this better.  ?-Labs show improvement in leukocytosis at 8.6 from 14.3.  ?-H&H stable.  ?-Vitals are stable, patient is afebrile.  ?-H. Pylori serology was negative.  ?-Patient is currently on PPI regimen. Continue PPI and management for pain.  ? ? LOS: 2 days  ? ? ?Andrea Martinez ?02/19/2022 ? ?

## 2022-02-20 LAB — CBC
HCT: 31.1 % — ABNORMAL LOW (ref 36.0–46.0)
Hemoglobin: 9.4 g/dL — ABNORMAL LOW (ref 12.0–15.0)
MCH: 26.2 pg (ref 26.0–34.0)
MCHC: 30.2 g/dL (ref 30.0–36.0)
MCV: 86.6 fL (ref 80.0–100.0)
Platelets: 237 10*3/uL (ref 150–400)
RBC: 3.59 MIL/uL — ABNORMAL LOW (ref 3.87–5.11)
RDW: 13.9 % (ref 11.5–15.5)
WBC: 8.4 10*3/uL (ref 4.0–10.5)
nRBC: 0 % (ref 0.0–0.2)

## 2022-02-20 LAB — BASIC METABOLIC PANEL
Anion gap: 6 (ref 5–15)
BUN: 10 mg/dL (ref 8–23)
CO2: 25 mmol/L (ref 22–32)
Calcium: 8.4 mg/dL — ABNORMAL LOW (ref 8.9–10.3)
Chloride: 110 mmol/L (ref 98–111)
Creatinine, Ser: 0.85 mg/dL (ref 0.44–1.00)
GFR, Estimated: >60 mL/min (ref >60)
Glucose, Bld: 103 mg/dL — ABNORMAL HIGH (ref 70–99)
Potassium: 4.5 mmol/L (ref 3.5–5.1)
Sodium: 141 mmol/L (ref 135–145)

## 2022-02-20 LAB — MAGNESIUM: Magnesium: 2 mg/dL (ref 1.7–2.4)

## 2022-02-20 MED ORDER — BUTALBITAL-APAP-CAFFEINE 50-325-40 MG PO TABS
1.0000 | ORAL_TABLET | Freq: Four times a day (QID) | ORAL | Status: DC | PRN
  Administered 2022-02-20 – 2022-02-21 (×2): 1 via ORAL
  Filled 2022-02-20 (×2): qty 1

## 2022-02-20 NOTE — Progress Notes (Signed)
?  Subjective: ?Patient is passing flatus and is having loose bowel movements.  She states her abdominal pain has eased.  She feels slightly better than yesterday.  Is tolerating full liquid diet. ? ?Objective: ?Vital signs in last 24 hours: ?Temp:  [98.1 ?F (36.7 ?C)-98.2 ?F (36.8 ?C)] 98.1 ?F (36.7 ?C) (03/11 3664) ?Pulse Rate:  [65-80] 65 (03/11 0528) ?Resp:  [16] 16 (03/11 0528) ?BP: (110-134)/(58-63) 110/63 (03/11 0528) ?SpO2:  [94 %-100 %] 99 % (03/11 0528) ?Last BM Date : 02/19/22 ? ?Intake/Output from previous day: ?03/10 0701 - 03/11 0700 ?In: 2310.4 [P.O.:780; I.V.:1444.7; IV Piggyback:85.7] ?Out: 1550 [QIHKV:4259] ?Intake/Output this shift: ?No intake/output data recorded. ? ?General appearance: alert, cooperative, and no distress ?GI: soft, non-tender; bowel sounds normal; no masses,  no organomegaly ? ?Lab Results:  ?Recent Labs  ?  02/19/22 ?5638 02/20/22 ?0604  ?WBC 8.6 8.4  ?HGB 9.1* 9.4*  ?HCT 32.4* 31.1*  ?PLT 206 237  ? ?BMET ?Recent Labs  ?  02/19/22 ?7564 02/20/22 ?0604  ?NA 136 141  ?K 4.0 4.5  ?CL 108 110  ?CO2 21* 25  ?GLUCOSE 96 103*  ?BUN 19 10  ?CREATININE 0.93 0.85  ?CALCIUM 8.0* 8.4*  ? ?PT/INR ?No results for input(s): LABPROT, INR in the last 72 hours. ? ?Studies/Results: ?No results found. ? ?Anti-infectives: ?Anti-infectives (From admission, onward)  ? ? Start     Dose/Rate Route Frequency Ordered Stop  ? 02/17/22 1200  piperacillin-tazobactam (ZOSYN) IVPB 3.375 g  Status:  Discontinued       ? 3.375 g ?100 mL/hr over 30 Minutes Intravenous Every 8 hours 02/17/22 0458 02/17/22 0502  ? 02/17/22 1200  piperacillin-tazobactam (ZOSYN) IVPB 3.375 g       ? 3.375 g ?12.5 mL/hr over 240 Minutes Intravenous Every 8 hours 02/17/22 0502    ? 02/17/22 0415  piperacillin-tazobactam (ZOSYN) IVPB 3.375 g       ? 3.375 g ?100 mL/hr over 30 Minutes Intravenous  Once 02/17/22 0405 02/17/22 0450  ? ?  ? ? ?Assessment/Plan: ?Impression: Duodenitis with contained perforation, slowly resolving.  No need  for acute surgical invention at this time. ?Plan: Will advance to soft diet.  Encourage ambulation. ? LOS: 3 days  ? ? ?Aviva Signs ?02/20/2022  ?

## 2022-02-20 NOTE — Progress Notes (Signed)
PROGRESS NOTE    Andrea Martinez  WGY:[id number] DOB: Apr 13, 1946 DOA: 02/17/2022 PCP: Loman Brooklyn, FNP   Brief Narrative:  At thePer HPI: Andrea Martinez is a 76 y.o. female with medical history significant of with history of IBS, anxiety, CML, gastric ulcer, hypothyroidism, hyperlipidemia, and more presents ED with a chief complaint of abdominal pain.  Patient reports that abdominal pain has been cramping in her lower abdomen and a cramping and burning in her upper abdomen.  This started 3 weeks ago with gradual onset.  Today it is severe.  She has associated nausea and vomiting.  She has not been eating so the vomit appears as white frothy.  She has not seen any hematemesis or coffee-ground emesis.  She has had diarrhea up to 3 times per day.  No bloody stool or melena.  She has had a decreased appetite.  He has been attempting to control her symptoms with Imodium at home and Mylanta.  They have not helped.  She reports she was on an antibiotic for this, but the last antibiotic in the system was Augmentin-and she reports that was on for sinus infection.  Patient reports that her pain is much worse when she stands straight, better when she doubles over.  She has had a lot of GI issues before, but she has not had pain like this before.  Patient has no other complaints at this time.   02/17/22: Patient was admitted for contained perforation of and is currently n.p.o. on IV fluid with Zosyn ongoing.  Appreciate general surgery consultation with no need for operative intervention at this time.  GI evaluation pending.  02/18/22: GI has recommended initiation of PPI infusion over the next 72 hours transition to twice daily PPI on discharge.  She continues to remain n.p.o. at this time with plans for dietary advancement per general surgery.  Continue IV Zosyn.  H. pylori serologies pending.  Lactic acidosis has improved.  02/19/22: Continue IV fluid and Zosyn.  Being advanced to full liquid diet today.  H.  pylori serologies negative.  02/20/22: Patient is tolerating dietary advancement.  Discontinue IV fluid.  Anticipate discharge in the next 24 hours if stable.    Assessment & Plan:   Principal Problem:   Perforation of duodenum (HCC) Active Problems:   Hypothyroidism   Nausea and vomiting   EPIGASTRIC PAIN   Anxiety   Duodenitis   Diarrhea  Assessment and Plan: * Perforation of duodenum (HCC) Contained perf associated with duodenitis Diet being advanced by general surgery Continue zosyn   Duodenitis Protonix bolus in ED Continue protonix IV drip as recommended by GI Appreciate ongoing general surgery recommendations for dietary advancement GI to follow-up outpatient in 2-3 weeks Pain control with pain scale NPO Maintenance fluids Continue to monitor  Anxiety Continue lexapro PRN ativan  Hypothyroidism Continue synthroid    DVT prophylaxis: Heparin Code Status: Full Family Communication: None at bedside Disposition Plan:  Status is: Inpatient Remains inpatient appropriate because: Need for IV medications.   Consultants:  GI General surgery   Procedures:  See below   Antimicrobials:  Anti-infectives (From admission, onward)    Start     Dose/Rate Route Frequency Ordered Stop   02/17/22 1200  piperacillin-tazobactam (ZOSYN) IVPB 3.375 g  Status:  Discontinued        3.375 g 100 mL/hr over 30 Minutes Intravenous Every 8 hours 02/17/22 0458 02/17/22 0502   02/17/22 1200  piperacillin-tazobactam (ZOSYN) IVPB 3.375 g  3.375 g 12.5 mL/hr over 240 Minutes Intravenous Every 8 hours 02/17/22 0502     02/17/22 0415  piperacillin-tazobactam (ZOSYN) IVPB 3.375 g        3.375 g 100 mL/hr over 30 Minutes Intravenous  Once 02/17/22 0405 02/17/22 0450       Subjective: Patient seen and evaluated today with ongoing abdominal cramping.  She appears to be tolerating full liquid diet.  No nausea or vomiting noted.  She is also having loose bowel movements and  intermittent headaches.  Objective: Vitals:   02/19/22 0410 02/19/22 1259 02/19/22 2047 02/20/22 0528  BP: (!) 100/45 (!) 133/58 (!) 134/59 110/63  Pulse: 67 80 71 65  Resp: '14 16 16 16  '$ Temp: 97.9 F (36.6 C) 98.2 F (36.8 C) 98.1 F (36.7 C) 98.1 F (36.7 C)  TempSrc: Oral  Oral Oral  SpO2: 99% 100% 94% 99%  Weight:      Height:        Intake/Output Summary (Last 24 hours) at 02/20/2022 1331 Last data filed at 02/20/2022 0908 Gross per 24 hour  Intake 1700.37 ml  Output 1550 ml  Net 150.37 ml   Filed Weights   02/17/22 0106  Weight: 92.1 kg    Examination:  General exam: Appears calm and comfortable  Respiratory system: Clear to auscultation. Respiratory effort normal. Cardiovascular system: S1 & S2 heard, RRR.  Gastrointestinal system: Abdomen is soft Central nervous system: Alert and awake Extremities: No edema Skin: No significant lesions noted Psychiatry: Flat affect.    Data Reviewed: I have personally reviewed following labs and imaging studies  CBC: Recent Labs  Lab 02/17/22 0145 02/18/22 0552 02/19/22 0547 02/20/22 0604  WBC 13.7* 14.3* 8.6 8.4  NEUTROABS 11.8* 11.8*  --   --   HGB 10.8* 10.1* 9.1* 9.4*  HCT 35.5* 33.9* 32.4* 31.1*  MCV 87.2 89.2 90.5 86.6  PLT 338 306 206 364   Basic Metabolic Panel: Recent Labs  Lab 02/17/22 0145 02/18/22 0552 02/19/22 0547 02/20/22 0604  NA 136 137 136 141  K 4.7 4.7 4.0 4.5  CL 103 108 108 110  CO2 23 19* 21* 25  GLUCOSE 143* 106* 96 103*  BUN '23 19 19 10  '$ CREATININE 0.97 1.08* 0.93 0.85  CALCIUM 8.8* 8.2* 8.0* 8.4*  MG 2.2 2.3 2.1 2.0   GFR: Estimated Creatinine Clearance: 62.1 mL/min (by C-G formula based on SCr of 0.85 mg/dL). Liver Function Tests: Recent Labs  Lab 02/17/22 0145 02/18/22 0552 02/19/22 0547  AST 25 48* 34  ALT 19 37 27  ALKPHOS 126 141* 122  BILITOT 0.4 0.8 0.8  PROT 7.4 6.6 6.0*  ALBUMIN 3.7 3.3* 3.0*   Recent Labs  Lab 02/17/22 0145  LIPASE 31   No  results for input(s): AMMONIA in the last 168 hours. Coagulation Profile: No results for input(s): INR, PROTIME in the last 168 hours. Cardiac Enzymes: No results for input(s): CKTOTAL, CKMB, CKMBINDEX, TROPONINI in the last 168 hours. BNP (last 3 results) No results for input(s): PROBNP in the last 8760 hours. HbA1C: No results for input(s): HGBA1C in the last 72 hours. CBG: No results for input(s): GLUCAP in the last 168 hours. Lipid Profile: No results for input(s): CHOL, HDL, LDLCALC, TRIG, CHOLHDL, LDLDIRECT in the last 72 hours. Thyroid Function Tests: No results for input(s): TSH, T4TOTAL, FREET4, T3FREE, THYROIDAB in the last 72 hours. Anemia Panel: No results for input(s): VITAMINB12, FOLATE, FERRITIN, TIBC, IRON, RETICCTPCT in the last 72 hours. Sepsis Labs:  Recent Labs  Lab 02/17/22 0145 02/18/22 0552  LATICACIDVEN 2.4* 0.8    Recent Results (from the past 240 hour(s))  Microscopic Examination     Status: Abnormal   Collection Time: 02/11/22 10:35 AM   Urine  Result Value Ref Range Status   WBC, UA 0-5 0 - 5 /hpf Final   RBC None seen 0 - 2 /hpf Final   Epithelial Cells (non renal) 0-10 0 - 10 /hpf Final   Renal Epithel, UA None seen None seen /hpf Final   Bacteria, UA Few (A) None seen/Few Final  Resp Panel by RT-PCR (Flu A&B, Covid) Nasopharyngeal Swab     Status: None   Collection Time: 02/17/22  4:22 AM   Specimen: Nasopharyngeal Swab; Nasopharyngeal(NP) swabs in vial transport medium  Result Value Ref Range Status   SARS Coronavirus 2 by RT PCR NEGATIVE NEGATIVE Final    Comment: (NOTE) SARS-CoV-2 target nucleic acids are NOT DETECTED.  The SARS-CoV-2 RNA is generally detectable in upper respiratory specimens during the acute phase of infection. The lowest concentration of SARS-CoV-2 viral copies this assay can detect is 138 copies/mL. A negative result does not preclude SARS-Cov-2 infection and should not be used as the sole basis for treatment  or other patient management decisions. A negative result may occur with  improper specimen collection/handling, submission of specimen other than nasopharyngeal swab, presence of viral mutation(s) within the areas targeted by this assay, and inadequate number of viral copies(<138 copies/mL). A negative result must be combined with clinical observations, patient history, and epidemiological information. The expected result is Negative.  Fact Sheet for Patients:  EntrepreneurPulse.com.au  Fact Sheet for Healthcare Providers:  IncredibleEmployment.be  This test is no t yet approved or cleared by the [place] FDA and  has been authorized for detection and/or diagnosis of SARS-CoV-2 by FDA under an Emergency Use Authorization (EUA). This EUA will remain  in effect (meaning this test can be used) for the duration of the COVID-19 declaration under Section 564(b)(1) of the Act, 21 U.S.C.section 360bbb-3(b)(1), unless the authorization is terminated  or revoked sooner.       Influenza A by PCR NEGATIVE NEGATIVE Final   Influenza B by PCR NEGATIVE NEGATIVE Final    Comment: (NOTE) The Xpert Xpress SARS-CoV-2/FLU/RSV plus assay is intended as an aid in the diagnosis of influenza from Nasopharyngeal swab specimens and should not be used as a sole basis for treatment. Nasal washings and aspirates are unacceptable for Xpert Xpress SARS-CoV-2/FLU/RSV testing.  Fact Sheet for Patients: EntrepreneurPulse.com.au  Fact Sheet for Healthcare Providers: IncredibleEmployment.be  This test is not yet approved or cleared by the [place] FDA and has been authorized for detection and/or diagnosis of SARS-CoV-2 by FDA under an Emergency Use Authorization (EUA). This EUA will remain in effect (meaning this test can be used) for the duration of the COVID-19 declaration under Section 564(b)(1) of the Act, 21 U.S.C. section  360bbb-3(b)(1), unless the authorization is terminated or revoked.  Performed at [facility], [address] [place], [place] [address]          Radiology Studies: No results found.      Scheduled Meds:  escitalopram  30 mg Oral Daily   heparin  5,000 Units Subcutaneous Q8H   levothyroxine  112 mcg Oral QAC breakfast   [START ON 02/21/2022] pantoprazole  40 mg Intravenous Q12H   Continuous Infusions:  pantoprazole 8 mg/hr (02/20/22 1233)   piperacillin-tazobactam (ZOSYN)  IV 3.375 g (02/20/22 1312)  LOS: 3 days    Time spent: 35 minutes    Eidan Muellner Darleen Crocker, DO [facility]  If 7PM-7AM, please contact night-coverage [email or web address] 02/20/2022, 1:31 PM

## 2022-02-21 LAB — BASIC METABOLIC PANEL
Anion gap: 7 (ref 5–15)
BUN: 12 mg/dL (ref 8–23)
CO2: 23 mmol/L (ref 22–32)
Calcium: 8.4 mg/dL — ABNORMAL LOW (ref 8.9–10.3)
Chloride: 110 mmol/L (ref 98–111)
Creatinine, Ser: 0.98 mg/dL (ref 0.44–1.00)
GFR, Estimated: 59 mL/min — ABNORMAL LOW (ref >60)
Glucose, Bld: 108 mg/dL — ABNORMAL HIGH (ref 70–99)
Potassium: 3.7 mmol/L (ref 3.5–5.1)
Sodium: 140 mmol/L (ref 135–145)

## 2022-02-21 LAB — MAGNESIUM: Magnesium: 1.9 mg/dL (ref 1.7–2.4)

## 2022-02-21 MED ORDER — ONDANSETRON HCL 4 MG PO TABS: 4.0000 mg | ORAL_TABLET | Freq: Four times a day (QID) | ORAL | 0 refills | Status: DC | PRN

## 2022-02-21 MED ORDER — BUTALBITAL-APAP-CAFFEINE 50-325-40 MG PO TABS: 1.0000 | ORAL_TABLET | Freq: Four times a day (QID) | ORAL | 0 refills | Status: DC | PRN

## 2022-02-21 MED ORDER — PANTOPRAZOLE SODIUM 40 MG PO TBEC: 40.0000 mg | DELAYED_RELEASE_TABLET | Freq: Two times a day (BID) | ORAL | 1 refills | Status: DC

## 2022-02-21 MED ORDER — AMOXICILLIN-POT CLAVULANATE 875-125 MG PO TABS: 1.0000 | ORAL_TABLET | Freq: Two times a day (BID) | ORAL | 0 refills | Status: AC

## 2022-02-21 MED ORDER — DICYCLOMINE HCL 10 MG PO CAPS: 10.0000 mg | ORAL_CAPSULE | Freq: Three times a day (TID) | ORAL | 1 refills | Status: DC | PRN

## 2022-02-21 MED ORDER — OXYCODONE HCL 5 MG PO TABS: 5.0000 mg | ORAL_TABLET | Freq: Four times a day (QID) | ORAL | 0 refills | Status: DC | PRN

## 2022-02-21 NOTE — Progress Notes (Signed)
?  Subjective: ?Patient tolerating soft diet.  No nausea or vomiting noted.  Continues to have bowel movements.  Still has migratory lower abdominal pain. ? ?Objective: ?Vital signs in last 24 hours: ?Temp:  [98.1 ?F (36.7 ?C)-98.6 ?F (37 ?C)] 98.1 ?F (36.7 ?C) (03/12 0511) ?Pulse Rate:  [63-70] 63 (03/12 0511) ?Resp:  [15-20] 20 (03/12 0511) ?BP: (133-144)/(47-67) 144/67 (03/12 0511) ?SpO2:  [98 %] 98 % (03/12 0511) ?Last BM Date : 02/20/22 ? ?Intake/Output from previous day: ?03/11 0701 - 03/12 0700 ?In: 1671.9 [P.O.:960; I.V.:588.7; IV Piggyback:123.2] ?Out: 1050 [Urine:1050] ?Intake/Output this shift: ?No intake/output data recorded. ? ?General appearance: alert, cooperative, and no distress ?GI: Soft without rigidity.  Intermittent tenderness to deep palpation throughout the lower abdomen. ? ?Lab Results:  ?Recent Labs  ?  02/19/22 ?3545 02/20/22 ?0604  ?WBC 8.6 8.4  ?HGB 9.1* 9.4*  ?HCT 32.4* 31.1*  ?PLT 206 237  ? ?BMET ?Recent Labs  ?  02/20/22 ?0604 02/21/22 ?0436  ?NA 141 140  ?K 4.5 3.7  ?CL 110 110  ?CO2 25 23  ?GLUCOSE 103* 108*  ?BUN 10 12  ?CREATININE 0.85 0.98  ?CALCIUM 8.4* 8.4*  ? ?PT/INR ?No results for input(s): LABPROT, INR in the last 72 hours. ? ?Studies/Results: ?No results found. ? ?Anti-infectives: ?Anti-infectives (From admission, onward)  ? ? Start     Dose/Rate Route Frequency Ordered Stop  ? 02/21/22 0000  amoxicillin-clavulanate (AUGMENTIN) 875-125 MG tablet       ? 1 tablet Oral 2 times daily 02/21/22 0933 02/28/22 2359  ? 02/17/22 1200  piperacillin-tazobactam (ZOSYN) IVPB 3.375 g  Status:  Discontinued       ? 3.375 g ?100 mL/hr over 30 Minutes Intravenous Every 8 hours 02/17/22 0458 02/17/22 0502  ? 02/17/22 1200  piperacillin-tazobactam (ZOSYN) IVPB 3.375 g       ? 3.375 g ?12.5 mL/hr over 240 Minutes Intravenous Every 8 hours 02/17/22 0502    ? 02/17/22 0415  piperacillin-tazobactam (ZOSYN) IVPB 3.375 g       ? 3.375 g ?100 mL/hr over 30 Minutes Intravenous  Once 02/17/22 0405  02/17/22 0450  ? ?  ? ? ?Assessment/Plan: ?Impression: Severe duodenitis with contained perforation, resolving.  Patient has resumed regular diet and bowel function returning to normal. ?Plan: Okay for discharge from surgery standpoint.  Should be discharged on another 7 days of p.o. antibiotics.  Twice a day dosing of PPI recommended.  Patient will be following up with gastroenterology.  Follow-up with me as needed.  ? LOS: 4 days  ? ? ?Aviva Signs ?02/21/2022  ?

## 2022-02-21 NOTE — Progress Notes (Signed)
Patient states understanding of discharge instructions.  

## 2022-02-21 NOTE — Progress Notes (Signed)
Patient has not tolerated her soft diet well. She has complained of increase abdominal pain since eating. Patient is finally resting. Will continue to monitor.  ?

## 2022-02-21 NOTE — Plan of Care (Signed)
?  Problem: Education: ?Goal: Knowledge of General Education information will improve ?Description: Including pain rating scale, medication(s)/side effects and non-pharmacologic comfort measures ?Outcome: Progressing ?  ?Problem: Clinical Measurements: ?Goal: Ability to maintain clinical measurements within normal limits will improve ?Outcome: Progressing ?Goal: Will remain free from infection ?Outcome: Progressing ?Goal: Diagnostic test results will improve ?Outcome: Progressing ?Goal: Respiratory complications will improve ?Outcome: Progressing ?Goal: Cardiovascular complication will be avoided ?Outcome: Progressing ?  ?Problem: Nutrition: ?Goal: Adequate nutrition will be maintained ?Outcome: Progressing ?  ?Problem: Activity: ?Goal: Risk for activity intolerance will decrease ?Outcome: Progressing ?  ?

## 2022-02-21 NOTE — Plan of Care (Signed)

## 2022-02-21 NOTE — Discharge Summary (Signed)
Physician Discharge Summary  Andrea Martinez CZY:[id number] DOB: 01-03-1946 DOA: 02/17/2022  PCP: Andrea Brooklyn, FNP  Admit date: 02/17/2022  Discharge date: 02/21/2022  Admitted From:Home  Disposition:  Home  Recommendations for Outpatient Follow-up:  Follow up with PCP in 1-2 weeks Follow-up with gastroenterology in 2-3 weeks which will be scheduled Continue Augmentin for 1 more week as prescribed to complete 10-day course Continue PPI twice daily Bentyl prescribed as needed for abdominal spasms Oxycodone prescribed with 10 tablets and 0 refills as needed for severe pain and breakthrough Continue other home medications as prior  Home Health: None  Equipment/Devices: None  Discharge Condition:Stable  CODE STATUS: Full  Diet recommendation: Heart Healthy  Brief/Interim Summary: Per HPI: Andrea Martinez is a 76 y.o. female with medical history significant of with history of IBS, anxiety, CML, gastric ulcer, hypothyroidism, hyperlipidemia, and more presents ED with a chief complaint of abdominal pain.  Patient reports that abdominal pain has been cramping in her lower abdomen and a cramping and burning in her upper abdomen.  This started 3 weeks ago with gradual onset.  Today it is severe.  She has associated nausea and vomiting.  She has not been eating so the vomit appears as white frothy.  She has not seen any hematemesis or coffee-ground emesis.  She has had diarrhea up to 3 times per day.  No bloody stool or melena.  She has had a decreased appetite.  He has been attempting to control her symptoms with Imodium at home and Mylanta.  They have not helped.  She reports she was on an antibiotic for this, but the last antibiotic in the system was Augmentin-and she reports that was on for sinus infection.  Patient reports that her pain is much worse when she stands straight, better when she doubles over.  She has had a lot of GI issues before, but she has not had pain like this before.   Patient has no other complaints at this time.   02/17/22: Patient was admitted for contained perforation of and is currently n.p.o. on IV fluid with Zosyn ongoing.  Appreciate general surgery consultation with no need for operative intervention at this time.  GI evaluation pending.   02/18/22: GI has recommended initiation of PPI infusion over the next 72 hours transition to twice daily PPI on discharge.  She continues to remain n.p.o. at this time with plans for dietary advancement per general surgery.  Continue IV Zosyn.  H. pylori serologies pending.  Lactic acidosis has improved.   02/19/22: Continue IV fluid and Zosyn.  Being advanced to full liquid diet today.  H. pylori serologies negative.   02/20/22: Patient is tolerating dietary advancement.  Discontinue IV fluid.  Anticipate discharge in the next 24 hours if stable.   02/21/22: Patient is stable for discharge today and continues to have some mild cramping abdominal pain.  No other acute events noted throughout the course of this admission.  She may transition to oral antibiotics at this time and is tolerating diet.  Discharge Diagnoses:  Principal Problem:   Perforation of duodenum (HCC) Active Problems:   Hypothyroidism   Nausea and vomiting   EPIGASTRIC PAIN   Anxiety   Duodenitis   Diarrhea  Principal discharge diagnosis: Contained perforation of duodenum/duodenitis.  Discharge Instructions  Discharge Instructions     Diet - low sodium heart healthy   Complete by: As directed    Increase activity slowly   Complete by: As directed  Allergies as of 02/21/2022       Reactions   Nucynta [tapentadol Hcl] Swelling   Codeine Nausea Only, Other (See Comments)   Headache   Sulfonamide Derivatives Nausea Only, Other (See Comments)   Headache   Esomeprazole Magnesium Nausea Only   Fluconazole Rash   Redness and blistering on left thigh        Medication List     TAKE these medications    acetaminophen 500 MG  tablet Commonly known as: TYLENOL Take 1,000 mg by mouth every 8 (eight) hours as needed for moderate pain.   amoxicillin-clavulanate 875-125 MG tablet Commonly known as: AUGMENTIN Take 1 tablet by mouth 2 (two) times daily for 7 days.   butalbital-acetaminophen-caffeine 50-325-40 MG tablet Commonly known as: FIORICET Take 1 tablet by mouth every 6 (six) hours as needed for headache or migraine.   CENTRUM SILVER PO Take 1 tablet by mouth daily in the afternoon.   cetirizine 10 MG tablet Commonly known as: ZYRTEC Take 10 mg by mouth daily.   dicyclomine 10 MG capsule Commonly known as: BENTYL Take 1 capsule (10 mg total) by mouth 3 (three) times daily as needed for spasms. What changed:  when to take this reasons to take this   escitalopram 20 MG tablet Commonly known as: LEXAPRO TAKE 1 AND 1/2 TABLETS BY MOUTH DAILY   lactose free nutrition Liqd Take 237 mLs by mouth daily.   levothyroxine 112 MCG tablet Commonly known as: SYNTHROID Take 1 tablet (112 mcg total) by mouth daily before breakfast.   nilotinib 150 MG capsule Commonly known as: TASIGNA Take 2 capsules (300 mg total) by mouth every 12 (twelve) hours.   ondansetron 4 MG tablet Commonly known as: ZOFRAN Take 1 tablet (4 mg total) by mouth every 6 (six) hours as needed for nausea.   oxyCODONE 5 MG immediate release tablet Commonly known as: Oxy IR/ROXICODONE Take 1 tablet (5 mg total) by mouth every 6 (six) hours as needed for moderate pain, breakthrough pain or severe pain.   pantoprazole 40 MG tablet Commonly known as: Protonix Take 1 tablet (40 mg total) by mouth 2 (two) times daily.   PROBIOTIC DAILY PO Take 2 tablets by mouth daily in the afternoon.   sucralfate 1 GM/10ML suspension Commonly known as: Carafate Take 10 mLs (1 g total) by mouth 4 (four) times daily -  with meals and at bedtime.   Vitamin D3 50 MCG (2000 UT) Tabs Take 2,000 Units by mouth daily in the afternoon.         Follow-up Information     Andrea Brooklyn, FNP. Schedule an appointment as soon as possible for a visit in 1 week(s).   Specialty: Family Medicine Contact information: [place] [address] [place]. Go in 3 week(s).   Contact information: [place] [address] [phone]               Allergies  Allergen Reactions   Nucynta [Tapentadol Hcl] Swelling   Codeine Nausea Only and Other (See Comments)    Headache   Sulfonamide Derivatives Nausea Only and Other (See Comments)    Headache   Esomeprazole Magnesium Nausea Only   Fluconazole Rash    Redness and blistering on left thigh    Consultations: GI General surgery   Procedures/Studies: CT ABDOMEN PELVIS W CONTRAST  Result Date: 02/17/2022 CLINICAL DATA:  Abdominal pain and vomiting.  EXAM: CT ABDOMEN AND PELVIS WITH CONTRAST TECHNIQUE: Multidetector CT imaging of the abdomen and pelvis was performed using the standard protocol following bolus administration of intravenous contrast. RADIATION DOSE REDUCTION: This exam was performed according to the departmental dose-optimization program which includes automated exposure control, adjustment of the mA and/or kV according to patient size and/or use of iterative reconstruction technique. CONTRAST:  173m OMNIPAQUE IOHEXOL 300 MG/ML  SOLN COMPARISON:  December 24, 2018 FINDINGS: Lower chest: Stable 5 mm solid noncalcified lung nodule is seen within the lateral aspect of the right lung base. Hepatobiliary: No focal liver abnormality is seen. Status post cholecystectomy. A distal common bile duct is dilated and measures approximately 0.1 cm. Pancreas: Unremarkable. No pancreatic ductal dilatation or surrounding inflammatory changes. Spleen: Normal in size without focal abnormality. Adrenals/Urinary Tract: No adrenal hemorrhage or renal injury identified. Urinary bladder is poorly  distended and subsequently limited evaluation. Overlying streak artifact is also seen. Stomach/Bowel: A 2.2 cm x 1.4 cm x 1.2 cm area of fluid and air, with a thin, hyperdense wall, is seen adjacent to the posteromedial aspect of the duodenal bulb which is markedly inflamed. There is no evidence of bowel dilatation. Noninflamed diverticula are seen throughout the sigmoid colon. Vascular/Lymphatic: No significant vascular findings are present. No enlarged abdominal or pelvic lymph nodes. Reproductive: Status post hysterectomy. No adnexal masses. Other: No abdominal wall hernia or abnormality. No abdominopelvic ascites. 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 There is evidence of prior vertebroplasty at the levels of T12 and L1 with postoperative changes seen at the level of L5-S1. IMPRESSION: 1. Marked severity proximal duodenitis at the level of the duodenal bulb, with additional findings consistent with a contained perforation. Acute gastritis involving the adjacent portion of the gastric antrum cannot be excluded. 2. Sigmoid diverticulosis. 3. Evidence of prior vertebroplasty at the levels of T12 and L1 with postoperative changes seen at the level of L5-S1. Electronically Signed   By: TVirgina NorfolkM.D.   On: 02/17/2022 03:56   DG ABD ACUTE 2+V W 1V CHEST  Result Date: 02/17/2022 CLINICAL DATA:  Abdominal pain and vomiting. EXAM: DG ABDOMEN ACUTE WITH 1 VIEW CHEST COMPARISON:  Abdominopelvic CT 12/24/2018 FINDINGS: Upper normal heart size. Subsegmental atelectasis at the right lung base and left mid lung. No confluent consolidation. Biapical pleuroparenchymal scarring. No pleural fluid. No bowel dilatation to suggest obstruction. No significant formed stool in the colon. Cholecystectomy clips in the right upper quadrant. Thoraco lumbar vertebral augmentation. Lower lumbar  surgical hardware. Right hip arthroplasty. IMPRESSION: 1. Normal bowel gas pattern without evidence of obstruction. 2. Subsegmental atelectasis at the right lung base and left midlung. Electronically Signed   By: MKeith RakeM.D.   On: 02/17/2022 01:42     Discharge Exam: Vitals:   02/20/22 2030 02/21/22 0511  BP: (!) 133/47 (!) 144/67  Pulse: 69 63  Resp: 18 20  Temp: 98.3 F (36.8 C) 98.1 F (36.7 C)  SpO2: 98% 98%   Vitals:   02/20/22 0528 02/20/22 1647 02/20/22 2030 02/21/22 0511  BP: 110/63 (!) 133/50 (!) 133/47 (!) 144/67  Pulse: 65 70 69 63  Resp: '16 15 18 20  '$ Temp: 98.1 F (36.7 C) 98.6 F (37 C) 98.3 F (36.8 C) 98.1 F (36.7 C)  TempSrc: Oral  Oral Oral  SpO2: 99% 98% 98% 98%  Weight:      Height:  General: Pt is alert, awake, not in acute distress Cardiovascular: RRR, S1/S2 +, no rubs, no gallops Respiratory: CTA bilaterally, no wheezing, no rhonchi Abdominal: Soft, NT, ND, bowel sounds + Extremities: no edema, no cyanosis    The results of significant diagnostics from this hospitalization (including imaging, microbiology, ancillary and laboratory) are listed below for reference.     Microbiology: Recent Results (from the past 240 hour(s))  Microscopic Examination     Status: Abnormal   Collection Time: 02/11/22 10:35 AM   Urine  Result Value Ref Range Status   WBC, UA 0-5 0 - 5 /hpf Final   RBC None seen 0 - 2 /hpf Final   Epithelial Cells (non renal) 0-10 0 - 10 /hpf Final   Renal Epithel, UA None seen None seen /hpf Final   Bacteria, UA Few (A) None seen/Few Final  Resp Panel by RT-PCR (Flu A&B, Covid) Nasopharyngeal Swab     Status: None   Collection Time: 02/17/22  4:22 AM   Specimen: Nasopharyngeal Swab; Nasopharyngeal(NP) swabs in vial transport medium  Result Value Ref Range Status   SARS Coronavirus 2 by RT PCR NEGATIVE NEGATIVE Final    Comment: (NOTE) SARS-CoV-2 target nucleic acids are NOT DETECTED.  The SARS-CoV-2 RNA is  generally detectable in upper respiratory specimens during the acute phase of infection. The lowest concentration of SARS-CoV-2 viral copies this assay can detect is 138 copies/mL. A negative result does not preclude SARS-Cov-2 infection and should not be used as the sole basis for treatment or other patient management decisions. A negative result may occur with  improper specimen collection/handling, submission of specimen other than nasopharyngeal swab, presence of viral mutation(s) within the areas targeted by this assay, and inadequate number of viral copies(<138 copies/mL). A negative result must be combined with clinical observations, patient history, and epidemiological information. The expected result is Negative.  Fact Sheet for Patients:  EntrepreneurPulse.com.au  Fact Sheet for Healthcare Providers:  IncredibleEmployment.be  This test is no t yet approved or cleared by the [place] FDA and  has been authorized for detection and/or diagnosis of SARS-CoV-2 by FDA under an Emergency Use Authorization (EUA). This EUA will remain  in effect (meaning this test can be used) for the duration of the COVID-19 declaration under Section 564(b)(1) of the Act, 21 U.S.C.section 360bbb-3(b)(1), unless the authorization is terminated  or revoked sooner.       Influenza A by PCR NEGATIVE NEGATIVE Final   Influenza B by PCR NEGATIVE NEGATIVE Final    Comment: (NOTE) The Xpert Xpress SARS-CoV-2/FLU/RSV plus assay is intended as an aid in the diagnosis of influenza from Nasopharyngeal swab specimens and should not be used as a sole basis for treatment. Nasal washings and aspirates are unacceptable for Xpert Xpress SARS-CoV-2/FLU/RSV testing.  Fact Sheet for Patients: EntrepreneurPulse.com.au  Fact Sheet for Healthcare Providers: IncredibleEmployment.be  This test is not yet approved or cleared by the [place] FDA and has been authorized for detection and/or diagnosis of SARS-CoV-2 by FDA under an Emergency Use Authorization (EUA). This EUA will remain in effect (meaning this test can be used) for the duration of the COVID-19 declaration under Section 564(b)(1) of the Act, 21 U.S.C. section 360bbb-3(b)(1), unless the authorization is terminated or revoked.  Performed at [facility], [address], [place], [place] [address]      Labs: BNP (last 3 results) No results for input(s): BNP in the last 8760 hours. Basic Metabolic Panel: Recent Labs  Lab 02/17/22 0145  02/18/22 0552 02/19/22 0547 02/20/22 0604 02/21/22 0436  NA 136 137 136 141 140  K 4.7 4.7 4.0 4.5 3.7  CL 103 108 108 110 110  CO2 23 19* 21* 25 23  GLUCOSE 143* 106* 96 103* 108*  BUN '23 19 19 10 12  '$ CREATININE 0.97 1.08* 0.93 0.85 0.98  CALCIUM 8.8* 8.2* 8.0* 8.4* 8.4*  MG 2.2 2.3 2.1 2.0 1.9   Liver Function Tests: Recent Labs  Lab 02/17/22 0145 02/18/22 0552 02/19/22 0547  AST 25 48* 34  ALT 19 37 27  ALKPHOS 126 141* 122  BILITOT 0.4 0.8 0.8  PROT 7.4 6.6 6.0*  ALBUMIN 3.7 3.3* 3.0*   Recent Labs  Lab 02/17/22 0145  LIPASE 31   No results for input(s): AMMONIA in the last 168 hours. CBC: Recent Labs  Lab 02/17/22 0145 02/18/22 0552 02/19/22 0547 02/20/22 0604  WBC 13.7* 14.3* 8.6 8.4  NEUTROABS 11.8* 11.8*  --   --   HGB 10.8* 10.1* 9.1* 9.4*  HCT 35.5* 33.9* 32.4* 31.1*  MCV 87.2 89.2 90.5 86.6  PLT 338 306 206 237   Cardiac Enzymes: No results for input(s): CKTOTAL, CKMB, CKMBINDEX, TROPONINI in the last 168 hours. BNP: Invalid input(s): POCBNP CBG: No results for input(s): GLUCAP in the last 168 hours. D-Dimer No results for input(s): DDIMER in the last 72 hours. Hgb A1c No results for input(s): HGBA1C in the last 72 hours. Lipid Profile No results for input(s): CHOL, HDL, LDLCALC, TRIG, CHOLHDL, LDLDIRECT in the last 72 hours. Thyroid function studies No results for  input(s): TSH, T4TOTAL, T3FREE, THYROIDAB in the last 72 hours.  Invalid input(s): FREET3 Anemia work up No results for input(s): VITAMINB12, FOLATE, FERRITIN, TIBC, IRON, RETICCTPCT in the last 72 hours. Urinalysis    Component Value Date/Time   COLORURINE YELLOW 02/17/2022 0300   APPEARANCEUR CLEAR 02/17/2022 0300   APPEARANCEUR Clear 02/11/2022 1035   LABSPEC 1.016 02/17/2022 0300   PHURINE 8.0 02/17/2022 0300   GLUCOSEU NEGATIVE 02/17/2022 0300   HGBUR NEGATIVE 02/17/2022 0300   BILIRUBINUR NEGATIVE 02/17/2022 0300   BILIRUBINUR Negative 02/11/2022 1035   KETONESUR NEGATIVE 02/17/2022 0300   PROTEINUR NEGATIVE 02/17/2022 0300   UROBILINOGEN 0.2 09/22/2007 1157   NITRITE NEGATIVE 02/17/2022 0300   LEUKOCYTESUR NEGATIVE 02/17/2022 0300   Sepsis Labs Invalid input(s): PROCALCITONIN,  WBC,  LACTICIDVEN Microbiology Recent Results (from the past 240 hour(s))  Microscopic Examination     Status: Abnormal   Collection Time: 02/11/22 10:35 AM   Urine  Result Value Ref Range Status   WBC, UA 0-5 0 - 5 /hpf Final   RBC None seen 0 - 2 /hpf Final   Epithelial Cells (non renal) 0-10 0 - 10 /hpf Final   Renal Epithel, UA None seen None seen /hpf Final   Bacteria, UA Few (A) None seen/Few Final  Resp Panel by RT-PCR (Flu A&B, Covid) Nasopharyngeal Swab     Status: None   Collection Time: 02/17/22  4:22 AM   Specimen: Nasopharyngeal Swab; Nasopharyngeal(NP) swabs in vial transport medium  Result Value Ref Range Status   SARS Coronavirus 2 by RT PCR NEGATIVE NEGATIVE Final    Comment: (NOTE) SARS-CoV-2 target nucleic acids are NOT DETECTED.  The SARS-CoV-2 RNA is generally detectable in upper respiratory specimens during the acute phase of infection. The lowest concentration of SARS-CoV-2 viral copies this assay can detect is 138 copies/mL. A negative result does not preclude SARS-Cov-2 infection and should not be used as the  sole basis for treatment or other patient management  decisions. A negative result may occur with  improper specimen collection/handling, submission of specimen other than nasopharyngeal swab, presence of viral mutation(s) within the areas targeted by this assay, and inadequate number of viral copies(<138 copies/mL). A negative result must be combined with clinical observations, patient history, and epidemiological information. The expected result is Negative.  Fact Sheet for Patients:  EntrepreneurPulse.com.au  Fact Sheet for Healthcare Providers:  IncredibleEmployment.be  This test is no t yet approved or cleared by the [place] FDA and  has been authorized for detection and/or diagnosis of SARS-CoV-2 by FDA under an Emergency Use Authorization (EUA). This EUA will remain  in effect (meaning this test can be used) for the duration of the COVID-19 declaration under Section 564(b)(1) of the Act, 21 U.S.C.section 360bbb-3(b)(1), unless the authorization is terminated  or revoked sooner.       Influenza A by PCR NEGATIVE NEGATIVE Final   Influenza B by PCR NEGATIVE NEGATIVE Final    Comment: (NOTE) The Xpert Xpress SARS-CoV-2/FLU/RSV plus assay is intended as an aid in the diagnosis of influenza from Nasopharyngeal swab specimens and should not be used as a sole basis for treatment. Nasal washings and aspirates are unacceptable for Xpert Xpress SARS-CoV-2/FLU/RSV testing.  Fact Sheet for Patients: EntrepreneurPulse.com.au  Fact Sheet for Healthcare Providers: IncredibleEmployment.be  This test is not yet approved or cleared by the [place] FDA and has been authorized for detection and/or diagnosis of SARS-CoV-2 by FDA under an Emergency Use Authorization (EUA). This EUA will remain in effect (meaning this test can be used) for the duration of the COVID-19 declaration under Section 564(b)(1) of the Act, 21 U.S.C. section 360bbb-3(b)(1), unless the  authorization is terminated or revoked.  Performed at [facility], [address], [place], [place] [address]      Time coordinating discharge: 35 minutes  SIGNED:   Rodena Goldmann, DO [facility] 02/21/2022, 9:34 AM  If 7PM-7AM, please contact night-coverage [email or web address]

## 2022-02-22 ENCOUNTER — Telehealth: Payer: Self-pay | Admitting: Family Medicine

## 2022-02-23 ENCOUNTER — Telehealth: Payer: Self-pay

## 2022-02-23 NOTE — Telephone Encounter (Signed)
Transition Care Management Follow-up Telephone Call ?Date of discharge and from where: [facility] 02/21/22 - perforation of duodenum/ duodenitis  ?How have you been since you were released from the hospital? Not much better, very weak and can't eat much ?Any questions or concerns? No ? ?Items Reviewed: ?Did the pt receive and understand the discharge instructions provided? Yes  ?Medications obtained and verified? Yes  ?Other? No  ?Any new allergies since your discharge? No  ?Dietary orders reviewed? Yes ?Do you have support at home? Yes  ? ?Home Care and Equipment/Supplies: ?Were home health services ordered? no ? ?Were any new equipment or medical supplies ordered?  No ? ? ?Functional Questionnaire: (I = Independent and D = Dependent) ?ADLs: I ? ?Bathing/Dressing- I ? ?Meal Prep- I ? ?Eating- I ? ?Maintaining continence- I ? ?Transferring/Ambulation- I ? ?Managing Meds- I ? ?Follow up appointments reviewed: ? ?PCP Hospital f/u appt confirmed? Yes  Scheduled to see Hendricks Limes on 02/26/22 @ 11:05. ?[facility] f/u appt confirmed? Yes  Scheduled to see Hurshel Keys, GI on 03/31/22 @ 3. ?Are transportation arrangements needed? No  ?If their condition worsens, is the pt aware to call PCP or go to the Emergency Dept.? Yes ?Was the patient provided with contact information for the PCP's office or ED? Yes ?Was to pt encouraged to call back with questions or concerns? Yes  ?

## 2022-02-26 ENCOUNTER — Ambulatory Visit (INDEPENDENT_AMBULATORY_CARE_PROVIDER_SITE_OTHER): Payer: PPO | Admitting: Family Medicine

## 2022-02-26 ENCOUNTER — Encounter: Payer: Self-pay | Admitting: Family Medicine

## 2022-02-26 VITALS — BP 151/70 | HR 88 | Temp 95.3°F | Ht 65.0 in | Wt 210.4 lb

## 2022-02-26 DIAGNOSIS — F320 Major depressive disorder, single episode, mild: Secondary | ICD-10-CM

## 2022-02-26 DIAGNOSIS — K631 Perforation of intestine (nontraumatic): Secondary | ICD-10-CM

## 2022-02-26 DIAGNOSIS — Z09 Encounter for follow-up examination after completed treatment for conditions other than malignant neoplasm: Secondary | ICD-10-CM | POA: Diagnosis not present

## 2022-02-26 DIAGNOSIS — F419 Anxiety disorder, unspecified: Secondary | ICD-10-CM | POA: Diagnosis not present

## 2022-02-26 MED ORDER — FLUOXETINE HCL 10 MG PO CAPS
ORAL_CAPSULE | ORAL | 1 refills | Status: DC
Start: 2022-02-26 — End: 2022-03-26

## 2022-02-26 NOTE — Progress Notes (Signed)
? ?Assessment & Plan:  ?1. Perforation of duodenum ([place]) ?Patient to keep scheduled follow-up with GI. Continue current medications. ?- CBC with Differential/Platelet ?- CMP14+EGFR ? ?2. Hospital discharge follow-up ? ?3-4. Anxiety/Major depressive disorder, single episode, mild degree ([place]) ?Uncontrolled. Wean off Lexapro. Start Prozac. Decrease Lexapro to 20 mg (1 tablet) once daily x2 weeks, then decrease to 10 mg (1/2 tablet) daily x2 weeks, then stop. Patient will go ahead and start Prozac 10 mg (1 tablet) daily x2 weeks, then increase to 20 mg (2 tablets) daily.  ?- FLUoxetine (PROZAC) 10 MG capsule; Take 10 mg (1 tablet) by mouth once daily x2 weeks, then increase to 20 mg (2 tablets) daily.  Dispense: 60 capsule; Refill: 1 ? ? ?Return as scheduled. ? ?Hendricks Limes, MSN, APRN, FNP-C ?[place] ? ?Subjective:  ? ? Patient ID: Andrea Martinez, female    DOB: 1946/11/25, 76 y.o.   MRN: [id number] ? ?Patient Care Team: ?Loman Brooklyn, FNP as PCP - General (Family Medicine) ?Derek Jack, MD as Medical Oncologist (Medical Oncology) ?Cassandria Anger, MD as Consulting Physician (Endocrinology) ?Melina Schools, MD as Consulting Physician (Orthopedic Surgery)  ? ?Chief Complaint:  ?Chief Complaint  ?Patient presents with  ? Transitions Of Care  ?  AP 3/12-3/12 Perforation of duodenum. States she is weak since being out of the hospital and loss of appetite.   ? ? ?HPI: ?Andrea Martinez is a 76 y.o. female presenting on 02/26/2022 for Transitions Of Care (AP 3/12-3/12 Perforation of duodenum. States she is weak since being out of the hospital and loss of appetite. ) ? ?Patient was admitted at [facility] 02/17/2022-02/21/2022 with perforation of duodenum.  Patient did not require surgery.  She was treated with IV fluids and Zosyn.  Her PPI infusion was transitioned to oral treatment at discharge.  Over her stay her diet was advanced, which she tolerated well. It was recommended  to follow-up with gastroenterology in 2 to 3 weeks, which is scheduled for 03/31/2022.  She was to continue Augmentin x1 week and continue pantoprazole 40 mg twice daily, which she is doing.  She has Bentyl prescribed as needed for abdominal spasms.  She was also given oxycodone to take as needed for severe pain, which she has taken 1-2 times per day since discharge.  ? ?New complaints: ?Patient is concerned that her depression is getting worse. We discussed this a earlier this month and that we can change her medication; she is now ready for this. ? ?Depression screen [facility] 2/9 02/11/2022 10/30/2021 09/24/2021  ?Decreased Interest 2 0 0  ?Down, Depressed, Hopeless 1 0 0  ?PHQ - 2 Score 3 0 0  ?Altered sleeping 2 0 0  ?Tired, decreased energy 2 0 1  ?Change in appetite 2 0 0  ?Feeling bad or failure about yourself  0 0 0  ?Trouble concentrating 1 0 1  ?Moving slowly or fidgety/restless 0 0 0  ?Suicidal thoughts 0 0 0  ?PHQ-9 Score 10 0 2  ?Difficult doing work/chores Not difficult at all Not difficult at all Not difficult at all  ?Some recent data might be hidden  ? ?GAD 7 : Generalized Anxiety Score 02/11/2022 10/30/2021 09/24/2021 05/12/2021  ?Nervous, Anxious, on Edge 1 0 0 1  ?Control/stop worrying 1 0 1 0  ?Worry too much - different things 1 0 0 0  ?Trouble relaxing 0 0 0 1  ?Restless 0 0 0 0  ?Easily annoyed or irritable 0 0 0  1  ?Afraid - awful might happen 0 0 0 1  ?Total GAD 7 Score 3 0 1 4  ?Anxiety Difficulty Not difficult at all Not difficult at all Not difficult at all Not difficult at all  ? ? ?Social history: ? ?Relevant past medical, surgical, family and social history reviewed and updated as indicated. Interim medical history since our last visit reviewed. ? ?Allergies and medications reviewed and updated. ? ?DATA REVIEWED: CHART IN EPIC ? ?ROS: Negative unless specifically indicated above in HPI.  ? ? ?Current Outpatient Medications:  ?  acetaminophen (TYLENOL) 500 MG tablet, Take 1,000 mg by mouth every  8 (eight) hours as needed for moderate pain., Disp: , Rfl:  ?  amoxicillin-clavulanate (AUGMENTIN) 875-125 MG tablet, Take 1 tablet by mouth 2 (two) times daily for 7 days., Disp: 14 tablet, Rfl: 0 ?  butalbital-acetaminophen-caffeine (FIORICET) 50-325-40 MG tablet, Take 1 tablet by mouth every 6 (six) hours as needed for headache or migraine., Disp: 14 tablet, Rfl: 0 ?  cetirizine (ZYRTEC) 10 MG tablet, Take 10 mg by mouth daily., Disp: , Rfl:  ?  Cholecalciferol (VITAMIN D3) 50 MCG (2000 UT) TABS, Take 2,000 Units by mouth daily in the afternoon., Disp: , Rfl:  ?  dicyclomine (BENTYL) 10 MG capsule, Take 1 capsule (10 mg total) by mouth 3 (three) times daily as needed for spasms., Disp: 30 capsule, Rfl: 1 ?  escitalopram (LEXAPRO) 20 MG tablet, TAKE 1 AND 1/2 TABLETS BY MOUTH DAILY, Disp: 135 tablet, Rfl: 0 ?  lactose free nutrition (BOOST) LIQD, Take 237 mLs by mouth daily., Disp: , Rfl:  ?  levothyroxine (SYNTHROID) 112 MCG tablet, Take 1 tablet (112 mcg total) by mouth daily before breakfast., Disp: 90 tablet, Rfl: 3 ?  Multiple Vitamins-Minerals (CENTRUM SILVER PO), Take 1 tablet by mouth daily in the afternoon., Disp: , Rfl:  ?  nilotinib (TASIGNA) 150 MG capsule, Take 2 capsules (300 mg total) by mouth every 12 (twelve) hours., Disp: 120 capsule, Rfl: 6 ?  ondansetron (ZOFRAN) 4 MG tablet, Take 1 tablet (4 mg total) by mouth every 6 (six) hours as needed for nausea., Disp: 20 tablet, Rfl: 0 ?  oxyCODONE (OXY IR/ROXICODONE) 5 MG immediate release tablet, Take 1 tablet (5 mg total) by mouth every 6 (six) hours as needed for moderate pain, breakthrough pain or severe pain., Disp: 10 tablet, Rfl: 0 ?  pantoprazole (PROTONIX) 40 MG tablet, Take 1 tablet (40 mg total) by mouth 2 (two) times daily., Disp: 30 tablet, Rfl: 1 ?  Probiotic Product (PROBIOTIC DAILY PO), Take 2 tablets by mouth daily in the afternoon., Disp: , Rfl:  ?  sucralfate (CARAFATE) 1 GM/10ML suspension, Take 10 mLs (1 g total) by mouth 4 (four)  times daily -  with meals and at bedtime., Disp: 414 mL, Rfl: 0  ? ?Allergies  ?Allergen Reactions  ? Nucynta [Tapentadol Hcl] Swelling  ? Codeine Nausea Only and Other (See Comments)  ?  Headache  ? Sulfonamide Derivatives Nausea Only and Other (See Comments)  ?  Headache  ? Esomeprazole Magnesium Nausea Only  ? Fluconazole Rash  ?  Redness and blistering on left thigh  ? ?Past Medical History:  ?Diagnosis Date  ? Anxiety   ? denies  ? Arthritis   ? 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 Gastric ulcer 2011  ? EGD, 5/11  ? History of peptic ulcer disease   ? Hypothyroidism   ? not on meds, followed by Dr.  Altheimer  ? Lipoma   ? left upper arm  ? Mixed hyperlipidemia   ? Osteoporosis   ? Pericardial effusion   ? a. HCAP complicated by pericardial effusion requiring pericardial window 10/2016 and large L pleural effusion requring VATS.  ? Pleural effusion   ? a. s/p VATS 2017.  ? Pneumonia 10/2016  ? PONV (postoperative nausea and vomiting)   ? history of  ? Prolonged QT interval   ? Urine incontinence   ? UTI (lower urinary tract infection)   ? frequent  ? Vitamin D deficiency   ?  ?Past Surgical History:  ?Procedure Laterality Date  ? BRAVO [place] STUDY  12/04/2012  ? Procedure: BRAVO [place];  Surgeon: [name], MD;  [facility];  Service: Endoscopy;  Laterality: N/A;  ? CHOLECYSTECTOMY N/A 01/03/2014  ? Procedure: LAPAROSCOPIC CHOLECYSTECTOMY WITH INTRAOPERATIVE CHOLANGIOGRAM;  Surgeon: Joyice Faster. Cornett, MD;  Location: [facility];  Service: General;  Laterality: N/A;  ? COLONOSCOPY  01/2004  ? DR [facility], few small tics  ? ESOPHAGOGASTRODUODENOSCOPY  02/2010  ? gastric ulcers  ? ESOPHAGOGASTRODUODENOSCOPY  12/04/2012  ? WLK:HVFMBBU gastritis (inflammation) was found in the gastric antrum; multiple biopsies The duodenal mucosa showed no abnormalities in the bulb and second portion of the duodenum  ? JOINT REPLACEMENT Right 10/22/2019  ? KYPHOPLASTY N/A  02/14/2019  ? Procedure: KYPHOPLASTY T12 and L1;  Surgeon: Melina Schools, MD;  Location: [place];  Service: Orthopedics;  Laterality: N/A;  120 mins  ? LIPOMA EXCISION  08/02/2011  ? left shoulder  ? NOSE

## 2022-02-26 NOTE — Patient Instructions (Addendum)
Decrease Lexapro to 20 mg (1 tablet) once daily x2 weeks, then decrease to 10 mg (1/2 tablet) daily x2 weeks, then stop.  ?Go ahead and start Prozac 10 mg (1 tablet) daily x2 weeks, then increase to 20 mg (2 tablets) daily.  ?

## 2022-02-27 LAB — CBC WITH DIFFERENTIAL/PLATELET
Basophils Absolute: 0 10*3/uL (ref 0.0–0.2)
Basos: 0 %
EOS (ABSOLUTE): 0.5 10*3/uL — ABNORMAL HIGH (ref 0.0–0.4)
Eos: 6 %
Hematocrit: 33.4 % — ABNORMAL LOW (ref 34.0–46.6)
Hemoglobin: 10.4 g/dL — ABNORMAL LOW (ref 11.1–15.9)
Immature Grans (Abs): 0 10*3/uL (ref 0.0–0.1)
Immature Granulocytes: 0 %
Lymphocytes Absolute: 1.3 10*3/uL (ref 0.7–3.1)
Lymphs: 14 %
MCH: 25.6 pg — ABNORMAL LOW (ref 26.6–33.0)
MCHC: 31.1 g/dL — ABNORMAL LOW (ref 31.5–35.7)
MCV: 82 fL (ref 79–97)
Monocytes Absolute: 0.6 10*3/uL (ref 0.1–0.9)
Monocytes: 6 %
Neutrophils Absolute: 7.1 10*3/uL — ABNORMAL HIGH (ref 1.4–7.0)
Neutrophils: 74 %
Platelets: 292 10*3/uL (ref 150–450)
RBC: 4.07 x10E6/uL (ref 3.77–5.28)
RDW: 13.9 % (ref 11.7–15.4)
WBC: 9.6 10*3/uL (ref 3.4–10.8)

## 2022-02-27 LAB — CMP14+EGFR
ALT: 17 IU/L (ref 0–32)
AST: 25 IU/L (ref 0–40)
Albumin/Globulin Ratio: 1.4 (ref 1.2–2.2)
Albumin: 3.7 g/dL (ref 3.7–4.7)
Alkaline Phosphatase: 164 IU/L — ABNORMAL HIGH (ref 44–121)
BUN/Creatinine Ratio: 10 — ABNORMAL LOW (ref 12–28)
BUN: 9 mg/dL (ref 8–27)
Bilirubin Total: 0.3 mg/dL (ref 0.0–1.2)
CO2: 22 mmol/L (ref 20–29)
Calcium: 9.2 mg/dL (ref 8.7–10.3)
Chloride: 108 mmol/L — ABNORMAL HIGH (ref 96–106)
Creatinine, Ser: 0.89 mg/dL (ref 0.57–1.00)
Globulin, Total: 2.6 g/dL (ref 1.5–4.5)
Glucose: 110 mg/dL — ABNORMAL HIGH (ref 70–99)
Potassium: 4.7 mmol/L (ref 3.5–5.2)
Sodium: 146 mmol/L — ABNORMAL HIGH (ref 134–144)
Total Protein: 6.3 g/dL (ref 6.0–8.5)
eGFR: 67 mL/min/{1.73_m2} (ref >59)

## 2022-02-28 DIAGNOSIS — M1632 Unilateral osteoarthritis resulting from hip dysplasia, left hip: Secondary | ICD-10-CM | POA: Insufficient documentation

## 2022-03-18 ENCOUNTER — Inpatient Hospital Stay (HOSPITAL_COMMUNITY)
Admission: EM | Admit: 2022-03-18 | Discharge: 2022-03-21 | DRG: 871 | Disposition: A | Discharge status: Home / Self Care | Payer: PPO | Attending: Internal Medicine | Admitting: Internal Medicine

## 2022-03-18 ENCOUNTER — Ambulatory Visit: Payer: PPO | Admitting: Internal Medicine

## 2022-03-18 ENCOUNTER — Encounter: Payer: PPO | Admitting: Family Medicine

## 2022-03-18 ENCOUNTER — Emergency Department (HOSPITAL_COMMUNITY): Payer: PPO

## 2022-03-18 ENCOUNTER — Encounter: Payer: Self-pay | Admitting: Internal Medicine

## 2022-03-18 ENCOUNTER — Encounter (HOSPITAL_COMMUNITY): Payer: Self-pay | Admitting: *Deleted

## 2022-03-18 VITALS — BP 102/60 | HR 121 | Temp 97.4°F | Ht 65.0 in | Wt 199.6 lb

## 2022-03-18 DIAGNOSIS — Z823 Family history of stroke: Secondary | ICD-10-CM

## 2022-03-18 DIAGNOSIS — K259 Gastric ulcer, unspecified as acute or chronic, without hemorrhage or perforation: Secondary | ICD-10-CM | POA: Diagnosis not present

## 2022-03-18 DIAGNOSIS — E559 Vitamin D deficiency, unspecified: Secondary | ICD-10-CM | POA: Diagnosis present

## 2022-03-18 DIAGNOSIS — A419 Sepsis, unspecified organism: Secondary | ICD-10-CM | POA: Diagnosis not present

## 2022-03-18 DIAGNOSIS — K449 Diaphragmatic hernia without obstruction or gangrene: Secondary | ICD-10-CM | POA: Diagnosis present

## 2022-03-18 DIAGNOSIS — M810 Age-related osteoporosis without current pathological fracture: Secondary | ICD-10-CM | POA: Diagnosis not present

## 2022-03-18 DIAGNOSIS — Z96653 Presence of artificial knee joint, bilateral: Secondary | ICD-10-CM | POA: Diagnosis not present

## 2022-03-18 DIAGNOSIS — K269 Duodenal ulcer, unspecified as acute or chronic, without hemorrhage or perforation: Secondary | ICD-10-CM

## 2022-03-18 DIAGNOSIS — F419 Anxiety disorder, unspecified: Secondary | ICD-10-CM | POA: Diagnosis present

## 2022-03-18 DIAGNOSIS — Z79890 Hormone replacement therapy (postmenopausal): Secondary | ICD-10-CM | POA: Diagnosis not present

## 2022-03-18 DIAGNOSIS — D72829 Elevated white blood cell count, unspecified: Secondary | ICD-10-CM

## 2022-03-18 DIAGNOSIS — E782 Mixed hyperlipidemia: Secondary | ICD-10-CM | POA: Diagnosis not present

## 2022-03-18 DIAGNOSIS — K2980 Duodenitis without bleeding: Secondary | ICD-10-CM | POA: Diagnosis present

## 2022-03-18 DIAGNOSIS — R531 Weakness: Secondary | ICD-10-CM

## 2022-03-18 DIAGNOSIS — K265 Chronic or unspecified duodenal ulcer with perforation: Secondary | ICD-10-CM | POA: Diagnosis not present

## 2022-03-18 DIAGNOSIS — E039 Hypothyroidism, unspecified: Secondary | ICD-10-CM | POA: Diagnosis present

## 2022-03-18 DIAGNOSIS — R109 Unspecified abdominal pain: Secondary | ICD-10-CM | POA: Diagnosis present

## 2022-03-18 DIAGNOSIS — Z981 Arthrodesis status: Secondary | ICD-10-CM

## 2022-03-18 DIAGNOSIS — Z90711 Acquired absence of uterus with remaining cervical stump: Secondary | ICD-10-CM

## 2022-03-18 DIAGNOSIS — R1013 Epigastric pain: Secondary | ICD-10-CM

## 2022-03-18 DIAGNOSIS — Z801 Family history of malignant neoplasm of trachea, bronchus and lung: Secondary | ICD-10-CM

## 2022-03-18 DIAGNOSIS — Z96641 Presence of right artificial hip joint: Secondary | ICD-10-CM | POA: Diagnosis present

## 2022-03-18 DIAGNOSIS — K3189 Other diseases of stomach and duodenum: Secondary | ICD-10-CM | POA: Diagnosis not present

## 2022-03-18 DIAGNOSIS — K631 Perforation of intestine (nontraumatic): Secondary | ICD-10-CM | POA: Diagnosis not present

## 2022-03-18 DIAGNOSIS — Z79899 Other long term (current) drug therapy: Secondary | ICD-10-CM

## 2022-03-18 DIAGNOSIS — C9210 Chronic myeloid leukemia, BCR/ABL-positive, not having achieved remission: Secondary | ICD-10-CM | POA: Diagnosis present

## 2022-03-18 DIAGNOSIS — Z825 Family history of asthma and other chronic lower respiratory diseases: Secondary | ICD-10-CM

## 2022-03-18 DIAGNOSIS — R1310 Dysphagia, unspecified: Secondary | ICD-10-CM

## 2022-03-18 LAB — COMPREHENSIVE METABOLIC PANEL
ALT: 26 U/L (ref 0–44)
AST: 31 U/L (ref 15–41)
Albumin: 4.2 g/dL (ref 3.5–5.0)
Alkaline Phosphatase: 138 U/L — ABNORMAL HIGH (ref 38–126)
Anion gap: 11 (ref 5–15)
BUN: 27 mg/dL — ABNORMAL HIGH (ref 8–23)
CO2: 21 mmol/L — ABNORMAL LOW (ref 22–32)
Calcium: 8.9 mg/dL (ref 8.9–10.3)
Chloride: 104 mmol/L (ref 98–111)
Creatinine, Ser: 1.08 mg/dL — ABNORMAL HIGH (ref 0.44–1.00)
GFR, Estimated: 53 mL/min — ABNORMAL LOW (ref >60)
Glucose, Bld: 123 mg/dL — ABNORMAL HIGH (ref 70–99)
Potassium: 4.4 mmol/L (ref 3.5–5.1)
Sodium: 136 mmol/L (ref 135–145)
Total Bilirubin: 1.2 mg/dL (ref 0.3–1.2)
Total Protein: 8.3 g/dL — ABNORMAL HIGH (ref 6.5–8.1)

## 2022-03-18 LAB — URINALYSIS, ROUTINE W REFLEX MICROSCOPIC
Bilirubin Urine: NEGATIVE
Glucose, UA: NEGATIVE mg/dL
Hgb urine dipstick: NEGATIVE
Ketones, ur: NEGATIVE mg/dL
Nitrite: NEGATIVE
Protein, ur: NEGATIVE mg/dL
Specific Gravity, Urine: >1.046 — ABNORMAL HIGH (ref 1.005–1.030)
pH: 6 (ref 5.0–8.0)

## 2022-03-18 LAB — CBC
HCT: 39.1 % (ref 36.0–46.0)
Hemoglobin: 12.1 g/dL (ref 12.0–15.0)
MCH: 26.8 pg (ref 26.0–34.0)
MCHC: 30.9 g/dL (ref 30.0–36.0)
MCV: 86.5 fL (ref 80.0–100.0)
Platelets: 366 10*3/uL (ref 150–400)
RBC: 4.52 MIL/uL (ref 3.87–5.11)
RDW: 15.4 % (ref 11.5–15.5)
WBC: 20.6 10*3/uL — ABNORMAL HIGH (ref 4.0–10.5)
nRBC: 0 % (ref 0.0–0.2)

## 2022-03-18 LAB — LIPASE, BLOOD: Lipase: 45 U/L (ref 11–51)

## 2022-03-18 MED ORDER — HYDROMORPHONE HCL 1 MG/ML IJ SOLN
1.0000 mg | Freq: Once | INTRAMUSCULAR | Status: AC
  Administered 2022-03-18: 1 mg via INTRAVENOUS
  Filled 2022-03-18: qty 1

## 2022-03-18 MED ORDER — PIPERACILLIN-TAZOBACTAM 3.375 G IVPB
3.3750 g | Freq: Three times a day (TID) | INTRAVENOUS | Status: DC
  Administered 2022-03-19 – 2022-03-21 (×7): 3.375 g via INTRAVENOUS
  Filled 2022-03-18 (×7): qty 50

## 2022-03-18 MED ORDER — ONDANSETRON HCL 4 MG/2ML IJ SOLN
4.0000 mg | Freq: Once | INTRAMUSCULAR | Status: AC
  Administered 2022-03-18: 4 mg via INTRAVENOUS
  Filled 2022-03-18: qty 2

## 2022-03-18 MED ORDER — ACETAMINOPHEN 650 MG RE SUPP: 650.0000 mg | Freq: Four times a day (QID) | RECTAL | Status: DC | PRN

## 2022-03-18 MED ORDER — PIPERACILLIN-TAZOBACTAM 3.375 G IVPB 30 MIN
3.3750 g | Freq: Once | INTRAVENOUS | Status: AC
Start: 2022-03-18 — End: 2022-03-18
  Administered 2022-03-18: 3.375 g via INTRAVENOUS
  Filled 2022-03-18: qty 50

## 2022-03-18 MED ORDER — ONDANSETRON HCL 4 MG PO TABS: 4.0000 mg | ORAL_TABLET | Freq: Four times a day (QID) | ORAL | Status: DC | PRN

## 2022-03-18 MED ORDER — IOHEXOL 300 MG/ML  SOLN
100.0000 mL | Freq: Once | INTRAMUSCULAR | Status: AC | PRN
  Administered 2022-03-18: 100 mL via INTRAVENOUS

## 2022-03-18 MED ORDER — ACETAMINOPHEN 325 MG PO TABS
650.0000 mg | ORAL_TABLET | Freq: Four times a day (QID) | ORAL | Status: DC | PRN
  Filled 2022-03-18: qty 2

## 2022-03-18 MED ORDER — NILOTINIB HCL 150 MG PO CAPS: 300.0000 mg | ORAL_CAPSULE | Freq: Two times a day (BID) | ORAL | Status: DC

## 2022-03-18 MED ORDER — ONDANSETRON HCL 4 MG/2ML IJ SOLN
4.0000 mg | Freq: Four times a day (QID) | INTRAMUSCULAR | Status: DC | PRN
Start: 2022-03-18 — End: 2022-03-21
  Administered 2022-03-18 – 2022-03-20 (×4): 4 mg via INTRAVENOUS
  Filled 2022-03-18 (×4): qty 2

## 2022-03-18 MED ORDER — PANTOPRAZOLE 80MG IVPB - SIMPLE MED
80.0000 mg | Freq: Once | INTRAVENOUS | Status: AC
Start: 2022-03-18 — End: 2022-03-18
  Administered 2022-03-18: 80 mg via INTRAVENOUS
  Filled 2022-03-18: qty 100

## 2022-03-18 MED ORDER — DEXTROSE-NACL 5-0.9 % IV SOLN: INTRAVENOUS | Status: AC

## 2022-03-18 MED ORDER — MORPHINE SULFATE (PF) 4 MG/ML IV SOLN
4.0000 mg | Freq: Once | INTRAVENOUS | Status: AC
  Administered 2022-03-18: 4 mg via INTRAVENOUS
  Filled 2022-03-18: qty 1

## 2022-03-18 MED ORDER — SODIUM CHLORIDE 0.9 % IV BOLUS
1000.0000 mL | Freq: Once | INTRAVENOUS | Status: AC
  Administered 2022-03-18: 1000 mL via INTRAVENOUS

## 2022-03-18 MED ORDER — HYDROMORPHONE HCL 1 MG/ML IJ SOLN
1.0000 mg | INTRAMUSCULAR | Status: DC | PRN
Start: 2022-03-18 — End: 2022-03-21
  Administered 2022-03-19 – 2022-03-21 (×7): 1 mg via INTRAVENOUS
  Filled 2022-03-18 (×7): qty 1

## 2022-03-18 MED ORDER — PANTOPRAZOLE SODIUM 40 MG IV SOLR
40.0000 mg | Freq: Two times a day (BID) | INTRAVENOUS | Status: DC
  Administered 2022-03-19 – 2022-03-20 (×4): 40 mg via INTRAVENOUS
  Filled 2022-03-18 (×4): qty 10

## 2022-03-18 NOTE — ED Provider Notes (Signed)
?[place] ?Provider Note ? ? ?CSN: [id number] ?Arrival date & time: 03/18/22  1602 ? ?  ? ?History ? ?Chief Complaint  ?Patient presents with  ? Abdominal Pain  ? ? ?Andrea Martinez is a 76 y.o. female. ? ?Pt admitted in March with localized/contained perforation of duodenal ulcer, sent from GI office to ED this afternoon with increased abd pain in past week. States has persistent pain ever since admission last month, and pain continued even at time of discharge, but now worse w in past week, with increased diarrhea. States having 3-5 loose to watery bms per day. No bloody bms. Nausea. No vomiting. No fever or chills. No dysuria. No back/flank pain.  ? ?The history is provided by the patient, medical records and a relative.  ?Abdominal Pain ?Associated symptoms: diarrhea and nausea   ?Associated symptoms: no chest pain, no chills, no cough, no dysuria, no fever, no shortness of breath, no sore throat and no vomiting   ? ?  ? ?Home Medications ?Prior to Admission medications   ?Medication Sig Start Date End Date Taking? Authorizing Provider  ?acetaminophen (TYLENOL) 500 MG tablet Take 1,000 mg by mouth every 8 (eight) hours as needed for moderate pain.    [provider]  ?butalbital-acetaminophen-caffeine (FIORICET) 50-325-40 MG tablet Take 1 tablet by mouth every 6 (six) hours as needed for headache or migraine. 02/21/22   Manuella Ghazi, Pratik D, DO  ?cetirizine (ZYRTEC) 10 MG tablet Take 10 mg by mouth daily.    [provider]  ?Cholecalciferol (VITAMIN D3) 50 MCG (2000 UT) TABS Take 2,000 Units by mouth daily in the afternoon.    [provider]  ?dicyclomine (BENTYL) 10 MG capsule Take 1 capsule (10 mg total) by mouth 3 (three) times daily as needed for spasms. 02/21/22   Manuella Ghazi, Pratik D, DO  ?FLUoxetine (PROZAC) 10 MG capsule Take 10 mg (1 tablet) by mouth once daily x2 weeks, then increase to 20 mg (2 tablets) daily. 02/26/22   Loman Brooklyn, FNP  ?lactose free nutrition  (BOOST) LIQD Take 237 mLs by mouth daily.    [provider]  ?levothyroxine (SYNTHROID) 112 MCG tablet Take 1 tablet (112 mcg total) by mouth daily before breakfast. 10/05/21   Nida, Marella Chimes, MD  ?Multiple Vitamins-Minerals (CENTRUM SILVER PO) Take 1 tablet by mouth daily in the afternoon.    [provider]  ?nilotinib (TASIGNA) 150 MG capsule Take 2 capsules (300 mg total) by mouth every 12 (twelve) hours. 11/16/21   Derek Jack, MD  ?ondansetron (ZOFRAN) 4 MG tablet Take 1 tablet (4 mg total) by mouth every 6 (six) hours as needed for nausea. ?Patient not taking: Reported on 03/18/2022 02/21/22   Heath Lark D, DO  ?oxyCODONE (OXY IR/ROXICODONE) 5 MG immediate release tablet Take 1 tablet (5 mg total) by mouth every 6 (six) hours as needed for moderate pain, breakthrough pain or severe pain. ?Patient not taking: Reported on 03/18/2022 02/21/22   Heath Lark D, DO  ?pantoprazole (PROTONIX) 40 MG tablet Take 1 tablet (40 mg total) by mouth 2 (two) times daily. ?Patient not taking: Reported on 03/18/2022 02/21/22 03/23/22  Heath Lark D, DO  ?Probiotic Product (PROBIOTIC DAILY PO) Take 2 tablets by mouth daily in the afternoon.    [provider]  ?sucralfate (CARAFATE) 1 GM/10ML suspension Take 10 mLs (1 g total) by mouth 4 (four) times daily -  with meals and at bedtime. ?Patient not taking: Reported on 03/18/2022 02/11/22  Loman Brooklyn, FNP  ?   ? ?Allergies    ?Nucynta [tapentadol hcl], Codeine, Sulfonamide derivatives, Esomeprazole magnesium, and Fluconazole   ? ?Review of Systems   ?Review of Systems  ?Constitutional:  Negative for chills and fever.  ?HENT:  Negative for sore throat.   ?Eyes:  Negative for redness.  ?Respiratory:  Negative for cough and shortness of breath.   ?Cardiovascular:  Negative for chest pain.  ?Gastrointestinal:  Positive for abdominal pain, diarrhea and nausea. Negative for vomiting.  ?Genitourinary:  Negative for dysuria and flank pain.   ?Musculoskeletal:  Negative for back pain and neck pain.  ?Skin:  Negative for rash.  ?Neurological:  Negative for headaches.  ?Hematological:  Does not bruise/bleed easily.  ?Psychiatric/Behavioral:  Negative for confusion.   ? ?Physical Exam ?Updated Vital Signs ?BP (!) 140/102 (BP Location: Right Arm)   Pulse 100   Temp 97.7 ?F (36.5 ?C) (Oral)   Resp 20   Ht 1.651 m ('5\' 5"'$ )   Wt 90.3 kg   SpO2 99%   BMI 33.12 kg/m?  ?Physical Exam ?Vitals and nursing note reviewed.  ?Constitutional:   ?   Appearance: Normal appearance. She is well-developed.  ?HENT:  ?   Head: Atraumatic.  ?   Nose: Nose normal.  ?   Mouth/Throat:  ?   Mouth: Mucous membranes are moist.  ?Eyes:  ?   General: No scleral icterus. ?   Conjunctiva/sclera: Conjunctivae normal.  ?Neck:  ?   Trachea: No tracheal deviation.  ?Cardiovascular:  ?   Rate and Rhythm: Normal rate and regular rhythm.  ?   Pulses: Normal pulses.  ?   Heart sounds: Normal heart sounds. No murmur heard. ?  No friction rub. No gallop.  ?Pulmonary:  ?   Effort: Pulmonary effort is normal. No respiratory distress.  ?   Breath sounds: Normal breath sounds.  ?Abdominal:  ?   General: Bowel sounds are normal. There is no distension.  ?   Palpations: Abdomen is soft. There is no mass.  ?   Tenderness: There is abdominal tenderness. There is guarding.  ?   Hernia: No hernia is present.  ?   Comments: Abd tenderness, diffuse, guards.   ?Genitourinary: ?   Comments: No cva tenderness.  ?Musculoskeletal:     ?   General: No swelling or tenderness.  ?   Cervical back: Normal range of motion and neck supple. No rigidity. No muscular tenderness.  ?Skin: ?   General: Skin is warm and dry.  ?   Findings: No rash.  ?Neurological:  ?   Mental Status: She is alert.  ?   Comments: Alert, speech normal.   ?Psychiatric:     ?   Mood and Affect: Mood normal.  ? ? ?ED Results / Procedures / Treatments   ?Labs ?(all labs ordered are listed, but only abnormal results are displayed) ?Results for  orders placed or performed during the hospital encounter of 03/18/22  ?Lipase, blood  ?Result Value Ref Range  ? Lipase 45 11 - 51 U/L  ?Comprehensive metabolic panel  ?Result Value Ref Range  ? Sodium 136 135 - 145 mmol/L  ? Potassium 4.4 3.5 - 5.1 mmol/L  ? Chloride 104 98 - 111 mmol/L  ? CO2 21 (L) 22 - 32 mmol/L  ? Glucose, Bld 123 (H) 70 - 99 mg/dL  ? BUN 27 (H) 8 - 23 mg/dL  ? Creatinine, Ser 1.08 (H) 0.44 - 1.00 mg/dL  ? Calcium  8.9 8.9 - 10.3 mg/dL  ? Total Protein 8.3 (H) 6.5 - 8.1 g/dL  ? Albumin 4.2 3.5 - 5.0 g/dL  ? AST 31 15 - 41 U/L  ? ALT 26 0 - 44 U/L  ? Alkaline Phosphatase 138 (H) 38 - 126 U/L  ? Total Bilirubin 1.2 0.3 - 1.2 mg/dL  ? GFR, Estimated 53 (L) >60 mL/min  ? Anion gap 11 5 - 15  ?CBC  ?Result Value Ref Range  ? WBC 20.6 (H) 4.0 - 10.5 K/uL  ? RBC 4.52 3.87 - 5.11 MIL/uL  ? Hemoglobin 12.1 12.0 - 15.0 g/dL  ? HCT 39.1 36.0 - 46.0 %  ? MCV 86.5 80.0 - 100.0 fL  ? MCH 26.8 26.0 - 34.0 pg  ? MCHC 30.9 30.0 - 36.0 g/dL  ? RDW 15.4 11.5 - 15.5 %  ? Platelets 366 150 - 400 K/uL  ? nRBC 0.0 0.0 - 0.2 %  ? ?CT ABDOMEN PELVIS W CONTRAST ? ?Result Date: 02/17/2022 ?CLINICAL DATA:  Abdominal pain and vomiting. EXAM: CT ABDOMEN AND PELVIS WITH CONTRAST TECHNIQUE: Multidetector CT imaging of the abdomen and pelvis was performed using the standard protocol following bolus administration of intravenous contrast. RADIATION DOSE REDUCTION: This exam was performed according to the departmental dose-optimization program which includes automated exposure control, adjustment of the mA and/or kV according to patient size and/or use of iterative reconstruction technique. CONTRAST:  15m OMNIPAQUE IOHEXOL 300 MG/ML  SOLN COMPARISON:  December 24, 2018 FINDINGS: Lower chest: Stable 5 mm solid noncalcified lung nodule is seen within the lateral aspect of the right lung base. Hepatobiliary: No focal liver abnormality is seen. Status post cholecystectomy. A distal common bile duct is dilated and measures  approximately 0.1 cm. Pancreas: Unremarkable. No pancreatic ductal dilatation or surrounding inflammatory changes. Spleen: Normal in size without focal abnormality. Adrenals/Urinary Tract: No adrenal hemorrhage or renal injury id

## 2022-03-18 NOTE — Assessment & Plan Note (Addendum)
Follows with Dr. Delton Coombes. She is on therapy with nilotinib 300 mg twice daily. ?-Resume ?

## 2022-03-18 NOTE — ED Triage Notes (Signed)
Recurrent abdominal pain. Sent from GI ?

## 2022-03-18 NOTE — Assessment & Plan Note (Addendum)
Abdominal pain.  Afebrile.  But with significant leukocytosis- 20.6.  Rules out for sepsis.  Recent hospitalization for same, surgery was consulted, managed conservatively with antibiotics and PPI.  Completed additional 7-day course of Augmentin, unfortunately PPI prescription was not filled 2/2 misunderstanding between the pharmacy and the patient. ?-CT findings today - persistent 2 cm gas-containing ulcer projecting inferiorly from the duodenal bulb with surrounding soft tissue and fat stranding, not substantially changed from 02/17/2022 CT. Mild reactive wall thickening in the gastric antrum and pylorus, unchanged. No free intraperitoneal air. No fluid collections. ?-IV Zosyn per pharmacy ?-IV Protonix 40 twice daily ?-General surgery consult in the morning ?-N.p.o. except for fluids and meds ?- 1 L bolus given continue D5 N/s 75cc/hr x 1 day ?-IV Dilaudid 1 mg every 4 hourly as needed ?

## 2022-03-18 NOTE — Assessment & Plan Note (Addendum)
Hold nonessential meds for now 

## 2022-03-18 NOTE — Assessment & Plan Note (Deleted)
Hold fluoxetine while n.p.o. ?

## 2022-03-18 NOTE — Progress Notes (Signed)
Pharmacy Antibiotic Note ? ?Andrea Martinez is a 76 y.o. female admitted on 03/18/2022 with perforation of duodenum.  Pharmacy has been consulted for piperacillin/tazobactam  dosing. ? ?Clcr 48 ml/min. Last dose at 8pm over 30 min.  ? ?Plan: ?piperacillin/tazobactam 3.375g Q 8 hr (EI) ?Monitor cultures, clinical status, renal function  ?Narrow abx as able and f/u duration  ? ? ?Height: '5\' 5"'$  (165.1 cm) ?Weight: 90.3 kg (199 lb) ?IBW/kg (Calculated) : 57 ? ?Temp (24hrs), Avg:97.6 ?F (36.4 ?C), Min:97.4 ?F (36.3 ?C), Max:97.7 ?F (36.5 ?C) ? ?Recent Labs  ?Lab 03/18/22 ?1714  ?WBC 20.6*  ?CREATININE 1.08*  ?  ?Estimated Creatinine Clearance: 48.4 mL/min (A) (by C-G formula based on SCr of 1.08 mg/dL (H)).   ? ?Allergies  ?Allergen Reactions  ? Nucynta [Tapentadol Hcl] Swelling  ? Codeine Nausea Only and Other (See Comments)  ?  Headache  ? Sulfonamide Derivatives Nausea Only and Other (See Comments)  ?  Headache  ? Esomeprazole Magnesium Nausea Only  ? Fluconazole Rash  ?  Redness and blistering on left thigh  ? ? ?Antimicrobials this admission: ?Piptazo 4/6 >>  ? ?Microbiology results:  ? ? ? ?Thank you for allowing pharmacy to be a part of this patient?s care. ? ? ?Benetta Spar, PharmD, BCPS, BCCP ?Clinical Pharmacist ? ?Please check AMION for all [place] phone numbers ?After 10:00 PM, call [place] [phone] ? ?

## 2022-03-18 NOTE — H&P (Signed)
?History and Physical  ? ? ?Andrea Martinez ZYS:[id number] DOB: 1945-12-21 DOA: 03/18/2022 ? ?PCP: Loman Brooklyn, FNP  ? ?Patient coming from: Home ? ?I have personally briefly reviewed patient's old medical records in [place] ? ?Chief Complaint: Abdominal pain ? ?HPI: Andrea Martinez is a 76 y.o. female with medical history significant for chronic myeloid leukemia, hypothyroidism depression. ?Patient presented to the ED with complaints of abdominal pain, diarrhea 3-5 loose watery bowel movements daily, and dry heaving. ?Patient was hospitalized 3/8 to 3/12 for similar, CT showed perforated duodenal ulcer.  Patient was managed conservatively with IV antibiotics Zosyn.general surgery was consulted, does no need for operative intervention.  Patient was discharged home to complete 7-day course of Augmentin she was also prescribed Protonix 40 mg twice daily. ?She completed the course of antibiotics, but when she called the pharmacy for the Protonix she was told she was already on a similar medication.  The patient did not fill that prescription.  Has been without PPI since discharge from the hospital. ? ?Patient went to see her gastroenterologist today, she was crying in pain, diaphoretic and tachycardic, she was referred to the ED. ? ?ED Course:  Temp 97.7.  Heart Rate 76-100.  Respiratory 20- 24.  Blood pressure systolic 932- 355.  O2 sat 96 to 99% on room air.  Leukocytosis of 20.6. ?CT abdomen and pelvis-persistent 2 cm gas containing ulcer projecting inferiorly from the duodenal bulb, with surrounding soft tissue and fat stranding, no substantial change from prior CT 3/8. ?Patient was started on IV Zosyn, 1 L bolus given.  Hospitalist admit. ? ?Review of Systems: As per HPI all other systems reviewed and negative. ? ?Past Medical History:  ?Diagnosis Date  ? Anxiety   ? denies  ? Arthritis   ? Chronic myelogenous leukemia (CML), BCR-ABL1-positive ([place]) 11/20/2015  ? Gastric ulcer 2011  ? EGD, 5/11  ? History of  peptic ulcer disease   ? Hypothyroidism   ? not on meds, followed by Dr. Elyse Hsu  ? Lipoma   ? left upper arm  ? Mixed hyperlipidemia   ? Osteoporosis   ? Pericardial effusion   ? a. HCAP complicated by pericardial effusion requiring pericardial window 10/2016 and large L pleural effusion requring VATS.  ? Pleural effusion   ? a. s/p VATS 2017.  ? Pneumonia 10/2016  ? PONV (postoperative nausea and vomiting)   ? history of  ? Prolonged QT interval   ? Urine incontinence   ? UTI (lower urinary tract infection)   ? frequent  ? Vitamin D deficiency   ? ? ?Past Surgical History:  ?Procedure Laterality Date  ? BRAVO [place] STUDY  12/04/2012  ? Procedure: BRAVO [place];  Surgeon: [name], MD;  [facility];  Service: Endoscopy;  Laterality: N/A;  ? CHOLECYSTECTOMY N/A 01/03/2014  ? Procedure: LAPAROSCOPIC CHOLECYSTECTOMY WITH INTRAOPERATIVE CHOLANGIOGRAM;  Surgeon: Joyice Faster. Cornett, MD;  Location: [place];  Service: General;  Laterality: N/A;  ? COLONOSCOPY  01/2004  ? DR [facility], few small tics  ? ESOPHAGOGASTRODUODENOSCOPY  02/2010  ? gastric ulcers  ? ESOPHAGOGASTRODUODENOSCOPY  12/04/2012  ? [id number] gastritis (inflammation) was found in the gastric antrum; multiple biopsies The duodenal mucosa showed no abnormalities in the bulb and second portion of the duodenum  ? JOINT REPLACEMENT Right 10/22/2019  ? KYPHOPLASTY N/A 02/14/2019  ? Procedure: KYPHOPLASTY T12 and L1;  Surgeon: Melina Schools, MD;  Location: [place];  Service: Orthopedics;  Laterality: N/A;  120 mins  ? LIPOMA EXCISION  08/02/2011  ? left shoulder  ? NOSE SURGERY    ? PARTIAL HYSTERECTOMY    ? vaginal at age 54 years of age  ? TOE DEBRIDEMENT Left 1962  ? lt great toe  ? TOTAL HIP ARTHROPLASTY Right 11/18/2021  ? Procedure: TOTAL HIP ARTHROPLASTY ANTERIOR APPROACH;  Surgeon: Gaynelle Arabian, MD;  [facility];  Service: Orthopedics;  Laterality: Right;  ? TOTAL KNEE ARTHROPLASTY Right 10/22/2019  ? Procedure: TOTAL KNEE  ARTHROPLASTY;  Surgeon: Gaynelle Arabian, MD;  [facility];  Service: Orthopedics;  Laterality: Right;  43mn  ? TOTAL KNEE ARTHROPLASTY Left 03/03/2020  ? Procedure: TOTAL KNEE ARTHROPLASTY;  Surgeon: AGaynelle Arabian MD;  [facility];  Service: Orthopedics;  Laterality: Left;  57m  ? TRANSFORAMINAL LUMBAR INTERBODY FUSION (TLIF) WITH PEDICLE SCREW FIXATION 1 LEVEL N/A 01/28/2021  ? Procedure: TRANSFORAMINAL LUMBAR INTERBODY FUSION (TLIF) LUMBAR FIVE-SACRAL ONE;  Surgeon: BrMelina SchoolsMD;  [facility] Service: Orthopedics;  Laterality: N/A;  4 hrs  ? VIDEO ASSISTED THORACOSCOPY (VATS)/EMPYEMA Left 10/25/2016  ? Procedure: VIDEO ASSISTED THORACOSCOPY (VATS), BRONCH,DRAINAGE OF PLEURAL EFFUSION,PERICARDIAL WINDOW WITH DRAINAGE OF PERICARDIAL FLUID, TEE;  Surgeon: StMelrose NakayamaMD;  [place] Service: Thoracic;  Laterality: Left;  ? VIDEO BRONCHOSCOPY N/A 10/25/2016  ? Procedure: VIDEO BRONCHOSCOPY;  Surgeon: StMelrose NakayamaMD;  [facility] Service: Thoracic;  Laterality: N/A;  ? ? ? reports that she has never smoked. She has never used smokeless tobacco. She reports that she does not drink alcohol and does not use drugs. ? ?Allergies  ?Allergen Reactions  ? Nucynta [Tapentadol Hcl] Swelling  ? Codeine Nausea Only and Other (See Comments)  ?  Headache  ? Sulfonamide Derivatives Nausea Only and Other (See Comments)  ?  Headache  ? Esomeprazole Magnesium Nausea Only  ? Fluconazole Rash  ?  Redness and blistering on left thigh  ? ? ?Family History  ?Problem Relation Age of Onset  ? Lung cancer Father   ? COPD Mother   ? Stroke Mother   ? Thyroid disease Mother   ? Colon cancer Neg Hx   ? Anesthesia problems Neg Hx   ? Hypotension Neg Hx   ? Malignant hyperthermia Neg Hx   ? Pseudochol deficiency Neg Hx   ? ? ?Prior to Admission medications   ?Medication Sig Start Date End Date Taking? Authorizing Provider  ?acetaminophen (TYLENOL) 500 MG tablet Take 1,000 mg by mouth every 8  (eight) hours as needed for moderate pain.   Yes [provider]  ?alum & mag hydroxide-simeth (MAALOX/MYLANTA) 200-200-20 MG/5ML suspension Take 30 mLs by mouth every 6 (six) hours as needed for indigestion or heartburn.   Yes [provider]  ?calcium carbonate (TUMS - DOSED IN MG ELEMENTAL CALCIUM) 500 MG chewable tablet Chew 1 tablet by mouth daily.   Yes [provider]  ?cetirizine (ZYRTEC) 10 MG tablet Take 10 mg by mouth daily.   Yes [provider]  ?Cholecalciferol (VITAMIN D3) 50 MCG (2000 UT) TABS Take 2,000 Units by mouth daily in the afternoon.   Yes [provider]  ?lactose free nutrition (BOOST) LIQD Take 237 mLs by mouth daily.   Yes [provider]  ?levothyroxine (SYNTHROID) 112 MCG tablet Take 1 tablet (112 mcg total) by mouth daily before breakfast. 10/05/21  Yes Nida, GeMarella ChimesMD  ?loperamide (IMODIUM A-D) 2 MG tablet Take 2 mg by  mouth 4 (four) times daily as needed for diarrhea or loose stools.   Yes [provider]  ?Multiple Vitamins-Minerals (CENTRUM SILVER PO) Take 1 tablet by mouth daily in the afternoon.   Yes [provider]  ?nilotinib (TASIGNA) 150 MG capsule Take 2 capsules (300 mg total) by mouth every 12 (twelve) hours. 11/16/21  Yes Derek Jack, MD  ?Probiotic Product (PROBIOTIC DAILY PO) Take 2 tablets by mouth daily in the afternoon.   Yes [provider]  ?butalbital-acetaminophen-caffeine (FIORICET) 50-325-40 MG tablet Take 1 tablet by mouth every 6 (six) hours as needed for headache or migraine. ?Patient not taking: Reported on 03/18/2022 02/21/22   Heath Lark D, DO  ?dicyclomine (BENTYL) 10 MG capsule Take 1 capsule (10 mg total) by mouth 3 (three) times daily as needed for spasms. ?Patient not taking: Reported on 03/18/2022 02/21/22   Heath Lark D, DO  ?FLUoxetine (PROZAC) 10 MG capsule Take 10 mg (1 tablet) by mouth once daily x2 weeks, then increase to 20 mg (2 tablets) daily.  02/26/22   Loman Brooklyn, FNP  ?ondansetron (ZOFRAN) 4 MG tablet Take 1 tablet (4 mg total) by mouth every 6 (six) hours as needed for nausea. ?Patient not taking: Reported on 03/18/2022 02/21/22   Sh

## 2022-03-18 NOTE — Progress Notes (Signed)
? ? ?Referring Provider: Loman Brooklyn, FNP ?Primary Care Physician:  Loman Brooklyn, FNP ?Primary GI:  Dr. Abbey Chatters ? ?Chief Complaint  ?Patient presents with  ? Follow-up  ?  ER stay and the pt states she is no better from when she left the ER  ? ? ?HPI:   ?Andrea Martinez is a 76 y.o. female who presents to clinic today for hospital follow-up.  History of CML, anxiety, peptic ulcer disease, hypothyroidism, dyslipidemia, osteoporosis, presented to [facility] 02/17/2022 with abdominal pain. ? ?CT abdomen pelvis with contrast showed severe duodenitis in the proximal duodenum with features consistent with contained perforation and acute gastritis.  She was treated conservatively with IV antibiotics.  Discharged 02/21/2022 on 7 more days of p.o. antibiotics.  Pantoprazole twice daily sent to her pharmacy.  She has not been taking this medication.  She states that the pharmacy called her and said she was already on a medication similar to this so she told them did not fill it. ? ?States since her discharge she has been miserable.  Ongoing abdominal pain.  Has been using Mylanta and Tums regularly.  Poor p.o. intake.  Notes generalized weakness/fatigue.  No melena hematochezia.  She is crying in distress for me today. ? ?Past Medical History:  ?Diagnosis Date  ? Anxiety   ? denies  ? Arthritis   ? Chronic myelogenous leukemia (CML), BCR-ABL1-positive ([place]) 11/20/2015  ? Gastric ulcer 2011  ? EGD, 5/11  ? History of peptic ulcer disease   ? Hypothyroidism   ? not on meds, followed by Dr. Elyse Hsu  ? Lipoma   ? left upper arm  ? Mixed hyperlipidemia   ? Osteoporosis   ? Pericardial effusion   ? a. HCAP complicated by pericardial effusion requiring pericardial window 10/2016 and large L pleural effusion requring VATS.  ? Pleural effusion   ? a. s/p VATS 2017.  ? Pneumonia 10/2016  ? PONV (postoperative nausea and vomiting)   ? history of  ? Prolonged QT interval   ? Urine incontinence   ? UTI (lower urinary tract  infection)   ? frequent  ? Vitamin D deficiency   ? ? ?Past Surgical History:  ?Procedure Laterality Date  ? BRAVO [place] STUDY  12/04/2012  ? Procedure: BRAVO [place];  Surgeon: [name], MD;  [facility];  Service: Endoscopy;  Laterality: N/A;  ? CHOLECYSTECTOMY N/A 01/03/2014  ? Procedure: LAPAROSCOPIC CHOLECYSTECTOMY WITH INTRAOPERATIVE CHOLANGIOGRAM;  Surgeon: Joyice Faster. Cornett, MD;  Location: [place];  Service: General;  Laterality: N/A;  ? COLONOSCOPY  01/2004  ? DR [facility], few small tics  ? ESOPHAGOGASTRODUODENOSCOPY  02/2010  ? gastric ulcers  ? ESOPHAGOGASTRODUODENOSCOPY  12/04/2012  ? [id number] gastritis (inflammation) was found in the gastric antrum; multiple biopsies The duodenal mucosa showed no abnormalities in the bulb and second portion of the duodenum  ? JOINT REPLACEMENT Right 10/22/2019  ? KYPHOPLASTY N/A 02/14/2019  ? Procedure: KYPHOPLASTY T12 and L1;  Surgeon: Melina Schools, MD;  Location: [place];  Service: Orthopedics;  Laterality: N/A;  120 mins  ? LIPOMA EXCISION  08/02/2011  ? left shoulder  ? NOSE SURGERY    ? PARTIAL HYSTERECTOMY    ? vaginal at age 38 years of age  ? TOE DEBRIDEMENT Left 1962  ? lt great toe  ? TOTAL HIP ARTHROPLASTY Right 11/18/2021  ? Procedure: TOTAL HIP ARTHROPLASTY ANTERIOR APPROACH;  Surgeon: Gaynelle Arabian, MD;  [facility];  Service: Orthopedics;  Laterality: Right;  ? TOTAL KNEE ARTHROPLASTY Right 10/22/2019  ? Procedure: TOTAL KNEE ARTHROPLASTY;  Surgeon: Gaynelle Arabian, MD;  [facility];  Service: Orthopedics;  Laterality: Right;  8mn  ? TOTAL KNEE ARTHROPLASTY Left 03/03/2020  ? Procedure: TOTAL KNEE ARTHROPLASTY;  Surgeon: AGaynelle Arabian MD;  [facility];  Service: Orthopedics;  Laterality: Left;  530m  ? TRANSFORAMINAL LUMBAR INTERBODY FUSION (TLIF) WITH PEDICLE SCREW FIXATION 1 LEVEL N/A 01/28/2021  ? Procedure: TRANSFORAMINAL LUMBAR INTERBODY FUSION (TLIF) LUMBAR FIVE-SACRAL ONE;  Surgeon: BrMelina SchoolsMD;   [place] [facility] Service: Orthopedics;  Laterality: N/A;  4 hrs  ? VIDEO ASSISTED THORACOSCOPY (VATS)/EMPYEMA Left 10/25/2016  ? Procedure: VIDEO ASSISTED THORACOSCOPY (VATS), BRONCH,DRAINAGE OF PLEURAL EFFUSION,PERICARDIAL WINDOW WITH DRAINAGE OF PERICARDIAL FLUID, TEE;  Surgeon: StMelrose NakayamaMD;  [place] Service: Thoracic;  Laterality: Left;  ? VIDEO BRONCHOSCOPY N/A 10/25/2016  ? Procedure: VIDEO BRONCHOSCOPY;  Surgeon: StMelrose NakayamaMD;  [place] Service: Thoracic;  Laterality: N/A;  ? ? ?Current Outpatient Medications  ?Medication Sig Dispense Refill  ? acetaminophen (TYLENOL) 500 MG tablet Take 1,000 mg by mouth every 8 (eight) hours as needed for moderate pain.    ? butalbital-acetaminophen-caffeine (FIORICET) 50-325-40 MG tablet Take 1 tablet by mouth every 6 (six) hours as needed for headache or migraine. 14 tablet 0  ? cetirizine (ZYRTEC) 10 MG tablet Take 10 mg by mouth daily.    ? Cholecalciferol (VITAMIN D3) 50 MCG (2000 UT) TABS Take 2,000 Units by mouth daily in the afternoon.    ? dicyclomine (BENTYL) 10 MG capsule Take 1 capsule (10 mg total) by mouth 3 (three) times daily as needed for spasms. 30 capsule 1  ? FLUoxetine (PROZAC) 10 MG capsule Take 10 mg (1 tablet) by mouth once daily x2 weeks, then increase to 20 mg (2 tablets) daily. 60 capsule 1  ? lactose free nutrition (BOOST) LIQD Take 237 mLs by mouth daily.    ? levothyroxine (SYNTHROID) 112 MCG tablet Take 1 tablet (112 mcg total) by mouth daily before breakfast. 90 tablet 3  ? Multiple Vitamins-Minerals (CENTRUM SILVER PO) Take 1 tablet by mouth daily in the afternoon.    ? nilotinib (TASIGNA) 150 MG capsule Take 2 capsules (300 mg total) by mouth every 12 (twelve) hours. 120 capsule 6  ? Probiotic Product (PROBIOTIC DAILY PO) Take 2 tablets by mouth daily in the afternoon.    ? ondansetron (ZOFRAN) 4 MG tablet Take 1 tablet (4 mg total) by mouth every 6 (six) hours as needed for nausea. (Patient not  taking: Reported on 03/18/2022) 20 tablet 0  ? oxyCODONE (OXY IR/ROXICODONE) 5 MG immediate release tablet Take 1 tablet (5 mg total) by mouth every 6 (six) hours as needed for moderate pain, breakthrough pain or severe pain. (Patient not taking: Reported on 03/18/2022) 10 tablet 0  ? pantoprazole (PROTONIX) 40 MG tablet Take 1 tablet (40 mg total) by mouth 2 (two) times daily. (Patient not taking: Reported on 03/18/2022) 30 tablet 1  ? sucralfate (CARAFATE) 1 GM/10ML suspension Take 10 mLs (1 g total) by mouth 4 (four) times daily -  with meals and at bedtime. (Patient not taking: Reported on 03/18/2022) 414 mL 0  ? ?No current facility-administered medications for this visit.  ? ? ?Allergies as of 03/18/2022 - Review Complete 03/18/2022  ?Allergen Reaction Noted  ? Nucynta [tapentadol hcl] Swelling 12/31/2020  ? Codeine Nausea Only and Other (See Comments)  02/11/2010  ? Sulfonamide derivatives Nausea Only and Other (See Comments) 02/11/2010  ? Esomeprazole magnesium Nausea Only 05/20/2010  ? Fluconazole Rash 12/10/2015  ? ? ?Family History  ?Problem Relation Age of Onset  ? Lung cancer Father   ? COPD Mother   ? Stroke Mother   ? Thyroid disease Mother   ? Colon cancer Neg Hx   ? Anesthesia problems Neg Hx   ? Hypotension Neg Hx   ? Malignant hyperthermia Neg Hx   ? Pseudochol deficiency Neg Hx   ? ? ?Social History  ? ?Socioeconomic History  ? Marital status: Married  ?  Spouse name: Not on file  ? Number of children: 2  ? Years of education: 12th grade  ? Highest education level: Not on file  ?Occupational History  ? Occupation: retired  ?  Employer: UENPLOYED  ?  Employer: RETIRED  ?Tobacco Use  ? Smoking status: Never  ? Smokeless tobacco: Never  ?Vaping Use  ? Vaping Use: Never used  ?Substance and Sexual Activity  ? Alcohol use: No  ?  Alcohol/week: 0.0 standard drinks  ? Drug use: No  ? Sexual activity: Yes  ?  Partners: Male  ?  Birth control/protection: None  ?Other Topics Concern  ? Not on file  ?Social History  Narrative  ? Not on file  ? ?Social Determinants of Health  ? ?Financial Resource Strain: Low Risk   ? Difficulty of Paying Living Expenses: Not hard at all  ?Food Insecurity: No Food Insecurity  ? [place]

## 2022-03-19 ENCOUNTER — Other Ambulatory Visit: Payer: Self-pay

## 2022-03-19 ENCOUNTER — Encounter (HOSPITAL_COMMUNITY): Payer: Self-pay | Admitting: Internal Medicine

## 2022-03-19 DIAGNOSIS — K631 Perforation of intestine (nontraumatic): Secondary | ICD-10-CM | POA: Diagnosis not present

## 2022-03-19 LAB — CBC
HCT: 34.3 % — ABNORMAL LOW (ref 36.0–46.0)
Hemoglobin: 10.2 g/dL — ABNORMAL LOW (ref 12.0–15.0)
MCH: 26.6 pg (ref 26.0–34.0)
MCHC: 29.7 g/dL — ABNORMAL LOW (ref 30.0–36.0)
MCV: 89.6 fL (ref 80.0–100.0)
Platelets: 249 10*3/uL (ref 150–400)
RBC: 3.83 MIL/uL — ABNORMAL LOW (ref 3.87–5.11)
RDW: 15.7 % — ABNORMAL HIGH (ref 11.5–15.5)
WBC: 13.5 10*3/uL — ABNORMAL HIGH (ref 4.0–10.5)
nRBC: 0 % (ref 0.0–0.2)

## 2022-03-19 LAB — BASIC METABOLIC PANEL
Anion gap: 7 (ref 5–15)
BUN: 25 mg/dL — ABNORMAL HIGH (ref 8–23)
CO2: 24 mmol/L (ref 22–32)
Calcium: 8.2 mg/dL — ABNORMAL LOW (ref 8.9–10.3)
Chloride: 108 mmol/L (ref 98–111)
Creatinine, Ser: 1.03 mg/dL — ABNORMAL HIGH (ref 0.44–1.00)
GFR, Estimated: 56 mL/min — ABNORMAL LOW (ref >60)
Glucose, Bld: 121 mg/dL — ABNORMAL HIGH (ref 70–99)
Potassium: 4.9 mmol/L (ref 3.5–5.1)
Sodium: 139 mmol/L (ref 135–145)

## 2022-03-19 LAB — PROCALCITONIN: Procalcitonin: 0.1 ng/mL

## 2022-03-19 MED ORDER — SUCRALFATE 1 GM/10ML PO SUSP
1.0000 g | Freq: Three times a day (TID) | ORAL | Status: DC
  Administered 2022-03-19 – 2022-03-21 (×4): 1 g via ORAL
  Filled 2022-03-19 (×5): qty 10

## 2022-03-19 MED ORDER — BUTALBITAL-APAP-CAFFEINE 50-325-40 MG PO TABS
1.0000 | ORAL_TABLET | Freq: Four times a day (QID) | ORAL | Status: DC | PRN
  Administered 2022-03-20 – 2022-03-21 (×3): 1 via ORAL
  Filled 2022-03-19 (×3): qty 1

## 2022-03-19 MED ORDER — ACETAMINOPHEN 650 MG RE SUPP
650.0000 mg | Freq: Four times a day (QID) | RECTAL | Status: DC | PRN
Start: 2022-03-19 — End: 2022-03-21

## 2022-03-19 MED ORDER — ACETAMINOPHEN 325 MG PO TABS
650.0000 mg | ORAL_TABLET | Freq: Four times a day (QID) | ORAL | Status: DC | PRN
  Administered 2022-03-19: 650 mg via ORAL

## 2022-03-19 NOTE — TOC Progression Note (Signed)
?  Transition of Care (TOC) Screening Note ? ? ?Patient Details  ?Name: Andrea Martinez ?Date of Birth: 09-28-1946 ? ? ?Transition of Care (TOC) CM/SW Contact:    ?Boneta Lucks, RN ?Phone Number: ?03/19/2022, 12:52 PM ? ? ? ?Transition of Care Department [facility]) has reviewed patient and no TOC needs have been identified at this time. We will continue to monitor patient advancement through interdisciplinary progression rounds. If new patient transition needs arise, please place a TOC consult. ? ? ?Barriers to Discharge: Continued Medical Work up ? ?   ?

## 2022-03-19 NOTE — Consult Note (Addendum)
[facility] Consult ? ?Reason for Consult: Contained perforation duodenum since 02/17/2022  ?Referring Physician: Dr. Avon Gully Dr. Abbey Chatters  ? ?Chief Complaint   ?Abdominal Pain ?  ? ? ?HPI: Andrea Martinez is a 76 y.o. female with a known duodenal posterior ulcer perforation that is contained and is about 2cm and is stable since March 2023 when she was admitted. She did not get her PPI filled per report but did finish her antibiotics. 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 She was admitted and started on antibiotics last night after being seen by Dr. Abbey Chatters in the office and being tachycardic and having pain. She is also complaining of diarrhea.  ? ?Past Medical History:  ?Diagnosis Date  ? Anxiety   ? denies  ? Arthritis   ? 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 Gastric ulcer 2011  ? EGD, 5/11  ? History of peptic ulcer disease   ? Hypothyroidism   ? not on meds, followed by Dr. Elyse Hsu  ? Lipoma   ? left upper arm  ? Mixed hyperlipidemia   ? Osteoporosis   ? Pericardial effusion   ? a. HCAP complicated by pericardial effusion requiring pericardial window 10/2016 and large L pleural effusion requring VATS.  ? Pleural effusion   ? a. s/p VATS 2017.  ? Pneumonia 10/2016  ? PONV (postoperative nausea and vomiting)   ? history of  ? Prolonged QT interval   ? Urine incontinence   ? UTI (lower urinary tract infection)   ? frequent  ? Vitamin D deficiency   ? ? ?Past Surgical History:  ?Procedure Laterality Date  ? BRAVO [place] STUDY  12/04/2012  ? Procedure: BRAVO [place];  Surgeon: [name], MD;  [facility];  Service: Endoscopy;  Laterality: N/A;  ? CHOLECYSTECTOMY N/A 01/03/2014  ? Procedure: LAPAROSCOPIC CHOLECYSTECTOMY WITH INTRAOPERATIVE CHOLANGIOGRAM;  Surgeon: Joyice Faster. Cornett, MD;  Location: [place];  Service: General;  Laterality: N/A;  ? COLONOSCOPY  01/2004  ? DR [facility], few small tics  ?  ESOPHAGOGASTRODUODENOSCOPY  02/2010  ? gastric ulcers  ? ESOPHAGOGASTRODUODENOSCOPY  12/04/2012  ? VPX:TGGYIRS gastritis (inflammation) was found in the gastric antrum; multiple biopsies The duodenal mucosa showed no abnormalities in the bulb and second portion of the duodenum  ? JOINT REPLACEMENT Right 10/22/2019  ? KYPHOPLASTY N/A 02/14/2019  ? Procedure: KYPHOPLASTY T12 and L1;  Surgeon: Melina Schools, MD;  Location: [place];  Service: Orthopedics;  Laterality: N/A;  120 mins  ? LIPOMA EXCISION  08/02/2011  ? left shoulder  ? NOSE SURGERY    ? PARTIAL HYSTERECTOMY    ? vaginal at age 24 years of age  ? TOE DEBRIDEMENT Left 1962  ? lt great toe  ? TOTAL HIP ARTHROPLASTY Right 11/18/2021  ? Procedure: TOTAL HIP ARTHROPLASTY ANTERIOR APPROACH;  Surgeon: Gaynelle Arabian, MD;  Location: [facility];  Service: Orthopedics;  Laterality: Right;  ? TOTAL KNEE ARTHROPLASTY Right 10/22/2019  ? Procedure: TOTAL KNEE ARTHROPLASTY;  Surgeon: Gaynelle Arabian, MD;  Location: [facility];  Service: Orthopedics;  Laterality: Right;  54mn  ? TOTAL KNEE ARTHROPLASTY Left 03/03/2020  ? Procedure: TOTAL KNEE ARTHROPLASTY;  Surgeon: AGaynelle Arabian MD;  Location: [facility];  Service: Orthopedics;  Laterality: Left;  511m  ? TRANSFORAMINAL LUMBAR INTERBODY FUSION (TLIF) WITH PEDICLE SCREW FIXATION 1 LEVEL N/A 01/28/2021  ? Procedure: TRANSFORAMINAL LUMBAR INTERBODY FUSION (TLIF) LUMBAR FIVE-SACRAL ONE;  Surgeon: BrMelina SchoolsMD;  Location: [place];  Service: Orthopedics;  Laterality: N/A;  4 hrs  ? VIDEO ASSISTED THORACOSCOPY (VATS)/EMPYEMA Left 10/25/2016  ? Procedure: VIDEO ASSISTED THORACOSCOPY (VATS), BRONCH,DRAINAGE OF PLEURAL EFFUSION,PERICARDIAL WINDOW WITH DRAINAGE OF PERICARDIAL FLUID, TEE;  Surgeon: [name], MD;  Location: [place];  Service: Thoracic;  Laterality: Left;  ? VIDEO BRONCHOSCOPY N/A 10/25/2016  ? Procedure: VIDEO BRONCHOSCOPY;  Surgeon: [name], MD;  Location: [place];  Service: Thoracic;  Laterality: N/A;   ? ? ?Family History  ?Problem Relation Age of Onset  ? Lung cancer Father   ? COPD Mother   ? Stroke Mother   ? Thyroid disease Mother   ? Colon cancer Neg Hx   ? Anesthesia problems Neg Hx   ? Hypotension Neg Hx   ? Malignant hyperthermia Neg Hx   ? Pseudochol deficiency Neg Hx   ? ? ?Social History  ? ?Tobacco Use  ? Smoking status: Never  ? Smokeless tobacco: Never  ?Vaping Use  ? Vaping Use: Never used  ?Substance Use Topics  ? Alcohol use: No  ?  Alcohol/week: 0.0 standard drinks  ? Drug use: No  ? ? ?Medications: I have reviewed the patient's current medications. ?Prior to Admission:  ?Medications Prior to Admission  ?Medication Sig Dispense Refill Last Dose  ? acetaminophen (TYLENOL) 500 MG tablet Take 1,000 mg by mouth every 8 (eight) hours as needed for moderate pain.   03/18/2022  ? alum & mag hydroxide-simeth (MAALOX/MYLANTA) 200-200-20 MG/5ML suspension Take 30 mLs by mouth every 6 (six) hours as needed for indigestion or heartburn.   03/18/2022  ? calcium carbonate (TUMS - DOSED IN MG ELEMENTAL CALCIUM) 500 MG chewable tablet Chew 1 tablet by mouth daily.   03/17/2022  ? cetirizine (ZYRTEC) 10 MG tablet Take 10 mg by mouth daily.   03/17/2022  ? Cholecalciferol (VITAMIN D3) 50 MCG (2000 UT) TABS Take 2,000 Units by mouth daily in the afternoon.   03/17/2022  ? lactose free nutrition (BOOST) LIQD Take 237 mLs by mouth daily.   03/18/2022  ? levothyroxine (SYNTHROID) 112 MCG tablet Take 1 tablet (112 mcg total) by mouth daily before breakfast. 90 tablet 3 03/18/2022  ? loperamide (IMODIUM A-D) 2 MG tablet Take 2 mg by mouth 4 (four) times daily as needed for diarrhea or loose stools.   03/18/2022  ? Multiple Vitamins-Minerals (CENTRUM SILVER PO) Take 1 tablet by mouth daily in the afternoon.   03/17/2022  ? nilotinib (TASIGNA) 150 MG capsule Take 2 capsules (300 mg total) by mouth every 12 (twelve) hours. 120 capsule 6 03/18/2022  ? Probiotic Product (PROBIOTIC DAILY PO) Take 2 tablets by mouth daily in the afternoon.    03/17/2022  ? butalbital-acetaminophen-caffeine (FIORICET) 50-325-40 MG tablet Take 1 tablet by mouth every 6 (six) hours as needed for headache or migraine. (Patient not taking: Reported on 03/18/2022) 14 tablet 0 Not Taking  ? dicyclomine (BENTYL) 10 MG capsule Take 1 capsule (10 mg total) by mouth 3 (three) times daily as needed for spasms. (Patient not taking: Reported on 03/18/2022) 30 capsule 1 Not Taking  ? FLUoxetine (PROZAC) 10 MG capsule Take 10 mg (1 tablet) by mouth once daily x2 weeks, then increase to 20 mg (2 tablets) daily. 60 capsule 1   ? ondansetron (ZOFRAN) 4 MG tablet Take 1 tablet (4 mg total) by mouth every 6 (six) hours as needed for nausea. (Patient not taking: Reported on 03/18/2022) 20 tablet 0 Not Taking  ? oxyCODONE (OXY  IR/ROXICODONE) 5 MG immediate release tablet Take 1 tablet (5 mg total) by mouth every 6 (six) hours as needed for moderate pain, breakthrough pain or severe pain. (Patient not taking: Reported on 03/18/2022) 10 tablet 0 Not Taking  ? pantoprazole (PROTONIX) 40 MG tablet Take 1 tablet (40 mg total) by mouth 2 (two) times daily. (Patient not taking: Reported on 03/18/2022) 30 tablet 1 Not Taking  ? sucralfate (CARAFATE) 1 GM/10ML suspension Take 10 mLs (1 g total) by mouth 4 (four) times daily -  with meals and at bedtime. (Patient not taking: Reported on 03/18/2022) 414 mL 0 Not Taking  ? ?Scheduled: ? pantoprazole (PROTONIX) IV  40 mg Intravenous Q12H  ? sucralfate  1 g Oral TID WC & HS  ? ?Continuous: ? dextrose 5 % and 0.9% NaCl 75 mL/hr at 03/19/22 1118  ? piperacillin-tazobactam (ZOSYN)  IV 12.5 mL/hr at 03/19/22 1047  ? ?OEV:[id number] **OR** acetaminophen, HYDROmorphone, ondansetron **OR** ondansetron (ZOFRAN) IV ? ?Allergies  ?Allergen Reactions  ? Nucynta [Tapentadol Hcl] Swelling  ? Codeine Nausea Only and Other (See Comments)  ?  Headache  ? Sulfonamide Derivatives Nausea Only and Other (See Comments)  ?  Headache  ? Esomeprazole Magnesium Nausea Only  ? Fluconazole Rash   ?  Redness and blistering on left thigh  ? ? ? ?ROS:  ?A comprehensive review of systems was negative except for: Gastrointestinal: positive for abdominal pain and diarrhea ? ?Blood pressure (!) 134/52, pulse 97, temperature

## 2022-03-19 NOTE — Progress Notes (Signed)
?PROGRESS NOTE ? ? ? ?Andrea Martinez  BUL:[id number] DOB: [birth year] DOA: 03/18/2022 ?PCP: Loman Brooklyn, FNP ? ? ?Brief Narrative:  ?Andrea Martinez is a 76 y.o. female with medical history significant for chronic myeloid leukemia, hypothyroidism depression. Patient presented to the ED with complaints of abdominal pain, diarrhea 3-5 loose watery bowel movements daily, and dry heaving. ? ?*Patient recently hospitalized 3/8 to 3/12 for similar, CT showed perforated duodenal ulcer -conservatively managed with IV Zosyn transition to p.o. Augmentin at discharge.  PPI was also prescribed but appears to be unfilled or not dispensed by pharmacy in the outpatient setting for unclear reasons. ? ?Assessment & Plan: ?  ?Principal Problem: ?  Perforation of duodenum ([place]) ?Active Problems: ?  CML (chronic myeloid leukemia) ([place]) ?  Hypothyroidism ?  Duodenitis ?  Perforated duodenal ulcer ([place]) ? ?* Perforation of duodenum ([place]), subacute versus recurrent ?Sepsis, POA ?Concurrent intractable abdominal pain nausea ?-Subacute, recurrent ?-General surgery consulted, appreciate insight recommendations ?-Continue on IV Zosyn  ?*(previously transition from Zosyn to Augmentin at discharge) ?-Sepsis criteria met with profound leukocytosis, hypothermia, and tachycardia at intake ?  ?CML (chronic myeloid leukemia) ([place]) ?Follows with Dr. Delton Coombes.  ?-Continue nilotinib 300 mg twice daily. ?  ?Hypothyroidism ?Hold nonessential meds for now given sepsis criteria concern for hypotension. ?  ?  ?DVT prophylaxis: SCDs ?Code Status: Full code ?Family Communication: None at bedside ? ?Status is: Inpatient ? ?Dispo: The patient is from: Home ?             Anticipated d/c is to: To be determined ?             Anticipated d/c date is: 48 to 72 hours ?             Patient currently not medically stable for discharge ? ?Consultants:  ?Gen Sx ? ?Procedures:  ?Pending ? ?Antimicrobials:  ?IV Zosyn ? ?Subjective: ?No acute issues or events overnight,  abdominal pain improving but not yet resolved, nausea ongoing but improving as well.  Denies chest pain fevers chills diarrhea constipation headache chest pain shortness of breath. ? ?Objective: ?Vitals:  ? 03/19/22 0600 03/19/22 0630 03/19/22 0700 03/19/22 0730  ?BP: 123/61 (!) 122/46 (!) 123/53 97/67  ?Pulse: 72 72 65 82  ?Resp:  18  18  ?Temp:      ?TempSrc:      ?SpO2: 98% 94% 92% 92%  ?Weight:      ?Height:      ? ?No intake or output data in the 24 hours ending 03/19/22 0839 ?Filed Weights  ? 03/18/22 1637  ?Weight: 90.3 kg  ? ? ?Examination: ? ?General exam: Appears calm and comfortable  ?Respiratory system: Clear to auscultation. Respiratory effort normal. ?Cardiovascular system: S1 & S2 heard, RRR. No JVD, murmurs, rubs, gallops or clicks. No pedal edema. ?Gastrointestinal system: Abdomen is nondistended, soft and nontender. No organomegaly or masses felt. Normal bowel sounds heard. ?Central nervous system: Alert and oriented. No focal neurological deficits. ?Extremities: Symmetric 5 x 5 power. ?Skin: No rashes, lesions or ulcers ?Psychiatry: Judgement and insight appear normal. Mood & affect appropriate.  ? ? ? ?Data Reviewed: I have personally reviewed following labs and imaging studies ? ?CBC: ?Recent Labs  ?Lab 03/18/22 ?1714 03/19/22 ?0351  ?WBC 20.6* 13.5*  ?HGB 12.1 10.2*  ?HCT 39.1 34.3*  ?MCV 86.5 89.6  ?PLT 366 249  ? ?Basic Metabolic Panel: ?Recent Labs  ?Lab 03/18/22 ?1714 03/19/22 ?0351  ?NA 136 139  ?K 4.4  4.9  ?CL 104 108  ?CO2 21* 24  ?GLUCOSE 123* 121*  ?BUN 27* 25*  ?CREATININE 1.08* 1.03*  ?CALCIUM 8.9 8.2*  ? ?GFR: ?Estimated Creatinine Clearance: 50.8 mL/min (A) (by C-G formula based on SCr of 1.03 mg/dL (H)). ?Liver Function Tests: ?Recent Labs  ?Lab 03/18/22 ?1714  ?AST 31  ?ALT 26  ?ALKPHOS 138*  ?BILITOT 1.2  ?PROT 8.3*  ?ALBUMIN 4.2  ? ?Recent Labs  ?Lab 03/18/22 ?1714  ?LIPASE 45  ? ?No results for input(s): AMMONIA in the last 168 hours. ?Coagulation Profile: ?No results for  input(s): INR, PROTIME in the last 168 hours. ?Cardiac Enzymes: ?No results for input(s): CKTOTAL, CKMB, CKMBINDEX, TROPONINI in the last 168 hours. ?BNP (last 3 results) ?No results for input(s): PROBNP in the last 8760 hours. ?HbA1C: ?No results for input(s): HGBA1C in the last 72 hours. ?CBG: ?No results for input(s): GLUCAP in the last 168 hours. ?Lipid Profile: ?No results for input(s): CHOL, HDL, LDLCALC, TRIG, CHOLHDL, LDLDIRECT in the last 72 hours. ?Thyroid Function Tests: ?No results for input(s): TSH, T4TOTAL, FREET4, T3FREE, THYROIDAB in the last 72 hours. ?Anemia Panel: ?No results for input(s): VITAMINB12, FOLATE, FERRITIN, TIBC, IRON, RETICCTPCT in the last 72 hours. ?Sepsis Labs: ?No results for input(s): PROCALCITON, LATICACIDVEN in the last 168 hours. ? ?No results found for this or any previous visit (from the past 240 hour(s)).  ? ? ? ? ? ?Radiology Studies: ?CT Abdomen Pelvis W Contrast ? ?Result Date: 03/18/2022 ?CLINICAL DATA:  Abdominal pain, acute, nonlocalized. Recent admission for localized perf duodenal ulcer, now with increased lower abd pain and diarrhea. History of leukemia on chemotherapy. Cholecystectomy and hysterectomy. EXAM: CT ABDOMEN AND PELVIS WITH CONTRAST TECHNIQUE: Multidetector CT imaging of the abdomen and pelvis was performed using the standard protocol following bolus administration of intravenous contrast. RADIATION DOSE REDUCTION: This exam was performed according to the departmental dose-optimization program which includes automated exposure control, adjustment of the mA and/or kV according to patient size and/or use of iterative reconstruction technique. CONTRAST:  147m OMNIPAQUE IOHEXOL 300 MG/ML  SOLN COMPARISON:  02/17/2022 CT abdomen/pelvis. FINDINGS: Lower chest: Solid 4 mm right lower lobe pulmonary nodule (series 4/image 6), stable. No acute abnormality at the lung bases. Hepatobiliary: Normal liver size. No liver mass. Cholecystectomy. Bile ducts are stable  with CBD diameter 11 mm, compatible with chronic post cholecystectomy effect. Pancreas: Normal, with no mass or duct dilation. Spleen: Normal size. No mass. Adrenals/Urinary Tract: Normal adrenals. Normal kidneys with no hydronephrosis and no renal mass. Nondistended bladder, obscured by streak artifact from right hip hardware, with no gross bladder abnormality. Stomach/Bowel: Persistent 2 cm gas containing ulcer projecting inferiorly from the duodenal bulb with surrounding soft tissue and fat stranding, not substantially changed from 02/17/2022 CT. Mild reactive wall thickening in the gastric antrum and pylorus, unchanged. Stomach is nondistended. Normal caliber small bowel with no small bowel wall thickening. Normal appendix. Marked sigmoid diverticulosis with no large bowel wall thickening or significant pericolonic fat stranding. Vascular/Lymphatic: Atherosclerotic nonaneurysmal abdominal aorta. Patent portal, splenic, hepatic and renal veins. No pathologically enlarged lymph nodes in the abdomen or pelvis. Reproductive: Status post hysterectomy, with no abnormal findings at the vaginal cuff. Simple 1.0 cm right adnexal cyst (series 2/image 65), stable. No left adnexal mass. Other: No pneumoperitoneum, ascites or focal fluid collection. Musculoskeletal: No aggressive appearing focal osseous lesions. Right total hip arthroplasty. Status post bilateral posterior spinal fusion at L5-S1. Chronic moderate T12 and L1 vertebral compression fracture status post vertebroplasty.  Moderate thoracolumbar spondylosis. IMPRESSION: 1. Persistent 2 cm gas-containing ulcer projecting inferiorly from the duodenal bulb with surrounding soft tissue and fat stranding, not substantially changed from 02/17/2022 CT. Mild reactive wall thickening in the gastric antrum and pylorus, unchanged. No free intraperitoneal air. No fluid collections. 2. Marked sigmoid diverticulosis with no evidence of acute diverticulitis. 3. Aortic  Atherosclerosis (ICD10-I70.0). Electronically Signed   By: Ilona Sorrel M.D.   On: 03/18/2022 19:07   ? ? ? ?Scheduled Meds: ? nilotinib  300 mg Oral Q12H  ? pantoprazole (PROTONIX) IV  40 mg Intravenous Q12H  ? ?Continu

## 2022-03-19 NOTE — Progress Notes (Addendum)
Nilotinib ([place]) hold criteria ?Persistent hypokalemia or hypomagnesemia ?Unexplained pancreatitis ?QTc > 500 msec (0.5 sec) [need recent EKG] ?Active infection ? ?Discussed with MD, will hold Nilotinib due to active infection. ? ?Lorenso Courier, PharmD ?Clinical Pharmacist ?03/19/2022 2:09 PM ? ? ? ? ? ? ?

## 2022-03-19 NOTE — Progress Notes (Signed)
Primary RN handling situation with patient/family very anxious and upset/concerned about her migraine headaches getting progressively worse.  Per Primary RN family had stated she went home with only a few days worth of prescription of migraine medications and had been with out them for sometime due to a miscommunication with her pharmacy and doctor's office.  Ask to restart IV after it was removed recently. ? ?Went to room to attempt IV insertion prior to guide ultrasound being used.  Patient has new IV placed which is positional but cool with flushing. Patient was crying and head was pounding but declined cool rag to assist her with migraine.  IV Dilaudid given after tylenol by mouth. ? ?Patient performed some breathing relaxation with me and lights where dimmed and decreased stimuli.  Patient given an ice cream shake.   ? ?Explained to patient and family next step is to give her Fioricet for migraines which Dr. Josephine Cables ordered (maybe take after 0100).  If that doesn't work Arts development officer can call back for some potential melatonin for sleep (prior to 0300 preferred).  Family felt concerns were met at this time and went home to let the patient rest. ? ?Primary RN following up with patient/family.  Floor CN and House [facility] aware of patient/family concerns and situation ? ?

## 2022-03-20 ENCOUNTER — Inpatient Hospital Stay (HOSPITAL_COMMUNITY): Payer: PPO | Admitting: Anesthesiology

## 2022-03-20 ENCOUNTER — Encounter (HOSPITAL_COMMUNITY)
Admission: EM | Disposition: A | Discharge status: Home / Self Care | Payer: Self-pay | Source: Home / Self Care | Attending: Internal Medicine

## 2022-03-20 ENCOUNTER — Encounter (HOSPITAL_COMMUNITY): Payer: Self-pay | Admitting: Internal Medicine

## 2022-03-20 DIAGNOSIS — K2980 Duodenitis without bleeding: Secondary | ICD-10-CM | POA: Diagnosis not present

## 2022-03-20 DIAGNOSIS — K259 Gastric ulcer, unspecified as acute or chronic, without hemorrhage or perforation: Secondary | ICD-10-CM

## 2022-03-20 DIAGNOSIS — K631 Perforation of intestine (nontraumatic): Secondary | ICD-10-CM | POA: Diagnosis not present

## 2022-03-20 DIAGNOSIS — R109 Unspecified abdominal pain: Secondary | ICD-10-CM

## 2022-03-20 DIAGNOSIS — K3189 Other diseases of stomach and duodenum: Secondary | ICD-10-CM | POA: Diagnosis not present

## 2022-03-20 DIAGNOSIS — K265 Chronic or unspecified duodenal ulcer with perforation: Secondary | ICD-10-CM

## 2022-03-20 HISTORY — PX: BIOPSY: SHX5522

## 2022-03-20 HISTORY — PX: ESOPHAGOGASTRODUODENOSCOPY (EGD) WITH PROPOFOL: SHX5813

## 2022-03-20 LAB — COMPREHENSIVE METABOLIC PANEL
ALT: 35 U/L (ref 0–44)
AST: 37 U/L (ref 15–41)
Albumin: 3.3 g/dL — ABNORMAL LOW (ref 3.5–5.0)
Alkaline Phosphatase: 140 U/L — ABNORMAL HIGH (ref 38–126)
Anion gap: 9 (ref 5–15)
BUN: 27 mg/dL — ABNORMAL HIGH (ref 8–23)
CO2: 23 mmol/L (ref 22–32)
Calcium: 8.3 mg/dL — ABNORMAL LOW (ref 8.9–10.3)
Chloride: 106 mmol/L (ref 98–111)
Creatinine, Ser: 1.14 mg/dL — ABNORMAL HIGH (ref 0.44–1.00)
GFR, Estimated: 50 mL/min — ABNORMAL LOW (ref >60)
Glucose, Bld: 96 mg/dL (ref 70–99)
Potassium: 4.6 mmol/L (ref 3.5–5.1)
Sodium: 138 mmol/L (ref 135–145)
Total Bilirubin: 0.6 mg/dL (ref 0.3–1.2)
Total Protein: 6.6 g/dL (ref 6.5–8.1)

## 2022-03-20 LAB — CBC
HCT: 34 % — ABNORMAL LOW (ref 36.0–46.0)
Hemoglobin: 9.8 g/dL — ABNORMAL LOW (ref 12.0–15.0)
MCH: 25.8 pg — ABNORMAL LOW (ref 26.0–34.0)
MCHC: 28.8 g/dL — ABNORMAL LOW (ref 30.0–36.0)
MCV: 89.5 fL (ref 80.0–100.0)
Platelets: 261 10*3/uL (ref 150–400)
RBC: 3.8 MIL/uL — ABNORMAL LOW (ref 3.87–5.11)
RDW: 15.6 % — ABNORMAL HIGH (ref 11.5–15.5)
WBC: 14 10*3/uL — ABNORMAL HIGH (ref 4.0–10.5)
nRBC: 0 % (ref 0.0–0.2)

## 2022-03-20 SURGERY — ESOPHAGOGASTRODUODENOSCOPY (EGD) WITH PROPOFOL
Anesthesia: General

## 2022-03-20 MED ORDER — LACTATED RINGERS IV SOLN: INTRAVENOUS | Status: DC

## 2022-03-20 MED ORDER — PROPOFOL 10 MG/ML IV BOLUS
INTRAVENOUS | Status: DC | PRN
Start: 2022-03-20 — End: 2022-03-20
  Administered 2022-03-20: 100 mg via INTRAVENOUS
  Administered 2022-03-20: 80 mg via INTRAVENOUS
  Administered 2022-03-20: 20 mg via INTRAVENOUS
  Administered 2022-03-20: 80 mg via INTRAVENOUS

## 2022-03-20 MED ORDER — LIDOCAINE HCL (CARDIAC) PF 100 MG/5ML IV SOSY
PREFILLED_SYRINGE | INTRAVENOUS | Status: DC | PRN
  Administered 2022-03-20 (×2): 60 mg via INTRATRACHEAL

## 2022-03-20 MED ORDER — ONDANSETRON HCL 4 MG/2ML IJ SOLN
INTRAMUSCULAR | Status: AC
  Filled 2022-03-20: qty 2

## 2022-03-20 MED ORDER — ACETAMINOPHEN 10 MG/ML IV SOLN
INTRAVENOUS | Status: AC
  Filled 2022-03-20: qty 100

## 2022-03-20 MED ORDER — SODIUM CHLORIDE 0.9 % IV SOLN: INTRAVENOUS | Status: DC

## 2022-03-20 MED ORDER — ACETAMINOPHEN 10 MG/ML IV SOLN
1000.0000 mg | Freq: Once | INTRAVENOUS | Status: AC
  Administered 2022-03-20: 1000 mg via INTRAVENOUS

## 2022-03-20 NOTE — Anesthesia Preprocedure Evaluation (Addendum)
Anesthesia Evaluation  ?Patient identified by MRN, date of birth, ID band ?Patient awake ? ? ? ?Reviewed: ?Allergy & Precautions, NPO status , Patient's Chart, lab work & pertinent test results ? ?History of Anesthesia Complications ?(+) PONV and history of anesthetic complications ? ?Airway ?Mallampati: II ? ?TM Distance: >3 FB ?Neck ROM: Full ? ? ? Dental ? ?(+) Dental Advisory Given, Caps ?  ?Pulmonary ?pneumonia,  ?VATS ?  ?Pulmonary exam normal ?breath sounds clear to auscultation ? ? ? ? ? ? Cardiovascular ?Exercise Tolerance: Good ?Normal cardiovascular exam+ dysrhythmias (Prolonged QT)  ?Rhythm:Regular Rate:Normal ? ? ?  ?Neuro/Psych ?PSYCHIATRIC DISORDERS Anxiety Depression negative neurological ROS ?   ? GI/Hepatic ?Neg liver ROS, PUD, neg GERD  Medicated,  ?Endo/Other  ?Hypothyroidism  ? Renal/GU ?negative Renal ROS  ?negative genitourinary ?  ?Musculoskeletal ? ?(+) Arthritis , Osteoarthritis,   ? Abdominal ?  ?Peds ?negative pediatric ROS ?(+)  Hematology ? ?(+) Blood dyscrasia (leukemia - CML), anemia ,   ?Anesthesia Other Findings ? ? Reproductive/Obstetrics ?negative OB ROS ? ?  ? ? ? ? ? ? ? ? ? ? ? ? ? ?  ?  ? ? ? ? ? ? ?Anesthesia Physical ?Anesthesia Plan ? ?ASA: 3 ? ?Anesthesia Plan: General  ? ?Post-op Pain Management: Minimal or no pain anticipated  ? ?Induction: Intravenous ? ?PONV Risk Score and Plan:  ? ?Airway Management Planned: Nasal Cannula and Natural Airway ? ?Additional Equipment:  ? ?Intra-op Plan:  ? ?Post-operative Plan: Possible Post-op intubation/ventilation ? ?Informed Consent: I have reviewed the patients History and Physical, chart, labs and discussed the procedure including the risks, benefits and alternatives for the proposed anesthesia with the patient or authorized representative who has indicated his/her understanding and acceptance.  ? ? ? ?Dental advisory given ? ?Plan Discussed with: Surgeon ? ?Anesthesia Plan Comments:    ? ? ? ? ? ?Anesthesia Quick Evaluation ? ?

## 2022-03-20 NOTE — Consult Note (Signed)
?Consulting  Provider: Dr. Avon Gully ?Primary Care Physician:  Loman Brooklyn, FNP ?Primary Gastroenterologist:  Dr. Abbey Chatters ? ?Reason for Consultation: Duodenal ulcer with contained perforation ? ?HPI:  ?Andrea Martinez is a 76 y.o. female with a past medical history of CML, peptic ulcer disease, hypothyroidism, dyslipidemia, osteoporosis, who presented to [facility] ER 03/18/2022 after presenting to my clinic prior. ? ?Admitted to [facility] 02/17/2022 with abdominal pain. CT abdomen pelvis with contrast showed severe duodenitis in the proximal duodenum with features consistent with contained perforation and acute gastritis.  She was treated conservatively with IV antibiotics.  Discharged 02/21/2022 on 7 more days of p.o. antibiotics.  Pantoprazole twice daily sent to her pharmacy.  She has not been taking this medication.  She states that the pharmacy called her and said she was already on a medication similar to this so she told them did not fill it. ? ?Since her discharge, she states she has been miserable.  Ongoing abdominal pain.  Has been using Mylanta and Tums regularly.  Poor p.o. intake.  Generalized weakness/fatigue.  No melena hematochezia. ? ?In the ER, found to have leukocytosis of 20,000, hemoglobin stable at 12.1, down to 9.8 today.  Repeat CT abdomen pelvis showed persistent 2 cm gas containing ulcer projecting inferiorly from the duodenal bulb with surrounding soft tissue and fat stranding not substantially changed from exam 1 month ago.  No free air.  No fluid collections. ? ?Past Medical History:  ?Diagnosis Date  ? Anxiety   ? denies  ? Arthritis   ? Chronic myelogenous leukemia (CML), BCR-ABL1-positive ([place]) 11/20/2015  ? Gastric ulcer 2011  ? EGD, 5/11  ? History of peptic ulcer disease   ? Hypothyroidism   ? not on meds, followed by Dr. Elyse Hsu  ? Lipoma   ? left upper arm  ? Mixed hyperlipidemia   ? Osteoporosis   ? Pericardial effusion   ? a. HCAP complicated by pericardial effusion  requiring pericardial window 10/2016 and large L pleural effusion requring VATS.  ? Pleural effusion   ? a. s/p VATS 2017.  ? Pneumonia 10/2016  ? PONV (postoperative nausea and vomiting)   ? history of  ? Prolonged QT interval   ? Urine incontinence   ? UTI (lower urinary tract infection)   ? frequent  ? Vitamin D deficiency   ? ? ?Past Surgical History:  ?Procedure Laterality Date  ? BRAVO [place] STUDY  12/04/2012  ? Procedure: BRAVO [place];  Surgeon: [name], MD;  [facility];  Service: Endoscopy;  Laterality: N/A;  ? CHOLECYSTECTOMY N/A 01/03/2014  ? Procedure: LAPAROSCOPIC CHOLECYSTECTOMY WITH INTRAOPERATIVE CHOLANGIOGRAM;  Surgeon: Joyice Faster. Cornett, MD;  Location: [place];  Service: General;  Laterality: N/A;  ? COLONOSCOPY  01/2004  ? DR [facility], few small tics  ? ESOPHAGOGASTRODUODENOSCOPY  02/2010  ? gastric ulcers  ? ESOPHAGOGASTRODUODENOSCOPY  12/04/2012  ? BTD:[id number] gastritis (inflammation) was found in the gastric antrum; multiple biopsies The duodenal mucosa showed no abnormalities in the bulb and second portion of the duodenum  ? JOINT REPLACEMENT Right 10/22/2019  ? KYPHOPLASTY N/A 02/14/2019  ? Procedure: KYPHOPLASTY T12 and L1;  Surgeon: Melina Schools, MD;  Location: [place];  Service: Orthopedics;  Laterality: N/A;  120 mins  ? LIPOMA EXCISION  08/02/2011  ? left shoulder  ? NOSE SURGERY    ? PARTIAL HYSTERECTOMY    ? vaginal at age 70 years of age  ? TOE DEBRIDEMENT Left  1962  ? lt great toe  ? TOTAL HIP ARTHROPLASTY Right 11/18/2021  ? Procedure: TOTAL HIP ARTHROPLASTY ANTERIOR APPROACH;  Surgeon: Gaynelle Arabian, MD;  [facility];  Service: Orthopedics;  Laterality: Right;  ? TOTAL KNEE ARTHROPLASTY Right 10/22/2019  ? Procedure: TOTAL KNEE ARTHROPLASTY;  Surgeon: Gaynelle Arabian, MD;  [facility];  Service: Orthopedics;  Laterality: Right;  71mn  ? TOTAL KNEE ARTHROPLASTY Left 03/03/2020  ? Procedure: TOTAL KNEE ARTHROPLASTY;  Surgeon: AGaynelle Arabian MD;   [facility];  Service: Orthopedics;  Laterality: Left;  53m  ? TRANSFORAMINAL LUMBAR INTERBODY FUSION (TLIF) WITH PEDICLE SCREW FIXATION 1 LEVEL N/A 01/28/2021  ? Procedure: TRANSFORAMINAL LUMBAR INTERBODY FUSION (TLIF) LUMBAR FIVE-SACRAL ONE;  Surgeon: BrMelina SchoolsMD;  [place] [facility] Service: Orthopedics;  Laterality: N/A;  4 hrs  ? VIDEO ASSISTED THORACOSCOPY (VATS)/EMPYEMA Left 10/25/2016  ? Procedure: VIDEO ASSISTED THORACOSCOPY (VATS), BRONCH,DRAINAGE OF PLEURAL EFFUSION,PERICARDIAL WINDOW WITH DRAINAGE OF PERICARDIAL FLUID, TEE;  Surgeon: StMelrose NakayamaMD;  [place] [facility] Service: Thoracic;  Laterality: Left;  ? VIDEO BRONCHOSCOPY N/A 10/25/2016  ? Procedure: VIDEO BRONCHOSCOPY;  Surgeon: StMelrose NakayamaMD;  [place] Service: Thoracic;  Laterality: N/A;  ? ? ?Prior to Admission medications   ?Medication Sig Start Date End Date Taking? Authorizing Provider  ?acetaminophen (TYLENOL) 500 MG tablet Take 1,000 mg by mouth every 8 (eight) hours as needed for moderate pain.   Yes [provider]  ?alum & mag hydroxide-simeth (MAALOX/MYLANTA) 200-200-20 MG/5ML suspension Take 30 mLs by mouth every 6 (six) hours as needed for indigestion or heartburn.   Yes [provider]  ?calcium carbonate (TUMS - DOSED IN MG ELEMENTAL CALCIUM) 500 MG chewable tablet Chew 1 tablet by mouth daily.   Yes [provider]  ?cetirizine (ZYRTEC) 10 MG tablet Take 10 mg by mouth daily.   Yes [provider]  ?Cholecalciferol (VITAMIN D3) 50 MCG (2000 UT) TABS Take 2,000 Units by mouth daily in the afternoon.   Yes [provider]  ?lactose free nutrition (BOOST) LIQD Take 237 mLs by mouth daily.   Yes [provider]  ?levothyroxine (SYNTHROID) 112 MCG tablet Take 1 tablet (112 mcg total) by mouth daily before breakfast. 10/05/21  Yes Nida, GeMarella ChimesMD  ?loperamide (IMODIUM A-D) 2 MG tablet Take 2 mg by mouth 4 (four) times daily as needed  for diarrhea or loose stools.   Yes [provider]  ?Multiple Vitamins-Minerals (CENTRUM SILVER PO) Take 1 tablet by mouth daily in the afternoon.   Yes [provider]  ?nilotinib (TASIGNA) 150 MG capsule Take 2 capsules (300 mg total) by mouth every 12 (twelve) hours. 11/16/21  Yes KaDerek JackMD  ?Probiotic Product (PROBIOTIC DAILY PO) Take 2 tablets by mouth daily in the afternoon.   Yes [provider]  ?butalbital-acetaminophen-caffeine (FIORICET) 50-325-40 MG tablet Take 1 tablet by mouth every 6 (six) hours as needed for headache or migraine. ?Patient not taking: Reported on 03/18/2022 02/21/22   ShHeath Lark, DO  ?dicyclomine (BENTYL) 10 MG capsule Take 1 capsule (10 mg total) by mouth 3 (three) times daily as needed for spasms. ?Patient not taking: Reported on 03/18/2022 02/21/22   ShHeath Lark, DO  ?FLUoxetine (PROZAC) 10 MG capsule Take 10 mg (1 tablet) by mouth once daily x2 weeks, then increase to 20 mg (2 tablets) daily. 02/26/22   JoLoman BrooklynFNP  ?ondansetron (ZOFRAN) 4 MG tablet Take 1 tablet (  4 mg total) by mouth every 6 (six) hours as needed for nausea. ?Patient not taking: Reported on 03/18/2022 02/21/22   Heath Lark D, DO  ?oxyCODONE (OXY IR/ROXICODONE) 5 MG immediate release tablet Take 1 tablet (5 mg total) by mouth every 6 (six) hours as needed for moderate pain, breakthrough pain or severe pain. ?Patient not taking: Reported on 03/18/2022 02/21/22   Heath Lark D, DO  ?pantoprazole (PROTONIX) 40 MG tablet Take 1 tablet (40 mg total) by mouth 2 (two) times daily. ?Patient not taking: Reported on 03/18/2022 02/21/22 03/23/22  Heath Lark D, DO  ?sucralfate (CARAFATE) 1 GM/10ML suspension Take 10 mLs (1 g total) by mouth 4 (four) times daily -  with meals and at bedtime. ?Patient not taking: Reported on 03/18/2022 02/11/22   Loman Brooklyn, FNP  ? ? ?Current Facility-Administered Medications  ?Medication Dose Route Frequency Provider Last Rate Last Admin  ? 0.9  %  sodium chloride infusion   Intravenous Continuous Eloise Harman, DO 20 mL/hr at 03/20/22 1021 New Bag at 03/20/22 1021  ? acetaminophen (TYLENOL) tablet 650 mg  650 mg Oral Q6H PRN Johnnye Lana

## 2022-03-20 NOTE — Op Note (Signed)
[facility] ?Patient Name: Andrea Martinez ?Procedure Date: 03/20/2022 12:26 PM ?MRN: [id number] ?Date of Birth: 1946-03-15 ?Attending MD: Elon Alas. Abbey Chatters , DO ?CSN: [id number] ?Age: 76 ?Admit Type: Inpatient ?Procedure:                Upper GI endoscopy ?Indications:              Epigastric abdominal pain, Abnormal CT of the GI  ?                          tract ?Providers:                Elon Alas. Abbey Chatters, DO, Lurline Del, RN, Ulice Dash  ?                          Blima Singer, Technician ?Referring MD:              ?Medicines:                See the Anesthesia note for documentation of the  ?                          administered medications ?Complications:            No immediate complications. ?Estimated Blood Loss:     Estimated blood loss was minimal. ?Procedure:                Pre-Anesthesia Assessment: ?                          - The anesthesia plan was to use monitored  ?                          anesthesia care (MAC). ?                          After obtaining informed consent, the endoscope was  ?                          passed under direct vision. Throughout the  ?                          procedure, the patient's blood pressure, pulse, and  ?                          oxygen saturations were monitored continuously. The  ?                          GIF-H190 ([id number]) scope was introduced through the  ?                          mouth, and advanced to the second part of duodenum.  ?                          The upper GI endoscopy was accomplished without  ?                          difficulty. The patient tolerated  the procedure  ?                          well. ?Scope In: 1:24:59 PM ?Scope Out: 1:28:56 PM ?Total Procedure Duration: 0 hours 3 minutes 57 seconds  ?Findings: ?     A small hiatal hernia was present. ?     Area of nodular mucosa in gastric antrum which was extensively biopsied  ?     with cold forceps. Just distal to this into the duodenal bulb was a  ?     large cratered 15 mm ulcer. No active  or stigmata of bleeding. ?     The second portion of the duodenum was normal. ?Impression:               - Small hiatal hernia. ?                          - Non-bleeding gastric ulcer with no stigmata of  ?                          bleeding. ?                          - Normal second portion of the duodenum. ?                          - Nodular mucosa in the gastric antrum. Biopsied. ?Moderate Sedation: ?     Per Anesthesia Care ?Recommendation:           - Return patient to hospital ward for ongoing care. ?                          - Soft diet. ?                          - IV Protonix BID while inpatient. Will need to be  ?                          on BID 40 mg PO upon DC. ?                          - Await pathology results. If negative for  ?                          dysplasia/malignancy would repeat EGD in 8-10 weeks. ?                          - Use sucralfate suspension 1 gram PO QID. ?Procedure Code(s):        --- Professional --- ?                          [phone], Esophagogastroduodenoscopy, flexible,  ?                          transoral; with biopsy, single or multiple ?Diagnosis Code(s):        --- Professional --- ?  K44.9, Diaphragmatic hernia without obstruction or  ?                          gangrene ?                          K25.9, Gastric ulcer, unspecified as acute or  ?                          chronic, without hemorrhage or perforation ?                          K31.89, Other diseases of stomach and duodenum ?                          R10.13, Epigastric pain ?                          R93.3, Abnormal findings on diagnostic imaging of  ?                          other parts of digestive tract ?CPT copyright 2019 American Medical Association. All rights reserved. ?The codes documented in this report are preliminary and upon coder review may  ?be revised to meet current compliance requirements. ?Elon Alas. Abbey Chatters, DO ?Elon Alas. Abbey Chatters, DO ?03/20/2022 1:39:15 PM ?This report  has been signed electronically. ?Number of Addenda: 0 ?

## 2022-03-20 NOTE — Interval H&P Note (Signed)
History and Physical Interval Note: ? ?03/20/2022 ?11:48 AM ? ?Andrea Martinez  has presented today for surgery, with the diagnosis of Abnormal CT, abdominal pain.  The various methods of treatment have been discussed with the patient and family. After consideration of risks, benefits and other options for treatment, the patient has consented to  Procedure(s): ?ESOPHAGOGASTRODUODENOSCOPY (EGD) WITH PROPOFOL (N/A) as a surgical intervention.  The patient's history has been reviewed, patient examined, no change in status, stable for surgery.  I have reviewed the patient's chart and labs.  Questions were answered to the patient's satisfaction.   ? ? ?Andrea Martinez ? ? ?

## 2022-03-20 NOTE — Transfer of Care (Signed)
Immediate Anesthesia Transfer of Care Note ? ?Patient: Andrea Martinez ? ?Procedure(s) Performed: ESOPHAGOGASTRODUODENOSCOPY (EGD) WITH PROPOFOL ?BIOPSY ? ?Patient Location: PACU ? ?Anesthesia Type:General ? ?Level of Consciousness: awake, alert  and oriented ? ?Airway & Oxygen Therapy: Patient Spontanous Breathing and Patient connected to nasal cannula oxygen ? ?Post-op Assessment: Report given to RN and Post -op Vital signs reviewed and stable ? ?Post vital signs: stable ? ?Last Vitals:  ?Vitals Value Taken Time  ?BP 125/58 03/20/22 1345  ?Temp 98   ?Pulse 81 03/20/22 1352  ?Resp 16 03/20/22 1352  ?SpO2 99 % 03/20/22 1352  ?Vitals shown include unvalidated device data. ? ?Last Pain:  ?Vitals:  ? 03/20/22 1232  ?TempSrc:   ?PainSc: 5   ?   ? ?Patients Stated Pain Goal: 7 (03/20/22 1232) ? ?Complications: No notable events documented. ?

## 2022-03-20 NOTE — H&P (View-Only) (Signed)
?Consulting  Provider: Dr. Avon Gully ?Primary Care Physician:  Loman Brooklyn, FNP ?Primary Gastroenterologist:  Dr. Abbey Chatters ? ?Reason for Consultation: Duodenal ulcer with contained perforation ? ?HPI:  ?Andrea Martinez is a 76 y.o. female with a past medical history of CML, peptic ulcer disease, hypothyroidism, dyslipidemia, osteoporosis, who presented to [facility] ER 03/18/2022 after presenting to my clinic prior. ? ?Admitted to [facility] 02/17/2022 with abdominal pain. CT abdomen pelvis with contrast showed severe duodenitis in the proximal duodenum with features consistent with contained perforation and acute gastritis.  She was treated conservatively with IV antibiotics.  Discharged 02/21/2022 on 7 more days of p.o. antibiotics.  Pantoprazole twice daily sent to her pharmacy.  She has not been taking this medication.  She states that the pharmacy called her and said she was already on a medication similar to this so she told them did not fill it. ? ?Since her discharge, she states she has been miserable.  Ongoing abdominal pain.  Has been using Mylanta and Tums regularly.  Poor p.o. intake.  Generalized weakness/fatigue.  No melena hematochezia. ? ?In the ER, found to have leukocytosis of 20,000, hemoglobin stable at 12.1, down to 9.8 today.  Repeat CT abdomen pelvis showed persistent 2 cm gas containing ulcer projecting inferiorly from the duodenal bulb with surrounding soft tissue and fat stranding not substantially changed from exam 1 month ago.  No free air.  No fluid collections. ? ?Past Medical History:  ?Diagnosis Date  ? Anxiety   ? denies  ? Arthritis   ? Chronic myelogenous leukemia (CML), BCR-ABL1-positive ([place]) 11/20/2015  ? Gastric ulcer 2011  ? EGD, 5/11  ? History of peptic ulcer disease   ? Hypothyroidism   ? not on meds, followed by Dr. Elyse Hsu  ? Lipoma   ? left upper arm  ? Mixed hyperlipidemia   ? Osteoporosis   ? Pericardial effusion   ? a. HCAP complicated by pericardial effusion  requiring pericardial window 10/2016 and large L pleural effusion requring VATS.  ? Pleural effusion   ? a. s/p VATS 2017.  ? Pneumonia 10/2016  ? PONV (postoperative nausea and vomiting)   ? history of  ? Prolonged QT interval   ? Urine incontinence   ? UTI (lower urinary tract infection)   ? frequent  ? Vitamin D deficiency   ? ? ?Past Surgical History:  ?Procedure Laterality Date  ? BRAVO [place] STUDY  12/04/2012  ? Procedure: BRAVO [place];  Surgeon: [name], MD;  [facility];  Service: Endoscopy;  Laterality: N/A;  ? CHOLECYSTECTOMY N/A 01/03/2014  ? Procedure: LAPAROSCOPIC CHOLECYSTECTOMY WITH INTRAOPERATIVE CHOLANGIOGRAM;  Surgeon: Joyice Faster. Cornett, MD;  Location: [place];  Service: General;  Laterality: N/A;  ? COLONOSCOPY  01/2004  ? DR [facility], few small tics  ? ESOPHAGOGASTRODUODENOSCOPY  02/2010  ? gastric ulcers  ? ESOPHAGOGASTRODUODENOSCOPY  12/04/2012  ? [id number] gastritis (inflammation) was found in the gastric antrum; multiple biopsies The duodenal mucosa showed no abnormalities in the bulb and second portion of the duodenum  ? JOINT REPLACEMENT Right 10/22/2019  ? KYPHOPLASTY N/A 02/14/2019  ? Procedure: KYPHOPLASTY T12 and L1;  Surgeon: Melina Schools, MD;  Location: [place];  Service: Orthopedics;  Laterality: N/A;  120 mins  ? LIPOMA EXCISION  08/02/2011  ? left shoulder  ? NOSE SURGERY    ? PARTIAL HYSTERECTOMY    ? vaginal at age 28 years of age  ? TOE DEBRIDEMENT Left  1962  ? lt great toe  ? TOTAL HIP ARTHROPLASTY Right 11/18/2021  ? Procedure: TOTAL HIP ARTHROPLASTY ANTERIOR APPROACH;  Surgeon: Gaynelle Arabian, MD;  [facility];  Service: Orthopedics;  Laterality: Right;  ? TOTAL KNEE ARTHROPLASTY Right 10/22/2019  ? Procedure: TOTAL KNEE ARTHROPLASTY;  Surgeon: Gaynelle Arabian, MD;  [facility];  Service: Orthopedics;  Laterality: Right;  52mn  ? TOTAL KNEE ARTHROPLASTY Left 03/03/2020  ? Procedure: TOTAL KNEE ARTHROPLASTY;  Surgeon: AGaynelle Arabian MD;   [facility];  Service: Orthopedics;  Laterality: Left;  527m  ? TRANSFORAMINAL LUMBAR INTERBODY FUSION (TLIF) WITH PEDICLE SCREW FIXATION 1 LEVEL N/A 01/28/2021  ? Procedure: TRANSFORAMINAL LUMBAR INTERBODY FUSION (TLIF) LUMBAR FIVE-SACRAL ONE;  Surgeon: BrMelina SchoolsMD;  [place]: Orthopedics;  Laterality: N/A;  4 hrs  ? VIDEO ASSISTED THORACOSCOPY (VATS)/EMPYEMA Left 10/25/2016  ? Procedure: VIDEO ASSISTED THORACOSCOPY (VATS), BRONCH,DRAINAGE OF PLEURAL EFFUSION,PERICARDIAL WINDOW WITH DRAINAGE OF PERICARDIAL FLUID, TEE;  Surgeon: StMelrose NakayamaMD;  [facility] Service: Thoracic;  Laterality: Left;  ? VIDEO BRONCHOSCOPY N/A 10/25/2016  ? Procedure: VIDEO BRONCHOSCOPY;  Surgeon: StMelrose NakayamaMD;  [facility] Service: Thoracic;  Laterality: N/A;  ? ? ?Prior to Admission medications   ?Medication Sig Start Date End Date Taking? Authorizing Provider  ?acetaminophen (TYLENOL) 500 MG tablet Take 1,000 mg by mouth every 8 (eight) hours as needed for moderate pain.   Yes [provider]  ?alum & mag hydroxide-simeth (MAALOX/MYLANTA) 200-200-20 MG/5ML suspension Take 30 mLs by mouth every 6 (six) hours as needed for indigestion or heartburn.   Yes [provider]  ?calcium carbonate (TUMS - DOSED IN MG ELEMENTAL CALCIUM) 500 MG chewable tablet Chew 1 tablet by mouth daily.   Yes [provider]  ?cetirizine (ZYRTEC) 10 MG tablet Take 10 mg by mouth daily.   Yes [provider]  ?Cholecalciferol (VITAMIN D3) 50 MCG (2000 UT) TABS Take 2,000 Units by mouth daily in the afternoon.   Yes [provider]  ?lactose free nutrition (BOOST) LIQD Take 237 mLs by mouth daily.   Yes [provider]  ?levothyroxine (SYNTHROID) 112 MCG tablet Take 1 tablet (112 mcg total) by mouth daily before breakfast. 10/05/21  Yes Nida, GeMarella ChimesMD  ?loperamide (IMODIUM A-D) 2 MG tablet Take 2 mg by mouth 4 (four) times daily as needed  for diarrhea or loose stools.   Yes [provider]  ?Multiple Vitamins-Minerals (CENTRUM SILVER PO) Take 1 tablet by mouth daily in the afternoon.   Yes [provider]  ?nilotinib (TASIGNA) 150 MG capsule Take 2 capsules (300 mg total) by mouth every 12 (twelve) hours. 11/16/21  Yes KaDerek JackMD  ?Probiotic Product (PROBIOTIC DAILY PO) Take 2 tablets by mouth daily in the afternoon.   Yes [provider]  ?butalbital-acetaminophen-caffeine (FIORICET) 50-325-40 MG tablet Take 1 tablet by mouth every 6 (six) hours as needed for headache or migraine. ?Patient not taking: Reported on 03/18/2022 02/21/22   ShHeath Lark, DO  ?dicyclomine (BENTYL) 10 MG capsule Take 1 capsule (10 mg total) by mouth 3 (three) times daily as needed for spasms. ?Patient not taking: Reported on 03/18/2022 02/21/22   ShHeath Lark, DO  ?FLUoxetine (PROZAC) 10 MG capsule Take 10 mg (1 tablet) by mouth once daily x2 weeks, then increase to 20 mg (2 tablets) daily. 02/26/22   JoLoman BrooklynFNP  ?ondansetron (ZOFRAN) 4 MG tablet Take 1 tablet (  4 mg total) by mouth every 6 (six) hours as needed for nausea. ?Patient not taking: Reported on 03/18/2022 02/21/22   Heath Lark D, DO  ?oxyCODONE (OXY IR/ROXICODONE) 5 MG immediate release tablet Take 1 tablet (5 mg total) by mouth every 6 (six) hours as needed for moderate pain, breakthrough pain or severe pain. ?Patient not taking: Reported on 03/18/2022 02/21/22   Heath Lark D, DO  ?pantoprazole (PROTONIX) 40 MG tablet Take 1 tablet (40 mg total) by mouth 2 (two) times daily. ?Patient not taking: Reported on 03/18/2022 02/21/22 03/23/22  Heath Lark D, DO  ?sucralfate (CARAFATE) 1 GM/10ML suspension Take 10 mLs (1 g total) by mouth 4 (four) times daily -  with meals and at bedtime. ?Patient not taking: Reported on 03/18/2022 02/11/22   Loman Brooklyn, FNP  ? ? ?Current Facility-Administered Medications  ?Medication Dose Route Frequency Provider Last Rate Last Admin  ? 0.9  %  sodium chloride infusion   Intravenous Continuous Eloise Harman, DO 20 mL/hr at 03/20/22 1021 New Bag at 03/20/22 1021  ? acetaminophen (TYLENOL) tablet 650 mg  650 mg Oral Q6H PRN Johnnye Lana

## 2022-03-20 NOTE — Progress Notes (Signed)
[facility] Progress Note ? ?Day of Surgery  ?Subjective: ?Seen this AM before her EGD. Having upper pain. Procalcitonin was 0.10.  ? ?Objective: ?Vital signs in last 24 hours: ?Temp:  [98.6 ?F (37 ?C)-100 ?F (37.8 ?C)] 100 ?F (37.8 ?C) (04/08 1229) ?Pulse Rate:  [89-93] 89 (04/08 1229) ?Resp:  [17-18] 17 (04/08 1229) ?BP: (76-137)/(45-64) 127/52 (04/08 1232) ?SpO2:  [96 %-98 %] 97 % (04/08 1229) ?Last BM Date : 03/18/22 ? ?Intake/Output from previous day: ?04/07 0701 - 04/08 0700 ?In: 944.6 [I.V.:877.8; IV Piggyback:66.7] ?Out: -  ?Intake/Output this shift: ?No intake/output data recorded. ? ?General appearance: alert, cooperative, and no distress ?GI: soft, mildly tender epigastric, no rebound or guarding ? ?Lab Results:  ?Recent Labs  ?  03/19/22 ?0351 03/20/22 ?0320  ?WBC 13.5* 14.0*  ?HGB 10.2* 9.8*  ?HCT 34.3* 34.0*  ?PLT 249 261  ? ?BMET ?Recent Labs  ?  03/19/22 ?0351 03/20/22 ?0320  ?NA 139 138  ?K 4.9 4.6  ?CL 108 106  ?CO2 24 23  ?GLUCOSE 121* 96  ?BUN 25* 27*  ?CREATININE 1.03* 1.14*  ?CALCIUM 8.2* 8.3*  ? ?PT/INR ?No results for input(s): LABPROT, INR in the last 72 hours. ? ?Studies/Results: ?CT Abdomen Pelvis W Contrast ? ?Result Date: 03/18/2022 ?CLINICAL DATA:  Abdominal pain, acute, nonlocalized. Recent admission for localized perf duodenal ulcer, now with increased lower abd pain and diarrhea. History of leukemia on chemotherapy. Cholecystectomy and hysterectomy. EXAM: CT ABDOMEN AND PELVIS WITH CONTRAST TECHNIQUE: Multidetector CT imaging of the abdomen and pelvis was performed using the standard protocol following bolus administration of intravenous contrast. RADIATION DOSE REDUCTION: This exam was performed according to the departmental dose-optimization program which includes automated exposure control, adjustment of the mA and/or kV according to patient size and/or use of iterative reconstruction technique. CONTRAST:  127m OMNIPAQUE IOHEXOL 300 MG/ML  SOLN COMPARISON:   02/17/2022 CT abdomen/pelvis. FINDINGS: Lower chest: Solid 4 mm right lower lobe pulmonary nodule (series 4/image 6), stable. No acute abnormality at the lung bases. Hepatobiliary: Normal liver size. No liver mass. Cholecystectomy. Bile ducts are stable with CBD diameter 11 mm, compatible with chronic post cholecystectomy effect. Pancreas: Normal, with no mass or duct dilation. Spleen: Normal size. No mass. Adrenals/Urinary Tract: Normal adrenals. Normal kidneys with no hydronephrosis and no renal mass. Nondistended bladder, obscured by streak artifact from right hip hardware, with no gross bladder abnormality. Stomach/Bowel: Persistent 2 cm gas containing ulcer projecting inferiorly from the duodenal bulb with surrounding soft tissue and fat stranding, not substantially changed from 02/17/2022 CT. Mild reactive wall thickening in the gastric antrum and pylorus, unchanged. Stomach is nondistended. Normal caliber small bowel with no small bowel wall thickening. Normal appendix. Marked sigmoid diverticulosis with no large bowel wall thickening or significant pericolonic fat stranding. Vascular/Lymphatic: Atherosclerotic nonaneurysmal abdominal aorta. Patent portal, splenic, hepatic and renal veins. No pathologically enlarged lymph nodes in the abdomen or pelvis. Reproductive: Status post hysterectomy, with no abnormal findings at the vaginal cuff. Simple 1.0 cm right adnexal cyst (series 2/image 65), stable. No left adnexal mass. Other: No pneumoperitoneum, ascites or focal fluid collection. Musculoskeletal: No aggressive appearing focal osseous lesions. Right total hip arthroplasty. Status post bilateral posterior spinal fusion at L5-S1. Chronic moderate T12 and L1 vertebral compression fracture status post vertebroplasty. Moderate thoracolumbar spondylosis. IMPRESSION: 1. Persistent 2 cm gas-containing ulcer projecting inferiorly from the duodenal bulb with surrounding soft tissue and fat stranding, not  substantially changed from 02/17/2022 CT. Mild reactive wall thickening in the  gastric antrum and pylorus, unchanged. No free intraperitoneal air. No fluid collections. 2. Marked sigmoid diverticulosis with no evidence of acute diverticulitis. 3. Aortic Atherosclerosis (ICD10-I70.0). Electronically Signed   By: Ilona Sorrel M.D.   On: 03/18/2022 19:07   ? ?Anti-infectives: ?Anti-infectives (From admission, onward)  ? ? Start     Dose/Rate Route Frequency Ordered Stop  ? 03/19/22 0500  [MAR Hold]  piperacillin-tazobactam (ZOSYN) IVPB 3.375 g        (MAR Hold since Sat 03/20/2022 at 1216.Hold Reason: Transfer to a Procedural area)  ? 3.375 g ?12.5 mL/hr over 240 Minutes Intravenous Every 8 hours 03/18/22 2146    ? 03/18/22 1930  piperacillin-tazobactam (ZOSYN) IVPB 3.375 g       ? 3.375 g ?100 mL/hr over 30 Minutes Intravenous  Once 03/18/22 1921 03/18/22 2101  ? ?  ? ? ?Assessment/Plan: ?Patient with persistent duodenal ulcer. Getting EGD today. Dr. Abbey Chatters reports he has biopsied some areas in the antrum but did  not biopsy the ulcer. ? ?Diet as tolerated, defer to GI for starting back today ?Would think can stop Zosyn given normal procalcitonin, everything looks reactive ?  ?Awaiting pathology results ? ? LOS: 2 days  ? ? ?Andrea Martinez ?03/20/2022 ? ?

## 2022-03-20 NOTE — Progress Notes (Signed)
?PROGRESS NOTE ? ? ? ?Andrea Martinez  XTK:[id number] DOB: 1946/08/29 DOA: 03/18/2022 ?PCP: Andrea Brooklyn, FNP ? ? ?Brief Narrative:  ?Andrea Martinez is a 76 y.o. female with medical history significant for chronic myeloid leukemia, hypothyroidism depression. Patient presented to the ED with complaints of abdominal pain, diarrhea 3-5 loose watery bowel movements daily, and dry heaving. ? ?*Patient recently hospitalized 3/8 to 3/12 for similar, CT showed perforated duodenal ulcer -conservatively managed with IV Zosyn transition to p.o. Augmentin at discharge.  PPI was also prescribed but appears to be unfilled or not dispensed by pharmacy in the outpatient setting for unclear reasons. ? ?Assessment & Plan: ?  ?Principal Problem: ?  Perforation of duodenum ([place]) ?Active Problems: ?  CML (chronic myeloid leukemia) (Andrea Martinez) ?  Hypothyroidism ?  Duodenitis ?  Perforated duodenal ulcer (Andrea Martinez) ? ?Perforation of duodenum (Andrea Martinez), subacute versus recurrent ?Sepsis, POA ?Concurrent intractable abdominal pain nausea ?-Subacute/recurrent ?-General surgery consulted, appreciate insight recommendations - considered stable given 4 weeks out from previous incident ?-GI following, EGD pending today for further evaluation ?-Continue on IV Zosyn *(previously transition from Zosyn to Augmentin at discharge) ?-Sepsis criteria met with profound leukocytosis, hypothermia, and tachycardia at intake; however procalcitonin negative indicating these are likely reactive in nature.  ?  ?CML (chronic myeloid leukemia) (Andrea Martinez) ?Follows with Dr. Delton Coombes.  ?-Continue nilotinib 300 mg twice daily. ?  ?Hypothyroidism ?Hold nonessential meds for now given sepsis criteria concern for hypotension. ?  ?  ?DVT prophylaxis: SCDs ?Code Status: Full code ?Family Communication: None at bedside ? ?Status is: Inpatient ? ?Dispo: The patient is from: Home ?             Anticipated d/c is to: To be determined ?             Anticipated d/c date is: 48 to 72 hours ?              Patient currently not medically stable for discharge ? ?Consultants:  ?Gen Sx ? ?Procedures:  ?Pending ? ?Antimicrobials:  ?IV Zosyn ? ?Subjective: ?No acute issues or events overnight, abdominal pain improving but not yet resolved, nausea ongoing but improving as well.  Denies chest pain fevers chills diarrhea constipation headache chest pain shortness of breath. ? ?Objective: ?Vitals:  ? 03/19/22 1245 03/19/22 1321 03/19/22 2030 03/20/22 0356  ?BP: (!) 128/52 (!) 134/52 (!) 124/50 (!) 137/45  ?Pulse: 79 97 93 89  ?Resp: _0 ?Temp:  98.2 ?F (36.8 ?C) 98.6 ?F (37 ?C) 98.7 ?F (37.1 ?C)  ?TempSrc:  Oral Oral Oral  ?SpO2: 99% 95% 98% 96%  ?Weight:      ?Height:      ? ? ?Intake/Output Summary (Last 24 hours) at 03/20/2022 0726 ?Last data filed at 03/19/2022 1245 ?Gross per 24 hour  ?Intake 944.56 ml  ?Output --  ?Net 944.56 ml  ? ?Filed Weights  ? 03/18/22 1637  ?Weight: 90.3 kg  ? ? ?Examination: ? ?General exam: Appears calm and comfortable  ?Respiratory system: Clear to auscultation. Respiratory effort normal. ?Cardiovascular system: S1 & S2 heard, RRR. No JVD, murmurs, rubs, gallops or clicks. No pedal edema. ?Gastrointestinal system: Abdomen is nondistended, soft and nontender. No organomegaly or masses felt. Normal bowel sounds heard. ?Central nervous system: Alert and oriented. No focal neurological deficits. ?Extremities: Symmetric 5 x 5 power. ?Skin: No rashes, lesions or ulcers ?Psychiatry: Judgement and insight appear normal. Mood & affect appropriate.  ? ? ? ?Data Reviewed: I have  personally reviewed following labs and imaging studies ? ?CBC: ?Recent Labs  ?Lab 03/18/22 ?1714 03/19/22 ?0351 03/20/22 ?0320  ?WBC 20.6* 13.5* 14.0*  ?HGB 12.1 10.2* 9.8*  ?HCT 39.1 34.3* 34.0*  ?MCV 86.5 89.6 89.5  ?PLT 366 249 261  ? ? ?Basic Metabolic Panel: ?Recent Labs  ?Lab 03/18/22 ?1714 03/19/22 ?0351 03/20/22 ?0320  ?NA 136 139 138  ?K 4.4 4.9 4.6  ?CL 104 108 106  ?CO2 21* 24 23  ?GLUCOSE 123* 121* 96  ?BUN  27* 25* 27*  ?CREATININE 1.08* 1.03* 1.14*  ?CALCIUM 8.9 8.2* 8.3*  ? ? ?GFR: ?Estimated Creatinine Clearance: 45.9 mL/min (A) (by C-G formula based on SCr of 1.14 mg/dL (H)). ?Liver Function Tests: ?Recent Labs  ?Lab 03/18/22 ?1714 03/20/22 ?0320  ?AST 31 37  ?ALT 26 35  ?ALKPHOS 138* 140*  ?BILITOT 1.2 0.6  ?PROT 8.3* 6.6  ?ALBUMIN 4.2 3.3*  ? ? ?Recent Labs  ?Lab 03/18/22 ?1714  ?LIPASE 45  ? ? ?No results for input(s): AMMONIA in the last 168 hours. ?Coagulation Profile: ?No results for input(s): INR, PROTIME in the last 168 hours. ?Cardiac Enzymes: ?No results for input(s): CKTOTAL, CKMB, CKMBINDEX, TROPONINI in the last 168 hours. ?BNP (last 3 results) ?No results for input(s): PROBNP in the last 8760 hours. ?HbA1C: ?No results for input(s): HGBA1C in the last 72 hours. ?CBG: ?No results for input(s): GLUCAP in the last 168 hours. ?Lipid Profile: ?No results for input(s): CHOL, HDL, LDLCALC, TRIG, CHOLHDL, LDLDIRECT in the last 72 hours. ?Thyroid Function Tests: ?No results for input(s): TSH, T4TOTAL, FREET4, T3FREE, THYROIDAB in the last 72 hours. ?Anemia Panel: ?No results for input(s): VITAMINB12, FOLATE, FERRITIN, TIBC, IRON, RETICCTPCT in the last 72 hours. ?Sepsis Labs: ?Recent Labs  ?Lab 03/19/22 ?1438  ?PROCALCITON 0.10  ? ? ?No results found for this or any previous visit (from the past 240 hour(s)).  ? ? ? ? ? ?Radiology Studies: ?CT Abdomen Pelvis W Contrast ? ?Result Date: 03/18/2022 ?CLINICAL DATA:  Abdominal pain, acute, nonlocalized. Recent admission for localized perf duodenal ulcer, now with increased lower abd pain and diarrhea. History of leukemia on chemotherapy. Cholecystectomy and hysterectomy. EXAM: CT ABDOMEN AND PELVIS WITH CONTRAST TECHNIQUE: Multidetector CT imaging of the abdomen and pelvis was performed using the standard protocol following bolus administration of intravenous contrast. RADIATION DOSE REDUCTION: This exam was performed according to the departmental dose-optimization  program which includes automated exposure control, adjustment of the mA and/or kV according to patient size and/or use of iterative reconstruction technique. CONTRAST:  123m OMNIPAQUE IOHEXOL 300 MG/ML  SOLN COMPARISON:  02/17/2022 CT abdomen/pelvis. FINDINGS: Lower chest: Solid 4 mm right lower lobe pulmonary nodule (series 4/image 6), stable. No acute abnormality at the lung bases. Hepatobiliary: Normal liver size. No liver mass. Cholecystectomy. Bile ducts are stable with CBD diameter 11 mm, compatible with chronic post cholecystectomy effect. Pancreas: Normal, with no mass or duct dilation. Spleen: Normal size. No mass. Adrenals/Urinary Tract: Normal adrenals. Normal kidneys with no hydronephrosis and no renal mass. Nondistended bladder, obscured by streak artifact from right hip hardware, with no gross bladder abnormality. Stomach/Bowel: Persistent 2 cm gas containing ulcer projecting inferiorly from the duodenal bulb with surrounding soft tissue and fat stranding, not substantially changed from 02/17/2022 CT. Mild reactive wall thickening in the gastric antrum and pylorus, unchanged. Stomach is nondistended. Normal caliber small bowel with no small bowel wall thickening. Normal appendix. Marked sigmoid diverticulosis with no large bowel wall thickening or significant pericolonic fat stranding.  Vascular/Lymphatic: Atherosclerotic nonaneurysmal abdominal aorta. Patent portal, splenic, hepatic and renal veins. No pathologically enlarged lymph nodes in the abdomen or pelvis. Reproductive: Status post hysterectomy, with no abnormal findings at the vaginal cuff. Simple 1.0 cm right adnexal cyst (series 2/image 65), stable. No left adnexal mass. Other: No pneumoperitoneum, ascites or focal fluid collection. Musculoskeletal: No aggressive appearing focal osseous lesions. Right total hip arthroplasty. Status post bilateral posterior spinal fusion at L5-S1. Chronic moderate T12 and L1 vertebral compression fracture  status post vertebroplasty. Moderate thoracolumbar spondylosis. IMPRESSION: 1. Persistent 2 cm gas-containing ulcer projecting inferiorly from the duodenal bulb with surrounding soft tissue and fat strand

## 2022-03-20 NOTE — Anesthesia Postprocedure Evaluation (Signed)
Anesthesia Post Note ? ?Patient: Andrea Martinez ? ?Procedure(s) Performed: ESOPHAGOGASTRODUODENOSCOPY (EGD) WITH PROPOFOL ?BIOPSY ? ?Patient location during evaluation: PACU ?Anesthesia Type: General ?Level of consciousness: awake and alert and oriented ?Pain management: pain level controlled ?Vital Signs Assessment: post-procedure vital signs reviewed and stable ?Respiratory status: spontaneous breathing, nonlabored ventilation, respiratory function stable and patient connected to nasal cannula oxygen ?Cardiovascular status: blood pressure returned to baseline and stable ?Postop Assessment: no apparent nausea or vomiting ?Anesthetic complications: no ? ? ?No notable events documented. ? ? ?Last Vitals:  ?Vitals:  ? 03/20/22 1229 03/20/22 1232  ?BP: (!) 76/64 (!) 127/52  ?Pulse: 89   ?Resp: 17   ?Temp: 37.8 ?C   ?SpO2: 97%   ?  ?Last Pain:  ?Vitals:  ? 03/20/22 1232  ?TempSrc:   ?PainSc: 5   ? ? ?  ?  ?  ?  ?  ?  ? ?[name] ? ? ? ? ?

## 2022-03-21 ENCOUNTER — Other Ambulatory Visit: Payer: Self-pay | Admitting: Family Medicine

## 2022-03-21 DIAGNOSIS — E782 Mixed hyperlipidemia: Secondary | ICD-10-CM

## 2022-03-21 DIAGNOSIS — F419 Anxiety disorder, unspecified: Secondary | ICD-10-CM

## 2022-03-21 DIAGNOSIS — I700 Atherosclerosis of aorta: Secondary | ICD-10-CM

## 2022-03-21 DIAGNOSIS — K631 Perforation of intestine (nontraumatic): Secondary | ICD-10-CM | POA: Diagnosis not present

## 2022-03-21 LAB — COMPREHENSIVE METABOLIC PANEL
ALT: 26 U/L (ref 0–44)
AST: 24 U/L (ref 15–41)
Albumin: 3 g/dL — ABNORMAL LOW (ref 3.5–5.0)
Alkaline Phosphatase: 126 U/L (ref 38–126)
Anion gap: 7 (ref 5–15)
BUN: 22 mg/dL (ref 8–23)
CO2: 23 mmol/L (ref 22–32)
Calcium: 8.2 mg/dL — ABNORMAL LOW (ref 8.9–10.3)
Chloride: 108 mmol/L (ref 98–111)
Creatinine, Ser: 0.96 mg/dL (ref 0.44–1.00)
GFR, Estimated: >60 mL/min (ref >60)
Glucose, Bld: 107 mg/dL — ABNORMAL HIGH (ref 70–99)
Potassium: 4.2 mmol/L (ref 3.5–5.1)
Sodium: 138 mmol/L (ref 135–145)
Total Bilirubin: 0.4 mg/dL (ref 0.3–1.2)
Total Protein: 6.1 g/dL — ABNORMAL LOW (ref 6.5–8.1)

## 2022-03-21 LAB — CBC
HCT: 29.9 % — ABNORMAL LOW (ref 36.0–46.0)
Hemoglobin: 9.2 g/dL — ABNORMAL LOW (ref 12.0–15.0)
MCH: 26.8 pg (ref 26.0–34.0)
MCHC: 30.8 g/dL (ref 30.0–36.0)
MCV: 87.2 fL (ref 80.0–100.0)
Platelets: 217 10*3/uL (ref 150–400)
RBC: 3.43 MIL/uL — ABNORMAL LOW (ref 3.87–5.11)
RDW: 15.2 % (ref 11.5–15.5)
WBC: 11.6 10*3/uL — ABNORMAL HIGH (ref 4.0–10.5)
nRBC: 0 % (ref 0.0–0.2)

## 2022-03-21 MED ORDER — PANTOPRAZOLE SODIUM 40 MG PO TBEC: 40.0000 mg | DELAYED_RELEASE_TABLET | Freq: Two times a day (BID) | ORAL | 0 refills | Status: DC

## 2022-03-21 MED ORDER — SUCRALFATE 1 GM/10ML PO SUSP: 1.0000 g | Freq: Three times a day (TID) | ORAL | 1 refills | Status: DC

## 2022-03-21 NOTE — Discharge Summary (Signed)
Physician Discharge Summary  ?Andrea Martinez RJJ:[id number] DOB: May 17, 1946 DOA: 03/18/2022 ? ?PCP: Loman Brooklyn, FNP ? ?Admit date: 03/18/2022 ?Discharge date: 03/21/2022 ? ?Admitted From: Home ?Disposition: Home ? ?Recommendations for Outpatient Follow-up:  ?Follow up with PCP in 1-2 weeks ?Follow-up with GI and surgery as discussed and scheduled ? ?Home Health: None ?Equipment/Devices: None ? ?Discharge Condition: Stable ?CODE STATUS: Full ?Diet recommendation: Soft bland diet as discussed ? ?Brief/Interim Summary: ?Andrea Martinez is a 76 y.o. female with medical history significant for chronic myeloid leukemia, hypothyroidism depression. Patient presented to the ED with complaints of abdominal pain, diarrhea 3-5 loose watery bowel movements daily, and dry heaving. ?  ?*Patient recently hospitalized 3/8 to 3/12 for similar, CT showed perforated duodenal ulcer -conservatively managed with IV Zosyn transition to p.o. Augmentin at discharge.  PPI was also prescribed but appears to be unfilled or not dispensed by pharmacy in the outpatient setting for unclear reasons. ? ?Patient followed here by general surgery and GI, upper endoscopy on 03/20/2022 again shows nonbleeding gastric ulcer with nodular mucosa biopsies taken.  Patient's abdominal pain and nausea drastically improved over the past 24 hours on PPI which was previously prescribed at discharge but not taking medication.  At this time improving symptoms and able to tolerate p.o. now will discharge home with close follow-up by PCP GI and general surgery as scheduled.  Tentative plan to repeat EGD in 8 to 10 weeks to follow resolution of symptoms and to further evaluate abnormal mucosa. ? ?Discharge Diagnoses:  ?Principal Problem: ?  Perforation of duodenum ([place]) ?Active Problems: ?  CML (chronic myeloid leukemia) ([place]) ?  Hypothyroidism ?  Continuous severe abdominal pain ?  Duodenitis ?  Duodenal ulcer, perforated ([place]) ? ?  ?Perforation of duodenum ([place]),  stable ?Sepsis, POA ?Concurrent intractable abdominal pain nausea, resolving ?-Subacute/recurrent ?-General surgery consulted, appreciate insight recommendations - considered stable given 4 weeks out from previous incident -no indication for surgical intervention at this time ?-GI following, EGD as above without overt signs of bleeding ?-Discontinue antibiotics - Sepsis criteria met with profound leukocytosis, hypothermia, and tachycardia at intake; however procalcitonin negative indicating these are likely reactive in nature.  ?  ?CML (chronic myeloid leukemia) ([place]) ?Follows with Dr. Delton Coombes.  ?-Continue nilotinib 300 mg twice daily. ?  ?Hypothyroidism ?Hold nonessential meds for now given sepsis criteria concern for hypotension. ? ?Discharge Instructions ? ?Discharge Instructions   ? ? Discharge patient   Complete by: As directed ?  ? Discharge disposition: 01-Home or Self Care  ? Discharge patient date: 03/21/2022  ? ?  ? ?Allergies as of 03/21/2022   ? ?   Reactions  ? Nucynta [tapentadol Hcl] Swelling  ? Codeine Nausea Only, Other (See Comments)  ? Headache  ? Sulfonamide Derivatives Nausea Only, Other (See Comments)  ? Headache  ? Esomeprazole Magnesium Nausea Only  ? Fluconazole Rash  ? Redness and blistering on left thigh  ? ?  ? ?  ?Medication List  ?  ? ?TAKE these medications   ? ?acetaminophen 500 MG tablet ?Commonly known as: TYLENOL ?Take 1,000 mg by mouth every 8 (eight) hours as needed for moderate pain. ?  ?alum & mag hydroxide-simeth 200-200-20 MG/5ML suspension ?Commonly known as: MAALOX/MYLANTA ?Take 30 mLs by mouth every 6 (six) hours as needed for indigestion or heartburn. ?  ?butalbital-acetaminophen-caffeine 50-325-40 MG tablet ?Commonly known as: FIORICET ?Take 1 tablet by mouth every 6 (six) hours as needed for headache or migraine. ?  ?calcium carbonate  500 MG chewable tablet ?Commonly known as: TUMS - dosed in mg elemental calcium ?Chew 1 tablet by mouth daily. ?  ?CENTRUM SILVER PO ?Take  1 tablet by mouth daily in the afternoon. ?  ?cetirizine 10 MG tablet ?Commonly known as: ZYRTEC ?Take 10 mg by mouth daily. ?  ?dicyclomine 10 MG capsule ?Commonly known as: BENTYL ?Take 1 capsule (10 mg total) by mouth 3 (three) times daily as needed for spasms. ?  ?FLUoxetine 10 MG capsule ?Commonly known as: PROZAC ?Take 10 mg (1 tablet) by mouth once daily x2 weeks, then increase to 20 mg (2 tablets) daily. ?  ?lactose free nutrition Liqd ?Take 237 mLs by mouth daily. ?  ?levothyroxine 112 MCG tablet ?Commonly known as: SYNTHROID ?Take 1 tablet (112 mcg total) by mouth daily before breakfast. ?  ?loperamide 2 MG tablet ?Commonly known as: IMODIUM A-D ?Take 2 mg by mouth 4 (four) times daily as needed for diarrhea or loose stools. ?  ?nilotinib 150 MG capsule ?Commonly known as: TASIGNA ?Take 2 capsules (300 mg total) by mouth every 12 (twelve) hours. ?  ?ondansetron 4 MG tablet ?Commonly known as: ZOFRAN ?Take 1 tablet (4 mg total) by mouth every 6 (six) hours as needed for nausea. ?  ?oxyCODONE 5 MG immediate release tablet ?Commonly known as: Oxy IR/ROXICODONE ?Take 1 tablet (5 mg total) by mouth every 6 (six) hours as needed for moderate pain, breakthrough pain or severe pain. ?  ?pantoprazole 40 MG tablet ?Commonly known as: Protonix ?Take 1 tablet (40 mg total) by mouth 2 (two) times daily. ?  ?PROBIOTIC DAILY PO ?Take 2 tablets by mouth daily in the afternoon. ?  ?sucralfate 1 GM/10ML suspension ?Commonly known as: Carafate ?Take 10 mLs (1 g total) by mouth 4 (four) times daily -  with meals and at bedtime. ?  ?Vitamin D3 50 MCG (2000 UT) Tabs ?Take 2,000 Units by mouth daily in the afternoon. ?  ? ?  ? ? ?Allergies  ?Allergen Reactions  ? Nucynta [Tapentadol Hcl] Swelling  ? Codeine Nausea Only and Other (See Comments)  ?  Headache  ? Sulfonamide Derivatives Nausea Only and Other (See Comments)  ?  Headache  ? Esomeprazole Magnesium Nausea Only  ? Fluconazole Rash  ?  Redness and blistering on left thigh   ? ? ?Consultations: ?General surgery, GI ? ?Procedures/Studies: ?CT Abdomen Pelvis W Contrast ? ?Result Date: 03/18/2022 ?CLINICAL DATA:  Abdominal pain, acute, nonlocalized. Recent admission for localized perf duodenal ulcer, now with increased lower abd pain and diarrhea. History of leukemia on chemotherapy. Cholecystectomy and hysterectomy. EXAM: CT ABDOMEN AND PELVIS WITH CONTRAST TECHNIQUE: Multidetector CT imaging of the abdomen and pelvis was performed using the standard protocol following bolus administration of intravenous contrast. RADIATION DOSE REDUCTION: This exam was performed according to the departmental dose-optimization program which includes automated exposure control, adjustment of the mA and/or kV according to patient size and/or use of iterative reconstruction technique. CONTRAST:  172m OMNIPAQUE IOHEXOL 300 MG/ML  SOLN COMPARISON:  02/17/2022 CT abdomen/pelvis. FINDINGS: Lower chest: Solid 4 mm right lower lobe pulmonary nodule (series 4/image 6), stable. No acute abnormality at the lung bases. Hepatobiliary: Normal liver size. No liver mass. Cholecystectomy. Bile ducts are stable with CBD diameter 11 mm, compatible with chronic post cholecystectomy effect. Pancreas: Normal, with no mass or duct dilation. Spleen: Normal size. No mass. Adrenals/Urinary Tract: Normal adrenals. Normal kidneys with no hydronephrosis and no renal mass. Nondistended bladder, obscured by streak artifact from right hip  hardware, with no gross bladder abnormality. Stomach/Bowel: Persistent 2 cm gas containing ulcer projecting inferiorly from the duodenal bulb with surrounding soft tissue and fat stranding, not substantially changed from 02/17/2022 CT. Mild reactive wall thickening in the gastric antrum and pylorus, unchanged. Stomach is nondistended. Normal caliber small bowel with no small bowel wall thickening. Normal appendix. Marked sigmoid diverticulosis with no large bowel wall thickening or significant  pericolonic fat stranding. Vascular/Lymphatic: Atherosclerotic nonaneurysmal abdominal aorta. Patent portal, splenic, hepatic and renal veins. No pathologically enlarged lymph nodes in the abdomen or pelvis. Reproduc

## 2022-03-22 ENCOUNTER — Telehealth: Payer: Self-pay

## 2022-03-22 NOTE — Telephone Encounter (Signed)
Transition Care Management Follow-up Telephone Call ?Date of discharge and from where: 03/21/22 [facility] ?How have you been since you were released from the hospital? Pt states she is still very weak and still sleeping a lot. ?Any questions or concerns? No ? ?Items Reviewed: ?Did the pt receive and understand the discharge instructions provided? Yes  ?Medications obtained and verified? Yes  ?Other? No  ?Any new allergies since your discharge? No  ?Dietary orders reviewed? Yes ?Do you have support at home? Yes  ? ?Home Care and Equipment/Supplies: ?Were home health services ordered? no ?If so, what is the name of the agency? N/A  ?Has the agency set up a time to come to the patient's home? not applicable ?Were any new equipment or medical supplies ordered?  N ?What is the name of the medical supply agency? N/A ?Were you able to get the supplies/equipment? not applicable ?Do you have any questions related to the use of the equipment or supplies? No ? ?Functional Questionnaire: (I = Independent and D = Dependent) ?ADLs: I ? ?Bathing/Dressing- I ? ?Meal Prep- I ? ?Eating- I ? ?Maintaining continence- I ? ?Transferring/Ambulation- I ? ?Managing Meds- I ? ?Follow up appointments reviewed: ? ?PCP Hospital f/u appt confirmed? Yes  Scheduled to see Chevis Pretty on 03/30/22 @ 9:15. ?[facility] f/u appt confirmed? No  GI has not scheduled f/u yet. ?Are transportation arrangements needed? No  ?If their condition worsens, is the pt aware to call PCP or go to the Emergency Dept.? Yes ?Was the patient provided with contact information for the PCP's office or ED? Yes ?Was to pt encouraged to call back with questions or concerns? Yes ? ?

## 2022-03-22 NOTE — Telephone Encounter (Signed)
Patient is not currently on medication per list.  ?

## 2022-03-23 LAB — SURGICAL PATHOLOGY

## 2022-03-24 ENCOUNTER — Encounter (HOSPITAL_COMMUNITY): Payer: Self-pay | Admitting: Internal Medicine

## 2022-03-25 ENCOUNTER — Other Ambulatory Visit: Payer: Self-pay | Admitting: Family Medicine

## 2022-03-25 DIAGNOSIS — F320 Major depressive disorder, single episode, mild: Secondary | ICD-10-CM

## 2022-03-25 DIAGNOSIS — F419 Anxiety disorder, unspecified: Secondary | ICD-10-CM

## 2022-03-30 ENCOUNTER — Encounter: Payer: Self-pay | Admitting: Nurse Practitioner

## 2022-03-30 ENCOUNTER — Ambulatory Visit (INDEPENDENT_AMBULATORY_CARE_PROVIDER_SITE_OTHER): Payer: PPO | Admitting: Nurse Practitioner

## 2022-03-30 ENCOUNTER — Telehealth: Payer: Self-pay | Admitting: Internal Medicine

## 2022-03-30 VITALS — BP 107/67 | HR 84 | Temp 97.4°F | Resp 20 | Ht 65.0 in | Wt 201.0 lb

## 2022-03-30 DIAGNOSIS — Z09 Encounter for follow-up examination after completed treatment for conditions other than malignant neoplasm: Secondary | ICD-10-CM | POA: Diagnosis not present

## 2022-03-30 DIAGNOSIS — K269 Duodenal ulcer, unspecified as acute or chronic, without hemorrhage or perforation: Secondary | ICD-10-CM

## 2022-03-30 DIAGNOSIS — Z7689 Persons encountering health services in other specified circumstances: Secondary | ICD-10-CM

## 2022-03-30 NOTE — Telephone Encounter (Signed)
repeat EGD in 8- ?10 weeks. Per Dr Ave Filter recommendation in procedure note on 03/20/2022. Please advise if she needs OV prior to scheduling.  ?

## 2022-03-30 NOTE — Progress Notes (Signed)
? ?Subjective:  ? ? Patient ID: Andrea Martinez, female    DOB: April 10, 1946, 76 y.o.   MRN: [id number] ? ?Today's visit was for Transitional Care Management. ? 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  ? ?Contact with the patient and/or caregiver, by a clinical staff member, was made on 03/22/22 and was documented as a telephone encounter within the EMR. ? ?Through chart review and discussion with the patient I have determined that management of their condition is of high complexity.  ? ?She is still very weak. She is taking carafate suspension 4x a day which is helping with pain. She os also watching her diet. They are currently just watching her right now. They are hoping that they are not going to have to do surgery. She feels well today. Has follow up endoscopy in a couple of weeks. ? ? ? ? ? ?Review of Systems  ?Constitutional:  Positive for fatigue. Negative for diaphoresis.  ?Eyes:  Negative for pain.  ?Respiratory:  Negative for shortness of breath.   ?Cardiovascular:  Negative for chest pain, palpitations and leg swelling.  ?Gastrointestinal:  Positive for abdominal pain.  ?Endocrine: Negative for polydipsia.  ?Skin:  Negative for rash.  ?Neurological:  Positive for weakness. Negative for dizziness and headaches.  ?Hematological:  Does not bruise/bleed easily.  ?All other systems reviewed and are negative. ? ?   ?Objective:  ? Physical Exam ?Vitals reviewed.  ?Constitutional:   ?   Appearance: Normal appearance. She is obese.  ?Cardiovascular:  ?   Rate and Rhythm: Normal rate and regular rhythm.  ?   Heart sounds: Normal heart sounds.  ?Pulmonary:  ?   Effort: Pulmonary effort is normal.  ?   Breath sounds: Normal breath sounds.  ?Abdominal:  ?   General: Abdomen is flat. Bowel sounds are normal. There is no distension.  ?   Palpations: Abdomen is soft. There is no mass.  ?   Tenderness: There is no abdominal  tenderness. There is no guarding or rebound.  ?   Hernia: No hernia is present.  ?Skin: ?   General: Skin is warm.  ?Neurological:  ?   General: No focal deficit present.  ?   Mental Status: She is alert and oriented to person, place, and time.  ?Psychiatric:     ?   Mood and Affect: Mood normal.     ?   Behavior: Behavior normal.  ? ? ?BP 107/67   Pulse 84   Temp (!) 97.4 ?F (36.3 ?C) (Temporal)   Resp 20   Ht '5\' 5"'$  (1.651 m)   Wt 201 lb (91.2 kg)   SpO2 95%   BMI 33.45 kg/m?  ? ? ? ?   ?Assessment & Plan:  ?Andrea Martinez in today with chief complaint of Transitions Of Care ? ? ?1. Encounter for support and coordination of transition of care ?Hospital records reviewed ? ?2. Duodenal ulcer ?Avoid spicy and fatty foods ?Bland diet ?Keep all follow up appointments ? ? ? ?The above assessment and management plan was discussed with the patient. The patient verbalized understanding of and has agreed to the management plan. Patient is aware to call the clinic if symptoms persist or worsen. Patient is aware when to return to the clinic for a follow-up visit. Patient educated on when it is appropriate to go to the emergency department.  ? ?Franshesca-Margaret Hassell Done, FNP ? ? ? ?

## 2022-03-30 NOTE — Patient Instructions (Signed)
Bland Diet A bland diet may consist of soft foods or foods that are not high in fat or are not greasy, acidic, or spicy. Avoiding certain foods may cause less irritation to your mouth, throat, stomach, or gastrointestinal tract. Avoiding certain foods may make you feel better. Everyone's tolerances are different. A bland diet should be based on what you can tolerate and what may cause discomfort. What is my plan? Your health care provider or dietitian may recommend specific changes to your diet to treat your symptoms. These changes may include: Eating small meals frequently. Cooking food until it is soft enough to chew easily. Taking the time to chew your food thoroughly, so it is easy to swallow and digest. Avoiding foods that cause you discomfort. These may include spicy food, fried food, greasy foods, hard-to-chew foods, or citrus fruits and juices. Drinking slowly. What are tips for following this plan? Reading food labels To reduce fiber intake, look for food labels that say "whole," such as whole wheat or whole grain. Shopping Avoid food items that may have nuts or seeds. Avoid vegetables that may make you gassy or have a tough texture, such as broccoli, cauliflower, or corn. Cooking Cook foods thoroughly so they have a soft texture. Meal planning Make sure you include foods from all food groups to eat a balanced diet. Eat a variety of types of foods. Eat foods and drink beverages that do not cause you discomfort. These may include soups and broths with cooked meats, pasta, and vegetables. Lifestyle Sit up after meals, avoid tight clothing, and take time to eat and chew your food slowly. Ask your health care provider whether you should take dietary supplements. General information Mildly season your foods. Some seasonings, such as cayenne pepper, vinegar, or hot sauce, may cause irritation. The foods, beverages, or seasonings to avoid should be based on individual tolerance. What  foods should I eat? Fruits Canned or cooked fruit such as peaches, pears, or applesauce. Bananas. Vegetables Well-cooked vegetables. Canned or cooked vegetables such as carrots, green beans, beets, or spinach. Mashed or boiled potatoes. Grains  Hot cereals, such as cream of wheat and processed oatmeal. Rice. Bread, crackers, pasta, or tortillas made from refined white flour. Meats and other proteins  Eggs. Creamy peanut butter or other nut butters. Lean, well-cooked tender meats, such as beef, pork, chicken, or fish. Dairy Low-fat dairy products such as milk, cottage cheese, or yogurt. Beverages  Water. Herbal tea. Apple juice. Fats and oils Mild salad dressings. Canola or olive oil. Sweets and desserts Low-fat pudding, custard, or ice cream. Fruit gelatin. The items listed above may not be a complete list of foods and beverages you can eat. Contact a dietitian for more information. What foods should I avoid? Fruits Citrus fruits, such as oranges and grapefruit. Fruits with a stringy texture. Fruits that have lots of seeds, such as kiwi or strawberries. Dried fruits. Vegetables Raw, uncooked vegetables. Salads. Grains Whole grain breads, muffins, and cereals. Meats and other proteins Tough, fibrous meats. Highly seasoned meat such as corned beef, smoked meats, or fish. Processed high-fat meats such as brats, hot dogs, or sausage. Dairy Full-fat dairy foods such as ice cream and cheese. Beverages Caffeinated drinks. Alcohol. Seasonings and condiments Strongly flavored seasonings or condiments. Hot sauce. Salsa. Other foods Spicy foods. Fried or greasy foods. Sour foods, such as pickled or fermented foods like sauerkraut. Foods high in fiber. The items listed above may not be a complete list of foods and beverages you should   avoid. Contact a dietitian for more information. Summary A bland diet should be based on individual tolerance. It may consist of foods that are soft  textured and do not have a lot of fat, fiber, acid, or seasonings. A bland diet may be recommended because avoiding certain foods, beverages, or spices may make you feel better. This information is not intended to replace advice given to you by your health care provider. Make sure you discuss any questions you have with your health care provider. Document Revised: 10/19/2021 Document Reviewed: 10/19/2021 Elsevier Patient Education  2023 [employer]  

## 2022-03-31 ENCOUNTER — Ambulatory Visit: Payer: PPO | Admitting: Internal Medicine

## 2022-04-01 NOTE — Telephone Encounter (Signed)
Dr. Abbey Chatters, ?Returned the pt's call and was advised by her that the Carafate you have her on works good but its very expensive. She has to pay $100 even with her insurance. She was advised by [place] to continue to take it for a few weeks and was wondering if there is something else she can take. Please advise ?

## 2022-04-01 NOTE — Telephone Encounter (Signed)
Attempted to call patient on both numbers.  Went to [facility] both times.  The Carafate tablets typically are cheaper than the liquid formation.  After she finishes the liquid bottle, I can transition her to the tablets.  She can take tablets whole or  mix tablets with 15 to 20 mL of water and make a slurry and use this instead. ?

## 2022-04-02 ENCOUNTER — Telehealth: Payer: Self-pay | Admitting: Internal Medicine

## 2022-04-02 MED ORDER — SUCRALFATE 1 G PO TABS: 1.0000 g | ORAL_TABLET | Freq: Three times a day (TID) | ORAL | 1 refills | Status: DC

## 2022-04-02 NOTE — Telephone Encounter (Signed)
Phoned and LMOVM for the pt to return call 

## 2022-04-02 NOTE — Telephone Encounter (Signed)
Carafate tablets sent to pharmacy

## 2022-04-02 NOTE — Telephone Encounter (Signed)
Phoned and spoke with the pt and she would like for you to call in the pill form of Carafate today because she only has 3 doses of the liquid left and the pt was given instructions on how to use. Pt expressed understanding ?

## 2022-04-05 NOTE — Telephone Encounter (Signed)
Lmom for pt to return my call.  

## 2022-04-06 ENCOUNTER — Ambulatory Visit (INDEPENDENT_AMBULATORY_CARE_PROVIDER_SITE_OTHER): Payer: PPO | Admitting: Gastroenterology

## 2022-04-06 NOTE — Telephone Encounter (Signed)
Phoned and spoke with the pt and she had already received the call that the medication had been sent in ?

## 2022-04-25 ENCOUNTER — Other Ambulatory Visit: Payer: Self-pay | Admitting: Internal Medicine

## 2022-04-26 ENCOUNTER — Ambulatory Visit (INDEPENDENT_AMBULATORY_CARE_PROVIDER_SITE_OTHER): Payer: PPO | Admitting: Gastroenterology

## 2022-04-28 ENCOUNTER — Ambulatory Visit (INDEPENDENT_AMBULATORY_CARE_PROVIDER_SITE_OTHER): Payer: PPO | Admitting: Family Medicine

## 2022-04-28 ENCOUNTER — Encounter: Payer: Self-pay | Admitting: Family Medicine

## 2022-04-28 VITALS — BP 130/68 | HR 93 | Temp 95.8°F | Ht 65.0 in | Wt 202.8 lb

## 2022-04-28 DIAGNOSIS — F320 Major depressive disorder, single episode, mild: Secondary | ICD-10-CM | POA: Diagnosis not present

## 2022-04-28 DIAGNOSIS — Z0000 Encounter for general adult medical examination without abnormal findings: Secondary | ICD-10-CM

## 2022-04-28 DIAGNOSIS — F419 Anxiety disorder, unspecified: Secondary | ICD-10-CM | POA: Diagnosis not present

## 2022-04-28 DIAGNOSIS — K631 Perforation of intestine (nontraumatic): Secondary | ICD-10-CM

## 2022-04-28 DIAGNOSIS — Z0001 Encounter for general adult medical examination with abnormal findings: Secondary | ICD-10-CM

## 2022-04-28 MED ORDER — CITALOPRAM HYDROBROMIDE 20 MG PO TABS: 20.0000 mg | ORAL_TABLET | Freq: Every day | ORAL | 2 refills | Status: DC

## 2022-04-28 NOTE — Patient Instructions (Addendum)
Stop Prozac. Start Celexa.  ? ?Schedule eye exam ?

## 2022-04-28 NOTE — Progress Notes (Signed)
Assessment & Plan:  1. Well adult exam Preventive health education provided.  Encouraged to schedule an eye exam. - CBC with Differential/Platelet - CMP14+EGFR - Lipid panel - TSH - T4, free  2-3. Major depressive disorder, single episode, mild degree (HCC)/Anxiety Uncontrolled.  Switching from Prozac to Celexa. - CMP14+EGFR - citalopram (CELEXA) 20 MG tablet; Take 1 tablet (20 mg total) by mouth daily.  Dispense: 30 tablet; Refill: 2  4. Perforation of duodenum (HCC) Continue current medications with plan to return for repeat EGD with GI. - CMP14+EGFR   Follow-up: Return in about 6 weeks (around 06/09/2022) for Anxiety & Depression.   Hendricks Limes, MSN, APRN, FNP-C [facility]  Subjective:  Patient ID: Andrea Martinez, female    DOB: 1946-03-06  Age: 76 y.o. MRN: [id number]  Patient Care Team: Loman Brooklyn, FNP as PCP - General (Family Medicine) Derek Jack, MD as Medical Oncologist (Medical Oncology) Cassandria Anger, MD as Consulting Physician (Endocrinology) Melina Schools, MD as Consulting Physician (Orthopedic Surgery)   CC:  Chief Complaint  Patient presents with   Annual Exam    HPI Andrea Martinez presents for annual physical.  Occupation: retired, Marital status: married, Substance use: none Diet: bland, Exercise: none Last eye exam: years ago Last dental exam: years ago DEXA: normal on 05/08/2019 Immunizations: Flu Vaccine:  not influenza season Tdap Vaccine: declined  Shingrix Vaccine: declined  COVID-19 Vaccine:  declined booster Pneumonia Vaccine: up to date  Advanced Directives Patient does have advanced directives including living will, healthcare power of attorney, and financial power of attorney. She does not have a copy in the electronic medical record.    Depression/Anxiety: patient was switched from Lexapro to Prozac two months ago. She reports since starting it she has been sleeping at the wrong  times, has been shaking, has been lashing out at her husband, and has been more confused.  Patient declined completing the PHQ and GAD as she reports it will not show an improvement.     03/30/2022    9:26 AM 02/11/2022   11:18 AM 10/30/2021    2:22 PM  Depression screen PHQ 2/9  Decreased Interest 1 2 0  Down, Depressed, Hopeless 0 1 0  PHQ - 2 Score 1 3 0  Altered sleeping 1 2 0  Tired, decreased energy 1 2 0  Change in appetite 2 2 0  Feeling bad or failure about yourself  0 0 0  Trouble concentrating 1 1 0  Moving slowly or fidgety/restless 0 0 0  Suicidal thoughts 0 0 0  PHQ-9 Score 6 10 0  Difficult doing work/chores Not difficult at all Not difficult at all Not difficult at all      03/30/2022    9:26 AM 02/11/2022   11:18 AM 10/30/2021    2:22 PM 09/24/2021    2:03 PM  GAD 7 : Generalized Anxiety Score  Nervous, Anxious, on Edge 0 1 0 0  Control/stop worrying 0 1 0 1  Worry too much - different things 1 1 0 0  Trouble relaxing 0 0 0 0  Restless 0 0 0 0  Easily annoyed or irritable 0 0 0 0  Afraid - awful might happen 0 0 0 0  Total GAD 7 Score 1 3 0 1  Anxiety Difficulty Not difficult at all Not difficult at all Not difficult at all Not difficult at all    Review of Systems  Constitutional:  Positive for diaphoresis and  malaise/fatigue. Negative for chills, fever and weight loss.  HENT:  Negative for congestion, ear discharge, ear pain, nosebleeds, sinus pain, sore throat and tinnitus.   Eyes:  Positive for blurred vision. Negative for double vision, pain, discharge and redness.  Respiratory:  Negative for cough, shortness of breath and wheezing.   Cardiovascular:  Negative for chest pain, palpitations and leg swelling.  Gastrointestinal:  Positive for abdominal pain and heartburn. Negative for constipation, diarrhea, nausea and vomiting.  Genitourinary:  Negative for dysuria, frequency and urgency.  Musculoskeletal:  Positive for back pain and joint pain. Negative  for myalgias.  Skin:  Negative for rash.  Neurological:  Negative for dizziness, seizures, weakness and headaches.  Psychiatric/Behavioral:  Positive for depression. Negative for substance abuse and suicidal ideas. The patient is nervous/anxious and has insomnia.     Current Outpatient Medications:    acetaminophen (TYLENOL) 500 MG tablet, Take 1,000 mg by mouth every 8 (eight) hours as needed for moderate pain., Disp: , Rfl:    alum & mag hydroxide-simeth (MAALOX/MYLANTA) 200-200-20 MG/5ML suspension, Take 30 mLs by mouth every 6 (six) hours as needed for indigestion or heartburn., Disp: , Rfl:    butalbital-acetaminophen-caffeine (FIORICET) 50-325-40 MG tablet, Take 1 tablet by mouth every 6 (six) hours as needed for headache or migraine., Disp: 14 tablet, Rfl: 0   calcium carbonate (TUMS - DOSED IN MG ELEMENTAL CALCIUM) 500 MG chewable tablet, Chew 1 tablet by mouth daily., Disp: , Rfl:    cetirizine (ZYRTEC) 10 MG tablet, Take 10 mg by mouth daily., Disp: , Rfl:    Cholecalciferol (VITAMIN D3) 50 MCG (2000 UT) TABS, Take 2,000 Units by mouth daily in the afternoon., Disp: , Rfl:    dicyclomine (BENTYL) 10 MG capsule, Take 1 capsule (10 mg total) by mouth 3 (three) times daily as needed for spasms., Disp: 30 capsule, Rfl: 1   FLUoxetine (PROZAC) 20 MG capsule, Take 1 capsule (20 mg total) by mouth daily., Disp: 90 capsule, Rfl: 1   lactose free nutrition (BOOST) LIQD, Take 237 mLs by mouth daily., Disp: , Rfl:    levothyroxine (SYNTHROID) 112 MCG tablet, Take 1 tablet (112 mcg total) by mouth daily before breakfast., Disp: 90 tablet, Rfl: 3   loperamide (IMODIUM A-D) 2 MG tablet, Take 2 mg by mouth 4 (four) times daily as needed for diarrhea or loose stools., Disp: , Rfl:    Multiple Vitamins-Minerals (CENTRUM SILVER PO), Take 1 tablet by mouth daily in the afternoon., Disp: , Rfl:    nilotinib (TASIGNA) 150 MG capsule, Take 2 capsules (300 mg total) by mouth every 12 (twelve) hours., Disp: 120  capsule, Rfl: 6   ondansetron (ZOFRAN) 4 MG tablet, Take 1 tablet (4 mg total) by mouth every 6 (six) hours as needed for nausea., Disp: 20 tablet, Rfl: 0   oxyCODONE (OXY IR/ROXICODONE) 5 MG immediate release tablet, Take 1 tablet (5 mg total) by mouth every 6 (six) hours as needed for moderate pain, breakthrough pain or severe pain., Disp: 10 tablet, Rfl: 0   Probiotic Product (PROBIOTIC DAILY PO), Take 2 tablets by mouth daily in the afternoon., Disp: , Rfl:    sucralfate (CARAFATE) 1 g tablet, TAKE 1 TABLET (1 G TOTAL) BY MOUTH 4 TIMES A DAY WITH MEALS AND AT BEDTIME, Disp: 120 tablet, Rfl: 1   pantoprazole (PROTONIX) 40 MG tablet, Take 1 tablet (40 mg total) by mouth 2 (two) times daily., Disp: 60 tablet, Rfl: 0  Allergies  Allergen Reactions  Nucynta [Tapentadol Hcl] Swelling   Codeine Nausea Only and Other (See Comments)    Headache   Sulfonamide Derivatives Nausea Only and Other (See Comments)    Headache   Esomeprazole Magnesium Nausea Only   Fluconazole Rash    Redness and blistering on left thigh    Past Medical History:  Diagnosis Date   Anxiety    denies   Arthritis    Chronic myelogenous leukemia (CML), 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 Elyse Hsu   Lipoma    left upper arm   Mixed hyperlipidemia    Osteoporosis    Pericardial effusion    a. HCAP complicated by pericardial effusion requiring pericardial window 10/2016 and large L pleural effusion requring VATS.   Pleural effusion    a. s/p VATS 2017.   Pneumonia 10/2016   PONV (postoperative nausea and vomiting)    history of   Prolonged QT interval    Urine incontinence    UTI (lower urinary tract infection)    frequent   Vitamin D deficiency     Past Surgical History:  Procedure Laterality Date   BIOPSY  03/20/2022   Procedure: BIOPSY;  Surgeon: Eloise Harman, DO;  [facility];  Service: Endoscopy;;  gastric lesion vs. gastric ulcer   BRAVO [place] STUDY  12/04/2012   Procedure: BRAVO [place];  Surgeon: [name], MD;  [facility];  Service: Endoscopy;  Laterality: N/A;   CHOLECYSTECTOMY N/A 01/03/2014   Procedure: LAPAROSCOPIC CHOLECYSTECTOMY WITH INTRAOPERATIVE CHOLANGIOGRAM;  Surgeon: Joyice Faster. Cornett, MD;  Location: [place];  Service: General;  Laterality: N/A;   COLONOSCOPY  01/2004   DR [facility], few small tics   ESOPHAGOGASTRODUODENOSCOPY  02/2010   gastric ulcers   ESOPHAGOGASTRODUODENOSCOPY  12/04/2012   [id number] gastritis (inflammation) was found in the gastric antrum; multiple biopsies The duodenal mucosa showed no abnormalities in the bulb and second portion of the duodenum   ESOPHAGOGASTRODUODENOSCOPY (EGD) WITH PROPOFOL N/A 03/20/2022   Procedure: ESOPHAGOGASTRODUODENOSCOPY (EGD) WITH PROPOFOL;  Surgeon: Eloise Harman, DO;  [facility];  Service: Endoscopy;  Laterality: N/A;   JOINT REPLACEMENT Right 10/22/2019   KYPHOPLASTY N/A 02/14/2019   Procedure: KYPHOPLASTY T12 and L1;  Surgeon: Melina Schools, MD;  Location: [place];  Service: Orthopedics;  Laterality: N/A;  120 mins   LIPOMA EXCISION  08/02/2011   left shoulder   NOSE SURGERY     PARTIAL HYSTERECTOMY     vaginal at age 80 years of age   TOE 27 Left 1962   lt great toe   TOTAL HIP ARTHROPLASTY Right 11/18/2021   Procedure: TOTAL HIP ARTHROPLASTY ANTERIOR APPROACH;  Surgeon: Gaynelle Arabian, MD;  [facility];  Service: Orthopedics;  Laterality: Right;   TOTAL KNEE ARTHROPLASTY Right 10/22/2019   Procedure: TOTAL KNEE ARTHROPLASTY;  Surgeon: Gaynelle Arabian, MD;  [facility];  Service: Orthopedics;  Laterality: Right;  66mn   TOTAL KNEE ARTHROPLASTY Left 03/03/2020   Procedure: TOTAL KNEE ARTHROPLASTY;  Surgeon: AGaynelle Arabian MD;  [facility];  Service: Orthopedics;  Laterality: Left;  570m   TRANSFORAMINAL LUMBAR INTERBODY  FUSION (TLIF) WITH PEDICLE SCREW FIXATION 1 LEVEL N/A 01/28/2021   Procedure: TRANSFORAMINAL LUMBAR INTERBODY FUSION (TLIF) LUMBAR FIVE-SACRAL ONE;  Surgeon: BrMelina SchoolsMD;  [place] Service: Orth[place]ics;  Laterality: N/A;  4 hrs   VIDEO ASSISTED THORACOSCOPY (VATS)/EMPYEMA Left 10/25/2016   Procedure: VIDEO ASSISTED THORACOSCOPY (VATS), BRONCH,DRAINAGE OF PLEURAL EFFUSION,PERICARDIAL WINDOW WITH DRAINAGE OF PERICARDIAL FLUID, TEE;  Surgeon: [name], MD;  Location: [place];  Service: Thoracic;  Laterality: Left;   VIDEO BRONCHOSCOPY N/A 10/25/2016   Procedure: VIDEO BRONCHOSCOPY;  Surgeon: [name], MD;  Location: [place];  Service: Thoracic;  Laterality: N/A;    Family History  Problem Relation Age of Onset   Lung cancer Father    COPD Mother    Stroke Mother    Thyroid disease Mother    Colon cancer Neg Hx    Anesthesia problems Neg Hx    Hypotension Neg Hx    Malignant hyperthermia Neg Hx    Pseudochol deficiency Neg Hx     Social History   Socioeconomic History   Marital status: Married    Spouse name: Not on file   Number of children: 2   Years of education: 12th grade   Highest education level: Not on file  Occupational History   Occupation: retired    Fish farm manager: UENPLOYED    Employer: RETIRED  Tobacco Use   Smoking status: Never   Smokeless tobacco: Never  Vaping Use   Vaping Use: Never used  Substance and Sexual Activity   Alcohol use: No    Alcohol/week: 0.0 standard drinks   Drug use: No   Sexual activity: Yes    Partners: Male    Birth control/protection: None  Other Topics Concern   Not on file  Social History Narrative   Not on file   Social Determinants of Health   Financial Resource Strain: Low Risk    Difficulty of Paying Living Expenses: Not hard at all  Food Insecurity: No Food Insecurity   Worried About Charity fundraiser in the Last Year: Never true   [place] in the Last Year: Never true   Transportation Needs: No Transportation Needs   Lack of Transportation (Medical): No   Lack of Transportation (Non-Medical): No  Physical Activity: Sufficiently Active   Days of Exercise per Week: 7 days   Minutes of Exercise per Session: 30 min  Stress: No Stress Concern Present   Feeling of Stress : Only a little  Social Connections: Engineer, building services of Communication with Friends and Family: More than three times a week   Frequency of Social Gatherings with Friends and Family: More than three times a week   Attends Religious Services: More than 4 times per year   Active Member of [employer] or Organizations: Yes   Attends Archivist Meetings: 1 to 4 times per year   Marital Status: Married  Human resources officer Violence: Not on file      Objective:    BP 130/68   Pulse 93   Temp (!) 95.8 F (35.4 C) (Temporal)   Ht 5' 5" (1.651 m)   Wt 202 lb 12.8 oz (92 kg)   SpO2 97%   BMI 33.75 kg/m   Wt Readings from Last 3 Encounters:  04/28/22 202 lb 12.8 oz (92 kg)  03/30/22 201 lb (91.2 kg)  03/18/22 199 lb (90.3 kg)    Physical Exam Vitals reviewed.  Constitutional:      General: She is not in acute distress.    Appearance: Normal appearance. She is obese. She is not ill-appearing, toxic-appearing or diaphoretic.  HENT:     Head: Normocephalic and atraumatic.  Right Ear: Tympanic membrane, ear canal and external ear normal. There is no impacted cerumen.     Left Ear: Tympanic membrane, ear canal and external ear normal. There is no impacted cerumen.     Nose: Nose normal. No congestion or rhinorrhea.     Mouth/Throat:     Mouth: Mucous membranes are moist.     Pharynx: Oropharynx is clear. No oropharyngeal exudate or posterior oropharyngeal erythema.  Eyes:     General: No scleral icterus.       Right eye: No discharge.        Left eye: No discharge.     Conjunctiva/sclera: Conjunctivae normal.     Pupils: Pupils are equal, round, and reactive to  light.  Cardiovascular:     Rate and Rhythm: Normal rate and regular rhythm.     Heart sounds: Normal heart sounds. No murmur heard.   No friction rub. No gallop.  Pulmonary:     Effort: Pulmonary effort is normal. No respiratory distress.     Breath sounds: Normal breath sounds. No stridor. No wheezing, rhonchi or rales.  Abdominal:     General: Abdomen is flat. Bowel sounds are normal. There is no distension.     Palpations: Abdomen is soft. There is no hepatomegaly, splenomegaly or mass.     Tenderness: There is no abdominal tenderness. There is no guarding or rebound.     Hernia: No hernia is present.  Musculoskeletal:        General: Normal range of motion.     Cervical back: Normal range of motion and neck supple. No rigidity. No muscular tenderness.  Lymphadenopathy:     Cervical: No cervical adenopathy.  Skin:    General: Skin is warm and dry.     Capillary Refill: Capillary refill takes less than 2 seconds.  Neurological:     General: No focal deficit present.     Mental Status: She is alert and oriented to person, place, and time. Mental status is at baseline.  Psychiatric:        Mood and Affect: Mood is anxious. Affect is tearful.        Behavior: Behavior normal.        Thought Content: Thought content normal.        Judgment: Judgment normal.    Lab Results  Component Value Date   TSH 1.580 09/28/2021   Lab Results  Component Value Date   WBC 11.6 (H) 03/21/2022   HGB 9.2 (L) 03/21/2022   HCT 29.9 (L) 03/21/2022   MCV 87.2 03/21/2022   PLT 217 03/21/2022   Lab Results  Component Value Date   NA 138 03/21/2022   K 4.2 03/21/2022   CO2 23 03/21/2022   GLUCOSE 107 (H) 03/21/2022   BUN 22 03/21/2022   CREATININE 0.96 03/21/2022   BILITOT 0.4 03/21/2022   ALKPHOS 126 03/21/2022   AST 24 03/21/2022   ALT 26 03/21/2022   PROT 6.1 (L) 03/21/2022   ALBUMIN 3.0 (L) 03/21/2022   CALCIUM 8.2 (L) 03/21/2022   ANIONGAP 7 03/21/2022   EGFR 67 02/26/2022    Lab Results  Component Value Date   CHOL 206 (H) 01/09/2021   Lab Results  Component Value Date   HDL 60 01/09/2021   Lab Results  Component Value Date   LDLCALC 125 (H) 01/09/2021   Lab Results  Component Value Date   TRIG 116 01/09/2021   Lab Results  Component Value Date   CHOLHDL 3.4 01/09/2021  Lab Results  Component Value Date   HGBA1C 5.1 10/30/2021

## 2022-04-29 ENCOUNTER — Other Ambulatory Visit: Payer: Self-pay | Admitting: Family Medicine

## 2022-04-29 DIAGNOSIS — E039 Hypothyroidism, unspecified: Secondary | ICD-10-CM

## 2022-04-29 LAB — CMP14+EGFR
ALT: 20 IU/L (ref 0–32)
AST: 31 IU/L (ref 0–40)
Albumin/Globulin Ratio: 1.5 (ref 1.2–2.2)
Albumin: 4.4 g/dL (ref 3.7–4.7)
Alkaline Phosphatase: 205 IU/L — ABNORMAL HIGH (ref 44–121)
BUN/Creatinine Ratio: 24 (ref 12–28)
BUN: 24 mg/dL (ref 8–27)
Bilirubin Total: 0.4 mg/dL (ref 0.0–1.2)
CO2: 23 mmol/L (ref 20–29)
Calcium: 9.6 mg/dL (ref 8.7–10.3)
Chloride: 101 mmol/L (ref 96–106)
Creatinine, Ser: 1 mg/dL (ref 0.57–1.00)
Globulin, Total: 3 g/dL (ref 1.5–4.5)
Glucose: 94 mg/dL (ref 70–99)
Potassium: 5.1 mmol/L (ref 3.5–5.2)
Sodium: 141 mmol/L (ref 134–144)
Total Protein: 7.4 g/dL (ref 6.0–8.5)
eGFR: 58 mL/min/{1.73_m2} — ABNORMAL LOW (ref >59)

## 2022-04-29 LAB — CBC WITH DIFFERENTIAL/PLATELET
Basophils Absolute: 0 10*3/uL (ref 0.0–0.2)
Basos: 0 %
EOS (ABSOLUTE): 0.2 10*3/uL (ref 0.0–0.4)
Eos: 2 %
Hematocrit: 35.3 % (ref 34.0–46.6)
Hemoglobin: 11.3 g/dL (ref 11.1–15.9)
Immature Grans (Abs): 0.1 10*3/uL (ref 0.0–0.1)
Immature Granulocytes: 1 %
Lymphocytes Absolute: 1.5 10*3/uL (ref 0.7–3.1)
Lymphs: 14 %
MCH: 27 pg (ref 26.6–33.0)
MCHC: 32 g/dL (ref 31.5–35.7)
MCV: 84 fL (ref 79–97)
Monocytes Absolute: 0.9 10*3/uL (ref 0.1–0.9)
Monocytes: 8 %
Neutrophils Absolute: 8.2 10*3/uL — ABNORMAL HIGH (ref 1.4–7.0)
Neutrophils: 75 %
Platelets: 367 10*3/uL (ref 150–450)
RBC: 4.18 x10E6/uL (ref 3.77–5.28)
RDW: 13.8 % (ref 11.7–15.4)
WBC: 10.8 10*3/uL (ref 3.4–10.8)

## 2022-04-29 LAB — LIPID PANEL
Chol/HDL Ratio: 3.9 ratio (ref 0.0–4.4)
Cholesterol, Total: 222 mg/dL — ABNORMAL HIGH (ref 100–199)
HDL: 57 mg/dL (ref >39)
LDL Chol Calc (NIH): 124 mg/dL — ABNORMAL HIGH (ref 0–99)
Triglycerides: 233 mg/dL — ABNORMAL HIGH (ref 0–149)
VLDL Cholesterol Cal: 41 mg/dL — ABNORMAL HIGH (ref 5–40)

## 2022-04-29 LAB — T4, FREE: Free T4: 1.64 ng/dL (ref 0.82–1.77)

## 2022-04-29 LAB — TSH: TSH: 0.04 u[IU]/mL — ABNORMAL LOW (ref 0.450–4.500)

## 2022-04-29 MED ORDER — LEVOTHYROXINE SODIUM 100 MCG PO TABS: 100.0000 ug | ORAL_TABLET | Freq: Every day | ORAL | 2 refills | Status: DC

## 2022-05-03 DIAGNOSIS — M533 Sacrococcygeal disorders, not elsewhere classified: Secondary | ICD-10-CM | POA: Insufficient documentation

## 2022-05-17 ENCOUNTER — Ambulatory Visit (INDEPENDENT_AMBULATORY_CARE_PROVIDER_SITE_OTHER): Payer: PPO

## 2022-05-17 VITALS — Ht 65.0 in | Wt 202.0 lb

## 2022-05-17 DIAGNOSIS — Z0000 Encounter for general adult medical examination without abnormal findings: Secondary | ICD-10-CM

## 2022-05-17 NOTE — Patient Instructions (Signed)
[name] , Thank you for taking time to come for your Medicare Wellness Visit. I appreciate your ongoing commitment to your health goals. Please review the following plan we discussed and let me know if I can assist you in the future.   Screening recommendations/referrals: Colonoscopy: Done 07/25/2014 No follow up required. Mammogram: Done 09/03/2021. Repeat annually  Bone Density: Done 05/08/2019. Repeat every 2 years  Recommended yearly ophthalmology/optometry visit for glaucoma screening and checkup Recommended yearly dental visit for hygiene and checkup  Vaccinations: Influenza vaccine: Done 09/24/2021 Repeat annually  Pneumococcal vaccine: Done 10/09/2014 Tdap vaccine: Due every 10 years. Shingles vaccine: Discussed. Available at your local pharmacy.   Covid-19:Done 01/07/2021, 04/30/2020 and 04/02/2020.  Advanced directives: Please bring a copy of your health care power of attorney and living will to the office to be added to your chart at your convenience.   Conditions/risks identified: Aim for 30 minutes of exercise or brisk walking, 6-8 glasses of water, and 5 servings of fruits and vegetables each day.   Next appointment: Follow up in one year for your annual wellness visit 2024.   Preventive Care 33 Years and Older, Female Preventive care refers to lifestyle choices and visits with your health care provider that can promote health and wellness. What does preventive care include? A yearly physical exam. This is also called an annual well check. Dental exams once or twice a year. Routine eye exams. Ask your health care provider how often you should have your eyes checked. Personal lifestyle choices, including: Daily care of your teeth and gums. Regular physical activity. Eating a healthy diet. Avoiding tobacco and drug use. Limiting alcohol use. Practicing safe sex. Taking low-dose aspirin every day. Taking vitamin and mineral supplements as recommended by your health  care provider. What happens during an annual well check? The services and screenings done by your health care provider during your annual well check will depend on your age, overall health, lifestyle risk factors, and family history of disease. Counseling  Your health care provider may ask you questions about your: Alcohol use. Tobacco use. Drug use. Emotional well-being. Home and relationship well-being. Sexual activity. Eating habits. History of falls. Memory and ability to understand (cognition). Work and work Statistician. Reproductive health. Screening  You may have the following tests or measurements: Height, weight, and BMI. Blood pressure. Lipid and cholesterol levels. These may be checked every 5 years, or more frequently if you are over 50 years old. Skin check. Lung cancer screening. You may have this screening every year starting at age 77 if you have a 30-pack-year history of smoking and currently smoke or have quit within the past 15 years. Fecal occult blood test (FOBT) of the stool. You may have this test every year starting at age 77. Flexible sigmoidoscopy or colonoscopy. You may have a sigmoidoscopy every 5 years or a colonoscopy every 10 years starting at age 77. Hepatitis C blood test. Hepatitis B blood test. Sexually transmitted disease (STD) testing. Diabetes screening. This is done by checking your blood sugar (glucose) after you have not eaten for a while (fasting). You may have this done every 1-3 years. Bone density scan. This is done to screen for osteoporosis. You may have this done starting at age 77. Mammogram. This may be done every 1-2 years. Talk to your health care provider about how often you should have regular mammograms. Talk with your health care provider about your test results, treatment options, and if necessary, the need for more tests.  Vaccines  Your health care provider may recommend certain vaccines, such as: Influenza vaccine. This is  recommended every year. Tetanus, diphtheria, and acellular pertussis (Tdap, Td) vaccine. You may need a Td booster every 10 years. Zoster vaccine. You may need this after age 77. Pneumococcal 13-valent conjugate (PCV13) vaccine. One dose is recommended after age 77. Pneumococcal polysaccharide (PPSV23) vaccine. One dose is recommended after age 77. Talk to your health care provider about which screenings and vaccines you need and how often you need them. This information is not intended to replace advice given to you by your health care provider. Make sure you discuss any questions you have with your health care provider. Document Released: 12/26/2015 Document Revised: 08/18/2016 Document Reviewed: 09/30/2015 Elsevier Interactive Patient Education  2017 [employer] Prevention in the Home Falls can cause injuries. They can happen to people of all ages. There are many things you can do to make your home safe and to help prevent falls. What can I do on the outside of my home? Regularly fix the edges of walkways and driveways and fix any cracks. Remove anything that might make you trip as you walk through a door, such as a raised step or threshold. Trim any bushes or trees on the path to your home. Use bright outdoor lighting. Clear any walking paths of anything that might make someone trip, such as rocks or tools. Regularly check to see if handrails are loose or broken. Make sure that both sides of any steps have handrails. Any raised decks and porches should have guardrails on the edges. Have any leaves, snow, or ice cleared regularly. Use sand or salt on walking paths during winter. Clean up any spills in your garage right away. This includes oil or grease spills. What can I do in the bathroom? Use night lights. Install grab bars by the toilet and in the tub and shower. Do not use towel bars as grab bars. Use non-skid mats or decals in the tub or shower. If you need to sit down in  the shower, use a plastic, non-slip stool. Keep the floor dry. Clean up any water that spills on the floor as soon as it happens. Remove soap buildup in the tub or shower regularly. Attach bath mats securely with double-sided non-slip rug tape. Do not have throw rugs and other things on the floor that can make you trip. What can I do in the bedroom? Use night lights. Make sure that you have a light by your bed that is easy to reach. Do not use any sheets or blankets that are too big for your bed. They should not hang down onto the floor. Have a firm chair that has side arms. You can use this for support while you get dressed. Do not have throw rugs and other things on the floor that can make you trip. What can I do in the kitchen? Clean up any spills right away. Avoid walking on wet floors. Keep items that you use a lot in easy-to-reach places. If you need to reach something above you, use a strong step stool that has a grab bar. Keep electrical cords out of the way. Do not use floor polish or wax that makes floors slippery. If you must use wax, use non-skid floor wax. Do not have throw rugs and other things on the floor that can make you trip. What can I do with my stairs? Do not leave any items on the stairs. Make sure that  there are handrails on both sides of the stairs and use them. Fix handrails that are broken or loose. Make sure that handrails are as long as the stairways. Check any carpeting to make sure that it is firmly attached to the stairs. Fix any carpet that is loose or worn. Avoid having throw rugs at the top or bottom of the stairs. If you do have throw rugs, attach them to the floor with carpet tape. Make sure that you have a light switch at the top of the stairs and the bottom of the stairs. If you do not have them, ask someone to add them for you. What else can I do to help prevent falls? Wear shoes that: Do not have high heels. Have rubber bottoms. Are comfortable  and fit you well. Are closed at the toe. Do not wear sandals. If you use a stepladder: Make sure that it is fully opened. Do not climb a closed stepladder. Make sure that both sides of the stepladder are locked into place. Ask someone to hold it for you, if possible. Clearly mark and make sure that you can see: Any grab bars or handrails. First and last steps. Where the edge of each step is. Use tools that help you move around (mobility aids) if they are needed. These include: Canes. Walkers. Scooters. Crutches. Turn on the lights when you go into a dark area. Replace any light bulbs as soon as they burn out. Set up your furniture so you have a clear path. Avoid moving your furniture around. If any of your floors are uneven, fix them. If there are any pets around you, be aware of where they are. Review your medicines with your doctor. Some medicines can make you feel dizzy. This can increase your chance of falling. Ask your doctor what other things that you can do to help prevent falls. This information is not intended to replace advice given to you by your health care provider. Make sure you discuss any questions you have with your health care provider. Document Released: 09/25/2009 Document Revised: 05/06/2016 Document Reviewed: 01/03/2015 Elsevier Interactive Patient Education  2017 [employer].

## 2022-05-17 NOTE — Progress Notes (Signed)
Subjective:   Andrea Martinez is a 76 y.o. female who presents for Medicare Annual (Subsequent) preventive examination. Virtual Visit via Telephone Note  I connected with  Andrea Martinez on 05/17/22 at  8:15 AM EDT by telephone and verified that I am speaking with the correct person using two identifiers.  Location: Patient: HOME Provider: [facility] Persons participating in the virtual visit: patient/Nurse Health Advisor   I discussed the limitations, risks, security and privacy concerns of performing an evaluation and management service by telephone and the availability of in person appointments. The patient expressed understanding and agreed to proceed.  Interactive audio and video telecommunications were attempted between this nurse and patient, however failed, due to patient having technical difficulties OR patient did not have access to video capability.  We continued and completed visit with audio only.  Some vital signs may be absent or patient reported.   Andrea Driver, LPN  Review of Systems     Cardiac Risk Factors include: advanced age (>77mn, >>69women);dyslipidemia;sedentary lifestyle;obesity (BMI >30kg/m2);Other (see comment), Risk factor comments: Osteoarthritis.     Objective:    Today's Vitals   05/17/22 0817 05/17/22 0819  Weight: 202 lb (91.6 kg)   Height: '5\' 5"'$  (1.651 m)   PainSc:  4    Body mass index is 33.61 kg/m.     05/17/2022    8:26 AM 03/20/2022   12:16 PM 03/19/2022    1:00 PM 03/18/2022    4:38 PM 02/17/2022   10:43 AM 02/17/2022    1:07 AM 01/12/2022    2:02 PM  Advanced Directives  Does Patient Have a Medical Advance Directive? Yes No Yes Yes No No Yes  Type of AParamedicof [place] will Living will Living will Living will   [place] will  Does patient want to make changes to medical advance directive?   No - Patient declined    No - Patient declined  Copy of HWilderin  Chart? No - copy requested      No - copy requested  Would patient like information on creating a medical advance directive?  No - Patient declined   No - Patient declined No - Patient declined     Current Medications (verified) Outpatient Encounter Medications as of 05/17/2022  Medication Sig   acetaminophen (TYLENOL) 500 MG tablet Take 1,000 mg by mouth every 8 (eight) hours as needed for moderate pain.   alum & mag hydroxide-simeth (MAALOX/MYLANTA) 200-200-20 MG/5ML suspension Take 30 mLs by mouth every 6 (six) hours as needed for indigestion or heartburn.   butalbital-acetaminophen-caffeine (FIORICET) 50-325-40 MG tablet Take 1 tablet by mouth every 6 (six) hours as needed for headache or migraine.   calcium carbonate (TUMS - DOSED IN MG ELEMENTAL CALCIUM) 500 MG chewable tablet Chew 1 tablet by mouth daily.   cetirizine (ZYRTEC) 10 MG tablet Take 10 mg by mouth daily.   Cholecalciferol (VITAMIN D3) 50 MCG (2000 UT) TABS Take 2,000 Units by mouth daily in the afternoon.   citalopram (CELEXA) 20 MG tablet Take 1 tablet (20 mg total) by mouth daily.   dicyclomine (BENTYL) 10 MG capsule Take 1 capsule (10 mg total) by mouth 3 (three) times daily as needed for spasms.   FLUoxetine (PROZAC) 20 MG capsule fluoxetine 20 mg capsule  TAKE 1 CAPSULE BY MOUTH EVERY DAY   lactose free nutrition (BOOST) LIQD Take 237 mLs by mouth daily.   levothyroxine (SYNTHROID)  100 MCG tablet Take 1 tablet (100 mcg total) by mouth daily before breakfast.   loperamide (IMODIUM A-D) 2 MG tablet Take 2 mg by mouth 4 (four) times daily as needed for diarrhea or loose stools.   Multiple Vitamins-Minerals (CENTRUM SILVER PO) Take 1 tablet by mouth daily in the afternoon.   nilotinib (TASIGNA) 150 MG capsule Take 2 capsules (300 mg total) by mouth every 12 (twelve) hours.   ondansetron (ZOFRAN) 4 MG tablet Take 1 tablet (4 mg total) by mouth every 6 (six) hours as needed for nausea.   oxyCODONE (OXY IR/ROXICODONE) 5 MG  immediate release tablet Take 1 tablet (5 mg total) by mouth every 6 (six) hours as needed for moderate pain, breakthrough pain or severe pain.   Probiotic Product (PROBIOTIC DAILY PO) Take 2 tablets by mouth daily in the afternoon.   sucralfate (CARAFATE) 1 g tablet TAKE 1 TABLET (1 G TOTAL) BY MOUTH 4 TIMES A DAY WITH MEALS AND AT BEDTIME   pantoprazole (PROTONIX) 40 MG tablet Take 1 tablet (40 mg total) by mouth 2 (two) times daily.   No facility-administered encounter medications on file as of 05/17/2022.    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 Elyse Hsu   Lipoma    left upper arm   Mixed hyperlipidemia    Osteoporosis    Pericardial effusion    a. HCAP complicated by pericardial effusion requiring pericardial window 10/2016 and large L pleural effusion requring VATS.   Pleural effusion    a. s/p VATS 2017.   Pneumonia 10/2016   PONV (postoperative nausea and vomiting)    history of   Prolonged QT interval    Urine incontinence    UTI (lower urinary tract infection)    frequent   Vitamin D deficiency    Past Surgical History:  Procedure Laterality Date   BIOPSY  03/20/2022   Procedure: BIOPSY;  Surgeon: Eloise Harman, DO;  [facility];  Service: Endoscopy;;  gastric lesion vs. gastric ulcer   BRAVO [place] STUDY  12/04/2012   Procedure: BRAVO [place];  Surgeon: [name], MD;  [facility];  Service: Endoscopy;  Laterality: N/A;   CHOLECYSTECTOMY N/A 01/03/2014   Procedure: LAPAROSCOPIC CHOLECYSTECTOMY WITH INTRAOPERATIVE CHOLANGIOGRAM;  Surgeon: Joyice Faster. Cornett, MD;  Location: [facility];  Service: General;  Laterality: N/A;   COLONOSCOPY  01/2004   [facility], few small tics   ESOPHAGOGASTRODUODENOSCOPY  02/2010   gastric ulcers   ESOPHAGOGASTRODUODENOSCOPY  12/04/2012   ZOX:WRUEAVW gastritis (inflammation) was found in the gastric antrum; multiple biopsies The duodenal mucosa showed no abnormalities in the bulb and second portion of the duodenum   ESOPHAGOGASTRODUODENOSCOPY (EGD) WITH PROPOFOL N/A 03/20/2022   Procedure: ESOPHAGOGASTRODUODENOSCOPY (EGD) WITH PROPOFOL;  Surgeon: Eloise Harman, DO;  [facility];  Service: Endoscopy;  Laterality: N/A;   JOINT REPLACEMENT Right 10/22/2019   KYPHOPLASTY N/A 02/14/2019   Procedure: KYPHOPLASTY T12 and L1;  Surgeon: Melina Schools, MD;  Location: [facility];  Service: Orthopedics;  Laterality: N/A;  120 mins   LIPOMA EXCISION  08/02/2011  left shoulder   NOSE SURGERY     PARTIAL HYSTERECTOMY     vaginal at age 2 years of age   TOE 84 Left 1962   lt great toe   TOTAL HIP ARTHROPLASTY Right 11/18/2021   Procedure: TOTAL HIP ARTHROPLASTY ANTERIOR APPROACH;  Surgeon: Gaynelle Arabian, MD;  [facility];  Service: Orthopedics;  Laterality: Right;   TOTAL KNEE ARTHROPLASTY Right 10/22/2019   Procedure: TOTAL KNEE ARTHROPLASTY;  Surgeon: Gaynelle Arabian, MD;  [facility];  Service: Orthopedics;  Laterality: Right;  50mn   TOTAL KNEE ARTHROPLASTY Left 03/03/2020   Procedure: TOTAL KNEE ARTHROPLASTY;  Surgeon: AGaynelle Arabian MD;  [facility];  Service: Orthopedics;  Laterality: Left;  556m   TRANSFORAMINAL LUMBAR INTERBODY FUSION (TLIF) WITH PEDICLE SCREW FIXATION 1 LEVEL N/A 01/28/2021   Procedure: TRANSFORAMINAL LUMBAR INTERBODY FUSION (TLIF) LUMBAR FIVE-SACRAL ONE;  Surgeon: BrMelina SchoolsMD;  [place] [facility]: [place];  Laterality: N/A;  4 hrs   VIDEO ASSISTED THORACOSCOPY (VATS)/EMPYEMA Left 10/25/2016   Procedure: VIDEO ASSISTED THORACOSCOPY (VATS), BRONCH,DRAINAGE OF PLEURAL  EFFUSION,PERICARDIAL WINDOW WITH DRAINAGE OF PERICARDIAL FLUID, TEE;  Surgeon: StMelrose NakayamaMD;  [facility];  Service: Thoracic;  Laterality: Left;   VIDEO BRONCHOSCOPY N/A 10/25/2016   Procedure: VIDEO BRONCHOSCOPY;  Surgeon: StMelrose NakayamaMD;  [facility] [facility] Service: Thoracic;  Laterality: N/A;   Family History  Problem Relation Age of Onset   Lung cancer Father    COPD Mother    Stroke Mother    Thyroid disease Mother    Colon cancer Neg Hx    Anesthesia problems Neg Hx    Hypotension Neg Hx    Malignant hyperthermia Neg Hx    Pseudochol deficiency Neg Hx    Social History   Socioeconomic History   Marital status: Married    Spouse name: GeIona Beard Number of children: 2   Years of education: 12th grade   Highest education level: Not on file  Occupational History   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   2 granddaughters.   Retired [employer]   Social Determinants of Health   Financial Resource Strain: Low Risk    Difficulty of Paying Living Expenses: Not hard at all  Food Insecurity: No Food Insecurity   Worried About RuCharity fundraisern the Last Year: Never true   [place] the Last Year: Never true  Transportation Needs: No Transportation Needs   Lack of Transportation (Medical): No   Lack of Transportation (Non-Medical): No  Physical Activity: Sufficiently Active   Days of Exercise per Week: 5 days   Minutes of Exercise per Session: 30 min  Stress: No Stress Concern Present   Feeling of Stress : Not at all  Social Connections: Socially  Integrated   Frequency of Communication with Friends and Family: More than three times a week   Frequency of Social Gatherings with Friends and Family: More than three times a  week   Attends Religious Services: 1 to 4 times per year   Active Member of Clubs or Organizations: Yes   Attends Archivist Meetings: Never   Marital Status: Married    Tobacco Counseling Counseling given: Not Answered   Clinical Intake:  Pre-visit preparation completed: Yes  Pain : 0-10 Pain Score: 4  Pain Type: Chronic pain Pain Location: Hip Pain Descriptors / Indicators: Aching Pain Onset: More than a month ago Pain Frequency: Intermittent     BMI - recorded: 33.61 Nutritional Status: BMI > 30  Obese Nutritional Risks: None Diabetes: No  How often do you need to have someone help you when you read instructions, pamphlets, or other written materials from your doctor or pharmacy?: 1 - Never  Diabetic?NO  Interpreter Needed?: No  Information entered by :: mj Issam Carlyon, lpn   Activities of Daily Living    05/17/2022    8:27 AM 03/19/2022    9:55 AM  In your present state of health, do you have any difficulty performing the following activities:  Hearing? 0 0  Vision? 0 0  Difficulty concentrating or making decisions? 0 0  Walking or climbing stairs? 0 1  Dressing or bathing? 0 0  Doing errands, shopping? 0 1  Preparing Food and eating ? N   Using the Toilet? N   In the past six months, have you accidently leaked urine? N   Do you have problems with loss of bowel control? N   Managing your Medications? N   Managing your Finances? N   Housekeeping or managing your Housekeeping? N     Patient Care Team: Loman Brooklyn, FNP as PCP - General (Family Medicine) Derek Jack, MD as Medical Oncologist (Medical Oncology) Cassandria Anger, MD as Consulting Physician (Endocrinology) Melina Schools, MD as Consulting Physician (Orthopedic Surgery)  Indicate any recent  Medical Services you may have received from other than [facility] providers in the past year (date may be approximate).     Assessment:   This is a routine wellness examination for [facility].  Hearing/Vision screen Hearing Screening - Comments:: No hearing issues.  Vision Screening - Comments:: Readers. 2021. No current opthalmology.  Dietary issues and exercise activities discussed: Current Exercise Habits: Home exercise routine, Type of exercise: stretching;walking, Time (Minutes): 30, Frequency (Times/Week): 5, Weekly Exercise (Minutes/Week): 150, Intensity: Mild, Exercise limited by: cardiac condition(s)   Goals Addressed               This Visit's Progress     Increase physical activity (pt-stated)   On track     Get back to normal from back and knee surgeries.       Prevent falls   On track      Depression Screen    05/17/2022    8:23 AM 04/28/2022    1:47 PM 03/30/2022    9:26 AM 02/11/2022   11:18 AM 10/30/2021    2:22 PM 09/24/2021    2:03 PM 05/14/2021    8:32 AM  PHQ 2/9 Scores  PHQ - 2 Score 0  1 3 0 0 0  PHQ- 9 Score 0  6 10 0 2   Exception Documentation  Patient refusal         Fall Risk    05/17/2022    8:26 AM 03/30/2022    9:26 AM 02/11/2022   11:18 AM 02/11/2022   10:47 AM 10/30/2021    2:23 PM  Fall Risk   Falls in the past  year? 0 0 0 0 0  Number falls in past yr: 0   0   Injury with Fall? 0   0   Risk for fall due to : No Fall Risks      Follow up Falls prevention discussed        [place]:  Any stairs in or around the home? Yes  If so, are there any without handrails? No  Home free of loose throw rugs in walkways, pet beds, electrical cords, etc? Yes  Adequate lighting in your home to reduce risk of falls? Yes   ASSISTIVE DEVICES UTILIZED TO PREVENT FALLS:  Life alert? No  Use of a cane, walker or w/c? No  Grab bars in the bathroom? Yes  Shower chair or bench in shower?  Walk in shower Elevated toilet seat or a  handicapped toilet? Yes   TIMED UP AND GO:  Was the test performed? No . Phone visit.  Cognitive Function:        05/17/2022    8:29 AM 05/14/2021    8:36 AM  6CIT Screen  What Year? 0 points 0 points  What month? 0 points 0 points  What time? 0 points 0 points  Count back from 20 0 points 0 points  Months in reverse 0 points 0 points  Repeat phrase 0 points 0 points  Total Score 0 points 0 points    Immunizations Immunization History  Administered Date(s) Administered   Fluad Quad(high Dose 65+) 09/11/2020, 09/24/2021   Influenza,inj,Quad PF,6+ Mos 09/13/2017   Influenza-Unspecified 09/24/2015, 11/24/2017   Moderna Sars-Covid-2 Vaccination 04/02/2020, 04/30/2020, 01/07/2021   Pneumococcal Conjugate-13 10/09/2014   Pneumococcal Polysaccharide-23 10/09/2014    TDAP status: Due, Education has been provided regarding the importance of this vaccine. Advised may receive this vaccine at local pharmacy or Health Dept. Aware to provide a copy of the vaccination record if obtained from local pharmacy or Health Dept. Verbalized acceptance and understanding.  Flu Vaccine status: Up to date  Pneumococcal vaccine status: Up to date  Covid-19 vaccine status: Completed vaccines  Qualifies for Shingles Vaccine? Yes   Zostavax completed No   Shingrix Completed?: No.    Education has been provided regarding the importance of this vaccine. Patient has been advised to call insurance company to determine out of pocket expense if they have not yet received this vaccine. Advised may also receive vaccine at local pharmacy or Health Dept. Verbalized acceptance and understanding.  Screening Tests Health Maintenance  Topic Date Due   COVID-19 Vaccine (4 - Booster for Moderna series) 06/02/2022 (Originally 03/04/2021)   Zoster Vaccines- Shingrix (1 of 2) 06/29/2022 (Originally 01/13/1965)   TETANUS/TDAP  09/24/2022 (Originally 01/13/1965)   Hepatitis C Screening  09/24/2022 (Originally 01/12/1964)    MAMMOGRAM  09/04/2023   Pneumonia Vaccine 69+ Years old  Completed   DEXA SCAN  Completed   HPV VACCINES  Aged Out   COLONOSCOPY (Pts 45-51yr Insurance coverage will need to be confirmed)  Discontinued    Health Maintenance  There are no preventive care reminders to display for this patient.   Colorectal cancer screening: No longer required.   Mammogram status: Completed 09/03/2021. Repeat every year  Bone Density status: Completed 05/08/2019. Results reflect: Bone density results: OSTEOPENIA. Repeat every 2 years.  Lung Cancer Screening: (Low Dose CT Chest recommended if Age 77-80years, 30 pack-year currently smoking OR have quit w/in 15years.) does not qualify.  Additional Screening:  Hepatitis C Screening: does  qualify; Completed due  Vision Screening: Recommended annual ophthalmology exams for early detection of glaucoma and other disorders of the eye. Is the patient up to date with their annual eye exam?  No  Who is the provider or what is the name of the office in which the patient attends annual eye exams? N/A but states she will call [facility] [place] to set up appointment. If pt is not established with a provider, would they like to be referred to a provider to establish care? No .   Dental Screening: Recommended annual dental exams for proper oral hygiene  Community Resource Referral / Chronic Care Management: CRR required this visit?  No   CCM required this visit?  No      Plan:     I have personally reviewed and noted the following in the patient's chart:   Medical and social history Use of alcohol, tobacco or illicit drugs  Current medications and supplements including opioid prescriptions.  Functional ability and status Nutritional status Physical activity Advanced directives List of other physicians Hospitalizations, surgeries, and ER visits in previous 12 months Vitals Screenings to include cognitive, depression, and falls Referrals and  appointments  In addition, I have reviewed and discussed with patient certain preventive protocols, quality metrics, and best practice recommendations. A written personalized care plan for preventive services as well as general preventive health recommendations were provided to patient.     Rakiya Krawczyk, LPN   03/14/7407   Nurse Notes: Discussed Shingrix and Tdap and how to obtain.

## 2022-05-21 ENCOUNTER — Other Ambulatory Visit: Payer: Self-pay | Admitting: Family Medicine

## 2022-05-21 ENCOUNTER — Other Ambulatory Visit: Payer: Self-pay | Admitting: Internal Medicine

## 2022-05-21 DIAGNOSIS — F419 Anxiety disorder, unspecified: Secondary | ICD-10-CM

## 2022-05-21 DIAGNOSIS — F320 Major depressive disorder, single episode, mild: Secondary | ICD-10-CM

## 2022-06-09 ENCOUNTER — Ambulatory Visit: Payer: PPO | Admitting: Family Medicine

## 2022-06-15 ENCOUNTER — Other Ambulatory Visit: Payer: Self-pay | Admitting: Internal Medicine

## 2022-06-17 ENCOUNTER — Telehealth: Payer: Self-pay

## 2022-06-17 NOTE — Telephone Encounter (Signed)
Andrea Martinez I had you to [place] for this pt to have a repeat EGD in 8 to 10 weeks. Pt called upset that it has been 12 weeks and no one has called her. This would be in 03/20/2022 lab note. Please call this pt I will include Mindy in this note as well so she will know also

## 2022-06-17 NOTE — Telephone Encounter (Signed)
Dr. Abbey Chatters, please advise pt ASA thanks

## 2022-06-18 NOTE — Telephone Encounter (Signed)
noted 

## 2022-06-22 ENCOUNTER — Ambulatory Visit (INDEPENDENT_AMBULATORY_CARE_PROVIDER_SITE_OTHER): Payer: PPO | Admitting: Family Medicine

## 2022-06-22 ENCOUNTER — Encounter: Payer: Self-pay | Admitting: Family Medicine

## 2022-06-22 VITALS — BP 128/68 | HR 87 | Temp 96.3°F | Ht 65.0 in | Wt 201.0 lb

## 2022-06-22 DIAGNOSIS — F320 Major depressive disorder, single episode, mild: Secondary | ICD-10-CM

## 2022-06-22 DIAGNOSIS — F419 Anxiety disorder, unspecified: Secondary | ICD-10-CM | POA: Diagnosis not present

## 2022-06-22 NOTE — Progress Notes (Signed)
Assessment & Plan:  1. Major depressive disorder, single episode, mild degree (HCC) Well controlled on current regimen.   2. Anxiety Well controlled on current regimen.    Return in about 4 months (around 10/23/2022) for follow-up of chronic medication conditions with whomever.  Hendricks Limes, MSN, APRN, FNP-C [facility]  Subjective:    Patient ID: Andrea Martinez, female    DOB: 1946/07/21, 76 y.o.   MRN: [id number]  Patient Care Team: Loman Brooklyn, FNP as PCP - General (Family Medicine) Derek Jack, MD as Medical Oncologist (Medical Oncology) Cassandria Anger, MD as Consulting Physician (Endocrinology) Melina Schools, MD as Consulting Physician (Orthopedic Surgery)   Chief Complaint:  Chief Complaint  Patient presents with   Anxiety   Depression    6 week follow up- patient states it is a little better     HPI: Andrea Martinez is a 76 y.o. female presenting on 06/22/2022 for Anxiety and Depression (6 week follow up- patient states it is a little better )  Depression/Anxiety: patient was switched from Prozac to Celexa almost two months ago. She reports since starting it her previous symptoms of sleeping at the wrong times, shaking, lashing out at her husband, and confusion have resolved. She feels she is doing better and does not desire an increase in dosage.      06/22/2022   11:07 AM 05/17/2022    8:23 AM 03/30/2022    9:26 AM  Depression screen PHQ 2/9  Decreased Interest 1 0 1  Down, Depressed, Hopeless 0 0 0  PHQ - 2 Score 1 0 1  Altered sleeping 1 0 1  Tired, decreased energy 2 0 1  Change in appetite 1 0 2  Feeling bad or failure about yourself  0 0 0  Trouble concentrating 0 0 1  Moving slowly or fidgety/restless 0 0 0  Suicidal thoughts 0 0 0  PHQ-9 Score 5 0 6  Difficult doing work/chores Not difficult at all  Not difficult at all      06/22/2022   11:07 AM 03/30/2022    9:26 AM 02/11/2022   11:18 AM 10/30/2021     2:22 PM  GAD 7 : Generalized Anxiety Score  Nervous, Anxious, on Edge 1 0 1 0  Control/stop worrying 1 0 1 0  Worry too much - different things 0 1 1 0  Trouble relaxing 0 0 0 0  Restless 0 0 0 0  Easily annoyed or irritable 0 0 0 0  Afraid - awful might happen 0 0 0 0  Total GAD 7 Score '2 1 3 ' 0  Anxiety Difficulty Not difficult at all Not difficult at all Not difficult at all Not difficult at all    New complaints: None   Social history:  Relevant past medical, surgical, family and social history reviewed and updated as indicated. Interim medical history since our last visit reviewed.  Allergies and medications reviewed and updated.  DATA REVIEWED: CHART IN EPIC  ROS: Negative unless specifically indicated above in HPI.    Current Outpatient Medications:    acetaminophen (TYLENOL) 500 MG tablet, Take 1,000 mg by mouth every 8 (eight) hours as needed for moderate pain., Disp: , Rfl:    alum & mag hydroxide-simeth (MAALOX/MYLANTA) 200-200-20 MG/5ML suspension, Take 30 mLs by mouth every 6 (six) hours as needed for indigestion or heartburn., Disp: , Rfl:    butalbital-acetaminophen-caffeine (FIORICET) 50-325-40 MG tablet, Take 1 tablet by mouth every 6 (six)  hours as needed for headache or migraine., Disp: 14 tablet, Rfl: 0   calcium carbonate (TUMS - DOSED IN MG ELEMENTAL CALCIUM) 500 MG chewable tablet, Chew 1 tablet by mouth daily., Disp: , Rfl:    cetirizine (ZYRTEC) 10 MG tablet, Take 10 mg by mouth daily., Disp: , Rfl:    Cholecalciferol (VITAMIN D3) 50 MCG (2000 UT) TABS, Take 2,000 Units by mouth daily in the afternoon., Disp: , Rfl:    citalopram (CELEXA) 20 MG tablet, TAKE 1 TABLET BY MOUTH EVERY DAY, Disp: 90 tablet, Rfl: 0   dicyclomine (BENTYL) 10 MG capsule, Take 1 capsule (10 mg total) by mouth 3 (three) times daily as needed for spasms., Disp: 30 capsule, Rfl: 1   FLUoxetine (PROZAC) 20 MG capsule, fluoxetine 20 mg capsule  TAKE 1 CAPSULE BY MOUTH EVERY DAY, Disp: ,  Rfl:    lactose free nutrition (BOOST) LIQD, Take 237 mLs by mouth daily., Disp: , Rfl:    levothyroxine (SYNTHROID) 100 MCG tablet, Take 1 tablet (100 mcg total) by mouth daily before breakfast., Disp: 30 tablet, Rfl: 2   loperamide (IMODIUM A-D) 2 MG tablet, Take 2 mg by mouth 4 (four) times daily as needed for diarrhea or loose stools., Disp: , Rfl:    Multiple Vitamins-Minerals (CENTRUM SILVER PO), Take 1 tablet by mouth daily in the afternoon., Disp: , Rfl:    nilotinib (TASIGNA) 150 MG capsule, Take 2 capsules (300 mg total) by mouth every 12 (twelve) hours., Disp: 120 capsule, Rfl: 6   ondansetron (ZOFRAN) 4 MG tablet, Take 1 tablet (4 mg total) by mouth every 6 (six) hours as needed for nausea., Disp: 20 tablet, Rfl: 0   oxyCODONE (OXY IR/ROXICODONE) 5 MG immediate release tablet, Take 1 tablet (5 mg total) by mouth every 6 (six) hours as needed for moderate pain, breakthrough pain or severe pain., Disp: 10 tablet, Rfl: 0   Probiotic Product (PROBIOTIC DAILY PO), Take 2 tablets by mouth daily in the afternoon., Disp: , Rfl:    sucralfate (CARAFATE) 1 g tablet, TAKE 1 TABLET (1 G TOTAL) BY MOUTH 4 TIMES A DAY WITH MEALS AND AT BEDTIME, Disp: 120 tablet, Rfl: 1   pantoprazole (PROTONIX) 40 MG tablet, Take 1 tablet (40 mg total) by mouth 2 (two) times daily., Disp: 60 tablet, Rfl: 0   Allergies  Allergen Reactions   Nucynta [Tapentadol Hcl] Swelling   Codeine Nausea Only and Other (See Comments)    Headache   Sulfonamide Derivatives Nausea Only and Other (See Comments)    Headache   Sulfa Antibiotics Nausea Only   Esomeprazole Magnesium Nausea Only   Fluconazole Rash    Redness and blistering on left thigh   Past Medical History:  Diagnosis Date   Anxiety    denies   Arthritis    Chronic myelogenous leukemia (CML), 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 Elyse Hsu    Lipoma    left upper arm   Mixed hyperlipidemia    Osteoporosis    Pericardial effusion    a. HCAP complicated by pericardial effusion requiring pericardial window 10/2016 and large L pleural effusion requring VATS.   Pleural effusion    a. s/p VATS 2017.   Pneumonia 10/2016   PONV (postoperative nausea and vomiting)    history of   Prolonged QT interval    Urine incontinence  UTI (lower urinary tract infection)    frequent   Vitamin D deficiency     Past Surgical History:  Procedure Laterality Date   BIOPSY  03/20/2022   Procedure: BIOPSY;  Surgeon: Eloise Harman, DO;  [facility];  Service: Endoscopy;;  gastric lesion vs. gastric ulcer   BRAVO [place] STUDY  12/04/2012   Procedure: BRAVO [place];  Surgeon: [name], MD;  [facility];  Service: Endoscopy;  Laterality: N/A;   CHOLECYSTECTOMY N/A 01/03/2014   Procedure: LAPAROSCOPIC CHOLECYSTECTOMY WITH INTRAOPERATIVE CHOLANGIOGRAM;  Surgeon: Joyice Faster. Cornett, MD;  Location: [place];  Service: General;  Laterality: N/A;   COLONOSCOPY  01/2004   [facility], few small tics   ESOPHAGOGASTRODUODENOSCOPY  02/2010   gastric ulcers   ESOPHAGOGASTRODUODENOSCOPY  12/04/2012   KMM:NOTRRNH gastritis (inflammation) was found in the gastric antrum; multiple biopsies The duodenal mucosa showed no abnormalities in the bulb and second portion of the duodenum   ESOPHAGOGASTRODUODENOSCOPY (EGD) WITH PROPOFOL N/A 03/20/2022   Procedure: ESOPHAGOGASTRODUODENOSCOPY (EGD) WITH PROPOFOL;  Surgeon: Eloise Harman, DO;  [facility];  Service: Endoscopy;  Laterality: N/A;   JOINT REPLACEMENT Right 10/22/2019   KYPHOPLASTY N/A 02/14/2019   Procedure: KYPHOPLASTY T12 and L1;  Surgeon: Melina Schools, MD;  Location: [facility];  Service: Orthopedics;  Laterality: N/A;  120 mins   LIPOMA EXCISION  08/02/2011   left shoulder   NOSE SURGERY     PARTIAL HYSTERECTOMY     vaginal at age 48 years of age   TOE  39 Left 1962   lt great toe   TOTAL HIP ARTHROPLASTY Right 11/18/2021   Procedure: TOTAL HIP ARTHROPLASTY ANTERIOR APPROACH;  Surgeon: Gaynelle Arabian, MD;  [facility];  Service: Orthopedics;  Laterality: Right;   TOTAL KNEE ARTHROPLASTY Right 10/22/2019   Procedure: TOTAL KNEE ARTHROPLASTY;  Surgeon: Gaynelle Arabian, MD;  [facility];  Service: Orthopedics;  Laterality: Right;  81mn   TOTAL KNEE ARTHROPLASTY Left 03/03/2020   Procedure: TOTAL KNEE ARTHROPLASTY;  Surgeon: AGaynelle Arabian MD;  [facility];  Service: Orthopedics;  Laterality: Left;  580m   TRANSFORAMINAL LUMBAR INTERBODY FUSION (TLIF) WITH PEDICLE SCREW FIXATION 1 LEVEL N/A 01/28/2021   Procedure: TRANSFORAMINAL LUMBAR INTERBODY FUSION (TLIF) LUMBAR FIVE-SACRAL ONE;  Surgeon: BrMelina SchoolsMD;  [place] [facility] Service: Orthopedics;  Laterality: N/A;  4 hrs   VIDEO ASSISTED THORACOSCOPY (VATS)/EMPYEMA Left 10/25/2016   Procedure: VIDEO ASSISTED THORACOSCOPY (VATS), BRONCH,DRAINAGE OF PLEURAL EFFUSION,PERICARDIAL WINDOW WITH DRAINAGE OF PERICARDIAL FLUID, TEE;  Surgeon: StMelrose NakayamaMD;  [place] Service: Thoracic;  Laterality: Left;   VIDEO BRONCHOSCOPY N/A 10/25/2016   Procedure: VIDEO BRONCHOSCOPY;  Surgeon: StMelrose NakayamaMD;  [place] Service: Thoracic;  Laterality: N/A;    Social History   Socioeconomic History   Marital status: Married    Spouse name: GeIona Beard Number of children: 2   Years of education: 12th grade   Highest education level: Not on file  Occupational History   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   2 granddaughters.   Retired [employer].    Social Determinants of Health   Financial Resource Strain: Low Risk  (05/17/2022)   Overall Financial Resource Strain (CARDIA)    Difficulty of Paying Living Expenses: Not hard at all  Food Insecurity: No Food Insecurity (05/17/2022)   Hunger Vital Sign    Worried About Running Out of Food in the Last Year: Never true    Ran Out of Food in the Last Year: Never true  Transportation Needs: No Transportation Needs (05/17/2022)   PRAPARE - Hydrologist (Medical): No    Lack of Transportation (Non-Medical): No  Physical Activity: Sufficiently Active (05/17/2022)   Exercise Vital Sign    Days of Exercise per Week: 5 days    Minutes of Exercise per Session: 30 min  Stress: No Stress Concern Present (05/17/2022)   [place]    Feeling of Stress : Not at all  Social Connections: [place] (05/17/2022)   Social Connection and Isolation Panel [NHANES]    Frequency of Communication with Friends and Family: More than three times a week    Frequency of Social Gatherings with Friends and Family: More than three times a week    Attends Religious Services: 1 to 4 times per year    Active Member of [employer] or Organizations: Yes    Attends Archivist Meetings: Never    Marital Status: Married  Human resources officer Violence: Not At Risk (05/17/2022)   Humiliation, Afraid, Rape, and Kick questionnaire    Fear of Current or Ex-Partner: No    Emotionally Abused: No    Physically Abused: No    Sexually Abused: No        Objective:    BP 128/68   Pulse 87   Temp (!) 96.3 F (35.7 C) (Temporal)   Ht '5\' 5"'  (1.651 m)   Wt 201 lb (91.2 kg)   SpO2 96%   BMI 33.45 kg/m   Wt Readings from Last 3 Encounters:  06/22/22 201 lb (91.2 kg)  05/17/22 202 lb (91.6 kg)  04/28/22 202 lb 12.8 oz (92 kg)    Physical  Exam Vitals reviewed.  Constitutional:      General: She is not in acute distress.    Appearance: Normal appearance. She is obese. She is not ill-appearing, toxic-appearing or diaphoretic.  HENT:     Head: Normocephalic and atraumatic.  Eyes:     General: No scleral icterus.       Right eye: No discharge.        Left eye: No discharge.     Conjunctiva/sclera: Conjunctivae normal.  Cardiovascular:     Rate and Rhythm: Normal rate.  Pulmonary:     Effort: Pulmonary effort is normal. No respiratory distress.  Musculoskeletal:        General: Normal range of motion.     Cervical back: Normal range of motion.  Skin:    General: Skin is warm and dry.     Capillary Refill: Capillary refill takes less than 2 seconds.  Neurological:     General: No focal deficit present.     Mental Status: She is alert and oriented to person, place, and time. Mental status is at baseline.  Psychiatric:        Mood and Affect:  Mood normal.        Behavior: Behavior normal.        Thought Content: Thought content normal.        Judgment: Judgment normal.     Lab Results  Component Value Date   TSH 0.040 (L) 04/28/2022   Lab Results  Component Value Date   WBC 10.8 04/28/2022   HGB 11.3 04/28/2022   HCT 35.3 04/28/2022   MCV 84 04/28/2022   PLT 367 04/28/2022   Lab Results  Component Value Date   NA 141 04/28/2022   K 5.1 04/28/2022   CO2 23 04/28/2022   GLUCOSE 94 04/28/2022   BUN 24 04/28/2022   CREATININE 1.00 04/28/2022   BILITOT 0.4 04/28/2022   ALKPHOS 205 (H) 04/28/2022   AST 31 04/28/2022   ALT 20 04/28/2022   PROT 7.4 04/28/2022   ALBUMIN 4.4 04/28/2022   CALCIUM 9.6 04/28/2022   ANIONGAP 7 03/21/2022   EGFR 58 (L) 04/28/2022   Lab Results  Component Value Date   CHOL 222 (H) 04/28/2022   Lab Results  Component Value Date   HDL 57 04/28/2022   Lab Results  Component Value Date   LDLCALC 124 (H) 04/28/2022   Lab Results  Component Value Date   TRIG 233 (H)  04/28/2022   Lab Results  Component Value Date   CHOLHDL 3.9 04/28/2022   Lab Results  Component Value Date   HGBA1C 5.1 10/30/2021

## 2022-06-23 ENCOUNTER — Encounter: Payer: Self-pay | Admitting: Gastroenterology

## 2022-06-23 ENCOUNTER — Encounter (INDEPENDENT_AMBULATORY_CARE_PROVIDER_SITE_OTHER): Payer: Self-pay

## 2022-06-23 ENCOUNTER — Other Ambulatory Visit (INDEPENDENT_AMBULATORY_CARE_PROVIDER_SITE_OTHER): Payer: Self-pay

## 2022-06-23 ENCOUNTER — Ambulatory Visit (INDEPENDENT_AMBULATORY_CARE_PROVIDER_SITE_OTHER): Payer: PPO | Admitting: Gastroenterology

## 2022-06-23 DIAGNOSIS — K279 Peptic ulcer, site unspecified, unspecified as acute or chronic, without hemorrhage or perforation: Secondary | ICD-10-CM | POA: Diagnosis not present

## 2022-06-23 MED ORDER — PANTOPRAZOLE SODIUM 40 MG PO TBEC: 40.0000 mg | DELAYED_RELEASE_TABLET | Freq: Every day | ORAL | 3 refills | Status: DC

## 2022-06-23 NOTE — Progress Notes (Signed)
Gastroenterology Office Note     Primary Care Physician:  Baruch Gouty, FNP  Primary Gastroenterologist: Dr. Abbey Chatters   Chief Complaint   Chief Complaint  Patient presents with   Follow-up    Patient here today for a follow up visit. She denies any current gi issues, but she says she is still on a bland diet after recent hospitalization 03/18/2022 due to a duodenal ulcer and abdominal pain. She is still taking carafate 1 g up to QID, and she is not taking the pantoprazole 40 mg since she ran out.      History of Present Illness   Andrea Martinez is a 76 y.o. female presenting today in follow-up with a history of PUD. She was hospitalized in April 2023 with CT A/P with contrast noting duodenitis with contained duodenal perforation. She had outpatient EGD April 2023: small hiatal hernia, non-bleeding gastric ulcer without stigmata of bleeding. Normal duodenum. Nodular mucosa. Due for surveillance. Negative H.pylori on path and negative H.pylori serology.   No abdominal pain, N/V, changes in bowel habits, constipation, diarrhea, overt GI bleeding, GERD, dysphagia, unexplained weight loss, lack of appetite, unexplained weight gain.   She has followed a bland diet since April 2023. She is eager to eat regular foods. She took pantoprazole for about a month following procedure but then did not continue as she states she had no refills.    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 Elyse Hsu   Lipoma    left upper arm   Mixed hyperlipidemia    Osteoporosis    Pericardial effusion    a. HCAP complicated by pericardial effusion requiring pericardial window 10/2016 and large L pleural effusion requring VATS.   Pleural effusion    a. s/p VATS 2017.    Pneumonia 10/2016   PONV (postoperative nausea and vomiting)    history of   Prolonged QT interval    Urine incontinence    UTI (lower urinary tract infection)    frequent   Vitamin D deficiency     Past Surgical History:  Procedure Laterality Date   BIOPSY  03/20/2022   Procedure: BIOPSY;  Surgeon: Eloise Harman, DO;  [facility];  Service: Endoscopy;;  gastric lesion vs. gastric ulcer   BRAVO [place] STUDY  12/04/2012   Procedure: BRAVO [place];  Surgeon: [name], MD;  [facility];  Service: Endoscopy;  Laterality: N/A;   CHOLECYSTECTOMY N/A 01/03/2014   Procedure: LAPAROSCOPIC CHOLECYSTECTOMY WITH INTRAOPERATIVE CHOLANGIOGRAM;  Surgeon: Joyice Faster. Cornett, MD;  Location: [place];  Service: General;  Laterality: N/A;   COLONOSCOPY  01/2004   DR [facility], few small tics   ESOPHAGOGASTRODUODENOSCOPY  02/2010   gastric ulcers   ESOPHAGOGASTRODUODENOSCOPY  12/04/2012   [id number] gastritis (inflammation) was found in the gastric antrum; multiple biopsies The duodenal mucosa showed no abnormalities in the bulb and second portion of the duodenum   ESOPHAGOGASTRODUODENOSCOPY (EGD) WITH PROPOFOL N/A 03/20/2022   Procedure: ESOPHAGOGASTRODUODENOSCOPY (EGD) WITH PROPOFOL;  Surgeon: Eloise Harman, DO;  [facility];  Service: Endoscopy;  Laterality: N/A;   JOINT REPLACEMENT Right 10/22/2019   KYPHOPLASTY N/A 02/14/2019  Procedure: KYPHOPLASTY T12 and L1;  Surgeon: Melina Schools, MD;  Location: [place];  Service: Orthopedics;  Laterality: N/A;  120 mins   LIPOMA EXCISION  08/02/2011   left shoulder   NOSE SURGERY     PARTIAL HYSTERECTOMY     vaginal at age 61 years of age   TOE 50 Left 1962   lt great toe   TOTAL HIP ARTHROPLASTY Right 11/18/2021   Procedure: TOTAL HIP ARTHROPLASTY ANTERIOR APPROACH;  Surgeon: Gaynelle Arabian, MD;  [facility];  Service: Orthopedics;  Laterality: Right;   TOTAL KNEE ARTHROPLASTY Right  10/22/2019   Procedure: TOTAL KNEE ARTHROPLASTY;  Surgeon: Gaynelle Arabian, MD;  [facility];  Service: Orthopedics;  Laterality: Right;  49mn   TOTAL KNEE ARTHROPLASTY Left 03/03/2020   Procedure: TOTAL KNEE ARTHROPLASTY;  Surgeon: AGaynelle Arabian MD;  [facility];  Service: Orthopedics;  Laterality: Left;  559m   TRANSFORAMINAL LUMBAR INTERBODY FUSION (TLIF) WITH PEDICLE SCREW FIXATION 1 LEVEL N/A 01/28/2021   Procedure: TRANSFORAMINAL LUMBAR INTERBODY FUSION (TLIF) LUMBAR FIVE-SACRAL ONE;  Surgeon: BrMelina SchoolsMD;  [facility] Service: Orthopedics;  Laterality: N/A;  4 hrs   VIDEO ASSISTED THORACOSCOPY (VATS)/EMPYEMA Left 10/25/2016   Procedure: VIDEO ASSISTED THORACOSCOPY (VATS), BRONCH,DRAINAGE OF PLEURAL EFFUSION,PERICARDIAL WINDOW WITH DRAINAGE OF PERICARDIAL FLUID, TEE;  Surgeon: StMelrose NakayamaMD;  [facility] Service: Thoracic;  Laterality: Left;   VIDEO BRONCHOSCOPY N/A 10/25/2016   Procedure: VIDEO BRONCHOSCOPY;  Surgeon: StMelrose NakayamaMD;  [place] [facility] Service: Thoracic;  Laterality: N/A;    Current Outpatient Medications  Medication Sig Dispense Refill   acetaminophen (TYLENOL) 500 MG tablet Take 1,000 mg by mouth every 8 (eight) hours as needed for moderate pain.     cetirizine (ZYRTEC) 10 MG tablet Take 10 mg by mouth daily.     Cholecalciferol (VITAMIN D3) 50 MCG (2000 UT) TABS Take 2,000 Units by mouth daily in the afternoon.     FLUoxetine (PROZAC) 20 MG capsule fluoxetine 20 mg capsule  TAKE 1 CAPSULE BY MOUTH EVERY DAY     lactose free nutrition (BOOST) LIQD Take 237 mLs by mouth daily.     levothyroxine (SYNTHROID) 100 MCG tablet Take 1 tablet (100 mcg total) by mouth daily before breakfast. 30 tablet 2   Multiple Vitamins-Minerals (CENTRUM SILVER PO) Take 1 tablet by mouth daily in the afternoon.     nilotinib (TASIGNA) 150 MG capsule Take 2 capsules (300 mg total) by mouth every 12 (twelve) hours. 120 capsule 6   Probiotic  Product (PROBIOTIC DAILY PO) Take 2 tablets by mouth daily in the afternoon.     sucralfate (CARAFATE) 1 g tablet TAKE 1 TABLET (1 G TOTAL) BY MOUTH 4 TIMES A DAY WITH MEALS AND AT BEDTIME 120 tablet 1   pantoprazole (PROTONIX) 40 MG tablet Take 1 tablet (40 mg total) by mouth 2 (two) times daily. (Patient not taking: Reported on 06/23/2022) 60 tablet 0   No current facility-administered medications for this visit.    Allergies as of 06/23/2022 - Review Complete 06/23/2022  Allergen Reaction Noted   Nucynta [tapentadol hcl] Swelling 12/31/2020   Codeine Nausea Only and Other (See Comments) 02/11/2010   Sulfonamide derivatives Nausea Only and Other (See Comments) 02/11/2010   Sulfa antibiotics Nausea Only 06/22/2022   Esomeprazole magnesium Nausea Only 05/20/2010   Fluconazole Rash 12/10/2015    Family History  Problem Relation Age of Onset   Lung cancer Father  COPD Mother    Stroke Mother    Thyroid disease Mother    Colon cancer Neg Hx    Anesthesia problems Neg Hx    Hypotension Neg Hx    Malignant hyperthermia Neg Hx    Pseudochol deficiency Neg Hx     Social History   Socioeconomic History   Marital status: Married    Spouse name: [name]   Number of children: 2   Years of education: 12th grade   Highest education level: Not on file  Occupational History   Occupation: retired    Fish farm manager: UENPLOYED    Employer: RETIRED  Tobacco Use   Smoking status: Never   Smokeless tobacco: Never  Vaping Use   Vaping Use: Never used  Substance and Sexual Activity   Alcohol use: No    Alcohol/week: 0.0 standard drinks of alcohol   Drug use: No   Sexual activity: Yes    Partners: Male    Birth control/protection: None  Other Topics Concern   Not on file  Social History Narrative   1 son, 1 daughter.   2 granddaughters.   Retired [employer].    Social Determinants of Health   Financial Resource Strain: Low Risk  (05/17/2022)   Overall Financial Resource Strain (CARDIA)     Difficulty of Paying Living Expenses: Not hard at all  Food Insecurity: No Food Insecurity (05/17/2022)   Hunger Vital Sign    Worried About Running Out of Food in the Last Year: Never true    Ran Out of Food in the Last Year: Never true  Transportation Needs: No Transportation Needs (05/17/2022)   PRAPARE - Hydrologist (Medical): No    Lack of Transportation (Non-Medical): No  Physical Activity: Sufficiently Active (05/17/2022)   Exercise Vital Sign    Days of Exercise per Week: 5 days    Minutes of Exercise per Session: 30 min  Stress: No Stress Concern Present (05/17/2022)   [place]    Feeling of Stress : Not at all  Social Connections: [place] (05/17/2022)   Social Connection and Isolation Panel [NHANES]    Frequency of Communication with Friends and Family: More than three times a week    Frequency of Social Gatherings with Friends and Family: More than three times a week    Attends Religious Services: 1 to 4 times per year    Active Member of [employer] or Organizations: Yes    Attends Archivist Meetings: Never    Marital Status: Married  Human resources officer Violence: Not At Risk (05/17/2022)   Humiliation, Afraid, Rape, and Kick questionnaire    Fear of Current or Ex-Partner: No    Emotionally Abused: No    Physically Abused: No    Sexually Abused: No     Review of Systems   Gen: Denies any fever, chills, fatigue, weight loss, lack of appetite.  CV: Denies chest pain, heart palpitations, peripheral edema, syncope.  Resp: Denies shortness of breath at rest or with exertion. Denies wheezing or cough.  GI: see HPI GU : Denies urinary burning, urinary frequency, urinary hesitancy MS: Denies joint pain, muscle weakness, cramps, or limitation of movement.  Derm: Denies rash, itching, dry skin Psych: Denies depression, anxiety, memory loss, and confusion Heme: Denies  bruising, bleeding, and enlarged lymph nodes.   Physical Exam   BP 138/75 (BP Location: Left Arm, Patient Position: Sitting, Cuff Size: Large)   Pulse 96  Temp 98.7 F (37.1 C) (Oral)   Ht '5\' 5"'$  (1.651 m)   Wt 200 lb 12.8 oz (91.1 kg)   BMI 33.41 kg/m  General:   Alert and oriented. Pleasant and cooperative. Well-nourished and well-developed.  Head:  Normocephalic and atraumatic. Eyes:  Without icterus Abdomen:  +BS, soft, non-tender and non-distended. No HSM noted. No guarding or rebound. No masses appreciated.  Rectal:  Deferred  Msk:  Symmetrical without gross deformities. Normal posture. Extremities:  Without edema. Neurologic:  Alert and  oriented x4;  grossly normal neurologically. Skin:  Intact without significant lesions or rashes. Psych:  Alert and cooperative. Normal mood and affect.   Assessment   Andrea Martinez is a 76 y.o. female presenting today in follow-up with a history of PUD, hospitalized in April 2023 with CT A/P with contrast noting duodenitis with contained duodenal perforation. She had outpatient EGD April 2023: small hiatal hernia, non-bleeding gastric ulcer without stigmata of bleeding. Normal duodenum. Nodular mucosa. Due for surveillance. Negative H.pylori on path and negative H.pylori serology.    She is doing well today. Unfortunately, she had been off of pantoprazole for several months now and just took for one month. I have refilled this for her now. She has also strictly been eating a bland diet; we discussed she can eat more well rounded now at this time.     PLAN    Proceed with upper endoscopy by Dr. Abbey Chatters in near future: the risks, benefits, and alternatives have been discussed with the patient in detail. The patient states understanding and desires to proceed.  Pantoprazole once daily Further recommendations to follow    Annitta Needs, PhD, ANP-BC [facility]

## 2022-06-23 NOTE — Patient Instructions (Signed)
I have sent in pantoprazole to take once daily, 30 minutes before eating.  We are arranging an upper endoscopy to check ulcer healing!  Further recommendations to follow!  It was a pleasure to see you today. I want to create trusting relationships with patients to provide genuine, compassionate, and quality care. I value your feedback. If you receive a survey regarding your visit,  I greatly appreciate you taking time to fill this out.   Annitta Needs, PhD, ANP-BC [facility]

## 2022-06-23 NOTE — Patient Instructions (Signed)
Andrea Martinez  06/23/2022     '@PREFPERIOPPHARMACY'$ @   Your procedure is scheduled on  06/28/2022.   Report to [facility] at  [phone]  A.M.   Call this number if you have problems the morning of surgery:  [phone]   Remember:  Follow the diet instructions given to you by the office.    Take these medicines the morning of surgery with A SIP OF WATER                          zyrtec, prozac, protonix.     Do not wear jewelry, make-up or nail polish.  Do not wear lotions, powders, or perfumes, or deodorant.  Do not shave 48 hours prior to surgery.  Men may shave face and neck.  Do not bring valuables to the hospital.  [facility] is not responsible for any belongings or valuables.  Contacts, dentures or bridgework may not be worn into surgery.  Leave your suitcase in the car.  After surgery it may be brought to your room.  For patients admitted to the hospital, discharge time will be determined by your treatment team.  Patients discharged the day of surgery will not be allowed to drive home and must have someone with them for 24 hours.    Special instructions:   DO NOT smoke tobacco or vape for 24 hours before your procedure.  Please read over the following fact sheets that you were given. Anesthesia Post-op Instructions and Care and Recovery After Surgery      Upper Endoscopy, Adult, Care After This sheet gives you information about how to care for yourself after your procedure. Your health care provider may also give you more specific instructions. If you have problems or questions, contact your health care provider. What can I expect after the procedure? After the procedure, it is common to have: A sore throat. Mild stomach pain or discomfort. Bloating. Nausea. Follow these instructions at home:  Follow instructions from your health care provider about what to eat or drink after your procedure. Return to your normal activities as told by your health care  provider. Ask your health care provider what activities are safe for you. Take over-the-counter and prescription medicines only as told by your health care provider. If you were given a sedative during the procedure, it can affect you for several hours. Do not drive or operate machinery until your health care provider says that it is safe. Keep all follow-up visits as told by your health care provider. This is important. Contact a health care provider if you have: A sore throat that lasts longer than one day. Trouble swallowing. Get help right away if: You vomit blood or your vomit looks like coffee grounds. You have: A fever. Bloody, black, or tarry stools. A severe sore throat or you cannot swallow. Difficulty breathing. Severe pain in your chest or abdomen. Summary After the procedure, it is common to have a sore throat, mild stomach discomfort, bloating, and nausea. If you were given a sedative during the procedure, it can affect you for several hours. Do not drive or operate machinery until your health care provider says that it is safe. Follow instructions from your health care provider about what to eat or drink after your procedure. Return to your normal activities as told by your health care provider. This information is not intended to replace advice given to you by your health  care provider. Make sure you discuss any questions you have with your health care provider. Document Revised: 10/05/2019 Document Reviewed: 05/01/2018 Elsevier Patient Education  [place] After This sheet gives you information about how to care for yourself after your procedure. Your health care provider may also give you more specific instructions. If you have problems or questions, contact your health care provider. What can I expect after the procedure? After the procedure, it is common to have: Tiredness. Forgetfulness about what happened after the  procedure. Impaired judgment for important decisions. Nausea or vomiting. Some difficulty with balance. Follow these instructions at home: For the time period you were told by your health care provider:     Rest as needed. Do not participate in activities where you could fall or become injured. Do not drive or use machinery. Do not drink alcohol. Do not take sleeping pills or medicines that cause drowsiness. Do not make important decisions or sign legal documents. Do not take care of children on your own. Eating and drinking Follow the diet that is recommended by your health care provider. Drink enough fluid to keep your urine pale yellow. If you vomit: Drink water, juice, or soup when you can drink without vomiting. Make sure you have little or no nausea before eating solid foods. General instructions Have a responsible adult stay with you for the time you are told. It is important to have someone help care for you until you are awake and alert. Take over-the-counter and prescription medicines only as told by your health care provider. If you have sleep apnea, surgery and certain medicines can increase your risk for breathing problems. Follow instructions from your health care provider about wearing your sleep device: Anytime you are sleeping, including during daytime naps. While taking prescription pain medicines, sleeping medicines, or medicines that make you drowsy. Avoid smoking. Keep all follow-up visits as told by your health care provider. This is important. Contact a health care provider if: You keep feeling nauseous or you keep vomiting. You feel light-headed. You are still sleepy or having trouble with balance after 24 hours. You develop a rash. You have a fever. You have redness or swelling around the IV site. Get help right away if: You have trouble breathing. You have new-onset confusion at home. Summary For several hours after your procedure, you may feel  tired. You may also be forgetful and have poor judgment. Have a responsible adult stay with you for the time you are told. It is important to have someone help care for you until you are awake and alert. Rest as told. Do not drive or operate machinery. Do not drink alcohol or take sleeping pills. Get help right away if you have trouble breathing, or if you suddenly become confused. This information is not intended to replace advice given to you by your health care provider. Make sure you discuss any questions you have with your health care provider. Document Revised: 11/03/2021 Document Reviewed: 11/01/2019 Elsevier Patient Education  [place].

## 2022-06-24 ENCOUNTER — Encounter (HOSPITAL_COMMUNITY)
Admission: RE | Admit: 2022-06-24 | Discharge: 2022-06-24 | Disposition: A | Discharge status: Home / Self Care | Payer: PPO | Source: Ambulatory Visit | Attending: Internal Medicine | Admitting: Internal Medicine

## 2022-06-24 ENCOUNTER — Encounter (HOSPITAL_COMMUNITY): Payer: Self-pay

## 2022-06-28 ENCOUNTER — Ambulatory Visit (HOSPITAL_COMMUNITY): Payer: PPO | Admitting: Anesthesiology

## 2022-06-28 ENCOUNTER — Ambulatory Visit (HOSPITAL_BASED_OUTPATIENT_CLINIC_OR_DEPARTMENT_OTHER): Payer: PPO | Admitting: Anesthesiology

## 2022-06-28 ENCOUNTER — Encounter (HOSPITAL_COMMUNITY)
Admission: RE | Disposition: A | Discharge status: Home / Self Care | Payer: Self-pay | Source: Home / Self Care | Attending: Internal Medicine

## 2022-06-28 ENCOUNTER — Encounter (HOSPITAL_COMMUNITY): Payer: Self-pay

## 2022-06-28 ENCOUNTER — Ambulatory Visit (HOSPITAL_COMMUNITY)
Admission: RE | Admit: 2022-06-28 | Discharge: 2022-06-28 | Disposition: A | Discharge status: Home / Self Care | Payer: PPO | Attending: Internal Medicine | Admitting: Internal Medicine

## 2022-06-28 DIAGNOSIS — E039 Hypothyroidism, unspecified: Secondary | ICD-10-CM

## 2022-06-28 DIAGNOSIS — K449 Diaphragmatic hernia without obstruction or gangrene: Secondary | ICD-10-CM | POA: Diagnosis not present

## 2022-06-28 DIAGNOSIS — K279 Peptic ulcer, site unspecified, unspecified as acute or chronic, without hemorrhage or perforation: Secondary | ICD-10-CM

## 2022-06-28 DIAGNOSIS — Z856 Personal history of leukemia: Secondary | ICD-10-CM | POA: Diagnosis not present

## 2022-06-28 DIAGNOSIS — K269 Duodenal ulcer, unspecified as acute or chronic, without hemorrhage or perforation: Secondary | ICD-10-CM | POA: Diagnosis present

## 2022-06-28 DIAGNOSIS — K3189 Other diseases of stomach and duodenum: Secondary | ICD-10-CM

## 2022-06-28 DIAGNOSIS — K31A19 Gastric intestinal metaplasia without dysplasia, unspecified site: Secondary | ICD-10-CM | POA: Diagnosis not present

## 2022-06-28 DIAGNOSIS — K259 Gastric ulcer, unspecified as acute or chronic, without hemorrhage or perforation: Secondary | ICD-10-CM | POA: Insufficient documentation

## 2022-06-28 DIAGNOSIS — M1990 Unspecified osteoarthritis, unspecified site: Secondary | ICD-10-CM | POA: Insufficient documentation

## 2022-06-28 HISTORY — PX: ESOPHAGOGASTRODUODENOSCOPY (EGD) WITH PROPOFOL: SHX5813

## 2022-06-28 HISTORY — PX: BIOPSY: SHX5522

## 2022-06-28 SURGERY — ESOPHAGOGASTRODUODENOSCOPY (EGD) WITH PROPOFOL
Anesthesia: General

## 2022-06-28 MED ORDER — LACTATED RINGERS IV SOLN: INTRAVENOUS | Status: DC | PRN

## 2022-06-28 MED ORDER — PROPOFOL 10 MG/ML IV BOLUS
INTRAVENOUS | Status: DC | PRN
  Administered 2022-06-28: 30 mg via INTRAVENOUS
  Administered 2022-06-28: 70 mg via INTRAVENOUS
  Administered 2022-06-28 (×2): 20 mg via INTRAVENOUS

## 2022-06-28 MED ORDER — LACTATED RINGERS IV SOLN: INTRAVENOUS | Status: DC

## 2022-06-28 MED ORDER — LIDOCAINE HCL (CARDIAC) PF 100 MG/5ML IV SOSY
PREFILLED_SYRINGE | INTRAVENOUS | Status: DC | PRN
  Administered 2022-06-28: 50 mg via INTRAVENOUS

## 2022-06-28 MED ORDER — PANTOPRAZOLE SODIUM 40 MG PO TBEC: 40.0000 mg | DELAYED_RELEASE_TABLET | Freq: Every day | ORAL | 3 refills | Status: DC

## 2022-06-28 NOTE — H&P (Signed)
Primary Care Physician:  Baruch Gouty, FNP Primary Gastroenterologist:  Dr. Abbey Chatters  Pre-Procedure History & Physical: HPI:  Andrea Martinez is a 76 y.o. female is here for an EGD to be performed for large duodenal ulcer, evaluate healing.   Past Medical History:  Diagnosis Date   Anxiety    denies   Arthritis    Chronic myelogenous leukemia (CML), BCR-ABL1-positive ([place]) 11/20/2015   Gastric ulcer 2011   EGD, 5/11   History of peptic ulcer disease    Hypothyroidism    not on meds, followed by Dr. Elyse Hsu   Lipoma    left upper arm   Mixed hyperlipidemia    Osteoporosis    Pericardial effusion    a. HCAP complicated by pericardial effusion requiring pericardial window 10/2016 and large L pleural effusion requring VATS.   Pleural effusion    a. s/p VATS 2017.   Pneumonia 10/2016   PONV (postoperative nausea and vomiting)    history of   Prolonged QT interval    Urine incontinence    UTI (lower urinary tract infection)    frequent   Vitamin D deficiency     Past Surgical History:  Procedure Laterality Date   BIOPSY  03/20/2022   Procedure: BIOPSY;  Surgeon: Eloise Harman, DO;  [facility];  Service: Endoscopy;;  gastric lesion vs. gastric ulcer   BRAVO [place] STUDY  12/04/2012   Procedure: BRAVO [place];  Surgeon: [name], MD;  [facility];  Service: Endoscopy;  Laterality: N/A;   CHOLECYSTECTOMY N/A 01/03/2014   Procedure: LAPAROSCOPIC CHOLECYSTECTOMY WITH INTRAOPERATIVE CHOLANGIOGRAM;  Surgeon: Joyice Faster. Cornett, MD;  Location: [place];  Service: General;  Laterality: N/A;   COLONOSCOPY  01/2004   DR [facility], few small tics   ESOPHAGOGASTRODUODENOSCOPY  02/2010   gastric ulcers   ESOPHAGOGASTRODUODENOSCOPY  12/04/2012   FWY:[id number] gastritis (inflammation) was found in the gastric antrum; multiple biopsies The duodenal mucosa showed no abnormalities in the bulb and second portion of the duodenum   ESOPHAGOGASTRODUODENOSCOPY  (EGD) WITH PROPOFOL N/A 03/20/2022   Procedure: ESOPHAGOGASTRODUODENOSCOPY (EGD) WITH PROPOFOL;  Surgeon: Eloise Harman, DO;  [facility];  Service: Endoscopy;  Laterality: N/A;   JOINT REPLACEMENT Right 10/22/2019   KYPHOPLASTY N/A 02/14/2019   Procedure: KYPHOPLASTY T12 and L1;  Surgeon: Melina Schools, MD;  Location: [place];  Service: Orthopedics;  Laterality: N/A;  120 mins   LIPOMA EXCISION  08/02/2011   left shoulder   NOSE SURGERY     PARTIAL HYSTERECTOMY     vaginal at age 75 years of age   TOE 42 Left 1962   lt great toe   TOTAL HIP ARTHROPLASTY Right 11/18/2021   Procedure: TOTAL HIP ARTHROPLASTY ANTERIOR APPROACH;  Surgeon: Gaynelle Arabian, MD;  [facility];  Service: Orthopedics;  Laterality: Right;   TOTAL KNEE ARTHROPLASTY Right 10/22/2019   Procedure: TOTAL KNEE ARTHROPLASTY;  Surgeon: Gaynelle Arabian, MD;  Location: [facility];  Service: Orthopedics;  Laterality: Right;  6mn   TOTAL KNEE ARTHROPLASTY Left 03/03/2020   Procedure: TOTAL KNEE ARTHROPLASTY;  Surgeon: AGaynelle Arabian MD;  [facility];  Service: Orthopedics;  Laterality: Left;  558m   TRANSFORAMINAL LUMBAR INTERBODY FUSION (TLIF) WITH PEDICLE SCREW FIXATION 1 LEVEL N/A 01/28/2021   Procedure: TRANSFORAMINAL LUMBAR INTERBODY FUSION (TLIF) LUMBAR FIVE-SACRAL ONE;  Surgeon: BrMelina SchoolsMD;  Location: [facility] Service: Orthopedics;  Laterality: N/A;  4 hrs   VIDEO ASSISTED  THORACOSCOPY (VATS)/EMPYEMA Left 10/25/2016   Procedure: VIDEO ASSISTED THORACOSCOPY (VATS), BRONCH,DRAINAGE OF PLEURAL EFFUSION,PERICARDIAL WINDOW WITH DRAINAGE OF PERICARDIAL FLUID, TEE;  Surgeon: [name], MD;  Location: [place];  Service: Thoracic;  Laterality: Left;   VIDEO BRONCHOSCOPY N/A 10/25/2016   Procedure: VIDEO BRONCHOSCOPY;  Surgeon: [name], MD;  Location: [place];  Service: Thoracic;  Laterality: N/A;    Prior to Admission medications   Medication Sig Start Date End Date Taking?  Authorizing Provider  acetaminophen (TYLENOL) 500 MG tablet Take 1,000 mg by mouth every 8 (eight) hours as needed for moderate pain.   Yes [provider]  cetirizine (ZYRTEC) 10 MG tablet Take 10 mg by mouth daily.   Yes [provider]  Cholecalciferol (VITAMIN D3) 50 MCG (2000 UT) TABS Take 2,000 Units by mouth daily in the afternoon.   Yes [provider]  citalopram (CELEXA) 20 MG tablet Take 20 mg by mouth daily.   Yes [provider]  lactose free nutrition (BOOST) LIQD Take 237 mLs by mouth daily.   Yes [provider]  levothyroxine (SYNTHROID) 112 MCG tablet Take 112 mcg by mouth daily before breakfast.   Yes [provider]  Multiple Vitamins-Minerals (CENTRUM SILVER PO) Take 1 tablet by mouth daily in the afternoon.   Yes [provider]  nilotinib (TASIGNA) 150 MG capsule Take 2 capsules (300 mg total) by mouth every 12 (twelve) hours. 11/16/21  Yes Derek Jack, MD  pantoprazole (PROTONIX) 40 MG tablet Take 1 tablet (40 mg total) by mouth daily. 30 minutes before breakfast Patient taking differently: Take 40 mg by mouth 2 (two) times daily. 30 minutes before breakfast 06/23/22  Yes Annitta Needs, NP  Probiotic Product (PROBIOTIC DAILY PO) Take 2 each by mouth daily in the afternoon.   Yes [provider]  sucralfate (CARAFATE) 1 g tablet TAKE 1 TABLET (1 G TOTAL) BY MOUTH 4 TIMES A DAY WITH MEALS AND AT BEDTIME 06/16/22  Yes Cristol Engdahl, Elon Alas, DO  vitamin B-12 (CYANOCOBALAMIN) 1000 MCG tablet Take 1,000 mcg by mouth daily.   Yes [provider]  levothyroxine (SYNTHROID) 100 MCG tablet Take 1 tablet (100 mcg total) by mouth daily before breakfast. Patient not taking: Reported on 06/23/2022 04/29/22   Loman Brooklyn, FNP    Allergies as of 06/23/2022 - Review Complete 06/23/2022  Allergen Reaction Noted   Nucynta [tapentadol hcl] Swelling 12/31/2020   Codeine Nausea Only and Other (See Comments)  02/11/2010   Sulfonamide derivatives Nausea Only and Other (See Comments) 02/11/2010   Nexium [esomeprazole magnesium] Nausea Only 06/23/2022   Sulfa antibiotics Nausea Only 06/22/2022   Fluconazole Rash 12/10/2015    Family History  Problem Relation Age of Onset   Lung cancer Father    COPD Mother    Stroke Mother    Thyroid disease Mother    Colon cancer Neg Hx    Anesthesia problems Neg Hx    Hypotension Neg Hx    Malignant hyperthermia Neg Hx    Pseudochol deficiency Neg Hx     Social History   Socioeconomic History   Marital status: Married    Spouse name: [name]   Number of children: 2   Years of education: 12th grade   Highest education level: Not on file  Occupational History   Occupation: retired    Fish farm manager: UENPLOYED    Employer: RETIRED  Tobacco Use   Smoking status: Never   Smokeless tobacco: Never  Vaping Use  Vaping Use: Never used  Substance and Sexual Activity   Alcohol use: No    Alcohol/week: 0.0 standard drinks of alcohol   Drug use: No   Sexual activity: Yes    Partners: Male    Birth control/protection: None  Other Topics Concern   Not on file  Social History Narrative   1 son, 1 daughter.   2 granddaughters.   Retired [employer].    Social Determinants of Health   Financial Resource Strain: Low Risk  (05/17/2022)   Overall Financial Resource Strain (CARDIA)    Difficulty of Paying Living Expenses: Not hard at all  Food Insecurity: No Food Insecurity (05/17/2022)   Hunger Vital Sign    Worried About Running Out of Food in the Last Year: Never true    Ran Out of Food in the Last Year: Never true  Transportation Needs: No Transportation Needs (05/17/2022)   PRAPARE - Hydrologist (Medical): No    Lack of Transportation (Non-Medical): No  Physical Activity: Sufficiently Active (05/17/2022)   Exercise Vital Sign    Days of Exercise per Week: 5 days    Minutes of Exercise per Session: 30 min  Stress: No Stress Concern  Present (05/17/2022)   [place]    Feeling of Stress : Not at all  Social Connections: [place] (05/17/2022)   Social Connection and Isolation Panel [NHANES]    Frequency of Communication with Friends and Family: More than three times a week    Frequency of Social Gatherings with Friends and Family: More than three times a week    Attends Religious Services: 1 to 4 times per year    Active Member of [employer] or Organizations: Yes    Attends Archivist Meetings: Never    Marital Status: Married  Human resources officer Violence: Not At Risk (05/17/2022)   Humiliation, Afraid, Rape, and Kick questionnaire    Fear of Current or Ex-Partner: No    Emotionally Abused: No    Physically Abused: No    Sexually Abused: No    Review of Systems: General: Negative for fever, chills, fatigue, weakness. Eyes: Negative for vision changes.  ENT: Negative for hoarseness, difficulty swallowing , nasal congestion. CV: Negative for chest pain, angina, palpitations, dyspnea on exertion, peripheral edema.  Respiratory: Negative for dyspnea at rest, dyspnea on exertion, cough, sputum, wheezing.  GI: See history of present illness. GU:  Negative for dysuria, hematuria, urinary incontinence, urinary frequency, nocturnal urination.  MS: Negative for joint pain, low back pain.  Derm: Negative for rash or itching.  Neuro: Negative for weakness, abnormal sensation, seizure, frequent headaches, memory loss, confusion.  Psych: Negative for anxiety, depression Endo: Negative for unusual weight change.  Heme: Negative for bruising or bleeding. Allergy: Negative for rash or hives.  Physical Exam: Vital signs in last 24 hours: Temp:  [98.8 F (37.1 C)] 98.8 F (37.1 C) (07/17 0647) Pulse Rate:  [82] 82 (07/17 0647) Resp:  [12] 12 (07/17 0647) BP: (151)/(76) 151/76 (07/17 0647) SpO2:  [100 %] 100 % (07/17 0647) Weight:  [91.1 kg] 91.1 kg  (07/17 0647)   General:   Alert,  Well-developed, well-nourished, pleasant and cooperative in NAD Head:  Normocephalic and atraumatic. Eyes:  Sclera clear, no icterus.   Conjunctiva pink. Ears:  Normal auditory acuity. Nose:  No deformity, discharge,  or lesions. Mouth:  No deformity or lesions, dentition normal. Neck:  Supple; no masses or thyromegaly.  Lungs:  Clear throughout to auscultation.   No wheezes, crackles, or rhonchi. No acute distress. Heart:  Regular rate and rhythm; no murmurs, clicks, rubs,  or gallops. Abdomen:  Soft, nontender and nondistended. No masses, hepatosplenomegaly or hernias noted. Normal bowel sounds, without guarding, and without rebound.   Msk:  Symmetrical without gross deformities. Normal posture. Extremities:  Without clubbing or edema. Neurologic:  Alert and  oriented x4;  grossly normal neurologically. Skin:  Intact without significant lesions or rashes. Cervical Nodes:  No significant cervical adenopathy. Psych:  Alert and cooperative. Normal mood and affect.   Impression/Plan: Andrea Martinez is here for an EGD to be performed for large duodenal ulcer, evaluate healing.   Risks, benefits, limitations, imponderables and alternatives regarding EGD have been reviewed with the patient. Questions have been answered. All parties agreeable.

## 2022-06-28 NOTE — Discharge Instructions (Addendum)
EGD Discharge instructions Please read the instructions outlined below and refer to this sheet in the next few weeks. These discharge instructions provide you with general information on caring for yourself after you leave the hospital. Your doctor may also give you specific instructions. While your treatment has been planned according to the most current medical practices available, unavoidable complications occasionally occur. If you have any problems or questions after discharge, please call your doctor. ACTIVITY You may resume your regular activity but move at a slower pace for the next 24 hours.  Take frequent rest periods for the next 24 hours.  Walking will help expel (get rid of) the air and reduce the bloated feeling in your abdomen.  No driving for 24 hours (because of the anesthesia (medicine) used during the test).  You may shower.  Do not sign any important legal documents or operate any machinery for 24 hours (because of the anesthesia used during the test).  NUTRITION Drink plenty of fluids.  You may resume your normal diet.  Begin with a light meal and progress to your normal diet.  Avoid alcoholic beverages for 24 hours or as instructed by your caregiver.  MEDICATIONS You may resume your normal medications unless your caregiver tells you otherwise.  WHAT YOU CAN EXPECT TODAY You may experience abdominal discomfort such as a feeling of fullness or "gas" pains.  FOLLOW-UP Your doctor will discuss the results of your test with you.  SEEK IMMEDIATE MEDICAL ATTENTION IF ANY OF THE FOLLOWING OCCUR: Excessive nausea (feeling sick to your stomach) and/or vomiting.  Severe abdominal pain and distention (swelling).  Trouble swallowing.  Temperature over 101 F (37.8 C).  Rectal bleeding or vomiting of blood.   Previously noted ulcer has nearly completely healed.  You can decrease your pantoprazole to once daily.  I would stay on this medication regardless of symptoms for another  12 weeks.  Avoid NSAIDs.  I did take biopsies of the area.  We will call with these results.  Follow-up with GI in 6 months.   I hope you have a great rest of your week!  Elon Alas. Abbey Chatters, D.O. Gastroenterology and Hepatology [facility]

## 2022-06-28 NOTE — Op Note (Signed)
[facility] Patient Name: Andrea Martinez Procedure Date: 06/28/2022 8:15 AM MRN: [id number] Date of Birth: 01-10-1946 Attending MD: Elon Alas. Abbey Chatters DO CSN: [id number] Age: 76 Admit Type: Outpatient Procedure:                Upper GI endoscopy Indications:              Follow-up of peptic ulcer Providers:                Elon Alas. Abbey Chatters, DO, Crystal Page, Everardo Pacific, Aram Candela Referring MD:              Medicines:                See the Anesthesia note for documentation of the                            administered medications Complications:            No immediate complications. Estimated Blood Loss:     Estimated blood loss was minimal. Procedure:                Pre-Anesthesia Assessment:                           - The anesthesia plan was to use monitored                            anesthesia care (MAC).                           After obtaining informed consent, the endoscope was                            passed under direct vision. Throughout the                            procedure, the patient's blood pressure, pulse, and                            oxygen saturations were monitored continuously. The                            GIF-H190 ([id number]) scope was introduced through the                            mouth, and advanced to the second part of duodenum.                            The upper GI endoscopy was accomplished without                            difficulty. The patient tolerated the procedure                            well. Scope In: 8:25:36  AM Scope Out: 8:29:43 AM Total Procedure Duration: 0 hours 4 minutes 7 seconds  Findings:      A small hiatal hernia was present.      The Z-line was regular and was found 37 cm from the incisors.      A nearly healed ulcer was found at the pylorus. Much improved compared       to prior examination. Biopsies were taken with a cold forceps for       histology.      The duodenal  bulb, first portion of the duodenum and second portion of       the duodenum were normal. Impression:               - Small hiatal hernia.                           - Z-line regular, 37 cm from the incisors.                           - Scar in the pylorus. Biopsied.                           - Normal duodenal bulb, first portion of the                            duodenum and second portion of the duodenum. Moderate Sedation:      Per Anesthesia Care Recommendation:           - Patient has a contact number available for                            emergencies. The signs and symptoms of potential                            delayed complications were discussed with the                            patient. Return to normal activities tomorrow.                            Written discharge instructions were provided to the                            patient.                           - Resume previous diet.                           - Continue present medications.                           - Await pathology results.                           - Use Protonix (pantoprazole) 40 mg PO daily.                           -  No ibuprofen, naproxen, or other non-steroidal                            anti-inflammatory drugs.                           - Return to GI clinic in 6 months. Procedure Code(s):        --- Professional ---                           [phone], Esophagogastroduodenoscopy, flexible,                            transoral; with biopsy, single or multiple Diagnosis Code(s):        --- Professional ---                           K44.9, Diaphragmatic hernia without obstruction or                            gangrene                           K31.89, Other diseases of stomach and duodenum                           K27.9, Peptic ulcer, site unspecified, unspecified                            as acute or chronic, without hemorrhage or                            perforation CPT copyright 2019  American Medical Association. All rights reserved. The codes documented in this report are preliminary and upon coder review may  be revised to meet current compliance requirements. Elon Alas. Abbey Chatters, DO [place] Arty Lantzy, DO 06/28/2022 8:33:10 AM This report has been signed electronically. Number of Addenda: 0

## 2022-06-28 NOTE — Anesthesia Preprocedure Evaluation (Signed)
Anesthesia Evaluation  Patient identified by MRN, date of birth, ID band Patient awake    Reviewed: Allergy & Precautions, NPO status , Patient's Chart, lab work & pertinent test results  History of Anesthesia Complications (+) PONV and history of anesthetic complications  Airway Mallampati: II  TM Distance: >3 FB Neck ROM: Full    Dental  (+) Dental Advisory Given, Caps   Pulmonary pneumonia,  VATS   Pulmonary exam normal breath sounds clear to auscultation       Cardiovascular Exercise Tolerance: Good negative cardio ROS Normal cardiovascular exam Rhythm:Regular Rate:Normal     Neuro/Psych PSYCHIATRIC DISORDERS Anxiety Depression negative neurological ROS     GI/Hepatic Neg liver ROS, PUD, neg GERD  Medicated,  Endo/Other  Hypothyroidism   Renal/GU negative Renal ROS  negative genitourinary   Musculoskeletal  (+) Arthritis , Osteoarthritis,  BACK SX Thoracic compression fx   Abdominal   Peds negative pediatric ROS (+)  Hematology  (+) Blood dyscrasia (CML), anemia ,   Anesthesia Other Findings   Reproductive/Obstetrics negative OB ROS                            Anesthesia Physical  Anesthesia Plan  ASA: 3  Anesthesia Plan: General   Post-op Pain Management: Minimal or no pain anticipated   Induction: Intravenous  PONV Risk Score and Plan: Propofol infusion  Airway Management Planned: Nasal Cannula and Natural Airway  Additional Equipment:   Intra-op Plan:   Post-operative Plan:   Informed Consent: I have reviewed the patients History and Physical, chart, labs and discussed the procedure including the risks, benefits and alternatives for the proposed anesthesia with the patient or authorized representative who has indicated his/her understanding and acceptance.     Dental advisory given  Plan Discussed with: CRNA and Surgeon  Anesthesia Plan Comments:                                          Anesthesia Evaluation  Patient identified by MRN, date of birth, ID band Patient awake    Reviewed: Allergy & Precautions, NPO status , Patient's Chart, lab work & pertinent test results  History of Anesthesia Complications (+) PONV and history of anesthetic complications  Airway Mallampati: II  TM Distance: >3 FB Neck ROM: Full    Dental  (+) Dental Advisory Given, Caps   Pulmonary pneumonia,  VATS   Pulmonary exam normal breath sounds clear to auscultation       Cardiovascular Exercise Tolerance: Good Normal cardiovascular exam+ dysrhythmias (Prolonged QT)  Rhythm:Regular Rate:Normal     Neuro/Psych PSYCHIATRIC DISORDERS Anxiety Depression negative neurological ROS     GI/Hepatic Neg liver ROS, PUD, neg GERD  Medicated,  Endo/Other  Hypothyroidism   Renal/GU negative Renal ROS  negative genitourinary   Musculoskeletal  (+) Arthritis , Osteoarthritis,    Abdominal   Peds negative pediatric ROS (+)  Hematology  (+) Blood dyscrasia (leukemia - CML), anemia ,   Anesthesia Other Findings   Reproductive/Obstetrics negative OB ROS                           Anesthesia Physical Anesthesia Plan  ASA: 3  Anesthesia Plan: General   Post-op Pain Management: Minimal or no pain anticipated   Induction: Intravenous  PONV Risk Score and Plan:  Airway Management Planned: Nasal Cannula and Natural Airway  Additional Equipment:   Intra-op Plan:   Post-operative Plan: Possible Post-op intubation/ventilation  Informed Consent: I have reviewed the patients History and Physical, chart, labs and discussed the procedure including the risks, benefits and alternatives for the proposed anesthesia with the patient or authorized representative who has indicated his/her understanding and acceptance.     Dental advisory given  Plan Discussed with: Surgeon  Anesthesia Plan Comments:         Anesthesia Quick Evaluation  Anesthesia Quick Evaluation

## 2022-06-28 NOTE — Transfer of Care (Signed)
Immediate Anesthesia Transfer of Care Note  Patient: [name]  Procedure(s) Performed: ESOPHAGOGASTRODUODENOSCOPY (EGD) WITH PROPOFOL BIOPSY  Patient Location: Short Stay  Anesthesia Type:General  Level of Consciousness: drowsy  Airway & Oxygen Therapy: Patient Spontanous Breathing  Post-op Assessment: Report given to RN and Post -op Vital signs reviewed and stable  Post vital signs: Reviewed and stable  Last Vitals:  Vitals Value Taken Time  BP 103/42 06/28/22 0833  Temp 36.4 C 06/28/22 0833  Pulse 65 06/28/22 0833  Resp 19 06/28/22 0833  SpO2 93 % 06/28/22 0833    Last Pain:  Vitals:   06/28/22 0833  TempSrc: Axillary  PainSc:       Patients Stated Pain Goal: 8 (90/37/95 5831)  Complications: No notable events documented.

## 2022-06-28 NOTE — Anesthesia Postprocedure Evaluation (Signed)
Anesthesia Post Note  Patient: [name]  Procedure(s) Performed: ESOPHAGOGASTRODUODENOSCOPY (EGD) WITH PROPOFOL BIOPSY  Patient location during evaluation: Phase II Anesthesia Type: General Level of consciousness: awake and alert and oriented Pain management: pain level controlled Vital Signs Assessment: post-procedure vital signs reviewed and stable Respiratory status: spontaneous breathing, nonlabored ventilation and respiratory function stable Cardiovascular status: blood pressure returned to baseline and stable Postop Assessment: no apparent nausea or vomiting Anesthetic complications: no   No notable events documented.   Last Vitals:  Vitals:   06/28/22 0647 06/28/22 0833  BP: (!) 151/76 (!) 103/42  Pulse: 82 65  Resp: 12 19  Temp: 37.1 C (!) 36.4 C  SpO2: 100% 93%    Last Pain:  Vitals:   06/28/22 0833  TempSrc: Axillary  PainSc:                  Andrea Martinez

## 2022-06-29 ENCOUNTER — Other Ambulatory Visit (HOSPITAL_COMMUNITY): Payer: Self-pay

## 2022-06-29 DIAGNOSIS — C9210 Chronic myeloid leukemia, BCR/ABL-positive, not having achieved remission: Secondary | ICD-10-CM

## 2022-06-29 MED ORDER — NILOTINIB HCL 150 MG PO CAPS: 300.0000 mg | ORAL_CAPSULE | Freq: Two times a day (BID) | ORAL | 12 refills | Status: DC

## 2022-06-29 NOTE — Telephone Encounter (Signed)
[facility] refilled per Dr. Delton Coombes

## 2022-06-30 ENCOUNTER — Telehealth: Payer: Self-pay

## 2022-06-30 LAB — SURGICAL PATHOLOGY

## 2022-06-30 NOTE — Telephone Encounter (Signed)
Pt called and wants to know how long will she have to take pantoprazole and carafate. She is under the impression that after a month or 100% healing she can come off it. Advised you told her she is 85% healed. Pt is wanting to also know about the carafate. Please advise

## 2022-07-01 ENCOUNTER — Encounter (HOSPITAL_COMMUNITY): Payer: Self-pay | Admitting: Internal Medicine

## 2022-07-01 NOTE — Telephone Encounter (Signed)
I would continue pantoprazole 40 mg daily for 8 more weeks at which point she can try to wean off.  She does not need to take the Carafate unless she is having symptoms.

## 2022-07-02 NOTE — Telephone Encounter (Signed)
Phoned and advised the pt of her result note and recommendations. Pt expressed understanding of this

## 2022-07-05 ENCOUNTER — Other Ambulatory Visit (HOSPITAL_COMMUNITY): Payer: PPO

## 2022-07-12 ENCOUNTER — Ambulatory Visit (HOSPITAL_COMMUNITY): Payer: PPO | Admitting: Hematology

## 2022-07-13 ENCOUNTER — Inpatient Hospital Stay: Payer: PPO | Attending: Hematology

## 2022-07-13 DIAGNOSIS — R61 Generalized hyperhidrosis: Secondary | ICD-10-CM | POA: Insufficient documentation

## 2022-07-13 DIAGNOSIS — D649 Anemia, unspecified: Secondary | ICD-10-CM | POA: Diagnosis not present

## 2022-07-13 DIAGNOSIS — C9210 Chronic myeloid leukemia, BCR/ABL-positive, not having achieved remission: Secondary | ICD-10-CM | POA: Insufficient documentation

## 2022-07-13 DIAGNOSIS — Z801 Family history of malignant neoplasm of trachea, bronchus and lung: Secondary | ICD-10-CM | POA: Insufficient documentation

## 2022-07-13 LAB — CBC WITH DIFFERENTIAL/PLATELET
Abs Immature Granulocytes: 0.03 10*3/uL (ref 0.00–0.07)
Basophils Absolute: 0 10*3/uL (ref 0.0–0.1)
Basophils Relative: 0 %
Eosinophils Absolute: 0.2 10*3/uL (ref 0.0–0.5)
Eosinophils Relative: 2 %
HCT: 36.6 % (ref 36.0–46.0)
Hemoglobin: 11.2 g/dL — ABNORMAL LOW (ref 12.0–15.0)
Immature Granulocytes: 0 %
Lymphocytes Relative: 12 %
Lymphs Abs: 1.2 10*3/uL (ref 0.7–4.0)
MCH: 26.4 pg (ref 26.0–34.0)
MCHC: 30.6 g/dL (ref 30.0–36.0)
MCV: 86.1 fL (ref 80.0–100.0)
Monocytes Absolute: 0.6 10*3/uL (ref 0.1–1.0)
Monocytes Relative: 6 %
Neutro Abs: 8.2 10*3/uL — ABNORMAL HIGH (ref 1.7–7.7)
Neutrophils Relative %: 80 %
Platelets: 297 10*3/uL (ref 150–400)
RBC: 4.25 MIL/uL (ref 3.87–5.11)
RDW: 13.7 % (ref 11.5–15.5)
WBC: 10.2 10*3/uL (ref 4.0–10.5)
nRBC: 0 % (ref 0.0–0.2)

## 2022-07-13 LAB — COMPREHENSIVE METABOLIC PANEL
ALT: 17 U/L (ref 0–44)
AST: 26 U/L (ref 15–41)
Albumin: 3.9 g/dL (ref 3.5–5.0)
Alkaline Phosphatase: 156 U/L — ABNORMAL HIGH (ref 38–126)
Anion gap: 6 (ref 5–15)
BUN: 20 mg/dL (ref 8–23)
CO2: 25 mmol/L (ref 22–32)
Calcium: 9 mg/dL (ref 8.9–10.3)
Chloride: 108 mmol/L (ref 98–111)
Creatinine, Ser: 0.92 mg/dL (ref 0.44–1.00)
GFR, Estimated: >60 mL/min (ref >60)
Glucose, Bld: 105 mg/dL — ABNORMAL HIGH (ref 70–99)
Potassium: 4.6 mmol/L (ref 3.5–5.1)
Sodium: 139 mmol/L (ref 135–145)
Total Bilirubin: 1 mg/dL (ref 0.3–1.2)
Total Protein: 7.6 g/dL (ref 6.5–8.1)

## 2022-07-13 LAB — IRON AND TIBC
Iron: 32 ug/dL (ref 28–170)
Saturation Ratios: 9 % — ABNORMAL LOW (ref 10.4–31.8)
TIBC: 361 ug/dL (ref 250–450)
UIBC: 329 ug/dL

## 2022-07-13 LAB — FERRITIN: Ferritin: 38 ng/mL (ref 11–307)

## 2022-07-16 ENCOUNTER — Other Ambulatory Visit: Payer: Self-pay | Admitting: Internal Medicine

## 2022-07-18 LAB — BCR-ABL1, CML/ALL, PCR, QUANT: Interpretation (BCRAL):: NEGATIVE

## 2022-07-19 NOTE — Telephone Encounter (Signed)
Can we find out if Carafate refill is needed?  Looks like per Anna's last recommendation, patient did not need to continue Carafate unless she was having symptoms.

## 2022-07-20 ENCOUNTER — Inpatient Hospital Stay (HOSPITAL_BASED_OUTPATIENT_CLINIC_OR_DEPARTMENT_OTHER): Payer: PPO | Admitting: Hematology

## 2022-07-20 VITALS — BP 105/72 | HR 86 | Temp 97.0°F | Resp 19 | Ht 65.0 in | Wt 203.1 lb

## 2022-07-20 DIAGNOSIS — C9210 Chronic myeloid leukemia, BCR/ABL-positive, not having achieved remission: Secondary | ICD-10-CM

## 2022-07-20 NOTE — Progress Notes (Signed)
[place] [address], [place] [address]   CLINIC:  Medical Oncology/Hematology  PCP:  Baruch Gouty, [address] / [place] [address]  [phone]  REASON FOR VISIT:  Follow-up for CML  PRIOR THERAPY: none  CURRENT THERAPY: Tasigna 300 mg Q 12 H  INTERVAL HISTORY:  [name], a 76 y.o. female, returns for follow-up of her CML.  She is taking to [facility] twice daily without missing any doses.  She was reportedly hospitalized in March of this year for a perforation of the peptic ulcer.  She reports sweating in her scalp which she had over 5 years, 1-2 times per day which is stable.  Denies any fevers or night sweats.  REVIEW OF SYSTEMS:  Review of Systems  Constitutional:  Negative for appetite change and fatigue.  Gastrointestinal:  Negative for constipation, diarrhea and nausea.  Musculoskeletal:  Negative for arthralgias.  Psychiatric/Behavioral:  Positive for depression. Negative for sleep disturbance. The patient is not nervous/anxious.   All other systems reviewed and are negative.   PAST MEDICAL/SURGICAL HISTORY:  Past Medical History:  Diagnosis Date   Anxiety    denies   Arthritis    Chronic myelogenous leukemia (CML), BCR-ABL1-positive ([place]) 11/20/2015   Gastric ulcer 2011   EGD, 5/11   History of peptic ulcer disease    Hypothyroidism    not on meds, followed by Dr. Elyse Hsu   Lipoma    left upper arm   Mixed hyperlipidemia    Osteoporosis    Pericardial effusion    a. HCAP complicated by pericardial effusion requiring pericardial window 10/2016 and large L pleural effusion requring VATS.   Pleural effusion    a. s/p VATS 2017.   Pneumonia 10/2016   PONV (postoperative nausea and vomiting)    history of   Prolonged QT interval    Urine incontinence    UTI (lower urinary tract infection)    frequent   Vitamin D deficiency    Past Surgical History:  Procedure Laterality Date   BIOPSY  03/20/2022    Procedure: BIOPSY;  Surgeon: Eloise Harman, DO;  [facility];  Service: Endoscopy;;  gastric lesion vs. gastric ulcer   BIOPSY  06/28/2022   Procedure: BIOPSY;  Surgeon: Eloise Harman, DO;  [facility];  Service: Endoscopy;;   BRAVO [place] STUDY  12/04/2012   Procedure: BRAVO [place];  Surgeon: [name], MD;  [facility];  Service: Endoscopy;  Laterality: N/A;   CHOLECYSTECTOMY N/A 01/03/2014   Procedure: LAPAROSCOPIC CHOLECYSTECTOMY WITH INTRAOPERATIVE CHOLANGIOGRAM;  Surgeon: Joyice Faster. Cornett, MD;  Location: [facility];  Service: General;  Laterality: N/A;   COLONOSCOPY  01/2004   DR [facility], few small tics   ESOPHAGOGASTRODUODENOSCOPY  02/2010   gastric ulcers   ESOPHAGOGASTRODUODENOSCOPY  12/04/2012   YKD:[id number] gastritis (inflammation) was found in the gastric antrum; multiple biopsies The duodenal mucosa showed no abnormalities in the bulb and second portion of the duodenum   ESOPHAGOGASTRODUODENOSCOPY (EGD) WITH PROPOFOL N/A 03/20/2022   Procedure: ESOPHAGOGASTRODUODENOSCOPY (EGD) WITH PROPOFOL;  Surgeon: Eloise Harman, DO;  [facility];  Service: Endoscopy;  Laterality: N/A;   ESOPHAGOGASTRODUODENOSCOPY (EGD) WITH PROPOFOL N/A 06/28/2022   Procedure: ESOPHAGOGASTRODUODENOSCOPY (EGD) WITH PROPOFOL;  Surgeon: Eloise Harman, DO;  [facility];  Service: Endoscopy;  Laterality: N/A;  800   JOINT REPLACEMENT Right 10/22/2019   KYPHOPLASTY N/A 02/14/2019  Procedure: KYPHOPLASTY T12 and L1;  Surgeon: Melina Schools, MD;  Location: [place];  Service: Orthopedics;  Laterality: N/A;  120 mins   LIPOMA EXCISION  08/02/2011   left shoulder   NOSE SURGERY     PARTIAL HYSTERECTOMY     vaginal at age 82 years of age   TOE 55 Left 1962   lt great toe   TOTAL HIP ARTHROPLASTY Right 11/18/2021   Procedure: TOTAL HIP ARTHROPLASTY ANTERIOR APPROACH;  Surgeon: Gaynelle Arabian, MD;  [facility];  Service:  Orthopedics;  Laterality: Right;   TOTAL KNEE ARTHROPLASTY Right 10/22/2019   Procedure: TOTAL KNEE ARTHROPLASTY;  Surgeon: Gaynelle Arabian, MD;  [facility];  Service: Orthopedics;  Laterality: Right;  42mn   TOTAL KNEE ARTHROPLASTY Left 03/03/2020   Procedure: TOTAL KNEE ARTHROPLASTY;  Surgeon: AGaynelle Arabian MD;  [facility];  Service: Orthopedics;  Laterality: Left;  569m   TRANSFORAMINAL LUMBAR INTERBODY FUSION (TLIF) WITH PEDICLE SCREW FIXATION 1 LEVEL N/A 01/28/2021   Procedure: TRANSFORAMINAL LUMBAR INTERBODY FUSION (TLIF) LUMBAR FIVE-SACRAL ONE;  Surgeon: BrMelina SchoolsMD;  [place] Service: Orthopedics;  Laterality: N/A;  4 hrs   VIDEO ASSISTED THORACOSCOPY (VATS)/EMPYEMA Left 10/25/2016   Procedure: VIDEO ASSISTED THORACOSCOPY (VATS), BRONCH,DRAINAGE OF PLEURAL EFFUSION,PERICARDIAL WINDOW WITH DRAINAGE OF PERICARDIAL FLUID, TEE;  Surgeon: StMelrose NakayamaMD;  [place] [facility] Service: Thoracic;  Laterality: Left;   VIDEO BRONCHOSCOPY N/A 10/25/2016   Procedure: VIDEO BRONCHOSCOPY;  Surgeon: StMelrose NakayamaMD;  [facility] Service: Thoracic;  Laterality: N/A;    SOCIAL HISTORY:  Social History   Socioeconomic History   Marital status: Married    Spouse name: GeIona Beard Number of children: 2   Years of education: 12th grade   Highest education level: Not on file  Occupational History   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UENPLOYED    Employer: RETIRED  Tobacco Use   Smoking status: Never   Smokeless tobacco: Never  Vaping Use   Vaping Use: Never used  Substance and Sexual Activity   Alcohol use: No    Alcohol/week: 0.0 standard drinks of alcohol   Drug use: No   Sexual activity: Yes    Partners: Male    Birth control/protection: None  Other Topics Concern   Not on file  Social History Narrative   1 son, 1 daughter.   2 granddaughters.   Retired [employer]   Social Determinants of Health   Financial Resource Strain: Low Risk   (05/17/2022)   Overall Financial Resource Strain (CARDIA)    Difficulty of Paying Living Expenses: Not hard at all  Food Insecurity: No Food Insecurity (05/17/2022)   Hunger Vital Sign    Worried About Running Out of Food in the Last Year: Never true    Ran Out of Food in the Last Year: Never true  Transportation Needs: No Transportation Needs (05/17/2022)   PRAPARE - TrHydrologistMedical): No    Lack of Transportation (Non-Medical): No  Physical Activity: Sufficiently Active (05/17/2022)   Exercise Vital Sign    Days of Exercise per Week: 5 days    Minutes of Exercise per Session: 30 min  Stress: No Stress Concern Present (05/17/2022)   [place]  Feeling of Stress : Not at all  Social Connections: [place]6/04/2022)   Social Connection and Isolation Panel [NHANES]    Frequency  of Communication with Friends and Family: More than three times a week    Frequency of Social Gatherings with Friends and Family: More than three times a week    Attends Religious Services: 1 to 4 times per year    Active Member of [employer] or Organizations: Yes    Attends Archivist Meetings: Never    Marital Status: Married  Human resources officer Violence: Not At Risk (05/17/2022)   Humiliation, Afraid, Rape, and Kick questionnaire    Fear of Current or Ex-Partner: No    Emotionally Abused: No    Physically Abused: No    Sexually Abused: No    FAMILY HISTORY:  Family History  Problem Relation Age of Onset   Lung cancer Father    COPD Mother    Stroke Mother    Thyroid disease Mother    Colon cancer Neg Hx    Anesthesia problems Neg Hx    Hypotension Neg Hx    Malignant hyperthermia Neg Hx    Pseudochol deficiency Neg Hx     CURRENT MEDICATIONS:  Current Outpatient Medications  Medication Sig Dispense Refill   acetaminophen (TYLENOL) 500 MG tablet Take 1,000 mg by mouth every 8 (eight) hours  as needed for moderate pain.     cetirizine (ZYRTEC) 10 MG tablet Take 10 mg by mouth daily.     Cholecalciferol (VITAMIN D3) 50 MCG (2000 UT) TABS Take 2,000 Units by mouth daily in the afternoon.     citalopram (CELEXA) 20 MG tablet Take 20 mg by mouth daily.     lactose free nutrition (BOOST) LIQD Take 237 mLs by mouth daily.     levothyroxine (SYNTHROID) 112 MCG tablet Take 112 mcg by mouth daily before breakfast.     Multiple Vitamins-Minerals (CENTRUM SILVER PO) Take 1 tablet by mouth daily in the afternoon.     nilotinib (TASIGNA) 150 MG capsule Take 2 capsules (300 mg total) by mouth every 12 (twelve) hours. 120 capsule 12   pantoprazole (PROTONIX) 40 MG tablet Take 1 tablet (40 mg total) by mouth daily. 30 minutes before breakfast 90 tablet 3   Probiotic Product (PROBIOTIC DAILY PO) Take 2 each by mouth daily in the afternoon.     vitamin B-12 (CYANOCOBALAMIN) 1000 MCG tablet Take 1,000 mcg by mouth daily.     No current facility-administered medications for this visit.    ALLERGIES:  Allergies  Allergen Reactions   Nucynta [Tapentadol Hcl] Swelling   Codeine Nausea Only and Other (See Comments)    Headache   Sulfonamide Derivatives Nausea Only and Other (See Comments)    Headache   Nexium [Esomeprazole Magnesium] Nausea Only   Sulfa Antibiotics Nausea Only   Fluconazole Rash    Redness and blistering on left thigh    PHYSICAL EXAM:  Performance status (ECOG): 2 - Symptomatic, <50% confined to bed  Vitals:   07/20/22 1355  BP: 105/72  Pulse: 86  Resp: 19  Temp: (!) 97 F (36.1 C)  SpO2: 99%   Wt Readings from Last 3 Encounters:  07/20/22 203 lb 1.6 oz (92.1 kg)  06/28/22 200 lb 12.8 oz (91.1 kg)  06/23/22 200 lb 12.8 oz (91.1 kg)   Physical Exam Vitals reviewed.  Constitutional:      Appearance: Normal appearance.  Cardiovascular:     Rate and Rhythm: Normal rate and regular rhythm.     Pulses: Normal pulses.     Heart sounds: Normal heart sounds.   Pulmonary:  Effort: Pulmonary effort is normal.     Breath sounds: Normal breath sounds.  Musculoskeletal:     Right lower leg: No edema.     Left lower leg: No edema.  Neurological:     General: No focal deficit present.     Mental Status: She is alert and oriented to person, place, and time.  Psychiatric:        Mood and Affect: Mood normal.        Behavior: Behavior normal.     LABORATORY DATA:  I have reviewed the labs as listed.     Latest Ref Rng & Units 07/13/2022    9:56 AM 04/28/2022    2:30 PM 03/21/2022    4:10 AM  CBC  WBC 4.0 - 10.5 K/uL 10.2  10.8  11.6   Hemoglobin 12.0 - 15.0 g/dL 11.2  11.3  9.2   Hematocrit 36.0 - 46.0 % 36.6  35.3  29.9   Platelets 150 - 400 K/uL 297  367  217       Latest Ref Rng & Units 07/13/2022    9:56 AM 04/28/2022    2:30 PM 03/21/2022    4:10 AM  CMP  Glucose 70 - 99 mg/dL 105  94  107   BUN 8 - 23 mg/dL '20  24  22   ' Creatinine 0.44 - 1.00 mg/dL 0.92  1.00  0.96   Sodium 135 - 145 mmol/L 139  141  138   Potassium 3.5 - 5.1 mmol/L 4.6  5.1  4.2   Chloride 98 - 111 mmol/L 108  101  108   CO2 22 - 32 mmol/L '25  23  23   ' Calcium 8.9 - 10.3 mg/dL 9.0  9.6  8.2   Total Protein 6.5 - 8.1 g/dL 7.6  7.4  6.1   Total Bilirubin 0.3 - 1.2 mg/dL 1.0  0.4  0.4   Alkaline Phos 38 - 126 U/L 156  205  126   AST 15 - 41 U/L '26  31  24   ' ALT 0 - 44 U/L '17  20  26       ' Component Value Date/Time   RBC 4.25 07/13/2022 0956   MCV 86.1 07/13/2022 0956   MCV 84 04/28/2022 1430   MCH 26.4 07/13/2022 0956   MCHC 30.6 07/13/2022 0956   RDW 13.7 07/13/2022 0956   RDW 13.8 04/28/2022 1430   LYMPHSABS 1.2 07/13/2022 0956   LYMPHSABS 1.5 04/28/2022 1430   MONOABS 0.6 07/13/2022 0956   EOSABS 0.2 07/13/2022 0956   EOSABS 0.2 04/28/2022 1430   BASOSABS 0.0 07/13/2022 0956   BASOSABS 0.0 04/28/2022 1430    DIAGNOSTIC IMAGING:  I have independently reviewed the scans and discussed with the patient. No results found.   ASSESSMENT:  1.  CML in  chronic phase: -Nilotinib started in January 2017. -Left pleural effusion and pericardial effusion requiring pericardial window on 10/25/2016.  Fluid cytology negative. -Started back on Nilotinib 300 mg every 12 hours on 12/06/2016.   2.  Daytime sweats: -He had a long history of daytime sweats.  She had extensive work-up including a CT scan CAP, which showed no evidence of malignancy.  Hepatic morphology suspicious for cirrhosis.   PLAN:  1.  CML in chronic phase: - She is tolerating Nilotinib 300 mg twice daily very well. - Reviewed labs from 07/13/2022 which showed normal LFTs except elevated alk phos which is stable.  CBC was grossly normal. - BCR/ABL by  quantitative PCR was within normal limits. - Continue Nilotinib 300 mg twice daily. - Recommend follow-up in 6 months with repeat BCR/ABL by PCR.   2.  Normocytic anemia: - Mild anemia with hemoglobin 11.2.  Ferritin is 38 and percent saturation 9. - Recommend starting iron tablet once daily.  If cannot tolerate due to constipation, she may take it 3 times a week with stool softener.  We will check ferritin and iron panel at next visit.  Orders placed this encounter:  Orders Placed This Encounter  Procedures   BCR-ABL1, CML/ALL, PCR, QUANT   CBC with Differential/Platelet   Comprehensive metabolic panel   Lactate dehydrogenase   Ferritin   Iron and TIBC      Derek Jack, MD [place] [phone]

## 2022-07-20 NOTE — Patient Instructions (Addendum)
[place] at [facility] Discharge Instructions  You were seen and examined today by Dr. Delton Coombes.  Dr. Delton Coombes discussed your most recent lab work and all of your labs look good.  Start taking Iron 325 mg once daily. If you have constipation take stool softeners with it.   Follow-up as scheduled in 6 months.    Thank you for choosing [place] at [facility] to provide your oncology and hematology care.  To afford each patient quality time with our provider, please arrive at least 15 minutes before your scheduled appointment time.   If you have a lab appointment with the [facility] please come in thru the Main Entrance and check in at the main information desk.  You need to re-schedule your appointment should you arrive 10 or more minutes late.  We strive to give you quality time with our providers, and arriving late affects you and other patients whose appointments are after yours.  Also, if you no show three or more times for appointments you may be dismissed from the clinic at the providers discretion.     Again, thank you for choosing [facility].  Our hope is that these requests will decrease the amount of time that you wait before being seen by our physicians.       _____________________________________________________________  Should you have questions after your visit to [facility], please contact our office at [phone] and follow the prompts.  Our office hours are 8:00 a.m. and 4:30 p.m. Monday - Friday.  Please note that voicemails left after 4:00 p.m. may not be returned until the following business day.  We are closed weekends and major holidays.  You do have access to a nurse 24-7, just call the main number to the clinic [phone] and do not press any options, hold on the line and a nurse will answer the phone.    For prescription refill requests, have your pharmacy contact our office and  allow 72 hours.

## 2022-07-20 NOTE — Telephone Encounter (Signed)
Pt needs RF. Her last ov was 06/23/2022

## 2022-07-21 ENCOUNTER — Other Ambulatory Visit: Payer: Self-pay

## 2022-07-21 DIAGNOSIS — E039 Hypothyroidism, unspecified: Secondary | ICD-10-CM

## 2022-07-21 NOTE — Telephone Encounter (Signed)
Noted   Pt aware of Rx being sent to pharmacy

## 2022-08-03 ENCOUNTER — Other Ambulatory Visit: Payer: Self-pay | Admitting: Internal Medicine

## 2022-08-03 ENCOUNTER — Other Ambulatory Visit: Payer: Self-pay | Admitting: Family Medicine

## 2022-08-03 DIAGNOSIS — F419 Anxiety disorder, unspecified: Secondary | ICD-10-CM

## 2022-08-03 DIAGNOSIS — F320 Major depressive disorder, single episode, mild: Secondary | ICD-10-CM

## 2022-08-03 LAB — T4, FREE: Free T4: 1.72 ng/dL (ref 0.82–1.77)

## 2022-08-03 LAB — TSH: TSH: 0.014 u[IU]/mL — ABNORMAL LOW (ref 0.450–4.500)

## 2022-08-11 ENCOUNTER — Encounter: Payer: Self-pay | Admitting: "Endocrinology

## 2022-08-11 ENCOUNTER — Ambulatory Visit: Payer: PPO | Admitting: "Endocrinology

## 2022-08-11 VITALS — BP 142/80 | HR 80 | Ht 65.0 in | Wt 205.8 lb

## 2022-08-11 DIAGNOSIS — E782 Mixed hyperlipidemia: Secondary | ICD-10-CM

## 2022-08-11 DIAGNOSIS — E039 Hypothyroidism, unspecified: Secondary | ICD-10-CM | POA: Diagnosis not present

## 2022-08-11 MED ORDER — LEVOTHYROXINE SODIUM 100 MCG PO TABS: 100.0000 ug | ORAL_TABLET | Freq: Every day | ORAL | 1 refills | Status: DC

## 2022-08-11 NOTE — Progress Notes (Signed)
08/11/2022     Endocrinology follow-up note    Subjective:    Patient ID: Andrea Martinez, female    DOB: 1946-01-03, PCP Baruch Gouty, FNP   Past Medical History:  Diagnosis Date   Anxiety    denies   Arthritis    Chronic myelogenous leukemia (CML), BCR-ABL1-positive ([place]) 11/20/2015   Gastric ulcer 2011   EGD, 5/11   History of peptic ulcer disease    Hypothyroidism    not on meds, followed by Dr. Elyse Hsu   Lipoma    left upper arm   Mixed hyperlipidemia    Osteoporosis    Pericardial effusion    a. HCAP complicated by pericardial effusion requiring pericardial window 10/2016 and large L pleural effusion requring VATS.   Pleural effusion    a. s/p VATS 2017.   Pneumonia 10/2016   PONV (postoperative nausea and vomiting)    history of   Prolonged QT interval    Urine incontinence    UTI (lower urinary tract infection)    frequent   Vitamin D deficiency    Past Surgical History:  Procedure Laterality Date   BIOPSY  03/20/2022   Procedure: BIOPSY;  Surgeon: Eloise Harman, DO;  [facility];  Service: Endoscopy;;  gastric lesion vs. gastric ulcer   BIOPSY  06/28/2022   Procedure: BIOPSY;  Surgeon: Eloise Harman, DO;  [facility];  Service: Endoscopy;;   BRAVO [place] STUDY  12/04/2012   Procedure: BRAVO [place];  Surgeon: [name], MD;  [facility];  Service: Endoscopy;  Laterality: N/A;   CHOLECYSTECTOMY N/A 01/03/2014   Procedure: LAPAROSCOPIC CHOLECYSTECTOMY WITH INTRAOPERATIVE CHOLANGIOGRAM;  Surgeon: Joyice Faster. Cornett, MD;  Location: [place];  Service: General;  Laterality: N/A;   COLONOSCOPY  01/2004   DR [facility], few small tics   ESOPHAGOGASTRODUODENOSCOPY  02/2010   gastric ulcers   ESOPHAGOGASTRODUODENOSCOPY  12/04/2012   GGY:IRSWNIO gastritis (inflammation) was found in the gastric antrum; multiple biopsies The duodenal mucosa showed no abnormalities in the bulb and second portion of the duodenum    ESOPHAGOGASTRODUODENOSCOPY (EGD) WITH PROPOFOL N/A 03/20/2022   Procedure: ESOPHAGOGASTRODUODENOSCOPY (EGD) WITH PROPOFOL;  Surgeon: Eloise Harman, DO;  [facility];  Service: Endoscopy;  Laterality: N/A;   ESOPHAGOGASTRODUODENOSCOPY (EGD) WITH PROPOFOL N/A 06/28/2022   Procedure: ESOPHAGOGASTRODUODENOSCOPY (EGD) WITH PROPOFOL;  Surgeon: Eloise Harman, DO;  [facility];  Service: Endoscopy;  Laterality: N/A;  800   JOINT REPLACEMENT Right 10/22/2019   KYPHOPLASTY N/A 02/14/2019   Procedure: KYPHOPLASTY T12 and L1;  Surgeon: Melina Schools, MD;  Location: [place];  Service: Orthopedics;  Laterality: N/A;  120 mins   LIPOMA EXCISION  08/02/2011   left shoulder   NOSE SURGERY     PARTIAL HYSTERECTOMY     vaginal at age 33 years of age   TOE 3 Left 1962   lt great toe   TOTAL HIP ARTHROPLASTY Right 11/18/2021   Procedure: TOTAL HIP ARTHROPLASTY ANTERIOR APPROACH;  Surgeon: Gaynelle Arabian, MD;  [facility];  Service: Orthopedics;  Laterality: Right;   TOTAL KNEE ARTHROPLASTY Right 10/22/2019   Procedure: TOTAL KNEE ARTHROPLASTY;  Surgeon: Gaynelle Arabian, MD;  [facility];  Service: Orthopedics;  Laterality: Right;  78mn   TOTAL KNEE ARTHROPLASTY Left 03/03/2020   Procedure: TOTAL KNEE ARTHROPLASTY;  Surgeon: AGaynelle Arabian MD;  [facility];  Service: Orthopedics;  Laterality: Left;  561m  TRANSFORAMINAL LUMBAR INTERBODY FUSION (TLIF) WITH PEDICLE SCREW FIXATION 1 LEVEL N/A 01/28/2021   Procedure: TRANSFORAMINAL LUMBAR INTERBODY FUSION (TLIF) LUMBAR FIVE-SACRAL ONE;  Surgeon: Melina Schools, MD;  Location: [place];  Service: Orthopedics;  Laterality: N/A;  4 hrs   VIDEO ASSISTED THORACOSCOPY (VATS)/EMPYEMA Left 10/25/2016   Procedure: VIDEO ASSISTED THORACOSCOPY (VATS), BRONCH,DRAINAGE OF PLEURAL EFFUSION,PERICARDIAL WINDOW WITH DRAINAGE OF PERICARDIAL FLUID, TEE;  Surgeon: [name], MD;  Location: [place];  Service: Thoracic;  Laterality:  Left;   VIDEO BRONCHOSCOPY N/A 10/25/2016   Procedure: VIDEO BRONCHOSCOPY;  Surgeon: [name], MD;  Location: [place];  Service: Thoracic;  Laterality: N/A;   Social History   Socioeconomic History   Marital status: Married    Spouse name: [name]   Number of children: 2   Years of education: 12th grade   Highest education level: Not on file  Occupational History   Occupation: retired    Fish farm manager: UENPLOYED    Employer: RETIRED  Tobacco Use   Smoking status: Never   Smokeless tobacco: Never  Vaping Use   Vaping Use: Never used  Substance and Sexual Activity   Alcohol use: No    Alcohol/week: 0.0 standard drinks of alcohol   Drug use: No   Sexual activity: Yes    Partners: Male    Birth control/protection: None  Other Topics Concern   Not on file  Social History Narrative   1 son, 1 daughter.   2 granddaughters.   Retired [place] [employer].    Social Determinants of Health   Financial Resource Strain: Low Risk  (05/17/2022)   Overall Financial Resource Strain (CARDIA)    Difficulty of Paying Living Expenses: Not hard at all  Food Insecurity: No Food Insecurity (05/17/2022)   Hunger Vital Sign    Worried About Running Out of Food in the Last Year: Never true    Ran Out of Food in the Last Year: Never true  Transportation Needs: No Transportation Needs (05/17/2022)   PRAPARE - Hydrologist (Medical): No    Lack of Transportation (Non-Medical): No  Physical Activity: Sufficiently Active (05/17/2022)   Exercise Vital Sign    Days of Exercise per Week: 5 days    Minutes of Exercise per Session: 30 min  Stress: No Stress Concern Present (05/17/2022)   [place]    Feeling of Stress : Not at all  Social Connections: [place] (05/17/2022)   Social Connection and Isolation Panel [NHANES]    Frequency of Communication with Friends and Family: More than three times a week    Frequency  of Social Gatherings with Friends and Family: More than three times a week    Attends Religious Services: 1 to 4 times per year    Active Member of [employer] or Organizations: Yes    Attends Archivist Meetings: Never    Marital Status: Married   Outpatient Encounter Medications as of 08/11/2022  Medication Sig   IRON, FERROUS SULFATE, PO Take 1 tablet by mouth daily.   acetaminophen (TYLENOL) 500 MG tablet Take 1,000 mg by mouth every 8 (eight) hours as needed for moderate pain.   cetirizine (ZYRTEC) 10 MG tablet Take 10 mg by mouth daily.   Cholecalciferol (VITAMIN D3) 50 MCG (2000 UT) TABS Take 2,000 Units by mouth daily in the afternoon.   citalopram (CELEXA) 20 MG tablet TAKE 1 TABLET BY MOUTH EVERY DAY   lactose free nutrition (  BOOST) LIQD Take 237 mLs by mouth daily.   levothyroxine (SYNTHROID) 100 MCG tablet Take 1 tablet (100 mcg total) by mouth daily before breakfast.   Multiple Vitamins-Minerals (CENTRUM SILVER PO) Take 1 tablet by mouth daily in the afternoon.   nilotinib (TASIGNA) 150 MG capsule Take 2 capsules (300 mg total) by mouth every 12 (twelve) hours.   pantoprazole (PROTONIX) 40 MG tablet Take 1 tablet (40 mg total) by mouth daily. 30 minutes before breakfast   Probiotic Product (PROBIOTIC DAILY PO) Take 2 each by mouth daily in the afternoon.   sucralfate (CARAFATE) 1 g tablet TAKE 1 TABLET (1 G TOTAL) BY MOUTH 4 TIMES A DAY WITH MEALS AND AT BEDTIME   vitamin B-12 (CYANOCOBALAMIN) 1000 MCG tablet Take 1,000 mcg by mouth daily.   [DISCONTINUED] levothyroxine (SYNTHROID) 112 MCG tablet Take 112 mcg by mouth daily before breakfast.   No facility-administered encounter medications on file as of 08/11/2022.   ALLERGIES: Allergies  Allergen Reactions   Nucynta [Tapentadol Hcl] Swelling   Codeine Nausea Only and Other (See Comments)    Headache   Sulfonamide Derivatives Nausea Only and Other (See Comments)    Headache   Nexium [Esomeprazole Magnesium] Nausea Only    Sulfa Antibiotics Nausea Only   Fluconazole Rash    Redness and blistering on left thigh    VACCINATION STATUS: Immunization History  Administered Date(s) Administered   Fluad Quad(high Dose 65+) 09/11/2020, 09/24/2021   Influenza,inj,Quad PF,6+ Mos 09/13/2017   Influenza-Unspecified 09/24/2015, 11/24/2017   Moderna Sars-Covid-2 Vaccination 04/02/2020, 04/30/2020, 01/07/2021   Pneumococcal Conjugate-13 10/09/2014   Pneumococcal Polysaccharide-23 10/09/2014    HPI  76 year old female patient with medical history as above.   She is being seen in follow-up for hypothyroidism.  She remains on levothyroxine 112 mcg p.o. daily before breakfast.   Her previsit labs are consistent with over replacement.    She denies chest pain, shortness of breath. She denies palpitations, tremors, nor heat intolerance.   She is still on nilotinib for CML.    -She has family history of thyroid problem involving goiter which required thyroidectomy in her mother who took thyroid hormone replacement subsequently. -She denies dysphagia, shortness of breath, voice change. -She is compliant to her medications.  She also has significant dyslipidemia, not on treatment.  Review of Systems Limited as above.  Objective:    BP (!) 142/80   Pulse 80   Ht '5\' 5"'$  (1.651 m)   Wt 205 lb 12.8 oz (93.4 kg)   BMI 34.25 kg/m   Wt Readings from Last 3 Encounters:  08/11/22 205 lb 12.8 oz (93.4 kg)  07/20/22 203 lb 1.6 oz (92.1 kg)  06/28/22 200 lb 12.8 oz (91.1 kg)     CMP     Component Value Date/Time   NA 139 07/13/2022 0956   NA 141 04/28/2022 1430   K 4.6 07/13/2022 0956   CL 108 07/13/2022 0956   CO2 25 07/13/2022 0956   GLUCOSE 105 (H) 07/13/2022 0956   BUN 20 07/13/2022 0956   BUN 24 04/28/2022 1430   CREATININE 0.92 07/13/2022 0956   CREATININE 0.87 07/14/2011 0938   CALCIUM 9.0 07/13/2022 0956   PROT 7.6 07/13/2022 0956   PROT 7.4 04/28/2022 1430   ALBUMIN 3.9 07/13/2022 0956   ALBUMIN 4.4  04/28/2022 1430   AST 26 07/13/2022 0956   ALT 17 07/13/2022 0956   ALKPHOS 156 (H) 07/13/2022 0956   BILITOT 1.0 07/13/2022 0956   BILITOT 0.4 04/28/2022  [place] >60 07/13/2022 0956   GFRAA >60 08/21/2020 1340   Diabetic Labs (most recent): Lab Results  Component Value Date   HGBA1C 5.1 10/30/2021   HGBA1C 5.3 04/24/2019   HGBA1C 5.3 10/28/2016     Latest Reference Range & Units Most Recent  TSH 0.450 - 4.500 uIU/mL 0.014 (L) 08/02/22 11:18  T4,Free(Direct) 0.82 - 1.77 ng/dL 1.72 08/02/22 11:18  (L): Data is abnormally low  Latest Reference Range & Units Most Recent  Total CHOL/HDL Ratio 0.0 - 4.4 ratio 3.9 04/28/22 14:30  Cholesterol, Total 100 - 199 mg/dL 222 (H) 04/28/22 14:30  HDL Cholesterol >39 mg/dL 57 04/28/22 14:30  Triglycerides 0 - 149 mg/dL 233 (H) 04/28/22 14:30  VLDL Cholesterol Cal 5 - 40 mg/dL 41 (H) 04/28/22 14:30  LDL Chol Calc (NIH) 0 - 99 mg/dL 124 (H) 04/28/22 14:30  (H): Data is abnormally high  Assessment & Plan:   1. Hypothyroidism  2.  Hyperlipidemia - Her thyroid function tests are consistent with over replacement.  I discussed and lowered her levothyroxine to 100 mcg p.o. daily before breakfast.    - We discussed about the correct intake of her thyroid hormone, on empty stomach at fasting, with water, separated by at least 30 minutes from breakfast and other medications,  and separated by more than 4 hours from calcium, iron, multivitamins, acid reflux medications (PPIs). -Patient is made aware of the fact that thyroid hormone replacement is needed for life, dose to be adjusted by periodic monitoring of thyroid function tests.  Regarding her hyperlipidemia: She is hesitant to go on statins.  I discussed whole food plant-based diet with her.  She will need a repeat fasting lipid panel before her next visit.  If her LDL is greater than 100, she will be approached for statin intervention.  - Her clinical exam has been  negative for goiter,  hence no need for imaging of the thyroid at this time.   - I advised patient to maintain close follow up with Rakes, Connye Burkitt, FNP for primary care needs.   I spent 23 minutes in the care of the patient today including review of labs from Thyroid Function, CMP, and other relevant labs ; imaging/biopsy records (current and previous including abstractions from other facilities); face-to-face time discussing  her lab results and symptoms, medications doses, her options of short and long term treatment based on the latest standards of care / guidelines;   and documenting the encounter.  Andrea Martinez  participated in the discussions, expressed understanding, and voiced agreement with the above plans.  All questions were answered to her satisfaction. she is encouraged to contact clinic should she have any questions or concerns prior to her return visit.    Follow up plan: Return in about 6 months (around 02/10/2023) for Fasting Labs  in AM B4 8.  Glade Lloyd, MD Phone: [phone]  Fax: [phone]  -  This note was partially dictated with voice recognition software. Similar sounding words can be transcribed inadequately or may not  be corrected upon review.  08/11/2022, 3:02 PM

## 2022-08-11 NOTE — Patient Instructions (Signed)

## 2022-08-12 ENCOUNTER — Other Ambulatory Visit (HOSPITAL_COMMUNITY): Payer: Self-pay

## 2022-08-13 ENCOUNTER — Other Ambulatory Visit (HOSPITAL_COMMUNITY): Payer: Self-pay | Admitting: Family Medicine

## 2022-08-13 DIAGNOSIS — Z1231 Encounter for screening mammogram for malignant neoplasm of breast: Secondary | ICD-10-CM

## 2022-09-01 ENCOUNTER — Ambulatory Visit: Payer: Medicare Other | Admitting: "Endocrinology

## 2022-09-10 ENCOUNTER — Ambulatory Visit (HOSPITAL_COMMUNITY)
Admission: RE | Admit: 2022-09-10 | Discharge: 2022-09-10 | Disposition: A | Discharge status: Home / Self Care | Payer: PPO | Source: Ambulatory Visit | Attending: Family Medicine | Admitting: Family Medicine

## 2022-09-10 DIAGNOSIS — Z1231 Encounter for screening mammogram for malignant neoplasm of breast: Secondary | ICD-10-CM | POA: Diagnosis not present

## 2022-09-29 ENCOUNTER — Other Ambulatory Visit: Payer: Self-pay

## 2022-09-29 DIAGNOSIS — D509 Iron deficiency anemia, unspecified: Secondary | ICD-10-CM

## 2022-09-29 DIAGNOSIS — E039 Hypothyroidism, unspecified: Secondary | ICD-10-CM

## 2022-09-29 DIAGNOSIS — C9210 Chronic myeloid leukemia, BCR/ABL-positive, not having achieved remission: Secondary | ICD-10-CM

## 2022-09-29 NOTE — Progress Notes (Signed)
Orders placed for CBCd, Iron/TIBC, Ferritin and TSH per Dr. Delton Coombes as patient has called with reports of ongoing fatigue and not making any improvement since the start of iron tablets. Patient reports that she was instructed to make changes to her thyroid medication recently and she would like for her TSH to be checked also.

## 2022-10-01 ENCOUNTER — Inpatient Hospital Stay: Payer: PPO | Attending: Hematology

## 2022-10-01 DIAGNOSIS — C9210 Chronic myeloid leukemia, BCR/ABL-positive, not having achieved remission: Secondary | ICD-10-CM | POA: Diagnosis not present

## 2022-10-01 DIAGNOSIS — E039 Hypothyroidism, unspecified: Secondary | ICD-10-CM

## 2022-10-01 DIAGNOSIS — Z79899 Other long term (current) drug therapy: Secondary | ICD-10-CM | POA: Diagnosis not present

## 2022-10-01 DIAGNOSIS — D509 Iron deficiency anemia, unspecified: Secondary | ICD-10-CM

## 2022-10-01 LAB — CBC WITH DIFFERENTIAL/PLATELET
Abs Immature Granulocytes: 0.03 10*3/uL (ref 0.00–0.07)
Basophils Absolute: 0 10*3/uL (ref 0.0–0.1)
Basophils Relative: 0 %
Eosinophils Absolute: 0.2 10*3/uL (ref 0.0–0.5)
Eosinophils Relative: 2 %
HCT: 37.1 % (ref 36.0–46.0)
Hemoglobin: 11.6 g/dL — ABNORMAL LOW (ref 12.0–15.0)
Immature Granulocytes: 0 %
Lymphocytes Relative: 15 %
Lymphs Abs: 1.3 10*3/uL (ref 0.7–4.0)
MCH: 27.2 pg (ref 26.0–34.0)
MCHC: 31.3 g/dL (ref 30.0–36.0)
MCV: 86.9 fL (ref 80.0–100.0)
Monocytes Absolute: 0.5 10*3/uL (ref 0.1–1.0)
Monocytes Relative: 6 %
Neutro Abs: 6.5 10*3/uL (ref 1.7–7.7)
Neutrophils Relative %: 77 %
Platelets: 267 10*3/uL (ref 150–400)
RBC: 4.27 MIL/uL (ref 3.87–5.11)
RDW: 14 % (ref 11.5–15.5)
WBC: 8.5 10*3/uL (ref 4.0–10.5)
nRBC: 0 % (ref 0.0–0.2)

## 2022-10-01 LAB — TSH: TSH: 0.03 u[IU]/mL — ABNORMAL LOW (ref 0.350–4.500)

## 2022-10-01 LAB — IRON AND TIBC
Iron: 43 ug/dL (ref 28–170)
Saturation Ratios: 13 % (ref 10.4–31.8)
TIBC: 333 ug/dL (ref 250–450)
UIBC: 290 ug/dL

## 2022-10-01 LAB — FERRITIN: Ferritin: 63 ng/mL (ref 11–307)

## 2022-10-05 ENCOUNTER — Ambulatory Visit: Payer: Medicare Other | Admitting: "Endocrinology

## 2022-10-06 ENCOUNTER — Telehealth: Payer: Self-pay | Admitting: *Deleted

## 2022-10-06 NOTE — Telephone Encounter (Signed)
Patient called to check on lab results from 10/20.  States that she is having profound fatigue and sleeps all of the time.  Will have Dr. Delton Coombes review labs and request recommendations.

## 2022-10-06 NOTE — Telephone Encounter (Signed)
Patient called and message left on answering machine in reference to above recommendations, per her request to leave on VM.  Labs forwarded to Dr. Dorris Fetch.

## 2022-10-07 ENCOUNTER — Telehealth: Payer: Self-pay | Admitting: "Endocrinology

## 2022-10-07 NOTE — Telephone Encounter (Signed)
Dr Dorris Fetch Patient left a VM stating that her thyroid levels are low according to her PCP. She said please review those as she is still very weak and thinks it may be coming from that  Thank you

## 2022-10-07 NOTE — Telephone Encounter (Signed)
Patient said she will go do those by Monday and call us back

## 2022-10-09 LAB — LIPID PANEL
Chol/HDL Ratio: 3.4 ratio (ref 0.0–4.4)
Cholesterol, Total: 217 mg/dL — ABNORMAL HIGH (ref 100–199)
HDL: 64 mg/dL (ref >39)
LDL Chol Calc (NIH): 126 mg/dL — ABNORMAL HIGH (ref 0–99)
Triglycerides: 152 mg/dL — ABNORMAL HIGH (ref 0–149)
VLDL Cholesterol Cal: 27 mg/dL (ref 5–40)

## 2022-10-09 LAB — TSH: TSH: 0.026 u[IU]/mL — ABNORMAL LOW (ref 0.450–4.500)

## 2022-10-09 LAB — T4, FREE: Free T4: 1.39 ng/dL (ref 0.82–1.77)

## 2022-10-11 NOTE — Telephone Encounter (Signed)
Pt did her labs, can you review

## 2022-10-14 ENCOUNTER — Other Ambulatory Visit: Payer: Self-pay | Admitting: "Endocrinology

## 2022-10-14 DIAGNOSIS — E039 Hypothyroidism, unspecified: Secondary | ICD-10-CM

## 2022-10-14 MED ORDER — LEVOTHYROXINE SODIUM 88 MCG PO TABS: 88.0000 ug | ORAL_TABLET | Freq: Every day | ORAL | 2 refills | Status: DC

## 2022-10-14 NOTE — Telephone Encounter (Signed)
Can you add new orders

## 2022-10-19 ENCOUNTER — Other Ambulatory Visit (HOSPITAL_COMMUNITY): Payer: Self-pay

## 2022-10-21 ENCOUNTER — Telehealth: Payer: Self-pay

## 2022-10-21 NOTE — Telephone Encounter (Signed)
Oral Oncology Patient Advocate Encounter   Received notification that patient is due for re-enrollment for assistance for Tasigna through [employer].   Re-enrollment process has been initiated and will be submitted upon completion of necessary documents.  [employer]' phone number [phone].   I will continue to follow until final determination.  Berdine Addison, [facility] [place]  [phone] (phone) [phone] (fax) 10/21/2022 11:54 AM

## 2022-10-21 NOTE — Telephone Encounter (Signed)
Patient requested I mail her application for signature. Application mailed to patient's verified address and she will return when complete for processing.   Berdine Addison, [facility] [place]  [phone] (phone) [phone] (fax) 10/21/2022 12:47 PM

## 2022-10-22 ENCOUNTER — Encounter: Payer: Self-pay | Admitting: Family Medicine

## 2022-10-22 ENCOUNTER — Ambulatory Visit (INDEPENDENT_AMBULATORY_CARE_PROVIDER_SITE_OTHER): Payer: PPO | Admitting: Family Medicine

## 2022-10-22 VITALS — BP 124/63 | HR 75 | Temp 96.2°F | Ht 65.0 in | Wt 209.8 lb

## 2022-10-22 DIAGNOSIS — F320 Major depressive disorder, single episode, mild: Secondary | ICD-10-CM

## 2022-10-22 DIAGNOSIS — R5381 Other malaise: Secondary | ICD-10-CM

## 2022-10-22 DIAGNOSIS — F419 Anxiety disorder, unspecified: Secondary | ICD-10-CM

## 2022-10-22 DIAGNOSIS — R5383 Other fatigue: Secondary | ICD-10-CM | POA: Diagnosis not present

## 2022-10-22 DIAGNOSIS — E538 Deficiency of other specified B group vitamins: Secondary | ICD-10-CM | POA: Insufficient documentation

## 2022-10-22 DIAGNOSIS — E559 Vitamin D deficiency, unspecified: Secondary | ICD-10-CM | POA: Insufficient documentation

## 2022-10-22 MED ORDER — CITALOPRAM HYDROBROMIDE 20 MG PO TABS: 20.0000 mg | ORAL_TABLET | Freq: Every day | ORAL | 1 refills | Status: DC

## 2022-10-22 NOTE — Progress Notes (Signed)
Subjective:  Patient ID: Andrea Martinez, female    DOB: 1945-12-26, 76 y.o.   MRN: [id number]  Patient Care Team: Baruch Gouty, FNP as PCP - General (Family Medicine) Derek Jack, MD as Medical Oncologist (Medical Oncology) Cassandria Anger, MD as Consulting Physician (Endocrinology) Melina Schools, MD as Consulting Physician (Orthopedic Surgery)   Chief Complaint:  [place] [facility] patient ), Fatigue (Patient states she is tried all the time for the last few months.  Wondering if its from the Celexa?), and Depression   HPI: Andrea Martinez is a 76 y.o. female presenting on 10/22/2022 for Establish Care [facility] patient ), Fatigue (Patient states she is tried all the time for the last few months.  Wondering if its from the Celexa?), and Depression   Pt presents today to establish care with new PCP as her former PCP is no longer at this practice. She is following up for depression and anxiety. She was started on Celexa and has been taking as prescribed. Since starting the medications she feels her mood has improved slightly but feels she has more fatigue and malaise. She has been taking the medication at bedtime. No other reported side effects / adverse reactions.      10/22/2022   10:20 AM 06/22/2022   11:07 AM 05/17/2022    8:23 AM 03/30/2022    9:26 AM 02/11/2022   11:18 AM  Depression screen PHQ 2/9  Decreased Interest 1 1 0 1 2  Down, Depressed, Hopeless 1 0 0 0 1  PHQ - 2 Score 2 1 0 1 3  Altered sleeping 2 1 0 1 2  Tired, decreased energy 2 2 0 1 2  Change in appetite 1 1 0 2 2  Feeling bad or failure about yourself  0 0 0 0 0  Trouble concentrating 0 0 0 1 1  Moving slowly or fidgety/restless 0 0 0 0 0  Suicidal thoughts 0 0 0 0 0  PHQ-9 Score 7 5 0 6 10  Difficult doing work/chores Not difficult at all Not difficult at all  Not difficult at all Not difficult at all      10/22/2022   10:20 AM 06/22/2022   11:07 AM 03/30/2022    9:26 AM 02/11/2022   11:18  AM  GAD 7 : Generalized Anxiety Score  Nervous, Anxious, on Edge 1 1 0 1  Control/stop worrying 0 1 0 1  Worry too much - different things 1 0 1 1  Trouble relaxing 0 0 0 0  Restless 0 0 0 0  Easily annoyed or irritable 0 0 0 0  Afraid - awful might happen 0 0 0 0  Total GAD 7 Score '2 2 1 3  '$ Anxiety Difficulty Not difficult at all Not difficult at all Not difficult at all Not difficult at all       Relevant past medical, surgical, family, and social history reviewed and updated as indicated.  Allergies and medications reviewed and updated. Data reviewed: Chart in Epic.   Past Medical History:  Diagnosis Date   Anxiety    denies   Arthritis    Chronic myelogenous leukemia (CML), BCR-ABL1-positive ([place]) 11/20/2015   Gastric ulcer 2011   EGD, 5/11   History of peptic ulcer disease    Hypothyroidism    not on meds, followed by Dr. Elyse Hsu   Lipoma    left upper arm   Mixed hyperlipidemia    Osteoporosis    Pericardial  effusion    a. HCAP complicated by pericardial effusion requiring pericardial window 10/2016 and large L pleural effusion requring VATS.   Pleural effusion    a. s/p VATS 2017.   Pneumonia 10/2016   PONV (postoperative nausea and vomiting)    history of   Prolonged QT interval    Urine incontinence    UTI (lower urinary tract infection)    frequent   Vitamin D deficiency     Past Surgical History:  Procedure Laterality Date   BIOPSY  03/20/2022   Procedure: BIOPSY;  Surgeon: Eloise Harman, DO;  [facility];  Service: Endoscopy;;  gastric lesion vs. gastric ulcer   BIOPSY  06/28/2022   Procedure: BIOPSY;  Surgeon: Eloise Harman, DO;  [facility];  Service: Endoscopy;;   BRAVO [place] STUDY  12/04/2012   Procedure: BRAVO [place];  Surgeon: [name], MD;  [facility];  Service: Endoscopy;  Laterality: N/A;   CHOLECYSTECTOMY N/A 01/03/2014   Procedure: LAPAROSCOPIC CHOLECYSTECTOMY WITH INTRAOPERATIVE  CHOLANGIOGRAM;  Surgeon: Joyice Faster. Cornett, MD;  Location: [place];  Service: General;  Laterality: N/A;   COLONOSCOPY  01/2004   DR [facility], few small tics   ESOPHAGOGASTRODUODENOSCOPY  02/2010   gastric ulcers   ESOPHAGOGASTRODUODENOSCOPY  12/04/2012   CWC:BJSEGBT gastritis (inflammation) was found in the gastric antrum; multiple biopsies The duodenal mucosa showed no abnormalities in the bulb and second portion of the duodenum   ESOPHAGOGASTRODUODENOSCOPY (EGD) WITH PROPOFOL N/A 03/20/2022   Procedure: ESOPHAGOGASTRODUODENOSCOPY (EGD) WITH PROPOFOL;  Surgeon: Eloise Harman, DO;  [facility];  Service: Endoscopy;  Laterality: N/A;   ESOPHAGOGASTRODUODENOSCOPY (EGD) WITH PROPOFOL N/A 06/28/2022   Procedure: ESOPHAGOGASTRODUODENOSCOPY (EGD) WITH PROPOFOL;  Surgeon: Eloise Harman, DO;  [facility];  Service: Endoscopy;  Laterality: N/A;  800   JOINT REPLACEMENT Right 10/22/2019   KYPHOPLASTY N/A 02/14/2019   Procedure: KYPHOPLASTY T12 and L1;  Surgeon: Melina Schools, MD;  Location: [place];  Service: Orthopedics;  Laterality: N/A;  120 mins   LIPOMA EXCISION  08/02/2011   left shoulder   NOSE SURGERY     PARTIAL HYSTERECTOMY     vaginal at age 59 years of age   TOE 75 Left 1962   lt great toe   TOTAL HIP ARTHROPLASTY Right 11/18/2021   Procedure: TOTAL HIP ARTHROPLASTY ANTERIOR APPROACH;  Surgeon: Gaynelle Arabian, MD;  [facility];  Service: Orthopedics;  Laterality: Right;   TOTAL KNEE ARTHROPLASTY Right 10/22/2019   Procedure: TOTAL KNEE ARTHROPLASTY;  Surgeon: Gaynelle Arabian, MD;  [facility];  Service: Orthopedics;  Laterality: Right;  16mn   TOTAL KNEE ARTHROPLASTY Left 03/03/2020   Procedure: TOTAL KNEE ARTHROPLASTY;  Surgeon: AGaynelle Arabian MD;  [facility];  Service: Orthopedics;  Laterality: Left;  534m   TRANSFORAMINAL LUMBAR INTERBODY FUSION (TLIF) WITH PEDICLE SCREW FIXATION 1 LEVEL N/A 01/28/2021   Procedure:  TRANSFORAMINAL LUMBAR INTERBODY FUSION (TLIF) LUMBAR FIVE-SACRAL ONE;  Surgeon: BrMelina SchoolsMD;  Location[facility] Service: Orthopedics;  Laterality: N/A;  4 hrs   VIDEO ASSISTED THORACOSCOPY (VATS)/EMPYEMA Left 10/25/2016   Procedure: VIDEO ASSISTED THORACOSCOPY (VATS), BRONCH,DRAINAGE OF PLEURAL EFFUSION,PERICARDIAL WINDOW WITH DRAINAGE OF PERICARDIAL FLUID, TEE;  Surgeon: StMelrose NakayamaMD;  [facility] Service: Thoracic;  Laterality: Left;   VIDEO BRONCHOSCOPY N/A 10/25/2016   Procedure: VIDEO BRONCHOSCOPY;  Surgeon: StMelrose NakayamaMD;  [facility] Service: Thoracic;  Laterality: N/A;    Social History   Socioeconomic History   Marital status: Married    Spouse name: [name]   Number of children: 2   Years of education: 12th grade   Highest education level: Not on file  Occupational History   Occupation: retired    Fish farm manager: UENPLOYED    Employer: RETIRED  Tobacco Use   Smoking status: Never   Smokeless tobacco: Never  Vaping Use   Vaping Use: Never used  Substance and Sexual Activity   Alcohol use: No    Alcohol/week: 0.0 standard drinks of alcohol   Drug use: No   Sexual activity: Yes    Partners: Male    Birth control/protection: None  Other Topics Concern   Not on file  Social History Narrative   1 son, 1 daughter.   2 granddaughters.   Retired [place].    Social Determinants of Health   Financial Resource Strain: Low Risk  (05/17/2022)   Overall Financial Resource Strain (CARDIA)    Difficulty of Paying Living Expenses: Not hard at all  Food Insecurity: No Food Insecurity (05/17/2022)   Hunger Vital Sign    Worried About Running Out of Food in the Last Year: Never true    Ran Out of Food in the Last Year: Never true  Transportation Needs: No Transportation Needs (05/17/2022)   PRAPARE - Hydrologist (Medical): No    Lack of Transportation (Non-Medical): No  Physical Activity: Sufficiently Active (05/17/2022)    Exercise Vital Sign    Days of Exercise per Week: 5 days    Minutes of Exercise per Session: 30 min  Stress: No Stress Concern Present (05/17/2022)   [place]    Feeling of Stress : Not at all  Social Connections: [place] (05/17/2022)   Social Connection and Isolation Panel [NHANES]    Frequency of Communication with Friends and Family: More than three times a week    Frequency of Social Gatherings with Friends and Family: More than three times a week    Attends Religious Services: 1 to 4 times per year    Active Member of [employer] or Organizations: Yes    Attends Archivist Meetings: Never    Marital Status: Married  Human resources officer Violence: Not At Risk (05/17/2022)   Humiliation, Afraid, Rape, and Kick questionnaire    Fear of Current or Ex-Partner: No    Emotionally Abused: No    Physically Abused: No    Sexually Abused: No    Outpatient Encounter Medications as of 10/22/2022  Medication Sig   acetaminophen (TYLENOL) 500 MG tablet Take 1,000 mg by mouth every 8 (eight) hours as needed for moderate pain.   cetirizine (ZYRTEC) 10 MG tablet Take 10 mg by mouth daily.   Cholecalciferol (VITAMIN D3) 50 MCG (2000 UT) TABS Take 2,000 Units by mouth daily in the afternoon.   lactose free nutrition (BOOST) LIQD Take 237 mLs by mouth daily.   levothyroxine (SYNTHROID) 88 MCG tablet Take 1 tablet (88 mcg total) by mouth daily before breakfast.   Multiple Vitamins-Minerals (CENTRUM SILVER PO) Take 1 tablet by mouth daily in the afternoon.   nilotinib (TASIGNA) 150 MG capsule Take 2 capsules (300 mg total) by mouth every 12 (twelve) hours.   Probiotic Product (PROBIOTIC DAILY PO) Take 2 each by mouth daily in the afternoon.   vitamin B-12 (CYANOCOBALAMIN) 1000 MCG tablet Take 1,000 mcg by mouth daily.   [  DISCONTINUED] citalopram (CELEXA) 20 MG tablet TAKE 1 TABLET BY MOUTH EVERY DAY   citalopram (CELEXA) 20 MG  tablet Take 1 tablet (20 mg total) by mouth daily.   [DISCONTINUED] IRON, FERROUS SULFATE, PO Take 1 tablet by mouth daily.   [DISCONTINUED] pantoprazole (PROTONIX) 40 MG tablet Take 1 tablet (40 mg total) by mouth daily. 30 minutes before breakfast   [DISCONTINUED] sucralfate (CARAFATE) 1 g tablet TAKE 1 TABLET (1 G TOTAL) BY MOUTH 4 TIMES A DAY WITH MEALS AND AT BEDTIME   No facility-administered encounter medications on file as of 10/22/2022.    Allergies  Allergen Reactions   Nucynta [Tapentadol Hcl] Swelling   Codeine Nausea Only and Other (See Comments)    Headache   Sulfonamide Derivatives Nausea Only and Other (See Comments)    Headache   Nexium [Esomeprazole Magnesium] Nausea Only   Sulfa Antibiotics Nausea Only and Other (See Comments)   Fluconazole Rash and Other (See Comments)    Redness and blistering on left thigh    Review of Systems  Constitutional:  Positive for activity change, appetite change and fatigue. Negative for chills, diaphoresis, fever and unexpected weight change.  HENT: Negative.    Eyes: Negative.   Respiratory:  Negative for cough, chest tightness and shortness of breath.   Cardiovascular:  Negative for chest pain, palpitations and leg swelling.  Gastrointestinal:  Negative for abdominal pain, blood in stool, constipation, diarrhea, nausea and vomiting.  Endocrine: Negative.   Genitourinary:  Negative for decreased urine volume, difficulty urinating, dysuria, frequency and urgency.  Musculoskeletal:  Negative for arthralgias and myalgias.  Skin: Negative.   Allergic/Immunologic: Negative.   Neurological:  Negative for dizziness, weakness and headaches.  Hematological: Negative.   Psychiatric/Behavioral:  Positive for sleep disturbance. Negative for agitation, behavioral problems, confusion, decreased concentration, dysphoric mood, hallucinations, self-injury and suicidal ideas. The patient is nervous/anxious. The patient is not hyperactive.   All  other systems reviewed and are negative.       Objective:  BP 124/63   Pulse 75   Temp (!) 96.2 F (35.7 C) (Temporal)   Ht '5\' 5"'$  (1.651 m)   Wt 209 lb 12.8 oz (95.2 kg)   SpO2 97%   BMI 34.91 kg/m    Wt Readings from Last 3 Encounters:  10/22/22 209 lb 12.8 oz (95.2 kg)  08/11/22 205 lb 12.8 oz (93.4 kg)  07/20/22 203 lb 1.6 oz (92.1 kg)    Physical Exam Vitals and nursing note reviewed.  Constitutional:      Appearance: Normal appearance. She is well-developed and well-groomed. She is obese.  HENT:     Head: Normocephalic and atraumatic.     Jaw: There is normal jaw occlusion.     Right Ear: Hearing normal.     Left Ear: Hearing normal.     Nose: Nose normal.     Mouth/Throat:     Lips: Pink.     Mouth: Mucous membranes are moist.     Pharynx: Uvula midline.  Eyes:     General: Lids are normal.     Pupils: Pupils are equal, round, and reactive to light.  Neck:     Thyroid: No thyroid mass, thyromegaly or thyroid tenderness.     Vascular: No JVD.     Trachea: Trachea and phonation normal.  Cardiovascular:     Rate and Rhythm: Normal rate and regular rhythm.     Chest Wall: PMI is not displaced.     Pulses: Normal pulses.  Heart sounds: Normal heart sounds. No murmur heard.    No friction rub. No gallop.  Pulmonary:     Effort: Pulmonary effort is normal.  Abdominal:     General: There is no abdominal bruit.     Palpations: Abdomen is soft. There is no hepatomegaly or splenomegaly.  Musculoskeletal:     Right lower leg: No edema.     Left lower leg: No edema.  Skin:    General: Skin is warm and dry.     Capillary Refill: Capillary refill takes less than 2 seconds.     Coloration: Skin is not cyanotic, jaundiced or pale.     Findings: No rash.  Neurological:     General: No focal deficit present.     Mental Status: She is alert and oriented to person, place, and time.     Sensory: Sensation is intact.     Motor: Motor function is intact.      Coordination: Coordination is intact.     Gait: Gait is intact.     Deep Tendon Reflexes: Reflexes are normal and symmetric.  Psychiatric:        Attention and Perception: Attention and perception normal.        Mood and Affect: Mood and affect normal.        Speech: Speech normal.        Behavior: Behavior normal. Behavior is cooperative.        Thought Content: Thought content normal.        Cognition and Memory: Cognition and memory normal.        Judgment: Judgment normal.     Results for orders placed or performed in visit on 10/01/22  CBC with Differential  Result Value Ref Range   WBC 8.5 4.0 - 10.5 K/uL   RBC 4.27 3.87 - 5.11 MIL/uL   Hemoglobin 11.6 (L) 12.0 - 15.0 g/dL   HCT 37.1 36.0 - 46.0 %   MCV 86.9 80.0 - 100.0 fL   MCH 27.2 26.0 - 34.0 pg   MCHC 31.3 30.0 - 36.0 g/dL   RDW 14.0 11.5 - 15.5 %   Platelets 267 150 - 400 K/uL   nRBC 0.0 0.0 - 0.2 %   Neutrophils Relative % 77 %   Neutro Abs 6.5 1.7 - 7.7 K/uL   Lymphocytes Relative 15 %   Lymphs Abs 1.3 0.7 - 4.0 K/uL   Monocytes Relative 6 %   Monocytes Absolute 0.5 0.1 - 1.0 K/uL   Eosinophils Relative 2 %   Eosinophils Absolute 0.2 0.0 - 0.5 K/uL   Basophils Relative 0 %   Basophils Absolute 0.0 0.0 - 0.1 K/uL   Immature Granulocytes 0 %   Abs Immature Granulocytes 0.03 0.00 - 0.07 K/uL  Iron and TIBC ([facility] ONLY)  Result Value Ref Range   Iron 43 28 - 170 ug/dL   TIBC 333 250 - 450 ug/dL   Saturation Ratios 13 10.4 - 31.8 %   UIBC 290 ug/dL  Ferritin  Result Value Ref Range   Ferritin 63 11 - 307 ng/mL  TSH  Result Value Ref Range   TSH 0.030 (L) 0.350 - 4.500 uIU/mL       Pertinent labs & imaging results that were available during my care of the patient were reviewed by me and considered in my medical decision making.  Assessment & Plan:  Maryna was seen today for establish care, medical management of chronic issues and fatigue.  Diagnoses and  all orders for this  visit:  Major depressive disorder, single episode, mild degree (HCC) Anxiety Malaise and fatigue Feels symptoms have improved but she does feel slight fatigue and malaise since starting the medications. She has been dosing at night. Will switch to daytime dosing to see if beneficial. If this does not work, consider different medication. Recent labs from oncology and endocrinology reviewed.  -     citalopram (CELEXA) 20 MG tablet; Take 1 tablet (20 mg total) by mouth daily.   Continue all other maintenance medications.  Follow up plan: Return in about 4 months (around 02/20/2023), or if symptoms worsen or fail to improve, for .mrde.   Continue healthy lifestyle choices, including diet (rich in fruits, vegetables, and lean proteins, and low in salt and simple carbohydrates) and exercise (at least 30 minutes of moderate physical activity daily).  Educational handout given for depression  The above assessment and management plan was discussed with the patient. The patient verbalized understanding of and has agreed to the management plan. Patient is aware to call the clinic if they develop any new symptoms or if symptoms persist or worsen. Patient is aware when to return to the clinic for a follow-up visit. Patient educated on when it is appropriate to go to the emergency department.   Monia Pouch, FNP-C [facility] [phone]

## 2022-10-22 NOTE — Patient Instructions (Signed)
If your symptoms worsen or you have thoughts of suicide/homicide, PLEASE SEEK IMMEDIATE MEDICAL ATTENTION.  You may always call the National Suicide Hotline.  This is available 24 hours a day, 7 days a week.  Their number is: 1-800-273-8255 ° °Taking the medicine as directed and not missing any doses is one of the best things you can do to treat your depression.  Here are some things to keep in mind: ° °Side effects (stomach upset, some increased anxiety) may happen before you notice a benefit.  These side effects typically go away over time. °Changes to your dose of medicine or a change in medication all together is sometimes necessary °Most people need to be on medication at least 12 months °Many people will notice an improvement within two weeks but the full effect of the medication can take up to 4-6 weeks °Stopping the medication when you start feeling better often results in a return of symptoms °Never discontinue your medication without contacting a health care professional first.  Some medications require gradual discontinuation/ taper and can make you sick if you stop them abruptly. ° °If your symptoms worsen or you have thoughts of suicide/homicide, PLEASE SEEK IMMEDIATE MEDICAL ATTENTION.  You may always call: ° °National Suicide Hotline: 800-273-8255 °[place] Crisis Line: 336-832-9700 °Crisis Recovery in [place]: 800-939-5911 ° °These are available 24 hours a day, 7 days a week. ° °

## 2022-10-28 ENCOUNTER — Ambulatory Visit (INDEPENDENT_AMBULATORY_CARE_PROVIDER_SITE_OTHER): Payer: PPO | Admitting: Family Medicine

## 2022-10-28 ENCOUNTER — Encounter: Payer: Self-pay | Admitting: Family Medicine

## 2022-10-28 DIAGNOSIS — R300 Dysuria: Secondary | ICD-10-CM

## 2022-10-28 LAB — URINALYSIS, ROUTINE W REFLEX MICROSCOPIC
Bilirubin, UA: NEGATIVE
Glucose, UA: NEGATIVE
Ketones, UA: NEGATIVE
Nitrite, UA: NEGATIVE
Protein,UA: NEGATIVE
RBC, UA: NEGATIVE
Specific Gravity, UA: 1.01 (ref 1.005–1.030)
Urobilinogen, Ur: 0.2 mg/dL (ref 0.2–1.0)
pH, UA: 5 (ref 5.0–7.5)

## 2022-10-28 LAB — MICROSCOPIC EXAMINATION
Bacteria, UA: NONE SEEN
Epithelial Cells (non renal): NONE SEEN /hpf (ref 0–10)
Renal Epithel, UA: NONE SEEN /hpf

## 2022-10-28 NOTE — Progress Notes (Signed)
Virtual Visit via telephone Note Due to COVID-19 pandemic this visit was conducted virtually. This visit type was conducted due to national recommendations for restrictions regarding the COVID-19 Pandemic (e.g. social distancing, sheltering in place) in an effort to limit this patient's exposure and mitigate transmission in our community. All issues noted in this document were discussed and addressed.  A physical exam was not performed with this format.   I connected with Andrea Martinez on 10/28/2022 at [phone] by telephone and verified that I am speaking with the correct person using two identifiers. Andrea Martinez is currently located at home and patient is currently with them during visit. The provider, Monia Pouch, FNP is located in their office at time of visit.  I discussed the limitations, risks, security and privacy concerns of performing an evaluation and management service by virtual visit and the availability of in person appointments. I also discussed with the patient that there may be a patient responsible charge related to this service. The patient expressed understanding and agreed to proceed.  Subjective:  Patient ID: Andrea Martinez, female    DOB: 09/05/1946, 76 y.o.   MRN: [id number]  Chief Complaint:  Dysuria   HPI: Andrea Martinez is a 76 y.o. female presenting on 10/28/2022 for Dysuria   Pt states she woke up this morning with lower abdominal pressure with voiding and dysuria.   Dysuria  This is a new problem. The current episode started yesterday. The problem occurs every urination. The problem has been unchanged. The quality of the pain is described as burning and aching. The pain is moderate. There has been no fever. She is Not sexually active. There is No history of pyelonephritis. Pertinent negatives include no chills, discharge, flank pain, frequency, hematuria, hesitancy, nausea, possible pregnancy, sweats, urgency or vomiting. She has tried nothing for the symptoms. The  treatment provided no relief.     Relevant past medical, surgical, family, and social history reviewed and updated as indicated.  Allergies and medications reviewed and updated.   Past Medical History:  Diagnosis Date   Anxiety    denies   Arthritis    Chronic myelogenous leukemia (CML), BCR-ABL1-positive ([place]) 11/20/2015   Gastric ulcer 2011   EGD, 5/11   History of peptic ulcer disease    Hypothyroidism    not on meds, followed by Dr. Elyse Hsu   Lipoma    left upper arm   Mixed hyperlipidemia    Osteoporosis    Pericardial effusion    a. HCAP complicated by pericardial effusion requiring pericardial window 10/2016 and large L pleural effusion requring VATS.   Pleural effusion    a. s/p VATS 2017.   Pneumonia 10/2016   PONV (postoperative nausea and vomiting)    history of   Prolonged QT interval    Urine incontinence    UTI (lower urinary tract infection)    frequent   Vitamin D deficiency     Past Surgical History:  Procedure Laterality Date   BIOPSY  03/20/2022   Procedure: BIOPSY;  Surgeon: Eloise Harman, DO;  [facility];  Service: Endoscopy;;  gastric lesion vs. gastric ulcer   BIOPSY  06/28/2022   Procedure: BIOPSY;  Surgeon: Eloise Harman, DO;  [facility];  Service: Endoscopy;;   BRAVO [place] STUDY  12/04/2012   Procedure: BRAVO [place];  Surgeon: [name], MD;  [facility];  Service: Endoscopy;  Laterality: N/A;   CHOLECYSTECTOMY N/A 01/03/2014  Procedure: LAPAROSCOPIC CHOLECYSTECTOMY WITH INTRAOPERATIVE CHOLANGIOGRAM;  Surgeon: Joyice Faster. Cornett, MD;  Location: [facility];  Service: General;  Laterality: N/A;   COLONOSCOPY  01/2004   [facility], few small tics   ESOPHAGOGASTRODUODENOSCOPY  02/2010   gastric ulcers   ESOPHAGOGASTRODUODENOSCOPY  12/04/2012   [id number] gastritis (inflammation) was found in the gastric antrum; multiple biopsies The duodenal mucosa showed no abnormalities in the bulb  and second portion of the duodenum   ESOPHAGOGASTRODUODENOSCOPY (EGD) WITH PROPOFOL N/A 03/20/2022   Procedure: ESOPHAGOGASTRODUODENOSCOPY (EGD) WITH PROPOFOL;  Surgeon: Eloise Harman, DO;  [facility];  Service: Endoscopy;  Laterality: N/A;   ESOPHAGOGASTRODUODENOSCOPY (EGD) WITH PROPOFOL N/A 06/28/2022   Procedure: ESOPHAGOGASTRODUODENOSCOPY (EGD) WITH PROPOFOL;  Surgeon: Eloise Harman, DO;  [facility];  Service: Endoscopy;  Laterality: N/A;  800   JOINT REPLACEMENT Right 10/22/2019   KYPHOPLASTY N/A 02/14/2019   Procedure: KYPHOPLASTY T12 and L1;  Surgeon: Melina Schools, MD;  Location: [facility];  Service: Orthopedics;  Laterality: N/A;  120 mins   LIPOMA EXCISION  08/02/2011   left shoulder   NOSE SURGERY     PARTIAL HYSTERECTOMY     vaginal at age 58 years of age   TOE 27 Left 1962   lt great toe   TOTAL HIP ARTHROPLASTY Right 11/18/2021   Procedure: TOTAL HIP ARTHROPLASTY ANTERIOR APPROACH;  Surgeon: Gaynelle Arabian, MD;  [facility];  Service: Orthopedics;  Laterality: Right;   TOTAL KNEE ARTHROPLASTY Right 10/22/2019   Procedure: TOTAL KNEE ARTHROPLASTY;  Surgeon: Gaynelle Arabian, MD;  [facility];  Service: Orthopedics;  Laterality: Right;  59mn   TOTAL KNEE ARTHROPLASTY Left 03/03/2020   Procedure: TOTAL KNEE ARTHROPLASTY;  Surgeon: AGaynelle Arabian MD;  [facility];  Service: Orthopedics;  Laterality: Left;  518m   TRANSFORAMINAL LUMBAR INTERBODY FUSION (TLIF) WITH PEDICLE SCREW FIXATION 1 LEVEL N/A 01/28/2021   Procedure: TRANSFORAMINAL LUMBAR INTERBODY FUSION (TLIF) LUMBAR FIVE-SACRAL ONE;  Surgeon: BrMelina SchoolsMD;  [place] [facility] Service: Orth[place]ics;  Laterality: N/A;  4 hrs   VIDEO ASSISTED THORACOSCOPY (VATS)/EMPYEMA Left 10/25/2016   Procedure: VIDEO ASSISTED THORACOSCOPY (VATS), BRONCH,DRAINAGE OF PLEURAL EFFUSION,PERICARDIAL WINDOW WITH DRAINAGE OF PERICARDIAL FLUID, TEE;  Surgeon: StMelrose NakayamaMD;  [place] [facility] Service: Thoracic;  Laterality: Left;   VIDEO BRONCHOSCOPY N/A 10/25/2016   Procedure: VIDEO BRONCHOSCOPY;  Surgeon: StMelrose NakayamaMD;  [place] [facility] Service: Thoracic;  Laterality: N/A;    Social History   Socioeconomic History   Marital status: Married    Spouse name: GeIona Beard Number of children: 2   Years of education: 12th grade   Highest education level: Not on file  Occupational History   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UENPLOYED    Employer: RETIRED  Tobacco Use   Smoking status: Never   Smokeless tobacco: Never  Vaping Use   Vaping Use: Never used  Substance and Sexual Activity   Alcohol use: No    Alcohol/week: 0.0 standard drinks of alcohol   Drug use: No   Sexual activity: Yes    Partners: Male    Birth control/protection: None  Other Topics Concern   Not on file  Social History Narrative   1 son, 1 daughter.   2 granddaughters.   Retired [employer]   Social Determinants of Health   Financial Resource Strain: Low Risk  (05/17/2022)   Overall Financial Resource Strain (CARDIA)  Difficulty of Paying Living Expenses: Not hard at all  Food Insecurity: No Food Insecurity (05/17/2022)   Hunger Vital Sign    Worried About Running Out of Food in the Last Year: Never true    Ran Out of Food in the Last Year: Never true  Transportation Needs: No Transportation Needs (05/17/2022)   PRAPARE - Hydrologist (Medical): No    Lack of Transportation (Non-Medical): No  Physical Activity: Sufficiently Active (05/17/2022)   Exercise Vital Sign    Days of Exercise per Week: 5 days    Minutes of Exercise per Session: 30 min  Stress: No Stress Concern Present (05/17/2022)   [place]    Feeling of Stress : Not at all  Social Connections: [place] (05/17/2022)   Social Connection and Isolation Panel [NHANES]    Frequency of Communication with Friends and Family:  More than three times a week    Frequency of Social Gatherings with Friends and Family: More than three times a week    Attends Religious Services: 1 to 4 times per year    Active Member of [employer] or Organizations: Yes    Attends Archivist Meetings: Never    Marital Status: Married  Human resources officer Violence: Not At Risk (05/17/2022)   Humiliation, Afraid, Rape, and Kick questionnaire    Fear of Current or Ex-Partner: No    Emotionally Abused: No    Physically Abused: No    Sexually Abused: No    Outpatient Encounter Medications as of 10/28/2022  Medication Sig   acetaminophen (TYLENOL) 500 MG tablet Take 1,000 mg by mouth every 8 (eight) hours as needed for moderate pain.   cetirizine (ZYRTEC) 10 MG tablet Take 10 mg by mouth daily.   Cholecalciferol (VITAMIN D3) 50 MCG (2000 UT) TABS Take 2,000 Units by mouth daily in the afternoon.   citalopram (CELEXA) 20 MG tablet Take 1 tablet (20 mg total) by mouth daily.   lactose free nutrition (BOOST) LIQD Take 237 mLs by mouth daily.   levothyroxine (SYNTHROID) 88 MCG tablet Take 1 tablet (88 mcg total) by mouth daily before breakfast.   Multiple Vitamins-Minerals (CENTRUM SILVER PO) Take 1 tablet by mouth daily in the afternoon.   nilotinib (TASIGNA) 150 MG capsule Take 2 capsules (300 mg total) by mouth every 12 (twelve) hours.   Probiotic Product (PROBIOTIC DAILY PO) Take 2 each by mouth daily in the afternoon.   vitamin B-12 (CYANOCOBALAMIN) 1000 MCG tablet Take 1,000 mcg by mouth daily.   No facility-administered encounter medications on file as of 10/28/2022.    Allergies  Allergen Reactions   Nucynta [Tapentadol Hcl] Swelling   Codeine Nausea Only and Other (See Comments)    Headache   Sulfonamide Derivatives Nausea Only and Other (See Comments)    Headache   Nexium [Esomeprazole Magnesium] Nausea Only   Sulfa Antibiotics Nausea Only and Other (See Comments)   Fluconazole Rash and Other (See Comments)    Redness and  blistering on left thigh    Review of Systems  Constitutional:  Negative for activity change, appetite change, chills, diaphoresis, fatigue, fever and unexpected weight change.  Eyes:  Negative for photophobia and visual disturbance.  Respiratory:  Negative for cough and shortness of breath.   Cardiovascular:  Negative for chest pain, palpitations and leg swelling.  Gastrointestinal:  Positive for abdominal pain (lower abdominal pressure with voiding). Negative for nausea and vomiting.  Genitourinary:  Positive for dysuria. Negative for decreased urine volume, difficulty urinating, flank pain, frequency, hematuria, hesitancy and urgency.  Neurological:  Negative for weakness and headaches.  Psychiatric/Behavioral:  Negative for confusion.   All other systems reviewed and are negative.        Observations/Objective: No vital signs or physical exam, this was a virtual health encounter.  Pt alert and oriented, answers all questions appropriately, and able to speak in full sentences.    Assessment and Plan: Andrea Martinez was seen today for dysuria.  Diagnoses and all orders for this visit:  Dysuria Urinalysis unremarkable. Culture pending. Will treat if warranted. Pt aware if symptoms persist or worsen, she will need to be seen in office.  -     Urinalysis, Routine w reflex microscopic -     Urine Culture     Follow Up Instructions: Return if symptoms worsen or fail to improve.    I discussed the assessment and treatment plan with the patient. The patient was provided an opportunity to ask questions and all were answered. The patient agreed with the plan and demonstrated an understanding of the instructions.   The patient was advised to call back or seek an in-person evaluation if the symptoms worsen or if the condition fails to improve as anticipated.  The above assessment and management plan was discussed with the patient. The patient verbalized understanding of and has agreed to the  management plan. Patient is aware to call the clinic if they develop any new symptoms or if symptoms persist or worsen. Patient is aware when to return to the clinic for a follow-up visit. Patient educated on when it is appropriate to go to the emergency department.    I provided 15 minutes of time during this telephone encounter.   Monia Pouch, FNP-C [facility] [address] [place],  [address] [phone] 10/28/2022

## 2022-10-30 LAB — URINE CULTURE

## 2022-11-02 NOTE — Telephone Encounter (Signed)
Oral Oncology Patient Advocate Encounter   Submitted application for assistance for Tasigna to [employer].   Application submitted via e-fax to [facility] phone number [phone].   I will continue to check the status until final determination.   Berdine Addison, [facility] Patient [place]  [phone] (phone) [phone] (fax) 11/02/2022 11:23 AM

## 2022-11-09 NOTE — Telephone Encounter (Signed)
Oral Oncology Patient Advocate Encounter   Received notification that the application for assistance for Tasigna through [employer] has been approved.   [employer]' phone number [phone].   Effective dates: 01.01.24 through 12.31.24  I have spoken to the patient.  Berdine Addison, [facility] Patient [place]  [phone] (phone) [phone] (fax) 11/09/2022 10:58 AM

## 2022-12-27 ENCOUNTER — Other Ambulatory Visit: Payer: Self-pay | Admitting: "Endocrinology

## 2023-01-20 ENCOUNTER — Inpatient Hospital Stay: Payer: PPO | Attending: Hematology

## 2023-01-20 DIAGNOSIS — D649 Anemia, unspecified: Secondary | ICD-10-CM | POA: Insufficient documentation

## 2023-01-20 DIAGNOSIS — J90 Pleural effusion, not elsewhere classified: Secondary | ICD-10-CM | POA: Insufficient documentation

## 2023-01-20 DIAGNOSIS — I3139 Other pericardial effusion (noninflammatory): Secondary | ICD-10-CM | POA: Insufficient documentation

## 2023-01-20 DIAGNOSIS — C9210 Chronic myeloid leukemia, BCR/ABL-positive, not having achieved remission: Secondary | ICD-10-CM | POA: Insufficient documentation

## 2023-01-20 DIAGNOSIS — Z801 Family history of malignant neoplasm of trachea, bronchus and lung: Secondary | ICD-10-CM | POA: Diagnosis not present

## 2023-01-20 DIAGNOSIS — E039 Hypothyroidism, unspecified: Secondary | ICD-10-CM

## 2023-01-20 DIAGNOSIS — D509 Iron deficiency anemia, unspecified: Secondary | ICD-10-CM

## 2023-01-20 LAB — COMPREHENSIVE METABOLIC PANEL
ALT: 18 U/L (ref 0–44)
AST: 27 U/L (ref 15–41)
Albumin: 4 g/dL (ref 3.5–5.0)
Alkaline Phosphatase: 179 U/L — ABNORMAL HIGH (ref 38–126)
Anion gap: 10 (ref 5–15)
BUN: 23 mg/dL (ref 8–23)
CO2: 22 mmol/L (ref 22–32)
Calcium: 8.9 mg/dL (ref 8.9–10.3)
Chloride: 105 mmol/L (ref 98–111)
Creatinine, Ser: 0.92 mg/dL (ref 0.44–1.00)
GFR, Estimated: >60 mL/min (ref >60)
Glucose, Bld: 112 mg/dL — ABNORMAL HIGH (ref 70–99)
Potassium: 4.7 mmol/L (ref 3.5–5.1)
Sodium: 137 mmol/L (ref 135–145)
Total Bilirubin: 0.9 mg/dL (ref 0.3–1.2)
Total Protein: 7.8 g/dL (ref 6.5–8.1)

## 2023-01-20 LAB — CBC WITH DIFFERENTIAL/PLATELET
Abs Immature Granulocytes: 0.04 10*3/uL (ref 0.00–0.07)
Basophils Absolute: 0 10*3/uL (ref 0.0–0.1)
Basophils Relative: 0 %
Eosinophils Absolute: 0.1 10*3/uL (ref 0.0–0.5)
Eosinophils Relative: 1 %
HCT: 38.1 % (ref 36.0–46.0)
Hemoglobin: 11.9 g/dL — ABNORMAL LOW (ref 12.0–15.0)
Immature Granulocytes: 0 %
Lymphocytes Relative: 12 %
Lymphs Abs: 1.2 10*3/uL (ref 0.7–4.0)
MCH: 27.8 pg (ref 26.0–34.0)
MCHC: 31.2 g/dL (ref 30.0–36.0)
MCV: 89 fL (ref 80.0–100.0)
Monocytes Absolute: 0.6 10*3/uL (ref 0.1–1.0)
Monocytes Relative: 6 %
Neutro Abs: 8.1 10*3/uL — ABNORMAL HIGH (ref 1.7–7.7)
Neutrophils Relative %: 81 %
Platelets: 249 10*3/uL (ref 150–400)
RBC: 4.28 MIL/uL (ref 3.87–5.11)
RDW: 13.1 % (ref 11.5–15.5)
WBC: 10.1 10*3/uL (ref 4.0–10.5)
nRBC: 0 % (ref 0.0–0.2)

## 2023-01-20 LAB — LACTATE DEHYDROGENASE: LDH: 152 U/L (ref 98–192)

## 2023-01-20 LAB — FERRITIN: Ferritin: 56 ng/mL (ref 11–307)

## 2023-01-20 LAB — IRON AND TIBC
Iron: 38 ug/dL (ref 28–170)
Saturation Ratios: 11 % (ref 10.4–31.8)
TIBC: 349 ug/dL (ref 250–450)
UIBC: 311 ug/dL

## 2023-01-27 ENCOUNTER — Inpatient Hospital Stay: Payer: PPO | Admitting: Hematology

## 2023-01-27 LAB — BCR-ABL1, CML/ALL, PCR, QUANT
Interpretation (BCRAL):: POSITIVE — AB
b2a2 transcript: 0.1274 %
b3a2 transcript: 0.0561 %

## 2023-02-02 ENCOUNTER — Inpatient Hospital Stay (HOSPITAL_BASED_OUTPATIENT_CLINIC_OR_DEPARTMENT_OTHER): Payer: PPO | Admitting: Hematology

## 2023-02-02 VITALS — BP 119/58 | HR 75 | Temp 98.0°F | Resp 19 | Ht 65.0 in | Wt 215.5 lb

## 2023-02-02 DIAGNOSIS — D509 Iron deficiency anemia, unspecified: Secondary | ICD-10-CM

## 2023-02-02 DIAGNOSIS — E039 Hypothyroidism, unspecified: Secondary | ICD-10-CM | POA: Diagnosis not present

## 2023-02-02 DIAGNOSIS — C9210 Chronic myeloid leukemia, BCR/ABL-positive, not having achieved remission: Secondary | ICD-10-CM | POA: Diagnosis not present

## 2023-02-02 NOTE — Patient Instructions (Addendum)
[facility]  Discharge Instructions  You were seen and examined today by Dr. Delton Coombes.  Dr. Delton Coombes discussed your most recent lab work which revealed that everything looks okay your Iron is low.   Stop taking your Iron pill. Dr. Delton Coombes is going to get you set up for Iron infusion.  Follow-up as scheduled in 3 months with labs.     Thank you for choosing [facility] to provide your oncology and hematology care.   To afford each patient quality time with our provider, please arrive at least 15 minutes before your scheduled appointment time. You may need to reschedule your appointment if you arrive late (10 or more minutes). Arriving late affects you and other patients whose appointments are after yours.  Also, if you miss three or more appointments without notifying the office, you may be dismissed from the clinic at the provider's discretion.    Again, thank you for choosing [facility].  Our hope is that these requests will decrease the amount of time that you wait before being seen by our physicians.   If you have a lab appointment with the [facility] please come in thru the Main Entrance and check in at the main information desk.           _____________________________________________________________  Should you have questions after your visit to [facility], please contact our office at [phone] and follow the prompts.  Our office hours are 8:00 a.m. to 4:30 p.m. Monday - Thursday and 8:00 a.m. to 2:30 p.m. Friday.  Please note that voicemails left after 4:00 p.m. may not be returned until the following business day.  We are closed weekends and all major holidays.  You do have access to a nurse 24-7, just call the main number to the clinic [phone] and do not press any options, hold on the line and a nurse will answer the phone.    For prescription refill requests, have your pharmacy  contact our office and allow 72 hours.    Masks are optional in the cancer centers. If you would like for your care team to wear a mask while they are taking care of you, please let them know. You may have one support person who is at least 78 years old accompany you for your appointments.

## 2023-02-02 NOTE — Progress Notes (Signed)
Andrea Martinez [address], [place] [address]    Clinic Day:  02/02/2023  Referring physician: Baruch Gouty, FNP  Patient Care Team: Andrea Gouty, FNP as PCP - General (Family Medicine) Andrea Jack, MD as Medical Oncologist (Medical Oncology) Andrea Anger, MD as Consulting Physician (Endocrinology) Andrea Schools, MD as Consulting Physician (Orthopedic Surgery)   ASSESSMENT & PLAN:   Assessment: 1.  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 -Left pleural effusion and pericardial effusion requiring pericardial window on 10/25/2016.  Fluid cytology negative. -Started back on Nilotinib 300 mg every 12 hours on 12/06/2016.   2.  Daytime sweats: -He had a long history of daytime sweats.  She had extensive work-up including a CT scan CAP, which showed no evidence of malignancy.  Hepatic morphology suspicious for cirrhosis.  Plan: 1.  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 - She denies any major side effects from it. - Reviewed labs today which showed normal LFTs.  CBC was grossly normal with mildly elevated neutrophil count of 8.1. - BCR/ABL by quantitative PCR showed positive B2 A2 and b3 A2 transcript for the first time in more than a year. - She does not report missing doses. - Recommend continuing Nilotinib 300 mg twice daily.  RTC 3 months for follow-up.  Will check BCR-ABL by quantitative PCR and kinase domain mutation testing. - If there is true progression, consider switching therapy.   2.  Normocytic anemia: - She is taking iron tablet daily.  She reports severe fatigue. - Ferritin is 56, down from 63.  Hemoglobin is 11.9. - Due to severe fatigue, I have recommended Feraheme x 2.  We discussed side effects in detail. - Will plan to repeat ferritin and iron panel in 3 months.     Orders Placed This Encounter  Procedures   BCR-ABL1, CML/ALL, PCR, QUANT     Standing Status:   Future    Standing Expiration Date:   02/03/2024   BCR-ABL1 Kinase domain mutation analysis    Standing Status:   Future    Standing Expiration Date:   02/03/2024   CBC with Differential/Platelet    Standing Status:   Future    Standing Expiration Date:   02/03/2024    Order Specific Question:   Release to patient    Answer:   Immediate   Comprehensive metabolic panel    Standing Status:   Future    Standing Expiration Date:   02/03/2024    Order Specific Question:   Release to patient    Answer:   Immediate   Lactate dehydrogenase    Standing Status:   Future    Standing Expiration Date:   02/03/2024    Order Specific Question:   Release to patient    Answer:   Immediate   Ferritin    Standing Status:   Future    Standing Expiration Date:   02/03/2024    Order Specific Question:   Release to patient    Answer:   Immediate   Iron and TIBC    Standing Status:   Future    Standing Expiration Date:   02/03/2024    Order Specific Question:   Release to patient    Answer:   Immediate      Andrea Martinez as a scribe for Andrea Jack, MD.,have documented all relevant documentation on the behalf of Andrea Jack, MD,as  directed by  Andrea Jack, MD while in the presence of Andrea Jack, MD.   I, Andrea Jack MD, have reviewed the above documentation for accuracy and completeness, and I agree with the above.   Andrea Jack, MD   2/21/20246:27 PM  CHIEF COMPLAINT:   Diagnosis: CML    Cancer Staging  No matching staging information was found for the patient.   Prior Therapy: None  Current Therapy:   Tasigna 300 mg Q 12 H    HISTORY OF PRESENT ILLNESS:   Oncology History  CML (chronic myeloid leukemia) ([facility])  11/21/2015 Initial Diagnosis   Chronic myelogenous leukemia (CML), BCR-ABL1-positive ([place])   11/21/2015 Miscellaneous   Seen by Dr. Florene [facility]   11/21/2015 -  Chemotherapy   Hydrea 1000 mg  and Allopurinol 300 mg daily (due to high uric acid level)   11/21/2015 Tumor Marker   BCR P210 Ratio of Fusion to Control, Blood 0.8941    P210 IS % Ratio, Blood 85.834%     01/02/2016 - 10/23/2016 Chemotherapy   Tasigna   01/08/2016 Miscellaneous   EKG- QT/QTc: 382/440 ms   01/16/2016 Miscellaneous   EKG- QT/QTc: 364/419   03/30/2016 Tumor Marker   b2a2 transcript: 0.317 b3a2 transcript: 0.194   10/23/2016 - 11/05/2016 Hospital Admission   large Left Pleural Effusion and Pericardial Effusion that is now s/p VATS with Drainage, and Drainage of Pericardial Fluid with Window and Biopsy of Pericardium   12/06/2016 -  Chemotherapy   Tasigna restarted    05/24/2017 Adverse Reaction   Prolonged QTc on EKG; likely d/t chemo. Tasigna ON HOLD until next visit.    06/10/2017 -  Chemotherapy   Resumed Tasigna   07/13/2017 Miscellaneous   Evaluated by Cardiology for prolonged QTc. Per Dr. Bronson Ing, patient's QT intervals are not prolonged to the point of potentially promoting a ventricular arrhythmia. He thinks she can continue on current medical therapy for her CML with serial ECG monitoring.   12/27/2017 Miscellaneous   EKG: QT/QTc 398/435      INTERVAL HISTORY:   Andrea Martinez is a 77 y.o. female presenting to clinic today for follow up of CML . She was last seen by me on 01/12/2022.  Today, she states that she is doing okay overall. Her appetite level is at 40%. Her energy level is at 0%. She has 8/10 back and hip pain. She is currently tired but does not understand why she is tired. She hasn't missed a dose of her medication since it was prescribed. She sweats a lot currently as a side effect of the medication. She takes her Leukemia medication on an empty stomach. She states she had a boost of energy after iron infusion.  PAST MEDICAL HISTORY:   Past Medical History: Past Medical History:  Diagnosis Date   Anxiety    denies   Arthritis    Chronic myelogenous leukemia (CML),  BCR-ABL1-positive ([place]) 11/20/2015   Gastric ulcer 2011   EGD, 5/11   History of peptic ulcer disease    Hypothyroidism    not on meds, followed by Dr. Elyse Martinez   Lipoma    left upper arm   Mixed hyperlipidemia    Osteoporosis    Pericardial effusion    a. HCAP complicated by pericardial effusion requiring pericardial window 10/2016 and large L pleural effusion requring VATS.   Pleural effusion    a. s/p VATS 2017.   Pneumonia 10/2016   PONV (postoperative nausea and vomiting)    history of   Prolonged  QT interval    Urine incontinence    UTI (lower urinary tract infection)    frequent   Vitamin D deficiency     Surgical History: Past Surgical History:  Procedure Laterality Date   BIOPSY  03/20/2022   Procedure: BIOPSY;  Surgeon: Eloise Harman, DO;  [facility];  Service: Endoscopy;;  gastric lesion vs. gastric ulcer   BIOPSY  06/28/2022   Procedure: BIOPSY;  Surgeon: Eloise Harman, DO;  [facility];  Service: Endoscopy;;   BRAVO [place] STUDY  12/04/2012   Procedure: BRAVO [place];  Surgeon: [name], MD;  [facility];  Service: Endoscopy;  Laterality: N/A;   CHOLECYSTECTOMY N/A 01/03/2014   Procedure: LAPAROSCOPIC CHOLECYSTECTOMY WITH INTRAOPERATIVE CHOLANGIOGRAM;  Surgeon: Joyice Faster. Cornett, MD;  Location: [facility];  Service: General;  Laterality: N/A;   COLONOSCOPY  01/2004   [facility], few small tics   ESOPHAGOGASTRODUODENOSCOPY  02/2010   gastric ulcers   ESOPHAGOGASTRODUODENOSCOPY  12/04/2012   [id number] gastritis (inflammation) was found in the gastric antrum; multiple biopsies The duodenal mucosa showed no abnormalities in the bulb and second portion of the duodenum   ESOPHAGOGASTRODUODENOSCOPY (EGD) WITH PROPOFOL N/A 03/20/2022   Procedure: ESOPHAGOGASTRODUODENOSCOPY (EGD) WITH PROPOFOL;  Surgeon: Eloise Harman, DO;  [facility];  Service: Endoscopy;  Laterality: N/A;    ESOPHAGOGASTRODUODENOSCOPY (EGD) WITH PROPOFOL N/A 06/28/2022   Procedure: ESOPHAGOGASTRODUODENOSCOPY (EGD) WITH PROPOFOL;  Surgeon: Eloise Harman, DO;  [facility];  Service: Endoscopy;  Laterality: N/A;  800   JOINT REPLACEMENT Right 10/22/2019   KYPHOPLASTY N/A 02/14/2019   Procedure: KYPHOPLASTY T12 and L1;  Surgeon: Andrea Schools, MD;  Location: [facility];  Service: Orthopedics;  Laterality: N/A;  120 mins   LIPOMA EXCISION  08/02/2011   left shoulder   NOSE SURGERY     PARTIAL HYSTERECTOMY     vaginal at age 19 years of age   TOE 14 Left 1962   lt great toe   TOTAL HIP ARTHROPLASTY Right 11/18/2021   Procedure: TOTAL HIP ARTHROPLASTY ANTERIOR APPROACH;  Surgeon: Gaynelle Arabian, MD;  [facility];  Service: Orthopedics;  Laterality: Right;   TOTAL KNEE ARTHROPLASTY Right 10/22/2019   Procedure: TOTAL KNEE ARTHROPLASTY;  Surgeon: Gaynelle Arabian, MD;  [facility];  Service: Orthopedics;  Laterality: Right;  36mn   TOTAL KNEE ARTHROPLASTY Left 03/03/2020   Procedure: TOTAL KNEE ARTHROPLASTY;  Surgeon: AGaynelle Arabian MD;  [facility];  Service: Orthopedics;  Laterality: Left;  539m   TRANSFORAMINAL LUMBAR INTERBODY FUSION (TLIF) WITH PEDICLE SCREW FIXATION 1 LEVEL N/A 01/28/2021   Procedure: TRANSFORAMINAL LUMBAR INTERBODY FUSION (TLIF) LUMBAR FIVE-SACRAL ONE;  Surgeon: BrMelina SchoolsMD;  [facility] Service: Orthopedics;  Laterality: N/A;  4 hrs   VIDEO ASSISTED THORACOSCOPY (VATS)/EMPYEMA Left 10/25/2016   Procedure: VIDEO ASSISTED THORACOSCOPY (VATS), BRONCH,DRAINAGE OF PLEURAL EFFUSION,PERICARDIAL WINDOW WITH DRAINAGE OF PERICARDIAL FLUID, TEE;  Surgeon: StMelrose NakayamaMD;  [facility] Service: Thoracic;  Laterality: Left;   VIDEO BRONCHOSCOPY N/A 10/25/2016   Procedure: VIDEO BRONCHOSCOPY;  Surgeon: StMelrose NakayamaMD;  [facility] Service: Thoracic;  Laterality: N/A;    Social History: Social History   Socioeconomic  History   Marital status: Married    Spouse name: GeIona Beard Number of children: 2   Years of education: 12th grade   Highest education level: Not on file  Occupational History  Occupation: retired    Fish farm manager: Transport planner: RETIRED  Tobacco Use   Smoking status: Never   Smokeless tobacco: Never  Vaping Use   Vaping Use: Never used  Substance and Sexual Activity   Alcohol use: No    Alcohol/week: 0.0 standard drinks of alcohol   Drug use: No   Sexual activity: Yes    Partners: Male    Birth control/protection: None  Other Topics Concern   Not on file  Social History Narrative   1 son, 1 daughter.   2 granddaughters.   Retired [employer].    Social Determinants of Health   Financial Resource Strain: Low Risk  (05/17/2022)   Overall Financial Resource Strain (CARDIA)    Difficulty of Paying Living Expenses: Not hard at all  Food Insecurity: No Food Insecurity (05/17/2022)   Hunger Vital Sign    Worried About Running Out of Food in the Last Year: Never true    Ran Out of Food in the Last Year: Never true  Transportation Needs: No Transportation Needs (05/17/2022)   PRAPARE - Hydrologist (Medical): No    Lack of Transportation (Non-Medical): No  Physical Activity: Sufficiently Active (05/17/2022)   Exercise Vital Sign    Days of Exercise per Week: 5 days    Minutes of Exercise per Session: 30 min  Stress: No Stress Concern Present (05/17/2022)   [place]    Feeling of Stress : Not at all  Social Connections: Socially Integrated (05/17/2022)   Social Connection and Isolation Panel [NHANES]    Frequency of Communication with Friends and Family: More than three times a week    Frequency of Social Gatherings with Friends and Family: More than three times a week    Attends Religious Services: 1 to 4 times per year    Active Member of [employer] or Organizations: Yes    Attends Theatre manager Meetings: Never    Marital Status: Married  Human resources officer Violence: Not At Risk (05/17/2022)   Humiliation, Afraid, Rape, and Kick questionnaire    Fear of Current or Ex-Partner: No    Emotionally Abused: No    Physically Abused: No    Sexually Abused: No    Family History: Family History  Problem Relation Age of Onset   Lung cancer Father    COPD Mother    Stroke Mother    Thyroid disease Mother    Colon cancer Neg Hx    Anesthesia problems Neg Hx    Hypotension Neg Hx    Malignant hyperthermia Neg Hx    Pseudochol deficiency Neg Hx     Current Medications:  Current Outpatient Medications:    acetaminophen (TYLENOL) 500 MG tablet, Take 1,000 mg by mouth every 8 (eight) hours as needed for moderate pain., Disp: , Rfl:    cetirizine (ZYRTEC) 10 MG tablet, Take 10 mg by mouth daily., Disp: , Rfl:    Cholecalciferol (VITAMIN D3) 50 MCG (2000 UT) TABS, Take 2,000 Units by mouth daily in the afternoon., Disp: , Rfl:    citalopram (CELEXA) 20 MG tablet, Take 1 tablet (20 mg total) by mouth daily., Disp: 90 tablet, Rfl: 1   lactose free nutrition (BOOST) LIQD, Take 237 mLs by mouth daily., Disp: , Rfl:    levothyroxine (SYNTHROID) 88 MCG tablet, TAKE 1 TABLET BY MOUTH DAILY BEFORE BREAKFAST., Disp: 90 tablet, Rfl: 1   Multiple Vitamins-Minerals (CENTRUM SILVER PO),  Take 1 tablet by mouth daily in the afternoon., Disp: , Rfl:    nilotinib (TASIGNA) 150 MG capsule, Take 2 capsules (300 mg total) by mouth every 12 (twelve) hours., Disp: 120 capsule, Rfl: 12   Probiotic Product (PROBIOTIC DAILY PO), Take 2 each by mouth daily in the afternoon., Disp: , Rfl:    vitamin B-12 (CYANOCOBALAMIN) 1000 MCG tablet, Take 1,000 mcg by mouth daily., Disp: , Rfl:    Allergies: Allergies  Allergen Reactions   Nucynta [Tapentadol Hcl] Swelling   Codeine Nausea Only and Other (See Comments)    Headache   Sulfonamide Derivatives Nausea Only and Other (See Comments)    Headache    Nexium [Esomeprazole Magnesium] Nausea Only   Sulfa Antibiotics Nausea Only and Other (See Comments)   Fluconazole Rash and Other (See Comments)    Redness and blistering on left thigh    REVIEW OF SYSTEMS:   Review of Systems  Constitutional:  Negative for chills, fatigue and fever.  HENT:   Negative for lump/mass, mouth sores, nosebleeds, sore throat and trouble swallowing.   Eyes:  Negative for eye problems.  Respiratory:  Negative for cough and shortness of breath.   Cardiovascular:  Negative for chest pain, leg swelling and palpitations.  Gastrointestinal:  Negative for abdominal pain, constipation, diarrhea, nausea and vomiting.  Genitourinary:  Positive for difficulty urinating. Negative for bladder incontinence, dysuria, frequency, hematuria and nocturia.   Musculoskeletal:  Negative for arthralgias, back pain, flank pain, myalgias and neck pain.  Skin:  Negative for itching and rash.  Neurological:  Negative for dizziness, headaches and numbness.  Hematological:  Does not bruise/bleed easily.  Psychiatric/Behavioral:  Positive for sleep disturbance. Negative for depression and suicidal ideas. The patient is not nervous/anxious.   All other systems reviewed and are negative.    VITALS:   Blood pressure (!) 119/58, pulse 75, temperature 98 F (36.7 C), temperature source Oral, resp. rate 19, height 5' 5"$  (1.651 m), weight 215 lb 8 oz (97.8 kg), SpO2 100 %.  Wt Readings from Last 3 Encounters:  02/02/23 215 lb 8 oz (97.8 kg)  10/22/22 209 lb 12.8 oz (95.2 kg)  08/11/22 205 lb 12.8 oz (93.4 kg)    Body mass index is 35.86 kg/m.  Performance status (ECOG): 1 - Symptomatic but completely ambulatory  PHYSICAL EXAM:   Physical Exam Vitals and nursing note reviewed. Exam conducted with a chaperone present.  Constitutional:      Appearance: Normal appearance.  Cardiovascular:     Rate and Rhythm: Normal rate and regular rhythm.     Pulses: Normal pulses.     Heart  sounds: Normal heart sounds.  Pulmonary:     Effort: Pulmonary effort is normal.     Breath sounds: Normal breath sounds.  Abdominal:     Palpations: Abdomen is soft. There is no hepatomegaly, splenomegaly or mass.     Tenderness: There is no abdominal tenderness.  Musculoskeletal:     Right lower leg: No edema.     Left lower leg: No edema.  Lymphadenopathy:     Cervical: No cervical adenopathy.     Right cervical: No superficial, deep or posterior cervical adenopathy.    Left cervical: No superficial, deep or posterior cervical adenopathy.     Upper Body:     Right upper body: No supraclavicular or axillary adenopathy.     Left upper body: No supraclavicular or axillary adenopathy.  Neurological:     General: No focal deficit present.  Mental Status: She is alert and oriented to person, place, and time.  Psychiatric:        Mood and Affect: Mood normal.        Behavior: Behavior normal.     LABS:      Latest Ref Rng & Units 01/20/2023    1:39 PM 10/01/2022   12:08 PM 07/13/2022    9:56 AM  CBC  WBC 4.0 - 10.5 K/uL 10.1  8.5  10.2   Hemoglobin 12.0 - 15.0 g/dL 11.9  11.6  11.2   Hematocrit 36.0 - 46.0 % 38.1  37.1  36.6   Platelets 150 - 400 K/uL 249  267  297       Latest Ref Rng & Units 01/20/2023    1:39 PM 07/13/2022    9:56 AM 04/28/2022    2:30 PM  CMP  Glucose 70 - 99 mg/dL 112  105  94   BUN 8 - 23 mg/dL 23  20  24   $ Creatinine 0.44 - 1.00 mg/dL 0.92  0.92  1.00   Sodium 135 - 145 mmol/L 137  139  141   Potassium 3.5 - 5.1 mmol/L 4.7  4.6  5.1   Chloride 98 - 111 mmol/L 105  108  101   CO2 22 - 32 mmol/L 22  25  23   $ Calcium 8.9 - 10.3 mg/dL 8.9  9.0  9.6   Total Protein 6.5 - 8.1 g/dL 7.8  7.6  7.4   Total Bilirubin 0.3 - 1.2 mg/dL 0.9  1.0  0.4   Alkaline Phos 38 - 126 U/L 179  156  205   AST 15 - 41 U/L 27  26  31   $ ALT 0 - 44 U/L 18  17  20      $ No results found for: "CEA1", "CEA" / No results found for: "CEA1", "CEA" No results found for:  "PSA1" No results found for: "[id number]" No results found for: "CAN125"  No results found for: "TOTALPROTELP", "ALBUMINELP", "A1GS", "A2GS", "BETS", "BETA2SER", "GAMS", "MSPIKE", "SPEI" Lab Results  Component Value Date   TIBC 349 01/20/2023   TIBC 333 10/01/2022   TIBC 361 07/13/2022   FERRITIN 56 01/20/2023   FERRITIN 63 10/01/2022   FERRITIN 38 07/13/2022   IRONPCTSAT 11 01/20/2023   IRONPCTSAT 13 10/01/2022   IRONPCTSAT 9 (L) 07/13/2022   Lab Results  Component Value Date   LDH 152 01/20/2023   LDH 141 05/22/2020   LDH 121 04/24/2019     STUDIES:   No results found.

## 2023-02-03 LAB — TSH: TSH: 0.819 u[IU]/mL (ref 0.450–4.500)

## 2023-02-03 LAB — T4, FREE: Free T4: 1.29 ng/dL (ref 0.82–1.77)

## 2023-02-08 ENCOUNTER — Other Ambulatory Visit: Payer: Self-pay | Admitting: Hematology

## 2023-02-08 ENCOUNTER — Inpatient Hospital Stay: Payer: PPO

## 2023-02-08 VITALS — BP 126/51 | HR 69 | Temp 97.3°F | Resp 18

## 2023-02-08 DIAGNOSIS — D509 Iron deficiency anemia, unspecified: Secondary | ICD-10-CM

## 2023-02-08 DIAGNOSIS — C9210 Chronic myeloid leukemia, BCR/ABL-positive, not having achieved remission: Secondary | ICD-10-CM | POA: Diagnosis not present

## 2023-02-08 MED ORDER — SODIUM CHLORIDE 0.9 % IV SOLN: Freq: Once | INTRAVENOUS | Status: AC

## 2023-02-08 MED ORDER — SODIUM CHLORIDE 0.9 % IV SOLN
510.0000 mg | Freq: Once | INTRAVENOUS | Status: AC
  Administered 2023-02-08: 510 mg via INTRAVENOUS
  Filled 2023-02-08: qty 510

## 2023-02-08 MED ORDER — SODIUM CHLORIDE 0.9 % IV SOLN: Freq: Once | INTRAVENOUS | Status: DC

## 2023-02-08 NOTE — Patient Instructions (Signed)
[facility]  Discharge Instructions: Thank you for choosing [facility] to provide your oncology and hematology care.  If you have a lab appointment with the [facility], please come in thru the Main Entrance and check in at the main information desk.  Wear comfortable clothing and clothing appropriate for easy access to any Portacath or PICC line.   We strive to give you quality time with your provider. You may need to reschedule your appointment if you arrive late (15 or more minutes).  Arriving late affects you and other patients whose appointments are after yours.  Also, if you miss three or more appointments without notifying the office, you may be dismissed from the clinic at the provider's discretion.      For prescription refill requests, have your pharmacy contact our office and allow 72 hours for refills to be completed.    Ferumoxytol Injection What is this medication? FERUMOXYTOL (FER ue MOX i tol) treats low levels of iron in your body (iron deficiency anemia). Iron is a mineral that plays an important role in making red blood cells, which carry oxygen from your lungs to the rest of your body. This medicine may be used for other purposes; ask your health care provider or pharmacist if you have questions. COMMON BRAND NAME(S): Feraheme What should I tell my care team before I take this medication? They need to know if you have any of these conditions: Anemia not caused by low iron levels High levels of iron in the blood Magnetic resonance imaging (MRI) test scheduled An unusual or allergic reaction to iron, other medications, foods, dyes, or preservatives Pregnant or trying to get pregnant Breastfeeding How should I use this medication? This medication is injected into a vein. It is given by your care team in a hospital or clinic setting. Talk to your care team the use of this medication in children. Special care may be needed. Overdosage:  If you think you have taken too much of this medicine contact a poison control center or emergency room at once. NOTE: This medicine is only for you. Do not share this medicine with others. What if I miss a dose? It is important not to miss your dose. Call your care team if you are unable to keep an appointment. What may interact with this medication? Other iron products This list may not describe all possible interactions. Give your health care provider a list of all the medicines, herbs, non-prescription drugs, or dietary supplements you use. Also tell them if you smoke, drink alcohol, or use illegal drugs. Some items may interact with your medicine. What should I watch for while using this medication? Visit your care team regularly. Tell your care team if your symptoms do not start to get better or if they get worse. You may need blood work done while you are taking this medication. You may need to follow a special diet. Talk to your care team. Foods that contain iron include: whole grains/cereals, dried fruits, beans, or peas, leafy green vegetables, and organ meats (liver, kidney). What side effects may I notice from receiving this medication? Side effects that you should report to your care team as soon as possible: Allergic reactions--skin rash, itching, hives, swelling of the face, lips, tongue, or throat Low blood pressure--dizziness, feeling faint or lightheaded, blurry vision Shortness of breath Side effects that usually do not require medical attention (report to your care team if they continue or are bothersome): Flushing Headache  Joint pain Muscle pain Nausea Pain, redness, or irritation at injection site This list may not describe all possible side effects. Call your doctor for medical advice about side effects. You may report side effects to FDA at 1-800-FDA-1088. Where should I keep my medication? This medication is given in a hospital or clinic and will not be stored at  home. NOTE: This sheet is a summary. It may not cover all possible information. If you have questions about this medicine, talk to your doctor, pharmacist, or health care provider.  2023 Elsevier/Gold Standard (2021-04-22 00:00:00)    To help prevent nausea and vomiting after your treatment, we encourage you to take your nausea medication as directed.  BELOW ARE SYMPTOMS THAT SHOULD BE REPORTED IMMEDIATELY: *FEVER GREATER THAN 100.4 F (38 C) OR HIGHER *CHILLS OR SWEATING *NAUSEA AND VOMITING THAT IS NOT CONTROLLED WITH YOUR NAUSEA MEDICATION *UNUSUAL SHORTNESS OF BREATH *UNUSUAL BRUISING OR BLEEDING *URINARY PROBLEMS (pain or burning when urinating, or frequent urination) *BOWEL PROBLEMS (unusual diarrhea, constipation, pain near the anus) TENDERNESS IN MOUTH AND THROAT WITH OR WITHOUT PRESENCE OF ULCERS (sore throat, sores in mouth, or a toothache) UNUSUAL RASH, SWELLING OR PAIN  UNUSUAL VAGINAL DISCHARGE OR ITCHING   Items with * indicate a potential emergency and should be followed up as soon as possible or go to the Emergency Department if any problems should occur.  Please show the CHEMOTHERAPY ALERT CARD or IMMUNOTHERAPY ALERT CARD at check-in to the Emergency Department and triage nurse.  Should you have questions after your visit or need to cancel or reschedule your appointment, please contact [facility] [phone]  and follow the prompts.  Office hours are 8:00 a.m. to 4:30 p.m. Monday - Friday. Please note that voicemails left after 4:00 p.m. may not be returned until the following business day.  We are closed weekends and major holidays. You have access to a nurse at all times for urgent questions. Please call the main number to the clinic [phone] and follow the prompts.  For any non-urgent questions, you may also contact your provider using MyChart. We now offer e-Visits for anyone 42 and older to request care online for non-urgent symptoms. For  details visit [email or web address].   Also download the MyChart app! Go to the app store, search "MyChart", open the app, select [facility], and log in with your MyChart username and password.

## 2023-02-08 NOTE — Progress Notes (Signed)
Patient presents today for iron infusion.  Patient is in satisfactory condition with no new complaints voiced.  Vital signs are stable.  IV placed in left AC.  IV flushed well with good blood return noted.  We will proceed with infusion per MD orders.   Patient tolerated treatment well with no complaints voiced.  Patient left ambulatory with husband in stable condition.  Vital signs stable at discharge.  Follow up as scheduled.

## 2023-02-10 ENCOUNTER — Encounter: Payer: Self-pay | Admitting: "Endocrinology

## 2023-02-10 ENCOUNTER — Ambulatory Visit: Payer: PPO | Admitting: "Endocrinology

## 2023-02-10 VITALS — BP 120/60 | HR 76 | Ht 65.0 in | Wt 213.8 lb

## 2023-02-10 DIAGNOSIS — E782 Mixed hyperlipidemia: Secondary | ICD-10-CM

## 2023-02-10 DIAGNOSIS — E039 Hypothyroidism, unspecified: Secondary | ICD-10-CM

## 2023-02-10 NOTE — Patient Instructions (Signed)

## 2023-02-10 NOTE — Progress Notes (Signed)
02/10/2023     Endocrinology follow-up note   Subjective:    Patient ID: Andrea Martinez, female    DOB: Nov 15, 1946, PCP Baruch Gouty, FNP   Past Medical History:  Diagnosis Date   Anxiety    denies   Arthritis    Chronic myelogenous leukemia (CML), BCR-ABL1-positive ([place]) 11/20/2015   Gastric ulcer 2011   EGD, 5/11   History of peptic ulcer disease    Hypothyroidism    not on meds, followed by Dr. Elyse Hsu   Lipoma    left upper arm   Mixed hyperlipidemia    Osteoporosis    Pericardial effusion    a. HCAP complicated by pericardial effusion requiring pericardial window 10/2016 and large L pleural effusion requring VATS.   Pleural effusion    a. s/p VATS 2017.   Pneumonia 10/2016   PONV (postoperative nausea and vomiting)    history of   Prolonged QT interval    Urine incontinence    UTI (lower urinary tract infection)    frequent   Vitamin D deficiency    Past Surgical History:  Procedure Laterality Date   BIOPSY  03/20/2022   Procedure: BIOPSY;  Surgeon: Eloise Harman, DO;  [facility];  Service: Endoscopy;;  gastric lesion vs. gastric ulcer   BIOPSY  06/28/2022   Procedure: BIOPSY;  Surgeon: Eloise Harman, DO;  [facility];  Service: Endoscopy;;   BRAVO [place] STUDY  12/04/2012   Procedure: BRAVO [place];  Surgeon: [name], MD;  [facility];  Service: Endoscopy;  Laterality: N/A;   CHOLECYSTECTOMY N/A 01/03/2014   Procedure: LAPAROSCOPIC CHOLECYSTECTOMY WITH INTRAOPERATIVE CHOLANGIOGRAM;  Surgeon: Joyice Faster. Cornett, MD;  Location: [facility];  Service: General;  Laterality: N/A;   COLONOSCOPY  01/2004   [facility], few small tics   ESOPHAGOGASTRODUODENOSCOPY  02/2010   gastric ulcers   ESOPHAGOGASTRODUODENOSCOPY  12/04/2012   [id number] gastritis (inflammation) was found in the gastric antrum; multiple biopsies The duodenal mucosa showed no abnormalities in the bulb and second portion of the duodenum    ESOPHAGOGASTRODUODENOSCOPY (EGD) WITH PROPOFOL N/A 03/20/2022   Procedure: ESOPHAGOGASTRODUODENOSCOPY (EGD) WITH PROPOFOL;  Surgeon: Eloise Harman, DO;  [facility];  Service: Endoscopy;  Laterality: N/A;   ESOPHAGOGASTRODUODENOSCOPY (EGD) WITH PROPOFOL N/A 06/28/2022   Procedure: ESOPHAGOGASTRODUODENOSCOPY (EGD) WITH PROPOFOL;  Surgeon: Eloise Harman, DO;  [facility];  Service: Endoscopy;  Laterality: N/A;  800   JOINT REPLACEMENT Right 10/22/2019   KYPHOPLASTY N/A 02/14/2019   Procedure: KYPHOPLASTY T12 and L1;  Surgeon: Melina Schools, MD;  Location: [place];  Service: Orthopedics;  Laterality: N/A;  120 mins   LIPOMA EXCISION  08/02/2011   left shoulder   NOSE SURGERY     PARTIAL HYSTERECTOMY     vaginal at age 77 years of age   TOE 54 Left 1962   lt great toe   TOTAL HIP ARTHROPLASTY Right 11/18/2021   Procedure: TOTAL HIP ARTHROPLASTY ANTERIOR APPROACH;  Surgeon: Gaynelle Arabian, MD;  [facility];  Service: Orthopedics;  Laterality: Right;   TOTAL KNEE ARTHROPLASTY Right 10/22/2019   Procedure: TOTAL KNEE ARTHROPLASTY;  Surgeon: Gaynelle Arabian, MD;  [facility];  Service: Orthopedics;  Laterality: Right;  88mn   TOTAL KNEE ARTHROPLASTY Left 03/03/2020   Procedure: TOTAL KNEE ARTHROPLASTY;  Surgeon: AGaynelle Arabian MD;  [facility];  Service: Orthopedics;  Laterality: Left;  532m   TRANSFORAMINAL  LUMBAR INTERBODY FUSION (TLIF) WITH PEDICLE SCREW FIXATION 1 LEVEL N/A 01/28/2021   Procedure: TRANSFORAMINAL LUMBAR INTERBODY FUSION (TLIF) LUMBAR FIVE-SACRAL ONE;  Surgeon: Melina Schools, MD;  Location: [place];  Service: Orthopedics;  Laterality: N/A;  4 hrs   VIDEO ASSISTED THORACOSCOPY (VATS)/EMPYEMA Left 10/25/2016   Procedure: VIDEO ASSISTED THORACOSCOPY (VATS), BRONCH,DRAINAGE OF PLEURAL EFFUSION,PERICARDIAL WINDOW WITH DRAINAGE OF PERICARDIAL FLUID, TEE;  Surgeon: [name], MD;  Location: [facility];  Service: Thoracic;  Laterality:  Left;   VIDEO BRONCHOSCOPY N/A 10/25/2016   Procedure: VIDEO BRONCHOSCOPY;  Surgeon: [name], MD;  Location: [place];  Service: Thoracic;  Laterality: N/A;   Social History   Socioeconomic History   Marital status: Married    Spouse name: [name]   Number of children: 2   Years of education: 12th grade   Highest education level: Not on file  Occupational History   Occupation: retired    Fish farm manager: UENPLOYED    Employer: RETIRED  Tobacco Use   Smoking status: Never   Smokeless tobacco: Never  Vaping Use   Vaping Use: Never used  Substance and Sexual Activity   Alcohol use: No    Alcohol/week: 0.0 standard drinks of alcohol   Drug use: No   Sexual activity: Yes    Partners: Male    Birth control/protection: None  Other Topics Concern   Not on file  Social History Narrative   1 son, 1 daughter.   2 granddaughters.   Retired [employer].    Social Determinants of Health   Financial Resource Strain: Low Risk  (05/17/2022)   Overall Financial Resource Strain (CARDIA)    Difficulty of Paying Living Expenses: Not hard at all  Food Insecurity: No Food Insecurity (05/17/2022)   Hunger Vital Sign    Worried About Running Out of Food in the Last Year: Never true    Ran Out of Food in the Last Year: Never true  Transportation Needs: No Transportation Needs (05/17/2022)   PRAPARE - Hydrologist (Medical): No    Lack of Transportation (Non-Medical): No  Physical Activity: Sufficiently Active (05/17/2022)   Exercise Vital Sign    Days of Exercise per Week: 5 days    Minutes of Exercise per Session: 30 min  Stress: No Stress Concern Present (05/17/2022)   [place]    Feeling of Stress : Not at all  Social Connections: [place] (05/17/2022)   Social Connection and Isolation Panel [NHANES]    Frequency of Communication with Friends and Family: More than three times a week    Frequency  of Social Gatherings with Friends and Family: More than three times a week    Attends Religious Services: 1 to 4 times per year    Active Member of [employer] or Organizations: Yes    Attends Archivist Meetings: Never    Marital Status: Married   Outpatient Encounter Medications as of 02/10/2023  Medication Sig   acetaminophen (TYLENOL) 500 MG tablet Take 1,000 mg by mouth every 8 (eight) hours as needed for moderate pain.   cetirizine (ZYRTEC) 10 MG tablet Take 10 mg by mouth daily.   Cholecalciferol (VITAMIN D3) 50 MCG (2000 UT) TABS Take 2,000 Units by mouth daily in the afternoon.   citalopram (CELEXA) 20 MG tablet Take 1 tablet (20 mg total) by mouth daily.   lactose free nutrition (BOOST) LIQD Take 237 mLs by mouth daily.   levothyroxine (  SYNTHROID) 88 MCG tablet TAKE 1 TABLET BY MOUTH DAILY BEFORE BREAKFAST.   Multiple Vitamins-Minerals (CENTRUM SILVER PO) Take 1 tablet by mouth daily in the afternoon.   nilotinib (TASIGNA) 150 MG capsule Take 2 capsules (300 mg total) by mouth every 12 (twelve) hours.   Probiotic Product (PROBIOTIC DAILY PO) Take 2 each by mouth daily in the afternoon.   vitamin B-12 (CYANOCOBALAMIN) 1000 MCG tablet Take 1,000 mcg by mouth daily.   No facility-administered encounter medications on file as of 02/10/2023.   ALLERGIES: Allergies  Allergen Reactions   Nucynta [Tapentadol Hcl] Swelling   Codeine Nausea Only and Other (See Comments)    Headache   Sulfonamide Derivatives Nausea Only and Other (See Comments)    Headache   Nexium [Esomeprazole Magnesium] Nausea Only   Sulfa Antibiotics Nausea Only and Other (See Comments)   Fluconazole Rash and Other (See Comments)    Redness and blistering on left thigh    VACCINATION STATUS: Immunization History  Administered Date(s) Administered   Fluad Quad(high Dose 65+) 09/11/2020, 09/24/2021   Influenza,inj,Quad PF,6+ Mos 09/13/2017   Influenza-Unspecified 09/24/2015, 11/24/2017   Moderna  Sars-Covid-2 Vaccination 04/02/2020, 04/30/2020, 01/07/2021   Pneumococcal Conjugate-13 10/09/2014   Pneumococcal Polysaccharide-23 10/09/2014    HPI  77 year old female patient with medical history as above.   She is being seen in follow-up for hypothyroidism.  Since January 2024 her levothyroxine was lowered to 88 mcg p.o. daily before breakfast due to evidence of over replacement.  She denies chest pain, shortness of breath. She denies palpitations, tremors, nor heat intolerance.   She is still on nilotinib for CML.    -She has family history of thyroid problem involving goiter which required thyroidectomy in her mother who took thyroid hormone replacement subsequently. -She denies dysphagia, shortness of breath, voice change. -She is compliant to her medications.  She also has significant dyslipidemia, not on treatment. -She has uncontrolled dyslipidemia, not on statins.  Review of Systems Limited as above.  Objective:    BP 120/60   Pulse 76   Ht '5\' 5"'$  (1.651 m)   Wt 213 lb 12.8 oz (97 kg)   BMI 35.58 kg/m   Wt Readings from Last 3 Encounters:  02/10/23 213 lb 12.8 oz (97 kg)  02/02/23 215 lb 8 oz (97.8 kg)  10/22/22 209 lb 12.8 oz (95.2 kg)     CMP     Component Value Date/Time   NA 137 01/20/2023 1339   NA 141 04/28/2022 1430   K 4.7 01/20/2023 1339   CL 105 01/20/2023 1339   CO2 22 01/20/2023 1339   GLUCOSE 112 (H) 01/20/2023 1339   BUN 23 01/20/2023 1339   BUN 24 04/28/2022 1430   CREATININE 0.92 01/20/2023 1339   CREATININE 0.87 07/14/2011 0938   CALCIUM 8.9 01/20/2023 1339   PROT 7.8 01/20/2023 1339   PROT 7.4 04/28/2022 1430   ALBUMIN 4.0 01/20/2023 1339   ALBUMIN 4.4 04/28/2022 1430   AST 27 01/20/2023 1339   ALT 18 01/20/2023 1339   ALKPHOS 179 (H) 01/20/2023 1339   BILITOT 0.9 01/20/2023 1339   BILITOT 0.4 04/28/2022 1430   GFRNONAA >60 01/20/2023 1339   GFRAA >60 08/21/2020 1340   Diabetic Labs (most recent): Lab Results  Component Value  Date   HGBA1C 5.1 10/30/2021   HGBA1C 5.3 04/24/2019   HGBA1C 5.3 10/28/2016   Lipid Panel     Component Value Date/Time   CHOL 217 (H) 10/08/2022 1300   TRIG 152 (  H) 10/08/2022 1300   HDL 64 10/08/2022 1300   CHOLHDL 3.4 10/08/2022 1300   LDLCALC 126 (H) 10/08/2022 1300   LABVLDL 27 10/08/2022 1300    Latest Reference Range & Units 02/02/23 13:48  TSH 0.450 - 4.500 uIU/mL 0.819  T4,Free(Direct) 0.82 - 1.77 ng/dL 1.29    Assessment & Plan:   1. Hypothyroidism  2.  Hyperlipidemia - Her thyroid function tests are consistent with appropriate replacement.  She is advised to continue levothyroxine 88 mcg p.o. daily before breakfast.    - We discussed about the correct intake of her thyroid hormone, on empty stomach at fasting, with water, separated by at least 30 minutes from breakfast and other medications,  and separated by more than 4 hours from calcium, iron, multivitamins, acid reflux medications (PPIs). -Patient is made aware of the fact that thyroid hormone replacement is needed for life, dose to be adjusted by periodic monitoring of thyroid function tests.   Regarding her hyperlipidemia: She is hesitant to go on statins.  Whole food plant-based diet was discussed again with her.  Her last LDL was 126 mg per DL.   If her LDL is greater than 100, she will be approached for statin intervention.  - Her clinical exam has been  negative for goiter, hence no need for imaging of the thyroid at this time.   - I advised patient to maintain close follow up with Rakes, Connye Burkitt, FNP for primary care needs.   I spent  21  minutes in the care of the patient today including review of labs from Thyroid Function, CMP, and other relevant labs ; imaging/biopsy records (current and previous including abstractions from other facilities); face-to-face time discussing  her lab results and symptoms, medications doses, her options of short and long term treatment based on the latest standards of care /  guidelines;   and documenting the encounter.  Andrea Martinez  participated in the discussions, expressed understanding, and voiced agreement with the above plans.  All questions were answered to her satisfaction. she is encouraged to contact clinic should she have any questions or concerns prior to her return visit.    Follow up plan: Return in about 6 months (around 08/11/2023) for Fasting Labs  in AM B4 8.  Glade Lloyd, MD Phone: [phone]  Fax: [phone]  -  This note was partially dictated with voice recognition software. Similar sounding words can be transcribed inadequately or may not  be corrected upon review.  02/10/2023, 5:43 PM

## 2023-02-15 ENCOUNTER — Inpatient Hospital Stay: Payer: PPO | Attending: Hematology

## 2023-02-15 VITALS — BP 110/47 | HR 64 | Temp 97.1°F | Resp 16

## 2023-02-15 DIAGNOSIS — D649 Anemia, unspecified: Secondary | ICD-10-CM | POA: Diagnosis not present

## 2023-02-15 DIAGNOSIS — C9210 Chronic myeloid leukemia, BCR/ABL-positive, not having achieved remission: Secondary | ICD-10-CM | POA: Insufficient documentation

## 2023-02-15 DIAGNOSIS — D509 Iron deficiency anemia, unspecified: Secondary | ICD-10-CM

## 2023-02-15 MED ORDER — SODIUM CHLORIDE 0.9 % IV SOLN
510.0000 mg | Freq: Once | INTRAVENOUS | Status: AC
  Administered 2023-02-15: 510 mg via INTRAVENOUS
  Filled 2023-02-15: qty 510

## 2023-02-15 MED ORDER — SODIUM CHLORIDE 0.9 % IV SOLN: Freq: Once | INTRAVENOUS | Status: AC

## 2023-02-15 NOTE — Patient Instructions (Signed)
[facility]  Discharge Instructions: Thank you for choosing [facility] to provide your oncology and hematology care.  If you have a lab appointment with the [facility], please come in thru the Main Entrance and check in at the main information desk.  Wear comfortable clothing and clothing appropriate for easy access to any Portacath or PICC line.   We strive to give you quality time with your provider. You may need to reschedule your appointment if you arrive late (15 or more minutes).  Arriving late affects you and other patients whose appointments are after yours.  Also, if you miss three or more appointments without notifying the office, you may be dismissed from the clinic at the provider's discretion.      For prescription refill requests, have your pharmacy contact our office and allow 72 hours for refills to be completed.    Today you received the following chemotherapy and/or immunotherapy agents Feraheme      To help prevent nausea and vomiting after your treatment, we encourage you to take your nausea medication as directed.  BELOW ARE SYMPTOMS THAT SHOULD BE REPORTED IMMEDIATELY: *FEVER GREATER THAN 100.4 F (38 C) OR HIGHER *CHILLS OR SWEATING *NAUSEA AND VOMITING THAT IS NOT CONTROLLED WITH YOUR NAUSEA MEDICATION *UNUSUAL SHORTNESS OF BREATH *UNUSUAL BRUISING OR BLEEDING *URINARY PROBLEMS (pain or burning when urinating, or frequent urination) *BOWEL PROBLEMS (unusual diarrhea, constipation, pain near the anus) TENDERNESS IN MOUTH AND THROAT WITH OR WITHOUT PRESENCE OF ULCERS (sore throat, sores in mouth, or a toothache) UNUSUAL RASH, SWELLING OR PAIN  UNUSUAL VAGINAL DISCHARGE OR ITCHING   Items with * indicate a potential emergency and should be followed up as soon as possible or go to the Emergency Department if any problems should occur.  Please show the CHEMOTHERAPY ALERT CARD or IMMUNOTHERAPY ALERT CARD at check-in to the  Emergency Department and triage nurse.  Should you have questions after your visit or need to cancel or reschedule your appointment, please contact [facility] [phone]  and follow the prompts.  Office hours are 8:00 a.m. to 4:30 p.m. Monday - Friday. Please note that voicemails left after 4:00 p.m. may not be returned until the following business day.  We are closed weekends and major holidays. You have access to a nurse at all times for urgent questions. Please call the main number to the clinic [phone] and follow the prompts.  For any non-urgent questions, you may also contact your provider using MyChart. We now offer e-Visits for anyone 93 and older to request care online for non-urgent symptoms. For details visit [email or web address].   Also download the MyChart app! Go to the app store, search "MyChart", open the app, select [facility], and log in with your MyChart username and password.

## 2023-02-15 NOTE — Progress Notes (Signed)
Patient presents today for Feraheme infusion per providers order.  Vital signs WNL.  Patient has no new complaints at this time.  Peripheral IV started and blood return noted pre and post infusion.    Stable during infusion without adverse affects.  Vital signs stable.  No complaints at this time.  Discharge from clinic ambulatory in stable condition.  Alert and oriented X 3.  Follow up with [facility] as scheduled.  

## 2023-02-21 ENCOUNTER — Telehealth: Payer: Self-pay | Admitting: Family Medicine

## 2023-02-22 ENCOUNTER — Ambulatory Visit: Payer: PPO | Admitting: Family Medicine

## 2023-02-22 NOTE — Progress Notes (Unsigned)
Referring Provider: Baruch Gouty, FNP Primary Care Physician:  Baruch Gouty, FNP Primary GI Physician: Dr. Abbey Chatters  No chief complaint on file.   HPI:   Andrea Martinez is a 77 y.o. 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 Dr. Abbey Chatters recommended pantoprazole daily for at least 8 weeks, then patient can try to wean off.  She is presenting today for ***  Today:    History of IDA  ?surveillance EGD .   Last colonoscopy in 2015 without polyps.   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 Elyse Hsu   Lipoma    left upper arm   Mixed hyperlipidemia    Osteoporosis    Pericardial effusion    a. HCAP complicated by pericardial effusion requiring pericardial window 10/2016 and large L pleural effusion requring VATS.   Pleural effusion    a. s/p VATS 2017.   Pneumonia 10/2016   PONV (postoperative nausea and vomiting)    history of   Prolonged QT interval    Urine incontinence    UTI (lower urinary tract infection)    frequent   Vitamin D deficiency     Past Surgical History:  Procedure Laterality Date   BIOPSY  03/20/2022   Procedure: BIOPSY;  Surgeon: Eloise Harman, DO;  [facility];  Service: Endoscopy;;  gastric lesion vs. gastric ulcer   BIOPSY  06/28/2022   Procedure: BIOPSY;  Surgeon: Eloise Harman, DO;  [facility];  Service: Endoscopy;;   BRAVO [place] STUDY  12/04/2012   Procedure: BRAVO [place];  Surgeon: [name], MD;  [facility];  Service: Endoscopy;  Laterality: N/A;   CHOLECYSTECTOMY N/A 01/03/2014   Procedure: LAPAROSCOPIC CHOLECYSTECTOMY WITH INTRAOPERATIVE CHOLANGIOGRAM;  Surgeon: Joyice Faster. Cornett, MD;  Location: [place];  Service: General;  Laterality: N/A;   COLONOSCOPY  01/2004   DR [facility], few small tics   ESOPHAGOGASTRODUODENOSCOPY  02/2010   gastric ulcers   ESOPHAGOGASTRODUODENOSCOPY  12/04/2012   [id number] gastritis (inflammation) was found in the gastric antrum; multiple biopsies The duodenal mucosa showed no abnormalities in the bulb and second portion of the duodenum   ESOPHAGOGASTRODUODENOSCOPY (EGD) WITH PROPOFOL N/A 03/20/2022   Procedure: ESOPHAGOGASTRODUODENOSCOPY (EGD) WITH PROPOFOL;  Surgeon: Eloise Harman, DO;  [facility];  Service: Endoscopy;  Laterality: N/A;   ESOPHAGOGASTRODUODENOSCOPY (EGD) WITH PROPOFOL N/A 06/28/2022   Procedure: ESOPHAGOGASTRODUODENOSCOPY (EGD) WITH PROPOFOL;  Surgeon: Eloise Harman, DO;  [facility];  Service: Endoscopy;  Laterality: N/A;  800   JOINT REPLACEMENT Right 10/22/2019   KYPHOPLASTY N/A 02/14/2019   Procedure: KYPHOPLASTY T12 and L1;  Surgeon: Melina Schools, MD;  Location: [place];  Service: Orthopedics;  Laterality: N/A;  120 mins   LIPOMA EXCISION  08/02/2011   left shoulder   NOSE SURGERY     PARTIAL HYSTERECTOMY     vaginal at age 18 years of age   TOE 43 Left 1962   lt great toe   TOTAL HIP ARTHROPLASTY Right 11/18/2021   Procedure: TOTAL HIP ARTHROPLASTY ANTERIOR APPROACH;  Surgeon: Gaynelle Arabian, MD;  [facility];  Service: Orthopedics;  Laterality: Right;   TOTAL KNEE ARTHROPLASTY Right 10/22/2019   Procedure: TOTAL KNEE ARTHROPLASTY;  Surgeon: Gaynelle Arabian, MD;  [facility];  Service: Orthopedics;  Laterality: Right;  8mn   TOTAL KNEE ARTHROPLASTY Left 03/03/2020   Procedure: TOTAL KNEE ARTHROPLASTY;  Surgeon: AGaynelle Arabian MD;  [facility];  Service: Orthopedics;  Laterality: Left;  568m   TRANSFORAMINAL LUMBAR INTERBODY FUSION (TLIF) WITH PEDICLE SCREW FIXATION 1 LEVEL N/A 01/28/2021   Procedure: TRANSFORAMINAL LUMBAR INTERBODY FUSION (TLIF) LUMBAR FIVE-SACRAL ONE;   Surgeon: BrMelina SchoolsMD;  [place] Service: Orthopedics;  Laterality: N/A;  4 hrs   VIDEO ASSISTED THORACOSCOPY (VATS)/EMPYEMA Left 10/25/2016   Procedure: VIDEO ASSISTED THORACOSCOPY (VATS), BRONCH,DRAINAGE OF PLEURAL EFFUSION,PERICARDIAL WINDOW WITH DRAINAGE OF PERICARDIAL FLUID, TEE;  Surgeon: StMelrose NakayamaMD;  [place] [facility] [place]: Thoracic;  Laterality: Left;   VIDEO BRONCHOSCOPY N/A 10/25/2016   Procedure: VIDEO BRONCHOSCOPY;  Surgeon: StMelrose NakayamaMD;  [place]: Thoracic;  Laterality: N/A;    Current Outpatient Medications  Medication Sig Dispense Refill   acetaminophen (TYLENOL) 500 MG tablet Take 1,000 mg by mouth every 8 (eight) hours as needed for moderate pain.     cetirizine (ZYRTEC) 10 MG tablet Take 10 mg by mouth daily.     Cholecalciferol (VITAMIN D3) 50 MCG (2000 UT) TABS Take 2,000 Units by mouth daily in the afternoon.     citalopram (CELEXA) 20 MG tablet Take 1 tablet (20 mg total) by mouth daily. 90 tablet 1   lactose free nutrition (BOOST) LIQD Take 237 mLs by mouth daily.     levothyroxine (SYNTHROID) 88 MCG tablet TAKE 1 TABLET BY MOUTH DAILY BEFORE BREAKFAST. 90 tablet 1   Multiple Vitamins-Minerals (CENTRUM SILVER PO) Take 1 tablet by mouth daily in the afternoon.     nilotinib (TASIGNA) 150 MG capsule Take 2 capsules (300 mg total) by mouth every 12 (twelve) hours. 120 capsule 12   Probiotic Product (PROBIOTIC DAILY PO) Take 2 each by mouth daily in the afternoon.     vitamin B-12 (CYANOCOBALAMIN) 1000 MCG tablet Take 1,000 mcg by mouth daily.     No current facility-administered medications for this visit.    Allergies as of 02/24/2023 - Review Complete 02/10/2023  Allergen Reaction Noted   Nucynta [tapentadol hcl] Swelling 12/31/2020   Codeine Nausea Only and Other (See Comments) 02/11/2010   Sulfonamide derivatives Nausea Only and Other (See Comments) 02/11/2010   Nexium [esomeprazole magnesium] Nausea  Only 06/23/2022   Sulfa antibiotics Nausea Only and Other (See Comments) 06/22/2022   Fluconazole Rash and Other (See Comments) 12/10/2015    Family History  Problem Relation Age of Onset   Lung cancer Father    COPD Mother    Stroke Mother    Thyroid disease Mother    Colon cancer Neg Hx    Anesthesia problems Neg Hx    Hypotension Neg Hx    Malignant hyperthermia Neg Hx    Pseudochol deficiency Neg Hx     Social History   Socioeconomic History   Marital status: Married    Spouse name: GeIona Beard Number of children: 2   Years of education: 12th grade   Highest education level: Not on file  Occupational History   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   2 granddaughters.   Retired [employer].    Social Determinants of Health   Financial Resource Strain: Low Risk  (05/17/2022)   Overall Financial Resource Strain (CARDIA)    Difficulty of Paying Living Expenses: Not hard at all  Food Insecurity: No Food Insecurity (05/17/2022)   Hunger Vital Sign    Worried About Running Out of Food in the Last Year: Never true    Ran Out of Food in the Last Year: Never true  Transportation Needs: No Transportation Needs (05/17/2022)   PRAPARE - Hydrologist (Medical): No    Lack of Transportation (Non-Medical): No  Physical Activity: Sufficiently Active (05/17/2022)   Exercise Vital Sign    Days of Exercise per Week: 5 days    Minutes of Exercise per Session: 30 min  Stress: No Stress Concern Present (05/17/2022)   [place]    Feeling of Stress : Not at all  Social Connections: [place] (05/17/2022)   Social Connection and Isolation Panel [NHANES]    Frequency of Communication with Friends and Family: More than three times a week    Frequency of Social Gatherings with Friends and Family: More than three times a week    Attends Religious Services: 1 to 4 times per year    Active Member of [employer] or Organizations: Yes    Attends Archivist Meetings: Never    Marital Status: Married    Review of Systems: Gen: Denies fever, chills, anorexia. Denies fatigue, weakness, weight loss.  CV: Denies chest pain, palpitations, syncope, peripheral edema, and claudication. Resp: Denies dyspnea at rest, cough, wheezing, coughing up blood, and pleurisy. GI: Denies vomiting blood, jaundice, and fecal incontinence.   Denies dysphagia or odynophagia. Derm: Denies rash, itching, dry skin Psych: Denies depression, anxiety, memory loss, confusion. No homicidal or suicidal ideation.  Heme: Denies bruising, bleeding, and enlarged lymph nodes.  Physical Exam: There were no vitals taken for this visit. General:   Alert and oriented. No distress noted. Pleasant and cooperative.  Head:  Normocephalic and atraumatic. Eyes:  Conjuctiva clear without scleral icterus. Heart:  S1, S2 present without murmurs appreciated. Lungs:  Clear to auscultation bilaterally. No wheezes, rales, or rhonchi. No distress.  Abdomen:  +BS, soft, non-tender and non-distended. No rebound or guarding. No HSM or masses noted. Msk:  Symmetrical without gross deformities. Normal posture. Extremities:  Without edema. Neurologic:  Alert and  oriented x4 Psych:  Normal mood and affect.    Assessment:     Plan:  ***   Aliene Altes, PA-C [facility] 02/24/2023

## 2023-02-24 ENCOUNTER — Encounter: Payer: Self-pay | Admitting: Gastroenterology

## 2023-02-24 ENCOUNTER — Ambulatory Visit (INDEPENDENT_AMBULATORY_CARE_PROVIDER_SITE_OTHER): Payer: PPO | Admitting: Gastroenterology

## 2023-02-24 ENCOUNTER — Ambulatory Visit: Payer: PPO | Admitting: Internal Medicine

## 2023-02-24 VITALS — BP 135/68 | HR 78 | Temp 97.6°F | Ht 65.0 in | Wt 216.6 lb

## 2023-02-24 DIAGNOSIS — Z8711 Personal history of peptic ulcer disease: Secondary | ICD-10-CM | POA: Diagnosis not present

## 2023-02-24 DIAGNOSIS — R1031 Right lower quadrant pain: Secondary | ICD-10-CM

## 2023-02-24 NOTE — Patient Instructions (Signed)
I am glad you are doing well overall!  Discussed your intermittent right lower quadrant abdominal pain with your primary care doctor.  I would recommend a urinalysis and considering a CT of your abdomen and pelvis.  If you change your mind and would like [place] to work this up for you, please let me know.  We will plan to follow-up with you as needed.  It was very nice to meet you today!!  Aliene Altes, PA-C [facility]

## 2023-02-25 ENCOUNTER — Telehealth: Payer: Self-pay | Admitting: Gastroenterology

## 2023-02-25 NOTE — Telephone Encounter (Signed)
Disused prior EGD biopsy results of gastric metaplasia with patient over the phone today. She has no family history of gastric cancer, so no need for surveillance. This was also reviewed with Dr. Abbey Chatters.

## 2023-03-10 ENCOUNTER — Encounter: Payer: Self-pay | Admitting: Family Medicine

## 2023-03-10 ENCOUNTER — Ambulatory Visit (INDEPENDENT_AMBULATORY_CARE_PROVIDER_SITE_OTHER): Payer: PPO | Admitting: Family Medicine

## 2023-03-10 ENCOUNTER — Ambulatory Visit (INDEPENDENT_AMBULATORY_CARE_PROVIDER_SITE_OTHER): Payer: PPO

## 2023-03-10 VITALS — BP 131/69 | HR 93 | Temp 95.2°F | Ht 65.0 in | Wt 215.6 lb

## 2023-03-10 DIAGNOSIS — R110 Nausea: Secondary | ICD-10-CM

## 2023-03-10 DIAGNOSIS — R109 Unspecified abdominal pain: Secondary | ICD-10-CM

## 2023-03-10 DIAGNOSIS — R10A1 Flank pain, right side: Secondary | ICD-10-CM

## 2023-03-10 LAB — URINALYSIS, ROUTINE W REFLEX MICROSCOPIC
Bilirubin, UA: NEGATIVE
Glucose, UA: NEGATIVE
Ketones, UA: NEGATIVE
Nitrite, UA: NEGATIVE
Protein,UA: NEGATIVE
Specific Gravity, UA: 1.02 (ref 1.005–1.030)
Urobilinogen, Ur: 0.2 mg/dL (ref 0.2–1.0)
pH, UA: 5.5 (ref 5.0–7.5)

## 2023-03-10 LAB — MICROSCOPIC EXAMINATION

## 2023-03-10 MED ORDER — ONDANSETRON HCL 4 MG PO TABS: 4.0000 mg | ORAL_TABLET | Freq: Three times a day (TID) | ORAL | 0 refills | Status: DC | PRN

## 2023-03-10 MED ORDER — KETOROLAC TROMETHAMINE 30 MG/ML IJ SOLN
30.0000 mg | Freq: Once | INTRAMUSCULAR | Status: AC
  Administered 2023-03-10: 30 mg via INTRAMUSCULAR

## 2023-03-10 NOTE — Progress Notes (Signed)
Subjective:  Patient ID: Andrea Martinez, female    DOB: August 03, 1946, 77 y.o.   MRN: [id number]  Patient Care Team: Baruch Gouty, FNP as PCP - General (Family Medicine) Derek Jack, MD as Medical Oncologist (Medical Oncology) Cassandria Anger, MD as Consulting Physician (Endocrinology) Melina Schools, MD as Consulting Physician (Orthopedic Surgery)   Chief Complaint:  Nausea (Patient states she had two units of iron infusions last month and has been nausous ever since ), Abdominal Pain (Lower abdominal pain that burns and itches x 1 week ), and Flank Pain (Right flank pain x 1 month on and off )   HPI: Andrea Martinez is a 77 y.o. female presenting on 03/10/2023 for Nausea (Patient states she had two units of iron infusions last month and has been nausous ever since ), Abdominal Pain (Lower abdominal pain that burns and itches x 1 week ), and Flank Pain (Right flank pain x 1 month on and off )   Pt presents today with complaints of ongoing abdominal pain, nausea, and dry heaving. States she had an iron infusion about a month ago and has been experiencing nausea since then. She states the right flank and lower abdominal pain has been intermittent for over a month.   Abdominal Pain This is a recurrent problem. The current episode started more than 1 month ago. The problem occurs intermittently. The problem has been waxing and waning. The pain is located in the suprapubic region and right flank. The pain is moderate. The quality of the pain is aching, cramping and burning. The abdominal pain does not radiate. Associated symptoms include anorexia, myalgias and nausea. Pertinent negatives include no arthralgias, belching, constipation, diarrhea, dysuria, fever, flatus, frequency, headaches, hematochezia, hematuria, melena, vomiting or weight loss. Nothing aggravates the pain. The pain is relieved by Nothing.  Flank Pain This is a recurrent problem. The current episode started more than  1 month ago. The problem occurs intermittently. The problem has been waxing and waning since onset. Pain location: right flank. The quality of the pain is described as stabbing, shooting, aching and burning. Associated symptoms include abdominal pain and weakness. Pertinent negatives include no bladder incontinence, bowel incontinence, chest pain, dysuria, fever, headaches, leg pain, numbness, paresis, paresthesias, pelvic pain, perianal numbness, tingling or weight loss. Risk factors include history of cancer, obesity, lack of exercise and sedentary lifestyle. She has tried nothing for the symptoms.     Relevant past medical, surgical, family, and social history reviewed and updated as indicated.  Allergies and medications reviewed and updated. Data reviewed: Chart in Epic.   Past Medical History:  Diagnosis Date   Anxiety    denies   Arthritis    Chronic myelogenous leukemia (CML), BCR-ABL1-positive ([place]) 11/20/2015   Gastric ulcer 2011   EGD, 5/11   History of peptic ulcer disease    Hypothyroidism    not on meds, followed by Dr. Elyse Hsu   Lipoma    left upper arm   Mixed hyperlipidemia    Osteoporosis    Pericardial effusion    a. HCAP complicated by pericardial effusion requiring pericardial window 10/2016 and large L pleural effusion requring VATS.   Pleural effusion    a. s/p VATS 2017.   Pneumonia 10/2016   PONV (postoperative nausea and vomiting)    history of   Prolonged QT interval    Urine incontinence    UTI (lower urinary tract infection)    frequent   Vitamin D deficiency  Past Surgical History:  Procedure Laterality Date   BIOPSY  03/20/2022   Procedure: BIOPSY;  Surgeon: Eloise Harman, DO;  [facility];  Service: Endoscopy;;  gastric lesion vs. gastric ulcer   BIOPSY  06/28/2022   Procedure: BIOPSY;  Surgeon: Eloise Harman, DO;  [facility];  Service: Endoscopy;;   BRAVO [place] STUDY  12/04/2012   Procedure: BRAVO [place];   Surgeon: [name], MD;  [facility];  Service: Endoscopy;  Laterality: N/A;   CHOLECYSTECTOMY N/A 01/03/2014   Procedure: LAPAROSCOPIC CHOLECYSTECTOMY WITH INTRAOPERATIVE CHOLANGIOGRAM;  Surgeon: Joyice Faster. Cornett, MD;  Location: [place];  Service: General;  Laterality: N/A;   COLONOSCOPY  01/2004   DR [facility], few small tics   ESOPHAGOGASTRODUODENOSCOPY  02/2010   gastric ulcers   ESOPHAGOGASTRODUODENOSCOPY  12/04/2012   [id number] gastritis (inflammation) was found in the gastric antrum; multiple biopsies The duodenal mucosa showed no abnormalities in the bulb and second portion of the duodenum   ESOPHAGOGASTRODUODENOSCOPY (EGD) WITH PROPOFOL N/A 03/20/2022   Procedure: ESOPHAGOGASTRODUODENOSCOPY (EGD) WITH PROPOFOL;  Surgeon: Eloise Harman, DO;  [facility];  Service: Endoscopy;  Laterality: N/A;   ESOPHAGOGASTRODUODENOSCOPY (EGD) WITH PROPOFOL N/A 06/28/2022   Procedure: ESOPHAGOGASTRODUODENOSCOPY (EGD) WITH PROPOFOL;  Surgeon: Eloise Harman, DO;  [facility];  Service: Endoscopy;  Laterality: N/A;  800   JOINT REPLACEMENT Right 10/22/2019   KYPHOPLASTY N/A 02/14/2019   Procedure: KYPHOPLASTY T12 and L1;  Surgeon: Melina Schools, MD;  Location: [place];  Service: Orthopedics;  Laterality: N/A;  120 mins   LIPOMA EXCISION  08/02/2011   left shoulder   NOSE SURGERY     PARTIAL HYSTERECTOMY     vaginal at age 68 years of age   TOE 77 Left 1962   lt great toe   TOTAL HIP ARTHROPLASTY Right 11/18/2021   Procedure: TOTAL HIP ARTHROPLASTY ANTERIOR APPROACH;  Surgeon: Gaynelle Arabian, MD;  [facility];  Service: Orthopedics;  Laterality: Right;   TOTAL KNEE ARTHROPLASTY Right 10/22/2019   Procedure: TOTAL KNEE ARTHROPLASTY;  Surgeon: Gaynelle Arabian, MD;  [facility];  Service: Orthopedics;  Laterality: Right;  41min   TOTAL KNEE ARTHROPLASTY Left 03/03/2020   Procedure: TOTAL KNEE ARTHROPLASTY;  Surgeon: Gaynelle Arabian,  MD;  [facility];  Service: Orthopedics;  Laterality: Left;  2min   TRANSFORAMINAL LUMBAR INTERBODY FUSION (TLIF) WITH PEDICLE SCREW FIXATION 1 LEVEL N/A 01/28/2021   Procedure: TRANSFORAMINAL LUMBAR INTERBODY FUSION (TLIF) LUMBAR FIVE-SACRAL ONE;  Surgeon: Melina Schools, MD;  Location: [place];  Service: Orthopedics;  Laterality: N/A;  4 hrs   VIDEO ASSISTED THORACOSCOPY (VATS)/EMPYEMA Left 10/25/2016   Procedure: VIDEO ASSISTED THORACOSCOPY (VATS), BRONCH,DRAINAGE OF PLEURAL EFFUSION,PERICARDIAL WINDOW WITH DRAINAGE OF PERICARDIAL FLUID, TEE;  Surgeon: [name], MD;  Location: [place];  Service: Thoracic;  Laterality: Left;   VIDEO BRONCHOSCOPY N/A 10/25/2016   Procedure: VIDEO BRONCHOSCOPY;  Surgeon: [name], MD;  Location: [place];  Service: Thoracic;  Laterality: N/A;    Social History   Socioeconomic History   Marital status: Married    Spouse name: [name]   Number of children: 2   Years of education: 12th grade   Highest education level: Not on file  Occupational History   Occupation: retired    Fish farm manager: UENPLOYED    Employer: RETIRED  Tobacco Use   Smoking status: Never   Smokeless tobacco: Never  Vaping Use   Vaping  Use: Never used  Substance and Sexual Activity   Alcohol use: No    Alcohol/week: 0.0 standard drinks of alcohol   Drug use: No   Sexual activity: Yes    Partners: Male    Birth control/protection: None  Other Topics Concern   Not on file  Social History Narrative   1 son, 1 daughter.   2 granddaughters.   Retired [employer].    Social Determinants of Health   Financial Resource Strain: Low Risk  (05/17/2022)   Overall Financial Resource Strain (CARDIA)    Difficulty of Paying Living Expenses: Not hard at all  Food Insecurity: No Food Insecurity (05/17/2022)   Hunger Vital Sign    Worried About Running Out of Food in the Last Year: Never true    Ran Out of Food in the Last Year: Never true  Transportation Needs: No Transportation  Needs (05/17/2022)   PRAPARE - Hydrologist (Medical): No    Lack of Transportation (Non-Medical): No  Physical Activity: Sufficiently Active (05/17/2022)   Exercise Vital Sign    Days of Exercise per Week: 5 days    Minutes of Exercise per Session: 30 min  Stress: No Stress Concern Present (05/17/2022)   [place]    Feeling of Stress : Not at all  Social Connections: [place] (05/17/2022)   Social Connection and Isolation Panel [NHANES]    Frequency of Communication with Friends and Family: More than three times a week    Frequency of Social Gatherings with Friends and Family: More than three times a week    Attends Religious Services: 1 to 4 times per year    Active Member of [employer] or Organizations: Yes    Attends Archivist Meetings: Never    Marital Status: Married  Human resources officer Violence: Not At Risk (05/17/2022)   Humiliation, Afraid, Rape, and Kick questionnaire    Fear of Current or Ex-Partner: No    Emotionally Abused: No    Physically Abused: No    Sexually Abused: No    Outpatient Encounter Medications as of 03/10/2023  Medication Sig   acetaminophen (TYLENOL) 500 MG tablet Take 1,000 mg by mouth every 8 (eight) hours as needed for moderate pain.   cetirizine (ZYRTEC) 10 MG tablet Take 10 mg by mouth daily.   Cholecalciferol (VITAMIN D3) 50 MCG (2000 UT) TABS Take 2,000 Units by mouth daily in the afternoon.   citalopram (CELEXA) 20 MG tablet Take 1 tablet (20 mg total) by mouth daily.   lactose free nutrition (BOOST) LIQD Take 237 mLs by mouth daily.   levothyroxine (SYNTHROID) 88 MCG tablet TAKE 1 TABLET BY MOUTH DAILY BEFORE BREAKFAST.   Multiple Vitamins-Minerals (CENTRUM SILVER PO) Take 1 tablet by mouth daily in the afternoon.   nilotinib (TASIGNA) 150 MG capsule Take 2 capsules (300 mg total) by mouth every 12 (twelve) hours.   ondansetron (ZOFRAN) 4 MG  tablet Take 1 tablet (4 mg total) by mouth every 8 (eight) hours as needed for nausea or vomiting.   Probiotic Product (PROBIOTIC DAILY PO) Take 2 each by mouth daily in the afternoon.   vitamin B-12 (CYANOCOBALAMIN) 1000 MCG tablet Take 1,000 mcg by mouth daily.   [EXPIRED] ketorolac (TORADOL) 30 MG/ML injection 30 mg    No facility-administered encounter medications on file as of 03/10/2023.    Allergies  Allergen Reactions   Nucynta [Tapentadol Hcl] Swelling   Codeine Nausea Only  and Other (See Comments)    Headache   Sulfonamide Derivatives Nausea Only and Other (See Comments)    Headache   Nexium [Esomeprazole Magnesium] Nausea Only   Sulfa Antibiotics Nausea Only and Other (See Comments)   Fluconazole Rash and Other (See Comments)    Redness and blistering on left thigh    Review of Systems  Constitutional:  Positive for activity change, appetite change, diaphoresis and fatigue. Negative for chills, fever, unexpected weight change and weight loss.  HENT: Negative.    Eyes: Negative.  Negative for photophobia and visual disturbance.  Respiratory:  Negative for cough, chest tightness and shortness of breath.   Cardiovascular:  Negative for chest pain, palpitations and leg swelling.  Gastrointestinal:  Positive for abdominal pain, anorexia and nausea. Negative for abdominal distention, anal bleeding, blood in stool, bowel incontinence, constipation, diarrhea, flatus, hematochezia, melena, rectal pain and vomiting.  Endocrine: Negative.  Negative for polydipsia, polyphagia and polyuria.  Genitourinary:  Positive for difficulty urinating and flank pain. Negative for bladder incontinence, decreased urine volume, dysuria, frequency, hematuria, pelvic pain, urgency, vaginal bleeding, vaginal discharge and vaginal pain.  Musculoskeletal:  Positive for myalgias. Negative for arthralgias.  Skin: Negative.   Allergic/Immunologic: Negative.   Neurological:  Positive for weakness. Negative  for dizziness, tingling, tremors, seizures, syncope, facial asymmetry, speech difficulty, light-headedness, numbness, headaches and paresthesias.  Hematological: Negative.   Psychiatric/Behavioral:  Negative for confusion, hallucinations, sleep disturbance and suicidal ideas.   All other systems reviewed and are negative.       Objective:  BP 131/69   Pulse 93   Temp (!) 95.2 F (35.1 C) (Temporal)   Ht 5\' 5"  (1.651 m)   Wt 215 lb 9.6 oz (97.8 kg)   SpO2 97%   BMI 35.88 kg/m    Wt Readings from Last 3 Encounters:  03/10/23 215 lb 9.6 oz (97.8 kg)  02/24/23 216 lb 9.6 oz (98.2 kg)  02/10/23 213 lb 12.8 oz (97 kg)    Physical Exam Vitals and nursing note reviewed.  Constitutional:      General: She is not in acute distress.    Appearance: She is obese. She is ill-appearing (chronically ill) and diaphoretic. She is not toxic-appearing.  HENT:     Head: Normocephalic and atraumatic.     Mouth/Throat:     Mouth: Mucous membranes are moist.  Eyes:     Conjunctiva/sclera: Conjunctivae normal.     Pupils: Pupils are equal, round, and reactive to light.  Cardiovascular:     Rate and Rhythm: Normal rate and regular rhythm.     Heart sounds: Normal heart sounds.  Pulmonary:     Effort: Pulmonary effort is normal.     Breath sounds: Normal breath sounds.  Abdominal:     General: Abdomen is protuberant. Bowel sounds are normal.     Palpations: Abdomen is soft.     Tenderness: There is abdominal tenderness in the right lower quadrant and suprapubic area. There is no right CVA tenderness, left CVA tenderness, guarding or rebound. Negative signs include Murphy's sign, Rovsing's sign, McBurney's sign, psoas sign and obturator sign.     Hernia: No hernia is present.  Musculoskeletal:     Cervical back: Normal range of motion and neck supple.     Right lower leg: No edema.     Left lower leg: No edema.  Skin:    General: Skin is warm.     Capillary Refill: Capillary refill takes  less than 2 seconds.  Neurological:     General: No focal deficit present.     Mental Status: She is alert and oriented to person, place, and time.  Psychiatric:        Attention and Perception: Attention and perception normal.        Mood and Affect: Mood is anxious. Affect is tearful.        Speech: Speech normal.        Behavior: Behavior normal.        Thought Content: Thought content normal.        Cognition and Memory: Cognition and memory normal.        Judgment: Judgment normal.     Results for orders placed or performed in visit on 01/20/23  BCR-ABL1, CML/ALL, PCR, QUANT  Result Value Ref Range   b2a2 transcript 0.1274 %   b3a2 transcript 0.0561 %   E1A2 Transcript Comment %   Interpretation (BCRAL): Positive (A)    Director Review [facility]): Comment    Background: Comment    Methodology Comment   CBC with Differential/Platelet  Result Value Ref Range   WBC 10.1 4.0 - 10.5 K/uL   RBC 4.28 3.87 - 5.11 MIL/uL   Hemoglobin 11.9 (L) 12.0 - 15.0 g/dL   HCT 38.1 36.0 - 46.0 %   MCV 89.0 80.0 - 100.0 fL   MCH 27.8 26.0 - 34.0 pg   MCHC 31.2 30.0 - 36.0 g/dL   RDW 13.1 11.5 - 15.5 %   Platelets 249 150 - 400 K/uL   nRBC 0.0 0.0 - 0.2 %   Neutrophils Relative % 81 %   Neutro Abs 8.1 (H) 1.7 - 7.7 K/uL   Lymphocytes Relative 12 %   Lymphs Abs 1.2 0.7 - 4.0 K/uL   Monocytes Relative 6 %   Monocytes Absolute 0.6 0.1 - 1.0 K/uL   Eosinophils Relative 1 %   Eosinophils Absolute 0.1 0.0 - 0.5 K/uL   Basophils Relative 0 %   Basophils Absolute 0.0 0.0 - 0.1 K/uL   Immature Granulocytes 0 %   Abs Immature Granulocytes 0.04 0.00 - 0.07 K/uL  Comprehensive metabolic panel  Result Value Ref Range   Sodium 137 135 - 145 mmol/L   Potassium 4.7 3.5 - 5.1 mmol/L   Chloride 105 98 - 111 mmol/L   CO2 22 22 - 32 mmol/L   Glucose, Bld 112 (H) 70 - 99 mg/dL   BUN 23 8 - 23 mg/dL   Creatinine, Ser 0.92 0.44 - 1.00 mg/dL   Calcium 8.9 8.9 - 10.3 mg/dL   Total Protein 7.8 6.5 - 8.1  g/dL   Albumin 4.0 3.5 - 5.0 g/dL   AST 27 15 - 41 U/L   ALT 18 0 - 44 U/L   Alkaline Phosphatase 179 (H) 38 - 126 U/L   Total Bilirubin 0.9 0.3 - 1.2 mg/dL   GFR, Estimated >60 >60 mL/min   Anion gap 10 5 - 15  Lactate dehydrogenase  Result Value Ref Range   LDH 152 98 - 192 U/L  Iron and TIBC  Result Value Ref Range   Iron 38 28 - 170 ug/dL   TIBC 349 250 - 450 ug/dL   Saturation Ratios 11 10.4 - 31.8 %   UIBC 311 ug/dL  Ferritin  Result Value Ref Range   Ferritin 56 11 - 307 ng/mL     X-Ray: KUB: No acute findings. Preliminary x-ray reading by Monia Pouch, FNP-C, [facility].   Pertinent labs & imaging  results that were available during my care of the patient were reviewed by me and considered in my medical decision making.  Assessment & Plan:  Vermelle was seen today for nausea, abdominal pain and flank pain.  Diagnoses and all orders for this visit:  Abdominal pain, acute Right flank pain Nausea Unable to provide urine in office. Will take specimen cup home and bring back. KUB unremarkable. Labs pending. No red flags present concerning for acute process. Pt aware of red flags and when to seek emergent evaluation and treatment. Zofran as needed for nausea. Toradol IM given in office for pain. Further treatment pending results.  -     Urinalysis, Routine w reflex microscopic -     DG Abd 1 View; Future -     Amylase -     Anemia Profile B -     CMP14+EGFR -     Lipase -     Urine Culture -     ondansetron (ZOFRAN) 4 MG tablet; Take 1 tablet (4 mg total) by mouth every 8 (eight) hours as needed for nausea or vomiting.     Continue all other maintenance medications.  Follow up plan: Return in 1 week (on 03/17/2023), or if symptoms worsen or fail to improve, for abdominal pain.   Continue healthy lifestyle choices, including diet (rich in fruits, vegetables, and lean proteins, and low in salt and simple carbohydrates) and exercise (at least 30 minutes of moderate physical  activity daily).  Educational handout given for abdominal pain, nausea  The above assessment and management plan was discussed with the patient. The patient verbalized understanding of and has agreed to the management plan. Patient is aware to call the clinic if they develop any new symptoms or if symptoms persist or worsen. Patient is aware when to return to the clinic for a follow-up visit. Patient educated on when it is appropriate to go to the emergency department.   Monia Pouch, FNP-C [facility] [phone]

## 2023-03-12 LAB — ANEMIA PROFILE B
Basophils Absolute: 0 10*3/uL (ref 0.0–0.2)
Basos: 0 %
EOS (ABSOLUTE): 0.2 10*3/uL (ref 0.0–0.4)
Eos: 2 %
Ferritin: 749 ng/mL — ABNORMAL HIGH (ref 15–150)
Folate: >20 ng/mL (ref >3.0)
Hematocrit: 39.9 % (ref 34.0–46.6)
Hemoglobin: 13.2 g/dL (ref 11.1–15.9)
Immature Grans (Abs): 0.1 10*3/uL (ref 0.0–0.1)
Immature Granulocytes: 1 %
Iron Saturation: 23 % (ref 15–55)
Iron: 63 ug/dL (ref 27–139)
Lymphocytes Absolute: 1.3 10*3/uL (ref 0.7–3.1)
Lymphs: 12 %
MCH: 29.1 pg (ref 26.6–33.0)
MCHC: 33.1 g/dL (ref 31.5–35.7)
MCV: 88 fL (ref 79–97)
Monocytes Absolute: 0.8 10*3/uL (ref 0.1–0.9)
Monocytes: 7 %
Neutrophils Absolute: 8.6 10*3/uL — ABNORMAL HIGH (ref 1.4–7.0)
Neutrophils: 78 %
Platelets: 283 10*3/uL (ref 150–450)
RBC: 4.54 x10E6/uL (ref 3.77–5.28)
RDW: 13.7 % (ref 11.7–15.4)
Retic Ct Pct: 1.9 % (ref 0.6–2.6)
Total Iron Binding Capacity: 271 ug/dL (ref 250–450)
UIBC: 208 ug/dL (ref 118–369)
Vitamin B-12: 1454 pg/mL — ABNORMAL HIGH (ref 232–1245)
WBC: 11 10*3/uL — ABNORMAL HIGH (ref 3.4–10.8)

## 2023-03-12 LAB — CMP14+EGFR
ALT: 18 IU/L (ref 0–32)
AST: 26 IU/L (ref 0–40)
Albumin/Globulin Ratio: 1.6 (ref 1.2–2.2)
Albumin: 4.5 g/dL (ref 3.8–4.8)
Alkaline Phosphatase: 211 IU/L — ABNORMAL HIGH (ref 44–121)
BUN/Creatinine Ratio: 18 (ref 12–28)
BUN: 22 mg/dL (ref 8–27)
Bilirubin Total: 0.6 mg/dL (ref 0.0–1.2)
CO2: 22 mmol/L (ref 20–29)
Calcium: 9.4 mg/dL (ref 8.7–10.3)
Chloride: 100 mmol/L (ref 96–106)
Creatinine, Ser: 1.22 mg/dL — ABNORMAL HIGH (ref 0.57–1.00)
Globulin, Total: 2.9 g/dL (ref 1.5–4.5)
Glucose: 137 mg/dL — ABNORMAL HIGH (ref 70–99)
Potassium: 5 mmol/L (ref 3.5–5.2)
Sodium: 138 mmol/L (ref 134–144)
Total Protein: 7.4 g/dL (ref 6.0–8.5)
eGFR: 45 mL/min/{1.73_m2} — ABNORMAL LOW (ref >59)

## 2023-03-12 LAB — AMYLASE: Amylase: 57 U/L (ref 31–110)

## 2023-03-12 LAB — URINE CULTURE

## 2023-03-12 LAB — LIPASE: Lipase: 38 U/L (ref 14–85)

## 2023-03-14 ENCOUNTER — Telehealth: Payer: Self-pay | Admitting: Family Medicine

## 2023-03-14 NOTE — Telephone Encounter (Signed)
Patient is in pain and would like something called in. She would like to speak to a nurse about this because she said she also wants to know when she needs to come back in. Please call back and advise.

## 2023-03-14 NOTE — Telephone Encounter (Signed)
Left message making pt aware that the culture results are not back yet and that we will call her once reviewed.Advised to call back if needed.

## 2023-03-14 NOTE — Telephone Encounter (Signed)
Needs appointment with [facility]

## 2023-03-15 NOTE — Telephone Encounter (Signed)
Scheduled appointment with PCP for this Thursday

## 2023-03-17 ENCOUNTER — Encounter: Payer: Self-pay | Admitting: Family Medicine

## 2023-03-17 ENCOUNTER — Ambulatory Visit (INDEPENDENT_AMBULATORY_CARE_PROVIDER_SITE_OTHER): Payer: PPO | Admitting: Family Medicine

## 2023-03-17 ENCOUNTER — Ambulatory Visit (HOSPITAL_COMMUNITY)
Admission: RE | Admit: 2023-03-17 | Discharge: 2023-03-17 | Disposition: A | Discharge status: Home / Self Care | Payer: PPO | Source: Ambulatory Visit | Attending: Family Medicine | Admitting: Family Medicine

## 2023-03-17 VITALS — BP 126/69 | HR 88 | Temp 94.8°F | Ht 65.0 in | Wt 214.8 lb

## 2023-03-17 DIAGNOSIS — R109 Unspecified abdominal pain: Secondary | ICD-10-CM | POA: Diagnosis not present

## 2023-03-17 DIAGNOSIS — R1031 Right lower quadrant pain: Secondary | ICD-10-CM | POA: Insufficient documentation

## 2023-03-17 DIAGNOSIS — A09 Infectious gastroenteritis and colitis, unspecified: Secondary | ICD-10-CM | POA: Diagnosis not present

## 2023-03-17 DIAGNOSIS — K6389 Other specified diseases of intestine: Secondary | ICD-10-CM | POA: Diagnosis not present

## 2023-03-17 MED ORDER — IOHEXOL 300 MG/ML  SOLN
80.0000 mL | Freq: Once | INTRAMUSCULAR | Status: AC | PRN
  Administered 2023-03-17: 80 mL via INTRAVENOUS

## 2023-03-17 NOTE — Progress Notes (Signed)
Subjective:  Patient ID: Andrea Martinez, female    DOB: 1946-04-29, 77 y.o.   MRN: [id number]  Patient Care Team: Baruch Gouty, FNP as PCP - General (Family Medicine) Derek Jack, MD as Medical Oncologist (Medical Oncology) Cassandria Anger, MD as Consulting Physician (Endocrinology) Melina Schools, MD as Consulting Physician (Orthopedic Surgery)   Chief Complaint:  Abdominal Pain (Right sided abd pain that is on and off )   HPI: Andrea Martinez is a 77 y.o. female presenting on 03/17/2023 for Abdominal Pain (Right sided abd pain that is on and off )   Pt presents today for continued RLQ and right flank pain. Urinalysis at last visit was negative. KUB unremarkable. Labs revealed slightly elevated WBC and alk phos, slightly declined renal function. She states the pain has not resolved. She does have decreased appetite and nausea with the pain.   Abdominal Pain This is a recurrent problem. The problem occurs constantly. The problem has been gradually worsening. The pain is located in the RUQ, RLQ, suprapubic region and periumbilical region. The pain is moderate. The quality of the pain is aching, colicky and cramping. Associated symptoms include anorexia and nausea. Pertinent negatives include no arthralgias, belching, constipation, diarrhea, dysuria, fever, flatus, frequency, headaches, hematochezia, hematuria, melena, myalgias, vomiting or weight loss.    Relevant past medical, surgical, family, and social history reviewed and updated as indicated.  Allergies and medications reviewed and updated. Data reviewed: Chart in Epic.   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 Elyse Hsu   Lipoma    left upper arm   Mixed hyperlipidemia    Osteoporosis     Pericardial effusion    a. HCAP complicated by pericardial effusion requiring pericardial window 10/2016 and large L pleural effusion requring VATS.   Pleural effusion    a. s/p VATS 2017.   Pneumonia 10/2016   PONV (postoperative nausea and vomiting)    history of   Prolonged QT interval    Urine incontinence    UTI (lower urinary tract infection)    frequent   Vitamin D deficiency     Past Surgical History:  Procedure Laterality Date   BIOPSY  03/20/2022   Procedure: BIOPSY;  Surgeon: Eloise Harman, DO;  [facility];  Service: Endoscopy;;  gastric lesion vs. gastric ulcer   BIOPSY  06/28/2022   Procedure: BIOPSY;  Surgeon: Eloise Harman, DO;  [facility];  Service: Endoscopy;;   BRAVO [place] STUDY  12/04/2012   Procedure: BRAVO [place];  Surgeon: [name], MD;  [facility];  Service: Endoscopy;  Laterality: N/A;   CHOLECYSTECTOMY N/A 01/03/2014   Procedure: LAPAROSCOPIC CHOLECYSTECTOMY WITH INTRAOPERATIVE CHOLANGIOGRAM;  Surgeon: Joyice Faster. Cornett, MD;  Location: [place];  Service: General;  Laterality: N/A;   COLONOSCOPY  01/2004   [facility], few small tics   ESOPHAGOGASTRODUODENOSCOPY  02/2010   gastric ulcers   ESOPHAGOGASTRODUODENOSCOPY  12/04/2012   [id number] gastritis (inflammation) was found in the gastric antrum; multiple biopsies The duodenal mucosa showed no abnormalities in the bulb and second portion of the duodenum   ESOPHAGOGASTRODUODENOSCOPY (EGD) WITH PROPOFOL N/A 03/20/2022   Procedure: ESOPHAGOGASTRODUODENOSCOPY (EGD) WITH  PROPOFOL;  Surgeon: Eloise Harman, DO;  [facility];  Service: Endoscopy;  Laterality: N/A;   ESOPHAGOGASTRODUODENOSCOPY (EGD) WITH PROPOFOL N/A 06/28/2022   Procedure: ESOPHAGOGASTRODUODENOSCOPY (EGD) WITH PROPOFOL;  Surgeon: Eloise Harman, DO;  [facility];  Service: Endoscopy;  Laterality: N/A;  800   JOINT REPLACEMENT Right 10/22/2019   KYPHOPLASTY  N/A 02/14/2019   Procedure: KYPHOPLASTY T12 and L1;  Surgeon: Melina Schools, MD;  Location: [place];  Service: Orthopedics;  Laterality: N/A;  120 mins   LIPOMA EXCISION  08/02/2011   left shoulder   NOSE SURGERY     PARTIAL HYSTERECTOMY     vaginal at age 71 years of age   TOE 21 Left 1962   lt great toe   TOTAL HIP ARTHROPLASTY Right 11/18/2021   Procedure: TOTAL HIP ARTHROPLASTY ANTERIOR APPROACH;  Surgeon: Gaynelle Arabian, MD;  [facility];  Service: Orthopedics;  Laterality: Right;   TOTAL KNEE ARTHROPLASTY Right 10/22/2019   Procedure: TOTAL KNEE ARTHROPLASTY;  Surgeon: Gaynelle Arabian, MD;  [facility];  Service: Orthopedics;  Laterality: Right;  46min   TOTAL KNEE ARTHROPLASTY Left 03/03/2020   Procedure: TOTAL KNEE ARTHROPLASTY;  Surgeon: Gaynelle Arabian, MD;  [facility];  Service: Orthopedics;  Laterality: Left;  31min   TRANSFORAMINAL LUMBAR INTERBODY FUSION (TLIF) WITH PEDICLE SCREW FIXATION 1 LEVEL N/A 01/28/2021   Procedure: TRANSFORAMINAL LUMBAR INTERBODY FUSION (TLIF) LUMBAR FIVE-SACRAL ONE;  Surgeon: Melina Schools, MD;  Location: [place];  Service: Orthopedics;  Laterality: N/A;  4 hrs   VIDEO ASSISTED THORACOSCOPY (VATS)/EMPYEMA Left 10/25/2016   Procedure: VIDEO ASSISTED THORACOSCOPY (VATS), BRONCH,DRAINAGE OF PLEURAL EFFUSION,PERICARDIAL WINDOW WITH DRAINAGE OF PERICARDIAL FLUID, TEE;  Surgeon: [name], MD;  Location: [place];  Service: Thoracic;  Laterality: Left;   VIDEO BRONCHOSCOPY N/A 10/25/2016   Procedure: VIDEO BRONCHOSCOPY;  Surgeon: [name], MD;  Location: [place];  Service: Thoracic;  Laterality: N/A;    Social History   Socioeconomic History   Marital status: Married    Spouse name: [name]   Number of children: 2   Years of education: 12th grade   Highest education level: Not on file  Occupational History   Occupation: retired    Fish farm manager: UENPLOYED    Employer: RETIRED  Tobacco Use   Smoking status: Never    Smokeless tobacco: Never  Vaping Use   Vaping Use: Never used  Substance and Sexual Activity   Alcohol use: No    Alcohol/week: 0.0 standard drinks of alcohol   Drug use: No   Sexual activity: Yes    Partners: Male    Birth control/protection: None  Other Topics Concern   Not on file  Social History Narrative   1 son, 1 daughter.   2 granddaughters.   Retired [place].    Social Determinants of Health   Financial Resource Strain: Low Risk  (05/17/2022)   Overall Financial Resource Strain (CARDIA)    Difficulty of Paying Living Expenses: Not hard at all  Food Insecurity: No Food Insecurity (05/17/2022)   Hunger Vital Sign    Worried About Running Out of Food in the Last Year: Never true    Ran Out of Food in the Last Year: Never true  Transportation Needs: No Transportation Needs (05/17/2022)   PRAPARE - Hydrologist (Medical): No    Lack of Transportation (Non-Medical): No  Physical Activity: Sufficiently Active (05/17/2022)   Exercise Vital Sign    Days  of Exercise per Week: 5 days    Minutes of Exercise per Session: 30 min  Stress: No Stress Concern Present (05/17/2022)   [place]    Feeling of Stress : Not at all  Social Connections: [place] (05/17/2022)   Social Connection and Isolation Panel [NHANES]    Frequency of Communication with Friends and Family: More than three times a week    Frequency of Social Gatherings with Friends and Family: More than three times a week    Attends Religious Services: 1 to 4 times per year    Active Member of [employer] or Organizations: Yes    Attends Archivist Meetings: Never    Marital Status: Married  Human resources officer Violence: Not At Risk (05/17/2022)   Humiliation, Afraid, Rape, and Kick questionnaire    Fear of Current or Ex-Partner: No    Emotionally Abused: No    Physically Abused: No    Sexually Abused: No    Outpatient  Encounter Medications as of 03/17/2023  Medication Sig   acetaminophen (TYLENOL) 500 MG tablet Take 1,000 mg by mouth every 8 (eight) hours as needed for moderate pain.   cetirizine (ZYRTEC) 10 MG tablet Take 10 mg by mouth daily.   Cholecalciferol (VITAMIN D3) 50 MCG (2000 UT) TABS Take 2,000 Units by mouth daily in the afternoon.   citalopram (CELEXA) 20 MG tablet Take 1 tablet (20 mg total) by mouth daily.   lactose free nutrition (BOOST) LIQD Take 237 mLs by mouth daily.   levothyroxine (SYNTHROID) 88 MCG tablet TAKE 1 TABLET BY MOUTH DAILY BEFORE BREAKFAST.   Multiple Vitamins-Minerals (CENTRUM SILVER PO) Take 1 tablet by mouth daily in the afternoon.   nilotinib (TASIGNA) 150 MG capsule Take 2 capsules (300 mg total) by mouth every 12 (twelve) hours.   ondansetron (ZOFRAN) 4 MG tablet Take 1 tablet (4 mg total) by mouth every 8 (eight) hours as needed for nausea or vomiting.   Probiotic Product (PROBIOTIC DAILY PO) Take 2 each by mouth daily in the afternoon.   vitamin B-12 (CYANOCOBALAMIN) 1000 MCG tablet Take 1,000 mcg by mouth daily.   No facility-administered encounter medications on file as of 03/17/2023.    Allergies  Allergen Reactions   Nucynta [Tapentadol Hcl] Swelling   Codeine Nausea Only and Other (See Comments)    Headache   Sulfonamide Derivatives Nausea Only and Other (See Comments)    Headache   Nexium [Esomeprazole Magnesium] Nausea Only   Sulfa Antibiotics Nausea Only and Other (See Comments)   Fluconazole Rash and Other (See Comments)    Redness and blistering on left thigh    Review of Systems  Constitutional:  Positive for activity change, appetite change, diaphoresis and fatigue. Negative for chills, fever, unexpected weight change and weight loss.  HENT: Negative.    Eyes: Negative.   Respiratory:  Negative for cough, chest tightness and shortness of breath.   Cardiovascular:  Negative for chest pain, palpitations and leg swelling.  Gastrointestinal:   Positive for abdominal pain, anorexia and nausea. Negative for abdominal distention, anal bleeding, blood in stool, constipation, diarrhea, flatus, hematochezia, melena, rectal pain and vomiting.  Endocrine: Negative.   Genitourinary:  Positive for difficulty urinating and flank pain. Negative for decreased urine volume, dysuria, enuresis, frequency, hematuria and urgency.  Musculoskeletal:  Negative for arthralgias and myalgias.  Skin: Negative.   Allergic/Immunologic: Negative.   Neurological:  Negative for dizziness, tremors, seizures, syncope, facial asymmetry, speech difficulty,  weakness, light-headedness, numbness and headaches.  Hematological: Negative.   Psychiatric/Behavioral:  Negative for confusion, hallucinations, sleep disturbance and suicidal ideas.   All other systems reviewed and are negative.       Objective:  BP 126/69   Pulse 88   Temp (!) 94.8 F (34.9 C) (Temporal)   Ht 5\' 5"  (1.651 m)   Wt 214 lb 12.8 oz (97.4 kg)   SpO2 98%   BMI 35.74 kg/m    Wt Readings from Last 3 Encounters:  03/17/23 214 lb 12.8 oz (97.4 kg)  03/10/23 215 lb 9.6 oz (97.8 kg)  02/24/23 216 lb 9.6 oz (98.2 kg)    Physical Exam Vitals and nursing note reviewed.  Constitutional:      General: She is in acute distress.     Appearance: She is well-developed. She is obese. She is ill-appearing (chronically ill) and diaphoretic. She is not toxic-appearing.  HENT:     Head: Normocephalic and atraumatic.     Mouth/Throat:     Mouth: Mucous membranes are moist.  Eyes:     Extraocular Movements: Extraocular movements intact.     Pupils: Pupils are equal, round, and reactive to light.  Cardiovascular:     Rate and Rhythm: Normal rate and regular rhythm.     Heart sounds: Normal heart sounds.  Pulmonary:     Effort: Pulmonary effort is normal.     Breath sounds: Normal breath sounds.  Abdominal:     General: Bowel sounds are normal.     Tenderness: There is abdominal tenderness in the  right upper quadrant, right lower quadrant and suprapubic area. There is guarding. There is no right CVA tenderness, left CVA tenderness or rebound. Negative signs include Murphy's sign, Rovsing's sign, McBurney's sign, psoas sign and obturator sign.  Skin:    General: Skin is warm.     Capillary Refill: Capillary refill takes less than 2 seconds.  Neurological:     General: No focal deficit present.     Mental Status: She is alert and oriented to person, place, and time.  Psychiatric:        Mood and Affect: Mood is anxious.        Behavior: Behavior normal.        Thought Content: Thought content normal.        Judgment: Judgment normal.     Results for orders placed or performed in visit on 03/10/23  Urine Culture   Specimen: Urine   UR  Result Value Ref Range   Urine Culture, Routine Final report    Organism ID, Bacteria Comment   Microscopic Examination   Urine  Result Value Ref Range   WBC, UA 6-10 (A) 0 - 5 /hpf   RBC, Urine 0-2 0 - 2 /hpf   Epithelial Cells (non renal) 0-10 0 - 10 /hpf   Renal Epithel, UA 0-10 (A) None seen /hpf   Bacteria, UA Moderate (A) None seen/Few  Urinalysis, Routine w reflex microscopic  Result Value Ref Range   Specific Gravity, UA 1.020 1.005 - 1.030   pH, UA 5.5 5.0 - 7.5   Color, UA Yellow Yellow   Appearance Ur Cloudy (A) Clear   Leukocytes,UA 1+ (A) Negative   Protein,UA Negative Negative/Trace   Glucose, UA Negative Negative   Ketones, UA Negative Negative   RBC, UA Trace (A) Negative   Bilirubin, UA Negative Negative   Urobilinogen, Ur 0.2 0.2 - 1.0 mg/dL   Nitrite, UA Negative Negative  Microscopic Examination See below:   Amylase  Result Value Ref Range   Amylase 57 31 - 110 U/L  Anemia Profile B  Result Value Ref Range   Total Iron Binding Capacity 271 250 - 450 ug/dL   UIBC 208 118 - 369 ug/dL   Iron 63 27 - 139 ug/dL   Iron Saturation 23 15 - 55 %   Ferritin 749 (H) 15 - 150 ng/mL   Vitamin B-12 1,454 (H) 232 -  1,245 pg/mL   Folate >20.0 >3.0 ng/mL   WBC 11.0 (H) 3.4 - 10.8 x10E3/uL   RBC 4.54 3.77 - 5.28 x10E6/uL   Hemoglobin 13.2 11.1 - 15.9 g/dL   Hematocrit 39.9 34.0 - 46.6 %   MCV 88 79 - 97 fL   MCH 29.1 26.6 - 33.0 pg   MCHC 33.1 31.5 - 35.7 g/dL   RDW 13.7 11.7 - 15.4 %   Platelets 283 150 - 450 x10E3/uL   Neutrophils 78 Not Estab. %   Lymphs 12 Not Estab. %   Monocytes 7 Not Estab. %   Eos 2 Not Estab. %   Basos 0 Not Estab. %   Neutrophils Absolute 8.6 (H) 1.4 - 7.0 x10E3/uL   Lymphocytes Absolute 1.3 0.7 - 3.1 x10E3/uL   Monocytes Absolute 0.8 0.1 - 0.9 x10E3/uL   EOS (ABSOLUTE) 0.2 0.0 - 0.4 x10E3/uL   Basophils Absolute 0.0 0.0 - 0.2 x10E3/uL   Immature Granulocytes 1 Not Estab. %   Immature Grans (Abs) 0.1 0.0 - 0.1 x10E3/uL   Retic Ct Pct 1.9 0.6 - 2.6 %  CMP14+EGFR  Result Value Ref Range   Glucose 137 (H) 70 - 99 mg/dL   BUN 22 8 - 27 mg/dL   Creatinine, Ser 1.22 (H) 0.57 - 1.00 mg/dL   eGFR 45 (L) >59 mL/min/1.73   BUN/Creatinine Ratio 18 12 - 28   Sodium 138 134 - 144 mmol/L   Potassium 5.0 3.5 - 5.2 mmol/L   Chloride 100 96 - 106 mmol/L   CO2 22 20 - 29 mmol/L   Calcium 9.4 8.7 - 10.3 mg/dL   Total Protein 7.4 6.0 - 8.5 g/dL   Albumin 4.5 3.8 - 4.8 g/dL   Globulin, Total 2.9 1.5 - 4.5 g/dL   Albumin/Globulin Ratio 1.6 1.2 - 2.2   Bilirubin Total 0.6 0.0 - 1.2 mg/dL   Alkaline Phosphatase 211 (H) 44 - 121 IU/L   AST 26 0 - 40 IU/L   ALT 18 0 - 32 IU/L  Lipase  Result Value Ref Range   Lipase 38 14 - 85 U/L       Pertinent labs & imaging results that were available during my care of the patient were reviewed by me and considered in my medical decision making.  Assessment & Plan:  [place] was seen today for abdominal pain.  Diagnoses and all orders for this visit:  Right flank pain Right lower quadrant abdominal pain Persistent and worsening RLQ and right flank pain. History of cancer. Will repeat labs and obtain CT for further evaluation. Further  treatment pending results.  -     CMP14+EGFR -     CBC with Differential/Platelet -     CT Abdomen Pelvis W Contrast; Future     Continue all other maintenance medications.  Follow up plan: Return if symptoms worsen or fail to improve.   Continue healthy lifestyle choices, including diet (rich in fruits, vegetables, and lean proteins, and low in salt and simple carbohydrates)  and exercise (at least 30 minutes of moderate physical activity daily).  Educational handout given for abd pain  The above assessment and management plan was discussed with the patient. The patient verbalized understanding of and has agreed to the management plan. Patient is aware to call the clinic if they develop any new symptoms or if symptoms persist or worsen. Patient is aware when to return to the clinic for a follow-up visit. Patient educated on when it is appropriate to go to the emergency department.   Monia Pouch, FNP-C [facility] [phone]

## 2023-03-18 ENCOUNTER — Telehealth: Payer: Self-pay | Admitting: Family Medicine

## 2023-03-18 LAB — CMP14+EGFR
ALT: 16 IU/L (ref 0–32)
AST: 24 IU/L (ref 0–40)
Albumin/Globulin Ratio: 1.5 (ref 1.2–2.2)
Albumin: 4.5 g/dL (ref 3.8–4.8)
Alkaline Phosphatase: 196 IU/L — ABNORMAL HIGH (ref 44–121)
BUN/Creatinine Ratio: 19 (ref 12–28)
BUN: 24 mg/dL (ref 8–27)
Bilirubin Total: 0.8 mg/dL (ref 0.0–1.2)
CO2: 21 mmol/L (ref 20–29)
Calcium: 9.2 mg/dL (ref 8.7–10.3)
Chloride: 102 mmol/L (ref 96–106)
Creatinine, Ser: 1.26 mg/dL — ABNORMAL HIGH (ref 0.57–1.00)
Globulin, Total: 3 g/dL (ref 1.5–4.5)
Glucose: 109 mg/dL — ABNORMAL HIGH (ref 70–99)
Potassium: 5.1 mmol/L (ref 3.5–5.2)
Sodium: 140 mmol/L (ref 134–144)
Total Protein: 7.5 g/dL (ref 6.0–8.5)
eGFR: 44 mL/min/{1.73_m2} — ABNORMAL LOW (ref >59)

## 2023-03-18 LAB — CBC WITH DIFFERENTIAL/PLATELET
Basophils Absolute: 0 10*3/uL (ref 0.0–0.2)
Basos: 0 %
EOS (ABSOLUTE): 0.2 10*3/uL (ref 0.0–0.4)
Eos: 2 %
Hematocrit: 39.2 % (ref 34.0–46.6)
Hemoglobin: 13 g/dL (ref 11.1–15.9)
Immature Grans (Abs): 0.1 10*3/uL (ref 0.0–0.1)
Immature Granulocytes: 1 %
Lymphocytes Absolute: 1.6 10*3/uL (ref 0.7–3.1)
Lymphs: 14 %
MCH: 29.5 pg (ref 26.6–33.0)
MCHC: 33.2 g/dL (ref 31.5–35.7)
MCV: 89 fL (ref 79–97)
Monocytes Absolute: 0.7 10*3/uL (ref 0.1–0.9)
Monocytes: 7 %
Neutrophils Absolute: 8.4 10*3/uL — ABNORMAL HIGH (ref 1.4–7.0)
Neutrophils: 76 %
Platelets: 306 10*3/uL (ref 150–450)
RBC: 4.41 x10E6/uL (ref 3.77–5.28)
RDW: 14.1 % (ref 11.7–15.4)
WBC: 11.1 10*3/uL — ABNORMAL HIGH (ref 3.4–10.8)

## 2023-03-18 MED ORDER — AZITHROMYCIN 500 MG PO TABS: 500.0000 mg | ORAL_TABLET | Freq: Every day | ORAL | 0 refills | Status: AC

## 2023-03-18 NOTE — Addendum Note (Signed)
Addended by: [name] on: 03/18/2023 12:53 PM   Modules accepted: Orders

## 2023-03-18 NOTE — Telephone Encounter (Signed)
Pt called to get her lab results. Reviewed results with her per providers notes. Pt voiced understanding but said to tell PCP that she already sees Dr Marletta Lor in [place] who is a GI specialist.

## 2023-03-21 ENCOUNTER — Telehealth: Payer: Self-pay

## 2023-03-21 NOTE — Telephone Encounter (Signed)
Noted. We can discuss further at her follow-up. Looks like CT shows wall thickening of her sigmoid colon. Her PCP has her on antibiotics to cover for possible infection.

## 2023-03-21 NOTE — Telephone Encounter (Signed)
Dr. Marletta Lor Pt called wanting to go over her CT Scan with Dr. Marletta Lor. Pt's PCP had it done and they have talked to the pt. They were going to refer her to gastro. She advised them she already has a GI doctor. Please advise   Andrea Martinez Pt is scheduled to see you on 03/23/2023 @ 3pm. Pt advised that she wants this CT to be addressed and that she has several questions regarding this. Like I have mentioned the pt has discussed this with her PCP.

## 2023-03-21 NOTE — Telephone Encounter (Signed)
noted 

## 2023-03-21 NOTE — Progress Notes (Unsigned)
Referring Provider: [name], FNP Primary Care Physician:  [name], FNP Primary GI Physician: Dr. Marletta Lor  No chief complaint on file.   HPI:   Andrea Martinez is a 77 y.o. female  with history of PUD, last EGD July 2023 with nearly healed ulcer and gastric metaplasia on biopsy, no need for surveillance EGD as she has no family history of gastric cancer.  She is presenting today for further evaluation of abnormal CT of the sigmoid colon.  Patient was last seen in our office 02/24/2023.  She reported 1 month of intermittent RLQ abdominal pain without identified trigger.  No association with bowel movements or meals.  Denies BRBPR, melena, urinary symptoms, abnormal vaginal discharge.  She did report chronic back pain but denied association with certain movements.  Abdominal exam was benign.  Queried MSK etiology.  Recommended urinalysis, and consider CT to evaluate symptoms, but patient stated she had follow-up with PCP in the latter part of the month and prefers to discuss further with them.  We also discussed the possibility of getting a colonoscopy next year, but patient states she was not interested unless she developed new problems.  Patient had CT A/P with contrast 03/17/2023 showing long segment mild thickening of the sigmoid colon that may represent mild infectious or inflammatory colitis, numerous diverticula throughout the sigmoid colon although no focally inflamed diverticulum is seen.  Slightly thickened walls of urinary bladder.  Labs 4/4 with WBC 11.1, creatinine 1.26, alk phos 196, otherwise no significant abnormalities.  Today:   Colitis:    Elevated alk phos:  Check iso enzymes/fasting?  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 [name]    Lipoma    left upper arm   Mixed hyperlipidemia    Osteoporosis    Pericardial effusion    a. HCAP complicated by pericardial effusion requiring pericardial window 10/2016 and large L pleural effusion requring VATS.   Pleural effusion    a. s/p VATS 2017.   Pneumonia 10/2016   PONV (postoperative nausea and vomiting)    history of   Prolonged QT interval    Urine incontinence    UTI (lower urinary tract infection)    frequent   Vitamin D deficiency     Past Surgical History:  Procedure Laterality Date   BIOPSY  03/20/2022   Procedure: BIOPSY;  Surgeon: Lanelle Bal, DO;  [facility]: [facility];  Service: Endoscopy;;  gastric lesion vs. gastric ulcer   BIOPSY  06/28/2022   Procedure: BIOPSY;  Surgeon: Lanelle Bal, DO;  [facility]: [facility];  Service: Endoscopy;;   BRAVO PH STUDY  12/04/2012   Procedure: BRAVO PH STUDY;  Surgeon: West Bali, MD;  Location: [facility];  Service: Endoscopy;  Laterality: N/A;   CHOLECYSTECTOMY N/A 01/03/2014   Procedure: LAPAROSCOPIC CHOLECYSTECTOMY WITH INTRAOPERATIVE CHOLANGIOGRAM;  Surgeon: Clovis Pu. Cornett, MD;  Location: [facility];  Service: General;  Laterality: N/A;   COLONOSCOPY  01/2004   DR [facility], few small tics   ESOPHAGOGASTRODUODENOSCOPY  02/2010   gastric ulcers   ESOPHAGOGASTRODUODENOSCOPY  12/04/2012   [id number] gastritis (inflammation) was found in the gastric antrum; multiple biopsies The duodenal mucosa showed no abnormalities  in the bulb and second portion of the duodenum   ESOPHAGOGASTRODUODENOSCOPY (EGD) WITH PROPOFOL N/A 03/20/2022   Procedure: ESOPHAGOGASTRODUODENOSCOPY (EGD) WITH PROPOFOL;  Surgeon: Lanelle Bal, DO;  [facility];  Service: Endoscopy;  Laterality: N/A;   ESOPHAGOGASTRODUODENOSCOPY (EGD) WITH PROPOFOL N/A 06/28/2022   Procedure: ESOPHAGOGASTRODUODENOSCOPY (EGD) WITH PROPOFOL;  Surgeon: Lanelle Bal, DO;  [facility]: [facility];  Service: Endoscopy;   Laterality: N/A;  800   JOINT REPLACEMENT Right 10/22/2019   KYPHOPLASTY N/A 02/14/2019   Procedure: KYPHOPLASTY T12 and L1;  Surgeon: [name], MD;  Location: [facility];  Service: Orthopedics;  Laterality: N/A;  120 mins   LIPOMA EXCISION  08/02/2011   left shoulder   NOSE SURGERY     PARTIAL HYSTERECTOMY     vaginal at age 55 years of age   TOE DEBRIDEMENT Left 1962   lt great toe   TOTAL HIP ARTHROPLASTY Right 11/18/2021   Procedure: TOTAL HIP ARTHROPLASTY ANTERIOR APPROACH;  Surgeon: Ollen Gross, MD;  Location: [facility];  Service: Orthopedics;  Laterality: Right;   TOTAL KNEE ARTHROPLASTY Right 10/22/2019   Procedure: TOTAL KNEE ARTHROPLASTY;  Surgeon: Ollen Gross, MD;  Location: [facility];  Service: Orthopedics;  Laterality: Right;    TOTAL KNEE ARTHROPLASTY Left 03/03/2020   Procedure: TOTAL KNEE ARTHROPLASTY;  Surgeon: Ollen Gross, MD;  Location: [facility];  Service: Orthopedics;  Laterality: Left;    TRANSFORAMINAL LUMBAR INTERBODY FUSION (TLIF) WITH PEDICLE SCREW FIXATION 1 LEVEL N/A 01/28/2021   Procedure: TRANSFORAMINAL LUMBAR INTERBODY FUSION (TLIF) LUMBAR FIVE-SACRAL ONE;  Surgeon: [name], MD;  Location: [facility];  Service: Orthopedics;  Laterality: N/A;  4 hrs   VIDEO ASSISTED THORACOSCOPY (VATS)/EMPYEMA Left 10/25/2016   Procedure: VIDEO ASSISTED THORACOSCOPY (VATS), BRONCH,DRAINAGE OF PLEURAL EFFUSION,PERICARDIAL WINDOW WITH DRAINAGE OF PERICARDIAL FLUID, TEE;  Surgeon: Loreli Slot, MD;  Location: [facility];  Service: Thoracic;  Laterality: Left;   VIDEO BRONCHOSCOPY N/A 10/25/2016   Procedure: VIDEO BRONCHOSCOPY;  Surgeon: Loreli Slot, MD;  Location: [facility];  Service: Thoracic;  Laterality: N/A;    Current Outpatient Medications  Medication Sig Dispense Refill   acetaminophen (TYLENOL) 500 MG tablet Take 1,000 mg by mouth every 8 (eight) hours as needed for moderate pain.     azithromycin (ZITHROMAX) 500 MG tablet Take 1 tablet (500 mg total) by  mouth daily for 3 days. 3 tablet 0   cetirizine (ZYRTEC) 10 MG tablet Take 10 mg by mouth daily.     Cholecalciferol (VITAMIN D3) 50 MCG (2000 UT) TABS Take 2,000 Units by mouth daily in the afternoon.     citalopram (CELEXA) 20 MG tablet Take 1 tablet (20 mg total) by mouth daily. 90 tablet 1   lactose free nutrition (BOOST) LIQD Take 237 mLs by mouth daily.     levothyroxine (SYNTHROID) 88 MCG tablet TAKE 1 TABLET BY MOUTH DAILY BEFORE BREAKFAST. 90 tablet 1   Multiple Vitamins-Minerals (CENTRUM SILVER PO) Take 1 tablet by mouth daily in the afternoon.     nilotinib (TASIGNA) 150 MG capsule Take 2 capsules (300 mg total) by mouth every 12 (twelve) hours. 120 capsule 12   ondansetron (ZOFRAN) 4 MG tablet Take 1 tablet (4 mg total) by mouth every 8 (eight) hours as needed for nausea or vomiting. 20 tablet 0   Probiotic Product (PROBIOTIC DAILY PO) Take 2 each by mouth daily in the afternoon.     vitamin B-12 (CYANOCOBALAMIN) 1000 MCG tablet Take 1,000 mcg by  mouth daily.     No current facility-administered medications for this visit.    Allergies as of 03/23/2023 - Review Complete 03/17/2023  Allergen Reaction Noted   Nucynta [tapentadol hcl] Swelling 12/31/2020   Codeine Nausea Only and Other (See Comments) 02/11/2010   Sulfonamide derivatives Nausea Only and Other (See Comments) 02/11/2010   Nexium [esomeprazole magnesium] Nausea Only 06/23/2022   Sulfa antibiotics Nausea Only and Other (See Comments) 06/22/2022   Fluconazole Rash and Other (See Comments) 12/10/2015    Family History  Problem Relation Age of Onset   Lung cancer Father    COPD Mother    Stroke Mother    Thyroid disease Mother    Colon cancer Neg Hx    Anesthesia problems Neg Hx    Hypotension Neg Hx    Malignant hyperthermia Neg Hx    Pseudochol deficiency Neg Hx     Social History   Socioeconomic History   Marital status: Married    Spouse name: Greggory StallionGeorge   Number of children: 2   Years of education: 12th  grade   Highest education level: Not on file  Occupational History   Occupation: retired    Associate Professormployer: UENPLOYED    Employer: RETIRED  Tobacco Use   Smoking status: Never   Smokeless tobacco: Never  Vaping Use   Vaping Use: Never used  Substance and Sexual Activity   Alcohol use: No    Alcohol/week: 0.0 standard drinks of alcohol   Drug use: No   Sexual activity: Yes    Partners: Male    Birth control/protection: None  Other Topics Concern   Not on file  Social History Narrative   1 son, 1 daughter.   2 granddaughters.   Retired HR.    Social Determinants of Health   Financial Resource Strain: Low Risk  (05/17/2022)   Overall Financial Resource Strain (CARDIA)    Difficulty of Paying Living Expenses: Not hard at all  Food Insecurity: No Food Insecurity (05/17/2022)   Hunger Vital Sign    Worried About Running Out of Food in the Last Year: Never true    Ran Out of Food in the Last Year: Never true  Transportation Needs: No Transportation Needs (05/17/2022)   PRAPARE - Administrator, Civil ServiceTransportation    Lack of Transportation (Medical): No    Lack of Transportation (Non-Medical): No  Physical Activity: Sufficiently Active (05/17/2022)   Exercise Vital Sign    Days of Exercise per Week: 5 days    Minutes of Exercise per Session: 30 min  Stress: No Stress Concern Present (05/17/2022)   [employer] - Occupational Stress Questionnaire    Feeling of Stress : Not at all  Social Connections: Socially Integrated (05/17/2022)   Social Connection and Isolation Panel [NHANES]    Frequency of Communication with Friends and Family: More than three times a week    Frequency of Social Gatherings with Friends and Family: More than three times a week    Attends Religious Services: 1 to 4 times per year    Active Member of [employer]ubs or Organizations: Yes    Attends BankerClub or Organization Meetings: Never    Marital Status: Married    Review of Systems: Gen: Denies fever, chills, anorexia.  Denies fatigue, weakness, weight loss.  CV: Denies chest pain, palpitations, syncope, peripheral edema, and claudication. Resp: Denies dyspnea at rest, cough, wheezing, coughing up blood, and pleurisy. GI: Denies vomiting blood, jaundice, and fecal incontinence.   Denies dysphagia or odynophagia. Derm:  Denies rash, itching, dry skin Psych: Denies depression, anxiety, memory loss, confusion. No homicidal or suicidal ideation.  Heme: Denies bruising, bleeding, and enlarged lymph nodes.  Physical Exam: There were no vitals taken for this visit. General:   Alert and oriented. No distress noted. Pleasant and cooperative.  Head:  Normocephalic and atraumatic. Eyes:  Conjuctiva clear without scleral icterus. Heart:  S1, S2 present without murmurs appreciated. Lungs:  Clear to auscultation bilaterally. No wheezes, rales, or rhonchi. No distress.  Abdomen:  +BS, soft, non-tender and non-distended. No rebound or guarding. No HSM or masses noted. Msk:  Symmetrical without gross deformities. Normal posture. Extremities:  Without edema. Neurologic:  Alert and  oriented x4 Psych:  Normal mood and affect.    Assessment:     Plan:  ***   Ermalinda Memos, PA-C [facility] 03/23/2023

## 2023-03-21 NOTE — Telephone Encounter (Signed)
Returned the pt's call from my vm and advised the pt that I had already contacted Dr Marletta Lor and the provider she is seeing Wednesday, April 10th @ 3:00 pm which is Ermalinda Memos and the results will be addressed then. The pt expressed understanding.

## 2023-03-23 ENCOUNTER — Ambulatory Visit (INDEPENDENT_AMBULATORY_CARE_PROVIDER_SITE_OTHER): Payer: PPO | Admitting: Gastroenterology

## 2023-03-23 ENCOUNTER — Encounter: Payer: Self-pay | Admitting: Gastroenterology

## 2023-03-23 VITALS — BP 137/80 | HR 86 | Temp 97.6°F | Ht 65.0 in | Wt 216.4 lb

## 2023-03-23 DIAGNOSIS — R748 Abnormal levels of other serum enzymes: Secondary | ICD-10-CM | POA: Diagnosis not present

## 2023-03-23 DIAGNOSIS — K529 Noninfective gastroenteritis and colitis, unspecified: Secondary | ICD-10-CM

## 2023-03-23 MED ORDER — METRONIDAZOLE 500 MG PO TABS
500.0000 mg | ORAL_TABLET | Freq: Three times a day (TID) | ORAL | 0 refills | Status: AC
Start: 2023-03-23 — End: 2023-03-30

## 2023-03-23 MED ORDER — CIPROFLOXACIN HCL 500 MG PO TABS
500.0000 mg | ORAL_TABLET | Freq: Two times a day (BID) | ORAL | 0 refills | Status: AC
Start: 2023-03-23 — End: 2023-03-30

## 2023-03-23 NOTE — Patient Instructions (Signed)
Start ciprofloxacin twice daily and flagyl 3 times daily for 7 days for colitis.   Have blood work completed at [facility] to evaluate elevated alkaline phosphatase. Please complete this fasting.   We will follow-up in 4 weeks.   Ermalinda Memos, PA-C [facility]

## 2023-03-25 NOTE — Telephone Encounter (Signed)
Patient has appointment with GI on 04/20/23

## 2023-03-28 ENCOUNTER — Other Ambulatory Visit: Payer: Self-pay | Admitting: Gastroenterology

## 2023-03-28 ENCOUNTER — Telehealth: Payer: Self-pay | Admitting: *Deleted

## 2023-03-28 DIAGNOSIS — K1379 Other lesions of oral mucosa: Secondary | ICD-10-CM

## 2023-03-28 DIAGNOSIS — K2289 Other specified disease of esophagus: Secondary | ICD-10-CM

## 2023-03-28 MED ORDER — NYSTATIN 100000 UNIT/ML MT SUSP
10.0000 mL | Freq: Three times a day (TID) | OROMUCOSAL | 0 refills | Status: AC
Start: 2023-03-28 — End: 2023-04-11

## 2023-03-28 NOTE — Telephone Encounter (Signed)
Can she come in tomorrow to be seen? She may have thrush secondary to antibiotics, but would like for someone to be able to look at her mouth to ensure nothing else is going on. She can see any APP. If she is able, please schedule her to be seen tomorrow.

## 2023-03-28 NOTE — Telephone Encounter (Signed)
Noted  

## 2023-03-28 NOTE — Telephone Encounter (Signed)
Pt called and states she was giving 2 antibiotics last week and they have made her have diarrhea and her mouth and tongue raw and sore. She states she can't eat anything. She has been drinking water and Gatorade. She did not take medications today.

## 2023-03-28 NOTE — Telephone Encounter (Signed)
Spoke with patient.  She reports since starting the antibiotics, she has been having diarrhea, typically 3 loose bowel movements per day.  She is also having burning in her mouth and down her esophagus.  No nausea, vomiting, abdominal pain.  For now, I recommended stopping antibiotics and starting Magic mouthwash 3 times daily for the next 14 days.  She is advised to monitor her bowel movements and let us know if she continues to have diarrhea after stopping antibiotics.  We will get her back in the office in a couple of weeks.   Mandy: Please arrange for follow-up in 2 weeks.

## 2023-03-31 DIAGNOSIS — R748 Abnormal levels of other serum enzymes: Secondary | ICD-10-CM | POA: Diagnosis not present

## 2023-04-01 LAB — ALKALINE PHOSPHATASE, ISOENZYMES

## 2023-04-05 LAB — ALKALINE PHOSPHATASE, ISOENZYMES
Alkaline Phosphatase: 192 IU/L — ABNORMAL HIGH (ref 44–121)
BONE FRACTION: 28 % (ref 14–68)

## 2023-04-07 ENCOUNTER — Other Ambulatory Visit: Payer: Self-pay | Admitting: Family Medicine

## 2023-04-07 DIAGNOSIS — F320 Major depressive disorder, single episode, mild: Secondary | ICD-10-CM

## 2023-04-07 DIAGNOSIS — F419 Anxiety disorder, unspecified: Secondary | ICD-10-CM

## 2023-04-10 NOTE — Progress Notes (Deleted)
Referring Provider: [name], FNP Primary Care Physician:  [name], FNP Primary GI Physician: Dr. Marletta Lor  No chief complaint on file.   HPI:   Andrea Martinez is a 77 y.o. female  with history of PUD, last EGD July 2023 with nearly healed ulcer and gastric metaplasia on biopsy, no need for surveillance EGD as she has no family history of gastric cancer.  She is presenting today for follow-up of abnormal CT of sigmoid colon.  CT A/P with contrast 03/17/2023 ordered by PCP showing long segment mild thickening of the sigmoid colon that may represent mild infectious or inflammatory colitis, numerous diverticula throughout the sigmoid colon although no focally inflamed diverticulum is seen. Slightly thickened walls of urinary bladder.   Labs 4/4 with WBC 11.1, creatinine 1.26, alk phos 196, otherwise no significant abnormalities.   PCP prescribed azithromycin x 3 days.  I saw her in the office on 03/23/2023.  She continued to have right-sided abdominal pain radiating to suprapubic area without identified trigger.  Denied association with meals, bowel movements.  Denied constipation, diarrhea, BRBPR, melena.  Noted intermittent nausea without vomiting at random for the last couple of weeks.  Etiology of bowel wall thickening was unclear, and not sure if this was the cause of her RLQ pain.  Her abdominal exam was benign.  However, due to persistent pain and CT findings, recommended completing course of Cipro and Flagyl, after this would pursue colonoscopy.  Other etiology of her abdominal pain suspected to be MSK in etiology.  Also noted chronic elevated alkaline phosphatase.  Planned to check isoenzymes.  Patient called 03/28/2023 reporting diarrhea, burning in her mouth and esophagus.  Recommended office visit, but patient does not feel that she could come in.  Recommended stopping antibiotics, starting Magic mouthwash 3 times daily x 14 days, monitor bowel function and let us know if  persistent diarrhea, office visit in 2 weeks.  Labs completed showing alkaline phosphatase 192, liver fraction 72%, bone fraction 28%.  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 [name]   Lipoma    left upper arm   Mixed hyperlipidemia    Osteoporosis    Pericardial effusion    a. HCAP complicated by pericardial effusion requiring pericardial window 10/2016 and large L pleural effusion requring VATS.   Pleural effusion    a. s/p VATS 2017.   Pneumonia 10/2016   PONV (postoperative nausea and vomiting)    history of   Prolonged QT interval    Urine incontinence    UTI (lower urinary tract infection)    frequent   Vitamin D deficiency     Past Surgical History:  Procedure Laterality Date   BIOPSY  03/20/2022   Procedure: BIOPSY;  Surgeon: Lanelle Bal, DO;  Location: [facility];  Service: Endoscopy;;  gastric lesion vs. gastric ulcer   BIOPSY  06/28/2022   Procedure: BIOPSY;  Surgeon: Lanelle Bal, DO;  [facility]: [facility];  Service: Endoscopy;;   BRAVO PH STUDY  12/04/2012   Procedure: BRAVO PH STUDY;  Surgeon: West Bali, MD;  Location: [facility];  Service: Endoscopy;  Laterality: N/A;   CHOLECYSTECTOMY N/A 01/03/2014  Procedure: LAPAROSCOPIC CHOLECYSTECTOMY WITH INTRAOPERATIVE CHOLANGIOGRAM;  Surgeon: Clovis Pu. Cornett, MD;  Location: [facility];  Service: General;  Laterality: N/A;   COLONOSCOPY  01/2004   DR [facility], few small tics   ESOPHAGOGASTRODUODENOSCOPY  02/2010   gastric ulcers   ESOPHAGOGASTRODUODENOSCOPY  12/04/2012   [id number] gastritis (inflammation) was found in the gastric antrum; multiple biopsies The duodenal mucosa showed no abnormalities in the bulb and second portion of the duodenum    ESOPHAGOGASTRODUODENOSCOPY (EGD) WITH PROPOFOL N/A 03/20/2022   Procedure: ESOPHAGOGASTRODUODENOSCOPY (EGD) WITH PROPOFOL;  Surgeon: Lanelle Bal, DO;  [facility];  Service: Endoscopy;  Laterality: N/A;   ESOPHAGOGASTRODUODENOSCOPY (EGD) WITH PROPOFOL N/A 06/28/2022   Procedure: ESOPHAGOGASTRODUODENOSCOPY (EGD) WITH PROPOFOL;  Surgeon: Lanelle Bal, DO;  [facility]: [facility];  Service: Endoscopy;  Laterality: N/A;  800   JOINT REPLACEMENT Right 10/22/2019   KYPHOPLASTY N/A 02/14/2019   Procedure: KYPHOPLASTY T12 and L1;  Surgeon: [name], MD;  Location: [facility];  Service: Orthopedics;  Laterality: N/A;  120 mins   LIPOMA EXCISION  08/02/2011   left shoulder   NOSE SURGERY     PARTIAL HYSTERECTOMY     vaginal at age 31 years of age   TOE DEBRIDEMENT Left 1962   lt great toe   TOTAL HIP ARTHROPLASTY Right 11/18/2021   Procedure: TOTAL HIP ARTHROPLASTY ANTERIOR APPROACH;  Surgeon: Ollen Gross, MD;  Location: [facility];  Service: Orthopedics;  Laterality: Right;   TOTAL KNEE ARTHROPLASTY Right 10/22/2019   Procedure: TOTAL KNEE ARTHROPLASTY;  Surgeon: Ollen Gross, MD;  Location: [facility];  Service: Orthopedics;  Laterality: Right;    TOTAL KNEE ARTHROPLASTY Left 03/03/2020   Procedure: TOTAL KNEE ARTHROPLASTY;  Surgeon: Ollen Gross, MD;  Location: [facility];  Service: Orthopedics;  Laterality: Left;    TRANSFORAMINAL LUMBAR INTERBODY FUSION (TLIF) WITH PEDICLE SCREW FIXATION 1 LEVEL N/A 01/28/2021   Procedure: TRANSFORAMINAL LUMBAR INTERBODY FUSION (TLIF) LUMBAR FIVE-SACRAL ONE;  Surgeon: [name], MD;  Location: [facility];  Service: Orthopedics;  Laterality: N/A;  4 hrs   VIDEO ASSISTED THORACOSCOPY (VATS)/EMPYEMA Left 10/25/2016   Procedure: VIDEO ASSISTED THORACOSCOPY (VATS), BRONCH,DRAINAGE OF PLEURAL EFFUSION,PERICARDIAL WINDOW WITH DRAINAGE OF PERICARDIAL FLUID, TEE;  Surgeon: Loreli Slot, MD;  Location: [facility];  Service: Thoracic;  Laterality:  Left;   VIDEO BRONCHOSCOPY N/A 10/25/2016   Procedure: VIDEO BRONCHOSCOPY;  Surgeon: Loreli Slot, MD;  Location: [facility];  Service: Thoracic;  Laterality: N/A;    Current Outpatient Medications  Medication Sig Dispense Refill   acetaminophen (TYLENOL) 500 MG tablet Take 1,000 mg by mouth every 8 (eight) hours as needed for moderate pain.     cetirizine (ZYRTEC) 10 MG tablet Take 10 mg by mouth daily.     Cholecalciferol (VITAMIN D3) 50 MCG (2000 UT) TABS Take 2,000 Units by mouth daily in the afternoon.     citalopram (CELEXA) 20 MG tablet TAKE 1 TABLET BY MOUTH EVERY DAY 90 tablet 1   lactose free nutrition (BOOST) LIQD Take 237 mLs by mouth daily.     levothyroxine (SYNTHROID) 88 MCG tablet TAKE 1 TABLET BY MOUTH DAILY BEFORE BREAKFAST. 90 tablet 1   magic mouthwash (nystatin, lidocaine, diphenhydrAMINE, alum & mag hydroxide) suspension Swish and swallow 10 mLs 3 (three) times daily for 14 days. 420 mL 0   Multiple Vitamins-Minerals (CENTRUM SILVER PO) Take 1 tablet by mouth daily in the afternoon.     nilotinib (TASIGNA) 150 MG capsule Take  2 capsules (300 mg total) by mouth every 12 (twelve) hours. 120 capsule 12   ondansetron (ZOFRAN) 4 MG tablet Take 1 tablet (4 mg total) by mouth every 8 (eight) hours as needed for nausea or vomiting. 20 tablet 0   Probiotic Product (PROBIOTIC DAILY PO) Take 2 each by mouth daily in the afternoon.     vitamin B-12 (CYANOCOBALAMIN) 1000 MCG tablet Take 1,000 mcg by mouth daily.     No current facility-administered medications for this visit.    Allergies as of 04/13/2023 - Review Complete 03/23/2023  Allergen Reaction Noted   Nucynta [tapentadol hcl] Swelling 12/31/2020   Codeine Nausea Only and Other (See Comments) 02/11/2010   Sulfonamide derivatives Nausea Only and Other (See Comments) 02/11/2010   Nexium [esomeprazole magnesium] Nausea Only 06/23/2022   Sulfa antibiotics Nausea Only and Other (See Comments) 06/22/2022   Fluconazole Rash  and Other (See Comments) 12/10/2015    Family History  Problem Relation Age of Onset   Lung cancer Father    COPD Mother    Stroke Mother    Thyroid disease Mother    Colon cancer Neg Hx    Anesthesia problems Neg Hx    Hypotension Neg Hx    Malignant hyperthermia Neg Hx    Pseudochol deficiency Neg Hx     Social History   Socioeconomic History   Marital status: Married    Spouse name: Greggory Stallion   Number of children: 2   Years of education: 12th grade   Highest education level: Not on file  Occupational History   Occupation: retired    Associate Professor: [employer]    Employer: RETIRED  Tobacco Use   Smoking status: Never   Smokeless tobacco: Never  Vaping Use   Vaping Use: Never used  Substance and Sexual Activity   Alcohol use: No    Alcohol/week: 0.0 standard drinks of alcohol   Drug use: No   Sexual activity: Yes    Partners: Male    Birth control/protection: None  Other Topics Concern   Not on file  Social History Narrative   1 son, 1 daughter.   2 granddaughters.   Retired HR.    Social Determinants of Health   Financial Resource Strain: Low Risk  (05/17/2022)   Overall Financial Resource Strain (CARDIA)    Difficulty of Paying Living Expenses: Not hard at all  Food Insecurity: No Food Insecurity (05/17/2022)   Hunger Vital Sign    Worried About Running Out of Food in the Last Year: Never true    Ran Out of Food in the Last Year: Never true  Transportation Needs: No Transportation Needs (05/17/2022)   PRAPARE - Administrator, Civil Service (Medical): No    Lack of Transportation (Non-Medical): No  Physical Activity: Sufficiently Active (05/17/2022)   Exercise Vital Sign    Days of Exercise per Week: 5 days    Minutes of Exercise per Session: 30 min  Stress: No Stress Concern Present (05/17/2022)   [employer] of Occupational Health - Occupational Stress Questionnaire    Feeling of Stress : Not at all  Social Connections: Socially Integrated  (05/17/2022)   Social Connection and Isolation Panel [NHANES]    Frequency of Communication with Friends and Family: More than three times a week    Frequency of Social Gatherings with Friends and Family: More than three times a week    Attends Religious Services: 1 to 4 times per year    Active Member of [employer]  or Organizations: Yes    Attends Banker Meetings: Never    Marital Status: Married    Review of Systems: Gen: Denies fever, chills, anorexia. Denies fatigue, weakness, weight loss.  CV: Denies chest pain, palpitations, syncope, peripheral edema, and claudication. Resp: Denies dyspnea at rest, cough, wheezing, coughing up blood, and pleurisy. GI: Denies vomiting blood, jaundice, and fecal incontinence.   Denies dysphagia or odynophagia. Derm: Denies rash, itching, dry skin Psych: Denies depression, anxiety, memory loss, confusion. No homicidal or suicidal ideation.  Heme: Denies bruising, bleeding, and enlarged lymph nodes.  Physical Exam: There were no vitals taken for this visit. General:   Alert and oriented. No distress noted. Pleasant and cooperative.  Head:  Normocephalic and atraumatic. Eyes:  Conjuctiva clear without scleral icterus. Heart:  S1, S2 present without murmurs appreciated. Lungs:  Clear to auscultation bilaterally. No wheezes, rales, or rhonchi. No distress.  Abdomen:  +BS, soft, non-tender and non-distended. No rebound or guarding. No HSM or masses noted. Msk:  Symmetrical without gross deformities. Normal posture. Extremities:  Without edema. Neurologic:  Alert and  oriented x4 Psych:  Normal mood and affect.    Assessment:     Plan:  ***   Ermalinda Memos, PA-C [facility] 04/13/2023

## 2023-04-13 ENCOUNTER — Encounter: Payer: Self-pay | Admitting: *Deleted

## 2023-04-13 ENCOUNTER — Encounter: Payer: Self-pay | Admitting: Internal Medicine

## 2023-04-13 ENCOUNTER — Ambulatory Visit (INDEPENDENT_AMBULATORY_CARE_PROVIDER_SITE_OTHER): Payer: PPO | Admitting: Internal Medicine

## 2023-04-13 ENCOUNTER — Ambulatory Visit: Payer: PPO | Admitting: Gastroenterology

## 2023-04-13 VITALS — BP 119/71 | HR 85 | Temp 98.1°F | Ht 65.0 in | Wt 217.2 lb

## 2023-04-13 DIAGNOSIS — R748 Abnormal levels of other serum enzymes: Secondary | ICD-10-CM

## 2023-04-13 DIAGNOSIS — R1013 Epigastric pain: Secondary | ICD-10-CM

## 2023-04-13 DIAGNOSIS — K529 Noninfective gastroenteritis and colitis, unspecified: Secondary | ICD-10-CM

## 2023-04-13 DIAGNOSIS — Z8711 Personal history of peptic ulcer disease: Secondary | ICD-10-CM

## 2023-04-13 DIAGNOSIS — R1031 Right lower quadrant pain: Secondary | ICD-10-CM | POA: Diagnosis not present

## 2023-04-13 NOTE — Patient Instructions (Signed)
I am going to order blood work at [facility] to further evaluate your elevated alk phos.  We will call with results.  We will schedule you for upper endoscopy and colonoscopy to evaluate your abdominal pain, history of ulcer in your stomach, recent diagnosis of colitis.  It was very nice seeing you both again today.  Dr. Marletta Lor

## 2023-04-13 NOTE — Progress Notes (Signed)
Referring Provider: [name], FNP Primary Care Physician:  [name], FNP Primary GI:  Dr. Marletta Lor  Chief Complaint  Patient presents with   Abdominal Pain    Having pain in right lower side. Pcp ordered ct scan and pt saw Mosetta Pigeon and was given cipro and flagyl. Pt took med for a little over one week. Stopped due to having diarrhea and burning in her mouth. Was given magic mouthwash. States diarrhea and burning in her mouth is better.     HPI:   Andrea Martinez is a 77 y.o. female who presents to the clinic for follow up visit. She has a history of peptic ulcer disease, gastric intestinal metaplasia, chronic abdominal pain, CML, arthritis, bladder issues, depression.   EGD 03/20/2022 with 15 mm ulcer at the pylorus, nodular mucosa, biopsies negative for H. pylori.  Started on twice daily PPI.  Repeat EGD 06/28/2022 showed nearly healed ulcer.  Repeat biopsy showed gastric intestinal metaplasia.  No family history of gastric malignancy.  Has chronic right lower quadrant abdominal pain. CT A/P with contrast 03/17/2023 showing long segment mild thickening of the sigmoid colon that may represent mild infectious or inflammatory colitis, numerous diverticula throughout the sigmoid colon although no focally inflamed diverticulum is seen. Slightly thickened walls of urinary bladder.   Completed 3-day course of azithromycin.  Continue to have issues was placed on Cipro and Flagyl which caused diarrhea as well as thrush.  Given Magic mouthwash.  Also with chronically elevated alkaline phosphatase.  Denies any family history of liver disease.  Does note chronic itching related to allergies which is well-controlled on cetirizine.  Status post cholecystectomy.  CT imaging showed mild biliary dilatation likely related to postcholecystectomy changes.  Today, states her bowels have returned to normal.  No longer having diarrhea.  Her thrush is also resolved with Magic mouthwash.  She is quite  tearful for me today.  States she has so much going on in her life including her leukemia, arthritis, bladder issues, issues with depression and stress as well as her chronic abdominal pain.  Abdominal pain sporadic, severe at times, only last a few minutes.  Not related to bowels.  States her bowels are moving well.  No melena hematochezia.  Last colonoscopy 2015 unremarkable.  Does have resurgence of epigastric abdominal pain.  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 [name]   Lipoma    left upper arm   Mixed hyperlipidemia    Osteoporosis    Pericardial effusion    a. HCAP complicated by pericardial effusion requiring pericardial window 10/2016 and large L pleural effusion requring VATS.   Pleural effusion    a. s/p VATS 2017.   Pneumonia 10/2016   PONV (postoperative nausea and vomiting)    history of   Prolonged QT interval    Urine incontinence    UTI (lower urinary tract infection)    frequent   Vitamin D deficiency     Past Surgical History:  Procedure Laterality Date   BIOPSY  03/20/2022   Procedure: BIOPSY;  Surgeon: Lanelle Bal, DO;  Location: [facility];  Service: Endoscopy;;  gastric lesion vs. gastric ulcer   BIOPSY  06/28/2022  Procedure: BIOPSY;  Surgeon: Lanelle Bal, DO;  Location: [facility];  Service: Endoscopy;;   BRAVO PH STUDY  12/04/2012   Procedure: BRAVO PH STUDY;  Surgeon: West Bali, MD;  Location: [facility];  Service: Endoscopy;  Laterality: N/A;   CHOLECYSTECTOMY N/A 01/03/2014   Procedure: LAPAROSCOPIC CHOLECYSTECTOMY WITH INTRAOPERATIVE CHOLANGIOGRAM;  Surgeon: Clovis Pu. Cornett, MD;  Location: [facility];  Service: General;  Laterality: N/A;   COLONOSCOPY  01/2004   [facility], few  small tics   ESOPHAGOGASTRODUODENOSCOPY  02/2010   gastric ulcers   ESOPHAGOGASTRODUODENOSCOPY  12/04/2012   [id number] gastritis (inflammation) was found in the gastric antrum; multiple biopsies The duodenal mucosa showed no abnormalities in the bulb and second portion of the duodenum   ESOPHAGOGASTRODUODENOSCOPY (EGD) WITH PROPOFOL N/A 03/20/2022   Procedure: ESOPHAGOGASTRODUODENOSCOPY (EGD) WITH PROPOFOL;  Surgeon: Lanelle Bal, DO;  [facility]: [facility];  Service: Endoscopy;  Laterality: N/A;   ESOPHAGOGASTRODUODENOSCOPY (EGD) WITH PROPOFOL N/A 06/28/2022   Procedure: ESOPHAGOGASTRODUODENOSCOPY (EGD) WITH PROPOFOL;  Surgeon: Lanelle Bal, DO;  Location: [facility];  Service: Endoscopy;  Laterality: N/A;  800   JOINT REPLACEMENT Right 10/22/2019   KYPHOPLASTY N/A 02/14/2019   Procedure: KYPHOPLASTY T12 and L1;  Surgeon: [name], MD;  Location: [facility];  Service: Orthopedics;  Laterality: N/A;  120 mins   LIPOMA EXCISION  08/02/2011   left shoulder   NOSE SURGERY     PARTIAL HYSTERECTOMY     vaginal at age 5 years of age   TOE DEBRIDEMENT Left 1962   lt great toe   TOTAL HIP ARTHROPLASTY Right 11/18/2021   Procedure: TOTAL HIP ARTHROPLASTY ANTERIOR APPROACH;  Surgeon: Ollen Gross, MD;  Location: [facility];  Service: Orthopedics;  Laterality: Right;   TOTAL KNEE ARTHROPLASTY Right 10/22/2019   Procedure: TOTAL KNEE ARTHROPLASTY;  Surgeon: Ollen Gross, MD;  Location: [facility];  Service: Orthopedics;  Laterality: Right;    TOTAL KNEE ARTHROPLASTY Left 03/03/2020   Procedure: TOTAL KNEE ARTHROPLASTY;  Surgeon: Ollen Gross, MD;  Location: [facility];  Service: Orthopedics;  Laterality: Left;    TRANSFORAMINAL LUMBAR INTERBODY FUSION (TLIF) WITH PEDICLE SCREW FIXATION 1 LEVEL N/A 01/28/2021   Procedure: TRANSFORAMINAL LUMBAR INTERBODY FUSION (TLIF) LUMBAR FIVE-SACRAL ONE;  Surgeon: [name], MD;  Location: [facility];  Service: Orthopedics;  Laterality: N/A;  4 hrs    VIDEO ASSISTED THORACOSCOPY (VATS)/EMPYEMA Left 10/25/2016   Procedure: VIDEO ASSISTED THORACOSCOPY (VATS), BRONCH,DRAINAGE OF PLEURAL EFFUSION,PERICARDIAL WINDOW WITH DRAINAGE OF PERICARDIAL FLUID, TEE;  Surgeon: Loreli Slot, MD;  Location: [facility];  Service: Thoracic;  Laterality: Left;   VIDEO BRONCHOSCOPY N/A 10/25/2016   Procedure: VIDEO BRONCHOSCOPY;  Surgeon: Loreli Slot, MD;  Location: [facility];  Service: Thoracic;  Laterality: N/A;    Current Outpatient Medications  Medication Sig Dispense Refill   acetaminophen (TYLENOL) 500 MG tablet Take 1,000 mg by mouth every 8 (eight) hours as needed for moderate pain.     cetirizine (ZYRTEC) 10 MG tablet Take 10 mg by mouth daily.     Cholecalciferol (VITAMIN D3) 50 MCG (2000 UT) TABS Take 2,000 Units by mouth daily in the afternoon.     citalopram (CELEXA) 20 MG tablet TAKE 1 TABLET BY MOUTH EVERY DAY 90 tablet 1   lactose free nutrition (BOOST) LIQD Take 237 mLs by mouth daily.     levothyroxine (SYNTHROID) 88 MCG tablet TAKE 1 TABLET BY MOUTH DAILY BEFORE BREAKFAST. 90 tablet 1  Multiple Vitamins-Minerals (CENTRUM SILVER PO) Take 1 tablet by mouth daily in the afternoon.     nilotinib (TASIGNA) 150 MG capsule Take 2 capsules (300 mg total) by mouth every 12 (twelve) hours. 120 capsule 12   Probiotic Product (PROBIOTIC DAILY PO) Take 2 each by mouth daily in the afternoon.     vitamin B-12 (CYANOCOBALAMIN) 1000 MCG tablet Take 1,000 mcg by mouth daily.     No current facility-administered medications for this visit.    Allergies as of 04/13/2023 - Review Complete 04/13/2023  Allergen Reaction Noted   Nucynta [tapentadol hcl] Swelling 12/31/2020   Codeine Nausea Only and Other (See Comments) 02/11/2010   Sulfonamide derivatives Nausea Only and Other (See Comments) 02/11/2010   Nexium [esomeprazole magnesium] Nausea Only 06/23/2022   Sulfa antibiotics Nausea Only and Other (See Comments) 06/22/2022   Fluconazole Rash and  Other (See Comments) 12/10/2015    Family History  Problem Relation Age of Onset   Lung cancer Father    COPD Mother    Stroke Mother    Thyroid disease Mother    Colon cancer Neg Hx    Anesthesia problems Neg Hx    Hypotension Neg Hx    Malignant hyperthermia Neg Hx    Pseudochol deficiency Neg Hx     Social History   Socioeconomic History   Marital status: Married    Spouse name: Greggory Stallion   Number of children: 2   Years of education: 12th grade   Highest education level: Not on file  Occupational History   Occupation: retired    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   2 granddaughters.   Retired HR.    Social Determinants of Health   Financial Resource Strain: Low Risk  (05/17/2022)   Overall Financial Resource Strain (CARDIA)    Difficulty of Paying Living Expenses: Not hard at all  Food Insecurity: No Food Insecurity (05/17/2022)   Hunger Vital Sign    Worried About Running Out of Food in the Last Year: Never true    Ran Out of Food in the Last Year: Never true  Transportation Needs: No Transportation Needs (05/17/2022)   PRAPARE - Administrator, Civil Service (Medical): No    Lack of Transportation (Non-Medical): No  Physical Activity: Sufficiently Active (05/17/2022)   Exercise Vital Sign    Days of Exercise per Week: 5 days    Minutes of Exercise per Session: 30 min  Stress: No Stress Concern Present (05/17/2022)   [employer] of Occupational Health - Occupational Stress Questionnaire    Feeling of Stress : Not at all  Social Connections:  Socially Integrated (05/17/2022)   Social Connection and Isolation Panel [NHANES]    Frequency of Communication with Friends and Family: More than three times a week    Frequency of Social Gatherings with Friends and Family: More than three times a week    Attends Religious Services: 1 to 4 times per year    Active Member  of Clubs or Organizations: Yes    Attends Banker Meetings: Never    Marital Status: Married    Subjective: Review of Systems  Constitutional:  Negative for chills and fever.  HENT:  Negative for congestion and hearing loss.   Eyes:  Negative for blurred vision and double vision.  Respiratory:  Negative for cough and shortness of breath.   Cardiovascular:  Negative for chest pain and palpitations.  Gastrointestinal:  Positive for abdominal pain. Negative for blood in stool, constipation, diarrhea, heartburn, melena and vomiting.  Genitourinary:  Negative for dysuria and urgency.  Musculoskeletal:  Negative for joint pain and myalgias.  Skin:  Negative for itching and rash.  Neurological:  Negative for dizziness and headaches.  Psychiatric/Behavioral:  Negative for depression. The patient is not nervous/anxious.      Objective: BP 119/71   Pulse 85   Temp 98.1 F (36.7 C) (Oral)   Ht 5\' 5"  (1.651 m)   Wt 217 lb 3.2 oz (98.5 kg)   BMI 36.14 kg/m  Physical Exam Constitutional:      Appearance: Normal appearance.  HENT:     Head: Normocephalic and atraumatic.  Eyes:     Extraocular Movements: Extraocular movements intact.     Conjunctiva/sclera: Conjunctivae normal.  Cardiovascular:     Rate and Rhythm: Normal rate and regular rhythm.  Pulmonary:     Effort: Pulmonary effort is normal.     Breath sounds: Normal breath sounds.  Abdominal:     General: Bowel sounds are normal.     Palpations: Abdomen is soft.  Musculoskeletal:        General: No swelling. Normal range of motion.     Cervical back: Normal range of motion and neck supple.   Skin:    General: Skin is warm and dry.     Coloration: Skin is not jaundiced.  Neurological:     General: No focal deficit present.     Mental Status: She is alert and oriented to person, place, and time.  Psychiatric:        Mood and Affect: Mood normal.        Behavior: Behavior normal.      Assessment: *Elevated Alk phos *Colitis *RLQ abdominal pain *History of PUD   Plan: Etiology of elevated Alk phos unclear. Does have chronic itching controlled on cetirizine.   Will further work up serologically with AMA, GGT, viral hepatitis panel. May need MRCP and or liver biopsy. Possibly related to CML?  BMs back to baseline. Given recent colitis and ongoing RLQ abdominal pain, will schedule for colonoscopy.   At the same time will perform EGD given history of PUD and reoccurrence of epigastric pain.   The risks including infection, bleed, or perforation as well as benefits, limitations, alternatives and imponderables have been reviewed with the patient. Potential for esophageal dilation, biopsy, etc. have also been reviewed.  Questions have been answered. All parties agreeable.  Offered referral to psychiatrist to discuss depression/anxiety.  She states she would like to hold off for now.  Follow up after procedures  04/13/2023 9:33 AM   Disclaimer: This note was dictated with voice recognition software. Similar sounding words can inadvertently be transcribed and may not be corrected upon review.

## 2023-04-13 NOTE — H&P (View-Only) (Signed)
  Referring Provider: Rakes, Linda M, FNP Primary Care Physician:  Rakes, Linda M, FNP Primary GI:  Dr. Eun Vermeer  Chief Complaint  Patient presents with   Abdominal Pain    Having pain in right lower side. Pcp ordered ct scan and pt saw Kristin Harper and was given cipro and flagyl. Pt took med for a little over one week. Stopped due to having diarrhea and burning in her mouth. Was given magic mouthwash. States diarrhea and burning in her mouth is better.     HPI:   Andrea Martinez is a 77 y.o. female who presents to the clinic for follow up visit. She has a history of peptic ulcer disease, gastric intestinal metaplasia, chronic abdominal pain, CML, arthritis, bladder issues, depression.   EGD 03/20/2022 with 15 mm ulcer at the pylorus, nodular mucosa, biopsies negative for H. pylori.  Started on twice daily PPI.  Repeat EGD 06/28/2022 showed nearly healed ulcer.  Repeat biopsy showed gastric intestinal metaplasia.  No family history of gastric malignancy.  Has chronic right lower quadrant abdominal pain. CT A/P with contrast 03/17/2023 showing long segment mild thickening of the sigmoid colon that may represent mild infectious or inflammatory colitis, numerous diverticula throughout the sigmoid colon although no focally inflamed diverticulum is seen. Slightly thickened walls of urinary bladder.   Completed 3-day course of azithromycin.  Continue to have issues was placed on Cipro and Flagyl which caused diarrhea as well as thrush.  Given Magic mouthwash.  Also with chronically elevated alkaline phosphatase.  Denies any family history of liver disease.  Does note chronic itching related to allergies which is well-controlled on cetirizine.  Status post cholecystectomy.  CT imaging showed mild biliary dilatation likely related to postcholecystectomy changes.  Today, states her bowels have returned to normal.  No longer having diarrhea.  Her thrush is also resolved with Magic mouthwash.  She is quite  tearful for me today.  States she has so much going on in her life including her leukemia, arthritis, bladder issues, issues with depression and stress as well as her chronic abdominal pain.  Abdominal pain sporadic, severe at times, only last a few minutes.  Not related to bowels.  States her bowels are moving well.  No melena hematochezia.  Last colonoscopy 2015 unremarkable.  Does have resurgence of epigastric abdominal pain.  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 Altheimer   Lipoma    left upper arm   Mixed hyperlipidemia    Osteoporosis    Pericardial effusion    a. HCAP complicated by pericardial effusion requiring pericardial window 10/2016 and large L pleural effusion requring VATS.   Pleural effusion    a. s/p VATS 2017.   Pneumonia 10/2016   PONV (postoperative nausea and vomiting)    history of   Prolonged QT interval    Urine incontinence    UTI (lower urinary tract infection)    frequent   Vitamin D deficiency     Past Surgical History:  Procedure Laterality Date   BIOPSY  03/20/2022   Procedure: BIOPSY;  Surgeon: Bryanne Riquelme K, DO;  Location: [facility];  Service: Endoscopy;;  gastric lesion vs. gastric ulcer   BIOPSY  06/28/2022     Procedure: BIOPSY;  Surgeon: Anjelita Sheahan K, DO;  [facility]: [facility];  Service: Endoscopy;;   BRAVO PH STUDY  12/04/2012   Procedure: BRAVO PH STUDY;  Surgeon: Sandi L Fields, MD;  [facility]: [facility];  Service: Endoscopy;  Laterality: N/A;   CHOLECYSTECTOMY N/A 01/03/2014   Procedure: LAPAROSCOPIC CHOLECYSTECTOMY WITH INTRAOPERATIVE CHOLANGIOGRAM;  Surgeon: Thomas A. Cornett, MD;  Location: [facility];  Service: General;  Laterality: N/A;   COLONOSCOPY  01/2004   DR REHMAN, few  small tics   ESOPHAGOGASTRODUODENOSCOPY  02/2010   gastric ulcers   ESOPHAGOGASTRODUODENOSCOPY  12/04/2012   SLF:Erosive gastritis (inflammation) was found in the gastric antrum; multiple biopsies The duodenal mucosa showed no abnormalities in the bulb and second portion of the duodenum   ESOPHAGOGASTRODUODENOSCOPY (EGD) WITH PROPOFOL N/A 03/20/2022   Procedure: ESOPHAGOGASTRODUODENOSCOPY (EGD) WITH PROPOFOL;  Surgeon: Jowell Bossi K, DO;  [facility];  Service: Endoscopy;  Laterality: N/A;   ESOPHAGOGASTRODUODENOSCOPY (EGD) WITH PROPOFOL N/A 06/28/2022   Procedure: ESOPHAGOGASTRODUODENOSCOPY (EGD) WITH PROPOFOL;  Surgeon: Jamaris Biernat K, DO;  [facility]: [facility];  Service: Endoscopy;  Laterality: N/A;  800   JOINT REPLACEMENT Right 10/22/2019   KYPHOPLASTY N/A 02/14/2019   Procedure: KYPHOPLASTY T12 and L1;  Surgeon: Brooks, Dahari, MD;  Location: [facility];  Service: Orthopedics;  Laterality: N/A;  120 mins   LIPOMA EXCISION  08/02/2011   left shoulder   NOSE SURGERY     PARTIAL HYSTERECTOMY     vaginal at age 32 years of age   TOE DEBRIDEMENT Left 1962   lt great toe   TOTAL HIP ARTHROPLASTY Right 11/18/2021   Procedure: TOTAL HIP ARTHROPLASTY ANTERIOR APPROACH;  Surgeon: Aluisio, Frank, MD;  Location: [facility];  Service: Orthopedics;  Laterality: Right;   TOTAL KNEE ARTHROPLASTY Right 10/22/2019   Procedure: TOTAL KNEE ARTHROPLASTY;  Surgeon: Aluisio, Frank, MD;  Location: [facility];  Service: Orthopedics;  Laterality: Right;  50min   TOTAL KNEE ARTHROPLASTY Left 03/03/2020   Procedure: TOTAL KNEE ARTHROPLASTY;  Surgeon: Aluisio, Frank, MD;  Location: [facility];  Service: Orthopedics;  Laterality: Left;  50min   TRANSFORAMINAL LUMBAR INTERBODY FUSION (TLIF) WITH PEDICLE SCREW FIXATION 1 LEVEL N/A 01/28/2021   Procedure: TRANSFORAMINAL LUMBAR INTERBODY FUSION (TLIF) LUMBAR FIVE-SACRAL ONE;  Surgeon: Brooks, Dahari, MD;  Location: [facility];  Service: Orthopedics;  Laterality: N/A;  4 hrs    VIDEO ASSISTED THORACOSCOPY (VATS)/EMPYEMA Left 10/25/2016   Procedure: VIDEO ASSISTED THORACOSCOPY (VATS), BRONCH,DRAINAGE OF PLEURAL EFFUSION,PERICARDIAL WINDOW WITH DRAINAGE OF PERICARDIAL FLUID, TEE;  Surgeon: Steven C Hendrickson, MD;  Location: [facility];  Service: Thoracic;  Laterality: Left;   VIDEO BRONCHOSCOPY N/A 10/25/2016   Procedure: VIDEO BRONCHOSCOPY;  Surgeon: Steven C Hendrickson, MD;  Location: [facility];  Service: Thoracic;  Laterality: N/A;    Current Outpatient Medications  Medication Sig Dispense Refill   acetaminophen (TYLENOL) 500 MG tablet Take 1,000 mg by mouth every 8 (eight) hours as needed for moderate pain.     cetirizine (ZYRTEC) 10 MG tablet Take 10 mg by mouth daily.     Cholecalciferol (VITAMIN D3) 50 MCG (2000 UT) TABS Take 2,000 Units by mouth daily in the afternoon.     citalopram (CELEXA) 20 MG tablet TAKE 1 TABLET BY MOUTH EVERY DAY 90 tablet 1   lactose free nutrition (BOOST) LIQD Take 237 mLs by mouth daily.     levothyroxine (SYNTHROID) 88 MCG tablet TAKE 1 TABLET BY MOUTH DAILY BEFORE BREAKFAST. 90 tablet 1     Multiple Vitamins-Minerals (CENTRUM SILVER PO) Take 1 tablet by mouth daily in the afternoon.     nilotinib (TASIGNA) 150 MG capsule Take 2 capsules (300 mg total) by mouth every 12 (twelve) hours. 120 capsule 12   Probiotic Product (PROBIOTIC DAILY PO) Take 2 each by mouth daily in the afternoon.     vitamin B-12 (CYANOCOBALAMIN) 1000 MCG tablet Take 1,000 mcg by mouth daily.     No current facility-administered medications for this visit.    Allergies as of 04/13/2023 - Review Complete 04/13/2023  Allergen Reaction Noted   Nucynta [tapentadol hcl] Swelling 12/31/2020   Codeine Nausea Only and Other (See Comments) 02/11/2010   Sulfonamide derivatives Nausea Only and Other (See Comments) 02/11/2010   Nexium [esomeprazole magnesium] Nausea Only 06/23/2022   Sulfa antibiotics Nausea Only and Other (See Comments) 06/22/2022   Fluconazole Rash and  Other (See Comments) 12/10/2015    Family History  Problem Relation Age of Onset   Lung cancer Father    COPD Mother    Stroke Mother    Thyroid disease Mother    Colon cancer Neg Hx    Anesthesia problems Neg Hx    Hypotension Neg Hx    Malignant hyperthermia Neg Hx    Pseudochol deficiency Neg Hx     Social History   Socioeconomic History   Marital status: Married    Spouse name: George   Number of children: 2   Years of education: 12th grade   Highest education level: Not on file  Occupational History   Occupation: retired    Employer: UENPLOYED    Employer: RETIRED  Tobacco Use   Smoking status: Never    Passive exposure: Never   Smokeless tobacco: Never  Vaping Use   Vaping Use: Never used  Substance and Sexual Activity   Alcohol use: No    Alcohol/week: 0.0 standard drinks of alcohol   Drug use: No   Sexual activity: Yes    Partners: Male    Birth control/protection: None  Other Topics Concern   Not on file  Social History Narrative   1 son, 1 daughter.   2 granddaughters.   Retired HR.    Social Determinants of Health   Financial Resource Strain: Low Risk  (05/17/2022)   Overall Financial Resource Strain (CARDIA)    Difficulty of Paying Living Expenses: Not hard at all  Food Insecurity: No Food Insecurity (05/17/2022)   Hunger Vital Sign    Worried About Running Out of Food in the Last Year: Never true    Ran Out of Food in the Last Year: Never true  Transportation Needs: No Transportation Needs (05/17/2022)   PRAPARE - Transportation    Lack of Transportation (Medical): No    Lack of Transportation (Non-Medical): No  Physical Activity: Sufficiently Active (05/17/2022)   Exercise Vital Sign    Days of Exercise per Week: 5 days    Minutes of Exercise per Session: 30 min  Stress: No Stress Concern Present (05/17/2022)   [employer] - Occupational Stress Questionnaire    Feeling of Stress : Not at all  Social Connections:  Socially Integrated (05/17/2022)   Social Connection and Isolation Panel [NHANES]    Frequency of Communication with Friends and Family: More than three times a week    Frequency of Social Gatherings with Friends and Family: More than three times a week    Attends Religious Services: 1 to 4 times per year    Active Member   of Clubs or Organizations: Yes    Attends Club or Organization Meetings: Never    Marital Status: Married    Subjective: Review of Systems  Constitutional:  Negative for chills and fever.  HENT:  Negative for congestion and hearing loss.   Eyes:  Negative for blurred vision and double vision.  Respiratory:  Negative for cough and shortness of breath.   Cardiovascular:  Negative for chest pain and palpitations.  Gastrointestinal:  Positive for abdominal pain. Negative for blood in stool, constipation, diarrhea, heartburn, melena and vomiting.  Genitourinary:  Negative for dysuria and urgency.  Musculoskeletal:  Negative for joint pain and myalgias.  Skin:  Negative for itching and rash.  Neurological:  Negative for dizziness and headaches.  Psychiatric/Behavioral:  Negative for depression. The patient is not nervous/anxious.      Objective: BP 119/71   Pulse 85   Temp 98.1 F (36.7 C) (Oral)   Ht 5' 5" (1.651 m)   Wt 217 lb 3.2 oz (98.5 kg)   BMI 36.14 kg/m  Physical Exam Constitutional:      Appearance: Normal appearance.  HENT:     Head: Normocephalic and atraumatic.  Eyes:     Extraocular Movements: Extraocular movements intact.     Conjunctiva/sclera: Conjunctivae normal.  Cardiovascular:     Rate and Rhythm: Normal rate and regular rhythm.  Pulmonary:     Effort: Pulmonary effort is normal.     Breath sounds: Normal breath sounds.  Abdominal:     General: Bowel sounds are normal.     Palpations: Abdomen is soft.  Musculoskeletal:        General: No swelling. Normal range of motion.     Cervical back: Normal range of motion and neck supple.   Skin:    General: Skin is warm and dry.     Coloration: Skin is not jaundiced.  Neurological:     General: No focal deficit present.     Mental Status: She is alert and oriented to person, place, and time.  Psychiatric:        Mood and Affect: Mood normal.        Behavior: Behavior normal.      Assessment: *Elevated Alk phos *Colitis *RLQ abdominal pain *History of PUD   Plan: Etiology of elevated Alk phos unclear. Does have chronic itching controlled on cetirizine.   Will further work up serologically with AMA, GGT, viral hepatitis panel. May need MRCP and or liver biopsy. Possibly related to CML?  BMs back to baseline. Given recent colitis and ongoing RLQ abdominal pain, will schedule for colonoscopy.   At the same time will perform EGD given history of PUD and reoccurrence of epigastric pain.   The risks including infection, bleed, or perforation as well as benefits, limitations, alternatives and imponderables have been reviewed with the patient. Potential for esophageal dilation, biopsy, etc. have also been reviewed.  Questions have been answered. All parties agreeable.  Offered referral to psychiatrist to discuss depression/anxiety.  She states she would like to hold off for now.  Follow up after procedures  04/13/2023 9:33 AM   Disclaimer: This note was dictated with voice recognition software. Similar sounding words can inadvertently be transcribed and may not be corrected upon review.  

## 2023-04-14 LAB — MITOCHONDRIAL ANTIBODIES

## 2023-04-14 LAB — ACUTE VIRAL HEPATITIS (HAV, HBV, HCV): Hep B C IgM: NEGATIVE

## 2023-04-14 LAB — HCV INTERPRETATION

## 2023-04-15 LAB — GAMMA GT: GGT: 41 IU/L (ref 0–60)

## 2023-04-15 LAB — ACUTE VIRAL HEPATITIS (HAV, HBV, HCV)
HCV Ab: NONREACTIVE
Hep A IgM: NEGATIVE
Hepatitis B Surface Ag: NEGATIVE

## 2023-04-20 ENCOUNTER — Ambulatory Visit: Payer: PPO | Admitting: Gastroenterology

## 2023-04-21 DIAGNOSIS — M1612 Unilateral primary osteoarthritis, left hip: Secondary | ICD-10-CM | POA: Diagnosis not present

## 2023-04-27 ENCOUNTER — Inpatient Hospital Stay: Payer: PPO | Attending: Hematology

## 2023-04-27 ENCOUNTER — Encounter (HOSPITAL_COMMUNITY): Payer: Self-pay | Admitting: Adult Health

## 2023-04-27 DIAGNOSIS — Z801 Family history of malignant neoplasm of trachea, bronchus and lung: Secondary | ICD-10-CM | POA: Diagnosis not present

## 2023-04-27 DIAGNOSIS — C9210 Chronic myeloid leukemia, BCR/ABL-positive, not having achieved remission: Secondary | ICD-10-CM | POA: Diagnosis not present

## 2023-04-27 DIAGNOSIS — D509 Iron deficiency anemia, unspecified: Secondary | ICD-10-CM

## 2023-04-27 DIAGNOSIS — D649 Anemia, unspecified: Secondary | ICD-10-CM | POA: Insufficient documentation

## 2023-04-27 LAB — COMPREHENSIVE METABOLIC PANEL
ALT: 23 U/L (ref 0–44)
AST: 31 U/L (ref 15–41)
Albumin: 4 g/dL (ref 3.5–5.0)
Alkaline Phosphatase: 170 U/L — ABNORMAL HIGH (ref 38–126)
Anion gap: 9 (ref 5–15)
BUN: 21 mg/dL (ref 8–23)
CO2: 24 mmol/L (ref 22–32)
Calcium: 9.1 mg/dL (ref 8.9–10.3)
Chloride: 105 mmol/L (ref 98–111)
Creatinine, Ser: 0.93 mg/dL (ref 0.44–1.00)
GFR, Estimated: >60 mL/min (ref >60)
Glucose, Bld: 104 mg/dL — ABNORMAL HIGH (ref 70–99)
Potassium: 4.6 mmol/L (ref 3.5–5.1)
Sodium: 138 mmol/L (ref 135–145)
Total Bilirubin: 1 mg/dL (ref 0.3–1.2)
Total Protein: 7.9 g/dL (ref 6.5–8.1)

## 2023-04-27 LAB — CBC WITH DIFFERENTIAL/PLATELET
Abs Immature Granulocytes: 0.08 10*3/uL — ABNORMAL HIGH (ref 0.00–0.07)
Basophils Absolute: 0 10*3/uL (ref 0.0–0.1)
Basophils Relative: 0 %
Eosinophils Absolute: 0.2 10*3/uL (ref 0.0–0.5)
Eosinophils Relative: 2 %
HCT: 40 % (ref 36.0–46.0)
Hemoglobin: 12.8 g/dL (ref 12.0–15.0)
Immature Granulocytes: 1 %
Lymphocytes Relative: 16 %
Lymphs Abs: 1.7 10*3/uL (ref 0.7–4.0)
MCH: 29.8 pg (ref 26.0–34.0)
MCHC: 32 g/dL (ref 30.0–36.0)
MCV: 93.2 fL (ref 80.0–100.0)
Monocytes Absolute: 0.7 10*3/uL (ref 0.1–1.0)
Monocytes Relative: 7 %
Neutro Abs: 8 10*3/uL — ABNORMAL HIGH (ref 1.7–7.7)
Neutrophils Relative %: 74 %
Platelets: 275 10*3/uL (ref 150–400)
RBC: 4.29 MIL/uL (ref 3.87–5.11)
RDW: 13.3 % (ref 11.5–15.5)
WBC: 10.8 10*3/uL — ABNORMAL HIGH (ref 4.0–10.5)
nRBC: 0 % (ref 0.0–0.2)

## 2023-04-27 LAB — FERRITIN: Ferritin: 292 ng/mL (ref 11–307)

## 2023-04-27 LAB — IRON AND TIBC
Iron: 43 ug/dL (ref 28–170)
Saturation Ratios: 14 % (ref 10.4–31.8)
TIBC: 304 ug/dL (ref 250–450)
UIBC: 261 ug/dL

## 2023-04-27 LAB — LACTATE DEHYDROGENASE: LDH: 150 U/L (ref 98–192)

## 2023-05-02 LAB — BCR-ABL1 KINASE DOMAIN MUTATION ANALYSIS

## 2023-05-03 LAB — BCR-ABL1, CML/ALL, PCR, QUANT: Interpretation (BCRAL):: NEGATIVE

## 2023-05-03 NOTE — Patient Instructions (Signed)
Andrea Martinez  05/03/2023     @PREFPERIOPPHARMACY @   Your procedure is scheduled on  05/10/2023.   Report to [facility] at  1045  A.M.   Call this number if you have problems the morning of surgery:  [phone]  If you experience any cold or flu symptoms such as cough, fever, chills, shortness of breath, etc. between now and your scheduled surgery, please notify us at the above number.   Remember:  Follow the diet and prep instructions given to you by the office.     Take these medicines the morning of surgery with A SIP OF WATER               zyrtec, celexa, levothyroxine, nilotinib.     Do not wear jewelry, make-up or nail polish, including gel polish,  artificial nails, or any other type of covering on natural nails (fingers and  toes).  Do not wear lotions, powders, or perfumes, or deodorant.  Do not shave 48 hours prior to surgery.  Men may shave face and neck.  Do not bring valuables to the hospital.  [facility] is not responsible for any belongings or valuables.  Contacts, dentures or bridgework may not be worn into surgery.  Leave your suitcase in the car.  After surgery it may be brought to your room.  For patients admitted to the hospital, discharge time will be determined by your treatment team.  Patients discharged the day of surgery will not be allowed to drive home and must  have someone with them for 24 hours.    Special instructions:   DO NOT smoke tobacco or vape for 24 hours before your procedure.  Please read over the following fact sheets that you were given. Anesthesia Post-op Instructions and Care and Recovery After Surgery        Upper Endoscopy, Adult, Care After After the procedure, it is common to have a sore throat. It is also common to have: Mild stomach pain or discomfort. Bloating. Nausea. Follow these instructions at home: The instructions below may help you care for yourself at home. Your health care provider may give  you more instructions. If you have questions, ask your health care provider. If you were given a sedative during the procedure, it can affect you for several hours. Do not drive or operate machinery until your health care provider says that it is safe. If you will be going home right after the procedure, plan to have a responsible adult: Take you home from the hospital or clinic. You will not be allowed to drive. Care for you for the time you are told. Follow instructions from your health care provider about what you may eat and drink. Return to your normal activities as told by your health care provider. Ask your health care provider what activities are safe for you. Take over-the-counter and prescription medicines only as told by your health care provider. Contact a health care provider if you: Have a sore throat that lasts longer than one day. Have trouble swallowing. Have a fever. Get help right away if you: Vomit blood or your vomit looks like coffee grounds. Have bloody, black, or tarry stools. Have a very bad sore throat or you cannot swallow. Have difficulty breathing or very bad pain in your chest or abdomen. These symptoms may be an emergency. Get help right away. Call 911. Do not wait to see if the symptoms will go away. Do not drive  yourself to the hospital. Summary After the procedure, it is common to have a sore throat, mild stomach discomfort, bloating, and nausea. If you were given a sedative during the procedure, it can affect you for several hours. Do not drive until your health care provider says that it is safe. Follow instructions from your health care provider about what you may eat and drink. Return to your normal activities as told by your health care provider. This information is not intended to replace advice given to you by your health care provider. Make sure you discuss any questions you have with your health care provider. Document Revised: 03/10/2022 Document  Reviewed: 03/10/2022 Elsevier Patient Education  2023 [employer] Colonoscopy, Adult, Care After The following information offers guidance on how to care for yourself after your procedure. Your health care provider may also give you more specific instructions. If you have problems or questions, contact your health care provider. What can I expect after the procedure? After the procedure, it is common to have: A small amount of blood in your stool for 24 hours after the procedure. Some gas. Mild cramping or bloating of your abdomen. Follow these instructions at home: Eating and drinking  Drink enough fluid to keep your urine pale yellow. Follow instructions from your health care provider about eating or drinking restrictions. Resume your normal diet as told by your health care provider. Avoid heavy or fried foods that are hard to digest. Activity Rest as told by your health care provider. Avoid sitting for a long time without moving. Get up to take short walks every 1-2 hours. This is important to improve blood flow and breathing. Ask for help if you feel weak or unsteady. Return to your normal activities as told by your health care provider. Ask your health care provider what activities are safe for you. Managing cramping and bloating  Try walking around when you have cramps or feel bloated. If directed, apply heat to your abdomen as told by your health care provider. Use the heat source that your health care provider recommends, such as a moist heat pack or a heating pad. Place a towel between your skin and the heat source. Leave the heat on for 20-30 minutes. Remove the heat if your skin turns bright red. This is especially important if you are unable to feel pain, heat, or cold. You have a greater risk of getting burned. General instructions If you were given a sedative during the procedure, it can affect you for several hours. Do not drive or operate machinery until your health care  provider says that it is safe. For the first 24 hours after the procedure: Do not sign important documents. Do not drink alcohol. Do your regular daily activities at a slower pace than normal. Eat soft foods that are easy to digest. Take over-the-counter and prescription medicines only as told by your health care provider. Keep all follow-up visits. This is important. Contact a health care provider if: You have blood in your stool 2-3 days after the procedure. Get help right away if: You have more than a small spotting of blood in your stool. You have large blood clots in your stool. You have swelling of your abdomen. You have nausea or vomiting. You have a fever. You have increasing pain in your abdomen that is not relieved with medicine. These symptoms may be an emergency. Get help right away. Call 911. Do not wait to see if the symptoms will go away. Do not drive  yourself to the hospital. Summary After the procedure, it is common to have a small amount of blood in your stool. You may also have mild cramping and bloating of your abdomen. If you were given a sedative during the procedure, it can affect you for several hours. Do not drive or operate machinery until your health care provider says that it is safe. Get help right away if you have a lot of blood in your stool, nausea or vomiting, a fever, or increased pain in your abdomen. This information is not intended to replace advice given to you by your health care provider. Make sure you discuss any questions you have with your health care provider. Document Revised: 07/22/2021 Document Reviewed: 07/22/2021 Elsevier Patient Education  2023 Elsevier Inc. Monitored Anesthesia Care, Care After The following information offers guidance on how to care for yourself after your procedure. Your health care provider may also give you more specific instructions. If you have problems or questions, contact your health care provider. What can I  expect after the procedure? After the procedure, it is common to have: Tiredness. Little or no memory about what happened during or after the procedure. Impaired judgment when it comes to making decisions. Nausea or vomiting. Some trouble with balance. Follow these instructions at home: For the time period you were told by your health care provider:  Rest. Do not participate in activities where you could fall or become injured. Do not drive or use machinery. Do not drink alcohol. Do not take sleeping pills or medicines that cause drowsiness. Do not make important decisions or sign legal documents. Do not take care of children on your own. Medicines Take over-the-counter and prescription medicines only as told by your health care provider. If you were prescribed antibiotics, take them as told by your health care provider. Do not stop using the antibiotic even if you start to feel better. Eating and drinking Follow instructions from your health care provider about what you may eat and drink. Drink enough fluid to keep your urine pale yellow. If you vomit: Drink clear fluids slowly and in small amounts as you are able. Clear fluids include water, ice chips, low-calorie sports drinks, and fruit juice that has water added to it (diluted fruit juice). Eat light and bland foods in small amounts as you are able. These foods include bananas, applesauce, rice, lean meats, toast, and crackers. General instructions  Have a responsible adult stay with you for the time you are told. It is important to have someone help care for you until you are awake and alert. If you have sleep apnea, surgery and some medicines can increase your risk for breathing problems. Follow instructions from your health care provider about wearing your sleep device: When you are sleeping. This includes during daytime naps. While taking prescription pain medicines, sleeping medicines, or medicines that make you drowsy. Do  not use any products that contain nicotine or tobacco. These products include cigarettes, chewing tobacco, and vaping devices, such as e-cigarettes. If you need help quitting, ask your health care provider. Contact a health care provider if: You feel nauseous or vomit every time you eat or drink. You feel light-headed. You are still sleepy or having trouble with balance after 24 hours. You get a rash. You have a fever. You have redness or swelling around the IV site. Get help right away if: You have trouble breathing. You have new confusion after you get home. These symptoms may be an emergency. Get help  right away. Call 911. Do not wait to see if the symptoms will go away. Do not drive yourself to the hospital. This information is not intended to replace advice given to you by your health care provider. Make sure you discuss any questions you have with your health care provider. Document Revised: 04/26/2022 Document Reviewed: 04/26/2022 Elsevier Patient Education  2023 [employer].

## 2023-05-04 ENCOUNTER — Inpatient Hospital Stay (HOSPITAL_BASED_OUTPATIENT_CLINIC_OR_DEPARTMENT_OTHER): Payer: PPO | Admitting: Hematology

## 2023-05-04 ENCOUNTER — Encounter: Payer: Self-pay | Admitting: Hematology

## 2023-05-04 VITALS — BP 126/55 | HR 96 | Temp 98.3°F | Resp 18 | Wt 215.0 lb

## 2023-05-04 DIAGNOSIS — D509 Iron deficiency anemia, unspecified: Secondary | ICD-10-CM | POA: Diagnosis not present

## 2023-05-04 DIAGNOSIS — C9210 Chronic myeloid leukemia, BCR/ABL-positive, not having achieved remission: Secondary | ICD-10-CM

## 2023-05-04 NOTE — Progress Notes (Signed)
[facility] [address] [place] [address]    Clinic Day:  05/04/2023  Referring physician: [name], FNP  Patient Care Team: [name], FNP as PCP - General (Family Medicine) [name], MD as Medical Oncologist (Medical Oncology) [name], MD as Consulting Physician (Endocrinology) [name], MD as Consulting Physician (Orthopedic Surgery)   ASSESSMENT & PLAN:   Assessment: 1.  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 -Left pleural effusion and pericardial effusion requiring pericardial window on 10/25/2016.  Fluid cytology negative. -Started back on Nilotinib 300 mg every 12 hours on 12/06/2016.   2.  Daytime sweats: -He had a long history of daytime sweats.  She had extensive work-up including a CT scan CAP, which showed no evidence of malignancy.  Hepatic morphology suspicious for cirrhosis.    Plan: 1.  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 - Reviewed labs from 04/27/2023: WBC-10.8, ANC-8.0.  BCR/ABL by RT-PCR is negative. - She reports having left hip pain and is planning on having surgery in 4 months.  She wants to take Aleve for the left hip pain.  I have told her that she may take it as needed but not on a continuous basis. - She reports daytime sweats when she is doing the day-to-day activities.  They are not impairing her function.  Will monitor at this time. - Recommend RTC in 3 months with repeat labs.   2.  Normocytic anemia: - Feraheme on 02/08/2023 on 02/15/2023. - Reports improvement in energy levels.  Hemoglobin is 12.8, ferritin 292 and percent saturation of 14. - She is scheduled for EGD/colonoscopy next Tuesday.    Orders Placed This Encounter  Procedures   CBC with Differential    Standing Status:   Future    Standing Expiration Date:   05/03/2024   Comprehensive metabolic panel    Standing Status:   Future     Standing Expiration Date:   05/03/2024   Lactate dehydrogenase    Standing Status:   Future    Standing Expiration Date:   05/03/2024   Iron and TIBC ([facility] ONLY)    Standing Status:   Future    Standing Expiration Date:   05/03/2024   Ferritin    Standing Status:   Future    Standing Expiration Date:   05/03/2024   BCR-ABL1, CML/ALL, PCR, QUANT    Standing Status:   Future    Standing Expiration Date:   05/03/2024      I,Andrea Martinez,acting as a scribe for [name], MD.,have documented all relevant documentation on the behalf of [name], MD,as directed by  [name], MD while in the presence of [name], MD.   I, [name] MD, have reviewed the above documentation for accuracy and completeness, and I agree with the above.   [name], MD   5/22/20245:06 PM  CHIEF COMPLAINT:   Diagnosis: CML    Cancer Staging  No matching staging information was found for the patient.   Prior Therapy: Hydrea 1000 mg and Allopurinol 300 mg daily  Current Therapy:  Tasigna 300 mg Q 12 H    HISTORY OF PRESENT ILLNESS:   Oncology History  CML (chronic myeloid leukemia) (HCC)  11/21/2015 Initial Diagnosis   Chronic myelogenous leukemia (CML), BCR-ABL1-positive (HCC)   11/21/2015 Miscellaneous   Seen by Dr. Lowell Guitar [facility]   11/21/2015 -  Chemotherapy  Hydrea 1000 mg and Allopurinol 300 mg daily (due to high uric acid level)   11/21/2015 Tumor Marker   BCR P210 Ratio of Fusion to Control, Blood 0.8941    P210 IS % Ratio, Blood 85.834%     01/02/2016 - 10/23/2016 Chemotherapy   Tasigna   01/08/2016 Miscellaneous   EKG- QT/QTc: 382/440 ms   01/16/2016 Miscellaneous   EKG- QT/QTc: 364/419   03/30/2016 Tumor Marker   b2a2 transcript: 0.317 b3a2 transcript: 0.194   10/23/2016 - 11/05/2016 Hospital Admission   large Left Pleural Effusion and Pericardial Effusion that is now s/p VATS with Drainage, and  Drainage of Pericardial Fluid with Window and Biopsy of Pericardium   12/06/2016 -  Chemotherapy   Tasigna restarted    05/24/2017 Adverse Reaction   Prolonged QTc on EKG; likely d/t chemo. Tasigna ON HOLD until next visit.    06/10/2017 -  Chemotherapy   Resumed Tasigna   07/13/2017 Miscellaneous   Evaluated by Cardiology for prolonged QTc. Per Dr. Purvis Sheffield, patient's QT intervals are not prolonged to the point of potentially promoting a ventricular arrhythmia. He thinks she can continue on current medical therapy for her CML with serial ECG monitoring.   12/27/2017 Miscellaneous   EKG: QT/QTc 398/435      INTERVAL HISTORY:   Andrea Martinez is a 77 y.o. female presenting to clinic today for follow up of CML. She was last seen by me on 02/02/23.  Of note since her last visit, she underwent CT A/P on 03/17/23 for abdominal pain. This showed: thickening of sigmoid colon; slightly thickened bladder walls within limitation of being partially obscured by metal artifact. She is scheduled for colonoscopy next week, 05/10/23, under Dr. Marletta Lor for further evaluation.  Today, she states that she is doing well overall. Her appetite level is at 100%. Her energy level is at 10%.  PAST MEDICAL HISTORY:   Past Medical History: Past Medical History:  Diagnosis Date   Anxiety    denies   Arthritis    Chronic myelogenous leukemia (CML), 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 [name]   Lipoma    left upper arm   Mixed hyperlipidemia    Osteoporosis    Pericardial effusion    a. HCAP complicated by pericardial effusion requiring pericardial window 10/2016 and large L pleural effusion requring VATS.   Pleural effusion    a. s/p VATS 2017.   Pneumonia 10/2016   PONV (postoperative nausea and vomiting)    history of   Prolonged QT interval    Urine incontinence    UTI (lower urinary tract  infection)    frequent   Vitamin D deficiency     Surgical History: Past Surgical History:  Procedure Laterality Date   BIOPSY  03/20/2022   Procedure: BIOPSY;  Surgeon: Lanelle Bal, DO;  [facility];  Service: Endoscopy;;  gastric lesion vs. gastric ulcer   BIOPSY  06/28/2022   Procedure: BIOPSY;  Surgeon: Lanelle Bal, DO;  [facility];  Service: Endoscopy;;   BRAVO PH STUDY  12/04/2012   Procedure: BRAVO PH STUDY;  Surgeon: West Bali, MD;  [facility];  Service: Endoscopy;  Laterality: N/A;   CHOLECYSTECTOMY N/A 01/03/2014   Procedure: LAPAROSCOPIC CHOLECYSTECTOMY WITH INTRAOPERATIVE CHOLANGIOGRAM;  Surgeon: Clovis Pu. Cornett, MD;  Location: [facility];  Service: General;  Laterality: N/A;   COLONOSCOPY  01/2004   DR [facility], few small tics   ESOPHAGOGASTRODUODENOSCOPY  02/2010   gastric ulcers   ESOPHAGOGASTRODUODENOSCOPY  12/04/2012   [id number] gastritis (inflammation) was found in the gastric antrum; multiple biopsies The duodenal mucosa showed no abnormalities in the bulb and second portion of the duodenum   ESOPHAGOGASTRODUODENOSCOPY (EGD) WITH PROPOFOL N/A 03/20/2022   Procedure: ESOPHAGOGASTRODUODENOSCOPY (EGD) WITH PROPOFOL;  Surgeon: Lanelle Bal, DO;  [facility];  Service: Endoscopy;  Laterality: N/A;   ESOPHAGOGASTRODUODENOSCOPY (EGD) WITH PROPOFOL N/A 06/28/2022   Procedure: ESOPHAGOGASTRODUODENOSCOPY (EGD) WITH PROPOFOL;  Surgeon: Lanelle Bal, DO;  [facility];  Service: Endoscopy;  Laterality: N/A;  800   JOINT REPLACEMENT Right 10/22/2019   KYPHOPLASTY N/A 02/14/2019   Procedure: KYPHOPLASTY T12 and L1;  Surgeon: [name], MD;  Location: [facility];  Service: Orthopedics;  Laterality: N/A;  120 mins   LIPOMA EXCISION  08/02/2011   left shoulder   NOSE SURGERY     PARTIAL HYSTERECTOMY     vaginal at age 47 years of age   TOE DEBRIDEMENT Left 1962   lt great toe   TOTAL HIP  ARTHROPLASTY Right 11/18/2021   Procedure: TOTAL HIP ARTHROPLASTY ANTERIOR APPROACH;  Surgeon: Ollen Gross, MD;  Location: [facility];  Service: Orthopedics;  Laterality: Right;   TOTAL KNEE ARTHROPLASTY Right 10/22/2019   Procedure: TOTAL KNEE ARTHROPLASTY;  Surgeon: Ollen Gross, MD;  Location: [facility];  Service: Orthopedics;  Laterality: Right;    TOTAL KNEE ARTHROPLASTY Left 03/03/2020   Procedure: TOTAL KNEE ARTHROPLASTY;  Surgeon: Ollen Gross, MD;  Location: [facility];  Service: Orthopedics;  Laterality: Left;    TRANSFORAMINAL LUMBAR INTERBODY FUSION (TLIF) WITH PEDICLE SCREW FIXATION 1 LEVEL N/A 01/28/2021   Procedure: TRANSFORAMINAL LUMBAR INTERBODY FUSION (TLIF) LUMBAR FIVE-SACRAL ONE;  Surgeon: [name], MD;  Location: [facility];  Service: Orthopedics;  Laterality: N/A;  4 hrs   VIDEO ASSISTED THORACOSCOPY (VATS)/EMPYEMA Left 10/25/2016   Procedure: VIDEO ASSISTED THORACOSCOPY (VATS), BRONCH,DRAINAGE OF PLEURAL EFFUSION,PERICARDIAL WINDOW WITH DRAINAGE OF PERICARDIAL FLUID, TEE;  Surgeon: Loreli Slot, MD;  Location: [facility];  Service: Thoracic;  Laterality: Left;   VIDEO BRONCHOSCOPY N/A 10/25/2016   Procedure: VIDEO BRONCHOSCOPY;  Surgeon: Loreli Slot, MD;  Location: [facility];  Service: Thoracic;  Laterality: N/A;    Social History: Social History   Socioeconomic History   Marital status: Married    Spouse name: Greggory Stallion   Number of children: 2   Years of education: 12th grade   Highest education level: Not on file  Occupational History   Occupation: retired    Associate Professor: UENPLOYED    Employer: RETIRED  Tobacco Use   Smoking status: Never    Passive exposure: Never   Smokeless tobacco: Never  Vaping Use   Vaping Use: Never used  Substance and Sexual Activity   Alcohol use: No    Alcohol/week: 0.0 standard drinks of alcohol   Drug use: No   Sexual activity: Yes    Partners: Male    Birth control/protection: None  Other Topics Concern   Not on  file  Social History Narrative   1 son, 1 daughter.   2 granddaughters.   Retired HR.    Social Determinants of Health   Financial Resource Strain: Low Risk  (05/17/2022)   Overall Financial Resource Strain (CARDIA)    Difficulty of Paying Living Expenses: Not hard at all  Food  Insecurity: No Food Insecurity (05/17/2022)   Hunger Vital Sign    Worried About Running Out of Food in the Last Year: Never true    Ran Out of Food in the Last Year: Never true  Transportation Needs: No Transportation Needs (05/17/2022)   PRAPARE - Administrator, Civil Service (Medical): No    Lack of Transportation (Non-Medical): No  Physical Activity: Sufficiently Active (05/17/2022)   Exercise Vital Sign    Days of Exercise per Week: 5 days    Minutes of Exercise per Session: 30 min  Stress: No Stress Concern Present (05/17/2022)   [employer] of Occupational Health - Occupational Stress Questionnaire    Feeling of Stress : Not at all  Social Connections: Socially Integrated (05/17/2022)   Social Connection and Isolation Panel [NHANES]    Frequency of Communication with Friends and Family: More than three times a week    Frequency of Social Gatherings with Friends and Family: More than three times a week    Attends Religious Services: 1 to 4 times per year    Active Member of [employer] or Organizations: Yes    Attends Banker Meetings: Never    Marital Status: Married  Catering manager Violence: Not At Risk (05/17/2022)   Humiliation, Afraid, Rape, and Kick questionnaire    Fear of Current or Ex-Partner: No    Emotionally Abused: No    Physically Abused: No    Sexually Abused: No    Family History: Family History  Problem Relation Age of Onset   Lung cancer Father    COPD Mother    Stroke Mother    Thyroid disease Mother    Colon cancer Neg Hx    Anesthesia problems Neg Hx    Hypotension Neg Hx    Malignant hyperthermia Neg Hx    Pseudochol deficiency Neg Hx     Current  Medications:  Current Outpatient Medications:    acetaminophen (TYLENOL) 500 MG tablet, Take 1,000 mg by mouth every 8 (eight) hours as needed for moderate pain., Disp: , Rfl:    cetirizine (ZYRTEC) 10 MG tablet, Take 10 mg by mouth daily., Disp: , Rfl:    citalopram (CELEXA) 20 MG tablet, TAKE 1 TABLET BY MOUTH EVERY DAY, Disp: 90 tablet, Rfl: 1   lactose free nutrition (BOOST) LIQD, Take 237 mLs by mouth daily., Disp: , Rfl:    levothyroxine (SYNTHROID) 88 MCG tablet, TAKE 1 TABLET BY MOUTH DAILY BEFORE BREAKFAST., Disp: 90 tablet, Rfl: 1   Multiple Vitamins-Minerals (CENTRUM SILVER PO), Take 1 tablet by mouth daily in the afternoon., Disp: , Rfl:    nilotinib (TASIGNA) 150 MG capsule, Take 2 capsules (300 mg total) by mouth every 12 (twelve) hours., Disp: 120 capsule, Rfl: 12   Probiotic Product (DIGESTIVE ADV PREBIOT+PROBIOT) CHEW, Chew 2 each by mouth daily., Disp: , Rfl:    Allergies: Allergies  Allergen Reactions   Nucynta [Tapentadol Hcl] Swelling   Codeine Nausea Only and Other (See Comments)    Headache   Sulfonamide Derivatives Nausea Only and Other (See Comments)    Headache   Nexium [Esomeprazole Magnesium] Nausea Only   Sulfa Antibiotics Nausea Only    Headache    Fluconazole Rash    Redness and blistering on left thigh    REVIEW OF SYSTEMS:   Review of Systems  Constitutional:  Negative for chills, fatigue and fever.  HENT:   Negative for lump/mass, mouth sores, nosebleeds, sore throat and trouble swallowing.  Eyes:  Negative for eye problems.  Respiratory:  Negative for cough and shortness of breath.   Cardiovascular:  Negative for chest pain, leg swelling and palpitations.  Gastrointestinal:  Negative for abdominal pain, constipation, diarrhea, nausea and vomiting.  Genitourinary:  Negative for bladder incontinence, difficulty urinating, dysuria, frequency, hematuria and nocturia.   Musculoskeletal:  Negative for arthralgias, back pain, flank pain, myalgias and  neck pain.  Skin:  Negative for itching and rash.  Neurological:  Negative for dizziness, headaches and numbness.  Hematological:  Does not bruise/bleed easily.  Psychiatric/Behavioral:  Negative for depression, sleep disturbance and suicidal ideas. The patient is not nervous/anxious.   All other systems reviewed and are negative.    VITALS:   Blood pressure (!) 126/55, pulse 96, temperature 98.3 F (36.8 C), temperature source Oral, resp. rate 18, weight 215 lb (97.5 kg), SpO2 98 %.  Wt Readings from Last 3 Encounters:  05/04/23 215 lb (97.5 kg)  04/13/23 217 lb 3.2 oz (98.5 kg)  03/23/23 216 lb 6.4 oz (98.2 kg)    Body mass index is 35.78 kg/m.  Performance status (ECOG): 1 - Symptomatic but completely ambulatory  PHYSICAL EXAM:   Physical Exam Vitals and nursing note reviewed. Exam conducted with a chaperone present.  Constitutional:      Appearance: Normal appearance.  Cardiovascular:     Rate and Rhythm: Normal rate and regular rhythm.     Pulses: Normal pulses.     Heart sounds: Normal heart sounds.  Pulmonary:     Effort: Pulmonary effort is normal.     Breath sounds: Normal breath sounds.  Abdominal:     Palpations: Abdomen is soft. There is no hepatomegaly, splenomegaly or mass.     Tenderness: There is no abdominal tenderness.  Musculoskeletal:     Right lower leg: No edema.     Left lower leg: No edema.  Lymphadenopathy:     Cervical: No cervical adenopathy.     Right cervical: No superficial, deep or posterior cervical adenopathy.    Left cervical: No superficial, deep or posterior cervical adenopathy.     Upper Body:     Right upper body: No supraclavicular or axillary adenopathy.     Left upper body: No supraclavicular or axillary adenopathy.  Neurological:     General: No focal deficit present.     Mental Status: She is alert and oriented to person, place, and time.  Psychiatric:        Mood and Affect: Mood normal.        Behavior: Behavior normal.      LABS:      Latest Ref Rng & Units 04/27/2023    2:02 PM 03/17/2023    9:34 AM 03/10/2023    8:45 AM  CBC  WBC 4.0 - 10.5 K/uL 10.8  11.1  11.0   Hemoglobin 12.0 - 15.0 g/dL 16.1  09.6  04.5   Hematocrit 36.0 - 46.0 % 40.0  39.2  39.9   Platelets 150 - 400 K/uL 275  306  283       Latest Ref Rng & Units 04/27/2023    2:02 PM 03/31/2023    2:28 PM 03/17/2023    9:34 AM  CMP  Glucose 70 - 99 mg/dL 409   811   BUN 8 - 23 mg/dL 21   24   Creatinine 9.14 - 1.00 mg/dL 7.82   9.56   Sodium 213 - 145 mmol/L 138   140   Potassium 3.5 -  5.1 mmol/L 4.6   5.1   Chloride 98 - 111 mmol/L 105   102   CO2 22 - 32 mmol/L 24   21   Calcium 8.9 - 10.3 mg/dL 9.1   9.2   Total Protein 6.5 - 8.1 g/dL 7.9   7.5   Total Bilirubin 0.3 - 1.2 mg/dL 1.0   0.8   Alkaline Phos 38 - 126 U/L 170  192  196   AST 15 - 41 U/L 31   24   ALT 0 - 44 U/L 23   16      No results found for: "CEA1", "CEA" / No results found for: "CEA1", "CEA" No results found for: "PSA1" No results found for: "WUJ811" No results found for: "CAN125"  No results found for: "TOTALPROTELP", "ALBUMINELP", "A1GS", "A2GS", "BETS", "BETA2SER", "GAMS", "MSPIKE", "SPEI" Lab Results  Component Value Date   TIBC 304 04/27/2023   TIBC 271 03/10/2023   TIBC 349 01/20/2023   FERRITIN 292 04/27/2023   FERRITIN 749 (H) 03/10/2023   FERRITIN 56 01/20/2023   IRONPCTSAT 14 04/27/2023   IRONPCTSAT 23 03/10/2023   IRONPCTSAT 11 01/20/2023   Lab Results  Component Value Date   LDH 150 04/27/2023   LDH 152 01/20/2023   LDH 141 05/22/2020     STUDIES:   No results found.

## 2023-05-04 NOTE — Patient Instructions (Addendum)
[facility] at [facility] Discharge Instructions   You were seen and examined today by Dr. [name].  He reviewed the results of your lab work which are normal/stable. Your leukemia test came back normal.   You may take Aleve occasionally to help control your hip pain.   We will see you back in 3 months. We will repeat your lab work prior to your next visit.    Thank you for choosing [facility] at [facility] to provide your oncology and hematology care.  To afford each patient quality time with our provider, please arrive at least 15 minutes before your scheduled appointment time.   If you have a lab appointment with the Cancer Center please come in thru the Main Entrance and check in at the main information desk.  You need to re-schedule your appointment should you arrive 10 or more minutes late.  We strive to give you quality time with our providers, and arriving late affects you and other patients whose appointments are after yours.  Also, if you no show three or more times for appointments you may be dismissed from the clinic at the providers discretion.     Again, thank you for choosing [facility].  Our hope is that these requests will decrease the amount of time that you wait before being seen by our physicians.       _____________________________________________________________  Should you have questions after your visit to [facility], please contact our office at [phone] and follow the prompts.  Our office hours are 8:00 a.m. and 4:30 p.m. Monday - Friday.  Please note that voicemails left after 4:00 p.m. may not be returned until the following business day.  We are closed weekends and major holidays.  You do have access to a nurse 24-7, just call the main number to the clinic [phone] and do not press any options, hold on the line and a nurse will answer the phone.    For prescription refill  requests, have your pharmacy contact our office and allow 72 hours.    Due to Covid, you will need to wear a mask upon entering the hospital. If you do not have a mask, a mask will be given to you at the Main Entrance upon arrival. For doctor visits, patients may have 1 support person age 20 or older with them. For treatment visits, patients can not have anyone with them due to social distancing guidelines and our immunocompromised population.

## 2023-05-05 ENCOUNTER — Other Ambulatory Visit: Payer: Self-pay

## 2023-05-05 ENCOUNTER — Encounter (HOSPITAL_COMMUNITY)
Admission: RE | Admit: 2023-05-05 | Discharge: 2023-05-05 | Disposition: A | Discharge status: Home / Self Care | Payer: PPO | Source: Ambulatory Visit | Attending: Internal Medicine | Admitting: Internal Medicine

## 2023-05-05 VITALS — BP 126/55 | HR 96 | Temp 98.3°F | Resp 18 | Ht 65.0 in | Wt 215.0 lb

## 2023-05-05 DIAGNOSIS — R638 Other symptoms and signs concerning food and fluid intake: Secondary | ICD-10-CM | POA: Diagnosis not present

## 2023-05-05 DIAGNOSIS — Z01810 Encounter for preprocedural cardiovascular examination: Secondary | ICD-10-CM | POA: Diagnosis not present

## 2023-05-10 ENCOUNTER — Ambulatory Visit (HOSPITAL_COMMUNITY): Payer: PPO | Admitting: Anesthesiology

## 2023-05-10 ENCOUNTER — Ambulatory Visit (HOSPITAL_BASED_OUTPATIENT_CLINIC_OR_DEPARTMENT_OTHER): Payer: PPO | Admitting: Anesthesiology

## 2023-05-10 ENCOUNTER — Encounter (HOSPITAL_COMMUNITY)
Admission: RE | Disposition: A | Discharge status: Home / Self Care | Payer: Self-pay | Source: Home / Self Care | Attending: Internal Medicine

## 2023-05-10 ENCOUNTER — Ambulatory Visit (HOSPITAL_COMMUNITY)
Admission: RE | Admit: 2023-05-10 | Discharge: 2023-05-10 | Disposition: A | Discharge status: Home / Self Care | Payer: PPO | Attending: Internal Medicine | Admitting: Internal Medicine

## 2023-05-10 DIAGNOSIS — Z9049 Acquired absence of other specified parts of digestive tract: Secondary | ICD-10-CM | POA: Diagnosis not present

## 2023-05-10 DIAGNOSIS — R948 Abnormal results of function studies of other organs and systems: Secondary | ICD-10-CM | POA: Insufficient documentation

## 2023-05-10 DIAGNOSIS — K5730 Diverticulosis of large intestine without perforation or abscess without bleeding: Secondary | ICD-10-CM | POA: Diagnosis not present

## 2023-05-10 DIAGNOSIS — D125 Benign neoplasm of sigmoid colon: Secondary | ICD-10-CM

## 2023-05-10 DIAGNOSIS — Z09 Encounter for follow-up examination after completed treatment for conditions other than malignant neoplasm: Secondary | ICD-10-CM | POA: Diagnosis not present

## 2023-05-10 DIAGNOSIS — K449 Diaphragmatic hernia without obstruction or gangrene: Secondary | ICD-10-CM

## 2023-05-10 DIAGNOSIS — C9210 Chronic myeloid leukemia, BCR/ABL-positive, not having achieved remission: Secondary | ICD-10-CM | POA: Insufficient documentation

## 2023-05-10 DIAGNOSIS — F418 Other specified anxiety disorders: Secondary | ICD-10-CM

## 2023-05-10 DIAGNOSIS — E039 Hypothyroidism, unspecified: Secondary | ICD-10-CM | POA: Diagnosis not present

## 2023-05-10 DIAGNOSIS — K635 Polyp of colon: Secondary | ICD-10-CM

## 2023-05-10 DIAGNOSIS — K3189 Other diseases of stomach and duodenum: Secondary | ICD-10-CM | POA: Diagnosis not present

## 2023-05-10 DIAGNOSIS — K2980 Duodenitis without bleeding: Secondary | ICD-10-CM | POA: Diagnosis not present

## 2023-05-10 DIAGNOSIS — K529 Noninfective gastroenteritis and colitis, unspecified: Secondary | ICD-10-CM

## 2023-05-10 DIAGNOSIS — F32A Depression, unspecified: Secondary | ICD-10-CM | POA: Diagnosis not present

## 2023-05-10 DIAGNOSIS — Z8711 Personal history of peptic ulcer disease: Secondary | ICD-10-CM | POA: Diagnosis not present

## 2023-05-10 DIAGNOSIS — R638 Other symptoms and signs concerning food and fluid intake: Secondary | ICD-10-CM

## 2023-05-10 DIAGNOSIS — M1990 Unspecified osteoarthritis, unspecified site: Secondary | ICD-10-CM | POA: Diagnosis not present

## 2023-05-10 DIAGNOSIS — K259 Gastric ulcer, unspecified as acute or chronic, without hemorrhage or perforation: Secondary | ICD-10-CM | POA: Diagnosis not present

## 2023-05-10 DIAGNOSIS — F419 Anxiety disorder, unspecified: Secondary | ICD-10-CM | POA: Insufficient documentation

## 2023-05-10 DIAGNOSIS — R1013 Epigastric pain: Secondary | ICD-10-CM | POA: Diagnosis not present

## 2023-05-10 DIAGNOSIS — K31A0 Gastric intestinal metaplasia, unspecified: Secondary | ICD-10-CM | POA: Diagnosis not present

## 2023-05-10 DIAGNOSIS — D120 Benign neoplasm of cecum: Secondary | ICD-10-CM

## 2023-05-10 DIAGNOSIS — G8929 Other chronic pain: Secondary | ICD-10-CM | POA: Diagnosis not present

## 2023-05-10 HISTORY — PX: POLYPECTOMY: SHX5525

## 2023-05-10 HISTORY — PX: ESOPHAGOGASTRODUODENOSCOPY (EGD) WITH PROPOFOL: SHX5813

## 2023-05-10 HISTORY — PX: BIOPSY: SHX5522

## 2023-05-10 HISTORY — PX: COLONOSCOPY WITH PROPOFOL: SHX5780

## 2023-05-10 SURGERY — COLONOSCOPY WITH PROPOFOL
Anesthesia: General

## 2023-05-10 MED ORDER — PROPOFOL 10 MG/ML IV BOLUS
INTRAVENOUS | Status: DC | PRN
  Administered 2023-05-10: 100 mg via INTRAVENOUS

## 2023-05-10 MED ORDER — PROPOFOL 500 MG/50ML IV EMUL
INTRAVENOUS | Status: DC | PRN
  Administered 2023-05-10: 150 ug/kg/min via INTRAVENOUS

## 2023-05-10 MED ORDER — LACTATED RINGERS IV SOLN: INTRAVENOUS | Status: DC

## 2023-05-10 MED ORDER — LIDOCAINE HCL (CARDIAC) PF 100 MG/5ML IV SOSY
PREFILLED_SYRINGE | INTRAVENOUS | Status: DC | PRN
  Administered 2023-05-10: 50 mg via INTRAVENOUS

## 2023-05-10 MED ORDER — STERILE WATER FOR IRRIGATION IR SOLN
Status: DC | PRN
  Administered 2023-05-10: .6 mL

## 2023-05-10 MED ORDER — EPHEDRINE SULFATE-NACL 50-0.9 MG/10ML-% IV SOSY
PREFILLED_SYRINGE | INTRAVENOUS | Status: DC | PRN
  Administered 2023-05-10: 10 mg via INTRAVENOUS

## 2023-05-10 NOTE — Op Note (Signed)
[facility] Patient Name: Andrea Martinez Procedure Date: 05/10/2023 11:54 AM MRN: [id number] Date of Birth: 1946-07-03 Attending MD: Hennie Duos. Marletta Lor , [place], [id number] CSN: [id number] Age: 77 Admit Type: Outpatient Procedure:                Colonoscopy Indications:              Abnormal CT of the GI tract, Follow-up of colitis Providers:                Hennie Duos. Marletta Lor, DO, Angelica Ran, Edrick Kins, RN Referring MD:              Medicines:                See the Anesthesia note for documentation of the                            administered medications Complications:            No immediate complications. Estimated Blood Loss:     Estimated blood loss was minimal. Procedure:                Pre-Anesthesia Assessment:                           - The anesthesia plan was to use monitored                            anesthesia care (MAC).                           After obtaining informed consent, the colonoscope                            was passed under direct vision. Throughout the                            procedure, the patient's blood pressure, pulse, and                            oxygen saturations were monitored continuously. The                            [id number]-HQ190L ([id number]) scope was introduced through                            the anus and advanced to the the cecum, identified                            by appendiceal orifice and ileocecal valve. The                            colonoscopy was performed without difficulty. The                            patient tolerated the procedure well. The quality  of the bowel preparation was evaluated using the                            BBPS [facility] Bowel Preparation Scale) with scores                            of: Right Colon = 2 (minor amount of residual                            staining, small fragments of stool and/or opaque                            liquid, but mucosa seen well), Transverse Colon  = 2                            (minor amount of residual staining, small fragments                            of stool and/or opaque liquid, but mucosa seen                            well) and Left Colon = 2 (minor amount of residual                            staining, small fragments of stool and/or opaque                            liquid, but mucosa seen well). The total BBPS score                            equals 6. Fair. Scope In: 12:18:57 PM Scope Out: 12:38:01 PM Scope Withdrawal Time: 0 hours 12 minutes 33 seconds  Total Procedure Duration: 0 hours 19 minutes 4 seconds  Findings:      Multiple large-mouthed and small-mouthed diverticula were found in the       sigmoid colon.      A 4 mm polyp was found in the cecum. The polyp was sessile. The polyp       was removed with a cold snare. Resection and retrieval were complete.      A 4 mm polyp was found in the sigmoid colon. The polyp was sessile. The       polyp was removed with a cold snare. Resection and retrieval were       complete. Impression:               - Diverticulosis in the sigmoid colon.                           - One 4 mm polyp in the cecum, removed with a cold                            snare. Resected and retrieved.                           -  One 4 mm polyp in the sigmoid colon, removed with                            a cold snare. Resected and retrieved. Moderate Sedation:      Per Anesthesia Care Recommendation:           - Patient has a contact number available for                            emergencies. The signs and symptoms of potential                            delayed complications were discussed with the                            patient. Return to normal activities tomorrow.                            Written discharge instructions were provided to the                            patient.                           - Resume previous diet.                           - Continue present medications.                            - Await pathology results.                           - Repeat colonoscopy in 5 years for surveillance.                           - Return to GI clinic in 3 months. Procedure Code(s):        --- Professional ---                           [phone], Colonoscopy, flexible; with removal of                            tumor(s), polyp(s), or other lesion(s) by snare                            technique Diagnosis Code(s):        --- Professional ---                           D12.0, Benign neoplasm of cecum                           D12.5, Benign neoplasm of sigmoid colon  K52.9, Noninfective gastroenteritis and colitis,                            unspecified                           K57.30, Diverticulosis of large intestine without                            perforation or abscess without bleeding                           R93.3, Abnormal findings on diagnostic imaging of                            other parts of digestive tract CPT copyright 2022 American Medical Association. All rights reserved. The codes documented in this report are preliminary and upon coder review may  be revised to meet current compliance requirements. Hennie Duos. Marletta Lor, DO Hennie Duos. Marletta Lor, DO 05/10/2023 12:42:00 PM This report has been signed electronically. Number of Addenda: 0

## 2023-05-10 NOTE — Interval H&P Note (Signed)
History and Physical Interval Note:  05/10/2023 12:14 PM  Andrea Martinez  has presented today for surgery, with the diagnosis of colitis, epigastric pain, rlq pain.  The various methods of treatment have been discussed with the patient and family. After consideration of risks, benefits and other options for treatment, the patient has consented to  Procedure(s) with comments: COLONOSCOPY WITH PROPOFOL (N/A) - 1245pm, asa 3 ESOPHAGOGASTRODUODENOSCOPY (EGD) WITH PROPOFOL (N/A) BIOPSY as a surgical intervention.  The patient's history has been reviewed, patient examined, no change in status, stable for surgery.  I have reviewed the patient's chart and labs.  Questions were answered to the patient's satisfaction.     Lanelle Bal

## 2023-05-10 NOTE — Anesthesia Preprocedure Evaluation (Signed)
Anesthesia Evaluation  Patient identified by MRN, date of birth, ID band Patient awake    Reviewed: Allergy & Precautions, H&P , NPO status , Patient's Chart, lab work & pertinent test results, reviewed documented beta blocker date and time   History of Anesthesia Complications (+) PONV and history of anesthetic complications  Airway Mallampati: II  TM Distance: >3 FB Neck ROM: full    Dental no notable dental hx.    Pulmonary neg pulmonary ROS, pneumonia   Pulmonary exam normal breath sounds clear to auscultation       Cardiovascular Exercise Tolerance: Good negative cardio ROS  Rhythm:regular Rate:Normal     Neuro/Psych  PSYCHIATRIC DISORDERS Anxiety Depression    negative neurological ROS  negative psych ROS   GI/Hepatic negative GI ROS, Neg liver ROS, PUD,,,  Endo/Other  negative endocrine ROSHypothyroidism    Renal/GU negative Renal ROS  negative genitourinary   Musculoskeletal   Abdominal   Peds  Hematology negative hematology ROS (+) Blood dyscrasia, anemia   Anesthesia Other Findings   Reproductive/Obstetrics negative OB ROS                             Anesthesia Physical Anesthesia Plan  ASA: 3  Anesthesia Plan: General   Post-op Pain Management:    Induction:   PONV Risk Score and Plan: Propofol infusion  Airway Management Planned:   Additional Equipment:   Intra-op Plan:   Post-operative Plan:   Informed Consent: I have reviewed the patients History and Physical, chart, labs and discussed the procedure including the risks, benefits and alternatives for the proposed anesthesia with the patient or authorized representative who has indicated his/her understanding and acceptance.     Dental Advisory Given  Plan Discussed with: CRNA  Anesthesia Plan Comments:        Anesthesia Quick Evaluation

## 2023-05-10 NOTE — Discharge Instructions (Signed)
EGD Discharge instructions Please read the instructions outlined below and refer to this sheet in the next few weeks. These discharge instructions provide you with general information on caring for yourself after you leave the hospital. Your doctor may also give you specific instructions. While your treatment has been planned according to the most current medical practices available, unavoidable complications occasionally occur. If you have any problems or questions after discharge, please call your doctor. ACTIVITY You may resume your regular activity but move at a slower pace for the next 24 hours.  Take frequent rest periods for the next 24 hours.  Walking will help expel (get rid of) the air and reduce the bloated feeling in your abdomen.  No driving for 24 hours (because of the anesthesia (medicine) used during the test).  You may shower.  Do not sign any important legal documents or operate any machinery for 24 hours (because of the anesthesia used during the test).  NUTRITION Drink plenty of fluids.  You may resume your normal diet.  Begin with a light meal and progress to your normal diet.  Avoid alcoholic beverages for 24 hours or as instructed by your caregiver.  MEDICATIONS You may resume your normal medications unless your caregiver tells you otherwise.  WHAT YOU CAN EXPECT TODAY You may experience abdominal discomfort such as a feeling of fullness or "gas" pains.  FOLLOW-UP Your doctor will discuss the results of your test with you.  SEEK IMMEDIATE MEDICAL ATTENTION IF ANY OF THE FOLLOWING OCCUR: Excessive nausea (feeling sick to your stomach) and/or vomiting.  Severe abdominal pain and distention (swelling).  Trouble swallowing.  Temperature over 101 F (37.8 C).  Rectal bleeding or vomiting of blood.     Colonoscopy Discharge Instructions  Read the instructions outlined below and refer to this sheet in the next few weeks. These discharge instructions provide you with  general information on caring for yourself after you leave the hospital. Your doctor may also give you specific instructions. While your treatment has been planned according to the most current medical practices available, unavoidable complications occasionally occur.   ACTIVITY You may resume your regular activity, but move at a slower pace for the next 24 hours.  Take frequent rest periods for the next 24 hours.  Walking will help get rid of the air and reduce the bloated feeling in your belly (abdomen).  No driving for 24 hours (because of the medicine (anesthesia) used during the test).   Do not sign any important legal documents or operate any machinery for 24 hours (because of the anesthesia used during the test).  NUTRITION Drink plenty of fluids.  You may resume your normal diet as instructed by your doctor.  Begin with a light meal and progress to your normal diet. Heavy or fried foods are harder to digest and may make you feel sick to your stomach (nauseated).  Avoid alcoholic beverages for 24 hours or as instructed.  MEDICATIONS You may resume your normal medications unless your doctor tells you otherwise.  WHAT YOU CAN EXPECT TODAY Some feelings of bloating in the abdomen.  Passage of more gas than usual.  Spotting of blood in your stool or on the toilet paper.  IF YOU HAD POLYPS REMOVED DURING THE COLONOSCOPY: No aspirin products for 7 days or as instructed.  No alcohol for 7 days or as instructed.  Eat a soft diet for the next 24 hours.  FINDING OUT THE RESULTS OF YOUR TEST Not all test results are  available during your visit. If your test results are not back during the visit, make an appointment with your caregiver to find out the results. Do not assume everything is normal if you have not heard from your caregiver or the medical facility. It is important for you to follow up on all of your test results.  SEEK IMMEDIATE MEDICAL ATTENTION IF: You have more than a spotting of  blood in your stool.  Your belly is swollen (abdominal distention).  You are nauseated or vomiting.  You have a temperature over 101.  You have abdominal pain or discomfort that is severe or gets worse throughout the day.   Your upper endoscopy revealed small hiatal hernia.  You had a very small ulcer in the end portion of your stomach which I biopsied.  Previously noted ulcer in your small bowel has healed.  I also took samples of this region as well.  Await pathology results, office will contact you.  Avoid NSAIDs.  Overall your colon looked very healthy.  I did not see any active inflammation throughout your colon.  You did have 2 polyps which I removed.  Await pathology results, office will contact you.  Repeat colonoscopy in 5 years.  Follow-up in GI office in 2 to 3 months.  I hope you have a great rest of your week!  Hennie Duos. Marletta Lor, D.O. Gastroenterology and Hepatology [facility]

## 2023-05-10 NOTE — Transfer of Care (Signed)
Immediate Anesthesia Transfer of Care Note  Patient: Andrea Martinez  Procedure(s) Performed: COLONOSCOPY WITH PROPOFOL ESOPHAGOGASTRODUODENOSCOPY (EGD) WITH PROPOFOL BIOPSY POLYPECTOMY  Patient Location: PACU  Anesthesia Type:General  Level of Consciousness: awake, alert , and oriented  Airway & Oxygen Therapy: Patient Spontanous Breathing  Post-op Assessment: Report given to RN and Post -op Vital signs reviewed and stable  Post vital signs: Reviewed and stable  Last Vitals:  Vitals Value Taken Time  BP    Temp    Pulse    Resp    SpO2      Last Pain:  Vitals:   05/10/23 1205  TempSrc:   PainSc: 0-No pain      Patients Stated Pain Goal: 5 (05/10/23 1107)  Complications: No notable events documented.

## 2023-05-10 NOTE — Op Note (Signed)
[facility] Patient Name: Andrea Martinez Procedure Date: 05/10/2023 11:56 AM MRN: [id number] Date of Birth: 1946/03/23 Attending MD: Hennie Duos. Marletta Lor , [place], [phone] CSN: [id number] Age: 77 Admit Type: Outpatient Procedure:                Upper GI endoscopy Indications:              Epigastric abdominal pain, Heartburn Providers:                Hennie Duos. Marletta Lor, DO, Angelica Ran, Edrick Kins, RN Referring MD:              Medicines:                See the Anesthesia note for documentation of the                            administered medications Complications:            No immediate complications. Estimated Blood Loss:     Estimated blood loss was minimal. Procedure:                Pre-Anesthesia Assessment:                           - The anesthesia plan was to use monitored                            anesthesia care (MAC).                           After obtaining informed consent, the endoscope was                            passed under direct vision. Throughout the                            procedure, the patient's blood pressure, pulse, and                            oxygen saturations were monitored continuously. The                            [id number]-[id number] ([id number]) scope was introduced through the                            mouth, and advanced to the second part of duodenum.                            The upper GI endoscopy was accomplished without                            difficulty. The patient tolerated the procedure                            well. Scope In: 12:08:27 PM Scope Out: 12:14:13 PM Total Procedure Duration: 0 hours 5 minutes 46 seconds  Findings:      A small hiatal hernia was present.  Localized nodular mucosa was found in the gastric antrum. Small 2-3 mm       cratered ulcer in the region. Biopsies were taken with a cold forceps       for histology.      A healed ulcer was found in the duodenal bulb. Adjacent mucosal findings       include  congestion and edema. Biopsies were taken with a cold forceps       for histology. Impression:               - Small hiatal hernia.                           - Nodular mucosa in the gastric antrum. Biopsied.                           - Duodenal scar. Biopsied. Moderate Sedation:      Per Anesthesia Care Recommendation:           - Patient has a contact number available for                            emergencies. The signs and symptoms of potential                            delayed complications were discussed with the                            patient. Return to normal activities tomorrow.                            Written discharge instructions were provided to the                            patient.                           - Resume previous diet.                           - Continue present medications.                           - Await pathology results.                           - Use a proton pump inhibitor PO BID.                           - No ibuprofen, naproxen, or other non-steroidal                            anti-inflammatory drugs. Procedure Code(s):        --- Professional ---                           [phone], Esophagogastroduodenoscopy, flexible,  transoral; with biopsy, single or multiple Diagnosis Code(s):        --- Professional ---                           K44.9, Diaphragmatic hernia without obstruction or                            gangrene                           K31.89, Other diseases of stomach and duodenum                           R10.13, Epigastric pain                           R12, Heartburn CPT copyright 2022 American Medical Association. All rights reserved. The codes documented in this report are preliminary and upon coder review may  be revised to meet current compliance requirements. Hennie Duos. Marletta Lor, DO Hennie Duos. Marletta Lor, DO 05/10/2023 12:18:21 PM This report has been signed electronically. Number of Addenda: 0

## 2023-05-11 LAB — SURGICAL PATHOLOGY

## 2023-05-15 NOTE — Anesthesia Postprocedure Evaluation (Signed)
Anesthesia Post Note  Patient: Andrea Martinez  Procedure(s) Performed: COLONOSCOPY WITH PROPOFOL ESOPHAGOGASTRODUODENOSCOPY (EGD) WITH PROPOFOL BIOPSY POLYPECTOMY  Patient location during evaluation: Phase II Anesthesia Type: General Level of consciousness: awake Pain management: pain level controlled Vital Signs Assessment: post-procedure vital signs reviewed and stable Respiratory status: spontaneous breathing and respiratory function stable Cardiovascular status: blood pressure returned to baseline and stable Postop Assessment: no headache and no apparent nausea or vomiting Anesthetic complications: no Comments: Late entry   No notable events documented.   Last Vitals:  Vitals:   05/10/23 1107 05/10/23 1242  BP: (!) 143/76 (!) 127/56  Pulse: 74 79  Resp:  (!) 21  Temp: 36.9 C 36.8 C  SpO2: 99% 100%    Last Pain:  Vitals:   05/10/23 1242  TempSrc: Oral  PainSc: 4                  Windell Norfolk

## 2023-05-16 ENCOUNTER — Encounter (HOSPITAL_COMMUNITY): Payer: Self-pay | Admitting: Internal Medicine

## 2023-05-17 ENCOUNTER — Ambulatory Visit (INDEPENDENT_AMBULATORY_CARE_PROVIDER_SITE_OTHER): Payer: PPO | Admitting: Family Medicine

## 2023-05-17 ENCOUNTER — Encounter: Payer: Self-pay | Admitting: Family Medicine

## 2023-05-17 VITALS — BP 123/78 | HR 104 | Temp 96.0°F | Resp 20 | Ht 65.0 in | Wt 214.0 lb

## 2023-05-17 DIAGNOSIS — Z01818 Encounter for other preprocedural examination: Secondary | ICD-10-CM

## 2023-05-17 NOTE — Progress Notes (Signed)
Not seen today as surgery is not until 08/29/2023, rescheduled for the appropriate timing prior to procedure.

## 2023-05-17 NOTE — Patient Instructions (Signed)
Melatonin 10 mg nightly with Celexa

## 2023-05-22 ENCOUNTER — Other Ambulatory Visit: Payer: Self-pay | Admitting: "Endocrinology

## 2023-05-23 ENCOUNTER — Ambulatory Visit (INDEPENDENT_AMBULATORY_CARE_PROVIDER_SITE_OTHER): Payer: PPO

## 2023-05-23 VITALS — Ht 65.0 in | Wt 214.0 lb

## 2023-05-23 DIAGNOSIS — Z0000 Encounter for general adult medical examination without abnormal findings: Secondary | ICD-10-CM | POA: Diagnosis not present

## 2023-05-23 DIAGNOSIS — Z0100 Encounter for examination of eyes and vision without abnormal findings: Secondary | ICD-10-CM

## 2023-05-23 NOTE — Patient Instructions (Signed)
Andrea Martinez , Thank you for taking time to come for your Medicare Wellness Visit. I appreciate your ongoing commitment to your health goals. Please review the following plan we discussed and let me know if I can assist you in the future.   These are the goals we discussed:  Goals       Increase physical activity (pt-stated)      Get back to normal from back and knee surgeries.       Prevent falls        This is a list of the screening recommended for you and due dates:  Health Maintenance  Topic Date Due   DTaP/Tdap/Td vaccine (1 - Tdap) Never done   COVID-19 Vaccine (4 - 2023-24 season) 06/02/2023*   Zoster (Shingles) Vaccine (1 of 2) 08/17/2023*   Flu Shot  07/14/2023   Mammogram  09/11/2023   Medicare Annual Wellness Visit  05/22/2024   Pneumonia Vaccine  Completed   DEXA scan (bone density measurement)  Completed   Hepatitis C Screening  Completed   HPV Vaccine  Aged Out   Colon Cancer Screening  Discontinued  *Topic was postponed. The date shown is not the original due date.    Advanced directives: Please bring a copy of your health care power of attorney and living will to the office to be added to your chart at your convenience.   Conditions/risks identified: Aim for 30 minutes of exercise or brisk walking, 6-8 glasses of water, and 5 servings of fruits and vegetables each day.   Next appointment: Follow up in one year for your annual wellness visit    Preventive Care 65 Years and Older, Female Preventive care refers to lifestyle choices and visits with your health care provider that can promote health and wellness. What does preventive care include? A yearly physical exam. This is also called an annual well check. Dental exams once or twice a year. Routine eye exams. Ask your health care provider how often you should have your eyes checked. Personal lifestyle choices, including: Daily care of your teeth and gums. Regular physical activity. Eating a healthy  diet. Avoiding tobacco and drug use. Limiting alcohol use. Practicing safe sex. Taking low-dose aspirin every day. Taking vitamin and mineral supplements as recommended by your health care provider. What happens during an annual well check? The services and screenings done by your health care provider during your annual well check will depend on your age, overall health, lifestyle risk factors, and family history of disease. Counseling  Your health care provider may ask you questions about your: Alcohol use. Tobacco use. Drug use. Emotional well-being. Home and relationship well-being. Sexual activity. Eating habits. History of falls. Memory and ability to understand (cognition). Work and work Astronomer. Reproductive health. Screening  You may have the following tests or measurements: Height, weight, and BMI. Blood pressure. Lipid and cholesterol levels. These may be checked every 5 years, or more frequently if you are over 36 years old. Skin check. Lung cancer screening. You may have this screening every year starting at age 46 if you have a 30-pack-year history of smoking and currently smoke or have quit within the past 15 years. Fecal occult blood test (FOBT) of the stool. You may have this test every year starting at age 17. Flexible sigmoidoscopy or colonoscopy. You may have a sigmoidoscopy every 5 years or a colonoscopy every 10 years starting at age 63. Hepatitis C blood test. Hepatitis B blood test. Sexually transmitted disease (STD) testing.  Diabetes screening. This is done by checking your blood sugar (glucose) after you have not eaten for a while (fasting). You may have this done every 1-3 years. Bone density scan. This is done to screen for osteoporosis. You may have this done starting at age 67. Mammogram. This may be done every 1-2 years. Talk to your health care provider about how often you should have regular mammograms. Talk with your health care provider about  your test results, treatment options, and if necessary, the need for more tests. Vaccines  Your health care provider may recommend certain vaccines, such as: Influenza vaccine. This is recommended every year. Tetanus, diphtheria, and acellular pertussis (Tdap, Td) vaccine. You may need a Td booster every 10 years. Zoster vaccine. You may need this after age 74. Pneumococcal 13-valent conjugate (PCV13) vaccine. One dose is recommended after age 70. Pneumococcal polysaccharide (PPSV23) vaccine. One dose is recommended after age 42. Talk to your health care provider about which screenings and vaccines you need and how often you need them. This information is not intended to replace advice given to you by your health care provider. Make sure you discuss any questions you have with your health care provider. Document Released: 12/26/2015 Document Revised: 08/18/2016 Document Reviewed: 09/30/2015 Elsevier Interactive Patient Education  2017 [place] Prevention in the Home Falls can cause injuries. They can happen to people of all ages. There are many things you can do to make your home safe and to help prevent falls. What can I do on the outside of my home? Regularly fix the edges of walkways and driveways and fix any cracks. Remove anything that might make you trip as you walk through a door, such as a raised step or threshold. Trim any bushes or trees on the path to your home. Use bright outdoor lighting. Clear any walking paths of anything that might make someone trip, such as rocks or tools. Regularly check to see if handrails are loose or broken. Make sure that both sides of any steps have handrails. Any raised decks and porches should have guardrails on the edges. Have any leaves, snow, or ice cleared regularly. Use sand or salt on walking paths during winter. Clean up any spills in your garage right away. This includes oil or grease spills. What can I do in the bathroom? Use  night lights. Install grab bars by the toilet and in the tub and shower. Do not use towel bars as grab bars. Use non-skid mats or decals in the tub or shower. If you need to sit down in the shower, use a plastic, non-slip stool. Keep the floor dry. Clean up any water that spills on the floor as soon as it happens. Remove soap buildup in the tub or shower regularly. Attach bath mats securely with double-sided non-slip rug tape. Do not have throw rugs and other things on the floor that can make you trip. What can I do in the bedroom? Use night lights. Make sure that you have a light by your bed that is easy to reach. Do not use any sheets or blankets that are too big for your bed. They should not hang down onto the floor. Have a firm chair that has side arms. You can use this for support while you get dressed. Do not have throw rugs and other things on the floor that can make you trip. What can I do in the kitchen? Clean up any spills right away. Avoid walking on wet floors.  Keep items that you use a lot in easy-to-reach places. If you need to reach something above you, use a strong step stool that has a grab bar. Keep electrical cords out of the way. Do not use floor polish or wax that makes floors slippery. If you must use wax, use non-skid floor wax. Do not have throw rugs and other things on the floor that can make you trip. What can I do with my stairs? Do not leave any items on the stairs. Make sure that there are handrails on both sides of the stairs and use them. Fix handrails that are broken or loose. Make sure that handrails are as long as the stairways. Check any carpeting to make sure that it is firmly attached to the stairs. Fix any carpet that is loose or worn. Avoid having throw rugs at the top or bottom of the stairs. If you do have throw rugs, attach them to the floor with carpet tape. Make sure that you have a light switch at the top of the stairs and the bottom of the  stairs. If you do not have them, ask someone to add them for you. What else can I do to help prevent falls? Wear shoes that: Do not have high heels. Have rubber bottoms. Are comfortable and fit you well. Are closed at the toe. Do not wear sandals. If you use a stepladder: Make sure that it is fully opened. Do not climb a closed stepladder. Make sure that both sides of the stepladder are locked into place. Ask someone to hold it for you, if possible. Clearly mark and make sure that you can see: Any grab bars or handrails. First and last steps. Where the edge of each step is. Use tools that help you move around (mobility aids) if they are needed. These include: Canes. Walkers. Scooters. Crutches. Turn on the lights when you go into a dark area. Replace any light bulbs as soon as they burn out. Set up your furniture so you have a clear path. Avoid moving your furniture around. If any of your floors are uneven, fix them. If there are any pets around you, be aware of where they are. Review your medicines with your doctor. Some medicines can make you feel dizzy. This can increase your chance of falling. Ask your doctor what other things that you can do to help prevent falls. This information is not intended to replace advice given to you by your health care provider. Make sure you discuss any questions you have with your health care provider. Document Released: 09/25/2009 Document Revised: 05/06/2016 Document Reviewed: 01/03/2015 Elsevier Interactive Patient Education  2017 [employer].

## 2023-05-23 NOTE — Progress Notes (Signed)
Subjective:   Andrea Martinez is a 77 y.o. female who presents for Medicare Annual (Subsequent) preventive examination. I connected with  Andrea Martinez on 05/23/23 by a audio enabled telemedicine application and verified that I am speaking with the correct person using two identifiers.  Patient Location: Home  Provider Location: Home Office  I discussed the limitations of evaluation and management by telemedicine. The patient expressed understanding and agreed to proceed.  Review of Systems     Cardiac Risk Factors include: advanced age (>21men, >62 women)     Objective:    Today's Vitals   05/23/23 0825  Weight: 214 lb (97.1 kg)  Height: 5\' 5"  (1.651 m)   Body mass index is 35.61 kg/m.     05/23/2023    8:32 AM 05/10/2023   11:20 AM 05/05/2023   10:01 AM 05/04/2023    2:02 PM 02/02/2023    3:13 PM 07/20/2022    2:00 PM 06/28/2022    6:44 AM  Advanced Directives  Does Patient Have a Medical Advance Directive? Yes Yes Yes Yes Yes Yes No  Type of Estate agent of [place];Living will Healthcare Power of [place];Living will Healthcare Power of [place];Living will Healthcare Power of [place];Living will Healthcare Power of [place];Living will Healthcare Power of [place];Living will   Does patient want to make changes to medical advance directive?     No - Patient declined No - Patient declined   Copy of Healthcare Power of Attorney in Chart? No - copy requested No - copy requested No - copy requested No - copy requested No - copy requested No - copy requested   Would patient like information on creating a medical advance directive?       No - Patient declined    Current Medications (verified) Outpatient Encounter Medications as of 05/23/2023  Medication Sig   acetaminophen (TYLENOL) 500 MG tablet Take 1,000 mg by mouth every 8 (eight) hours as needed for moderate pain.   cetirizine (ZYRTEC) 10 MG tablet Take 10 mg by mouth daily.   citalopram (CELEXA) 20  MG tablet TAKE 1 TABLET BY MOUTH EVERY DAY   lactose free nutrition (BOOST) LIQD Take 237 mLs by mouth daily.   levothyroxine (SYNTHROID) 88 MCG tablet TAKE 1 TABLET BY MOUTH DAILY BEFORE BREAKFAST.   Multiple Vitamins-Minerals (CENTRUM SILVER PO) Take 1 tablet by mouth daily in the afternoon.   nilotinib (TASIGNA) 150 MG capsule Take 2 capsules (300 mg total) by mouth every 12 (twelve) hours.   Probiotic Product (DIGESTIVE ADV PREBIOT+PROBIOT) CHEW Chew 2 each by mouth daily.   No facility-administered encounter medications on file as of 05/23/2023.    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 Leslie Martinez   Lipoma    left upper arm   Mixed hyperlipidemia    Osteoporosis    Pericardial effusion    a. HCAP complicated by pericardial effusion requiring pericardial window 10/2016 and large L pleural effusion requring VATS.   Pleural effusion    a. s/p VATS 2017.   Pneumonia 10/2016   PONV (postoperative nausea and vomiting)  history of   Prolonged QT interval    Urine incontinence    UTI (lower urinary tract infection)    frequent   Vitamin D deficiency    Past Surgical History:  Procedure Laterality Date   BIOPSY  03/20/2022   Procedure: BIOPSY;  Surgeon: Lanelle Bal, DO;  [facility];  Service: Endoscopy;;  gastric lesion vs. gastric ulcer   BIOPSY  06/28/2022   Procedure: BIOPSY;  Surgeon: Lanelle Bal, DO;  [facility];  Service: Endoscopy;;   BIOPSY  05/10/2023   Procedure: BIOPSY;  Surgeon: Lanelle Bal, DO;  [facility];  Service: Endoscopy;;    BRAVO PH STUDY  12/04/2012   Procedure: BRAVO PH STUDY;  Surgeon: West Bali, MD;  [facility];  Service: Endoscopy;  Laterality: N/A;   CHOLECYSTECTOMY N/A 01/03/2014   Procedure: LAPAROSCOPIC CHOLECYSTECTOMY WITH INTRAOPERATIVE CHOLANGIOGRAM;  Surgeon: Clovis Pu. Cornett, MD;  Location: [facility];  Service: General;  Laterality: N/A;   COLONOSCOPY  01/2004   [facility], few small tics   COLONOSCOPY WITH PROPOFOL N/A 05/10/2023   Procedure: COLONOSCOPY WITH PROPOFOL;  Surgeon: Lanelle Bal, DO;  [facility];  Service: Endoscopy;  Laterality: N/A;  1245pm, asa 3   ESOPHAGOGASTRODUODENOSCOPY  02/2010   gastric ulcers   ESOPHAGOGASTRODUODENOSCOPY  12/04/2012   [id number] gastritis (inflammation) was found in the gastric antrum; multiple biopsies The duodenal mucosa showed no abnormalities in the bulb and second portion of the duodenum   ESOPHAGOGASTRODUODENOSCOPY (EGD) WITH PROPOFOL N/A 03/20/2022   Procedure: ESOPHAGOGASTRODUODENOSCOPY (EGD) WITH PROPOFOL;  Surgeon: Lanelle Bal, DO;  [facility];  Service: Endoscopy;  Laterality: N/A;   ESOPHAGOGASTRODUODENOSCOPY (EGD) WITH PROPOFOL N/A 06/28/2022   Procedure: ESOPHAGOGASTRODUODENOSCOPY (EGD) WITH PROPOFOL;  Surgeon: Lanelle Bal, DO;  [facility];  Service: Endoscopy;  Laterality: N/A;  800   ESOPHAGOGASTRODUODENOSCOPY (EGD) WITH PROPOFOL N/A 05/10/2023   Procedure: ESOPHAGOGASTRODUODENOSCOPY (EGD) WITH PROPOFOL;  Surgeon: Lanelle Bal, DO;  [facility];  Service: Endoscopy;  Laterality: N/A;   JOINT REPLACEMENT Right 10/22/2019   KYPHOPLASTY N/A 02/14/2019   Procedure: KYPHOPLASTY T12 and L1;  Surgeon: [name], MD;  Location: [facility];  Service: Orthopedics;  Laterality: N/A;  120 mins   LIPOMA EXCISION  08/02/2011   left shoulder   NOSE SURGERY     PARTIAL HYSTERECTOMY     vaginal at age 11 years of age   POLYPECTOMY  05/10/2023   Procedure:  POLYPECTOMY;  Surgeon: Lanelle Bal, DO;  [facility];  Service: Endoscopy;;   TOE DEBRIDEMENT Left 1962   lt great toe   TOTAL HIP ARTHROPLASTY Right 11/18/2021   Procedure: TOTAL HIP ARTHROPLASTY ANTERIOR APPROACH;  Surgeon: Ollen Gross, MD;  Location: [facility];  Service: Orthopedics;  Laterality: Right;   TOTAL KNEE ARTHROPLASTY Right 10/22/2019   Procedure: TOTAL KNEE ARTHROPLASTY;  Surgeon: Ollen Gross, MD;  Location: [facility];  Service: Orthopedics;  Laterality: Right;    TOTAL KNEE ARTHROPLASTY Left 03/03/2020   Procedure: TOTAL KNEE ARTHROPLASTY;  Surgeon: Ollen Gross, MD;  Location: [facility];  Service: Orthopedics;  Laterality: Left;    TRANSFORAMINAL LUMBAR INTERBODY FUSION (TLIF) WITH PEDICLE SCREW FIXATION 1 LEVEL N/A 01/28/2021   Procedure: TRANSFORAMINAL LUMBAR INTERBODY FUSION (TLIF) LUMBAR FIVE-SACRAL ONE;  Surgeon: [name], MD;  Location[facility];  Service: Orthopedics;  Laterality: N/A;  4 hrs  VIDEO ASSISTED THORACOSCOPY (VATS)/EMPYEMA Left 10/25/2016   Procedure: VIDEO ASSISTED THORACOSCOPY (VATS), BRONCH,DRAINAGE OF PLEURAL EFFUSION,PERICARDIAL WINDOW WITH DRAINAGE OF PERICARDIAL FLUID, TEE;  Surgeon: Loreli Slot, MD;  Location: [facility];  Service: Thoracic;  Laterality: Left;   VIDEO BRONCHOSCOPY N/A 10/25/2016   Procedure: VIDEO BRONCHOSCOPY;  Surgeon: Loreli Slot, MD;  Location: [facility];  Service: Thoracic;  Laterality: N/A;   Family History  Problem Relation Age of Onset   Lung cancer Father    COPD Mother    Stroke Mother    Thyroid disease Mother    Colon cancer Neg Hx    Anesthesia problems Neg Hx    Hypotension Neg Hx    Malignant hyperthermia Neg Hx    Pseudochol deficiency Neg Hx    Social History   Socioeconomic History   Marital status: Married    Spouse name: Greggory Stallion   Number of children: 2   Years of education: 12th grade   Highest education level: Not on file  Occupational History   Occupation:  retired    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   2 granddaughters.   Retired HR.    Social Determinants of Health   Financial Resource Strain: Low Risk  (05/23/2023)   Overall Financial Resource Strain (CARDIA)    Difficulty of Paying Living Expenses: Not hard at all  Food Insecurity: No Food Insecurity (05/23/2023)   Hunger Vital Sign    Worried About Running Out of Food in the Last Year: Never true    Ran Out of Food in the Last Year: Never true  Transportation Needs: No Transportation Needs (05/23/2023)   PRAPARE - Administrator, Civil Service (Medical): No    Lack of Transportation (Non-Medical): No  Physical Activity: Insufficiently Active (05/23/2023)   Exercise Vital Sign    Days of Exercise per Week: 3 days    Minutes of Exercise per Session: 30 min  Stress: No Stress Concern Present (05/23/2023)   [employer] of Occupational Health - Occupational Stress Questionnaire    Feeling of Stress : Not at all  Social Connections: Moderately Integrated (05/23/2023)   Social Connection and Isolation Panel [NHANES]    Frequency of Communication with Friends and Family: More than three times a week    Frequency of Social Gatherings with Friends and Family: More than three times a week    Attends Religious Services: More than 4 times per year    Active Member of [employer] or Organizations: No    Attends Engineer, structural: Never    Marital Status: Married    Tobacco Counseling Counseling given: Not Answered   Clinical Intake:  Pre-visit preparation  completed: Yes  Pain : No/denies pain     Nutritional Risks: None Diabetes: No  How often do you need to have someone help you when you read instructions, pamphlets, or other written materials from your doctor or pharmacy?: 1 - Never  Diabetic?no   Interpreter Needed?: No  Information entered by :: Renie Ora, LPN   Activities of Daily Living    05/23/2023    8:33 AM 05/05/2023   10:06 AM  In your present state of health, do you have any difficulty performing the following activities:  Hearing? 0   Vision? 0   Difficulty concentrating or making decisions? 0   Walking or climbing stairs? 0   Dressing or bathing? 0   Doing errands, shopping? 0 0  Preparing Food and eating ? N   Using the Toilet? N   In the past six months, have you accidently leaked urine? N   Do you have problems with loss of bowel control? N   Managing your Medications? N   Managing your Finances? N   Housekeeping or managing your Housekeeping? N     Patient Care Team: [name], FNP as PCP - General (Family Medicine) [name], MD as Medical Oncologist (Medical Oncology) [name], MD as Consulting Physician (Endocrinology) [name], MD as Consulting Physician (Orthopedic Surgery)  Indicate any recent Medical Services you may have received from other than [facility] providers in the past year (date may be approximate).     Assessment:   This is a routine wellness examination for [facility].  Hearing/Vision screen Vision Screening - Comments:: Referral 05/23/2023  Dietary issues and exercise activities discussed: Current Exercise Habits: Home exercise routine, Type of exercise: walking, Time (Minutes): 30, Frequency (Times/Week): 3, Weekly Exercise (Minutes/Week): 90, Exercise limited by: orthopedic condition(s)   Goals Addressed             This Visit's Progress    Prevent falls   On track      Depression Screen    05/23/2023    8:31 AM 05/17/2023    9:48 AM  03/10/2023    8:09 AM 10/22/2022   10:20 AM 06/22/2022   11:07 AM 05/17/2022    8:23 AM 04/28/2022    1:47 PM  PHQ 2/9 Scores  PHQ - 2 Score 0 1 2 2 1  0   PHQ- 9 Score 0 5 7 7 5  0   Exception Documentation       Patient refusal    Fall Risk    05/23/2023    8:27 AM 05/17/2023    9:48 AM 03/10/2023    8:09 AM 10/22/2022   10:20 AM 06/22/2022   11:07 AM  Fall Risk   Falls in the past year? 0 0 0 0 0  Number falls in past yr: 0      Injury with Fall? 0      Risk for fall due to : No Fall Risks      Follow up Falls prevention discussed        FALL RISK PREVENTION PERTAINING TO THE HOME:  Any stairs in or around the home? Yes  If so, are there any without handrails? No  Home free of loose throw rugs in walkways, pet beds, electrical cords, etc? Yes  Adequate lighting in your home to reduce risk of falls? Yes   ASSISTIVE DEVICES UTILIZED TO PREVENT FALLS:  Life alert? No  Use of a cane, walker or w/c? No  Grab bars in the bathroom? Yes  Shower chair or bench in shower? Yes  Elevated toilet seat or a handicapped toilet? Yes          05/23/2023    8:33 AM 05/17/2022    8:29 AM 05/14/2021    8:36 AM  6CIT Screen  What  Year? 0 points 0 points 0 points  What month? 0 points 0 points 0 points  What time? 0 points 0 points 0 points  Count back from 20 0 points 0 points 0 points  Months in reverse 0 points 0 points 0 points  Repeat phrase 0 points 0 points 0 points  Total Score 0 points 0 points 0 points    Immunizations Immunization History  Administered Date(s) Administered   Fluad Quad(high Dose 65+) 09/11/2020, 09/24/2021   Influenza,inj,Quad PF,6+ Mos 09/13/2017   Influenza-Unspecified 09/24/2015, 11/24/2017   Moderna Sars-Covid-2 Vaccination 04/02/2020, 04/30/2020, 01/07/2021   Pneumococcal Conjugate-13 10/09/2014   Pneumococcal Polysaccharide-23 10/09/2014    TDAP status: Due, Education has been provided regarding the importance of this vaccine. Advised may receive  this vaccine at local pharmacy or Health Dept. Aware to provide a copy of the vaccination record if obtained from local pharmacy or Health Dept. Verbalized acceptance and understanding.  Flu Vaccine status: Up to date  Pneumococcal vaccine status: Up to date  Covid-19 vaccine status: Completed vaccines  Qualifies for Shingles Vaccine? Yes   Zostavax completed No   Shingrix Completed?: No.    Education has been provided regarding the importance of this vaccine. Patient has been advised to call insurance company to determine out of pocket expense if they have not yet received this vaccine. Advised may also receive vaccine at local pharmacy or Health Dept. Verbalized acceptance and understanding.  Screening Tests Health Maintenance  Topic Date Due   DTaP/Tdap/Td (1 - Tdap) Never done   COVID-19 Vaccine (4 - 2023-24 season) 06/02/2023 (Originally 08/13/2022)   Zoster Vaccines- Shingrix (1 of 2) 08/17/2023 (Originally 01/13/1965)   INFLUENZA VACCINE  07/14/2023   MAMMOGRAM  09/11/2023   Medicare Annual Wellness (AWV)  05/22/2024   Pneumonia Vaccine 50+ Years old  Completed   DEXA SCAN  Completed   Hepatitis C Screening  Completed   HPV VACCINES  Aged Out   Colonoscopy  Discontinued    Health Maintenance  Health Maintenance Due  Topic Date Due   DTaP/Tdap/Td (1 - Tdap) Never done    Colorectal cancer screening: Type of screening: Colonoscopy. Completed 05/10/2023. Repeat every 10 years  Mammogram status: Completed 09/10/2022. Repeat every year  Bone Density status: Completed 05/08/2019. Results reflect: Bone density results: OSTEOPENIA. Repeat every 5 years.  Lung Cancer Screening: (Low Dose CT Chest recommended if Age 27-80 years, 30 pack-year currently smoking OR have quit w/in 15years.) does not qualify.   Lung Cancer Screening Referral: n/a  Additional Screening:  Hepatitis C Screening: does not qualify;   Vision Screening: Recommended annual ophthalmology exams for early  detection of glaucoma and other disorders of the eye. Is the patient up to date with their annual eye exam?  No  Who is the provider or what is the name of the office in which the patient attends annual eye exams? None referral 05/23/2023 If pt is not established with a provider, would they like to be referred to a provider to establish care? No .   Dental Screening: Recommended annual dental exams for proper oral hygiene  Community Resource Referral / Chronic Care Management: CRR required this visit?  No   CCM required this visit?  No      Plan:     I have personally reviewed and noted the following in the patient's chart:   Medical and social history Use of alcohol, tobacco or illicit drugs  Current medications and supplements including opioid prescriptions. Patient is not  currently taking opioid prescriptions. Functional ability and status Nutritional status Physical activity Advanced directives List of other physicians Hospitalizations, surgeries, and ER visits in previous 12 months Vitals Screenings to include cognitive, depression, and falls Referrals and appointments  In addition, I have reviewed and discussed with patient certain preventive protocols, quality metrics, and best practice recommendations. A written personalized care plan for preventive services as well as general preventive health recommendations were provided to patient.     Lorrene Reid, LPN   1/61/0960   Nurse Notes: Due TDAP Vaccine

## 2023-06-21 NOTE — H&P (Signed)
TOTAL HIP ADMISSION H&P  Patient is admitted for left total hip arthroplasty.  Subjective:  Chief Complaint: Left hip pain  HPI: Andrea Martinez, 77 y.o. female, has a history of pain and functional disability in the left hip due to arthritis and patient has failed non-surgical conservative treatments for greater than 12 weeks to include corticosteriod injections, use of assistive devices, and activity modification. Onset of symptoms was gradual, starting  several  years ago with gradually worsening course since that time. The patient noted no past surgery on the left hip. Patient currently rates pain in the left hip at 7 out of 10 with activity. Patient has night pain, worsening of pain with activity and weight bearing, pain that interfers with activities of daily living, and pain with passive range of motion. Patient has evidence of bone-on-bone in the lateral and patellofemoral compartments. by imaging studies. This condition presents safety issues increasing the risk of falls. There is no current active infection.  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 02/28/2022   Major depressive disorder, single episode, mild degree (HCC) 02/26/2022   Perforation of duodenum (HCC) 02/17/2022   Duodenitis 02/17/2022   Diarrhea    Pain in joint of right hip 01/01/2022   OA (osteoarthritis) of hip 11/18/2021   Primary osteoarthritis of right hip 11/18/2021   S/P lumbar fusion 01/28/2021   Mixed hyperlipidemia    Atherosclerosis of aorta (HCC) 09/14/2020   Anxiety 09/14/2020   Chronic low back pain 09/14/2020   Thoracic compression fracture (HCC) 02/14/2019   Sweating  abnormality 10/11/2017   Iron deficiency anemia 02/16/2017   Solitary pulmonary nodule 10/23/2016   Bladder prolapse, female, acquired 11/21/2015   IBS (irritable bowel syndrome) 11/21/2015   Neutrophilic leukocytosis 11/21/2015   OA (osteoarthritis) of knee 11/21/2015   CML (chronic myeloid leukemia) (HCC) 11/20/2015   Dyspepsia 11/30/2012   EPIGASTRIC PAIN 02/12/2010   Hypothyroidism 02/11/2010   Nausea and vomiting 02/11/2010   Continuous severe abdominal pain 02/11/2010    Past Medical History:  Diagnosis Date   Anxiety    denies   Arthritis    Chronic myelogenous leukemia (CML), 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 [name]   Lipoma    left upper arm   Mixed hyperlipidemia    Osteoporosis    Pericardial effusion    a. HCAP complicated by pericardial effusion requiring pericardial window 10/2016 and large L pleural effusion requring VATS.   Pleural effusion    a. s/p VATS 2017.   Pneumonia 10/2016   PONV (postoperative nausea and vomiting)    history of   Prolonged QT interval    Urine incontinence    UTI (lower urinary tract infection)    frequent   Vitamin D deficiency     Past Surgical History:  Procedure Laterality Date   BIOPSY  03/20/2022   Procedure: BIOPSY;  Surgeon: Lanelle Bal, DO;  [facility]: [facility];  Service: Endoscopy;;  gastric lesion vs. gastric ulcer   BIOPSY  06/28/2022  Procedure: BIOPSY;  Surgeon: Lanelle Bal, DO;  [facility]: [facility];  Service: Endoscopy;;   BIOPSY  05/10/2023   Procedure: BIOPSY;  Surgeon: Lanelle Bal, DO;  [facility]: [facility];  Service: Endoscopy;;   BRAVO PH STUDY  12/04/2012   Procedure: BRAVO PH STUDY;  Surgeon: West Bali, MD;  [facility]: [facility];  Service: Endoscopy;  Laterality: N/A;   CHOLECYSTECTOMY N/A 01/03/2014   Procedure: LAPAROSCOPIC CHOLECYSTECTOMY WITH  INTRAOPERATIVE CHOLANGIOGRAM;  Surgeon: Clovis Pu. Cornett, MD;  Location: [facility];  Service: General;  Laterality: N/A;   COLONOSCOPY  01/2004   [facility], few small tics   COLONOSCOPY WITH PROPOFOL N/A 05/10/2023   Procedure: COLONOSCOPY WITH PROPOFOL;  Surgeon: Lanelle Bal, DO;  [facility]: [facility];  Service: Endoscopy;  Laterality: N/A;  1245pm, asa 3   ESOPHAGOGASTRODUODENOSCOPY  02/2010   gastric ulcers   ESOPHAGOGASTRODUODENOSCOPY  12/04/2012   NFA:[id number] gastritis (inflammation) was found in the gastric antrum; multiple biopsies The duodenal mucosa showed no abnormalities in the bulb and second portion of the duodenum   ESOPHAGOGASTRODUODENOSCOPY (EGD) WITH PROPOFOL N/A 03/20/2022   Procedure: ESOPHAGOGASTRODUODENOSCOPY (EGD) WITH PROPOFOL;  Surgeon: Lanelle Bal, DO;  [facility];  Service: Endoscopy;  Laterality: N/A;   ESOPHAGOGASTRODUODENOSCOPY (EGD) WITH PROPOFOL N/A 06/28/2022   Procedure: ESOPHAGOGASTRODUODENOSCOPY (EGD) WITH PROPOFOL;  Surgeon: Lanelle Bal, DO;  [facility];  Service: Endoscopy;  Laterality: N/A;  800   ESOPHAGOGASTRODUODENOSCOPY (EGD) WITH PROPOFOL N/A 05/10/2023   Procedure: ESOPHAGOGASTRODUODENOSCOPY (EGD) WITH PROPOFOL;  Surgeon: Lanelle Bal, DO;  [facility];  Service: Endoscopy;  Laterality: N/A;   JOINT REPLACEMENT Right 10/22/2019   KYPHOPLASTY N/A 02/14/2019   Procedure: KYPHOPLASTY T12 and L1;  Surgeon: [name], MD;  Location: [facility];  Service: Orthopedics;  Laterality: N/A;  120 mins   LIPOMA EXCISION  08/02/2011   left shoulder   NOSE SURGERY     PARTIAL HYSTERECTOMY     vaginal at age 73 years of age   POLYPECTOMY  05/10/2023   Procedure: POLYPECTOMY;  Surgeon: Lanelle Bal, DO;  [facility]: [facility];  Service: Endoscopy;;   TOE DEBRIDEMENT Left 1962   lt great toe   TOTAL HIP ARTHROPLASTY Right 11/18/2021   Procedure: TOTAL HIP ARTHROPLASTY ANTERIOR APPROACH;   Surgeon: Ollen Gross, MD;  Location: [facility];  Service: Orthopedics;  Laterality: Right;   TOTAL KNEE ARTHROPLASTY Right 10/22/2019   Procedure: TOTAL KNEE ARTHROPLASTY;  Surgeon: Ollen Gross, MD;  Location: [facility];  Service: Orthopedics;  Laterality: Right;    TOTAL KNEE ARTHROPLASTY Left 03/03/2020   Procedure: TOTAL KNEE ARTHROPLASTY;  Surgeon: Ollen Gross, MD;  Location: [facility];  Service: Orthopedics;  Laterality: Left;    TRANSFORAMINAL LUMBAR INTERBODY FUSION (TLIF) WITH PEDICLE SCREW FIXATION 1 LEVEL N/A 01/28/2021   Procedure: TRANSFORAMINAL LUMBAR INTERBODY FUSION (TLIF) LUMBAR FIVE-SACRAL ONE;  Surgeon: [name], MD;  Location: [facility];  Service: Orthopedics;  Laterality: N/A;  4 hrs   VIDEO ASSISTED THORACOSCOPY (VATS)/EMPYEMA Left 10/25/2016   Procedure: VIDEO ASSISTED THORACOSCOPY (VATS), BRONCH,DRAINAGE OF PLEURAL EFFUSION,PERICARDIAL WINDOW WITH DRAINAGE OF PERICARDIAL FLUID, TEE;  Surgeon: Loreli Slot, MD;  Location: [facility];  Service: Thoracic;  Laterality: Left;   VIDEO BRONCHOSCOPY N/A 10/25/2016   Procedure: VIDEO BRONCHOSCOPY;  Surgeon: Loreli Slot, MD;  Location: [facility];  Service: Thoracic;  Laterality: N/A;    Prior to Admission medications  Medication Sig Start Date End Date Taking? Authorizing Provider  acetaminophen (TYLENOL) 500 MG tablet Take 1,000 mg by mouth every 8 (eight) hours as needed for moderate pain.   Yes [provider]  cetirizine (ZYRTEC) 10 MG tablet Take 10 mg by mouth daily.   Yes [provider]  citalopram (CELEXA) 20 MG tablet TAKE 1 TABLET BY MOUTH EVERY DAY 04/07/23  Yes Rakes, Doralee Albino, FNP  ibuprofen (ADVIL) 200 MG tablet Take 200 mg by mouth daily as needed for moderate pain.   Yes [provider]  lactose free nutrition (BOOST) LIQD Take 237 mLs by mouth daily.   Yes [provider]  levothyroxine (SYNTHROID) 88 MCG tablet TAKE 1 TABLET BY MOUTH EVERY DAY BEFORE BREAKFAST  05/23/23  Yes Nida, Denman George, MD  Multiple Vitamins-Minerals (CENTRUM SILVER PO) Take 1 tablet by mouth daily in the afternoon.   Yes [provider]  nilotinib (TASIGNA) 150 MG capsule Take 2 capsules (300 mg total) by mouth every 12 (twelve) hours. 06/29/22  Yes [name], MD  Probiotic Product (DIGESTIVE ADV PREBIOT+PROBIOT) CHEW Chew 2 each by mouth daily.   Yes [provider]    Allergies  Allergen Reactions   Nucynta [Tapentadol Hcl] Swelling   Codeine Nausea Only and Other (See Comments)    Headache   Sulfonamide Derivatives Nausea Only and Other (See Comments)    Headache   Nexium [Esomeprazole Magnesium] Nausea Only   Sulfa Antibiotics Nausea Only    Headache    Fluconazole Rash    Redness and blistering on left thigh    Social History   Socioeconomic History   Marital status: Married    Spouse name: Greggory Stallion   Number of children: 2   Years of education: 12th grade   Highest education level: Not on file  Occupational History   Occupation: retired    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   2 granddaughters.   Retired HR.    Social Determinants of Health   Financial Resource Strain: Low Risk  (05/23/2023)   Overall Financial Resource Strain (CARDIA)    Difficulty of Paying Living Expenses: Not hard at all  Food Insecurity: No Food Insecurity (05/23/2023)   Hunger Vital Sign    Worried About Running Out of Food in the Last Year: Never true    Ran Out of Food in the Last Year: Never true  Transportation Needs: No Transportation  Needs (05/23/2023)   PRAPARE - Administrator, Civil Service (Medical): No    Lack of Transportation (Non-Medical): No  Physical Activity: Insufficiently Active (05/23/2023)   Exercise Vital Sign    Days of Exercise per Week: 3 days    Minutes of Exercise per Session: 30 min  Stress: No Stress Concern Present (05/23/2023)   [employer] of Occupational Health - Occupational Stress Questionnaire    Feeling of Stress : Not at all  Social Connections: Moderately Integrated (05/23/2023)   Social Connection and Isolation  Panel [NHANES]    Frequency of Communication with Friends and Family: More than three times a week    Frequency of Social Gatherings with Friends and Family: More than three times a week    Attends Religious Services: More than 4 times per year    Active Member of [employer] or Organizations: No    Attends Banker Meetings: Never    Marital Status: Married  Catering manager Violence: Not At Risk (05/23/2023)   Humiliation, Afraid, Rape, and Kick questionnaire    Fear of Current or Ex-Partner: No    Emotionally Abused: No    Physically Abused: No    Sexually Abused: No    Tobacco Use: Low Risk  (05/23/2023)   Patient History    Smoking Tobacco Use: Never    Smokeless Tobacco Use: Never    Passive Exposure: Never   Social History   Substance and Sexual Activity  Alcohol Use No   Alcohol/week: 0.0 standard drinks of alcohol    Family History  Problem Relation Age of Onset   Lung cancer Father    COPD Mother    Stroke Mother    Thyroid disease Mother    Colon cancer Neg Hx    Anesthesia problems Neg Hx    Hypotension Neg Hx    Malignant hyperthermia Neg Hx    Pseudochol deficiency Neg Hx     ROS   Objective:  Physical Exam: Well nourished and well developed.  General: Alert and oriented x3, cooperative and pleasant, no acute distress.  Head: normocephalic, atraumatic, neck supple.  Eyes: EOMI.  Musculoskeletal:  Evaluation of  her left hip shows flexion to 90, no internal rotation, about 10 of external rotation, 10 of abduction.  Calves soft and nontender. Motor function intact in LE. Strength 5/5 LE bilaterally. Neuro: Distal pulses 2+. Sensation to light touch intact in LE.   Imaging Review Plain radiographs demonstrate severe degenerative joint disease of the left hip. The bone quality appears to be adequate for age and reported activity level.  Assessment/Plan:  End stage arthritis, left hip  The patient history, physical examination, clinical judgement of the provider and imaging studies are consistent with end stage degenerative joint disease of the left hip and total hip arthroplasty is deemed medically necessary. The treatment options including medical management, injection therapy, arthroscopy and arthroplasty were discussed at length. The risks and benefits of total hip arthroplasty were presented and reviewed. The risks due to aseptic loosening, infection, stiffness, dislocation/subluxation, thromboembolic complications and other imponderables were discussed. The patient acknowledged the explanation, agreed to proceed with the plan and consent was signed. Patient is being admitted for inpatient treatment for surgery, pain control, PT, OT, prophylactic antibiotics, VTE prophylaxis, progressive ambulation and ADLs and discharge planning.The patient is planning to be discharged  home .   Patient's anticipated LOS is less than 2 midnights, meeting these requirements: - Lives within 1 hour of care - Has a competent adult at home to recover with post-op recover - NO history of  - Chronic pain requiring opioids  - Diabetes  - Coronary Artery Disease  - Heart failure  - Heart attack  - Stroke  - DVT/VTE  - Cardiac arrhythmia  - Respiratory Failure/COPD  - Renal failure  - Anemia  - Advanced Liver disease  Therapy Plans: HEP Disposition: Home with husband Planned DVT Prophylaxis: Xarelto 10 mg QD DME  Needed: Dan Humphreys PCP: Gilford Silvius, FNP Oncologist: [name], MD (clearance received) Endocrinologist: Purcell Nails, MD (  clearance received) TXA: IV Allergies: Codeine (tolerated hydrocodone with previous THA) Anesthesia Concerns: None BMI: 36.2 Last HgbA1c: Not diabetic Pain Regimen: Hydrocodone [facility]acy: [facility] [place] [facility])  Other: - Appt with Gilford Silvius tomorrow for clearance - PMH: CML on Tasigna - Will need to verify if xarelto 10 mg is safe with tasigna (can increase blood levels of xarelto). Will check with pharmacy. Pt was told not to take ASA with med.  - Patient was instructed on what medications to stop prior to surgery. - Follow-up visit in 2 weeks with Dr. Lequita Halt - Begin physical therapy following surgery - Pre-operative lab work as pre-surgical testing - Prescriptions will be provided in hospital at time of discharge  Arther Abbott, PA-C Orthopedic Surgery [facility] [place]

## 2023-06-22 ENCOUNTER — Encounter: Payer: Self-pay | Admitting: Family Medicine

## 2023-06-22 ENCOUNTER — Ambulatory Visit (INDEPENDENT_AMBULATORY_CARE_PROVIDER_SITE_OTHER): Payer: PPO | Admitting: Family Medicine

## 2023-06-22 VITALS — BP 137/68 | HR 72 | Temp 97.6°F | Resp 18 | Wt 216.6 lb

## 2023-06-22 DIAGNOSIS — Z01818 Encounter for other preprocedural examination: Secondary | ICD-10-CM | POA: Diagnosis not present

## 2023-06-22 LAB — BAYER DCA HB A1C WAIVED: HB A1C (BAYER DCA - WAIVED): 5.2 % (ref 4.8–5.6)

## 2023-06-22 NOTE — Progress Notes (Signed)
Subjective:  Patient ID: Andrea Martinez, female    DOB: January 21, 1946, 77 y.o.   MRN: [id number]  Patient Care Team: [name], FNP as PCP - General (Family Medicine) [name], MD as Medical Oncologist (Medical Oncology) [name], MD as Consulting Physician (Endocrinology) [name], MD as Consulting Physician (Orthopedic Surgery)   Chief Complaint:  Pre-op Exam   HPI: Andrea Martinez is a 77 y.o. female presenting on 06/22/2023 for Pre-op Exam   Pt presents today for preoperative examination. EKG completed 04/2023, reviewed, no acute changes. She is scheduled to have a left total hip replacement by Dr. Lequita Halt. She has had several surgeries in the past and did well. She has ongoing left hip pain that seems to be worsening, does radiate across buttocks and down left leg. States she has seen her GI and oncologist who have cleared her for procedure.      Relevant past medical, surgical, family, and social history reviewed and updated as indicated.  Allergies and medications reviewed and updated. Data reviewed: Chart in Epic.   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 [name]   Lipoma    left upper arm   Mixed hyperlipidemia    Osteoporosis    Pericardial effusion    a. HCAP complicated by pericardial effusion requiring pericardial window 10/2016 and large L pleural effusion requring VATS.   Pleural effusion    a. s/p VATS 2017.   Pneumonia 10/2016   PONV (postoperative nausea and vomiting)    history of   Prolonged QT interval    Urine incontinence    UTI (lower urinary tract infection)    frequent   Vitamin D deficiency     Past Surgical History:  Procedure Laterality Date   BIOPSY  03/20/2022   Procedure: BIOPSY;   Surgeon: Lanelle Bal, DO;  [facility]: [facility];  Service: Endoscopy;;  gastric lesion vs. gastric ulcer   BIOPSY  06/28/2022   Procedure: BIOPSY;  Surgeon: Lanelle Bal, DO;  [facility]: [facility];  Service: Endoscopy;;   BIOPSY  05/10/2023   Procedure: BIOPSY;  Surgeon: Lanelle Bal, DO;  [facility]: [facility];  Service: Endoscopy;;   BRAVO PH STUDY  12/04/2012   Procedure: BRAVO PH STUDY;  Surgeon: West Bali, MD;  Location: [facility];  Service: Endoscopy;  Laterality: N/A;   CHOLECYSTECTOMY N/A 01/03/2014   Procedure: LAPAROSCOPIC CHOLECYSTECTOMY WITH INTRAOPERATIVE CHOLANGIOGRAM;  Surgeon: Clovis Pu. Cornett, MD;  Location: [facility];  Service: General;  Laterality: N/A;   COLONOSCOPY  01/2004   DR [facility], few small tics   COLONOSCOPY WITH PROPOFOL N/A 05/10/2023   Procedure: COLONOSCOPY WITH PROPOFOL;  Surgeon: Lanelle Bal, DO;  [facility]: [facility];  Service: Endoscopy;  Laterality: N/A;  1245pm, asa 3   ESOPHAGOGASTRODUODENOSCOPY  02/2010   gastric ulcers   ESOPHAGOGASTRODUODENOSCOPY  12/04/2012   EXB:MWUXLKG gastritis (inflammation) was found in the gastric antrum; multiple biopsies The duodenal mucosa showed no abnormalities in the bulb and second portion of the duodenum   ESOPHAGOGASTRODUODENOSCOPY (EGD) WITH PROPOFOL N/A 03/20/2022   Procedure: ESOPHAGOGASTRODUODENOSCOPY (EGD) WITH PROPOFOL;  Surgeon: Lanelle Bal, DO;  [facility];  Service: Endoscopy;  Laterality: N/A;   ESOPHAGOGASTRODUODENOSCOPY (EGD) WITH PROPOFOL N/A 06/28/2022   Procedure: ESOPHAGOGASTRODUODENOSCOPY (EGD) WITH PROPOFOL;  Surgeon: Lanelle Bal, DO;  [facility];  Service: Endoscopy;  Laterality: N/A;  800   ESOPHAGOGASTRODUODENOSCOPY (EGD) WITH PROPOFOL N/A 05/10/2023   Procedure: ESOPHAGOGASTRODUODENOSCOPY (EGD) WITH PROPOFOL;  Surgeon: Lanelle Bal, DO;  [facility];  Service: Endoscopy;  Laterality: N/A;    JOINT REPLACEMENT Right 10/22/2019   KYPHOPLASTY N/A 02/14/2019   Procedure: KYPHOPLASTY T12 and L1;  Surgeon: [name], MD;  Location: [facility];  Service: Orthopedics;  Laterality: N/A;  120 mins   LIPOMA EXCISION  08/02/2011   left shoulder   NOSE SURGERY     PARTIAL HYSTERECTOMY     vaginal at age 29 years of age   POLYPECTOMY  05/10/2023   Procedure: POLYPECTOMY;  Surgeon: Lanelle Bal, DO;  [facility]: [facility];  Service: Endoscopy;;   TOE DEBRIDEMENT Left 1962   lt great toe   TOTAL HIP ARTHROPLASTY Right 11/18/2021   Procedure: TOTAL HIP ARTHROPLASTY ANTERIOR APPROACH;  Surgeon: Ollen Gross, MD;  Location: [facility];  Service: Orthopedics;  Laterality: Right;   TOTAL KNEE ARTHROPLASTY Right 10/22/2019   Procedure: TOTAL KNEE ARTHROPLASTY;  Surgeon: Ollen Gross, MD;  Location: [facility];  Service: Orthopedics;  Laterality: Right;    TOTAL KNEE ARTHROPLASTY Left 03/03/2020   Procedure: TOTAL KNEE ARTHROPLASTY;  Surgeon: Ollen Gross, MD;  Location: [facility];  Service: Orthopedics;  Laterality: Left;    TRANSFORAMINAL LUMBAR INTERBODY FUSION (TLIF) WITH PEDICLE SCREW FIXATION 1 LEVEL N/A 01/28/2021   Procedure: TRANSFORAMINAL LUMBAR INTERBODY FUSION (TLIF) LUMBAR FIVE-SACRAL ONE;  Surgeon: [name], MD;  Location: [facility];  Service: Orthopedics;  Laterality: N/A;  4 hrs   VIDEO ASSISTED THORACOSCOPY (VATS)/EMPYEMA Left 10/25/2016   Procedure: VIDEO ASSISTED THORACOSCOPY (VATS), BRONCH,DRAINAGE OF PLEURAL EFFUSION,PERICARDIAL WINDOW WITH DRAINAGE OF PERICARDIAL FLUID, TEE;  Surgeon: Loreli Slot, MD;  Location: [facility];  Service: Thoracic;  Laterality: Left;   VIDEO BRONCHOSCOPY N/A 10/25/2016   Procedure: VIDEO BRONCHOSCOPY;  Surgeon: Loreli Slot, MD;  Location: [facility];  Service: Thoracic;  Laterality: N/A;    Social History   Socioeconomic History   Marital status: Married    Spouse name: Greggory Stallion   Number of children: 2   Years of education: 12th  grade   Highest education level: Not on file  Occupational History   Occupation: retired    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   2 granddaughters.   Retired HR.    Social Determinants of Health   Financial Resource Strain: Low Risk  (05/23/2023)   Overall Financial Resource Strain (CARDIA)    Difficulty of Paying Living Expenses: Not hard at all  Food Insecurity: No Food Insecurity (05/23/2023)   Hunger Vital Sign    Worried About Running Out of Food in the Last Year: Never true    Ran Out  of Food in the Last Year: Never true  Transportation Needs: No Transportation Needs (05/23/2023)   PRAPARE - Administrator, Civil Service (Medical): No    Lack of Transportation (Non-Medical): No  Physical Activity: Insufficiently Active (05/23/2023)   Exercise Vital Sign    Days of Exercise per Week: 3 days    Minutes of Exercise per Session: 30 min  Stress: No Stress Concern Present (05/23/2023)   [employer] of Occupational Health - Occupational Stress Questionnaire    Feeling of Stress : Not at all  Social Connections: Moderately Integrated (05/23/2023)   Social Connection and Isolation Panel [NHANES]    Frequency of Communication with Friends and Family: More than three times a week    Frequency of Social Gatherings with Friends and Family: More than three times a week    Attends Religious Services: More than 4 times per year    Active Member of [employer] or Organizations: No    Attends Banker Meetings: Never    Marital Status: Married  Catering manager  Violence: Not At Risk (05/23/2023)   Humiliation, Afraid, Rape, and Kick questionnaire    Fear of Current or Ex-Partner: No    Emotionally Abused: No    Physically Abused: No    Sexually Abused: No    Outpatient Encounter Medications as of 06/22/2023  Medication Sig   acetaminophen (TYLENOL) 500 MG tablet Take 1,000 mg by mouth every 8 (eight) hours as needed for moderate pain.   cetirizine (ZYRTEC) 10 MG tablet Take 10 mg by mouth daily.   citalopram (CELEXA) 20 MG tablet TAKE 1 TABLET BY MOUTH EVERY DAY   ibuprofen (ADVIL) 200 MG tablet Take 200 mg by mouth daily as needed for moderate pain.   lactose free nutrition (BOOST) LIQD Take 237 mLs by mouth daily.   levothyroxine (SYNTHROID) 88 MCG tablet TAKE 1 TABLET BY MOUTH EVERY DAY BEFORE BREAKFAST   Multiple Vitamins-Minerals (CENTRUM SILVER PO) Take 1 tablet by mouth daily in the afternoon.   nilotinib (TASIGNA) 150 MG capsule Take 2 capsules (300 mg total) by mouth every 12 (twelve) hours.   Probiotic Product (DIGESTIVE ADV PREBIOT+PROBIOT) CHEW Chew 2 each by mouth daily.   No facility-administered encounter medications on file as of 06/22/2023.    Allergies  Allergen Reactions   Nucynta [Tapentadol Hcl] Swelling   Codeine Nausea Only and Other (See Comments)    Headache   Sulfonamide Derivatives Nausea Only and Other (See Comments)    Headache   Nexium [Esomeprazole Magnesium] Nausea Only   Sulfa Antibiotics Nausea Only    Headache    Fluconazole Rash    Redness and blistering on left thigh    Review of Systems  Constitutional:  Positive for diaphoresis (chronic). Negative for activity change, appetite change, chills, fatigue, fever and unexpected weight change.  HENT: Negative.    Eyes: Negative.  Negative for photophobia and visual disturbance.  Respiratory:  Negative for cough, chest tightness and shortness of breath.   Cardiovascular:  Negative for chest pain, palpitations and leg swelling.  Gastrointestinal:   Negative for abdominal pain, blood in stool, constipation, diarrhea, nausea and vomiting.  Endocrine: Negative.   Genitourinary:  Negative for decreased urine volume, difficulty urinating, dysuria, frequency and urgency.  Musculoskeletal:  Positive for arthralgias, gait problem and myalgias. Negative for back pain, joint swelling, neck pain and neck stiffness.  Skin: Negative.   Allergic/Immunologic: Negative.   Neurological:  Negative for dizziness and headaches.  Hematological: Negative.  Psychiatric/Behavioral:  Negative for confusion, hallucinations, sleep disturbance and suicidal ideas.   All other systems reviewed and are negative.       Objective:  BP 137/68   Pulse 72   Temp 97.6 F (36.4 C)   Resp 18   Wt 216 lb 9.6 oz (98.2 kg)   SpO2 97%   BMI 36.04 kg/m    Wt Readings from Last 3 Encounters:  06/22/23 216 lb 9.6 oz (98.2 kg)  05/23/23 214 lb (97.1 kg)  05/17/23 214 lb (97.1 kg)    Physical Exam Vitals and nursing note reviewed.  Constitutional:      General: She is not in acute distress.    Appearance: Normal appearance. She is obese. She is not ill-appearing, toxic-appearing or diaphoretic.  HENT:     Head: Normocephalic and atraumatic.     Right Ear: Tympanic membrane, ear canal and external ear normal.     Left Ear: Tympanic membrane, ear canal and external ear normal.     Nose: Nose normal.     Mouth/Throat:     Mouth: Mucous membranes are moist.     Pharynx: Oropharynx is clear. No oropharyngeal exudate or posterior oropharyngeal erythema.  Eyes:     Extraocular Movements: Extraocular movements intact.     Conjunctiva/sclera: Conjunctivae normal.     Pupils: Pupils are equal, round, and reactive to light.  Neck:     Trachea: Trachea and phonation normal.  Cardiovascular:     Rate and Rhythm: Normal rate and regular rhythm.     Pulses: Normal pulses.     Heart sounds: Normal heart sounds.  Pulmonary:     Effort: Pulmonary effort is normal.      Breath sounds: Normal breath sounds.  Abdominal:     General: Bowel sounds are normal.     Palpations: Abdomen is soft.     Tenderness: There is no abdominal tenderness.  Musculoskeletal:     Cervical back: Normal range of motion and neck supple.     Lumbar back: Normal.     Left hip: Tenderness present. Decreased range of motion.     Left knee: Normal.     Right lower leg: No edema.     Left lower leg: No edema.  Skin:    General: Skin is warm and dry.     Capillary Refill: Capillary refill takes less than 2 seconds.  Neurological:     General: No focal deficit present.     Mental Status: She is alert and oriented to person, place, and time.  Psychiatric:        Mood and Affect: Mood normal.        Behavior: Behavior normal.        Thought Content: Thought content normal.        Judgment: Judgment normal.     Results for orders placed or performed in visit on 06/22/23  Bayer DCA Hb A1c Waived  Result Value Ref Range   HB A1C (BAYER DCA - WAIVED) 5.2 4.8 - 5.6 %       Pertinent labs & imaging results that were available during my care of the patient were reviewed by me and considered in my medical decision making.  Assessment & Plan:  Dosha was seen today for pre-op exam.  Diagnoses and all orders for this visit:  Preoperative examination PT/PTT not drawn by lab today so had to cancel, can obtain prior to surgery. Other labs pending. Moderate risk for moderate risk surgery  due to comorbid conditions. Has been evaluated by specialists for surgical clearance.  -     CMP14+EGFR -     CBC with Differential/Platelet -     Bayer DCA Hb A1c Waived      Continue all other maintenance medications.  Follow up plan: Return if symptoms worsen or fail to improve.   Continue healthy lifestyle choices, including diet (rich in fruits, vegetables, and lean proteins, and low in salt and simple carbohydrates) and exercise (at least 30 minutes of moderate physical activity  daily).  Educational handout given for health maintenance   The above assessment and management plan was discussed with the patient. The patient verbalized understanding of and has agreed to the management plan. Patient is aware to call the clinic if they develop any new symptoms or if symptoms persist or worsen. Patient is aware when to return to the clinic for a follow-up visit. Patient educated on when it is appropriate to go to the emergency department.   Kari Baars, FNP-C [facility] [phone]

## 2023-06-22 NOTE — Progress Notes (Addendum)
COVID Vaccine Completed: yes  Date of COVID positive in last 90 days:  PCP - Gilford Silvius, FNP Cardiologist - Randall An LOV 02/27/20  Chest x-ray -  EKG - 05/05/23 Epic Stress Test -  ECHO - 07/14/15 Epic Cardiac Cath -  Pacemaker/ICD device last checked: Spinal Cord Stimulator:  Bowel Prep -   Sleep Study -  CPAP -   Fasting Blood Sugar -  Checks Blood Sugar _____ times a day  Last dose of GLP1 agonist-  N/A GLP1 instructions:  N/A   Last dose of SGLT-2 inhibitors-  N/A SGLT-2 instructions: N/A   Blood Thinner Instructions:  Time Aspirin Instructions: Last Dose:  Activity level:  Can go up a flight of stairs and perform activities of daily living without stopping and without symptoms of chest pain or shortness of breath.  Able to exercise without symptoms  Unable to go up a flight of stairs without symptoms of     Anesthesia review:   Patient denies shortness of breath, fever, cough and chest pain at PAT appointment  Patient verbalized understanding of instructions that were given to them at the PAT appointment. Patient was also instructed that they will need to review over the PAT instructions again at home before surgery.

## 2023-06-22 NOTE — Patient Instructions (Signed)
SURGICAL WAITING ROOM VISITATION  Patients having surgery or a procedure may have no more than 2 support people in the waiting area - these visitors may rotate.    Children under the age of 44 must have an adult with them who is not the patient.  Due to an increase in RSV and influenza rates and associated hospitalizations, children ages 55 and under may not visit patients in [facility] hospitals.  If the patient needs to stay at the hospital during part of their recovery, the visitor guidelines for inpatient rooms apply. Pre-op nurse will coordinate an appropriate time for 1 support person to accompany patient in pre-op.  This support person may not rotate.    Please refer to the [facility] website for the visitor guidelines for Inpatients (after your surgery is over and you are in a regular room).    Your procedure is scheduled on: 06/29/23   Report to [facility] Main Entrance    Report to admitting at 7:15 AM   Call this number if you have problems the morning of surgery [phone]   Do not eat food :After Midnight.   After Midnight you may have the following liquids until 6:45 AM DAY OF SURGERY  Water Non-Citrus Juices (without pulp, NO RED-Apple, White grape, White cranberry) Black Coffee (NO MILK/CREAM OR CREAMERS, sugar ok)  Clear Tea (NO MILK/CREAM OR CREAMERS, sugar ok) regular and decaf                             Plain Jell-O (NO RED)                                           Fruit ices (not with fruit pulp, NO RED)                                     Popsicles (NO RED)                                                               Sports drinks like Gatorade (NO RED)                 The day of surgery:  Drink ONE (1) Pre-Surgery Clear Ensure at 6:45 AM the morning of surgery. Drink in one sitting. Do not sip.  This drink was given to you during your hospital  pre-op appointment visit. Nothing else to drink after completing the  Pre-Surgery Clear  Ensure.          If you have questions, please contact your surgeon's office.   FOLLOW BOWEL PREP AND ANY ADDITIONAL PRE OP INSTRUCTIONS YOU RECEIVED FROM YOUR SURGEON'S OFFICE!!!     Oral Hygiene is also important to reduce your risk of infection.                                    Remember - BRUSH YOUR TEETH THE MORNING OF SURGERY WITH YOUR REGULAR TOOTHPASTE  DENTURES WILL  BE REMOVED PRIOR TO SURGERY PLEASE DO NOT APPLY "Poly grip" OR ADHESIVES!!!   Take these medicines the morning of surgery with A SIP OF WATER: Tylenol, Zyrtec, Citalopram, Levothyroxine                              You may not have any metal on your body including hair pins, jewelry, and body piercing             Do not wear make-up, lotions, powders, perfumes, or deodorant  Do not wear nail polish including gel and S&S, artificial/acrylic nails, or any other type of covering on natural nails including finger and toenails. If you have artificial nails, gel coating, etc. that needs to be removed by a nail salon please have this removed prior to surgery or surgery may need to be canceled/ delayed if the surgeon/ anesthesia feels like they are unable to be safely monitored.   Do not shave  48 hours prior to surgery.    Do not bring valuables to the hospital. [facility] IS NOT             RESPONSIBLE   FOR VALUABLES.   Contacts, glasses, dentures or bridgework may not be worn into surgery.   Bring small overnight bag day of surgery.   DO NOT BRING YOUR HOME MEDICATIONS TO THE HOSPITAL. PHARMACY WILL DISPENSE MEDICATIONS LISTED ON YOUR MEDICATION LIST TO YOU DURING YOUR ADMISSION IN THE HOSPITAL!              Please read over the following fact sheets you were given: IF YOU HAVE QUESTIONS ABOUT YOUR PRE-OP INSTRUCTIONS PLEASE CALL [phone] [place]   If you received a COVID test during your pre-op visit  it is requested that you wear a mask when out in public, stay away from anyone that may not be feeling well  and notify your surgeon if you develop symptoms. If you test positive for Covid or have been in contact with anyone that has tested positive in the last 10 days please notify you surgeon.      Pre-operative 5 CHG Bath Instructions   You can play a key role in reducing the risk of infection after surgery. Your skin needs to be as free of germs as possible. You can reduce the number of germs on your skin by washing with CHG (chlorhexidine gluconate) soap before surgery. CHG is an antiseptic soap that kills germs and continues to kill germs even after washing.   DO NOT use if you have an allergy to chlorhexidine/CHG or antibacterial soaps. If your skin becomes reddened or irritated, stop using the CHG and notify one of our RNs at [phone].   Please shower with the CHG soap starting 4 days before surgery using the following schedule:     Please keep in mind the following:  DO NOT shave, including legs and underarms, starting the day of your first shower.   You may shave your face at any point before/day of surgery.  Place clean sheets on your bed the day you start using CHG soap. Use a clean washcloth (not used since being washed) for each shower. DO NOT sleep with pets once you start using the CHG.   CHG Shower Instructions:  If you choose to wash your hair and private area, wash first with your normal shampoo/soap.  After you use shampoo/soap, rinse your hair and body thoroughly to remove shampoo/soap residue.  Turn the water OFF and apply about 3 tablespoons (45 ml) of CHG soap to a CLEAN washcloth.  Apply CHG soap ONLY FROM YOUR NECK DOWN TO YOUR TOES (washing for 3-5 minutes)  DO NOT use CHG soap on face, private areas, open wounds, or sores.  Pay special attention to the area where your surgery is being performed.  If you are having back surgery, having someone wash your back for you may be helpful. Wait 2 minutes after CHG soap is applied, then you may rinse off the CHG soap.   Pat dry with a clean towel  Put on clean clothes/pajamas   If you choose to wear lotion, please use ONLY the CHG-compatible lotions on the back of this paper.     Additional instructions for the day of surgery: DO NOT APPLY any lotions, deodorants, cologne, or perfumes.   Put on clean/comfortable clothes.  Brush your teeth.  Ask your nurse before applying any prescription medications to the skin.      CHG Compatible Lotions   Aveeno Moisturizing lotion  Cetaphil Moisturizing Cream  Cetaphil Moisturizing Lotion  Clairol Herbal Essence Moisturizing Lotion, Dry Skin  Clairol Herbal Essence Moisturizing Lotion, Extra Dry Skin  Clairol Herbal Essence Moisturizing Lotion, Normal Skin  Curel Age Defying Therapeutic Moisturizing Lotion with Alpha Hydroxy  Curel Extreme Care Body Lotion  Curel Soothing Hands Moisturizing Hand Lotion  Curel Therapeutic Moisturizing Cream, Fragrance-Free  Curel Therapeutic Moisturizing Lotion, Fragrance-Free  Curel Therapeutic Moisturizing Lotion, Original Formula  Eucerin Daily Replenishing Lotion  Eucerin Dry Skin Therapy Plus Alpha Hydroxy Crme  Eucerin Dry Skin Therapy Plus Alpha Hydroxy Lotion  Eucerin Original Crme  Eucerin Original Lotion  Eucerin Plus Crme Eucerin Plus Lotion  Eucerin TriLipid Replenishing Lotion  Keri Anti-Bacterial Hand Lotion  Keri Deep Conditioning Original Lotion Dry Skin Formula Softly Scented  Keri Deep Conditioning Original Lotion, Fragrance Free Sensitive Skin Formula  Keri Lotion Fast Absorbing Fragrance Free Sensitive Skin Formula  Keri Lotion Fast Absorbing Softly Scented Dry Skin Formula  Keri Original Lotion  Keri Skin Renewal Lotion Keri Silky Smooth Lotion  Keri Silky Smooth Sensitive Skin Lotion  Nivea Body Creamy Conditioning Oil  Nivea Body Extra Enriched Teacher, adult education Moisturizing Lotion Nivea Crme  Nivea Skin Firming Lotion  NutraDerm 30 Skin Lotion   NutraDerm Skin Lotion  NutraDerm Therapeutic Skin Cream  NutraDerm Therapeutic Skin Lotion  ProShield Protective Hand Cream  Provon moisturizing lotion  WHAT IS A BLOOD TRANSFUSION? Blood Transfusion Information  A transfusion is the replacement of blood or some of its parts. Blood is made up of multiple cells which provide different functions. Red blood cells carry oxygen and are used for blood loss replacement. White blood cells fight against infection. Platelets control bleeding. Plasma helps clot blood. Other blood products are available for specialized needs, such as hemophilia or other clotting disorders. BEFORE THE TRANSFUSION  Who gives blood for transfusions?  Healthy volunteers who are fully evaluated to make sure their blood is safe. This is blood bank blood. Transfusion therapy is the safest it has ever been in the practice of medicine. Before blood is taken from a donor, a complete history is taken to make sure that person has no history of diseases nor engages in risky social behavior (examples are intravenous drug use or sexual activity with multiple partners). The donor's travel history is screened to minimize risk of transmitting infections, such as malaria. The donated blood is tested  for signs of infectious diseases, such as HIV and hepatitis. The blood is then tested to be sure it is compatible with you in order to minimize the chance of a transfusion reaction. If you or a relative donates blood, this is often done in anticipation of surgery and is not appropriate for emergency situations. It takes many days to process the donated blood. RISKS AND COMPLICATIONS Although transfusion therapy is very safe and saves many lives, the main dangers of transfusion include:  Getting an infectious disease. Developing a transfusion reaction. This is an allergic reaction to something in the blood you were given. Every precaution is taken to prevent this. The decision to have a blood  transfusion has been considered carefully by your caregiver before blood is given. Blood is not given unless the benefits outweigh the risks. AFTER THE TRANSFUSION Right after receiving a blood transfusion, you will usually feel much better and more energetic. This is especially true if your red blood cells have gotten low (anemic). The transfusion raises the level of the red blood cells which carry oxygen, and this usually causes an energy increase. The nurse administering the transfusion will monitor you carefully for complications. HOME CARE INSTRUCTIONS  No special instructions are needed after a transfusion. You may find your energy is better. Speak with your caregiver about any limitations on activity for underlying diseases you may have. SEEK MEDICAL CARE IF:  Your condition is not improving after your transfusion. You develop redness or irritation at the intravenous (IV) site. SEEK IMMEDIATE MEDICAL CARE IF:  Any of the following symptoms occur over the next 12 hours: Shaking chills. You have a temperature by mouth above 102 F (38.9 C), not controlled by medicine. Chest, back, or muscle pain. People around you feel you are not acting correctly or are confused. Shortness of breath or difficulty breathing. Dizziness and fainting. You get a rash or develop hives. You have a decrease in urine output. Your urine turns a dark color or changes to pink, red, or brown. Any of the following symptoms occur over the next 10 days: You have a temperature by mouth above 102 F (38.9 C), not controlled by medicine. Shortness of breath. Weakness after normal activity. The white part of the eye turns yellow (jaundice). You have a decrease in the amount of urine or are urinating less often. Your urine turns a dark color or changes to pink, red, or brown. Document Released: 11/26/2000 Document Revised: 02/21/2012 Document Reviewed: 07/15/2008 Exit[employer] Patient Information 2014 [place],  [place].  _______________________________________________________________________  Incentive Spirometer  An incentive spirometer is a tool that can help keep your lungs clear and active. This tool measures how well you are filling your lungs with each breath. Taking long deep breaths may help reverse or decrease the chance of developing breathing (pulmonary) problems (especially infection) following: A long period of time when you are unable to move or be active. BEFORE THE PROCEDURE  If the spirometer includes an indicator to show your best effort, your nurse or respiratory therapist will set it to a desired goal. If possible, sit up straight or lean slightly forward. Try not to slouch. Hold the incentive spirometer in an upright position. INSTRUCTIONS FOR USE  Sit on the edge of your bed if possible, or sit up as far as you can in bed or on a chair. Hold the incentive spirometer in an upright position. Breathe out normally. Place the mouthpiece in your mouth and seal your lips tightly around it. Breathe in  slowly and as deeply as possible, raising the piston or the ball toward the top of the column. Hold your breath for 3-5 seconds or for as long as possible. Allow the piston or ball to fall to the bottom of the column. Remove the mouthpiece from your mouth and breathe out normally. Rest for a few seconds and repeat Steps 1 through 7 at least 10 times every 1-2 hours when you are awake. Take your time and take a few normal breaths between deep breaths. The spirometer may include an indicator to show your best effort. Use the indicator as a goal to work toward during each repetition. After each set of 10 deep breaths, practice coughing to be sure your lungs are clear. If you have an incision (the cut made at the time of surgery), support your incision when coughing by placing a pillow or rolled up towels firmly against it. Once you are able to get out of bed, walk around indoors and cough well.  You may stop using the incentive spirometer when instructed by your caregiver.  RISKS AND COMPLICATIONS Take your time so you do not get dizzy or light-headed. If you are in pain, you may need to take or ask for pain medication before doing incentive spirometry. It is harder to take a deep breath if you are having pain. AFTER USE Rest and breathe slowly and easily. It can be helpful to keep track of a log of your progress. Your caregiver can provide you with a simple table to help with this. If you are using the spirometer at home, follow these instructions: SEEK MEDICAL CARE IF:  You are having difficultly using the spirometer. You have trouble using the spirometer as often as instructed. Your pain medication is not giving enough relief while using the spirometer. You develop fever of 100.5 F (38.1 C) or higher. SEEK IMMEDIATE MEDICAL CARE IF:  You cough up bloody sputum that had not been present before. You develop fever of 102 F (38.9 C) or greater. You develop worsening pain at or near the incision site. MAKE SURE YOU:  Understand these instructions. Will watch your condition. Will get help right away if you are not doing well or get worse. Document Released: 04/11/2007 Document Revised: 02/21/2012 Document Reviewed: 06/12/2007 [facility] Patient Information 2014 [place], [place].   ________________________________________________________________________

## 2023-06-23 ENCOUNTER — Encounter (HOSPITAL_COMMUNITY)
Admission: RE | Admit: 2023-06-23 | Discharge: 2023-06-23 | Disposition: A | Discharge status: Home / Self Care | Payer: PPO | Source: Ambulatory Visit | Attending: Orthopedic Surgery | Admitting: Orthopedic Surgery

## 2023-06-23 ENCOUNTER — Encounter (HOSPITAL_COMMUNITY): Payer: Self-pay

## 2023-06-23 ENCOUNTER — Other Ambulatory Visit: Payer: Self-pay

## 2023-06-23 VITALS — BP 127/68 | HR 76 | Temp 98.2°F | Resp 16 | Ht 65.0 in | Wt 215.0 lb

## 2023-06-23 DIAGNOSIS — Z01818 Encounter for other preprocedural examination: Secondary | ICD-10-CM

## 2023-06-23 DIAGNOSIS — I700 Atherosclerosis of aorta: Secondary | ICD-10-CM | POA: Insufficient documentation

## 2023-06-23 DIAGNOSIS — Z01812 Encounter for preprocedural laboratory examination: Secondary | ICD-10-CM | POA: Insufficient documentation

## 2023-06-23 LAB — CMP14+EGFR
ALT: 18 IU/L (ref 0–32)
AST: 29 IU/L (ref 0–40)
Albumin: 4.5 g/dL (ref 3.8–4.8)
Alkaline Phosphatase: 214 IU/L — ABNORMAL HIGH (ref 44–121)
BUN/Creatinine Ratio: 23 (ref 12–28)
BUN: 24 mg/dL (ref 8–27)
Bilirubin Total: 0.6 mg/dL (ref 0.0–1.2)
CO2: 23 mmol/L (ref 20–29)
Calcium: 9.7 mg/dL (ref 8.7–10.3)
Chloride: 106 mmol/L (ref 96–106)
Creatinine, Ser: 1.03 mg/dL — ABNORMAL HIGH (ref 0.57–1.00)
Globulin, Total: 2.8 g/dL (ref 1.5–4.5)
Glucose: 96 mg/dL (ref 70–99)
Potassium: 5.4 mmol/L — ABNORMAL HIGH (ref 3.5–5.2)
Sodium: 143 mmol/L (ref 134–144)
Total Protein: 7.3 g/dL (ref 6.0–8.5)
eGFR: 56 mL/min/{1.73_m2} — ABNORMAL LOW (ref >59)

## 2023-06-23 LAB — CBC WITH DIFFERENTIAL/PLATELET
Basophils Absolute: 0 10*3/uL (ref 0.0–0.2)
Basos: 0 %
EOS (ABSOLUTE): 0.2 10*3/uL (ref 0.0–0.4)
Eos: 2 %
Hematocrit: 38.7 % (ref 34.0–46.6)
Hemoglobin: 12.9 g/dL (ref 11.1–15.9)
Immature Grans (Abs): 0 10*3/uL (ref 0.0–0.1)
Immature Granulocytes: 0 %
Lymphocytes Absolute: 1.3 10*3/uL (ref 0.7–3.1)
Lymphs: 14 %
MCH: 30.5 pg (ref 26.6–33.0)
MCHC: 33.3 g/dL (ref 31.5–35.7)
MCV: 92 fL (ref 79–97)
Monocytes Absolute: 0.6 10*3/uL (ref 0.1–0.9)
Monocytes: 6 %
Neutrophils Absolute: 7.4 10*3/uL — ABNORMAL HIGH (ref 1.4–7.0)
Neutrophils: 78 %
Platelets: 255 10*3/uL (ref 150–450)
RBC: 4.23 x10E6/uL (ref 3.77–5.28)
RDW: 11.8 % (ref 11.7–15.4)
WBC: 9.6 10*3/uL (ref 3.4–10.8)

## 2023-06-23 LAB — SURGICAL PCR SCREEN
MRSA, PCR: NEGATIVE
Staphylococcus aureus: NEGATIVE

## 2023-06-23 NOTE — Addendum Note (Signed)
Addended by: Lorelee Cover C on: 06/23/2023 02:16 PM   Modules accepted: Orders

## 2023-06-29 ENCOUNTER — Encounter: Payer: Self-pay | Admitting: *Deleted

## 2023-06-29 ENCOUNTER — Encounter (HOSPITAL_COMMUNITY): Payer: Self-pay | Admitting: Orthopedic Surgery

## 2023-06-29 ENCOUNTER — Ambulatory Visit (HOSPITAL_COMMUNITY): Payer: PPO | Admitting: Physician Assistant

## 2023-06-29 ENCOUNTER — Ambulatory Visit: Payer: PPO | Admitting: Gastroenterology

## 2023-06-29 ENCOUNTER — Encounter (HOSPITAL_COMMUNITY)
Admission: RE | Disposition: A | Discharge status: Home / Self Care | Payer: Self-pay | Source: Home / Self Care | Attending: Orthopedic Surgery

## 2023-06-29 ENCOUNTER — Ambulatory Visit (HOSPITAL_BASED_OUTPATIENT_CLINIC_OR_DEPARTMENT_OTHER): Payer: PPO | Admitting: Anesthesiology

## 2023-06-29 ENCOUNTER — Ambulatory Visit (HOSPITAL_COMMUNITY): Payer: PPO

## 2023-06-29 ENCOUNTER — Other Ambulatory Visit: Payer: Self-pay

## 2023-06-29 ENCOUNTER — Observation Stay (HOSPITAL_COMMUNITY): Payer: PPO

## 2023-06-29 ENCOUNTER — Observation Stay (HOSPITAL_COMMUNITY)
Admission: RE | Admit: 2023-06-29 | Discharge: 2023-06-30 | Disposition: A | Discharge status: Home / Self Care | Payer: PPO | Attending: Orthopedic Surgery | Admitting: Orthopedic Surgery

## 2023-06-29 DIAGNOSIS — E782 Mixed hyperlipidemia: Secondary | ICD-10-CM

## 2023-06-29 DIAGNOSIS — Z79899 Other long term (current) drug therapy: Secondary | ICD-10-CM | POA: Insufficient documentation

## 2023-06-29 DIAGNOSIS — E039 Hypothyroidism, unspecified: Secondary | ICD-10-CM

## 2023-06-29 DIAGNOSIS — I700 Atherosclerosis of aorta: Secondary | ICD-10-CM | POA: Diagnosis not present

## 2023-06-29 DIAGNOSIS — Z96641 Presence of right artificial hip joint: Secondary | ICD-10-CM | POA: Diagnosis not present

## 2023-06-29 DIAGNOSIS — Z96653 Presence of artificial knee joint, bilateral: Secondary | ICD-10-CM | POA: Diagnosis not present

## 2023-06-29 DIAGNOSIS — M1612 Unilateral primary osteoarthritis, left hip: Principal | ICD-10-CM | POA: Insufficient documentation

## 2023-06-29 DIAGNOSIS — Z96642 Presence of left artificial hip joint: Secondary | ICD-10-CM | POA: Diagnosis not present

## 2023-06-29 DIAGNOSIS — M169 Osteoarthritis of hip, unspecified: Secondary | ICD-10-CM | POA: Diagnosis present

## 2023-06-29 DIAGNOSIS — D509 Iron deficiency anemia, unspecified: Secondary | ICD-10-CM | POA: Diagnosis not present

## 2023-06-29 DIAGNOSIS — Z471 Aftercare following joint replacement surgery: Secondary | ICD-10-CM | POA: Diagnosis not present

## 2023-06-29 HISTORY — PX: TOTAL HIP ARTHROPLASTY: SHX124

## 2023-06-29 LAB — TYPE AND SCREEN
ABO/RH(D): A POS
Antibody Screen: NEGATIVE

## 2023-06-29 SURGERY — ARTHROPLASTY, HIP, TOTAL, ANTERIOR APPROACH
Anesthesia: Spinal | Site: Hip | Laterality: Left

## 2023-06-29 MED ORDER — FENTANYL CITRATE PF 50 MCG/ML IJ SOSY
25.0000 ug | PREFILLED_SYRINGE | INTRAMUSCULAR | Status: DC | PRN
  Administered 2023-06-29 (×2): 50 ug via INTRAVENOUS

## 2023-06-29 MED ORDER — MENTHOL 3 MG MT LOZG: 1.0000 | LOZENGE | OROMUCOSAL | Status: DC | PRN

## 2023-06-29 MED ORDER — CHLORHEXIDINE GLUCONATE 0.12 % MT SOLN
15.0000 mL | Freq: Once | OROMUCOSAL | Status: AC
  Administered 2023-06-29: 15 mL via OROMUCOSAL

## 2023-06-29 MED ORDER — LIDOCAINE HCL (PF) 2 % IJ SOLN
INTRAMUSCULAR | Status: AC
  Filled 2023-06-29: qty 5

## 2023-06-29 MED ORDER — PHENOL 1.4 % MT LIQD: 1.0000 | OROMUCOSAL | Status: DC | PRN

## 2023-06-29 MED ORDER — LACTATED RINGERS IV SOLN: INTRAVENOUS | Status: DC

## 2023-06-29 MED ORDER — ONDANSETRON HCL 4 MG/2ML IJ SOLN
4.0000 mg | Freq: Four times a day (QID) | INTRAMUSCULAR | Status: DC | PRN
  Administered 2023-06-30: 4 mg via INTRAVENOUS
  Filled 2023-06-29: qty 2

## 2023-06-29 MED ORDER — ONDANSETRON HCL 4 MG/2ML IJ SOLN
INTRAMUSCULAR | Status: AC
  Filled 2023-06-29: qty 2

## 2023-06-29 MED ORDER — HYDROMORPHONE HCL 1 MG/ML IJ SOLN
INTRAMUSCULAR | Status: AC
  Filled 2023-06-29: qty 1

## 2023-06-29 MED ORDER — HYDROCODONE-ACETAMINOPHEN 5-325 MG PO TABS
1.0000 | ORAL_TABLET | ORAL | Status: DC | PRN
  Administered 2023-06-29: 2 via ORAL
  Filled 2023-06-29: qty 2

## 2023-06-29 MED ORDER — ORAL CARE MOUTH RINSE: 15.0000 mL | Freq: Once | OROMUCOSAL | Status: AC

## 2023-06-29 MED ORDER — CEFAZOLIN SODIUM-DEXTROSE 2-4 GM/100ML-% IV SOLN
2.0000 g | INTRAVENOUS | Status: AC
  Administered 2023-06-29: 2 g via INTRAVENOUS
  Filled 2023-06-29: qty 100

## 2023-06-29 MED ORDER — KETOROLAC TROMETHAMINE 30 MG/ML IJ SOLN
INTRAMUSCULAR | Status: AC
  Filled 2023-06-29: qty 1

## 2023-06-29 MED ORDER — METHOCARBAMOL 500 MG IVPB - SIMPLE MED
500.0000 mg | Freq: Four times a day (QID) | INTRAVENOUS | Status: DC | PRN
  Administered 2023-06-29: 500 mg via INTRAVENOUS

## 2023-06-29 MED ORDER — WATER FOR IRRIGATION, STERILE IR SOLN
Status: DC | PRN
  Administered 2023-06-29: 2000 mL

## 2023-06-29 MED ORDER — PROPOFOL 10 MG/ML IV BOLUS
INTRAVENOUS | Status: DC | PRN
  Administered 2023-06-29: 20 mg via INTRAVENOUS
  Administered 2023-06-29: 10 mg via INTRAVENOUS

## 2023-06-29 MED ORDER — PROPOFOL 500 MG/50ML IV EMUL
INTRAVENOUS | Status: DC | PRN
  Administered 2023-06-29: 50 ug/kg/min via INTRAVENOUS

## 2023-06-29 MED ORDER — DEXAMETHASONE SODIUM PHOSPHATE 10 MG/ML IJ SOLN
INTRAMUSCULAR | Status: AC
  Filled 2023-06-29: qty 1

## 2023-06-29 MED ORDER — CITALOPRAM HYDROBROMIDE 20 MG PO TABS
20.0000 mg | ORAL_TABLET | Freq: Every day | ORAL | Status: DC
  Administered 2023-06-30: 20 mg via ORAL
  Filled 2023-06-29: qty 1

## 2023-06-29 MED ORDER — BUPIVACAINE-EPINEPHRINE (PF) 0.25% -1:200000 IJ SOLN
INTRAMUSCULAR | Status: DC | PRN
  Administered 2023-06-29: 30 mL

## 2023-06-29 MED ORDER — 0.9 % SODIUM CHLORIDE (POUR BTL) OPTIME
TOPICAL | Status: DC | PRN
  Administered 2023-06-29: 1000 mL

## 2023-06-29 MED ORDER — DEXAMETHASONE SODIUM PHOSPHATE 10 MG/ML IJ SOLN
10.0000 mg | Freq: Once | INTRAMUSCULAR | Status: AC
  Administered 2023-06-30: 10 mg via INTRAVENOUS
  Filled 2023-06-29: qty 1

## 2023-06-29 MED ORDER — FENTANYL CITRATE (PF) 100 MCG/2ML IJ SOLN
INTRAMUSCULAR | Status: AC
  Filled 2023-06-29: qty 2

## 2023-06-29 MED ORDER — PHENYLEPHRINE 80 MCG/ML (10ML) SYRINGE FOR IV PUSH (FOR BLOOD PRESSURE SUPPORT)
PREFILLED_SYRINGE | INTRAVENOUS | Status: DC | PRN
  Administered 2023-06-29: 80 ug via INTRAVENOUS
  Administered 2023-06-29: 160 ug via INTRAVENOUS
  Administered 2023-06-29: 80 ug via INTRAVENOUS

## 2023-06-29 MED ORDER — METOCLOPRAMIDE HCL 5 MG PO TABS: 5.0000 mg | ORAL_TABLET | Freq: Three times a day (TID) | ORAL | Status: DC | PRN

## 2023-06-29 MED ORDER — DEXAMETHASONE SODIUM PHOSPHATE 10 MG/ML IJ SOLN
INTRAMUSCULAR | Status: DC | PRN
  Administered 2023-06-29: 10 mg via INTRAVENOUS

## 2023-06-29 MED ORDER — MORPHINE SULFATE (PF) 2 MG/ML IV SOLN
0.5000 mg | INTRAVENOUS | Status: DC | PRN
  Administered 2023-06-29 (×2): 1 mg via INTRAVENOUS
  Filled 2023-06-29 (×2): qty 1

## 2023-06-29 MED ORDER — ONDANSETRON HCL 4 MG/2ML IJ SOLN
INTRAMUSCULAR | Status: DC | PRN
  Administered 2023-06-29: 4 mg via INTRAVENOUS

## 2023-06-29 MED ORDER — SODIUM CHLORIDE 0.9 % IV SOLN: INTRAVENOUS | Status: DC

## 2023-06-29 MED ORDER — ACETAMINOPHEN 10 MG/ML IV SOLN
1000.0000 mg | Freq: Four times a day (QID) | INTRAVENOUS | Status: DC
  Administered 2023-06-29: 1000 mg via INTRAVENOUS
  Filled 2023-06-29: qty 100

## 2023-06-29 MED ORDER — ONDANSETRON HCL 4 MG/2ML IJ SOLN
4.0000 mg | Freq: Once | INTRAMUSCULAR | Status: AC | PRN
  Administered 2023-06-29: 4 mg via INTRAVENOUS

## 2023-06-29 MED ORDER — METOCLOPRAMIDE HCL 5 MG/ML IJ SOLN
5.0000 mg | Freq: Three times a day (TID) | INTRAMUSCULAR | Status: DC | PRN
  Administered 2023-06-30: 10 mg via INTRAVENOUS
  Filled 2023-06-29: qty 2

## 2023-06-29 MED ORDER — CEFAZOLIN SODIUM-DEXTROSE 2-4 GM/100ML-% IV SOLN
2.0000 g | Freq: Four times a day (QID) | INTRAVENOUS | Status: AC
  Administered 2023-06-29 (×2): 2 g via INTRAVENOUS
  Filled 2023-06-29 (×2): qty 100

## 2023-06-29 MED ORDER — BUPIVACAINE-EPINEPHRINE 0.25% -1:200000 IJ SOLN
INTRAMUSCULAR | Status: AC
  Filled 2023-06-29: qty 1

## 2023-06-29 MED ORDER — POVIDONE-IODINE 10 % EX SWAB: 2.0000 | Freq: Once | CUTANEOUS | Status: DC

## 2023-06-29 MED ORDER — NILOTINIB HCL 150 MG PO CAPS: 300.0000 mg | ORAL_CAPSULE | Freq: Two times a day (BID) | ORAL | Status: DC

## 2023-06-29 MED ORDER — MIDAZOLAM HCL 5 MG/5ML IJ SOLN
INTRAMUSCULAR | Status: DC | PRN
  Administered 2023-06-29: 2 mg via INTRAVENOUS

## 2023-06-29 MED ORDER — BISACODYL 10 MG RE SUPP: 10.0000 mg | Freq: Every day | RECTAL | Status: DC | PRN

## 2023-06-29 MED ORDER — FENTANYL CITRATE PF 50 MCG/ML IJ SOSY
PREFILLED_SYRINGE | INTRAMUSCULAR | Status: AC
  Administered 2023-06-29: 50 ug via INTRAVENOUS
  Filled 2023-06-29: qty 3

## 2023-06-29 MED ORDER — DOCUSATE SODIUM 100 MG PO CAPS
100.0000 mg | ORAL_CAPSULE | Freq: Two times a day (BID) | ORAL | Status: DC
  Administered 2023-06-29 – 2023-06-30 (×2): 100 mg via ORAL
  Filled 2023-06-29 (×2): qty 1

## 2023-06-29 MED ORDER — HYDROMORPHONE HCL 1 MG/ML IJ SOLN
0.5000 mg | Freq: Once | INTRAMUSCULAR | Status: AC
  Administered 2023-06-29: 0.5 mg via INTRAVENOUS

## 2023-06-29 MED ORDER — METHOCARBAMOL 500 MG IVPB - SIMPLE MED
INTRAVENOUS | Status: AC
  Filled 2023-06-29: qty 55

## 2023-06-29 MED ORDER — LEVOTHYROXINE SODIUM 88 MCG PO TABS
88.0000 ug | ORAL_TABLET | Freq: Every day | ORAL | Status: DC
  Administered 2023-06-30: 88 ug via ORAL
  Filled 2023-06-29: qty 1

## 2023-06-29 MED ORDER — PHENYLEPHRINE 80 MCG/ML (10ML) SYRINGE FOR IV PUSH (FOR BLOOD PRESSURE SUPPORT)
PREFILLED_SYRINGE | INTRAVENOUS | Status: AC
  Filled 2023-06-29: qty 10

## 2023-06-29 MED ORDER — MIDAZOLAM HCL 2 MG/2ML IJ SOLN
INTRAMUSCULAR | Status: AC
  Filled 2023-06-29: qty 2

## 2023-06-29 MED ORDER — ACETAMINOPHEN 325 MG PO TABS: 325.0000 mg | ORAL_TABLET | Freq: Four times a day (QID) | ORAL | Status: DC | PRN

## 2023-06-29 MED ORDER — KETOROLAC TROMETHAMINE 30 MG/ML IJ SOLN
30.0000 mg | Freq: Once | INTRAMUSCULAR | Status: AC
  Administered 2023-06-29: 30 mg via INTRAVENOUS

## 2023-06-29 MED ORDER — FENTANYL CITRATE (PF) 100 MCG/2ML IJ SOLN
INTRAMUSCULAR | Status: DC | PRN
  Administered 2023-06-29: 50 ug via INTRAVENOUS

## 2023-06-29 MED ORDER — BUPIVACAINE IN DEXTROSE 0.75-8.25 % IT SOLN
INTRATHECAL | Status: DC | PRN
  Administered 2023-06-29: 1.6 mL via INTRATHECAL

## 2023-06-29 MED ORDER — MAGNESIUM CITRATE PO SOLN: 1.0000 | Freq: Once | ORAL | Status: DC | PRN

## 2023-06-29 MED ORDER — PROPOFOL 1000 MG/100ML IV EMUL
INTRAVENOUS | Status: AC
  Filled 2023-06-29: qty 100

## 2023-06-29 MED ORDER — TRANEXAMIC ACID-NACL 1000-0.7 MG/100ML-% IV SOLN
1000.0000 mg | INTRAVENOUS | Status: AC
  Administered 2023-06-29: 1000 mg via INTRAVENOUS
  Filled 2023-06-29: qty 100

## 2023-06-29 MED ORDER — AMISULPRIDE (ANTIEMETIC) 5 MG/2ML IV SOLN
INTRAVENOUS | Status: AC
  Filled 2023-06-29: qty 4

## 2023-06-29 MED ORDER — HYDROCODONE-ACETAMINOPHEN 7.5-325 MG PO TABS
1.0000 | ORAL_TABLET | ORAL | Status: DC | PRN
  Administered 2023-06-30 (×2): 2 via ORAL
  Filled 2023-06-29 (×2): qty 2

## 2023-06-29 MED ORDER — METHOCARBAMOL 500 MG PO TABS
500.0000 mg | ORAL_TABLET | Freq: Four times a day (QID) | ORAL | Status: DC | PRN
  Administered 2023-06-29: 500 mg via ORAL
  Filled 2023-06-29: qty 1

## 2023-06-29 MED ORDER — DEXAMETHASONE SODIUM PHOSPHATE 10 MG/ML IJ SOLN: 8.0000 mg | Freq: Once | INTRAMUSCULAR | Status: DC

## 2023-06-29 MED ORDER — RIVAROXABAN 10 MG PO TABS
10.0000 mg | ORAL_TABLET | Freq: Every day | ORAL | Status: DC
  Administered 2023-06-30: 10 mg via ORAL
  Filled 2023-06-29: qty 1

## 2023-06-29 MED ORDER — ONDANSETRON HCL 4 MG PO TABS: 4.0000 mg | ORAL_TABLET | Freq: Four times a day (QID) | ORAL | Status: DC | PRN

## 2023-06-29 MED ORDER — PROPOFOL 10 MG/ML IV BOLUS
INTRAVENOUS | Status: AC
  Filled 2023-06-29: qty 20

## 2023-06-29 MED ORDER — AMISULPRIDE (ANTIEMETIC) 5 MG/2ML IV SOLN
10.0000 mg | Freq: Once | INTRAVENOUS | Status: AC
  Administered 2023-06-29: 10 mg via INTRAVENOUS

## 2023-06-29 MED ORDER — POLYETHYLENE GLYCOL 3350 17 G PO PACK: 17.0000 g | PACK | Freq: Every day | ORAL | Status: DC | PRN

## 2023-06-29 SURGICAL SUPPLY — 42 items
ADH SKN CLS APL DERMABOND .7 (GAUZE/BANDAGES/DRESSINGS) ×1
BAG COUNTER SPONGE SURGICOUNT (BAG) IMPLANT
BAG SPEC THK2 15X12 ZIP CLS (MISCELLANEOUS)
BAG SPNG CNTER NS LX DISP (BAG)
BAG ZIPLOCK 12X15 (MISCELLANEOUS) IMPLANT
BLADE SAG 18X100X1.27 (BLADE) ×1 IMPLANT
COVER PERINEAL POST (MISCELLANEOUS) ×1 IMPLANT
COVER SURGICAL LIGHT HANDLE (MISCELLANEOUS) ×1 IMPLANT
CUP ACET PINNACLE SECTR 50MM (Hips) IMPLANT
DERMABOND ADVANCED .7 DNX12 (GAUZE/BANDAGES/DRESSINGS) ×1 IMPLANT
DRAPE FOOT SWITCH (DRAPES) ×1 IMPLANT
DRAPE STERI IOBAN 125X83 (DRAPES) ×1 IMPLANT
DRAPE U-SHAPE 47X51 STRL (DRAPES) ×2 IMPLANT
DRSG AQUACEL AG ADV 3.5X10 (GAUZE/BANDAGES/DRESSINGS) ×1 IMPLANT
DURAPREP 26ML APPLICATOR (WOUND CARE) ×1 IMPLANT
ELECT REM PT RETURN 15FT ADLT (MISCELLANEOUS) ×1 IMPLANT
GLOVE BIO SURGEON STRL SZ 6.5 (GLOVE) IMPLANT
GLOVE BIO SURGEON STRL SZ8 (GLOVE) ×1 IMPLANT
GLOVE BIOGEL PI IND STRL 6.5 (GLOVE) IMPLANT
GLOVE BIOGEL PI IND STRL 7.0 (GLOVE) IMPLANT
GLOVE BIOGEL PI IND STRL 8 (GLOVE) ×1 IMPLANT
GOWN STRL REUS W/ TWL LRG LVL3 (GOWN DISPOSABLE) ×1 IMPLANT
GOWN STRL REUS W/TWL LRG LVL3 (GOWN DISPOSABLE) ×1
HEAD FEM STD 32X+1 STRL (Hips) IMPLANT
HOLDER FOLEY CATH W/STRAP (MISCELLANEOUS) ×1 IMPLANT
KIT TURNOVER KIT A (KITS) IMPLANT
LINER MARATHON 32 50 (Hips) IMPLANT
MANIFOLD NEPTUNE II (INSTRUMENTS) ×1 IMPLANT
PACK ANTERIOR HIP CUSTOM (KITS) ×1 IMPLANT
PENCIL SMOKE EVACUATOR COATED (MISCELLANEOUS) ×1 IMPLANT
PINNACLE SECTOR CUP 50MM (Hips) ×1 IMPLANT
SPIKE FLUID TRANSFER (MISCELLANEOUS) ×1 IMPLANT
STEM FEMORAL SZ 5MM STD ACTIS (Stem) IMPLANT
SUT ETHIBOND NAB CT1 #1 30IN (SUTURE) ×1 IMPLANT
SUT MNCRL AB 4-0 PS2 18 (SUTURE) ×1 IMPLANT
SUT STRATAFIX 0 PDS 27 VIOLET (SUTURE) ×1
SUT VIC AB 2-0 CT1 27 (SUTURE) ×2
SUT VIC AB 2-0 CT1 TAPERPNT 27 (SUTURE) ×2 IMPLANT
SUTURE STRATFX 0 PDS 27 VIOLET (SUTURE) ×1 IMPLANT
TRAY FOLEY MTR SLVR 16FR STAT (SET/KITS/TRAYS/PACK) ×1 IMPLANT
TRAY FOLEY W/BAG SLVR 14FR LF (SET/KITS/TRAYS/PACK) IMPLANT
TUBE SUCTION HIGH CAP CLEAR NV (SUCTIONS) ×1 IMPLANT

## 2023-06-29 NOTE — Anesthesia Preprocedure Evaluation (Addendum)
Anesthesia Evaluation  Patient identified by MRN, date of birth, ID band Patient awake    Reviewed: Allergy & Precautions, NPO status , Patient's Chart, lab work & pertinent test results  History of Anesthesia Complications (+) PONV and history of anesthetic complications  Airway Mallampati: III  TM Distance: >3 FB Neck ROM: Full    Dental  (+) Teeth Intact, Dental Advisory Given,    Pulmonary neg pulmonary ROS   Pulmonary exam normal breath sounds clear to auscultation       Cardiovascular negative cardio ROS Normal cardiovascular exam Rhythm:Regular Rate:Normal     Neuro/Psych  PSYCHIATRIC DISORDERS Anxiety Depression    negative neurological ROS     GI/Hepatic Neg liver ROS, PUD,,,  Endo/Other  Hypothyroidism    Renal/GU negative Renal ROS Bladder dysfunction      Musculoskeletal  (+) Arthritis , Osteoarthritis,    Abdominal   Peds  Hematology Plt 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  Reproductive/Obstetrics                             Anesthesia Physical Anesthesia Plan  ASA: 3  Anesthesia Plan: Spinal   Post-op Pain Management: Ofirmev IV (intra-op)* and Toradol IV (intra-op)*   Induction: Intravenous  PONV Risk Score and Plan: 3 and TIVA, Dexamethasone and Ondansetron  Airway Management Planned: Natural Airway and Simple Face Mask  Additional Equipment:   Intra-op Plan:   Post-operative Plan:   Informed Consent: I have reviewed the patients History and Physical, chart, labs and discussed the procedure including the risks, benefits and alternatives for the proposed anesthesia with the patient or authorized representative who has indicated his/her understanding and acceptance.     Dental advisory given  Plan Discussed with: CRNA, Anesthesiologist and Surgeon  Anesthesia Plan  Comments: (Discussed risks and benefits of and differences between spinal and general. Discussed risks of spinal including headache, backache, failure, bleeding, infection, and nerve damage. Patient consents to spinal. Questions answered. Coagulation studies and platelet count acceptable.)        Anesthesia Quick Evaluation

## 2023-06-29 NOTE — Evaluation (Signed)
Physical Therapy Evaluation Patient Details Name: Andrea Martinez MRN: [id number] DOB: February 16, 1946 Today's Date: 06/29/2023  History of Present Illness  77 yo female S/P LTHA- AAon 06/29/23. PMH, R THA nd  bilateral TKA, uti, pleural effusion, leukemia, TLIF, anxiety  Clinical Impression  Pt admitted with above diagnosis.  Pt currently with functional limitations due to the deficits listed below (see PT Problem List). Pt will benefit from acute skilled PT to increase their independence and safety with mobility to allow discharge.       The patient ambulated x 60' and pleased with performance/progress. Patient  tolerated well with pain ramping up from 4-6 after return to supine. Patient should progress to return home with family support.    Assistance Recommended at Discharge Intermittent Supervision/Assistance  If plan is discharge home, recommend the following:  Can travel by private vehicle  A little help with walking and/or transfers;A little help with bathing/dressing/bathroom;Assistance with cooking/housework;Assist for transportation;Help with stairs or ramp for entrance        Equipment Recommendations None recommended by PT  Recommendations for Other Services       Functional Status Assessment Patient has had a recent decline in their functional status and demonstrates the ability to make significant improvements in function in a reasonable and predictable amount of time.     Precautions / Restrictions Precautions Precautions: Fall Restrictions Weight Bearing Restrictions: No      Mobility  Bed Mobility Overal bed mobility: Needs Assistance Bed Mobility: Supine to Sit     Supine to sit: Min assist     General bed mobility comments: assist with trunk    Transfers Overall transfer level: Needs assistance Equipment used: Rolling walker (2 wheels) Transfers: Sit to/from Stand Sit to Stand: Min assist           General transfer comment: cues for hand  placement    Ambulation/Gait Ambulation/Gait assistance: Min assist Gait Distance (Feet): 60 Feet Assistive device: Rolling walker (2 wheels) Gait Pattern/deviations: Step-to pattern, Step-through pattern       General Gait Details: gait smoothe, not antalgic  Stairs            Wheelchair Mobility     Tilt Bed    Modified Rankin (Stroke Patients Only)       Balance Overall balance assessment: Mild deficits observed, not formally tested                                           Pertinent Vitals/Pain Pain Assessment Pain Assessment: 0-10 Pain Location: 4-6 Pain Descriptors / Indicators: Aching, Discomfort, Grimacing Pain Intervention(s): Limited activity within patient's tolerance, Monitored during session, Patient requesting pain meds-RN notified    Home Living Family/patient expects to be discharged to:: Private residence Living Arrangements: Spouse/significant other Available Help at Discharge: Family;Available 24 hours/day Type of Home: House Home Access: Stairs to enter Entrance Stairs-Rails: None Entrance Stairs-Number of Steps: 1   Home Layout: One level Home Equipment: Agricultural consultant (2 wheels);Rollator (4 wheels);Standard Walker;Shower seat;Grab bars - tub/shower      Prior Function Prior Level of Function : Independent/Modified Independent             Mobility Comments: used RW due to pain       Hand Dominance   Dominant Hand: Right    Extremity/Trunk Assessment   Upper Extremity Assessment Upper Extremity Assessment: Overall WFL for tasks  assessed    Lower Extremity Assessment Lower Extremity Assessment: Overall WFL for tasks assessed;LLE deficits/detail LLE Deficits / Details: able to flex the hip in supine    Cervical / Trunk Assessment Cervical / Trunk Assessment: Normal  Communication   Communication: No difficulties  Cognition Arousal/Alertness: Awake/alert Behavior During Therapy: WFL for tasks  assessed/performed Overall Cognitive Status: Within Functional Limits for tasks assessed                                          General Comments      Exercises     Assessment/Plan    PT Assessment Patient needs continued PT services  PT Problem List Decreased strength;Decreased range of motion;Decreased activity tolerance;Decreased knowledge of use of DME;Decreased balance;Decreased safety awareness;Decreased mobility;Decreased knowledge of precautions;Pain       PT Treatment Interventions DME instruction;Therapeutic exercise;Gait training;Functional mobility training;Therapeutic activities;Patient/family education    PT Goals (Current goals can be found in the Care Plan section)  Acute Rehab PT Goals Patient Stated Goal: walk with out pain PT Goal Formulation: With patient Time For Goal Achievement: 07/06/23 Potential to Achieve Goals: Good    Frequency 7X/week     Co-evaluation               AM-PAC PT "6 Clicks" Mobility  Outcome Measure Help needed turning from your back to your side while in a flat bed without using bedrails?: A Little Help needed moving from lying on your back to sitting on the side of a flat bed without using bedrails?: A Little Help needed moving to and from a bed to a chair (including a wheelchair)?: A Little Help needed standing up from a chair using your arms (e.g., wheelchair or bedside chair)?: A Little Help needed to walk in hospital room?: A Little Help needed climbing 3-5 steps with a railing? : A Lot 6 Click Score: 17    End of Session Equipment Utilized During Treatment: Gait belt Activity Tolerance: Patient tolerated treatment well Patient left: in bed;with call bell/phone within reach;with bed alarm set;with nursing/sitter in room Nurse Communication: Mobility status;Patient requests pain meds PT Visit Diagnosis: Unsteadiness on feet (R26.81);Difficulty in walking, not elsewhere classified (R26.2);Pain Pain  - Right/Left: Left Pain - part of body: Hip    Time: 1602-1629 PT Time Calculation (min) (ACUTE ONLY): 27 min   Charges:   PT Evaluation $PT Eval Low Complexity: 1 Low PT Treatments $Gait Training: 8-22 mins PT General Charges $$ ACUTE PT VISIT: 1 Visit         Blanchard Kelch PT Acute Rehabilitation Services Office [phone] Weekend [phone]   Rada Hay 06/29/2023, 5:03 PM

## 2023-06-29 NOTE — Progress Notes (Signed)
Prior-To-Admission Oral Chemotherapy for Treatment of Oncologic Disease   Order noted from Dr. Lequita Halt to continue prior-to-admission oral chemotherapy regimen of Tasigna (nilotinib).  Procedure Per Pharmacy & Therapeutics Committee Policy: Orders for continuation of home oral chemotherapy for treatment of an oncologic disease will be held unless approved by an oncologist during current admission.    For patients receiving oncology care at [facility], inpatient pharmacist contacts patient's oncologist during regular office hours to review. If earlier review is medically necessary, attending physician consults [facility] on-call oncologist   For patients receiving oncology care outside of [facility], attending physician consults patient's oncologist to review. If this oncologist or their coverage cannot be reached, attending physician consults [facility] on-call oncologist   Oral chemotherapy continuation order is on hold pending oncologist review, [facility] oncologist [name] will be notified by inpatient pharmacy during office hours.     Cherylin Mylar, PharmD Clinical Pharmacist  7/17/20242:47 PM

## 2023-06-29 NOTE — Anesthesia Procedure Notes (Signed)
Spinal  Patient location during procedure: OR Start time: 06/29/2023 9:56 AM End time: 06/29/2023 9:59 AM Reason for block: surgical anesthesia Staffing Performed: anesthesiologist  Anesthesiologist: Collene Schlichter, MD Performed by: Collene Schlichter, MD Authorized by: Collene Schlichter, MD   Preanesthetic Checklist Completed: patient identified, IV checked, risks and benefits discussed, surgical consent, monitors and equipment checked, pre-op evaluation and timeout performed Spinal Block Patient position: sitting Prep: DuraPrep and site prepped and draped Patient monitoring: continuous pulse ox and blood pressure Approach: midline Location: L3-4 Injection technique: single-shot Needle Needle type: Pencan  Needle gauge: 24 G Assessment Events: CSF return Additional Notes Functioning IV was confirmed and monitors were applied. Sterile prep and drape, including hand hygiene, mask and sterile gloves were used. The patient was positioned and the spine was prepped. The skin was anesthetized with lidocaine.  Free flow of clear CSF was obtained prior to injecting local anesthetic into the CSF.  The spinal needle aspirated freely following injection.  The needle was carefully withdrawn.  The patient tolerated the procedure well. Consent was obtained prior to procedure with all questions answered and concerns addressed. Risks including but not limited to bleeding, infection, nerve damage, paralysis, failed block, inadequate analgesia, allergic reaction, high spinal, itching and headache were discussed and the patient wished to proceed.   Arrie Aran, MD

## 2023-06-29 NOTE — Interval H&P Note (Signed)
History and Physical Interval Note:  06/29/2023 8:18 AM  Andrea Martinez  has presented today for surgery, with the diagnosis of left hip osteoarthritis.  The various methods of treatment have been discussed with the patient and family. After consideration of risks, benefits and other options for treatment, the patient has consented to  Procedure(s): TOTAL HIP ARTHROPLASTY ANTERIOR APPROACH (Left) as a surgical intervention.  The patient's history has been reviewed, patient examined, no change in status, stable for surgery.  I have reviewed the patient's chart and labs.  Questions were answered to the patient's satisfaction.     Homero Fellers Tijana Walder

## 2023-06-29 NOTE — Progress Notes (Signed)
Prior-To-Admission Oral Chemotherapy for Treatment of Oncologic Disease   Order noted from Dr. Lequita Halt to continue prior-to-admission oral chemotherapy regimen of Tasigna (nilotinib).  Procedure Per Pharmacy & Therapeutics Committee Policy: Orders for continuation of home oral chemotherapy for treatment of an oncologic disease will be held unless approved by an oncologist during current admission.    For patients receiving oncology care at [facility], inpatient pharmacist contacts patient's oncologist during regular office hours to review. If earlier review is medically necessary, attending physician consults [facility] on-call oncologist   Received approval from Dr. [name] (via RN) to continue nilotinib while admitted postoperatively. See 7/17 general chart note (not part of this encounter) from Cynda [place], Charity fundraiser.   Bernadene Person, PharmD, BCPS [phone] 06/29/2023, 3:55 PM

## 2023-06-29 NOTE — Op Note (Signed)
OPERATIVE REPORT- TOTAL HIP ARTHROPLASTY   PREOPERATIVE DIAGNOSIS: Osteoarthritis of the Left hip.   POSTOPERATIVE DIAGNOSIS: Osteoarthritis of the Left  hip.   PROCEDURE: Left total hip arthroplasty, anterior approach.   SURGEON: Ollen Gross, MD   ASSISTANT: Arther Abbott, PA-C  ANESTHESIA:  Spinal  ESTIMATED BLOOD LOSS:-200 mL    DRAINS: None  COMPLICATIONS: None   CONDITION: PACU - hemodynamically stable.   BRIEF CLINICAL NOTE: Andrea Martinez is a 77 y.o. female who has advanced end-  stage arthritis of their Left  hip with progressively worsening pain and  dysfunction.The patient has failed nonoperative management and presents for  total hip arthroplasty.   PROCEDURE IN DETAIL: After successful administration of spinal  anesthetic, the traction boots for the [facility] bed were placed on both  feet and the patient was placed onto the [facility] bed, boots placed into the leg  holders. The Left hip was then isolated from the perineum with plastic  drapes and prepped and draped in the usual sterile fashion. ASIS and  greater trochanter were marked and a oblique incision was made, starting  at about 1 cm lateral and 2 cm distal to the ASIS and coursing towards  the anterior cortex of the femur. The skin was cut with a 10 blade  through subcutaneous tissue to the level of the fascia overlying the  tensor fascia lata muscle. The fascia was then incised in line with the  incision at the junction of the anterior third and posterior 2/3rd. The  muscle was teased off the fascia and then the interval between the TFL  and the rectus was developed. The Hohmann retractor was then placed at  the top of the femoral neck over the capsule. The vessels overlying the  capsule were cauterized and the fat on top of the capsule was removed.  A Hohmann retractor was then placed anterior underneath the rectus  femoris to give exposure to the entire anterior capsule. A T-shaped  capsulotomy  was performed. The edges were tagged and the femoral head  was identified.       Osteophytes are removed off the superior acetabulum.  The femoral neck was then cut in situ with an oscillating saw. Traction  was then applied to the left lower extremity utilizing the [facility]  traction. The femoral head was then removed. Retractors were placed  around the acetabulum and then circumferential removal of the labrum was  performed. Osteophytes were also removed. Reaming starts at 47 mm to  medialize and  Increased in 2 mm increments to 49 mm. We reamed in  approximately 40 degrees of abduction, 20 degrees anteversion. A 50 mm  pinnacle acetabular shell was then impacted in anatomic position under  fluoroscopic guidance with excellent purchase. We did not need to place  any additional dome screws. A 32 mm neutral + 4 marathon liner was then  placed into the acetabular shell.       The femoral lift was then placed along the lateral aspect of the femur  just distal to the vastus ridge. The leg was  externally rotated and capsule  was stripped off the inferior aspect of the femoral neck down to the  level of the lesser trochanter, this was done with electrocautery. The femur was lifted after this was performed. The  leg was then placed in an extended and adducted position essentially delivering the femur. We also removed the capsule superiorly and the piriformis from the piriformis fossa to  gain excellent exposure of the  proximal femur. Rongeur was used to remove some cancellous bone to get  into the lateral portion of the proximal femur for placement of the  initial starter reamer. The starter broaches was placed  the starter broach  and was shown to go down the center of the canal. Broaching  with the Actis system was then performed starting at size 0  coursing  Up to size 5. A size 5 had excellent torsional and rotational  and axial stability. The trial standard offset neck was then placed  with a 32  + 1 trial head. The hip was then reduced. We confirmed that  the stem was in the canal both on AP and lateral x-rays. It also has excellent sizing. The hip was reduced with outstanding stability through full extension and full external rotation.. AP pelvis was taken and the leg lengths were measured and found to be equal. Hip was then dislocated again and the femoral head and neck removed. The  femoral broach was removed. Size 5 Actis stem with a standard offset  neck was then impacted into the femur following native anteversion. Has  excellent purchase in the canal. Excellent torsional and rotational and  axial stability. It is confirmed to be in the canal on AP and lateral  fluoroscopic views. The 32 + 1 metal head was placed and the hip  reduced with outstanding stability. Again AP pelvis was taken and it  confirmed that the leg lengths were equal. The wound was then copiously  irrigated with saline solution and the capsule reattached and repaired  with Ethibond suture. 30 ml of .25% Bupivicaine was  injected into the capsule and into the edge of the tensor fascia lata as well as subcutaneous tissue. The fascia overlying the tensor fascia lata was then closed with a running #1 V-Loc. Subcu was closed with interrupted 2-0 Vicryl and subcuticular running 4-0 Monocryl. Incision was cleaned  and dried. Steri-Strips and a bulky sterile dressing applied. The patient was awakened and transported to  recovery in stable condition.        Please note that a surgical assistant was a medical necessity for this procedure to perform it in a safe and expeditious manner. Assistant was necessary to provide appropriate retraction of vital neurovascular structures and to prevent femoral fracture and allow for anatomic placement of the prosthesis.  Ollen Gross, M.D.

## 2023-06-29 NOTE — Progress Notes (Signed)
Per Dr. [name], may proceed with Tasigna 300mg  PO bid following surgery.

## 2023-06-29 NOTE — Care Plan (Signed)
Ortho Bundle Case Management Note  Patient Details  Name: Andrea Martinez MRN: [id number] Date of Birth: Dec 31, 1945                  L THA on 06/29/23.  DCP: Home with husband.  DME: RW ordered through [employer].  PT: HEP   DME Arranged:  Walker rolling DME Agency:  [employer]    Additional Comments: Please contact me with any questions of if this plan should need to change.    Despina Pole, Case Manager  [facility]  [phone] 06/29/2023, 8:05 AM

## 2023-06-29 NOTE — Anesthesia Procedure Notes (Signed)
Date/Time: 06/29/2023 9:56 AM  Performed by: Florene Route, CRNA

## 2023-06-29 NOTE — Transfer of Care (Signed)
Immediate Anesthesia Transfer of Care Note  Patient: Andrea Martinez  Procedure(s) Performed: LEFT TOTAL HIP ARTHROPLASTY ANTERIOR APPROACH (Left: Hip)  Patient Location: PACU  Anesthesia Type:Spinal  Level of Consciousness: drowsy  Airway & Oxygen Therapy: Patient Spontanous Breathing and Patient connected to face mask oxygen  Post-op Assessment: Report given to RN and Post -op Vital signs reviewed and stable  Post vital signs: Reviewed and stable  Last Vitals:  Vitals Value Taken Time  BP    Temp    Pulse 71 06/29/23 1136  Resp 14 06/29/23 1136  SpO2 99 % 06/29/23 1136  Vitals shown include unfiled device data.  Last Pain:  Vitals:   06/29/23 0814  TempSrc:   PainSc: 9          Complications: No notable events documented.

## 2023-06-29 NOTE — Discharge Instructions (Addendum)
Andrea Gross, MD Total Joint Specialist [facility] [place] [address] [place], [place] [address] [phone]  ANTERIOR APPROACH TOTAL HIP REPLACEMENT POSTOPERATIVE DIRECTIONS     Hip Rehabilitation, Guidelines Following Surgery  The results of a hip operation are greatly improved after range of motion and muscle strengthening exercises. Follow all safety measures which are given to protect your hip. If any of these exercises cause increased pain or swelling in your joint, decrease the amount until you are comfortable again. Then slowly increase the exercises. Call your caregiver if you have problems or questions.   BLOOD CLOT PREVENTION Take a 10 mg Xarelto once a day for three weeks following surgery.  You may resume your vitamins/supplements once you have discontinued the Xarelto.  HOME CARE INSTRUCTIONS  Remove items at home which could result in a fall. This includes throw rugs or furniture in walking pathways.  ICE to the affected hip as frequently as 20-30 minutes an hour and then as needed for pain and swelling. Continue to use ice on the hip for pain and swelling from surgery. You may notice swelling that will progress down to the foot and ankle. This is normal after surgery. Elevate the leg when you are not up walking on it.   Continue to use the breathing machine which will help keep your temperature down.  It is common for your temperature to cycle up and down following surgery, especially at night when you are not up moving around and exerting yourself.  The breathing machine keeps your lungs expanded and your temperature down.  DIET You may resume your previous home diet once your are discharged from the hospital.  DRESSING / WOUND CARE / SHOWERING You have an adhesive waterproof bandage over the incision. Leave this in place until your first follow-up appointment. Once you remove this you will not need to place another bandage.  You may begin  showering 3 days following surgery, but do not submerge the incision under water.  ACTIVITY For the first 3-5 days, it is important to rest and keep the operative leg elevated. You should, as a general rule, rest for 50 minutes and walk/stretch for 10 minutes per hour. After 5 days, you may slowly increase activity as tolerated.  Perform the exercises you were provided twice a day for about 15-20 minutes each session. Begin these 2 days following surgery. Walk with your walker as instructed. Use the walker until you are comfortable transitioning to a cane. Walk with the cane in the opposite hand of the operative leg. You may discontinue the cane once you are comfortable and walking steadily. Avoid periods of inactivity such as sitting longer than an hour when not asleep. This helps prevent blood clots.  Do not drive a car for 6 weeks or until released by your surgeon.  Do not drive while taking narcotics.  TED HOSE STOCKINGS Wear the elastic stockings on both legs for three weeks following surgery during the day. You may remove them at night while sleeping.  WEIGHT BEARING Weight bearing as tolerated with assist device (walker, cane, etc) as directed, use it as long as suggested by your surgeon or therapist, typically at least 4-6 weeks.  POSTOPERATIVE CONSTIPATION PROTOCOL Constipation - defined medically as fewer than three stools per week and severe constipation as less than one stool per week.  One of the most common issues patients have following surgery is constipation.  Even if you have a regular bowel pattern at home, your normal  regimen is likely to be disrupted due to multiple reasons following surgery.  Combination of anesthesia, postoperative narcotics, change in appetite and fluid intake all can affect your bowels.  In order to avoid complications following surgery, here are some recommendations in order to help you during your recovery period.  Colace (docusate) - Pick up an  over-the-counter form of Colace or another stool softener and take twice a day as long as you are requiring postoperative pain medications.  Take with a full glass of water daily.  If you experience loose stools or diarrhea, hold the colace until you stool forms back up.  If your symptoms do not get better within 1 week or if they get worse, check with your doctor. Dulcolax (bisacodyl) - Pick up over-the-counter and take as directed by the product packaging as needed to assist with the movement of your bowels.  Take with a full glass of water.  Use this product as needed if not relieved by Colace only.  MiraLax (polyethylene glycol) - Pick up over-the-counter to have on hand.  MiraLax is a solution that will increase the amount of water in your bowels to assist with bowel movements.  Take as directed and can mix with a glass of water, juice, soda, coffee, or tea.  Take if you go more than two days without a movement.Do not use MiraLax more than once per day. Call your doctor if you are still constipated or irregular after using this medication for 7 days in a row.  If you continue to have problems with postoperative constipation, please contact the office for further assistance and recommendations.  If you experience "the worst abdominal pain ever" or develop nausea or vomiting, please contact the office immediatly for further recommendations for treatment.  ITCHING  If you experience itching with your medications, try taking only a single pain pill, or even half a pain pill at a time.  You can also use Benadryl over the counter for itching or also to help with sleep.   MEDICATIONS See your medication summary on the "After Visit Summary" that the nursing staff will review with you prior to discharge.  You may have some home medications which will be placed on hold until you complete the course of blood thinner medication.  It is important for you to complete the blood thinner medication as prescribed by  your surgeon.  Continue your approved medications as instructed at time of discharge.  PRECAUTIONS If you experience chest pain or shortness of breath - call 911 immediately for transfer to the hospital emergency department.  If you develop a fever greater that 101 F, purulent drainage from wound, increased redness or drainage from wound, foul odor from the wound/dressing, or calf pain - CONTACT YOUR SURGEON.                                                   FOLLOW-UP APPOINTMENTS Make sure you keep all of your appointments after your operation with your surgeon and caregivers. You should call the office at the above phone number and make an appointment for approximately two weeks after the date of your surgery or on the date instructed by your surgeon outlined in the "After Visit Summary".  RANGE OF MOTION AND STRENGTHENING EXERCISES  These exercises are designed to help you keep full movement of your hip  joint. Follow your caregiver's or physical therapist's instructions. Perform all exercises about fifteen times, three times per day or as directed. Exercise both hips, even if you have had only one joint replacement. These exercises can be done on a training (exercise) mat, on the floor, on a table or on a bed. Use whatever works the best and is most comfortable for you. Use music or television while you are exercising so that the exercises are a pleasant break in your day. This will make your life better with the exercises acting as a break in routine you can look forward to.  Lying on your back, slowly slide your foot toward your buttocks, raising your knee up off the floor. Then slowly slide your foot back down until your leg is straight again.  Lying on your back spread your legs as far apart as you can without causing discomfort.  Lying on your side, raise your upper leg and foot straight up from the floor as far as is comfortable. Slowly lower the leg and repeat.  Lying on your back, tighten up  the muscle in the front of your thigh (quadriceps muscles). You can do this by keeping your leg straight and trying to raise your heel off the floor. This helps strengthen the largest muscle supporting your knee.  Lying on your back, tighten up the muscles of your buttocks both with the legs straight and with the knee bent at a comfortable angle while keeping your heel on the floor.   POST-OPERATIVE OPIOID TAPER INSTRUCTIONS: It is important to wean off of your opioid medication as soon as possible. If you do not need pain medication after your surgery it is ok to stop day one. Opioids include: Codeine, Hydrocodone(Norco, Vicodin), Oxycodone(Percocet, oxycontin) and hydromorphone amongst others.  Long term and even short term use of opiods can cause: Increased pain response Dependence Constipation Depression Respiratory depression And more.  Withdrawal symptoms can include Flu like symptoms Nausea, vomiting And more Techniques to manage these symptoms Hydrate well Eat regular healthy meals Stay active Use relaxation techniques(deep breathing, meditating, yoga) Do Not substitute Alcohol to help with tapering If you have been on opioids for less than two weeks and do not have pain than it is ok to stop all together.  Plan to wean off of opioids This plan should start within one week post op of your joint replacement. Maintain the same interval or time between taking each dose and first decrease the dose.  Cut the total daily intake of opioids by one tablet each day Next start to increase the time between doses. The last dose that should be eliminated is the evening dose.   IF YOU ARE TRANSFERRED TO A SKILLED REHAB FACILITY If the patient is transferred to a skilled rehab facility following release from the hospital, a list of the current medications will be sent to the facility for the patient to continue.  When discharged from the skilled rehab facility, please have the facility set  up the patient's Home Health Physical Therapy prior to being released. Also, the skilled facility will be responsible for providing the patient with their medications at time of release from the facility to include their pain medication, the muscle relaxants, and their blood thinner medication. If the patient is still at the rehab facility at time of the two week follow up appointment, the skilled rehab facility will also need to assist the patient in arranging follow up appointment in our office and any transportation needs.  MAKE SURE YOU:  Understand these instructions.  Get help right away if you are not doing well or get worse.    DENTAL ANTIBIOTICS:  In most cases prophylactic antibiotics for Dental procdeures after total joint surgery are not necessary.  Exceptions are as follows:  1. History of prior total joint infection  2. Severely immunocompromised (Organ Transplant, cancer chemotherapy, Rheumatoid biologic meds such as Humera)  3. Poorly controlled diabetes (A1C &gt; 8.0, blood glucose over 200)  If you have one of these conditions, contact your surgeon for an antibiotic prescription, prior to your dental procedure.    Pick up stool softner and laxative for home use following surgery while on pain medications. Do not submerge incision under water. Please use good hand washing techniques while changing dressing each day. May shower starting three days after surgery. Please use a clean towel to pat the incision dry following showers. Continue to use ice for pain and swelling after surgery. Do not use any lotions or creams on the incision until instructed by your surgeon.

## 2023-06-30 ENCOUNTER — Encounter (HOSPITAL_COMMUNITY): Payer: Self-pay | Admitting: Orthopedic Surgery

## 2023-06-30 DIAGNOSIS — M1612 Unilateral primary osteoarthritis, left hip: Secondary | ICD-10-CM | POA: Diagnosis not present

## 2023-06-30 LAB — CBC
HCT: 34 % — ABNORMAL LOW (ref 36.0–46.0)
Hemoglobin: 10.5 g/dL — ABNORMAL LOW (ref 12.0–15.0)
MCH: 29.7 pg (ref 26.0–34.0)
MCHC: 30.9 g/dL (ref 30.0–36.0)
MCV: 96.3 fL (ref 80.0–100.0)
Platelets: 199 10*3/uL (ref 150–400)
RBC: 3.53 MIL/uL — ABNORMAL LOW (ref 3.87–5.11)
RDW: 12.4 % (ref 11.5–15.5)
WBC: 24.2 10*3/uL — ABNORMAL HIGH (ref 4.0–10.5)
nRBC: 0.5 % — ABNORMAL HIGH (ref 0.0–0.2)

## 2023-06-30 LAB — BASIC METABOLIC PANEL
Anion gap: 11 (ref 5–15)
BUN: 33 mg/dL — ABNORMAL HIGH (ref 8–23)
CO2: 17 mmol/L — ABNORMAL LOW (ref 22–32)
Calcium: 8.2 mg/dL — ABNORMAL LOW (ref 8.9–10.3)
Chloride: 108 mmol/L (ref 98–111)
Creatinine, Ser: 1.12 mg/dL — ABNORMAL HIGH (ref 0.44–1.00)
GFR, Estimated: 50 mL/min — ABNORMAL LOW (ref >60)
Glucose, Bld: 162 mg/dL — ABNORMAL HIGH (ref 70–99)
Potassium: 5 mmol/L (ref 3.5–5.1)
Sodium: 136 mmol/L (ref 135–145)

## 2023-06-30 MED ORDER — MENTHOL 3 MG MT LOZG: 1.0000 | LOZENGE | OROMUCOSAL | 12 refills | Status: DC | PRN

## 2023-06-30 MED ORDER — HYDROCODONE-ACETAMINOPHEN 5-325 MG PO TABS: 1.0000 | ORAL_TABLET | Freq: Four times a day (QID) | ORAL | 0 refills | Status: DC | PRN

## 2023-06-30 MED ORDER — RIVAROXABAN 10 MG PO TABS: ORAL_TABLET | ORAL | 0 refills | Status: DC

## 2023-06-30 MED ORDER — METHOCARBAMOL 500 MG PO TABS: 500.0000 mg | ORAL_TABLET | Freq: Four times a day (QID) | ORAL | 0 refills | Status: DC | PRN

## 2023-06-30 MED ORDER — SIMETHICONE 80 MG PO CHEW
80.0000 mg | CHEWABLE_TABLET | Freq: Four times a day (QID) | ORAL | Status: DC | PRN
  Administered 2023-06-30: 80 mg via ORAL
  Filled 2023-06-30: qty 1

## 2023-06-30 MED ORDER — GLYCERIN (LAXATIVE) 2 G RE SUPP
1.0000 | Freq: Once | RECTAL | Status: AC
  Administered 2023-06-30: 1 via RECTAL
  Filled 2023-06-30: qty 1

## 2023-06-30 NOTE — Progress Notes (Signed)
   Subjective: 1 Day Post-Op Procedure(s) (LRB): LEFT TOTAL HIP ARTHROPLASTY ANTERIOR APPROACH (Left) Patient reports pain as mild.   Patient seen in rounds by Dr. Lequita Halt. Patient is well, but has had some minor complaints of abdominal distention . Denies chest pain or SOB. No issues overnight. Foley catheter removed this AM. We will continue therapy today, ambulated 60' yesterday.   Objective: Vital signs in last 24 hours: Temp:  [97.5 F (36.4 C)-98.7 F (37.1 C)] 98.7 F (37.1 C) (07/18 0623) Pulse Rate:  [57-75] 63 (07/18 0623) Resp:  [14-21] 15 (07/18 0623) BP: (125-161)/(54-86) 161/59 (07/18 0623) SpO2:  [94 %-100 %] 94 % (07/18 0623) Weight:  [97.1 kg] 97.1 kg (07/17 0814)  Intake/Output from previous day:  Intake/Output Summary (Last 24 hours) at 06/30/2023 0751 Last data filed at 06/30/2023 0600 Gross per 24 hour  Intake 3212.26 ml  Output 1005 ml  Net 2207.26 ml     Intake/Output this shift: No intake/output data recorded.  Labs: Recent Labs    06/30/23 0345  HGB 10.5*   Recent Labs    06/30/23 0345  WBC 24.2*  RBC 3.53*  HCT 34.0*  PLT 199   Recent Labs    06/30/23 0345  NA 136  K 5.0  CL 108  CO2 17*  BUN 33*  CREATININE 1.12*  GLUCOSE 162*  CALCIUM 8.2*   No results for input(s): "LABPT", "INR" in the last 72 hours.  Exam: General - Patient is Alert, Appropriate, and Oriented. Mild abdominal distention noted. Extremity - Neurovascular intact Compartment soft Dressing - dressing C/D/I Motor Function - intact, moving foot and toes well on exam.   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 [name]   Lipoma    left upper arm   Mixed hyperlipidemia    Osteoporosis    Pericardial effusion    a. HCAP complicated  by pericardial effusion requiring pericardial window 10/2016 and large L pleural effusion requring VATS.   Pleural effusion    a. s/p VATS 2017.   Pneumonia 10/2016   PONV (postoperative nausea and vomiting)    history of   Prolonged QT interval    Urine incontinence    UTI (lower urinary tract infection)    frequent   Vitamin D deficiency     Assessment/Plan: 1 Day Post-Op Procedure(s) (LRB): LEFT TOTAL HIP ARTHROPLASTY ANTERIOR APPROACH (Left) Principal Problem:   OA (osteoarthritis) of hip Active Problems:   Primary osteoarthritis of left hip  Estimated body mass index is 35.61 kg/m as calculated from the following:   Height as of this encounter: 5\' 5"  (1.651 m).   Weight as of this encounter: 97.1 kg. Up with therapy  DVT Prophylaxis - Xarelto Weight bearing as tolerated. Continue therapy.  Plan is to go Home after hospital stay. Plan for discharge with HEP later today if meeting goals and if passing gas. Suppository ordered for this morning. Follow-up in the office in 2 weeks.  The PDMP database was reviewed today prior to any opioid medications being prescribed to this patient.  Weston Brass, PA-C Orthopedic Surgery 06/30/2023, 7:51 AM

## 2023-06-30 NOTE — Plan of Care (Signed)
Pt ready to DC home with husband 

## 2023-06-30 NOTE — Care Management Obs Status (Signed)
MEDICARE OBSERVATION STATUS NOTIFICATION   Patient Details  Name: Andrea Martinez MRN: [id number] Date of Birth: July 10, 1946   Medicare Observation Status Notification Given:  Yes    Ewing Schlein, LCSW 06/30/2023, 1:18 PM

## 2023-06-30 NOTE — Progress Notes (Signed)
Physical Therapy Treatment Patient Details Name: Andrea Martinez MRN: [id number] DOB: [birth year] Today's Date: 06/30/2023   History of Present Illness 77 yo female S/P LTHA- AAon 06/29/23. PMH, R THA nd  bilateral TKA, uti, pleural effusion, leukemia, TLIF, anxiety    PT Comments   The patient has met PT goals for DC. Spouse present. Patient has made good progress.      Assistance Recommended at Discharge Intermittent Supervision/Assistance  If plan is discharge home, recommend the following:  Can travel by private vehicle    A little help with walking and/or transfers;A little help with bathing/dressing/bathroom;Assistance with cooking/housework;Assist for transportation;Help with stairs or ramp for entrance      Equipment Recommendations  None recommended by PT    Recommendations for Other Services       Precautions / Restrictions Precautions Precautions: Fall Restrictions Weight Bearing Restrictions: No     Mobility  Bed Mobility             General bed mobility comments: on  toilet    Transfers Overall transfer level: Needs assistance Equipment used: Rolling walker (2 wheels) Transfers: Sit to/from Stand Sit to Stand: Min guard           General transfer comment: ue of rail    Ambulation/Gait Ambulation/Gait assistance: Min guard Gait Distance (Feet): 20 Feet Assistive device: Rolling walker (2 wheels) Gait Pattern/deviations: Step-through pattern Gait velocity: decr     General Gait Details: gait smoothe, not antalgic   Stairs Stairs: Yes       General stair comments: pt and spouse decline that patient has any steps so did not practice 1 step, only a threshhold   Wheelchair Mobility     Tilt Bed    Modified Rankin (Stroke Patients Only)       Balance Overall balance assessment: Mild deficits observed, not formally tested                                          Cognition Arousal/Alertness:  Awake/alert Behavior During Therapy: WFL for tasks assessed/performed Overall Cognitive Status: Within Functional Limits for tasks assessed                                          Exercises   General Comments        Pertinent Vitals/Pain Pain Assessment Pain Score: 4  Pain Location: left thigh Pain Descriptors / Indicators: Sore Pain Intervention(s): Monitored during session, Premedicated before session    Home Living                          Prior Function            PT Goals (current goals can now be found in the care plan section) Progress towards PT goals: Progressing toward goals    Frequency    7X/week      PT Plan Current plan remains appropriate    Co-evaluation              AM-PAC PT "6 Clicks" Mobility   Outcome Measure  Help needed turning from your back to your side while in a flat bed without using bedrails?: A Little Help needed moving from lying on your back to sitting on the side  of a flat bed without using bedrails?: A Little Help needed moving to and from a bed to a chair (including a wheelchair)?: A Little Help needed standing up from a chair using your arms (e.g., wheelchair or bedside chair)?: A Little Help needed to walk in hospital room?: A Little Help needed climbing 3-5 steps with a railing? : A Little 6 Click Score: 18    End of Session Equipment Utilized During Treatment: Gait belt Activity Tolerance: Patient tolerated treatment well Patient left: with call bell/phone within reach;with family/visitor present;in chair Nurse Communication: Mobility status PT Visit Diagnosis: Unsteadiness on feet (R26.81);Difficulty in walking, not elsewhere classified (R26.2);Pain Pain - Right/Left: Left Pain - part of body: Hip     Time: 1055-1110 PT Time Calculation (min) (ACUTE ONLY): 15 min  Charges:    $Gait Training: 8-22 mins  PT General Charges $$ ACUTE PT VISIT: 1 Visit                      Blanchard Kelch PT Acute Rehabilitation Services Office [phone] Weekend [phone]    Rada Hay 06/30/2023, 12:45 PM

## 2023-06-30 NOTE — Anesthesia Postprocedure Evaluation (Signed)
Anesthesia Post Note  Patient: [name]  Procedure(s) Performed: LEFT TOTAL HIP ARTHROPLASTY ANTERIOR APPROACH (Left: Hip)     Patient location during evaluation: PACU Anesthesia Type: Spinal Level of consciousness: awake, awake and alert and oriented Pain management: pain level controlled Vital Signs Assessment: post-procedure vital signs reviewed and stable Respiratory status: spontaneous breathing, nonlabored ventilation and respiratory function stable Cardiovascular status: blood pressure returned to baseline and stable Postop Assessment: no headache, no backache, spinal receding and no apparent nausea or vomiting Anesthetic complications: no   No notable events documented.  Last Vitals:  Vitals:   06/30/23 0229 06/30/23 0623  BP: (!) 144/60 (!) 161/59  Pulse: 60 63  Resp: 15 15  Temp: (!) 36.4 C 37.1 C  SpO2: 97% 94%    Last Pain:  Vitals:   06/30/23 0623  TempSrc: Oral  PainSc:                  Collene Schlichter

## 2023-06-30 NOTE — TOC Transition Note (Signed)
Transition of Care [facility]) - CM/SW Discharge Note  Patient Details  Name: Andrea Martinez MRN: [id number] Date of Birth: 11-01-1946  Transition of Care [facility]) CM/SW Contact:  Ewing Schlein, LCSW Phone Number: 06/30/2023, 9:59 AM  Clinical Narrative: Patient is expected to discharge home after working with PT. CSW met with patient and spouse, Andrea Martinez, to confirm discharge plan. Patient will go home with a home exercise program (HEP). Patient has a rolling walker, BSC, shower chair, and raised toilet seat at home so there are no DME needs at this time. TOC signing off.  Final next level of care: Home/Self Care Barriers to Discharge: No Barriers Identified  Discharge Plan and Services Additional resources added to the After Visit Summary for          DME Arranged: N/A DME Agency: NA  Social Determinants of Health (SDOH) Interventions SDOH Screenings   Food Insecurity: No Food Insecurity (06/29/2023)  Housing: Low Risk  (06/29/2023)  Transportation Needs: No Transportation Needs (06/29/2023)  Utilities: Not At Risk (06/29/2023)  Alcohol Screen: Low Risk  (05/23/2023)  Depression (PHQ2-9): Medium Risk (06/22/2023)  Financial Resource Strain: Low Risk  (05/23/2023)  Physical Activity: Insufficiently Active (05/23/2023)  Social Connections: Moderately Integrated (05/23/2023)  Stress: No Stress Concern Present (05/23/2023)  Tobacco Use: Low Risk  (06/29/2023)   Readmission Risk Interventions     No data to display

## 2023-06-30 NOTE — Progress Notes (Signed)
Physical Therapy Treatment Patient Details Name: Andrea Martinez MRN: [id number] DOB: 19-Feb-1946 Today's Date: 06/30/2023   History of Present Illness 77 yo female S/P LTHA- AAon 06/29/23. PMH, R THA nd  bilateral TKA, uti, pleural effusion, leukemia, TLIF, anxiety    PT Comments  The patient is progressing well. Increased distance ambulating, performed HEP, spouse present. Patient should be able to DC after another walk when out of BR.     Assistance Recommended at Discharge Intermittent Supervision/Assistance  If plan is discharge home, recommend the following:  Can travel by private vehicle    A little help with walking and/or transfers;A little help with bathing/dressing/bathroom;Assistance with cooking/housework;Assist for transportation;Help with stairs or ramp for entrance      Equipment Recommendations  None recommended by PT    Recommendations for Other Services       Precautions / Restrictions Precautions Precautions: Fall Restrictions Weight Bearing Restrictions: No     Mobility  Bed Mobility   Bed Mobility: Supine to Sit     Supine to sit: Min assist     General bed mobility comments: use of belt to move LLE, moving to the left side. extra time but patient managed to perform without assistance.    Transfers Overall transfer level: Needs assistance Equipment used: Rolling walker (2 wheels) Transfers: Sit to/from Stand Sit to Stand: Min guard           General transfer comment: cues for hand placement,    Ambulation/Gait Ambulation/Gait assistance: Min guard Gait Distance (Feet): 120 Feet Assistive device: Rolling walker (2 wheels) Gait Pattern/deviations: Step-through pattern Gait velocity: decr     General Gait Details: gait smoothe, not antalgic   Stairs             Wheelchair Mobility     Tilt Bed    Modified Rankin (Stroke Patients Only)       Balance Overall balance assessment: Mild deficits observed, not formally  tested                                          Cognition Arousal/Alertness: Awake/alert Behavior During Therapy: WFL for tasks assessed/performed Overall Cognitive Status: Within Functional Limits for tasks assessed                                          Exercises Total Joint Exercises Ankle Circles/Pumps: AROM, Both, 10 reps Quad Sets: AROM, Both, 10 reps Short Arc Quad: AROM, Left, 10 reps Heel Slides: AAROM, Left, 10 reps Hip ABduction/ADduction: AAROM, Left, 10 reps    General Comments        Pertinent Vitals/Pain Pain Assessment Pain Score: 5  Pain Location: left thigh Pain Descriptors / Indicators: Aching, Discomfort, Grimacing Pain Intervention(s): Monitored during session, Premedicated before session    Home Living                          Prior Function            PT Goals (current goals can now be found in the care plan section) Progress towards PT goals: Progressing toward goals    Frequency    7X/week      PT Plan Current plan remains appropriate    Co-evaluation  AM-PAC PT "6 Clicks" Mobility   Outcome Measure  Help needed turning from your back to your side while in a flat bed without using bedrails?: A Little Help needed moving from lying on your back to sitting on the side of a flat bed without using bedrails?: A Little Help needed moving to and from a bed to a chair (including a wheelchair)?: A Little Help needed standing up from a chair using your arms (e.g., wheelchair or bedside chair)?: A Little Help needed to walk in hospital room?: A Little Help needed climbing 3-5 steps with a railing? : A Little 6 Click Score: 18    End of Session Equipment Utilized During Treatment: Gait belt Activity Tolerance: Patient tolerated treatment well Patient left: with call bell/phone within reach;with family/visitor present (in BR, spouse present) Nurse Communication: Mobility  status PT Visit Diagnosis: Unsteadiness on feet (R26.81);Difficulty in walking, not elsewhere classified (R26.2);Pain Pain - Right/Left: Left Pain - part of body: Hip     Time: 1026-1050 PT Time Calculation (min) (ACUTE ONLY): 24 min  Charges:    $Gait Training: 8-22 mins $Therapeutic Exercise: 8-22 mins PT General Charges $$ ACUTE PT VISIT: 1 Visit                     Blanchard Kelch PT Acute Rehabilitation Services Office [phone] Weekend pager-[phone]    Rada Hay 06/30/2023, 12:24 PM

## 2023-07-11 NOTE — Discharge Summary (Signed)
Physician Discharge Summary   Patient ID: Andrea Martinez MRN: [id number] DOB/AGE: [birth year] 77 y.o.  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 [name]   Lipoma    left upper arm   Mixed hyperlipidemia    Osteoporosis    Pericardial effusion    a. HCAP complicated by pericardial effusion requiring pericardial window 10/2016 and large L pleural effusion requring VATS.   Pleural effusion    a. s/p VATS 2017.   Pneumonia 10/2016   PONV (postoperative nausea and vomiting)    history of   Prolonged QT interval    Urine incontinence    UTI (lower urinary tract infection)    frequent   Vitamin D deficiency    Discharge Diagnoses:   Principal Problem:   OA (osteoarthritis) of hip Active Problems:   Primary osteoarthritis of left hip  Estimated body mass index is 35.61 kg/m as calculated from the following:   Height as of this encounter: 5\' 5"  (1.651 m).   Weight as of this encounter: 97.1 kg.  Procedure:  Procedure(s) (LRB): LEFT TOTAL HIP ARTHROPLASTY ANTERIOR APPROACH (Left)   Consults: None  HPI: Andrea Martinez is a 77 y.o. female who has advanced end-  stage arthritis of their Left  hip with progressively worsening pain and  dysfunction.The patient has failed nonoperative management and presents for  total hip arthroplasty.   Laboratory Data: Admission on 06/29/2023, Discharged on 06/30/2023  Component Date Value Ref Range Status   WBC 06/30/2023 24.2 (H)  4.0 - 10.5 K/uL Final   RBC 06/30/2023 3.53 (L)  3.87 - 5.11 MIL/uL Final   Hemoglobin 06/30/2023 10.5 (L)  12.0 - 15.0 g/dL Final   HCT 40/98/1191 34.0 (L)  36.0 - 46.0 %  Final   MCV 06/30/2023 96.3  80.0 - 100.0 fL Final   MCH 06/30/2023 29.7  26.0 - 34.0 pg Final   MCHC 06/30/2023 30.9  30.0 - 36.0 g/dL Final   RDW 47/82/9562 12.4  11.5 - 15.5 % Final   Platelets 06/30/2023 199  150 - 400 K/uL Final   nRBC 06/30/2023 0.5 (H)  0.0 - 0.2 % Final   Performed at [facility], [address], [place], [place] [address]   Sodium 06/30/2023 136  135 - 145 mmol/L Final   Potassium 06/30/2023 5.0  3.5 - 5.1 mmol/L Final   Chloride 06/30/2023 108  98 - 111 mmol/L Final   CO2 06/30/2023 17 (L)  22 - 32 mmol/L Final   Glucose, Bld 06/30/2023 162 (H)  70 - 99 mg/dL Final   Glucose reference range applies only to samples taken after fasting for at least 8 hours.   BUN 06/30/2023 33 (H)  8 - 23 mg/dL Final   Creatinine, Ser 06/30/2023 1.12 (H)  0.44 - 1.00 mg/dL Final   Calcium 57/84/6962 8.2 (L)  8.9 - 10.3 mg/dL Final   GFR, Estimated 06/30/2023 50 (L)  >60 mL/min Final   Comment: (NOTE) Calculated using the CKD-EPI Creatinine Equation (2021)    Anion gap 06/30/2023 11  5 - 15 Final   Performed at [facility], [address] [place], [place] [address]  Hospital Outpatient Visit on 06/23/2023  Component Date Value Ref Range Status   MRSA, PCR 06/23/2023 NEGATIVE  NEGATIVE Final   Staphylococcus aureus 06/23/2023 NEGATIVE  NEGATIVE Final   Comment: (NOTE) The Xpert SA Assay (FDA approved for NASAL specimens in patients 60 years of age and older), is one component of a comprehensive surveillance program. It is not intended to diagnose infection nor to guide or monitor treatment. Performed at [facility], [address] [place], [place] [address]    ABO/RH(D) 06/23/2023 A POS   Final   Antibody Screen 06/23/2023 NEG   Final   Sample Expiration 06/23/2023 07/02/2023,2359   Final   Extend sample reason 06/23/2023    Final                   Value:NO TRANSFUSIONS OR PREGNANCY IN THE PAST 3  MONTHS Performed at [facility], [address] [place], [place] [address]   Office Visit on 06/22/2023  Component Date Value Ref Range Status   Glucose 06/22/2023 96  70 - 99 mg/dL Final   BUN 24/40/1027 24  8 - 27 mg/dL Final   Creatinine, Ser 06/22/2023 1.03 (H)  0.57 - 1.00 mg/dL Final   eGFR 25/36/6440 56 (L)  >59 mL/min/1.73 Final   BUN/Creatinine Ratio 06/22/2023 23  12 - 28 Final   Sodium 06/22/2023 143  134 - 144 mmol/L Final   Potassium 06/22/2023 5.4 (H)  3.5 - 5.2 mmol/L Final   Chloride 06/22/2023 106  96 - 106 mmol/L Final   CO2 06/22/2023 23  20 - 29 mmol/L Final   Calcium 06/22/2023 9.7  8.7 - 10.3 mg/dL Final   Total Protein 34/74/2595 7.3  6.0 - 8.5 g/dL Final   Albumin 63/87/5643 4.5  3.8 - 4.8 g/dL Final   Globulin, Total 06/22/2023 2.8  1.5 - 4.5 g/dL Final   Bilirubin Total 06/22/2023 0.6  0.0 - 1.2 mg/dL Final   Alkaline Phosphatase 06/22/2023 214 (H)  44 - 121 IU/L Final   AST 06/22/2023 29  0 - 40 IU/L Final   ALT 06/22/2023 18  0 - 32 IU/L Final   WBC 06/22/2023 9.6  3.4 - 10.8 x10E3/uL Final   RBC 06/22/2023 4.23  3.77 - 5.28 x10E6/uL Final   Hemoglobin 06/22/2023 12.9  11.1 - 15.9 g/dL Final   Hematocrit 32/95/1884 38.7  34.0 - 46.6 % Final   MCV 06/22/2023 92  79 - 97 fL Final   MCH 06/22/2023 30.5  26.6 - 33.0 pg Final   MCHC 06/22/2023 33.3  31.5 - 35.7 g/dL Final   RDW 16/60/6301 11.8  11.7 - 15.4 % Final   Platelets 06/22/2023 255  150 - 450 x10E3/uL Final   Neutrophils 06/22/2023 78  Not Estab. % Final   Lymphs 06/22/2023 14  Not Estab. % Final   Monocytes 06/22/2023 6  Not Estab. % Final   Eos 06/22/2023 2  Not Estab. % Final   Basos 06/22/2023 0  Not Estab. % Final   Neutrophils Absolute 06/22/2023 7.4 (H)  1.4 - 7.0 x10E3/uL Final   Lymphocytes Absolute 06/22/2023 1.3  0.7 - 3.1 x10E3/uL Final   Monocytes Absolute 06/22/2023 0.6  0.1 - 0.9 x10E3/uL Final   EOS (ABSOLUTE) 06/22/2023 0.2  0.0 - 0.4 x10E3/uL Final    Basophils Absolute 06/22/2023 0.0  0.0 - 0.2 x10E3/uL Final   Immature Granulocytes 06/22/2023 0  Not  Estab. % Final   Immature Grans (Abs) 06/22/2023 0.0  0.0 - 0.1 x10E3/uL Final   HB A1C (BAYER DCA - WAIVED) 06/22/2023 5.2  4.8 - 5.6 % Final   Comment:          Prediabetes: 5.7 - 6.4          Diabetes: >6.4          Glycemic control for adults with diabetes: <7.0   Admission on 05/10/2023, Discharged on 05/10/2023  Component Date Value Ref Range Status   SURGICAL PATHOLOGY 05/10/2023    Final-Edited                   Value:SURGICAL PATHOLOGY CASE: [id number] PATIENT: [facility] Surgical Pathology Report     Clinical History: Colitis, epigastric pain, RLQ pain (crm)     FINAL MICROSCOPIC DIAGNOSIS:  A. DUODENAL BULB, BIOPSY: -  Duodenal mucosa with prominent Brunner's glands and foveolar metaplasia suggestive of chronic peptic duodenitis.  B. STOMACH, ANTRUM, BIOPSY: -  Antral mucosa with reactive/reparative change and foveolar hyperplasia. -  No Helicobacter pylori organisms identified on HE stained slide.  C. STOMACH, BODY, BIOPSY: -  Oxyntic mucosa with no significant pathology. -  No Helicobacter pylori organisms identified on HE stained slide.  D. COLON, CECUM, POLYPECTOMY: -  Tubular adenoma.  E. COLON, SIGMOID, POLYPECTOMY: -  Tubular adenoma.    GROSS DESCRIPTION:  A: Received in formalin are tan, soft tissue fragments that are submitted in toto. Number: 3 size: 0.2-0.4 cm blocks: 1  B: Received in formalin are tan, soft tissue fragments that are submi                         tted in toto. Number: 3 size: 0.2-0.4 cm blocks: 1  C: Received in formalin are tan, soft tissue fragments that are submitted in toto. Number: 2 size: 0.4 and 0.5 cm blocks: 1  D: Received in formalin is a tan, soft tissue fragment that is submitted in toto.  Size: 0.7 cm, 1 block submitted.  E: Received in formalin is a tan, soft tissue fragment that is submitted in  toto.  Size: 0.2 cm, 1 block submitted.  (GRP 05/10/2023)   Final Diagnosis performed by Orene Desanctis DO.   Electronically signed 05/11/2023 Technical component performed at [facility], [address], [place], [place] [address].  Professional component performed at [facility], [address] [place], [place] [address].  Immunohistochemistry Technical component (if applicable) was performed at [facility]. [address], [place], [place] [address].   IMMUNOHISTOCHEMISTRY DISCLAIMER (if applicable): Some of these immunohistochemical                          stains may have been developed and the performance characteristics determine by [facility]. Some may not have been cleared or approved by the U.S. Food and Drug Administration. The FDA has determined that such clearance or approval is not necessary. This test is used for clinical purposes. It should not be regarded as investigational or for research. This laboratory is certified under the Clinical Laboratory Improvement Amendments of 1988 (CLIA-88) as qualified to perform high complexity clinical laboratory testing.  The controls stained appropriately.   IHC stains are performed on formalin fixed, paraffin embedded tissue using a 3,3"diaminobenzidine (DAB) chromogen and Leica Bond Autostainer System. The staining intensity of the nucleus is score manually  and is reported as the percentage of tumor cell nuclei demonstrating specific nuclear staining. The specimens are fixed in 10% Neutral Formalin for at least 6 hours and up to 72hrs. These tests are validate                         d on decalcified tissue. Results should be interpreted with caution given the possibility of false negative results on decalcified specimens. Antibody Clones are as follows ER-clone [id number], PR-clone 16, Ki67- clone MM1. Some of these immunohistochemical stains may have been  developed and the performance characteristics determined by [facility].      X-Rays:DG HIP UNILAT WITH PELVIS 1V LEFT  Result Date: 06/29/2023 CLINICAL DATA:  Left hip arthroplasty EXAM: DG HIP (WITH OR WITHOUT PELVIS) 1V*L*; PORTABLE PELVIS 1-2 VIEWS; DG C-ARM 1-60 MIN-NO REPORT COMPARISON:  None Available. FLUOROSCOPY: Air kerma 1.21 mGy FINDINGS: Status post left hip total arthroplasty with expected overlying postoperative change. No evidence of perihardware fracture or component malpositioning. Pre-existing right hip arthroplasty. IMPRESSION: Status post left hip total arthroplasty with expected overlying postoperative change. No evidence of perihardware fracture or component malpositioning. Electronically Signed   By: Jearld Lesch M.D.   On: 06/29/2023 15:05   DG Pelvis Portable  Result Date: 06/29/2023 CLINICAL DATA:  Left hip arthroplasty EXAM: DG HIP (WITH OR WITHOUT PELVIS) 1V*L*; PORTABLE PELVIS 1-2 VIEWS; DG C-ARM 1-60 MIN-NO REPORT COMPARISON:  None Available. FLUOROSCOPY: Air kerma 1.21 mGy FINDINGS: Status post left hip total arthroplasty with expected overlying postoperative change. No evidence of perihardware fracture or component malpositioning. Pre-existing right hip arthroplasty. IMPRESSION: Status post left hip total arthroplasty with expected overlying postoperative change. No evidence of perihardware fracture or component malpositioning. Electronically Signed   By: Jearld Lesch M.D.   On: 06/29/2023 15:05   DG C-Arm 1-60 Min-No Report  Result Date: 06/29/2023 Fluoroscopy was utilized by the requesting physician.  No radiographic interpretation.    EKG: Orders placed or performed during the hospital encounter of 05/10/23   EKG 12-LEAD   EKG 12-LEAD     Hospital Course: Andrea Martinez is a 77 y.o. who was admitted to [facility]. They were brought to the operating room on 06/29/2023 and underwent Procedure(s): LEFT TOTAL HIP ARTHROPLASTY ANTERIOR  APPROACH.  Patient tolerated the procedure well and was later transferred to the recovery room and then to the orthopaedic floor for postoperative care. They were given PO and IV analgesics for pain control following their surgery. They were given 24 hours of postoperative antibiotics of  Anti-infectives (From admission, onward)    Start     Dose/Rate Route Frequency Ordered Stop   06/29/23 1600  ceFAZolin (ANCEF) IVPB 2g/100 mL premix        2 g 200 mL/hr over 30 Minutes Intravenous Every 6 hours 06/29/23 1359 06/29/23 2151   06/29/23 0715  ceFAZolin (ANCEF) IVPB 2g/100 mL premix        2 g 200 mL/hr over 30 Minutes Intravenous On call to O.R. 06/29/23 0706 06/29/23 1006      and started on DVT prophylaxis in the form of Xarelto.   PT and OT were ordered for total joint protocol. Discharge planning consulted to help with postop disposition and equipment needs.  Patient had an uneventful night on the evening of surgery. They started to get up OOB with therapy on POD 1. Pt was seen during rounds and was ready to go home pending progress with  therapy. She worked with therapy on POD #1 and was meeting her goals. Pt was discharged to home later that day in stable condition.  Diet: Regular diet Activity: WBAT Follow-up: in 2 weeks Disposition: Rehab Discharged Condition: good   Discharge Instructions     Call MD / Call 911   Complete by: As directed    If you experience chest pain or shortness of breath, CALL 911 and be transported to the hospital emergency room.  If you develope a fever above 101 F, pus (white drainage) or increased drainage or redness at the wound, or calf pain, call your surgeon's office.   Change dressing   Complete by: As directed    You have an adhesive waterproof bandage over the incision. Leave this in place until your first follow-up appointment. Once you remove this you will not need to place another bandage.   Constipation Prevention   Complete by: As directed     Drink plenty of fluids.  Prune juice may be helpful.  You may use a stool softener, such as Colace (over the counter) 100 mg twice a day.  Use MiraLax (over the counter) for constipation as needed.   Diet - low sodium heart healthy   Complete by: As directed    Do not sit on low chairs, stoools or toilet seats, as it may be difficult to get up from low surfaces   Complete by: As directed    Driving restrictions   Complete by: As directed    No driving for two weeks   Post-operative opioid taper instructions:   Complete by: As directed    POST-OPERATIVE OPIOID TAPER INSTRUCTIONS: It is important to wean off of your opioid medication as soon as possible. If you do not need pain medication after your surgery it is ok to stop day one. Opioids include: Codeine, Hydrocodone(Norco, Vicodin), Oxycodone(Percocet, oxycontin) and hydromorphone amongst others.  Long term and even short term use of opiods can cause: Increased pain response Dependence Constipation Depression Respiratory depression And more.  Withdrawal symptoms can include Flu like symptoms Nausea, vomiting And more Techniques to manage these symptoms Hydrate well Eat regular healthy meals Stay active Use relaxation techniques(deep breathing, meditating, yoga) Do Not substitute Alcohol to help with tapering If you have been on opioids for less than two weeks and do not have pain than it is ok to stop all together.  Plan to wean off of opioids This plan should start within one week post op of your joint replacement. Maintain the same interval or time between taking each dose and first decrease the dose.  Cut the total daily intake of opioids by one tablet each day Next start to increase the time between doses. The last dose that should be eliminated is the evening dose.      TED hose   Complete by: As directed    Use stockings (TED hose) for three weeks on both leg(s).  You may remove them at night for sleeping.   Weight  bearing as tolerated   Complete by: As directed       Allergies as of 06/30/2023       Reactions   Nucynta [tapentadol Hcl] Swelling   Codeine Nausea Only, Other (See Comments)   Headache   Sulfonamide Derivatives Nausea Only, Other (See Comments)   Headache   Nexium [esomeprazole Magnesium] Nausea Only   Sulfa Antibiotics Nausea Only   Headache    Fluconazole Rash   Redness and blistering on left  thigh        Medication List     STOP taking these medications    CENTRUM SILVER PO   ibuprofen 200 MG tablet Commonly known as: ADVIL       TAKE these medications    acetaminophen 500 MG tablet Commonly known as: TYLENOL Take 1,000 mg by mouth every 8 (eight) hours as needed for moderate pain.   cetirizine 10 MG tablet Commonly known as: ZYRTEC Take 10 mg by mouth daily.   citalopram 20 MG tablet Commonly known as: CELEXA TAKE 1 TABLET BY MOUTH EVERY DAY   Digestive Adv Prebiot+Probiot Chew Chew 2 each by mouth daily.   HYDROcodone-acetaminophen 5-325 MG tablet Commonly known as: NORCO/VICODIN Take 1-2 tablets by mouth every 6 (six) hours as needed (moderate to severe pain).   lactose free nutrition Liqd Take 237 mLs by mouth daily.   levothyroxine 88 MCG tablet Commonly known as: SYNTHROID TAKE 1 TABLET BY MOUTH EVERY DAY BEFORE BREAKFAST   methocarbamol 500 MG tablet Commonly known as: ROBAXIN Take 1 tablet (500 mg total) by mouth every 6 (six) hours as needed for muscle spasms.   nilotinib 150 MG capsule Commonly known as: TASIGNA Take 2 capsules (300 mg total) by mouth every 12 (twelve) hours.   rivaroxaban 10 MG Tabs tablet Commonly known as: XARELTO Take 1 tablet daily for 3 weeks. Then take 81mg  aspirin daily for 3 weeks. Then discontinue.               Discharge Care Instructions  (From admission, onward)           Start     Ordered   06/30/23 0000  Weight bearing as tolerated        06/30/23 1149   06/30/23 0000  Change  dressing       Comments: You have an adhesive waterproof bandage over the incision. Leave this in place until your first follow-up appointment. Once you remove this you will not need to place another bandage.   06/30/23 1149            Follow-up Information     Ollen Gross, MD. Go on 07/12/2023.   Specialty: Orthopedic Surgery Why: You are scheduled for first post op appt on Tuesday July 30 at 4:30pm. Contact information: [address] [place] [address] [place] [address] [phone]                 Signed: Weston Brass, PA-C Orthopedic Surgery 07/11/2023, 4:03 PM

## 2023-07-13 ENCOUNTER — Ambulatory Visit: Payer: PPO | Admitting: Internal Medicine

## 2023-07-13 ENCOUNTER — Telehealth: Payer: Self-pay | Admitting: Internal Medicine

## 2023-07-13 NOTE — Telephone Encounter (Signed)
Patient came to [facility] and arrived at 2pm for her 215 appt.  I explained to her that her appt was at [facility]. And we had left a message on her home answering machine and she said no one gave her the appt.  She was upset that she had come to here and now had to go to [facility].  She went on to say how she just had hip replacement surgery 2 weeks ago.  She was very upset that she came to the wrong office.  She said that she wasn't going to [facility]. She just didn't have the energy to go up there since having the hip surgery.... patient was walking with a walker.

## 2023-07-19 ENCOUNTER — Ambulatory Visit: Payer: PPO | Admitting: Family Medicine

## 2023-07-26 ENCOUNTER — Inpatient Hospital Stay: Payer: PPO | Attending: Hematology

## 2023-07-26 DIAGNOSIS — C9210 Chronic myeloid leukemia, BCR/ABL-positive, not having achieved remission: Secondary | ICD-10-CM | POA: Insufficient documentation

## 2023-07-26 DIAGNOSIS — Z801 Family history of malignant neoplasm of trachea, bronchus and lung: Secondary | ICD-10-CM | POA: Diagnosis not present

## 2023-07-26 DIAGNOSIS — D649 Anemia, unspecified: Secondary | ICD-10-CM | POA: Insufficient documentation

## 2023-07-26 DIAGNOSIS — R5383 Other fatigue: Secondary | ICD-10-CM | POA: Insufficient documentation

## 2023-07-26 DIAGNOSIS — D509 Iron deficiency anemia, unspecified: Secondary | ICD-10-CM

## 2023-07-26 DIAGNOSIS — R61 Generalized hyperhidrosis: Secondary | ICD-10-CM | POA: Diagnosis not present

## 2023-07-26 LAB — CBC WITH DIFFERENTIAL/PLATELET
Abs Immature Granulocytes: 0.19 10*3/uL — ABNORMAL HIGH (ref 0.00–0.07)
Basophils Absolute: 0 10*3/uL (ref 0.0–0.1)
Basophils Relative: 0 %
Eosinophils Absolute: 0.2 10*3/uL (ref 0.0–0.5)
Eosinophils Relative: 2 %
HCT: 35.4 % — ABNORMAL LOW (ref 36.0–46.0)
Hemoglobin: 10.9 g/dL — ABNORMAL LOW (ref 12.0–15.0)
Immature Granulocytes: 2 %
Lymphocytes Relative: 13 %
Lymphs Abs: 1.5 10*3/uL (ref 0.7–4.0)
MCH: 28.6 pg (ref 26.0–34.0)
MCHC: 30.8 g/dL (ref 30.0–36.0)
MCV: 92.9 fL (ref 80.0–100.0)
Monocytes Absolute: 0.8 10*3/uL (ref 0.1–1.0)
Monocytes Relative: 7 %
Neutro Abs: 9.3 10*3/uL — ABNORMAL HIGH (ref 1.7–7.7)
Neutrophils Relative %: 76 %
Platelets: 317 10*3/uL (ref 150–400)
RBC: 3.81 MIL/uL — ABNORMAL LOW (ref 3.87–5.11)
RDW: 12.5 % (ref 11.5–15.5)
WBC: 12.1 10*3/uL — ABNORMAL HIGH (ref 4.0–10.5)
nRBC: 0 % (ref 0.0–0.2)

## 2023-07-26 LAB — IRON AND TIBC
Iron: 34 ug/dL (ref 28–170)
Saturation Ratios: 13 % (ref 10.4–31.8)
TIBC: 264 ug/dL (ref 250–450)
UIBC: 230 ug/dL

## 2023-07-26 LAB — COMPREHENSIVE METABOLIC PANEL
ALT: 18 U/L (ref 0–44)
AST: 30 U/L (ref 15–41)
Albumin: 3.7 g/dL (ref 3.5–5.0)
Alkaline Phosphatase: 195 U/L — ABNORMAL HIGH (ref 38–126)
Anion gap: 12 (ref 5–15)
BUN: 26 mg/dL — ABNORMAL HIGH (ref 8–23)
CO2: 21 mmol/L — ABNORMAL LOW (ref 22–32)
Calcium: 9.1 mg/dL (ref 8.9–10.3)
Chloride: 103 mmol/L (ref 98–111)
Creatinine, Ser: 1.09 mg/dL — ABNORMAL HIGH (ref 0.44–1.00)
GFR, Estimated: 52 mL/min — ABNORMAL LOW (ref >60)
Glucose, Bld: 112 mg/dL — ABNORMAL HIGH (ref 70–99)
Potassium: 4.7 mmol/L (ref 3.5–5.1)
Sodium: 136 mmol/L (ref 135–145)
Total Bilirubin: 0.7 mg/dL (ref 0.3–1.2)
Total Protein: 7.8 g/dL (ref 6.5–8.1)

## 2023-07-26 LAB — LACTATE DEHYDROGENASE: LDH: 164 U/L (ref 98–192)

## 2023-07-26 LAB — FERRITIN: Ferritin: 309 ng/mL — ABNORMAL HIGH (ref 11–307)

## 2023-08-02 DIAGNOSIS — Z471 Aftercare following joint replacement surgery: Secondary | ICD-10-CM | POA: Diagnosis not present

## 2023-08-02 DIAGNOSIS — Z96652 Presence of left artificial knee joint: Secondary | ICD-10-CM | POA: Diagnosis not present

## 2023-08-02 LAB — BCR-ABL1, CML/ALL, PCR, QUANT
E1A2 Transcript: <0.0032 %
b2a2 transcript: <0.0032 %

## 2023-08-03 DIAGNOSIS — E782 Mixed hyperlipidemia: Secondary | ICD-10-CM | POA: Diagnosis not present

## 2023-08-03 DIAGNOSIS — E039 Hypothyroidism, unspecified: Secondary | ICD-10-CM | POA: Diagnosis not present

## 2023-08-04 LAB — T4, FREE: Free T4: 1.26 ng/dL (ref 0.82–1.77)

## 2023-08-05 ENCOUNTER — Ambulatory Visit: Payer: PPO | Admitting: Family Medicine

## 2023-08-09 ENCOUNTER — Inpatient Hospital Stay: Payer: PPO | Admitting: Hematology

## 2023-08-09 VITALS — BP 148/61 | HR 79 | Temp 99.0°F | Resp 16 | Wt 217.9 lb

## 2023-08-09 DIAGNOSIS — C9210 Chronic myeloid leukemia, BCR/ABL-positive, not having achieved remission: Secondary | ICD-10-CM | POA: Diagnosis not present

## 2023-08-09 DIAGNOSIS — D509 Iron deficiency anemia, unspecified: Secondary | ICD-10-CM | POA: Diagnosis not present

## 2023-08-09 NOTE — Progress Notes (Signed)
[facility] [address] [place] [address]    Clinic Day:  08/09/2023  Referring physician: [name], FNP  Patient Care Team: [name], FNP as PCP - General (Family Medicine) [name], MD as Medical Oncologist (Medical Oncology) [name], MD as Consulting Physician (Endocrinology) [name], MD as Consulting Physician (Orthopedic Surgery)   ASSESSMENT & PLAN:   Assessment: 1.  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 -Left pleural effusion and pericardial effusion requiring pericardial window on 10/25/2016.  Fluid cytology negative. -Started back on Nilotinib 300 mg every 12 hours on 12/06/2016.   2.  Daytime sweats: -He had a long history of daytime sweats.  She had extensive work-up including a CT scan CAP, which showed no evidence of malignancy.  Hepatic morphology suspicious for cirrhosis.    Plan: 1.  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  She complains of fatigue since surgery.  Occasional daytime sweats most likely from nilotinib stable. - Reviewed labs from from 07/26/2023: White count 12.1, platelet count normal.  BCR/ABL by PCR on 07/26/2023 undetectable. - I think fatigue is most likely postsurgical.  Recommend follow-up in 4 months with repeat labs including BCR/ABL by PCR.  If her energy levels are back to normal, we will switch her back to 67-month visits.   2.  Normocytic anemia: - Last Feraheme on 02/15/2023. - Hemoglobin improved to 12 but has come down to 10.5 after her recent hip surgery.  Hemoglobin latest is 10.9.  Ferritin is 309 and percent saturation 13.  Will repeat ferritin and iron panel in 4 months.    Orders Placed This Encounter  Procedures   CBC with Differential/Platelet    Standing Status:   Future    Standing Expiration Date:   08/08/2024    Order Specific Question:   Release to patient    Answer:   Immediate   Ferritin    Standing  Status:   Future    Standing Expiration Date:   08/08/2024    Order Specific Question:   Release to patient    Answer:   Immediate   Iron and TIBC    Standing Status:   Future    Standing Expiration Date:   08/08/2024    Order Specific Question:   Release to patient    Answer:   Immediate   Comprehensive metabolic panel    Standing Status:   Future    Standing Expiration Date:   08/08/2024    Order Specific Question:   Release to patient    Answer:   Immediate   Lactate dehydrogenase    Standing Status:   Future    Standing Expiration Date:   08/08/2024    Order Specific Question:   Release to patient    Answer:   Immediate   BCR-ABL1, CML/ALL, PCR, QUANT    Standing Status:   Future    Standing Expiration Date:   08/08/2024      Andrea Martinez,acting as a scribe for [name], MD.,have documented all relevant documentation on the behalf of [name], MD,as directed by  [name], MD while in the presence of [name], MD.  I, [name] MD, have reviewed the above documentation for accuracy and completeness, and I agree with the above.    [name], MD   8/27/20243:48 PM  CHIEF COMPLAINT:   Diagnosis: CML    Cancer Staging  No matching staging information was found for the patient.    Prior Therapy: Hydrea 1000 mg and Allopurinol 300 mg daily  Current Therapy:  Tasigna 300 mg Q 12 H    HISTORY OF PRESENT ILLNESS:   Oncology History  CML (chronic myeloid leukemia) (HCC)  11/21/2015 Initial Diagnosis   Chronic myelogenous leukemia (CML), BCR-ABL1-positive (HCC)   11/21/2015 Miscellaneous   Seen by Dr. Lowell Guitar [facility]   11/21/2015 -  Chemotherapy   Hydrea 1000 mg and Allopurinol 300 mg daily (due to high uric acid level)   11/21/2015 Tumor Marker   BCR P210 Ratio of Fusion to Control, Blood 0.8941    P210 IS % Ratio, Blood 85.834%     01/02/2016 - 10/23/2016 Chemotherapy   Tasigna   01/08/2016  Miscellaneous   EKG- QT/QTc: 382/440 ms   01/16/2016 Miscellaneous   EKG- QT/QTc: 364/419   03/30/2016 Tumor Marker   b2a2 transcript: 0.317 b3a2 transcript: 0.194   10/23/2016 - 11/05/2016 Hospital Admission   large Left Pleural Effusion and Pericardial Effusion that is now s/p VATS with Drainage, and Drainage of Pericardial Fluid with Window and Biopsy of Pericardium   12/06/2016 -  Chemotherapy   Tasigna restarted    05/24/2017 Adverse Reaction   Prolonged QTc on EKG; likely d/t chemo. Tasigna ON HOLD until next visit.    06/10/2017 -  Chemotherapy   Resumed Tasigna   07/13/2017 Miscellaneous   Evaluated by Cardiology for prolonged QTc. Per Dr. Purvis Sheffield, patient's QT intervals are not prolonged to the point of potentially promoting a ventricular arrhythmia. He thinks she can continue on current medical therapy for her CML with serial ECG monitoring.   12/27/2017 Miscellaneous   EKG: QT/QTc 398/435      INTERVAL HISTORY:   Andrea Martinez is a 77 y.o. female presenting to clinic today for follow up of CML. She was last seen by me on 05/04/23.  Since her last visit, she underwent a colonoscopy on 05/10/23 with Dr. Marletta Lor. Surgical pathology of a duodenal bulb biopsy revealed: duodenal mucosa with prominent Brunner's glands and foveolar metaplasia suggestive of chronic peptic duodenitis. A biopsy of antrum of the stomach revealed: antral mucosa with reactive/reparative change and foveolar hyperplasia and no Helicobacter pylori organisms identified. A biopsy of the body of the stomach revealed: oxyntic mucosa with no significant pathology and no Helicobacter pylori organisms identified. A polypectomies of the cecum  and sigmoid of the colon revealed: tubular adenoma.   Of note, she underwent left total hip arthroplasty on 06/29/23 with Dr. Lequita Halt.   Today, she states that she is doing well overall. Her appetite level is at 100%. Her energy level is at 0%. She is accompanied by her husband.   She  c/o severe fatigue and noticed no improvement in energy with her last iron infusion. She c/o profuse sweating that is on and off during the day and night due to Tasigna treatment. She has an appointment with Dr. Fransico Him on Thursday who told her eat fruit and vegetables only. Dr. Fransico Him reportedly was going to give her shots to help her lose weight, but recommended a diet instead. She notes since she has had multiple surgeries she has gained weight and has not been as active as she used to be. She currently drinks 1 Boost a day.   PAST MEDICAL HISTORY:   Past Medical History: Past Medical History:  Diagnosis Date   Anxiety    denies   Arthritis    Chronic myelogenous leukemia (CML), BCR-ABL1-positive (HCC)  11/20/2015   Gastric ulcer 2011   EGD, 5/11   History of peptic ulcer disease    Hypothyroidism    not on meds, followed by Dr. [name]   Lipoma    left upper arm   Mixed hyperlipidemia    Osteoporosis    Pericardial effusion    a. HCAP complicated by pericardial effusion requiring pericardial window 10/2016 and large L pleural effusion requring VATS.   Pleural effusion    a. s/p VATS 2017.   Pneumonia 10/2016   PONV (postoperative nausea and vomiting)    history of   Prolonged QT interval    Urine incontinence    UTI (lower urinary tract infection)    frequent   Vitamin D deficiency     Surgical History: Past Surgical History:  Procedure Laterality Date   BIOPSY  03/20/2022   Procedure: BIOPSY;  Surgeon: Lanelle Bal, DO;  [facility];  Service: Endoscopy;;  gastric lesion vs. gastric ulcer   BIOPSY  06/28/2022   Procedure: BIOPSY;  Surgeon: Lanelle Bal, DO;  [facility];  Service: Endoscopy;;   BIOPSY  05/10/2023   Procedure: BIOPSY;  Surgeon: Lanelle Bal, DO;  [facility];  Service: Endoscopy;;   BRAVO PH STUDY  12/04/2012   Procedure: BRAVO PH STUDY;  Surgeon: West Bali, MD;  [facility];  Service:  Endoscopy;  Laterality: N/A;   CHOLECYSTECTOMY N/A 01/03/2014   Procedure: LAPAROSCOPIC CHOLECYSTECTOMY WITH INTRAOPERATIVE CHOLANGIOGRAM;  Surgeon: Clovis Pu. Cornett, MD;  Location: [facility];  Service: General;  Laterality: N/A;   COLONOSCOPY  01/2004   DR [facility], few small tics   COLONOSCOPY WITH PROPOFOL N/A 05/10/2023   Procedure: COLONOSCOPY WITH PROPOFOL;  Surgeon: Lanelle Bal, DO;  [facility];  Service: Endoscopy;  Laterality: N/A;  1245pm, asa 3   ESOPHAGOGASTRODUODENOSCOPY  02/2010   gastric ulcers   ESOPHAGOGASTRODUODENOSCOPY  12/04/2012   [id number] gastritis (inflammation) was found in the gastric antrum; multiple biopsies The duodenal mucosa showed no abnormalities in the bulb and second portion of the duodenum   ESOPHAGOGASTRODUODENOSCOPY (EGD) WITH PROPOFOL N/A 03/20/2022   Procedure: ESOPHAGOGASTRODUODENOSCOPY (EGD) WITH PROPOFOL;  Surgeon: Lanelle Bal, DO;  [facility];  Service: Endoscopy;  Laterality: N/A;   ESOPHAGOGASTRODUODENOSCOPY (EGD) WITH PROPOFOL N/A 06/28/2022   Procedure: ESOPHAGOGASTRODUODENOSCOPY (EGD) WITH PROPOFOL;  Surgeon: Lanelle Bal, DO;  [facility];  Service: Endoscopy;  Laterality: N/A;  800   ESOPHAGOGASTRODUODENOSCOPY (EGD) WITH PROPOFOL N/A 05/10/2023   Procedure: ESOPHAGOGASTRODUODENOSCOPY (EGD) WITH PROPOFOL;  Surgeon: Lanelle Bal, DO;  [facility];  Service: Endoscopy;  Laterality: N/A;   JOINT REPLACEMENT Right 10/22/2019   KYPHOPLASTY N/A 02/14/2019   Procedure: KYPHOPLASTY T12 and L1;  Surgeon: [name], MD;  [facility]: [facility];  Service: Orthopedics;  Laterality: N/A;  120 mins   LASIK     LIPOMA EXCISION  08/02/2011   left shoulder   NOSE SURGERY     PARTIAL HYSTERECTOMY     vaginal at age 57 years of age   POLYPECTOMY  05/10/2023   Procedure: POLYPECTOMY;  Surgeon: Lanelle Bal, DO;  [facility];  Service: Endoscopy;;   TOE  DEBRIDEMENT Left 1962   lt great toe   TOTAL HIP ARTHROPLASTY Right 11/18/2021   Procedure: TOTAL HIP ARTHROPLASTY ANTERIOR APPROACH;  Surgeon: Ollen Gross, MD;  Location: [facility];  Service: Orthopedics;  Laterality: Right;  TOTAL HIP ARTHROPLASTY Left 06/29/2023   Procedure: LEFT TOTAL HIP ARTHROPLASTY ANTERIOR APPROACH;  Surgeon: Ollen Gross, MD;  Location: [facility];  Service: Orthopedics;  Laterality: Left;   TOTAL KNEE ARTHROPLASTY Right 10/22/2019   Procedure: TOTAL KNEE ARTHROPLASTY;  Surgeon: Ollen Gross, MD;  Location: [facility];  Service: Orthopedics;  Laterality: Right;    TOTAL KNEE ARTHROPLASTY Left 03/03/2020   Procedure: TOTAL KNEE ARTHROPLASTY;  Surgeon: Ollen Gross, MD;  Location: [facility];  Service: Orthopedics;  Laterality: Left;    TRANSFORAMINAL LUMBAR INTERBODY FUSION (TLIF) WITH PEDICLE SCREW FIXATION 1 LEVEL N/A 01/28/2021   Procedure: TRANSFORAMINAL LUMBAR INTERBODY FUSION (TLIF) LUMBAR FIVE-SACRAL ONE;  Surgeon: [name], MD;  Location: [facility];  Service: Orthopedics;  Laterality: N/A;  4 hrs   VIDEO ASSISTED THORACOSCOPY (VATS)/EMPYEMA Left 10/25/2016   Procedure: VIDEO ASSISTED THORACOSCOPY (VATS), BRONCH,DRAINAGE OF PLEURAL EFFUSION,PERICARDIAL WINDOW WITH DRAINAGE OF PERICARDIAL FLUID, TEE;  Surgeon: Loreli Slot, MD;  Location: [facility];  Service: Thoracic;  Laterality: Left;   VIDEO BRONCHOSCOPY N/A 10/25/2016   Procedure: VIDEO BRONCHOSCOPY;  Surgeon: Loreli Slot, MD;  Location: [facility];  Service: Thoracic;  Laterality: N/A;    Social History: Social History   Socioeconomic History   Marital status: Married    Spouse name: Greggory Stallion   Number of children: 2   Years of education: 12th grade   Highest education level: Not on file  Occupational History   Occupation: retired    Associate Professor: UENPLOYED    Employer: RETIRED  Tobacco Use   Smoking status: Never    Passive exposure: Never   Smokeless tobacco: Never  Vaping Use    Vaping status: Never Used  Substance and Sexual Activity   Alcohol use: No    Alcohol/week: 0.0 standard drinks of alcohol   Drug use: No   Sexual activity: Yes    Partners: Male    Birth control/protection: None  Other Topics Concern   Not on file  Social History Narrative   1 son, 1 daughter.   2 granddaughters.   Retired HR.    Social Determinants of Health   Financial Resource Strain: Low Risk  (05/23/2023)   Overall Financial Resource Strain (CARDIA)    Difficulty of Paying Living Expenses: Not hard at all  Food Insecurity: No Food Insecurity (06/29/2023)   Hunger Vital Sign    Worried About Running Out of Food in the Last Year: Never true    Ran Out of Food in the Last Year: Never true  Transportation Needs: No Transportation Needs (06/29/2023)   PRAPARE - Administrator, Civil Service (Medical): No    Lack of Transportation (Non-Medical): No  Physical Activity: Insufficiently Active (05/23/2023)   Exercise Vital Sign    Days of Exercise per Week: 3 days    Minutes of Exercise per Session: 30 min  Stress: No Stress Concern Present (05/23/2023)   [employer] of Occupational Health - Occupational Stress Questionnaire    Feeling of Stress : Not at all  Social Connections: Moderately Integrated (05/23/2023)   Social Connection and Isolation Panel [NHANES]    Frequency of Communication with Friends and Family: More than three times a week    Frequency of Social Gatherings with Friends and Family: More than three times a week    Attends Religious Services: More than 4 times per year    Active Member of [employer] or Organizations: No    Attends Banker Meetings: Never  Marital Status: Married  Catering manager Violence: Not At Risk (06/29/2023)   Humiliation, Afraid, Rape, and Kick questionnaire    Fear of Current or Ex-Partner: No    Emotionally Abused: No    Physically Abused: No    Sexually Abused: No    Family History: Family History   Problem Relation Age of Onset   Lung cancer Father    COPD Mother    Stroke Mother    Thyroid disease Mother    Colon cancer Neg Hx    Anesthesia problems Neg Hx    Hypotension Neg Hx    Malignant hyperthermia Neg Hx    Pseudochol deficiency Neg Hx     Current Medications:  Current Outpatient Medications:    cetirizine (ZYRTEC) 10 MG tablet, Take 10 mg by mouth daily., Disp: , Rfl:    citalopram (CELEXA) 20 MG tablet, TAKE 1 TABLET BY MOUTH EVERY DAY, Disp: 90 tablet, Rfl: 1   HYDROcodone-acetaminophen (NORCO) 10-325 MG tablet, Take by mouth., Disp: , Rfl:    lactose free nutrition (BOOST) LIQD, Take 237 mLs by mouth daily., Disp: , Rfl:    levothyroxine (SYNTHROID) 88 MCG tablet, TAKE 1 TABLET BY MOUTH EVERY DAY BEFORE BREAKFAST, Disp: 90 tablet, Rfl: 1   nilotinib (TASIGNA) 150 MG capsule, Take 2 capsules (300 mg total) by mouth every 12 (twelve) hours., Disp: 120 capsule, Rfl: 12   Probiotic Product (DIGESTIVE ADV PREBIOT+PROBIOT) CHEW, Chew 2 each by mouth daily., Disp: , Rfl:    Allergies: Allergies  Allergen Reactions   Nucynta [Tapentadol Hcl] Swelling   Codeine Nausea Only and Other (See Comments)    Headache   Sulfonamide Derivatives Nausea Only and Other (See Comments)    Headache   Nexium [Esomeprazole Magnesium] Nausea Only   Sulfa Antibiotics Nausea Only    Headache    Fluconazole Rash    Redness and blistering on left thigh    REVIEW OF SYSTEMS:   Review of Systems  Constitutional:  Positive for fatigue. Negative for chills and fever.  HENT:   Negative for lump/mass, mouth sores, nosebleeds, sore throat and trouble swallowing.   Eyes:  Negative for eye problems.  Respiratory:  Negative for cough and shortness of breath.   Cardiovascular:  Negative for chest pain, leg swelling and palpitations.  Gastrointestinal:  Positive for constipation. Negative for abdominal pain, diarrhea, nausea and vomiting.  Genitourinary:  Positive for bladder incontinence.  Negative for difficulty urinating, dysuria, frequency, hematuria and nocturia.   Musculoskeletal:  Positive for arthralgias (right hip, 6/10 severity). Negative for back pain, flank pain, myalgias and neck pain.  Skin:  Negative for itching and rash.  Neurological:  Negative for dizziness, headaches and numbness.  Hematological:  Does not bruise/bleed easily.  Psychiatric/Behavioral:  Positive for sleep disturbance. Negative for depression and suicidal ideas. The patient is not nervous/anxious.   All other systems reviewed and are negative.    VITALS:   Blood pressure (!) 148/61, pulse 79, temperature 99 F (37.2 C), temperature source Oral, resp. rate 16, weight 217 lb 14.4 oz (98.8 kg), SpO2 97%.  Wt Readings from Last 3 Encounters:  08/09/23 217 lb 14.4 oz (98.8 kg)  06/29/23 214 lb (97.1 kg)  06/23/23 215 lb (97.5 kg)    Body mass index is 36.26 kg/m.  Performance status (ECOG): 1 - Symptomatic but completely ambulatory  PHYSICAL EXAM:   Physical Exam Vitals and nursing note reviewed. Exam conducted with a chaperone present.  Constitutional:  Appearance: Normal appearance.  Cardiovascular:     Rate and Rhythm: Normal rate and regular rhythm.     Pulses: Normal pulses.     Heart sounds: Normal heart sounds.  Pulmonary:     Effort: Pulmonary effort is normal.     Breath sounds: Normal breath sounds.  Abdominal:     Palpations: Abdomen is soft. There is no hepatomegaly, splenomegaly or mass.     Tenderness: There is no abdominal tenderness.  Musculoskeletal:     Right lower leg: No edema.     Left lower leg: No edema.  Lymphadenopathy:     Cervical: No cervical adenopathy.     Right cervical: No superficial, deep or posterior cervical adenopathy.    Left cervical: No superficial, deep or posterior cervical adenopathy.     Upper Body:     Right upper body: No supraclavicular or axillary adenopathy.     Left upper body: No supraclavicular or axillary adenopathy.   Neurological:     General: No focal deficit present.     Mental Status: She is alert and oriented to person, place, and time.  Psychiatric:        Mood and Affect: Mood normal.        Behavior: Behavior normal.     LABS:      Latest Ref Rng & Units 07/26/2023    1:17 PM 06/30/2023    3:45 AM 06/22/2023   11:43 AM  CBC  WBC 4.0 - 10.5 K/uL 12.1  24.2  9.6   Hemoglobin 12.0 - 15.0 g/dL 16.1  09.6  04.5   Hematocrit 36.0 - 46.0 % 35.4  34.0  38.7   Platelets 150 - 400 K/uL 317  199  255       Latest Ref Rng & Units 07/26/2023    1:17 PM 06/30/2023    3:45 AM 06/22/2023   11:43 AM  CMP  Glucose 70 - 99 mg/dL 409  811  96   BUN 8 - 23 mg/dL 26  33  24   Creatinine 0.44 - 1.00 mg/dL 9.14  7.82  9.56   Sodium 135 - 145 mmol/L 136  136  143   Potassium 3.5 - 5.1 mmol/L 4.7  5.0  5.4   Chloride 98 - 111 mmol/L 103  108  106   CO2 22 - 32 mmol/L 21  17  23    Calcium 8.9 - 10.3 mg/dL 9.1  8.2  9.7   Total Protein 6.5 - 8.1 g/dL 7.8   7.3   Total Bilirubin 0.3 - 1.2 mg/dL 0.7   0.6   Alkaline Phos 38 - 126 U/L 195   214   AST 15 - 41 U/L 30   29   ALT 0 - 44 U/L 18   18      No results found for: "CEA1", "CEA" / No results found for: "CEA1", "CEA" No results found for: "PSA1" No results found for: "OZH086" No results found for: "CAN125"  No results found for: "TOTALPROTELP", "ALBUMINELP", "A1GS", "A2GS", "BETS", "BETA2SER", "GAMS", "MSPIKE", "SPEI" Lab Results  Component Value Date   TIBC 264 07/26/2023   TIBC 304 04/27/2023   TIBC 271 03/10/2023   FERRITIN 309 (H) 07/26/2023   FERRITIN 292 04/27/2023   FERRITIN 749 (H) 03/10/2023   IRONPCTSAT 13 07/26/2023   IRONPCTSAT 14 04/27/2023   IRONPCTSAT 23 03/10/2023   Lab Results  Component Value Date   LDH 164 07/26/2023   LDH 150 04/27/2023  LDH 152 01/20/2023     STUDIES:   No results found.

## 2023-08-09 NOTE — Patient Instructions (Signed)
[facility]  Discharge Instructions  You were seen and examined today by Dr. [name].  Dr. [name] discussed your most recent lab work which revealed that everything looks good.  Your energy should improve in a couple months from surgery.  Follow-up as scheduled in 4 months.    Thank you for choosing [facility] to provide your oncology and hematology care.   To afford each patient quality time with our provider, please arrive at least 15 minutes before your scheduled appointment time. You may need to reschedule your appointment if you arrive late (10 or more minutes). Arriving late affects you and other patients whose appointments are after yours.  Also, if you miss three or more appointments without notifying the office, you may be dismissed from the clinic at the provider's discretion.    Again, thank you for choosing [facility].  Our hope is that these requests will decrease the amount of time that you wait before being seen by our physicians.   If you have a lab appointment with the Cancer Center - please note that after April 8th, all labs will be drawn in the cancer center.  You do not have to check in or register with the main entrance as you have in the past but will complete your check-in at the cancer center.            _____________________________________________________________  Should you have questions after your visit to [facility], please contact our office at [phone] and follow the prompts.  Our office hours are 8:00 a.m. to 4:30 p.m. Monday - Thursday and 8:00 a.m. to 2:30 p.m. Friday.  Please note that voicemails left after 4:00 p.m. may not be returned until the following business day.  We are closed weekends and all major holidays.  You do have access to a nurse 24-7, just call the main number to the clinic [phone] and do not press any options, hold on the line and a  nurse will answer the phone.    For prescription refill requests, have your pharmacy contact our office and allow 72 hours.    Masks are no longer required in the cancer centers. If you would like for your care team to wear a mask while they are taking care of you, please let them know. You may have one support person who is at least 78 years old accompany you for your appointments.

## 2023-08-11 ENCOUNTER — Ambulatory Visit: Payer: PPO | Admitting: "Endocrinology

## 2023-08-11 ENCOUNTER — Encounter: Payer: Self-pay | Admitting: "Endocrinology

## 2023-08-11 VITALS — BP 126/58 | HR 68 | Ht 65.0 in | Wt 219.8 lb

## 2023-08-11 DIAGNOSIS — E782 Mixed hyperlipidemia: Secondary | ICD-10-CM

## 2023-08-11 DIAGNOSIS — E039 Hypothyroidism, unspecified: Secondary | ICD-10-CM | POA: Diagnosis not present

## 2023-08-11 MED ORDER — ROSUVASTATIN CALCIUM 5 MG PO TABS: 5.0000 mg | ORAL_TABLET | Freq: Every evening | ORAL | 1 refills | Status: DC

## 2023-08-11 NOTE — Progress Notes (Signed)
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 [name]   Lipoma    left upper arm   Mixed hyperlipidemia    Osteoporosis    Pericardial effusion    a. HCAP complicated by pericardial effusion requiring pericardial window 10/2016 and large L pleural effusion requring VATS.   Pleural effusion    a. s/p VATS 2017.   Pneumonia 10/2016   PONV (postoperative nausea and vomiting)    history of   Prolonged QT interval    Urine incontinence    UTI (lower urinary tract infection)    frequent   Vitamin D deficiency    Past Surgical History:  Procedure Laterality Date   BIOPSY  03/20/2022   Procedure: BIOPSY;  Surgeon: Lanelle Bal, DO;  Location: [facility];  Service: Endoscopy;;  gastric lesion vs. gastric ulcer   BIOPSY  06/28/2022   Procedure: BIOPSY;  Surgeon: Lanelle Bal, DO;  [facility]: [facility];  Service: Endoscopy;;   BIOPSY  05/10/2023   Procedure: BIOPSY;  Surgeon: Lanelle Bal, DO;  [facility]: [facility];  Service: Endoscopy;;   BRAVO PH STUDY  12/04/2012   Procedure: BRAVO PH STUDY;  Surgeon: West Bali, MD;  Location: [facility];  Service: Endoscopy;  Laterality: N/A;   CHOLECYSTECTOMY N/A 01/03/2014   Procedure: LAPAROSCOPIC CHOLECYSTECTOMY WITH INTRAOPERATIVE CHOLANGIOGRAM;  Surgeon: Clovis Pu. Cornett, MD;  Location: [facility];  Service: General;  Laterality: N/A;   COLONOSCOPY  01/2004   DR [facility], few small tics   COLONOSCOPY WITH PROPOFOL N/A 05/10/2023   Procedure: COLONOSCOPY WITH PROPOFOL;  Surgeon: Lanelle Bal, DO;  [facility]: [facility];   Service: Endoscopy;  Laterality: N/A;  1245pm, asa 3   ESOPHAGOGASTRODUODENOSCOPY  02/2010   gastric ulcers   ESOPHAGOGASTRODUODENOSCOPY  12/04/2012   [id number] gastritis (inflammation) was found in the gastric antrum; multiple biopsies The duodenal mucosa showed no abnormalities in the bulb and second portion of the duodenum   ESOPHAGOGASTRODUODENOSCOPY (EGD) WITH PROPOFOL N/A 03/20/2022   Procedure: ESOPHAGOGASTRODUODENOSCOPY (EGD) WITH PROPOFOL;  Surgeon: Lanelle Bal, DO;  [facility];  Service: Endoscopy;  Laterality: N/A;   ESOPHAGOGASTRODUODENOSCOPY (EGD) WITH PROPOFOL N/A 06/28/2022   Procedure: ESOPHAGOGASTRODUODENOSCOPY (EGD) WITH PROPOFOL;  Surgeon: Lanelle Bal, DO;  [facility];  Service: Endoscopy;  Laterality: N/A;  800   ESOPHAGOGASTRODUODENOSCOPY (EGD) WITH PROPOFOL N/A 05/10/2023   Procedure: ESOPHAGOGASTRODUODENOSCOPY (EGD) WITH PROPOFOL;  Surgeon: Lanelle Bal, DO;  [facility];  Service: Endoscopy;  Laterality: N/A;   JOINT REPLACEMENT Right 10/22/2019   KYPHOPLASTY N/A 02/14/2019   Procedure: KYPHOPLASTY T12 and L1;  Surgeon: [name], MD;  Location: [facility];  Service: Orthopedics;  Laterality: N/A;  120 mins   LASIK     LIPOMA EXCISION  08/02/2011   left shoulder   NOSE SURGERY     PARTIAL HYSTERECTOMY     vaginal at age 55 years of age   POLYPECTOMY  05/10/2023   Procedure: POLYPECTOMY;  Surgeon: Lanelle Bal, DO;  [facility]: [facility];  Service: Endoscopy;;   TOE DEBRIDEMENT Left 1962   lt great toe   TOTAL HIP ARTHROPLASTY Right 11/18/2021   Procedure: TOTAL HIP ARTHROPLASTY ANTERIOR APPROACH;  Surgeon: Ollen Gross, MD;  Location: [facility];  Service: Orthopedics;  Laterality: Right;   TOTAL HIP ARTHROPLASTY Left 06/29/2023   Procedure: LEFT TOTAL HIP ARTHROPLASTY ANTERIOR APPROACH;  Surgeon: Ollen Gross, MD;  Location: [facility];  Service: Orthopedics;  Laterality: Left;   TOTAL KNEE ARTHROPLASTY  Right 10/22/2019   Procedure: TOTAL KNEE ARTHROPLASTY;  Surgeon: Ollen Gross, MD;  Location: [facility];  Service: Orthopedics;  Laterality: Right;    TOTAL KNEE ARTHROPLASTY Left 03/03/2020   Procedure: TOTAL KNEE ARTHROPLASTY;  Surgeon: Ollen Gross, MD;  Location: [facility];  Service: Orthopedics;  Laterality: Left;    TRANSFORAMINAL LUMBAR INTERBODY FUSION (TLIF) WITH PEDICLE SCREW FIXATION 1 LEVEL N/A 01/28/2021   Procedure: TRANSFORAMINAL LUMBAR INTERBODY FUSION (TLIF) LUMBAR FIVE-SACRAL ONE;  Surgeon: [name], MD;  Location: [facility];  Service: Orthopedics;  Laterality: N/A;  4 hrs   VIDEO ASSISTED THORACOSCOPY (VATS)/EMPYEMA Left 10/25/2016   Procedure: VIDEO ASSISTED THORACOSCOPY (VATS), BRONCH,DRAINAGE OF PLEURAL EFFUSION,PERICARDIAL WINDOW WITH DRAINAGE OF PERICARDIAL FLUID, TEE;  Surgeon: Loreli Slot, MD;  Location: [facility];  Service: Thoracic;  Laterality: Left;   VIDEO BRONCHOSCOPY N/A 10/25/2016   Procedure: VIDEO BRONCHOSCOPY;  Surgeon: Loreli Slot, MD;  Location: [facility];  Service: Thoracic;  Laterality: N/A;   Social History   Socioeconomic History   Marital status: Married    Spouse name: Greggory Stallion   Number of children: 2   Years of education: 12th grade   Highest education level: Not on file  Occupational History   Occupation: retired    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   2 granddaughters.   Retired HR.    Social Determinants of Health   Financial Resource Strain: Low Risk  (05/23/2023)   Overall Financial Resource Strain  (CARDIA)    Difficulty of Paying Living Expenses: Not hard at all  Food Insecurity: No Food Insecurity (06/29/2023)   Hunger Vital Sign    Worried About Running Out of Food in the Last Year: Never true    Ran Out of Food in the Last Year: Never true  Transportation Needs: No Transportation Needs (06/29/2023)   PRAPARE - Administrator, Civil Service (Medical): No    Lack of Transportation (Non-Medical): No  Physical Activity: Insufficiently Active (05/23/2023)   Exercise Vital Sign    Days of Exercise per Week: 3 days    Minutes of Exercise per Session: 30 min  Stress: No Stress Concern Present (05/23/2023)   [employer] of Occupational Health - Occupational Stress Questionnaire    Feeling of Stress : Not at all  Social Connections: Moderately Integrated (05/23/2023)   Social Connection and Isolation Panel [NHANES]    Frequency of Communication with Friends and Family: More than  three times a week    Frequency of Social Gatherings with Friends and Family: More than three times a week    Attends Religious Services: More than 4 times per year    Active Member of Clubs or Organizations: No    Attends Banker Meetings: Never    Marital Status: Married   Outpatient Encounter Medications as of 08/11/2023  Medication Sig   rosuvastatin (CRESTOR) 5 MG tablet Take 1 tablet (5 mg total) by mouth at bedtime.   cetirizine (ZYRTEC) 10 MG tablet Take 10 mg by mouth daily.   citalopram (CELEXA) 20 MG tablet TAKE 1 TABLET BY MOUTH EVERY DAY   HYDROcodone-acetaminophen (NORCO) 10-325 MG tablet Take by mouth.   lactose free nutrition (BOOST) LIQD Take 237 mLs by mouth daily.   levothyroxine (SYNTHROID) 88 MCG tablet TAKE 1 TABLET BY MOUTH EVERY DAY BEFORE BREAKFAST   nilotinib (TASIGNA) 150 MG capsule Take 2 capsules (300 mg total) by mouth every 12 (twelve) hours.   Probiotic Product (DIGESTIVE ADV PREBIOT+PROBIOT) CHEW Chew 2 each by mouth daily.   No  facility-administered encounter medications on file as of 08/11/2023.   ALLERGIES: Allergies  Allergen Reactions   Nucynta [Tapentadol Hcl] Swelling   Codeine Nausea Only and Other (See Comments)    Headache   Sulfonamide Derivatives Nausea Only and Other (See Comments)    Headache   Nexium [Esomeprazole Magnesium] Nausea Only   Sulfa Antibiotics Nausea Only    Headache    Fluconazole Rash    Redness and blistering on left thigh    VACCINATION STATUS: Immunization History  Administered Date(s) Administered   Fluad Quad(high Dose 65+) 09/11/2020, 09/24/2021   Influenza,inj,Quad PF,6+ Mos 09/13/2017   Influenza-Unspecified 09/24/2015, 11/24/2017   Moderna Sars-Covid-2 Vaccination 04/02/2020, 04/30/2020, 01/07/2021   Pneumococcal Conjugate-13 10/09/2014   Pneumococcal Polysaccharide-23 10/09/2014    HPI  77 year old female patient with medical history as above.   She is being seen in follow-up for hypothyroidism.  She is currently on levothyroxine 88 mcg p.o. daily before breakfast.  She reports consistency and compliance with her medication.  Her previsit labs are consistent with appropriate replacement.  She underwent hip replacement recently, recovering from her surgery.   She denies chest pain, shortness of breath. She denies palpitations, tremors, nor heat intolerance.   She is still on nilotinib for CML.    -She has family history of thyroid problem involving goiter which required thyroidectomy in her mother who took thyroid hormone replacement subsequently. -She denies dysphagia, shortness of breath, voice change. -She is compliant to her medications.  She also has significant dyslipidemia, not on treatment. -She has uncontrolled dyslipidemia, not on statins.  She could not engage with the lifestyle medicine nutrition discussed during her last visit.  She is open for statin at this time.  Review of Systems Limited as above.  Objective:    BP (!) 126/58   Pulse 68   Ht  5\' 5"  (1.651 m)   Wt 219 lb 12.8 oz (99.7 kg)   BMI 36.58 kg/m   Wt Readings from Last 3 Encounters:  08/11/23 219 lb 12.8 oz (99.7 kg)  08/09/23 217 lb 14.4 oz (98.8 kg)  06/29/23 214 lb (97.1 kg)     CMP     Component Value Date/Time   NA 136 07/26/2023 1317   NA 143 06/22/2023 1143   K 4.7 07/26/2023 1317   CL 103 07/26/2023 1317   CO2 21 (L) 07/26/2023 1317   GLUCOSE 112 (  H) 07/26/2023 1317   BUN 26 (H) 07/26/2023 1317   BUN 24 06/22/2023 1143   CREATININE 1.09 (H) 07/26/2023 1317   CREATININE 0.87 07/14/2011 0938   CALCIUM 9.1 07/26/2023 1317   PROT 7.8 07/26/2023 1317   PROT 7.3 06/22/2023 1143   ALBUMIN 3.7 07/26/2023 1317   ALBUMIN 4.5 06/22/2023 1143   AST 30 07/26/2023 1317   ALT 18 07/26/2023 1317   ALKPHOS 195 (H) 07/26/2023 1317   BILITOT 0.7 07/26/2023 1317   BILITOT 0.6 06/22/2023 1143   GFRNONAA 52 (L) 07/26/2023 1317   GFRAA >60 08/21/2020 1340   Diabetic Labs (most recent): Lab Results  Component Value Date   HGBA1C 5.2 06/22/2023   HGBA1C 5.1 10/30/2021   HGBA1C 5.3 04/24/2019   Lipid Panel     Component Value Date/Time   CHOL 207 (H) 08/03/2023 0937   TRIG 153 (H) 08/03/2023 0937   HDL 57 08/03/2023 0937   CHOLHDL 3.6 08/03/2023 0937   LDLCALC 123 (H) 08/03/2023 0937   LABVLDL 27 08/03/2023 0937    Latest Reference Range & Units 02/02/23 13:48  TSH 0.450 - 4.500 uIU/mL 0.819  T4,Free(Direct) 0.82 - 1.77 ng/dL 1.61    Assessment & Plan:   1. Hypothyroidism  2.  Hyperlipidemia - Her thyroid function tests are consistent with appropriate replacement.  She is advised to continue levothyroxine 88 mcg p.o. daily before breakfast.     - We discussed about the correct intake of her thyroid hormone, on empty stomach at fasting, with water, separated by at least 30 minutes from breakfast and other medications,  and separated by more than 4 hours from calcium, iron, multivitamins, acid reflux medications (PPIs). -Patient is made aware of the  fact that thyroid hormone replacement is needed for life, dose to be adjusted by periodic monitoring of thyroid function tests.   Regarding her hyperlipidemia: She could not engage with lifestyle medicine nutrition discussed with her.  She is open for statin at this time. Prescribed Crestor 5 g p.o. nightly.  Side effects and precautions discussed with her.     - Her clinical exam has been  negative for goiter, hence no need for imaging of the thyroid at this time.   - I advised patient to maintain close follow up with Rakes, Doralee Albino, FNP for primary care needs.   I spent  22  minutes in the care of the patient today including review of labs from Thyroid Function, CMP, and other relevant labs ; imaging/biopsy records (current and previous including abstractions from other facilities); face-to-face time discussing  her lab results and symptoms, medications doses, her options of short and long term treatment based on the latest standards of care / guidelines;   and documenting the encounter.  Andrea Martinez  participated in the discussions, expressed understanding, and voiced agreement with the above plans.  All questions were answered to her satisfaction. she is encouraged to contact clinic should she have any questions or concerns prior to her return visit.    Follow up plan: Return in about 6 months (around 02/10/2024) for Fasting Labs  in AM B4 8.  Marquis Lunch, MD Phone: [phone]  Fax: [phone]  -  This note was partially dictated with voice recognition software. Similar sounding words can be transcribed inadequately or may not  be corrected upon review.  08/11/2023, 3:45 PM

## 2023-08-16 ENCOUNTER — Other Ambulatory Visit (HOSPITAL_COMMUNITY): Payer: PPO

## 2023-08-19 ENCOUNTER — Encounter: Payer: Self-pay | Admitting: Family Medicine

## 2023-08-19 ENCOUNTER — Ambulatory Visit (INDEPENDENT_AMBULATORY_CARE_PROVIDER_SITE_OTHER): Payer: PPO | Admitting: Family Medicine

## 2023-08-19 VITALS — BP 128/76 | HR 83 | Temp 98.3°F | Ht 65.0 in | Wt 214.0 lb

## 2023-08-19 DIAGNOSIS — J329 Chronic sinusitis, unspecified: Secondary | ICD-10-CM | POA: Diagnosis not present

## 2023-08-19 MED ORDER — TRIAMCINOLONE ACETONIDE 55 MCG/ACT NA AERO
2.0000 | INHALATION_SPRAY | Freq: Every day | NASAL | Status: DC
Start: 2023-08-19 — End: 2023-11-30

## 2023-08-19 MED ORDER — CEFDINIR 300 MG PO CAPS: 300.0000 mg | ORAL_CAPSULE | Freq: Two times a day (BID) | ORAL | 0 refills | Status: DC

## 2023-08-19 NOTE — Patient Instructions (Signed)
2 sprays in each nostril daily as needed for sinus pressure Omnicef sent for 10 days for infection Will let you know about the COVID test  Sinus Infection, Adult A sinus infection is soreness and swelling (inflammation) of your sinuses. Sinuses are hollow spaces in the bones around your face. They are located: Around your eyes. In the middle of your forehead. Behind your nose. In your cheekbones. Your sinuses and nasal passages are lined with a fluid called mucus. Mucus drains out of your sinuses. Swelling can trap mucus in your sinuses. This lets germs (bacteria, virus, or fungus) grow, which leads to infection. Most of the time, this condition is caused by a virus. What are the causes? Allergies. Asthma. Germs. Things that block your nose or sinuses. Growths in the nose (nasal polyps). Chemicals or irritants in the air. A fungus. This is rare. What increases the risk? Having a weak body defense system (immune system). Doing a lot of swimming or diving. Using nasal sprays too much. Smoking. What are the signs or symptoms? The main symptoms of this condition are pain and a feeling of pressure around the sinuses. Other symptoms include: Stuffy nose (congestion). This may make it hard to breathe through your nose. Runny nose (drainage). Soreness, swelling, and warmth in the sinuses. A cough that may get worse at night. Being unable to smell and taste. Mucus that collects in the throat or the back of the nose (postnasal drip). This may cause a sore throat or bad breath. Being very tired (fatigued). A fever. How is this diagnosed? Your symptoms. Your medical history. A physical exam. Tests to find out if your condition is short-term (acute) or long-term (chronic). Your doctor may: Check your nose for growths (polyps). Check your sinuses using a tool that has a light on one end (endoscope). Check for allergies or germs. Do imaging tests, such as an MRI or CT scan. How is this  treated? Treatment for this condition depends on the cause and whether it is short-term or long-term. If caused by a virus, your symptoms should go away on their own within 10 days. You may be given medicines to relieve symptoms. They include: Medicines that shrink swollen tissue in the nose. A spray that treats swelling of the nostrils. Rinses that help get rid of thick mucus in your nose (nasal saline washes). Medicines that treat allergies (antihistamines). Over-the-counter pain relievers. If caused by bacteria, your doctor may wait to see if you will get better without treatment. You may be given antibiotic medicine if you have: A very bad infection. A weak body defense system. If caused by growths in the nose, surgery may be needed. Follow these instructions at home: Medicines Take, use, or apply over-the-counter and prescription medicines only as told by your doctor. These may include nasal sprays. If you were prescribed an antibiotic medicine, take it as told by your doctor. Do not stop taking it even if you start to feel better. Hydrate and humidify  Drink enough water to keep your pee (urine) pale yellow. Use a cool mist humidifier to keep the humidity level in your home above 50%. Breathe in steam for 10-15 minutes, 3-4 times a day, or as told by your doctor. You can do this in the bathroom while a hot shower is running. Try not to spend time in cool or dry air. Rest Rest as much as you can. Sleep with your head raised (elevated). Make sure you get enough sleep each night. General instructions  Put a warm, moist washcloth on your face 3-4 times a day, or as often as told by your doctor. Use nasal saline washes as often as told by your doctor. Wash your hands often with soap and water. If you cannot use soap and water, use hand sanitizer. Do not smoke. Avoid being around people who are smoking (secondhand smoke). Keep all follow-up visits. Contact a doctor if: You have a  fever. Your symptoms get worse. Your symptoms do not get better within 10 days. Get help right away if: You have a very bad headache. You cannot stop vomiting. You have very bad pain or swelling around your face or eyes. You have trouble seeing. You feel confused. Your neck is stiff. You have trouble breathing. These symptoms may be an emergency. Get help right away. Call 911. Do not wait to see if the symptoms will go away. Do not drive yourself to the hospital. Summary A sinus infection is swelling of your sinuses. Sinuses are hollow spaces in the bones around your face. This condition is caused by tissues in your nose that become inflamed or swollen. This traps germs. These can lead to infection. If you were prescribed an antibiotic medicine, take it as told by your doctor. Do not stop taking it even if you start to feel better. Keep all follow-up visits. This information is not intended to replace advice given to you by your health care provider. Make sure you discuss any questions you have with your health care provider. Document Revised: 11/03/2021 Document Reviewed: 11/03/2021 Elsevier Patient Education  2024 [employer].

## 2023-08-19 NOTE — Progress Notes (Addendum)
Subjective: CC: URI PCP: [name], FNP WUJ:[id number] Andrea Martinez is a 77 y.o. female presenting to clinic today for:  1.  URI Patient reports onset on Friday.  She started having drainage, ear pressure.  She cannot even hear in the right ear.  No measured fevers.  She reports that drainage is so copious that she started feeling nauseated but has not vomited.  No diarrhea.  Not using any nasal sprays.  No known sick contacts.  She had stopped using Zyrtec and started using some type of over-the-counter sinus medication that her husband brought her.  She took a COVID test on Tuesday and that was negative.  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 [name]   Lipoma    left upper arm   Mixed hyperlipidemia    Osteoporosis    Pericardial effusion    a. HCAP complicated by pericardial effusion requiring pericardial window 10/2016 and large L pleural effusion requring VATS.   Pleural effusion    a. s/Andrea VATS 2017.   Pneumonia 10/2016   PONV (postoperative nausea and vomiting)    history of   Prolonged QT interval    Urine incontinence    UTI (lower urinary tract infection)    frequent   Vitamin D deficiency     Current Outpatient Medications:    cetirizine (ZYRTEC) 10 MG tablet, Take 10 mg by mouth daily., Disp:  , Rfl:    citalopram (CELEXA) 20 MG tablet, TAKE 1 TABLET BY MOUTH EVERY DAY, Disp: 90 tablet, Rfl: 1   HYDROcodone-acetaminophen (NORCO) 10-325 MG tablet, Take by mouth., Disp: , Rfl:    lactose free nutrition (BOOST) LIQD, Take 237 mLs by mouth daily., Disp: , Rfl:    levothyroxine (SYNTHROID) 88 MCG tablet, TAKE 1 TABLET BY MOUTH EVERY DAY BEFORE BREAKFAST, Disp: 90 tablet, Rfl: 1   nilotinib (TASIGNA) 150 MG capsule, Take 2 capsules (300 mg total) by mouth every 12 (twelve) hours., Disp: 120 capsule, Rfl: 12   Probiotic Product (DIGESTIVE ADV PREBIOT+PROBIOT) CHEW, Chew 2 each by mouth daily., Disp: , Rfl:    rosuvastatin (CRESTOR) 5 MG tablet, Take 1 tablet (5 mg total) by mouth at bedtime., Disp: 90 tablet, Rfl: 1 Social History   Socioeconomic History   Marital status: Married    Spouse name: Greggory Stallion   Number of children: 2   Years of education: 12th grade   Highest education level: Not on file  Occupational History   Occupation: retired    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   2 granddaughters.   Retired HR.    Social Determinants of Health   Financial Resource Strain: Low Risk  (05/23/2023)   Overall Financial Resource Strain (CARDIA)    Difficulty of Paying Living Expenses: Not hard at all  Food Insecurity: No Food Insecurity (06/29/2023)   Hunger Vital Sign    Worried About Running Out of Food in the Last Year: Never true    Ran Out of Food in the Last Year: Never true  Transportation Needs: No Transportation Needs (06/29/2023)   PRAPARE - Therapist, art (Medical): No    Lack of Transportation (Non-Medical): No  Physical Activity: Insufficiently Active (05/23/2023)   Exercise Vital Sign    Days of Exercise per Week: 3 days    Minutes of Exercise per Session: 30 min  Stress: No Stress Concern Present (05/23/2023)   [employer] of Occupational Health - Occupational Stress Questionnaire    Feeling of Stress : Not at all  Social Connections: Moderately Integrated (05/23/2023)   Social Connection and Isolation Panel [NHANES]    Frequency of Communication with Friends and Family: More than three times a week    Frequency of Social Gatherings with Friends and Family: More than three times a week    Attends Religious Services: More than 4 times per year    Active Member of [employer] or Organizations: No    Attends Banker Meetings: Never    Marital Status: Married  Catering manager Violence: Not At Risk (06/29/2023)   Humiliation, Afraid, Rape, and Kick questionnaire    Fear of Current or Ex-Partner: No    Emotionally Abused: No    Physically Abused: No    Sexually Abused: No   Family History  Problem Relation Age of Onset   Lung cancer Father    COPD Mother    Stroke Mother    Thyroid disease Mother    Colon cancer Neg Hx    Anesthesia problems Neg Hx    Hypotension Neg Hx    Malignant hyperthermia Neg Hx    Pseudochol deficiency Neg Hx     Objective: Office vital signs reviewed. BP 128/76   Pulse 83   Temp 98.3 F (36.8 C)   Ht 5\' 5"  (1.651 m)   Wt 214 lb (97.1 kg)   SpO2 97%   BMI 35.61 kg/m   Physical Examination:  General: Awake, alert, tired appearing, No acute distress HEENT: Normal    Neck: No masses palpated. No lymphadenopathy    Ears: Tympanic membranes intact, dulled light reflex, no erythema, no bulging    Eyes: extraocular membranes intact, sclera white    Nose: nasal turbinates moist, no nasal discharge    Throat: moist mucus membranes, moderate oropharyngeal erythema,  no tonsillar exudate.  Airway is patent Cardio: regular rate and rhythm, S1S2 heard, no murmurs appreciated Pulm: clear to auscultation bilaterally, no wheezes, rhonchi or rales; normal work of breathing on room air    Assessment/ Plan: 77 y.o. female   Rhinosinusitis - Plan: Novel Coronavirus, NAA ([facility]), cefdinir (OMNICEF) 300 MG capsule, triamcinolone (NASACORT) 55 MCG/ACT AERO nasal inhaler  COVID PCR  sent.  Will empirically treat for acute bacterial sinusitis given immunosuppression in the setting of leukemia.  Omnicef sent.  Nasacort given as a sample.  Home care instructions reviewed and reasons for reevaluation discussed   Raliegh Ip, DO [facility] [phone]

## 2023-08-20 LAB — NOVEL CORONAVIRUS, NAA: SARS-CoV-2, NAA: NOT DETECTED

## 2023-08-30 ENCOUNTER — Encounter: Payer: Self-pay | Admitting: Family Medicine

## 2023-08-30 ENCOUNTER — Ambulatory Visit (INDEPENDENT_AMBULATORY_CARE_PROVIDER_SITE_OTHER): Payer: PPO | Admitting: Family Medicine

## 2023-08-30 VITALS — BP 142/71 | HR 83 | Temp 95.6°F | Ht 65.0 in | Wt 214.6 lb

## 2023-08-30 DIAGNOSIS — J069 Acute upper respiratory infection, unspecified: Secondary | ICD-10-CM | POA: Diagnosis not present

## 2023-08-30 DIAGNOSIS — H6593 Unspecified nonsuppurative otitis media, bilateral: Secondary | ICD-10-CM

## 2023-08-30 MED ORDER — AMOXICILLIN-POT CLAVULANATE 875-125 MG PO TABS
1.0000 | ORAL_TABLET | Freq: Two times a day (BID) | ORAL | 0 refills | Status: AC
Start: 2023-08-30 — End: 2023-09-09

## 2023-08-30 MED ORDER — METHYLPREDNISOLONE ACETATE 40 MG/ML IJ SUSP
40.0000 mg | Freq: Once | INTRAMUSCULAR | Status: AC
Start: 2023-08-30 — End: 2023-08-30
  Administered 2023-08-30: 40 mg via INTRAMUSCULAR

## 2023-08-30 MED ORDER — FLUTICASONE PROPIONATE 50 MCG/ACT NA SUSP: 2.0000 | Freq: Every day | NASAL | 6 refills | Status: DC

## 2023-08-30 NOTE — Progress Notes (Signed)
Subjective:  Patient ID: Andrea Martinez, female    DOB: 24-May-1946, 77 y.o.   MRN: [id number]  Patient Care Team: [name], FNP as PCP - General (Family Medicine) [name], MD as Medical Oncologist (Medical Oncology) [name], MD as Consulting Physician (Endocrinology) [name], MD as Consulting Physician (Orthopedic Surgery)   Chief Complaint:  facial pressure (Seen 9/6 and states she is no better - ear pain, teeth &facial pain , headache, cough, nasal congestion )   HPI: Andrea Martinez is a 77 y.o. female presenting on 08/30/2023 for facial pressure (Seen 9/6 and states she is no better - ear pain, teeth &facial pain , headache, cough, nasal congestion )   URI  This is a recurrent problem. The current episode started 1 to 4 weeks ago. The problem has been gradually worsening. Associated symptoms include congestion, coughing, ear pain, headaches, a plugged ear sensation, rhinorrhea, sinus pain and a sore throat. Pertinent negatives include no abdominal pain, chest pain, diarrhea, dysuria, joint pain, joint swelling, nausea, neck pain, rash, sneezing, swollen glands, vomiting or wheezing. She has tried decongestant and antihistamine Truman Hayward - 08/19/2023) for the symptoms. The treatment provided no relief.     Relevant past medical, surgical, family, and social history reviewed and updated as indicated.  Allergies and medications reviewed and updated. Data reviewed: Chart in Epic.   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 [name]   Lipoma    left upper arm   Mixed hyperlipidemia    Osteoporosis    Pericardial effusion    a. HCAP complicated by pericardial effusion requiring pericardial window 10/2016 and large L pleural  effusion requring VATS.   Pleural effusion    a. s/p VATS 2017.   Pneumonia 10/2016   PONV (postoperative nausea and vomiting)    history of   Prolonged QT interval    Urine incontinence    UTI (lower urinary tract infection)    frequent   Vitamin D deficiency     Past Surgical History:  Procedure Laterality Date   BIOPSY  03/20/2022   Procedure: BIOPSY;  Surgeon: Lanelle Bal, DO;  [facility];  Service: Endoscopy;;  gastric lesion vs. gastric ulcer   BIOPSY  06/28/2022   Procedure: BIOPSY;  Surgeon: Lanelle Bal, DO;  [facility];  Service: Endoscopy;;   BIOPSY  05/10/2023   Procedure: BIOPSY;  Surgeon: Lanelle Bal, DO;  [facility]: [facility];  Service: Endoscopy;;   BRAVO PH STUDY  12/04/2012   Procedure: BRAVO PH STUDY;  Surgeon: West Bali, MD;  [facility]: [facility];  Service: Endoscopy;  Laterality: N/A;   CHOLECYSTECTOMY N/A 01/03/2014   Procedure: LAPAROSCOPIC CHOLECYSTECTOMY WITH INTRAOPERATIVE CHOLANGIOGRAM;  Surgeon: Clovis Pu. Cornett, MD;  Location: [facility];  Service: General;  Laterality: N/A;   COLONOSCOPY  01/2004   [facility], few small tics   COLONOSCOPY WITH PROPOFOL N/A 05/10/2023   Procedure: COLONOSCOPY WITH PROPOFOL;  Surgeon: Lanelle Bal, DO;  [facility];  Service: Endoscopy;  Laterality: N/A;  1245pm, asa 3   ESOPHAGOGASTRODUODENOSCOPY  02/2010   gastric ulcers   ESOPHAGOGASTRODUODENOSCOPY  12/04/2012  [name] gastritis (inflammation) was found in the gastric antrum; multiple biopsies The duodenal mucosa showed no abnormalities in the bulb and second portion of the duodenum   ESOPHAGOGASTRODUODENOSCOPY (EGD) WITH PROPOFOL N/A 03/20/2022   Procedure: ESOPHAGOGASTRODUODENOSCOPY (EGD) WITH PROPOFOL;  Surgeon: Lanelle Bal, DO;  [facility];  Service: Endoscopy;  Laterality: N/A;   ESOPHAGOGASTRODUODENOSCOPY (EGD) WITH PROPOFOL N/A 06/28/2022   Procedure:  ESOPHAGOGASTRODUODENOSCOPY (EGD) WITH PROPOFOL;  Surgeon: Lanelle Bal, DO;  [facility];  Service: Endoscopy;  Laterality: N/A;  800   ESOPHAGOGASTRODUODENOSCOPY (EGD) WITH PROPOFOL N/A 05/10/2023   Procedure: ESOPHAGOGASTRODUODENOSCOPY (EGD) WITH PROPOFOL;  Surgeon: Lanelle Bal, DO;  [facility];  Service: Endoscopy;  Laterality: N/A;   JOINT REPLACEMENT Right 10/22/2019   KYPHOPLASTY N/A 02/14/2019   Procedure: KYPHOPLASTY T12 and L1;  Surgeon: [name], MD;  Location: [facility];  Service: Orthopedics;  Laterality: N/A;  120 mins   LASIK     LIPOMA EXCISION  08/02/2011   left shoulder   NOSE SURGERY     PARTIAL HYSTERECTOMY     vaginal at age 66 years of age   POLYPECTOMY  05/10/2023   Procedure: POLYPECTOMY;  Surgeon: Lanelle Bal, DO;  [facility]: [facility];  Service: Endoscopy;;   TOE DEBRIDEMENT Left 1962   lt great toe   TOTAL HIP ARTHROPLASTY Right 11/18/2021   Procedure: TOTAL HIP ARTHROPLASTY ANTERIOR APPROACH;  Surgeon: Ollen Gross, MD;  Location: [facility];  Service: Orthopedics;  Laterality: Right;   TOTAL HIP ARTHROPLASTY Left 06/29/2023   Procedure: LEFT TOTAL HIP ARTHROPLASTY ANTERIOR APPROACH;  Surgeon: Ollen Gross, MD;  Location: [facility];  Service: Orthopedics;  Laterality: Left;   TOTAL KNEE ARTHROPLASTY Right 10/22/2019   Procedure: TOTAL KNEE ARTHROPLASTY;  Surgeon: Ollen Gross, MD;  Location: [facility];  Service: Orthopedics;  Laterality: Right;    TOTAL KNEE ARTHROPLASTY Left 03/03/2020   Procedure: TOTAL KNEE ARTHROPLASTY;  Surgeon: Ollen Gross, MD;  Location: [facility];  Service: Orthopedics;  Laterality: Left;    TRANSFORAMINAL LUMBAR INTERBODY FUSION (TLIF) WITH PEDICLE SCREW FIXATION 1 LEVEL N/A 01/28/2021   Procedure: TRANSFORAMINAL LUMBAR INTERBODY FUSION (TLIF) LUMBAR FIVE-SACRAL ONE;  Surgeon: [name], MD;  Location: [facility];  Service: Orthopedics;  Laterality: N/A;  4 hrs   VIDEO ASSISTED  THORACOSCOPY (VATS)/EMPYEMA Left 10/25/2016   Procedure: VIDEO ASSISTED THORACOSCOPY (VATS), BRONCH,DRAINAGE OF PLEURAL EFFUSION,PERICARDIAL WINDOW WITH DRAINAGE OF PERICARDIAL FLUID, TEE;  Surgeon: Loreli Slot, MD;  Location: [facility];  Service: Thoracic;  Laterality: Left;   VIDEO BRONCHOSCOPY N/A 10/25/2016   Procedure: VIDEO BRONCHOSCOPY;  Surgeon: Loreli Slot, MD;  Location: [facility];  Service: Thoracic;  Laterality: N/A;    Social History   Socioeconomic History   Marital status: Married    Spouse name: Greggory Stallion   Number of children: 2   Years of education: 12th grade   Highest education level: Not on file  Occupational History   Occupation: retired    Associate Professor: UENPLOYED    Employer: RETIRED  Tobacco Use   Smoking status: Never    Passive exposure: Never   Smokeless tobacco: Never  Vaping Use   Vaping status: Never Used  Substance and Sexual Activity   Alcohol use: No    Alcohol/week: 0.0 standard drinks of alcohol   Drug use: No   Sexual activity: Yes    Partners: Male    Birth control/protection: None  Other Topics Concern   Not on  file  Social History Narrative   1 son, 1 daughter.   2 granddaughters.   Retired HR.    Social Determinants of Health   Financial Resource Strain: Low Risk  (05/23/2023)   Overall Financial Resource Strain (CARDIA)    Difficulty of Paying Living Expenses: Not hard at all  Food Insecurity: No Food Insecurity (06/29/2023)   Hunger Vital Sign    Worried About Running Out of Food in the Last Year: Never true    Ran Out of Food in the Last Year: Never true  Transportation Needs: No Transportation Needs (06/29/2023)   PRAPARE - Administrator, Civil Service (Medical): No    Lack of Transportation (Non-Medical): No  Physical Activity: Insufficiently Active (05/23/2023)   Exercise Vital Sign    Days of Exercise per Week: 3 days    Minutes of Exercise per Session: 30 min  Stress: No Stress Concern Present (05/23/2023)    [employer] of Occupational Health - Occupational Stress Questionnaire    Feeling of Stress : Not at all  Social Connections: Moderately Integrated (05/23/2023)   Social Connection and Isolation Panel [NHANES]    Frequency of Communication with Friends and Family: More than three times a week    Frequency of Social Gatherings with Friends and Family: More than three times a week    Attends Religious Services: More than 4 times per year    Active Member of [employer] or Organizations: No    Attends Banker Meetings: Never    Marital Status: Married  Catering manager Violence: Not At Risk (06/29/2023)   Humiliation, Afraid, Rape, and Kick questionnaire    Fear of Current or Ex-Partner: No    Emotionally Abused: No    Physically Abused: No    Sexually Abused: No    Outpatient Encounter Medications as of 08/30/2023  Medication Sig   amoxicillin-clavulanate (AUGMENTIN) 875-125 MG tablet Take 1 tablet by mouth 2 (two) times daily for 10 days.   cetirizine (ZYRTEC) 10 MG tablet Take 10 mg by mouth daily.   citalopram (CELEXA) 20 MG tablet TAKE 1 TABLET BY MOUTH EVERY DAY   fluticasone (FLONASE) 50 MCG/ACT nasal spray Place 2 sprays into both nostrils daily.   HYDROcodone-acetaminophen (NORCO) 10-325 MG tablet Take by mouth.   lactose free nutrition (BOOST) LIQD Take 237 mLs by mouth daily.   levothyroxine (SYNTHROID) 88 MCG tablet TAKE 1 TABLET BY MOUTH EVERY DAY BEFORE BREAKFAST   nilotinib (TASIGNA) 150 MG capsule Take 2 capsules (300 mg total) by mouth every 12 (twelve) hours.   Probiotic Product (DIGESTIVE ADV PREBIOT+PROBIOT) CHEW Chew 2 each by mouth daily.   rosuvastatin (CRESTOR) 5 MG tablet Take 1 tablet (5 mg total) by mouth at bedtime.   triamcinolone (NASACORT) 55 MCG/ACT AERO nasal inhaler Place 2 sprays into the nose daily.   [DISCONTINUED] cefdinir (OMNICEF) 300 MG capsule Take 1 capsule (300 mg total) by mouth 2 (two) times daily. 1 po BID   No  facility-administered encounter medications on file as of 08/30/2023.    Allergies  Allergen Reactions   Nucynta [Tapentadol Hcl] Swelling   Codeine Nausea Only and Other (See Comments)    Headache   Sulfonamide Derivatives Nausea Only and Other (See Comments)    Headache   Nexium [Esomeprazole Magnesium] Nausea Only   Sulfa Antibiotics Nausea Only    Headache    Fluconazole Rash    Redness and blistering on left thigh    Review of Systems  Constitutional:  Positive for activity change, appetite change, chills and fatigue. Negative for diaphoresis, fever and unexpected weight change.  HENT:  Positive for congestion, ear pain, postnasal drip, rhinorrhea, sinus pressure, sinus pain and sore throat. Negative for dental problem, drooling, ear discharge, facial swelling, hearing loss, mouth sores, nosebleeds, sneezing, tinnitus, trouble swallowing and voice change.   Eyes:  Negative for photophobia and visual disturbance.  Respiratory:  Positive for cough. Negative for apnea, choking, chest tightness, shortness of breath, wheezing and stridor.   Cardiovascular:  Negative for chest pain, palpitations and leg swelling.  Gastrointestinal:  Negative for abdominal pain, diarrhea, nausea and vomiting.  Endocrine: Negative for polydipsia, polyphagia and polyuria.  Genitourinary:  Negative for decreased urine volume and dysuria.  Musculoskeletal:  Negative for arthralgias (recent hip replacement, doing well), back pain, gait problem, joint pain, joint swelling, myalgias, neck pain and neck stiffness.  Skin:  Negative for rash.  Neurological:  Positive for headaches. Negative for dizziness, tremors, seizures, syncope, facial asymmetry, speech difficulty, weakness, light-headedness and numbness.  Psychiatric/Behavioral:  Negative for confusion.   All other systems reviewed and are negative.       Objective:  BP (!) 142/71   Pulse 83   Temp (!) 95.6 F (35.3 C) (Temporal)   Ht 5\' 5"  (1.651 m)    Wt 214 lb 9.6 oz (97.3 kg)   SpO2 94%   BMI 35.71 kg/m    Wt Readings from Last 3 Encounters:  08/30/23 214 lb 9.6 oz (97.3 kg)  08/19/23 214 lb (97.1 kg)  08/11/23 219 lb 12.8 oz (99.7 kg)    Physical Exam Vitals and nursing note reviewed.  Constitutional:      General: She is not in acute distress.    Appearance: Normal appearance. She is obese. She is not ill-appearing, toxic-appearing or diaphoretic.  HENT:     Head: Normocephalic and atraumatic.     Right Ear: A middle ear effusion is present. Tympanic membrane is not erythematous.     Left Ear: A middle ear effusion is present. Tympanic membrane is not erythematous.     Nose: Congestion and rhinorrhea present.     Right Turbinates: Enlarged.     Left Turbinates: Enlarged.     Right Sinus: Maxillary sinus tenderness and frontal sinus tenderness present.     Left Sinus: Maxillary sinus tenderness and frontal sinus tenderness present.     Mouth/Throat:     Lips: Pink.     Mouth: Mucous membranes are moist.     Pharynx: Posterior oropharyngeal erythema and postnasal drip present. No pharyngeal swelling, oropharyngeal exudate or uvula swelling.  Eyes:     Conjunctiva/sclera: Conjunctivae normal.     Pupils: Pupils are equal, round, and reactive to light.  Cardiovascular:     Rate and Rhythm: Normal rate and regular rhythm.     Heart sounds: Normal heart sounds.  Pulmonary:     Effort: Pulmonary effort is normal.     Breath sounds: Normal breath sounds.  Abdominal:     General: Bowel sounds are normal.     Palpations: Abdomen is soft.  Musculoskeletal:     Cervical back: Normal range of motion and neck supple.  Lymphadenopathy:     Cervical: Cervical adenopathy present.  Skin:    General: Skin is warm and dry.     Capillary Refill: Capillary refill takes less than 2 seconds.  Neurological:     General: No focal deficit present.     Mental Status: She  is alert and oriented to person, place, and time.     Gait: Gait  abnormal (using rolling walker).  Psychiatric:        Mood and Affect: Mood normal.        Behavior: Behavior normal.        Thought Content: Thought content normal.        Judgment: Judgment normal.     Results for orders placed or performed in visit on 08/19/23  Novel Coronavirus, NAA ([facility])   Specimen: Nasopharyngeal(NP) swabs in vial transport medium  Result Value Ref Range   SARS-CoV-2, NAA Not Detected Not Detected       Pertinent labs & imaging results that were available during my care of the patient were reviewed by me and considered in my medical decision making.  Assessment & Plan:  Andrea Martinez "Paylin Heavin" was seen today for facial pressure.  Diagnoses and all orders for this visit:  URI with cough and congestion Ongoing symptoms despite treatment with Omnicef. Burst with steroids in office to help with congestion and TM fullness. Will start Flonase and Augmentin as prescribed. Continue Zyrtec and Tylenol as needed. Report new, worsening, or persistent symptoms.  -     fluticasone (FLONASE) 50 MCG/ACT nasal spray; Place 2 sprays into both nostrils daily. -     amoxicillin-clavulanate (AUGMENTIN) 875-125 MG tablet; Take 1 tablet by mouth 2 (two) times daily for 10 days.  Fluid level behind tympanic membrane of both ears -     fluticasone (FLONASE) 50 MCG/ACT nasal spray; Place 2 sprays into both nostrils daily.     Continue all other maintenance medications.  Follow up plan: Return if symptoms worsen or fail to improve.   Continue healthy lifestyle choices, including diet (rich in fruits, vegetables, and lean proteins, and low in salt and simple carbohydrates) and exercise (at least 30 minutes of moderate physical activity daily).   The above assessment and management plan was discussed with the patient. The patient verbalized understanding of and has agreed to the management plan. Patient is aware to call the clinic if they develop any new symptoms or if symptoms  persist or worsen. Patient is aware when to return to the clinic for a follow-up visit. Patient educated on when it is appropriate to go to the emergency department.   Kari Baars, FNP-C [facility] [phone]

## 2023-09-19 ENCOUNTER — Other Ambulatory Visit: Payer: Self-pay | Admitting: *Deleted

## 2023-09-19 ENCOUNTER — Telehealth: Payer: Self-pay | Admitting: *Deleted

## 2023-09-19 DIAGNOSIS — D509 Iron deficiency anemia, unspecified: Secondary | ICD-10-CM

## 2023-09-19 DIAGNOSIS — C9210 Chronic myeloid leukemia, BCR/ABL-positive, not having achieved remission: Secondary | ICD-10-CM

## 2023-09-19 NOTE — Telephone Encounter (Signed)
Patient called c/o profound weakness and shortness of breath.  States she has had an infection for several weeks and has been on 2 antibiotics prescribed by her PCP.  She denied advice to go to the emergency room.  States that she will go this evening if she begins to feel worse.  Insistent that she have an appointment with Dr. [name].  She does have a history of IDA and has required blood transfusions in the past.  Will obtain labs in the morning.  Appointment made for her to see Dr. [name] on Thursday, however if labs are normal will discuss other recommendations with him prior to appointment.

## 2023-09-20 ENCOUNTER — Inpatient Hospital Stay: Payer: PPO | Attending: Hematology

## 2023-09-20 DIAGNOSIS — D649 Anemia, unspecified: Secondary | ICD-10-CM | POA: Insufficient documentation

## 2023-09-20 DIAGNOSIS — C9210 Chronic myeloid leukemia, BCR/ABL-positive, not having achieved remission: Secondary | ICD-10-CM

## 2023-09-20 DIAGNOSIS — D509 Iron deficiency anemia, unspecified: Secondary | ICD-10-CM

## 2023-09-20 LAB — CBC WITH DIFFERENTIAL/PLATELET
Abs Immature Granulocytes: 0.1 10*3/uL — ABNORMAL HIGH (ref 0.00–0.07)
Basophils Absolute: 0 10*3/uL (ref 0.0–0.1)
Basophils Relative: 0 %
Eosinophils Absolute: 0.2 10*3/uL (ref 0.0–0.5)
Eosinophils Relative: 1 %
HCT: 40.2 % (ref 36.0–46.0)
Hemoglobin: 12.1 g/dL (ref 12.0–15.0)
Immature Granulocytes: 1 %
Lymphocytes Relative: 13 %
Lymphs Abs: 2 10*3/uL (ref 0.7–4.0)
MCH: 26.9 pg (ref 26.0–34.0)
MCHC: 30.1 g/dL (ref 30.0–36.0)
MCV: 89.3 fL (ref 80.0–100.0)
Monocytes Absolute: 0.9 10*3/uL (ref 0.1–1.0)
Monocytes Relative: 6 %
Neutro Abs: 11.8 10*3/uL — ABNORMAL HIGH (ref 1.7–7.7)
Neutrophils Relative %: 79 %
Platelets: 268 10*3/uL (ref 150–400)
RBC: 4.5 MIL/uL (ref 3.87–5.11)
RDW: 13.5 % (ref 11.5–15.5)
WBC: 15 10*3/uL — ABNORMAL HIGH (ref 4.0–10.5)
nRBC: 0 % (ref 0.0–0.2)

## 2023-09-20 LAB — IRON AND TIBC
Iron: 46 ug/dL (ref 28–170)
Saturation Ratios: 16 % (ref 10.4–31.8)
TIBC: 296 ug/dL (ref 250–450)
UIBC: 250 ug/dL

## 2023-09-20 LAB — SAMPLE TO BLOOD BANK

## 2023-09-20 LAB — FERRITIN: Ferritin: 322 ng/mL — ABNORMAL HIGH (ref 11–307)

## 2023-09-21 ENCOUNTER — Ambulatory Visit (INDEPENDENT_AMBULATORY_CARE_PROVIDER_SITE_OTHER): Payer: PPO

## 2023-09-21 ENCOUNTER — Encounter: Payer: Self-pay | Admitting: Family Medicine

## 2023-09-21 ENCOUNTER — Ambulatory Visit (INDEPENDENT_AMBULATORY_CARE_PROVIDER_SITE_OTHER): Payer: PPO | Admitting: Family Medicine

## 2023-09-21 VITALS — BP 123/69 | HR 93 | Temp 94.9°F | Ht 65.0 in | Wt 209.0 lb

## 2023-09-21 DIAGNOSIS — R7989 Other specified abnormal findings of blood chemistry: Secondary | ICD-10-CM | POA: Diagnosis not present

## 2023-09-21 DIAGNOSIS — R531 Weakness: Secondary | ICD-10-CM

## 2023-09-21 DIAGNOSIS — J984 Other disorders of lung: Secondary | ICD-10-CM | POA: Diagnosis not present

## 2023-09-21 DIAGNOSIS — R5383 Other fatigue: Secondary | ICD-10-CM

## 2023-09-21 DIAGNOSIS — E559 Vitamin D deficiency, unspecified: Secondary | ICD-10-CM | POA: Diagnosis not present

## 2023-09-21 DIAGNOSIS — R5381 Other malaise: Secondary | ICD-10-CM | POA: Diagnosis not present

## 2023-09-21 DIAGNOSIS — R0602 Shortness of breath: Secondary | ICD-10-CM

## 2023-09-21 NOTE — Progress Notes (Signed)
Subjective:  Patient ID: Andrea Martinez, female    DOB: [birth year], 77 y.o.   MRN: [id number]  Patient Care Team: [name], FNP as PCP - General (Family Medicine) [name], MD as Medical Oncologist (Medical Oncology) [name], MD as Consulting Physician (Endocrinology) [name], MD as Consulting Physician (Orthopedic Surgery)   Chief Complaint:  Weakness and Shortness of Breath (Since she was seen on 9/6 /)   HPI: Andrea Martinez is a 77 y.o. female presenting on 09/21/2023 for Weakness and Shortness of Breath (Since she was seen on 9/6 /)   Discussed the use of AI scribe software for clinical note transcription with the patient, who gave verbal consent to proceed.  History of Present Illness   The patient, known to have a history of cancer and currently on chemotherapy (Tasigna), presents with a chief complaint of generalized weakness and intermittent shortness of breath. She describes the weakness as feeling "worn out" and "tired," which is not constant but occurs in episodes. The patient notes that the weakness seems to improve slightly with walking around. She denies any associated anxiety or nervousness during these episodes.  The patient also reports episodes of excessive sweating, which she believes contributes to her weakness by causing fluid loss. She denies any chest pain or palpitations. She also reports occasional headaches, which she attributes to episodes of shortness of breath.  The patient has recently been treated with antibiotics for an URI, which she believes has not fully resolved and may be contributing to her current symptoms. She also reports a recent decrease in hemoglobin to 10, which has since improved to 12.  The patient denies any changes in her medication regimen, which includes a multivitamin. She also reports a history of iron infusions and B12 injections, which she believes helped with her weakness in the past.  The  patient's daughter has suggested a cardiac evaluation, but the patient does not believe her symptoms are cardiac in nature. She reports a history of cardiac evaluation several years ago, which was unremarkable.   The patient's goal is to feel well enough to celebrate her 59th wedding anniversary next year. She expresses concern about the impact of her health issues on her spouse, who has been her primary caregiver.          Relevant past medical, surgical, family, and social history reviewed and updated as indicated.  Allergies and medications reviewed and updated. Data reviewed: Chart in Epic.   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 [name]   Lipoma    left upper arm   Mixed hyperlipidemia    Osteoporosis    Pericardial effusion    a. HCAP complicated by pericardial effusion requiring pericardial window 10/2016 and large L pleural effusion requring VATS.   Pleural effusion    a. s/p VATS 2017.   Pneumonia 10/2016   PONV (postoperative nausea and vomiting)    history of   Prolonged QT interval    Urine incontinence    UTI (lower urinary tract infection)    frequent   Vitamin D deficiency     Past Surgical History:  Procedure Laterality Date   BIOPSY  03/20/2022   Procedure: BIOPSY;  Surgeon: Lanelle Bal, DO;  [facility];  Service: Endoscopy;;  gastric lesion vs. gastric ulcer   BIOPSY  06/28/2022   Procedure: BIOPSY;  Surgeon: Lanelle Bal, DO;  [facility];  Service: Endoscopy;;   BIOPSY  05/10/2023   Procedure: BIOPSY;  Surgeon: Lanelle Bal, DO;  [facility];  Service: Endoscopy;;   BRAVO PH STUDY  12/04/2012   Procedure: BRAVO PH STUDY;  Surgeon: West Bali, MD;  [facility];   Service: Endoscopy;  Laterality: N/A;   CHOLECYSTECTOMY N/A 01/03/2014   Procedure: LAPAROSCOPIC CHOLECYSTECTOMY WITH INTRAOPERATIVE CHOLANGIOGRAM;  Surgeon: Clovis Pu. Cornett, MD;  Location: [facility];  Service: General;  Laterality: N/A;   COLONOSCOPY  01/2004   DR [facility], few small tics   COLONOSCOPY WITH PROPOFOL N/A 05/10/2023   Procedure: COLONOSCOPY WITH PROPOFOL;  Surgeon: Lanelle Bal, DO;  [facility];  Service: Endoscopy;  Laterality: N/A;  1245pm, asa 3   ESOPHAGOGASTRODUODENOSCOPY  02/2010   gastric ulcers   ESOPHAGOGASTRODUODENOSCOPY  12/04/2012   [id number] gastritis (inflammation) was found in the gastric antrum; multiple biopsies The duodenal mucosa showed no abnormalities in the bulb and second portion of the duodenum   ESOPHAGOGASTRODUODENOSCOPY (EGD) WITH PROPOFOL N/A 03/20/2022   Procedure: ESOPHAGOGASTRODUODENOSCOPY (EGD) WITH PROPOFOL;  Surgeon: Lanelle Bal, DO;  [facility];  Service: Endoscopy;  Laterality: N/A;   ESOPHAGOGASTRODUODENOSCOPY (EGD) WITH PROPOFOL N/A 06/28/2022   Procedure: ESOPHAGOGASTRODUODENOSCOPY (EGD) WITH PROPOFOL;  Surgeon: Lanelle Bal, DO;  [facility];  Service: Endoscopy;  Laterality: N/A;  800   ESOPHAGOGASTRODUODENOSCOPY (EGD) WITH PROPOFOL N/A 05/10/2023   Procedure: ESOPHAGOGASTRODUODENOSCOPY (EGD) WITH PROPOFOL;  Surgeon: Lanelle Bal, DO;  [facility];  Service: Endoscopy;  Laterality: N/A;   JOINT REPLACEMENT Right 10/22/2019   KYPHOPLASTY N/A 02/14/2019   Procedure: KYPHOPLASTY T12 and L1;  Surgeon: [name], MD;  [facility];  Service: Orthopedics;  Laterality: N/A;  120 mins   LASIK     LIPOMA EXCISION  08/02/2011   left shoulder   NOSE SURGERY     PARTIAL HYSTERECTOMY     vaginal at age 66 years of age   POLYPECTOMY  05/10/2023   Procedure: POLYPECTOMY;  Surgeon: Lanelle Bal, DO;  [facility];  Service: Endoscopy;;    TOE DEBRIDEMENT Left 1962   lt great toe   TOTAL HIP ARTHROPLASTY Right 11/18/2021   Procedure: TOTAL HIP ARTHROPLASTY ANTERIOR APPROACH;  Surgeon: Ollen Gross, MD;  Location: [facility];  Service: Orthopedics;  Laterality: Right;   TOTAL HIP ARTHROPLASTY Left 06/29/2023   Procedure: LEFT TOTAL HIP ARTHROPLASTY ANTERIOR APPROACH;  Surgeon: Ollen Gross, MD;  Location: [facility];  Service: Orthopedics;  Laterality: Left;   TOTAL KNEE ARTHROPLASTY Right 10/22/2019   Procedure: TOTAL KNEE ARTHROPLASTY;  Surgeon: Ollen Gross, MD;  [facility]: [facility];  Service: Orthopedics;  Laterality: Right;    TOTAL KNEE ARTHROPLASTY Left 03/03/2020   Procedure: TOTAL KNEE ARTHROPLASTY;  Surgeon: Ollen Gross, MD;  Location: [facility];  Service: Orthopedics;  Laterality: Left;    TRANSFORAMINAL LUMBAR INTERBODY FUSION (TLIF) WITH PEDICLE SCREW FIXATION 1 LEVEL N/A 01/28/2021   Procedure: TRANSFORAMINAL LUMBAR INTERBODY FUSION (TLIF) LUMBAR FIVE-SACRAL ONE;  Surgeon: [name], MD;  [facility];  Service: Orthopedics;  Laterality: N/A;  4 hrs   VIDEO ASSISTED THORACOSCOPY (VATS)/EMPYEMA Left 10/25/2016   Procedure: VIDEO ASSISTED THORACOSCOPY (VATS),  BRONCH,DRAINAGE OF PLEURAL EFFUSION,PERICARDIAL WINDOW WITH DRAINAGE OF PERICARDIAL FLUID, TEE;  Surgeon: Loreli Slot, MD;  Location: [facility];  Service: Thoracic;  Laterality: Left;   VIDEO BRONCHOSCOPY N/A 10/25/2016   Procedure: VIDEO BRONCHOSCOPY;  Surgeon: Loreli Slot, MD;  Location: [facility];  Service: Thoracic;  Laterality: N/A;    Social History   Socioeconomic History   Marital status: Married    Spouse name: Greggory Stallion   Number of children: 2   Years of education: 12th grade   Highest education level: Not on file  Occupational History   Occupation: retired    Associate Professor: UENPLOYED    Employer: RETIRED  Tobacco Use   Smoking status: Never    Passive exposure: Never   Smokeless tobacco: Never  Vaping Use   Vaping status:  Never Used  Substance and Sexual Activity   Alcohol use: No    Alcohol/week: 0.0 standard drinks of alcohol   Drug use: No   Sexual activity: Yes    Partners: Male    Birth control/protection: None  Other Topics Concern   Not on file  Social History Narrative   1 son, 1 daughter.   2 granddaughters.   Retired HR.    Social Determinants of Health   Financial Resource Strain: Low Risk  (05/23/2023)   Overall Financial Resource Strain (CARDIA)    Difficulty of Paying Living Expenses: Not hard at all  Food Insecurity: No Food Insecurity (06/29/2023)   Hunger Vital Sign    Worried About Running Out of Food in the Last Year: Never true    Ran Out of Food in the Last Year: Never true  Transportation Needs: No Transportation Needs (06/29/2023)   PRAPARE - Administrator, Civil Service (Medical): No    Lack of Transportation (Non-Medical): No  Physical Activity: Insufficiently Active (05/23/2023)   Exercise Vital Sign    Days of Exercise per Week: 3 days    Minutes of Exercise per Session: 30 min  Stress: No Stress Concern Present (05/23/2023)   [employer] of Occupational Health - Occupational Stress Questionnaire    Feeling of Stress : Not at all  Social Connections: Moderately Integrated (05/23/2023)   Social Connection and Isolation Panel [NHANES]    Frequency of Communication with Friends and Family: More than three times a week    Frequency of Social Gatherings with Friends and Family: More than three times a week    Attends Religious Services: More than 4 times per year    Active Member of [employer] or Organizations: No    Attends Banker Meetings: Never    Marital Status: Married  Catering manager Violence: Not At Risk (06/29/2023)   Humiliation, Afraid, Rape, and Kick questionnaire    Fear of Current or Ex-Partner: No    Emotionally Abused: No    Physically Abused: No    Sexually Abused: No    Outpatient Encounter Medications as of 09/21/2023   Medication Sig   cetirizine (ZYRTEC) 10 MG tablet Take 10 mg by mouth daily.   citalopram (CELEXA) 20 MG tablet TAKE 1 TABLET BY MOUTH EVERY DAY   fluticasone (FLONASE) 50 MCG/ACT nasal spray Place 2 sprays into both nostrils daily.   HYDROcodone-acetaminophen (NORCO) 10-325 MG tablet Take by mouth.   lactose free nutrition (BOOST) LIQD Take 237 mLs by mouth daily.   levothyroxine (SYNTHROID) 88 MCG tablet TAKE 1 TABLET BY MOUTH EVERY DAY BEFORE BREAKFAST   nilotinib (TASIGNA) 150 MG capsule Take 2  capsules (300 mg total) by mouth every 12 (twelve) hours.   Probiotic Product (DIGESTIVE ADV PREBIOT+PROBIOT) CHEW Chew 2 each by mouth daily.   rosuvastatin (CRESTOR) 5 MG tablet Take 1 tablet (5 mg total) by mouth at bedtime.   triamcinolone (NASACORT) 55 MCG/ACT AERO nasal inhaler Place 2 sprays into the nose daily.   No facility-administered encounter medications on file as of 09/21/2023.    Allergies  Allergen Reactions   Nucynta [Tapentadol Hcl] Swelling   Codeine Nausea Only and Other (See Comments)    Headache   Sulfonamide Derivatives Nausea Only and Other (See Comments)    Headache   Nexium [Esomeprazole Magnesium] Nausea Only   Sulfa Antibiotics Nausea Only    Headache    Fluconazole Rash    Redness and blistering on left thigh   ROS per HPI, otherwise negative      Objective:  BP 123/69   Pulse 93   Temp (!) 94.9 F (34.9 C) (Temporal)   Ht 5\' 5"  (1.651 m)   Wt 209 lb (94.8 kg)   SpO2 96%   BMI 34.78 kg/m    Wt Readings from Last 3 Encounters:  09/21/23 209 lb (94.8 kg)  08/30/23 214 lb 9.6 oz (97.3 kg)  08/19/23 214 lb (97.1 kg)    Physical Exam Vitals and nursing note reviewed.  Constitutional:      General: She is not in acute distress.    Appearance: She is obese. She is ill-appearing (chronically). She is not toxic-appearing or diaphoretic.  HENT:     Head: Normocephalic and atraumatic.     Mouth/Throat:     Mouth: Mucous membranes are moist.   Eyes:     Extraocular Movements: Extraocular movements intact.     Pupils: Pupils are equal, round, and reactive to light.  Neck:     Thyroid: No thyroid mass, thyromegaly or thyroid tenderness.  Musculoskeletal:     Cervical back: Normal range of motion and neck supple.  Lymphadenopathy:     Cervical: No cervical adenopathy.  Neurological:     General: No focal deficit present.     Mental Status: She is alert and oriented to person, place, and time.  Psychiatric:        Attention and Perception: Attention and perception normal.        Mood and Affect: Mood is anxious. Affect is tearful.        Speech: Speech normal.        Behavior: Behavior is cooperative.        Thought Content: Thought content normal.        Cognition and Memory: Cognition and memory normal.        Judgment: Judgment normal.   CHEST: Lungs clear to auscultation, no rhonchi or rales. CARDIOVASCULAR: Heart rate regular, no murmurs detected. No carotid bruits. ABDOMEN: Abdomen soft and nontender EXTREMITIES: Mild pedal edema.  SKIN: Diaphoresis.       Results for orders placed or performed in visit on 09/20/23  Iron and TIBC ([facility] ONLY)  Result Value Ref Range   Iron 46 28 - 170 ug/dL   TIBC 403 474 - 259 ug/dL   Saturation Ratios 16 10.4 - 31.8 %   UIBC 250 ug/dL  Ferritin  Result Value Ref Range   Ferritin 322 (H) 11 - 307 ng/mL  CBC with Differential/Platelet  Result Value Ref Range   WBC 15.0 (H) 4.0 - 10.5 K/uL   RBC 4.50 3.87 - 5.11 MIL/uL  Hemoglobin 12.1 12.0 - 15.0 g/dL   HCT 91.4 78.2 - 95.6 %   MCV 89.3 80.0 - 100.0 fL   MCH 26.9 26.0 - 34.0 pg   MCHC 30.1 30.0 - 36.0 g/dL   RDW 21.3 08.6 - 57.8 %   Platelets 268 150 - 400 K/uL   nRBC 0.0 0.0 - 0.2 %   Neutrophils Relative % 79 %   Neutro Abs 11.8 (H) 1.7 - 7.7 K/uL   Lymphocytes Relative 13 %   Lymphs Abs 2.0 0.7 - 4.0 K/uL   Monocytes Relative 6 %   Monocytes Absolute 0.9 0.1 - 1.0 K/uL   Eosinophils  Relative 1 %   Eosinophils Absolute 0.2 0.0 - 0.5 K/uL   Basophils Relative 0 %   Basophils Absolute 0.0 0.0 - 0.1 K/uL   Immature Granulocytes 1 %   Abs Immature Granulocytes 0.10 (H) 0.00 - 0.07 K/uL  Sample to Blood Bank(Blood Bank Hold)  Result Value Ref Range   Blood Bank Specimen SAMPLE AVAILABLE FOR TESTING    Sample Expiration      09/21/2023,2359 Performed at [facility], [address], [place], [place] [address]      Results Procedure: Electrocardiogram (EKG) Description: SR, 86, PR 150 ms, QT 344 ms, no ectopy. No significant changes from the last EKG. No acute ST changes.  Procedure: Chest X-ray Description: No significant changes from the previous chest x-ray. No obvious pneumonias or infiltrates.  LABS Hgb: 12 (09/20/2023)  Pertinent labs & imaging results that were available during my care of the patient were reviewed by me and considered in my medical decision making.  Assessment & Plan:  Andrea "Amariz Grenz" was seen today for weakness and shortness of breath.     Generalized Weakness Patient reports feeling weak and tired, with intermittent shortness of breath. No clear etiology identified. Patient is currently undergoing chemotherapy, which may contribute to symptoms. -Order EKG, chest x-ray, and echocardiogram to evaluate for potential cardiac causes. -Order lab work including anemia profile, electrolytes, vitamin B12, vitamin D, and thyroid function tests to identify potential underlying causes.  Hyperhidrosis Patient reports excessive sweating, particularly during chemotherapy. This is likely a side effect of the chemotherapy. -Discussed potential use of Botox injections for symptom management. Patient to discuss with oncologist.     Diagnoses and all orders for this visit:  Weakness -     EKG 12-Lead -     DG Chest 2 View; Future -     CMP14+EGFR -     Anemia Profile B -     Thyroid Panel With TSH -     VITAMIN D 25 Hydroxy (Vit-D Deficiency,  Fractures) -     ECHOCARDIOGRAM COMPLETE; Future  Malaise and fatigue -     EKG 12-Lead -     DG Chest 2 View; Future -     CMP14+EGFR -     Anemia Profile B -     Thyroid Panel With TSH -     VITAMIN D 25 Hydroxy (Vit-D Deficiency, Fractures) -     ECHOCARDIOGRAM COMPLETE; Future  Shortness of breath -     EKG 12-Lead -     DG Chest 2 View; Future -     CMP14+EGFR -     Anemia Profile B -     Thyroid Panel With TSH -     ECHOCARDIOGRAM COMPLETE; Future  Vitamin D deficiency -     CMP14+EGFR -     VITAMIN D 25  Hydroxy (Vit-D Deficiency, Fractures)     Continue all other maintenance medications.  Follow up plan: Return if symptoms worsen or fail to improve.   Continue healthy lifestyle choices, including diet (rich in fruits, vegetables, and lean proteins, and low in salt and simple carbohydrates) and exercise (at least 30 minutes of moderate physical activity daily).   The above assessment and management plan was discussed with the patient. The patient verbalized understanding of and has agreed to the management plan. Patient is aware to call the clinic if they develop any new symptoms or if symptoms persist or worsen. Patient is aware when to return to the clinic for a follow-up visit. Patient educated on when it is appropriate to go to the emergency department.   Kari Baars, FNP-C [facility] [phone]

## 2023-09-22 ENCOUNTER — Inpatient Hospital Stay: Payer: PPO | Admitting: Hematology

## 2023-09-22 LAB — ANEMIA PROFILE B
Basophils Absolute: 0 10*3/uL (ref 0.0–0.2)
Basos: 0 %
EOS (ABSOLUTE): 0.2 10*3/uL (ref 0.0–0.4)
Eos: 1 %
Ferritin: 506 ng/mL — ABNORMAL HIGH (ref 15–150)
Folate: >20 ng/mL (ref >3.0)
Hematocrit: 39 % (ref 34.0–46.6)
Hemoglobin: 12.5 g/dL (ref 11.1–15.9)
Immature Grans (Abs): 0.1 10*3/uL (ref 0.0–0.1)
Immature Granulocytes: 1 %
Iron Saturation: 16 % (ref 15–55)
Iron: 42 ug/dL (ref 27–139)
Lymphocytes Absolute: 1.6 10*3/uL (ref 0.7–3.1)
Lymphs: 12 %
MCH: 27.8 pg (ref 26.6–33.0)
MCHC: 32.1 g/dL (ref 31.5–35.7)
MCV: 87 fL (ref 79–97)
Monocytes Absolute: 0.8 10*3/uL (ref 0.1–0.9)
Monocytes: 6 %
Neutrophils Absolute: 10.6 10*3/uL — ABNORMAL HIGH (ref 1.4–7.0)
Neutrophils: 80 %
Platelets: 301 10*3/uL (ref 150–450)
RBC: 4.5 x10E6/uL (ref 3.77–5.28)
RDW: 13.2 % (ref 11.7–15.4)
Retic Ct Pct: 1.2 % (ref 0.6–2.6)
Total Iron Binding Capacity: 259 ug/dL (ref 250–450)
UIBC: 217 ug/dL (ref 118–369)
Vitamin B-12: 1163 pg/mL (ref 232–1245)
WBC: 13.2 10*3/uL — ABNORMAL HIGH (ref 3.4–10.8)

## 2023-09-22 LAB — CMP14+EGFR
ALT: 19 [IU]/L (ref 0–32)
AST: 22 [IU]/L (ref 0–40)
Albumin: 4.2 g/dL (ref 3.8–4.8)
Alkaline Phosphatase: 184 [IU]/L — ABNORMAL HIGH (ref 44–121)
BUN/Creatinine Ratio: 20 (ref 12–28)
BUN: 22 mg/dL (ref 8–27)
Bilirubin Total: 0.6 mg/dL (ref 0.0–1.2)
CO2: 19 mmol/L — ABNORMAL LOW (ref 20–29)
Calcium: 9.4 mg/dL (ref 8.7–10.3)
Chloride: 104 mmol/L (ref 96–106)
Creatinine, Ser: 1.08 mg/dL — ABNORMAL HIGH (ref 0.57–1.00)
Globulin, Total: 3.3 g/dL (ref 1.5–4.5)
Glucose: 106 mg/dL — ABNORMAL HIGH (ref 70–99)
Potassium: 4.8 mmol/L (ref 3.5–5.2)
Sodium: 141 mmol/L (ref 134–144)
Total Protein: 7.5 g/dL (ref 6.0–8.5)
eGFR: 53 mL/min/{1.73_m2} — ABNORMAL LOW (ref >59)

## 2023-09-22 LAB — VITAMIN D 25 HYDROXY (VIT D DEFICIENCY, FRACTURES): Vit D, 25-Hydroxy: 28.1 ng/mL — ABNORMAL LOW (ref 30.0–100.0)

## 2023-09-22 LAB — THYROID PANEL WITH TSH
Free Thyroxine Index: 3.2 (ref 1.2–4.9)
T3 Uptake Ratio: 30 % (ref 24–39)
T4, Total: 10.7 ug/dL (ref 4.5–12.0)
TSH: 0.12 u[IU]/mL — ABNORMAL LOW (ref 0.450–4.500)

## 2023-09-22 NOTE — Addendum Note (Signed)
Addended by: [name] on: 09/22/2023 07:55 AM   Modules accepted: Orders

## 2023-09-23 ENCOUNTER — Telehealth: Payer: Self-pay | Admitting: Family Medicine

## 2023-09-26 NOTE — Telephone Encounter (Signed)
R/C to Patient - Patient is aware of Appt information.

## 2023-09-27 ENCOUNTER — Other Ambulatory Visit: Payer: Self-pay | Admitting: *Deleted

## 2023-09-27 DIAGNOSIS — C9210 Chronic myeloid leukemia, BCR/ABL-positive, not having achieved remission: Secondary | ICD-10-CM

## 2023-09-27 MED ORDER — NILOTINIB HCL 150 MG PO CAPS: 300.0000 mg | ORAL_CAPSULE | Freq: Two times a day (BID) | ORAL | 2 refills | Status: AC

## 2023-10-04 ENCOUNTER — Ambulatory Visit (HOSPITAL_COMMUNITY)
Admission: RE | Admit: 2023-10-04 | Discharge: 2023-10-04 | Disposition: A | Discharge status: Home / Self Care | Payer: PPO | Source: Ambulatory Visit | Attending: Family Medicine | Admitting: Family Medicine

## 2023-10-04 DIAGNOSIS — Z9049 Acquired absence of other specified parts of digestive tract: Secondary | ICD-10-CM | POA: Diagnosis not present

## 2023-10-04 DIAGNOSIS — R531 Weakness: Secondary | ICD-10-CM | POA: Diagnosis not present

## 2023-10-04 DIAGNOSIS — R5383 Other fatigue: Secondary | ICD-10-CM | POA: Insufficient documentation

## 2023-10-04 DIAGNOSIS — R7989 Other specified abnormal findings of blood chemistry: Secondary | ICD-10-CM | POA: Diagnosis not present

## 2023-10-04 DIAGNOSIS — R5381 Other malaise: Secondary | ICD-10-CM | POA: Diagnosis not present

## 2023-10-04 NOTE — Addendum Note (Signed)
Addended by: [name] on: 10/04/2023 12:55 PM   Modules accepted: Orders

## 2023-10-08 ENCOUNTER — Other Ambulatory Visit: Payer: Self-pay | Admitting: Family Medicine

## 2023-10-08 DIAGNOSIS — F419 Anxiety disorder, unspecified: Secondary | ICD-10-CM

## 2023-10-08 DIAGNOSIS — F320 Major depressive disorder, single episode, mild: Secondary | ICD-10-CM

## 2023-10-11 ENCOUNTER — Other Ambulatory Visit (HOSPITAL_COMMUNITY): Payer: Self-pay

## 2023-10-12 ENCOUNTER — Telehealth: Payer: Self-pay | Admitting: Family Medicine

## 2023-10-12 NOTE — Telephone Encounter (Signed)
Reviewed results and patient voiced understanding.

## 2023-10-18 ENCOUNTER — Ambulatory Visit: Payer: PPO | Admitting: Family Medicine

## 2023-10-18 ENCOUNTER — Telehealth: Payer: Self-pay

## 2023-10-18 NOTE — Telephone Encounter (Signed)
Oral Oncology Patient Advocate Encounter   Received notification that patient is due for re-enrollment for assistance for Tasigna through [employer].   Re-enrollment process has been initiated and will be submitted upon completion of necessary documents.  NPAF's phone number [phone].   I will continue to follow until final determination.   Ardeen Fillers, CPhT Oncology Pharmacy Patient Advocate  [facility]  [phone] (phone) [phone] (fax) 10/18/2023 9:14 AM

## 2023-10-18 NOTE — Telephone Encounter (Signed)
Called and spoke to patient regarding re-enrollment application for NPAF. Patient would like to sign application and drop off Proof of Income in person at MD Office. MD Office has been notified and application has been sent for patient and MD signatures. Patient is also applying for Medicare Extra Help to attach denial letter if needed. I will submit once all paperwork is back in hand. I will continue to follow and update until final determination.    Ardeen Fillers, CPhT Oncology Pharmacy Patient Advocate  [facility]  [phone] (phone) [phone] (fax) 10/18/2023 9:19 AM

## 2023-10-28 ENCOUNTER — Ambulatory Visit (HOSPITAL_COMMUNITY)
Admission: RE | Admit: 2023-10-28 | Discharge: 2023-10-28 | Disposition: A | Discharge status: Home / Self Care | Payer: PPO | Source: Ambulatory Visit | Attending: Family Medicine | Admitting: Family Medicine

## 2023-10-28 DIAGNOSIS — R5381 Other malaise: Secondary | ICD-10-CM | POA: Insufficient documentation

## 2023-10-28 DIAGNOSIS — R531 Weakness: Secondary | ICD-10-CM | POA: Insufficient documentation

## 2023-10-28 DIAGNOSIS — R0602 Shortness of breath: Secondary | ICD-10-CM | POA: Insufficient documentation

## 2023-10-28 DIAGNOSIS — R5383 Other fatigue: Secondary | ICD-10-CM | POA: Diagnosis not present

## 2023-10-28 LAB — ECHOCARDIOGRAM COMPLETE
AR max vel: 2.89 cm2
AV Area VTI: 2.98 cm2
AV Area mean vel: 3.08 cm2
AV Mean grad: 4 mm[Hg]
AV Peak grad: 8.1 mm[Hg]
Ao pk vel: 1.42 m/s
Area-P 1/2: 3.68 cm2
Calc EF: 58.5 %
MV VTI: 3.28 cm2
S' Lateral: 2.4 cm
Single Plane A2C EF: 50.4 %
Single Plane A4C EF: 61.8 %

## 2023-11-01 NOTE — Telephone Encounter (Signed)
Oral Oncology Patient Advocate Encounter   Received notification re-enrollment for assistance for Tasigna through [employer] has been approved. Patient may continue to receive their medication at $0 from this program.    NPAF's phone number [phone].   Effective dates: 12/14/23 through 12/12/24   Ardeen Fillers, CPhT Oncology Pharmacy Patient Advocate  [facility]  [phone] (phone) [phone] (fax) 11/01/2023 1:52 PM

## 2023-11-01 NOTE — Telephone Encounter (Signed)
Oral Oncology Patient Advocate Encounter   Submitted application for assistance for Tasigna to [employer].   Application submitted via e-fax to [phone]   NPAF's phone number [phone].   I will continue to check the status until final determination.    Ardeen Fillers, CPhT Oncology Pharmacy Patient Advocate  [facility]  [phone] (phone) [phone] (fax) 11/01/2023 10:58 AM

## 2023-11-07 ENCOUNTER — Telehealth: Payer: Self-pay | Admitting: Internal Medicine

## 2023-11-07 NOTE — Telephone Encounter (Signed)
Spoke with patient and she wanted to know what the office visit would entail since she has a fatty liver.  She said that she was getting ready to leave but please leave her a message and she bill be back in touch with the office.

## 2023-11-18 ENCOUNTER — Other Ambulatory Visit (HOSPITAL_COMMUNITY): Payer: Self-pay | Admitting: Family Medicine

## 2023-11-18 DIAGNOSIS — Z1231 Encounter for screening mammogram for malignant neoplasm of breast: Secondary | ICD-10-CM

## 2023-11-21 ENCOUNTER — Inpatient Hospital Stay: Payer: PPO | Attending: Hematology

## 2023-11-21 DIAGNOSIS — Z801 Family history of malignant neoplasm of trachea, bronchus and lung: Secondary | ICD-10-CM | POA: Diagnosis not present

## 2023-11-21 DIAGNOSIS — C9210 Chronic myeloid leukemia, BCR/ABL-positive, not having achieved remission: Secondary | ICD-10-CM | POA: Insufficient documentation

## 2023-11-21 DIAGNOSIS — D649 Anemia, unspecified: Secondary | ICD-10-CM | POA: Diagnosis not present

## 2023-11-21 DIAGNOSIS — D509 Iron deficiency anemia, unspecified: Secondary | ICD-10-CM

## 2023-11-21 DIAGNOSIS — Z79899 Other long term (current) drug therapy: Secondary | ICD-10-CM | POA: Diagnosis not present

## 2023-11-21 LAB — CBC WITH DIFFERENTIAL/PLATELET
Abs Immature Granulocytes: 0.05 10*3/uL (ref 0.00–0.07)
Basophils Absolute: 0.1 10*3/uL (ref 0.0–0.1)
Basophils Relative: 0 %
Eosinophils Absolute: 0.4 10*3/uL (ref 0.0–0.5)
Eosinophils Relative: 3 %
HCT: 38.6 % (ref 36.0–46.0)
Hemoglobin: 12.2 g/dL (ref 12.0–15.0)
Immature Granulocytes: 0 %
Lymphocytes Relative: 16 %
Lymphs Abs: 2.1 10*3/uL (ref 0.7–4.0)
MCH: 28.6 pg (ref 26.0–34.0)
MCHC: 31.6 g/dL (ref 30.0–36.0)
MCV: 90.6 fL (ref 80.0–100.0)
Monocytes Absolute: 0.9 10*3/uL (ref 0.1–1.0)
Monocytes Relative: 7 %
Neutro Abs: 9.4 10*3/uL — ABNORMAL HIGH (ref 1.7–7.7)
Neutrophils Relative %: 74 %
Platelets: 311 10*3/uL (ref 150–400)
RBC: 4.26 MIL/uL (ref 3.87–5.11)
RDW: 14.3 % (ref 11.5–15.5)
WBC: 12.8 10*3/uL — ABNORMAL HIGH (ref 4.0–10.5)
nRBC: 0 % (ref 0.0–0.2)

## 2023-11-21 LAB — COMPREHENSIVE METABOLIC PANEL
ALT: 22 U/L (ref 0–44)
AST: 34 U/L (ref 15–41)
Albumin: 4.1 g/dL (ref 3.5–5.0)
Alkaline Phosphatase: 151 U/L — ABNORMAL HIGH (ref 38–126)
Anion gap: 11 (ref 5–15)
BUN: 29 mg/dL — ABNORMAL HIGH (ref 8–23)
CO2: 24 mmol/L (ref 22–32)
Calcium: 9.6 mg/dL (ref 8.9–10.3)
Chloride: 106 mmol/L (ref 98–111)
Creatinine, Ser: 1.1 mg/dL — ABNORMAL HIGH (ref 0.44–1.00)
GFR, Estimated: 51 mL/min — ABNORMAL LOW (ref >60)
Glucose, Bld: 99 mg/dL (ref 70–99)
Potassium: 5 mmol/L (ref 3.5–5.1)
Sodium: 141 mmol/L (ref 135–145)
Total Bilirubin: 0.6 mg/dL (ref <1.2)
Total Protein: 8.1 g/dL (ref 6.5–8.1)

## 2023-11-21 LAB — IRON AND TIBC
Iron: 33 ug/dL (ref 28–170)
Saturation Ratios: 11 % (ref 10.4–31.8)
TIBC: 311 ug/dL (ref 250–450)
UIBC: 278 ug/dL

## 2023-11-21 LAB — FERRITIN: Ferritin: 197 ng/mL (ref 11–307)

## 2023-11-21 LAB — LACTATE DEHYDROGENASE: LDH: 147 U/L (ref 98–192)

## 2023-11-23 ENCOUNTER — Inpatient Hospital Stay: Payer: PPO

## 2023-11-24 ENCOUNTER — Other Ambulatory Visit: Payer: Self-pay | Admitting: "Endocrinology

## 2023-11-27 LAB — BCR-ABL1, CML/ALL, PCR, QUANT
E1A2 Transcript: <0.0032 %
Interpretation (BCRAL):: NEGATIVE
b2a2 transcript: <0.0032 %
b3a2 transcript: <0.0032 %

## 2023-11-28 ENCOUNTER — Telehealth: Payer: Self-pay | Admitting: Family Medicine

## 2023-11-28 ENCOUNTER — Ambulatory Visit (HOSPITAL_COMMUNITY)
Admission: RE | Admit: 2023-11-28 | Discharge: 2023-11-28 | Disposition: A | Discharge status: Home / Self Care | Payer: PPO | Source: Ambulatory Visit | Attending: Family Medicine | Admitting: Family Medicine

## 2023-11-28 DIAGNOSIS — Z1231 Encounter for screening mammogram for malignant neoplasm of breast: Secondary | ICD-10-CM | POA: Diagnosis not present

## 2023-11-28 NOTE — Telephone Encounter (Signed)
Did you want patient to come back in January or March?

## 2023-11-28 NOTE — Telephone Encounter (Signed)
Patient is needing a call back regarding her appt in jan she says she has been seen in November for her follow up and was told to come back February 22 2024 she needs a call back regarding this she would like a detailed message left

## 2023-11-29 NOTE — Telephone Encounter (Signed)
Patient aware and verbalizes understanding. 

## 2023-11-30 ENCOUNTER — Inpatient Hospital Stay (HOSPITAL_BASED_OUTPATIENT_CLINIC_OR_DEPARTMENT_OTHER): Payer: PPO | Admitting: Hematology

## 2023-11-30 ENCOUNTER — Encounter: Payer: Self-pay | Admitting: Family Medicine

## 2023-11-30 VITALS — BP 152/52 | HR 74 | Temp 97.0°F | Resp 19 | Ht 65.0 in | Wt 210.0 lb

## 2023-11-30 DIAGNOSIS — D509 Iron deficiency anemia, unspecified: Secondary | ICD-10-CM

## 2023-11-30 DIAGNOSIS — C9210 Chronic myeloid leukemia, BCR/ABL-positive, not having achieved remission: Secondary | ICD-10-CM

## 2023-11-30 NOTE — Progress Notes (Signed)
[name] [address], [place] [address]    Clinic Day:  11/30/2023  Referring physician: [name], FNP  Patient Care Team: Andrea Masters, FNP as PCP - General (Family Medicine) Andrea Massed, MD as Medical Oncologist (Medical Oncology) Andrea Kayser, MD as Consulting Physician (Endocrinology) Andrea Lick, MD as Consulting Physician (Orthopedic Surgery)   ASSESSMENT & PLAN:   Assessment: 1.  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 -Left pleural effusion and pericardial effusion requiring pericardial window on 10/25/2016.  Fluid cytology negative. -Started back on Nilotinib 300 mg every 12 hours on 12/06/2016.   2.  Daytime sweats: -He had a long history of daytime sweats.  She had extensive work-up including a CT scan CAP, which showed no evidence of malignancy.  Hepatic morphology suspicious for cirrhosis.    Plan: 1.  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  She has occasional daytime sweats.  No recent infections noted. - Labs reviewed from 11/21/2023: CBC was grossly normal with mild leukocytosis, predominantly neutrophils.  BCR/ABL by quantitative PCR is undetectable.  She had her last slightly positive PCR test in February.  Prior to that she was negative for several years.  We also reviewed LFTs which were grossly within normal limits. - We discussed the possibility of treatment for free recurrence control by stopping lenvatinib.  I discussed that she needs close monitoring with PCR test every month for the first 6 months after discontinuation of TKI.  I discussed that about 50% of the people continue to stay off therapy at the end of 1 year.  All her questions were answered to her satisfaction.  At this time she would like to continue neratinib.  She will return to the clinic in 6 months with repeat BCR/ABL by PCR and routine labs.   2.  Normocytic anemia: - She had Feraheme in  March.  Hemoglobin improved to 12.2 today.  Ferritin is 187 and percent saturation 11.  No indication for parenteral iron therapy.    Orders Placed This Encounter  Procedures   CBC with Differential    Standing Status:   Future    Expected Date:   05/16/2024    Expiration Date:   11/29/2024   Comprehensive metabolic panel    Standing Status:   Future    Expected Date:   05/16/2024    Expiration Date:   11/29/2024   Lactate dehydrogenase    Standing Status:   Future    Expected Date:   05/16/2024    Expiration Date:   11/29/2024   BCR-ABL1, CML/ALL, PCR, QUANT    Standing Status:   Future    Expected Date:   05/16/2024    Expiration Date:   11/29/2024   Iron and TIBC ([facility] ONLY)    Standing Status:   Future    Expected Date:   05/16/2024    Expiration Date:   11/29/2024   Ferritin    Standing Status:   Future    Expected Date:   05/16/2024    Expiration Date:   11/29/2024      I,Andrea Martinez,acting as a scribe for Andrea Massed, MD.,have documented all relevant documentation on the behalf of Andrea Massed, MD,as directed by  Andrea Massed, MD while in the presence of Andrea Massed, MD.   I, Andrea Massed MD, have reviewed the above documentation for accuracy and completeness, and I agree with the above.   Andrea Martinez  Andrea Saba, MD   12/18/20241:27 PM  CHIEF COMPLAINT:   Diagnosis: CML    Cancer Staging  No matching staging information was found for the patient.    Prior Therapy: Hydrea 1000 mg and Allopurinol 300 mg daily   Current Therapy:  Tasigna 300 mg Q 12 H    HISTORY OF PRESENT ILLNESS:   Oncology History  CML (chronic myeloid leukemia) (HCC)  11/21/2015 Initial Diagnosis   Chronic myelogenous leukemia (CML), BCR-ABL1-positive (HCC)   11/21/2015 Miscellaneous   Se[facility] by [facility]   11/21/2015 -  Chemotherapy   Hydrea 1000 mg and Allopurinol 300 mg daily (due to high uric acid level)   11/21/2015  Tumor Marker   BCR P210 Ratio of Fusion to Control, Blood 0.8941    P210 IS % Ratio, Blood 85.834%     01/02/2016 - 10/23/2016 Chemotherapy   Tasigna   01/08/2016 Miscellaneous   EKG- QT/QTc: 382/440 ms   01/16/2016 Miscellaneous   EKG- QT/QTc: 364/419   03/30/2016 Tumor Marker   b2a2 transcript: 0.317 b3a2 transcript: 0.194   10/23/2016 - 11/05/2016 Hospital Admission   large Left Pleural Effusion and Pericardial Effusion that is now s/p VATS with Drainage, and Drainage of Pericardial Fluid with Window and Biopsy of Pericardium   12/06/2016 -  Chemotherapy   Tasigna restarted    05/24/2017 Adverse Reaction   Prolonged QTc on EKG; likely d/t chemo. Tasigna ON HOLD until next visit.    06/10/2017 -  Chemotherapy   Resumed Tasigna   07/13/2017 Miscellaneous   Evaluated by Cardiology for prolonged QTc. Per Dr. Purvis Martinez, patient's QT intervals are not prolonged to the point of potentially promoting a ventricular arrhythmia. He thinks she can continue on current medical therapy for her CML with serial ECG monitoring.   12/27/2017 Miscellaneous   EKG: QT/QTc 398/435      INTERVAL HISTORY:   Andrea Martinez is a 77 y.o. female presenting to clinic today for follow up of CML. She was last seen by me on 08/09/23.  Today, she states that she is doing well overall. Her appetite level is at 100%. Her energy level is at 25%.  PAST MEDICAL HISTORY:   Past Medical History: Past Medical History:  Diagnosis Date   Anxiety    denies   Arthritis    Chronic myelogenous leukemia (CML), 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 Leslie Martinez   Lipoma    left upper arm   Mixed hyperlipidemia    Osteoporosis    Pericardial effusion    a. HCAP complicated by pericardial effusion requiring pericardial window 10/2016 and large L pleural effusion requring VATS.   Pleural effusion    a. s/p VATS  2017.   Pneumonia 10/2016   PONV (postoperative nausea and vomiting)    history of   Prolonged QT interval    Urine incontinence    UTI (lower urinary tract infection)    frequent   Vitamin D deficiency     Surgical History: Past Surgical History:  Procedure Laterality Date   BIOPSY  03/20/2022   Procedure: BIOPSY;  Surgeon: Lanelle Bal, DO;  [facility];  Service: Endoscopy;;  gastric lesion vs. gastric ulcer   BIOPSY  06/28/2022   Procedure: BIOPSY;  Surgeon: Lanelle Bal, DO;  [facility];  Service: Endoscopy;;   BIOPSY  05/10/2023  Procedure: BIOPSY;  Surgeon: Lanelle Bal, DO;  [facility];  Service: Endoscopy;;   BRAVO PH STUDY  12/04/2012   Procedure: BRAVO PH STUDY;  Surgeon: West Bali, MD;  [facility];  Service: Endoscopy;  Laterality: N/A;   CHOLECYSTECTOMY N/A 01/03/2014   Procedure: LAPAROSCOPIC CHOLECYSTECTOMY WITH INTRAOPERATIVE CHOLANGIOGRAM;  Surgeon: Clovis Pu. Cornett, MD;  Location: [facility];  Service: General;  Laterality: N/A;   COLONOSCOPY  01/2004   DR [facility], few small tics   COLONOSCOPY WITH PROPOFOL N/A 05/10/2023   Procedure: COLONOSCOPY WITH PROPOFOL;  Surgeon: Lanelle Bal, DO;  [facility];  Service: Endoscopy;  Laterality: N/A;  1245pm, asa 3   ESOPHAGOGASTRODUODENOSCOPY  02/2010   gastric ulcers   ESOPHAGOGASTRODUODENOSCOPY  12/04/2012   EXB:MWUXLKG gastritis (inflammation) was found in the gastric antrum; multiple biopsies The duodenal mucosa showed no abnormalities in the bulb and second portion of the duodenum   ESOPHAGOGASTRODUODENOSCOPY (EGD) WITH PROPOFOL N/A 03/20/2022   Procedure: ESOPHAGOGASTRODUODENOSCOPY (EGD) WITH PROPOFOL;  Surgeon: Lanelle Bal, DO;  [facility];  Service: Endoscopy;  Laterality: N/A;   ESOPHAGOGASTRODUODENOSCOPY (EGD) WITH PROPOFOL N/A 06/28/2022   Procedure: ESOPHAGOGASTRODUODENOSCOPY (EGD) WITH  PROPOFOL;  Surgeon: Lanelle Bal, DO;  [facility];  Service: Endoscopy;  Laterality: N/A;  800   ESOPHAGOGASTRODUODENOSCOPY (EGD) WITH PROPOFOL N/A 05/10/2023   Procedure: ESOPHAGOGASTRODUODENOSCOPY (EGD) WITH PROPOFOL;  Surgeon: Lanelle Bal, DO;  [facility];  Service: Endoscopy;  Laterality: N/A;   JOINT REPLACEMENT Right 10/22/2019   KYPHOPLASTY N/A 02/14/2019   Procedure: KYPHOPLASTY T12 and L1;  Surgeon: Andrea Lick, MD;  [facility];  Service: Orthopedics;  Laterality: N/A;  120 mins   LASIK     LIPOMA EXCISION  08/02/2011   left shoulder   NOSE SURGERY     PARTIAL HYSTERECTOMY     vaginal at age 69 years of age   POLYPECTOMY  05/10/2023   Procedure: POLYPECTOMY;  Surgeon: Lanelle Bal, DO;  [facility];  Service: Endoscopy;;   TOE DEBRIDEMENT Left 1962   lt great toe   TOTAL HIP ARTHROPLASTY Right 11/18/2021   Procedure: TOTAL HIP ARTHROPLASTY ANTERIOR APPROACH;  Surgeon: Ollen Gross, MD;  [facility]: [facility];  Service: Orthopedics;  Laterality: Right;   TOTAL HIP ARTHROPLASTY Left 06/29/2023   Procedure: LEFT TOTAL HIP ARTHROPLASTY ANTERIOR APPROACH;  Surgeon: Ollen Gross, MD;  [facility];  Service: Orthopedics;  Laterality: Left;   TOTAL KNEE ARTHROPLASTY Right 10/22/2019   Procedure: TOTAL KNEE ARTHROPLASTY;  Surgeon: Ollen Gross, MD;  [facility];  Service: Orthopedics;  Laterality: Right;    TOTAL KNEE ARTHROPLASTY Left 03/03/2020   Procedure: TOTAL KNEE ARTHROPLASTY;  Surgeon: Ollen Gross, MD;  [facility];  Service: Orthopedics;  Laterality: Left;    TRANSFORAMINAL LUMBAR INTERBODY FUSION (TLIF) WITH PEDICLE SCREW FIXATION 1 LEVEL N/A 01/28/2021   Procedure: TRANSFORAMINAL LUMBAR INTERBODY FUSION (TLIF) LUMBAR FIVE-SACRAL ONE;  Surgeon: Andrea Lick, MD;  [facility];  Service: Orthopedics;  Laterality: N/A;  4 hrs   VIDEO ASSISTED THORACOSCOPY (VATS)/EMPYEMA Left 10/25/2016    Procedure: VIDEO ASSISTED THORACOSCOPY (VATS), BRONCH,DRAINAGE OF PLEURAL EFFUSION,PERICARDIAL WINDOW WITH DRAINAGE OF PERICARDIAL FLUID, TEE;  Surgeon: Loreli Slot, MD;  [facility];  Service: Thoracic;  Laterality: Left;   VIDEO BRONCHOSCOPY N/A 10/25/2016   Procedure: VIDEO BRONCHOSCOPY;  Surgeon: Loreli Slot, MD;  [facility];  Service: Thoracic;  Laterality: N/A;    Social History: Social History   Socioeconomic History   Marital status: Married    Spouse name: Greggory Stallion   Number of children: 2   Years of education: 12th grade   Highest education level: Not on file  Occupational History   Occupation: retired    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   2 granddaughters.   Retired HR.    Social Drivers of Corporate investment banker Strain: Low Risk  (05/23/2023)   Overall Financial Resource Strain (CARDIA)    Difficulty of Paying Living Expenses: Not hard at all  Food Insecurity: No Food Insecurity (06/29/2023)   Hunger Vital Sign    Worried About Running Out of Food in the Last Year: Never true    Ran Out of Food in the Last Year: Never true  Transportation Needs: No Transportation Needs (06/29/2023)   PRAPARE - Administrator, Civil Service (Medical): No    Lack of Transportation (Non-Medical): No  Physical Activity: Insufficiently Active (05/23/2023)   Exercise Vital Sign    Days of Exercise per Week: 3 days    Minutes of Exercise per Session: 30 min  Stress: No Stress Concern Present (05/23/2023)   [employer] of  Occupational Health - Occupational Stress Questionnaire    Feeling of Stress : Not at all  Social Connections: Moderately Integrated (05/23/2023)   Social Connection and Isolation Panel [NHANES]    Frequency of Communication with Friends and Family: More than three times a week    Frequency of Social Gatherings with Friends and Family: More than three times a week    Attends Religious Services: More than 4 times per year    Active Member of [employer] or Organizations: No    Attends Banker Meetings: Never    Marital Status: Married  Catering manager Violence: Not At Risk (06/29/2023)   Humiliation, Afraid, Rape, and Kick questionnaire    Fear of Current or Ex-Partner: No    Emotionally Abused: No    Physically Abused: No    Sexually Abused: No    Family History: Family History  Problem Relation Age of Onset   Lung cancer Father    COPD Mother    Stroke Mother    Thyroid disease Mother    Colon cancer Neg Hx    Anesthesia problems Neg Hx    Hypotension Neg Hx    Malignant hyperthermia Neg Hx    Pseudochol deficiency Neg Hx     Current Medications:  Current Outpatient Medications:    cetirizine (ZYRTEC) 10 MG tablet, Take 10 mg by mouth daily., Disp: , Rfl:    citalopram (CELEXA) 20 MG tablet, TAKE 1 TABLET BY MOUTH EVERY DAY, Disp: 90 tablet, Rfl: 0   lactose free nutrition (BOOST) LIQD, Take 237 mLs by mouth daily., Disp: , Rfl:    levothyroxine (SYNTHROID) 88 MCG tablet, TAKE 1 TABLET BY MOUTH EVERY DAY BEFORE BREAKFAST, Disp: 90  tablet, Rfl: 0   nilotinib (TASIGNA) 150 MG capsule, Take 2 capsules (300 mg total) by mouth every 12 (twelve) hours., Disp: 120 capsule, Rfl: 2   Probiotic Product (DIGESTIVE ADV PREBIOT+PROBIOT) CHEW, Chew 2 each by mouth daily., Disp: , Rfl:    rosuvastatin (CRESTOR) 5 MG tablet, Take 1 tablet (5 mg total) by mouth at bedtime., Disp: 90 tablet, Rfl: 1   Allergies: Allergies  Allergen Reactions   Nucynta [Tapentadol Hcl] Swelling    Codeine Nausea Only and Other (See Comments)    Headache   Sulfonamide Derivatives Nausea Only and Other (See Comments)    Headache   Nexium [Esomeprazole Magnesium] Nausea Only   Sulfa Antibiotics Nausea Only    Headache    Fluconazole Rash    Redness and blistering on left thigh    REVIEW OF SYSTEMS:   Review of Systems  Constitutional:  Negative for chills, fatigue and fever.  HENT:   Negative for lump/mass, mouth sores, nosebleeds, sore throat and trouble swallowing.   Eyes:  Negative for eye problems.  Respiratory:  Positive for shortness of breath. Negative for cough.   Cardiovascular:  Negative for chest pain, leg swelling and palpitations.  Gastrointestinal:  Negative for abdominal pain, constipation, diarrhea, nausea and vomiting.  Genitourinary:  Negative for bladder incontinence, difficulty urinating, dysuria, frequency, hematuria and nocturia.   Musculoskeletal:  Negative for arthralgias, back pain, flank pain, myalgias and neck pain.  Skin:  Negative for itching and rash.  Neurological:  Negative for dizziness, headaches and numbness.  Hematological:  Does not bruise/bleed easily.  Psychiatric/Behavioral:  Positive for sleep disturbance. Negative for depression and suicidal ideas. The patient is not nervous/anxious.   All other systems reviewed and are negative.    VITALS:   Blood pressure (!) 152/52, pulse 74, temperature (!) 97 F (36.1 C), temperature source Tympanic, resp. rate 19, height 5\' 5"  (1.651 m), weight 210 lb (95.3 kg), SpO2 97%.  Wt Readings from Last 3 Encounters:  11/30/23 210 lb (95.3 kg)  09/21/23 209 lb (94.8 kg)  08/30/23 214 lb 9.6 oz (97.3 kg)    Body mass index is 34.95 kg/m.  Performance status (ECOG): 1 - Symptomatic but completely ambulatory  PHYSICAL EXAM:   Physical Exam Vitals and nursing note reviewed. Exam conducted with a chaperone present.  Constitutional:      Appearance: Normal appearance.  Cardiovascular:     Rate and  Rhythm: Normal rate and regular rhythm.     Pulses: Normal pulses.     Heart sounds: Normal heart sounds.  Pulmonary:     Effort: Pulmonary effort is normal.     Breath sounds: Normal breath sounds.  Abdominal:     Palpations: Abdomen is soft. There is no hepatomegaly, splenomegaly or mass.     Tenderness: There is no abdominal tenderness.  Musculoskeletal:     Right lower leg: No edema.     Left lower leg: No edema.  Lymphadenopathy:     Cervical: No cervical adenopathy.     Right cervical: No superficial, deep or posterior cervical adenopathy.    Left cervical: No superficial, deep or posterior cervical adenopathy.     Upper Body:     Right upper body: No supraclavicular or axillary adenopathy.     Left upper body: No supraclavicular or axillary adenopathy.  Neurological:     General: No focal deficit present.     Mental Status: She is alert and oriented to person, place, and time.  Psychiatric:  Mood and Affect: Mood normal.        Behavior: Behavior normal.     LABS:      Latest Ref Rng & Units 11/21/2023    1:49 PM 09/21/2023    2:35 PM 09/20/2023    9:12 AM  CBC  WBC 4.0 - 10.5 K/uL 12.8  13.2  15.0   Hemoglobin 12.0 - 15.0 g/dL 25.3  66.4  40.3   Hematocrit 36.0 - 46.0 % 38.6  39.0  40.2   Platelets 150 - 400 K/uL 311  301  268       Latest Ref Rng & Units 11/21/2023    1:49 PM 09/21/2023    2:35 PM 07/26/2023    1:17 PM  CMP  Glucose 70 - 99 mg/dL 99  474  259   BUN 8 - 23 mg/dL 29  22  26    Creatinine 0.44 - 1.00 mg/dL 5.63  8.75  6.43   Sodium 135 - 145 mmol/L 141  141  136   Potassium 3.5 - 5.1 mmol/L 5.0  4.8  4.7   Chloride 98 - 111 mmol/L 106  104  103   CO2 22 - 32 mmol/L 24  19  21    Calcium 8.9 - 10.3 mg/dL 9.6  9.4  9.1   Total Protein 6.5 - 8.1 g/dL 8.1  7.5  7.8   Total Bilirubin <1.2 mg/dL 0.6  0.6  0.7   Alkaline Phos 38 - 126 U/L 151  184  195   AST 15 - 41 U/L 34  22  30   ALT 0 - 44 U/L 22  19  18       No results found for:  "CEA1", "CEA" / No results found for: "CEA1", "CEA" No results found for: "PSA1" No results found for: "PIR518" No results found for: "CAN125"  No results found for: "TOTALPROTELP", "ALBUMINELP", "A1GS", "A2GS", "BETS", "BETA2SER", "GAMS", "MSPIKE", "SPEI" Lab Results  Component Value Date   TIBC 311 11/21/2023   TIBC 259 09/21/2023   TIBC 296 09/20/2023   FERRITIN 197 11/21/2023   FERRITIN 506 (H) 09/21/2023   FERRITIN 322 (H) 09/20/2023   IRONPCTSAT 11 11/21/2023   IRONPCTSAT 16 09/21/2023   IRONPCTSAT 16 09/20/2023   Lab Results  Component Value Date   LDH 147 11/21/2023   LDH 164 07/26/2023   LDH 150 04/27/2023     STUDIES:   MM 3D SCREENING MAMMOGRAM BILATERAL BREAST Result Date: 11/30/2023 CLINICAL DATA:  Screening. EXAM: DIGITAL SCREENING BILATERAL MAMMOGRAM WITH TOMOSYNTHESIS AND CAD TECHNIQUE: Bilateral screening digital craniocaudal and mediolateral oblique mammograms were obtained. Bilateral screening digital breast tomosynthesis was performed. The images were evaluated with computer-aided detection. COMPARISON:  Previous exam(s). ACR Breast Density Category b: There are scattered areas of fibroglandular density. FINDINGS: There are no findings suspicious for malignancy. IMPRESSION: No mammographic evidence of malignancy. A result letter of this screening mammogram will be mailed directly to the patient. RECOMMENDATION: Screening mammogram in one year. (Code:SM-B-01Y) BI-RADS CATEGORY  1: Negative. Electronically Signed   By: Elberta Fortis M.D.   On: 11/30/2023 09:11

## 2023-11-30 NOTE — Progress Notes (Signed)
Patient is taking Tasigna as prescribed.  She has not missed any doses and reports no side effects at this time.

## 2023-11-30 NOTE — Patient Instructions (Signed)
 [facility] at [facility] Discharge Instructions   You were seen and examined today by Dr. [name].  He reviewed the results of your lab work which are normal/stable.   Continue Tasigna as prescribed.   Return as scheduled.    Thank you for choosing [facility] at [facility] to provide your oncology and hematology care.  To afford each patient quality time with our provider, please arrive at least 15 minutes before your scheduled appointment time.   If you have a lab appointment with the Cancer Center please come in thru the Main Entrance and check in at the main information desk.  You need to re-schedule your appointment should you arrive 10 or more minutes late.  We strive to give you quality time with our providers, and arriving late affects you and other patients whose appointments are after yours.  Also, if you no show three or more times for appointments you may be dismissed from the clinic at the providers discretion.     Again, thank you for choosing [facility].  Our hope is that these requests will decrease the amount of time that you wait before being seen by our physicians.       _____________________________________________________________  Should you have questions after your visit to [facility], please contact our office at [phone] and follow the prompts.  Our office hours are 8:00 a.m. and 4:30 p.m. Monday - Friday.  Please note that voicemails left after 4:00 p.m. may not be returned until the following business day.  We are closed weekends and major holidays.  You do have access to a nurse 24-7, just call the main number to the clinic [phone] and do not press any options, hold on the line and a nurse will answer the phone.    For prescription refill requests, have your pharmacy contact our office and allow 72 hours.    Due to Covid, you will need to wear a mask upon entering the  hospital. If you do not have a mask, a mask will be given to you at the Main Entrance upon arrival. For doctor visits, patients may have 1 support person age 42 or older with them. For treatment visits, patients can not have anyone with them due to social distancing guidelines and our immunocompromised population.

## 2023-12-01 ENCOUNTER — Other Ambulatory Visit: Payer: Self-pay | Admitting: *Deleted

## 2023-12-01 ENCOUNTER — Ambulatory Visit (HOSPITAL_COMMUNITY)
Admission: RE | Admit: 2023-12-01 | Discharge: 2023-12-01 | Disposition: A | Discharge status: Home / Self Care | Payer: PPO | Source: Ambulatory Visit | Attending: Internal Medicine | Admitting: Internal Medicine

## 2023-12-01 ENCOUNTER — Encounter: Payer: Self-pay | Admitting: Internal Medicine

## 2023-12-01 ENCOUNTER — Ambulatory Visit: Payer: PPO | Admitting: Internal Medicine

## 2023-12-01 VITALS — BP 126/73 | HR 73 | Temp 98.2°F | Ht 65.0 in | Wt 214.2 lb

## 2023-12-01 DIAGNOSIS — R1031 Right lower quadrant pain: Secondary | ICD-10-CM | POA: Diagnosis not present

## 2023-12-01 DIAGNOSIS — K760 Fatty (change of) liver, not elsewhere classified: Secondary | ICD-10-CM

## 2023-12-01 DIAGNOSIS — K5730 Diverticulosis of large intestine without perforation or abscess without bleeding: Secondary | ICD-10-CM | POA: Diagnosis not present

## 2023-12-01 DIAGNOSIS — R112 Nausea with vomiting, unspecified: Secondary | ICD-10-CM

## 2023-12-01 DIAGNOSIS — K279 Peptic ulcer, site unspecified, unspecified as acute or chronic, without hemorrhage or perforation: Secondary | ICD-10-CM

## 2023-12-01 DIAGNOSIS — R161 Splenomegaly, not elsewhere classified: Secondary | ICD-10-CM | POA: Diagnosis not present

## 2023-12-01 DIAGNOSIS — R748 Abnormal levels of other serum enzymes: Secondary | ICD-10-CM | POA: Diagnosis not present

## 2023-12-01 DIAGNOSIS — K838 Other specified diseases of biliary tract: Secondary | ICD-10-CM | POA: Diagnosis not present

## 2023-12-01 DIAGNOSIS — R109 Unspecified abdominal pain: Secondary | ICD-10-CM | POA: Diagnosis not present

## 2023-12-01 MED ORDER — IOHEXOL 300 MG/ML  SOLN
100.0000 mL | Freq: Once | INTRAMUSCULAR | Status: AC | PRN
Start: 2023-12-01 — End: 2023-12-01
  Administered 2023-12-01: 100 mL via INTRAVENOUS

## 2023-12-01 NOTE — Patient Instructions (Signed)
I am going to order a CT abdomen pelvis to further evaluate your worsening abdominal pain, nausea or vomiting.  We will call with results.  In regards to your fatty liver, Recommend 1-2# weight loss per week until ideal body weight through exercise & diet. Low fat/cholesterol diet.   Avoid sweets, sodas, fruit juices, sweetened beverages like tea, etc. Gradually increase exercise from 15 min daily up to 1 hr per day 5 days/week. Limit alcohol use.  It was very nice seeing you today.  Dr. Marletta Lor

## 2023-12-01 NOTE — Progress Notes (Signed)
Referring Provider: [name], FNP Primary Care Physician:  [name], FNP Primary GI:  Dr. Marletta Lor  Chief Complaint  Patient presents with   New Patient (Initial Visit)    Pt here for elevated LFT's    HPI:   Andrea Martinez is a 77 y.o. female who presents to the clinic for follow up visit. She has a history of peptic ulcer disease, gastric intestinal metaplasia, chronic abdominal pain, CML, arthritis, bladder issues, depression.    Peptic ulcer disease:  EGD 03/20/2022 with 15 mm ulcer at the pylorus, nodular mucosa, biopsies negative for H. pylori.  Started on twice daily PPI.  EGD 06/28/2022 showed nearly healed ulcer.  Repeat biopsy showed gastric intestinal metaplasia.  No family history of gastric malignancy.  EGD 05/10/2023 with gastritis, duodenitis, healed ulcer in the duodenum.  Duodenal biopsies with peptic duodenitis.  Gastric biopsies unremarkable.  Abdominal pain: chronic, CT A/P with contrast 03/17/2023 showing long segment mild thickening of the sigmoid colon that may represent mild infectious or inflammatory colitis, numerous diverticula throughout the sigmoid colon although no focally inflamed diverticulum is seen. Slightly thickened walls of urinary bladder.   Completed 3-day course of azithromycin.  Continue to have issues was placed on Cipro and Flagyl which caused diarrhea as well as thrush.  Given Magic mouthwash.  Colonoscopy 05/02/2023 2 polyps removed, diverticulosis of the sigmoid colon.  Tubular adenomas on pathology.    Overall, states her chronic abdominal pain has not been giving her any issues whatsoever for months now. However starting approximately 1 to 2 weeks ago she had sudden onset right lower quadrant abdominal pain.  This is steadily worsened.  Notes associated nausea and vomiting.  States she was up crying all night due to her pain.  Decreased appetite.  Chronically elevated alkaline phosphatase: Denies any family history of liver  disease.  Does note chronic itching related to allergies which is well-controlled on cetirizine.  Status post cholecystectomy.  CT imaging showed mild biliary dilatation likely related to postcholecystectomy changes.  Viral hepatitis panel, AMA, GGT, WNL.  RUQ [place] 10/04/23 showed ccy changes, hepatic steatosis.   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 [name]   Lipoma    left upper arm   Mixed hyperlipidemia    Osteoporosis    Pericardial effusion    a. HCAP complicated by pericardial effusion requiring pericardial window 10/2016 and large L pleural effusion requring VATS.   Pleural effusion    a. s/p VATS 2017.   Pneumonia 10/2016   PONV (postoperative nausea and vomiting)    history of   Prolonged QT interval    Urine incontinence    UTI (lower urinary tract infection)    frequent   Vitamin D deficiency     Past Surgical History:  Procedure Laterality Date   BIOPSY  03/20/2022   Procedure: BIOPSY;  Surgeon: Lanelle Bal, DO;  [facility];  Service: Endoscopy;;  gastric lesion vs. gastric ulcer   BIOPSY  06/28/2022   Procedure: BIOPSY;  Surgeon: Lanelle Bal, DO;  [facility];  Service: Endoscopy;;  BIOPSY  05/10/2023   Procedure: BIOPSY;  Surgeon: Lanelle Bal, DO;  [facility];  Service: Endoscopy;;   BRAVO PH STUDY  12/04/2012   Procedure: BRAVO PH STUDY;  Surgeon: West Bali, MD;  [facility];  Service: Endoscopy;  Laterality: N/A;   CHOLECYSTECTOMY N/A 01/03/2014   Procedure: LAPAROSCOPIC CHOLECYSTECTOMY WITH INTRAOPERATIVE CHOLANGIOGRAM;  Surgeon: Clovis Pu. Cornett, MD;  Location: [facility]  [place];  Service: General;  Laterality: N/A;   COLONOSCOPY  01/2004   DR [facility], few small tics   COLONOSCOPY WITH PROPOFOL N/A 05/10/2023   Procedure: COLONOSCOPY WITH PROPOFOL;  Surgeon: Lanelle Bal, DO;  [facility];  Service: Endoscopy;  Laterality: N/A;  1245pm, asa 3   ESOPHAGOGASTRODUODENOSCOPY  02/2010   gastric ulcers   ESOPHAGOGASTRODUODENOSCOPY  12/04/2012   [id number] gastritis (inflammation) was found in the gastric antrum; multiple biopsies The duodenal mucosa showed no abnormalities in the bulb and second portion of the duodenum   ESOPHAGOGASTRODUODENOSCOPY (EGD) WITH PROPOFOL N/A 03/20/2022   Procedure: ESOPHAGOGASTRODUODENOSCOPY (EGD) WITH PROPOFOL;  Surgeon: Lanelle Bal, DO;  [facility];  Service: Endoscopy;  Laterality: N/A;   ESOPHAGOGASTRODUODENOSCOPY (EGD) WITH PROPOFOL N/A 06/28/2022   Procedure: ESOPHAGOGASTRODUODENOSCOPY (EGD) WITH PROPOFOL;  Surgeon: Lanelle Bal, DO;  [facility];  Service: Endoscopy;  Laterality: N/A;  800   ESOPHAGOGASTRODUODENOSCOPY (EGD) WITH PROPOFOL N/A 05/10/2023   Procedure: ESOPHAGOGASTRODUODENOSCOPY (EGD) WITH PROPOFOL;  Surgeon: Lanelle Bal, DO;  [facility];  Service: Endoscopy;  Laterality: N/A;   JOINT REPLACEMENT Right 10/22/2019   KYPHOPLASTY N/A 02/14/2019   Procedure: KYPHOPLASTY T12 and L1;  Surgeon: [name], MD;  [facility];  Service: Orthopedics;  Laterality: N/A;  120 mins   LASIK     LIPOMA EXCISION  08/02/2011   left shoulder   NOSE SURGERY     PARTIAL HYSTERECTOMY     vaginal at age 34 years of age   POLYPECTOMY  05/10/2023   Procedure: POLYPECTOMY;  Surgeon: Lanelle Bal, DO;  [facility];  Service: Endoscopy;;   TOE DEBRIDEMENT Left 1962   lt great toe   TOTAL HIP ARTHROPLASTY Right 11/18/2021   Procedure: TOTAL HIP ARTHROPLASTY ANTERIOR APPROACH;  Surgeon: Ollen Gross, MD;  Location: [facility];  Service:  Orthopedics;  Laterality: Right;   TOTAL HIP ARTHROPLASTY Left 06/29/2023   Procedure: LEFT TOTAL HIP ARTHROPLASTY ANTERIOR APPROACH;  Surgeon: Ollen Gross, MD;  Location: [facility];  Service: Orthopedics;  Laterality: Left;   TOTAL KNEE ARTHROPLASTY Right 10/22/2019   Procedure: TOTAL KNEE ARTHROPLASTY;  Surgeon: Ollen Gross, MD;  Location: [facility];  Service: Orthopedics;  Laterality: Right;    TOTAL KNEE ARTHROPLASTY Left 03/03/2020   Procedure: TOTAL KNEE ARTHROPLASTY;  Surgeon: Ollen Gross, MD;  Location: [facility];  Service: Orthopedics;  Laterality: Left;    TRANSFORAMINAL LUMBAR INTERBODY FUSION (TLIF) WITH PEDICLE SCREW FIXATION 1 LEVEL N/A 01/28/2021   Procedure: TRANSFORAMINAL LUMBAR INTERBODY FUSION (TLIF) LUMBAR FIVE-SACRAL ONE;  Surgeon: [name], MD;  [facility];  Service: Orthopedics;  Laterality: N/A;  4 hrs   VIDEO ASSISTED THORACOSCOPY (VATS)/EMPYEMA Left 10/25/2016   Procedure: VIDEO ASSISTED THORACOSCOPY (VATS), BRONCH,DRAINAGE OF PLEURAL EFFUSION,PERICARDIAL WINDOW WITH DRAINAGE OF PERICARDIAL FLUID, TEE;  Surgeon: Loreli Slot, MD;  Location[facility];  Service: Thoracic;  Laterality: Left;   VIDEO BRONCHOSCOPY N/A 10/25/2016   Procedure: VIDEO BRONCHOSCOPY;  Surgeon: Loreli Slot, MD;  Location[facility];  Service: Thoracic;  Laterality: N/A;    Current Outpatient Medications  Medication Sig Dispense Refill   cetirizine (ZYRTEC) 10 MG tablet Take 10 mg by mouth daily.     citalopram (CELEXA) 20 MG tablet TAKE 1 TABLET BY MOUTH EVERY DAY 90 tablet 0   lactose free nutrition (BOOST) LIQD Take 237 mLs by mouth daily.     levothyroxine (SYNTHROID) 88 MCG tablet TAKE 1 TABLET BY MOUTH EVERY DAY BEFORE BREAKFAST 90 tablet 0   nilotinib (TASIGNA) 150 MG capsule Take 2 capsules (300 mg total) by mouth every 12 (twelve) hours. 120 capsule 2   Probiotic Product (DIGESTIVE ADV PREBIOT+PROBIOT) CHEW Chew 2 each by mouth daily.     rosuvastatin  (CRESTOR) 5 MG tablet Take 1 tablet (5 mg total) by mouth at bedtime. 90 tablet 1   No current facility-administered medications for this visit.    Allergies as of 12/01/2023 - Review Complete 12/01/2023  Allergen Reaction Noted   Nucynta [tapentadol hcl] Swelling 12/31/2020   Codeine Nausea Only and Other (See Comments) 02/11/2010   Sulfonamide derivatives Nausea Only and Other (See Comments) 02/11/2010   Nexium [esomeprazole magnesium] Nausea Only 06/23/2022   Sulfa antibiotics Nausea Only 06/22/2022   Fluconazole Rash 12/10/2015    Family History  Problem Relation Age of Onset   Lung cancer Father    COPD Mother    Stroke Mother    Thyroid disease Mother    Colon cancer Neg Hx    Anesthesia problems Neg Hx    Hypotension Neg Hx    Malignant hyperthermia Neg Hx    Pseudochol deficiency Neg Hx     Social History   Socioeconomic History   Marital status: Married    Spouse name: Greggory Stallion   Number of children: 2   Years of education: 12th grade   Highest education level: Not on file  Occupational History   Occupation: retired    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   2 granddaughters.   Retired HR.    Social Drivers of Corporate investment banker Strain: Low Risk  (05/23/2023)   Overall Financial Resource Strain (CARDIA)    Difficulty of Paying Living Expenses: Not hard at all  Food Insecurity: No Food Insecurity (06/29/2023)   Hunger Vital Sign    Worried About Running Out of Food in the Last Year: Never true    Ran Out of Food in the Last Year: Never true   Transportation Needs: No Transportation Needs (06/29/2023)   PRAPARE - Administrator, Civil Service (Medical): No    Lack of Transportation (Non-Medical): No  Physical Activity: Insufficiently Active (05/23/2023)   Exercise Vital Sign    Days of Exercise per Week: 3 days    Minutes of Exercise per Session: 30 min  Stress: No Stress Concern Present (05/23/2023)   [employer] of Occupational Health - Occupational Stress Questionnaire    Feeling of Stress : Not at all  Social Connections: Moderately Integrated (05/23/2023)   Social Connection and Isolation Panel [NHANES]    Frequency of Communication with Friends and Family: More than three times a week    Frequency of Social Gatherings with Friends and Family: More than three times a week    Attends Religious Services: More than 4 times per year    Active Member of [employer] or Organizations: No    Attends Banker Meetings: Never    Marital Status: Married    Subjective: Review of Systems  Constitutional:  Negative for chills and fever.  HENT:  Negative for congestion and hearing loss.   Eyes:  Negative for blurred vision and double vision.  Respiratory:  Negative for cough and shortness of breath.   Cardiovascular:  Negative for chest pain and palpitations.  Gastrointestinal:  Positive for abdominal pain. Negative for blood in stool, constipation, diarrhea, heartburn, melena and vomiting.  Genitourinary:  Negative for dysuria and urgency.  Musculoskeletal:  Negative for joint pain and myalgias.  Skin:  Negative for itching and rash.  Neurological:  Negative for dizziness and headaches.  Psychiatric/Behavioral:  Negative for depression. The patient is not nervous/anxious.      Objective: BP 126/73   Pulse 73   Temp 98.2 F (36.8 C)   Ht 5\' 5"  (1.651 m)   Wt 214 lb 3.2 oz (97.2 kg)   BMI 35.64 kg/m  Physical Exam Constitutional:      Appearance: Normal appearance.  HENT:     Head: Normocephalic  and atraumatic.  Eyes:     Extraocular Movements: Extraocular movements intact.     Conjunctiva/sclera: Conjunctivae normal.  Cardiovascular:     Rate and Rhythm: Normal rate and regular rhythm.  Pulmonary:     Effort: Pulmonary effort is normal.     Breath sounds: Normal breath sounds.  Abdominal:     General: Bowel sounds are normal.     Palpations: Abdomen is soft.  Musculoskeletal:        General: No swelling. Normal range of motion.     Cervical back: Normal range of motion and neck supple.  Skin:    General: Skin is warm and dry.     Coloration: Skin is not jaundiced.  Neurological:     General: No focal deficit present.     Mental Status: She is alert and oriented to person, place, and time.  Psychiatric:        Mood and Affect: Mood normal.        Behavior: Behavior normal.      Assessment/Plan:  1.  Abdominal-new right lower quadrant, moderate to severe.  Associated nausea and vomiting.  States she was up all night crying due to abdominal pain.  Has been going on for over 1 week.  Tender on exam today.  Will order stat CT abdomen pelvis with IV contrast to further evaluate.  Call with results.  2.  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 Low fat/cholesterol diet.   Avoid sweets, sodas, fruit juices, sweetened beverages like tea, etc. Gradually increase exercise from 15 min daily up to 1 hr per day 5 days/week. Limit alcohol use.  3.  Chronically elevated alk phos-etiology unclear, AMA, GGT, viral hepatitis negative. Does note chronic itching related to allergies which is well-controlled on cetirizine.  Status post cholecystectomy.  CT imaging showed mild biliary dilatation likely  related to postcholecystectomy changes. Consider alk phos isoenzymes on follow-up visit.  Possibly coming from bone?  Possibly related to CML?   Further  recommendations after CT reviewed.   12/01/2023 2:12 PM   Disclaimer: This note was dictated with voice recognition software. Similar sounding words can inadvertently be transcribed and may not be corrected upon review.

## 2023-12-15 ENCOUNTER — Other Ambulatory Visit: Payer: PPO

## 2023-12-22 ENCOUNTER — Ambulatory Visit: Payer: PPO | Admitting: Hematology

## 2023-12-26 ENCOUNTER — Ambulatory Visit: Payer: Self-pay | Admitting: Family Medicine

## 2023-12-26 NOTE — Telephone Encounter (Signed)
 Copied from CRM [id number]. Topic: Clinical - Red Word Triage >> Dec 26, 2023  9:36 AM Laurier BROCKS wrote: Red Word that prompted transfer to Nurse Triage: Pssobile sinus infection. Patient has been taking OTC meds and has been blowing blood out of her nose. It started last week as a sinus problem. She has also broken out into some hives with stinging pains in her arms.   Chief Complaint: Multiple complaints--Sinus pressure and pain, blistering rash on left arm different sizes and stinging sensation left shoulder and under left arm area, blowing blood out of her nose(not continuously bleeding just when she blows her nose) Symptoms: Sinus pressure/pain, headache, blowing blood out of nose (not continuously bleeding),  Frequency: x 3 days Pertinent Negatives: Patient denies nausea, vomiting, diarrhea, fevers, difficulty breathing, sore throat Disposition: [x] ED /[] Urgent Care (no appt availability in office) / [] Appointment(In office/virtual)/ []  [facility]/ [] Home Care/ [x] Refused Recommended Disposition /[] [facility]/ []  Follow-up with PCP Additional Notes: Patient called and advised that her symptoms started last week for a sinus issue--patient states she gets sinus infections.  She said that now she is blowing out a lot of blood in her nose.  She said that now she is having a rash on her left arm and has hives.  She states that now from her shoulder down under her arm she is having a stinging pain with blisters.  She states that this area has been there since Friday (3 days).  Patient states that she has had blisters with an allergy to a medication in the past and these blisters she is unsure exactly what caused them.  She has tried some over the counter medications and is unsure which one may have caused this issue.  Patient also states that it has been going on for 3 days.  She denies nausea, vomiting, diarrhea, difficulty breathing, fevers, sore throat.  She denies any trouble  breathing other than some sinus congestion.  Patient states that her head and sinuses hurt from sinus pressure/pain and she believes she also has a sinus infection.  Given that this patient has a rash/blistering area of unknown origin and stinging/burning sensation around her left shoulder and under her left arm, and having symptoms of a sinus infection---patient is advised that she needs to be seen as soon as possible to take care of these issues.  No immediate appointments are available.  Patient is advised that the Emergency Room is recommended due to her multiple complaints of unknown origins that have been going on for 3 days.  Patient asked about urgent care and said there is one nearby but she only wants to see her PCP.   She asked if she could be worked in today and get an antibiotic.  Patient is advised that I will send a message to her PCP and it is still recommended that she go get checked out asap either at the ER or an urgent care and they deem it necessary they will send her to the ER.  Patient verbalized understanding and said she just wants to see her PCP.  Reason for Disposition  Patient sounds very sick or weak to the triager  Answer Assessment - Initial Assessment Questions 1. APPEARANCE of RASH: Describe the rash.      blisters 2. LOCATION: Where is the rash located?      Left arm 3. NUMBER: How many spots are there?      A bunch of different ones 4. SIZE: How big  are the spots? (Inches, centimeters or compare to size of a coin)      Different sizes 5. ONSET: When did the rash start?      3 days ago 6. ITCHING: Does the rash itch? If Yes, ask: How bad is the itch?  (Scale 0-10; or none, mild, moderate, severe)     no 7. PAIN: Does the rash hurt? If Yes, ask: How bad is the pain?  (Scale 0-10; or none, mild, moderate, severe)    - NONE (0): no pain    - MILD (1-3): doesn't interfere with normal activities     - MODERATE (4-7): interferes with normal  activities or awakens from sleep     - SEVERE (8-10): excruciating pain, unable to do any normal activities     8 left shoulder and 8 in head for sinus pain 8. OTHER SYMPTOMS: Do you have any other symptoms? (e.g., fever)     Sinus pain, headache  Answer Assessment - Initial Assessment Questions 1. APPEARANCE of RASH: Describe the rash. (e.g., spots, blisters, raised areas, skin peeling, scaly)     blisters 2. SIZE: How big are the spots? (e.g., tip of pen, eraser, coin; inches, centimeters)     Different sizes---some tiny and some the size of an eraser----mostly in the bend of the left arm 3. LOCATION: Where is the rash located?     Left arm--pain around left shoulder---and under left arm 4. COLOR: What color is the rash? (Note: It is difficult to assess rash color in people with darker-colored skin. When this situation occurs, simply ask the caller to describe what they see.)     Red where the spots are at 5. ONSET: When did the rash begin?     Friday (3 days) 6. FEVER: Do you have a fever? If Yes, ask: What is your temperature, how was it measured, and when did it start?     No 7. ITCHING: Does the rash itch? If Yes, ask: How bad is the itch? (Scale 1-10; or mild, moderate, severe)     No itching---just burning 8. CAUSE: What do you think is causing the rash?     Possibly a medication 9. NEW MEDICINES: What new medicines are you taking? (e.g., name of antibiotic) When did you start taking this medication?SABRA     Unknown exactly 10. OTHER SYMPTOMS: Do you have any other symptoms? (e.g., sore throat, fever, joint pain)       Dry cough, blowing blood out of nose  Protocols used: Rash - Widespread On Drugs-A-AH, Rash or Redness - Localized-A-AH

## 2023-12-26 NOTE — Telephone Encounter (Signed)
 Patient already has an appointment scheduled in the AM. Spoke with patient

## 2023-12-27 ENCOUNTER — Encounter: Payer: Self-pay | Admitting: Family Medicine

## 2023-12-27 ENCOUNTER — Ambulatory Visit (INDEPENDENT_AMBULATORY_CARE_PROVIDER_SITE_OTHER): Payer: PPO | Admitting: Family Medicine

## 2023-12-27 VITALS — BP 142/77 | HR 90 | Temp 91.0°F | Ht 65.0 in | Wt 207.0 lb

## 2023-12-27 DIAGNOSIS — R0981 Nasal congestion: Secondary | ICD-10-CM

## 2023-12-27 DIAGNOSIS — B029 Zoster without complications: Secondary | ICD-10-CM

## 2023-12-27 MED ORDER — FLUTICASONE PROPIONATE 50 MCG/ACT NA SUSP: 2.0000 | Freq: Every day | NASAL | 6 refills | Status: DC

## 2023-12-27 MED ORDER — VALACYCLOVIR HCL 1 G PO TABS: 1000.0000 mg | ORAL_TABLET | Freq: Three times a day (TID) | ORAL | 0 refills | Status: AC

## 2023-12-27 NOTE — Progress Notes (Signed)
 Subjective:  Patient ID: Andrea Martinez, female    DOB: October 18, 1946, 78 y.o.   MRN: [id number]  Patient Care Team: Severa Rock HERO, FNP as PCP - General (Family Medicine) Rogers Hai, MD as Medical Oncologist (Medical Oncology) Lenis Ethelle ORN, MD as Consulting Physician (Endocrinology) Burnetta Aures, MD as Consulting Physician (Orthopedic Surgery)   Chief Complaint:  Cough, Nasal Congestion (X 1 week- otc not helping ), and Rash (Left arm x 1 week )   HPI: Andrea Martinez is a 78 y.o. female presenting on 12/27/2023 for Cough, Nasal Congestion (X 1 week- otc not helping ), and Rash (Left arm x 1 week )   Discussed the use of AI scribe software for clinical note transcription with the patient, who gave verbal consent to proceed.  History of Present Illness   The patient, with a history of leukemia and recent hip replacement, presents with a new rash that appeared four days ago. The rash, characterized by small blisters, is localized to one arm and is associated with significant pain radiating from the top of the shoulder to the bottom. The patient initially thought the rash was due to an insect bite. The patient also reports nasal congestion and epistaxis, which she has been managing with a humidifier and over-the-counter sinus medication. She has not taken any medication for these symptoms in the past three to four days, currently only taking Tylenol  for pain.  The patient also reports a stinging sensation in the area of her recent hip replacement, which she attributes to nerve regrowth. She has been experiencing significant distress due to her health issues, including anxiety, and is considering a change in her leukemia treatment plan. She has been experiencing excessive sweating as a side effect of her current chemotherapy medication.  The patient has been avoiding social contact due to her health issues and has not been anywhere for the last two weeks. She reports that her  teeth have been hurting due to sinus drainage and has dental work planned for two crowns and a small decay on a front tooth. The patient expresses frustration with her ongoing health issues and frequent medical appointments.          Relevant past medical, surgical, family, and social history reviewed and updated as indicated.  Allergies and medications reviewed and updated. Data reviewed: Chart in Epic.   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 Mirna   Lipoma    left upper arm   Mixed hyperlipidemia    Osteoporosis    Pericardial effusion    a. HCAP complicated by pericardial effusion requiring pericardial window 10/2016 and large L pleural effusion requring VATS.   Pleural effusion    a. s/p VATS 2017.   Pneumonia 10/2016   PONV (postoperative nausea and vomiting)    history of   Prolonged QT interval    Urine incontinence    UTI (lower urinary tract infection)    frequent   Vitamin D  deficiency     Past Surgical History:  Procedure Laterality Date   BIOPSY  03/20/2022   Procedure: BIOPSY;  Surgeon: Cindie Carlin POUR, DO;  [facility]: [facility];  Service: Endoscopy;;  gastric lesion vs. gastric ulcer  BIOPSY  06/28/2022   Procedure: BIOPSY;  Surgeon: Cindie Carlin POUR, DO;  [facility]: [facility];  Service: Endoscopy;;   BIOPSY  05/10/2023   Procedure: BIOPSY;  Surgeon: Cindie Carlin POUR, DO;  [facility];  Service: Endoscopy;;   BRAVO PH STUDY  12/04/2012   Procedure: BRAVO PH STUDY;  Surgeon: Margo LITTIE Haddock, MD;  [facility]: [facility];  Service: Endoscopy;  Laterality: N/A;   CHOLECYSTECTOMY N/A 01/03/2014   Procedure: LAPAROSCOPIC CHOLECYSTECTOMY WITH INTRAOPERATIVE CHOLANGIOGRAM;  Surgeon: Debby LABOR. Cornett, MD;  Location:  [facility];  Service: General;  Laterality: N/A;   COLONOSCOPY  01/2004   DR [facility], few small tics   COLONOSCOPY WITH PROPOFOL  N/A 05/10/2023   Procedure: COLONOSCOPY WITH PROPOFOL ;  Surgeon: Cindie Carlin POUR, DO;  [facility];  Service: Endoscopy;  Laterality: N/A;  1245pm, asa 3   ESOPHAGOGASTRODUODENOSCOPY  02/2010   gastric ulcers   ESOPHAGOGASTRODUODENOSCOPY  12/04/2012   DOQ:[id number] gastritis (inflammation) was found in the gastric antrum; multiple biopsies The duodenal mucosa showed no abnormalities in the bulb and second portion of the duodenum   ESOPHAGOGASTRODUODENOSCOPY (EGD) WITH PROPOFOL  N/A 03/20/2022   Procedure: ESOPHAGOGASTRODUODENOSCOPY (EGD) WITH PROPOFOL ;  Surgeon: Cindie Carlin POUR, DO;  [facility];  Service: Endoscopy;  Laterality: N/A;   ESOPHAGOGASTRODUODENOSCOPY (EGD) WITH PROPOFOL  N/A 06/28/2022   Procedure: ESOPHAGOGASTRODUODENOSCOPY (EGD) WITH PROPOFOL ;  Surgeon: Cindie Carlin POUR, DO;  [facility];  Service: Endoscopy;  Laterality: N/A;  800   ESOPHAGOGASTRODUODENOSCOPY (EGD) WITH PROPOFOL  N/A 05/10/2023   Procedure: ESOPHAGOGASTRODUODENOSCOPY (EGD) WITH PROPOFOL ;  Surgeon: Cindie Carlin POUR, DO;  [facility];  Service: Endoscopy;  Laterality: N/A;   JOINT REPLACEMENT Right 10/22/2019   KYPHOPLASTY N/A 02/14/2019   Procedure: KYPHOPLASTY T12 and L1;  Surgeon: Burnetta Aures, MD;  Location: [facility];  Service: Orthopedics;  Laterality: N/A;  120 mins   LASIK     LIPOMA EXCISION  08/02/2011   left shoulder   NOSE SURGERY     PARTIAL HYSTERECTOMY     vaginal at age 39 years of age   POLYPECTOMY  05/10/2023   Procedure: POLYPECTOMY;  Surgeon: Cindie Carlin POUR, DO;  [facility];  Service: Endoscopy;;   TOE DEBRIDEMENT Left 1962   lt great toe   TOTAL HIP ARTHROPLASTY Right 11/18/2021   Procedure: TOTAL HIP ARTHROPLASTY ANTERIOR APPROACH;  Surgeon: Melodi Lerner, MD;  Location: [facility];   Service: Orthopedics;  Laterality: Right;   TOTAL HIP ARTHROPLASTY Left 06/29/2023   Procedure: LEFT TOTAL HIP ARTHROPLASTY ANTERIOR APPROACH;  Surgeon: Melodi Lerner, MD;  Location: [facility];  Service: Orthopedics;  Laterality: Left;   TOTAL KNEE ARTHROPLASTY Right 10/22/2019   Procedure: TOTAL KNEE ARTHROPLASTY;  Surgeon: Melodi Lerner, MD;  Location: [facility];  Service: Orthopedics;  Laterality: Right;    TOTAL KNEE ARTHROPLASTY Left 03/03/2020   Procedure: TOTAL KNEE ARTHROPLASTY;  Surgeon: Melodi Lerner, MD;  Location: [facility];  Service: Orthopedics;  Laterality: Left;    TRANSFORAMINAL LUMBAR INTERBODY FUSION (TLIF) WITH PEDICLE SCREW FIXATION 1 LEVEL N/A 01/28/2021   Procedure: TRANSFORAMINAL LUMBAR INTERBODY FUSION (TLIF) LUMBAR FIVE-SACRAL ONE;  Surgeon: Burnetta Aures, MD;  Location: [facility];  Service: Orthopedics;  Laterality: N/A;  4 hrs   VIDEO ASSISTED THORACOSCOPY (VATS)/EMPYEMA Left 10/25/2016   Procedure: VIDEO ASSISTED THORACOSCOPY (VATS), BRONCH,DRAINAGE OF PLEURAL EFFUSION,PERICARDIAL WINDOW WITH DRAINAGE OF PERICARDIAL FLUID, TEE;  Surgeon: Elspeth JAYSON Millers, MD;  Location: [facility];  Service: Thoracic;  Laterality: Left;   VIDEO BRONCHOSCOPY N/A 10/25/2016   Procedure: VIDEO BRONCHOSCOPY;  Surgeon: Elspeth JAYSON Millers, MD;  Location: [facility];  Service: Thoracic;  Laterality: N/A;    Social History   Socioeconomic History   Marital status: Married    Spouse name: Zachary   Number of children: 2   Years of education: 12th grade   Highest education level: Not on file  Occupational History   Occupation: retired    Associate Professor: UENPLOYED    Employer: RETIRED  Tobacco Use   Smoking status: Never    Passive exposure: Never   Smokeless tobacco: Never  Vaping Use   Vaping status: Never Used  Substance and Sexual Activity   Alcohol use: No    Alcohol/week: 0.0 standard drinks of alcohol   Drug use: No   Sexual activity: Yes    Partners: Male    Birth  control/protection: None  Other Topics Concern   Not on file  Social History Narrative   1 son, 1 daughter.   2 granddaughters.   Retired HR.    Social Drivers of Corporate Investment Banker Strain: Low Risk  (05/23/2023)   Overall Financial Resource Strain (CARDIA)    Difficulty of Paying Living Expenses: Not hard at all  Food Insecurity: No Food Insecurity (06/29/2023)   Hunger Vital Sign    Worried About Running Out of Food in the Last Year: Never true    Ran Out of Food in the Last Year: Never true  Transportation Needs: No Transportation Needs (06/29/2023)   PRAPARE - Administrator, Civil Service (Medical): No    Lack of Transportation (Non-Medical): No  Physical Activity: Insufficiently Active (05/23/2023)   Exercise Vital Sign    Days of Exercise per Week: 3 days    Minutes of Exercise per Session: 30 min  Stress: No Stress Concern Present (05/23/2023)   [employer] of Occupational Health - Occupational Stress Questionnaire    Feeling of Stress : Not at all  Social Connections: Moderately Integrated (05/23/2023)   Social Connection and Isolation Panel [NHANES]    Frequency of Communication with Friends and Family: More than three times a week    Frequency of Social Gatherings with Friends and Family: More than three times a week    Attends Religious Services: More than 4 times per year    Active Member of [employer] or Organizations: No    Attends Banker Meetings: Never    Marital Status: Married  Catering Manager Violence: Not At Risk (06/29/2023)   Humiliation, Afraid, Rape, and Kick questionnaire    Fear of Current or Ex-Partner: No    Emotionally Abused: No    Physically Abused: No    Sexually Abused: No    Outpatient Encounter Medications as of 12/27/2023  Medication Sig   cetirizine (ZYRTEC) 10 MG tablet Take 10 mg by mouth daily.   citalopram  (CELEXA ) 20 MG tablet TAKE 1 TABLET BY MOUTH EVERY DAY   fluticasone  (FLONASE ) 50 MCG/ACT nasal  spray Place 2 sprays into both nostrils daily.   lactose free nutrition (BOOST) LIQD Take 237 mLs by mouth daily.   levothyroxine  (SYNTHROID ) 88 MCG tablet TAKE 1 TABLET BY MOUTH EVERY DAY BEFORE BREAKFAST   nilotinib  (TASIGNA ) 150 MG capsule Take 2 capsules (300 mg total) by mouth every 12 (twelve) hours.   Probiotic Product (DIGESTIVE ADV PREBIOT+PROBIOT) CHEW Chew 2 each by mouth daily.   rosuvastatin  (CRESTOR ) 5 MG tablet Take  1 tablet (5 mg total) by mouth at bedtime.   valACYclovir  (VALTREX ) 1000 MG tablet Take 1 tablet (1,000 mg total) by mouth 3 (three) times daily for 10 days.   No facility-administered encounter medications on file as of 12/27/2023.    Allergies  Allergen Reactions   Nucynta [Tapentadol Hcl] Swelling   Codeine Nausea Only and Other (See Comments)    Headache   Sulfonamide Derivatives Nausea Only and Other (See Comments)    Headache   Nexium [Esomeprazole Magnesium ] Nausea Only   Sulfa Antibiotics Nausea Only    Headache    Fluconazole Rash    Redness and blistering on left thigh    Pertinent ROS per HPI, otherwise unremarkable      Objective:  BP (!) 142/77   Pulse 90   Temp (!) 91 F (32.8 C)   Ht 5' 5 (1.651 m)   Wt 207 lb (93.9 kg)   SpO2 99%   BMI 34.45 kg/m    Wt Readings from Last 3 Encounters:  12/27/23 207 lb (93.9 kg)  12/01/23 214 lb 3.2 oz (97.2 kg)  11/30/23 210 lb (95.3 kg)    Physical Exam Vitals and nursing note reviewed.  Constitutional:      General: She is not in acute distress.    Appearance: Normal appearance. She is obese. She is not ill-appearing (chronically ill), toxic-appearing or diaphoretic.  HENT:     Head: Normocephalic and atraumatic.     Right Ear: Tympanic membrane, ear canal and external ear normal.     Left Ear: Tympanic membrane, ear canal and external ear normal.     Nose: Congestion and rhinorrhea present.     Mouth/Throat:     Mouth: Mucous membranes are moist.     Pharynx: Oropharynx is clear.  Posterior oropharyngeal erythema present. No oropharyngeal exudate.  Eyes:     Conjunctiva/sclera: Conjunctivae normal.     Pupils: Pupils are equal, round, and reactive to light.  Cardiovascular:     Rate and Rhythm: Normal rate and regular rhythm.     Heart sounds: Normal heart sounds.  Pulmonary:     Effort: Pulmonary effort is normal.     Breath sounds: Normal breath sounds.  Musculoskeletal:     Cervical back: Neck supple.     Right lower leg: No edema.     Left lower leg: No edema.  Lymphadenopathy:     Cervical: No cervical adenopathy.  Skin:    General: Skin is warm and dry.     Capillary Refill: Capillary refill takes less than 2 seconds.     Findings: Erythema and rash present. Rash is papular and vesicular.       Neurological:     General: No focal deficit present.     Mental Status: She is alert.  Psychiatric:        Attention and Perception: Attention and perception normal.        Mood and Affect: Mood is anxious. Affect is tearful.      Results for orders placed or performed in visit on 11/21/23  BCR-ABL1, CML/ALL, PCR, QUANT   Collection Time: 11/21/23  1:49 PM  Result Value Ref Range   b2a2 transcript <0.0032 % %   b3a2 transcript <0.0032 % %   E1A2 Transcript <0.0032 % %   Interpretation (BCRAL): Negative    Director Review [facility]): Comment    Background: Comment    Methodology Comment   Lactate dehydrogenase   Collection Time: 11/21/23  1:49  PM  Result Value Ref Range   LDH 147 98 - 192 U/L  Comprehensive metabolic panel   Collection Time: 11/21/23  1:49 PM  Result Value Ref Range   Sodium 141 135 - 145 mmol/L   Potassium 5.0 3.5 - 5.1 mmol/L   Chloride 106 98 - 111 mmol/L   CO2 24 22 - 32 mmol/L   Glucose, Bld 99 70 - 99 mg/dL   BUN 29 (H) 8 - 23 mg/dL   Creatinine, Ser 8.89 (H) 0.44 - 1.00 mg/dL   Calcium  9.6 8.9 - 10.3 mg/dL   Total Protein 8.1 6.5 - 8.1 g/dL   Albumin 4.1 3.5 - 5.0 g/dL   AST 34 15 - 41 U/L   ALT 22 0 - 44 U/L    Alkaline Phosphatase 151 (H) 38 - 126 U/L   Total Bilirubin 0.6 <1.2 mg/dL   GFR, Estimated 51 (L) >60 mL/min   Anion gap 11 5 - 15  Iron and TIBC   Collection Time: 11/21/23  1:49 PM  Result Value Ref Range   Iron 33 28 - 170 ug/dL   TIBC 688 749 - 549 ug/dL   Saturation Ratios 11 10.4 - 31.8 %   UIBC 278 ug/dL  Ferritin   Collection Time: 11/21/23  1:49 PM  Result Value Ref Range   Ferritin 197 11 - 307 ng/mL  CBC with Differential/Platelet   Collection Time: 11/21/23  1:49 PM  Result Value Ref Range   WBC 12.8 (H) 4.0 - 10.5 K/uL   RBC 4.26 3.87 - 5.11 MIL/uL   Hemoglobin 12.2 12.0 - 15.0 g/dL   HCT 61.3 63.9 - 53.9 %   MCV 90.6 80.0 - 100.0 fL   MCH 28.6 26.0 - 34.0 pg   MCHC 31.6 30.0 - 36.0 g/dL   RDW 85.6 88.4 - 84.4 %   Platelets 311 150 - 400 K/uL   nRBC 0.0 0.0 - 0.2 %   Neutrophils Relative % 74 %   Neutro Abs 9.4 (H) 1.7 - 7.7 K/uL   Lymphocytes Relative 16 %   Lymphs Abs 2.1 0.7 - 4.0 K/uL   Monocytes Relative 7 %   Monocytes Absolute 0.9 0.1 - 1.0 K/uL   Eosinophils Relative 3 %   Eosinophils Absolute 0.4 0.0 - 0.5 K/uL   Basophils Relative 0 %   Basophils Absolute 0.1 0.0 - 0.1 K/uL   Immature Granulocytes 0 %   Abs Immature Granulocytes 0.05 0.00 - 0.07 K/uL       Pertinent labs & imaging results that were available during my care of the patient were reviewed by me and considered in my medical decision making.  Assessment & Plan:  Ronal Ronal Fees was seen today for cough, nasal congestion and rash.  Diagnoses and all orders for this visit:  Herpes zoster without complication -     valACYclovir  (VALTREX ) 1000 MG tablet; Take 1 tablet (1,000 mg total) by mouth 3 (three) times daily for 10 days.  Nasal congestion -     fluticasone  (FLONASE ) 50 MCG/ACT nasal spray; Place 2 sprays into both nostrils daily.     Assessment and Plan    Herpes Zoster (Shingles) Herpes zoster rash on the right arm with neuropathic pain from shoulder to lower arm,  onset four days ago. Severe pain and blisters noted. Advised on contagious nature until blisters dry up, avoiding contact with children and others. Discussed virus reactivation due to stress or immunocompromised states. Informed to avoid scratching and  keep blisters covered. Shingles vaccination to be considered 3-6 months post-recovery. - Prescribe Valtrex  (valacyclovir ) 3 times daily for 10 days - Advise to keep rash covered and avoid scratching - Provide education on shingles and its contagious nature - Schedule follow-up in 2-3 weeks to assess progress  Nasal Congestion Persistent nasal congestion with epistaxis, unrelieved by Mucinex  and OTC sinus medications. Symptoms include nasal dryness and sinus drainage, particularly on the left side, causing dental pain. Advised use of Flonase  and a humidifier. - Prescribe Flonase  every morning for 10 days - Advise use of a humidifier to alleviate nasal dryness  Neuropathic Pain Post-Hip Replacement Neuropathic pain in the area of recent hip replacement surgery (July 2024). Likely due to nerve root regrowth, a common occurrence. - Monitor symptoms and provide supportive care as needed  General Health Maintenance Advised to consider shingles vaccination 3-6 months post-recovery from current shingles outbreak. - Consider shingles vaccination in 3-6 months post-recovery  Follow-up - Schedule follow-up appointment in 2-3 weeks.          Continue all other maintenance medications.  Follow up plan: Return in about 2 weeks (around 01/10/2024), or if symptoms worsen or fail to improve, for herpes zoster.   Continue healthy lifestyle choices, including diet (rich in fruits, vegetables, and lean proteins, and low in salt and simple carbohydrates) and exercise (at least 30 minutes of moderate physical activity daily).  Educational handout given for shingles  The above assessment and management plan was discussed with the patient. The patient  verbalized understanding of and has agreed to the management plan. Patient is aware to call the clinic if they develop any new symptoms or if symptoms persist or worsen. Patient is aware when to return to the clinic for a follow-up visit. Patient educated on when it is appropriate to go to the emergency department.   Rosaline Bruns, FNP-C [facility] [phone]

## 2023-12-30 ENCOUNTER — Ambulatory Visit: Payer: Self-pay | Admitting: Family Medicine

## 2023-12-30 ENCOUNTER — Ambulatory Visit: Payer: PPO | Admitting: Family Medicine

## 2023-12-30 NOTE — Telephone Encounter (Signed)
Copied from CRM [id number]. Topic: Clinical - Medical Advice >> Dec 30, 2023  9:07 AM [name] wrote: Reason for CRM: Patient is in pain from shingles and is taken Tylenol but it is not helping. Patients wants a narcotic but dont want to come in. She wants Nurse to call her asap as what was told by the Pharmacy.    Chief Complaint: pain (from herpes zoster rash R arm)   Symptoms: Pain 8/10 - unable to sleep, burning pain, tearful, causing nausea, rash is itchy  Frequency: constant  Pertinent Negatives: Patient denies fever  Disposition:  [x]  Follow-up with PCP  Additional Notes: Patient reports continued and somewhat worsened pain described as a burning sensation from R shoulder down her arm. States she did not get much sleep last night, she just "cried and cried'. Itch remains but she wears gloves and does well not to scratch. Educated patient on Valtrex medication as she thought it was an antibiotic for her nasal congestion. Pharmacy suggested she reach out to PCP for additional pain management. Patient understands med to be an antiviral for the shingles and not nasal congestion. Blisters/redness remains, denies drainage; states "they have not popped". Patient is aware of f/u appt on Feb 4th. If PCP decides to provide additional medications, patient will be notified via phone. Pt verbalized understanding and agrees.      Reason for Disposition . [1] Shingles rash already diagnosed AND [2] weak immune system (e.g., HIV positive, cancer chemotherapy, chronic steroid treatment, splenectomy) AND [3] taking antiviral medication  Protocols used: Shingles (Zoster)-A-AH

## 2023-12-30 NOTE — Telephone Encounter (Signed)
Patient aware and verbalizes understanding. 

## 2024-01-05 ENCOUNTER — Other Ambulatory Visit: Payer: Self-pay | Admitting: Family Medicine

## 2024-01-05 DIAGNOSIS — F320 Major depressive disorder, single episode, mild: Secondary | ICD-10-CM

## 2024-01-05 DIAGNOSIS — F419 Anxiety disorder, unspecified: Secondary | ICD-10-CM

## 2024-01-11 ENCOUNTER — Encounter: Payer: Self-pay | Admitting: Family Medicine

## 2024-01-11 ENCOUNTER — Ambulatory Visit: Payer: PPO | Admitting: Family Medicine

## 2024-01-11 VITALS — BP 132/74 | HR 90 | Temp 97.0°F | Ht 65.0 in | Wt 210.0 lb

## 2024-01-11 DIAGNOSIS — E039 Hypothyroidism, unspecified: Secondary | ICD-10-CM

## 2024-01-11 DIAGNOSIS — K631 Perforation of intestine (nontraumatic): Secondary | ICD-10-CM

## 2024-01-11 DIAGNOSIS — B029 Zoster without complications: Secondary | ICD-10-CM

## 2024-01-11 DIAGNOSIS — R7989 Other specified abnormal findings of blood chemistry: Secondary | ICD-10-CM

## 2024-01-11 DIAGNOSIS — F320 Major depressive disorder, single episode, mild: Secondary | ICD-10-CM | POA: Diagnosis not present

## 2024-01-11 DIAGNOSIS — F419 Anxiety disorder, unspecified: Secondary | ICD-10-CM | POA: Diagnosis not present

## 2024-01-11 DIAGNOSIS — E559 Vitamin D deficiency, unspecified: Secondary | ICD-10-CM

## 2024-01-11 MED ORDER — GABAPENTIN 100 MG PO CAPS: 100.0000 mg | ORAL_CAPSULE | Freq: Three times a day (TID) | ORAL | 0 refills | Status: DC

## 2024-01-11 MED ORDER — CITALOPRAM HYDROBROMIDE 20 MG PO TABS: 20.0000 mg | ORAL_TABLET | Freq: Every day | ORAL | 0 refills | Status: DC

## 2024-01-11 NOTE — Progress Notes (Signed)
Subjective:  Patient ID: Andrea Martinez, female    DOB: 12-27-1945, 78 y.o.   MRN: [id number]  Patient Care Team: [name], FNP as PCP - General (Family Medicine) [name], MD as Medical Oncologist (Medical Oncology) [name], MD as Consulting Physician (Endocrinology) [name], MD as Consulting Physician (Orthopedic Surgery)   Chief Complaint:  Shoulder Pain (Left shoulder pain. Thumb on that side is numb. Started 3 weeks ago. Recently dx with shingles.  )   HPI: Andrea Martinez is a 78 y.o. female presenting on 01/11/2024 for Shoulder Pain (Left shoulder pain. Thumb on that side is numb. Started 3 weeks ago. Recently dx with shingles.  )   Discussed the use of AI scribe software for clinical note transcription with the patient, who gave verbal consent to proceed.  History of Present Illness   The patient presents with ongoing pain and numbness following a shingles outbreak.  She experiences pain originating from her back and radiating down her arm, with numbness in her thumb. She completed a ten-day course of antiviral medication and has been using an over-the-counter cream and warm soap washes. Despite these measures, she continues to experience discomfort and numbness, which she attributes to the location of the shingles affecting nerve pathways.  She has been experiencing nausea for the past three weeks, which she associates with the antiviral medication. She has been consuming lemon balls to help manage the nausea and prefers to avoid additional medication if possible.  She has a dental issue with broken teeth causing pain. She has been prescribed an antibiotic by her dentist, which she has not yet started due to timing issues with the prescription being called in late.  She uses Tylenol and Advil for pain management, which she finds somewhat helpful. She wants to avoid taking more medication than necessary.  She is concerned about being  contagious but has been reassured that she is not, as the lesions have dried up. She has been avoiding contact with others and has not left her home since the outbreak began.           08/19/2023   11:28 AM 06/22/2023    9:26 AM 05/23/2023    8:31 AM 05/17/2023    9:48 AM 03/10/2023    8:09 AM  Depression screen PHQ 2/9  Decreased Interest 1 1 0 1 1  Down, Depressed, Hopeless 1 1 0 0 1  PHQ - 2 Score 2 2 0 1 2  Altered sleeping 0 0 0 1 2  Tired, decreased energy 1 2 0 2 2  Change in appetite 0 1 0 1 1  Feeling bad or failure about yourself  0 0 0 0 0  Trouble concentrating 0 0 0 0 0  Moving slowly or fidgety/restless 0 0 0 0 0  Suicidal thoughts 0 0 0 0 0  PHQ-9 Score 3 5 0 5 7  Difficult doing work/chores Not difficult at all Not difficult at all Not difficult at all Not difficult at all Not difficult at all      08/19/2023   11:27 AM 06/22/2023    9:25 AM 05/17/2023    9:49 AM 03/10/2023    8:10 AM  GAD 7 : Generalized Anxiety Score  Nervous, Anxious, on Edge 0 0 0 0  Control/stop worrying 0 0 0 0  Worry too much - different things 1 2 1 1   Trouble relaxing 1 0 0 0  Restless 0 0 0  0  Easily annoyed or irritable 0 0 0 0  Afraid - awful might happen 0 0 0 0  Total GAD 7 Score 2 2 1 1   Anxiety Difficulty Not difficult at all  Not difficult at all Not difficult at all       Relevant past medical, surgical, family, and social history reviewed and updated as indicated.  Allergies and medications reviewed and updated. Data reviewed: Chart in Epic.   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 [name]   Lipoma    left upper arm   Mixed hyperlipidemia    Osteoporosis    Pericardial effusion    a. HCAP complicated by pericardial effusion requiring  pericardial window 10/2016 and large L pleural effusion requring VATS.   Pleural effusion    a. s/p VATS 2017.   Pneumonia 10/2016   PONV (postoperative nausea and vomiting)    history of   Prolonged QT interval    Urine incontinence    UTI (lower urinary tract infection)    frequent   Vitamin D deficiency     Past Surgical History:  Procedure Laterality Date   BIOPSY  03/20/2022   Procedure: BIOPSY;  Surgeon: Lanelle Bal, DO;  [facility];  Service: Endoscopy;;  gastric lesion vs. gastric ulcer   BIOPSY  06/28/2022   Procedure: BIOPSY;  Surgeon: Lanelle Bal, DO;  [facility];  Service: Endoscopy;;   BIOPSY  05/10/2023   Procedure: BIOPSY;  Surgeon: Lanelle Bal, DO;  [facility];  Service: Endoscopy;;   BRAVO PH STUDY  12/04/2012   Procedure: BRAVO PH STUDY;  Surgeon: West Bali, MD;  [facility]: [facility];  Service: Endoscopy;  Laterality: N/A;   CHOLECYSTECTOMY N/A 01/03/2014   Procedure: LAPAROSCOPIC CHOLECYSTECTOMY WITH INTRAOPERATIVE CHOLANGIOGRAM;  Surgeon: Clovis Pu. Cornett, MD;  Location: [facility];  Service: General;  Laterality: N/A;   COLONOSCOPY  01/2004   [facility], few small tics   COLONOSCOPY WITH PROPOFOL N/A 05/10/2023   Procedure: COLONOSCOPY WITH PROPOFOL;  Surgeon: Lanelle Bal, DO;  [facility];  Service: Endoscopy;  Laterality: N/A;  1245pm, asa 3   ESOPHAGOGASTRODUODENOSCOPY  02/2010   gastric ulcers   ESOPHAGOGASTRODUODENOSCOPY  12/04/2012   [id number] gastritis (inflammation) was found in the gastric antrum; multiple biopsies The duodenal mucosa showed no abnormalities in the bulb and second portion of the duodenum   ESOPHAGOGASTRODUODENOSCOPY (EGD) WITH PROPOFOL N/A 03/20/2022   Procedure: ESOPHAGOGASTRODUODENOSCOPY (EGD) WITH PROPOFOL;  Surgeon: Lanelle Bal, DO;  [facility];  Service: Endoscopy;  Laterality: N/A;   ESOPHAGOGASTRODUODENOSCOPY  (EGD) WITH PROPOFOL N/A 06/28/2022   Procedure: ESOPHAGOGASTRODUODENOSCOPY (EGD) WITH PROPOFOL;  Surgeon: Lanelle Bal, DO;  [facility];  Service: Endoscopy;  Laterality: N/A;  800   ESOPHAGOGASTRODUODENOSCOPY (EGD) WITH PROPOFOL N/A 05/10/2023   Procedure: ESOPHAGOGASTRODUODENOSCOPY (EGD) WITH PROPOFOL;  Surgeon: Lanelle Bal, DO;  [facility];  Service: Endoscopy;  Laterality: N/A;   JOINT REPLACEMENT Right 10/22/2019   KYPHOPLASTY N/A 02/14/2019   Procedure: KYPHOPLASTY T12 and L1;  Surgeon: [name], MD;  Location: [facility];  Service: Orthopedics;  Laterality: N/A;  120  mins   LASIK     LIPOMA EXCISION  08/02/2011   left shoulder   NOSE SURGERY     PARTIAL HYSTERECTOMY     vaginal at age 48 years of age   POLYPECTOMY  05/10/2023   Procedure: POLYPECTOMY;  Surgeon: Lanelle Bal, DO;  [facility]: [facility];  Service: Endoscopy;;   TOE DEBRIDEMENT Left 1962   lt great toe   TOTAL HIP ARTHROPLASTY Right 11/18/2021   Procedure: TOTAL HIP ARTHROPLASTY ANTERIOR APPROACH;  Surgeon: Ollen Gross, MD;  Location: [facility];  Service: Orthopedics;  Laterality: Right;   TOTAL HIP ARTHROPLASTY Left 06/29/2023   Procedure: LEFT TOTAL HIP ARTHROPLASTY ANTERIOR APPROACH;  Surgeon: Ollen Gross, MD;  Location: [facility];  Service: Orthopedics;  Laterality: Left;   TOTAL KNEE ARTHROPLASTY Right 10/22/2019   Procedure: TOTAL KNEE ARTHROPLASTY;  Surgeon: Ollen Gross, MD;  Location: [facility];  Service: Orthopedics;  Laterality: Right;    TOTAL KNEE ARTHROPLASTY Left 03/03/2020   Procedure: TOTAL KNEE ARTHROPLASTY;  Surgeon: Ollen Gross, MD;  Location: [facility];  Service: Orthopedics;  Laterality: Left;    TRANSFORAMINAL LUMBAR INTERBODY FUSION (TLIF) WITH PEDICLE SCREW FIXATION 1 LEVEL N/A 01/28/2021   Procedure: TRANSFORAMINAL LUMBAR INTERBODY FUSION (TLIF) LUMBAR FIVE-SACRAL ONE;  Surgeon: [name], MD;  Location: [facility];  Service: Orthopedics;   Laterality: N/A;  4 hrs   VIDEO ASSISTED THORACOSCOPY (VATS)/EMPYEMA Left 10/25/2016   Procedure: VIDEO ASSISTED THORACOSCOPY (VATS), BRONCH,DRAINAGE OF PLEURAL EFFUSION,PERICARDIAL WINDOW WITH DRAINAGE OF PERICARDIAL FLUID, TEE;  Surgeon: Loreli Slot, MD;  Location: [facility];  Service: Thoracic;  Laterality: Left;   VIDEO BRONCHOSCOPY N/A 10/25/2016   Procedure: VIDEO BRONCHOSCOPY;  Surgeon: Loreli Slot, MD;  Location: [facility];  Service: Thoracic;  Laterality: N/A;    Social History   Socioeconomic History   Marital status: Married    Spouse name: Greggory Stallion   Number of children: 2   Years of education: 12th grade   Highest education level: Not on file  Occupational History   Occupation: retired    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   2 granddaughters.   Retired HR.    Social Drivers of Corporate investment banker Strain: Low Risk  (05/23/2023)   Overall Financial Resource Strain (CARDIA)    Difficulty of Paying Living Expenses: Not hard at all  Food Insecurity: No Food Insecurity (06/29/2023)   Hunger Vital Sign    Worried About Running Out of Food in the Last Year: Never true    Ran Out of Food in the Last Year: Never true  Transportation Needs: No Transportation Needs (06/29/2023)   PRAPARE - Administrator, Civil Service (Medical): No    Lack of Transportation (Non-Medical): No  Physical Activity: Insufficiently Active (05/23/2023)   Exercise Vital Sign    Days of Exercise per Week: 3 days    Minutes of Exercise per Session: 30 min  Stress:  No Stress Concern Present (  05/23/2023)   [place] [employer] - Occupational Stress Questionnaire    Feeling of Stress : Not at all  Social Connections: Moderately Integrated (05/23/2023)   Social Connection and Isolation Panel [NHANES]    Frequency of Communication with Friends and Family: More than three times a week    Frequency of Social Gatherings with Friends and Family: More than three times a week    Attends Religious Services: More than 4 times per year    Active Member of [employer] or Organizations: No    Attends Banker Meetings: Never    Marital Status: Married  Catering manager Violence: Not At Risk (06/29/2023)   Humiliation, Afraid, Rape, and Kick questionnaire    Fear of Current or Ex-Partner: No    Emotionally Abused: No    Physically Abused: No    Sexually Abused: No    Outpatient Encounter Medications as of 01/11/2024  Medication Sig   cetirizine (ZYRTEC) 10 MG tablet Take 10 mg by mouth daily.   fluticasone (FLONASE) 50 MCG/ACT nasal spray Place 2 sprays into both nostrils daily.   gabapentin (NEURONTIN) 100 MG capsule Take 1 capsule (100 mg total) by mouth 3 (three) times daily.   lactose free nutrition (BOOST) LIQD Take 237 mLs by mouth daily.   levothyroxine (SYNTHROID) 88 MCG tablet TAKE 1 TABLET BY MOUTH EVERY DAY BEFORE BREAKFAST   nilotinib (TASIGNA) 150 MG capsule Take 2 capsules (300 mg total) by mouth every 12 (twelve) hours.   Probiotic Product (DIGESTIVE ADV PREBIOT+PROBIOT) CHEW Chew 2 each by mouth daily.   rosuvastatin (CRESTOR) 5 MG tablet Take 1 tablet (5 mg total) by mouth at bedtime.   citalopram (CELEXA) 20 MG tablet Take 1 tablet (20 mg total) by mouth daily.   penicillin v potassium (VEETID) 500 MG tablet Take 500 mg by mouth 3 (three) times daily. (Patient not taking: Reported on 01/11/2024)   [DISCONTINUED] citalopram (CELEXA) 20 MG tablet TAKE 1 TABLET BY MOUTH EVERY DAY   No facility-administered encounter  medications on file as of 01/11/2024.    Allergies  Allergen Reactions   Nucynta [Tapentadol Hcl] Swelling   Codeine Nausea Only and Other (See Comments)    Headache   Sulfonamide Derivatives Nausea Only and Other (See Comments)    Headache   Nexium [Esomeprazole Magnesium] Nausea Only   Sulfa Antibiotics Nausea Only    Headache    Fluconazole Rash    Redness and blistering on left thigh    Pertinent ROS per HPI, otherwise unremarkable      Objective:  BP 132/74 (BP Location: Right Arm)   Pulse 90   Temp (!) 97 F (36.1 C)   Ht 5\' 5"  (1.651 m)   Wt 210 lb (95.3 kg)   SpO2 98%   BMI 34.95 kg/m    Wt Readings from Last 3 Encounters:  01/11/24 210 lb (95.3 kg)  12/27/23 207 lb (93.9 kg)  12/01/23 214 lb 3.2 oz (97.2 kg)    Physical Exam Vitals and nursing note reviewed.  Constitutional:      General: She is not in acute distress.    Appearance: She is obese. She is ill-appearing (chronically ill) and diaphoretic (chronic). She is not toxic-appearing.  HENT:     Head: Normocephalic and atraumatic.     Mouth/Throat:     Mouth: Mucous membranes are moist.     Pharynx: Oropharynx is clear.  Eyes:     Conjunctiva/sclera: Conjunctivae normal.     Pupils:  Pupils are equal, round, and reactive to light.  Cardiovascular:     Rate and Rhythm: Normal rate and regular rhythm.     Heart sounds: Normal heart sounds.  Pulmonary:     Effort: Pulmonary effort is normal.     Breath sounds: Normal breath sounds.  Skin:    General: Skin is warm.     Capillary Refill: Capillary refill takes less than 2 seconds.     Findings: Rash (dried up, erythematous rash to left upper extremity) present.  Neurological:     General: No focal deficit present.     Mental Status: She is alert and oriented to person, place, and time.  Psychiatric:        Attention and Perception: Attention normal.        Mood and Affect: Mood is anxious. Affect is tearful.        Behavior: Behavior normal.         Thought Content: Thought content normal.        Judgment: Judgment normal.    Physical Exam   NEUROLOGICAL: Numbness from thumb down due to shingles affecting the nerve path. SKIN: Lesions on left arm dried up.        Results for orders placed or performed in visit on 11/21/23  BCR-ABL1, CML/ALL, PCR, QUANT   Collection Time: 11/21/23  1:49 PM  Result Value Ref Range   b2a2 transcript <0.0032 % %   b3a2 transcript <0.0032 % %   E1A2 Transcript <0.0032 % %   Interpretation (BCRAL): Negative    Director Review [facility]): Comment    Background: Comment    Methodology Comment   Lactate dehydrogenase   Collection Time: 11/21/23  1:49 PM  Result Value Ref Range   LDH 147 98 - 192 U/L  Comprehensive metabolic panel   Collection Time: 11/21/23  1:49 PM  Result Value Ref Range   Sodium 141 135 - 145 mmol/L   Potassium 5.0 3.5 - 5.1 mmol/L   Chloride 106 98 - 111 mmol/L   CO2 24 22 - 32 mmol/L   Glucose, Bld 99 70 - 99 mg/dL   BUN 29 (H) 8 - 23 mg/dL   Creatinine, Ser 4.09 (H) 0.44 - 1.00 mg/dL   Calcium 9.6 8.9 - 81.1 mg/dL   Total Protein 8.1 6.5 - 8.1 g/dL   Albumin 4.1 3.5 - 5.0 g/dL   AST 34 15 - 41 U/L   ALT 22 0 - 44 U/L   Alkaline Phosphatase 151 (H) 38 - 126 U/L   Total Bilirubin 0.6 <1.2 mg/dL   GFR, Estimated 51 (L) >60 mL/min   Anion gap 11 5 - 15  Iron and TIBC   Collection Time: 11/21/23  1:49 PM  Result Value Ref Range   Iron 33 28 - 170 ug/dL   TIBC 914 782 - 956 ug/dL   Saturation Ratios 11 10.4 - 31.8 %   UIBC 278 ug/dL  Ferritin   Collection Time: 11/21/23  1:49 PM  Result Value Ref Range   Ferritin 197 11 - 307 ng/mL  CBC with Differential/Platelet   Collection Time: 11/21/23  1:49 PM  Result Value Ref Range   WBC 12.8 (H) 4.0 - 10.5 K/uL   RBC 4.26 3.87 - 5.11 MIL/uL   Hemoglobin 12.2 12.0 - 15.0 g/dL   HCT 21.3 08.6 - 57.8 %   MCV 90.6 80.0 - 100.0 fL   MCH 28.6 26.0 - 34.0 pg   MCHC 31.6 30.0 -  36.0 g/dL   RDW 62.1 30.8 - 65.7 %    Platelets 311 150 - 400 K/uL   nRBC 0.0 0.0 - 0.2 %   Neutrophils Relative % 74 %   Neutro Abs 9.4 (H) 1.7 - 7.7 K/uL   Lymphocytes Relative 16 %   Lymphs Abs 2.1 0.7 - 4.0 K/uL   Monocytes Relative 7 %   Monocytes Absolute 0.9 0.1 - 1.0 K/uL   Eosinophils Relative 3 %   Eosinophils Absolute 0.4 0.0 - 0.5 K/uL   Basophils Relative 0 %   Basophils Absolute 0.1 0.0 - 0.1 K/uL   Immature Granulocytes 0 %   Abs Immature Granulocytes 0.05 0.00 - 0.07 K/uL       Pertinent labs & imaging results that were available during my care of the patient were reviewed by me and considered in my medical decision making.  Assessment & Plan:  Konica "Jacquese Cassarino" was seen today for shoulder pain.  Diagnoses and all orders for this visit:  Herpes zoster without complication -     gabapentin (NEURONTIN) 100 MG capsule; Take 1 capsule (100 mg total) by mouth 3 (three) times daily.  Major depressive disorder, single episode, mild degree (HCC) -     citalopram (CELEXA) 20 MG tablet; Take 1 tablet (20 mg total) by mouth daily.  Anxiety -     citalopram (CELEXA) 20 MG tablet; Take 1 tablet (20 mg total) by mouth daily.     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 Completed a 10-day antiviral course. Lesions have dried, indicating non-contagious status. Persistent pain and numbness in the thumb, typical for postherpetic neuralgia. Discussed gradual return of sensation and potential side effects of gabapentin, including drowsiness and sedation. - Continue moist, cool compresses - Use acetaminophen and ibuprofen for pain management - Prescribe gabapentin for neuropathic pain, to be taken three times a day as needed - Advise to wait six months before receiving the shingles vaccine  Nausea Nausea likely secondary to antiviral medication for shingles. Prefers to avoid additional medications. - Monitor symptoms and avoid additional  medications if possible  Dental Infection Dental infection due to broken and deteriorating teeth. Has not started the prescribed antibiotic. Emphasized the importance of starting the antibiotic to prevent further infection. - Start prescribed antibiotic for dental infection  Anxiety and Depression Currently on Celexa for anxiety and depression but reports it is not effective. Prefers to minimize medication use. - Refill Celexa - Discuss medication efficacy and potential adjustments during the next visit  General Health Maintenance Advised to maintain general health practices and follow up on routine care. - Cancel February 4th appointment - Schedule follow-up visit on March 12th to review medications and overall health.          Continue all other maintenance medications.  Follow up plan: Return if symptoms worsen or fail to improve.   Continue healthy lifestyle choices, including diet (rich in fruits, vegetables, and lean proteins, and low in salt and simple carbohydrates) and exercise (at least 30 minutes of moderate physical activity daily).  Educational handout given for shingles  The above assessment and management plan was discussed with the patient. The patient verbalized understanding of and has agreed to the management plan. Patient is aware to call the clinic if they develop any new symptoms or if symptoms persist or worsen. Patient is aware when to return to the clinic for a follow-up visit. Patient educated  on when it is appropriate to go to the emergency department.   Kari Baars, FNP-C [facility] [phone]

## 2024-01-17 ENCOUNTER — Ambulatory Visit: Payer: PPO | Admitting: Family Medicine

## 2024-02-02 ENCOUNTER — Encounter: Payer: Self-pay | Admitting: Family

## 2024-02-02 ENCOUNTER — Ambulatory Visit: Payer: PPO | Admitting: Family

## 2024-02-02 VITALS — BP 126/60 | HR 90 | Temp 100.3°F | Ht 65.0 in

## 2024-02-02 DIAGNOSIS — J101 Influenza due to other identified influenza virus with other respiratory manifestations: Secondary | ICD-10-CM | POA: Diagnosis not present

## 2024-02-02 DIAGNOSIS — R110 Nausea: Secondary | ICD-10-CM | POA: Diagnosis not present

## 2024-02-02 DIAGNOSIS — R6889 Other general symptoms and signs: Secondary | ICD-10-CM

## 2024-02-02 LAB — VERITOR FLU A/B WAIVED
Influenza A: POSITIVE — AB
Influenza B: NEGATIVE

## 2024-02-02 LAB — RSV AG, IMMUNOCHR, WAIVED: RSV Ag, Immunochr, Waived: NEGATIVE

## 2024-02-02 MED ORDER — ONDANSETRON 4 MG PO TBDP
4.0000 mg | ORAL_TABLET | Freq: Once | ORAL | Status: AC
  Administered 2024-02-02: 4 mg via ORAL

## 2024-02-02 MED ORDER — OSELTAMIVIR PHOSPHATE 75 MG PO CAPS: 75.0000 mg | ORAL_CAPSULE | Freq: Two times a day (BID) | ORAL | 0 refills | Status: DC

## 2024-02-02 MED ORDER — ONDANSETRON HCL 4 MG PO TABS: 4.0000 mg | ORAL_TABLET | Freq: Three times a day (TID) | ORAL | 0 refills | Status: DC | PRN

## 2024-02-02 MED ORDER — PROMETHAZINE-DM 6.25-15 MG/5ML PO SYRP: 5.0000 mL | ORAL_SOLUTION | Freq: Three times a day (TID) | ORAL | 0 refills | Status: DC | PRN

## 2024-02-02 MED ORDER — BENZONATATE 200 MG PO CAPS: 200.0000 mg | ORAL_CAPSULE | Freq: Three times a day (TID) | ORAL | 1 refills | Status: DC | PRN

## 2024-02-02 NOTE — Patient Instructions (Signed)

## 2024-02-02 NOTE — Progress Notes (Signed)
d  Subjective:    Patient ID: Andrea Martinez, female    DOB: 11-Jan-1946, 78 y.o.   MRN: [id number]  Chief Complaint  Patient presents with   Nausea   Emesis    2 days   Fever   Pt presents to the office today with nausea, headache, body aches that started two days ago.  Emesis  This is a new problem. The current episode started in the past 7 days. The problem occurs less than 2 times per day. Associated symptoms include coughing, a fever, headaches and URI. Pertinent negatives include no diarrhea.  Fever  Associated symptoms include congestion, coughing, headaches, nausea and vomiting. Pertinent negatives include no diarrhea, ear pain, sore throat or urinary pain.  URI  This is a new problem. The current episode started in the past 7 days. The problem has been gradually worsening. The maximum temperature recorded prior to her arrival was 100.4 - 100.9 F. Associated symptoms include congestion, coughing, headaches, joint pain, nausea, rhinorrhea, sneezing and vomiting. Pertinent negatives include no diarrhea, dysuria, ear pain, sinus pain or sore throat. She has tried increased fluids and acetaminophen (cough syrup) for the symptoms. The treatment provided mild relief.      Review of Systems  Constitutional:  Positive for fever.  HENT:  Positive for congestion, rhinorrhea and sneezing. Negative for ear pain, sinus pain and sore throat.   Respiratory:  Positive for cough.   Gastrointestinal:  Positive for nausea and vomiting. Negative for diarrhea.  Genitourinary:  Negative for dysuria.  Musculoskeletal:  Positive for joint pain.  Neurological:  Positive for headaches.  All other systems reviewed and are negative.   Social History   Socioeconomic History   Marital status: Married    Spouse name: Andrea Martinez   Number of children: 2   Years of education: 12th grade   Highest education level: Not on file  Occupational History   Occupation: retired    Associate Professor: UENPLOYED    Employer:  RETIRED  Tobacco Use   Smoking status: Never    Passive exposure: Never   Smokeless tobacco: Never  Vaping Use   Vaping status: Never Used  Substance and Sexual Activity   Alcohol use: No    Alcohol/week: 0.0 standard drinks of alcohol   Drug use: No   Sexual activity: Yes    Partners: Male    Birth control/protection: None  Other Topics Concern   Not on file  Social History Narrative   1 son, 1 daughter.   2 granddaughters.   Retired HR.    Social Drivers of Corporate investment banker Strain: Low Risk  (05/23/2023)   Overall Financial Resource Strain (CARDIA)    Difficulty of Paying Living Expenses: Not hard at all  Food Insecurity: No Food Insecurity (06/29/2023)   Hunger Vital Sign    Worried About Running Out of Food in the Last Year: Never true    Ran Out of Food in the Last Year: Never true  Transportation Needs: No Transportation Needs (06/29/2023)   PRAPARE - Administrator, Civil Service (Medical): No    Lack of Transportation (Non-Medical): No  Physical Activity: Insufficiently Active (05/23/2023)   Exercise Vital Sign    Days of Exercise per Week: 3 days    Minutes of Exercise per Session: 30 min  Stress: No Stress Concern Present (05/23/2023)   [employer] of Occupational Health - Occupational Stress Questionnaire    Feeling of Stress : Not at all  Social  Connections: Moderately Integrated (05/23/2023)   Social Connection and Isolation Panel [NHANES]    Frequency of Communication with Friends and Family: More than three times a week    Frequency of Social Gatherings with Friends and Family: More than three times a week    Attends Religious Services: More than 4 times per year    Active Member of [employer] or Organizations: No    Attends Engineer, structural: Never    Marital Status: Married   Family History  Problem Relation Age of Onset   Lung cancer Father    COPD Mother    Stroke Mother    Thyroid disease Mother    Colon cancer  Neg Hx    Anesthesia problems Neg Hx    Hypotension Neg Hx    Malignant hyperthermia Neg Hx    Pseudochol deficiency Neg Hx         Objective:   Physical Exam Vitals reviewed.  Constitutional:      General: She is not in acute distress.    Appearance: She is well-developed. She is ill-appearing.  HENT:     Head: Normocephalic and atraumatic.     Right Ear: Tympanic membrane normal.     Left Ear: Tympanic membrane normal.  Eyes:     Pupils: Pupils are equal, round, and reactive to light.  Neck:     Thyroid: No thyromegaly.  Cardiovascular:     Rate and Rhythm: Normal rate and regular rhythm.     Heart sounds: Normal heart sounds. No murmur heard. Pulmonary:     Effort: Pulmonary effort is normal. No respiratory distress.     Breath sounds: Normal breath sounds. No wheezing.  Abdominal:     General: Bowel sounds are normal. There is no distension.     Palpations: Abdomen is soft.     Tenderness: There is no abdominal tenderness.  Musculoskeletal:        General: No tenderness. Normal range of motion.     Cervical back: Normal range of motion and neck supple.  Skin:    General: Skin is warm and dry.  Neurological:     Mental Status: She is alert and oriented to person, place, and time.     Cranial Nerves: No cranial nerve deficit.     Deep Tendon Reflexes: Reflexes are normal and symmetric.  Psychiatric:        Behavior: Behavior normal.        Thought Content: Thought content normal.        Judgment: Judgment normal.     Comments: Crying        BP 126/60   Pulse 90   Temp 100.3 F (37.9 C)   Ht 5\' 5"  (1.651 m)   SpO2 93%   BMI 34.95 kg/m      Assessment & Plan:  [name] comes in today with chief complaint of Nausea, Emesis (2 days), and Fever   Diagnosis and orders addressed:  1. Flu-like symptoms (Primary) - Veritor Flu A/B Waived - RSV Ag, Immunochr, Waived - ondansetron (ZOFRAN-ODT) disintegrating tablet 4 mg  2. Nausea - ondansetron  (ZOFRAN-ODT) disintegrating tablet 4 mg  3. Influenza A - oseltamivir (TAMIFLU) 75 MG capsule; Take 1 capsule (75 mg total) by mouth 2 (two) times daily.  Dispense: 10 capsule; Refill: 0 - benzonatate (TESSALON) 200 MG capsule; Take 1 capsule (200 mg total) by mouth 3 (three) times daily as needed.  Dispense: 30 capsule; Refill: 1 - ondansetron (ZOFRAN) 4  MG tablet; Take 1 tablet (4 mg total) by mouth every 8 (eight) hours as needed for nausea or vomiting.  Dispense: 20 tablet; Refill: 0 - promethazine-dextromethorphan (PROMETHAZINE-DM) 6.25-15 MG/5ML syrup; Take 5 mLs by mouth 3 (three) times daily as needed for cough.  Dispense: 118 mL; Refill: 0   Start Tamiflu, tessalon, Zofran, promethazine-dm Force fluids  Droplet precautions  Discussed precautions to go to ED [facility] diet  Follow up if symptoms worsen or do not improve   Jannifer Rodney, FNP

## 2024-02-08 IMAGING — DX DG ABDOMEN ACUTE W/ 1V CHEST
4 series · 4 of 4 positions shown · non-contrast
Comparison: Abdominopelvic CT 12/24/2018

CLINICAL DATA: Abdominal pain and vomiting.

EXAM:
DG ABDOMEN ACUTE WITH 1 VIEW CHEST

[chest pa]
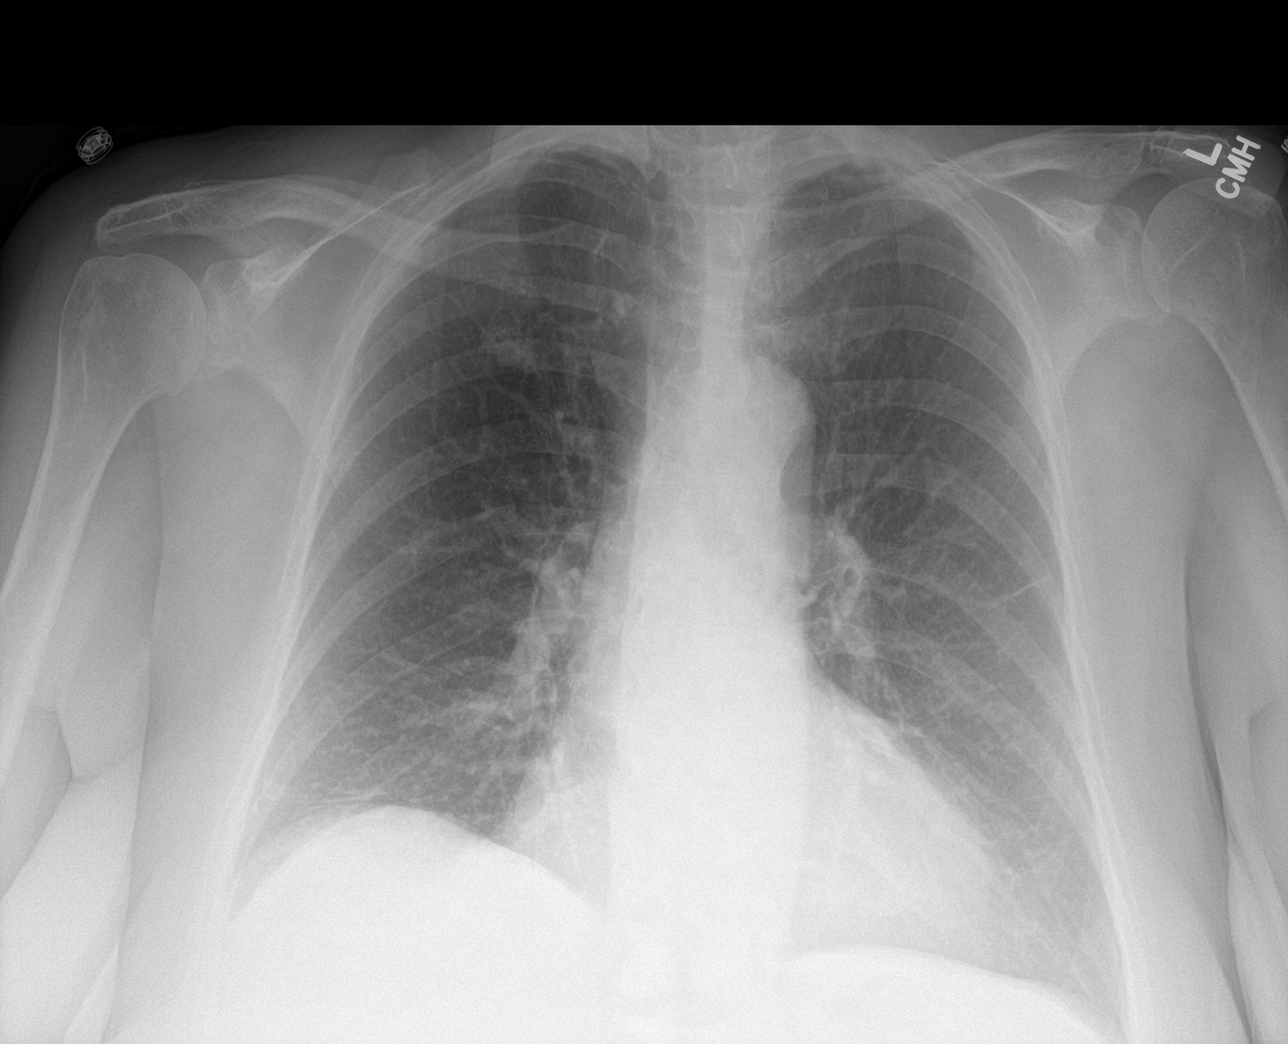

[abdomen erect]
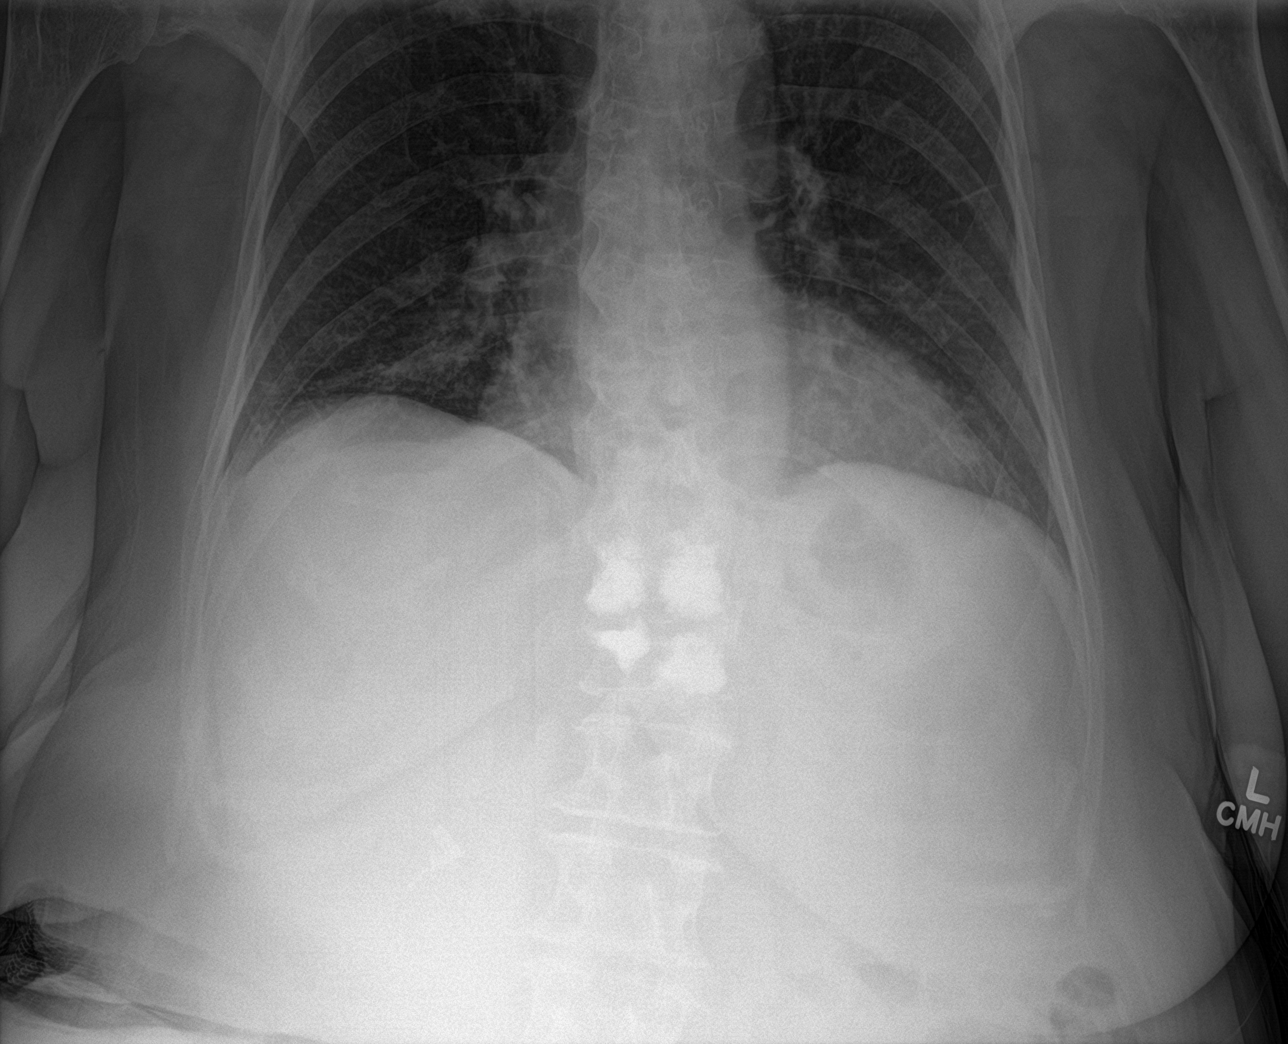

[abdomen supine (1 of 2)]
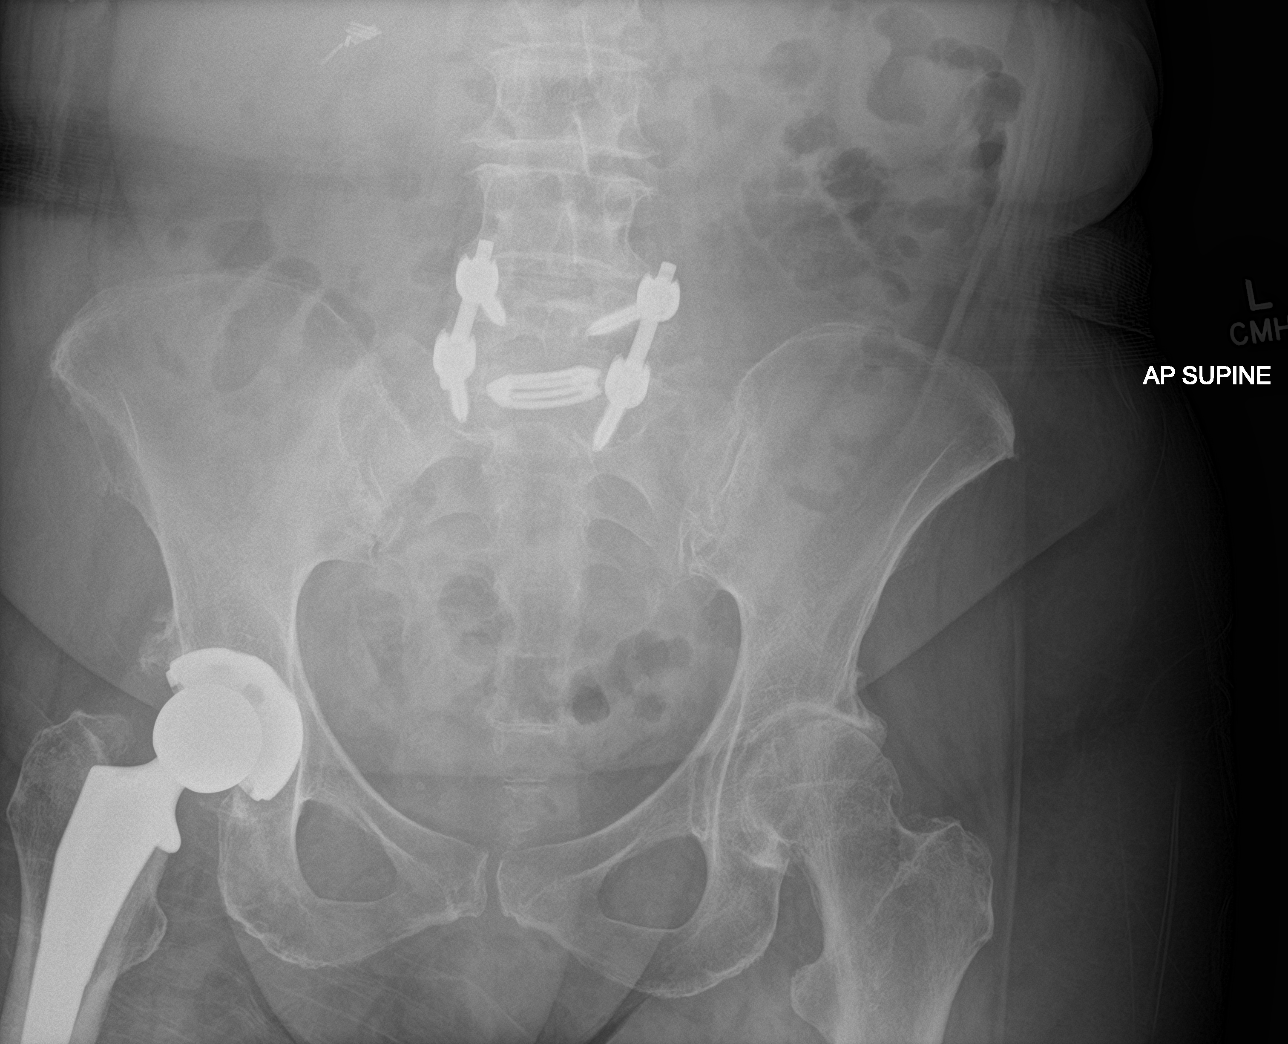

[abdomen supine (2 of 2)]
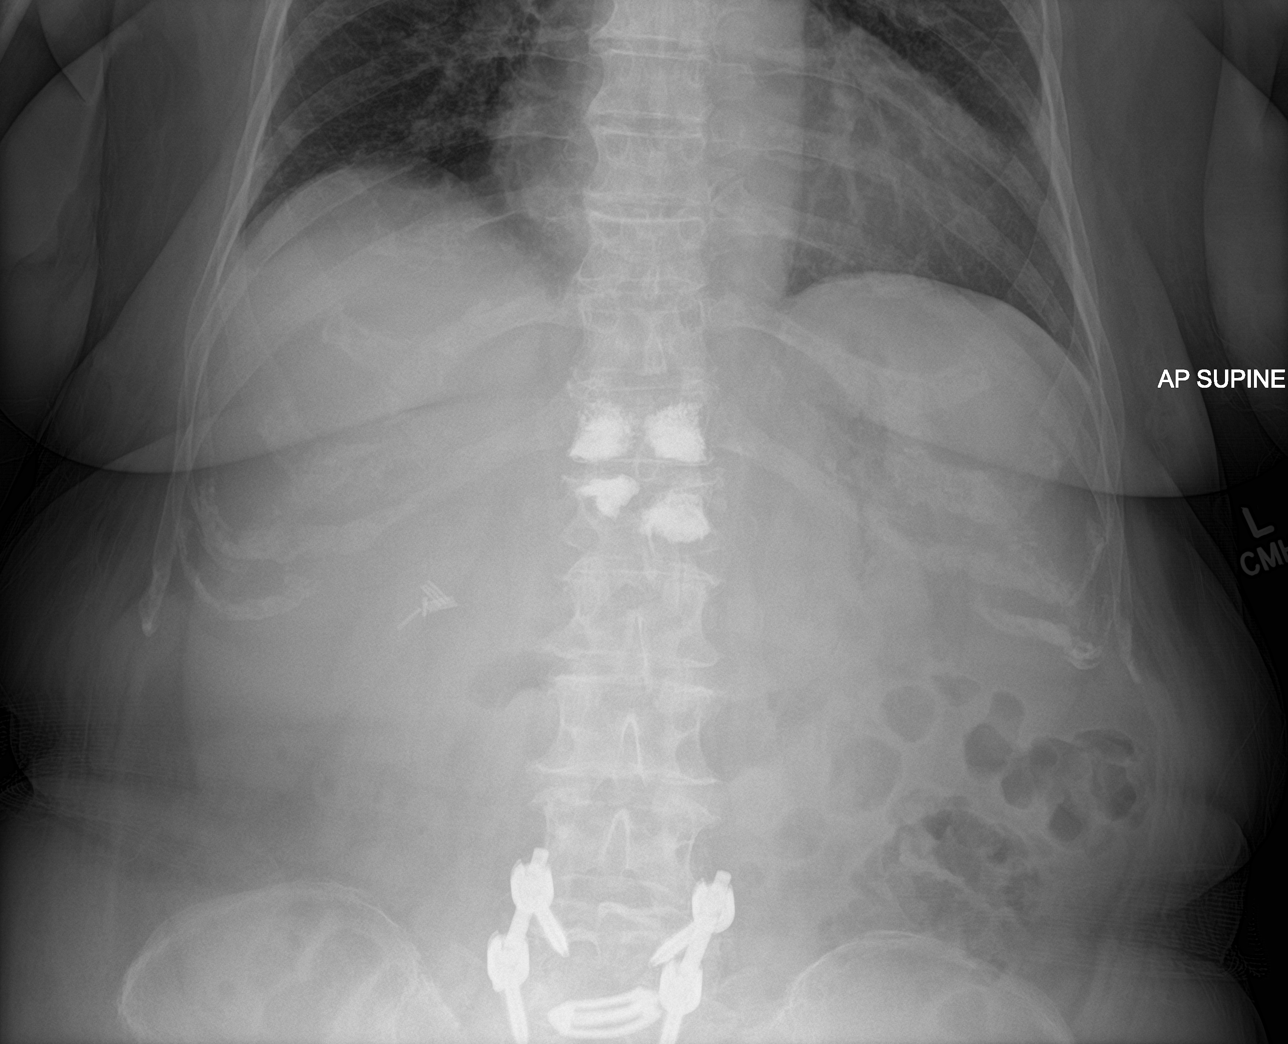

[4 of 4 positions shown; findings below may reference images not displayed]

FINDINGS: Upper normal heart size. Subsegmental atelectasis at the right lung
base and left mid lung. No confluent consolidation. Biapical
pleuroparenchymal scarring. No pleural fluid. No bowel dilatation to
suggest obstruction. No significant formed stool in the colon.
Cholecystectomy clips in the right upper quadrant. Thoraco lumbar
vertebral augmentation. Lower lumbar surgical hardware. Right hip
arthroplasty.
IMPRESSION: 1. Normal bowel gas pattern without evidence of obstruction.
2. Subsegmental atelectasis at the right lung base and left midlung.

## 2024-02-08 IMAGING — CT CT ABD-PELV W/ CM
3 of 5 series · 16 of 46 positions shown, 18 images · IV contrast (Omnipaque or Isovue)
Comparison: December 24, 2018

CLINICAL DATA: Abdominal pain and vomiting.

EXAM:
CT ABDOMEN AND PELVIS WITH CONTRAST
TECHNIQUE: Multidetector CT imaging of the abdomen and pelvis was performed
using the standard protocol following bolus administration of
intravenous contrast.

[Series 2: axial st · axial · 0.73mm/px · z∈[+938,+1298]mm · 11 of 88 slices shown, 13 images]
[im 8/88  soft-tissue]
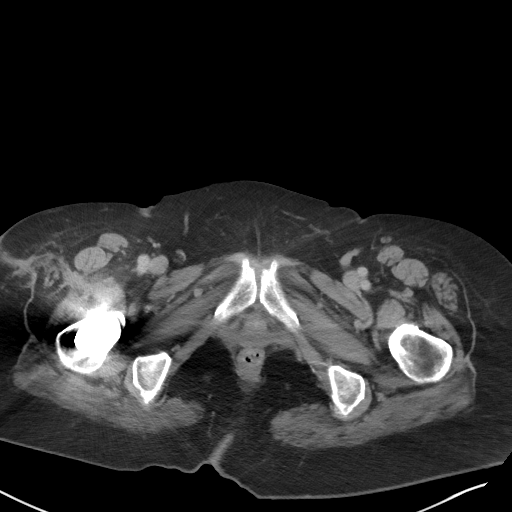
[im 8/88  bone]
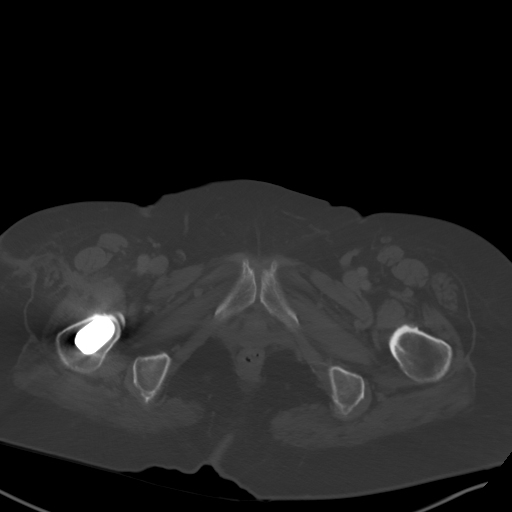
[im 15/88  soft-tissue]
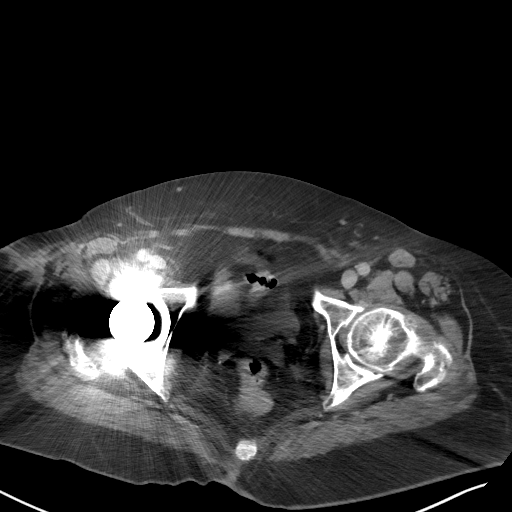
[im 22/88  soft-tissue]
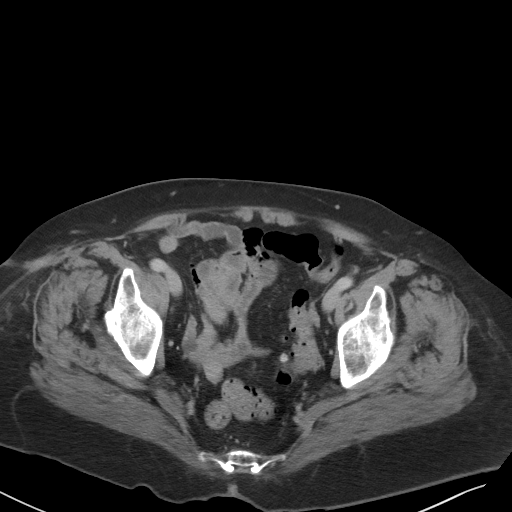
[im 30/88  soft-tissue]
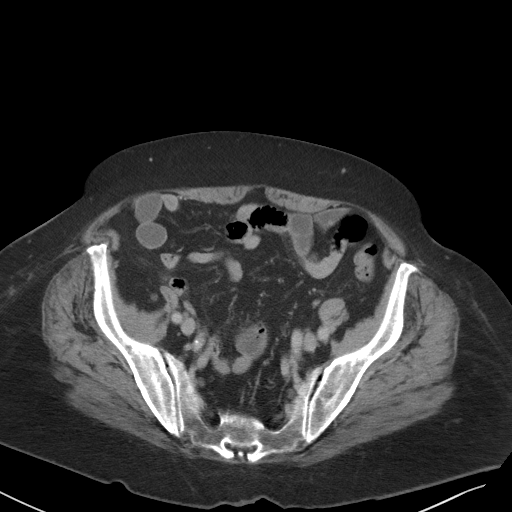
[im 37/88  soft-tissue]
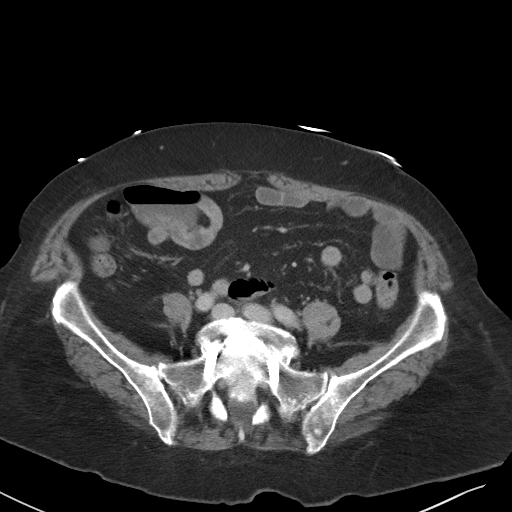
[im 44/88  soft-tissue]
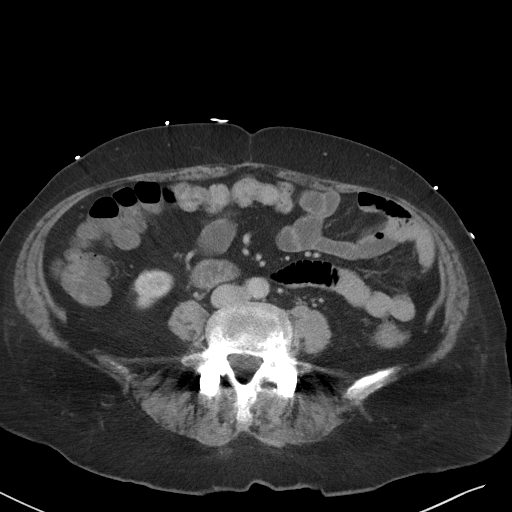
[im 51/88  soft-tissue]
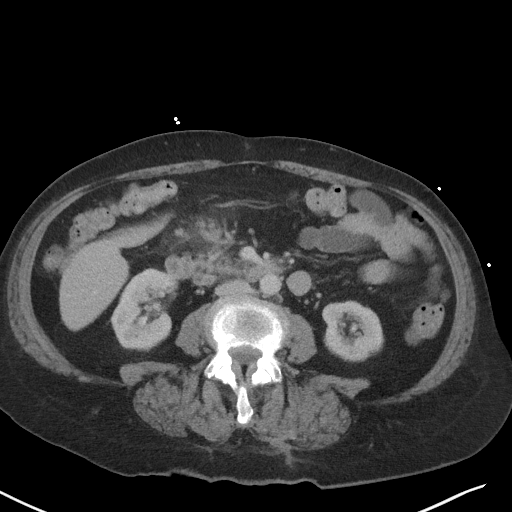
[im 59/88  soft-tissue]
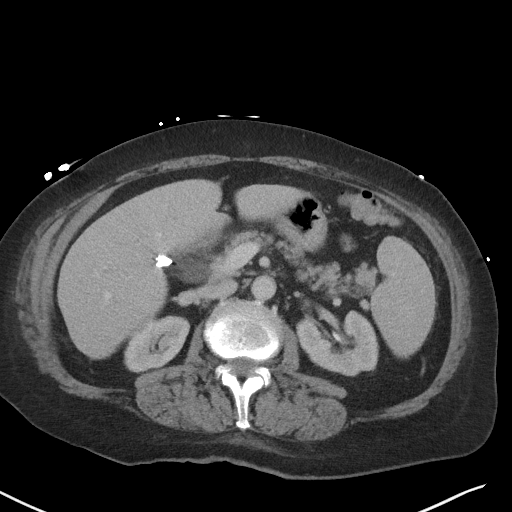
[im 66/88  soft-tissue]
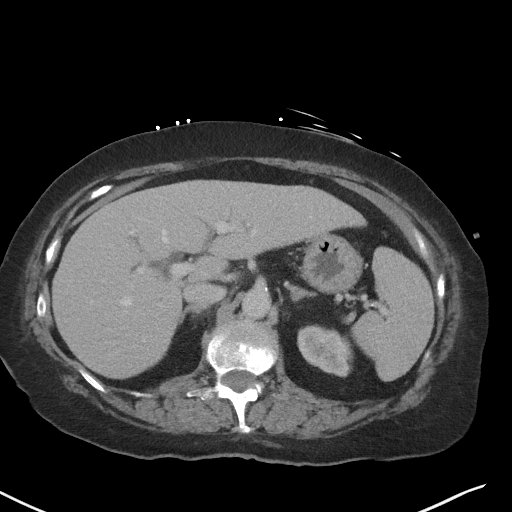
[im 66/88  bone]
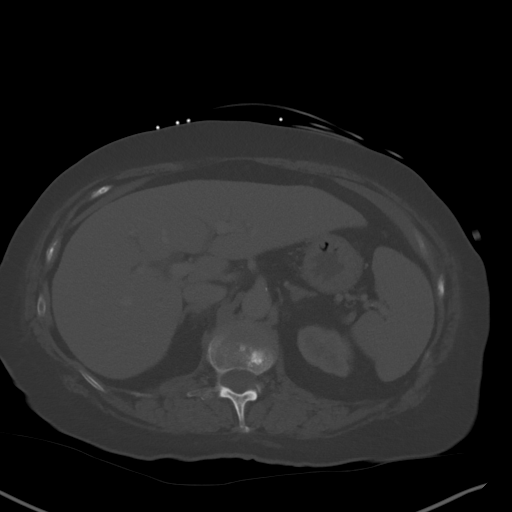
[im 73/88  soft-tissue]
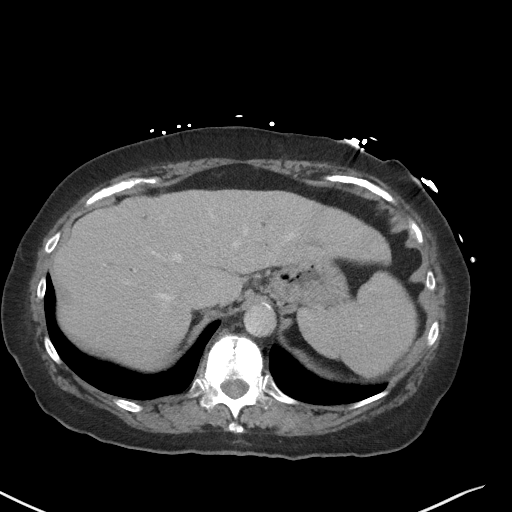
[im 80/88  soft-tissue]
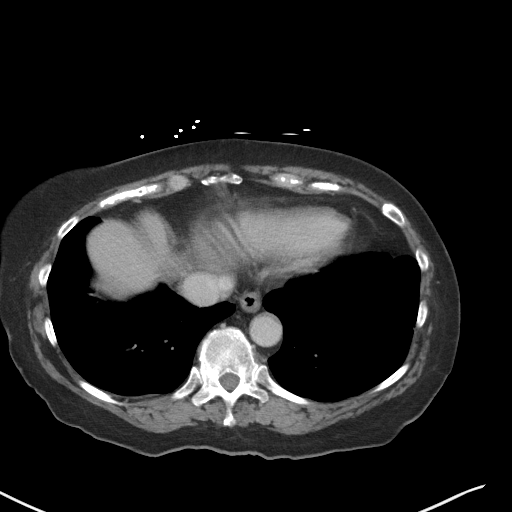

[Series 4: lung bases · axial · 0.73mm/px · z∈[+938,+1008]mm · 2 of 88 slices shown]
[im 8/88  bone]
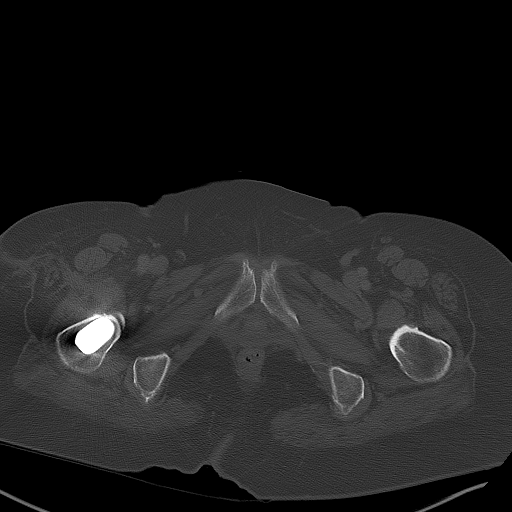
[im 22/88  bone]
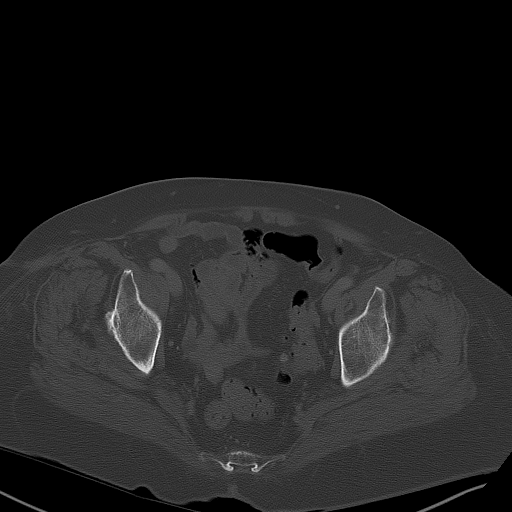

[Series 5: coronal st · coronal · 0.76mm/px · 3 of 81 slices shown]
[im 27/81  soft-tissue]
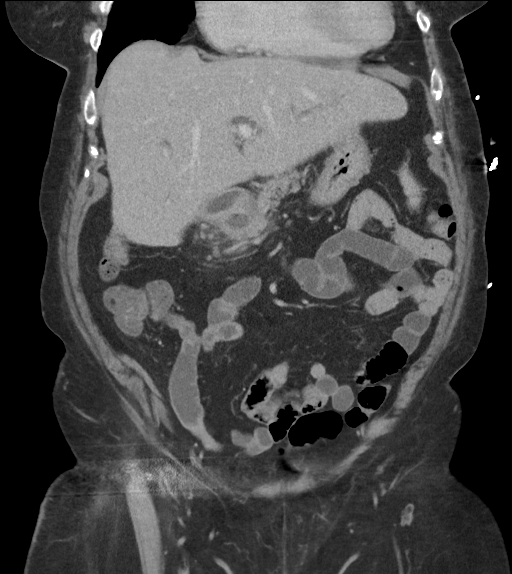
[im 36/81  soft-tissue]
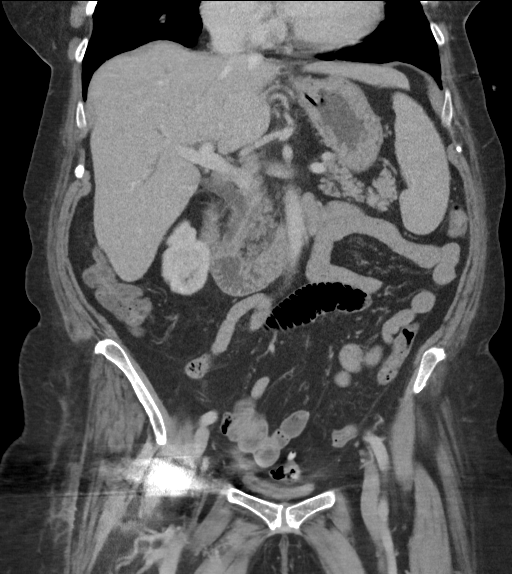
[im 45/81  soft-tissue]
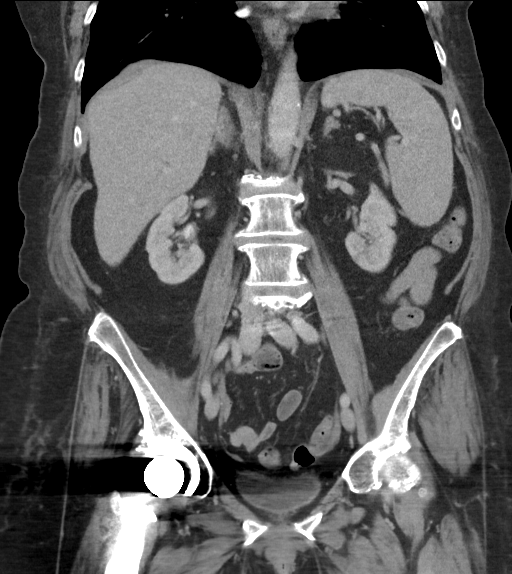

[16 of 46 positions shown; findings below may reference images not displayed]

RADIATION DOSE REDUCTION: This exam was performed according to the
departmental dose-optimization program which includes automated
exposure control, adjustment of the mA and/or kV according to
patient size and/or use of iterative reconstruction technique.

CONTRAST:  100mL OMNIPAQUE IOHEXOL 300 MG/ML  SOLN
FINDINGS: Lower chest: Stable 5 mm solid noncalcified lung nodule is seen
within the lateral aspect of the right lung base.

Hepatobiliary: No focal liver abnormality is seen. Status post
cholecystectomy. A distal common bile duct is dilated and measures
approximately 0.1 cm.

Pancreas: Unremarkable. No pancreatic ductal dilatation or
surrounding inflammatory changes.

Spleen: Normal in size without focal abnormality.

Adrenals/Urinary Tract: No adrenal hemorrhage or renal injury
identified. Urinary bladder is poorly distended and subsequently
limited evaluation. Overlying streak artifact is also seen.

Stomach/Bowel: A 2.2 cm x 1.4 cm x 1.2 cm area of fluid and air,
with a thin, hyperdense wall, is seen adjacent to the posteromedial
aspect of the duodenal bulb which is markedly inflamed. There is no
evidence of bowel dilatation. Noninflamed diverticula are seen
throughout the sigmoid colon.

Vascular/Lymphatic: No significant vascular findings are present. No
enlarged abdominal or pelvic lymph nodes.

Reproductive: Status post hysterectomy. No adnexal masses.

Other: No abdominal wall hernia or abnormality. No abdominopelvic
ascites.

Musculoskeletal: A total right hip replacement is seen with
associated streak artifact and subsequently limited evaluation of
the adjacent osseous and soft tissue structures.

There is evidence of prior vertebroplasty at the levels of T12 and
L1 with postoperative changes seen at the level of L5-S1.
IMPRESSION: 1. Marked severity proximal duodenitis at the level of the duodenal
bulb, with additional findings consistent with a contained
perforation. Acute gastritis involving the adjacent portion of the
gastric antrum cannot be excluded.
2. Sigmoid diverticulosis.
3. Evidence of prior vertebroplasty at the levels of T12 and L1 with
postoperative changes seen at the level of L5-S1.

## 2024-02-10 ENCOUNTER — Ambulatory Visit: Payer: Self-pay | Admitting: Family Medicine

## 2024-02-10 NOTE — Telephone Encounter (Signed)
 Chief Complaint: cough Symptoms: cough, sweating, decreased appetite Frequency: Over 1 week Pertinent Negatives: Patient denies SOB, CP, NVD Disposition: [] ED /[x] Urgent Care (no appt availability in office) / [] Appointment(In office/virtual)/ []  [facility]/ [] Home Care/ [] Refused Recommended Disposition /[] [facility]/ []  Follow-up with PCP Additional Notes: Patient calls reporting worsening cough x 1 week. States she has taken all prescribed medications, but is not improving. Per protocol, patient to be evaluated within 24 hours. First available appointment with PCP or any provider in clinic outside of guideline, patient declined to be seen in another office. Advised patient urgent care would be the next option, patient states if she gets worse she will go to ER because she feels urgent care will not treat her properly. Care advice reviewed. Patient is requesting call back from PCP to see if there are any other treatment options- Alerting PCP for review and follow up.    Copied from CRM [id number]. Topic: Clinical - Red Word Triage >> Feb 10, 2024  9:00 AM Gaetano Hawthorne wrote: [facility] that prompted transfer to Nurse Triage: Patient was seen last week, she has completed all of the medication that she was provided - She still has a bad cough, she isn't eating or drinking much and she's still experiencing a lot of nausea even with the medication she's been taking. Reason for Disposition  [1] Continuous (nonstop) coughing interferes with work or school AND [2] no improvement using cough treatment per Care Advice  Answer Assessment - Initial Assessment Questions 1. ONSET: "When did the cough begin?"      Over a week 2. SEVERITY: "How bad is the cough today?"      Rattles, constant 3. SPUTUM: "Describe the color of your sputum" (none, dry cough; clear, white, yellow, green)     Dry cough 4. HEMOPTYSIS: "Are you coughing up any blood?" If so ask: "How much?" (flecks, streaks,  tablespoons, etc.)     Denies 5. DIFFICULTY BREATHING: "Are you having difficulty breathing?" If Yes, ask: "How bad is it?" (e.g., mild, moderate, severe)    - MILD: No SOB at rest, mild SOB with walking, speaks normally in sentences, can lie down, no retractions, pulse < 100.    - MODERATE: SOB at rest, SOB with minimal exertion and prefers to sit, cannot lie down flat, speaks in phrases, mild retractions, audible wheezing, pulse 100-120.    - SEVERE: Very SOB at rest, speaks in single words, struggling to breathe, sitting hunched forward, retractions, pulse > 120      Denies 6. FEVER: "Do you have a fever?" If Yes, ask: "What is your temperature, how was it measured, and when did it start?"     Slight, sweating a lot 7. CARDIAC HISTORY: "Do you have any history of heart disease?" (e.g., heart attack, congestive heart failure)      Denies 8. LUNG HISTORY: "Do you have any history of lung disease?"  (e.g., pulmonary embolus, asthma, emphysema)     Denies 9. PE RISK FACTORS: "Do you have a history of blood clots?" (or: recent major surgery, recent prolonged travel, bedridden)     Denies 10. OTHER SYMPTOMS: "Do you have any other symptoms?" (e.g., runny nose, wheezing, chest pain)       Decreased appetite 11. PREGNANCY: "Is there any chance you are pregnant?" "When was your last menstrual period?"       NA 12. TRAVEL: "Have you traveled out of the country in the last month?" (e.g., travel history, exposures)  Denies  Protocols used: Cough - Acute Non-Productive-A-AH

## 2024-02-13 ENCOUNTER — Ambulatory Visit: Payer: PPO | Admitting: "Endocrinology

## 2024-02-21 ENCOUNTER — Telehealth: Payer: Self-pay | Admitting: "Endocrinology

## 2024-02-21 DIAGNOSIS — E782 Mixed hyperlipidemia: Secondary | ICD-10-CM

## 2024-02-21 DIAGNOSIS — E039 Hypothyroidism, unspecified: Secondary | ICD-10-CM

## 2024-02-21 NOTE — Telephone Encounter (Signed)
 Labs updated and sent to [facility].

## 2024-02-21 NOTE — Telephone Encounter (Signed)
 Pt needs labs updated, going Thursday morning

## 2024-02-22 ENCOUNTER — Ambulatory Visit: Payer: PPO | Admitting: Family Medicine

## 2024-02-22 ENCOUNTER — Encounter: Payer: Self-pay | Admitting: Family Medicine

## 2024-02-22 VITALS — BP 131/62 | HR 86 | Temp 94.2°F | Ht 65.0 in | Wt 211.0 lb

## 2024-02-22 DIAGNOSIS — E559 Vitamin D deficiency, unspecified: Secondary | ICD-10-CM | POA: Diagnosis not present

## 2024-02-22 DIAGNOSIS — D500 Iron deficiency anemia secondary to blood loss (chronic): Secondary | ICD-10-CM | POA: Diagnosis not present

## 2024-02-22 DIAGNOSIS — E039 Hypothyroidism, unspecified: Secondary | ICD-10-CM | POA: Diagnosis not present

## 2024-02-22 DIAGNOSIS — F411 Generalized anxiety disorder: Secondary | ICD-10-CM

## 2024-02-22 DIAGNOSIS — I700 Atherosclerosis of aorta: Secondary | ICD-10-CM | POA: Diagnosis not present

## 2024-02-22 DIAGNOSIS — C9220 Atypical chronic myeloid leukemia, BCR/ABL-negative, not having achieved remission: Secondary | ICD-10-CM | POA: Diagnosis not present

## 2024-02-22 DIAGNOSIS — N1831 Chronic kidney disease, stage 3a: Secondary | ICD-10-CM

## 2024-02-22 DIAGNOSIS — I5032 Chronic diastolic (congestive) heart failure: Secondary | ICD-10-CM

## 2024-02-22 DIAGNOSIS — F320 Major depressive disorder, single episode, mild: Secondary | ICD-10-CM | POA: Diagnosis not present

## 2024-02-22 DIAGNOSIS — I5042 Chronic combined systolic (congestive) and diastolic (congestive) heart failure: Secondary | ICD-10-CM | POA: Insufficient documentation

## 2024-02-22 MED ORDER — DESVENLAFAXINE SUCCINATE ER 25 MG PO TB24: 25.0000 mg | ORAL_TABLET | Freq: Every day | ORAL | 3 refills | Status: DC

## 2024-02-22 NOTE — Progress Notes (Signed)
 Subjective:  Patient ID: Andrea Martinez, female    DOB: 1946-12-11, 78 y.o.   MRN: [id number]  Patient Care Team: [name], FNP as PCP - General (Family Medicine) [name], MD as Medical Oncologist (Medical Oncology) [name], MD as Consulting Physician (Endocrinology) [name], MD as Consulting Physician (Orthopedic Surgery)   Chief Complaint:  Medical Management of Chronic Issues (3 month follow up ) and Anxiety (Patient states that she still has a lot of anxiety )   HPI: Andrea Martinez is a 78 y.o. female presenting on 02/22/2024 for Medical Management of Chronic Issues (3 month follow up ) and Anxiety (Patient states that she still has a lot of anxiety )   History of Present Illness   The patient is an 78 year old who presents for a routine follow-up.  She experiences significant fatigue, which has been ongoing for several years. She wakes briefly to take her medication and then falls back asleep, only to be awake from 2 AM onwards. She sits awake with the TV on low volume, unsure of the cause of her disrupted sleep pattern. Excessive fatigue and disrupted sleep patterns are noted.  She has been dealing with multiple infections over the past few months, including a sinus infection lasting over three weeks, shingles, and the flu. She has difficulty recovering from illnesses, which she attributes to her chemotherapy suppressing her immune system.  Persistent numbness in her thumb following a shingles outbreak is reported, with residual symptoms that have lasted for an unspecified duration. She describes the numbness as causing her to 'shake.'  She mentions dental issues, including deteriorating gums and a recent tooth extraction that required antibiotics. She has two spaces without teeth and is concerned about the cost and discomfort of further dental work.  She has chronic diastolic heart failure and denies any symptoms.   She is currently taking  Celexa for anxiety and depression, a cholesterol medication prescribed by her endocrinologist, and vitamins along with Boost nutritional drinks. She is mindful of her diet, avoiding fried foods, and notes her weight has remained stable. She feels the Celexa is not working and she would like to try something else for her anxiety and depression.        02/22/2024    9:57 AM 02/02/2024   11:02 AM 08/19/2023   11:28 AM 06/22/2023    9:26 AM 05/23/2023    8:31 AM  Depression screen PHQ 2/9  Decreased Interest 1 0 1 1 0  Down, Depressed, Hopeless 1 0 1 1 0  PHQ - 2 Score 2 0 2 2 0  Altered sleeping 1 0 0 0 0  Tired, decreased energy 0 0 1 2 0  Change in appetite 0 0 0 1 0  Feeling bad or failure about yourself  0 0 0 0 0  Trouble concentrating 0 0 0 0 0  Moving slowly or fidgety/restless 0 0 0 0 0  Suicidal thoughts 0 0 0 0 0  PHQ-9 Score 3 0 3 5 0  Difficult doing work/chores Not difficult at all Not difficult at all Not difficult at all Not difficult at all Not difficult at all      02/02/2024   11:02 AM 08/19/2023   11:27 AM 06/22/2023    9:25 AM 05/17/2023    9:49 AM  GAD 7 : Generalized Anxiety Score  Nervous, Anxious, on Edge 0 0 0 0  Control/stop worrying 0 0 0 0  Worry  too much - different things 0 1 2 1   Trouble relaxing 0 1 0 0  Restless 0 0 0 0  Easily annoyed or irritable 0 0 0 0  Afraid - awful might happen 0 0 0 0  Total GAD 7 Score 0 2 2 1   Anxiety Difficulty Not difficult at all Not difficult at all  Not difficult at all         Relevant past medical, surgical, family, and social history reviewed and updated as indicated.  Allergies and medications reviewed and updated. Data reviewed: Chart in Epic.   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  [name]   Lipoma    left upper arm   Mixed hyperlipidemia    Osteoporosis    Pericardial effusion    a. HCAP complicated by pericardial effusion requiring pericardial window 10/2016 and large L pleural effusion requring VATS.   Pleural effusion    a. s/p VATS 2017.   Pneumonia 10/2016   PONV (postoperative nausea and vomiting)    history of   Prolonged QT interval    Urine incontinence    UTI (lower urinary tract infection)    frequent   Vitamin D deficiency     Past Surgical History:  Procedure Laterality Date   BIOPSY  03/20/2022   Procedure: BIOPSY;  Surgeon: Lanelle Bal, DO;  [facility];  Service: Endoscopy;;  gastric lesion vs. gastric ulcer   BIOPSY  06/28/2022   Procedure: BIOPSY;  Surgeon: Lanelle Bal, DO;  [facility];  Service: Endoscopy;;   BIOPSY  05/10/2023   Procedure: BIOPSY;  Surgeon: Lanelle Bal, DO;  [facility];  Service: Endoscopy;;   BRAVO PH STUDY  12/04/2012   Procedure: BRAVO PH STUDY;  Surgeon: West Bali, MD;  [facility]: [facility];  Service: Endoscopy;  Laterality: N/A;   CHOLECYSTECTOMY N/A 01/03/2014   Procedure: LAPAROSCOPIC CHOLECYSTECTOMY WITH INTRAOPERATIVE CHOLANGIOGRAM;  Surgeon: Clovis Pu. Cornett, MD;  Location: [facility];  Service: General;  Laterality: N/A;   COLONOSCOPY  01/2004   [facility], few small tics   COLONOSCOPY WITH PROPOFOL N/A 05/10/2023   Procedure: COLONOSCOPY WITH PROPOFOL;  Surgeon: Lanelle Bal, DO;  [facility];  Service: Endoscopy;  Laterality: N/A;  1245pm, asa 3   ESOPHAGOGASTRODUODENOSCOPY  02/2010   gastric ulcers   ESOPHAGOGASTRODUODENOSCOPY  12/04/2012   [id number] gastritis (inflammation) was found in the gastric antrum; multiple biopsies The duodenal mucosa showed no abnormalities in the bulb and second portion of the duodenum   ESOPHAGOGASTRODUODENOSCOPY (EGD) WITH PROPOFOL N/A 03/20/2022   Procedure:  ESOPHAGOGASTRODUODENOSCOPY (EGD) WITH PROPOFOL;  Surgeon: Lanelle Bal, DO;  [facility];  Service: Endoscopy;  Laterality: N/A;   ESOPHAGOGASTRODUODENOSCOPY (EGD) WITH PROPOFOL N/A 06/28/2022   Procedure: ESOPHAGOGASTRODUODENOSCOPY (EGD) WITH PROPOFOL;  Surgeon: Lanelle Bal, DO;  [facility];  Service: Endoscopy;  Laterality: N/A;  800   ESOPHAGOGASTRODUODENOSCOPY (EGD) WITH PROPOFOL N/A 05/10/2023   Procedure: ESOPHAGOGASTRODUODENOSCOPY (EGD) WITH PROPOFOL;  Surgeon: Lanelle Bal, DO;  [facility];  Service: Endoscopy;  Laterality: N/A;   JOINT REPLACEMENT Right 10/22/2019   KYPHOPLASTY N/A 02/14/2019  Procedure: KYPHOPLASTY T12 and L1;  Surgeon: [name], MD;  Location: [facility];  Service: Orthopedics;  Laterality: N/A;  120 mins   LASIK     LIPOMA EXCISION  08/02/2011   left shoulder   NOSE SURGERY     PARTIAL HYSTERECTOMY     vaginal at age 39 years of age   POLYPECTOMY  05/10/2023   Procedure: POLYPECTOMY;  Surgeon: Lanelle Bal, DO;  [facility];  Service: Endoscopy;;   TOE DEBRIDEMENT Left 1962   lt great toe   TOTAL HIP ARTHROPLASTY Right 11/18/2021   Procedure: TOTAL HIP ARTHROPLASTY ANTERIOR APPROACH;  Surgeon: Ollen Gross, MD;  Location: [facility];  Service: Orthopedics;  Laterality: Right;   TOTAL HIP ARTHROPLASTY Left 06/29/2023   Procedure: LEFT TOTAL HIP ARTHROPLASTY ANTERIOR APPROACH;  Surgeon: Ollen Gross, MD;  Location: [facility];  Service: Orthopedics;  Laterality: Left;   TOTAL KNEE ARTHROPLASTY Right 10/22/2019   Procedure: TOTAL KNEE ARTHROPLASTY;  Surgeon: Ollen Gross, MD;  Location: [facility];  Service: Orthopedics;  Laterality: Right;    TOTAL KNEE ARTHROPLASTY Left 03/03/2020   Procedure: TOTAL KNEE ARTHROPLASTY;  Surgeon: Ollen Gross, MD;  Location: [facility];  Service: Orthopedics;  Laterality: Left;    TRANSFORAMINAL LUMBAR INTERBODY FUSION (TLIF) WITH PEDICLE SCREW FIXATION 1  LEVEL N/A 01/28/2021   Procedure: TRANSFORAMINAL LUMBAR INTERBODY FUSION (TLIF) LUMBAR FIVE-SACRAL ONE;  Surgeon: [name], MD;  Location: [facility];  Service: Orthopedics;  Laterality: N/A;  4 hrs   VIDEO ASSISTED THORACOSCOPY (VATS)/EMPYEMA Left 10/25/2016   Procedure: VIDEO ASSISTED THORACOSCOPY (VATS), BRONCH,DRAINAGE OF PLEURAL EFFUSION,PERICARDIAL WINDOW WITH DRAINAGE OF PERICARDIAL FLUID, TEE;  Surgeon: Loreli Slot, MD;  Location: [facility];  Service: Thoracic;  Laterality: Left;   VIDEO BRONCHOSCOPY N/A 10/25/2016   Procedure: VIDEO BRONCHOSCOPY;  Surgeon: Loreli Slot, MD;  Location: [facility];  Service: Thoracic;  Laterality: N/A;    Social History   Socioeconomic History   Marital status: Married    Spouse name: Greggory Stallion   Number of children: 2   Years of education: 12th grade   Highest education level: Not on file  Occupational History   Occupation: retired    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   2 granddaughters.   Retired HR.    Social Drivers of Corporate investment banker Strain: Low Risk  (05/23/2023)   Overall Financial Resource Strain (CARDIA)    Difficulty of Paying Living Expenses: Not hard at all  Food Insecurity: No Food Insecurity (06/29/2023)   Hunger Vital Sign    Worried About Running Out of Food in the Last Year: Never true    Ran Out of Food in the Last Year: Never true  Transportation Needs: No Transportation Needs (06/29/2023)   PRAPARE - Administrator, Civil Service (Medical): No    Lack of Transportation  (Non-Medical): No  Physical Activity: Insufficiently Active (05/23/2023)   Exercise Vital Sign    Days  of Exercise per Week: 3 days    Minutes of Exercise per Session: 30 min  Stress: No Stress Concern Present (05/23/2023)   [employer] of Occupational Health - Occupational Stress Questionnaire    Feeling of Stress : Not at all  Social Connections: Moderately Integrated (05/23/2023)   Social Connection and Isolation Panel [NHANES]    Frequency of Communication with Friends and Family: More than three times a week    Frequency of Social Gatherings with Friends and Family: More than three times a week    Attends Religious Services: More than 4 times per year    Active Member of [employer] or Organizations: No    Attends Banker Meetings: Never    Marital Status: Married  Catering manager Violence: Not At Risk (06/29/2023)   Humiliation, Afraid, Rape, and Kick questionnaire    Fear of Current or Ex-Partner: No    Emotionally Abused: No    Physically Abused: No    Sexually Abused: No    Outpatient Encounter Medications as of 02/22/2024  Medication Sig   cetirizine (ZYRTEC) 10 MG tablet Take 10 mg by mouth daily.   desvenlafaxine (PRISTIQ) 25 MG 24 hr tablet Take 1 tablet (25 mg total) by mouth daily.   fluticasone (FLONASE) 50 MCG/ACT nasal spray Place 2 sprays into both nostrils daily.   gabapentin (NEURONTIN) 100 MG capsule Take 1 capsule (100 mg total) by mouth 3 (three) times daily.   lactose free nutrition (BOOST) LIQD Take 237 mLs by mouth daily.   levothyroxine (SYNTHROID) 88 MCG tablet TAKE 1 TABLET BY MOUTH EVERY DAY BEFORE BREAKFAST   nilotinib (TASIGNA) 150 MG capsule Take 2 capsules (300 mg total) by mouth every 12 (twelve) hours.   penicillin v potassium (VEETID) 500 MG tablet Take 500 mg by mouth 3 (three) times daily.   Probiotic Product (DIGESTIVE ADV PREBIOT+PROBIOT) CHEW Chew 2 each by mouth daily.   rosuvastatin (CRESTOR) 5 MG tablet Take 1 tablet (5 mg  total) by mouth at bedtime.   [DISCONTINUED] citalopram (CELEXA) 20 MG tablet Take 1 tablet (20 mg total) by mouth daily.   [DISCONTINUED] benzonatate (TESSALON) 200 MG capsule Take 1 capsule (200 mg total) by mouth 3 (three) times daily as needed.   [DISCONTINUED] ondansetron (ZOFRAN) 4 MG tablet Take 1 tablet (4 mg total) by mouth every 8 (eight) hours as needed for nausea or vomiting.   [DISCONTINUED] oseltamivir (TAMIFLU) 75 MG capsule Take 1 capsule (75 mg total) by mouth 2 (two) times daily.   [DISCONTINUED] promethazine-dextromethorphan (PROMETHAZINE-DM) 6.25-15 MG/5ML syrup Take 5 mLs by mouth 3 (three) times daily as needed for cough.   No facility-administered encounter medications on file as of 02/22/2024.    Allergies  Allergen Reactions   Nucynta [Tapentadol Hcl] Swelling   Codeine Nausea Only and Other (See Comments)    Headache   Sulfonamide Derivatives Nausea Only and Other (See Comments)    Headache   Nexium [Esomeprazole Magnesium] Nausea Only   Sulfa Antibiotics Nausea Only    Headache    Fluconazole Rash    Redness and blistering on left thigh    Pertinent ROS per HPI, otherwise unremarkable      Objective:  BP 131/62   Pulse 86   Temp (!) 94.2 F (34.6 C)   Ht 5\' 5"  (1.651 m)   Wt 211 lb (95.7 kg)   SpO2 90%   BMI 35.11 kg/m    Wt Readings from Last 3 Encounters:  02/22/24 211 lb (  95.7 kg)  01/11/24 210 lb (95.3 kg)  12/27/23 207 lb (93.9 kg)    Physical Exam Vitals and nursing note reviewed.  Constitutional:      General: She is not in acute distress.    Appearance: She is obese. She is ill-appearing (chronically ill) and diaphoretic (chronic). She is not toxic-appearing.  HENT:     Head: Normocephalic and atraumatic.     Nose: Nose normal.     Mouth/Throat:     Mouth: Mucous membranes are moist.     Pharynx: Oropharynx is clear.  Eyes:     Conjunctiva/sclera: Conjunctivae normal.     Pupils: Pupils are equal, round, and reactive to  light.  Cardiovascular:     Rate and Rhythm: Normal rate and regular rhythm.     Heart sounds: Normal heart sounds.  Pulmonary:     Effort: Pulmonary effort is normal.     Breath sounds: Normal breath sounds.  Musculoskeletal:     Cervical back: Neck supple.     Right lower leg: No edema.     Left lower leg: No edema.  Skin:    General: Skin is warm.     Capillary Refill: Capillary refill takes less than 2 seconds.  Neurological:     General: No focal deficit present.     Mental Status: She is alert and oriented to person, place, and time.  Psychiatric:        Attention and Perception: Attention and perception normal.        Mood and Affect: Mood is anxious. Affect is tearful.        Speech: Speech is rapid and pressured.        Behavior: Behavior is cooperative.        Thought Content: Thought content normal.        Cognition and Memory: Cognition and memory normal.        Judgment: Judgment normal.       Results for orders placed or performed in visit on 02/02/24  RSV Ag, Immunochr, Waived   Collection Time: 02/02/24 11:12 AM   Specimen: Nasopharyngeal   Naso  Result Value Ref Range   RSV Ag, Immunochr, Waived Negative Negative  Veritor Flu A/B Waived   Collection Time: 02/02/24 11:12 AM  Result Value Ref Range   Influenza A Positive (A) Negative   Influenza B Negative Negative       Pertinent labs & imaging results that were available during my care of the patient were reviewed by me and considered in my medical decision making.  Assessment & Plan:  Jenah "Ellieanna Funderburg" was seen today for medical management of chronic issues and anxiety.  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) -     Anemia Profile B -     VITAMIN D 25 Hydroxy (Vit-D Deficiency, Fractures) -     CMP14+EGFR  Chronic diastolic congestive heart failure (HCC) -     Anemia Profile B -     VITAMIN D 25  Hydroxy (Vit-D Deficiency, Fractures) -     CMP14+EGFR  Atherosclerosis of aorta (HCC) -     Anemia Profile B -     VITAMIN D 25 Hydroxy (Vit-D Deficiency, Fractures) -     CMP14+EGFR  Vitamin D deficiency -     Anemia Profile B -     VITAMIN D 25 Hydroxy (Vit-D Deficiency, Fractures) -     CMP14+EGFR  Major depressive disorder, single episode, mild degree (HCC) -     desvenlafaxine (PRISTIQ) 25 MG 24 hr tablet; Take 1 tablet (25 mg total) by mouth daily. -     Anemia Profile B -     VITAMIN D 25 Hydroxy (Vit-D Deficiency, Fractures) -     CMP14+EGFR  GAD (generalized anxiety disorder) -     desvenlafaxine (PRISTIQ) 25 MG 24 hr tablet; Take 1 tablet (25 mg total) by mouth daily. 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 She is aware of the immunosuppressive effects and plans to wear a mask to reduce exposure to illnesses.  Postherpetic neuralgia Continues to experience thumb numbness following a shingles outbreak. Residual symptoms can persist for six months or longer, and in some cases, indefinitely.  Fatigue and sleep disturbances Reports significant fatigue and sleep disturbances, including difficulty waking up, staying awake during the day, and insomnia at night. Current medication regimen, including Celexa, may contribute to these symptoms. Considering the impact of chemotherapy on sleep and energy levels. Discussed switching from Celexa to  Pristiq, an SNRI, which may help with anxiety, depression, nervousness, and insomnia. - Discontinue Celexa - Start Pristiq - Order anemia profile, vitamin D, liver and renal function tests  Dental issues Experienced deterioration of gums and teeth, leading to the need for dental extractions and crowns. Attributes these issues to the effects of chemotherapy and expresses concern about the cost and discomfort of dental procedures.  General Health Maintenance Nearly 78 years old and managing multiple health conditions. Taking a cholesterol medication at night and mindful of diet to maintain weight and cholesterol levels. Plans to monitor cholesterol and thyroid function through endocrinologist, Dr. Kalman Shan. - Monitor cholesterol and thyroid function with Dr. Kalman Shan  Follow-up Scheduled for lab work with Dr. Kalman Shan and will follow up in six to eight weeks to assess the effectiveness of the new medication regimen. - Follow up in 6-8 weeks - Order lab work for anemia profile, vitamin D, liver and renal function tests         Continue all other maintenance medications.  Follow up plan: Return in about 6 weeks (around 04/04/2024), or if symptoms worsen or fail to improve, for Depression, Anxiety.   Continue healthy lifestyle choices, including diet (rich in fruits, vegetables, and lean proteins, and low in salt and simple carbohydrates) and exercise (at least 30 minutes of moderate physical activity daily).  Educational handout given for Pristiq   The above assessment and management plan was discussed with the patient. The patient verbalized understanding of and has agreed to the management plan. Patient is aware to call the clinic if they develop any new symptoms or if symptoms persist or worsen. Patient is aware when to return to the clinic for a follow-up visit. Patient educated on when it is appropriate to go to the emergency department.  Kari Baars, FNP-C [facility] [phone]

## 2024-02-23 DIAGNOSIS — N1831 Chronic kidney disease, stage 3a: Secondary | ICD-10-CM | POA: Insufficient documentation

## 2024-02-23 DIAGNOSIS — E039 Hypothyroidism, unspecified: Secondary | ICD-10-CM | POA: Diagnosis not present

## 2024-02-23 DIAGNOSIS — E782 Mixed hyperlipidemia: Secondary | ICD-10-CM | POA: Diagnosis not present

## 2024-02-23 LAB — ANEMIA PROFILE B
Basophils Absolute: 0 10*3/uL (ref 0.0–0.2)
Basos: 0 %
EOS (ABSOLUTE): 0.2 10*3/uL (ref 0.0–0.4)
Eos: 1 %
Ferritin: 390 ng/mL — ABNORMAL HIGH (ref 15–150)
Hematocrit: 36.5 % (ref 34.0–46.6)
Hemoglobin: 12.1 g/dL (ref 11.1–15.9)
Immature Grans (Abs): 0 10*3/uL (ref 0.0–0.1)
Immature Granulocytes: 0 %
Iron Saturation: 15 % (ref 15–55)
Iron: 38 ug/dL (ref 27–139)
Lymphocytes Absolute: 1.4 10*3/uL (ref 0.7–3.1)
Lymphs: 12 %
MCH: 28.9 pg (ref 26.6–33.0)
MCHC: 33.2 g/dL (ref 31.5–35.7)
MCV: 87 fL (ref 79–97)
Monocytes Absolute: 0.8 10*3/uL (ref 0.1–0.9)
Monocytes: 7 %
Neutrophils Absolute: 8.8 10*3/uL — ABNORMAL HIGH (ref 1.4–7.0)
Neutrophils: 80 %
Platelets: 300 10*3/uL (ref 150–450)
RBC: 4.19 x10E6/uL (ref 3.77–5.28)
RDW: 13.3 % (ref 11.7–15.4)
Retic Ct Pct: 1.5 % (ref 0.6–2.6)
Total Iron Binding Capacity: 257 ug/dL (ref 250–450)
UIBC: 219 ug/dL (ref 118–369)
Vitamin B-12: 1260 pg/mL — ABNORMAL HIGH (ref 232–1245)
WBC: 11.2 10*3/uL — ABNORMAL HIGH (ref 3.4–10.8)

## 2024-02-23 LAB — CMP14+EGFR
ALT: 20 IU/L (ref 0–32)
AST: 30 IU/L (ref 0–40)
Albumin: 4.5 g/dL (ref 3.8–4.8)
Alkaline Phosphatase: 175 IU/L — ABNORMAL HIGH (ref 44–121)
BUN/Creatinine Ratio: 23 (ref 12–28)
BUN: 26 mg/dL (ref 8–27)
Bilirubin Total: 0.5 mg/dL (ref 0.0–1.2)
CO2: 21 mmol/L (ref 20–29)
Calcium: 9.7 mg/dL (ref 8.7–10.3)
Chloride: 103 mmol/L (ref 96–106)
Creatinine, Ser: 1.11 mg/dL — ABNORMAL HIGH (ref 0.57–1.00)
Globulin, Total: 3 g/dL (ref 1.5–4.5)
Glucose: 94 mg/dL (ref 70–99)
Potassium: 5.1 mmol/L (ref 3.5–5.2)
Sodium: 141 mmol/L (ref 134–144)
Total Protein: 7.5 g/dL (ref 6.0–8.5)
eGFR: 51 mL/min/{1.73_m2} — ABNORMAL LOW (ref >59)

## 2024-02-23 LAB — VITAMIN D 25 HYDROXY (VIT D DEFICIENCY, FRACTURES): Vit D, 25-Hydroxy: 29.5 ng/mL — ABNORMAL LOW (ref 30.0–100.0)

## 2024-02-23 NOTE — Addendum Note (Signed)
 Addended by: [name] on: 02/23/2024 07:55 AM   Modules accepted: Orders

## 2024-02-24 ENCOUNTER — Ambulatory Visit: Payer: Self-pay | Admitting: Family Medicine

## 2024-02-24 LAB — T4, FREE: Free T4: 1.77 ng/dL (ref 0.82–1.77)

## 2024-02-24 LAB — LIPID PANEL
Chol/HDL Ratio: 3 ratio (ref 0.0–4.4)
Cholesterol, Total: 176 mg/dL (ref 100–199)
HDL: 58 mg/dL (ref >39)
LDL Chol Calc (NIH): 96 mg/dL (ref 0–99)
Triglycerides: 123 mg/dL (ref 0–149)
VLDL Cholesterol Cal: 22 mg/dL (ref 5–40)

## 2024-02-24 LAB — TSH: TSH: 0.041 u[IU]/mL — ABNORMAL LOW (ref 0.450–4.500)

## 2024-02-24 NOTE — Telephone Encounter (Signed)
 Patient aware of labs and tx plan

## 2024-02-24 NOTE — Telephone Encounter (Signed)
  Chief Complaint: Review lab results Symptoms: none Frequency: NA Pertinent Negatives: Patient denies all symptoms Disposition: [] ED /[] Urgent Care (no appt availability in office) / [] Appointment(In office/virtual)/ []  [place] [facility]/ [] Home Care/ [] Refused Recommended Disposition /[] [place] [facility]/ []  Follow-up with PCP. X N/A info only call.   Additional Notes:  Received lab results from agent and has questions. Wondering about her kidneys. Reviewed message in lab results and relayed to patient. She verbalized understanding and states she has a follow up with endocrinology scheduled and would like to discuss thyroid levels at that appointment. She plans to continue her Vit D supplement, will increase hydration and avoid NSAIDs as advised. No further questions or needs at this time.    Copied from CRM [id number]. Topic: Clinical - Lab/Test Results >> Feb 24, 2024  1:50 PM Albin Felling L wrote: Reason for CRM: Patient has additional questions about lab results. Reason for Disposition . Caller requesting routine or non-urgent lab result  Protocols used: PCP Call - No Triage-A-AH

## 2024-02-25 ENCOUNTER — Other Ambulatory Visit: Payer: Self-pay | Admitting: "Endocrinology

## 2024-02-27 ENCOUNTER — Other Ambulatory Visit: Payer: Self-pay | Admitting: "Endocrinology

## 2024-03-01 ENCOUNTER — Other Ambulatory Visit: Payer: Self-pay | Admitting: *Deleted

## 2024-03-01 DIAGNOSIS — E039 Hypothyroidism, unspecified: Secondary | ICD-10-CM

## 2024-03-01 MED ORDER — LEVOTHYROXINE SODIUM 88 MCG PO TABS: 88.0000 ug | ORAL_TABLET | Freq: Every day | ORAL | 0 refills | Status: DC

## 2024-03-02 ENCOUNTER — Encounter: Payer: Self-pay | Admitting: "Endocrinology

## 2024-03-02 ENCOUNTER — Ambulatory Visit: Payer: PPO | Admitting: "Endocrinology

## 2024-03-02 VITALS — BP 110/64 | HR 76 | Ht 65.0 in | Wt 209.8 lb

## 2024-03-02 DIAGNOSIS — E782 Mixed hyperlipidemia: Secondary | ICD-10-CM

## 2024-03-02 DIAGNOSIS — E039 Hypothyroidism, unspecified: Secondary | ICD-10-CM

## 2024-03-02 MED ORDER — ROSUVASTATIN CALCIUM 5 MG PO TABS: 5.0000 mg | ORAL_TABLET | Freq: Every evening | ORAL | 1 refills | Status: DC

## 2024-03-02 MED ORDER — LEVOTHYROXINE SODIUM 75 MCG PO TABS: 75.0000 ug | ORAL_TABLET | Freq: Every day | ORAL | 1 refills | Status: DC

## 2024-03-02 NOTE — Progress Notes (Signed)
 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 [name]   Lipoma    left upper arm   Mixed hyperlipidemia    Osteoporosis    Pericardial effusion    a. HCAP complicated by pericardial effusion requiring pericardial window 10/2016 and large L pleural effusion requring VATS.   Pleural effusion    a. s/p VATS 2017.   Pneumonia 10/2016   PONV (postoperative nausea and vomiting)    history of   Prolonged QT interval    Urine incontinence    UTI (lower urinary tract infection)    frequent   Vitamin D deficiency    Past Surgical History:  Procedure Laterality Date   BIOPSY  03/20/2022   Procedure: BIOPSY;  Surgeon: Lanelle Bal, DO;  [facility]: [facility];  Service: Endoscopy;;  gastric lesion vs. gastric ulcer   BIOPSY  06/28/2022   Procedure: BIOPSY;  Surgeon: Lanelle Bal, DO;  [facility]: [facility];  Service: Endoscopy;;   BIOPSY  05/10/2023   Procedure: BIOPSY;  Surgeon: Lanelle Bal, DO;  [facility]: [facility];  Service: Endoscopy;;   BRAVO PH STUDY  12/04/2012   Procedure: BRAVO PH STUDY;  Surgeon: West Bali, MD;  Location: [facility];  Service: Endoscopy;  Laterality: N/A;   CHOLECYSTECTOMY N/A 01/03/2014   Procedure: LAPAROSCOPIC CHOLECYSTECTOMY WITH INTRAOPERATIVE CHOLANGIOGRAM;  Surgeon: Clovis Pu. Cornett, MD;  Location: [facility];  Service: General;  Laterality: N/A;   COLONOSCOPY  01/2004   DR [facility], few small tics   COLONOSCOPY WITH PROPOFOL N/A 05/10/2023   Procedure: COLONOSCOPY WITH PROPOFOL;  Surgeon: Lanelle Bal, DO;  [facility]: [facility];   Service: Endoscopy;  Laterality: N/A;  1245pm, asa 3   ESOPHAGOGASTRODUODENOSCOPY  02/2010   gastric ulcers   ESOPHAGOGASTRODUODENOSCOPY  12/04/2012   [id number] gastritis (inflammation) was found in the gastric antrum; multiple biopsies The duodenal mucosa showed no abnormalities in the bulb and second portion of the duodenum   ESOPHAGOGASTRODUODENOSCOPY (EGD) WITH PROPOFOL N/A 03/20/2022   Procedure: ESOPHAGOGASTRODUODENOSCOPY (EGD) WITH PROPOFOL;  Surgeon: Lanelle Bal, DO;  [facility];  Service: Endoscopy;  Laterality: N/A;   ESOPHAGOGASTRODUODENOSCOPY (EGD) WITH PROPOFOL N/A 06/28/2022   Procedure: ESOPHAGOGASTRODUODENOSCOPY (EGD) WITH PROPOFOL;  Surgeon: Lanelle Bal, DO;  [facility];  Service: Endoscopy;  Laterality: N/A;  800   ESOPHAGOGASTRODUODENOSCOPY (EGD) WITH PROPOFOL N/A 05/10/2023   Procedure: ESOPHAGOGASTRODUODENOSCOPY (EGD) WITH PROPOFOL;  Surgeon: Lanelle Bal, DO;  [facility];  Service: Endoscopy;  Laterality: N/A;   JOINT REPLACEMENT Right 10/22/2019   KYPHOPLASTY N/A 02/14/2019   Procedure: KYPHOPLASTY T12 and L1;  Surgeon: [name], MD;  Location: [facility];  Service: Orthopedics;  Laterality: N/A;  120 mins   LASIK     LIPOMA EXCISION  08/02/2011   left shoulder   NOSE SURGERY     PARTIAL HYSTERECTOMY     vaginal at age 16 years of age   POLYPECTOMY  05/10/2023   Procedure: POLYPECTOMY;  Surgeon: Lanelle Bal, DO;  [facility]: [facility];  Service: Endoscopy;;   TOE DEBRIDEMENT Left 1962   lt great toe   TOTAL HIP ARTHROPLASTY Right 11/18/2021   Procedure: TOTAL HIP ARTHROPLASTY ANTERIOR APPROACH;  Surgeon: Ollen Gross, MD;  Location: [facility];  Service: Orthopedics;  Laterality: Right;   TOTAL HIP ARTHROPLASTY Left 06/29/2023   Procedure: LEFT TOTAL HIP ARTHROPLASTY ANTERIOR APPROACH;  Surgeon: Ollen Gross, MD;  Location: [facility];  Service: Orthopedics;  Laterality: Left;   TOTAL KNEE ARTHROPLASTY  Right 10/22/2019   Procedure: TOTAL KNEE ARTHROPLASTY;  Surgeon: Ollen Gross, MD;  Location: [facility];  Service: Orthopedics;  Laterality: Right;    TOTAL KNEE ARTHROPLASTY Left 03/03/2020   Procedure: TOTAL KNEE ARTHROPLASTY;  Surgeon: Ollen Gross, MD;  Location: [facility];  Service: Orthopedics;  Laterality: Left;    TRANSFORAMINAL LUMBAR INTERBODY FUSION (TLIF) WITH PEDICLE SCREW FIXATION 1 LEVEL N/A 01/28/2021   Procedure: TRANSFORAMINAL LUMBAR INTERBODY FUSION (TLIF) LUMBAR FIVE-SACRAL ONE;  Surgeon: [name], MD;  Location: [facility];  Service: Orthopedics;  Laterality: N/A;  4 hrs   VIDEO ASSISTED THORACOSCOPY (VATS)/EMPYEMA Left 10/25/2016   Procedure: VIDEO ASSISTED THORACOSCOPY (VATS), BRONCH,DRAINAGE OF PLEURAL EFFUSION,PERICARDIAL WINDOW WITH DRAINAGE OF PERICARDIAL FLUID, TEE;  Surgeon: Loreli Slot, MD;  Location: [facility];  Service: Thoracic;  Laterality: Left;   VIDEO BRONCHOSCOPY N/A 10/25/2016   Procedure: VIDEO BRONCHOSCOPY;  Surgeon: Loreli Slot, MD;  Location: [facility];  Service: Thoracic;  Laterality: N/A;   Social History   Socioeconomic History   Marital status: Married    Spouse name: Greggory Stallion   Number of children: 2   Years of education: 12th grade   Highest education level: Not on file  Occupational History   Occupation: retired    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   2 granddaughters.   Retired HR.    Social Drivers of Corporate investment banker Strain: Low Risk  (05/23/2023)   Overall Financial Resource Strain (CARDIA)     Difficulty of Paying Living Expenses: Not hard at all  Food Insecurity: No Food Insecurity (06/29/2023)   Hunger Vital Sign    Worried About Running Out of Food in the Last Year: Never true    Ran Out of Food in the Last Year: Never true  Transportation Needs: No Transportation Needs (06/29/2023)   PRAPARE - Administrator, Civil Service (Medical): No    Lack of Transportation (Non-Medical): No  Physical Activity: Insufficiently Active (05/23/2023)   Exercise Vital Sign    Days of Exercise per Week: 3 days    Minutes of Exercise per Session: 30 min  Stress: No Stress Concern Present (05/23/2023)   [employer] of Occupational Health - Occupational Stress Questionnaire    Feeling of Stress : Not at all  Social Connections: Moderately Integrated (05/23/2023)   Social Connection and Isolation Panel [NHANES]    Frequency of Communication with Friends and Family: More than  three times a week    Frequency of Social Gatherings with Friends and Family: More than three times a week    Attends Religious Services: More than 4 times per year    Active Member of [employer] or Organizations: No    Attends Banker Meetings: Never    Marital Status: Married   Outpatient Encounter Medications as of 03/02/2024  Medication Sig   Acetaminophen (ACETAMIN PO) Take by mouth as needed.   Cholecalciferol (VITAMIN D3 PO) Take 1 tablet by mouth daily.   Multiple Vitamins-Minerals (MULTIVITAMIN WOMEN PO) Take 1 tablet by mouth daily.   cetirizine (ZYRTEC) 10 MG tablet Take 10 mg by mouth daily.   desvenlafaxine (PRISTIQ) 25 MG 24 hr tablet Take 1 tablet (25 mg total) by mouth daily.   levothyroxine (SYNTHROID) 75 MCG tablet Take 1 tablet (75 mcg total) by mouth daily before breakfast.   nilotinib (TASIGNA) 150 MG capsule Take 2 capsules (300 mg total) by mouth every 12 (twelve) hours.   Probiotic Product (DIGESTIVE ADV PREBIOT+PROBIOT) CHEW Chew 2 each by mouth daily.   rosuvastatin  (CRESTOR) 5 MG tablet Take 1 tablet (5 mg total) by mouth at bedtime.   [DISCONTINUED] fluticasone (FLONASE) 50 MCG/ACT nasal spray Place 2 sprays into both nostrils daily.   [DISCONTINUED] gabapentin (NEURONTIN) 100 MG capsule Take 1 capsule (100 mg total) by mouth 3 (three) times daily.   [DISCONTINUED] lactose free nutrition (BOOST) LIQD Take 237 mLs by mouth daily.   [DISCONTINUED] levothyroxine (SYNTHROID) 88 MCG tablet Take 1 tablet (88 mcg total) by mouth daily before breakfast.   [DISCONTINUED] penicillin v potassium (VEETID) 500 MG tablet Take 500 mg by mouth 3 (three) times daily.   [DISCONTINUED] rosuvastatin (CRESTOR) 5 MG tablet Take 1 tablet (5 mg total) by mouth at bedtime.   No facility-administered encounter medications on file as of 03/02/2024.   ALLERGIES: Allergies  Allergen Reactions   Nucynta [Tapentadol Hcl] Swelling   Codeine Nausea Only and Other (See Comments)    Headache   Sulfonamide Derivatives Nausea Only and Other (See Comments)    Headache   Nexium [Esomeprazole Magnesium] Nausea Only   Sulfa Antibiotics Nausea Only    Headache    Fluconazole Rash    Redness and blistering on left thigh    VACCINATION STATUS: Immunization History  Administered Date(s) Administered   Fluad Quad(high Dose 65+) 09/11/2020, 09/24/2021   Influenza,inj,Quad PF,6+ Mos 09/13/2017   Influenza-Unspecified 09/24/2015, 11/24/2017, 10/04/2023   Moderna Sars-Covid-2 Vaccination 04/02/2020, 04/30/2020, 01/07/2021   Pneumococcal Conjugate-13 10/09/2014   Pneumococcal Polysaccharide-23 10/09/2014    HPI  78 year old female patient with medical history as above.   She is being seen in follow-up for hypothyroidism.  She is currently on levothyroxine 88 mcg p.o. daily before breakfast.  She reports consistency and compliance with her medication.  Her previsit labs are consistent with slight over replacement.   She denies chest pain, shortness of breath. She denies palpitations,  tremors, nor heat intolerance.   She is still on nilotinib for CML.    -She has family history of thyroid problem involving goiter which required thyroidectomy in her mother who took thyroid hormone replacement subsequently. -She denies dysphagia, shortness of breath, voice change. -She is compliant to her medications.  She also has significant dyslipidemia, not on treatment. -She has uncontrolled dyslipidemia, tolerating low-dose Crestor.   She could not engage with the lifestyle medicine nutrition discussed during her last visit.    Review of Systems Limited as above.  Objective:    BP 110/64   Pulse 76   Ht 5\' 5"  (1.651 m)   Wt 209 lb 12.8 oz (95.2 kg)   BMI 34.91 kg/m   Wt Readings from Last 3 Encounters:  03/02/24 209 lb 12.8 oz (95.2 kg)  02/22/24 211 lb (95.7 kg)  01/11/24 210 lb (95.3 kg)     CMP     Component Value Date/Time   NA 141 02/22/2024 1017   K 5.1 02/22/2024 1017   CL 103 02/22/2024 1017   CO2 21 02/22/2024 1017   GLUCOSE 94 02/22/2024 1017   GLUCOSE 99 11/21/2023 1349   BUN 26 02/22/2024 1017   CREATININE 1.11 (H) 02/22/2024 1017   CREATININE 0.87 07/14/2011 0938   CALCIUM 9.7 02/22/2024 1017   PROT 7.5 02/22/2024 1017   ALBUMIN 4.5 02/22/2024 1017   AST 30 02/22/2024 1017   ALT 20 02/22/2024 1017   ALKPHOS 175 (H) 02/22/2024 1017   BILITOT 0.5 02/22/2024 1017   GFRNONAA 51 (L) 11/21/2023 1349   GFRAA >60 08/21/2020 1340   Diabetic Labs (most recent): Lab Results  Component Value Date   HGBA1C 5.2 06/22/2023   HGBA1C 5.1 10/30/2021   HGBA1C 5.3 04/24/2019   Lipid Panel     Component Value Date/Time   CHOL 176 02/23/2024 0951   TRIG 123 02/23/2024 0951   HDL 58 02/23/2024 0951   CHOLHDL 3.0 02/23/2024 0951   LDLCALC 96 02/23/2024 0951   LABVLDL 22 02/23/2024 0951    Latest Reference Range & Units 02/02/23 13:48  TSH 0.450 - 4.500 uIU/mL 0.819  T4,Free(Direct) 0.82 - 1.77 ng/dL 6.64    Assessment & Plan:   1. Hypothyroidism  2.   Hyperlipidemia - Her thyroid function tests are consistent with slight over replacement.  I discussed and lowered her levothyroxine to 75 mcg p.o. daily before breakfast.     - We discussed about the correct intake of her thyroid hormone, on empty stomach at fasting, with water, separated by at least 30 minutes from breakfast and other medications,  and separated by more than 4 hours from calcium, iron, multivitamins, acid reflux medications (PPIs). -Patient is made aware of the fact that thyroid hormone replacement is needed for life, dose to be adjusted by periodic monitoring of thyroid function tests.    Regarding her hyperlipidemia: She could not engage with lifestyle medicine nutrition discussed with her.  She is tolerating Crestor 5 mg p.o. nightly with improvement in her lipid panel.  She is advised to continue.   Side effects and precautions discussed with her.     - Her clinical exam has been  negative for goiter, hence no need for imaging of the thyroid at this time. She is advised to continue vitamin D3 4000 units daily, take her Centrum multivitamin every other day to lower her vitamin B12 which is supraphysiologic at this time.  - I advised patient to maintain close follow up with Rakes, Doralee Albino, FNP for primary care needs.   I spent  22  minutes in the care of the patient today including review of labs from Thyroid Function, CMP, and other relevant labs ; imaging/biopsy records (current and previous including abstractions from other facilities); face-to-face time discussing  her lab results and symptoms, medications doses, her options of short and long term treatment based on the latest standards of care / guidelines;   and documenting the encounter.  Andrea Martinez  participated in the discussions, expressed understanding, and voiced agreement  with the above plans.  All questions were answered to her satisfaction. she is encouraged to contact clinic should she have any questions or  concerns prior to her return visit.    Follow up plan: Return in about 6 months (around 09/02/2024) for Fasting Labs  in AM B4 8.  Marquis Lunch, MD Phone: [phone]  Fax: [phone]  -  This note was partially dictated with voice recognition software. Similar sounding words can be transcribed inadequately or may not  be corrected upon review.  03/02/2024, 11:46 AM

## 2024-03-07 ENCOUNTER — Telehealth: Payer: Self-pay | Admitting: *Deleted

## 2024-03-07 ENCOUNTER — Other Ambulatory Visit: Payer: Self-pay | Admitting: *Deleted

## 2024-03-07 DIAGNOSIS — C9210 Chronic myeloid leukemia, BCR/ABL-positive, not having achieved remission: Secondary | ICD-10-CM

## 2024-03-07 NOTE — Telephone Encounter (Addendum)
 She has decided that she would like to go off of her nilotinib, as discussed at previous appointment.  Will stop today.  Per Dr [name], RTC 6 weeks with BCR/ABL PCR/CBCD 2 weeks prior.  Patient made aware and appointments made.  Verbalized understanding.

## 2024-03-07 NOTE — Telephone Encounter (Signed)
 Marland Kitchen

## 2024-03-13 ENCOUNTER — Telehealth: Payer: Self-pay | Admitting: Cardiology

## 2024-03-13 NOTE — Telephone Encounter (Signed)
 Pt did not want to schedule at this time- she has to many appointments   Recall deleted

## 2024-03-20 ENCOUNTER — Other Ambulatory Visit: Payer: Self-pay | Admitting: Family Medicine

## 2024-03-20 DIAGNOSIS — F411 Generalized anxiety disorder: Secondary | ICD-10-CM

## 2024-03-20 DIAGNOSIS — F320 Major depressive disorder, single episode, mild: Secondary | ICD-10-CM

## 2024-03-27 ENCOUNTER — Telehealth: Payer: Self-pay | Admitting: *Deleted

## 2024-03-27 NOTE — Telephone Encounter (Signed)
 She stopped Nilotinib on 3/26. She is experiencing fatigue, nausea and insomnia, which can be side effects of coming off of medication.  Per Dr. Katragadda, if she can handle side effects at this point, they should get better over the next few weeks.  Patient has follow up with labs scheduled and states that she will continue as planned and call if symptoms worsen.

## 2024-04-05 ENCOUNTER — Inpatient Hospital Stay: Attending: Hematology

## 2024-04-05 DIAGNOSIS — C9210 Chronic myeloid leukemia, BCR/ABL-positive, not having achieved remission: Secondary | ICD-10-CM | POA: Diagnosis not present

## 2024-04-05 DIAGNOSIS — D649 Anemia, unspecified: Secondary | ICD-10-CM | POA: Insufficient documentation

## 2024-04-05 LAB — CBC WITH DIFFERENTIAL/PLATELET
Abs Immature Granulocytes: 0.06 10*3/uL (ref 0.00–0.07)
Basophils Absolute: 0 10*3/uL (ref 0.0–0.1)
Basophils Relative: 0 %
Eosinophils Absolute: 0.2 10*3/uL (ref 0.0–0.5)
Eosinophils Relative: 2 %
HCT: 40 % (ref 36.0–46.0)
Hemoglobin: 12.9 g/dL (ref 12.0–15.0)
Immature Granulocytes: 1 %
Lymphocytes Relative: 22 %
Lymphs Abs: 2.5 10*3/uL (ref 0.7–4.0)
MCH: 28.9 pg (ref 26.0–34.0)
MCHC: 32.3 g/dL (ref 30.0–36.0)
MCV: 89.7 fL (ref 80.0–100.0)
Monocytes Absolute: 0.9 10*3/uL (ref 0.1–1.0)
Monocytes Relative: 8 %
Neutro Abs: 7.7 10*3/uL (ref 1.7–7.7)
Neutrophils Relative %: 67 %
Platelets: 304 10*3/uL (ref 150–400)
RBC: 4.46 MIL/uL (ref 3.87–5.11)
RDW: 13.6 % (ref 11.5–15.5)
WBC: 11.4 10*3/uL — ABNORMAL HIGH (ref 4.0–10.5)
nRBC: 0 % (ref 0.0–0.2)

## 2024-04-10 LAB — BCR-ABL1, CML/ALL, PCR, QUANT
E1A2 Transcript: <0.0032 %
Interpretation (BCRAL):: NEGATIVE
b2a2 transcript: <0.0032 %
b3a2 transcript: <0.0032 %

## 2024-04-18 ENCOUNTER — Ambulatory Visit: Admitting: Family Medicine

## 2024-04-18 NOTE — Progress Notes (Signed)
 [facility] [address], [place] [address]    Clinic Day:  04/19/2024  Referring physician: Galvin Jules, FNP  Patient Care Team: Andrea Jules, FNP as PCP - General (Family Medicine) Andrea Boros, [facility] as Medical Oncologist (Medical Oncology) Andrea Bolt, [facility] as Consulting Physician (Endocrinology) Andrea Ards, Martinez as Consulting Physician (Orthopedic Surgery)   ASSESSMENT & PLAN:   Assessment: 1.  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 -Left pleural effusion and pericardial effusion requiring pericardial window on 10/25/2016.  Fluid cytology negative. -Started back on Nilotinib  300 mg every 12 hours on 12/06/2016. - She stopped nilotinib  on 03/07/2024   2.  Daytime sweats: -He had a long history of daytime sweats.  She had extensive work-up including a CT scan CAP, which showed no evidence of malignancy.  Hepatic morphology suspicious for cirrhosis.    Plan: 1.  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 - She has developed sweating, chills, nausea and decreased sleep since then.  Symptoms are gradually improving. - I reviewed labs from 04/05/2024: CBC was normal.  White count is mildly elevated at 11.4 with normal differential. - BCR/ABL by quantitative PCR on 04/05/2024 was undetectable. - She will continue to be off of her nilotinib .  I will reevaluate her in 5 weeks with repeat BCR/ABL by PCR and CBCD.  She will need close monitoring with PCR test every month for the first 6 months after discontinuation of nilotinib .  I discussed that about 50% of people continue to stay off of therapy at the end of 1 year. - For intermittent nausea, I have sent a prescription for Compazine  10 mg every 6 hours as needed.  2.  Normocytic anemia: - She had Feraheme  in March.  Hemoglobin is stable at 12.9.  No indication for parenteral iron therapy.  Last ferritin was 390.    Orders Placed This Encounter   Procedures   CBC with Differential    Standing Status:   Future    Expected Date:   05/17/2024    Expiration Date:   04/19/2025   BCR-ABL1, CML/ALL, PCR, QUANT    Standing Status:   Future    Expected Date:   05/17/2024    Expiration Date:   04/19/2025      Andrea [facility],acting as a scribe for Andrea Boros, Martinez.,have documented all relevant documentation on the behalf of Andrea Boros, Martinez,as directed by  Andrea Boros, Martinez while in the presence of Andrea Boros, Martinez.  I, Andrea Martinez, have reviewed the above documentation for accuracy and completeness, and I agree with the above.    Andrea Boros, Martinez   5/8/202511:18 AM  CHIEF COMPLAINT:   Diagnosis: CML    Cancer Staging  No matching staging information was found for the patient.    Prior Therapy: Hydrea 1000 mg and Allopurinol 300 mg daily, Tasigna  discontinued on 03/07/2024  Current Therapy: Close surveillance   HISTORY OF PRESENT ILLNESS:   Oncology History  CML (chronic myeloid leukemia) (HCC)  11/21/2015 Initial Diagnosis   Chronic myelogenous leukemia (CML), BCR-ABL1-positive (HCC)   11/21/2015 Miscellaneous   Seen by Andrea [facility]   11/21/2015 -  Chemotherapy   Hydrea 1000 mg and Allopurinol 300 mg daily (due to high uric acid level)   11/21/2015 Tumor Marker   BCR P210 Ratio of Fusion to Control, Blood 0.8941    P210 IS % Ratio, Blood 85.834%  01/02/2016 - 10/23/2016 Chemotherapy   Tasigna    01/08/2016 Miscellaneous   EKG- QT/QTc: 382/440 ms   01/16/2016 Miscellaneous   EKG- QT/QTc: 364/419   03/30/2016 Tumor Marker   b2a2 transcript: 0.317 b3a2 transcript: 0.194   10/23/2016 - 11/05/2016 [facility] Admission   large Left Pleural Effusion and Pericardial Effusion that is now s/p VATS with Drainage, and Drainage of Pericardial Fluid with Window and Biopsy of Pericardium   12/06/2016 -  Chemotherapy   Tasigna  restarted    05/24/2017 Adverse Reaction   Prolonged  QTc on EKG; likely d/t chemo. Tasigna  ON HOLD until next visit.    06/10/2017 -  Chemotherapy   Resumed Tasigna    07/13/2017 Miscellaneous   Evaluated by Cardiology for prolonged QTc. Per Dr. Wanetta Martinez, patient's QT intervals are not prolonged to the point of potentially promoting a ventricular arrhythmia. He thinks she can continue on current medical therapy for her CML with serial ECG monitoring.   12/27/2017 Miscellaneous   EKG: QT/QTc 398/435      INTERVAL HISTORY:   Andrea Martinez is a 78 y.o. female presenting to clinic today for follow up of CML. She was last seen by me on 11/30/23.  Today, she states that she is doing well overall. Her appetite level is at 100%. Her energy level is at 25%.  PAST MEDICAL HISTORY:   Past Medical History: Past Medical History:  Diagnosis Date   Anxiety    denies   Arthritis    Chronic myelogenous leukemia (CML), 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 Christobal Martinez   Lipoma    left upper arm   Mixed hyperlipidemia    Osteoporosis    Pericardial effusion    a. HCAP complicated by pericardial effusion requiring pericardial window 10/2016 and large L pleural effusion requring VATS.   Pleural effusion    a. s/p VATS 2017.   Pneumonia 10/2016   PONV (postoperative nausea and vomiting)    history of   Prolonged QT interval    Urine incontinence    UTI (lower urinary tract infection)    frequent   Vitamin D  deficiency     Surgical History: Past Surgical History:  Procedure Laterality Date   BIOPSY  03/20/2022   Procedure: BIOPSY;  Surgeon: Andrea Greening, Andrea [facility];  [facility];  Service: Endoscopy;;  gastric lesion vs. gastric ulcer   BIOPSY  06/28/2022   Procedure: BIOPSY;  Surgeon: Andrea Greening, Andrea Martinez;  [facility];  Service: Endoscopy;;   BIOPSY  05/10/2023   Procedure: BIOPSY;  Surgeon: Andrea Greening,  Andrea Martinez;  [facility];  Service: Endoscopy;;   BRAVO PH STUDY  12/04/2012   Procedure: BRAVO PH STUDY;  Surgeon: Andrea Jubilee, Martinez;  [facility];  Service: Endoscopy;  Laterality: N/A;   CHOLECYSTECTOMY N/A 01/03/2014   Procedure: LAPAROSCOPIC CHOLECYSTECTOMY WITH INTRAOPERATIVE CHOLANGIOGRAM;  Surgeon: Brandy Cal. Cornett, Martinez;  Location: [facility];  Service: General;  Laterality: N/A;   COLONOSCOPY  01/2004   DR [facility], few small tics   COLONOSCOPY WITH PROPOFOL  N/A 05/10/2023   Procedure: COLONOSCOPY WITH PROPOFOL ;  Surgeon: Andrea Greening, Andrea [facility];  [facility];  Service: Endoscopy;  Laterality: N/A;  1245pm, asa 3   ESOPHAGOGASTRODUODENOSCOPY  02/2010   gastric ulcers   ESOPHAGOGASTRODUODENOSCOPY  12/04/2012   [id number] gastritis (  inflammation) was found in the gastric antrum; multiple biopsies The duodenal mucosa showed no abnormalities in the bulb and second portion of the duodenum   ESOPHAGOGASTRODUODENOSCOPY (EGD) WITH PROPOFOL  N/A 03/20/2022   Procedure: ESOPHAGOGASTRODUODENOSCOPY (EGD) WITH PROPOFOL ;  Surgeon: Andrea Greening, Andrea [facility];  [facility];  Service: Endoscopy;  Laterality: N/A;   ESOPHAGOGASTRODUODENOSCOPY (EGD) WITH PROPOFOL  N/A 06/28/2022   Procedure: ESOPHAGOGASTRODUODENOSCOPY (EGD) WITH PROPOFOL ;  Surgeon: Andrea Greening, Andrea Martinez;  [facility];  Service: Endoscopy;  Laterality: N/A;  800   ESOPHAGOGASTRODUODENOSCOPY (EGD) WITH PROPOFOL  N/A 05/10/2023   Procedure: ESOPHAGOGASTRODUODENOSCOPY (EGD) WITH PROPOFOL ;  Surgeon: Andrea Greening, Andrea [facility];  [facility];  Service: Endoscopy;  Laterality: N/A;   JOINT REPLACEMENT Right 10/22/2019   KYPHOPLASTY N/A 02/14/2019   Procedure: KYPHOPLASTY T12 and L1;  Surgeon: Andrea Ards, Martinez;  Location: [facility];  Service: Orthopedics;  Laterality: N/A;  120 mins   LASIK     LIPOMA EXCISION  08/02/2011   left shoulder   NOSE SURGERY     PARTIAL HYSTERECTOMY      vaginal at age 23 years of age   POLYPECTOMY  05/10/2023   Procedure: POLYPECTOMY;  Surgeon: Andrea Greening, Andrea [facility];  [facility];  Service: Endoscopy;;   TOE DEBRIDEMENT Left 1962   lt great toe   TOTAL HIP ARTHROPLASTY Right 11/18/2021   Procedure: TOTAL HIP ARTHROPLASTY ANTERIOR APPROACH;  Surgeon: Liliane Rei, Martinez;  Location: [facility];  Service: Orthopedics;  Laterality: Right;   TOTAL HIP ARTHROPLASTY Left 06/29/2023   Procedure: LEFT TOTAL HIP ARTHROPLASTY ANTERIOR APPROACH;  Surgeon: Liliane Rei, Martinez;  Location: [facility];  Service: Orthopedics;  Laterality: Left;   TOTAL KNEE ARTHROPLASTY Right 10/22/2019   Procedure: TOTAL KNEE ARTHROPLASTY;  Surgeon: Liliane Rei, Martinez;  Location: [facility];  Service: Orthopedics;  Laterality: Right;    TOTAL KNEE ARTHROPLASTY Left 03/03/2020   Procedure: TOTAL KNEE ARTHROPLASTY;  Surgeon: Liliane Rei, Martinez;  Location: [facility];  Service: Orthopedics;  Laterality: Left;    TRANSFORAMINAL LUMBAR INTERBODY FUSION (TLIF) WITH PEDICLE SCREW FIXATION 1 LEVEL N/A 01/28/2021   Procedure: TRANSFORAMINAL LUMBAR INTERBODY FUSION (TLIF) LUMBAR FIVE-SACRAL ONE;  Surgeon: Andrea Ards, [facility];  Location: [facility];  Service: Orthopedics;  Laterality: N/A;  4 hrs   VIDEO ASSISTED THORACOSCOPY (VATS)/EMPYEMA Left 10/25/2016   Procedure: VIDEO ASSISTED THORACOSCOPY (VATS), BRONCH,DRAINAGE OF PLEURAL EFFUSION,PERICARDIAL WINDOW WITH DRAINAGE OF PERICARDIAL FLUID, TEE;  Surgeon: Zelphia Higashi, Martinez;  Location: [facility];  Service: Thoracic;  Laterality: Left;   VIDEO BRONCHOSCOPY N/A 10/25/2016   Procedure: VIDEO BRONCHOSCOPY;  Surgeon: Zelphia Higashi, Martinez;  Location: [facility];  Service: Thoracic;  Laterality: N/A;    Social History: Social History   Socioeconomic History   Marital status: Married    Spouse name: Caretha Chapel   Number of children: 2   Years of education: 12th grade   Highest education level: Not on file  Occupational History    Occupation: retired    Associate Professor: UENPLOYED    Employer: RETIRED  Tobacco Use   Smoking status: Never    Passive exposure: Never   Smokeless tobacco: Never  Vaping Use   Vaping status: Never Used  Substance and Sexual Activity   Alcohol use: No    Alcohol/week: 0.0 standard drinks of alcohol   Drug use: No   Sexual activity: Yes    Partners: Male    Birth control/protection: None  Other Topics Concern   Not on  file  Social History Narrative   1 son, 1 daughter.   2 granddaughters.   Retired HR.    Social Drivers of Corporate investment banker Strain: Low Risk  (05/23/2023)   Overall Financial Resource Strain (CARDIA)    Difficulty of Paying Living Expenses: Not hard at all  Food Insecurity: No Food Insecurity (06/29/2023)   Hunger Vital Sign    Worried About Running Out of Food in the Last Year: Never true    Ran Out of Food in the Last Year: Never true  Transportation Needs: No Transportation Needs (06/29/2023)   PRAPARE - Administrator, Civil Service (Medical): No    Lack of Transportation (Non-Medical): No  Physical Activity: Insufficiently Active (05/23/2023)   Exercise Vital Sign    Days of Exercise per Week: 3 days    Minutes of Exercise per Session: 30 min  Stress: No Stress Concern Present (05/23/2023)   [employer] of Occupational Health - Occupational Stress Questionnaire    Feeling of Stress : Not at all  Social Connections: Moderately Integrated (05/23/2023)   Social Connection and Isolation Panel [NHANES]    Frequency of Communication with Friends and Family: More than three times a week    Frequency of Social Gatherings with Friends and Family: More than three times a week    Attends Religious Services: More than 4 times per year    Active Member of [employer] or Organizations: No    Attends Banker Meetings: Never    Marital Status: Married  Catering manager Violence: Not At Risk (06/29/2023)   Humiliation, Afraid, Rape, and Kick  questionnaire    Fear of Current or Ex-Partner: No    Emotionally Abused: No    Physically Abused: No    Sexually Abused: No    Family History: Family History  Problem Relation Age of Onset   Lung cancer Father    COPD Mother    Stroke Mother    Thyroid  disease Mother    Colon cancer Neg Hx    Anesthesia problems Neg Hx    Hypotension Neg Hx    Malignant hyperthermia Neg Hx    Pseudochol deficiency Neg Hx     Current Medications:  Current Outpatient Medications:    Acetaminophen  (ACETAMIN PO), Take by mouth as needed., Disp: , Rfl:    cetirizine (ZYRTEC) 10 MG tablet, Take 10 mg by mouth daily., Disp: , Rfl:    desvenlafaxine  (PRISTIQ ) 25 MG 24 hr tablet, TAKE 1 TABLET (25 MG TOTAL) BY MOUTH DAILY., Disp: 90 tablet, Rfl: 0   levothyroxine  (SYNTHROID ) 75 MCG tablet, Take 1 tablet (75 mcg total) by mouth daily before breakfast., Disp: 90 tablet, Rfl: 1   Multiple Vitamins-Minerals (MULTIVITAMIN WOMEN PO), Take 1 tablet by mouth daily., Disp: , Rfl:    nilotinib  (TASIGNA ) 150 MG capsule, Take 2 capsules (300 mg total) by mouth every 12 (twelve) hours., Disp: 120 capsule, Rfl: 2   Probiotic Product (DIGESTIVE ADV PREBIOT+PROBIOT) CHEW, Chew 2 each by mouth daily., Disp: , Rfl:    prochlorperazine  (COMPAZINE ) 10 MG tablet, Take 1 tablet (10 mg total) by mouth every 6 (six) hours as needed for nausea or vomiting., Disp: 60 tablet, Rfl: 2   rosuvastatin  (CRESTOR ) 5 MG tablet, Take 1 tablet (5 mg total) by mouth at bedtime., Disp: 90 tablet, Rfl: 1   Allergies: Allergies  Allergen Reactions   Nucynta [Tapentadol Hcl] Swelling   Codeine Nausea Only and Other (See Comments)  Headache   Sulfonamide Derivatives Nausea Only and Other (See Comments)    Headache   Nexium [Esomeprazole Magnesium ] Nausea Only   Sulfa Antibiotics Nausea Only    Headache    Fluconazole Rash    Redness and blistering on left thigh    REVIEW OF SYSTEMS:   Review of Systems  Constitutional:  Negative  for chills, fatigue and fever.  HENT:   Negative for lump/mass, mouth sores, nosebleeds, sore throat and trouble swallowing.   Eyes:  Negative for eye problems.  Respiratory:  Negative for cough and shortness of breath.   Cardiovascular:  Negative for chest pain, leg swelling and palpitations.  Gastrointestinal:  Negative for abdominal pain, constipation, diarrhea, nausea and vomiting.  Genitourinary:  Negative for bladder incontinence, difficulty urinating, dysuria, frequency, hematuria and nocturia.   Musculoskeletal:  Negative for arthralgias, back pain, flank pain, myalgias and neck pain.  Skin:  Negative for itching and rash.  Neurological:  Positive for numbness. Negative for dizziness and headaches.  Hematological:  Does not bruise/bleed easily.  Psychiatric/Behavioral:  Positive for sleep disturbance. Negative for depression and suicidal ideas. The patient is not nervous/anxious.   All other systems reviewed and are negative.    VITALS:   Blood pressure (!) 145/93, pulse 88, temperature (!) 97.2 F (36.2 C), temperature source Tympanic, resp. rate 18, height 5\' 5"  (1.651 m), weight 215 lb (97.5 kg), SpO2 98%.  Wt Readings from Last 3 Encounters:  04/19/24 215 lb (97.5 kg)  03/02/24 209 lb 12.8 oz (95.2 kg)  02/22/24 211 lb (95.7 kg)    Body mass index is 35.78 kg/m.  Performance status (ECOG): 1 - Symptomatic but completely ambulatory  PHYSICAL EXAM:   Physical Exam Vitals and nursing note reviewed. Exam conducted with a chaperone present.  Constitutional:      Appearance: Normal appearance.  Cardiovascular:     Rate and Rhythm: Normal rate and regular rhythm.     Pulses: Normal pulses.     Heart sounds: Normal heart sounds.  Pulmonary:     Effort: Pulmonary effort is normal.     Breath sounds: Normal breath sounds.  Abdominal:     Palpations: Abdomen is soft. There is no hepatomegaly, splenomegaly or mass.     Tenderness: There is no abdominal tenderness.   Musculoskeletal:     Right lower leg: No edema.     Left lower leg: No edema.  Lymphadenopathy:     Cervical: No cervical adenopathy.     Right cervical: No superficial, deep or posterior cervical adenopathy.    Left cervical: No superficial, deep or posterior cervical adenopathy.     Upper Body:     Right upper body: No supraclavicular or axillary adenopathy.     Left upper body: No supraclavicular or axillary adenopathy.  Neurological:     General: No focal deficit present.     Mental Status: She is alert and oriented to person, place, and time.  Psychiatric:        Mood and Affect: Mood normal.        Behavior: Behavior normal.    LABS:      Latest Ref Rng & Units 04/05/2024   10:58 AM 02/22/2024   10:17 AM 11/21/2023    1:49 PM  CBC  WBC 4.0 - 10.5 K/uL 11.4  11.2  12.8   Hemoglobin 12.0 - 15.0 g/dL 40.9  81.1  91.4   Hematocrit 36.0 - 46.0 % 40.0  36.5  38.6   Platelets  150 - 400 K/uL 304  300  311       Latest Ref Rng & Units 02/22/2024   10:17 AM 11/21/2023    1:49 PM 09/21/2023    2:35 PM  CMP  Glucose 70 - 99 mg/dL 94  99  161   BUN 8 - 27 mg/dL 26  29  22    Creatinine 0.57 - 1.00 mg/dL 0.96  0.45  4.09   Sodium 134 - 144 mmol/L 141  141  141   Potassium 3.5 - 5.2 mmol/L 5.1  5.0  4.8   Chloride 96 - 106 mmol/L 103  106  104   CO2 20 - 29 mmol/L 21  24  19    Calcium  8.7 - 10.3 mg/dL 9.7  9.6  9.4   Total Protein 6.0 - 8.5 g/dL 7.5  8.1  7.5   Total Bilirubin 0.0 - 1.2 mg/dL 0.5  0.6  0.6   Alkaline Phos 44 - 121 IU/L 175  151  184   AST 0 - 40 IU/L 30  34  22   ALT 0 - 32 IU/L 20  22  19       No results found for: "CEA1", "CEA" / No results found for: "CEA1", "CEA" No results found for: "PSA1" No results found for: "WJX914" No results found for: "CAN125"  No results found for: "TOTALPROTELP", "ALBUMINELP", "A1GS", "A2GS", "BETS", "BETA2SER", "GAMS", "MSPIKE", "SPEI" Lab Results  Component Value Date   TIBC 257 02/22/2024   TIBC 311 11/21/2023   TIBC  259 09/21/2023   FERRITIN 390 (H) 02/22/2024   FERRITIN 197 11/21/2023   FERRITIN 506 (H) 09/21/2023   IRONPCTSAT 15 02/22/2024   IRONPCTSAT 11 11/21/2023   IRONPCTSAT 16 09/21/2023   Lab Results  Component Value Date   LDH 147 11/21/2023   LDH 164 07/26/2023   LDH 150 04/27/2023     STUDIES:   No results found.

## 2024-04-19 ENCOUNTER — Inpatient Hospital Stay: Attending: Hematology | Admitting: Hematology

## 2024-04-19 VITALS — BP 148/90 | HR 88 | Temp 97.2°F | Resp 18 | Ht 65.0 in | Wt 215.0 lb

## 2024-04-19 DIAGNOSIS — C9210 Chronic myeloid leukemia, BCR/ABL-positive, not having achieved remission: Secondary | ICD-10-CM | POA: Diagnosis not present

## 2024-04-19 DIAGNOSIS — R110 Nausea: Secondary | ICD-10-CM | POA: Insufficient documentation

## 2024-04-19 DIAGNOSIS — D649 Anemia, unspecified: Secondary | ICD-10-CM | POA: Insufficient documentation

## 2024-04-19 DIAGNOSIS — Z801 Family history of malignant neoplasm of trachea, bronchus and lung: Secondary | ICD-10-CM | POA: Insufficient documentation

## 2024-04-19 DIAGNOSIS — Z9221 Personal history of antineoplastic chemotherapy: Secondary | ICD-10-CM | POA: Insufficient documentation

## 2024-04-19 MED ORDER — PROCHLORPERAZINE MALEATE 10 MG PO TABS: 10.0000 mg | ORAL_TABLET | Freq: Four times a day (QID) | ORAL | 2 refills | Status: DC | PRN

## 2024-04-19 NOTE — Patient Instructions (Addendum)
 [facility] at [facility] Discharge Instructions   You were seen and examined today by Dr. Cheree Cords.  He reviewed the results of your lab work which are normal/stable.   Continue Tasigna  as prescribed.   We will see you back in 5 weeks.   Return as scheduled.    Thank you for choosing [facility] at [facility] to provide your oncology and hematology care.  To afford each patient quality time with our provider, please arrive at least 15 minutes before your scheduled appointment time.   If you have a lab appointment with the Cancer Center please come in thru the Main Entrance and check in at the main information desk.  You need to re-schedule your appointment should you arrive 10 or more minutes late.  We strive to give you quality time with our providers, and arriving late affects you and other patients whose appointments are after yours.  Also, if you no show three or more times for appointments you may be dismissed from the clinic at the providers discretion.     Again, thank you for choosing [facility]  Our hope is that these requests will decrease the amount of time that you wait before being seen by our physicians.       _____________________________________________________________  Should you have questions after your visit to [facility], please contact our office at [phone] and follow the prompts.  Our office hours are 8:00 a.m. and 4:30 p.m. Monday - Friday.  Please note that voicemails left after 4:00 p.m. may not be returned until the following business day.  We are closed weekends and major holidays.  You do have access to a nurse 24-7, just call the main number to the clinic [phone] and do not press any options, hold on the line and a nurse will answer the phone.    For prescription refill requests, have your pharmacy contact our office and allow 72 hours.    Due to Covid, you will need to  wear a mask upon entering the hospital. If you do not have a mask, a mask will be given to you at the Main Entrance upon arrival. For doctor visits, patients may have 1 support person age 29 or older with them. For treatment visits, patients can not have anyone with them due to social distancing guidelines and our immunocompromised population.

## 2024-04-20 ENCOUNTER — Ambulatory Visit (INDEPENDENT_AMBULATORY_CARE_PROVIDER_SITE_OTHER): Admitting: Family Medicine

## 2024-04-20 ENCOUNTER — Encounter: Payer: Self-pay | Admitting: Family Medicine

## 2024-04-20 VITALS — BP 127/68 | HR 100 | Temp 96.6°F | Ht 65.0 in | Wt 216.4 lb

## 2024-04-20 DIAGNOSIS — F411 Generalized anxiety disorder: Secondary | ICD-10-CM | POA: Diagnosis not present

## 2024-04-20 DIAGNOSIS — N1831 Chronic kidney disease, stage 3a: Secondary | ICD-10-CM | POA: Diagnosis not present

## 2024-04-20 DIAGNOSIS — C9210 Chronic myeloid leukemia, BCR/ABL-positive, not having achieved remission: Secondary | ICD-10-CM | POA: Diagnosis not present

## 2024-04-20 DIAGNOSIS — N3941 Urge incontinence: Secondary | ICD-10-CM

## 2024-04-20 DIAGNOSIS — E039 Hypothyroidism, unspecified: Secondary | ICD-10-CM | POA: Diagnosis not present

## 2024-04-20 DIAGNOSIS — F339 Major depressive disorder, recurrent, unspecified: Secondary | ICD-10-CM

## 2024-04-20 NOTE — Progress Notes (Signed)
 Subjective:  Patient ID: Andrea Martinez, female    DOB: 01/11/1946, 78 y.o.   MRN: [id number]  Patient Care Team: Galvin Jules, FNP as PCP - General (Family Medicine) Paulett Boros, MD as Medical Oncologist (Medical Oncology) Baby Bolt, MD as Consulting Physician (Endocrinology) Mort Ards, MD as Consulting Physician (Orthopedic Surgery)   Chief Complaint:  Anxiety and Depression (6 week follow up )   HPI: Andrea Martinez is a 78 y.o. female presenting on 04/20/2024 for Anxiety and Depression (6 week follow up )   History of Present Illness   Andrea Martinez "Andrea Martinez" is a 78 year old female who presents with concerns about medication side effects and urinary issues.  She has gained four pounds since March and is concerned that her current medication may be contributing to this weight gain. Difficulty engaging in exercise due to knee and hip issues impacts her ability to manage her weight.  She has been taking a medication daily for eight weeks, which was intended to be evaluated after this period. However, she received an unexpected refill of a previous prescription that was supposed to be discontinued. She has three bottles of the current medication and has been taking it as prescribed.  She received a call from a pharmacy about a cardiologist appointment, which she was unaware of. She recalls having an echocardiogram and EKGs previously, with results indicating good heart function. She also received a referral to a nephrologist due to declining kidney function, with elevated creatinine and decreased GFR noted since March.  She describes urinary issues, including bladder spasms and leakage, which require her to wear protective garments. She experiences urgency and frequency, particularly when exercising, and reports that her sleep is disrupted by these symptoms. She has not yet seen a urologist for these issues.  She has been off chemotherapy for six weeks,  experiencing withdrawal symptoms such as shakes, cold chills, nausea, dry mouth, and altered sleep patterns. She was prescribed medication for nausea and reports that some symptoms have improved, though she still experiences sweating and chills.  Her thyroid  medication was adjusted to 75 mg, and she notes a weight gain of four pounds since the change. She is scheduled to follow up with her thyroid  specialist later this month.           04/20/2024   11:24 AM 02/22/2024    9:57 AM 02/02/2024   11:02 AM 08/19/2023   11:28 AM 06/22/2023    9:26 AM  Depression screen PHQ 2/9  Decreased Interest 1 1 0 1 1  Down, Depressed, Hopeless 0 1 0 1 1  PHQ - 2 Score 1 2 0 2 2  Altered sleeping 1 1 0 0 0  Tired, decreased energy 1 0 0 1 2  Change in appetite 1 0 0 0 1  Feeling bad or failure about yourself  0 0 0 0 0  Trouble concentrating 0 0 0 0 0  Moving slowly or fidgety/restless 0 0 0 0 0  Suicidal thoughts 0 0 0 0 0  PHQ-9 Score 4 3 0 3 5  Difficult doing work/chores Not difficult at all Not difficult at all Not difficult at all Not difficult at all Not difficult at all      04/20/2024   11:25 AM 02/02/2024   11:02 AM 08/19/2023   11:27 AM 06/22/2023    9:25 AM  GAD 7 : Generalized Anxiety Score  Nervous, Anxious, on Edge 0 0 0  0  Control/stop worrying 1 0 0 0  Worry too much - different things 1 0 1 2  Trouble relaxing 0 0 1 0  Restless 0 0 0 0  Easily annoyed or irritable 0 0 0 0  Afraid - awful might happen 0 0 0 0  Total GAD 7 Score 2 0 2 2  Anxiety Difficulty Not difficult at all Not difficult at all Not difficult at all        Relevant past medical, surgical, family, and social history reviewed and updated as indicated.  Allergies and medications reviewed and updated. Data reviewed: Chart in Epic.   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 Christobal Craft   Lipoma    left upper arm   Mixed hyperlipidemia    Osteoporosis    Pericardial effusion    a. HCAP complicated by pericardial effusion requiring pericardial window 10/2016 and large L pleural effusion requring VATS.   Pleural effusion    a. s/p VATS 2017.   Pneumonia 10/2016   PONV (postoperative nausea and vomiting)    history of   Prolonged QT interval    Urine incontinence    UTI (lower urinary tract infection)    frequent   Vitamin D  deficiency     Past Surgical History:  Procedure Laterality Date   BIOPSY  03/20/2022   Procedure: BIOPSY;  Surgeon: Vinetta Greening, DO;  [facility]: [facility];  Service: Endoscopy;;  gastric lesion vs. gastric ulcer   BIOPSY  06/28/2022   Procedure: BIOPSY;  Surgeon: Vinetta Greening, DO;  [facility];  Service: Endoscopy;;   BIOPSY  05/10/2023   Procedure: BIOPSY;  Surgeon: Vinetta Greening, DO;  [facility]: [facility];  Service: Endoscopy;;   BRAVO PH STUDY  12/04/2012   Procedure: BRAVO PH STUDY;  Surgeon: Alyce Jubilee, MD;  [facility]: [facility];  Service: Endoscopy;  Laterality: N/A;   CHOLECYSTECTOMY N/A 01/03/2014   Procedure: LAPAROSCOPIC CHOLECYSTECTOMY WITH INTRAOPERATIVE CHOLANGIOGRAM;  Surgeon: Brandy Cal. Cornett, MD;  Location: [facility];  Service: General;  Laterality: N/A;   COLONOSCOPY  01/2004   DR [facility], few small tics   COLONOSCOPY WITH PROPOFOL  N/A 05/10/2023   Procedure: COLONOSCOPY WITH PROPOFOL ;  Surgeon: Vinetta Greening, DO;  [facility]: [facility];  Service: Endoscopy;  Laterality: N/A;  1245pm, asa 3   ESOPHAGOGASTRODUODENOSCOPY  02/2010   gastric ulcers   ESOPHAGOGASTRODUODENOSCOPY  12/04/2012   [id number] gastritis (inflammation) was found in the gastric antrum; multiple biopsies The duodenal mucosa showed no abnormalities in the bulb and second portion of the duodenum    ESOPHAGOGASTRODUODENOSCOPY (EGD) WITH PROPOFOL  N/A 03/20/2022   Procedure: ESOPHAGOGASTRODUODENOSCOPY (EGD) WITH PROPOFOL ;  Surgeon: Vinetta Greening, DO;  [facility];  Service: Endoscopy;  Laterality: N/A;   ESOPHAGOGASTRODUODENOSCOPY (EGD) WITH PROPOFOL  N/A 06/28/2022   Procedure: ESOPHAGOGASTRODUODENOSCOPY (EGD) WITH PROPOFOL ;  Surgeon: Vinetta Greening, DO;  [facility];  Service: Endoscopy;  Laterality: N/A;  800   ESOPHAGOGASTRODUODENOSCOPY (EGD) WITH PROPOFOL  N/A 05/10/2023   Procedure: ESOPHAGOGASTRODUODENOSCOPY (EGD) WITH PROPOFOL ;  Surgeon: Vinetta Greening, DO;  [facility];  Service: Endoscopy;  Laterality: N/A;   JOINT REPLACEMENT  Right 10/22/2019   KYPHOPLASTY N/A 02/14/2019   Procedure: KYPHOPLASTY T12 and L1;  Surgeon: Mort Ards, MD;  Location: [facility];  Service: Orthopedics;  Laterality: N/A;  120 mins   LASIK     LIPOMA EXCISION  08/02/2011   left shoulder   NOSE SURGERY     PARTIAL HYSTERECTOMY     vaginal at age 18 years of age   POLYPECTOMY  05/10/2023   Procedure: POLYPECTOMY;  Surgeon: Vinetta Greening, DO;  [facility]: [facility];  Service: Endoscopy;;   TOE DEBRIDEMENT Left 1962   lt great toe   TOTAL HIP ARTHROPLASTY Right 11/18/2021   Procedure: TOTAL HIP ARTHROPLASTY ANTERIOR APPROACH;  Surgeon: Liliane Rei, MD;  Location: [facility];  Service: Orthopedics;  Laterality: Right;   TOTAL HIP ARTHROPLASTY Left 06/29/2023   Procedure: LEFT TOTAL HIP ARTHROPLASTY ANTERIOR APPROACH;  Surgeon: Liliane Rei, MD;  Location: [facility];  Service: Orthopedics;  Laterality: Left;   TOTAL KNEE ARTHROPLASTY Right 10/22/2019   Procedure: TOTAL KNEE ARTHROPLASTY;  Surgeon: Liliane Rei, MD;  Location: [facility];  Service: Orthopedics;  Laterality: Right;    TOTAL KNEE ARTHROPLASTY Left 03/03/2020   Procedure: TOTAL KNEE ARTHROPLASTY;  Surgeon: Liliane Rei, MD;  Location: [facility];  Service: Orthopedics;  Laterality: Left;     TRANSFORAMINAL LUMBAR INTERBODY FUSION (TLIF) WITH PEDICLE SCREW FIXATION 1 LEVEL N/A 01/28/2021   Procedure: TRANSFORAMINAL LUMBAR INTERBODY FUSION (TLIF) LUMBAR FIVE-SACRAL ONE;  Surgeon: Mort Ards, MD;  Location: [facility];  Service: Orthopedics;  Laterality: N/A;  4 hrs   VIDEO ASSISTED THORACOSCOPY (VATS)/EMPYEMA Left 10/25/2016   Procedure: VIDEO ASSISTED THORACOSCOPY (VATS), BRONCH,DRAINAGE OF PLEURAL EFFUSION,PERICARDIAL WINDOW WITH DRAINAGE OF PERICARDIAL FLUID, TEE;  Surgeon: Zelphia Higashi, MD;  Location: [facility];  Service: Thoracic;  Laterality: Left;   VIDEO BRONCHOSCOPY N/A 10/25/2016   Procedure: VIDEO BRONCHOSCOPY;  Surgeon: Zelphia Higashi, MD;  Location[facility];  Service: Thoracic;  Laterality: N/A;    Social History   Socioeconomic History   Marital status: Married    Spouse name: Caretha Chapel   Number of children: 2   Years of education: 12th grade   Highest education level: Not on file  Occupational History   Occupation: retired    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   2 granddaughters.   Retired HR.    Social Drivers of Corporate investment banker Strain: Low Risk  (05/23/2023)   Overall Financial Resource Strain (CARDIA)    Difficulty of Paying Living Expenses: Not hard at all  Food Insecurity: No Food Insecurity (06/29/2023)   Hunger Vital Sign    Worried About Running Out of Food in the Last Year: Never true    Ran Out of Food in the Last Year: Never true  Transportation Needs: No Transportation Needs (06/29/2023)   PRAPARE - Therapist, art (Medical): No    Lack of Transportation (Non-Medical): No  Physical Activity: Insufficiently Active (05/23/2023)  Exercise Vital Sign    Days of Exercise per Week: 3 days    Minutes of Exercise per Session: 30 min  Stress: No Stress Concern Present (05/23/2023)   [employer] of Occupational Health - Occupational Stress Questionnaire    Feeling of Stress : Not at all  Social Connections: Moderately Integrated (05/23/2023)   Social Connection and Isolation Panel [NHANES]    Frequency of Communication with Friends and Family: More than three times a week    Frequency of Social Gatherings with Friends and Family: More than three times a week    Attends Religious Services: More than 4 times per year    Active Member of [employer] or Organizations: No    Attends Banker Meetings: Never    Marital Status: Married  Catering manager Violence: Not At Risk (06/29/2023)   Humiliation, Afraid, Rape, and Kick questionnaire    Fear of Current or Ex-Partner: No    Emotionally Abused: No    Physically Abused: No    Sexually Abused: No    Outpatient Encounter Medications as of 04/20/2024  Medication Sig   Acetaminophen  (ACETAMIN PO) Take by mouth as needed.   cetirizine (ZYRTEC) 10 MG tablet Take 10 mg by mouth daily.   desvenlafaxine  (PRISTIQ ) 25 MG 24 hr tablet TAKE 1 TABLET (25 MG TOTAL) BY MOUTH DAILY.   levothyroxine  (SYNTHROID ) 75 MCG tablet Take 1 tablet (75 mcg total) by mouth daily before breakfast.   Multiple Vitamins-Minerals (MULTIVITAMIN WOMEN PO) Take 1 tablet by mouth daily.   nilotinib  (TASIGNA ) 150 MG capsule Take 2 capsules (300 mg total) by mouth every 12 (twelve) hours.   Probiotic Product (DIGESTIVE ADV PREBIOT+PROBIOT) CHEW Chew 2 each by mouth daily.   prochlorperazine  (COMPAZINE ) 10 MG tablet Take 1 tablet (10 mg total) by mouth every 6 (six) hours as needed for nausea or vomiting.   rosuvastatin  (CRESTOR ) 5 MG tablet Take 1 tablet (5 mg total)  by mouth at bedtime.   No facility-administered encounter medications on file as of 04/20/2024.    Allergies  Allergen Reactions   Nucynta [Tapentadol Hcl] Swelling   Codeine Nausea Only and Other (See Comments)    Headache   Sulfonamide Derivatives Nausea Only and Other (See Comments)    Headache   Nexium [Esomeprazole Magnesium ] Nausea Only   Sulfa Antibiotics Nausea Only    Headache    Fluconazole Rash    Redness and blistering on left thigh    Pertinent ROS per HPI, otherwise unremarkable      Objective:  BP 127/68   Pulse 100   Temp (!) 96.6 F (35.9 C)   Ht 5\' 5"  (1.651 m)   Wt 216 lb 6.4 oz (98.2 kg)   SpO2 97%   BMI 36.01 kg/m    Wt Readings from Last 3 Encounters:  04/20/24 216 lb 6.4 oz (98.2 kg)  04/19/24 215 lb (97.5 kg)  03/02/24 209 lb 12.8 oz (95.2 kg)    Physical Exam Vitals and nursing note reviewed.  Constitutional:      General: She is not in acute distress.    Appearance: She is obese. She is ill-appearing (chronically ill). She is not toxic-appearing or diaphoretic.  HENT:     Head: Normocephalic and atraumatic.     Nose: Nose normal.     Mouth/Throat:     Mouth: Mucous membranes are moist.  Eyes:     Conjunctiva/sclera: Conjunctivae normal.     Pupils: Pupils are equal, round, and reactive to  light.  Cardiovascular:     Rate and Rhythm: Normal rate and regular rhythm.     Heart sounds: Normal heart sounds.  Pulmonary:     Effort: Pulmonary effort is normal.     Breath sounds: Normal breath sounds.  Musculoskeletal:     Cervical back: Neck supple.     Right lower leg: No edema.     Left lower leg: No edema.  Skin:    General: Skin is warm and dry.     Capillary Refill: Capillary refill takes less than 2 seconds.  Neurological:     General: No focal deficit present.     Mental Status: She is alert and oriented to person, place, and time.  Psychiatric:        Attention and Perception: Attention and perception normal.        Mood  and Affect: Affect normal. Mood is anxious.        Speech: Speech is rapid and pressured.        Behavior: Behavior normal. Behavior is cooperative.        Thought Content: Thought content normal.        Cognition and Memory: Cognition and memory normal.        Judgment: Judgment normal.      Results for orders placed or performed in visit on 04/05/24  CBC with Differential/Platelet   Collection Time: 04/05/24 10:58 AM  Result Value Ref Range   WBC 11.4 (H) 4.0 - 10.5 K/uL   RBC 4.46 3.87 - 5.11 MIL/uL   Hemoglobin 12.9 12.0 - 15.0 g/dL   HCT 45.4 09.8 - 11.9 %   MCV 89.7 80.0 - 100.0 fL   MCH 28.9 26.0 - 34.0 pg   MCHC 32.3 30.0 - 36.0 g/dL   RDW 14.7 82.9 - 56.2 %   Platelets 304 150 - 400 K/uL   nRBC 0.0 0.0 - 0.2 %   Neutrophils Relative % 67 %   Neutro Abs 7.7 1.7 - 7.7 K/uL   Lymphocytes Relative 22 %   Lymphs Abs 2.5 0.7 - 4.0 K/uL   Monocytes Relative 8 %   Monocytes Absolute 0.9 0.1 - 1.0 K/uL   Eosinophils Relative 2 %   Eosinophils Absolute 0.2 0.0 - 0.5 K/uL   Basophils Relative 0 %   Basophils Absolute 0.0 0.0 - 0.1 K/uL   Immature Granulocytes 1 %   Abs Immature Granulocytes 0.06 0.00 - 0.07 K/uL  BCR-ABL1, CML/ALL, PCR, QUANT   Collection Time: 04/05/24 10:59 AM  Result Value Ref Range   b2a2 transcript <0.0032 % %   b3a2 transcript <0.0032 % %   E1A2 Transcript <0.0032 % %   Interpretation (BCRAL): Negative    Director Review [facility]): Comment    Background: Comment    Methodology Comment    References (BCRAL) Comment        Pertinent labs & imaging results that were available during my care of the patient were reviewed by me and considered in my medical decision making.  Assessment & Plan:  Andrea "Gwili Felmlee" was seen today for anxiety and depression.  Diagnoses and all orders for this visit:  Depression, recurrent (HCC) GAD (generalized anxiety disorder) Continue Pristiq  as prescribed.   Urge incontinence of urine -     Ambulatory referral  to Urology  CML (chronic myeloid leukemia) (HCC) Keep follow up with oncology.   Acquired hypothyroidism Will repeat labs at next visit.   CKD Has not followed up with nephrology  as referred. Aware she needs to follow up.     Chronic kidney disease Chronic kidney disease with declining kidney function, elevated creatinine, and decreased GFR. Referral to nephrology was made in March. Discussion about avoiding NSAIDs due to her potential impact on kidney function. Nephrology will likely perform specialized blood work to further evaluate kidney function. - Continue referral to nephrology for further evaluation and management. - Avoid NSAIDs such as ibuprofen, Advil, Aleve , and Motrin.  Urinary incontinence Urinary incontinence with bladder spasms and leakage, exacerbated by physical activity. No prior follow-up with urology. Discussion about the potential benefits of kegel exercises for bladder control. Referral to urology for further evaluation and management. - Refer to urology for further evaluation and management. - Encourage kegel exercises to improve bladder control.  Withdrawal symptoms from medication Withdrawal symptoms after discontinuation of long-term medication, including shakes, cold chills, nausea, dry mouth, and altered sleep patterns. Symptoms have improved but persist. Discussion about expected gradual improvement over time.  Nausea Persistent nausea for six weeks, likely related to withdrawal from long-term medication. Prescription for nausea management provided.  General Health Maintenance Discussion about weight gain and the importance of physical activity for overall health. Encouragement to continue walking as tolerated despite joint issues. - Encourage physical activity, such as walking, as tolerated to manage weight and improve overall health.           Continue all other maintenance medications.  Follow up plan: Return in about 3 months (around 07/21/2024),  or if symptoms worsen or fail to improve, for chronic follow up.   Continue healthy lifestyle choices, including diet (rich in fruits, vegetables, and lean proteins, and low in salt and simple carbohydrates) and exercise (at least 30 minutes of moderate physical activity daily).  Educational handout given for Kegel exercises   The above assessment and management plan was discussed with the patient. The patient verbalized understanding of and has agreed to the management plan. Patient is aware to call the clinic if they develop any new symptoms or if symptoms persist or worsen. Patient is aware when to return to the clinic for a follow-up visit. Patient educated on when it is appropriate to go to the emergency department.   Kattie Parrot, FNP-C [facility] [phone]

## 2024-05-10 DIAGNOSIS — N1831 Chronic kidney disease, stage 3a: Secondary | ICD-10-CM | POA: Diagnosis not present

## 2024-05-10 DIAGNOSIS — E559 Vitamin D deficiency, unspecified: Secondary | ICD-10-CM | POA: Diagnosis not present

## 2024-05-10 DIAGNOSIS — C9291 Myeloid leukemia, unspecified in remission: Secondary | ICD-10-CM | POA: Diagnosis not present

## 2024-05-10 DIAGNOSIS — I129 Hypertensive chronic kidney disease with stage 1 through stage 4 chronic kidney disease, or unspecified chronic kidney disease: Secondary | ICD-10-CM | POA: Diagnosis not present

## 2024-05-11 DIAGNOSIS — R300 Dysuria: Secondary | ICD-10-CM | POA: Diagnosis not present

## 2024-05-11 DIAGNOSIS — N189 Chronic kidney disease, unspecified: Secondary | ICD-10-CM | POA: Diagnosis not present

## 2024-05-11 DIAGNOSIS — R809 Proteinuria, unspecified: Secondary | ICD-10-CM | POA: Diagnosis not present

## 2024-05-11 DIAGNOSIS — D631 Anemia in chronic kidney disease: Secondary | ICD-10-CM | POA: Diagnosis not present

## 2024-05-14 ENCOUNTER — Other Ambulatory Visit (HOSPITAL_COMMUNITY): Payer: Self-pay | Admitting: Nephrology

## 2024-05-14 DIAGNOSIS — N1831 Chronic kidney disease, stage 3a: Secondary | ICD-10-CM

## 2024-05-16 ENCOUNTER — Inpatient Hospital Stay: Attending: Hematology

## 2024-05-16 ENCOUNTER — Inpatient Hospital Stay: Payer: PPO

## 2024-05-16 DIAGNOSIS — Z9221 Personal history of antineoplastic chemotherapy: Secondary | ICD-10-CM | POA: Diagnosis not present

## 2024-05-16 DIAGNOSIS — D649 Anemia, unspecified: Secondary | ICD-10-CM | POA: Insufficient documentation

## 2024-05-16 DIAGNOSIS — Z79899 Other long term (current) drug therapy: Secondary | ICD-10-CM | POA: Diagnosis not present

## 2024-05-16 DIAGNOSIS — C9210 Chronic myeloid leukemia, BCR/ABL-positive, not having achieved remission: Secondary | ICD-10-CM | POA: Insufficient documentation

## 2024-05-16 DIAGNOSIS — Z801 Family history of malignant neoplasm of trachea, bronchus and lung: Secondary | ICD-10-CM | POA: Diagnosis not present

## 2024-05-16 DIAGNOSIS — D509 Iron deficiency anemia, unspecified: Secondary | ICD-10-CM

## 2024-05-16 LAB — CBC WITH DIFFERENTIAL/PLATELET
Abs Immature Granulocytes: 0.05 10*3/uL (ref 0.00–0.07)
Basophils Absolute: 0 10*3/uL (ref 0.0–0.1)
Basophils Relative: 0 %
Eosinophils Absolute: 0.1 10*3/uL (ref 0.0–0.5)
Eosinophils Relative: 1 %
HCT: 38.1 % (ref 36.0–46.0)
Hemoglobin: 12.1 g/dL (ref 12.0–15.0)
Immature Granulocytes: 1 %
Lymphocytes Relative: 17 %
Lymphs Abs: 1.8 10*3/uL (ref 0.7–4.0)
MCH: 28 pg (ref 26.0–34.0)
MCHC: 31.8 g/dL (ref 30.0–36.0)
MCV: 88.2 fL (ref 80.0–100.0)
Monocytes Absolute: 0.7 10*3/uL (ref 0.1–1.0)
Monocytes Relative: 7 %
Neutro Abs: 7.5 10*3/uL (ref 1.7–7.7)
Neutrophils Relative %: 74 %
Platelets: 252 10*3/uL (ref 150–400)
RBC: 4.32 MIL/uL (ref 3.87–5.11)
RDW: 12.8 % (ref 11.5–15.5)
WBC: 10.1 10*3/uL (ref 4.0–10.5)
nRBC: 0 % (ref 0.0–0.2)

## 2024-05-16 LAB — COMPREHENSIVE METABOLIC PANEL WITH GFR
ALT: 19 U/L (ref 0–44)
AST: 32 U/L (ref 15–41)
Albumin: 3.9 g/dL (ref 3.5–5.0)
Alkaline Phosphatase: 130 U/L — ABNORMAL HIGH (ref 38–126)
Anion gap: 10 (ref 5–15)
BUN: 27 mg/dL — ABNORMAL HIGH (ref 8–23)
CO2: 23 mmol/L (ref 22–32)
Calcium: 9.6 mg/dL (ref 8.9–10.3)
Chloride: 104 mmol/L (ref 98–111)
Creatinine, Ser: 1.07 mg/dL — ABNORMAL HIGH (ref 0.44–1.00)
GFR, Estimated: 53 mL/min — ABNORMAL LOW (ref >60)
Glucose, Bld: 87 mg/dL (ref 70–99)
Potassium: 4.6 mmol/L (ref 3.5–5.1)
Sodium: 137 mmol/L (ref 135–145)
Total Bilirubin: 0.4 mg/dL (ref 0.0–1.2)
Total Protein: 7.8 g/dL (ref 6.5–8.1)

## 2024-05-16 LAB — IRON AND TIBC
Iron: 41 ug/dL (ref 28–170)
Saturation Ratios: 12 % (ref 10.4–31.8)
TIBC: 339 ug/dL (ref 250–450)
UIBC: 298 ug/dL

## 2024-05-16 LAB — LACTATE DEHYDROGENASE: LDH: 128 U/L (ref 98–192)

## 2024-05-16 LAB — FERRITIN: Ferritin: 150 ng/mL (ref 11–307)

## 2024-05-17 ENCOUNTER — Inpatient Hospital Stay

## 2024-05-17 ENCOUNTER — Ambulatory Visit (HOSPITAL_COMMUNITY)
Admission: RE | Admit: 2024-05-17 | Discharge: 2024-05-17 | Disposition: A | Discharge status: Home / Self Care | Source: Ambulatory Visit | Attending: Nephrology | Admitting: Nephrology

## 2024-05-17 DIAGNOSIS — N1831 Chronic kidney disease, stage 3a: Secondary | ICD-10-CM | POA: Diagnosis not present

## 2024-05-17 DIAGNOSIS — N189 Chronic kidney disease, unspecified: Secondary | ICD-10-CM | POA: Diagnosis not present

## 2024-05-20 LAB — BCR-ABL1, CML/ALL, PCR, QUANT
E1A2 Transcript: <0.0032 %
Interpretation (BCRAL):: NEGATIVE
b2a2 transcript: <0.0032 %
b3a2 transcript: <0.0032 %

## 2024-05-21 ENCOUNTER — Other Ambulatory Visit (HOSPITAL_COMMUNITY)

## 2024-05-22 DIAGNOSIS — I5032 Chronic diastolic (congestive) heart failure: Secondary | ICD-10-CM | POA: Diagnosis not present

## 2024-05-22 DIAGNOSIS — I129 Hypertensive chronic kidney disease with stage 1 through stage 4 chronic kidney disease, or unspecified chronic kidney disease: Secondary | ICD-10-CM | POA: Diagnosis not present

## 2024-05-22 DIAGNOSIS — N390 Urinary tract infection, site not specified: Secondary | ICD-10-CM | POA: Diagnosis not present

## 2024-05-22 DIAGNOSIS — N1831 Chronic kidney disease, stage 3a: Secondary | ICD-10-CM | POA: Diagnosis not present

## 2024-05-23 ENCOUNTER — Encounter (HOSPITAL_COMMUNITY): Payer: Self-pay

## 2024-05-23 ENCOUNTER — Other Ambulatory Visit: Payer: Self-pay

## 2024-05-23 ENCOUNTER — Ambulatory Visit (INDEPENDENT_AMBULATORY_CARE_PROVIDER_SITE_OTHER): Payer: PPO

## 2024-05-23 ENCOUNTER — Emergency Department (HOSPITAL_COMMUNITY)
Admission: EM | Admit: 2024-05-23 | Discharge: 2024-05-23 | Disposition: A | Discharge status: Home / Self Care | Attending: Emergency Medicine | Admitting: Emergency Medicine

## 2024-05-23 ENCOUNTER — Emergency Department (HOSPITAL_COMMUNITY)

## 2024-05-23 VITALS — BP 127/68 | HR 100 | Ht 65.0 in | Wt 216.0 lb

## 2024-05-23 DIAGNOSIS — Z0000 Encounter for general adult medical examination without abnormal findings: Secondary | ICD-10-CM | POA: Diagnosis not present

## 2024-05-23 DIAGNOSIS — R1031 Right lower quadrant pain: Secondary | ICD-10-CM | POA: Diagnosis not present

## 2024-05-23 DIAGNOSIS — R109 Unspecified abdominal pain: Secondary | ICD-10-CM | POA: Diagnosis not present

## 2024-05-23 DIAGNOSIS — N390 Urinary tract infection, site not specified: Secondary | ICD-10-CM | POA: Diagnosis not present

## 2024-05-23 DIAGNOSIS — Z90710 Acquired absence of both cervix and uterus: Secondary | ICD-10-CM | POA: Diagnosis not present

## 2024-05-23 DIAGNOSIS — Z9049 Acquired absence of other specified parts of digestive tract: Secondary | ICD-10-CM | POA: Diagnosis not present

## 2024-05-23 LAB — URINALYSIS, ROUTINE W REFLEX MICROSCOPIC
Bacteria, UA: NONE SEEN
Bilirubin Urine: NEGATIVE
Glucose, UA: NEGATIVE mg/dL
Hgb urine dipstick: NEGATIVE
Ketones, ur: NEGATIVE mg/dL
Nitrite: NEGATIVE
Protein, ur: NEGATIVE mg/dL
Specific Gravity, Urine: 1.02 (ref 1.005–1.030)
pH: 5 (ref 5.0–8.0)

## 2024-05-23 LAB — COMPREHENSIVE METABOLIC PANEL WITH GFR
ALT: 18 U/L (ref 0–44)
AST: 30 U/L (ref 15–41)
Albumin: 4 g/dL (ref 3.5–5.0)
Alkaline Phosphatase: 125 U/L (ref 38–126)
Anion gap: 11 (ref 5–15)
BUN: 24 mg/dL — ABNORMAL HIGH (ref 8–23)
CO2: 22 mmol/L (ref 22–32)
Calcium: 9.4 mg/dL (ref 8.9–10.3)
Chloride: 107 mmol/L (ref 98–111)
Creatinine, Ser: 1.04 mg/dL — ABNORMAL HIGH (ref 0.44–1.00)
GFR, Estimated: 55 mL/min — ABNORMAL LOW (ref >60)
Glucose, Bld: 105 mg/dL — ABNORMAL HIGH (ref 70–99)
Potassium: 4.2 mmol/L (ref 3.5–5.1)
Sodium: 140 mmol/L (ref 135–145)
Total Bilirubin: 0.8 mg/dL (ref 0.0–1.2)
Total Protein: 7.4 g/dL (ref 6.5–8.1)

## 2024-05-23 LAB — LACTIC ACID, PLASMA: Lactic Acid, Venous: 1.3 mmol/L (ref 0.5–1.9)

## 2024-05-23 LAB — CBC WITH DIFFERENTIAL/PLATELET
Abs Immature Granulocytes: 0.02 10*3/uL (ref 0.00–0.07)
Basophils Absolute: 0 10*3/uL (ref 0.0–0.1)
Basophils Relative: 0 %
Eosinophils Absolute: 0.1 10*3/uL (ref 0.0–0.5)
Eosinophils Relative: 1 %
HCT: 36.3 % (ref 36.0–46.0)
Hemoglobin: 11.7 g/dL — ABNORMAL LOW (ref 12.0–15.0)
Immature Granulocytes: 0 %
Lymphocytes Relative: 15 %
Lymphs Abs: 1.4 10*3/uL (ref 0.7–4.0)
MCH: 28.7 pg (ref 26.0–34.0)
MCHC: 32.2 g/dL (ref 30.0–36.0)
MCV: 89 fL (ref 80.0–100.0)
Monocytes Absolute: 0.7 10*3/uL (ref 0.1–1.0)
Monocytes Relative: 7 %
Neutro Abs: 6.8 10*3/uL (ref 1.7–7.7)
Neutrophils Relative %: 77 %
Platelets: 229 10*3/uL (ref 150–400)
RBC: 4.08 MIL/uL (ref 3.87–5.11)
RDW: 12.9 % (ref 11.5–15.5)
WBC: 9 10*3/uL (ref 4.0–10.5)
nRBC: 0 % (ref 0.0–0.2)

## 2024-05-23 MED ORDER — CEPHALEXIN 500 MG PO CAPS: 500.0000 mg | ORAL_CAPSULE | Freq: Four times a day (QID) | ORAL | 0 refills | Status: AC

## 2024-05-23 MED ORDER — SODIUM CHLORIDE 0.9 % IV BOLUS
500.0000 mL | Freq: Once | INTRAVENOUS | Status: AC
  Administered 2024-05-23: 500 mL via INTRAVENOUS

## 2024-05-23 MED ORDER — HYDROCODONE-ACETAMINOPHEN 5-325 MG PO TABS
1.0000 | ORAL_TABLET | Freq: Once | ORAL | Status: AC
  Administered 2024-05-23: 1 via ORAL
  Filled 2024-05-23: qty 1

## 2024-05-23 MED ORDER — PHENAZOPYRIDINE HCL 200 MG PO TABS: 200.0000 mg | ORAL_TABLET | Freq: Three times a day (TID) | ORAL | 0 refills | Status: DC

## 2024-05-23 MED ORDER — ONDANSETRON HCL 4 MG/2ML IJ SOLN
4.0000 mg | Freq: Once | INTRAMUSCULAR | Status: AC
  Administered 2024-05-23: 4 mg via INTRAVENOUS
  Filled 2024-05-23: qty 2

## 2024-05-23 MED ORDER — IOHEXOL 300 MG/ML  SOLN
100.0000 mL | Freq: Once | INTRAMUSCULAR | Status: AC | PRN
  Administered 2024-05-23: 100 mL via INTRAVENOUS

## 2024-05-23 MED ORDER — SODIUM CHLORIDE 0.9 % IV SOLN
1.0000 g | Freq: Once | INTRAVENOUS | Status: AC
  Administered 2024-05-23: 1 g via INTRAVENOUS
  Filled 2024-05-23: qty 10

## 2024-05-23 MED ORDER — MORPHINE SULFATE (PF) 4 MG/ML IV SOLN
2.0000 mg | Freq: Once | INTRAVENOUS | Status: AC
  Administered 2024-05-23: 2 mg via INTRAVENOUS
  Filled 2024-05-23: qty 1

## 2024-05-23 NOTE — ED Provider Notes (Signed)
 [place] EMERGENCY DEPARTMENT AT [facility] Provider Note   CSN: [id number] Arrival date & time: 05/23/24  7829     History  Chief Complaint  Patient presents with   Flank Pain    Andrea Martinez is a 78 y.o. female.  Patient is a 78 year old female who presents emergency department the chief complaint of ongoing right-sided flank pain which has been present for approximate the past month.  She notes that she did see her primary care doctor last week and was placed on antibiotics.  She notes that she was compliant with this medication but notes that the symptoms have not improved.  She does note that she has been experiencing bladder spasms.  She denies any associated vomiting but does admit to nausea.  There is been no associated diarrhea or constipation.  She denies any recent falls or blunt abdominal wall trauma.   Flank Pain       Home Medications Prior to Admission medications   Medication Sig Start Date End Date Taking? Authorizing Provider  levothyroxine  (SYNTHROID ) 75 MCG tablet Take 1 tablet (75 mcg total) by mouth daily before breakfast. 03/02/24  Yes Nida, Gebreselassie W, MD  Acetaminophen  (ACETAMIN PO) Take by mouth as needed.    [provider]  cetirizine (ZYRTEC) 10 MG tablet Take 10 mg by mouth daily.    [provider]  desvenlafaxine  (PRISTIQ ) 25 MG 24 hr tablet TAKE 1 TABLET (25 MG TOTAL) BY MOUTH DAILY. 03/20/24   Galvin Jules, FNP  Multiple Vitamins-Minerals (MULTIVITAMIN WOMEN PO) Take 1 tablet by mouth daily.    [provider]  nilotinib  (TASIGNA ) 150 MG capsule Take 2 capsules (300 mg total) by mouth every 12 (twelve) hours. 09/27/23   Paulett Boros, MD  Probiotic Product (DIGESTIVE ADV PREBIOT+PROBIOT) CHEW Chew 2 each by mouth daily.    [provider]  prochlorperazine  (COMPAZINE ) 10 MG tablet Take 1 tablet (10 mg total) by mouth every 6 (six) hours as needed for nausea or vomiting. 04/19/24    Paulett Boros, MD  rosuvastatin  (CRESTOR ) 5 MG tablet Take 1 tablet (5 mg total) by mouth at bedtime. 03/02/24   Nida, Gebreselassie W, MD      Allergies    Nucynta [tapentadol hcl], Codeine, Sulfonamide derivatives, Nexium [esomeprazole magnesium ], Sulfa antibiotics, and Fluconazole    Review of Systems   Review of Systems  Genitourinary:  Positive for flank pain.  All other systems reviewed and are negative.   Physical Exam Updated Vital Signs BP 135/70 (BP Location: Right Arm)   Pulse 93   Temp (!) 97.5 F (36.4 C) (Temporal)   Resp 18   Ht 5' 5 (1.651 m)   Wt 98 kg   SpO2 100%   BMI 35.94 kg/m  Physical Exam Vitals and nursing note reviewed.  Constitutional:      Appearance: Normal appearance.  HENT:     Head: Normocephalic and atraumatic.     Nose: Nose normal.     Mouth/Throat:     Mouth: Mucous membranes are moist.  Eyes:     Extraocular Movements: Extraocular movements intact.     Conjunctiva/sclera: Conjunctivae normal.     Pupils: Pupils are equal, round, and reactive to light.  Cardiovascular:     Rate and Rhythm: Normal rate and regular rhythm.     Pulses: Normal pulses.     Heart sounds: Normal heart sounds. No murmur heard.    No gallop.  Pulmonary:     Effort:  Pulmonary effort is normal. No respiratory distress.     Breath sounds: Normal breath sounds. No stridor. No wheezing, rhonchi or rales.  Abdominal:     General: Abdomen is flat. Bowel sounds are normal. There is no distension.     Palpations: Abdomen is soft.     Tenderness: There is no guarding.     Comments: Tenderness palpation to right side of abdomen and right flank  Musculoskeletal:        General: Normal range of motion.     Cervical back: Normal range of motion and neck supple. No rigidity or tenderness.     Right lower leg: No edema.     Left lower leg: No edema.  Skin:    General: Skin is warm and dry.     Findings: No bruising or rash.  Neurological:     General: No  focal deficit present.     Mental Status: She is alert and oriented to person, place, and time. Mental status is at baseline.     Cranial Nerves: No cranial nerve deficit.     Sensory: No sensory deficit.     Motor: No weakness.     Coordination: Coordination normal.     Gait: Gait normal.  Psychiatric:        Mood and Affect: Mood normal.        Behavior: Behavior normal.        Thought Content: Thought content normal.        Judgment: Judgment normal.     ED Results / Procedures / Treatments   Labs (all labs ordered are listed, but only abnormal results are displayed) Labs Reviewed  URINALYSIS, ROUTINE W REFLEX MICROSCOPIC - Abnormal; Notable for the following components:      Result Value   APPearance HAZY (*)    Leukocytes,Ua MODERATE (*)    All other components within normal limits  COMPREHENSIVE METABOLIC PANEL WITH GFR  LACTIC ACID, PLASMA  LACTIC ACID, PLASMA  CBC WITH DIFFERENTIAL/PLATELET    EKG None  Radiology No results found.  Procedures Procedures    Medications Ordered in ED Medications  morphine  (PF) 4 MG/ML injection 2 mg (has no administration in time range)  ondansetron  (ZOFRAN ) injection 4 mg (has no administration in time range)  sodium chloride  0.9 % bolus 500 mL (has no administration in time range)  iohexol  (OMNIPAQUE ) 300 MG/ML solution 100 mL (100 mLs Intravenous Contrast Given 05/23/24 1035)    ED Course/ Medical Decision Making/ A&P                                 Medical Decision Making Amount and/or Complexity of Data Reviewed Labs: ordered. Radiology: ordered.  Risk Prescription drug management.   This patient presents to the ED for concern of right flank pain, suprapubic pain differential diagnosis includes pyelonephritis, urinary tract infection, kidney stone, diverticulitis, appendicitis, small bowel obstruction    Additional history obtained:  Additional history obtained from family External records from outside  source obtained and reviewed including medical records   Lab Tests:  I Ordered, and personally interpreted labs.  The pertinent results include: No leukocytosis, no anemia, normal kidney function liver function, normal electrolytes, normal lactic acid, moderate leukocytes on urinalysis   Imaging Studies ordered:  I ordered imaging studies including CT scan of the abdomen and pelvis I independently visualized and interpreted imaging which showed no acute intra-abdominal surgical process I agree  with the radiologist interpretation   Medicines ordered and prescription drug management:  I ordered medication including morphine , Zofran , IV fluids for pain, nausea Reevaluation of the patient after these medicines showed that the patient improved I have reviewed the patients home medicines and have made adjustments as needed   Problem List / ED Course:  Patient is doing well at this time and is stable for discharge home.  Discussed with patient we will change her antibiotics for her urinary tract infection.  She is currently on ciprofloxacin  at this point.  CT scan of the abdomen pelvis demonstrated no signs of acute surgical process.  Blood work has otherwise been unremarkable at this point.  Will recommend continued close follow-up with her nephrologist and urologist on an outpatient basis for continued workup and management.  Will send urine for culture at this time.  Patient has no indication for ureteral stone or appendicitis at this point.  There is no indication for diverticulitis or small bowel obstruction.  Patient case was fully discussed with attending physician who is in agreement to plan at this time.  Strict turn precautions were provided for any new or worsening symptoms.  Patient voiced understanding and had no additional questions.   Social Determinants of Health:  None           Final Clinical Impression(s) / ED Diagnoses Final diagnoses:  None    Rx / DC  Orders ED Discharge Orders     None         Emmalene Hare 05/23/24 1147    Arvilla Birmingham, MD 05/23/24 1645

## 2024-05-23 NOTE — Progress Notes (Signed)
 Subjective:   Andrea Martinez is a 78 y.o. who presents for a Medicare Wellness preventive visit.  As a reminder, Annual Wellness Visits don't include a physical exam, and some assessments may be limited, especially if this visit is performed virtually. We may recommend an in-person follow-up visit with your provider if needed.  Visit Complete: Virtual I connected with  Andrea Martinez on 05/23/24 by a audio enabled telemedicine application and verified that I am speaking with the correct person using two identifiers.  Patient Location: Home  Provider Location: Home Office  I discussed the limitations of evaluation and management by telemedicine. The patient expressed understanding and agreed to proceed.  Vital Signs: Because this visit was a virtual/telehealth visit, some criteria may be missing or patient reported. Any vitals not documented were not able to be obtained and vitals that have been documented are patient reported.  VideoDeclined- This patient declined Librarian, academic. Therefore the visit was completed with audio only.  Persons Participating in Visit: Patient.  AWV Questionnaire: No: Patient Medicare AWV questionnaire was not completed prior to this visit.  Cardiac Risk Factors include: advanced age (>68men, >35 women);dyslipidemia;obesity (BMI >30kg/m2)     Objective:     Today's Vitals   05/23/24 0801 05/23/24 0803  BP: 127/68   Pulse: 100   Weight: 216 lb (98 kg)   Height: 5' 5 (1.651 m)   PainSc:  10-Worst pain ever   Body mass index is 35.94 kg/m.     05/23/2024    8:10 AM 04/19/2024   10:44 AM 11/30/2023   11:52 AM 08/09/2023    1:52 PM 06/29/2023    8:08 AM 06/23/2023   11:09 AM 05/23/2023    8:32 AM  Advanced Directives  Does Patient Have a Medical Advance Directive? Yes No Yes Yes Yes Yes Yes  Type of Estate agent of [place];Living will  Healthcare Power of [place];Living will Healthcare Power of  [place];Living will Living will Living will Healthcare Power of [place];Living will  Does patient want to make changes to medical advance directive?   No - Patient declined No - Patient declined No - Patient declined    Copy of Healthcare Power of Attorney in Chart? No - copy requested  No - copy requested No - copy requested No - copy requested No - copy requested No - copy requested  Would patient like information on creating a medical advance directive?  No - Patient declined         Current Medications (verified) Outpatient Encounter Medications as of 05/23/2024  Medication Sig   Acetaminophen  (ACETAMIN PO) Take by mouth as needed.   cetirizine (ZYRTEC) 10 MG tablet Take 10 mg by mouth daily.   desvenlafaxine  (PRISTIQ ) 25 MG 24 hr tablet TAKE 1 TABLET (25 MG TOTAL) BY MOUTH DAILY.   levothyroxine  (SYNTHROID ) 75 MCG tablet Take 1 tablet (75 mcg total) by mouth daily before breakfast.   Multiple Vitamins-Minerals (MULTIVITAMIN WOMEN PO) Take 1 tablet by mouth daily.   nilotinib  (TASIGNA ) 150 MG capsule Take 2 capsules (300 mg total) by mouth every 12 (twelve) hours.   Probiotic Product (DIGESTIVE ADV PREBIOT+PROBIOT) CHEW Chew 2 each by mouth daily.   prochlorperazine  (COMPAZINE ) 10 MG tablet Take 1 tablet (10 mg total) by mouth every 6 (six) hours as needed for nausea or vomiting.   rosuvastatin  (CRESTOR ) 5 MG tablet Take 1 tablet (5 mg total) by mouth at bedtime.   No facility-administered encounter medications on  file as of 05/23/2024.    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 Christobal Craft   Lipoma     left upper arm   Mixed hyperlipidemia    Osteoporosis    Pericardial effusion    a. HCAP complicated by pericardial effusion requiring pericardial window 10/2016 and large L pleural effusion requring VATS.   Pleural effusion    a. s/p VATS 2017.   Pneumonia 10/2016   PONV (postoperative nausea and vomiting)    history of   Prolonged QT interval    Urine incontinence    UTI (lower urinary tract infection)    frequent   Vitamin D  deficiency    Past Surgical History:  Procedure Laterality Date   BIOPSY  03/20/2022   Procedure: BIOPSY;  Surgeon: Vinetta Greening, DO;  [facility];  Service: Endoscopy;;  gastric lesion vs. gastric ulcer   BIOPSY  06/28/2022   Procedure: BIOPSY;  Surgeon: Vinetta Greening, DO;  [facility];  Service: Endoscopy;;   BIOPSY  05/10/2023   Procedure: BIOPSY;  Surgeon: Vinetta Greening, DO;  [facility];  Service: Endoscopy;;   BRAVO PH STUDY  12/04/2012   Procedure: BRAVO PH STUDY;  Surgeon: Alyce Jubilee, MD;  [facility];  Service: Endoscopy;  Laterality: N/A;   CHOLECYSTECTOMY N/A 01/03/2014   Procedure: LAPAROSCOPIC CHOLECYSTECTOMY WITH INTRAOPERATIVE CHOLANGIOGRAM;  Surgeon: Brandy Cal. Cornett, MD;  Location: [facility];  Service: General;  Laterality: N/A;   COLONOSCOPY  01/2004   DR [facility], few small tics   COLONOSCOPY WITH PROPOFOL  N/A 05/10/2023   Procedure: COLONOSCOPY WITH PROPOFOL ;  Surgeon: Vinetta Greening, DO;  [facility];  Service: Endoscopy;  Laterality: N/A;  1245pm, asa 3   ESOPHAGOGASTRODUODENOSCOPY  02/2010   gastric ulcers   ESOPHAGOGASTRODUODENOSCOPY  12/04/2012   NWG:[id number] gastritis (inflammation) was found in the gastric antrum; multiple biopsies The duodenal mucosa showed no abnormalities in the bulb and second portion of the duodenum   ESOPHAGOGASTRODUODENOSCOPY (EGD) WITH PROPOFOL  N/A 03/20/2022   Procedure: ESOPHAGOGASTRODUODENOSCOPY (EGD) WITH  PROPOFOL ;  Surgeon: Vinetta Greening, DO;  [facility];  Service: Endoscopy;  Laterality: N/A;   ESOPHAGOGASTRODUODENOSCOPY (EGD) WITH PROPOFOL  N/A 06/28/2022   Procedure: ESOPHAGOGASTRODUODENOSCOPY (EGD) WITH PROPOFOL ;  Surgeon: Vinetta Greening, DO;  [facility];  Service: Endoscopy;  Laterality: N/A;  800   ESOPHAGOGASTRODUODENOSCOPY (EGD) WITH PROPOFOL  N/A 05/10/2023   Procedure: ESOPHAGOGASTRODUODENOSCOPY (EGD) WITH PROPOFOL ;  Surgeon: Vinetta Greening, DO;  [facility];  Service: Endoscopy;  Laterality: N/A;   JOINT REPLACEMENT Right 10/22/2019   KYPHOPLASTY N/A 02/14/2019   Procedure: KYPHOPLASTY T12 and L1;  Surgeon: Mort Ards, MD;  [facility]: [facility];  Service: Orthopedics;  Laterality: N/A;  120 mins   LASIK     LIPOMA EXCISION  08/02/2011   left shoulder   NOSE SURGERY     PARTIAL HYSTERECTOMY     vaginal at age 16 years of age   POLYPECTOMY  05/10/2023   Procedure: POLYPECTOMY;  Surgeon: Vinetta Greening, DO;  [facility];  Service: Endoscopy;;   TOE DEBRIDEMENT Left 1962   lt great toe   TOTAL HIP ARTHROPLASTY Right 11/18/2021   Procedure: TOTAL HIP ARTHROPLASTY ANTERIOR APPROACH;  Surgeon: Liliane Rei, MD;  Location: [facility];  Service: Orthopedics;  Laterality: Right;   TOTAL HIP ARTHROPLASTY Left 06/29/2023   Procedure: LEFT TOTAL HIP ARTHROPLASTY ANTERIOR APPROACH;  Surgeon: Liliane Rei, MD;  Location: [facility];  Service: Orthopedics;  Laterality: Left;   TOTAL KNEE ARTHROPLASTY Right 10/22/2019   Procedure: TOTAL KNEE ARTHROPLASTY;  Surgeon: Liliane Rei, MD;  [facility];  Service: Orthopedics;  Laterality: Right;    TOTAL KNEE ARTHROPLASTY Left 03/03/2020   Procedure: TOTAL KNEE ARTHROPLASTY;  Surgeon: Liliane Rei, MD;  Location: [facility];  Service: Orthopedics;  Laterality: Left;    TRANSFORAMINAL LUMBAR INTERBODY FUSION (TLIF) WITH PEDICLE SCREW FIXATION 1 LEVEL N/A 01/28/2021   Procedure:  TRANSFORAMINAL LUMBAR INTERBODY FUSION (TLIF) LUMBAR FIVE-SACRAL ONE;  Surgeon: Mort Ards, MD;  [facility];  Service: Orthopedics;  Laterality: N/A;  4 hrs   VIDEO ASSISTED THORACOSCOPY (VATS)/EMPYEMA Left 10/25/2016   Procedure: VIDEO ASSISTED THORACOSCOPY (VATS), BRONCH,DRAINAGE OF PLEURAL EFFUSION,PERICARDIAL WINDOW WITH DRAINAGE OF PERICARDIAL FLUID, TEE;  Surgeon: Zelphia Higashi, MD;  Location: [facility];  Service: Thoracic;  Laterality: Left;   VIDEO BRONCHOSCOPY N/A 10/25/2016   Procedure: VIDEO BRONCHOSCOPY;  Surgeon: Zelphia Higashi, MD;  [facility];  Service: Thoracic;  Laterality: N/A;   Family History  Problem Relation Age of Onset   Lung cancer Father    COPD Mother    Stroke Mother    Thyroid  disease Mother    Colon cancer Neg Hx    Anesthesia problems Neg Hx    Hypotension Neg Hx    Malignant hyperthermia Neg Hx    Pseudochol deficiency Neg Hx    Social History   Socioeconomic History   Marital status: Married    Spouse name: Caretha Chapel   Number of children: 2   Years of education: 12th grade   Highest education level: Not on file  Occupational History   Occupation: retired    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   2 granddaughters.   Retired HR.    Social Drivers of Corporate investment banker Strain: Low Risk  (05/23/2024)   Overall Financial Resource Strain (CARDIA)    Difficulty of Paying Living Expenses: Not hard at all  Food Insecurity: No Food Insecurity (05/23/2024)   Hunger Vital Sign    Worried About Running Out of Food in  the Last Year: Never true    Ran Out of Food in the Last Year: Never true  Transportation Needs: No Transportation Needs (05/23/2024)   PRAPARE - Administrator, Civil Service (Medical): No    Lack of Transportation (Non-Medical): No  Physical Activity: Sufficiently Active (05/23/2024)   Exercise Vital Sign    Days of Exercise per Week: 7 days    Minutes of  Exercise per Session: 30 min  Stress: Stress Concern Present (05/23/2024)   [employer] of Occupational Health - Occupational Stress Questionnaire    Feeling of Stress : Very much  Social Connections: Moderately Isolated (05/23/2024)   Social Connection and Isolation Panel [NHANES]    Frequency of Communication with Friends and Family: More than three times a week    Frequency of Social Gatherings with Friends and Family: More than three times a week    Attends Religious Services: Never    Database administrator or Organizations: No    Attends Engineer, structural: Never    Marital Status: Married    Tobacco Counseling Counseling given: Yes    Clinical Intake:  Pre-visit preparation completed: Yes  Pain : 0-10 (r-side pain radiating to back) Pain Score: 10-Worst pain ever Pain Type: Acute pain Pain Location: Flank Pain Orientation: Right Pain Descriptors / Indicators: Aching, Spasm Pain Onset: 1 to 4 weeks ago Pain Frequency: Constant Pain Relieving Factors: abx Effect of Pain on Daily Activities: no  Pain Relieving Factors: abx  BMI - recorded: 35.94 Nutritional Status: BMI > 30  Obese Nutritional Risks: None Diabetes: No  Lab Results  Component Value Date   HGBA1C 5.2 06/22/2023   HGBA1C 5.1 10/30/2021   HGBA1C 5.3 04/24/2019     How often do you need to have someone help you when you read instructions, pamphlets, or other written materials from your doctor or pharmacy?: 1 - Never  Interpreter Needed?: No  Comments: During our awv pt stated that she is in a lot of pain and is  planning to go to UC right after our visit. This pain started roughly about a few weeks ago. It's on the r-side towards the back, pt is on abx per pcp. Information entered by :: Alia t/cma   Activities of Daily Living     05/23/2024    8:08 AM 06/29/2023    3:04 PM  In your present state of health, do you have any difficulty performing the following activities:  Hearing? 0   Vision? 0   Difficulty concentrating or making decisions? 0   Walking or climbing stairs? 0   Dressing or bathing? 0   Doing errands, shopping? 0 0  Preparing Food and eating ? N   Using the Toilet? N   In the past six months, have you accidently leaked urine? Y   Do you have problems with loss of bowel control? N   Managing your Medications? N   Managing your Finances? N   Housekeeping or managing your Housekeeping? N     Patient Care Team: Galvin Jules, FNP as PCP - General (Family Medicine) Paulett Boros, MD as Medical Oncologist (Medical Oncology) Baby Bolt, MD as Consulting Physician (Endocrinology) Mort Ards, MD as Consulting Physician (Orthopedic Surgery)  I have updated your Care Teams any recent Medical Services you may have received from other providers in the past year.     Assessment:    This is a routine wellness examination for [facility].  Hearing/Vision screen Hearing Screening - Comments:: Pt denies hearing dif Vision Screening - Comments:: Pt denies vision/pt goes [facility] [place], [place]/need to make an appt soon   Goals Addressed             This Visit's Progress    Patient Stated       Pt would like to spend more time w/husband instead going in/out to the dr's so much than she  has been       Depression Screen     05/23/2024    8:14 AM 04/20/2024   11:24 AM 02/22/2024    9:57 AM 02/02/2024   11:02 AM 01/11/2024    9:10 AM 08/19/2023   11:28 AM 06/22/2023    9:26 AM  PHQ 2/9 Scores  PHQ - 2 Score 0 1 2 0  2 2  PHQ- 9 Score 0 4 3 0  3 5  Exception  Documentation     Patient refusal      Fall Risk     05/23/2024    8:06 AM 04/20/2024   11:24 AM 02/22/2024    9:57 AM 02/02/2024   11:02 AM 06/22/2023    9:27 AM  Fall Risk   Falls in the past year? 0 0 0 0 0  Number falls in past yr: 0 0  0 0  Injury with Fall? 0 0 0 0 0  Risk for fall due to : No Fall Risks No Fall Risks No Fall Risks No Fall Risks   Follow up Falls evaluation completed Falls evaluation completed Falls evaluation completed Falls evaluation completed Falls evaluation completed    MEDICARE RISK AT HOME:  Medicare Risk at Home Any stairs in or around the home?: No If so, are there any without handrails?: No Home free of loose throw rugs in walkways, pet beds, electrical cords, etc?: Yes Adequate lighting in your home to reduce risk of falls?: Yes Life alert?: No Use of a cane, walker or w/c?: No Grab bars in the bathroom?: Yes Shower chair or bench in shower?: Yes Elevated toilet seat or a handicapped toilet?: Yes  TIMED UP AND GO:  Was the test performed?  no  Cognitive Function: 6CIT completed        05/23/2024    8:15 AM 05/23/2023    8:33 AM 05/17/2022    8:29 AM 05/14/2021    8:36 AM  6CIT Screen  What Year? 0 points 0 points 0 points 0 points  What month? 0 points 0 points 0 points 0 points  What time? 0 points 0 points 0 points 0 points  Count back from 20 0 points 0 points 0 points 0 points  Months in reverse 0 points 0 points 0 points 0 points  Repeat phrase 0 points 0 points 0 points 0 points  Total Score 0 points 0 points 0 points 0 points    Immunizations Immunization History  Administered Date(s) Administered   Fluad Quad(high Dose 65+) 09/11/2020, 09/24/2021   Influenza,inj,Quad PF,6+ Mos 09/13/2017   Influenza-Unspecified 09/24/2015, 11/24/2017, 10/04/2023   Moderna Sars-Covid-2 Vaccination 04/02/2020, 04/30/2020, 01/07/2021   Pneumococcal Conjugate-13 10/09/2014   Pneumococcal Polysaccharide-23 10/09/2014    Screening Tests Health  Maintenance  Topic Date Due   COVID-19 Vaccine (4 - 2024-25 season) 08/14/2023   Zoster Vaccines- Shingrix (1 of 2) 05/24/2024 (Originally 01/13/1965)   DTaP/Tdap/Td (1 - Tdap) 01/10/2025 (Originally 01/13/1965)   INFLUENZA VACCINE  07/13/2024   MAMMOGRAM  11/27/2024   Medicare Annual Wellness (AWV)  05/23/2025   Pneumonia Vaccine 78+ Years old  Completed   DEXA SCAN  Completed   Hepatitis C Screening  Completed   HPV VACCINES  Aged Out   Meningococcal B Vaccine  Aged Out   Colonoscopy  Discontinued    Health Maintenance  Health Maintenance Due  Topic Date Due   COVID-19 Vaccine (4 - 2024-25 season) 08/14/2023   Health Maintenance Items Addressed: See Nurse Notes at  the end of this note  Additional Screening:  Vision Screening: Recommended annual ophthalmology exams for early detection of glaucoma and other disorders of the eye. Would you like a referral to an eye doctor? No    Dental Screening: Recommended annual dental exams for proper oral hygiene  Community Resource Referral / Chronic Care Management: CRR required this visit?  No   CCM required this visit?  No   Plan:    I have personally reviewed and noted the following in the patient's chart:   Medical and social history Use of alcohol, tobacco or illicit drugs  Current medications and supplements including opioid prescriptions. Patient is not currently taking opioid prescriptions. Functional ability and status Nutritional status Physical activity Advanced directives List of other physicians Hospitalizations, surgeries, and ER visits in previous 12 months Vitals Screenings to include cognitive, depression, and falls Referrals and appointments  In addition, I have reviewed and discussed with patient certain preventive protocols, quality metrics, and best practice recommendations. A written personalized care plan for preventive services as well as general preventive health recommendations were provided to  patient.   Michaelle Adolphus, CMA   05/23/2024   After Visit Summary: (MyChart) Due to this being a telephonic visit, the after visit summary with patients personalized plan was offered to patient via MyChart   Notes: Nothing significant to report at this time.

## 2024-05-23 NOTE — Patient Instructions (Signed)
 Andrea Martinez , Thank you for taking time out of your busy schedule to complete your Annual Wellness Visit with me. I enjoyed our conversation and look forward to speaking with you again next year. I, as well as your care team,  appreciate your ongoing commitment to your health goals. Please review the following plan we discussed and let me know if I can assist you in the future. Your Game plan/ To Do List   Follow up Visits: Next Medicare AWV with our clinical staff: 05/24/25 at 8:00a.m.   Next Office Visit with your provider: 07/24/24 at 10:35a.m.  Clinician Recommendations:  Aim for 30 minutes of exercise or brisk walking, 6-8 glasses of water , and 5 servings of fruits and vegetables each day. N/a      This is a list of the screening recommended for you and due dates:  Health Maintenance  Topic Date Due   COVID-19 Vaccine (4 - 2024-25 season) 08/14/2023   Zoster (Shingles) Vaccine (1 of 2) 05/24/2024*   DTaP/Tdap/Td vaccine (1 - Tdap) 01/10/2025*   Flu Shot  07/13/2024   Mammogram  11/27/2024   Medicare Annual Wellness Visit  05/23/2025   Pneumonia Vaccine  Completed   DEXA scan (bone density measurement)  Completed   Hepatitis C Screening  Completed   HPV Vaccine  Aged Out   Meningitis B Vaccine  Aged Out   Colon Cancer Screening  Discontinued  *Topic was postponed. The date shown is not the original due date.    Advanced directives: (Copy Requested) Please bring a copy of your health care power of attorney and living will to the office to be added to your chart at your convenience. You can mail to [facility] [address] [place], [place] [address] or email to [email or web address] Advance Care Planning is important because it:  [x]  Makes sure you receive the medical care that is consistent with your values, goals, and preferences  [x]  It provides guidance to your family and loved ones and reduces their decisional burden about whether or not they are making  the right decisions based on your wishes.  Follow the link provided in your after visit summary or read over the paperwork we have mailed to you to help you started getting your Advance Directives in place. If you need assistance in completing these, please reach out to us  so that we can help you!  See attachments for Preventive Care and Fall Prevention Tips.

## 2024-05-23 NOTE — ED Triage Notes (Signed)
 Pt arrived via POV from home c/o right flank pain X 1 month. Pt reports seeing PCP and being prescribed an antibiotic w/o relief. Pt endorses chills and nausea as well.

## 2024-05-23 NOTE — Discharge Instructions (Signed)
 Please follow-up closely with your kidney doctor and urology on an outpatient basis.  Return to emergency department immediately for any new or worsening symptoms.  Please take all antibiotics as directed.

## 2024-05-23 NOTE — ED Notes (Signed)
 Patient transported to CT

## 2024-05-24 ENCOUNTER — Inpatient Hospital Stay: Admitting: Hematology

## 2024-05-28 ENCOUNTER — Inpatient Hospital Stay (HOSPITAL_BASED_OUTPATIENT_CLINIC_OR_DEPARTMENT_OTHER): Admitting: Hematology

## 2024-05-28 VITALS — BP 129/94 | HR 99 | Temp 97.0°F | Resp 21

## 2024-05-28 DIAGNOSIS — C9210 Chronic myeloid leukemia, BCR/ABL-positive, not having achieved remission: Secondary | ICD-10-CM | POA: Diagnosis not present

## 2024-05-28 NOTE — Progress Notes (Signed)
 Andrea [facility] [address] [place] [address]    Clinic Day:  05/28/2024  Referring physician: Galvin Jules, FNP  Patient Care Team: Galvin Jules, FNP as PCP - General (Family Medicine) Paulett Boros, MD as Medical Oncologist (Medical Oncology) Baby Bolt, MD as Consulting Physician (Endocrinology) Mort Ards, MD as Consulting Physician (Orthopedic Surgery)   ASSESSMENT & PLAN:   Assessment: 1.  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 -Left pleural effusion and pericardial effusion requiring pericardial window on 10/25/2016.  Fluid cytology negative. -Started back on Nilotinib  300 mg every 12 hours on 12/06/2016. - She stopped nilotinib  on 03/07/2024   2.  Daytime sweats: -He had a long history of daytime sweats.  She had extensive work-up including a CT scan CAP, which showed no evidence of malignancy.  Hepatic morphology suspicious for cirrhosis.    Plan: 1.  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 - Since stopping neratinib, she developed some sweating, chills, nausea and decreased sleep.  The symptoms are stable.  She tried taking Compazine  for nausea which caused her lightheadedness. - I have reviewed labs from 05/16/2024: Normal LFTs.  LDH normal.  CBC was grossly normal.  BCR/ABL by quantitative PCR is undetectable. - Will continue close monitoring with repeat BCR/ABL by PCR in 6 weeks. - She was told to try half tablet of Compazine  for nausea as needed. - She was recently evaluated in the ER on 05/23/2024 for possible UTI/kidney stones as she was having right-sided abdominal pain.  I have reviewed CT scan which did not show any evidence of pathology contributing to her pain.  She has follow-up with urology and nephrology.  2.  Normocytic anemia: - She received Feraheme  in March.  Hemoglobin is stable at 12.1.  Ferritin is 150 and saturation is 12.  No indication for parenteral iron therapy  at this time.    Orders Placed This Encounter  Procedures   CBC with Differential    Standing Status:   Future    Expected Date:   07/02/2024    Expiration Date:   09/30/2024   Comprehensive metabolic panel    Standing Status:   Future    Expected Date:   07/02/2024    Expiration Date:   09/30/2024   Lactate dehydrogenase    Standing Status:   Future    Expected Date:   07/02/2024    Expiration Date:   09/30/2024   BCR-ABL1, CML/ALL, PCR, QUANT    Standing Status:   Future    Expected Date:   07/02/2024    Expiration Date:   09/30/2024      Hurman Maiden R Teague,acting as a scribe for Paulett Boros, MD.,have documented all relevant documentation on the behalf of Paulett Boros, MD,as directed by  Paulett Boros, MD while in the presence of Paulett Boros, MD.  I, Paulett Boros MD, have reviewed the above documentation for accuracy and completeness, and I agree with the above.     Paulett Boros, MD   6/16/202512:44 PM  CHIEF COMPLAINT:   Diagnosis: CML    Cancer Staging  No matching staging information was found for the patient.    Prior Therapy: Hydrea 1000 mg and Allopurinol 300 mg daily, Tasigna  discontinued on 03/07/2024  Current Therapy: Close surveillance   HISTORY OF PRESENT ILLNESS:   Oncology History  CML (chronic myeloid leukemia) (HCC)  11/21/2015 Initial Diagnosis   Chronic myelogenous leukemia (CML), BCR-ABL1-positive (HCC)  11/21/2015 Miscellaneous   Seen by Dr. [facility]   11/21/2015 -  Chemotherapy   Hydrea 1000 mg and Allopurinol 300 mg daily (due to high uric acid level)   11/21/2015 Tumor Marker   BCR P210 Ratio of Fusion to Control, Blood 0.8941    P210 IS % Ratio, Blood 85.834%     01/02/2016 - 10/23/2016 Chemotherapy   Tasigna    01/08/2016 Miscellaneous   EKG- QT/QTc: 382/440 ms   01/16/2016 Miscellaneous   EKG- QT/QTc: 364/419   03/30/2016 Tumor Marker   b2a2 transcript: 0.317 b3a2 transcript: 0.194    10/23/2016 - 11/05/2016 [facility] Admission   large Left Pleural Effusion and Pericardial Effusion that is now s/p VATS with Drainage, and Drainage of Pericardial Fluid with Window and Biopsy of Pericardium   12/06/2016 -  Chemotherapy   Tasigna  restarted    05/24/2017 Adverse Reaction   Prolonged QTc on EKG; likely d/t chemo. Tasigna  ON HOLD until next visit.    06/10/2017 -  Chemotherapy   Resumed Tasigna    07/13/2017 Miscellaneous   Evaluated by Cardiology for prolonged QTc. Per Dr. Wanetta Guthrie, patient's QT intervals are not prolonged to the point of potentially promoting a ventricular arrhythmia. He thinks she can continue on current medical therapy for her CML with serial ECG monitoring.   12/27/2017 Miscellaneous   EKG: QT/QTc 398/435      INTERVAL HISTORY:   Gelene is a 78 y.o. female presenting to clinic today for follow up of CML. She was last seen by me on 04/19/24.  Since her last visit, she presented to the ED on 05/23/24 for right flank pain and was found to have a lower UTI. Sammie was prescribed Keflex  and pyridium .   Today, she states that she is doing well overall. Her appetite level is at 50%. Her energy level is at 50%.  PAST MEDICAL HISTORY:   Past Medical History: Past Medical History:  Diagnosis Date   Anxiety    denies   Arthritis    Chronic myelogenous leukemia (CML), 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 Christobal Craft   Lipoma    left upper arm   Mixed hyperlipidemia    Osteoporosis    Pericardial effusion    a. HCAP complicated by pericardial effusion requiring pericardial window 10/2016 and large L pleural effusion requring VATS.   Pleural effusion    a. s/p VATS 2017.   Pneumonia 10/2016   PONV (postoperative nausea and vomiting)    history of   Prolonged QT interval    Urine incontinence    UTI (lower urinary tract infection)     frequent   Vitamin D  deficiency     Surgical History: Past Surgical History:  Procedure Laterality Date   BIOPSY  03/20/2022   Procedure: BIOPSY;  Surgeon: Vinetta Greening, DO;  [facility];  Service: Endoscopy;;  gastric lesion vs. gastric ulcer   BIOPSY  06/28/2022   Procedure: BIOPSY;  Surgeon: Vinetta Greening, DO;  [facility];  Service: Endoscopy;;   BIOPSY  05/10/2023   Procedure: BIOPSY;  Surgeon: Vinetta Greening, DO;  [facility];  Service: Endoscopy;;   BRAVO PH STUDY  12/04/2012   Procedure: BRAVO PH STUDY;  Surgeon: Alyce Jubilee, MD;  [facility];  Service: Endoscopy;  Laterality: N/A;   CHOLECYSTECTOMY N/A  01/03/2014   Procedure: LAPAROSCOPIC CHOLECYSTECTOMY WITH INTRAOPERATIVE CHOLANGIOGRAM;  Surgeon: Brandy Cal. Cornett, MD;  Location: [facility];  Service: General;  Laterality: N/A;   COLONOSCOPY  01/2004   [facility], few small tics   COLONOSCOPY WITH PROPOFOL  N/A 05/10/2023   Procedure: COLONOSCOPY WITH PROPOFOL ;  Surgeon: Vinetta Greening, DO;  [facility];  Service: Endoscopy;  Laterality: N/A;  1245pm, asa 3   ESOPHAGOGASTRODUODENOSCOPY  02/2010   gastric ulcers   ESOPHAGOGASTRODUODENOSCOPY  12/04/2012   [id number] gastritis (inflammation) was found in the gastric antrum; multiple biopsies The duodenal mucosa showed no abnormalities in the bulb and second portion of the duodenum   ESOPHAGOGASTRODUODENOSCOPY (EGD) WITH PROPOFOL  N/A 03/20/2022   Procedure: ESOPHAGOGASTRODUODENOSCOPY (EGD) WITH PROPOFOL ;  Surgeon: Vinetta Greening, DO;  [facility];  Service: Endoscopy;  Laterality: N/A;   ESOPHAGOGASTRODUODENOSCOPY (EGD) WITH PROPOFOL  N/A 06/28/2022   Procedure: ESOPHAGOGASTRODUODENOSCOPY (EGD) WITH PROPOFOL ;  Surgeon: Vinetta Greening, DO;  [facility];  Service: Endoscopy;  Laterality: N/A;  800   ESOPHAGOGASTRODUODENOSCOPY (EGD) WITH PROPOFOL  N/A 05/10/2023    Procedure: ESOPHAGOGASTRODUODENOSCOPY (EGD) WITH PROPOFOL ;  Surgeon: Vinetta Greening, DO;  [facility];  Service: Endoscopy;  Laterality: N/A;   JOINT REPLACEMENT Right 10/22/2019   KYPHOPLASTY N/A 02/14/2019   Procedure: KYPHOPLASTY T12 and L1;  Surgeon: Mort Ards, MD;  [facility];  Service: Orthopedics;  Laterality: N/A;  120 mins   LASIK     LIPOMA EXCISION  08/02/2011   left shoulder   NOSE SURGERY     PARTIAL HYSTERECTOMY     vaginal at age 35 years of age   POLYPECTOMY  05/10/2023   Procedure: POLYPECTOMY;  Surgeon: Vinetta Greening, DO;  [facility];  Service: Endoscopy;;   TOE DEBRIDEMENT Left 1962   lt great toe   TOTAL HIP ARTHROPLASTY Right 11/18/2021   Procedure: TOTAL HIP ARTHROPLASTY ANTERIOR APPROACH;  Surgeon: Liliane Rei, MD;  Location: [facility];  Service: Orthopedics;  Laterality: Right;   TOTAL HIP ARTHROPLASTY Left 06/29/2023   Procedure: LEFT TOTAL HIP ARTHROPLASTY ANTERIOR APPROACH;  Surgeon: Liliane Rei, MD;  Location: [facility];  Service: Orthopedics;  Laterality: Left;   TOTAL KNEE ARTHROPLASTY Right 10/22/2019   Procedure: TOTAL KNEE ARTHROPLASTY;  Surgeon: Liliane Rei, MD;  Location: [facility];  Service: Orthopedics;  Laterality: Right;    TOTAL KNEE ARTHROPLASTY Left 03/03/2020   Procedure: TOTAL KNEE ARTHROPLASTY;  Surgeon: Liliane Rei, MD;  Location: [facility];  Service: Orthopedics;  Laterality: Left;    TRANSFORAMINAL LUMBAR INTERBODY FUSION (TLIF) WITH PEDICLE SCREW FIXATION 1 LEVEL N/A 01/28/2021   Procedure: TRANSFORAMINAL LUMBAR INTERBODY FUSION (TLIF) LUMBAR FIVE-SACRAL ONE;  Surgeon: Mort Ards, MD;  [facility];  Service: Orthopedics;  Laterality: N/A;  4 hrs   VIDEO ASSISTED THORACOSCOPY (VATS)/EMPYEMA Left 10/25/2016   Procedure: VIDEO ASSISTED THORACOSCOPY (VATS), BRONCH,DRAINAGE OF PLEURAL EFFUSION,PERICARDIAL WINDOW WITH DRAINAGE OF PERICARDIAL FLUID, TEE;  Surgeon: Zelphia Higashi, MD;   Location: [facility];  Service: Thoracic;  Laterality: Left;   VIDEO BRONCHOSCOPY N/A 10/25/2016   Procedure: VIDEO BRONCHOSCOPY;  Surgeon: Zelphia Higashi, MD;  [facility];  Service: Thoracic;  Laterality: N/A;    Social History: Social History   Socioeconomic History   Marital status: Married    Spouse name: Caretha Chapel   Number of children: 2   Years of education: 12th grade   Highest education level: Not on file  Occupational  History   Occupation: retired    Associate Professor: Scientist, physiological: RETIRED  Tobacco Use   Smoking status: Never    Passive exposure: Never   Smokeless tobacco: Never  Vaping Use   Vaping status: Never Used  Substance and Sexual Activity   Alcohol use: No    Alcohol/week: 0.0 standard drinks of alcohol   Drug use: No   Sexual activity: Yes    Partners: Male    Birth control/protection: None  Other Topics Concern   Not on file  Social History Narrative   1 son, 1 daughter.   2 granddaughters.   Retired HR.    Social Drivers of Corporate investment banker Strain: Low Risk  (05/23/2024)   Overall Financial Resource Strain (CARDIA)    Difficulty of Paying Living Expenses: Not hard at all  Food Insecurity: No Food Insecurity (05/23/2024)   Hunger Vital Sign    Worried About Running Out of Food in the Last Year: Never true    Ran Out of Food in the Last Year: Never true  Transportation Needs: No Transportation Needs (05/23/2024)   PRAPARE - Administrator, Civil Service (Medical): No    Lack of Transportation (Non-Medical): No  Physical Activity: Sufficiently Active (05/23/2024)   Exercise Vital Sign    Days of Exercise per Week: 7 days    Minutes of Exercise per Session: 30 min  Stress: Stress Concern Present (05/23/2024)   [employer] of Occupational Health - Occupational Stress Questionnaire    Feeling of Stress : Very much  Social Connections: Moderately Isolated (05/23/2024)   Social Connection and Isolation Panel     Frequency of Communication with Friends and Family: More than three times a week    Frequency of Social Gatherings with Friends and Family: More than three times a week    Attends Religious Services: Never    Database administrator or Organizations: No    Attends Banker Meetings: Never    Marital Status: Married  Catering manager Violence: Not At Risk (05/23/2024)   Humiliation, Afraid, Rape, and Kick questionnaire    Fear of Current or Ex-Partner: No    Emotionally Abused: No    Physically Abused: No    Sexually Abused: No    Family History: Family History  Problem Relation Age of Onset   Lung cancer Father    COPD Mother    Stroke Mother    Thyroid  disease Mother    Colon cancer Neg Hx    Anesthesia problems Neg Hx    Hypotension Neg Hx    Malignant hyperthermia Neg Hx    Pseudochol deficiency Neg Hx     Current Medications:  Current Outpatient Medications:    Acetaminophen  (ACETAMIN PO), Take by mouth as needed., Disp: , Rfl:    cephALEXin  (KEFLEX ) 500 MG capsule, Take 1 capsule (500 mg total) by mouth 4 (four) times daily for 7 days., Disp: 28 capsule, Rfl: 0   cetirizine (ZYRTEC) 10 MG tablet, Take 10 mg by mouth daily., Disp: , Rfl:    cholecalciferol  (VITAMIN D3) 25 MCG (1000 UNIT) tablet, Take 4,000 Units by mouth daily., Disp: , Rfl:    desvenlafaxine  (PRISTIQ ) 25 MG 24 hr tablet, TAKE 1 TABLET (25 MG TOTAL) BY MOUTH DAILY., Disp: 90 tablet, Rfl: 0   lactose free nutrition (BOOST) LIQD, Take 237 mLs by mouth 3 (three) times daily between meals., Disp: , Rfl:    levothyroxine  (SYNTHROID ) 75 MCG  tablet, Take 1 tablet (75 mcg total) by mouth daily before breakfast., Disp: 90 tablet, Rfl: 1   Multiple Vitamins-Minerals (MULTIVITAMIN WOMEN PO), Take 1 tablet by mouth daily., Disp: , Rfl:    Probiotic Product (DIGESTIVE ADV PREBIOT+PROBIOT) CHEW, Chew 2 each by mouth daily., Disp: , Rfl:    rosuvastatin  (CRESTOR ) 5 MG tablet, Take 1 tablet (5 mg total) by mouth  at bedtime., Disp: 90 tablet, Rfl: 1   nilotinib  (TASIGNA ) 150 MG capsule, Take 2 capsules (300 mg total) by mouth every 12 (twelve) hours. (Patient not taking: Reported on 05/28/2024), Disp: 120 capsule, Rfl: 2   Allergies: Allergies  Allergen Reactions   Nucynta [Tapentadol Hcl] Swelling   Codeine Nausea Only and Other (See Comments)    Headache   Sulfonamide Derivatives Nausea Only and Other (See Comments)    Headache   Nexium [Esomeprazole Magnesium ] Nausea Only   Sulfa Antibiotics Nausea Only    Headache    Fluconazole Rash    Redness and blistering on left thigh    REVIEW OF SYSTEMS:   Review of Systems  Constitutional:  Positive for fatigue. Negative for chills and fever.  HENT:   Negative for lump/mass, mouth sores, nosebleeds, sore throat and trouble swallowing.   Eyes:  Negative for eye problems.  Respiratory:  Positive for shortness of breath. Negative for cough.   Cardiovascular:  Negative for chest pain, leg swelling and palpitations.  Gastrointestinal:  Positive for abdominal pain and nausea. Negative for constipation, diarrhea and vomiting.  Genitourinary:  Negative for bladder incontinence, difficulty urinating, dysuria, frequency, hematuria and nocturia.   Musculoskeletal:  Negative for arthralgias, back pain, flank pain, myalgias and neck pain.  Skin:  Negative for itching and rash.  Neurological:  Positive for dizziness. Negative for headaches and numbness.  Hematological:  Does not bruise/bleed easily.  Psychiatric/Behavioral:  Positive for sleep disturbance. Negative for depression and suicidal ideas. The patient is not nervous/anxious.   All other systems reviewed and are negative.    VITALS:   Blood pressure (!) 129/94, pulse 99, temperature (!) 97 F (36.1 C), temperature source Tympanic, resp. rate (!) 21, SpO2 94%.  Wt Readings from Last 3 Encounters:  05/23/24 216 lb (98 kg)  05/23/24 216 lb (98 kg)  04/20/24 216 lb 6.4 oz (98.2 kg)    There is  no height or weight on file to calculate BMI.  Performance status (ECOG): 1 - Symptomatic but completely ambulatory  PHYSICAL EXAM:   Physical Exam Vitals and nursing note reviewed. Exam conducted with a chaperone present.  Constitutional:      Appearance: Normal appearance.   Cardiovascular:     Rate and Rhythm: Normal rate and regular rhythm.     Pulses: Normal pulses.     Heart sounds: Normal heart sounds.  Pulmonary:     Effort: Pulmonary effort is normal.     Breath sounds: Normal breath sounds.  Abdominal:     Palpations: Abdomen is soft. There is no hepatomegaly, splenomegaly or mass.     Tenderness: There is no abdominal tenderness.   Musculoskeletal:     Right lower leg: No edema.     Left lower leg: No edema.  Lymphadenopathy:     Cervical: No cervical adenopathy.     Right cervical: No superficial, deep or posterior cervical adenopathy.    Left cervical: No superficial, deep or posterior cervical adenopathy.     Upper Body:     Right upper body: No supraclavicular or axillary adenopathy.  Left upper body: No supraclavicular or axillary adenopathy.   Neurological:     General: No focal deficit present.     Mental Status: She is alert and oriented to person, place, and time.   Psychiatric:        Mood and Affect: Mood normal.        Behavior: Behavior normal.     LABS:      Latest Ref Rng & Units 05/23/2024   10:19 AM 05/16/2024   10:58 AM 04/05/2024   10:58 AM  CBC  WBC 4.0 - 10.5 K/uL 9.0  10.1  11.4   Hemoglobin 12.0 - 15.0 g/dL 86.5  78.4  69.6   Hematocrit 36.0 - 46.0 % 36.3  38.1  40.0   Platelets 150 - 400 K/uL 229  252  304       Latest Ref Rng & Units 05/23/2024   10:19 AM 05/16/2024   10:58 AM 02/22/2024   10:17 AM  CMP  Glucose 70 - 99 mg/dL 295  87  94   BUN 8 - 23 mg/dL 24  27  26    Creatinine 0.44 - 1.00 mg/dL 2.84  1.32  4.40   Sodium 135 - 145 mmol/L 140  137  141   Potassium 3.5 - 5.1 mmol/L 4.2  4.6  5.1   Chloride 98 - 111  mmol/L 107  104  103   CO2 22 - 32 mmol/L 22  23  21    Calcium  8.9 - 10.3 mg/dL 9.4  9.6  9.7   Total Protein 6.5 - 8.1 g/dL 7.4  7.8  7.5   Total Bilirubin 0.0 - 1.2 mg/dL 0.8  0.4  0.5   Alkaline Phos 38 - 126 U/L 125  130  175   AST 15 - 41 U/L 30  32  30   ALT 0 - 44 U/L 18  19  20       No results found for: CEA1, CEA / No results found for: CEA1, CEA No results found for: PSA1 No results found for: NUU725 No results found for: CAN125  No results found for: [place] Aliment, MSPIKE, SPEI Lab Results  Component Value Date   TIBC 339 05/16/2024   TIBC 257 02/22/2024   TIBC 311 11/21/2023   FERRITIN 150 05/16/2024   FERRITIN 390 (H) 02/22/2024   FERRITIN 197 11/21/2023   IRONPCTSAT 12 05/16/2024   IRONPCTSAT 15 02/22/2024   IRONPCTSAT 11 11/21/2023   Lab Results  Component Value Date   LDH 128 05/16/2024   LDH 147 11/21/2023   LDH 164 07/26/2023     STUDIES:   US  RENAL Result Date: 05/24/2024 CLINICAL DATA:  CKD EXAM: RENAL / URINARY TRACT ULTRASOUND COMPLETE COMPARISON:  May 17, 2024 FINDINGS: Right Kidney: Renal measurements: 10.9 x 3.3 x 4.8 cm = volume: 91 mL. Echogenicity within normal limits. No mass or hydronephrosis visualized. Left Kidney: Renal measurements: 11.7 x 4.1 x 4.5 cm = volume: 112.9 mL. Echogenicity within normal limits. No mass or hydronephrosis visualized. Bladder: Prevoid: 62.3 cc, postvoid: 52.9 cc Other: None. IMPRESSION: Normal renal ultrasound. Electronically Signed   By: Fredrich Jefferson M.D.   On: 05/24/2024 10:57   CT ABDOMEN PELVIS W CONTRAST Result Date: 05/23/2024 CLINICAL DATA:  Right-sided abdominal pain. EXAM: CT ABDOMEN AND PELVIS WITH CONTRAST TECHNIQUE: Multidetector CT imaging of the abdomen and pelvis was performed using the standard protocol following bolus administration of intravenous contrast. RADIATION DOSE REDUCTION: This exam  was performed according to the  departmental dose-optimization program which includes automated exposure control, adjustment of the mA and/or kV according to patient size and/or use of iterative reconstruction technique. CONTRAST:  OMNIPAQUE  IOHEXOL  300 MG/ML  SOLN COMPARISON:  December 01, 2023. FINDINGS: Lower chest: No acute abnormality. Hepatobiliary: No focal liver abnormality is seen. Status post cholecystectomy. No biliary dilatation. Pancreas: Unremarkable. No pancreatic ductal dilatation or surrounding inflammatory changes. Spleen: Normal in size without focal abnormality. Adrenals/Urinary Tract: Adrenal glands are unremarkable. Kidneys are normal, without renal calculi, focal lesion, or hydronephrosis. Bladder is unremarkable. Stomach/Bowel: The stomach is unremarkable. There is no evidence of bowel obstruction or inflammation. The appendix appears normal. Sigmoid diverticulosis without inflammation. Vascular/Lymphatic: No significant vascular findings are present. No enlarged abdominal or pelvic lymph nodes. Reproductive: Status post hysterectomy. No adnexal masses. Other: Small fat containing periumbilical hernia.  No ascites. Musculoskeletal: Status post bilateral total hip arthroplasties. Status post surgical posterior fusion of L5-S1. Status post kyphoplasty of T12 and L1 vertebral bodies. No acute osseous abnormality is noted. IMPRESSION: No acute abnormality seen in the abdomen or pelvis. Chronic findings as noted above. Electronically Signed   By: Rosalene Colon M.D.   On: 05/23/2024 11:27

## 2024-05-28 NOTE — Patient Instructions (Addendum)
 [facility] Discharge Instructions   You were seen and examined today by Dr. Cheree Cords.  He reviewed the results of your lab work which are normal/stable.   We will see you back in 6 weeks. We will repeat lab work prior to this visit.    Return as scheduled.    Thank you for choosing  [facility] Center at [facility] to provide your oncology and hematology care.  To afford each patient quality time with our provider, please arrive at least 15 minutes before your scheduled appointment time.   If you have a lab appointment with the Cancer Center please come in thru the Main Entrance and check in at the main information desk.  You need to re-schedule your appointment should you arrive 10 or more minutes late.  We strive to give you quality time with our providers, and arriving late affects you and other patients whose appointments are after yours.  Also, if you no show three or more times for appointments you may be dismissed from the clinic at the providers discretion.     Again, thank you for choosing [facility].  Our hope is that these requests will decrease the amount of time that you wait before being seen by our physicians.       _____________________________________________________________  Should you have questions after your visit to [facility], please contact our office at [phone] and follow the prompts.  Our office hours are 8:00 a.m. and 4:30 p.m. Monday - Friday.  Please note that voicemails left after 4:00 p.m. may not be returned until the following business day.  We are closed weekends and major holidays.  You do have access to a nurse 24-7, just call the main number to the clinic [phone] and do not press any options, hold on the line and a nurse will answer the phone.    For prescription refill requests, have your pharmacy contact our office and allow 72 hours.    Due to Covid, you will  need to wear a mask upon entering the hospital. If you do not have a mask, a mask will be given to you at the Main Entrance upon arrival. For doctor visits, patients may have 1 support person age 25 or older with them. For treatment visits, patients can not have anyone with them due to social distancing guidelines and our immunocompromised population.

## 2024-05-30 ENCOUNTER — Inpatient Hospital Stay: Payer: PPO | Admitting: Hematology

## 2024-05-30 NOTE — Progress Notes (Signed)
 Name: ANGENI Martinez DOB: [birth year] MRN: [id number]  History of Present Illness: [name] is a 78 y.o. female who presents today as a new patient at [facility] [place]. All available relevant medical records have been reviewed.  Relevant History includes: Previously seen at [facility] for urinary frequency and cystocele. Had urodynamic testing on 07/06/2021.  She reports chief complaint of ***  Recent history:  > 05/17/2024: RUS normal - no stones, masses, or hydronephrosis.   > 05/22/2024: Seen by Nephrology (Dr. Carrolyn Clan). Reported dysuria, back pain, difficulty voiding, nausea. Per note, a recent urine culture was positive for Klebsiella pneumoniae and treat with Cipro .   > 05/23/2024:  - Seen in ER for UTI with right flank pain and bladder spasms.  - UA: 6-10 WBC/hpf; otherwise unremarkable. - Urine culture: Negative.  - CBC & CMP unremarkable. - CT abdomen/pelvis w/ contrast showed no GU stones, masses, or hydronephrosis; bladder unremarkable.  ***is she here for OAB / UUI or UTI concerns??  Today: She reports ***  She {Actions; denies-reports:120008} urinary urgency, frequency, nocturia x***, dysuria, gross hematuria, ***weak urinary stream, hesitancy, straining to void, or sensations of incomplete emptying.  She {Actions; denies-reports:120008} flank pain or abdominal pain. She {Actions; denies-reports:120008} fevers, nausea, or vomiting.  Medications: Current Outpatient Medications  Medication Sig Dispense Refill   Acetaminophen  (ACETAMIN PO) Take by mouth as needed.     cephALEXin  (KEFLEX ) 500 MG capsule Take 1 capsule (500 mg total) by mouth 4 (four) times daily for 7 days. 28 capsule 0   cetirizine (ZYRTEC) 10 MG tablet Take 10 mg by mouth daily.     cholecalciferol  (VITAMIN D3) 25 MCG (1000 UNIT) tablet Take 4,000 Units by mouth daily.     desvenlafaxine  (PRISTIQ ) 25 MG 24 hr tablet TAKE 1 TABLET (25 MG TOTAL) BY MOUTH DAILY. 90 tablet 0   lactose  free nutrition (BOOST) LIQD Take 237 mLs by mouth 3 (three) times daily between meals.     levothyroxine  (SYNTHROID ) 75 MCG tablet Take 1 tablet (75 mcg total) by mouth daily before breakfast. 90 tablet 1   Multiple Vitamins-Minerals (MULTIVITAMIN WOMEN PO) Take 1 tablet by mouth daily.     nilotinib  (TASIGNA ) 150 MG capsule Take 2 capsules (300 mg total) by mouth every 12 (twelve) hours. (Patient not taking: Reported on 05/28/2024) 120 capsule 2   Probiotic Product (DIGESTIVE ADV PREBIOT+PROBIOT) CHEW Chew 2 each by mouth daily.     rosuvastatin  (CRESTOR ) 5 MG tablet Take 1 tablet (5 mg total) by mouth at bedtime. 90 tablet 1   No current facility-administered medications for this visit.    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 Christobal Craft   Lipoma    left upper arm   Mixed hyperlipidemia    Osteoporosis    Pericardial  effusion    a. HCAP complicated by pericardial effusion requiring pericardial window 10/2016 and large L pleural effusion requring VATS.   Pleural effusion    a. s/p VATS 2017.   Pneumonia 10/2016   PONV (postoperative nausea and vomiting)    history of   Prolonged QT interval    Urine incontinence    UTI (lower urinary tract infection)    frequent   Vitamin D  deficiency    Past Surgical History:  Procedure Laterality Date   BIOPSY  03/20/2022   Procedure: BIOPSY;  Surgeon:  Vinetta Greening, DO;  [facility];  Service: Endoscopy;;  gastric lesion vs. gastric ulcer   BIOPSY  06/28/2022   Procedure: BIOPSY;  Surgeon: Vinetta Greening, DO;  [facility];  Service: Endoscopy;;   BIOPSY  05/10/2023   Procedure: BIOPSY;  Surgeon: Vinetta Greening, DO;  [facility];  Service: Endoscopy;;   BRAVO PH STUDY  12/04/2012   Procedure: BRAVO PH STUDY;  Surgeon: Alyce Jubilee, MD;  [facility];  Service: Endoscopy;  Laterality: N/A;   CHOLECYSTECTOMY N/A 01/03/2014   Procedure: LAPAROSCOPIC CHOLECYSTECTOMY WITH INTRAOPERATIVE CHOLANGIOGRAM;  Surgeon: Brandy Cal. Cornett, MD;  Location: [facility];  Service: General;  Laterality: N/A;   COLONOSCOPY  01/2004   DR [facility], few small tics   COLONOSCOPY WITH PROPOFOL  N/A 05/10/2023   Procedure: COLONOSCOPY WITH PROPOFOL ;  Surgeon: Vinetta Greening, DO;  [facility];  Service: Endoscopy;  Laterality: N/A;  1245pm, asa 3   ESOPHAGOGASTRODUODENOSCOPY  02/2010   gastric ulcers   ESOPHAGOGASTRODUODENOSCOPY  12/04/2012   YNW:GNFAOZH gastritis (inflammation) was found in the gastric antrum; multiple biopsies The duodenal mucosa showed no abnormalities in the bulb and second portion of the duodenum   ESOPHAGOGASTRODUODENOSCOPY (EGD) WITH PROPOFOL  N/A 03/20/2022   Procedure: ESOPHAGOGASTRODUODENOSCOPY (EGD) WITH PROPOFOL ;  Surgeon: Vinetta Greening, DO;  [facility];  Service: Endoscopy;  Laterality: N/A;   ESOPHAGOGASTRODUODENOSCOPY (EGD) WITH PROPOFOL  N/A 06/28/2022   Procedure: ESOPHAGOGASTRODUODENOSCOPY (EGD) WITH PROPOFOL ;  Surgeon: Vinetta Greening, DO;  [facility];  Service: Endoscopy;  Laterality: N/A;  800   ESOPHAGOGASTRODUODENOSCOPY (EGD) WITH PROPOFOL  N/A 05/10/2023   Procedure: ESOPHAGOGASTRODUODENOSCOPY (EGD) WITH PROPOFOL ;  Surgeon: Vinetta Greening, DO;  [facility];  Service: Endoscopy;  Laterality: N/A;    JOINT REPLACEMENT Right 10/22/2019   KYPHOPLASTY N/A 02/14/2019   Procedure: KYPHOPLASTY T12 and L1;  Surgeon: Mort Ards, MD;  Location: [facility];  Service: Orthopedics;  Laterality: N/A;  120 mins   LASIK     LIPOMA EXCISION  08/02/2011   left shoulder   NOSE SURGERY     PARTIAL HYSTERECTOMY     vaginal at age 15 years of age   POLYPECTOMY  05/10/2023   Procedure: POLYPECTOMY;  Surgeon: Vinetta Greening, DO;  [facility];  Service: Endoscopy;;   TOE DEBRIDEMENT Left 1962   lt great toe   TOTAL HIP ARTHROPLASTY Right 11/18/2021   Procedure: TOTAL HIP ARTHROPLASTY ANTERIOR APPROACH;  Surgeon: Liliane Rei, MD;  Location: [facility];  Service: Orthopedics;  Laterality: Right;   TOTAL HIP ARTHROPLASTY Left 06/29/2023   Procedure: LEFT TOTAL HIP ARTHROPLASTY ANTERIOR APPROACH;  Surgeon: Liliane Rei, MD;  Location: [facility];  Service: Orthopedics;  Laterality: Left;   TOTAL KNEE ARTHROPLASTY Right 10/22/2019   Procedure: TOTAL KNEE ARTHROPLASTY;  Surgeon: Liliane Rei, MD;  Location: [facility];  Service:  Orthopedics;  Laterality: Right;    TOTAL KNEE ARTHROPLASTY Left 03/03/2020   Procedure: TOTAL KNEE ARTHROPLASTY;  Surgeon: Liliane Rei, MD;  Location: [facility];  Service: Orthopedics;  Laterality: Left;    TRANSFORAMINAL LUMBAR INTERBODY FUSION (TLIF) WITH PEDICLE SCREW FIXATION 1 LEVEL N/A 01/28/2021   Procedure: TRANSFORAMINAL LUMBAR INTERBODY FUSION (TLIF) LUMBAR FIVE-SACRAL ONE;  Surgeon: Mort Ards, MD;  Location[facility];  Service: Orthopedics;  Laterality: N/A;  4 hrs   VIDEO ASSISTED THORACOSCOPY (VATS)/EMPYEMA Left 10/25/2016   Procedure: VIDEO ASSISTED THORACOSCOPY (VATS), BRONCH,DRAINAGE OF PLEURAL EFFUSION,PERICARDIAL WINDOW WITH DRAINAGE OF PERICARDIAL FLUID, TEE;  Surgeon: Zelphia Higashi, MD;  Location: [facility];  Service: Thoracic;  Laterality: Left;   VIDEO BRONCHOSCOPY N/A 10/25/2016   Procedure: VIDEO BRONCHOSCOPY;  Surgeon: Zelphia Higashi,  MD;  Location: [facility];  Service: Thoracic;  Laterality: N/A;   Family History  Problem Relation Age of Onset   Lung cancer Father    COPD Mother    Stroke Mother    Thyroid  disease Mother    Colon cancer Neg Hx    Anesthesia problems Neg Hx    Hypotension Neg Hx    Malignant hyperthermia Neg Hx    Pseudochol deficiency Neg Hx    Social History   Socioeconomic History   Marital status: Married    Spouse name: Caretha Chapel   Number of children: 2   Years of education: 12th grade   Highest education level: Not on file  Occupational History   Occupation: retired    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   2 granddaughters.   Retired HR.    Social Drivers of Corporate investment banker Strain: Low Risk  (05/23/2024)   Overall Financial Resource Strain (CARDIA)    Difficulty of Paying Living Expenses: Not hard at all  Food Insecurity: No Food Insecurity (05/23/2024)   Hunger Vital Sign    Worried About Running Out of Food in the Last Year: Never true    Ran Out of Food in the Last Year: Never true  Transportation Needs: No Transportation Needs (05/23/2024)   PRAPARE - Administrator, Civil Service (Medical): No    Lack of Transportation (Non-Medical): No  Physical Activity: Sufficiently Active (05/23/2024)   Exercise Vital Sign    Days of Exercise per Week: 7 days    Minutes of Exercise per Session: 30 min  Stress: Stress Concern Present (05/23/2024)   [employer] of Occupational Health - Occupational Stress Questionnaire    Feeling of Stress : Very much   Social Connections: Moderately Isolated (05/23/2024)   Social Connection and Isolation Panel    Frequency of Communication with Friends and Family: More than three times a week    Frequency of Social Gatherings with Friends and Family: More than three times a week    Attends Religious Services: Never    Database administrator or Organizations: No    Attends Banker Meetings:  Never    Marital Status: Married  Catering manager Violence: Not At Risk (05/23/2024)   Humiliation, Afraid, Rape, and Kick questionnaire    Fear of Current or Ex-Partner: No    Emotionally Abused: No    Physically Abused: No    Sexually Abused: No    SUBJECTIVE  Review of Systems Constitutional: Patient denies any unintentional weight loss or change in strength lntegumentary: Patient denies any rashes or pruritus Cardiovascular: Patient denies chest pain or syncope Respiratory: Patient denies shortness of breath Gastrointestinal: ***Patient denies nausea, vomiting, constipation, or diarrhea ***As per HPI Musculoskeletal: Patient denies muscle cramps or weakness Neurologic: Patient denies convulsions or seizures Allergic/Immunologic: Patient denies recent allergic reaction(s) Hematologic/Lymphatic: Patient denies bleeding tendencies Endocrine: Patient denies heat/cold intolerance  GU: As per HPI.  OBJECTIVE There were no vitals filed for this visit. There is no height or weight on file to calculate BMI.  Physical Examination Constitutional: No obvious distress; patient is non-toxic appearing  Cardiovascular: No visible lower extremity edema.  Respiratory: The patient does not have audible wheezing/stridor; respirations do not appear labored  Gastrointestinal: Abdomen non-distended Musculoskeletal: Normal ROM of UEs  Skin: No obvious rashes/open sores  Neurologic: CN 2-12 grossly intact Psychiatric: Answered questions appropriately with normal affect  Hematologic/Lymphatic/Immunologic: No  obvious bruises or sites of spontaneous bleeding  UA: ***negative ***positive for *** leukocytes, *** blood, ***nitrites Urine microscopy: *** WBC/hpf, *** RBC/hpf, *** bacteria ***glucosuria (secondary to ***Jardiance ***Farxiga use) ***otherwise unremarkable  PVR: *** ml  ASSESSMENT No diagnosis found. ***  We agreed to plan for follow up in *** months or sooner if needed. Patient verbalized understanding of and agreement with current plan. All questions were answered.  PLAN Advised the following: *** ***No follow-ups on file.  No orders of the defined types were placed in this encounter.   It has been explained that the patient is to follow regularly with their PCP in addition to all other providers involved in their care and to follow instructions provided by these respective offices. Patient advised to contact urology clinic if any urologic-pertaining questions, concerns, new symptoms or problems arise in the interim period.  There are no Patient Instructions on file for this visit.  Electronically signed by:  Lauretta Ponto, MSN, FNP-C, CUNP 05/30/2024 9:37 AM

## 2024-05-31 ENCOUNTER — Encounter: Payer: Self-pay | Admitting: Urology

## 2024-05-31 ENCOUNTER — Ambulatory Visit: Admitting: Urology

## 2024-05-31 VITALS — BP 110/74 | HR 97

## 2024-05-31 DIAGNOSIS — Z87440 Personal history of urinary (tract) infections: Secondary | ICD-10-CM

## 2024-05-31 DIAGNOSIS — N3289 Other specified disorders of bladder: Secondary | ICD-10-CM | POA: Diagnosis not present

## 2024-05-31 DIAGNOSIS — R3989 Other symptoms and signs involving the genitourinary system: Secondary | ICD-10-CM

## 2024-05-31 DIAGNOSIS — N3281 Overactive bladder: Secondary | ICD-10-CM | POA: Diagnosis not present

## 2024-05-31 DIAGNOSIS — R350 Frequency of micturition: Secondary | ICD-10-CM

## 2024-05-31 DIAGNOSIS — N952 Postmenopausal atrophic vaginitis: Secondary | ICD-10-CM

## 2024-05-31 DIAGNOSIS — N8111 Cystocele, midline: Secondary | ICD-10-CM

## 2024-05-31 LAB — URINALYSIS, ROUTINE W REFLEX MICROSCOPIC
Bilirubin, UA: NEGATIVE
Glucose, UA: NEGATIVE
Ketones, UA: NEGATIVE
Nitrite, UA: NEGATIVE
Protein,UA: NEGATIVE
RBC, UA: NEGATIVE
Specific Gravity, UA: 1.02 (ref 1.005–1.030)
Urobilinogen, Ur: 0.2 mg/dL (ref 0.2–1.0)
pH, UA: 5.5 (ref 5.0–7.5)

## 2024-05-31 LAB — MICROSCOPIC EXAMINATION: Epithelial Cells (non renal): >10 /HPF — AB (ref 0–10)

## 2024-05-31 LAB — BLADDER SCAN AMB NON-IMAGING: Scan Result: 0

## 2024-05-31 MED ORDER — ESTRADIOL 0.1 MG/GM VA CREA: TOPICAL_CREAM | VAGINAL | 3 refills | Status: DC

## 2024-05-31 MED ORDER — GEMTESA 75 MG PO TABS: 1.0000 | ORAL_TABLET | Freq: Every day | ORAL | Status: DC

## 2024-05-31 NOTE — Patient Instructions (Signed)
 UTI prevention / management:  UTI symptoms may include:  - Pain / burning / discomfort when urinating - Recent increase in urinary urgency (how quickly you feel like you need to rush to the bathroom) - Recent increase in urinary frequency (how often you are urinating) - Fever - Acute mental status change / confusion - Fatigue / Feeling tired - Weakness - Note: Urine color, clarity, and odor are not considered to be clinically significant indicators of UTI and do not warrant urine testing unless patient is also experiencing UTI symptoms such as those listed above.  Difference between Urinalysis (urine dipstick test) and Urine culture / Why urine culture often needed to determine appropriate diagnosis and treatment of urologic symptoms: > Urinalysis (urine dipstick test): A quick office test used as an indicator to determine whether or not further testing is necessary (such as a urine culture, urine microscopy, etc.) The urinalysis cannot differentiate a true bacterial UTI or give a definitive diagnosis for the findings.  > Urine culture: May be performed based on the findings of a urinalysis to evaluate for UTI. Grows out on a petri dish for 48-72 hours. Provides important information about: whether or not bacterial growth is present and if so: what the predominant bacteria is which antibiotics will work best against that bacteria That information is important so that we can diagnose and treat patients appropriately as there are other conditions which may mimic UTls which must not be missed (such as cancer, interstitial cystitis, stones, etc.). Assists us  with antibiotic stewardship to minimize patient's risk for developing antibiotic resistance (getting to a point where no antibiotics work anymore).  Options when UTI symptoms occur: 1. Call [facility] [place] and request to speak with triage nurse (phone # [phone], select option 3). In accordance with clinic guidelines  the nurse will determine next steps based on patient-reported symptoms, which may include: same-day lab visit to provide urine specimen, recommendation to schedule Urology office visit appointment for further evaluation, recommendation to proceed to ER, etc. 2. Call your Primary Care Provider (PCP) office to request urgent / same-day visit. Be sure to request for urine culture to be ordered and have results faxed to Urology (fax # [phone]).  3. Go to urgent care. Be sure to request for urine culture to be ordered and have results faxed to Urology (fax # [phone]).   For bladder pain/ burning with urination: - Can take over-the-counter Pyridium  (phenazopyridine ; commonly known under the AZO brand) for a few days as needed. Limit use to no more than 3 days consecutively due to risk for methemoglobinemia, liver function issues, and bone health damage with long term use of Pyridium . - Alternative: Prescription urinary analgesics (such as Uribel, Urogesic blue, Urelle, Uro-MP). Often expensive / poorly covered by insurance unfortunately.  Options / recommendations for UTI prevention: - Topical vaginal estrogen for vaginal atrophy (aka Genitourinary Syndrome of Menopause (GSM)). - Adequate daily fluid intake to flush out the urinary tract. - Go to the bathroom to urinate every 4-6 hours while awake to minimize urinary stasis / bacterial overgrowth in the bladder. - Proanthocyanidin (PAC) supplement 36 mg daily; must be soluble (insoluble form of PAC will be ineffective). Recommended brand: Ellura. This is an over-the-counter supplement (often must be found/ purchased online) supplement derived from cranberries with concentrated active component: Proanthocyanidin (PAC) 36 mg daily. Decreases bacterial adherence to bladder lining.  - D-mannose powder (2 grams daily). This is an over-the-counter supplement which decreases bacterial adherence to bladder lining (it  is a sugar that inhibits bacterial  adherence to urothelial cells by binding to the pili of enteric bacteria). Take as per manufacturer recommendation. Can be used as an alternative or in addition to the concentrated cranberry supplement.  - Vitamin C supplement to acidify urine to minimize bacterial growth.  - Probiotic to maintain healthy vaginal microbiome to suppress bacteria at urethral opening. Brand recommendations: Estill Hemming (includes probiotic & D-mannose ), Feminine Balance (highest concentration of lactobacillus) or Hyperbiotic Pro 15.  Note for patients with diabetes:  - Be aware that D-mannose contains sugar.  Note for patients with interstitial cystitis (IC):  - Patients with IC should typically avoid cranberry/ PAC supplements and Vitamin C supplements due to their acidity, which may exacerbate IC-related bladder pain. - Symptoms of true bacterial UTI can overlap / mimic symptoms of an IC flare up. Antibiotic use is NOT indicated for IC flare ups. Urine culture needed prior to antibiotic treatment for IC patients. The goal is to minimize your risk for developing antibiotic-resistant bacteria.    Vaginal atrophy I Genitourinary syndrome of menopause (GSM):  What it is: Changes in the vaginal environment (including the vulva and urethra) including: Thinning of the epithelium (skin/ mucosa surface) Can contribute to urinary urgency and frequency Can contribute to dryness, itching, irritation of the vulvar and vaginal tissue Can contribute to pain with intercourse Can contribute to physical changes of the labia, vulva, and vagina such as: Narrowing of the vaginal opening Decreased vaginal length Loss of labial architecture Labial adhesions Pale color of vulvovaginal tissue Loss of pubic hair Allows bacteria to become adherent Results in increased risk for urinary tract infection (UTI) due to bacterial overgrowth and migration up the urethra into the bladder Change in vaginal pH (acid/ base balance) Allows for  alteration / disruption of the normal bacterial flora / microbiome Results in increased risk for urinary tract infection (UTI) due to bacterial overgrowth  Treatment options: Over-the-counter lubricants (see list below). Prescription vaginal estrogen replacement. Options: Topical vaginal estrogen (estradiol) cream: (Estrace, Premarin, compounded) Apply as directed Estring vaginal ring Exchanged every 3 months (either at home or in office by provider) Vagifem vaginal tablet Inserted nightly for 2 weeks then twice a week (long term) lntrarosa vaginal suppository Vaginal DHEA: converts to estrogen in vaginal tissue without systemic effect Inserted nightly (long term) 3. Vaginal laser therapy (Mona Edwina Gram touch) Performed in 3 treatments each 6 weeks apart (available in our [place] office). Can feel like a sunburn for 3-4 days after each treatment until new skin heals in. Usually not covered by insurance. Estimated cost is $1500 for all 3 sessions.  FYI regarding prescription vaginal estrogen treatment options: - All topical vaginal estrogen replacement options are equivalent in terms of efficacy. - Topical vaginal estrogen replacement will take about 3 months to be effective. - OK to have sex with any of the topical vaginal estrogen replacement options. - Topical vaginal estrogen replacement may sting/burn initially due to severe dryness, which will improve with ongoing treatment. - Studies have demonstrated negligible systemic absorption of low-dose intravaginal estrogen cream therefore the risk for cancer development or recurrence with this medication is minimal.  Genitourinary Syndrome of Menopause: AUA/SUFU/AUGS Guideline (2025) [place].at  Topical vaginal estrogen cream safe to use with breast cancer  history WomenInsider.com.ee  Topical vaginal estrogen cream safe to use with blood clot history [email or web address]   Lubricants and Moisturizers for Treating Genitourinary Syndrome of Menopause and Vulvovaginal Atrophy Treatment Comments I Available Products   Lubricants   Water -based  Ingredients: Deionized water , glycerin , propylene glycol; latex safe; rare irritation; dry out with extended sexual activity Astroglide, Good Clean Love, K-Y Jelly, Natural, Organic, Pink, Sliquid, Sylk, Yes    Oil Based Ingredients: avocado, olive, peanut, corn; latex safe; can be used with silicone products; staining; safe (unless peanut allergy); non-irritating Coconut oil, vegetable oil, vitamin E oil  Silicone-Based Ingredients: Silicone polymers; staining; typically nonirritating, long lasting; waterproof; should not be used with silicone dilators, sexual toys, or gynecologic products Astroglide X, Oceanus Ultra Pure, Pink Silicone, Pjur Eros, Replens Silky Smooth, Silicone Premium JO, SKYN, Uberlube, [employer] Based Minimize harm to sperm motility; designed for couples trying to conceive:  Astroglide TTC, Conceive Plus, PreSeed, Yes Baby  Fertility Friendly Minimize harm to sperm motility; designed for couples trying to conceive:  Astroglide, TTC, Conceive Plus, PreSeed, Yes Baby  Vaginal Moisturizers   Vaginal Moisturizers For maintenance use 1 to 3 times weekly; can benefit women with dryness, chafing with AOL, and recurrent vaginal infections irrespective of sexual activity timing Balance Active Menopause Vaginal Moisturizing Lubricant, Canesintima Intimate Moisturizer, Replens, Rephresh, Sylk Natural Intimate Moisturizer, Yes Vaginal  Moisturizer  Hybrids Properties of both water  and silicone-based products (combination of a vaginal lubricant and moisturizer); Non-irritating; good option for women with allergies and sensitivities Lubrigyn, Luvena  Suppositories Hyaluronic acid to retain moisture Revaree  Vulvar Soothing Creams/Oils    Medicated Creams Pain and burn relief; Ingredients: 4% Lidocaine , Aloe Vera gel Releveum ([place]ert Titanic)  Non-Medicated Creams For anti-itch and moisture/maintenance; Ingredients: Coconut oil, Avocado oil, Shea Butter, Olive oil, Vitamin E Vajuvenate, Vmagic  Oils for moisture/maintenance: Coconut oil, Vitamin E oil, Emu oil

## 2024-06-04 DIAGNOSIS — I129 Hypertensive chronic kidney disease with stage 1 through stage 4 chronic kidney disease, or unspecified chronic kidney disease: Secondary | ICD-10-CM | POA: Diagnosis not present

## 2024-06-04 DIAGNOSIS — R32 Unspecified urinary incontinence: Secondary | ICD-10-CM | POA: Diagnosis not present

## 2024-06-04 DIAGNOSIS — N1831 Chronic kidney disease, stage 3a: Secondary | ICD-10-CM | POA: Diagnosis not present

## 2024-06-04 DIAGNOSIS — I5032 Chronic diastolic (congestive) heart failure: Secondary | ICD-10-CM | POA: Diagnosis not present

## 2024-06-19 ENCOUNTER — Other Ambulatory Visit: Payer: Self-pay | Admitting: Family Medicine

## 2024-06-19 DIAGNOSIS — F411 Generalized anxiety disorder: Secondary | ICD-10-CM

## 2024-06-19 DIAGNOSIS — F320 Major depressive disorder, single episode, mild: Secondary | ICD-10-CM

## 2024-06-20 ENCOUNTER — Telehealth: Payer: Self-pay | Admitting: *Deleted

## 2024-06-20 NOTE — Telephone Encounter (Signed)
 Patient called wanting an earlier follow up with Dr. Rogers to discuss ongoing nausea and being unwell in general since stopping nilotinib  on 03/07/2024.  Planned follow up at end of the month cancelled at this point.  Patient refused to have labs done week prior to visit and was scheduled for day of.

## 2024-06-21 ENCOUNTER — Inpatient Hospital Stay

## 2024-06-21 ENCOUNTER — Ambulatory Visit: Payer: Self-pay

## 2024-06-21 NOTE — Telephone Encounter (Signed)
 FYI Only or Action Required?: FYI only for provider.  Patient was last seen in primary care on 04/20/2024 by Severa Rock HERO, FNP.  Called Nurse Triage reporting Hand Pain.  Symptoms began several weeks ago.  Interventions attempted: OTC medications: Tylenol .  Symptoms are: gradually worsening.  Triage Disposition: See Physician Within 24 Hours  Patient/caregiver understands and will follow disposition?: Yes   Appt scheduled tomorrow 1105 with DOD  Copied from CRM [id number]. Topic: Clinical - Red Word Triage >> Jun 21, 2024  9:13 AM Tonda B wrote: [facility] that prompted transfer to Nurse Triage: patient has numbness swelling and pain in her hand Reason for Disposition  Numbness (i.e., loss of sensation) in hand or fingers  (Exceptions: Just tingling; numbness present > 2 weeks.)  Answer Assessment - Initial Assessment Questions 1. ONSET: When did the pain start?     Several weeks  2. LOCATION: Where is the pain located?     Bilat hands  3. PAIN: How bad is the pain? (Scale 1-10; or mild, moderate, severe)     8/10 6. AGGRAVATING FACTORS: What makes the pain worse? (e.g., using computer)     Bending and movement  7. OTHER SYMPTOMS: Do you have any other symptoms? (e.g., fever, neck pain, numbness or tingling, rash, swelling)     Tingling/ Numbness and swelling  Protocols used: Hand Pain-A-AH

## 2024-06-21 NOTE — Telephone Encounter (Signed)
Noted  -LS

## 2024-06-22 ENCOUNTER — Ambulatory Visit (INDEPENDENT_AMBULATORY_CARE_PROVIDER_SITE_OTHER): Admitting: Family Medicine

## 2024-06-22 ENCOUNTER — Encounter: Payer: Self-pay | Admitting: Family Medicine

## 2024-06-22 VITALS — BP 120/73 | HR 86 | Temp 96.3°F | Ht 65.0 in | Wt 214.8 lb

## 2024-06-22 DIAGNOSIS — R202 Paresthesia of skin: Secondary | ICD-10-CM

## 2024-06-22 DIAGNOSIS — R110 Nausea: Secondary | ICD-10-CM

## 2024-06-22 NOTE — Progress Notes (Signed)
 Subjective:  Patient ID: Andrea Martinez, female    DOB: 12-Jun-1946, 78 y.o.   MRN: [id number]  Patient Care Team: Severa Rock HERO, FNP as PCP - General (Family Medicine) Rogers Hai, MD as Medical Oncologist (Medical Oncology) Lenis Ethelle ORN, MD as Consulting Physician (Endocrinology) Burnetta Aures, MD as Consulting Physician (Orthopedic Surgery)   Chief Complaint:  Hand Pain (Patient is having bilateral had pain, fingers are swollen, stinging, some numbness)   HPI: Andrea Martinez is a 78 y.o. female presenting on 06/22/2024 for Hand Pain (Patient is having bilateral had pain, fingers are swollen, stinging, some numbness)  The patient presents with swollen and shaking hands.  She has been experiencing swollen and shaking hands for a couple of weeks. The swelling is described as 'squishy' and affects both hands. She also experiences stiffness and a stinging sensation, which sometimes prevents her from opening her hands fully.  She has persistent nausea, which worsened after discontinuing Tasigna , a chemotherapy agent, at the end of March. While on Tasigna , she experienced mild nausea without significant stomach upset. She was previously prescribed medication for nausea, but it caused significant dizziness, leading her to discontinue its use. Currently, she uses an over-the-counter chewable medication for nausea, which she finds ineffective. No pain is associated with her nausea, but she feels sick to her stomach.  She has a history of chemotherapy use for over nine years, which was abruptly discontinued at the end of March. Since stopping the medication, she reports increased sweating, chills, and difficulty sleeping. She also mentions feeling anxious and experiencing weight fluctuations, although her weight has not visibly changed. Her husband suspects fluid retention. Attempts at physical activity are limited by excessive sweating and chills.  She has a history of kidney  issues and was recently diagnosed with a severe kidney infection. She was treated in the emergency room with morphine  and antibiotics, including a 500 mg dose four times a day and another antibiotic at 500 mg twice a day. She reports improvement in her kidney pain following this treatment.  She mentions that she has been off vitamin B12 supplements for a short period, as her blood work previously indicated normal levels. However, her symptoms of hand stiffness and swelling began after discontinuing the supplement. She has been undergoing regular blood work to monitor her levels since stopping the supplement.          Relevant past medical, surgical, family, and social history reviewed and updated as indicated.  Allergies and medications reviewed and updated. Data reviewed: Chart in Epic.   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 Mirna   Lipoma    left upper arm   Mixed hyperlipidemia    Osteoporosis    Pericardial effusion    a. HCAP complicated by pericardial effusion requiring pericardial window 10/2016 and large L pleural effusion requring VATS.   Pleural effusion    a. s/p VATS 2017.   Pneumonia 10/2016   PONV (postoperative nausea and vomiting)    history of   Prolonged QT interval    Urine incontinence    UTI (lower urinary tract infection)    frequent   Vitamin D  deficiency     Past Surgical History:  Procedure Laterality Date   BIOPSY  03/20/2022   Procedure: BIOPSY;  Surgeon: Cindie Carlin POUR, DO;  [facility]: [facility];  Service: Endoscopy;;  gastric lesion vs. gastric ulcer   BIOPSY  06/28/2022   Procedure: BIOPSY;  Surgeon: Cindie Carlin POUR, DO;  [facility];  Service: Endoscopy;;   BIOPSY  05/10/2023   Procedure: BIOPSY;   Surgeon: Cindie Carlin POUR, DO;  [facility];  Service: Endoscopy;;   BRAVO PH STUDY  12/04/2012   Procedure: BRAVO PH STUDY;  Surgeon: Margo LITTIE Haddock, MD;  [facility]: [facility];  Service: Endoscopy;  Laterality: N/A;   CHOLECYSTECTOMY N/A 01/03/2014   Procedure: LAPAROSCOPIC CHOLECYSTECTOMY WITH INTRAOPERATIVE CHOLANGIOGRAM;  Surgeon: Debby LABOR. Cornett, MD;  Location: [facility];  Service: General;  Laterality: N/A;   COLONOSCOPY  01/2004   [facility], few small tics   COLONOSCOPY WITH PROPOFOL  N/A 05/10/2023   Procedure: COLONOSCOPY WITH PROPOFOL ;  Surgeon: Cindie Carlin POUR, DO;  [facility];  Service: Endoscopy;  Laterality: N/A;  1245pm, asa 3   ESOPHAGOGASTRODUODENOSCOPY  02/2010   gastric ulcers   ESOPHAGOGASTRODUODENOSCOPY  12/04/2012   DOQ:[id number] gastritis (inflammation) was found in the gastric antrum; multiple biopsies The duodenal mucosa showed no abnormalities in the bulb and second portion of the duodenum   ESOPHAGOGASTRODUODENOSCOPY (EGD) WITH PROPOFOL  N/A 03/20/2022   Procedure: ESOPHAGOGASTRODUODENOSCOPY (EGD) WITH PROPOFOL ;  Surgeon: Cindie Carlin POUR, DO;  [facility];  Service: Endoscopy;  Laterality: N/A;   ESOPHAGOGASTRODUODENOSCOPY (EGD) WITH PROPOFOL  N/A 06/28/2022   Procedure: ESOPHAGOGASTRODUODENOSCOPY (EGD) WITH PROPOFOL ;  Surgeon: Cindie Carlin POUR, DO;  [facility];  Service: Endoscopy;  Laterality: N/A;  800   ESOPHAGOGASTRODUODENOSCOPY (EGD) WITH PROPOFOL  N/A 05/10/2023   Procedure: ESOPHAGOGASTRODUODENOSCOPY (EGD) WITH PROPOFOL ;  Surgeon: Cindie Carlin POUR, DO;  [facility];  Service: Endoscopy;  Laterality: N/A;   JOINT REPLACEMENT Right 10/22/2019   KYPHOPLASTY N/A 02/14/2019   Procedure: KYPHOPLASTY T12 and L1;  Surgeon: Burnetta Aures, MD;  Location: [facility];  Service: Orthopedics;  Laterality: N/A;  120 mins   LASIK     LIPOMA EXCISION  08/02/2011   left shoulder   NOSE SURGERY      PARTIAL HYSTERECTOMY     vaginal at age 57 years of age   POLYPECTOMY  05/10/2023   Procedure: POLYPECTOMY;  Surgeon: Cindie Carlin POUR, DO;  [facility];  Service: Endoscopy;;   TOE DEBRIDEMENT Left 1962   lt great toe   TOTAL HIP ARTHROPLASTY Right 11/18/2021   Procedure: TOTAL HIP ARTHROPLASTY ANTERIOR APPROACH;  Surgeon: Melodi Lerner, MD;  Location: [facility];  Service: Orthopedics;  Laterality: Right;   TOTAL HIP ARTHROPLASTY Left 06/29/2023   Procedure: LEFT TOTAL HIP ARTHROPLASTY ANTERIOR APPROACH;  Surgeon: Melodi Lerner, MD;  Location: [facility];  Service: Orthopedics;  Laterality: Left;   TOTAL KNEE ARTHROPLASTY Right 10/22/2019   Procedure: TOTAL KNEE ARTHROPLASTY;  Surgeon: Melodi Lerner, MD;  Location: [facility];  Service: Orthopedics;  Laterality: Right;    TOTAL KNEE ARTHROPLASTY Left 03/03/2020   Procedure: TOTAL KNEE ARTHROPLASTY;  Surgeon: Melodi Lerner, MD;  Location: [facility];  Service: Orthopedics;  Laterality: Left;    TRANSFORAMINAL LUMBAR INTERBODY FUSION (TLIF) WITH PEDICLE SCREW FIXATION 1 LEVEL N/A 01/28/2021   Procedure: TRANSFORAMINAL LUMBAR INTERBODY FUSION (TLIF) LUMBAR FIVE-SACRAL ONE;  Surgeon: Burnetta Aures, MD;  Location: [facility];  Service: Orthopedics;  Laterality: N/A;  4 hrs  VIDEO ASSISTED THORACOSCOPY (VATS)/EMPYEMA Left 10/25/2016   Procedure: VIDEO ASSISTED THORACOSCOPY (VATS), BRONCH,DRAINAGE OF PLEURAL EFFUSION,PERICARDIAL WINDOW WITH DRAINAGE OF PERICARDIAL FLUID, TEE;  Surgeon: Elspeth JAYSON Millers, MD;  Location: [facility];  Service: Thoracic;  Laterality: Left;   VIDEO BRONCHOSCOPY N/A 10/25/2016   Procedure: VIDEO BRONCHOSCOPY;  Surgeon: Elspeth JAYSON Millers, MD;  Location: [facility];  Service: Thoracic;  Laterality: N/A;    Social History   Socioeconomic History   Marital status: Married    Spouse name: Zachary   Number of children: 2   Years of education: 12th grade   Highest education level: Not on file  Occupational History    Occupation: retired    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   2 granddaughters.   Retired HR.    Social Drivers of Corporate investment banker Strain: Low Risk  (05/23/2024)   Overall Financial Resource Strain (CARDIA)    Difficulty of Paying Living Expenses: Not hard at all  Food Insecurity: No Food Insecurity (05/23/2024)   Hunger Vital Sign    Worried About Running Out of Food in the Last Year: Never true    Ran Out of Food in the Last Year: Never true  Transportation Needs: No Transportation Needs (05/23/2024)   PRAPARE - Administrator, Civil Service (Medical): No    Lack of Transportation (Non-Medical): No  Physical Activity: Sufficiently Active (05/23/2024)   Exercise Vital Sign    Days of Exercise per Week: 7 days    Minutes of Exercise per Session: 30 min  Stress: Stress Concern Present (05/23/2024)   [employer] of Occupational Health - Occupational Stress Questionnaire    Feeling of Stress : Very much  Social Connections: Moderately Isolated (05/23/2024)   Social Connection and Isolation Panel    Frequency of Communication with Friends and Family: More than three times a week    Frequency of Social Gatherings with Friends and Family: More than three times a week    Attends Religious Services: Never    Database administrator or Organizations: No    Attends Banker Meetings: Never    Marital Status: Married  Catering manager Violence: Not At Risk (05/23/2024)   Humiliation, Afraid, Rape, and Kick questionnaire    Fear of Current or  Ex-Partner: No    Emotionally Abused: No    Physically Abused: No    Sexually Abused: No    Outpatient Encounter Medications as of 06/22/2024  Medication Sig   Acetaminophen  (ACETAMIN PO) Take by mouth as needed.   cetirizine (ZYRTEC) 10 MG tablet Take 10 mg by mouth daily.   cholecalciferol  (VITAMIN D3) 25 MCG (1000 UNIT) tablet Take 4,000 Units by mouth daily.   desvenlafaxine  (PRISTIQ ) 25 MG 24 hr tablet TAKE 1 TABLET (25 MG TOTAL) BY MOUTH DAILY.   estradiol  (ESTRACE ) 0.1 MG/GM vaginal cream Discard plastic applicator. Insert a blueberry size amount (approximately 1 gram) of cream on fingertip inside vagina at bedtime every  night for 1 week then every other night. For long term use.   lactose free nutrition (BOOST) LIQD Take 237 mLs by mouth 3 (three) times daily between meals.   levothyroxine  (SYNTHROID ) 75 MCG tablet Take 1 tablet (75 mcg total) by mouth daily before breakfast.   Multiple Vitamins-Minerals (MULTIVITAMIN WOMEN PO) Take 1 tablet by mouth daily.   nilotinib  (TASIGNA ) 150 MG capsule Take 2 capsules (300 mg total) by mouth every 12 (twelve) hours.   Probiotic Product (DIGESTIVE ADV PREBIOT+PROBIOT) CHEW Chew 2 each by mouth daily.   rosuvastatin  (CRESTOR ) 5 MG tablet Take 1 tablet (5 mg total) by mouth at bedtime.   Vibegron  (GEMTESA ) 75 MG TABS Take 1 tablet (75 mg total) by mouth daily.   No facility-administered encounter medications on file as of 06/22/2024.    Allergies  Allergen Reactions   Nucynta [Tapentadol Hcl] Swelling   Codeine Nausea Only and Other (See Comments)    Headache   Sulfonamide Derivatives Nausea Only and Other (See Comments)    Headache   Nexium [Esomeprazole Magnesium ] Nausea Only   Sulfa Antibiotics Nausea Only    Headache    Fluconazole Rash    Redness and blistering on left thigh    Pertinent ROS per HPI, otherwise unremarkable      Objective:  BP 120/73   Pulse 86   Temp (!) 96.3 F (35.7 C)   Ht 5' 5 (1.651 m)   Wt 214 lb  12.8 oz (97.4 kg)   SpO2 95%   BMI 35.74 kg/m    Wt Readings from Last 3 Encounters:  06/22/24 214 lb 12.8 oz (97.4 kg)  05/23/24 216 lb (98 kg)  05/23/24 216 lb (98 kg)    Physical Exam Vitals and nursing note reviewed.  Constitutional:      General: She is not in acute distress.    Appearance: Normal appearance. She is obese. She is ill-appearing (chronically ill) and diaphoretic (chronic). She is not toxic-appearing.  HENT:     Head: Normocephalic and atraumatic.     Nose: Nose normal.     Mouth/Throat:     Mouth: Mucous membranes are moist.  Eyes:     Conjunctiva/sclera: Conjunctivae normal.     Pupils: Pupils are equal, round, and reactive to light.  Cardiovascular:     Rate and Rhythm: Normal rate and regular rhythm.     Pulses: Normal pulses.     Heart sounds: Normal heart sounds.  Pulmonary:     Effort: Pulmonary effort is normal.     Breath sounds: Normal breath sounds.  Musculoskeletal:     Right wrist: Normal.     Left wrist: Normal.     Right hand: Tenderness present. No swelling, deformity, lacerations or bony tenderness. Normal range of motion. Normal strength. Normal sensation. There is no disruption of two-point discrimination. Normal capillary refill.     Left hand: Tenderness present. No swelling, deformity, lacerations or bony tenderness. Normal range of motion. Normal strength. Normal sensation. There is no disruption of two-point discrimination. Normal capillary refill. Normal pulse.     Cervical back: Neck supple.     Right lower leg: No edema.     Left lower leg: No edema.  Skin:    General: Skin is warm.     Capillary Refill: Capillary refill takes less than 2 seconds.  Neurological:     General: No focal deficit present.     Mental Status: She is alert and oriented to person, place, and  time.     Motor: Tremor present.  Psychiatric:        Mood and Affect: Mood normal.        Behavior: Behavior normal.        Thought Content: Thought content  normal.        Judgment: Judgment normal.    Physical Exam   CARDIOVASCULAR: Peripheral circulation normal.        Results for orders placed or performed in visit on 05/31/24  Microscopic Examination   Collection Time: 05/31/24  9:52 AM   Urine  Result Value Ref Range   WBC, UA 6-10 (A) 0 - 5 /hpf   RBC, Urine 0-2 0 - 2 /hpf   Epithelial Cells (non renal) >10 (A) 0 - 10 /hpf   Bacteria, UA Few (A) None seen/Few  Urinalysis, Routine w reflex microscopic   Collection Time: 05/31/24  9:52 AM  Result Value Ref Range   Specific Gravity, UA 1.020 1.005 - 1.030   pH, UA 5.5 5.0 - 7.5   Color, UA Yellow Yellow   Appearance Ur Clear Clear   Leukocytes,UA 1+ (A) Negative   Protein,UA Negative Negative/Trace   Glucose, UA Negative Negative   Ketones, UA Negative Negative   RBC, UA Negative Negative   Bilirubin, UA Negative Negative   Urobilinogen, Ur 0.2 0.2 - 1.0 mg/dL   Nitrite, UA Negative Negative   Microscopic Examination See below:   BLADDER SCAN AMB NON-IMAGING   Collection Time: 05/31/24 10:08 AM  Result Value Ref Range   Scan Result 0        Pertinent labs & imaging results that were available during my care of the patient were reviewed by me and considered in my medical decision making.  Assessment & Plan:  Akeila Lana was seen today for hand pain.  Diagnoses and all orders for this visit:  Paresthesia of hand, bilateral -     Anemia Profile B -     CMP14+EGFR -     Magnesium   Nausea -     Anemia Profile B -     CMP14+EGFR -     Magnesium       Hand swelling and stiffness Bilateral hand swelling and stiffness for a couple of weeks, with associated shaking and stinging sensations. Differential diagnosis includes vitamin B12 deficiency, electrolyte imbalances, or other underlying conditions. Recent cessation of vitamin B12 supplementation may be contributing to symptoms. Blood flow appears normal upon examination. - Order anemia profile to assess vitamin  B12 levels - Check potassium and magnesium  levels  Nausea Persistent nausea since discontinuation of nilotinib  three months ago. Nausea was present during treatment but has worsened since stopping the medication. She is taking over-the-counter remedies with limited relief. Possible relation to vitamin B12 deficiency or withdrawal effects from nilotinib . She has an upcoming appointment with her oncologist to discuss these symptoms. - Check vitamin B12 levels  Depression and anxiety Currently on medication for depression and anxiety but expresses a desire to discontinue due to side effects such as sweating and perceived weight gain. Advised against stopping the medication until further evaluation by the oncology team. She reports feeling anxious and experiencing sweating episodes. - Continue current medication for depression and anxiety until further evaluation by oncology team  Kidney infection Severe kidney infection treated with antibiotics. She received morphine  and antibiotics in the emergency room. - Follow up with nephrologist at the beginning of the month          Continue all  other maintenance medications.  Follow up plan: Return if symptoms worsen or fail to improve.   Continue healthy lifestyle choices, including diet (rich in fruits, vegetables, and lean proteins, and low in salt and simple carbohydrates) and exercise (at least 30 minutes of moderate physical activity daily).  Educational handout given for paresthesias   The above assessment and management plan was discussed with the patient. The patient verbalized understanding of and has agreed to the management plan. Patient is aware to call the clinic if they develop any new symptoms or if symptoms persist or worsen. Patient is aware when to return to the clinic for a follow-up visit. Patient educated on when it is appropriate to go to the emergency department.   Rosaline Bruns, FNP-C [facility] [phone]

## 2024-06-23 LAB — ANEMIA PROFILE B
Basophils Absolute: 0 x10E3/uL (ref 0.0–0.2)
Basos: 0 %
EOS (ABSOLUTE): 0.1 x10E3/uL (ref 0.0–0.4)
Eos: 1 %
Ferritin: 271 ng/mL — ABNORMAL HIGH (ref 15–150)
Folate: >20 ng/mL (ref >3.0)
Hematocrit: 39.9 % (ref 34.0–46.6)
Hemoglobin: 12.7 g/dL (ref 11.1–15.9)
Immature Grans (Abs): 0 x10E3/uL (ref 0.0–0.1)
Immature Granulocytes: 0 %
Iron Saturation: 11 % — ABNORMAL LOW (ref 15–55)
Iron: 34 ug/dL (ref 27–139)
Lymphocytes Absolute: 1.7 x10E3/uL (ref 0.7–3.1)
Lymphs: 14 %
MCH: 28.3 pg (ref 26.6–33.0)
MCHC: 31.8 g/dL (ref 31.5–35.7)
MCV: 89 fL (ref 79–97)
Monocytes Absolute: 0.8 x10E3/uL (ref 0.1–0.9)
Monocytes: 7 %
Neutrophils Absolute: 9.1 x10E3/uL — ABNORMAL HIGH (ref 1.4–7.0)
Neutrophils: 78 %
Platelets: 289 x10E3/uL (ref 150–450)
RBC: 4.48 x10E6/uL (ref 3.77–5.28)
RDW: 12.4 % (ref 11.7–15.4)
Retic Ct Pct: 1.8 % (ref 0.6–2.6)
Total Iron Binding Capacity: 323 ug/dL (ref 250–450)
UIBC: 289 ug/dL (ref 118–369)
Vitamin B-12: 1214 pg/mL (ref 232–1245)
WBC: 11.8 x10E3/uL — ABNORMAL HIGH (ref 3.4–10.8)

## 2024-06-23 LAB — CMP14+EGFR
ALT: 21 IU/L (ref 0–32)
AST: 32 IU/L (ref 0–40)
Albumin: 4.7 g/dL (ref 3.8–4.8)
Alkaline Phosphatase: 183 IU/L — ABNORMAL HIGH (ref 44–121)
BUN/Creatinine Ratio: 31 — ABNORMAL HIGH (ref 12–28)
BUN: 34 mg/dL — ABNORMAL HIGH (ref 8–27)
Bilirubin Total: 0.3 mg/dL (ref 0.0–1.2)
CO2: 20 mmol/L (ref 20–29)
Calcium: 10.2 mg/dL (ref 8.7–10.3)
Chloride: 102 mmol/L (ref 96–106)
Creatinine, Ser: 1.1 mg/dL — ABNORMAL HIGH (ref 0.57–1.00)
Globulin, Total: 3 g/dL (ref 1.5–4.5)
Glucose: 97 mg/dL (ref 70–99)
Potassium: 5.3 mmol/L — ABNORMAL HIGH (ref 3.5–5.2)
Sodium: 140 mmol/L (ref 134–144)
Total Protein: 7.7 g/dL (ref 6.0–8.5)
eGFR: 51 mL/min/1.73 — ABNORMAL LOW (ref >59)

## 2024-06-23 LAB — MAGNESIUM: Magnesium: 2.4 mg/dL — ABNORMAL HIGH (ref 1.6–2.3)

## 2024-06-24 ENCOUNTER — Ambulatory Visit: Payer: Self-pay | Admitting: Family Medicine

## 2024-06-25 ENCOUNTER — Inpatient Hospital Stay: Attending: Hematology | Admitting: Hematology

## 2024-06-25 ENCOUNTER — Inpatient Hospital Stay

## 2024-06-25 ENCOUNTER — Ambulatory Visit: Admitting: Hematology

## 2024-06-25 ENCOUNTER — Ambulatory Visit (HOSPITAL_COMMUNITY)
Admission: RE | Admit: 2024-06-25 | Discharge: 2024-06-25 | Disposition: A | Discharge status: Home / Self Care | Source: Ambulatory Visit | Attending: Hematology | Admitting: Hematology

## 2024-06-25 VITALS — BP 166/79 | HR 73 | Temp 97.6°F | Resp 20 | Wt 216.4 lb

## 2024-06-25 DIAGNOSIS — M1811 Unilateral primary osteoarthritis of first carpometacarpal joint, right hand: Secondary | ICD-10-CM | POA: Diagnosis not present

## 2024-06-25 DIAGNOSIS — C9210 Chronic myeloid leukemia, BCR/ABL-positive, not having achieved remission: Secondary | ICD-10-CM | POA: Insufficient documentation

## 2024-06-25 DIAGNOSIS — M7989 Other specified soft tissue disorders: Secondary | ICD-10-CM | POA: Insufficient documentation

## 2024-06-25 DIAGNOSIS — R7989 Other specified abnormal findings of blood chemistry: Secondary | ICD-10-CM | POA: Diagnosis not present

## 2024-06-25 DIAGNOSIS — J90 Pleural effusion, not elsewhere classified: Secondary | ICD-10-CM | POA: Insufficient documentation

## 2024-06-25 DIAGNOSIS — R110 Nausea: Secondary | ICD-10-CM | POA: Diagnosis not present

## 2024-06-25 DIAGNOSIS — M1812 Unilateral primary osteoarthritis of first carpometacarpal joint, left hand: Secondary | ICD-10-CM | POA: Diagnosis not present

## 2024-06-25 DIAGNOSIS — M79641 Pain in right hand: Secondary | ICD-10-CM | POA: Diagnosis not present

## 2024-06-25 DIAGNOSIS — Z9221 Personal history of antineoplastic chemotherapy: Secondary | ICD-10-CM | POA: Diagnosis not present

## 2024-06-25 DIAGNOSIS — M79642 Pain in left hand: Secondary | ICD-10-CM | POA: Diagnosis not present

## 2024-06-25 DIAGNOSIS — Z801 Family history of malignant neoplasm of trachea, bronchus and lung: Secondary | ICD-10-CM | POA: Insufficient documentation

## 2024-06-25 DIAGNOSIS — Z79899 Other long term (current) drug therapy: Secondary | ICD-10-CM | POA: Diagnosis not present

## 2024-06-25 DIAGNOSIS — D649 Anemia, unspecified: Secondary | ICD-10-CM | POA: Diagnosis not present

## 2024-06-25 LAB — CBC WITH DIFFERENTIAL/PLATELET
Abs Immature Granulocytes: 0.05 K/uL (ref 0.00–0.07)
Basophils Absolute: 0 K/uL (ref 0.0–0.1)
Basophils Relative: 0 %
Eosinophils Absolute: 0.1 K/uL (ref 0.0–0.5)
Eosinophils Relative: 1 %
HCT: 37.7 % (ref 36.0–46.0)
Hemoglobin: 12.5 g/dL (ref 12.0–15.0)
Immature Granulocytes: 0 %
Lymphocytes Relative: 15 %
Lymphs Abs: 1.7 K/uL (ref 0.7–4.0)
MCH: 29.5 pg (ref 26.0–34.0)
MCHC: 33.2 g/dL (ref 30.0–36.0)
MCV: 88.9 fL (ref 80.0–100.0)
Monocytes Absolute: 0.8 K/uL (ref 0.1–1.0)
Monocytes Relative: 7 %
Neutro Abs: 9 K/uL — ABNORMAL HIGH (ref 1.7–7.7)
Neutrophils Relative %: 77 %
Platelets: 267 K/uL (ref 150–400)
RBC: 4.24 MIL/uL (ref 3.87–5.11)
RDW: 12.5 % (ref 11.5–15.5)
WBC: 11.7 K/uL — ABNORMAL HIGH (ref 4.0–10.5)
nRBC: 0 % (ref 0.0–0.2)

## 2024-06-25 LAB — COMPREHENSIVE METABOLIC PANEL WITH GFR
ALT: 18 U/L (ref 0–44)
AST: 27 U/L (ref 15–41)
Albumin: 4.1 g/dL (ref 3.5–5.0)
Alkaline Phosphatase: 142 U/L — ABNORMAL HIGH (ref 38–126)
Anion gap: 10 (ref 5–15)
BUN: 46 mg/dL — ABNORMAL HIGH (ref 8–23)
CO2: 22 mmol/L (ref 22–32)
Calcium: 9.5 mg/dL (ref 8.9–10.3)
Chloride: 104 mmol/L (ref 98–111)
Creatinine, Ser: 1.09 mg/dL — ABNORMAL HIGH (ref 0.44–1.00)
GFR, Estimated: 52 mL/min — ABNORMAL LOW (ref >60)
Glucose, Bld: 107 mg/dL — ABNORMAL HIGH (ref 70–99)
Potassium: 4.6 mmol/L (ref 3.5–5.1)
Sodium: 136 mmol/L (ref 135–145)
Total Bilirubin: 0.8 mg/dL (ref 0.0–1.2)
Total Protein: 8.1 g/dL (ref 6.5–8.1)

## 2024-06-25 LAB — TSH: TSH: 0.204 u[IU]/mL — ABNORMAL LOW (ref 0.350–4.500)

## 2024-06-25 LAB — C-REACTIVE PROTEIN: CRP: 2.2 mg/dL — ABNORMAL HIGH (ref <1.0)

## 2024-06-25 LAB — LACTATE DEHYDROGENASE: LDH: 137 U/L (ref 98–192)

## 2024-06-25 LAB — SEDIMENTATION RATE: Sed Rate: 66 mm/h — ABNORMAL HIGH (ref 0–30)

## 2024-06-25 MED ORDER — ONDANSETRON HCL 4 MG PO TABS: 4.0000 mg | ORAL_TABLET | Freq: Three times a day (TID) | ORAL | 3 refills | Status: AC | PRN

## 2024-06-25 NOTE — Progress Notes (Addendum)
 [facility] [address] [place] [address]    Clinic Day:  06/25/2024  Referring physician: Severa Rock HERO, FNP  Patient Care Team: Severa Rock HERO, FNP as PCP - General (Family Medicine) Rogers Hai, MD as Medical Oncologist (Medical Oncology) Lenis Ethelle ORN, MD as Consulting Physician (Endocrinology) Burnetta Aures, MD as Consulting Physician (Orthopedic Surgery)   ASSESSMENT & PLAN:   Assessment: 1.  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 -Left pleural effusion and pericardial effusion requiring pericardial window on 10/25/2016.  Fluid cytology negative. -Started back on Nilotinib  300 mg every 12 hours on 12/06/2016. - She stopped nilotinib  on 03/07/2024   2.  Daytime sweats: -He had a long history of daytime sweats.  She had extensive work-up including a CT scan CAP, which showed no evidence of malignancy.  Hepatic morphology suspicious for cirrhosis.    Plan: 1.  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  Since stopping neratinib, she developed sweating, chills and nausea and decreased sleep.  The symptoms have been stable since last visit. - She has tried taking Compazine  which made her lightheaded.  She could not tolerate Compazine  at 5 mg dose. - I reviewed labs from today: Normal LFTs.  Creatinine mildly elevated and normal.  CBC grossly normal with elevated white count of 11.7 and neutrophil count 9000. - BCR/ABL by PCR is pending.  BCR/ABL by PCR in June was undetectable. - She reported all the symptoms since nilotinib  was stopped.  I have discussed that she may start back on nilotinib .  She would like to wait until next visit in 6 weeks. - She complained of swelling of hands for the last 1 to 2 weeks.  She is not able to make a grip. - For her nausea, I will send Zofran  4 mg every 8 hours as needed. - For her hand swelling, we will do further workup with ANA, rheumatoid factor, ESR, CRP, hand  x-rays. - Recommend follow-up in 6 weeks with repeat CML labs.  2.  Normocytic anemia: - She received Feraheme  in March.  Hemoglobin is normal today at 12.5.    Orders Placed This Encounter  Procedures   DG Hand Complete Left    Standing Status:   Future    Number of Occurrences:   1    Expected Date:   06/25/2024    Expiration Date:   09/23/2024    Reason for Exam (SYMPTOM  OR DIAGNOSIS REQUIRED):   hand swelling/pain    Preferred imaging location?:   [facility]   DG Hand Complete Right    Standing Status:   Future    Number of Occurrences:   1    Expected Date:   06/25/2024    Expiration Date:   09/23/2024    Reason for Exam (SYMPTOM  OR DIAGNOSIS REQUIRED):   hand pain/swelling    Preferred imaging location?:   [facility]   TSH    Standing Status:   Future    Number of Occurrences:   1    Expected Date:   06/25/2024    Expiration Date:   09/23/2024   Rheumatoid factor    Standing Status:   Future    Number of Occurrences:   1    Expected Date:   06/25/2024    Expiration Date:   09/23/2024   Sedimentation rate    Standing Status:   Future    Number of Occurrences:  1    Expected Date:   06/25/2024    Expiration Date:   09/23/2024   C-reactive protein    Standing Status:   Future    Number of Occurrences:   1    Expected Date:   06/25/2024    Expiration Date:   09/23/2024   ANA, IFA (with reflex)    Standing Status:   Future    Number of Occurrences:   1    Expected Date:   06/25/2024    Expiration Date:   06/25/2025      LILLETTE Verneta SAUNDERS Teague,acting as a scribe for Alean Stands, MD.,have documented all relevant documentation on the behalf of Alean Stands, MD,as directed by  Alean Stands, MD while in the presence of Alean Stands, MD.  I, Alean Stands MD, have reviewed the above documentation for accuracy and completeness, and I agree with the above.     Alean Stands, MD   7/14/20251:11 PM  CHIEF COMPLAINT:    Diagnosis: CML    Cancer Staging  No matching staging information was found for the patient.    Prior Therapy: Hydrea 1000 mg and Allopurinol 300 mg daily, Tasigna  discontinued on 03/07/2024  Current Therapy: Close surveillance   HISTORY OF PRESENT ILLNESS:   Oncology History  CML (chronic myeloid leukemia) (HCC)  11/21/2015 Initial Diagnosis   Chronic myelogenous leukemia (CML), BCR-ABL1-positive (HCC)   11/21/2015 Miscellaneous   Se[facility] by Dr. Perri [facility]   11/21/2015 -  Chemotherapy   Hydrea 1000 mg and Allopurinol 300 mg daily (due to high uric acid level)   11/21/2015 Tumor Marker   BCR P210 Ratio of Fusion to Control, Blood 0.8941    P210 IS % Ratio, Blood 85.834%     01/02/2016 - 10/23/2016 Chemotherapy   Tasigna    01/08/2016 Miscellaneous   EKG- QT/QTc: 382/440 ms   01/16/2016 Miscellaneous   EKG- QT/QTc: 364/419   03/30/2016 Tumor Marker   b2a2 transcript: 0.317 b3a2 transcript: 0.194   10/23/2016 - 11/05/2016 Hospital Admission   large Left Pleural Effusion and Pericardial Effusion that is now s/p VATS with Drainage, and Drainage of Pericardial Fluid with Window and Biopsy of Pericardium   12/06/2016 -  Chemotherapy   Tasigna  restarted    05/24/2017 Adverse Reaction   Prolonged QTc on EKG; likely d/t chemo. Tasigna  ON HOLD until next visit.    06/10/2017 -  Chemotherapy   Resumed Tasigna    07/13/2017 Miscellaneous   Evaluated by Cardiology for prolonged QTc. Per Dr. Charls, patient's QT intervals are not prolonged to the point of potentially promoting a ventricular arrhythmia. He thinks she can continue on current medical therapy for her CML with serial ECG monitoring.   12/27/2017 Miscellaneous   EKG: QT/QTc 398/435      INTERVAL HISTORY:   Larenda is a 78 y.o. female presenting to clinic today for follow up of CML. She was last seen by me on 05/28/24.  Today, she states that she is doing well overall. Her appetite level is at 50%. Her energy level is  at 25%.  PAST MEDICAL HISTORY:   Past Medical History: Past Medical History:  Diagnosis Date   Anxiety    denies   Arthritis    Chronic myelogenous leukemia (CML), 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 Mirna   Lipoma    left upper arm  Mixed hyperlipidemia    Osteoporosis    Pericardial effusion    a. HCAP complicated by pericardial effusion requiring pericardial window 10/2016 and large L pleural effusion requring VATS.   Pleural effusion    a. s/p VATS 2017.   Pneumonia 10/2016   PONV (postoperative nausea and vomiting)    history of   Prolonged QT interval    Urine incontinence    UTI (lower urinary tract infection)    frequent   Vitamin D  deficiency     Surgical History: Past Surgical History:  Procedure Laterality Date   BIOPSY  03/20/2022   Procedure: BIOPSY;  Surgeon: Cindie Carlin POUR, DO;  [facility];  Service: Endoscopy;;  gastric lesion vs. gastric ulcer   BIOPSY  06/28/2022   Procedure: BIOPSY;  Surgeon: Cindie Carlin POUR, DO;  [facility];  Service: Endoscopy;;   BIOPSY  05/10/2023   Procedure: BIOPSY;  Surgeon: Cindie Carlin POUR, DO;  [facility];  Service: Endoscopy;;   BRAVO PH STUDY  12/04/2012   Procedure: BRAVO PH STUDY;  Surgeon: Margo LITTIE Haddock, MD;  [facility];  Service: Endoscopy;  Laterality: N/A;   CHOLECYSTECTOMY N/A 01/03/2014   Procedure: LAPAROSCOPIC CHOLECYSTECTOMY WITH INTRAOPERATIVE CHOLANGIOGRAM;  Surgeon: Debby LABOR. Cornett, MD;  Location: [facility];  Service: General;  Laterality: N/A;   COLONOSCOPY  01/2004   DR [facility], few small tics   COLONOSCOPY WITH PROPOFOL  N/A 05/10/2023   Procedure: COLONOSCOPY WITH PROPOFOL ;  Surgeon: Cindie Carlin POUR, DO;  [facility];  Service: Endoscopy;  Laterality: N/A;  1245pm, asa 3    ESOPHAGOGASTRODUODENOSCOPY  02/2010   gastric ulcers   ESOPHAGOGASTRODUODENOSCOPY  12/04/2012   DOQ:Zmndpcz gastritis (inflammation) was found in the gastric antrum; multiple biopsies The duodenal mucosa showed no abnormalities in the bulb and second portion of the duodenum   ESOPHAGOGASTRODUODENOSCOPY (EGD) WITH PROPOFOL  N/A 03/20/2022   Procedure: ESOPHAGOGASTRODUODENOSCOPY (EGD) WITH PROPOFOL ;  Surgeon: Cindie Carlin POUR, DO;  [facility];  Service: Endoscopy;  Laterality: N/A;   ESOPHAGOGASTRODUODENOSCOPY (EGD) WITH PROPOFOL  N/A 06/28/2022   Procedure: ESOPHAGOGASTRODUODENOSCOPY (EGD) WITH PROPOFOL ;  Surgeon: Cindie Carlin POUR, DO;  [facility];  Service: Endoscopy;  Laterality: N/A;  800   ESOPHAGOGASTRODUODENOSCOPY (EGD) WITH PROPOFOL  N/A 05/10/2023   Procedure: ESOPHAGOGASTRODUODENOSCOPY (EGD) WITH PROPOFOL ;  Surgeon: Cindie Carlin POUR, DO;  [facility];  Service: Endoscopy;  Laterality: N/A;   JOINT REPLACEMENT Right 10/22/2019   KYPHOPLASTY N/A 02/14/2019   Procedure: KYPHOPLASTY T12 and L1;  Surgeon: Burnetta Aures, MD;  [facility]: [facility];  Service: Orthopedics;  Laterality: N/A;  120 mins   LASIK     LIPOMA EXCISION  08/02/2011   left shoulder   NOSE SURGERY     PARTIAL HYSTERECTOMY     vaginal at age 74 years of age   POLYPECTOMY  05/10/2023   Procedure: POLYPECTOMY;  Surgeon: Cindie Carlin POUR, DO;  [facility];  Service: Endoscopy;;   TOE DEBRIDEMENT Left 1962   lt great toe   TOTAL HIP ARTHROPLASTY Right 11/18/2021   Procedure: TOTAL HIP ARTHROPLASTY ANTERIOR APPROACH;  Surgeon: Melodi Lerner, MD;  [facility]: [facility];  Service: Orthopedics;  Laterality: Right;   TOTAL HIP ARTHROPLASTY Left 06/29/2023   Procedure: LEFT TOTAL HIP ARTHROPLASTY ANTERIOR APPROACH;  Surgeon: Melodi Lerner, MD;  Location: [facility];  Service: Orthopedics;  Laterality: Left;   TOTAL KNEE ARTHROPLASTY Right 10/22/2019   Procedure: TOTAL KNEE  ARTHROPLASTY;   Surgeon: Melodi Lerner, MD;  Location: [facility];  Service: Orthopedics;  Laterality: Right;    TOTAL KNEE ARTHROPLASTY Left 03/03/2020   Procedure: TOTAL KNEE ARTHROPLASTY;  Surgeon: Melodi Lerner, MD;  Location: [facility];  Service: Orthopedics;  Laterality: Left;    TRANSFORAMINAL LUMBAR INTERBODY FUSION (TLIF) WITH PEDICLE SCREW FIXATION 1 LEVEL N/A 01/28/2021   Procedure: TRANSFORAMINAL LUMBAR INTERBODY FUSION (TLIF) LUMBAR FIVE-SACRAL ONE;  Surgeon: Burnetta Aures, MD;  Location: [facility];  Service: Orthopedics;  Laterality: N/A;  4 hrs   VIDEO ASSISTED THORACOSCOPY (VATS)/EMPYEMA Left 10/25/2016   Procedure: VIDEO ASSISTED THORACOSCOPY (VATS), BRONCH,DRAINAGE OF PLEURAL EFFUSION,PERICARDIAL WINDOW WITH DRAINAGE OF PERICARDIAL FLUID, TEE;  Surgeon: Elspeth JAYSON Millers, MD;  Location: [facility];  Service: Thoracic;  Laterality: Left;   VIDEO BRONCHOSCOPY N/A 10/25/2016   Procedure: VIDEO BRONCHOSCOPY;  Surgeon: Elspeth JAYSON Millers, MD;  Location: [facility];  Service: Thoracic;  Laterality: N/A;    Social History: Social History   Socioeconomic History   Marital status: Married    Spouse name: Zachary   Number of children: 2   Years of education: 12th grade   Highest education level: Not on file  Occupational History   Occupation: retired    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   2 granddaughters.   Retired HR.    Social Drivers of Corporate investment banker Strain: Low Risk  (05/23/2024)   Overall Financial Resource Strain (CARDIA)    Difficulty of Paying Living  Expenses: Not hard at all  Food Insecurity: No Food Insecurity (05/23/2024)   Hunger Vital Sign    Worried About Running Out of Food in the Last Year: Never true    Ran Out of Food in the Last Year: Never true  Transportation Needs: No Transportation Needs (05/23/2024)   PRAPARE - Administrator, Civil Service (Medical): No    Lack of Transportation (Non-Medical): No  Physical Activity: Sufficiently Active (05/23/2024)   Exercise Vital Sign    Days of Exercise per Week: 7 days    Minutes of Exercise per Session: 30 min  Stress: Stress Concern Present (05/23/2024)   [employer] of Occupational Health - Occupational Stress Questionnaire    Feeling of Stress : Very much  Social Connections: Moderately Isolated (05/23/2024)   Social Connection and Isolation Panel    Frequency of Communication with Friends and Family: More than three times a week    Frequency of Social Gatherings with Friends and Family: More than three times a week    Attends Religious Services: Never    Database administrator or Organizations: No    Attends Banker Meetings: Never    Marital Status: Married  Catering manager Violence: Not At Risk (05/23/2024)   Humiliation, Afraid, Rape, and Kick questionnaire    Fear of Current or Ex-Partner: No    Emotionally Abused: No    Physically Abused: No    Sexually Abused: No  Family History: Family History  Problem Relation Age of Onset   Lung cancer Father    COPD Mother    Stroke Mother    Thyroid  disease Mother    Colon cancer Neg Hx    Anesthesia problems Neg Hx    Hypotension Neg Hx    Malignant hyperthermia Neg Hx    Pseudochol deficiency Neg Hx     Current Medications:  Current Outpatient Medications:    Acetaminophen  (ACETAMIN PO), Take by mouth as needed., Disp: , Rfl:    cetirizine (ZYRTEC) 10 MG tablet, Take 10 mg by mouth daily., Disp: , Rfl:    cholecalciferol  (VITAMIN D3) 25 MCG (1000 UNIT) tablet, Take 4,000 Units by  mouth daily., Disp: , Rfl:    desvenlafaxine  (PRISTIQ ) 25 MG 24 hr tablet, TAKE 1 TABLET (25 MG TOTAL) BY MOUTH DAILY., Disp: 90 tablet, Rfl: 0   estradiol  (ESTRACE ) 0.1 MG/GM vaginal cream, Discard plastic applicator. Insert a blueberry size amount (approximately 1 gram) of cream on fingertip inside vagina at bedtime every night for 1 week then every other night. For long term use., Disp: 30 g, Rfl: 3   lactose free nutrition (BOOST) LIQD, Take 237 mLs by mouth 3 (three) times daily between meals., Disp: , Rfl:    levothyroxine  (SYNTHROID ) 75 MCG tablet, Take 1 tablet (75 mcg total) by mouth daily before breakfast., Disp: 90 tablet, Rfl: 1   Multiple Vitamins-Minerals (MULTIVITAMIN WOMEN PO), Take 1 tablet by mouth daily., Disp: , Rfl:    nilotinib  (TASIGNA ) 150 MG capsule, Take 2 capsules (300 mg total) by mouth every 12 (twelve) hours., Disp: 120 capsule, Rfl: 2   ondansetron  (ZOFRAN ) 4 MG tablet, Take 1 tablet (4 mg total) by mouth every 8 (eight) hours as needed for nausea or vomiting., Disp: 60 tablet, Rfl: 3   Probiotic Product (DIGESTIVE ADV PREBIOT+PROBIOT) CHEW, Chew 2 each by mouth daily., Disp: , Rfl:    rosuvastatin  (CRESTOR ) 5 MG tablet, Take 1 tablet (5 mg total) by mouth at bedtime., Disp: 90 tablet, Rfl: 1   Vibegron  (GEMTESA ) 75 MG TABS, Take 1 tablet (75 mg total) by mouth daily., Disp: , Rfl:    Allergies: Allergies  Allergen Reactions   Nucynta [Tapentadol Hcl] Swelling   Codeine Nausea Only and Other (See Comments)    Headache   Sulfonamide Derivatives Nausea Only and Other (See Comments)    Headache   Nexium [Esomeprazole Magnesium ] Nausea Only   Sulfa Antibiotics Nausea Only    Headache    Fluconazole Rash    Redness and blistering on left thigh    REVIEW OF SYSTEMS:   Review of Systems  Constitutional:  Positive for chills. Negative for fatigue and fever.  HENT:   Negative for lump/mass, mouth sores, nosebleeds, sore throat and trouble swallowing.   Eyes:   Negative for eye problems.  Respiratory:  Negative for cough and shortness of breath.   Cardiovascular:  Negative for chest pain, leg swelling and palpitations.  Gastrointestinal:  Positive for nausea. Negative for abdominal pain, constipation, diarrhea and vomiting.  Genitourinary:  Negative for bladder incontinence, difficulty urinating, dysuria, frequency, hematuria and nocturia.   Musculoskeletal:  Negative for arthralgias, back pain, flank pain, myalgias and neck pain.  Skin:  Negative for itching and rash.  Neurological:  Positive for headaches. Negative for dizziness and numbness.  Hematological:  Does not bruise/bleed easily.  Psychiatric/Behavioral:  Negative for depression, sleep disturbance and suicidal ideas. The patient is nervous/anxious.   All other systems  reviewed and are negative.    VITALS:   Blood pressure (!) 166/79, pulse 73, temperature 97.6 F (36.4 C), temperature source Oral, resp. rate 20, weight 216 lb 6.4 oz (98.2 kg), SpO2 100%.  Wt Readings from Last 3 Encounters:  06/25/24 216 lb 6.4 oz (98.2 kg)  06/22/24 214 lb 12.8 oz (97.4 kg)  05/23/24 216 lb (98 kg)    Body mass index is 36.01 kg/m.  Performance status (ECOG): 1 - Symptomatic but completely ambulatory  PHYSICAL EXAM:   Physical Exam Vitals and nursing note reviewed. Exam conducted with a chaperone present.  Constitutional:      Appearance: Normal appearance.  Cardiovascular:     Rate and Rhythm: Normal rate and regular rhythm.     Pulses: Normal pulses.     Heart sounds: Normal heart sounds.  Pulmonary:     Effort: Pulmonary effort is normal.     Breath sounds: Normal breath sounds.  Abdominal:     Palpations: Abdomen is soft. There is no hepatomegaly, splenomegaly or mass.     Tenderness: There is no abdominal tenderness.  Musculoskeletal:     Right lower leg: No edema.     Left lower leg: No edema.  Lymphadenopathy:     Cervical: No cervical adenopathy.     Right cervical: No  superficial, deep or posterior cervical adenopathy.    Left cervical: No superficial, deep or posterior cervical adenopathy.     Upper Body:     Right upper body: No supraclavicular or axillary adenopathy.     Left upper body: No supraclavicular or axillary adenopathy.  Neurological:     General: No focal deficit present.     Mental Status: She is alert and oriented to person, place, and time.  Psychiatric:        Mood and Affect: Mood normal.        Behavior: Behavior normal.     LABS:      Latest Ref Rng & Units 06/25/2024   10:43 AM 06/22/2024   11:29 AM 05/23/2024   10:19 AM  CBC  WBC 4.0 - 10.5 K/uL 11.7  11.8  9.0   Hemoglobin 12.0 - 15.0 g/dL 87.4  87.2  88.2   Hematocrit 36.0 - 46.0 % 37.7  39.9  36.3   Platelets 150 - 400 K/uL 267  289  229       Latest Ref Rng & Units 06/25/2024   10:43 AM 06/22/2024   11:29 AM 05/23/2024   10:19 AM  CMP  Glucose 70 - 99 mg/dL 892  97  894   BUN 8 - 23 mg/dL 46  34  24   Creatinine 0.44 - 1.00 mg/dL 8.90  8.89  8.95   Sodium 135 - 145 mmol/L 136  140  140   Potassium 3.5 - 5.1 mmol/L 4.6  5.3  4.2   Chloride 98 - 111 mmol/L 104  102  107   CO2 22 - 32 mmol/L 22  20  22    Calcium  8.9 - 10.3 mg/dL 9.5  89.7  9.4   Total Protein 6.5 - 8.1 g/dL 8.1  7.7  7.4   Total Bilirubin 0.0 - 1.2 mg/dL 0.8  0.3  0.8   Alkaline Phos 38 - 126 U/L 142  183  125   AST 15 - 41 U/L 27  32  30   ALT 0 - 44 U/L 18  21  18       No results found for:  CEA1, CEA / No results found for: CEA1, CEA No results found for: PSA1 No results found for: CAN199 No results found for: CAN125  No results found for: STEPHANY RINGS, A1GS, A2GS, BETS, BETA2SER, GAMS, MSPIKE, SPEI Lab Results  Component Value Date   TIBC 323 06/22/2024   TIBC 339 05/16/2024   TIBC 257 02/22/2024   FERRITIN 271 (H) 06/22/2024   FERRITIN 150 05/16/2024   FERRITIN 390 (H) 02/22/2024   IRONPCTSAT 11 (L) 06/22/2024   IRONPCTSAT 12 05/16/2024    IRONPCTSAT 15 02/22/2024   Lab Results  Component Value Date   LDH 137 06/25/2024   LDH 128 05/16/2024   LDH 147 11/21/2023     STUDIES:   DG Hand Complete Right Result Date: 06/25/2024 CLINICAL DATA:  Bilateral hand pain and swelling. EXAM: RIGHT HAND - COMPLETE 3+ VIEW COMPARISON:  None Available. FINDINGS: There is no evidence of fracture or dislocation. Mild degenerative changes seen involving the first carpometacarpal joint. Soft tissues are unremarkable. IMPRESSION: Mild osteoarthritis of first carpometacarpal joint. No acute abnormality seen. Electronically Signed   By: Lynwood Landy Raddle M.D.   On: 06/25/2024 12:55   DG Hand Complete Left Result Date: 06/25/2024 CLINICAL DATA:  Bilateral hand pain and swelling. EXAM: LEFT HAND - COMPLETE 3+ VIEW COMPARISON:  None Available. FINDINGS: There is no evidence of fracture or dislocation. Moderate degenerative changes seen involving first carpometacarpal joint. Soft tissues are unremarkable. IMPRESSION: Moderate osteoarthritis of first carpometacarpal joint. No acute abnormality seen. Electronically Signed   By: Lynwood Landy Raddle M.D.   On: 06/25/2024 12:54

## 2024-06-25 NOTE — Patient Instructions (Addendum)
 [facility] at [facility] Discharge Instructions   You were seen and examined today by Dr. Rogers.  He reviewed the results of your lab work which are normal/stable.   We will see you back in 6 weeks.   Return as scheduled.    Thank you for choosing  [facility] at [facility] to provide your oncology and hematology care.  To afford each patient quality time with our provider, please arrive at least 15 minutes before your scheduled appointment time.   If you have a lab appointment with the Cancer Center please come in thru the Main Entrance and check in at the main information desk.  You need to re-schedule your appointment should you arrive 10 or more minutes late.  We strive to give you quality time with our providers, and arriving late affects you and other patients whose appointments are after yours.  Also, if you no show three or more times for appointments you may be dismissed from the clinic at the providers discretion.     Again, thank you for choosing [facility].  Our hope is that these requests will decrease the amount of time that you wait before being seen by our physicians.       _____________________________________________________________  Should you have questions after your visit to [facility], please contact our office at [phone] and follow the prompts.  Our office hours are 8:00 a.m. and 4:30 p.m. Monday - Friday.  Please note that voicemails left after 4:00 p.m. may not be returned until the following business day.  We are closed weekends and major holidays.  You do have access to a nurse 24-7, just call the main number to the clinic [phone] and do not press any options, hold on the line and a nurse will answer the phone.    For prescription refill requests, have your pharmacy contact our office and allow 72 hours.    Due to Covid, you will need to wear a mask upon entering the  hospital. If you do not have a mask, a mask will be given to you at the Main Entrance upon arrival. For doctor visits, patients may have 1 support person age 74 or older with them. For treatment visits, patients can not have anyone with them due to social distancing guidelines and our immunocompromised population.

## 2024-06-26 ENCOUNTER — Telehealth: Payer: Self-pay | Admitting: *Deleted

## 2024-06-26 LAB — RHEUMATOID FACTOR: Rheumatoid fact SerPl-aCnc: 10.8 [IU]/mL (ref <14.0)

## 2024-06-26 NOTE — Telephone Encounter (Addendum)
 Medication Prior Authorization Status  Processed CoverMyMeds KEY: [id number]  N/A Today  Per [employer] Medicare Determination Form  Case ID: [id number], RX #: [id number]   Effective Pending through Pending.  Called [employer] [phone]).  Dijaque reports no request on file   Returned to request. Read messages to him as requested then a note appeared the request was faxed to [employer].  Expect response within 5 business days. No receipt reported from Dijaque. We both agreed for me to fax request.   No further questions or needs. [id number] Request faxed to [employer] [phone]).

## 2024-06-27 LAB — FANA STAINING PATTERNS: Speckled Pattern: 24529 — ABNORMAL HIGH

## 2024-06-27 LAB — ANTINUCLEAR ANTIBODIES, IFA: ANA Ab, IFA: POSITIVE — AB

## 2024-07-01 LAB — BCR-ABL1, CML/ALL, PCR, QUANT
E1A2 Transcript: <0.0032 %
Interpretation (BCRAL):: NEGATIVE
b2a2 transcript: <0.0032 %
b3a2 transcript: <0.0032 %

## 2024-07-02 ENCOUNTER — Telehealth: Payer: Self-pay | Admitting: *Deleted

## 2024-07-02 NOTE — Telephone Encounter (Signed)
 Patient's labs reviewed by Dr. Rogers.  Patient made aware that her Inflammatory markers and Lupus were positive.  Will place an ASAP referral to Rheumatology.

## 2024-07-03 NOTE — Telephone Encounter (Signed)
 Connected with [employer] representative [phone]) to check status of ondansetron .  Medication authorized 06/27/2024 through 12/12/2024.

## 2024-07-06 ENCOUNTER — Telehealth: Payer: Self-pay

## 2024-07-06 NOTE — Telephone Encounter (Signed)
 Patient called asking if referral had been sent to [facility] Rheumatology.  Called patient to confirm referral had been sent on 07/02/24.

## 2024-07-09 ENCOUNTER — Other Ambulatory Visit

## 2024-07-09 ENCOUNTER — Ambulatory Visit: Admitting: Hematology

## 2024-07-09 ENCOUNTER — Inpatient Hospital Stay: Admitting: Hematology

## 2024-07-09 ENCOUNTER — Telehealth: Payer: Self-pay

## 2024-07-09 NOTE — Telephone Encounter (Signed)
 Patient called Friday wanting to know if she should start her chemotherapy back due to issues patient is having.   Dr. Katragadda was asked and stated for her to start chemo pill again.  Patient called and aware of Dr. Charmain answer to her question and will start taking the chemo pill as directed.

## 2024-07-11 DIAGNOSIS — E119 Type 2 diabetes mellitus without complications: Secondary | ICD-10-CM | POA: Diagnosis not present

## 2024-07-11 DIAGNOSIS — I10 Essential (primary) hypertension: Secondary | ICD-10-CM | POA: Diagnosis not present

## 2024-07-11 DIAGNOSIS — R778 Other specified abnormalities of plasma proteins: Secondary | ICD-10-CM | POA: Diagnosis not present

## 2024-07-11 DIAGNOSIS — N189 Chronic kidney disease, unspecified: Secondary | ICD-10-CM | POA: Diagnosis not present

## 2024-07-13 NOTE — Progress Notes (Addendum)
 Name: Andrea Martinez DOB: 1946-08-15 MRN: [id number]  History of Present Illness: Andrea Martinez is a 78 y.o. female who presents today for follow up visit at [facility]. Relevant history includes: 1. OAB with urinary frequency, nocturia, urgency, and urge incontinence. - Had urodynamic testing on 07/06/2021 at [facility]. - Not a safe candidate for anticholinergic medications due to Martinez for side effects based on patient's age, comorbidities, and pre-existing IBS. 2. Cystocele. 3. Vaginal atrophy.  At last visit on 05/31/2024: The plan was: Gemtesa  (Vibegron ) 75 mg daily. Start topical vaginal estrogen cream.  Return in about 6 weeks (around 07/12/2024) for OAB, with UA & PVR.  Today: She denies seeing any significant improvement in her OAB symptoms when taking Gemtesa  (Vibegron ) 75 mg daily and she ultimately stopped it because it caused headaches and nausea. She reports ongoing urinary frequency, nocturia x2, urgency, and urge incontinence. Continues using approximately 3 diapers per day on average.   She denies dysuria, gross hematuria, straining to void, or sensations of incomplete emptying.  She reports using the topical vaginal estrogen cream once per day.  She reports external vaginal itching. Denies vaginal pain, bleeding, or abnormal discharge.   Medications: Current Outpatient Medications  Medication Sig Dispense Refill   Acetaminophen  (ACETAMIN PO) Take by mouth as needed.     cetirizine (ZYRTEC) 10 MG tablet Take 10 mg by mouth daily.     desvenlafaxine  (PRISTIQ ) 25 MG 24 hr tablet TAKE 1 TABLET (25 MG TOTAL) BY MOUTH DAILY. 90 tablet 0   estradiol  (ESTRACE ) 0.1 MG/GM vaginal cream Discard plastic applicator. Insert a blueberry size amount (approximately 1 gram) of cream on fingertip inside vagina at bedtime every night for 1 week then every other night. For long term use. 30 g 3   lactose free nutrition (BOOST) LIQD Take 237 mLs by mouth 3 (three)  times daily between meals.     levothyroxine  (SYNTHROID ) 75 MCG tablet Take 1 tablet (75 mcg total) by mouth daily before breakfast. 90 tablet 1   mirabegron  ER (MYRBETRIQ ) 25 MG TB24 tablet Take 1 tablet (25 mg total) by mouth daily. 30 tablet 11   Multiple Vitamins-Minerals (MULTIVITAMIN WOMEN PO) Take 1 tablet by mouth daily.     nilotinib  (TASIGNA ) 150 MG capsule Take 2 capsules (300 mg total) by mouth every 12 (twelve) hours. 120 capsule 2   ondansetron  (ZOFRAN ) 4 MG tablet Take 1 tablet (4 mg total) by mouth every 8 (eight) hours as needed for nausea or vomiting. 60 tablet 3   Probiotic Product (DIGESTIVE ADV PREBIOT+PROBIOT) CHEW Chew 2 each by mouth daily.     rosuvastatin  (CRESTOR ) 5 MG tablet Take 1 tablet (5 mg total) by mouth at bedtime. 90 tablet 1   cholecalciferol  (VITAMIN D3) 25 MCG (1000 UNIT) tablet Take 4,000 Units by mouth daily. (Patient not taking: Reported on 07/16/2024)     No current facility-administered medications for this visit.    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 Mirna   Lipoma    left upper arm   Mixed hyperlipidemia    Osteoporosis    Pericardial effusion    a. HCAP complicated by pericardial effusion requiring pericardial window 10/2016 and large L pleural effusion requring VATS.   Pleural effusion    a. s/p VATS 2017.   Pneumonia 10/2016   PONV  (postoperative nausea and vomiting)    history of   Prolonged QT interval    Urine incontinence    UTI (lower urinary tract infection)    frequent   Vitamin D  deficiency    Past Surgical History:  Procedure Laterality Date   BIOPSY  03/20/2022   Procedure: BIOPSY;  Surgeon: Cindie Carlin POUR, DO;  [facility]: [facility];  Service: Endoscopy;;  gastric lesion vs. gastric ulcer   BIOPSY  06/28/2022   Procedure: BIOPSY;  Surgeon: Cindie Carlin POUR, DO;  [facility]: [facility];  Service: Endoscopy;;   BIOPSY  05/10/2023   Procedure: BIOPSY;  Surgeon: Cindie Carlin POUR, DO;  [facility];  Service: Endoscopy;;   BRAVO PH STUDY  12/04/2012   Procedure: BRAVO PH STUDY;  Surgeon: Margo LITTIE Haddock, MD;  [facility]: [facility];  Service: Endoscopy;  Laterality: N/A;   CHOLECYSTECTOMY N/A 01/03/2014   Procedure: LAPAROSCOPIC CHOLECYSTECTOMY WITH INTRAOPERATIVE CHOLANGIOGRAM;  Surgeon: Debby LABOR. Cornett, MD;  Location: [facility];  Service: General;  Laterality: N/A;   COLONOSCOPY  01/2004   [facility], few small tics   COLONOSCOPY WITH PROPOFOL  N/A 05/10/2023   Procedure: COLONOSCOPY WITH PROPOFOL ;  Surgeon: Cindie Carlin POUR, DO;  [facility];  Service: Endoscopy;  Laterality: N/A;  1245pm, asa 3   ESOPHAGOGASTRODUODENOSCOPY  02/2010   gastric ulcers   ESOPHAGOGASTRODUODENOSCOPY  12/04/2012   DOQ:Zmndpcz gastritis (inflammation) was found in the gastric antrum; multiple biopsies The duodenal mucosa showed no abnormalities in the bulb and second portion of the duodenum   ESOPHAGOGASTRODUODENOSCOPY (EGD) WITH PROPOFOL  N/A 03/20/2022   Procedure: ESOPHAGOGASTRODUODENOSCOPY (EGD) WITH PROPOFOL ;  Surgeon: Cindie Carlin POUR, DO;  [facility];  Service: Endoscopy;  Laterality: N/A;   ESOPHAGOGASTRODUODENOSCOPY (EGD) WITH PROPOFOL  N/A 06/28/2022   Procedure: ESOPHAGOGASTRODUODENOSCOPY (EGD) WITH PROPOFOL ;  Surgeon: Cindie Carlin POUR, DO;  [facility];  Service: Endoscopy;  Laterality: N/A;  800   ESOPHAGOGASTRODUODENOSCOPY (EGD) WITH PROPOFOL  N/A 05/10/2023   Procedure: ESOPHAGOGASTRODUODENOSCOPY (EGD) WITH PROPOFOL ;  Surgeon: Cindie Carlin POUR, DO;  [facility];  Service: Endoscopy;  Laterality: N/A;   JOINT REPLACEMENT Right 10/22/2019   KYPHOPLASTY N/A 02/14/2019   Procedure: KYPHOPLASTY T12 and L1;  Surgeon: Burnetta Aures, MD;  Location: [facility];  Service: Orthopedics;  Laterality: N/A;  120 mins   LASIK     LIPOMA EXCISION  08/02/2011   left shoulder   NOSE SURGERY     PARTIAL HYSTERECTOMY     vaginal at age 26 years of age   POLYPECTOMY  05/10/2023   Procedure: POLYPECTOMY;  Surgeon: Cindie Carlin POUR, DO;  [facility];  Service: Endoscopy;;   TOE DEBRIDEMENT Left 1962   lt great toe   TOTAL HIP ARTHROPLASTY Right 11/18/2021   Procedure: TOTAL HIP ARTHROPLASTY ANTERIOR APPROACH;  Surgeon: Melodi Lerner, MD;  Location: [facility];  Service: Orthopedics;  Laterality:  Right;   TOTAL HIP ARTHROPLASTY Left 06/29/2023   Procedure: LEFT TOTAL HIP ARTHROPLASTY ANTERIOR APPROACH;  Surgeon: Melodi Lerner, MD;  Location: [facility];  Service: Orthopedics;  Laterality: Left;   TOTAL KNEE ARTHROPLASTY Right 10/22/2019   Procedure: TOTAL KNEE ARTHROPLASTY;  Surgeon: Melodi Lerner, MD;  Location: [facility];  Service: Orthopedics;  Laterality: Right;    TOTAL KNEE ARTHROPLASTY Left 03/03/2020   Procedure: TOTAL KNEE ARTHROPLASTY;  Surgeon: Melodi Lerner, MD;  Location: [facility];  Service: Orthopedics;  Laterality: Left;    TRANSFORAMINAL LUMBAR INTERBODY FUSION (TLIF) WITH PEDICLE SCREW FIXATION 1 LEVEL N/A 01/28/2021   Procedure: TRANSFORAMINAL LUMBAR INTERBODY FUSION (TLIF) LUMBAR FIVE-SACRAL ONE;  Surgeon: Burnetta Aures, MD;  Location: [facility];  Service: Orthopedics;  Laterality: N/A;  4 hrs   VIDEO ASSISTED THORACOSCOPY (VATS)/EMPYEMA Left 10/25/2016   Procedure: VIDEO ASSISTED THORACOSCOPY (VATS),  BRONCH,DRAINAGE OF PLEURAL EFFUSION,PERICARDIAL WINDOW WITH DRAINAGE OF PERICARDIAL FLUID, TEE;  Surgeon: Elspeth JAYSON Millers, MD;  Location: [facility];  Service: Thoracic;  Laterality: Left;   VIDEO BRONCHOSCOPY N/A 10/25/2016   Procedure: VIDEO BRONCHOSCOPY;  Surgeon: Elspeth JAYSON Millers, MD;  Location: [facility];  Service: Thoracic;  Laterality: N/A;   Family History  Problem Relation Age of Onset   Lung cancer Father    COPD Mother    Stroke Mother    Thyroid  disease Mother    Colon cancer Neg Hx    Anesthesia problems Neg Hx    Hypotension Neg Hx    Malignant hyperthermia Neg Hx    Pseudochol deficiency Neg Hx    Social History   Socioeconomic History   Marital status: Married    Spouse name: Zachary   Number of children: 2   Years of education: 12th grade   Highest education level: Not on file  Occupational History   Occupation: retired    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   2 granddaughters.   Retired HR.    Social Drivers of Corporate investment banker Strain: Low Martinez  (05/23/2024)   Overall Financial Resource Strain (CARDIA)    Difficulty of Paying Living Expenses: Not hard at all  Food Insecurity: No Food Insecurity (05/23/2024)   Hunger Vital Sign    Worried About Running Out of Food in the Last Year: Never true    Ran Out of Food in the Last Year: Never true  Transportation Needs: No Transportation Needs (05/23/2024)   PRAPARE - Administrator, Civil Service (Medical): No    Lack of Transportation (Non-Medical): No  Physical Activity:  Sufficiently Active (05/23/2024)   Exercise Vital Sign    Days of Exercise per Week: 7 days    Minutes of Exercise per Session: 30 min  Stress: Stress Concern Present (05/23/2024)   [employer] of Occupational Health - Occupational Stress Questionnaire    Feeling of Stress : Very much  Social Connections: Moderately Isolated (05/23/2024)   Social Connection and Isolation Panel  Frequency of Communication with Friends and Family: More than three times a week    Frequency of Social Gatherings with Friends and Family: More than three times a week    Attends Religious Services: Never    Database administrator or Organizations: No    Attends Banker Meetings: Never    Marital Status: Married  Catering manager Violence: Not At Martinez (05/23/2024)   Humiliation, Afraid, Rape, and Kick questionnaire    Fear of Current or Ex-Partner: No    Emotionally Abused: No    Physically Abused: No    Sexually Abused: No    Review of Systems Constitutional: Patient denies any unintentional weight loss or change in strength lntegumentary: Patient denies any rashes or pruritus Cardiovascular: Patient denies chest pain or syncope Respiratory: Patient denies shortness of breath Gastrointestinal: Patient denies nausea, vomiting, constipation, or diarrhea  Musculoskeletal: Patient denies muscle cramps or weakness Neurologic: Patient denies convulsions or seizures Allergic/Immunologic: Patient denies recent allergic reaction(s) Hematologic/Lymphatic: Patient denies bleeding tendencies Endocrine: Patient denies heat/cold intolerance  GU: As per HPI.  OBJECTIVE Vitals:   07/16/24 1305  BP: 121/60  Pulse: 81   There is no height or weight on file to calculate BMI.  Physical Examination Constitutional: No obvious distress; patient is non-toxic appearing  Cardiovascular: No visible lower extremity edema.  Respiratory: The patient does not have audible wheezing/stridor; respirations do  not appear labored  Gastrointestinal: Abdomen non-distended Musculoskeletal: Normal ROM of UEs  Skin: No obvious rashes/open sores  Neurologic: CN 2-12 grossly intact Psychiatric: Answered questions appropriately with normal affect  Hematologic/Lymphatic/Immunologic: No obvious bruises or sites of spontaneous bleeding  Urine microscopy: 6-10 WBC/hpf, >30 RBC/hpf, 0 bacteria PVR: 45 ml  ASSESSMENT OAB (overactive bladder) - Plan: Urinalysis, Routine w reflex microscopic, BLADDER SCAN AMB NON-IMAGING, mirabegron  ER (MYRBETRIQ ) 25 MG TB24 tablet  Urinary frequency - Plan: mirabegron  ER (MYRBETRIQ ) 25 MG TB24 tablet  Painful bladder spasm  Urge incontinence - Plan: mirabegron  ER (MYRBETRIQ ) 25 MG TB24 tablet  Urinary urgency - Plan: mirabegron  ER (MYRBETRIQ ) 25 MG TB24 tablet  We discussed the symptoms of overactive bladder (OAB), which include urinary urgency, frequency, nocturia, with or without urge incontinence.  - Failed behavioral therapy - Failed Gemtesa  (due to side effects / ineffective) - Not a safe candidate for anticholinergic medications due to Martinez for side effects based on patient's age, comorbidities, and pre-existing IBS. - Not a safe candidate for PTNS due to bilateral hip and knee metal implant(s).   Discussed alternative treatment options including:  - Trial of Myrbetriq  (Mirabegron ) 25 mg daily  Bladder Botox injections  She decided to proceed with rial of Myrbetriq  (Mirabegron ) 25 mg daily. Will plan for follow up in 6 weeks for symptom recheck. Pt verbalized understanding and agreement. All questions were answered.  PLAN Advised the following: 1. Myrbetriq  (Mirabegron ) 25 mg daily. 2. Topical vaginal estrogen cream every other night. 3. Return in about 6 weeks (around 08/27/2024) for OAB, with UA & PVR.  Orders Placed This Encounter  Procedures   Urinalysis, Routine w reflex microscopic   BLADDER SCAN AMB NON-IMAGING    It has been explained that the  patient is to follow regularly with their PCP in addition to all other providers involved in their care and to follow instructions provided by these respective offices. Patient advised to contact urology clinic if any urologic-pertaining questions, concerns, new symptoms or problems arise in the interim period.  There are no Patient Instructions on file for  this visit.  Electronically signed by:  Lauraine JAYSON Oz, FNP   07/16/24    1:26 PM

## 2024-07-16 ENCOUNTER — Ambulatory Visit: Admitting: Urology

## 2024-07-16 ENCOUNTER — Encounter: Payer: Self-pay | Admitting: Urology

## 2024-07-16 VITALS — BP 121/60 | HR 81

## 2024-07-16 DIAGNOSIS — N952 Postmenopausal atrophic vaginitis: Secondary | ICD-10-CM

## 2024-07-16 DIAGNOSIS — N3281 Overactive bladder: Secondary | ICD-10-CM

## 2024-07-16 DIAGNOSIS — N3289 Other specified disorders of bladder: Secondary | ICD-10-CM | POA: Diagnosis not present

## 2024-07-16 DIAGNOSIS — N3941 Urge incontinence: Secondary | ICD-10-CM | POA: Diagnosis not present

## 2024-07-16 DIAGNOSIS — R3915 Urgency of urination: Secondary | ICD-10-CM

## 2024-07-16 DIAGNOSIS — R350 Frequency of micturition: Secondary | ICD-10-CM

## 2024-07-16 LAB — URINALYSIS, ROUTINE W REFLEX MICROSCOPIC
Bilirubin, UA: NEGATIVE
Glucose, UA: NEGATIVE
Ketones, UA: NEGATIVE
Nitrite, UA: NEGATIVE
Protein,UA: NEGATIVE
Specific Gravity, UA: 1.015 (ref 1.005–1.030)
Urobilinogen, Ur: 0.2 mg/dL (ref 0.2–1.0)
pH, UA: 6 (ref 5.0–7.5)

## 2024-07-16 LAB — MICROSCOPIC EXAMINATION
Bacteria, UA: NONE SEEN
RBC, Urine: >30 /HPF — AB (ref 0–2)

## 2024-07-16 LAB — BLADDER SCAN AMB NON-IMAGING: Scan Result: 45

## 2024-07-16 MED ORDER — MIRABEGRON ER 25 MG PO TB24: 25.0000 mg | ORAL_TABLET | Freq: Every day | ORAL | 11 refills | Status: AC

## 2024-07-19 ENCOUNTER — Encounter: Payer: Self-pay | Admitting: *Deleted

## 2024-07-19 DIAGNOSIS — N1832 Chronic kidney disease, stage 3b: Secondary | ICD-10-CM | POA: Diagnosis not present

## 2024-07-19 DIAGNOSIS — I5032 Chronic diastolic (congestive) heart failure: Secondary | ICD-10-CM | POA: Diagnosis not present

## 2024-07-19 DIAGNOSIS — E611 Iron deficiency: Secondary | ICD-10-CM | POA: Diagnosis not present

## 2024-07-19 DIAGNOSIS — I129 Hypertensive chronic kidney disease with stage 1 through stage 4 chronic kidney disease, or unspecified chronic kidney disease: Secondary | ICD-10-CM | POA: Diagnosis not present

## 2024-07-24 ENCOUNTER — Ambulatory Visit: Admitting: Family Medicine

## 2024-08-01 ENCOUNTER — Inpatient Hospital Stay: Attending: Hematology

## 2024-08-01 DIAGNOSIS — C9210 Chronic myeloid leukemia, BCR/ABL-positive, not having achieved remission: Secondary | ICD-10-CM | POA: Insufficient documentation

## 2024-08-01 DIAGNOSIS — D509 Iron deficiency anemia, unspecified: Secondary | ICD-10-CM

## 2024-08-01 LAB — CBC WITH DIFFERENTIAL/PLATELET
Abs Immature Granulocytes: 0.06 K/uL (ref 0.00–0.07)
Basophils Absolute: 0 K/uL (ref 0.0–0.1)
Basophils Relative: 0 %
Eosinophils Absolute: 0.2 K/uL (ref 0.0–0.5)
Eosinophils Relative: 1 %
HCT: 39 % (ref 36.0–46.0)
Hemoglobin: 12.4 g/dL (ref 12.0–15.0)
Immature Granulocytes: 0 %
Lymphocytes Relative: 16 %
Lymphs Abs: 2.3 K/uL (ref 0.7–4.0)
MCH: 28.6 pg (ref 26.0–34.0)
MCHC: 31.8 g/dL (ref 30.0–36.0)
MCV: 89.9 fL (ref 80.0–100.0)
Monocytes Absolute: 1.1 K/uL — ABNORMAL HIGH (ref 0.1–1.0)
Monocytes Relative: 8 %
Neutro Abs: 10.7 K/uL — ABNORMAL HIGH (ref 1.7–7.7)
Neutrophils Relative %: 75 %
Platelets: 268 K/uL (ref 150–400)
RBC: 4.34 MIL/uL (ref 3.87–5.11)
RDW: 12.8 % (ref 11.5–15.5)
WBC: 14.4 K/uL — ABNORMAL HIGH (ref 4.0–10.5)
nRBC: 0 % (ref 0.0–0.2)

## 2024-08-04 LAB — BCR-ABL1, CML/ALL, PCR, QUANT
E1A2 Transcript: <0.0032 %
Interpretation (BCRAL):: NEGATIVE
b2a2 transcript: <0.0032 %
b3a2 transcript: <0.0032 %

## 2024-08-06 ENCOUNTER — Other Ambulatory Visit: Payer: Self-pay | Admitting: "Endocrinology

## 2024-08-06 DIAGNOSIS — E039 Hypothyroidism, unspecified: Secondary | ICD-10-CM

## 2024-08-14 NOTE — Progress Notes (Unsigned)
 Patient Care Team: Severa Rock HERO, FNP as PCP - General (Family Medicine) Lenis Ethelle ORN, MD as Consulting Physician (Endocrinology) Burnetta Aures, MD as Consulting Physician (Orthopedic Surgery)  Clinic Day:  08/14/2024  Referring physician: Severa Rock HERO, FNP   CHIEF COMPLAINT:  CC: CML in chronic phase  Andrea Martinez 78 y.o. female was transferred to my care after her prior physician has left.   ASSESSMENT & PLAN:   Assessment & Plan: Andrea Martinez  is a 78 y.o. female with CML in chronic phase  Assessment & Plan CML (chronic myeloid leukemia) (HCC) Patient with chronic phase CML diagnosed in 2017 and has been in remission since 2019. Was on nilotinib  all this years and was discontinued in June 2025. Patient took it for few days the day before August as she was symptomatic.  - Recommended patient to hold nilotinib  at this time as patient has been in remission for several years  Will repeat BCR-ABL in 6 weeks and follow-up at that time  Hyperthyroidism Latest TSH: 0.204 Asymptomatic  - Being managed by Dr.Nida (endocrinology) Elevated antinuclear antibody (ANA) level Patient has elevated ANA with a ratio of 1:640 She is symptomatic with joint pains and unable to bend her fingers X-rays of hand consistent with osteoarthritis findings  - Patient cannot take NSAIDs because of chronic kidney disease - Pain is not controlled with Tylenol  - Will prescribe tramadol  to be taken twice daily as needed -Patient has an upcoming rheumatology appointment Insomnia, unspecified type Patient reports severe insomnia and crying at night Reported that she is not taking antidepressants prescribed because of concerns about weight gain and them not helping  - Recommend starting melatonin 5 mg prior to sleep - Recommended considering restarting antidepressant to help with mood symptoms    The patient understands the plans discussed today and is in agreement with them.  She  knows to contact our office if she develops concerns prior to her next appointment.  60 minutes of total time was spent for this patient encounter, including preparation,review of records,  face-to-face counseling with the patient and coordination of care, physical exam, and documentation of the encounter.   Mickiel Dry, MD  [facility] [place] - A DEPT OF [name] [address] [place] [address] Dept: [phone] Dept Fax: [phone]   No orders of the defined types were placed in this encounter.    ONCOLOGY HISTORY:   I have reviewed her chart and materials related to her cancer extensively and collaborated history with the patient. Summary of oncologic history is as follows:  CML-Chronic phase:  -11/20/2015: Presented for evaluation with a WBC of 170000 with labsts, promyelocytes, myelocytes and NRBCs. -11/21/2015: BCR P210 Ratio of Fusion to Control, Blood 0.8941 P210 IS % Ratio, Blood 85.834%  [facility]) -01/02/2016-10/23/2016 : Nilotinib  BID -03/30/2016: BCR ABL PCR: Positive for both the b2a2(0.317) and b3a2 (0.194) p210 fusion gene transcripts found in chronic myelogenous leukemia.  -10/23/2016-11/05/2016: Hospital admission for large left pleural effusion and pericardial effusion -12/06/2016-03/07/2024: Nilotinib  300mg  every 12 hours(Restarted).  -07/03/2018- Current: BCR ABL: Negative  Current Treatment:  Surveillance  INTERVAL HISTORY:   Andrea Martinez is here today for follow up. Patient is accompanied by her husband today .   She is very concerned about her recent diagnosis of rheumatoid arthritis and lupus.She experiences swelling and pain, particularly in her thumb, which has been numb since a previous surgery. She is currently taking Tylenol   for pain but is hesitant to take other medications due to her chemotherapy history.  She has a history of leukemia and has been on and off chemotherapy. She  resumed chemotherapy at the end of August. She experiences crying at night and difficulty sleeping, often waking up at 1:30 AM. She is interested in trying melatonin to help with sleep.  She experiences significant sweating, especially when exerting herself, such as pushing a shopping cart. She also reports gaining weight, which she attributes to a medication for anxiety and depression prescribed by a nurse practitioner. She is concerned about the weight gain and does not feel the medication is helping her.  She has a history of two knee replacements, two hip replacements, and two major back surgeries over the past ten years. No shortness of breath, abdominal pain, or diarrhea.   I have reviewed the past medical history, past surgical history, social history and family history with the patient and they are unchanged from previous note.  ALLERGIES:  is allergic to nucynta [tapentadol hcl], codeine, sulfonamide derivatives, nexium [esomeprazole magnesium ], sulfa antibiotics, and fluconazole.  MEDICATIONS:  Current Outpatient Medications  Medication Sig Dispense Refill   Acetaminophen  (ACETAMIN PO) Take by mouth as needed.     cetirizine (ZYRTEC) 10 MG tablet Take 10 mg by mouth daily.     cholecalciferol  (VITAMIN D3) 25 MCG (1000 UNIT) tablet Take 4,000 Units by mouth daily. (Patient not taking: Reported on 07/16/2024)     desvenlafaxine  (PRISTIQ ) 25 MG 24 hr tablet TAKE 1 TABLET (25 MG TOTAL) BY MOUTH DAILY. 90 tablet 0   estradiol  (ESTRACE ) 0.1 MG/GM vaginal cream Discard plastic applicator. Insert a blueberry size amount (approximately 1 gram) of cream on fingertip inside vagina at bedtime every night for 1 week then every other night. For long term use. 30 g 3   lactose free nutrition (BOOST) LIQD Take 237 mLs by mouth 3 (three) times daily between meals.     levothyroxine  (SYNTHROID ) 75 MCG tablet TAKE 1 TABLET BY MOUTH DAILY BEFORE BREAKFAST. 90 tablet 1   mirabegron  ER (MYRBETRIQ ) 25 MG TB24  tablet Take 1 tablet (25 mg total) by mouth daily. 30 tablet 11   Multiple Vitamins-Minerals (MULTIVITAMIN WOMEN PO) Take 1 tablet by mouth daily.     nilotinib  (TASIGNA ) 150 MG capsule Take 2 capsules (300 mg total) by mouth every 12 (twelve) hours. 120 capsule 2   ondansetron  (ZOFRAN ) 4 MG tablet Take 1 tablet (4 mg total) by mouth every 8 (eight) hours as needed for nausea or vomiting. 60 tablet 3   Probiotic Product (DIGESTIVE ADV PREBIOT+PROBIOT) CHEW Chew 2 each by mouth daily.     rosuvastatin  (CRESTOR ) 5 MG tablet Take 1 tablet (5 mg total) by mouth at bedtime. 90 tablet 1   No current facility-administered medications for this visit.    REVIEW OF SYSTEMS:   Constitutional: Denies fevers, chills or abnormal weight loss Eyes: Denies blurriness of vision Ears, nose, mouth, throat, and face: Denies mucositis or sore throat Respiratory: Denies cough, dyspnea or wheezes Cardiovascular: Denies palpitation, chest discomfort or lower extremity swelling Gastrointestinal:  Denies nausea, heartburn or change in bowel habits Skin: Denies abnormal skin rashes Lymphatics: Denies new lymphadenopathy or easy bruising Neurological:Denies numbness, tingling or new weaknesses Behavioral/Psych: Mood is stable, no new changes  All other systems were reviewed with the patient and are negative.   VITALS:  There were no vitals taken for this visit.  Wt Readings from Last 3 Encounters:  06/25/24 216  lb 6.4 oz (98.2 kg)  06/22/24 214 lb 12.8 oz (97.4 kg)  05/23/24 216 lb (98 kg)    There is no height or weight on file to calculate BMI.  Performance status (ECOG): 1 - Symptomatic but completely ambulatory  PHYSICAL EXAM:   GENERAL:alert, no distress and comfortable SKIN: skin color, texture, turgor are normal, no rashes or significant lesions EYES: normal, Conjunctiva are pink and non-injected, sclera clear LYMPH:  no palpable lymphadenopathy in the cervical, axillary or inguinal LUNGS: clear  to auscultation and percussion with normal breathing effort HEART: regular rate & rhythm and no murmurs and no lower extremity edema ABDOMEN:abdomen soft, non-tender and normal bowel sounds Musculoskeletal:no cyanosis of digits and no clubbing  NEURO: alert & oriented x 3 with fluent speech, no focal motor/sensory deficits  LABORATORY DATA:  I have reviewed the data as listed   Lab Results  Component Value Date   WBC 14.4 (H) 08/01/2024   NEUTROABS 10.7 (H) 08/01/2024   HGB 12.4 08/01/2024   HCT 39.0 08/01/2024   MCV 89.9 08/01/2024   PLT 268 08/01/2024      Chemistry      Component Value Date/Time   NA 136 06/25/2024 1043   NA 140 06/22/2024 1129   K 4.6 06/25/2024 1043   CL 104 06/25/2024 1043   CO2 22 06/25/2024 1043   BUN 46 (H) 06/25/2024 1043   BUN 34 (H) 06/22/2024 1129   CREATININE 1.09 (H) 06/25/2024 1043   CREATININE 0.87 07/14/2011 0938      Component Value Date/Time   CALCIUM  9.5 06/25/2024 1043   ALKPHOS 142 (H) 06/25/2024 1043   AST 27 06/25/2024 1043   ALT 18 06/25/2024 1043   BILITOT 0.8 06/25/2024 1043   BILITOT 0.3 06/22/2024 1129        Latest Reference Range & Units 08/01/24 12:54  b2a2 transcript % <0.0032  b3a2 transcript % <0.0032  E1A2 Transcript % <0.0032  Interpretation (BCRAL):  Negative  Director Review (BCRAL):  Comment  References (BCRAL)  Comment  BCR-ABL1, CML/ALL, PCR, QUANT  Rpt (C)  (C): Corrected Rpt: View report in Results Review for more information  RADIOGRAPHIC STUDIES: I have personally reviewed the radiological images as listed and agreed with the findings in the report.  DG Hand Complete Right CLINICAL DATA:  Bilateral hand pain and swelling.  EXAM: RIGHT HAND - COMPLETE 3+ VIEW  COMPARISON:  None Available.  FINDINGS: There is no evidence of fracture or dislocation. Mild degenerative changes seen involving the first carpometacarpal joint. Soft tissues are unremarkable.  IMPRESSION: Mild osteoarthritis  of first carpometacarpal joint. No acute abnormality seen.  Electronically Signed   By: Lynwood Landy Raddle M.D.   On: 06/25/2024 12:55 DG Hand Complete Left CLINICAL DATA:  Bilateral hand pain and swelling.  EXAM: LEFT HAND - COMPLETE 3+ VIEW  COMPARISON:  None Available.  FINDINGS: There is no evidence of fracture or dislocation. Moderate degenerative changes seen involving first carpometacarpal joint. Soft tissues are unremarkable.  IMPRESSION: Moderate osteoarthritis of first carpometacarpal joint. No acute abnormality seen.  Electronically Signed   By: Lynwood Landy Raddle M.D.   On: 06/25/2024 12:54

## 2024-08-15 ENCOUNTER — Other Ambulatory Visit: Payer: Self-pay | Admitting: *Deleted

## 2024-08-15 ENCOUNTER — Inpatient Hospital Stay: Attending: Oncology | Admitting: Oncology

## 2024-08-15 VITALS — BP 134/70 | HR 83 | Temp 96.8°F | Resp 20 | Wt 214.7 lb

## 2024-08-15 DIAGNOSIS — M180 Bilateral primary osteoarthritis of first carpometacarpal joints: Secondary | ICD-10-CM | POA: Diagnosis not present

## 2024-08-15 DIAGNOSIS — G4700 Insomnia, unspecified: Secondary | ICD-10-CM | POA: Diagnosis not present

## 2024-08-15 DIAGNOSIS — Z9221 Personal history of antineoplastic chemotherapy: Secondary | ICD-10-CM | POA: Diagnosis not present

## 2024-08-15 DIAGNOSIS — C9210 Chronic myeloid leukemia, BCR/ABL-positive, not having achieved remission: Secondary | ICD-10-CM

## 2024-08-15 DIAGNOSIS — C9211 Chronic myeloid leukemia, BCR/ABL-positive, in remission: Secondary | ICD-10-CM | POA: Insufficient documentation

## 2024-08-15 DIAGNOSIS — N189 Chronic kidney disease, unspecified: Secondary | ICD-10-CM | POA: Insufficient documentation

## 2024-08-15 DIAGNOSIS — E0590 Thyrotoxicosis, unspecified without thyrotoxic crisis or storm: Secondary | ICD-10-CM | POA: Insufficient documentation

## 2024-08-15 DIAGNOSIS — R768 Other specified abnormal immunological findings in serum: Secondary | ICD-10-CM | POA: Diagnosis not present

## 2024-08-15 DIAGNOSIS — R760 Raised antibody titer: Secondary | ICD-10-CM | POA: Diagnosis not present

## 2024-08-15 MED ORDER — TRAMADOL HCL 50 MG PO TABS
50.0000 mg | ORAL_TABLET | Freq: Every evening | ORAL | 0 refills | Status: DC | PRN
Start: 2024-08-15 — End: 2024-09-11

## 2024-08-15 NOTE — Patient Instructions (Addendum)
 [facility] at [facility] Discharge Instructions   You were seen and examined today by Dr. Davonna.  She reviewed the results of your lab work which are normal/stable. Your thyroid  is overactive. Follow up with Dr. Lenis as scheduled.   We will see you back in 6 weeks. We will repeat lab work prior to this visit.   Return as scheduled.    Thank you for choosing [facility] at [facility] to provide your oncology and hematology care.  To afford each patient quality time with our provider, please arrive at least 15 minutes before your scheduled appointment time.   If you have a lab appointment with the Cancer Center please come in thru the Main Entrance and check in at the main information desk.  You need to re-schedule your appointment should you arrive 10 or more minutes late.  We strive to give you quality time with our providers, and arriving late affects you and other patients whose appointments are after yours.  Also, if you no show three or more times for appointments you may be dismissed from the clinic at the providers discretion.     Again, thank you for choosing [facility].  Our hope is that these requests will decrease the amount of time that you wait before being seen by our physicians.       _____________________________________________________________  Should you have questions after your visit to [facility], please contact our office at [phone] and follow the prompts.  Our office hours are 8:00 a.m. and 4:30 p.m. Monday - Friday.  Please note that voicemails left after 4:00 p.m. may not be returned until the following business day.  We are closed weekends and major holidays.  You do have access to a nurse 24-7, just call the main number to the clinic [phone] and do not press any options, hold on the line and a nurse will answer the phone.    For prescription refill requests, have your pharmacy  contact our office and allow 72 hours.    Due to Covid, you will need to wear a mask upon entering the hospital. If you do not have a mask, a mask will be given to you at the Main Entrance upon arrival. For doctor visits, patients may have 1 support person age 79 or older with them. For treatment visits, patients can not have anyone with them due to social distancing guidelines and our immunocompromised population.

## 2024-08-15 NOTE — Assessment & Plan Note (Signed)
 Patient has elevated ANA with a ratio of 1:640 She is symptomatic with joint pains and unable to bend her fingers X-rays of hand consistent with osteoarthritis findings  - Patient cannot take NSAIDs because of chronic kidney disease - Pain is not controlled with Tylenol  - Will prescribe tramadol  to be taken twice daily as needed -Patient has an upcoming rheumatology appointment

## 2024-08-15 NOTE — Assessment & Plan Note (Addendum)
 Patient reports severe insomnia and crying at night Reported that she is not taking antidepressants prescribed because of concerns about weight gain and them not helping  - Recommend starting melatonin 5 mg prior to sleep - Recommended considering restarting antidepressant to help with mood symptoms

## 2024-08-15 NOTE — Progress Notes (Signed)
Patient is taking Tasigna as prescribed.  She has not missed any doses and reports no side effects at this time.

## 2024-08-15 NOTE — Assessment & Plan Note (Addendum)
 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 Was on nilotinib  all this years and was discontinued in June 2025. Patient took it for few days the day before August as she was symptomatic.  - Recommended patient to hold nilotinib  at this time as patient has been in remission for several years  Will repeat BCR-ABL in 6 weeks and follow-up at that time

## 2024-08-23 DIAGNOSIS — E039 Hypothyroidism, unspecified: Secondary | ICD-10-CM | POA: Diagnosis not present

## 2024-08-23 DIAGNOSIS — E782 Mixed hyperlipidemia: Secondary | ICD-10-CM | POA: Diagnosis not present

## 2024-08-24 LAB — LIPID PANEL
Chol/HDL Ratio: 3.7 ratio (ref 0.0–4.4)
Cholesterol, Total: 214 mg/dL — ABNORMAL HIGH (ref 100–199)
HDL: 58 mg/dL (ref >39)
LDL Chol Calc (NIH): 125 mg/dL — ABNORMAL HIGH (ref 0–99)
Triglycerides: 176 mg/dL — ABNORMAL HIGH (ref 0–149)
VLDL Cholesterol Cal: 31 mg/dL (ref 5–40)

## 2024-08-24 LAB — TSH: TSH: 0.297 u[IU]/mL — ABNORMAL LOW (ref 0.450–4.500)

## 2024-08-24 LAB — VITAMIN B12: Vitamin B-12: 1258 pg/mL — ABNORMAL HIGH (ref 232–1245)

## 2024-08-24 LAB — T4, FREE: Free T4: 1.22 ng/dL (ref 0.82–1.77)

## 2024-08-24 LAB — VITAMIN D 25 HYDROXY (VIT D DEFICIENCY, FRACTURES): Vit D, 25-Hydroxy: 28.7 ng/mL — ABNORMAL LOW (ref 30.0–100.0)

## 2024-08-27 ENCOUNTER — Ambulatory Visit: Admitting: Urology

## 2024-08-29 ENCOUNTER — Other Ambulatory Visit: Payer: Self-pay | Admitting: "Endocrinology

## 2024-09-03 ENCOUNTER — Encounter: Payer: Self-pay | Admitting: "Endocrinology

## 2024-09-03 ENCOUNTER — Ambulatory Visit: Admitting: "Endocrinology

## 2024-09-03 VITALS — BP 130/66 | HR 84 | Ht 65.0 in | Wt 218.0 lb

## 2024-09-03 DIAGNOSIS — E782 Mixed hyperlipidemia: Secondary | ICD-10-CM

## 2024-09-03 DIAGNOSIS — E559 Vitamin D deficiency, unspecified: Secondary | ICD-10-CM

## 2024-09-03 DIAGNOSIS — E039 Hypothyroidism, unspecified: Secondary | ICD-10-CM | POA: Diagnosis not present

## 2024-09-03 MED ORDER — ROSUVASTATIN CALCIUM 5 MG PO TABS: 5.0000 mg | ORAL_TABLET | Freq: Every evening | ORAL | 1 refills | Status: AC

## 2024-09-03 NOTE — Progress Notes (Signed)
 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 Mirna   Lipoma    left upper arm   Mixed hyperlipidemia    Osteoporosis    Pericardial effusion    a. HCAP complicated by pericardial effusion requiring pericardial window 10/2016 and large L pleural effusion requring VATS.   Pleural effusion    a. s/p VATS 2017.   Pneumonia 10/2016   PONV (postoperative nausea and vomiting)    history of   Prolonged QT interval    Urine incontinence    UTI (lower urinary tract infection)    frequent   Vitamin D  deficiency    Past Surgical History:  Procedure Laterality Date   BIOPSY  03/20/2022   Procedure: BIOPSY;  Surgeon: Cindie Carlin POUR, DO;  Location: [facility];  Service: Endoscopy;;  gastric lesion vs. gastric ulcer   BIOPSY  06/28/2022   Procedure: BIOPSY;  Surgeon: Cindie Carlin POUR, DO;  Location: [facility];  Service: Endoscopy;;   BIOPSY  05/10/2023   Procedure: BIOPSY;  Surgeon: Cindie Carlin POUR, DO;  [facility]: [facility];  Service: Endoscopy;;   BRAVO PH STUDY  12/04/2012   Procedure: BRAVO PH STUDY;  Surgeon: Margo LITTIE Haddock, MD;  Location: [facility];  Service: Endoscopy;  Laterality: N/A;   CHOLECYSTECTOMY N/A 01/03/2014   Procedure: LAPAROSCOPIC CHOLECYSTECTOMY WITH INTRAOPERATIVE CHOLANGIOGRAM;  Surgeon: Debby LABOR. Cornett, MD;  Location: [facility];  Service: General;  Laterality: N/A;   COLONOSCOPY  01/2004   DR [facility], few small tics   COLONOSCOPY WITH PROPOFOL  N/A 05/10/2023   Procedure: COLONOSCOPY WITH PROPOFOL ;  Surgeon: Cindie Carlin POUR, DO;  [facility]: [facility];   Service: Endoscopy;  Laterality: N/A;  1245pm, asa 3   ESOPHAGOGASTRODUODENOSCOPY  02/2010   gastric ulcers   ESOPHAGOGASTRODUODENOSCOPY  12/04/2012   DOQ:[id number] gastritis (inflammation) was found in the gastric antrum; multiple biopsies The duodenal mucosa showed no abnormalities in the bulb and second portion of the duodenum   ESOPHAGOGASTRODUODENOSCOPY (EGD) WITH PROPOFOL  N/A 03/20/2022   Procedure: ESOPHAGOGASTRODUODENOSCOPY (EGD) WITH PROPOFOL ;  Surgeon: Cindie Carlin POUR, DO;  [facility];  Service: Endoscopy;  Laterality: N/A;   ESOPHAGOGASTRODUODENOSCOPY (EGD) WITH PROPOFOL  N/A 06/28/2022   Procedure: ESOPHAGOGASTRODUODENOSCOPY (EGD) WITH PROPOFOL ;  Surgeon: Cindie Carlin POUR, DO;  [facility];  Service: Endoscopy;  Laterality: N/A;  800   ESOPHAGOGASTRODUODENOSCOPY (EGD) WITH PROPOFOL  N/A 05/10/2023   Procedure: ESOPHAGOGASTRODUODENOSCOPY (EGD) WITH PROPOFOL ;  Surgeon: Cindie Carlin POUR, DO;  [facility];  Service: Endoscopy;  Laterality: N/A;   JOINT REPLACEMENT Right 10/22/2019   KYPHOPLASTY N/A 02/14/2019   Procedure: KYPHOPLASTY T12 and L1;  Surgeon: Burnetta Aures, MD;  Location: [facility];  Service: Orthopedics;  Laterality: N/A;  120 mins   LASIK     LIPOMA EXCISION  08/02/2011   left shoulder   NOSE SURGERY     PARTIAL HYSTERECTOMY     vaginal at age 48 years of age   POLYPECTOMY  05/10/2023   Procedure: POLYPECTOMY;  Surgeon: Cindie Carlin POUR, DO;  [facility]: [facility];  Service: Endoscopy;;   TOE DEBRIDEMENT Left 1962   lt great toe   TOTAL HIP ARTHROPLASTY Right 11/18/2021   Procedure: TOTAL HIP ARTHROPLASTY ANTERIOR APPROACH;  Surgeon: Melodi Lerner, MD;  Location: [facility];  Service: Orthopedics;  Laterality: Right;   TOTAL HIP ARTHROPLASTY Left 06/29/2023   Procedure: LEFT TOTAL HIP ARTHROPLASTY ANTERIOR APPROACH;  Surgeon: Melodi Lerner, MD;  Location: [facility];  Service: Orthopedics;  Laterality: Left;   TOTAL KNEE ARTHROPLASTY  Right 10/22/2019   Procedure: TOTAL KNEE ARTHROPLASTY;  Surgeon: Melodi Lerner, MD;  Location: [facility];  Service: Orthopedics;  Laterality: Right;    TOTAL KNEE ARTHROPLASTY Left 03/03/2020   Procedure: TOTAL KNEE ARTHROPLASTY;  Surgeon: Melodi Lerner, MD;  Location: [facility];  Service: Orthopedics;  Laterality: Left;    TRANSFORAMINAL LUMBAR INTERBODY FUSION (TLIF) WITH PEDICLE SCREW FIXATION 1 LEVEL N/A 01/28/2021   Procedure: TRANSFORAMINAL LUMBAR INTERBODY FUSION (TLIF) LUMBAR FIVE-SACRAL ONE;  Surgeon: Burnetta Aures, MD;  Location: [facility];  Service: Orthopedics;  Laterality: N/A;  4 hrs   VIDEO ASSISTED THORACOSCOPY (VATS)/EMPYEMA Left 10/25/2016   Procedure: VIDEO ASSISTED THORACOSCOPY (VATS), BRONCH,DRAINAGE OF PLEURAL EFFUSION,PERICARDIAL WINDOW WITH DRAINAGE OF PERICARDIAL FLUID, TEE;  Surgeon: Elspeth JAYSON Millers, MD;  Location: [facility];  Service: Thoracic;  Laterality: Left;   VIDEO BRONCHOSCOPY N/A 10/25/2016   Procedure: VIDEO BRONCHOSCOPY;  Surgeon: Elspeth JAYSON Millers, MD;  Location: [facility];  Service: Thoracic;  Laterality: N/A;   Social History   Socioeconomic History   Marital status: Married    Spouse name: Zachary   Number of children: 2   Years of education: 12th grade   Highest education level: Not on file  Occupational History   Occupation: retired    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   2 granddaughters.   Retired HR.    Social Drivers of Corporate investment banker Strain: Low Risk  (05/23/2024)   Overall Financial Resource Strain (CARDIA)     Difficulty of Paying Living Expenses: Not hard at all  Food Insecurity: No Food Insecurity (05/23/2024)   Hunger Vital Sign    Worried About Running Out of Food in the Last Year: Never true    Ran Out of Food in the Last Year: Never true  Transportation Needs: No Transportation Needs (05/23/2024)   PRAPARE - Administrator, Civil Service (Medical): No    Lack of Transportation (Non-Medical): No  Physical Activity: Sufficiently Active (05/23/2024)   Exercise Vital Sign    Days of Exercise per Week: 7 days    Minutes of Exercise per Session: 30 min  Stress: Stress Concern Present (05/23/2024)   [employer] of Occupational Health - Occupational Stress Questionnaire    Feeling of Stress : Very much  Social Connections: Moderately Isolated (05/23/2024)   Social Connection and Isolation Panel    Frequency of Communication with Friends and Family: More than three times a  week    Frequency of Social Gatherings with Friends and Family: More than three times a week    Attends Religious Services: Never    Database administrator or Organizations: No    Attends Banker Meetings: Never    Marital Status: Married   Outpatient Encounter Medications as of 09/03/2024  Medication Sig   Acetaminophen  (ACETAMIN PO) Take by mouth as needed.   cetirizine (ZYRTEC) 10 MG tablet Take 10 mg by mouth daily.   cholecalciferol  (VITAMIN D3) 25 MCG (1000 UNIT) tablet Take 4,000 Units by mouth daily. (Patient not taking: Take 4,000 Units by mouth daily.)   desvenlafaxine  (PRISTIQ ) 25 MG 24 hr tablet TAKE 1 TABLET (25 MG TOTAL) BY MOUTH DAILY.   estradiol  (ESTRACE ) 0.1 MG/GM vaginal cream Discard plastic applicator. Insert a blueberry size amount (approximately 1 gram) of cream on fingertip inside vagina at bedtime every night for 1 week then every other night. For long term use.   hydroxyurea (HYDREA) 500 MG capsule  (Patient not taking: Reported on 09/03/2024)   lactose free nutrition  (BOOST) LIQD Take 237 mLs by mouth 3 (three) times daily between meals.   levothyroxine  (SYNTHROID ) 75 MCG tablet TAKE 1 TABLET BY MOUTH DAILY BEFORE BREAKFAST.   mirabegron  ER (MYRBETRIQ ) 25 MG TB24 tablet Take 1 tablet (25 mg total) by mouth daily.   Multiple Vitamins-Minerals (MULTIVITAMIN WOMEN PO) Take 1 tablet by mouth daily.   nilotinib  (TASIGNA ) 150 MG capsule Take 2 capsules (300 mg total) by mouth every 12 (twelve) hours. (Patient not taking: Reported on 09/03/2024)   ondansetron  (ZOFRAN ) 4 MG tablet Take 1 tablet (4 mg total) by mouth every 8 (eight) hours as needed for nausea or vomiting.   Probiotic Product (DIGESTIVE ADV PREBIOT+PROBIOT) CHEW Chew 2 each by mouth daily.   rosuvastatin  (CRESTOR ) 5 MG tablet Take 1 tablet (5 mg total) by mouth at bedtime.   traMADol  (ULTRAM ) 50 MG tablet Take 1 tablet (50 mg total) by mouth at bedtime as needed.   [DISCONTINUED] rosuvastatin  (CRESTOR ) 5 MG tablet TAKE 1 TABLET BY MOUTH EVERYDAY AT BEDTIME (Patient not taking: Reported on 09/03/2024)   No facility-administered encounter medications on file as of 09/03/2024.   ALLERGIES: Allergies  Allergen Reactions   Nucynta [Tapentadol Hcl] Swelling   Codeine Nausea Only and Other (See Comments)    Headache   Sulfonamide Derivatives Nausea Only and Other (See Comments)    Headache   Nexium [Esomeprazole Magnesium ] Nausea Only   Sulfa Antibiotics Nausea Only    Headache    Fluconazole Rash    Redness and blistering on left thigh    VACCINATION STATUS: Immunization History  Administered Date(s) Administered   Fluad Quad(high Dose 65+) 09/11/2020, 09/24/2021   Influenza,inj,Quad PF,6+ Mos 09/13/2017   Influenza-Unspecified 09/24/2015, 11/24/2017, 10/04/2023   Moderna Sars-Covid-2 Vaccination 04/02/2020, 04/30/2020, 01/07/2021   Pneumococcal Conjugate-13 10/09/2014   Pneumococcal Polysaccharide-23 10/09/2014    HPI  78 year old female patient with medical history as above.   She is  being seen in follow-up for hypothyroidism.  She is currently on levothyroxine  75 mcg p.o. daily before breakfast.  She reports consistency and compliance with her medication.  Her previsit labs are consistent with improved profile.  She has loss of control of dyslipidemia daily due to her inconsistency taking her Crestor .    She denies chest pain, shortness of breath.  She reports fatigue.  She also stopped taking her vitamin D . She denies palpitations, tremors, nor heat intolerance.  She is recently taken off of  nilotinib  which she has taken for several years for  CML.    -She has family history of thyroid  problem involving goiter which required thyroidectomy in her mother who took thyroid  hormone replacement subsequently. -She denies dysphagia, shortness of breath, voice change.  She could not engage with the lifestyle medicine nutrition discussed during her last visit.    Review of Systems Limited as above.  Objective:    BP 130/66   Pulse 84   Ht 5' 5 (1.651 m)   Wt 218 lb (98.9 kg)   BMI 36.28 kg/m   Wt Readings from Last 3 Encounters:  09/03/24 218 lb (98.9 kg)  08/15/24 214 lb 11.7 oz (97.4 kg)  06/25/24 216 lb 6.4 oz (98.2 kg)     CMP     Component Value Date/Time   NA 136 06/25/2024 1043   NA 140 06/22/2024 1129   K 4.6 06/25/2024 1043   CL 104 06/25/2024 1043   CO2 22 06/25/2024 1043   GLUCOSE 107 (H) 06/25/2024 1043   BUN 46 (H) 06/25/2024 1043   BUN 34 (H) 06/22/2024 1129   CREATININE 1.09 (H) 06/25/2024 1043   CREATININE 0.87 07/14/2011 0938   CALCIUM  9.5 06/25/2024 1043   PROT 8.1 06/25/2024 1043   PROT 7.7 06/22/2024 1129   ALBUMIN 4.1 06/25/2024 1043   ALBUMIN 4.7 06/22/2024 1129   AST 27 06/25/2024 1043   ALT 18 06/25/2024 1043   ALKPHOS 142 (H) 06/25/2024 1043   BILITOT 0.8 06/25/2024 1043   BILITOT 0.3 06/22/2024 1129   GFRNONAA 52 (L) 06/25/2024 1043   GFRAA >60 08/21/2020 1340   Diabetic Labs (most recent): Lab Results  Component Value  Date   HGBA1C 5.2 06/22/2023   HGBA1C 5.1 10/30/2021   HGBA1C 5.3 04/24/2019   Lipid Panel     Component Value Date/Time   CHOL 214 (H) 08/23/2024 0945   TRIG 176 (H) 08/23/2024 0945   HDL 58 08/23/2024 0945   CHOLHDL 3.7 08/23/2024 0945   LDLCALC 125 (H) 08/23/2024 0945   LABVLDL 31 08/23/2024 0945    Latest Reference Range & Units 02/02/23 13:48  TSH 0.450 - 4.500 uIU/mL 0.819  T4,Free(Direct) 0.82 - 1.77 ng/dL 8.70    Assessment & Plan:   1. Hypothyroidism  2.  Hyperlipidemia - Her thyroid  function tests are consistent with appropriate replacement.  She is advised to continue levothyroxine  75 mcg p.o. daily before breakfast.    - We discussed about the correct intake of her thyroid  hormone, on empty stomach at fasting, with water , separated by at least 30 minutes from breakfast and other medications,  and separated by more than 4 hours from calcium , iron, multivitamins, acid reflux medications (PPIs). -Patient is made aware of the fact that thyroid  hormone replacement is needed for life, dose to be adjusted by periodic monitoring of thyroid  function tests.  - Her clinical exam has been  negative for goiter, hence no need for imaging of the thyroid  at this time. Regarding her hyperlipidemia: She did not stay on her Crestor  consistently.  Her LDL is 125, increasing from 96.  She is willing to return to her Crestor  p.o. nightly.    Side effects and precautions discussed with her.    Her labs also show vitamin D  deficiency, has not been consistent with her vitamin D  supplements.  Advised to resume and continue vitamin D3 4000 units daily, take her Centrum multivitamin every other day to lower her vitamin  B12 which is supraphysiologic at this time.  - I advised patient to maintain close follow up with Rakes, Rock HERO, FNP for primary care needs.  I spent  25  minutes in the care of the patient today including review of labs from Thyroid  Function, CMP, and other relevant labs ;  imaging/biopsy records (current and previous including abstractions from other facilities); face-to-face time discussing  her lab results and symptoms, medications doses, her options of short and long term treatment based on the latest standards of care / guidelines;   and documenting the encounter.  Andrea Martinez  participated in the discussions, expressed understanding, and voiced agreement with the above plans.  All questions were answered to her satisfaction. she is encouraged to contact clinic should she have any questions or concerns prior to her return visit.   Follow up plan: Return in about 6 months (around 03/03/2025) for Fasting Labs  in AM B4 8.  Ranny Earl, MD Phone: [phone]  Fax: [phone]  -  This note was partially dictated with voice recognition software. Similar sounding words can be transcribed inadequately or may not  be corrected upon review.  09/03/2024, 2:11 PM

## 2024-09-06 ENCOUNTER — Inpatient Hospital Stay

## 2024-09-06 DIAGNOSIS — C9210 Chronic myeloid leukemia, BCR/ABL-positive, not having achieved remission: Secondary | ICD-10-CM

## 2024-09-06 DIAGNOSIS — C9211 Chronic myeloid leukemia, BCR/ABL-positive, in remission: Secondary | ICD-10-CM | POA: Diagnosis not present

## 2024-09-06 LAB — LACTATE DEHYDROGENASE: LDH: 158 U/L (ref 98–192)

## 2024-09-06 LAB — COMPREHENSIVE METABOLIC PANEL WITH GFR
ALT: 24 U/L (ref 0–44)
AST: 32 U/L (ref 15–41)
Albumin: 3.9 g/dL (ref 3.5–5.0)
Alkaline Phosphatase: 137 U/L — ABNORMAL HIGH (ref 38–126)
Anion gap: 11 (ref 5–15)
BUN: 38 mg/dL — ABNORMAL HIGH (ref 8–23)
CO2: 25 mmol/L (ref 22–32)
Calcium: 9.3 mg/dL (ref 8.9–10.3)
Chloride: 104 mmol/L (ref 98–111)
Creatinine, Ser: 1.21 mg/dL — ABNORMAL HIGH (ref 0.44–1.00)
GFR, Estimated: 46 mL/min — ABNORMAL LOW (ref >60)
Glucose, Bld: 113 mg/dL — ABNORMAL HIGH (ref 70–99)
Potassium: 4.7 mmol/L (ref 3.5–5.1)
Sodium: 140 mmol/L (ref 135–145)
Total Bilirubin: 0.5 mg/dL (ref 0.0–1.2)
Total Protein: 7.5 g/dL (ref 6.5–8.1)

## 2024-09-06 LAB — CBC WITH DIFFERENTIAL/PLATELET
Abs Immature Granulocytes: 0.06 K/uL (ref 0.00–0.07)
Basophils Absolute: 0 K/uL (ref 0.0–0.1)
Basophils Relative: 0 %
Eosinophils Absolute: 0.2 K/uL (ref 0.0–0.5)
Eosinophils Relative: 2 %
HCT: 37.4 % (ref 36.0–46.0)
Hemoglobin: 11.7 g/dL — ABNORMAL LOW (ref 12.0–15.0)
Immature Granulocytes: 1 %
Lymphocytes Relative: 13 %
Lymphs Abs: 1.4 K/uL (ref 0.7–4.0)
MCH: 28.2 pg (ref 26.0–34.0)
MCHC: 31.3 g/dL (ref 30.0–36.0)
MCV: 90.1 fL (ref 80.0–100.0)
Monocytes Absolute: 0.8 K/uL (ref 0.1–1.0)
Monocytes Relative: 7 %
Neutro Abs: 8.2 K/uL — ABNORMAL HIGH (ref 1.7–7.7)
Neutrophils Relative %: 77 %
Platelets: 282 K/uL (ref 150–400)
RBC: 4.15 MIL/uL (ref 3.87–5.11)
RDW: 13.2 % (ref 11.5–15.5)
WBC: 10.6 K/uL — ABNORMAL HIGH (ref 4.0–10.5)
nRBC: 0 % (ref 0.0–0.2)

## 2024-09-08 ENCOUNTER — Other Ambulatory Visit: Payer: Self-pay | Admitting: Family Medicine

## 2024-09-08 DIAGNOSIS — F320 Major depressive disorder, single episode, mild: Secondary | ICD-10-CM

## 2024-09-08 DIAGNOSIS — F411 Generalized anxiety disorder: Secondary | ICD-10-CM

## 2024-09-11 ENCOUNTER — Other Ambulatory Visit: Payer: Self-pay | Admitting: Oncology

## 2024-09-12 ENCOUNTER — Inpatient Hospital Stay

## 2024-09-12 LAB — BCR-ABL1, CML/ALL, PCR, QUANT
E1A2 Transcript: <0.0032 %
Interpretation (BCRAL):: NEGATIVE
b2a2 transcript: <0.0032 %
b3a2 transcript: <0.0032 %

## 2024-09-13 ENCOUNTER — Inpatient Hospital Stay

## 2024-09-14 ENCOUNTER — Ambulatory Visit: Admitting: Nurse Practitioner

## 2024-09-17 DIAGNOSIS — H43812 Vitreous degeneration, left eye: Secondary | ICD-10-CM | POA: Diagnosis not present

## 2024-09-17 DIAGNOSIS — H353131 Nonexudative age-related macular degeneration, bilateral, early dry stage: Secondary | ICD-10-CM | POA: Diagnosis not present

## 2024-09-17 DIAGNOSIS — H25813 Combined forms of age-related cataract, bilateral: Secondary | ICD-10-CM | POA: Diagnosis not present

## 2024-09-19 ENCOUNTER — Ambulatory Visit: Admitting: Nurse Practitioner

## 2024-09-21 DIAGNOSIS — H43813 Vitreous degeneration, bilateral: Secondary | ICD-10-CM | POA: Diagnosis not present

## 2024-09-21 DIAGNOSIS — H35373 Puckering of macula, bilateral: Secondary | ICD-10-CM | POA: Diagnosis not present

## 2024-09-21 DIAGNOSIS — H353122 Nonexudative age-related macular degeneration, left eye, intermediate dry stage: Secondary | ICD-10-CM | POA: Diagnosis not present

## 2024-09-21 DIAGNOSIS — H353211 Exudative age-related macular degeneration, right eye, with active choroidal neovascularization: Secondary | ICD-10-CM | POA: Diagnosis not present

## 2024-09-21 DIAGNOSIS — H2513 Age-related nuclear cataract, bilateral: Secondary | ICD-10-CM | POA: Diagnosis not present

## 2024-09-27 ENCOUNTER — Inpatient Hospital Stay

## 2024-09-27 ENCOUNTER — Inpatient Hospital Stay: Attending: Oncology | Admitting: Oncology

## 2024-09-27 VITALS — BP 134/68 | HR 76 | Temp 97.1°F | Resp 17 | Ht 65.0 in | Wt 216.0 lb

## 2024-09-27 DIAGNOSIS — C9211 Chronic myeloid leukemia, BCR/ABL-positive, in remission: Secondary | ICD-10-CM | POA: Insufficient documentation

## 2024-09-27 DIAGNOSIS — G4700 Insomnia, unspecified: Secondary | ICD-10-CM | POA: Diagnosis not present

## 2024-09-27 DIAGNOSIS — F419 Anxiety disorder, unspecified: Secondary | ICD-10-CM | POA: Insufficient documentation

## 2024-09-27 DIAGNOSIS — C9210 Chronic myeloid leukemia, BCR/ABL-positive, not having achieved remission: Secondary | ICD-10-CM | POA: Diagnosis not present

## 2024-09-27 DIAGNOSIS — N189 Chronic kidney disease, unspecified: Secondary | ICD-10-CM | POA: Insufficient documentation

## 2024-09-27 DIAGNOSIS — R7689 Other specified abnormal immunological findings in serum: Secondary | ICD-10-CM | POA: Diagnosis not present

## 2024-09-27 DIAGNOSIS — E0590 Thyrotoxicosis, unspecified without thyrotoxic crisis or storm: Secondary | ICD-10-CM | POA: Insufficient documentation

## 2024-09-27 MED ORDER — INFLUENZA VAC SPLIT HIGH-DOSE 0.5 ML IM SUSY: 0.5000 mL | PREFILLED_SYRINGE | Freq: Once | INTRAMUSCULAR | Status: DC

## 2024-09-27 NOTE — Progress Notes (Signed)
 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 female was transferred to my care after her prior physician has left.   ASSESSMENT & PLAN:   Assessment & Plan: Andrea Martinez  is a 78 y.o. 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 Was on nilotinib  all these years and was discontinued in June 2025. Patient took it for few days in August as she was symptomatic.   - Recommended patient to hold nilotinib  at this time as patient has been in remission for several years - Will closely monitor BCR ABL.  Recommendations to do monthly BCR-ABL checks for the first 1 year followed by every 3 months. -Repeat labs monthly   Return to clinic in 2 months for follow-up.  Hyperthyroidism Latest TSH: 0.204 Asymptomatic   - Being managed by Dr.Nida (endocrinology)  Elevated ANA level Patient has elevated ANA with a ratio of 1:640 She is symptomatic with joint pains and unable to bend her fingers X-rays of hand consistent with osteoarthritis findings   - Patient cannot take NSAIDs because of chronic kidney disease - Pain is not controlled with Tylenol  - Will prescribe tramadol  to be taken twice daily as needed -Patient has an upcoming rheumatology appointment on Monday  Insomnia Insomnia persists, potentially exacerbated by stress. Current medication for pain at bedtime and melatonin are ineffective. She prefers to avoid hypnotics and reports weight gain from antidepressant.  Encounter for influenza vaccination Influenza vaccination is due. -  Administer flu shot today.  Anxiety Patient was very anxious and tearful in the clinic today.  Reports that she is undergoing stressful situations with her joint pains and also her husband's new metastatic malignancy diagnosis.  -Emotional support provided during this visit. - Patient already taking antidepressants and refused to take anxiolytics at this time.   The patient understands the plans discussed today and is in agreement with them.  She knows to contact our office if she develops concerns prior to her next appointment.  The total time spent in the appointment was 40 minutes for the encounter  with patient, including review of chart and various tests results, discussions about plan of care and coordination of care plan   Mickiel Dry, MD  [facility] [place] - A DEPT OF [name] [address] [place] [address] Dept: [phone] Dept Fax: [phone]   Orders Placed This Encounter  Procedures   CBC with Differential/Platelet    Standing Status:   Future    Expected Date:   10/29/2024    Expiration Date:   01/27/2025   Comprehensive metabolic panel with GFR    Standing Status:   Future    Expected Date:   10/29/2024    Expiration Date:   01/27/2025   BCR-ABL1, CML/ALL, PCR, QUANT    Standing Status:   Future    Expected Date:   10/29/2024    Expiration Date:   01/27/2025   CBC with Differential/Platelet    Standing Status:   Future    Expected Date:   11/19/2024  Expiration Date:   02/17/2025   Comprehensive metabolic panel with GFR    Standing Status:   Future    Expected Date:   11/19/2024    Expiration Date:   02/17/2025   BCR-ABL1, CML/ALL, PCR, QUANT    Standing Status:   Future    Expected Date:   11/19/2024    Expiration Date:   02/17/2025     ONCOLOGY HISTORY:   I have reviewed her chart and materials related to her cancer extensively and collaborated history with the patient. Summary of  oncologic history is as follows:  CML-Chronic phase:  -11/20/2015: Presented for evaluation with a WBC of 170000 with labsts, promyelocytes, myelocytes and NRBCs. -11/21/2015: BCR P210 Ratio of Fusion to Control, Blood 0.8941 P210 IS % Ratio, Blood 85.834%  [facility]) -01/02/2016-10/23/2016 : Nilotinib  BID -03/30/2016: BCR ABL PCR: Positive for both the b2a2(0.317) and b3a2 (0.194) p210 fusion gene transcripts found in chronic myelogenous leukemia.  -10/23/2016-11/05/2016: Hospital admission for large left pleural effusion and pericardial effusion -12/06/2016-03/07/2024: Nilotinib  300mg  every 12 hours(Restarted).  -07/03/2018- Current: BCR ABL: Negative  Current Treatment:  Surveillance  INTERVAL HISTORY:   Discussed the use of AI scribe software for clinical note transcription with the patient, who gave verbal consent to proceed.  History of Present Illness Andrea Martinez is a 78 year old female with chronic myeloid leukemia (CML) who presents for follow-up.  She has chronic myeloid leukemia (CML) and has not been taking nilotinib  since June, except for a few days in August. No fever, chills, or night sweats. She is interested in receiving a flu shot.  She has an upcoming appointment with a rheumatologist on Monday. She reports pain in her shoulder radiating to her back, where she previously had surgery, and numbness in her thumb and fingers.  She is currently taking a 50 mg tramadol  for pain at bedtime, which does not help her relax or sleep. She also takes melatonin, which she finds ineffective. She is on a medication for depression, which she feels is causing weight gain, having gained four pounds in one month.  I have reviewed the past medical history, past surgical history, social history and family history with the patient and they are unchanged from previous note.  ALLERGIES:  is allergic to nucynta [tapentadol hcl], codeine, sulfonamide derivatives, nexium  [esomeprazole magnesium ], sulfa antibiotics, and fluconazole.  MEDICATIONS:  Current Outpatient Medications  Medication Sig Dispense Refill   Acetaminophen  (ACETAMIN PO) Take by mouth as needed.     cetirizine (ZYRTEC) 10 MG tablet Take 10 mg by mouth daily.     cholecalciferol  (VITAMIN D3) 25 MCG (1000 UNIT) tablet Take 4,000 Units by mouth daily.     desvenlafaxine  (PRISTIQ ) 25 MG 24 hr tablet TAKE 1 TABLET (25 MG TOTAL) BY MOUTH DAILY. 90 tablet 0   lactose free nutrition (BOOST) LIQD Take 237 mLs by mouth 3 (three) times daily between meals.     levothyroxine  (SYNTHROID ) 75 MCG tablet TAKE 1 TABLET BY MOUTH DAILY BEFORE BREAKFAST. 90 tablet 1   mirabegron  ER (MYRBETRIQ ) 25 MG TB24 tablet Take 1 tablet (25 mg total) by mouth daily. 30 tablet 11   Multiple Vitamins-Minerals (MULTIVITAMIN WOMEN PO) Take 1 tablet by mouth daily.     ondansetron  (ZOFRAN ) 4 MG tablet Take 1 tablet (4 mg total) by mouth every 8 (eight) hours as needed for nausea or vomiting. 60 tablet 3   Probiotic Product (DIGESTIVE ADV PREBIOT+PROBIOT) CHEW Chew 2 each by mouth daily.  rosuvastatin  (CRESTOR ) 5 MG tablet Take 1 tablet (5 mg total) by mouth at bedtime. 90 tablet 1   traMADol  (ULTRAM ) 50 MG tablet Take 1 tablet (50 mg total) by mouth at bedtime. 30 tablet 0   nilotinib  (TASIGNA ) 150 MG capsule Take 2 capsules (300 mg total) by mouth every 12 (twelve) hours. (Patient not taking: Reported on 09/27/2024) 120 capsule 2   No current facility-administered medications for this visit.   Facility-Administered Medications Ordered in Other Visits  Medication Dose Route Frequency Provider Last Rate Last Admin   Influenza vac split trivalent PF (FLUZONE  HIGH-DOSE) injection 0.5 mL  0.5 mL Intramuscular Once Jalynne Persico, MD        REVIEW OF SYSTEMS:   Constitutional: Denies fevers, chills or abnormal weight loss Eyes: Denies blurriness of vision Ears, nose, mouth, throat, and face: Denies mucositis or sore  throat Respiratory: Denies cough, dyspnea or wheezes Cardiovascular: Denies palpitation, chest discomfort or lower extremity swelling Gastrointestinal:  Denies nausea, heartburn or change in bowel habits Skin: Denies abnormal skin rashes Lymphatics: Denies new lymphadenopathy or easy bruising Neurological:Denies numbness, tingling or new weaknesses Behavioral/Psych: Mood is stable, no new changes  All other systems were reviewed with the patient and are negative.   VITALS:  Blood pressure 134/68, pulse 76, temperature (!) 97.1 F (36.2 C), temperature source Tympanic, resp. rate 17, height 5' 5 (1.651 m), weight 216 lb (98 kg), SpO2 98%.  Wt Readings from Last 3 Encounters:  09/27/24 216 lb (98 kg)  09/03/24 218 lb (98.9 kg)  08/15/24 214 lb 11.7 oz (97.4 kg)    Body mass index is 35.94 kg/m.  Performance status (ECOG): 1 - Symptomatic but completely ambulatory  PHYSICAL EXAM:   GENERAL:alert, no distress and comfortable SKIN: skin color, texture, turgor are normal, no rashes or significant lesions LYMPH:  no palpable lymphadenopathy in the cervical, axillary or inguinal LUNGS: clear to auscultation and percussion with normal breathing effort HEART: regular rate & rhythm and no murmurs and no lower extremity edema ABDOMEN:abdomen soft, non-tender and normal bowel sounds Musculoskeletal:no cyanosis of digits and no clubbing  NEURO: alert & oriented x 3 with fluent speech  LABORATORY DATA:  I have reviewed the data as listed   Lab Results  Component Value Date   WBC 10.6 (H) 09/06/2024   NEUTROABS 8.2 (H) 09/06/2024   HGB 11.7 (L) 09/06/2024   HCT 37.4 09/06/2024   MCV 90.1 09/06/2024   PLT 282 09/06/2024      Chemistry      Component Value Date/Time   NA 140 09/06/2024 0758   NA 140 06/22/2024 1129   K 4.7 09/06/2024 0758   CL 104 09/06/2024 0758   CO2 25 09/06/2024 0758   BUN 38 (H) 09/06/2024 0758   BUN 34 (H) 06/22/2024 1129   CREATININE 1.21 (H)  09/06/2024 0758   CREATININE 0.87 07/14/2011 0938      Component Value Date/Time   CALCIUM  9.3 09/06/2024 0758   ALKPHOS 137 (H) 09/06/2024 0758   AST 32 09/06/2024 0758   ALT 24 09/06/2024 0758   BILITOT 0.5 09/06/2024 0758   BILITOT 0.3 06/22/2024 1129        Latest Reference Range & Units 09/06/24 07:57  b2a2 transcript % <0.0032  b3a2 transcript % <0.0032  E1A2 Transcript % <0.0032  Interpretation (BCRAL):  Negative    RADIOGRAPHIC STUDIES: I have personally reviewed the radiological images as listed and agreed with the findings in the report.  DG Hand Complete  Right CLINICAL DATA:  Bilateral hand pain and swelling.  EXAM: RIGHT HAND - COMPLETE 3+ VIEW  COMPARISON:  None Available.  FINDINGS: There is no evidence of fracture or dislocation. Mild degenerative changes seen involving the first carpometacarpal joint. Soft tissues are unremarkable.  IMPRESSION: Mild osteoarthritis of first carpometacarpal joint. No acute abnormality seen.  Electronically Signed   By: Lynwood Landy Raddle M.D.   On: 06/25/2024 12:55 DG Hand Complete Left CLINICAL DATA:  Bilateral hand pain and swelling.  EXAM: LEFT HAND - COMPLETE 3+ VIEW  COMPARISON:  None Available.  FINDINGS: There is no evidence of fracture or dislocation. Moderate degenerative changes seen involving first carpometacarpal joint. Soft tissues are unremarkable.  IMPRESSION: Moderate osteoarthritis of first carpometacarpal joint. No acute abnormality seen.  Electronically Signed   By: Lynwood Landy Raddle M.D.   On: 06/25/2024 12:54

## 2024-10-01 DIAGNOSIS — E669 Obesity, unspecified: Secondary | ICD-10-CM | POA: Diagnosis not present

## 2024-10-01 DIAGNOSIS — M7918 Myalgia, other site: Secondary | ICD-10-CM | POA: Diagnosis not present

## 2024-10-01 DIAGNOSIS — M2560 Stiffness of unspecified joint, not elsewhere classified: Secondary | ICD-10-CM | POA: Diagnosis not present

## 2024-10-01 DIAGNOSIS — M79642 Pain in left hand: Secondary | ICD-10-CM | POA: Diagnosis not present

## 2024-10-01 DIAGNOSIS — M2540 Effusion, unspecified joint: Secondary | ICD-10-CM | POA: Diagnosis not present

## 2024-10-01 DIAGNOSIS — Z6835 Body mass index (BMI) 35.0-35.9, adult: Secondary | ICD-10-CM | POA: Diagnosis not present

## 2024-10-01 DIAGNOSIS — M79641 Pain in right hand: Secondary | ICD-10-CM | POA: Diagnosis not present

## 2024-10-01 DIAGNOSIS — R7689 Other specified abnormal immunological findings in serum: Secondary | ICD-10-CM | POA: Diagnosis not present

## 2024-10-04 ENCOUNTER — Ambulatory Visit (INDEPENDENT_AMBULATORY_CARE_PROVIDER_SITE_OTHER): Admitting: Nurse Practitioner

## 2024-10-04 ENCOUNTER — Ambulatory Visit: Payer: Self-pay

## 2024-10-04 ENCOUNTER — Encounter: Payer: Self-pay | Admitting: Nurse Practitioner

## 2024-10-04 VITALS — BP 147/77 | HR 77 | Temp 97.0°F | Ht 65.0 in | Wt 216.0 lb

## 2024-10-04 DIAGNOSIS — J0101 Acute recurrent maxillary sinusitis: Secondary | ICD-10-CM | POA: Diagnosis not present

## 2024-10-04 MED ORDER — AMOXICILLIN-POT CLAVULANATE 875-125 MG PO TABS: 1.0000 | ORAL_TABLET | Freq: Two times a day (BID) | ORAL | 0 refills | Status: DC

## 2024-10-04 NOTE — Telephone Encounter (Signed)
 Called and spoke with patient. Got her scheduled to be seen today.

## 2024-10-04 NOTE — Telephone Encounter (Signed)
 She states she has gotten antibiotics in the past from her PCP that have helped greatly She states she has some nausea due to the sinus congestion/pressure and can't drive to come in for an appointment and requested a phone visit. Patient states that she has had telephone visits with her PCP Rock Bruns, FNP in the past and it just didn't use video.  Patient states that she would like a return phone call on if her PCP will call her for a visit or if she would just call the antibiotic in without having to have a visit. Patient states that if Rock Bruns requires her to come in for an office visit she will try to do that   FYI Only or Action Required?: Action required by provider: clinical question for provider and update on patient condition.  Patient was last seen in primary care on 06/22/2024 by Bruns Rock HERO, FNP.  Called Nurse Triage reporting Facial Pain.  Symptoms began 3 days ago.  Interventions attempted: OTC medications: nasal spray, tylenol  and Rest, hydration, or home remedies.  Symptoms are: gradually worsening.  Triage Disposition: See PCP When Office is Open (Within 3 Days)  Patient/caregiver understands and will follow disposition?: No, wishes to speak with PCP              Copied from CRM #[id number]. Topic: Clinical - Red Word Triage >> Oct 04, 2024  8:38 AM Tobias CROME wrote: Red Word that prompted transfer to Nurse Triage: sinus infection, headaches, under her eyes is swollen, making patient nauseous Reason for Disposition  [1] Using nasal washes and pain medicine > 24 hours AND [2] sinus pain (around cheekbone or eye) persists  Answer Assessment - Initial Assessment Questions Tylenol  Using nasal spray--not helping Happens twice a year 3 days ago it started She states that her sinuses hurt all the way into the tops of her teeth She denies fever, chest pain, or difficulty breathing She states she has gotten antibiotics in the past from her PCP that have  helped greatly She states she has some nausea due to the sinus congestion/pressure and can't drive to come in for an appointment and requested a phone visit. Patient states that she has had telephone visits with her PCP Rock Bruns, FNP in the past and it just didn't use video.  Patient states that she would like a return phone call on if her PCP will call her for a visit or if she would just call the antibiotic in without having to have a visit.  Patient is advised to call us  back if anything changes or with any further questions/concerns. Patient is advised that if anything worsens to go to the Emergency Room. Patient verbalized understanding.    1. LOCATION: Where does it hurt?      Sinuses, head,  2. ONSET: When did the sinus pain start?  (e.g., hours, days)      About 3 days 3. SEVERITY: How bad is the pain?   (Scale 0-10; or none, mild, moderate or severe)     Hurts so much and  4. RECURRENT SYMPTOM: Have you ever had sinus problems before? If Yes, ask: When was the last time? and What happened that time?      Yes every year   twice a year 5. NASAL CONGESTION: Is the nose blocked? If Yes, ask: Can you open it or must you breathe through your mouth?     congested 6. NASAL DISCHARGE: Do you have discharge from your nose?  If so ask, What color?     Clear at first now just congested 7. FEVER: Do you have a fever? If Yes, ask: What is it, how was it measured, and when did it start?      no 8. OTHER SYMPTOMS: Do you have any other symptoms? (e.g., sore throat, cough, earache, difficulty breathing)     Headache,  Protocols used: Sinus Pain or Congestion-A-AH

## 2024-10-04 NOTE — Progress Notes (Signed)
 Subjective:    Patient ID: Andrea Martinez, female    DOB: 07/24/1946, 78 y.o.   MRN: [id number]   Chief Complaint: URI  Sinus Problem This is a new problem. The current episode started in the past 7 days. The problem has been gradually worsening since onset. There has been no fever. Her pain is at a severity of 7/10. Associated symptoms include congestion, coughing and sinus pressure. Pertinent negatives include no sore throat. Past treatments include nothing. The treatment provided mild relief.    Patient Active Problem List   Diagnosis Date Noted   Elevated antinuclear antibody (ANA) level 08/15/2024   Insomnia 08/15/2024   Bilateral hand swelling 06/25/2024   Depression, recurrent 04/20/2024   Stage 3a chronic kidney disease (HCC) 02/23/2024   Atypical chronic myeloid leukemia, BCR/ABL-negative not having achieved remission (HCC) 02/22/2024   Chronic combined systolic and diastolic congestive heart failure (HCC) 02/22/2024   GAD (generalized anxiety disorder) 02/22/2024   Primary osteoarthritis of left hip 06/29/2023   RLQ abdominal pain 02/24/2023   History of peptic ulcer disease 02/24/2023   Vitamin D  deficiency 10/22/2022   Vitamin B 12 deficiency 10/22/2022   PUD (peptic ulcer disease) 06/23/2022   Sacroiliac joint pain 05/03/2022   Duodenal ulcer, perforated (HCC) 03/18/2022   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 02/28/2022   Major depressive disorder, single episode, mild degree 02/26/2022   Perforation of duodenum (HCC) 02/17/2022   Duodenitis 02/17/2022   Diarrhea    Pain in joint of right hip 01/01/2022   OA (osteoarthritis) of hip 11/18/2021   Primary osteoarthritis of right hip 11/18/2021   S/P lumbar fusion 01/28/2021   Mixed hyperlipidemia    Atherosclerosis of aorta 09/14/2020   Anxiety 09/14/2020   Chronic low back pain 09/14/2020   Presence of left artificial knee joint 10/31/2019   Thoracic compression fracture (HCC) 02/14/2019   Sweating  abnormality 10/11/2017   Iron deficiency anemia 02/16/2017   Solitary pulmonary nodule 10/23/2016   Bladder prolapse, female, acquired 11/21/2015   IBS (irritable bowel syndrome) 11/21/2015   Neutrophilic leukocytosis 11/21/2015   OA (osteoarthritis) of knee 11/21/2015   CML (chronic myeloid leukemia) (HCC) 11/20/2015   Dyspepsia 11/30/2012   EPIGASTRIC PAIN 02/12/2010   Hypothyroidism 02/11/2010   Nausea and vomiting 02/11/2010   Continuous severe abdominal pain 02/11/2010       Review of Systems  HENT:  Positive for congestion and sinus pressure. Negative for sore throat.   Respiratory:  Positive for cough.        Objective:   Physical Exam Constitutional:      Appearance: Normal appearance. She is obese.  HENT:     Right Ear: Tympanic membrane normal.     Left Ear: Tympanic membrane normal.     Nose: Congestion and rhinorrhea present.     Right Sinus: Maxillary sinus tenderness present.     Left Sinus: Maxillary sinus tenderness present.  Cardiovascular:     Rate and Rhythm: Normal rate and regular rhythm.     Heart sounds: Normal heart sounds.  Pulmonary:     Effort: Pulmonary effort is normal.     Breath sounds: Normal breath sounds.  Skin:    General: Skin is warm.  Neurological:     General: No focal deficit present.     Mental Status: She is alert and oriented to person, place, and time.  Psychiatric:        Mood and Affect: Mood normal.  Behavior: Behavior normal.     BP (!) 147/77   Pulse 77   Temp (!) 97 F (36.1 C) (Temporal)   Ht 5' 5 (1.651 m)   Wt 216 lb (98 kg)   SpO2 92%   BMI 35.94 kg/m        Assessment & Plan:  Andrea Martinez in today with chief complaint of Sinus Problem (Face hurting and cough since Tuesday)   1. Acute recurrent maxillary sinusitis (Primary) 1. Take meds as prescribed 2. Use a cool mist humidifier especially during the winter months and when heat has been humid. 3. Use saline nose sprays frequently 4.  Saline irrigations of the nose can be very helpful if done frequently.  * 4X daily for 1 week*  * Use of a nettie pot can be helpful with this. Follow directions with this* 5. Drink plenty of fluids 6. Keep thermostat turn down low 7.For any cough or congestion- mucinex  8. For fever or aces or pains- take tylenol  or ibuprofen appropriate for age and weight.  * for fevers greater than 101 orally you may alternate ibuprofen and tylenol  every  3 hours.    - amoxicillin -clavulanate (AUGMENTIN ) 875-125 MG tablet; Take 1 tablet by mouth 2 (two) times daily.  Dispense: 20 tablet; Refill: 0    The above assessment and management plan was discussed with the patient. The patient verbalized understanding of and has agreed to the management plan. Patient is aware to call the clinic if symptoms persist or worsen. Patient is aware when to return to the clinic for a follow-up visit. Patient educated on when it is appropriate to go to the emergency department.   Shilpa-Margaret Lunger, FNP

## 2024-10-04 NOTE — Patient Instructions (Signed)

## 2024-10-08 ENCOUNTER — Telehealth: Payer: Self-pay | Admitting: Oncology

## 2024-10-11 ENCOUNTER — Ambulatory Visit (INDEPENDENT_AMBULATORY_CARE_PROVIDER_SITE_OTHER): Admitting: Family Medicine

## 2024-10-11 ENCOUNTER — Encounter: Payer: Self-pay | Admitting: Family Medicine

## 2024-10-11 VITALS — BP 124/66 | HR 99 | Temp 96.4°F | Ht 65.0 in | Wt 213.2 lb

## 2024-10-11 DIAGNOSIS — F320 Major depressive disorder, single episode, mild: Secondary | ICD-10-CM

## 2024-10-11 DIAGNOSIS — J069 Acute upper respiratory infection, unspecified: Secondary | ICD-10-CM | POA: Diagnosis not present

## 2024-10-11 DIAGNOSIS — F411 Generalized anxiety disorder: Secondary | ICD-10-CM

## 2024-10-11 DIAGNOSIS — R062 Wheezing: Secondary | ICD-10-CM | POA: Diagnosis not present

## 2024-10-11 DIAGNOSIS — F439 Reaction to severe stress, unspecified: Secondary | ICD-10-CM

## 2024-10-11 MED ORDER — ALBUTEROL SULFATE HFA 108 (90 BASE) MCG/ACT IN AERS: 2.0000 | INHALATION_SPRAY | Freq: Four times a day (QID) | RESPIRATORY_TRACT | 0 refills | Status: AC | PRN

## 2024-10-11 MED ORDER — METHYLPREDNISOLONE ACETATE 80 MG/ML IJ SUSP
80.0000 mg | Freq: Once | INTRAMUSCULAR | Status: AC
  Administered 2024-10-11: 40 mg via INTRAMUSCULAR

## 2024-10-11 MED ORDER — AZITHROMYCIN 500 MG PO TABS: 500.0000 mg | ORAL_TABLET | Freq: Every day | ORAL | 0 refills | Status: AC

## 2024-10-11 MED ORDER — DESVENLAFAXINE SUCCINATE ER 50 MG PO TB24: 50.0000 mg | ORAL_TABLET | Freq: Every day | ORAL | 3 refills | Status: DC

## 2024-10-11 NOTE — Progress Notes (Signed)
 Subjective:  Patient ID: Andrea Martinez, female    DOB: [birth year], 78 y.o.   MRN: [id number]  Patient Care Team: Severa Rock HERO, FNP as PCP - General (Family Medicine) Lenis Ethelle ORN, MD as Consulting Physician (Endocrinology) Burnetta Aures, MD as Consulting Physician (Orthopedic Surgery)   Chief Complaint:  Cough and Nasal Congestion   HPI: Andrea Martinez is a 78 y.o. female presenting on 10/11/2024 for Cough and Nasal Congestion    Andrea Martinez is a 78 year old female with rheumatoid arthritis and lupus who presents with persistent cough.  Cough and wheezing - Persistent cough for approximately one week - Associated with wheezing - No improvement with Delsom cough syrup, nasal sprays, or a course of Augmentin   Glucocorticoid therapy and autoimmune disease symptoms - Currently taking prednisone  for rheumatoid arthritis and lupus - Initial dose 20-40 mg daily, reduced to 5 mg daily due to stomach upset - Current regimen: two pills daily on a tapering schedule - Some improvement in symptoms with reduced dose  Depressive symptoms and insomnia - Depression with poor response to Pristiq  25 mg - Difficulty sleeping - Increased stress related to husband's recent diagnosis of stage four bladder cancer with metastasis to the back and ongoing chemotherapy  Visual disturbance and macular degeneration - Recent eye examination led to referral to retinal specialist for macular degeneration - Scheduled to begin monthly eye injections to reduce swelling prior to cataract surgery          Relevant past medical, surgical, family, and social history reviewed and updated as indicated.  Allergies and medications reviewed and updated. Data reviewed: Chart in Epic.   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 Mirna   Lipoma    left upper arm   Mixed hyperlipidemia    Osteoporosis    Pericardial effusion    a. HCAP complicated by pericardial effusion requiring pericardial window 10/2016 and large L pleural effusion requring VATS.   Pleural effusion    a. s/p VATS 2017.   Pneumonia 10/2016   PONV (postoperative nausea and vomiting)    history of   Prolonged QT interval    Urine incontinence    UTI (lower urinary tract infection)    frequent   Vitamin D  deficiency     Past Surgical History:  Procedure Laterality Date   BIOPSY  03/20/2022   Procedure: BIOPSY;  Surgeon: Cindie Carlin POUR, DO;  [facility];  Service: Endoscopy;;  gastric lesion vs. gastric ulcer   BIOPSY  06/28/2022   Procedure: BIOPSY;  Surgeon: Cindie Carlin POUR, DO;  [facility];  Service: Endoscopy;;   BIOPSY  05/10/2023   Procedure: BIOPSY;  Surgeon: Cindie Carlin POUR, DO;  [facility];  Service: Endoscopy;;   BRAVO PH STUDY  12/04/2012   Procedure: BRAVO PH STUDY;  Surgeon: Margo LITTIE Haddock, MD;  [facility];  Service: Endoscopy;  Laterality: N/A;   CHOLECYSTECTOMY N/A 01/03/2014   Procedure: LAPAROSCOPIC CHOLECYSTECTOMY WITH INTRAOPERATIVE CHOLANGIOGRAM;  Surgeon: Debby LABOR. Cornett, MD;  Location: [facility];  Service: General;  Laterality: N/A;   COLONOSCOPY  01/2004   DR [facility], few small  tics   COLONOSCOPY WITH PROPOFOL  N/A 05/10/2023   Procedure: COLONOSCOPY WITH PROPOFOL ;  Surgeon: Cindie Carlin POUR, DO;  [facility];  Service: Endoscopy;  Laterality: N/A;  1245pm, asa 3   ESOPHAGOGASTRODUODENOSCOPY  02/2010   gastric ulcers   ESOPHAGOGASTRODUODENOSCOPY  12/04/2012   DOQ:[id number] gastritis (inflammation) was found in the gastric antrum; multiple biopsies The duodenal mucosa showed no abnormalities in the bulb and second portion of the duodenum    ESOPHAGOGASTRODUODENOSCOPY (EGD) WITH PROPOFOL  N/A 03/20/2022   Procedure: ESOPHAGOGASTRODUODENOSCOPY (EGD) WITH PROPOFOL ;  Surgeon: Cindie Carlin POUR, DO;  [facility];  Service: Endoscopy;  Laterality: N/A;   ESOPHAGOGASTRODUODENOSCOPY (EGD) WITH PROPOFOL  N/A 06/28/2022   Procedure: ESOPHAGOGASTRODUODENOSCOPY (EGD) WITH PROPOFOL ;  Surgeon: Cindie Carlin POUR, DO;  [facility];  Service: Endoscopy;  Laterality: N/A;  800   ESOPHAGOGASTRODUODENOSCOPY (EGD) WITH PROPOFOL  N/A 05/10/2023   Procedure: ESOPHAGOGASTRODUODENOSCOPY (EGD) WITH PROPOFOL ;  Surgeon: Cindie Carlin POUR, DO;  [facility];  Service: Endoscopy;  Laterality: N/A;   JOINT REPLACEMENT Right 10/22/2019   KYPHOPLASTY N/A 02/14/2019   Procedure: KYPHOPLASTY T12 and L1;  Surgeon: Burnetta Aures, MD;  Location: [facility];  Service: Orthopedics;  Laterality: N/A;  120 mins   LASIK     LIPOMA EXCISION  08/02/2011   left shoulder   NOSE SURGERY     PARTIAL HYSTERECTOMY     vaginal at age 2 years of age   POLYPECTOMY  05/10/2023   Procedure: POLYPECTOMY;  Surgeon: Cindie Carlin POUR, DO;  [facility];  Service: Endoscopy;;   TOE DEBRIDEMENT Left 1962   lt great toe   TOTAL HIP ARTHROPLASTY Right 11/18/2021   Procedure: TOTAL HIP ARTHROPLASTY ANTERIOR APPROACH;  Surgeon: Melodi Lerner, MD;  Location: [facility];  Service: Orthopedics;  Laterality: Right;   TOTAL HIP ARTHROPLASTY Left 06/29/2023   Procedure: LEFT TOTAL HIP ARTHROPLASTY ANTERIOR APPROACH;  Surgeon: Melodi Lerner, MD;  Location: [facility];  Service: Orthopedics;  Laterality: Left;   TOTAL KNEE ARTHROPLASTY Right 10/22/2019   Procedure: TOTAL KNEE ARTHROPLASTY;  Surgeon: Melodi Lerner, MD;  Location: [facility];  Service: Orthopedics;  Laterality: Right;    TOTAL KNEE ARTHROPLASTY Left 03/03/2020   Procedure: TOTAL KNEE ARTHROPLASTY;  Surgeon: Melodi Lerner, MD;  Location: [facility];  Service: Orthopedics;  Laterality: Left;     TRANSFORAMINAL LUMBAR INTERBODY FUSION (TLIF) WITH PEDICLE SCREW FIXATION 1 LEVEL N/A 01/28/2021   Procedure: TRANSFORAMINAL LUMBAR INTERBODY FUSION (TLIF) LUMBAR FIVE-SACRAL ONE;  Surgeon: Burnetta Aures, MD;  Location[facility];  Service: Orthopedics;  Laterality: N/A;  4 hrs   VIDEO ASSISTED THORACOSCOPY (VATS)/EMPYEMA Left 10/25/2016   Procedure: VIDEO ASSISTED THORACOSCOPY (VATS), BRONCH,DRAINAGE OF PLEURAL EFFUSION,PERICARDIAL WINDOW WITH DRAINAGE OF PERICARDIAL FLUID, TEE;  Surgeon: Elspeth JAYSON Millers, MD;  Location: [facility];  Service: Thoracic;  Laterality: Left;   VIDEO BRONCHOSCOPY N/A 10/25/2016   Procedure: VIDEO BRONCHOSCOPY;  Surgeon: Elspeth JAYSON Millers, MD;  Location: [facility];  Service: Thoracic;  Laterality: N/A;    Social History   Socioeconomic History   Marital status: Married    Spouse name: Andrea Martinez   Number of children: 2   Years of education: 12th grade   Highest education level: Not on file  Occupational History   Occupation: retired    Associate Professor: UENPLOYED    Employer: RETIRED  Tobacco Use   Smoking status: Never    Passive exposure: Never   Smokeless tobacco: Never  Vaping Use   Vaping  status: Never Used  Substance and Sexual Activity   Alcohol use: No    Alcohol/week: 0.0 standard drinks of alcohol   Drug use: No   Sexual activity: Yes    Partners: Male    Birth control/protection: None  Other Topics Concern   Not on file  Social History Narrative   1 son, 1 daughter.   2 granddaughters.   Retired HR.    Social Drivers of Corporate Investment Banker Strain: Low Risk  (05/23/2024)   Overall Financial Resource Strain (CARDIA)    Difficulty of Paying Living Expenses: Not hard at all  Food Insecurity: No Food Insecurity (05/23/2024)   Hunger Vital Sign    Worried About Running Out of Food in the Last Year: Never true    Ran Out of Food in the Last Year: Never true  Transportation Needs: No Transportation Needs (05/23/2024)   PRAPARE - Therapist, Art (Medical): No    Lack of Transportation (Non-Medical): No  Physical Activity: Sufficiently Active (05/23/2024)   Exercise Vital Sign    Days of Exercise per Week: 7 days    Minutes of Exercise per Session: 30 min  Stress: Stress Concern Present (05/23/2024)   [employer] of Occupational Health - Occupational Stress Questionnaire    Feeling of Stress : Very much  Social Connections: Moderately Isolated (05/23/2024)   Social Connection and Isolation Panel    Frequency of Communication with Friends and Family: More than three times a week    Frequency of Social Gatherings with Friends and Family: More than three times a week    Attends Religious Services: Never    Database Administrator or Organizations: No    Attends Banker Meetings: Never    Marital Status: Married  Catering Manager Violence: Not At Risk (05/23/2024)   Humiliation, Afraid, Rape, and Kick questionnaire    Fear of Current or Ex-Partner: No    Emotionally Abused: No    Physically Abused: No    Sexually Abused: No    Outpatient Encounter Medications as of 10/11/2024  Medication Sig   Acetaminophen  (ACETAMIN PO) Take by mouth as needed.   albuterol  (VENTOLIN  HFA) 108 (90 Base) MCG/ACT inhaler Inhale 2 puffs into the lungs every 6 (six) hours as needed for wheezing or shortness of breath.   amoxicillin -clavulanate (AUGMENTIN ) 875-125 MG tablet Take 1 tablet by mouth 2 (two) times daily.   azithromycin  (ZITHROMAX ) 500 MG tablet Take 1 tablet (500 mg total) by mouth daily for 3 days.   cetirizine (ZYRTEC) 10 MG tablet Take 10 mg by mouth daily.   cholecalciferol  (VITAMIN D3) 25 MCG (1000 UNIT) tablet Take 4,000 Units by mouth daily.   desvenlafaxine  (PRISTIQ ) 50 MG 24 hr tablet Take 1 tablet (50 mg total) by mouth daily.   lactose free nutrition (BOOST) LIQD Take 237 mLs by mouth 3 (three) times daily between meals.   levothyroxine  (SYNTHROID ) 75 MCG tablet TAKE 1 TABLET BY MOUTH DAILY  BEFORE BREAKFAST.   mirabegron  ER (MYRBETRIQ ) 25 MG TB24 tablet Take 1 tablet (25 mg total) by mouth daily.   Multiple Vitamins-Minerals (MULTIVITAMIN WOMEN PO) Take 1 tablet by mouth daily.   ondansetron  (ZOFRAN ) 4 MG tablet Take 1 tablet (4 mg total) by mouth every 8 (eight) hours as needed for nausea or vomiting.   Probiotic Product (DIGESTIVE ADV PREBIOT+PROBIOT) CHEW Chew 2 each by mouth daily.   rosuvastatin  (CRESTOR ) 5 MG tablet Take 1 tablet (5 mg  total) by mouth at bedtime.   traMADol  (ULTRAM ) 50 MG tablet Take 1 tablet (50 mg total) by mouth at bedtime.   [DISCONTINUED] desvenlafaxine  (PRISTIQ ) 25 MG 24 hr tablet TAKE 1 TABLET (25 MG TOTAL) BY MOUTH DAILY.   nilotinib  (TASIGNA ) 150 MG capsule Take 2 capsules (300 mg total) by mouth every 12 (twelve) hours. (Patient not taking: Reported on 10/11/2024)   Facility-Administered Encounter Medications as of 10/11/2024  Medication   Influenza vac split trivalent PF (FLUZONE  HIGH-DOSE) injection 0.5 mL    Allergies  Allergen Reactions   Nucynta [Tapentadol Hcl] Swelling   Codeine Nausea Only and Other (See Comments)    Headache   Sulfonamide Derivatives Nausea Only and Other (See Comments)    Headache   Nexium [Esomeprazole Magnesium ] Nausea Only   Sulfa Antibiotics Nausea Only    Headache    Fluconazole Rash    Redness and blistering on left thigh    Pertinent ROS per HPI, otherwise unremarkable      Objective:  BP 124/66   Pulse 99   Temp (!) 96.4 F (35.8 C)   Ht 5' 5 (1.651 m)   Wt 213 lb 3.2 oz (96.7 kg)   SpO2 94%   BMI 35.48 kg/m    Wt Readings from Last 3 Encounters:  10/11/24 213 lb 3.2 oz (96.7 kg)  10/04/24 216 lb (98 kg)  09/27/24 216 lb (98 kg)    Physical Exam Vitals and nursing note reviewed.  Constitutional:      Appearance: She is obese. She is ill-appearing (chronically) and diaphoretic (chronic).  HENT:     Head: Normocephalic and atraumatic.     Right Ear: Tympanic membrane, ear canal and  external ear normal.     Left Ear: Tympanic membrane, ear canal and external ear normal.     Nose: Nose normal.     Mouth/Throat:     Mouth: Mucous membranes are moist.     Pharynx: Posterior oropharyngeal erythema present. No oropharyngeal exudate.  Eyes:     Conjunctiva/sclera: Conjunctivae normal.     Pupils: Pupils are equal, round, and reactive to light.  Cardiovascular:     Rate and Rhythm: Normal rate and regular rhythm.  Pulmonary:     Effort: Pulmonary effort is normal.     Breath sounds: Wheezing present.  Musculoskeletal:     Cervical back: Neck supple.     Right lower leg: No edema.     Left lower leg: No edema.  Lymphadenopathy:     Cervical: No cervical adenopathy.  Skin:    General: Skin is warm.     Capillary Refill: Capillary refill takes less than 2 seconds.  Neurological:     General: No focal deficit present.     Mental Status: She is alert and oriented to person, place, and time.     Motor: Tremor present.  Psychiatric:        Attention and Perception: Attention and perception normal.        Mood and Affect: Mood is anxious. Affect is tearful.        Behavior: Behavior normal.        Thought Content: Thought content normal.        Judgment: Judgment normal.      Results for orders placed or performed in visit on 09/06/24  BCR-ABL1, CML/ALL, PCR, QUANT   Collection Time: 09/06/24  7:57 AM  Result Value Ref Range   b2a2 transcript <0.0032 %   b3a2 transcript <0.0032 %  E1A2 Transcript <0.0032 %   Interpretation (BCRAL): Negative    Director Review [facility]): Comment    Background: Comment    Methodology Comment    References (BCRAL) Comment   Lactate dehydrogenase   Collection Time: 09/06/24  7:58 AM  Result Value Ref Range   LDH 158 98 - 192 U/L  Comprehensive metabolic panel   Collection Time: 09/06/24  7:58 AM  Result Value Ref Range   Sodium 140 135 - 145 mmol/L   Potassium 4.7 3.5 - 5.1 mmol/L   Chloride 104 98 - 111 mmol/L   CO2 25 22  - 32 mmol/L   Glucose, Bld 113 (H) 70 - 99 mg/dL   BUN 38 (H) 8 - 23 mg/dL   Creatinine, Ser 8.78 (H) 0.44 - 1.00 mg/dL   Calcium  9.3 8.9 - 10.3 mg/dL   Total Protein 7.5 6.5 - 8.1 g/dL   Albumin 3.9 3.5 - 5.0 g/dL   AST 32 15 - 41 U/L   ALT 24 0 - 44 U/L   Alkaline Phosphatase 137 (H) 38 - 126 U/L   Total Bilirubin 0.5 0.0 - 1.2 mg/dL   GFR, Estimated 46 (L) >60 mL/min   Anion gap 11 5 - 15  CBC with Differential   Collection Time: 09/06/24  7:58 AM  Result Value Ref Range   WBC 10.6 (H) 4.0 - 10.5 K/uL   RBC 4.15 3.87 - 5.11 MIL/uL   Hemoglobin 11.7 (L) 12.0 - 15.0 g/dL   HCT 62.5 63.9 - 53.9 %   MCV 90.1 80.0 - 100.0 fL   MCH 28.2 26.0 - 34.0 pg   MCHC 31.3 30.0 - 36.0 g/dL   RDW 86.7 88.4 - 84.4 %   Platelets 282 150 - 400 K/uL   nRBC 0.0 0.0 - 0.2 %   Neutrophils Relative % 77 %   Neutro Abs 8.2 (H) 1.7 - 7.7 K/uL   Lymphocytes Relative 13 %   Lymphs Abs 1.4 0.7 - 4.0 K/uL   Monocytes Relative 7 %   Monocytes Absolute 0.8 0.1 - 1.0 K/uL   Eosinophils Relative 2 %   Eosinophils Absolute 0.2 0.0 - 0.5 K/uL   Basophils Relative 0 %   Basophils Absolute 0.0 0.0 - 0.1 K/uL   Immature Granulocytes 1 %   Abs Immature Granulocytes 0.06 0.00 - 0.07 K/uL   *Note: Due to a large number of results and/or encounters for the requested time period, some results have not been displayed. A complete set of results can be found in Results Review.       Pertinent labs & imaging results that were available during my care of the patient were reviewed by me and considered in my medical decision making.  Assessment & Plan:  Ronal Ronal Fees was seen today for cough and nasal congestion.  Diagnoses and all orders for this visit:  URI with cough and congestion -     azithromycin  (ZITHROMAX ) 500 MG tablet; Take 1 tablet (500 mg total) by mouth daily for 3 days. -     albuterol  (VENTOLIN  HFA) 108 (90 Base) MCG/ACT inhaler; Inhale 2 puffs into the lungs every 6 (six) hours as needed for  wheezing or shortness of breath.  Wheezing -     azithromycin  (ZITHROMAX ) 500 MG tablet; Take 1 tablet (500 mg total) by mouth daily for 3 days. -     albuterol  (VENTOLIN  HFA) 108 (90 Base) MCG/ACT inhaler; Inhale 2 puffs into the lungs every 6 (six) hours  as needed for wheezing or shortness of breath.  Situational stress -     desvenlafaxine  (PRISTIQ ) 50 MG 24 hr tablet; Take 1 tablet (50 mg total) by mouth daily.  Major depressive disorder, single episode, mild degree -     desvenlafaxine  (PRISTIQ ) 50 MG 24 hr tablet; Take 1 tablet (50 mg total) by mouth daily.  GAD (generalized anxiety disorder) -     desvenlafaxine  (PRISTIQ ) 50 MG 24 hr tablet; Take 1 tablet (50 mg total) by mouth daily.       Cough with wheezing Persistent cough and wheezing for one week despite Augmentin  and nasal sprays. Wheezing on examination. Currently on low-dose prednisone  for rheumatoid arthritis and lupus. - Administered prednisone  injection for acute exacerbation. - Prescribed albuterol  inhaler for use every 6-8 hours as needed for cough and wheezing. - Prescribed azithromycin  (Zithromax ) in addition to completing current Augmentin  course. - Advised to continue current prednisone  regimen with food to minimize gastrointestinal upset.  Depression Current treatment of Pristiq  25 mg is ineffective. Increased stress due to personal circumstances, including husband's cancer diagnosis. - Increased Pristiq  to 50 mg daily. Instructed to take two 25 mg tablets until current supply is exhausted, then switch to 50 mg tablets. - Scheduled follow-up appointment in December to assess response to increased Pristiq  dose.          Continue all other maintenance medications.  Follow up plan: Return in 6 weeks (on 11/22/2024), or if symptoms worsen or fail to improve, for Depression, Anxiety.   Continue healthy lifestyle choices, including diet (rich in fruits, vegetables, and lean proteins, and low in salt and  simple carbohydrates) and exercise (at least 30 minutes of moderate physical activity daily).  Educational handout given for URI  The above assessment and management plan was discussed with the patient. The patient verbalized understanding of and has agreed to the management plan. Patient is aware to call the clinic if they develop any new symptoms or if symptoms persist or worsen. Patient is aware when to return to the clinic for a follow-up visit. Patient educated on when it is appropriate to go to the emergency department.   Rosaline Bruns, FNP-C [facility] [phone]

## 2024-10-11 NOTE — Addendum Note (Signed)
 Addended by: OLENA RAISIN C on: 10/11/2024 09:39 AM   Modules accepted: Orders

## 2024-10-18 ENCOUNTER — Other Ambulatory Visit: Payer: Self-pay | Admitting: *Deleted

## 2024-10-18 DIAGNOSIS — Z6835 Body mass index (BMI) 35.0-35.9, adult: Secondary | ICD-10-CM | POA: Diagnosis not present

## 2024-10-18 DIAGNOSIS — R7689 Other specified abnormal immunological findings in serum: Secondary | ICD-10-CM | POA: Diagnosis not present

## 2024-10-18 DIAGNOSIS — M79642 Pain in left hand: Secondary | ICD-10-CM | POA: Diagnosis not present

## 2024-10-18 DIAGNOSIS — M109 Gout, unspecified: Secondary | ICD-10-CM | POA: Diagnosis not present

## 2024-10-18 DIAGNOSIS — E669 Obesity, unspecified: Secondary | ICD-10-CM | POA: Diagnosis not present

## 2024-10-18 DIAGNOSIS — M79641 Pain in right hand: Secondary | ICD-10-CM | POA: Diagnosis not present

## 2024-10-18 MED ORDER — TRAMADOL HCL 50 MG PO TABS: 50.0000 mg | ORAL_TABLET | Freq: Every day | ORAL | 0 refills | Status: AC

## 2024-10-19 DIAGNOSIS — H353211 Exudative age-related macular degeneration, right eye, with active choroidal neovascularization: Secondary | ICD-10-CM | POA: Diagnosis not present

## 2024-10-23 ENCOUNTER — Ambulatory Visit: Payer: Self-pay | Admitting: Family Medicine

## 2024-10-26 ENCOUNTER — Ambulatory Visit (INDEPENDENT_AMBULATORY_CARE_PROVIDER_SITE_OTHER): Admitting: Family Medicine

## 2024-10-26 VITALS — BP 136/74 | HR 100 | Temp 97.6°F | Ht 65.0 in | Wt 209.0 lb

## 2024-10-26 DIAGNOSIS — M545 Low back pain, unspecified: Secondary | ICD-10-CM

## 2024-10-26 DIAGNOSIS — M549 Dorsalgia, unspecified: Secondary | ICD-10-CM

## 2024-10-26 MED ORDER — CYCLOBENZAPRINE HCL 5 MG PO TABS: 5.0000 mg | ORAL_TABLET | Freq: Three times a day (TID) | ORAL | 0 refills | Status: DC | PRN

## 2024-10-26 NOTE — Progress Notes (Signed)
 Acute Office Visit  Patient ID: Andrea Martinez, female    DOB: 01-11-1946, 78 y.o.   MRN: [id number]  PCP: Severa Rock HERO, FNP  Chief Complaint  Patient presents with   Spasms    Lower Back pain for about a week. Patient has been taking OTC extra strength tylenol  and using a heating pad to help with the pain. Reports very little relief. Patient also reports that she takes Tramadol  at night.     Subjective:     HPI  Discussed the use of AI scribe software for clinical note transcription with the patient, who gave verbal consent to proceed.  History of Present Illness   Andrea Martinez Andrea Martinez is a 78 year old female here today for CC of back pain.   Lower back pain - New onset lower back pain for approximately one week - Pain is persistent and worsened by sitting upright - Pain is alleviated by changing positions - Some relief obtained with use of a heating pad - No recent injuries or changes in activity - History of two prior back surgeries - Using tramadol  at night - Patient feels that the pain may be muscle spasms and feels like the muscles are pulling  Hematologic malignancy - Chronic leukemia with prior chemotherapy for nine years - Currently off chemotherapy - Undergoing monthly laboratory monitoring - Followed by oncologist   Genitourinary symptoms - No dysuria or urinary frequency - Patient reports that she has had UTIs before and that this does not feel the same      ROS     Objective:    BP 136/74   Pulse 100   Temp 97.6 F (36.4 C)   Ht 5' 5 (1.651 m)   Wt 209 lb (94.8 kg)   SpO2 98%   BMI 34.78 kg/m    Physical Exam Vitals reviewed.  Constitutional:      Appearance: Normal appearance.  HENT:     Head: Normocephalic and atraumatic.  Eyes:     Extraocular Movements: Extraocular movements intact.     Conjunctiva/sclera: Conjunctivae normal.     Pupils: Pupils are equal, round, and reactive to light.  Cardiovascular:     Rate and  Rhythm: Normal rate and regular rhythm.     Pulses: Normal pulses.     Heart sounds: Normal heart sounds. No murmur heard. Pulmonary:     Effort: Pulmonary effort is normal. No respiratory distress.     Breath sounds: Normal breath sounds.  Musculoskeletal:        General: Tenderness present. Normal range of motion.     Cervical back: Normal range of motion.     Comments: Pain with palpation of muscles to bilat sides of mid back  Skin:    General: Skin is warm and dry.  Neurological:     General: No focal deficit present.     Mental Status: She is alert and oriented to person, place, and time.  Psychiatric:        Mood and Affect: Mood normal.        Behavior: Behavior normal.       No results found for any visits on 10/26/24.     Assessment & Plan:   Problem List Items Addressed This Visit   None Visit Diagnoses       Acute bilateral low back pain, unspecified whether sciatica present    -  Primary   Relevant Medications   cyclobenzaprine (FLEXERIL) 5 MG tablet  Acute mid back pain       Relevant Medications   cyclobenzaprine (FLEXERIL) 5 MG tablet       Assessment and Plan    Acute mid back pain - Prescribed flexeril prn to trial for muscle spasms - Advised caution with muscle relaxer use due to dizziness and fall risk. Discussed sitting on side of bed and chair for a minute before standing up.  - Follow up if symptoms acutely worsen, no improvement over the next 2-3 days, fever develops, or for any other concerns        Meds ordered this encounter  Medications   cyclobenzaprine (FLEXERIL) 5 MG tablet    Sig: Take 1 tablet (5 mg total) by mouth 3 (three) times daily as needed for muscle spasms.    Dispense:  30 tablet    Refill:  0    Supervising Provider:   JOLINDA NORENE HERO [[id number]]    Return if symptoms worsen or fail to improve.  Andrea LELON Severin, FNP [facility]

## 2024-10-26 NOTE — Patient Instructions (Signed)
 Follow up if symptoms acutely worsen, no improvement over the next 2-3 days, fever develops, or for any other concerns

## 2024-10-29 ENCOUNTER — Inpatient Hospital Stay

## 2024-11-05 ENCOUNTER — Inpatient Hospital Stay

## 2024-11-06 ENCOUNTER — Inpatient Hospital Stay

## 2024-11-13 ENCOUNTER — Inpatient Hospital Stay: Attending: Oncology

## 2024-11-13 DIAGNOSIS — R760 Raised antibody titer: Secondary | ICD-10-CM | POA: Diagnosis not present

## 2024-11-13 DIAGNOSIS — C9211 Chronic myeloid leukemia, BCR/ABL-positive, in remission: Secondary | ICD-10-CM | POA: Insufficient documentation

## 2024-11-13 DIAGNOSIS — E0590 Thyrotoxicosis, unspecified without thyrotoxic crisis or storm: Secondary | ICD-10-CM | POA: Insufficient documentation

## 2024-11-13 DIAGNOSIS — R300 Dysuria: Secondary | ICD-10-CM | POA: Diagnosis not present

## 2024-11-13 DIAGNOSIS — C9210 Chronic myeloid leukemia, BCR/ABL-positive, not having achieved remission: Secondary | ICD-10-CM

## 2024-11-13 LAB — CBC WITH DIFFERENTIAL/PLATELET
Abs Immature Granulocytes: 0.04 K/uL (ref 0.00–0.07)
Basophils Absolute: 0 K/uL (ref 0.0–0.1)
Basophils Relative: 0 %
Eosinophils Absolute: 0.2 K/uL (ref 0.0–0.5)
Eosinophils Relative: 2 %
HCT: 38.5 % (ref 36.0–46.0)
Hemoglobin: 12.1 g/dL (ref 12.0–15.0)
Immature Granulocytes: 0 %
Lymphocytes Relative: 14 %
Lymphs Abs: 1.5 K/uL (ref 0.7–4.0)
MCH: 28.3 pg (ref 26.0–34.0)
MCHC: 31.4 g/dL (ref 30.0–36.0)
MCV: 90.2 fL (ref 80.0–100.0)
Monocytes Absolute: 0.9 K/uL (ref 0.1–1.0)
Monocytes Relative: 8 %
Neutro Abs: 8.1 K/uL — ABNORMAL HIGH (ref 1.7–7.7)
Neutrophils Relative %: 76 %
Platelets: 267 K/uL (ref 150–400)
RBC: 4.27 MIL/uL (ref 3.87–5.11)
RDW: 13.3 % (ref 11.5–15.5)
WBC: 10.8 K/uL — ABNORMAL HIGH (ref 4.0–10.5)
nRBC: 0 % (ref 0.0–0.2)

## 2024-11-13 LAB — COMPREHENSIVE METABOLIC PANEL WITH GFR
ALT: 15 U/L (ref 0–44)
AST: 27 U/L (ref 15–41)
Albumin: 4.4 g/dL (ref 3.5–5.0)
Alkaline Phosphatase: 135 U/L — ABNORMAL HIGH (ref 38–126)
Anion gap: 10 (ref 5–15)
BUN: 33 mg/dL — ABNORMAL HIGH (ref 8–23)
CO2: 26 mmol/L (ref 22–32)
Calcium: 9.7 mg/dL (ref 8.9–10.3)
Chloride: 104 mmol/L (ref 98–111)
Creatinine, Ser: 1.09 mg/dL — ABNORMAL HIGH (ref 0.44–1.00)
GFR, Estimated: 51 mL/min — ABNORMAL LOW (ref >60)
Glucose, Bld: 105 mg/dL — ABNORMAL HIGH (ref 70–99)
Potassium: 5 mmol/L (ref 3.5–5.1)
Sodium: 141 mmol/L (ref 135–145)
Total Bilirubin: 0.3 mg/dL (ref 0.0–1.2)
Total Protein: 7.4 g/dL (ref 6.5–8.1)

## 2024-11-15 DIAGNOSIS — R760 Raised antibody titer: Secondary | ICD-10-CM | POA: Diagnosis not present

## 2024-11-15 DIAGNOSIS — R809 Proteinuria, unspecified: Secondary | ICD-10-CM | POA: Diagnosis not present

## 2024-11-15 DIAGNOSIS — D631 Anemia in chronic kidney disease: Secondary | ICD-10-CM | POA: Diagnosis not present

## 2024-11-15 DIAGNOSIS — N189 Chronic kidney disease, unspecified: Secondary | ICD-10-CM | POA: Diagnosis not present

## 2024-11-16 DIAGNOSIS — H353211 Exudative age-related macular degeneration, right eye, with active choroidal neovascularization: Secondary | ICD-10-CM | POA: Diagnosis not present

## 2024-11-16 DIAGNOSIS — H35373 Puckering of macula, bilateral: Secondary | ICD-10-CM | POA: Diagnosis not present

## 2024-11-16 DIAGNOSIS — H353122 Nonexudative age-related macular degeneration, left eye, intermediate dry stage: Secondary | ICD-10-CM | POA: Diagnosis not present

## 2024-11-16 DIAGNOSIS — H2513 Age-related nuclear cataract, bilateral: Secondary | ICD-10-CM | POA: Diagnosis not present

## 2024-11-16 DIAGNOSIS — H43813 Vitreous degeneration, bilateral: Secondary | ICD-10-CM | POA: Diagnosis not present

## 2024-11-17 LAB — BCR-ABL1, CML/ALL, PCR, QUANT
E1A2 Transcript: <0.0032 %
Interpretation (BCRAL):: NEGATIVE
b2a2 transcript: <0.0032 %
b3a2 transcript: <0.0032 %

## 2024-11-19 ENCOUNTER — Inpatient Hospital Stay

## 2024-11-22 ENCOUNTER — Ambulatory Visit: Admitting: Family Medicine

## 2024-11-28 ENCOUNTER — Encounter: Payer: Self-pay | Admitting: Family Medicine

## 2024-11-28 ENCOUNTER — Ambulatory Visit: Admitting: Family Medicine

## 2024-11-28 VITALS — BP 156/81 | HR 92 | Temp 96.5°F | Ht 65.0 in | Wt 213.2 lb

## 2024-11-28 DIAGNOSIS — F5102 Adjustment insomnia: Secondary | ICD-10-CM

## 2024-11-28 DIAGNOSIS — F320 Major depressive disorder, single episode, mild: Secondary | ICD-10-CM

## 2024-11-28 DIAGNOSIS — Z23 Encounter for immunization: Secondary | ICD-10-CM

## 2024-11-28 DIAGNOSIS — F411 Generalized anxiety disorder: Secondary | ICD-10-CM

## 2024-11-28 DIAGNOSIS — F4320 Adjustment disorder, unspecified: Secondary | ICD-10-CM | POA: Diagnosis not present

## 2024-11-28 DIAGNOSIS — F439 Reaction to severe stress, unspecified: Secondary | ICD-10-CM | POA: Diagnosis not present

## 2024-11-28 MED ORDER — DESVENLAFAXINE SUCCINATE ER 50 MG PO TB24: 50.0000 mg | ORAL_TABLET | Freq: Every day | ORAL | 3 refills | Status: AC

## 2024-11-28 MED ORDER — HYDROXYZINE PAMOATE 25 MG PO CAPS: 25.0000 mg | ORAL_CAPSULE | Freq: Every evening | ORAL | 0 refills | Status: AC | PRN

## 2024-11-28 NOTE — Progress Notes (Signed)
 Subjective:  Patient ID: Andrea Martinez, female    DOB: 1946-06-28, 78 y.o.   MRN: [id number]  Patient Care Team: Severa Rock HERO, FNP as PCP - General (Family Medicine) Lenis Ethelle ORN, MD as Consulting Physician (Endocrinology) Burnetta Aures, MD as Consulting Physician (Orthopedic Surgery)   Chief Complaint:  Depression (Husband passed away last week )   HPI: Andrea Martinez is a 78 y.o. female presenting on 11/28/2024 for Depression (Husband passed away last week )   Andrea Martinez is a 78 year old female who presents with insomnia and grief following the recent death of her husband.  She is experiencing significant insomnia, primarily taking short naps and not achieving restful sleep. This has been exacerbated by the recent death of her husband, to whom she was married for 60 years. She feels like 'half of me is gone' and is struggling with the emotional impact of her loss.  She has a history of leukemia, diagnosed nine years ago, which is currently in remission. She stopped chemotherapy and continues to have monthly lab work to monitor her condition. She expresses feelings of guilt regarding her husband's illness and passing, stating 'it should have been me.'  She is currently taking Pristiq  (desvenlafaxine ) for anxiety and depression. Previous medication for muscle pain was too strong and made her sick, so she discontinued it. She attributes some of her muscle pain to stress and physical exertion while caring for her husband.  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 Her social history includes significant stress related to her husband's frequent hospitalizations and the financial burden of medical bills. She has been in contact with chaplains for support and is considering resuming counseling, which she had attended prior to her husband's illness.           11/28/2024   12:09 PM 10/26/2024    2:09 PM 10/04/2024    2:04 PM  09/27/2024    2:21 PM 08/15/2024    2:04 PM  Depression screen PHQ 2/9  Decreased Interest 1 1 1  0 0  Down, Depressed, Hopeless 1 0 0 0 0  PHQ - 2 Score 2 1 1  0 0  Altered sleeping 1 1 1     Tired, decreased energy 1 1 1     Change in appetite 1 0 1    Feeling bad or failure about yourself  0 0 0    Trouble concentrating 0 1 0    Moving slowly or fidgety/restless 0 0 0    Suicidal thoughts 0 0 0    PHQ-9 Score 5 4 4      Difficult doing work/chores Not difficult at all Not difficult at all Not difficult at all       Data saved with a previous flowsheet row definition      11/28/2024   12:10 PM 10/26/2024    2:09 PM 04/20/2024   11:25 AM 02/02/2024   11:02 AM  GAD 7 : Generalized Anxiety Score  Nervous, Anxious, on Edge 1 0 0 0  Control/stop worrying 1 0 1 0  Worry too much - different things 1 1 1  0  Trouble relaxing 1 0 0 0  Restless 0 0 0 0  Easily annoyed or irritable 0 0 0 0  Afraid - awful might happen 1 0 0 0  Total GAD 7 Score 5 1 2  0  Anxiety Difficulty Not difficult at all Not difficult at all Not difficult  at all Not difficult at all      Relevant past medical, surgical, family, and social history reviewed and updated as indicated.  Allergies and medications reviewed and updated. Data reviewed: Chart in Epic.   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 Mirna   Lipoma    left upper arm   Mixed hyperlipidemia    Osteoporosis    Pericardial effusion    a. HCAP complicated by pericardial effusion requiring pericardial window 10/2016 and large L pleural effusion requring VATS.   Pleural effusion    a. s/p VATS 2017.   Pneumonia 10/2016   PONV (postoperative nausea and vomiting)    history of   Prolonged QT interval    Urine incontinence     UTI (lower urinary tract infection)    frequent   Vitamin D  deficiency     Past Surgical History:  Procedure Laterality Date   BIOPSY  03/20/2022   Procedure: BIOPSY;  Surgeon: Cindie Carlin POUR, DO;  [facility];  Service: Endoscopy;;  gastric lesion vs. gastric ulcer   BIOPSY  06/28/2022   Procedure: BIOPSY;  Surgeon: Cindie Carlin POUR, DO;  [facility];  Service: Endoscopy;;   BIOPSY  05/10/2023   Procedure: BIOPSY;  Surgeon: Cindie Carlin POUR, DO;  [facility];  Service: Endoscopy;;   BRAVO PH STUDY  12/04/2012   Procedure: BRAVO PH STUDY;  Surgeon: Margo LITTIE Haddock, MD;  [facility]: [facility];  Service: Endoscopy;  Laterality: N/A;   CHOLECYSTECTOMY N/A 01/03/2014   Procedure: LAPAROSCOPIC CHOLECYSTECTOMY WITH INTRAOPERATIVE CHOLANGIOGRAM;  Surgeon: Debby LABOR. Cornett, MD;  Location: [facility];  Service: General;  Laterality: N/A;   COLONOSCOPY  01/2004   [facility], few small tics   COLONOSCOPY WITH PROPOFOL  N/A 05/10/2023   Procedure: COLONOSCOPY WITH PROPOFOL ;  Surgeon: Cindie Carlin POUR, DO;  [facility];  Service: Endoscopy;  Laterality: N/A;  1245pm, asa 3   ESOPHAGOGASTRODUODENOSCOPY  02/2010   gastric ulcers   ESOPHAGOGASTRODUODENOSCOPY  12/04/2012   DOQ:[id number] gastritis (inflammation) was found in the gastric antrum; multiple biopsies The duodenal mucosa showed no abnormalities in the bulb and second portion of the duodenum   ESOPHAGOGASTRODUODENOSCOPY (EGD) WITH PROPOFOL  N/A 03/20/2022   Procedure: ESOPHAGOGASTRODUODENOSCOPY (EGD) WITH PROPOFOL ;  Surgeon: Cindie Carlin POUR, DO;  [facility];  Service: Endoscopy;  Laterality: N/A;   ESOPHAGOGASTRODUODENOSCOPY (EGD) WITH PROPOFOL  N/A 06/28/2022   Procedure: ESOPHAGOGASTRODUODENOSCOPY (EGD) WITH PROPOFOL ;  Surgeon: Cindie Carlin POUR, DO;  [facility];  Service: Endoscopy;  Laterality: N/A;  800   ESOPHAGOGASTRODUODENOSCOPY (EGD) WITH  PROPOFOL  N/A 05/10/2023   Procedure: ESOPHAGOGASTRODUODENOSCOPY (EGD) WITH PROPOFOL ;  Surgeon: Cindie Carlin POUR, DO;  [facility];  Service: Endoscopy;  Laterality: N/A;   JOINT REPLACEMENT Right 10/22/2019   KYPHOPLASTY N/A 02/14/2019   Procedure: KYPHOPLASTY T12 and L1;  Surgeon: Burnetta Aures, MD;  Location: [facility];  Service: Orthopedics;  Laterality: N/A;  120 mins   LASIK     LIPOMA EXCISION  08/02/2011   left shoulder   NOSE SURGERY     PARTIAL HYSTERECTOMY     vaginal at age 58 years of age   POLYPECTOMY  05/10/2023   Procedure: POLYPECTOMY;  Surgeon: Cindie Carlin POUR, DO;  [facility];  Service: Endoscopy;;   TOE DEBRIDEMENT Left 1962   lt great toe   TOTAL HIP ARTHROPLASTY Right 11/18/2021   Procedure: TOTAL HIP ARTHROPLASTY ANTERIOR APPROACH;  Surgeon: Melodi Lerner, MD;  Location: [facility];  Service: Orthopedics;  Laterality: Right;   TOTAL HIP ARTHROPLASTY Left 06/29/2023   Procedure: LEFT TOTAL HIP ARTHROPLASTY ANTERIOR APPROACH;  Surgeon: Melodi Lerner, MD;  Location: [facility];  Service: Orthopedics;  Laterality: Left;   TOTAL KNEE ARTHROPLASTY Right 10/22/2019   Procedure: TOTAL KNEE ARTHROPLASTY;  Surgeon: Melodi Lerner, MD;  Location: [facility];  Service: Orthopedics;  Laterality: Right;    TOTAL KNEE ARTHROPLASTY Left 03/03/2020   Procedure: TOTAL KNEE ARTHROPLASTY;  Surgeon: Melodi Lerner, MD;  Location: [facility];  Service: Orthopedics;  Laterality: Left;    TRANSFORAMINAL LUMBAR INTERBODY FUSION (TLIF) WITH PEDICLE SCREW FIXATION 1 LEVEL N/A 01/28/2021   Procedure: TRANSFORAMINAL LUMBAR INTERBODY FUSION (TLIF) LUMBAR FIVE-SACRAL ONE;  Surgeon: Burnetta Aures, MD;  Location: [facility];  Service: Orthopedics;  Laterality: N/A;  4 hrs   VIDEO ASSISTED THORACOSCOPY (VATS)/EMPYEMA Left 10/25/2016   Procedure: VIDEO ASSISTED THORACOSCOPY (VATS), BRONCH,DRAINAGE OF PLEURAL EFFUSION,PERICARDIAL WINDOW WITH DRAINAGE OF PERICARDIAL FLUID, TEE;  Surgeon:  Elspeth JAYSON Millers, MD;  Location: [facility];  Service: Thoracic;  Laterality: Left;   VIDEO BRONCHOSCOPY N/A 10/25/2016   Procedure: VIDEO BRONCHOSCOPY;  Surgeon: Elspeth JAYSON Millers, MD;  Location: [facility];  Service: Thoracic;  Laterality: N/A;    Social History   Socioeconomic History   Marital status: Married    Spouse name: Zachary   Number of children: 2   Years of education: 12th grade   Highest education level: Not on file  Occupational History   Occupation: retired    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   2 granddaughters.   Retired HR.    Social Drivers of Health   Tobacco Use: Low Risk (11/28/2024)   Patient History    Smoking Tobacco Use: Never    Smokeless Tobacco Use: Never    Passive Exposure: Never  Financial Resource Strain: Low Risk (05/23/2024)   Overall Financial Resource Strain (CARDIA)    Difficulty of Paying Living Expenses: Not hard at all  Food Insecurity: No Food Insecurity (05/23/2024)   Hunger Vital Sign    Worried About Running Out of Food in the Last Year: Never true    Ran Out of Food in the Last Year: Never true  Transportation Needs: No Transportation Needs (05/23/2024)   PRAPARE - Administrator, Civil Service (Medical): No    Lack of Transportation (Non-Medical): No  Physical Activity: Sufficiently Active (05/23/2024)   Exercise Vital Sign    Days of Exercise per Week: 7 days    Minutes of Exercise per Session: 30 min  Stress: Stress Concern Present (05/23/2024)   [employer] of Occupational Health - Occupational  Stress Questionnaire  Feeling of Stress : Very much  Social Connections: Moderately Isolated (05/23/2024)   Social Connection and Isolation Panel    Frequency of Communication with Friends and Family: More than three times a week    Frequency of Social Gatherings with Friends and Family: More than three times a week    Attends Religious Services: Never    Database Administrator or Organizations: No    Attends Banker Meetings: Never    Marital Status: Married  Catering Manager Violence: Not At Risk (05/23/2024)   Humiliation, Afraid, Rape, and Kick questionnaire    Fear of Current or Ex-Partner: No    Emotionally Abused: No    Physically Abused: No    Sexually Abused: No  Depression (PHQ2-9): Medium Risk (11/28/2024)   Depression (PHQ2-9)    PHQ-2 Score: 5  Alcohol Screen: Low Risk (05/23/2024)   Alcohol Screen    Last Alcohol Screening Score (AUDIT): 0  Housing: Unknown (05/23/2024)   Housing Stability Vital Sign    Unable to Pay for Housing in the Last Year: No    Number of Times Moved in the Last Year: Not on file    Homeless in the Last Year: No  Utilities: Not At Risk (05/23/2024)   AHC Utilities    Threatened with loss of utilities: No  Health Literacy: Adequate Health Literacy (05/23/2024)   B1300 Health Literacy    Frequency of need for help with medical instructions: Never    Outpatient Encounter Medications as of 11/28/2024  Medication Sig   Acetaminophen  (ACETAMIN PO) Take by mouth as needed.   albuterol  (VENTOLIN  HFA) 108 (90 Base) MCG/ACT inhaler Inhale 2 puffs into the lungs every 6 (six) hours as needed for wheezing or shortness of breath.   allopurinol (ZYLOPRIM) 100 MG tablet Take 100 mg by mouth daily.   cetirizine (ZYRTEC) 10 MG tablet Take 10 mg by mouth daily.   cholecalciferol  (VITAMIN D3) 25 MCG (1000 UNIT) tablet Take 4,000 Units by mouth daily.   hydrOXYzine  (VISTARIL ) 25 MG capsule Take 1 capsule (25 mg total) by mouth at bedtime as needed  and may repeat dose one time if needed (sleep).   lactose free nutrition (BOOST) LIQD Take 237 mLs by mouth 3 (three) times daily between meals.   levothyroxine  (SYNTHROID ) 75 MCG tablet TAKE 1 TABLET BY MOUTH DAILY BEFORE BREAKFAST.   mirabegron  ER (MYRBETRIQ ) 25 MG TB24 tablet Take 1 tablet (25 mg total) by mouth daily.   Multiple Vitamins-Minerals (MULTIVITAMIN WOMEN PO) Take 1 tablet by mouth daily.   nilotinib  (TASIGNA ) 150 MG capsule Take 2 capsules (300 mg total) by mouth every 12 (twelve) hours.   ondansetron  (ZOFRAN ) 4 MG tablet Take 1 tablet (4 mg total) by mouth every 8 (eight) hours as needed for nausea or vomiting.   Probiotic Product (DIGESTIVE ADV PREBIOT+PROBIOT) CHEW Chew 2 each by mouth daily.   rosuvastatin  (CRESTOR ) 5 MG tablet Take 1 tablet (5 mg total) by mouth at bedtime.   traMADol  (ULTRAM ) 50 MG tablet Take 1 tablet (50 mg total) by mouth at bedtime.   [DISCONTINUED] cyclobenzaprine  (FLEXERIL ) 5 MG tablet Take 1 tablet (5 mg total) by mouth 3 (three) times daily as needed for muscle spasms.   [DISCONTINUED] desvenlafaxine  (PRISTIQ ) 50 MG 24 hr tablet Take 1 tablet (50 mg total) by mouth daily.   desvenlafaxine  (PRISTIQ ) 50 MG 24 hr tablet Take 1 tablet (50 mg total) by mouth daily.   [DISCONTINUED] amoxicillin -clavulanate (AUGMENTIN ) 875-125 MG tablet Take 1  tablet by mouth 2 (two) times daily.   [DISCONTINUED] Influenza vac split trivalent PF (FLUZONE HIGH-DOSE) injection 0.5 mL    No facility-administered encounter medications on file as of 11/28/2024.    Allergies[1]  Pertinent ROS per HPI, otherwise unremarkable      Objective:  BP (!) 156/81   Pulse 92   Temp (!) 96.5 F (35.8 C)   Ht 5' 5 (1.651 m)   Wt 213 lb 3.2 oz (96.7 kg)   SpO2 98%   BMI 35.48 kg/m    Wt Readings from Last 3 Encounters:  11/28/24 213 lb 3.2 oz (96.7 kg)  10/26/24 209 lb (94.8 kg)  10/11/24 213 lb 3.2 oz (96.7 kg)    Physical Exam Vitals and nursing note reviewed.   Constitutional:      General: She is not in acute distress.    Appearance: She is obese. She is ill-appearing (chronically). She is not toxic-appearing or diaphoretic.  HENT:     Mouth/Throat:     Mouth: Mucous membranes are moist.  Eyes:     Pupils: Pupils are equal, round, and reactive to light.  Cardiovascular:     Rate and Rhythm: Normal rate and regular rhythm.     Heart sounds: Normal heart sounds.  Pulmonary:     Effort: Pulmonary effort is normal.     Breath sounds: Normal breath sounds.  Skin:    General: Skin is warm.     Capillary Refill: Capillary refill takes less than 2 seconds.  Neurological:     General: No focal deficit present.     Mental Status: She is alert and oriented to person, place, and time.  Psychiatric:        Attention and Perception: Attention and perception normal.        Mood and Affect: Mood is depressed. Affect is tearful.        Speech: Speech normal.        Behavior: Behavior normal. Behavior is cooperative.        Thought Content: Thought content normal.        Judgment: Judgment normal.       Results for orders placed or performed in visit on 11/13/24  BCR-ABL1, CML/ALL, PCR, QUANT   Collection Time: 11/13/24  9:51 AM  Result Value Ref Range   b2a2 transcript <0.0032 %   b3a2 transcript <0.0032 %   E1A2 Transcript <0.0032 %   Interpretation (BCRAL): Negative    Director Review [facility]): Comment    Background: Comment    Methodology Comment    References (BCRAL) Comment   Comprehensive metabolic panel with GFR   Collection Time: 11/13/24  9:51 AM  Result Value Ref Range   Sodium 141 135 - 145 mmol/L   Potassium 5.0 3.5 - 5.1 mmol/L   Chloride 104 98 - 111 mmol/L   CO2 26 22 - 32 mmol/L   Glucose, Bld 105 (H) 70 - 99 mg/dL   BUN 33 (H) 8 - 23 mg/dL   Creatinine, Ser 8.90 (H) 0.44 - 1.00 mg/dL   Calcium  9.7 8.9 - 10.3 mg/dL   Total Protein 7.4 6.5 - 8.1 g/dL   Albumin 4.4 3.5 - 5.0 g/dL   AST 27 15 - 41 U/L   ALT 15 0 - 44  U/L   Alkaline Phosphatase 135 (H) 38 - 126 U/L   Total Bilirubin 0.3 0.0 - 1.2 mg/dL   GFR, Estimated 51 (L) >60 mL/min   Anion gap 10 5 -  15  CBC with Differential/Platelet   Collection Time: 11/13/24  9:51 AM  Result Value Ref Range   WBC 10.8 (H) 4.0 - 10.5 K/uL   RBC 4.27 3.87 - 5.11 MIL/uL   Hemoglobin 12.1 12.0 - 15.0 g/dL   HCT 61.4 63.9 - 53.9 %   MCV 90.2 80.0 - 100.0 fL   MCH 28.3 26.0 - 34.0 pg   MCHC 31.4 30.0 - 36.0 g/dL   RDW 86.6 88.4 - 84.4 %   Platelets 267 150 - 400 K/uL   nRBC 0.0 0.0 - 0.2 %   Neutrophils Relative % 76 %   Neutro Abs 8.1 (H) 1.7 - 7.7 K/uL   Lymphocytes Relative 14 %   Lymphs Abs 1.5 0.7 - 4.0 K/uL   Monocytes Relative 8 %   Monocytes Absolute 0.9 0.1 - 1.0 K/uL   Eosinophils Relative 2 %   Eosinophils Absolute 0.2 0.0 - 0.5 K/uL   Basophils Relative 0 %   Basophils Absolute 0.0 0.0 - 0.1 K/uL   Immature Granulocytes 0 %   Abs Immature Granulocytes 0.04 0.00 - 0.07 K/uL   *Note: Due to a large number of results and/or encounters for the requested time period, some results have not been displayed. A complete set of results can be found in Results Review.       Pertinent labs & imaging results that were available during my care of the patient were reviewed by me and considered in my medical decision making.  Assessment & Plan:  Andrea Martinez was seen today for depression.  Diagnoses and all orders for this visit:  Grief reaction -     desvenlafaxine  (PRISTIQ ) 50 MG 24 hr tablet; Take 1 tablet (50 mg total) by mouth daily.  Situational stress -     desvenlafaxine  (PRISTIQ ) 50 MG 24 hr tablet; Take 1 tablet (50 mg total) by mouth daily.  Major depressive disorder, single episode, mild degree -     desvenlafaxine  (PRISTIQ ) 50 MG 24 hr tablet; Take 1 tablet (50 mg total) by mouth daily.  GAD (generalized anxiety disorder) -     desvenlafaxine  (PRISTIQ ) 50 MG 24 hr tablet; Take 1 tablet (50 mg total) by mouth daily.  Adjustment  insomnia -     hydrOXYzine  (VISTARIL ) 25 MG capsule; Take 1 capsule (25 mg total) by mouth at bedtime as needed and may repeat dose one time if needed (sleep).  Encounter for immunization -     Flu vaccine HIGH DOSE PF(Fluzone Trivalent)      Adjustment insomnia Insomnia likely related to recent bereavement and stress. Reports difficulty sleeping, taking catnaps, and not sleeping at all at times. Hydroxyzine  chosen for its sedative properties and minimal risk of dependency. - Started hydroxyzine  at bedtime for sleep. - Instructed to take an additional dose if not asleep within 30-45 minutes, with caution regarding drowsiness.  Major depressive disorder and generalized anxiety disorder Continues to take medication for anxiety and depression. No desire for stronger medications. - Continue current medication for anxiety and depression.  Grief reaction Experiencing grief following the recent death of her husband. Expresses feelings of loss and guilt. Engaged in discussions with chaplains and considering counseling. Open to future counseling but not ready to start immediately. - Offered referral to counselor if desired in the future. - Encouraged continued engagement with chaplains for support.  Leukemia in remission Leukemia remains in remission. Continues monthly follow-ups with oncologist for lab work as per previous recommendations. Hesitant to continue but  acknowledges husband's wishes. - Continue monthly follow-ups with oncologist for lab work.        I spent 45 minutes dedicated to the care of this patient on the date of this encounter to include pre-visit review of records including diagnostic studies and labs, face-to-face time with the patient discussing above conditions, post visit ordering of testing, clinical documentation in the electronic health record, making appropriate referrals as documented, and communicating necessary information to the patient's healthcare team.     Continue all other maintenance medications.  Follow up plan: Return in about 4 weeks (around 12/26/2024), or if symptoms worsen or fail to improve, for Depression, Anxiety.   Continue healthy lifestyle choices, including diet (rich in fruits, vegetables, and lean proteins, and low in salt and simple carbohydrates) and exercise (at least 30 minutes of moderate physical activity daily).  Educational handout given for depression   The above assessment and management plan was discussed with the patient. The patient verbalized understanding of and has agreed to the management plan. Patient is aware to call the clinic if they develop any new symptoms or if symptoms persist or worsen. Patient is aware when to return to the clinic for a follow-up visit. Patient educated on when it is appropriate to go to the emergency department.   Rosaline Bruns, FNP-C [facility] [phone]     [1]  Allergies Allergen Reactions   Nucynta [Tapentadol Hcl] Swelling   Codeine Nausea Only and Other (See Comments)    Headache   Sulfonamide Derivatives Nausea Only and Other (See Comments)    Headache   Nexium [Esomeprazole Magnesium ] Nausea Only   Sulfa Antibiotics Nausea Only    Headache    Fluconazole Rash    Redness and blistering on left thigh

## 2024-11-28 NOTE — Patient Instructions (Addendum)
 If your symptoms worsen or you have thoughts of suicide/homicide, PLEASE SEEK IMMEDIATE MEDICAL ATTENTION.  You may always call the National Suicide Hotline.  This is available 24 hours a day, 7 days a week.  Their number is: 415 699 8926  Taking the medicine as directed and not missing any doses is one of the best things you can do to treat your depression.  Here are some things to keep in mind:  Side effects (stomach upset, some increased anxiety/depression, or suicidal thoughts) may happen before you notice a benefit. These side effects typically go away over time. If you have any suicidal thoughts, you need to stop the medications and be evaluated by a medical provider.  Changes to your dose of medicine or a change in medication all together is sometimes necessary Most people need to be on medication at least 6 months Many people will notice an improvement within two weeks but the full effect of the medication can take up to 4-6 weeks Stopping the medication when you start feeling better often results in a return of symptoms Never discontinue your medication without contacting a health care professional first.  Some medications require gradual discontinuation/ taper and can make you sick if you stop them abruptly.  If your symptoms worsen or you have thoughts of suicide/homicide, PLEASE SEEK IMMEDIATE MEDICAL ATTENTION.  You may always call:  National Suicide Hotline: 9393857068 [place] Crisis Line: (609)041-2692 Crisis Recovery in [place]: [phone]  Cell Phone 988  These are available 24 hours a day, 7 days a week.

## 2024-12-02 NOTE — Progress Notes (Unsigned)
 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 female was transferred to my care after her prior physician has left.   ASSESSMENT & PLAN:   Assessment & Plan: Andrea Martinez  is a 78 y.o. 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 Was on nilotinib  all these years and was discontinued in June 2025. Patient took it for few days in August as she was symptomatic.   - Recommended patient to hold nilotinib  at this time as patient has been in remission for several years - Will closely monitor BCR ABL.  Recommendations to do monthly BCR-ABL checks for the first 1 year followed by every 3 months. -Repeat labs monthly   Return to clinic in 2 months for follow-up.  Hyperthyroidism Latest TSH: 0.204 Asymptomatic   - Being managed by Dr.Nida (endocrinology)  Elevated ANA level Patient has elevated ANA with a ratio of 1:640 She is symptomatic with joint pains and unable to bend her fingers X-rays of hand consistent with osteoarthritis findings   - Patient cannot take NSAIDs because of chronic kidney disease - Pain is not controlled with Tylenol  - Will prescribe tramadol  to be taken twice daily as needed -Patient has an upcoming rheumatology appointment on Monday  Insomnia Insomnia persists, potentially exacerbated by stress. Current medication for pain at bedtime and melatonin are ineffective. She prefers to avoid hypnotics and reports weight gain from antidepressant.  Encounter for influenza vaccination Influenza vaccination is due. -  Administer flu shot today.  Anxiety Patient was very anxious and tearful in the clinic today.  Reports that she is undergoing stressful situations with her joint pains and also her husband's new metastatic malignancy diagnosis.  -Emotional support provided during this visit. - Patient already taking antidepressants and refused to take anxiolytics at this time.   The patient understands the plans discussed today and is in agreement with them.  She knows to contact our office if she develops concerns prior to her next appointment.  The total time spent in the appointment was 40 minutes for the encounter  with patient, including review of chart and various tests results, discussions about plan of care and coordination of care plan   Andrea Dry, MD  [facility] [address] [place] - A DEPT OF Andrea Martinez [facility] [address] [place] [address] Dept: [phone] Dept Fax: [phone]   No orders of the defined types were placed in this encounter.    ONCOLOGY HISTORY:   I have reviewed her chart and materials related to her cancer extensively and collaborated history with the patient. Summary of oncologic history is as follows:  CML-Chronic phase:  -11/20/2015: Presented for evaluation with a WBC of 170000 with labsts, promyelocytes, myelocytes and NRBCs. 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  -10/23/2016-11/05/2016: Hospital admission for large left pleural effusion and pericardial  effusion -12/06/2016-03/07/2024: Nilotinib  300mg  every 12 hours(Restarted).  -07/03/2018- Current: BCR ABL: Negative  Current Treatment:  Surveillance  INTERVAL HISTORY:   Discussed the use of AI scribe  software for clinical note transcription with the patient, who gave verbal consent to proceed.  History of Present Illness   I have reviewed the past medical history, past surgical history, social history and family history with the patient and they are unchanged from previous note.  ALLERGIES:  is allergic to nucynta [tapentadol hcl], codeine, sulfonamide derivatives, nexium [esomeprazole magnesium ], sulfa antibiotics, and fluconazole.  MEDICATIONS:  Current Outpatient Medications  Medication Sig Dispense Refill   Acetaminophen  (ACETAMIN PO) Take by mouth as needed.     albuterol  (VENTOLIN  HFA) 108 (90 Base) MCG/ACT inhaler Inhale 2 puffs into the lungs every 6 (six) hours as needed for wheezing or shortness of breath. 8 g 0   allopurinol (ZYLOPRIM) 100 MG tablet Take 100 mg by mouth daily.     cetirizine (ZYRTEC) 10 MG tablet Take 10 mg by mouth daily.     cholecalciferol  (VITAMIN D3) 25 MCG (1000 UNIT) tablet Take 4,000 Units by mouth daily.     desvenlafaxine  (PRISTIQ ) 50 MG 24 hr tablet Take 1 tablet (50 mg total) by mouth daily. 90 tablet 3   hydrOXYzine  (VISTARIL ) 25 MG capsule Take 1 capsule (25 mg total) by mouth at bedtime as needed and may repeat dose one time if needed (sleep). 60 capsule 0   lactose free nutrition (BOOST) LIQD Take 237 mLs by mouth 3 (three) times daily between meals.     levothyroxine  (SYNTHROID ) 75 MCG tablet TAKE 1 TABLET BY MOUTH DAILY BEFORE BREAKFAST. 90 tablet 1   mirabegron  ER (MYRBETRIQ ) 25 MG TB24 tablet Take 1 tablet (25 mg total) by mouth daily. 30 tablet 11   Multiple Vitamins-Minerals (MULTIVITAMIN WOMEN PO) Take 1 tablet by mouth daily.     nilotinib  (TASIGNA ) 150 MG capsule Take 2 capsules (300 mg total) by mouth every 12 (twelve) hours. 120 capsule 2   ondansetron  (ZOFRAN ) 4 MG tablet Take 1 tablet (4 mg total) by mouth every 8 (eight) hours as needed for nausea or vomiting. 60 tablet 3   Probiotic Product (DIGESTIVE ADV PREBIOT+PROBIOT) CHEW  Chew 2 each by mouth daily.     rosuvastatin  (CRESTOR ) 5 MG tablet Take 1 tablet (5 mg total) by mouth at bedtime. 90 tablet 1   traMADol  (ULTRAM ) 50 MG tablet Take 1 tablet (50 mg total) by mouth at bedtime. 30 tablet 0   No current facility-administered medications for this visit.     VITALS:  There were no vitals taken for this visit.  Wt Readings from Last 3 Encounters:  11/28/24 213 lb 3.2 oz (96.7 kg)  10/26/24 209 lb (94.8 kg)  10/11/24 213 lb 3.2 oz (96.7 kg)    There is no height or weight on file to calculate BMI.  Performance status (ECOG): 1 - Symptomatic but completely ambulatory  PHYSICAL EXAM:   GENERAL:alert, no distress and comfortable SKIN: skin color, texture, turgor are normal, no rashes or significant lesions LYMPH:  no palpable lymphadenopathy in the cervical, axillary or inguinal LUNGS: clear to auscultation and percussion with normal breathing effort HEART: regular rate & rhythm and no murmurs and no lower extremity edema ABDOMEN:abdomen soft, non-tender and normal bowel sounds Musculoskeletal:no cyanosis of digits and no clubbing  NEURO: alert & oriented x 3 with fluent speech  LABORATORY DATA:  I have reviewed the data as listed   Lab  Results  Component Value Date   WBC 10.8 (H) 11/13/2024   NEUTROABS 8.1 (H) 11/13/2024   HGB 12.1 11/13/2024   HCT 38.5 11/13/2024   MCV 90.2 11/13/2024   PLT 267 11/13/2024      Chemistry      Component Value Date/Time   NA 141 11/13/2024 0951   NA 140 06/22/2024 1129   K 5.0 11/13/2024 0951   CL 104 11/13/2024 0951   CO2 26 11/13/2024 0951   BUN 33 (H) 11/13/2024 0951   BUN 34 (H) 06/22/2024 1129   CREATININE 1.09 (H) 11/13/2024 0951   CREATININE 0.87 07/14/2011 0938      Component Value Date/Time   CALCIUM  9.7 11/13/2024 0951   ALKPHOS 135 (H) 11/13/2024 0951   AST 27 11/13/2024 0951   ALT 15 11/13/2024 0951   BILITOT 0.3 11/13/2024 0951   BILITOT 0.3 06/22/2024 1129        Latest Reference  Range & Units 11/13/24 09:51  b2a2 transcript % <0.0032  b3a2 transcript % <0.0032  E1A2 Transcript % <0.0032  Interpretation (BCRAL):  Negative    RADIOGRAPHIC STUDIES: I have personally reviewed the radiological images as listed and agreed with the findings in the report.  DG Hand Complete Right CLINICAL DATA:  Bilateral hand pain and swelling.  EXAM: RIGHT HAND - COMPLETE 3+ VIEW  COMPARISON:  None Available.  FINDINGS: There is no evidence of fracture or dislocation. Mild degenerative changes seen involving the first carpometacarpal joint. Soft tissues are unremarkable.  IMPRESSION: Mild osteoarthritis of first carpometacarpal joint. No acute abnormality seen.  Electronically Signed   By: Lynwood Landy Raddle M.D.   On: 06/25/2024 12:55 DG Hand Complete Left CLINICAL DATA:  Bilateral hand pain and swelling.  EXAM: LEFT HAND - COMPLETE 3+ VIEW  COMPARISON:  None Available.  FINDINGS: There is no evidence of fracture or dislocation. Moderate degenerative changes seen involving first carpometacarpal joint. Soft tissues are unremarkable.  IMPRESSION: Moderate osteoarthritis of first carpometacarpal joint. No acute abnormality seen.  Electronically Signed   By: Lynwood Landy Raddle M.D.   On: 06/25/2024 12:54    "

## 2024-12-03 ENCOUNTER — Inpatient Hospital Stay: Admitting: Oncology

## 2024-12-03 ENCOUNTER — Inpatient Hospital Stay

## 2024-12-03 VITALS — BP 138/65 | HR 95 | Temp 97.9°F | Resp 19 | Ht 65.0 in | Wt 210.7 lb

## 2024-12-03 DIAGNOSIS — R7689 Other specified abnormal immunological findings in serum: Secondary | ICD-10-CM

## 2024-12-03 DIAGNOSIS — G4700 Insomnia, unspecified: Secondary | ICD-10-CM

## 2024-12-03 DIAGNOSIS — C9210 Chronic myeloid leukemia, BCR/ABL-positive, not having achieved remission: Secondary | ICD-10-CM

## 2024-12-03 DIAGNOSIS — N390 Urinary tract infection, site not specified: Secondary | ICD-10-CM

## 2024-12-03 DIAGNOSIS — C9211 Chronic myeloid leukemia, BCR/ABL-positive, in remission: Secondary | ICD-10-CM | POA: Diagnosis not present

## 2024-12-03 DIAGNOSIS — E0590 Thyrotoxicosis, unspecified without thyrotoxic crisis or storm: Secondary | ICD-10-CM | POA: Diagnosis not present

## 2024-12-03 LAB — URINALYSIS, COMPLETE (UACMP) WITH MICROSCOPIC
Bilirubin Urine: NEGATIVE
Glucose, UA: NEGATIVE mg/dL
Hgb urine dipstick: NEGATIVE
Ketones, ur: NEGATIVE mg/dL
Nitrite: NEGATIVE
Protein, ur: NEGATIVE mg/dL
Specific Gravity, Urine: 1.023 (ref 1.005–1.030)
pH: 5 (ref 5.0–8.0)

## 2024-12-03 NOTE — Patient Instructions (Signed)
 [facility] at [facility] Discharge Instructions   You were seen and examined today by Dr. Davonna.  She reviewed the results of your lab work which are normal/stable.   We will see you back in 2 months.   Return as scheduled.    Thank you for choosing [facility] at [facility] to provide your oncology and hematology care.  To afford each patient quality time with our provider, please arrive at least 15 minutes before your scheduled appointment time.   If you have a lab appointment with the Cancer Center please come in thru the Main Entrance and check in at the main information desk.  You need to re-schedule your appointment should you arrive 10 or more minutes late.  We strive to give you quality time with our providers, and arriving late affects you and other patients whose appointments are after yours.  Also, if you no show three or more times for appointments you may be dismissed from the clinic at the providers discretion.     Again, thank you for choosing [facility].  Our hope is that these requests will decrease the amount of time that you wait before being seen by our physicians.       _____________________________________________________________  Should you have questions after your visit to [facility], please contact our office at [phone] and follow the prompts.  Our office hours are 8:00 a.m. and 4:30 p.m. Monday - Friday.  Please note that voicemails left after 4:00 p.m. may not be returned until the following business day.  We are closed weekends and major holidays.  You do have access to a nurse 24-7, just call the main number to the clinic [phone] and do not press any options, hold on the line and a nurse will answer the phone.    For prescription refill requests, have your pharmacy contact our office and allow 72 hours.    Due to Covid, you will need to wear a mask upon entering the  hospital. If you do not have a mask, a mask will be given to you at the Main Entrance upon arrival. For doctor visits, patients may have 1 support person age 70 or older with them. For treatment visits, patients can not have anyone with them due to social distancing guidelines and our immunocompromised population.

## 2024-12-04 ENCOUNTER — Other Ambulatory Visit: Payer: Self-pay | Admitting: *Deleted

## 2024-12-04 ENCOUNTER — Ambulatory Visit: Payer: Self-pay | Admitting: *Deleted

## 2024-12-04 DIAGNOSIS — N390 Urinary tract infection, site not specified: Secondary | ICD-10-CM

## 2024-12-04 MED ORDER — NITROFURANTOIN MONOHYD MACRO 100 MG PO CAPS: 100.0000 mg | ORAL_CAPSULE | Freq: Two times a day (BID) | ORAL | 0 refills | Status: AC

## 2024-12-04 NOTE — Progress Notes (Signed)
 Patient made aware and verbalized understanding.  Script sent to preferred pharmacy.

## 2024-12-05 LAB — URINE CULTURE: Culture: 70000 — AB

## 2024-12-10 ENCOUNTER — Encounter: Payer: Self-pay | Admitting: *Deleted

## 2024-12-11 ENCOUNTER — Ambulatory Visit: Admitting: Urology

## 2024-12-19 ENCOUNTER — Telehealth: Payer: Self-pay

## 2024-12-19 ENCOUNTER — Telehealth: Payer: Self-pay | Admitting: Family Medicine

## 2024-12-19 NOTE — Telephone Encounter (Unsigned)
 Copied from CRM #[id number]. Topic: Clinical - Refused Triage >> Dec 19, 2024  2:06 PM Alfonso ORN wrote: Patient/caller voiced complaints of lower back spasm gradually getting worse more pt walk,  the pain comes and goes to rate 3 or 4 up to 7 or  8 have not injured self . Declined transfer to triage.

## 2024-12-19 NOTE — Telephone Encounter (Signed)
 Copied from CRM [id number]. Topic: Clinical - Medical Advice >> Dec 19, 2024  2:08 PM Alfonso ORN wrote: Reason for CRM:  patient requesting an appt with Rakes,Linda M lower back spasm gradually getting worse more pt walk, the pain comes and goes to rate 3 or 4 up to 7 or 8 have not injured self . Declined transfer to triage.

## 2024-12-20 NOTE — Telephone Encounter (Signed)
 Left pt a message that Andrea Martinez is booked up and I can put her on wait list or schedule appt with dod

## 2024-12-25 ENCOUNTER — Encounter: Admitting: Internal Medicine

## 2024-12-31 ENCOUNTER — Inpatient Hospital Stay: Attending: Oncology

## 2024-12-31 DIAGNOSIS — C9210 Chronic myeloid leukemia, BCR/ABL-positive, not having achieved remission: Secondary | ICD-10-CM

## 2024-12-31 DIAGNOSIS — R300 Dysuria: Secondary | ICD-10-CM | POA: Diagnosis not present

## 2024-12-31 DIAGNOSIS — N390 Urinary tract infection, site not specified: Secondary | ICD-10-CM

## 2024-12-31 DIAGNOSIS — R760 Raised antibody titer: Secondary | ICD-10-CM | POA: Insufficient documentation

## 2024-12-31 DIAGNOSIS — C9211 Chronic myeloid leukemia, BCR/ABL-positive, in remission: Secondary | ICD-10-CM | POA: Insufficient documentation

## 2024-12-31 LAB — COMPREHENSIVE METABOLIC PANEL WITH GFR
ALT: 18 U/L (ref 0–44)
AST: 32 U/L (ref 15–41)
Albumin: 4.5 g/dL (ref 3.5–5.0)
Alkaline Phosphatase: 165 U/L — ABNORMAL HIGH (ref 38–126)
Anion gap: 16 — ABNORMAL HIGH (ref 5–15)
BUN: 37 mg/dL — ABNORMAL HIGH (ref 8–23)
CO2: 21 mmol/L — ABNORMAL LOW (ref 22–32)
Calcium: 9.9 mg/dL (ref 8.9–10.3)
Chloride: 104 mmol/L (ref 98–111)
Creatinine, Ser: 0.98 mg/dL (ref 0.44–1.00)
GFR, Estimated: 58 mL/min — ABNORMAL LOW
Glucose, Bld: 99 mg/dL (ref 70–99)
Potassium: 4.3 mmol/L (ref 3.5–5.1)
Sodium: 141 mmol/L (ref 135–145)
Total Bilirubin: 0.5 mg/dL (ref 0.0–1.2)
Total Protein: 7.8 g/dL (ref 6.5–8.1)

## 2024-12-31 LAB — URINALYSIS, ROUTINE W REFLEX MICROSCOPIC
Bilirubin Urine: NEGATIVE
Glucose, UA: NEGATIVE mg/dL
Hgb urine dipstick: NEGATIVE
Ketones, ur: NEGATIVE mg/dL
Nitrite: NEGATIVE
Protein, ur: NEGATIVE mg/dL
Specific Gravity, Urine: 1.025 (ref 1.005–1.030)
WBC, UA: >50 WBC/hpf (ref 0–5)
pH: 5 (ref 5.0–8.0)

## 2024-12-31 LAB — CBC WITH DIFFERENTIAL/PLATELET
Abs Immature Granulocytes: 0.03 K/uL (ref 0.00–0.07)
Basophils Absolute: 0 K/uL (ref 0.0–0.1)
Basophils Relative: 0 %
Eosinophils Absolute: 0.2 K/uL (ref 0.0–0.5)
Eosinophils Relative: 2 %
HCT: 39.1 % (ref 36.0–46.0)
Hemoglobin: 12.4 g/dL (ref 12.0–15.0)
Immature Granulocytes: 0 %
Lymphocytes Relative: 15 %
Lymphs Abs: 1.6 K/uL (ref 0.7–4.0)
MCH: 28.1 pg (ref 26.0–34.0)
MCHC: 31.7 g/dL (ref 30.0–36.0)
MCV: 88.7 fL (ref 80.0–100.0)
Monocytes Absolute: 0.8 K/uL (ref 0.1–1.0)
Monocytes Relative: 7 %
Neutro Abs: 8.5 K/uL — ABNORMAL HIGH (ref 1.7–7.7)
Neutrophils Relative %: 76 %
Platelets: 272 K/uL (ref 150–400)
RBC: 4.41 MIL/uL (ref 3.87–5.11)
RDW: 13.9 % (ref 11.5–15.5)
WBC: 11.2 K/uL — ABNORMAL HIGH (ref 4.0–10.5)
nRBC: 0 % (ref 0.0–0.2)

## 2024-12-31 LAB — LACTATE DEHYDROGENASE: LDH: 191 U/L (ref 105–235)

## 2025-01-01 ENCOUNTER — Encounter: Payer: Self-pay | Admitting: Family Medicine

## 2025-01-01 ENCOUNTER — Ambulatory Visit: Admitting: Family Medicine

## 2025-01-01 VITALS — BP 134/77 | HR 112 | Temp 95.6°F | Ht 65.0 in | Wt 210.0 lb

## 2025-01-01 DIAGNOSIS — F5102 Adjustment insomnia: Secondary | ICD-10-CM | POA: Diagnosis not present

## 2025-01-01 DIAGNOSIS — G8929 Other chronic pain: Secondary | ICD-10-CM | POA: Diagnosis not present

## 2025-01-01 DIAGNOSIS — F439 Reaction to severe stress, unspecified: Secondary | ICD-10-CM | POA: Diagnosis not present

## 2025-01-01 DIAGNOSIS — F411 Generalized anxiety disorder: Secondary | ICD-10-CM

## 2025-01-01 DIAGNOSIS — F4320 Adjustment disorder, unspecified: Secondary | ICD-10-CM

## 2025-01-01 DIAGNOSIS — F320 Major depressive disorder, single episode, mild: Secondary | ICD-10-CM

## 2025-01-01 DIAGNOSIS — M549 Dorsalgia, unspecified: Secondary | ICD-10-CM

## 2025-01-01 MED ORDER — LIDOCAINE 5 % EX PTCH: 1.0000 | MEDICATED_PATCH | CUTANEOUS | 0 refills | Status: AC

## 2025-01-01 NOTE — Progress Notes (Signed)
 "    Subjective:  Patient ID: Andrea Martinez, female    DOB: 08/11/1946, 79 y.o.   MRN: [id number]  Patient Care Team: Severa Rock HERO, FNP as PCP - General (Family Medicine) Lenis Ethelle ORN, MD as Consulting Physician (Endocrinology) Burnetta Aures, MD as Consulting Physician (Orthopedic Surgery)   Chief Complaint:  Anxiety and Depression (4 week follow up )   HPI: Andrea Martinez is a 79 y.o. female presenting on 01/01/2025 for Anxiety and Depression (4 week follow up )   Andrea Martinez is a 79 year old female who presents for follow-up on her treatment for anxiety and insomnia.  She was started on Vistaril  for anxiety and insomnia following the passing of her husband. She took the medication twice a night but found it ineffective, as she only managed to sleep for about 35 to 40 minutes on the second attempt.  She continues to take Pristiq  (desvenlafaxine ) daily for anxiety, which she has been on for some time. She wants to speak with a counselor. She is concerned about insurance coverage but is willing to proceed regardless of the cost.  She mentions the stress of dealing with extensive paperwork and hospital bills following her husband's chemotherapy, with one bill amounting to $119,000. She has not been in contact with her chaplain recently.          Relevant past medical, surgical, family, and social history reviewed and updated as indicated.  Allergies and medications reviewed and updated. Data reviewed: Chart in Epic.   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 Mirna   Lipoma    left upper arm   Mixed hyperlipidemia    Osteoporosis    Pericardial effusion    a. HCAP complicated by pericardial effusion requiring pericardial window  10/2016 and large L pleural effusion requring VATS.   Pleural effusion    a. s/p VATS 2017.   Pneumonia 10/2016   PONV (postoperative nausea and vomiting)    history of   Prolonged QT interval    Urine incontinence    UTI (lower urinary tract infection)    frequent   Vitamin D  deficiency     Past Surgical History:  Procedure Laterality Date   BIOPSY  03/20/2022   Procedure: BIOPSY;  Surgeon: Cindie Carlin POUR, DO;  [facility];  Service: Endoscopy;;  gastric lesion vs. gastric ulcer   BIOPSY  06/28/2022   Procedure: BIOPSY;  Surgeon: Cindie Carlin POUR, DO;  [facility];  Service: Endoscopy;;   BIOPSY  05/10/2023   Procedure: BIOPSY;  Surgeon: Cindie Carlin POUR, DO;  [facility];  Service: Endoscopy;;   BRAVO PH STUDY  12/04/2012   Procedure: BRAVO PH STUDY;  Surgeon: Margo LITTIE Haddock, MD;  [facility];  Service: Endoscopy;  Laterality: N/A;   CHOLECYSTECTOMY N/A 01/03/2014   Procedure: LAPAROSCOPIC CHOLECYSTECTOMY WITH INTRAOPERATIVE CHOLANGIOGRAM;  Surgeon: Debby LABOR. Cornett, MD;  Location: [facility];  Service: General;  Laterality: N/A;   COLONOSCOPY  01/2004   DR [facility], few small tics   COLONOSCOPY WITH PROPOFOL  N/A 05/10/2023   Procedure: COLONOSCOPY WITH PROPOFOL ;  Surgeon: Cindie Carlin POUR, DO;  [facility];  Service: Endoscopy;  Laterality: N/A;  1245pm, asa 3   ESOPHAGOGASTRODUODENOSCOPY  02/2010   gastric ulcers   ESOPHAGOGASTRODUODENOSCOPY  12/04/2012   DOQ:[id number] gastritis (inflammation) was found in the gastric antrum; multiple biopsies The duodenal mucosa showed no abnormalities in the bulb and second portion of the duodenum   ESOPHAGOGASTRODUODENOSCOPY (EGD) WITH PROPOFOL  N/A 03/20/2022   Procedure: ESOPHAGOGASTRODUODENOSCOPY (EGD) WITH PROPOFOL ;  Surgeon: Cindie Carlin POUR, DO;  [facility];  Service: Endoscopy;  Laterality: N/A;   ESOPHAGOGASTRODUODENOSCOPY (EGD) WITH PROPOFOL   N/A 06/28/2022   Procedure: ESOPHAGOGASTRODUODENOSCOPY (EGD) WITH PROPOFOL ;  Surgeon: Cindie Carlin POUR, DO;  [facility];  Service: Endoscopy;  Laterality: N/A;  800   ESOPHAGOGASTRODUODENOSCOPY (EGD) WITH PROPOFOL  N/A 05/10/2023   Procedure: ESOPHAGOGASTRODUODENOSCOPY (EGD) WITH PROPOFOL ;  Surgeon: Cindie Carlin POUR, DO;  [facility];  Service: Endoscopy;  Laterality: N/A;   JOINT REPLACEMENT Right 10/22/2019   KYPHOPLASTY N/A 02/14/2019   Procedure: KYPHOPLASTY T12 and L1;  Surgeon: Burnetta Aures, MD;  Location: [facility];  Service: Orthopedics;  Laterality: N/A;  120 mins   LASIK     LIPOMA EXCISION  08/02/2011   left shoulder   NOSE SURGERY     PARTIAL HYSTERECTOMY     vaginal at age 3 years of age   POLYPECTOMY  05/10/2023   Procedure: POLYPECTOMY;  Surgeon: Cindie Carlin POUR, DO;  [facility];  Service: Endoscopy;;   TOE DEBRIDEMENT Left 1962   lt great toe   TOTAL HIP ARTHROPLASTY Right 11/18/2021   Procedure: TOTAL HIP ARTHROPLASTY ANTERIOR APPROACH;  Surgeon: Melodi Lerner, MD;  Location: [facility];  Service: Orthopedics;  Laterality: Right;   TOTAL HIP ARTHROPLASTY Left 06/29/2023   Procedure: LEFT TOTAL HIP ARTHROPLASTY ANTERIOR APPROACH;  Surgeon: Melodi Lerner, MD;  Location: [facility];  Service: Orthopedics;  Laterality: Left;   TOTAL KNEE ARTHROPLASTY Right 10/22/2019   Procedure: TOTAL KNEE ARTHROPLASTY;  Surgeon: Melodi Lerner, MD;  Location: [facility];  Service: Orthopedics;  Laterality: Right;    TOTAL KNEE ARTHROPLASTY Left 03/03/2020   Procedure: TOTAL KNEE ARTHROPLASTY;  Surgeon: Melodi Lerner, MD;  Location: [facility];  Service: Orthopedics;  Laterality: Left;    TRANSFORAMINAL LUMBAR INTERBODY FUSION (TLIF) WITH PEDICLE SCREW FIXATION 1 LEVEL N/A 01/28/2021   Procedure: TRANSFORAMINAL LUMBAR INTERBODY FUSION (TLIF) LUMBAR FIVE-SACRAL ONE;  Surgeon: Burnetta Aures, MD;  Location: [facility];  Service: Orthopedics;  Laterality: N/A;  4  hrs   VIDEO ASSISTED THORACOSCOPY (VATS)/EMPYEMA Left 10/25/2016   Procedure: VIDEO ASSISTED THORACOSCOPY (VATS), BRONCH,DRAINAGE OF PLEURAL EFFUSION,PERICARDIAL WINDOW WITH DRAINAGE OF PERICARDIAL FLUID, TEE;  Surgeon: Elspeth JAYSON Millers, MD;  Location: [facility];  Service: Thoracic;  Laterality: Left;   VIDEO BRONCHOSCOPY N/A 10/25/2016   Procedure: VIDEO BRONCHOSCOPY;  Surgeon: Elspeth JAYSON Millers, MD;  Location: [facility];  Service: Thoracic;  Laterality: N/A;    Social History   Socioeconomic History   Marital status: Married    Spouse name: Zachary   Number of children: 2   Years of education: 12th grade   Highest education level: Not on file  Occupational History   Occupation: retired    Associate Professor: UENPLOYED    Employer: RETIRED  Tobacco Use   Smoking status: Never    Passive exposure: Never   Smokeless tobacco: Never  Vaping Use   Vaping status: Never Used  Substance and Sexual Activity   Alcohol use: No    Alcohol/week: 0.0 standard drinks of alcohol  Drug use: No   Sexual activity: Yes    Partners: Male    Birth control/protection: None  Other Topics Concern   Not on file  Social History Narrative   1 son, 1 daughter.   2 granddaughters.   Retired HR.    Social Drivers of Health   Tobacco Use: Low Risk (01/01/2025)   Patient History    Smoking Tobacco Use: Never    Smokeless Tobacco Use: Never    Passive Exposure: Never  Financial Resource Strain: Low Risk (05/23/2024)   Overall Financial Resource Strain (CARDIA)    Difficulty of Paying Living Expenses: Not hard at all  Food Insecurity: No Food Insecurity (05/23/2024)   Hunger Vital Sign    Worried About Running Out of Food in the Last Year: Never true    Ran Out of Food in the Last Year: Never true  Transportation Needs: No Transportation Needs (05/23/2024)   PRAPARE - Administrator, Civil Service (Medical): No    Lack of Transportation (Non-Medical): No  Physical Activity: Sufficiently Active  (05/23/2024)   Exercise Vital Sign    Days of Exercise per Week: 7 days    Minutes of Exercise per Session: 30 min  Stress: Stress Concern Present (05/23/2024)   [employer] of Occupational Health - Occupational Stress Questionnaire    Feeling of Stress : Very much  Social Connections: Moderately Isolated (05/23/2024)   Social Connection and Isolation Panel    Frequency of Communication with Friends and Family: More than three times a week    Frequency of Social Gatherings with Friends and Family: More than three times a week    Attends Religious Services: Never    Database Administrator or Organizations: No    Attends Banker Meetings: Never    Marital Status: Married  Catering Manager Violence: Not At Risk (05/23/2024)   Humiliation, Afraid, Rape, and Kick questionnaire    Fear of Current or Ex-Partner: No    Emotionally Abused: No    Physically Abused: No    Sexually Abused: No  Depression (PHQ2-9): Low Risk (12/03/2024)   Depression (PHQ2-9)    PHQ-2 Score: 0  Recent Concern: Depression (PHQ2-9) - Medium Risk (11/28/2024)   Depression (PHQ2-9)    PHQ-2 Score: 5  Alcohol Screen: Low Risk (05/23/2024)   Alcohol Screen    Last Alcohol Screening Score (AUDIT): 0  Housing: Unknown (05/23/2024)   Housing Stability Vital Sign    Unable to Pay for Housing in the Last Year: No    Number of Times Moved in the Last Year: Not on file    Homeless in the Last Year: No  Utilities: Not At Risk (05/23/2024)   AHC Utilities    Threatened with loss of utilities: No  Health Literacy: Adequate Health Literacy (05/23/2024)   B1300 Health Literacy    Frequency of need for help with medical instructions: Never    Outpatient Encounter Medications as of 01/01/2025  Medication Sig   Acetaminophen  (ACETAMIN PO) Take by mouth as needed.   albuterol  (VENTOLIN  HFA) 108 (90 Base) MCG/ACT inhaler Inhale 2 puffs into the lungs every 6 (six) hours as needed for wheezing or shortness of  breath.   allopurinol (ZYLOPRIM) 100 MG tablet Take 100 mg by mouth daily.   cetirizine (ZYRTEC) 10 MG tablet Take 10 mg by mouth daily.   cholecalciferol  (VITAMIN D3) 25 MCG (1000 UNIT) tablet Take 4,000 Units by mouth daily.   desvenlafaxine  (PRISTIQ ) 50 MG 24  hr tablet Take 1 tablet (50 mg total) by mouth daily.   hydrOXYzine  (VISTARIL ) 25 MG capsule Take 1 capsule (25 mg total) by mouth at bedtime as needed and may repeat dose one time if needed (sleep).   lactose free nutrition (BOOST) LIQD Take 237 mLs by mouth 3 (three) times daily between meals.   levothyroxine  (SYNTHROID ) 75 MCG tablet TAKE 1 TABLET BY MOUTH DAILY BEFORE BREAKFAST.   mirabegron  ER (MYRBETRIQ ) 25 MG TB24 tablet Take 1 tablet (25 mg total) by mouth daily.   Multiple Vitamins-Minerals (MULTIVITAMIN WOMEN PO) Take 1 tablet by mouth daily.   nilotinib  (TASIGNA ) 150 MG capsule Take 2 capsules (300 mg total) by mouth every 12 (twelve) hours.   ondansetron  (ZOFRAN ) 4 MG tablet Take 1 tablet (4 mg total) by mouth every 8 (eight) hours as needed for nausea or vomiting.   Probiotic Product (DIGESTIVE ADV PREBIOT+PROBIOT) CHEW Chew 2 each by mouth daily.   rosuvastatin  (CRESTOR ) 5 MG tablet Take 1 tablet (5 mg total) by mouth at bedtime.   traMADol  (ULTRAM ) 50 MG tablet Take 1 tablet (50 mg total) by mouth at bedtime.   lidocaine  (LIDODERM ) 5 % Place 1 patch onto the skin daily. Remove & Discard patch within 12 hours or as directed by MD   No facility-administered encounter medications on file as of 01/01/2025.    Allergies[1]  Pertinent ROS per HPI, otherwise unremarkable      Objective:  BP 134/77   Pulse (!) 112   Temp (!) 95.6 F (35.3 C)   Ht 5' 5 (1.651 m)   Wt 210 lb (95.3 kg)   SpO2 91%   BMI 34.95 kg/m    Wt Readings from Last 3 Encounters:  01/01/25 210 lb (95.3 kg)  12/03/24 210 lb 11.2 oz (95.6 kg)  11/28/24 213 lb 3.2 oz (96.7 kg)    Physical Exam Vitals and nursing note reviewed.   Constitutional:      General: She is not in acute distress.    Appearance: Normal appearance. She is obese. She is not ill-appearing, toxic-appearing or diaphoretic.  HENT:     Head: Normocephalic and atraumatic.     Mouth/Throat:     Mouth: Mucous membranes are moist.  Eyes:     Pupils: Pupils are equal, round, and reactive to light.  Cardiovascular:     Rate and Rhythm: Normal rate and regular rhythm.     Heart sounds: Normal heart sounds.  Pulmonary:     Effort: Pulmonary effort is normal.     Breath sounds: Normal breath sounds.  Skin:    General: Skin is warm and dry.     Capillary Refill: Capillary refill takes less than 2 seconds.  Neurological:     General: No focal deficit present.     Mental Status: She is alert and oriented to person, place, and time.  Psychiatric:        Attention and Perception: Attention and perception normal.        Mood and Affect: Mood and affect normal.        Speech: Speech normal.        Behavior: Behavior normal. Behavior is cooperative.        Thought Content: Thought content normal.        Cognition and Memory: Cognition and memory normal.        Judgment: Judgment normal.       Results for orders placed or performed in visit on 12/31/24  Urinalysis, Routine  w reflex microscopic   Collection Time: 12/31/24  1:48 PM  Result Value Ref Range   Color, Urine YELLOW YELLOW   APPearance CLOUDY (A) CLEAR   Specific Gravity, Urine 1.025 1.005 - 1.030   pH 5.0 5.0 - 8.0   Glucose, UA NEGATIVE NEGATIVE mg/dL   Hgb urine dipstick NEGATIVE NEGATIVE   Bilirubin Urine NEGATIVE NEGATIVE   Ketones, ur NEGATIVE NEGATIVE mg/dL   Protein, ur NEGATIVE NEGATIVE mg/dL   Nitrite NEGATIVE NEGATIVE   Leukocytes,Ua MODERATE (A) NEGATIVE   RBC / HPF 0-5 0 - 5 RBC/hpf   WBC, UA >50 0 - 5 WBC/hpf   Bacteria, UA MANY (A) NONE SEEN   Squamous Epithelial / HPF 11-20 0 - 5 /HPF   Mucus PRESENT   Lactate dehydrogenase   Collection Time: 12/31/24  2:06 PM   Result Value Ref Range   LDH 191 105 - 235 U/L  Comprehensive metabolic panel   Collection Time: 12/31/24  2:06 PM  Result Value Ref Range   Sodium 141 135 - 145 mmol/L   Potassium 4.3 3.5 - 5.1 mmol/L   Chloride 104 98 - 111 mmol/L   CO2 21 (L) 22 - 32 mmol/L   Glucose, Bld 99 70 - 99 mg/dL   BUN 37 (H) 8 - 23 mg/dL   Creatinine, Ser 9.01 0.44 - 1.00 mg/dL   Calcium  9.9 8.9 - 10.3 mg/dL   Total Protein 7.8 6.5 - 8.1 g/dL   Albumin 4.5 3.5 - 5.0 g/dL   AST 32 15 - 41 U/L   ALT 18 0 - 44 U/L   Alkaline Phosphatase 165 (H) 38 - 126 U/L   Total Bilirubin 0.5 0.0 - 1.2 mg/dL   GFR, Estimated 58 (L) >60 mL/min   Anion gap 16 (H) 5 - 15  CBC with Differential   Collection Time: 12/31/24  2:06 PM  Result Value Ref Range   WBC 11.2 (H) 4.0 - 10.5 K/uL   RBC 4.41 3.87 - 5.11 MIL/uL   Hemoglobin 12.4 12.0 - 15.0 g/dL   HCT 60.8 63.9 - 53.9 %   MCV 88.7 80.0 - 100.0 fL   MCH 28.1 26.0 - 34.0 pg   MCHC 31.7 30.0 - 36.0 g/dL   RDW 86.0 88.4 - 84.4 %   Platelets 272 150 - 400 K/uL   nRBC 0.0 0.0 - 0.2 %   Neutrophils Relative % 76 %   Neutro Abs 8.5 (H) 1.7 - 7.7 K/uL   Lymphocytes Relative 15 %   Lymphs Abs 1.6 0.7 - 4.0 K/uL   Monocytes Relative 7 %   Monocytes Absolute 0.8 0.1 - 1.0 K/uL   Eosinophils Relative 2 %   Eosinophils Absolute 0.2 0.0 - 0.5 K/uL   Basophils Relative 0 %   Basophils Absolute 0.0 0.0 - 0.1 K/uL   Immature Granulocytes 0 %   Abs Immature Granulocytes 0.03 0.00 - 0.07 K/uL   *Note: Due to a large number of results and/or encounters for the requested time period, some results have not been displayed. A complete set of results can be found in Results Review.       Pertinent labs & imaging results that were available during my care of the patient were reviewed by me and considered in my medical decision making.  Assessment & Plan:  Marijayne Rauth was seen today for anxiety and depression.  Diagnoses and all orders for this visit:  Major  depressive disorder, single episode, mild degree -  Ambulatory referral to Integrative Medicine  GAD (generalized anxiety disorder) -     Ambulatory referral to Integrative Medicine  Grief reaction -     Ambulatory referral to Integrative Medicine  Situational stress -     Ambulatory referral to Integrative Medicine  Adjustment insomnia -     Ambulatory referral to Integrative Medicine  Chronic mid back pain -     lidocaine  (LIDODERM ) 5 %; Place 1 patch onto the skin daily. Remove & Discard patch within 12 hours or as directed by MD    Patient and/or legal guardian verbally consented to [facility] services about presenting concerns and psychiatric consultation as appropriate.  The services will be billed as appropriate for the patient.     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 Vistaril  was ineffective for insomnia, providing only 35-40 minutes of sleep. Continues Pristiq  (desvenlafaxine ) daily for anxiety management. - Continue Pristiq  (desvenlafaxine ) daily for anxiety management. - Scheduled appointment with counselor Redell for behavioral therapy.  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 Significant financial stress due to medical bills related to husband's chemotherapy. No current engagement with chaplain services. - Scheduled appointment with counselor Redell for behavioral therapy.        Continue all other maintenance medications.  Follow up plan: Return in about 4 months (around 05/01/2025) for chronic follow .   Continue healthy lifestyle choices, including diet (rich in fruits, vegetables, and lean proteins, and low in salt and simple carbohydrates) and exercise (at least 30 minutes of moderate physical activity  daily).  Educational handout given for grief   The above assessment and management plan was discussed with the patient. The patient verbalized understanding of and has agreed to the management plan. Patient is aware to call the clinic if they develop any new symptoms or if symptoms persist or worsen. Patient is aware when to return to the clinic for a follow-up visit. Patient educated on when it is appropriate to go to the emergency department.   Rosaline Bruns, FNP-C [facility] [phone]     [1]  Allergies Allergen Reactions   Nucynta [Tapentadol Hcl] Swelling   Codeine Nausea Only and Other (See Comments)    Headache   Sulfonamide Derivatives Nausea Only and Other (See Comments)    Headache   Nexium [Esomeprazole Magnesium ] Nausea Only   Sulfa Antibiotics Nausea Only    Headache    Fluconazole Rash    Redness and blistering on left thigh   "

## 2025-01-01 NOTE — Addendum Note (Signed)
 Addended by: SEVERA ROSALINE HERO on: 01/01/2025 03:46 PM   Modules accepted: Orders

## 2025-01-02 ENCOUNTER — Encounter: Payer: Self-pay | Admitting: *Deleted

## 2025-01-02 ENCOUNTER — Other Ambulatory Visit: Payer: Self-pay | Admitting: *Deleted

## 2025-01-02 LAB — URINE CULTURE: Culture: >=100000 — AB

## 2025-01-02 MED ORDER — CIPROFLOXACIN HCL 500 MG PO TABS: 500.0000 mg | ORAL_TABLET | Freq: Two times a day (BID) | ORAL | 0 refills | Status: AC

## 2025-01-03 LAB — BCR-ABL1, CML/ALL, PCR, QUANT
E1A2 Transcript: 0.007 %
b2a2 transcript: <0.0032 %
b3a2 transcript: <0.0032 %

## 2025-01-07 ENCOUNTER — Telehealth: Payer: Self-pay | Admitting: Family Medicine

## 2025-01-07 NOTE — Telephone Encounter (Signed)
 Called to change appt on 01/08/25 to virtual per Redell Corn   If patient returns call and is unable to do a virtual visit, please make appt for next Tuesday

## 2025-01-08 ENCOUNTER — Institutional Professional Consult (permissible substitution): Payer: Self-pay | Admitting: Professional Counselor

## 2025-01-08 NOTE — Telephone Encounter (Unsigned)
 Copied from CRM #[id number]. Topic: Appointments - Scheduling Inquiry for Clinic >> Jan 07, 2025  8:23 AM Wyona P wrote: Reason for CRM: PT called in about conselor appt scheudled on 01/08/25 with Redell. PT called in to say she wont be able to leave her house and will not be able to make appt.   Please call pt to rescheudle.    ----------------------------------------------------------------------- From previous Reason for Contact - Cancel/Reschedule: Patient/patient representative is calling to cancel or reschedule an appointment. Refer to attachments for appointment information.

## 2025-01-08 NOTE — Telephone Encounter (Signed)
 Please reschedule

## 2025-01-11 ENCOUNTER — Telehealth: Payer: Self-pay

## 2025-01-11 ENCOUNTER — Other Ambulatory Visit (HOSPITAL_COMMUNITY): Payer: Self-pay

## 2025-01-11 NOTE — Telephone Encounter (Signed)
 Pharmacy Patient Advocate Encounter   Received notification from Onbase CMM KEY that prior authorization for LIDOCAINE  5% PATCHES is required/requested.   Insurance verification completed.   The patient is insured through [facility] ADVANTAGE/RX ADVANCE.   Per test claim: Medication not covered unless treating pain associated with post-herpetic neuralgia.  All other diagnoses not coverable under Part D.  Current dx: Chronic mid back pain   Lidocaine  4% patches available OTC  Archived key [id number]

## 2025-01-12 ENCOUNTER — Other Ambulatory Visit: Payer: Self-pay | Admitting: "Endocrinology

## 2025-01-12 DIAGNOSIS — E039 Hypothyroidism, unspecified: Secondary | ICD-10-CM

## 2025-01-15 ENCOUNTER — Ambulatory Visit: Payer: Self-pay | Admitting: Professional Counselor

## 2025-01-15 NOTE — BH Specialist Note (Unsigned)
 Collaborative Care Initial Assessment  Session Start time: 11:00 am   Session End time: 12:00 pm  Total time in minutes: 60 min   Type of Contact:  Face to Face Patient consent obtained:  Yes Types of Service: Collaborative care  Summary  Patient is a 79 yo female being referred to collaborative care by her pcp for anxiety and depression. Patient was engaged and cooperative during session.   Reason for referral in patient/family's own words:  I lost my husband  Patient's goal for today's visit: I don't know what to do  History of Present illness:   Husband died after 20 years of marriage. Chronic lukumina for 9 years and he was her rock, taking care of her then he found out he had kidney cancer and was gone within 6 months been difficult transition. Can't sleep, crying a lot, snacking, stressed stopped going to church and her cancer group, feeling alone, needs more help,  was left with all the medical bills and a farm and cabin to have to [place] everything. 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 She needs the money for the bills,.   Clinical Assessment   PHQ-9 Assessments:    01/15/2025   11:26 AM 01/01/2025   10:36 AM 12/03/2024    9:57 AM 11/28/2024   12:09 PM 10/26/2024    2:09 PM  Depression screen PHQ 2/9  Decreased Interest 1 1 0 1 1  Down, Depressed, Hopeless 1 1 0 1 0  PHQ - 2 Score 2 2 0 2 1  Altered sleeping 1 1 0 1 1  Tired, decreased energy 1 1 0 1 1  Change in appetite 0 1 0 1 0  Feeling bad or failure about yourself  0 0 0 0 0  Trouble concentrating 0 0 0 0 1  Moving slowly or fidgety/restless 0 0 0 0 0  Suicidal thoughts 0 0 0 0 0  PHQ-9 Score 4 5 0 5 4  Difficult doing work/chores Somewhat difficult Not difficult at all  Not difficult at all Not difficult at all    GAD-7 Assessments:    01/15/2025   11:26 AM 01/01/2025   10:37 AM 11/28/2024   12:10 PM 10/26/2024    2:09 PM  GAD 7 : Generalized Anxiety Score  Nervous, Anxious,  on Edge 1 0 1  0   Control/stop worrying 1 1 1   0   Worry too much - different things 1 1 1  1    Trouble relaxing 0 1 1  0   Restless 0 0 0  0   Easily annoyed or irritable 0 0 0  0   Afraid - awful might happen 0 0 1  0   Total GAD 7 Score 3 3 5 1   Anxiety Difficulty Somewhat difficult Not difficult at all Not difficult at all Not difficult at all     Data saved with a previous flowsheet row definition     Social History:  Household: Lives with son Marital status: widowed Number of Children: 2 Employment: retired Education: Automotive Engineer  Psychiatric Review of systems: Insomnia: Can't sleep Changes in appetite: Snacking Decreased need for sleep: No Family history of bipolar disorder: No Hallucinations: No   Paranoia: No    Psychotropic medications: Current medications: Pristiq  Patient taking medications as prescribed:  Yes Side effects reported: Denies  Current medications (medication list) Medications Ordered Prior to Encounter[1]  Psychiatric History: Past psychiatry diagnosis: None reported  Patient currently  being seen by therapist/psychiatrist: Denies Prior Suicide Attempts: denies Past psychiatry Hospitalization(s): Denies Past history of violence: Denies  Traumatic Experiences: History or current traumatic events (natural disaster, house fire, etc.)? no History or current physical trauma?  yes History or current emotional trauma?  yes History or current sexual trauma?  Did not answer History or current domestic or intimate partner violence?  no PTSD symptoms if any traumatic experiences yes nightmares, flashbacks,   Alcohol and/or Substance Use History   Tobacco Alcohol Other substances  Current use  Denies Denies  Past use     Past treatment      Withdrawal Potential:   Self-harm Behaviors Risk Assessment Self-harm risk factors: Depression, grief, aging Patient endorses recent thoughts of harming self: Denies [place] Suicide Severity Rating Scale:   Flowsheet Row ED from 05/23/2024 in [facility] at [facility] Admission (Discharged) from 06/29/2023 in [facility] Pre-Admission Testing 59 from 06/23/2023 in [facility]PRE[facility] TEST[facility]  C-SSRS RISK CATEGORY No Risk No Risk No Risk     Guns in the home:     Protective factors: Family support, religion, hopefulness  Danger to Others Risk Assessment Danger to others risk factors:  None Patient endorses recent thoughts of harming others: Denies   Consulting Civil Engineer discussed emergency crisis plan with client and provided local emergency services resources.  Mental status exam:   General Appearance Siegfried:  Casual Eye Contact:  Good Motor Behavior:  Normal Speech:  Normal Level of Consciousness:  Alert Mood:  Depressed Affect:  Depressed Anxiety Level:  Minimal Thought Process:  Coherent Thought Content:  WNL Perception:  Normal Judgment:  Good Insight:  Present  Diagnosis:   Goals: Increase adequate support systems for patient/family   Interventions: Mindfulness or Relaxation Training, Behavioral Activation, and Medication Monitoring   Follow-up Plan: Refer to [facility] Outpatient Therapy     [1]  Current Outpatient Medications on File Prior to Visit  Medication Sig Dispense Refill   Acetaminophen  (ACETAMIN PO) Take by mouth as needed.     albuterol  (VENTOLIN  HFA) 108 (90 Base) MCG/ACT inhaler Inhale 2 puffs into the lungs every 6 (six) hours as needed for wheezing or shortness of breath. 8 g 0   allopurinol (ZYLOPRIM) 100 MG tablet Take 100 mg by mouth daily.     cetirizine (ZYRTEC) 10 MG tablet Take 10 mg by mouth daily.     cholecalciferol  (VITAMIN D3) 25 MCG (1000 UNIT) tablet Take 4,000 Units by mouth daily.     ciprofloxacin  (CIPRO ) 500 MG tablet Take 1 tablet (500 mg total) by mouth 2 (two) times daily. 10 tablet 0   desvenlafaxine  (PRISTIQ ) 50 MG 24 hr tablet Take 1 tablet (50 mg total) by mouth  daily. 90 tablet 3   hydrOXYzine  (VISTARIL ) 25 MG capsule Take 1 capsule (25 mg total) by mouth at bedtime as needed and may repeat dose one time if needed (sleep). 60 capsule 0   lactose free nutrition (BOOST) LIQD Take 237 mLs by mouth 3 (three) times daily between meals.     levothyroxine  (SYNTHROID ) 75 MCG tablet TAKE 1 TABLET BY MOUTH DAILY BEFORE BREAKFAST. 90 tablet 1   lidocaine  (LIDODERM ) 5 % Place 1 patch onto the skin daily. Remove & Discard patch within 12 hours or as directed by MD 30 patch 0   mirabegron  ER (MYRBETRIQ ) 25 MG TB24 tablet Take 1 tablet (25 mg total) by mouth daily. 30 tablet 11   Multiple Vitamins-Minerals (MULTIVITAMIN WOMEN PO)  Take 1 tablet by mouth daily.     nilotinib  (TASIGNA ) 150 MG capsule Take 2 capsules (300 mg total) by mouth every 12 (twelve) hours. 120 capsule 2   ondansetron  (ZOFRAN ) 4 MG tablet Take 1 tablet (4 mg total) by mouth every 8 (eight) hours as needed for nausea or vomiting. 60 tablet 3   Probiotic Product (DIGESTIVE ADV PREBIOT+PROBIOT) CHEW Chew 2 each by mouth daily.     rosuvastatin  (CRESTOR ) 5 MG tablet Take 1 tablet (5 mg total) by mouth at bedtime. 90 tablet 1   traMADol  (ULTRAM ) 50 MG tablet Take 1 tablet (50 mg total) by mouth at bedtime. 30 tablet 0   No current facility-administered medications on file prior to visit.

## 2025-01-22 ENCOUNTER — Ambulatory Visit: Payer: Self-pay | Admitting: Professional Counselor

## 2025-01-25 ENCOUNTER — Inpatient Hospital Stay

## 2025-02-01 ENCOUNTER — Inpatient Hospital Stay

## 2025-02-01 ENCOUNTER — Inpatient Hospital Stay: Admitting: Oncology

## 2025-03-04 ENCOUNTER — Ambulatory Visit: Admitting: "Endocrinology

## 2025-05-01 ENCOUNTER — Ambulatory Visit: Admitting: Family Medicine

## 2025-05-24 ENCOUNTER — Ambulatory Visit: Payer: Self-pay

## 2025-06-06 ENCOUNTER — Ambulatory Visit
